# Patient Record
Sex: Male | Born: 1935 | Race: White | Hispanic: No | Marital: Married | Smoking: Former smoker
Health system: Southern US, Community
[De-identification: ages and names within clinical notes are randomized; demographics above are authoritative.]

## PROBLEM LIST (undated history)

## (undated) DIAGNOSIS — J40 Bronchitis, not specified as acute or chronic: Secondary | ICD-10-CM

## (undated) DIAGNOSIS — R404 Transient alteration of awareness: Secondary | ICD-10-CM

## (undated) DIAGNOSIS — Z9981 Dependence on supplemental oxygen: Secondary | ICD-10-CM

## (undated) DIAGNOSIS — I629 Nontraumatic intracranial hemorrhage, unspecified: Secondary | ICD-10-CM

## (undated) DIAGNOSIS — F329 Major depressive disorder, single episode, unspecified: Secondary | ICD-10-CM

## (undated) DIAGNOSIS — M810 Age-related osteoporosis without current pathological fracture: Secondary | ICD-10-CM

## (undated) DIAGNOSIS — Z973 Presence of spectacles and contact lenses: Secondary | ICD-10-CM

## (undated) DIAGNOSIS — D179 Benign lipomatous neoplasm, unspecified: Secondary | ICD-10-CM

## (undated) DIAGNOSIS — I4891 Unspecified atrial fibrillation: Secondary | ICD-10-CM

## (undated) DIAGNOSIS — M4850XA Collapsed vertebra, not elsewhere classified, site unspecified, initial encounter for fracture: Secondary | ICD-10-CM

## (undated) DIAGNOSIS — F32A Depression, unspecified: Secondary | ICD-10-CM

## (undated) DIAGNOSIS — M1990 Unspecified osteoarthritis, unspecified site: Secondary | ICD-10-CM

## (undated) DIAGNOSIS — K219 Gastro-esophageal reflux disease without esophagitis: Secondary | ICD-10-CM

## (undated) DIAGNOSIS — G609 Hereditary and idiopathic neuropathy, unspecified: Secondary | ICD-10-CM

## (undated) DIAGNOSIS — G629 Polyneuropathy, unspecified: Secondary | ICD-10-CM

## (undated) DIAGNOSIS — R413 Other amnesia: Secondary | ICD-10-CM

## (undated) DIAGNOSIS — G4733 Obstructive sleep apnea (adult) (pediatric): Secondary | ICD-10-CM

## (undated) DIAGNOSIS — J449 Chronic obstructive pulmonary disease, unspecified: Secondary | ICD-10-CM

## (undated) DIAGNOSIS — M797 Fibromyalgia: Secondary | ICD-10-CM

## (undated) DIAGNOSIS — I482 Chronic atrial fibrillation, unspecified: Secondary | ICD-10-CM

## (undated) DIAGNOSIS — E119 Type 2 diabetes mellitus without complications: Secondary | ICD-10-CM

## (undated) DIAGNOSIS — G4730 Sleep apnea, unspecified: Secondary | ICD-10-CM

## (undated) DIAGNOSIS — H269 Unspecified cataract: Secondary | ICD-10-CM

## (undated) DIAGNOSIS — E039 Hypothyroidism, unspecified: Secondary | ICD-10-CM

## (undated) DIAGNOSIS — I4819 Other persistent atrial fibrillation: Secondary | ICD-10-CM

## (undated) DIAGNOSIS — G4700 Insomnia, unspecified: Secondary | ICD-10-CM

## (undated) DIAGNOSIS — R0902 Hypoxemia: Secondary | ICD-10-CM

## (undated) DIAGNOSIS — I10 Essential (primary) hypertension: Secondary | ICD-10-CM

## (undated) DIAGNOSIS — M75102 Unspecified rotator cuff tear or rupture of left shoulder, not specified as traumatic: Secondary | ICD-10-CM

## (undated) DIAGNOSIS — N400 Enlarged prostate without lower urinary tract symptoms: Secondary | ICD-10-CM

## (undated) HISTORY — PX: VASECTOMY: SHX75

## (undated) HISTORY — DX: Age-related osteoporosis without current pathological fracture: M81.0

## (undated) HISTORY — DX: Other persistent atrial fibrillation: I48.19

## (undated) HISTORY — PX: CATARACT EXTRACTION: SUR2

## (undated) HISTORY — DX: Major depressive disorder, single episode, unspecified: F32.9

## (undated) HISTORY — DX: Transient alteration of awareness: R40.4

## (undated) HISTORY — DX: Obstructive sleep apnea (adult) (pediatric): G47.33

## (undated) HISTORY — PX: OTHER SURGICAL HISTORY: SHX169

## (undated) HISTORY — PX: CARPAL TUNNEL RELEASE: SHX101

## (undated) HISTORY — DX: Other amnesia: R41.3

## (undated) HISTORY — PX: INGUINAL HERNIA REPAIR: SUR1180

## (undated) HISTORY — PX: CHOLECYSTECTOMY: SHX55

## (undated) HISTORY — PX: SHOULDER ARTHROSCOPY W/ ROTATOR CUFF REPAIR: SHX2400

## (undated) HISTORY — PX: APPENDECTOMY: SHX54

## (undated) HISTORY — DX: Chronic atrial fibrillation, unspecified: I48.20

## (undated) HISTORY — PX: PROSTATE SURGERY: SHX751

## (undated) HISTORY — DX: Unspecified atrial fibrillation: I48.91

## (undated) HISTORY — DX: Hereditary and idiopathic neuropathy, unspecified: G60.9

## (undated) HISTORY — DX: Depression, unspecified: F32.A

## (undated) HISTORY — PX: NASAL CONCHA BULLOSA RESECTION: SHX2062

## (undated) HISTORY — DX: Unspecified cataract: H26.9

## (undated) HISTORY — DX: Nontraumatic intracranial hemorrhage, unspecified: I62.9

## (undated) HISTORY — PX: EYE SURGERY: SHX253

---

## 2011-05-05 LAB — PULMONARY FUNCTION TEST

## 2011-06-18 ENCOUNTER — Ambulatory Visit (INDEPENDENT_AMBULATORY_CARE_PROVIDER_SITE_OTHER): Payer: Medicare Other

## 2011-06-18 DIAGNOSIS — J189 Pneumonia, unspecified organism: Secondary | ICD-10-CM | POA: Diagnosis not present

## 2011-06-18 DIAGNOSIS — J449 Chronic obstructive pulmonary disease, unspecified: Secondary | ICD-10-CM | POA: Diagnosis not present

## 2011-06-18 DIAGNOSIS — M25519 Pain in unspecified shoulder: Secondary | ICD-10-CM | POA: Diagnosis not present

## 2011-06-18 DIAGNOSIS — J159 Unspecified bacterial pneumonia: Secondary | ICD-10-CM | POA: Diagnosis not present

## 2011-06-20 ENCOUNTER — Ambulatory Visit (INDEPENDENT_AMBULATORY_CARE_PROVIDER_SITE_OTHER): Payer: Medicare Other

## 2011-06-20 DIAGNOSIS — J449 Chronic obstructive pulmonary disease, unspecified: Secondary | ICD-10-CM | POA: Diagnosis not present

## 2011-06-20 DIAGNOSIS — J159 Unspecified bacterial pneumonia: Secondary | ICD-10-CM | POA: Diagnosis not present

## 2011-06-20 DIAGNOSIS — M25519 Pain in unspecified shoulder: Secondary | ICD-10-CM | POA: Diagnosis not present

## 2011-06-20 DIAGNOSIS — J189 Pneumonia, unspecified organism: Secondary | ICD-10-CM | POA: Diagnosis not present

## 2011-06-23 ENCOUNTER — Ambulatory Visit (INDEPENDENT_AMBULATORY_CARE_PROVIDER_SITE_OTHER): Payer: Medicare Other

## 2011-06-23 DIAGNOSIS — G479 Sleep disorder, unspecified: Secondary | ICD-10-CM

## 2011-06-23 DIAGNOSIS — J449 Chronic obstructive pulmonary disease, unspecified: Secondary | ICD-10-CM

## 2011-06-23 DIAGNOSIS — J189 Pneumonia, unspecified organism: Secondary | ICD-10-CM | POA: Diagnosis not present

## 2011-06-26 DIAGNOSIS — M25519 Pain in unspecified shoulder: Secondary | ICD-10-CM | POA: Diagnosis not present

## 2011-07-01 ENCOUNTER — Ambulatory Visit (INDEPENDENT_AMBULATORY_CARE_PROVIDER_SITE_OTHER): Payer: Medicare Other

## 2011-07-01 DIAGNOSIS — J209 Acute bronchitis, unspecified: Secondary | ICD-10-CM

## 2011-07-01 DIAGNOSIS — R059 Cough, unspecified: Secondary | ICD-10-CM | POA: Diagnosis not present

## 2011-07-01 DIAGNOSIS — R05 Cough: Secondary | ICD-10-CM | POA: Diagnosis not present

## 2011-07-03 DIAGNOSIS — M19019 Primary osteoarthritis, unspecified shoulder: Secondary | ICD-10-CM | POA: Diagnosis not present

## 2011-07-07 ENCOUNTER — Ambulatory Visit (INDEPENDENT_AMBULATORY_CARE_PROVIDER_SITE_OTHER): Payer: Medicare Other

## 2011-07-07 DIAGNOSIS — R071 Chest pain on breathing: Secondary | ICD-10-CM | POA: Diagnosis not present

## 2011-07-07 DIAGNOSIS — M8448XA Pathological fracture, other site, initial encounter for fracture: Secondary | ICD-10-CM

## 2011-07-07 DIAGNOSIS — J189 Pneumonia, unspecified organism: Secondary | ICD-10-CM | POA: Diagnosis not present

## 2011-07-13 DIAGNOSIS — S43429A Sprain of unspecified rotator cuff capsule, initial encounter: Secondary | ICD-10-CM | POA: Diagnosis not present

## 2011-07-16 DIAGNOSIS — M75120 Complete rotator cuff tear or rupture of unspecified shoulder, not specified as traumatic: Secondary | ICD-10-CM | POA: Diagnosis not present

## 2011-07-16 DIAGNOSIS — M25519 Pain in unspecified shoulder: Secondary | ICD-10-CM | POA: Diagnosis not present

## 2011-07-18 ENCOUNTER — Ambulatory Visit (INDEPENDENT_AMBULATORY_CARE_PROVIDER_SITE_OTHER): Payer: Medicare Other | Admitting: Family Medicine

## 2011-07-18 VITALS — BP 132/70 | HR 64 | Temp 98.0°F | Resp 16 | Ht 66.5 in | Wt 198.4 lb

## 2011-07-18 DIAGNOSIS — R062 Wheezing: Secondary | ICD-10-CM

## 2011-07-18 DIAGNOSIS — J449 Chronic obstructive pulmonary disease, unspecified: Secondary | ICD-10-CM | POA: Diagnosis not present

## 2011-07-18 DIAGNOSIS — J40 Bronchitis, not specified as acute or chronic: Secondary | ICD-10-CM

## 2011-07-18 DIAGNOSIS — R059 Cough, unspecified: Secondary | ICD-10-CM

## 2011-07-18 DIAGNOSIS — R05 Cough: Secondary | ICD-10-CM

## 2011-07-18 DIAGNOSIS — J4489 Other specified chronic obstructive pulmonary disease: Secondary | ICD-10-CM

## 2011-07-18 MED ORDER — HYDROCODONE-HOMATROPINE 5-1.5 MG/5ML PO SYRP: 5.0000 mL | ORAL_SOLUTION | Freq: Three times a day (TID) | ORAL | Status: AC | PRN

## 2011-07-18 MED ORDER — METHYLPREDNISOLONE SODIUM SUCC 125 MG IJ SOLR
125.0000 mg | Freq: Once | INTRAMUSCULAR | Status: AC
  Administered 2011-07-18: 125 mg via INTRAMUSCULAR

## 2011-07-18 MED ORDER — IPRATROPIUM BROMIDE 0.02 % IN SOLN
0.5000 mg | Freq: Once | RESPIRATORY_TRACT | Status: AC
  Administered 2011-07-18: 0.5 mg via RESPIRATORY_TRACT

## 2011-07-18 MED ORDER — IPRATROPIUM-ALBUTEROL 0.5-2.5 (3) MG/3ML IN SOLN: 3.0000 mL | RESPIRATORY_TRACT | Status: DC | PRN

## 2011-07-18 MED ORDER — ALBUTEROL SULFATE (2.5 MG/3ML) 0.083% IN NEBU
2.5000 mg | INHALATION_SOLUTION | Freq: Once | RESPIRATORY_TRACT | Status: AC
  Administered 2011-07-18: 2.5 mg via RESPIRATORY_TRACT

## 2011-07-18 MED ORDER — PREDNISONE 20 MG PO TABS: ORAL_TABLET | ORAL | Status: DC

## 2011-07-18 NOTE — Patient Instructions (Signed)
Patient was instructed verbally with regard to he has use of the nebulizer machine. He is to use it in place of the Combivent and he can use one or the other not both he is to return in one week

## 2011-07-18 NOTE — Progress Notes (Signed)
  Subjective:    Patient ID: Jonathan [name], male    DOB: Jul 21, 1932, 78 y.o.   MRN: [id number]  HPI has been having this cough for a month now. He has gone through 2 courses of antibiotics and been seen on multiple occasions. He did have a pneumonia, but the last x-ray looked much better on that. He is constantly coughing. He is scheduled to go back to [place] in about 10 days. He is scheduled to go to the beach tomorrow he is not running a fever. He has some productivity of the cough. He just finished his antibiotics one to 2 days ago. He coughs so much that he gets short of breath, but he has a pulse oximeter at home. The home pulse ox runs about 95. Denies any chest pains except if he coughs too hard.    Review of Systems   he has no headache. He has mild congestion of his nose. He has chronic postnasal drainage which she's had for years, but this is no worse than usual. No new ankle edema Objective:   Physical Exam  constant cough. He goes into spasm since it is hard to breathe at times. Normal throat was clear neck supple without nodes or thyromegaly chest has a soft end expiratory wheeze very poor air movement no rales or rhonchi. No edema.       Assessment & Plan:  COPD with exacerbation. Chronic cough with asthmatic bronchitis  . Patient had only a 200 peak flow here in the office. 3 weeks ago it was 270. He was given a hand-held nebulizer of albuterol and Atrovent. He improved at least to 40, but he kept coughing too much to adequately do the test. He was given an injection of 125 mg Solu-Medrol. He will be given a nebulizer machine at home.

## 2011-07-20 ENCOUNTER — Inpatient Hospital Stay (HOSPITAL_COMMUNITY)
Admission: EM | Admit: 2011-07-20 | Discharge: 2011-07-21 | DRG: 308 | Disposition: A | Discharge status: Home / Self Care | Payer: Medicare Other | Attending: Internal Medicine | Admitting: Internal Medicine

## 2011-07-20 ENCOUNTER — Encounter (HOSPITAL_COMMUNITY): Payer: Self-pay | Admitting: Emergency Medicine

## 2011-07-20 ENCOUNTER — Other Ambulatory Visit: Payer: Self-pay

## 2011-07-20 ENCOUNTER — Emergency Department (HOSPITAL_COMMUNITY): Payer: Medicare Other

## 2011-07-20 DIAGNOSIS — Z9981 Dependence on supplemental oxygen: Secondary | ICD-10-CM | POA: Diagnosis not present

## 2011-07-20 DIAGNOSIS — I129 Hypertensive chronic kidney disease with stage 1 through stage 4 chronic kidney disease, or unspecified chronic kidney disease: Secondary | ICD-10-CM | POA: Diagnosis present

## 2011-07-20 DIAGNOSIS — R079 Chest pain, unspecified: Secondary | ICD-10-CM | POA: Diagnosis present

## 2011-07-20 DIAGNOSIS — I4891 Unspecified atrial fibrillation: Secondary | ICD-10-CM | POA: Diagnosis not present

## 2011-07-20 DIAGNOSIS — R05 Cough: Secondary | ICD-10-CM

## 2011-07-20 DIAGNOSIS — E039 Hypothyroidism, unspecified: Secondary | ICD-10-CM | POA: Diagnosis present

## 2011-07-20 DIAGNOSIS — J449 Chronic obstructive pulmonary disease, unspecified: Secondary | ICD-10-CM | POA: Diagnosis not present

## 2011-07-20 DIAGNOSIS — N189 Chronic kidney disease, unspecified: Secondary | ICD-10-CM | POA: Diagnosis present

## 2011-07-20 DIAGNOSIS — R0602 Shortness of breath: Secondary | ICD-10-CM | POA: Diagnosis not present

## 2011-07-20 DIAGNOSIS — R059 Cough, unspecified: Secondary | ICD-10-CM

## 2011-07-20 DIAGNOSIS — D696 Thrombocytopenia, unspecified: Secondary | ICD-10-CM | POA: Diagnosis not present

## 2011-07-20 DIAGNOSIS — R918 Other nonspecific abnormal finding of lung field: Secondary | ICD-10-CM | POA: Diagnosis not present

## 2011-07-20 DIAGNOSIS — J4489 Other specified chronic obstructive pulmonary disease: Secondary | ICD-10-CM

## 2011-07-20 DIAGNOSIS — R7989 Other specified abnormal findings of blood chemistry: Secondary | ICD-10-CM | POA: Diagnosis not present

## 2011-07-20 DIAGNOSIS — J40 Bronchitis, not specified as acute or chronic: Secondary | ICD-10-CM

## 2011-07-20 DIAGNOSIS — J189 Pneumonia, unspecified organism: Secondary | ICD-10-CM | POA: Diagnosis not present

## 2011-07-20 DIAGNOSIS — I480 Paroxysmal atrial fibrillation: Secondary | ICD-10-CM

## 2011-07-20 HISTORY — DX: Essential (primary) hypertension: I10

## 2011-07-20 HISTORY — DX: Chronic obstructive pulmonary disease, unspecified: J44.9

## 2011-07-20 HISTORY — DX: Unspecified rotator cuff tear or rupture of left shoulder, not specified as traumatic: M75.102

## 2011-07-20 HISTORY — DX: Collapsed vertebra, not elsewhere classified, site unspecified, initial encounter for fracture: M48.50XA

## 2011-07-20 HISTORY — DX: Polyneuropathy, unspecified: G62.9

## 2011-07-20 HISTORY — DX: Insomnia, unspecified: G47.00

## 2011-07-20 HISTORY — DX: Hypothyroidism, unspecified: E03.9

## 2011-07-20 HISTORY — DX: Hypoxemia: R09.02

## 2011-07-20 HISTORY — DX: Benign lipomatous neoplasm, unspecified: D17.9

## 2011-07-20 HISTORY — DX: Benign prostatic hyperplasia without lower urinary tract symptoms: N40.0

## 2011-07-20 HISTORY — DX: Unspecified osteoarthritis, unspecified site: M19.90

## 2011-07-20 HISTORY — DX: Bronchitis, not specified as acute or chronic: J40

## 2011-07-20 HISTORY — DX: Fibromyalgia: M79.7

## 2011-07-20 HISTORY — DX: Gastro-esophageal reflux disease without esophagitis: K21.9

## 2011-07-20 LAB — POCT I-STAT TROPONIN I: Troponin i, poc: 0.01 ng/mL (ref 0.00–0.08)

## 2011-07-20 LAB — CBC
HCT: 36.4 % — ABNORMAL LOW (ref 39.0–52.0)
Hemoglobin: 12.1 g/dL — ABNORMAL LOW (ref 13.0–17.0)
MCH: 31.3 pg (ref 26.0–34.0)
MCHC: 33.2 g/dL (ref 30.0–36.0)
MCV: 94.3 fL (ref 78.0–100.0)
Platelets: 149 10*3/uL — ABNORMAL LOW (ref 150–400)
RBC: 3.86 MIL/uL — ABNORMAL LOW (ref 4.22–5.81)
RDW: 14.5 % (ref 11.5–15.5)
WBC: 13.3 10*3/uL — ABNORMAL HIGH (ref 4.0–10.5)

## 2011-07-20 LAB — GLUCOSE, CAPILLARY: Glucose-Capillary: 113 mg/dL — ABNORMAL HIGH (ref 70–99)

## 2011-07-20 LAB — POCT I-STAT, CHEM 8
BUN: 55 mg/dL — ABNORMAL HIGH (ref 6–23)
Calcium, Ion: 1.22 mmol/L (ref 1.12–1.32)
Chloride: 110 mEq/L (ref 96–112)
Creatinine, Ser: 1.6 mg/dL — ABNORMAL HIGH (ref 0.50–1.35)
Glucose, Bld: 227 mg/dL — ABNORMAL HIGH (ref 70–99)
HCT: 38 % — ABNORMAL LOW (ref 39.0–52.0)
Hemoglobin: 12.9 g/dL — ABNORMAL LOW (ref 13.0–17.0)
Potassium: 4 mEq/L (ref 3.5–5.1)
Sodium: 140 mEq/L (ref 135–145)
TCO2: 20 mmol/L (ref 0–100)

## 2011-07-20 LAB — COMPREHENSIVE METABOLIC PANEL
ALT: 21 U/L (ref 0–53)
AST: 24 U/L (ref 0–37)
Albumin: 3.9 g/dL (ref 3.5–5.2)
Alkaline Phosphatase: 75 U/L (ref 39–117)
BUN: 55 mg/dL — ABNORMAL HIGH (ref 6–23)
CO2: 21 mEq/L (ref 19–32)
Calcium: 9.2 mg/dL (ref 8.4–10.5)
Chloride: 102 mEq/L (ref 96–112)
Creatinine, Ser: 1.66 mg/dL — ABNORMAL HIGH (ref 0.50–1.35)
GFR calc Af Amer: 45 mL/min — ABNORMAL LOW (ref >90)
GFR calc non Af Amer: 38 mL/min — ABNORMAL LOW (ref >90)
Glucose, Bld: 228 mg/dL — ABNORMAL HIGH (ref 70–99)
Potassium: 4 mEq/L (ref 3.5–5.1)
Sodium: 138 mEq/L (ref 135–145)
Total Bilirubin: 0.2 mg/dL — ABNORMAL LOW (ref 0.3–1.2)
Total Protein: 6.6 g/dL (ref 6.0–8.3)

## 2011-07-20 LAB — PROTIME-INR
INR: 1 (ref 0.00–1.49)
Prothrombin Time: 13.4 seconds (ref 11.6–15.2)

## 2011-07-20 LAB — CARDIAC PANEL(CRET KIN+CKTOT+MB+TROPI)
CK, MB: 8.7 ng/mL (ref 0.3–4.0)
Relative Index: 2.6 — ABNORMAL HIGH (ref 0.0–2.5)
Total CK: 333 U/L — ABNORMAL HIGH (ref 7–232)
Troponin I: <0.3 ng/mL (ref <0.30)

## 2011-07-20 LAB — CREATININE, SERUM
Creatinine, Ser: 1.56 mg/dL — ABNORMAL HIGH (ref 0.50–1.35)
GFR calc Af Amer: 48 mL/min — ABNORMAL LOW (ref >90)
GFR calc non Af Amer: 41 mL/min — ABNORMAL LOW (ref >90)

## 2011-07-20 LAB — APTT: aPTT: 25 seconds (ref 24–37)

## 2011-07-20 LAB — D-DIMER, QUANTITATIVE (NOT AT ARMC): D-Dimer, Quant: 0.32 ug/mL-FEU (ref 0.00–0.48)

## 2011-07-20 MED ORDER — PREGABALIN 50 MG PO CAPS
75.0000 mg | ORAL_CAPSULE | ORAL | Status: DC
  Administered 2011-07-21: 75 mg via ORAL
  Filled 2011-07-20: qty 3

## 2011-07-20 MED ORDER — INSULIN ASPART 100 UNIT/ML ~~LOC~~ SOLN
0.0000 [IU] | Freq: Three times a day (TID) | SUBCUTANEOUS | Status: DC
  Filled 2011-07-20: qty 3

## 2011-07-20 MED ORDER — PANTOPRAZOLE SODIUM 40 MG PO TBEC
40.0000 mg | DELAYED_RELEASE_TABLET | Freq: Every day | ORAL | Status: DC
  Administered 2011-07-21: 40 mg via ORAL
  Filled 2011-07-20: qty 1

## 2011-07-20 MED ORDER — IBUPROFEN 400 MG PO TABS
400.0000 mg | ORAL_TABLET | Freq: Two times a day (BID) | ORAL | Status: DC | PRN
  Administered 2011-07-21: 400 mg via ORAL
  Filled 2011-07-20 (×2): qty 1

## 2011-07-20 MED ORDER — SODIUM CHLORIDE 0.9 % IV SOLN
INTRAVENOUS | Status: DC
  Administered 2011-07-20 – 2011-07-21 (×2): via INTRAVENOUS

## 2011-07-20 MED ORDER — DIPHENHYDRAMINE HCL (SLEEP) 25 MG PO TABS: 50.0000 mg | ORAL_TABLET | Freq: Every evening | ORAL | Status: DC | PRN

## 2011-07-20 MED ORDER — SODIUM CHLORIDE 0.9 % IV SOLN: 20.0000 mL | INTRAVENOUS | Status: DC

## 2011-07-20 MED ORDER — TEMAZEPAM 15 MG PO CAPS
15.0000 mg | ORAL_CAPSULE | Freq: Every evening | ORAL | Status: DC | PRN
  Administered 2011-07-20: 15 mg via ORAL
  Filled 2011-07-20: qty 1

## 2011-07-20 MED ORDER — FLUTICASONE PROPIONATE 50 MCG/ACT NA SUSP
2.0000 | Freq: Every day | NASAL | Status: DC
  Administered 2011-07-21: 2 via NASAL
  Filled 2011-07-20: qty 16

## 2011-07-20 MED ORDER — DEXTROSE 5 % IV SOLN
1.0000 g | INTRAVENOUS | Status: DC
  Administered 2011-07-20: 1 g via INTRAVENOUS
  Filled 2011-07-20: qty 10

## 2011-07-20 MED ORDER — OXYCODONE-ACETAMINOPHEN 7.5-325 MG PO TABS: 1.0000 | ORAL_TABLET | ORAL | Status: DC | PRN

## 2011-07-20 MED ORDER — ASPIRIN 81 MG PO CHEW
324.0000 mg | CHEWABLE_TABLET | Freq: Once | ORAL | Status: DC
  Filled 2011-07-20: qty 4

## 2011-07-20 MED ORDER — IPRATROPIUM BROMIDE 0.02 % IN SOLN
0.5000 mg | Freq: Four times a day (QID) | RESPIRATORY_TRACT | Status: DC
  Administered 2011-07-20 – 2011-07-21 (×2): 0.5 mg via RESPIRATORY_TRACT
  Filled 2011-07-20 (×4): qty 2.5

## 2011-07-20 MED ORDER — ONDANSETRON HCL 4 MG/2ML IJ SOLN: 4.0000 mg | Freq: Four times a day (QID) | INTRAMUSCULAR | Status: DC | PRN

## 2011-07-20 MED ORDER — LEVALBUTEROL HCL 0.63 MG/3ML IN NEBU: 0.6300 mg | INHALATION_SOLUTION | Freq: Four times a day (QID) | RESPIRATORY_TRACT | Status: DC | PRN

## 2011-07-20 MED ORDER — ONDANSETRON HCL 4 MG PO TABS: 4.0000 mg | ORAL_TABLET | Freq: Four times a day (QID) | ORAL | Status: DC | PRN

## 2011-07-20 MED ORDER — MOXIFLOXACIN HCL IN NACL 400 MG/250ML IV SOLN
400.0000 mg | INTRAVENOUS | Status: DC
  Filled 2011-07-20: qty 250

## 2011-07-20 MED ORDER — PREGABALIN 50 MG PO CAPS
150.0000 mg | ORAL_CAPSULE | Freq: Every evening | ORAL | Status: DC
  Administered 2011-07-20: 150 mg via ORAL
  Filled 2011-07-20: qty 3

## 2011-07-20 MED ORDER — ALLOPURINOL 100 MG PO TABS
100.0000 mg | ORAL_TABLET | Freq: Two times a day (BID) | ORAL | Status: DC
  Administered 2011-07-20 – 2011-07-21 (×2): 100 mg via ORAL
  Filled 2011-07-20 (×3): qty 1

## 2011-07-20 MED ORDER — ACETAMINOPHEN 325 MG PO TABS: 650.0000 mg | ORAL_TABLET | Freq: Four times a day (QID) | ORAL | Status: DC | PRN

## 2011-07-20 MED ORDER — SODIUM CHLORIDE 0.9 % IV SOLN: INTRAVENOUS | Status: DC

## 2011-07-20 MED ORDER — ACETAMINOPHEN 650 MG RE SUPP: 650.0000 mg | Freq: Four times a day (QID) | RECTAL | Status: DC | PRN

## 2011-07-20 MED ORDER — PREGABALIN 50 MG PO CAPS: 75.0000 mg | ORAL_CAPSULE | Freq: Two times a day (BID) | ORAL | Status: DC

## 2011-07-20 MED ORDER — CALCIUM CARBONATE-VITAMIN D 500-200 MG-UNIT PO TABS
1.0000 | ORAL_TABLET | Freq: Every day | ORAL | Status: DC
  Administered 2011-07-20 – 2011-07-21 (×2): 1 via ORAL
  Filled 2011-07-20 (×2): qty 1

## 2011-07-20 MED ORDER — LISINOPRIL 20 MG PO TABS
20.0000 mg | ORAL_TABLET | Freq: Every day | ORAL | Status: DC
  Administered 2011-07-21: 20 mg via ORAL
  Filled 2011-07-20 (×2): qty 1

## 2011-07-20 MED ORDER — OXYCODONE-ACETAMINOPHEN 5-325 MG PO TABS: 1.5000 | ORAL_TABLET | ORAL | Status: DC | PRN

## 2011-07-20 MED ORDER — MOXIFLOXACIN HCL IN NACL 400 MG/250ML IV SOLN
400.0000 mg | Freq: Once | INTRAVENOUS | Status: AC
  Administered 2011-07-20: 400 mg via INTRAVENOUS
  Filled 2011-07-20: qty 250

## 2011-07-20 MED ORDER — SERTRALINE HCL 100 MG PO TABS
100.0000 mg | ORAL_TABLET | Freq: Every day | ORAL | Status: DC
  Filled 2011-07-20: qty 1

## 2011-07-20 MED ORDER — DOXAZOSIN MESYLATE 8 MG PO TABS
8.0000 mg | ORAL_TABLET | Freq: Every day | ORAL | Status: DC
  Administered 2011-07-20: 8 mg via ORAL
  Filled 2011-07-20 (×2): qty 1

## 2011-07-20 MED ORDER — LEVOTHYROXINE SODIUM 75 MCG PO TABS
75.0000 ug | ORAL_TABLET | Freq: Every day | ORAL | Status: DC
  Administered 2011-07-21: 75 ug via ORAL
  Filled 2011-07-20 (×2): qty 1

## 2011-07-20 MED ORDER — CYCLOBENZAPRINE HCL 10 MG PO TABS
10.0000 mg | ORAL_TABLET | Freq: Three times a day (TID) | ORAL | Status: DC | PRN
  Filled 2011-07-20: qty 1

## 2011-07-20 MED ORDER — VANCOMYCIN HCL 1000 MG IV SOLR
750.0000 mg | Freq: Two times a day (BID) | INTRAVENOUS | Status: DC
  Administered 2011-07-20 – 2011-07-21 (×2): 750 mg via INTRAVENOUS
  Filled 2011-07-20 (×3): qty 750

## 2011-07-20 MED ORDER — DIPHENHYDRAMINE HCL 25 MG PO CAPS: 50.0000 mg | ORAL_CAPSULE | Freq: Every evening | ORAL | Status: DC | PRN

## 2011-07-20 MED ORDER — ENOXAPARIN SODIUM 30 MG/0.3ML ~~LOC~~ SOLN
30.0000 mg | SUBCUTANEOUS | Status: DC
  Administered 2011-07-20: 30 mg via SUBCUTANEOUS
  Filled 2011-07-20 (×2): qty 0.3

## 2011-07-20 MED ORDER — BIOTENE DRY MOUTH MT LIQD
15.0000 mL | Freq: Two times a day (BID) | OROMUCOSAL | Status: DC
  Administered 2011-07-21: 15 mL via OROMUCOSAL

## 2011-07-20 MED ORDER — LATANOPROST 0.005 % OP SOLN
1.0000 [drp] | Freq: Every day | OPHTHALMIC | Status: DC
  Administered 2011-07-20: 1 [drp] via OPHTHALMIC
  Filled 2011-07-20: qty 2.5

## 2011-07-20 MED ORDER — SERTRALINE HCL 100 MG PO TABS
100.0000 mg | ORAL_TABLET | Freq: Every day | ORAL | Status: DC
  Administered 2011-07-20: 100 mg via ORAL
  Filled 2011-07-20: qty 1

## 2011-07-20 MED ORDER — LEVALBUTEROL HCL 0.63 MG/3ML IN NEBU
0.6300 mg | INHALATION_SOLUTION | Freq: Four times a day (QID) | RESPIRATORY_TRACT | Status: DC
  Administered 2011-07-21 (×2): 0.63 mg via RESPIRATORY_TRACT
  Filled 2011-07-20 (×7): qty 3

## 2011-07-20 MED ORDER — BUDESONIDE 0.5 MG/2ML IN SUSP
0.5000 mg | Freq: Two times a day (BID) | RESPIRATORY_TRACT | Status: DC
  Administered 2011-07-21: 0.5 mg via RESPIRATORY_TRACT
  Filled 2011-07-20 (×4): qty 2

## 2011-07-20 MED ORDER — ASPIRIN EC 325 MG PO TBEC
325.0000 mg | DELAYED_RELEASE_TABLET | Freq: Every day | ORAL | Status: DC
  Administered 2011-07-21: 325 mg via ORAL
  Filled 2011-07-20: qty 1

## 2011-07-20 NOTE — Progress Notes (Signed)
CRITICAL VALUE ALERT  Critical value received:  CK-MB 8.7   Date of notification:  07/20/11  Time of notification:  2300  Critical value read back:yes  Nurse who received alert:  Duwaine Maxin, RN  MD notified (1st page):  Maren Reamer, NP  Time of first page:  2300  MD notified (2nd page):  Time of second page:  Responding MD:  Maren Reamer, NP  Time MD responded:  [phone]

## 2011-07-20 NOTE — ED Notes (Signed)
Pt st's he has had Bronchitis since Jan 1.  Today started having chest pain across upper chest.    Pt also has productive cough.

## 2011-07-20 NOTE — ED Provider Notes (Signed)
History     CSN: [id number]  Arrival date & time 07/20/11  1721   First MD Initiated Contact with Patient 07/20/11 1746      Chief Complaint  Patient presents with  . Chest Pain    (Consider location/radiation/quality/duration/timing/severity/associated sxs/prior treatment) HPI Pt presents to the ED with complaints of palpitations.  He feels like it has been frequent since starting the nebulizer machine.  He has been on that for a few days.  He has been having pain in his chest with this.  Pt saw his primary doctor and was started on the nebulizer medication.  The pain in the chest was pressure like.  No radiation of the pain.  He did get short of breath with it when it occurs.  Pt denies history of CAD.   Past Medical History  Diagnosis Date  . Bronchitis   . COPD (chronic obstructive pulmonary disease)   . Hypertension     No past surgical history on file.  No family history on file.  History  Substance Use Topics  . Smoking status: Former Smoker    Quit date: 07/18/1975  . Smokeless tobacco: Not on file  . Alcohol Use: Not on file      Review of Systems  All other systems reviewed and are negative.    Allergies  Review of patient's allergies indicates no known allergies.  Home Medications   Current Outpatient Rx  Name Route Sig Dispense Refill  . IPRATROPIUM-ALBUTEROL 18-103 MCG/ACT IN AERO Inhalation Inhale 2 puffs into the lungs every 6 (six) hours as needed. For shortness of breath    . ALLOPURINOL 100 MG PO TABS Oral Take 100 mg by mouth 2 (two) times daily.    . ASPIRIN 81 MG PO TABS Oral Take 160 mg by mouth daily.    Marland Kitchen BENZONATATE 100 MG PO CAPS Oral Take 100 mg by mouth 3 (three) times daily as needed. 1 to 2 pills tid    . CALCIUM CARBONATE-VITAMIN D 500-200 MG-UNIT PO TABS Oral Take 1 tablet by mouth daily.    Marland Kitchen CEFDINIR 300 MG PO CAPS Oral Take 300 mg by mouth 2 (two) times daily.    . CYCLOBENZAPRINE HCL 10 MG PO TABS Oral Take 10 mg by mouth 3  (three) times daily as needed.    Marland Kitchen DIPHENHYDRAMINE HCL (SLEEP) 25 MG PO TABS Oral Take 50 mg by mouth at bedtime as needed.    Marland Kitchen DOXAZOSIN MESYLATE 8 MG PO TABS Oral Take 8 mg by mouth at bedtime.    Marland Kitchen GLUCOSAMINE-CHONDROITIN 500-400 MG PO TABS Oral Take 1 tablet by mouth 3 (three) times daily.    Marland Kitchen HYDROCODONE-HOMATROPINE 5-1.5 MG/5ML PO SYRP Oral Take 5 mLs by mouth every 8 (eight) hours as needed for cough. 240 mL 1  . IBUPROFEN 400 MG PO TABS Oral Take 400 mg by mouth every 6 (six) hours as needed.    . IPRATROPIUM-ALBUTEROL 0.5-2.5 (3) MG/3ML IN SOLN Nebulization Take 3 mLs by nebulization every 4 (four) hours as needed. 3 mL 3    Prescribe 2 boxes with 3 refills  . LANSOPRAZOLE 30 MG PO CPDR Oral Take 30 mg by mouth every evening.    Marland Kitchen LATANOPROST 0.005 % OP SOLN  1 drop at bedtime.    Marland Kitchen LEVOFLOXACIN 500 MG PO TABS Oral Take 500 mg by mouth daily.    Marland Kitchen LEVOTHYROXINE SODIUM 75 MCG PO TABS Oral Take 75 mcg by mouth daily.    Marland Kitchen LISINOPRIL-HYDROCHLOROTHIAZIDE  20-25 MG PO TABS Oral Take 1 tablet by mouth daily.    Marland Kitchen MAGNESIUM HYDROXIDE 400 MG/5ML PO SUSP Oral Take by mouth daily as needed.    . MOMETASONE FUROATE 50 MCG/ACT NA SUSP Nasal Place 2 sprays into the nose daily.    . OXYCODONE-ACETAMINOPHEN 7.5-325 MG PO TABS Oral Take 1 tablet by mouth every 4 (four) hours as needed.    Marland Kitchen PREDNISONE 20 MG PO TABS  3 daily for 2 days, 2 daily for 2 days, one daily for 2 days 12 tablet 0  . PREGABALIN 150 MG PO CAPS Oral Take 150 mg by mouth every evening.    Marland Kitchen PREGABALIN 75 MG PO CAPS Oral Take 75 mg by mouth 2 (two) times daily.    . SERTRALINE HCL 100 MG PO TABS Oral Take 100 mg by mouth daily.    Marland Kitchen TADALAFIL 20 MG PO TABS Oral Take 20 mg by mouth daily as needed.    Marland Kitchen TEMAZEPAM 15 MG PO CAPS Oral Take 15 mg by mouth at bedtime as needed.    Marland Kitchen ZALEPLON 10 MG PO CAPS Oral Take 10 mg by mouth at bedtime.    Marland Kitchen ZOLPIDEM TARTRATE 10 MG PO TABS Oral Take 10 mg by mouth at bedtime as needed.      BP  83/66  Pulse 170  Temp(Src) 98.3 F (36.8 C) (Oral)  Resp 20  SpO2 94%  Physical Exam  Nursing note and vitals reviewed. Constitutional: He appears well-developed and well-nourished. No distress.  HENT:  Head: Normocephalic and atraumatic.  Right Ear: External ear normal.  Left Ear: External ear normal.  Eyes: Conjunctivae are normal. Right eye exhibits no discharge. Left eye exhibits no discharge. No scleral icterus.  Neck: Neck supple. No tracheal deviation present.  Cardiovascular: Normal rate, regular rhythm and intact distal pulses.   Pulmonary/Chest: Effort normal and breath sounds normal. No stridor. No respiratory distress. He has no wheezes. He has no rales.  Abdominal: Soft. Bowel sounds are normal. He exhibits no distension. There is no tenderness. There is no rebound and no guarding.  Musculoskeletal: He exhibits no edema and no tenderness.  Neurological: He is alert. He has normal strength. No sensory deficit. Cranial nerve deficit:  no gross defecits noted. He exhibits normal muscle tone. He displays no seizure activity. Coordination normal.  Skin: Skin is warm and dry. No rash noted.  Psychiatric: He has a normal mood and affect.    ED Course  Procedures (including critical care time)  Date: 07/20/2011  Rate: 161  Rhythm: atrial fibrillation  QRS Axis: normal  Intervals: normal  ST/T Wave abnormalities: normal  Conduction Disutrbances:none  Narrative Interpretation:   Old EKG Reviewed: changes noted 2nd EKG with heart rate 106, nl rhythm  Labs Reviewed  CBC - Abnormal; Notable for the following:    WBC 13.3 (*)    RBC 3.86 (*)    Hemoglobin 12.1 (*)    HCT 36.4 (*)    Platelets 149 (*)    All other components within normal limits  COMPREHENSIVE METABOLIC PANEL - Abnormal; Notable for the following:    Glucose, Bld 228 (*)    BUN 55 (*)    Creatinine, Ser 1.66 (*)    Total Bilirubin 0.2 (*)    GFR calc non Af Amer 38 (*)    GFR calc Af Amer 45 (*)     All other components within normal limits  POCT I-STAT, CHEM 8 - Abnormal; Notable for the  following:    BUN 55 (*)    Creatinine, Ser 1.60 (*)    Glucose, Bld 227 (*)    Hemoglobin 12.9 (*)    HCT 38.0 (*)    All other components within normal limits  PROTIME-INR  APTT  POCT I-STAT TROPONIN I   Dg Chest Portable 1 View  07/20/2011  *RADIOLOGY REPORT*  Clinical Data: Chest pain.  Cough.  Congestion.  PORTABLE CHEST - 1 VIEW  Comparison: None.  Findings: Artifact overlies chest.  Heart size is normal. Mediastinal shadows are normal.  There is bibasilar atelectasis and/or infiltrate, left more than right.  No effusions.  No evidence of heart failure.  IMPRESSION: Likely bilateral lower lobe pneumonia left worse than right.  Original Report Authenticated By: Thomasenia Sales, M.D.     1. CAP (community acquired pneumonia)   2. Paroxysmal atrial fibrillation       MDM  Patient had an episode of paroxysmal atrial fibrillation but subsequently resolved.  Patient has been having trouble with cough and has been treated with antibiotics for a bronchitis. His chest x-ray today suggesting a bilateral pneumonia.  The patient has already been on outpatient antibiotics and has failed treatment. At this point I will get him to the hospital for further treatment and evaluation.        Celene Kras, MD 07/20/11 [phone]

## 2011-07-20 NOTE — H&P (Signed)
Jonathan [name] is an 78 y.o. male.  PCP - Dr.William Hopper in [place]. Patient also frequently travels to [place].  Chief Complaint: Chest pain and increased heart rate. HPI: 78 year-old male with known history of COPD on home oxygen usually uses 2 L oxygen, hypertension, gout presented to the ER because of chest pain and increased heart rate. Patient states around 6 PM this evening patient started having some chest pain left-sided it as pressure-like nonradiating. At the time he checked his pulse oximetry which showed a heart rate of 167. Patient came to the ER. Patient states his chest pain lasted for about 45 minutes and that got better when he came to the ER. Patient was in A. fib with RVR but he came to the ER but spontaneously reverted back to sinus rhythm. Patient chest x-ray showed bilateral pneumonia and presently is not in acute distress. Patient presently has no chest pain. Patient has been extremely cough with phlegm over the last one month and had multiple course of antibiotics and last was over just 3 days ago with a tapering dose of steroids just over today. Patient denies any nausea vomiting abdominal pain dysuria or discharge his diarrhea and dizziness loss of consciousness or any focal deficits.  Past Medical History  Diagnosis Date  . Bronchitis   . COPD (chronic obstructive pulmonary disease)   . Hypertension     Past Surgical History  Procedure Date  . Cholecystectomy   . Knee surgery     Family History  Problem Relation Age of Onset  . Coronary artery disease Brother    Social History:  reports that he quit smoking about 36 years ago. He does not have any smokeless tobacco history on file. He reports that he drinks alcohol. He reports that he does not use illicit drugs.  Allergies: No Known Allergies  Medications Prior to Admission  Medication Dose Route Frequency Provider Last Rate Last Dose  . 0.9 %  sodium chloride infusion   Intravenous Continuous Celene Kras, MD      . aspirin chewable tablet 324 mg  324 mg Oral Once Celene Kras, MD      . cefTRIAXone (ROCEPHIN) 1 g in dextrose 5 % 50 mL IVPB  1 g Intravenous Q24H Celene Kras, MD   1 g at 07/20/11 1940  . moxifloxacin (AVELOX) IVPB 400 mg  400 mg Intravenous Once Celene Kras, MD   400 mg at 07/20/11 2030  . DISCONTD: 0.9 %  sodium chloride infusion  20 mL Intravenous Continuous Celene Kras, MD       Medications Prior to Admission  Medication Sig Dispense Refill  . albuterol-ipratropium (COMBIVENT) 18-103 MCG/ACT inhaler Inhale 2 puffs into the lungs every 6 (six) hours as needed. For shortness of breath      . allopurinol (ZYLOPRIM) 100 MG tablet Take 100 mg by mouth 2 (two) times daily.      Marland Kitchen aspirin 81 MG tablet Take 81 mg by mouth every evening.       . benzonatate (TESSALON) 100 MG capsule Take 100 mg by mouth 3 (three) times daily as needed. For cough      . calcium-vitamin D (OSCAL WITH D) 500-200 MG-UNIT per tablet Take 1 tablet by mouth daily.      . cefdinir (OMNICEF) 300 MG capsule Take 300 mg by mouth 2 (two) times daily.      . cyclobenzaprine (FLEXERIL) 10 MG tablet Take 10 mg by mouth  3 (three) times daily as needed. For muscle spasms      . diphenhydrAMINE (SOMINEX) 25 MG tablet Take 50 mg by mouth at bedtime as needed. For sleep      . doxazosin (CARDURA) 8 MG tablet Take 8 mg by mouth at bedtime.      Marland Kitchen glucosamine-chondroitin 500-400 MG tablet Take 1 tablet by mouth 3 (three) times daily.      Marland Kitchen HYDROcodone-homatropine (HYCODAN) 5-1.5 MG/5ML syrup Take 5 mLs by mouth every 8 (eight) hours as needed for cough.  240 mL  1  . ibuprofen (ADVIL,MOTRIN) 400 MG tablet Take 400 mg by mouth every 6 (six) hours as needed. For pain      . ipratropium-albuterol (DUONEB) 0.5-2.5 (3) MG/3ML SOLN Take 3 mLs by nebulization every 4 (four) hours as needed. For shortness of breath      . lansoprazole (PREVACID) 30 MG capsule Take 30 mg by mouth every evening.      . latanoprost (XALATAN)  0.005 % ophthalmic solution Place 1 drop into both eyes at bedtime.       Marland Kitchen levofloxacin (LEVAQUIN) 500 MG tablet Take 500 mg by mouth daily.      Marland Kitchen levothyroxine (SYNTHROID, LEVOTHROID) 75 MCG tablet Take 75 mcg by mouth daily.      Marland Kitchen lisinopril-hydrochlorothiazide (PRINZIDE,ZESTORETIC) 20-25 MG per tablet Take 1 tablet by mouth daily.      . magnesium hydroxide (MILK OF MAGNESIA) 400 MG/5ML suspension Take 5 mLs by mouth daily as needed. For upset stomach      . mometasone (NASONEX) 50 MCG/ACT nasal spray Place 2 sprays into the nose daily.      Marland Kitchen oxyCODONE-acetaminophen (PERCOCET) 7.5-325 MG per tablet Take 1 tablet by mouth every 4 (four) hours as needed. For pain      . predniSONE (DELTASONE) 20 MG tablet Take 20-60 mg by mouth daily. Start date unknown; 3 daily for 2 days, 2 daily for 2 days, one daily for 2 days      . pregabalin (LYRICA) 150 MG capsule Take 150 mg by mouth every evening.      . pregabalin (LYRICA) 75 MG capsule Take 75 mg by mouth 2 (two) times daily.      . sertraline (ZOLOFT) 100 MG tablet Take 100 mg by mouth daily.      . tadalafil (CIALIS) 20 MG tablet Take 20 mg by mouth daily as needed. For erectile dysfunction      . temazepam (RESTORIL) 15 MG capsule Take 15 mg by mouth at bedtime as needed. For sleep      . zaleplon (SONATA) 10 MG capsule Take 10 mg by mouth at bedtime.      Marland Kitchen zolpidem (AMBIEN) 10 MG tablet Take 10 mg by mouth at bedtime as needed. For sleep        Results for orders placed during the hospital encounter of 07/20/11 (from the past 48 hour(s))  CBC     Status: Abnormal   Collection Time   07/20/11  6:08 PM      Component Value Range Comment   WBC 13.3 (*) 4.0 - 10.5 (K/uL)    RBC 3.86 (*) 4.22 - 5.81 (MIL/uL)    Hemoglobin 12.1 (*) 13.0 - 17.0 (g/dL)    HCT 16.1 (*) 09.6 - 52.0 (%)    MCV 94.3  78.0 - 100.0 (fL)    MCH 31.3  26.0 - 34.0 (pg)    MCHC 33.2  30.0 - 36.0 (g/dL)  RDW 14.5  11.5 - 15.5 (%)    Platelets 149 (*) 150 - 400 (K/uL)    COMPREHENSIVE METABOLIC PANEL     Status: Abnormal   Collection Time   07/20/11  6:08 PM      Component Value Range Comment   Sodium 138  135 - 145 (mEq/L)    Potassium 4.0  3.5 - 5.1 (mEq/L)    Chloride 102  96 - 112 (mEq/L)    CO2 21  19 - 32 (mEq/L)    Glucose, Bld 228 (*) 70 - 99 (mg/dL)    BUN 55 (*) 6 - 23 (mg/dL)    Creatinine, Ser 4.09 (*) 0.50 - 1.35 (mg/dL)    Calcium 9.2  8.4 - 10.5 (mg/dL)    Total Protein 6.6  6.0 - 8.3 (g/dL)    Albumin 3.9  3.5 - 5.2 (g/dL)    AST 24  0 - 37 (U/L)    ALT 21  0 - 53 (U/L)    Alkaline Phosphatase 75  39 - 117 (U/L)    Total Bilirubin 0.2 (*) 0.3 - 1.2 (mg/dL)    GFR calc non Af Amer 38 (*) >90 (mL/min)    GFR calc Af Amer 45 (*) >90 (mL/min)   PROTIME-INR     Status: Normal   Collection Time   07/20/11  6:08 PM      Component Value Range Comment   Prothrombin Time 13.4  11.6 - 15.2 (seconds)    INR 1.00  0.00 - 1.49    APTT     Status: Normal   Collection Time   07/20/11  6:08 PM      Component Value Range Comment   aPTT 25  24 - 37 (seconds)   POCT I-STAT, CHEM 8     Status: Abnormal   Collection Time   07/20/11  6:26 PM      Component Value Range Comment   Sodium 140  135 - 145 (mEq/L)    Potassium 4.0  3.5 - 5.1 (mEq/L)    Chloride 110  96 - 112 (mEq/L)    BUN 55 (*) 6 - 23 (mg/dL)    Creatinine, Ser 8.11 (*) 0.50 - 1.35 (mg/dL)    Glucose, Bld 914 (*) 70 - 99 (mg/dL)    Calcium, Ion 7.82  1.12 - 1.32 (mmol/L)    TCO2 20  0 - 100 (mmol/L)    Hemoglobin 12.9 (*) 13.0 - 17.0 (g/dL)    HCT 95.6 (*) 21.3 - 52.0 (%)   POCT I-STAT TROPONIN I     Status: Normal   Collection Time   07/20/11  6:35 PM      Component Value Range Comment   Troponin i, poc 0.01  0.00 - 0.08 (ng/mL)    Comment 3             Dg Chest Portable 1 View  07/20/2011  *RADIOLOGY REPORT*  Clinical Data: Chest pain.  Cough.  Congestion.  PORTABLE CHEST - 1 VIEW  Comparison: None.  Findings: Artifact overlies chest.  Heart size is normal. Mediastinal shadows are  normal.  There is bibasilar atelectasis and/or infiltrate, left more than right.  No effusions.  No evidence of heart failure.  IMPRESSION: Likely bilateral lower lobe pneumonia left worse than right.  Original Report Authenticated By: Thomasenia Sales, M.D.    Review of Systems  Constitutional: Negative.   HENT: Negative.   Eyes: Negative.   Respiratory: Positive for cough, sputum production,  shortness of breath and wheezing.   Cardiovascular: Positive for chest pain.  Gastrointestinal: Negative.   Genitourinary: Negative.   Musculoskeletal: Negative.   Skin: Negative.   Neurological: Negative.   Endo/Heme/Allergies: Negative.   Psychiatric/Behavioral: Negative.     Blood pressure 133/51, pulse 93, temperature 98.3 F (36.8 C), temperature source Oral, resp. rate 19, SpO2 97.00%. Physical Exam  Constitutional: He is oriented to person, place, and time. He appears well-developed and well-nourished. No distress.  HENT:  Head: Normocephalic and atraumatic.  Right Ear: External ear normal.  Left Ear: External ear normal.  Nose: Nose normal.  Mouth/Throat: Oropharynx is clear and moist. No oropharyngeal exudate.  Eyes: Conjunctivae are normal. Pupils are equal, round, and reactive to light. Right eye exhibits no discharge. Left eye exhibits no discharge. No scleral icterus.  Neck: Normal range of motion. Neck supple.  Cardiovascular:       Sinus tachycardia.  Respiratory: Effort normal. He has no wheezes. He has no rales.       Distant lung sounds.  GI: Soft. Bowel sounds are normal. He exhibits no distension. There is no tenderness. There is no rebound.  Musculoskeletal: Normal range of motion. He exhibits no edema and no tenderness.  Neurological: He is alert and oriented to person, place, and time.       Moves upper and lower limbs 5/5.  Skin: Skin is warm and dry. No rash noted. He is not diaphoretic. No erythema.  Psychiatric: His behavior is normal.     Assessment/Plan #1.  Atrial fibrillation with RVR presently in sinus rhythm - patient has a CHADS 2 score of at least 2. At this time I am placing patient on full dose aspirin. Patient eventually may need to be on Coumadin or the newer anticoagulants like Pradaxa or Xaralta.We will also check d-dimer and TSH levels. #2. Chest pain  - may have been precipitated by atrial fibrillation. Presently is chest pain-free. Cycle cardiac markers. As suggested earlier check d-dimer. Patient did have a cardiac stress test done in [place], which as per the patient was negative. Patient also had a 2-D echo done in November 2012 which are showing EF of 59% and normal LV, no valvular abnormalities or pericardial effusion,atient has a copy of this. #3. Pneumonia with COPD - as patient has been on multiple antibiotics I am placing patient on vancomycin and Avelox. We'll continue with nebulizer along with Pulmicort. We'll use Xopenex instead of albuterol due to the A. fib. #4. Hyperglycemia probably from recent use of steroids   - we'll place patient on sliding scale coverage and check hemoglobin A1c. #5. Probable chronic kidney disease  - patient's creatinine last year was 1.3. We will gently hydrate. Recheck metabolic panel in a.m. Check urinalysis. Hold HCTZ for now. May have to hold her lisinopril if creatinine worsens.  CODE STATUS  - full code.    07/20/2011, 8:57 PM

## 2011-07-20 NOTE — Progress Notes (Signed)
ANTIBIOTIC CONSULT NOTE - INITIAL  Pharmacy Consult for Vancomycin Indication: rule out pneumonia  No Known Allergies  Patient Measurements: Height: 5' 6.5" (168.9 cm) Weight: 195 lb (88.451 kg) IBW/kg (Calculated) : 64.95   Vital Signs: Temp: 97.7 F (36.5 C) (02/04 2149) Temp src: Oral (02/04 2149) BP: 135/75 mmHg (02/04 2149) Pulse Rate: 88  (02/04 2149) Intake/Output from previous day:   Intake/Output from this shift:    Labs:  Basename 07/20/11 1826 07/20/11 1808  WBC -- 13.3*  HGB 12.9* 12.1*  PLT -- 149*  LABCREA -- --  CREATININE 1.60* 1.66*   Estimated Creatinine Clearance: 41.3 ml/min (by C-G formula based on Cr of 1.6). No results found for this basename: VANCOTROUGH:2,VANCOPEAK:2,VANCORANDOM:2,GENTTROUGH:2,GENTPEAK:2,GENTRANDOM:2,TOBRATROUGH:2,TOBRAPEAK:2,TOBRARND:2,AMIKACINPEAK:2,AMIKACINTROU:2,AMIKACIN:2, in the last 72 hours   Microbiology: No results found for this or any previous visit (from the past 720 hour(s)).  Medical History: Past Medical History  Diagnosis Date  . Bronchitis   . COPD (chronic obstructive pulmonary disease)   . Hypertension     Medications:  Prescriptions prior to admission  Medication Sig Dispense Refill  . albuterol-ipratropium (COMBIVENT) 18-103 MCG/ACT inhaler Inhale 2 puffs into the lungs every 6 (six) hours as needed. For shortness of breath      . allopurinol (ZYLOPRIM) 100 MG tablet Take 100 mg by mouth 2 (two) times daily.      Marland Kitchen aspirin 81 MG tablet Take 81 mg by mouth every evening.       . benzonatate (TESSALON) 100 MG capsule Take 100 mg by mouth 3 (three) times daily as needed. For cough      . calcium-vitamin D (OSCAL WITH D) 500-200 MG-UNIT per tablet Take 1 tablet by mouth daily.      . cefdinir (OMNICEF) 300 MG capsule Take 300 mg by mouth 2 (two) times daily.      . cyclobenzaprine (FLEXERIL) 10 MG tablet Take 10 mg by mouth 3 (three) times daily as needed. For muscle spasms      . diphenhydrAMINE  (SOMINEX) 25 MG tablet Take 50 mg by mouth at bedtime as needed. For sleep      . doxazosin (CARDURA) 8 MG tablet Take 8 mg by mouth at bedtime.      Marland Kitchen glucosamine-chondroitin 500-400 MG tablet Take 1 tablet by mouth 3 (three) times daily.      Marland Kitchen HYDROcodone-homatropine (HYCODAN) 5-1.5 MG/5ML syrup Take 5 mLs by mouth every 8 (eight) hours as needed for cough.  240 mL  1  . ibuprofen (ADVIL,MOTRIN) 400 MG tablet Take 400 mg by mouth every 6 (six) hours as needed. For pain      . ipratropium-albuterol (DUONEB) 0.5-2.5 (3) MG/3ML SOLN Take 3 mLs by nebulization every 4 (four) hours as needed. For shortness of breath      . lansoprazole (PREVACID) 30 MG capsule Take 30 mg by mouth every evening.      . latanoprost (XALATAN) 0.005 % ophthalmic solution Place 1 drop into both eyes at bedtime.       Marland Kitchen levofloxacin (LEVAQUIN) 500 MG tablet Take 500 mg by mouth daily.      Marland Kitchen levothyroxine (SYNTHROID, LEVOTHROID) 75 MCG tablet Take 75 mcg by mouth daily.      Marland Kitchen lisinopril-hydrochlorothiazide (PRINZIDE,ZESTORETIC) 20-25 MG per tablet Take 1 tablet by mouth daily.      . magnesium hydroxide (MILK OF MAGNESIA) 400 MG/5ML suspension Take 5 mLs by mouth daily as needed. For upset stomach      . mometasone (NASONEX) 50 MCG/ACT nasal  spray Place 2 sprays into the nose daily.      Marland Kitchen oxyCODONE-acetaminophen (PERCOCET) 7.5-325 MG per tablet Take 1 tablet by mouth every 4 (four) hours as needed. For pain      . predniSONE (DELTASONE) 20 MG tablet Take 20-60 mg by mouth daily. Start date unknown; 3 daily for 2 days, 2 daily for 2 days, one daily for 2 days      . pregabalin (LYRICA) 150 MG capsule Take 150 mg by mouth every evening.      . pregabalin (LYRICA) 75 MG capsule Take 75 mg by mouth 2 (two) times daily.      . sertraline (ZOLOFT) 100 MG tablet Take 100 mg by mouth daily.      . tadalafil (CIALIS) 20 MG tablet Take 20 mg by mouth daily as needed. For erectile dysfunction      . temazepam (RESTORIL) 15 MG  capsule Take 15 mg by mouth at bedtime as needed. For sleep      . zaleplon (SONATA) 10 MG capsule Take 10 mg by mouth at bedtime.      Marland Kitchen zolpidem (AMBIEN) 10 MG tablet Take 10 mg by mouth at bedtime as needed. For sleep       Assessment: 78 yo M admitted with CP and tachycardia.  Pharmacy consulted to start vancomycin for empiric treatment of pneumonia in setting of COPD.  Goal of Therapy:  Vancomycin trough level 15-20 mcg/ml  Plan:  1. Vancomycin 750 mg IV q12h 2. Follow up SCr, UOP, cultures, clinical course and adjust as clinically indicated.   Natalie Leclaire Christine Virginia Crews 07/20/2011,9:57 PM

## 2011-07-21 ENCOUNTER — Other Ambulatory Visit: Payer: Self-pay

## 2011-07-21 ENCOUNTER — Inpatient Hospital Stay (HOSPITAL_COMMUNITY): Payer: Medicare Other

## 2011-07-21 DIAGNOSIS — I4891 Unspecified atrial fibrillation: Secondary | ICD-10-CM

## 2011-07-21 DIAGNOSIS — R079 Chest pain, unspecified: Secondary | ICD-10-CM

## 2011-07-21 DIAGNOSIS — J189 Pneumonia, unspecified organism: Secondary | ICD-10-CM | POA: Diagnosis not present

## 2011-07-21 DIAGNOSIS — R918 Other nonspecific abnormal finding of lung field: Secondary | ICD-10-CM

## 2011-07-21 DIAGNOSIS — D696 Thrombocytopenia, unspecified: Secondary | ICD-10-CM | POA: Diagnosis not present

## 2011-07-21 DIAGNOSIS — R7989 Other specified abnormal findings of blood chemistry: Secondary | ICD-10-CM | POA: Diagnosis not present

## 2011-07-21 DIAGNOSIS — R05 Cough: Secondary | ICD-10-CM

## 2011-07-21 DIAGNOSIS — R0602 Shortness of breath: Secondary | ICD-10-CM

## 2011-07-21 DIAGNOSIS — R059 Cough, unspecified: Secondary | ICD-10-CM

## 2011-07-21 DIAGNOSIS — J449 Chronic obstructive pulmonary disease, unspecified: Secondary | ICD-10-CM

## 2011-07-21 LAB — COMPREHENSIVE METABOLIC PANEL
ALT: 18 U/L (ref 0–53)
AST: 20 U/L (ref 0–37)
Albumin: 3.3 g/dL — ABNORMAL LOW (ref 3.5–5.2)
Alkaline Phosphatase: 67 U/L (ref 39–117)
BUN: 45 mg/dL — ABNORMAL HIGH (ref 6–23)
CO2: 26 mEq/L (ref 19–32)
Calcium: 9.1 mg/dL (ref 8.4–10.5)
Chloride: 109 mEq/L (ref 96–112)
Creatinine, Ser: 1.35 mg/dL (ref 0.50–1.35)
GFR calc Af Amer: 57 mL/min — ABNORMAL LOW (ref >90)
GFR calc non Af Amer: 49 mL/min — ABNORMAL LOW (ref >90)
Glucose, Bld: 99 mg/dL (ref 70–99)
Potassium: 3.9 mEq/L (ref 3.5–5.1)
Sodium: 142 mEq/L (ref 135–145)
Total Bilirubin: 0.2 mg/dL — ABNORMAL LOW (ref 0.3–1.2)
Total Protein: 5.7 g/dL — ABNORMAL LOW (ref 6.0–8.3)

## 2011-07-21 LAB — CBC
HCT: 34.5 % — ABNORMAL LOW (ref 39.0–52.0)
Hemoglobin: 11.4 g/dL — ABNORMAL LOW (ref 13.0–17.0)
MCH: 31.5 pg (ref 26.0–34.0)
MCHC: 33 g/dL (ref 30.0–36.0)
MCV: 95.3 fL (ref 78.0–100.0)
Platelets: 158 10*3/uL (ref 150–400)
RBC: 3.62 MIL/uL — ABNORMAL LOW (ref 4.22–5.81)
RDW: 14.7 % (ref 11.5–15.5)
WBC: 11.3 10*3/uL — ABNORMAL HIGH (ref 4.0–10.5)

## 2011-07-21 LAB — CARDIAC PANEL(CRET KIN+CKTOT+MB+TROPI)
CK, MB: 7 ng/mL (ref 0.3–4.0)
CK, MB: 7.2 ng/mL (ref 0.3–4.0)
Relative Index: 2.4 (ref 0.0–2.5)
Relative Index: 2.5 (ref 0.0–2.5)
Total CK: 296 U/L — ABNORMAL HIGH (ref 7–232)
Total CK: 284 U/L — ABNORMAL HIGH (ref 7–232)
Troponin I: <0.3 ng/mL (ref <0.30)
Troponin I: <0.3 ng/mL (ref <0.30)

## 2011-07-21 LAB — HEMOGLOBIN A1C
Hgb A1c MFr Bld: 6 % — ABNORMAL HIGH (ref <5.7)
Mean Plasma Glucose: 126 mg/dL — ABNORMAL HIGH (ref <117)

## 2011-07-21 LAB — GLUCOSE, CAPILLARY
Glucose-Capillary: 90 mg/dL (ref 70–99)
Glucose-Capillary: 88 mg/dL (ref 70–99)
Glucose-Capillary: 130 mg/dL — ABNORMAL HIGH (ref 70–99)

## 2011-07-21 LAB — TSH: TSH: 0.42 u[IU]/mL (ref 0.350–4.500)

## 2011-07-21 MED ORDER — OFF THE BEAT BOOK
Freq: Once | Status: AC
  Administered 2011-07-21
  Filled 2011-07-21 (×2): qty 1

## 2011-07-21 MED ORDER — XENON XE 133 GAS
10.0000 | GAS_FOR_INHALATION | Freq: Once | RESPIRATORY_TRACT | Status: AC | PRN
  Administered 2011-07-21: 10 via RESPIRATORY_TRACT

## 2011-07-21 MED ORDER — BUDESONIDE-FORMOTEROL FUMARATE 160-4.5 MCG/ACT IN AERO: 2.0000 | INHALATION_SPRAY | Freq: Two times a day (BID) | RESPIRATORY_TRACT | Status: DC

## 2011-07-21 MED ORDER — DILTIAZEM HCL ER COATED BEADS 180 MG PO CP24: 180.0000 mg | ORAL_CAPSULE | Freq: Every day | ORAL | Status: DC

## 2011-07-21 MED ORDER — HYDROCOD POLST-CHLORPHEN POLST 10-8 MG/5ML PO LQCR
5.0000 mL | Freq: Two times a day (BID) | ORAL | Status: DC | PRN
  Administered 2011-07-21: 5 mL via ORAL
  Filled 2011-07-21: qty 5

## 2011-07-21 MED ORDER — BUDESONIDE 0.5 MG/2ML IN SUSP: 0.5000 mg | Freq: Two times a day (BID) | RESPIRATORY_TRACT | Status: DC

## 2011-07-21 MED ORDER — LEVALBUTEROL HCL 0.63 MG/3ML IN NEBU
0.6300 mg | INHALATION_SOLUTION | RESPIRATORY_TRACT | Status: DC | PRN
  Filled 2011-07-21: qty 3

## 2011-07-21 MED ORDER — TECHNETIUM TO 99M ALBUMIN AGGREGATED
6.0000 | Freq: Once | INTRAVENOUS | Status: AC | PRN
  Administered 2011-07-21: 6 via INTRAVENOUS

## 2011-07-21 MED ORDER — RIVAROXABAN 15 MG PO TABS: 15.0000 mg | ORAL_TABLET | Freq: Every day | ORAL | Status: DC

## 2011-07-21 MED ORDER — ENOXAPARIN SODIUM 40 MG/0.4ML ~~LOC~~ SOLN
40.0000 mg | SUBCUTANEOUS | Status: DC
  Filled 2011-07-21: qty 0.4

## 2011-07-21 MED ORDER — RIVAROXABAN 15 MG PO TABS
15.0000 mg | ORAL_TABLET | Freq: Every day | ORAL | Status: DC
  Administered 2011-07-21: 15 mg via ORAL
  Filled 2011-07-21: qty 1

## 2011-07-21 NOTE — Progress Notes (Signed)
Jonathan [name]  VOH:[id number]  DOB: 1932/09/05  DOA: 07/20/2011  PCP: Janace Hoard, M.D.  Subjective: Chest pain resolved,  Objective: Weight change:   Intake/Output Summary (Last 24 hours) at 07/21/11 1442 Last data filed at 07/21/11 1418  Gross per 24 hour  Intake    600 ml  Output      3 ml  Net    597 ml   Blood pressure 135/76, pulse 82, temperature 97.6 F (36.4 C), temperature source Oral, resp. rate 20, height 5' 6.5" (1.689 m), weight 90.1 kg (198 lb 10.2 oz), SpO2 95.00%.  Physical Exam: General: Alert and awake, oriented x3, not in any acute distress. HEENT: anicteric sclera, pupils reactive to light and accommodation, EOMI CVS: S1-S2 clear, no murmur rubs or gallops Chest: clear to auscultation bilaterally, no wheezing, rales or rhonchi Abdomen: soft nontender, nondistended, normal bowel sounds, no organomegaly Extremities: no cyanosis, clubbing or edema noted bilaterally Neuro: Cranial nerves II-XII intact, no focal neurological deficits  Lab Results: Basic Metabolic Panel:  Lab 07/21/11 6948 07/20/11 2156 07/20/11 1826 07/20/11 1808  NA 142 -- 140 --  K 3.9 -- 4.0 --  CL 109 -- 110 --  CO2 26 -- -- 21  GLUCOSE 99 -- 227* --  BUN 45* -- 55* --  CREATININE 1.35 1.56* -- --  CALCIUM 9.1 -- -- 9.2  MG -- -- -- --  PHOS -- -- -- --   Liver Function Tests:  Lab 07/21/11 0643 07/20/11 1808  AST 20 24  ALT 18 21  ALKPHOS 67 75  BILITOT 0.2* 0.2*  PROT 5.7* 6.6  ALBUMIN 3.3* 3.9   CBC:  Lab 07/21/11 0643 07/20/11 1826 07/20/11 1808  WBC 11.3* -- 13.3*  NEUTROABS -- -- --  HGB 11.4* 12.9* --  HCT 34.5* 38.0* --  MCV 95.3 -- 94.3  PLT 158 -- 149*   Cardiac Enzymes:  Lab 07/21/11 1200 07/21/11 0643 07/20/11 2148  CKTOTAL 284* 296* 333*  CKMB 7.2* 7.0* 8.7*  CKMBINDEX -- -- --  TROPONINI <0.30 <0.30 <0.30   BNP: No components found with this basename: POCBNP:2 CBG:  Lab 07/21/11 1159 07/21/11 0741 07/20/11 2223  GLUCAP 88 90 113*      Micro Results: No results found for this or any previous visit (from the past 240 hour(s)).  Studies/Results: Dg Chest Portable 1 View  07/20/2011  *RADIOLOGY REPORT*  Clinical Data: Chest pain.  Cough.  Congestion.  PORTABLE CHEST - 1 VIEW  Comparison: None.  Findings: Artifact overlies chest.  Heart size is normal. Mediastinal shadows are normal.  There is bibasilar atelectasis and/or infiltrate, left more than right.  No effusions.  No evidence of heart failure.  IMPRESSION: Likely bilateral lower lobe pneumonia left worse than right.  Original Report Authenticated By: Thomasenia Sales, M.D.    Medications: Scheduled Meds:   . allopurinol  100 mg Oral BID  . antiseptic oral rinse  15 mL Mouth Rinse BID  . aspirin  324 mg Oral Once  . aspirin EC  325 mg Oral Daily  . budesonide  0.5 mg Nebulization BID  . calcium-vitamin D  1 tablet Oral Daily  . doxazosin  8 mg Oral QHS  . enoxaparin  40 mg Subcutaneous Q24H  . fluticasone  2 spray Each Nare Daily  . insulin aspart  0-9 Units Subcutaneous TID WC  . latanoprost  1 drop Both Eyes QHS  . levalbuterol  0.63 mg Nebulization Q6H  . levothyroxine  75 mcg Oral  QAC breakfast  . moxifloxacin  400 mg Intravenous Once  . off the beat book   Does not apply Once  . pantoprazole  40 mg Oral Q1200  . pregabalin  150 mg Oral QPM  . pregabalin  75 mg Oral Custom  . sertraline  100 mg Oral QHS  . DISCONTD: budesonide  0.5 mg Nebulization BID  . DISCONTD: cefTRIAXone (ROCEPHIN)  IV  1 g Intravenous Q24H  . DISCONTD: enoxaparin  30 mg Subcutaneous Q24H  . DISCONTD: ipratropium  0.5 mg Nebulization Q6H  . DISCONTD: lisinopril  20 mg Oral Daily  . DISCONTD: moxifloxacin  400 mg Intravenous Q24H  . DISCONTD: moxifloxacin  400 mg Intravenous Q24H  . DISCONTD: pregabalin  75 mg Oral BID  . DISCONTD: sertraline  100 mg Oral Daily  . DISCONTD: vancomycin  750 mg Intravenous Q12H   Continuous Infusions:   . sodium chloride 75 mL/hr at 07/20/11  2151  . DISCONTD: sodium chloride    . DISCONTD: sodium chloride       Assessment/Plan: Principal Problem:  *Atrial fibrillation with RVR: HR 170 in ED, but spontaneously converted. Currently in normal sinus rhythm, appears to be paroxysmal A. Fib, CHADs score 2. Cardiac enzymes x3 for acute ACS  - Continue full dose aspirin, d-dimer is slightly elevated, will obtain VQ scan to rule out any pulmonary embolism - Patient has chronic kidney disease, so avoiding CT angiogram - Cardiology consult called, question if he needs to be on Coumadin or Pradaxa. Stress test done in [place] per patient was normal.  Active Problems:  COPD (chronic obstructive pulmonary disease) with bilateral pneumonia - Appreciate pulmonology recommendations, if beta blocker recommended by cardiology, recommended to use cardioselective BB - Continue Pulmicort, Flonase, Xopenex nebs and on IV Avelox   CKD (chronic kidney disease): - Creatinine better 1.35 from 1.56 at admission  Hypothyroidism: Obtain TSH continue Synthroid  DVT Prophylaxis: Lovenox  Code Status: Full code  Disposition: Hopefully DC home in a.m., awake VQ scan and cardiology recommendations. Discussed in detail with the patient.   LOS: 1 day   RAI,RIPUDEEP M.D. [facility] 07/21/2011, 2:42 PM

## 2011-07-21 NOTE — Consult Note (Signed)
Name: Jonathan [name] MRN: [id number] DOB: [birth year]    LOS: 1 Requesting MD:  Toniann Fail  PCCM CONSULT NOTE  History of Present Illness: 78 yo former smoker who is followed by Pulmonary in [place] for nocturnal hypoxia and is on nocturnal O2. He has has had a cough with yellow sputum since New Year's day. Treated with multiple rounds of antibiotics but continues tom have productive cough and C xR consistent with pneumonia. He was admitted 2/4 with cough and atrial fibrillation and PCCM asked to address COPD, PNA and hypoxia.    Cultures: none  Antibiotics: 2/4 avelox>> 2/5 2/4 vanco>> 2/5  Tests / Events:   Subjective: NAD at rest  Past Medical History  Diagnosis Date  . Bronchitis   . Hypertension   . Gout   . BPH (benign prostatic hyperplasia)   . Spinal compression fracture seventh vertebre  . COPD (chronic obstructive pulmonary disease)     2 liters O2 HS  . Hypoxia   . Fibromyalgia   . Peripheral neuropathy   . Osteoarthritis   . GERD (gastroesophageal reflux disease)   . Hypothyroidism   . Insomnia   . Rotator cuff tear, left   . Glaucoma     bilateral eyes  . Fatty tumor     waste and back  . Macular pucker   . Leg cramps   . Nasal congestion    Past Surgical History  Procedure Date  . Knee surgery   . Tonsil   . Appendectomy age 35  . Nasal concha bullosa resection age 79  . Vasectomy age 54  . Inguinal hernia repair age 57    rt side  . Cholecystectomy age 12  . Cataract extraction     bilateral   Prior to Admission medications   Medication Sig Start Date End Date Taking? Authorizing Provider  albuterol-ipratropium (COMBIVENT) 18-103 MCG/ACT inhaler Inhale 2 puffs into the lungs every 6 (six) hours as needed. For shortness of breath   Yes Historical Provider, MD  allopurinol (ZYLOPRIM) 100 MG tablet Take 100 mg by mouth 2 (two) times daily.   Yes Historical Provider, MD  aspirin 81 MG tablet Take 81 mg by mouth every evening.    Yes  Historical Provider, MD  benzonatate (TESSALON) 100 MG capsule Take 100 mg by mouth 3 (three) times daily as needed. For cough   Yes Historical Provider, MD  calcium-vitamin D (OSCAL WITH D) 500-200 MG-UNIT per tablet Take 1 tablet by mouth daily.   Yes Historical Provider, MD  cefdinir (OMNICEF) 300 MG capsule Take 300 mg by mouth 2 (two) times daily.   Yes Historical Provider, MD  cyclobenzaprine (FLEXERIL) 10 MG tablet Take 10 mg by mouth 3 (three) times daily as needed. For muscle spasms   Yes Historical Provider, MD  diphenhydrAMINE (SOMINEX) 25 MG tablet Take 50 mg by mouth at bedtime as needed. For sleep   Yes Historical Provider, MD  doxazosin (CARDURA) 8 MG tablet Take 8 mg by mouth at bedtime.   Yes Historical Provider, MD  glucosamine-chondroitin 500-400 MG tablet Take 1 tablet by mouth 3 (three) times daily.   Yes Historical Provider, MD  HYDROcodone-homatropine (HYCODAN) 5-1.5 MG/5ML syrup Take 5 mLs by mouth every 8 (eight) hours as needed for cough. 07/18/11 07/28/11 Yes Janace Hoard, MD  ibuprofen (ADVIL,MOTRIN) 400 MG tablet Take 400 mg by mouth every 6 (six) hours as needed. For pain   Yes Historical Provider, MD  ipratropium-albuterol (DUONEB) 0.5-2.5 (3) MG/3ML SOLN Take  3 mLs by nebulization every 4 (four) hours as needed. For shortness of breath 07/18/11  Yes Janace Hoard, MD  lansoprazole (PREVACID) 30 MG capsule Take 30 mg by mouth every evening.   Yes Historical Provider, MD  latanoprost (XALATAN) 0.005 % ophthalmic solution Place 1 drop into both eyes at bedtime.    Yes Historical Provider, MD  levofloxacin (LEVAQUIN) 500 MG tablet Take 500 mg by mouth daily.   Yes Historical Provider, MD  levothyroxine (SYNTHROID, LEVOTHROID) 75 MCG tablet Take 75 mcg by mouth daily.   Yes Historical Provider, MD  lisinopril-hydrochlorothiazide (PRINZIDE,ZESTORETIC) 20-25 MG per tablet Take 1 tablet by mouth daily.   Yes Historical Provider, MD  magnesium hydroxide (MILK OF MAGNESIA) 400 MG/5ML  suspension Take 5 mLs by mouth daily as needed. For upset stomach   Yes Historical Provider, MD  mometasone (NASONEX) 50 MCG/ACT nasal spray Place 2 sprays into the nose daily.   Yes Historical Provider, MD  oxyCODONE-acetaminophen (PERCOCET) 7.5-325 MG per tablet Take 1 tablet by mouth every 4 (four) hours as needed. For pain   Yes Historical Provider, MD  predniSONE (DELTASONE) 20 MG tablet Take 20-60 mg by mouth daily. Start date unknown; 3 daily for 2 days, 2 daily for 2 days, one daily for 2 days 07/18/11  Yes Janace Hoard, MD  pregabalin (LYRICA) 150 MG capsule Take 150 mg by mouth every evening.   Yes Historical Provider, MD  pregabalin (LYRICA) 75 MG capsule Take 75 mg by mouth 2 (two) times daily.   Yes Historical Provider, MD  sertraline (ZOLOFT) 100 MG tablet Take 100 mg by mouth daily.   Yes Historical Provider, MD  tadalafil (CIALIS) 20 MG tablet Take 20 mg by mouth daily as needed. For erectile dysfunction   Yes Historical Provider, MD  temazepam (RESTORIL) 15 MG capsule Take 15 mg by mouth at bedtime as needed. For sleep   Yes Historical Provider, MD  zaleplon (SONATA) 10 MG capsule Take 10 mg by mouth at bedtime.   Yes Historical Provider, MD  zolpidem (AMBIEN) 10 MG tablet Take 10 mg by mouth at bedtime as needed. For sleep   Yes Historical Provider, MD   Allergies No Known Allergies  Family History Family History  Problem Relation Age of Onset  . Coronary artery disease Brother     Social History  reports that he quit smoking about 36 years ago. He does not have any smokeless tobacco history on file. He reports that he drinks alcohol. He reports that he does not use illicit drugs.  Review Of Systems  11 points review of systems is negative with an exception of listed in HPI.  Vital Signs: Temp:  [97.5 F (36.4 C)-98.3 F (36.8 C)] 97.5 F (36.4 C) (02/05 0500) Pulse Rate:  [67-170] 67  (02/05 0500) Resp:  [12-20] 18  (02/05 0500) BP: (83-135)/(22-75) 111/70 mmHg  (02/05 0500) SpO2:  [94 %-100 %] 98 % (02/05 0500) Weight:  [198 lb 10.2 oz (90.1 kg)] 198 lb 10.2 oz (90.1 kg) (02/05 0500)    Physical Examination: General:  WNWDWM Neuro:  intact   HEENT:  No jvd Cardiovascular:  hsr rrr Lungs:  No wheeze Abdomen:  Soft + bs Musculoskeletal:  intact Skin:  Warm, no edema  Ventilator settings:    Labs and Imaging:  Dg Chest Portable 1 View  07/20/2011  *RADIOLOGY REPORT*  Clinical Data: Chest pain.  Cough.  Congestion.  PORTABLE CHEST - 1 VIEW  Comparison: None.  Findings: Artifact overlies  chest.  Heart size is normal. Mediastinal shadows are normal.  There is bibasilar atelectasis and/or infiltrate, left more than right.  No effusions.  No evidence of heart failure.  IMPRESSION: Likely bilateral lower lobe pneumonia left worse than right.  Original Report Authenticated By: Thomasenia Sales, M.D.    Lab 07/20/11 2156 07/20/11 1826 07/20/11 1808  NA -- 140 138  K -- 4.0 4.0  CL -- 110 102  CO2 -- -- 21  BUN -- 55* 55*  CREATININE 1.56* 1.60* 1.66*  GLUCOSE -- 227* 228*    Lab 07/21/11 0643 07/20/11 1826 07/20/11 1808  HGB 11.4* 12.9* 12.1*  HCT 34.5* 38.0* 36.4*  WBC 11.3* -- 13.3*  PLT 158 -- 149*   ABG    Component Value Date/Time   TCO2 20 07/20/2011 1826    Assessment and Plan: COPD/PNA/Nocturnal hypoxia/chronic cough (on ACEI) He has been on abx for the majority of the month of January. Therefore, it seems doubtful that his persistent respiratory symptoms of cough, DOE and his generalized symptom of fatigue are on the basis of an infectious bacterial process. I suspect that he has been in and out of AF as he notes that his pulse oximeter often reads a very high HR (In fact, he was going to return it thinking that it was not functioning properly). I do not think his chest Xray findings are worrisome for PNA. The RLL opacity is very minor and probably represents plate-like atelectasis. The retrocardiac density is minimal as could be  chronic or residua from a recent PNA for which he has been treated as an outpatient. He brought in PFTs from [place]. It appears that he has mild obstructive lung disease @ baseline with some reversibilty. We can probably find a more convenient maintenance regimen for COPD that will be less likely to contribute to tachyarrhythmias.   -D/C abx -Change maintenance COPD regimen to nebulized budesonide and formoterol BID with PRN albuterol or levalbuterol (Xopenex) for breakthrough symtoms. -Would strongly consider D/C of ACEI as a likely contributor to his chronic cough. If it is felt that the RAA system needs to be blocked, suggest an ARB as alternative.  -If a Beta blocker is recommended by Cards, would encourage that a cardioselective one be used (metoprolol or atenolol are good choices) -From a pulmonary perspective, he does not require hospitalization. Of course, the issues surrounding mgmt of PAF need to be sorted out -He wishes to establish F/U with [facility] as outpatient. I have provided him with a number he can call when he returns to town (he plans to travel back [place] for a couple of weeks after discharge) -I discussed my thoughts with Mr Dunsworth and with his daughter who is an Best boy in [place]   Billy Fischer, MD;  PCCM service; Mobile [phone]   ADD: it appears that we do not have nebulized formoterol on formulary here. Will leave on scheduled Levalbuterol while hospitalized. After discharge, the above changes should be made. A reasonable alternative would be to use a Symbicort inhaler (which are the same two meds) but he seems to prefer the nebs)  Billy Fischer, MD;  PCCM service; Mobile [phone]

## 2011-07-21 NOTE — Progress Notes (Signed)
Notified by cards that pt requesting to be discharged today.  Per cards, ok for discharge from their standpoint.  Attending MD paged. Waiting for return call.

## 2011-07-21 NOTE — Discharge Summary (Signed)
Physician Discharge Summary  Patient ID: Jonathan [name] MRN: [id number] DOB/AGE: [birth year] 76 y.o.  Admit date: 07/20/2011 Discharge date: 07/21/2011  Primary Care Physician:  No primary provider on file.  Discharge Diagnoses:    .Atrial fibrillation with RVR- paroxysmal, spontaneously converted .Pneumonia with chronic bronchitis .Chest pain .COPD (chronic obstructive pulmonary disease) .CKD (chronic kidney disease)  Consults:  Pulmonology, Dr Sung Amabile                    Cardiology, Dr Jens Som    Discharge Medications: Medication List  As of 07/21/2011  9:46 PM   STOP taking these medications         aspirin 81 MG tablet      cefdinir 300 MG capsule      levofloxacin 500 MG tablet      lisinopril-hydrochlorothiazide 20-25 MG per tablet      magnesium hydroxide 400 MG/5ML suspension         TAKE these medications         albuterol-ipratropium 18-103 MCG/ACT inhaler   Commonly known as: COMBIVENT   Inhale 2 puffs into the lungs every 6 (six) hours as needed. For shortness of breath      ipratropium-albuterol 0.5-2.5 (3) MG/3ML Soln   Commonly known as: DUONEB   Take 3 mLs by nebulization every 4 (four) hours as needed. For shortness of breath      allopurinol 100 MG tablet   Commonly known as: ZYLOPRIM   Take 100 mg by mouth 2 (two) times daily.      benzonatate 100 MG capsule   Commonly known as: TESSALON   Take 100 mg by mouth 3 (three) times daily as needed. For cough      budesonide-formoterol 160-4.5 MCG/ACT inhaler   Commonly known as: SYMBICORT   Inhale 2 puffs into the lungs 2 (two) times daily.      calcium-vitamin D 500-200 MG-UNIT per tablet   Commonly known as: OSCAL WITH D   Take 1 tablet by mouth daily.      cyclobenzaprine 10 MG tablet   Commonly known as: FLEXERIL   Take 10 mg by mouth 3 (three) times daily as needed. For muscle spasms      diltiazem 180 MG 24 hr capsule   Commonly known as: CARDIZEM CD   Take 1 capsule (180 mg total) by  mouth daily.      diphenhydrAMINE 25 MG tablet   Commonly known as: SOMINEX   Take 50 mg by mouth at bedtime as needed. For sleep      doxazosin 8 MG tablet   Commonly known as: CARDURA   Take 8 mg by mouth at bedtime.      glucosamine-chondroitin 500-400 MG tablet   Take 1 tablet by mouth 3 (three) times daily.      HYDROcodone-homatropine 5-1.5 MG/5ML syrup   Commonly known as: HYCODAN   Take 5 mLs by mouth every 8 (eight) hours as needed for cough.      ibuprofen 400 MG tablet   Commonly known as: ADVIL,MOTRIN   Take 400 mg by mouth every 6 (six) hours as needed. For pain      lansoprazole 30 MG capsule   Commonly known as: PREVACID   Take 30 mg by mouth every evening.      latanoprost 0.005 % ophthalmic solution   Commonly known as: XALATAN   Place 1 drop into both eyes at bedtime.      levothyroxine 75 MCG tablet  Commonly known as: SYNTHROID, LEVOTHROID   Take 75 mcg by mouth daily.      mometasone 50 MCG/ACT nasal spray   Commonly known as: NASONEX   Place 2 sprays into the nose daily.      oxyCODONE-acetaminophen 7.5-325 MG per tablet   Commonly known as: PERCOCET   Take 1 tablet by mouth every 4 (four) hours as needed. For pain      predniSONE 20 MG tablet   Commonly known as: DELTASONE   Take 20-60 mg by mouth daily. Start date unknown; 3 daily for 2 days, 2 daily for 2 days, one daily for 2 days      pregabalin 75 MG capsule   Commonly known as: LYRICA   Take 75 mg by mouth 2 (two) times daily.      pregabalin 150 MG capsule   Commonly known as: LYRICA   Take 150 mg by mouth every evening.      Rivaroxaban 15 MG Tabs tablet   Commonly known as: XARELTO   Take 1 tablet (15 mg total) by mouth daily with supper.      sertraline 100 MG tablet   Commonly known as: ZOLOFT   Take 100 mg by mouth daily.      tadalafil 20 MG tablet   Commonly known as: CIALIS   Take 20 mg by mouth daily as needed. For erectile dysfunction      temazepam 15 MG  capsule   Commonly known as: RESTORIL   Take 15 mg by mouth at bedtime as needed. For sleep      zaleplon 10 MG capsule   Commonly known as: SONATA   Take 10 mg by mouth at bedtime.      zolpidem 10 MG tablet   Commonly known as: AMBIEN   Take 10 mg by mouth at bedtime as needed. For sleep             Brief H and P: For complete details please refer to admission H and P, but in brief 78 year old male with known history of COPD on home oxygen, HTN, gout presented to the ER because of chest pain and increased heart rate. At the time, he checked his pulse oxymetry, it showed HR of 167. In ED, patient was found to be in Afib with RVR but then spontaneously reverted back to NSR. Patient also had multiple courses of antibiotics over the last month.  Hospital Course:    *Atrial fibrillation with RVR: Patient was admitted to the telefloor. He was ruled out for acute ACS. Cardiology consultation was obtained.Patient had an ECHO in [place] recently in 04/2011 with normal LV function. VQ scan done for slightly elevated DDimer was also low probability for PE. CHADS score was 3, hence cardiology recommended starting xarelto and discontinuing ASA. He was started on cardizem for rate control and Lisinopril/HCTZ was discontinued. Patient will be scheduled for out-patient Lexi scan Myoview and follow up with Dr. Jens Som in 4 weeks.    COPD (chronic obstructive pulmonary disease)/ PNA/chronic cough Patient was seen by Pulmonology and felt it doubtful that his overall symptoms are related to bacterial process and discontinued the antibiotics. Per pulmonology recommendations, ACEI was discontinued. Patient was started on symbicort and also recommended to follow up with Pulmonology in 4 weeks.     CKD (chronic kidney disease): improved and stable   Day of Discharge BP 135/76  Pulse 82  Temp(Src) 97.6 F (36.4 C) (Oral)  Resp 20  Ht  5' 6.5" (1.689 m)  Wt 90.1 kg (198 lb 10.2 oz)  BMI 31.58  kg/m2  SpO2 95%  Physical Exam: General: Alert and awake oriented x3 not in any acute distress. HEENT: anicteric sclera, pupils reactive to light and accommodation CVS: S1-S2 clear no murmur rubs or gallops Chest: clear to auscultation bilaterally, no wheezing rales or rhonchi Abdomen: soft nontender, nondistended, normal bowel sounds, no organomegaly Extremities: no cyanosis, clubbing or edema noted bilaterally Neuro: Cranial nerves II-XII intact, no focal neurological deficits   The results of significant diagnostics from this hospitalization (including imaging, microbiology, ancillary and laboratory) are listed below for reference.    LAB RESULTS: Basic Metabolic Panel:  Lab 07/21/11 0865 07/20/11 2156 07/20/11 1826 07/20/11 1808  NA 142 -- 140 --  K 3.9 -- 4.0 --  CL 109 -- 110 --  CO2 26 -- -- 21  GLUCOSE 99 -- 227* --  BUN 45* -- 55* --  CREATININE 1.35 1.56* -- --  CALCIUM 9.1 -- -- 9.2  MG -- -- -- --  PHOS -- -- -- --   Liver Function Tests:  Lab 07/21/11 0643 07/20/11 1808  AST 20 24  ALT 18 21  ALKPHOS 67 75  BILITOT 0.2* 0.2*  PROT 5.7* 6.6  ALBUMIN 3.3* 3.9   CBC:  Lab 07/21/11 0643 07/20/11 1826 07/20/11 1808  WBC 11.3* -- 13.3*  NEUTROABS -- -- --  HGB 11.4* 12.9* --  HCT 34.5* 38.0* --  MCV 95.3 -- --  PLT 158 -- 149*   Cardiac Enzymes:  Lab 07/21/11 1200 07/21/11 0643  CKTOTAL 284* 296*  CKMB 7.2* 7.0*  CKMBINDEX -- --  TROPONINI <0.30 <0.30   CBG:  Lab 07/21/11 1620 07/21/11 1159  GLUCAP 130* 88    Significant Diagnostic Studies:  Nm Pulmonary Per & Vent  07/21/2011  *RADIOLOGY REPORT*  Clinical Data:  Chest pain and shortness of breath.  NUCLEAR MEDICINE VENTILATION - PERFUSION LUNG SCAN  Technique:  Wash-in, equilibrium, and wash-out phase ventilation images were obtained using Xe-133 gas.  Perfusion images were obtained in multiple projections after intravenous injection of Tc- 71m MAA.  Radiopharmaceuticals:  10.0 mCi Xe-133 gas  and 6.0 mCi Tc-74m MAA.  Comparison:  Chest x-ray 07/19/2010.  Findings: The ventilation scan demonstrates no obvious ventilation defects.  There is delayed washout suggesting air trapping.  The perfusion lung scan demonstrates no segmental or subsegmental perfusion defects to suggest pulmonary embolism.  IMPRESSION: Negative VQ scan for pulmonary embolism.  Original Report Authenticated By: P. Loralie Champagne, M.D.   Dg Chest Portable 1 View  07/20/2011  *RADIOLOGY REPORT*  Clinical Data: Chest pain.  Cough.  Congestion.  PORTABLE CHEST - 1 VIEW  Comparison: None.  Findings: Artifact overlies chest.  Heart size is normal. Mediastinal shadows are normal.  There is bibasilar atelectasis and/or infiltrate, left more than right.  No effusions.  No evidence of heart failure.  IMPRESSION: Likely bilateral lower lobe pneumonia left worse than right.  Original Report Authenticated By: Thomasenia Sales, M.D.     Disposition and Follow-up: Discharge Orders    Future Orders Please Complete By Expires   Diet - low sodium heart healthy      Increase activity slowly          DISPOSITION: HOME DIET: HEART HEALTHY ACTIVITY: AS TOLERATED   DISCHARGE FOLLOW-UP Follow-up Information    Follow up with [facility] on 07/23/2011. (Lexiscan myoview. Our office will call you with appointment information regarding your stress  test.)    Contact information:   [facility] [address] [place] [address] [phone]       Follow up with Olga Millers, MD. (Our office will call you with appointment information)    Contact information:   [facility] [address] [place] [address] [phone]      Follow up with Billy Fischer, MD. Schedule an appointment as soon as possible for a visit in 4 weeks.   Contact information:   [address] [facility] [address] [place] [address] [place]  [address] [phone]          Time spent on Discharge: 45 MINUTES  Signed:  Estefanny Moler M.D. [facility] 07/21/2011, 9:46 PM

## 2011-07-21 NOTE — Consult Note (Signed)
CARDIOLOGY CONSULT NOTE  Patient ID: Jonathan [name], MRN: [id number], DOB/AGE: 01-26-1933 78 y.o. Admit date: 07/20/2011 Date of Consult: 07/21/2011  Primary Physician: Dr.William Alwyn Ren in [place]. Patient also frequently travels to [place] Primary Cardiologist: [place], consulted by Dr. Jens Som  Chief Complaint: Palpitations and chest pain Reason for Consultation: Atrial fibrillation w/ RVR  HPI: 78 y.o. male w/ PMHx significant for hypothyroidism, HTN, and COPD and no known cardiac history who presented to [facility] on 07/20/2011 with complaints of palpitations and chest pain.  He lives in [place]. [place] and [place] and therefore receives medical care in both states. He reports having an exercise stress test done in [place] that he reports was negative.  He has a copy of an echo report done in Nov '12 revealing EF 59%, normal LV, no valvular abnormalities, or pericardial effusion.  He reports having a recent COPD exacerbation and pneumonia in January. He has completed 2 courses of antibiotics and received IM solu-medrol as well as nebulizers from his PCP over the last couple of weeks with some improvement in CXR but minimal improvement in his respiratory symptoms including cough and DOE. He denies history of atrial fibrillation in the past, but reports his HR readings on his pulse oximeter have been elevated at home. He reported having an episode of chest tightness while at home on the day of presentation that was "band like" and resolved after 30-45 minutes. He denies radiation or associated symptoms. His heart rate was 140-167 on his pulse oximeter and therefore presented to the ED.  In the ED his initial cardiac enzymes were negative and EKG revealed A. Fib w/ RVR at 161bpm, nonspecific ST/T wave changes. He converted spontaneously to NSR and was admitted to medicine for further evaluation and treatment. Cardiac enzymes have been cycled and remained negative. He has remained chest  pain free and in NSR without ST/T wave changes on repeat EKG. DDimer was normal and VQ scan was low probability for PE.   Past Medical History  Diagnosis Date  . Bronchitis   . Hypertension   . Gout   . BPH (benign prostatic hyperplasia)   . Spinal compression fracture seventh vertebre  . COPD (chronic obstructive pulmonary disease)     2 liters O2 HS  . Hypoxia   . Fibromyalgia   . Peripheral neuropathy   . Osteoarthritis   . GERD (gastroesophageal reflux disease)   . Hypothyroidism   . Insomnia   . Rotator cuff tear, left   . Glaucoma     bilateral eyes  . Fatty tumor     waste and back  . Macular pucker   . Leg cramps   . Nasal congestion       Surgical History:  Past Surgical History  Procedure Date  . Knee surgery   . Tonsil   . Appendectomy age 89  . Nasal concha bullosa resection age 74  . Vasectomy age 63  . Inguinal hernia repair age 78    rt side  . Cholecystectomy age 30  . Cataract extraction     bilateral     Home Meds: Medication Sig  albuterol-ipratropium (COMBIVENT) 18-103 MCG/ACT inhaler Inhale 2 puffs into the lungs every 6 (six) hours as needed. For shortness of breath  allopurinol (ZYLOPRIM) 100 MG tablet Take 100 mg by mouth 2 (two) times daily.  aspirin 81 MG tablet Take 81 mg by mouth every evening.   benzonatate (TESSALON) 100 MG capsule Take 100 mg by  mouth 3 (three) times daily as needed. For cough  calcium-vitamin D (OSCAL WITH D) 500-200 MG-UNIT per tablet Take 1 tablet by mouth daily.  cefdinir (OMNICEF) 300 MG capsule Take 300 mg by mouth 2 (two) times daily.  cyclobenzaprine (FLEXERIL) 10 MG tablet Take 10 mg by mouth 3 (three) times daily as needed. For muscle spasms  diphenhydrAMINE (SOMINEX) 25 MG tablet Take 50 mg by mouth at bedtime as needed. For sleep  doxazosin (CARDURA) 8 MG tablet Take 8 mg by mouth at bedtime.  glucosamine-chondroitin 500-400 MG tablet Take 1 tablet by mouth 3 (three) times daily.    HYDROcodone-homatropine (HYCODAN) 5-1.5 MG/5ML syrup Take 5 mLs by mouth every 8 (eight) hours as needed for cough.  ibuprofen (ADVIL,MOTRIN) 400 MG tablet Take 400 mg by mouth every 6 (six) hours as needed. For pain  ipratropium-albuterol (DUONEB) 0.5-2.5 (3) MG/3ML SOLN Take 3 mLs by nebulization every 4 (four) hours as needed. For shortness of breath  lansoprazole (PREVACID) 30 MG capsule Take 30 mg by mouth every evening.  latanoprost (XALATAN) 0.005 % ophthalmic solution Place 1 drop into both eyes at bedtime.   levofloxacin (LEVAQUIN) 500 MG tablet Take 500 mg by mouth daily.  levothyroxine (SYNTHROID, LEVOTHROID) 75 MCG tablet Take 75 mcg by mouth daily.  lisinopril-hydrochlorothiazide (PRINZIDE,ZESTORETIC) 20-25 MG per tablet Take 1 tablet by mouth daily.  magnesium hydroxide (MILK OF MAGNESIA) 400 MG/5ML suspension Take 5 mLs by mouth daily as needed. For upset stomach  mometasone (NASONEX) 50 MCG/ACT nasal spray Place 2 sprays into the nose daily.  oxyCODONE-acetaminophen (PERCOCET) 7.5-325 MG per tablet Take 1 tablet by mouth every 4 (four) hours as needed. For pain  predniSONE (DELTASONE) 20 MG tablet Take 20-60 mg by mouth daily. Start date unknown; 3 daily for 2 days, 2 daily for 2 days, one daily for 2 days  pregabalin (LYRICA) 150 MG capsule Take 150 mg by mouth every evening.  pregabalin (LYRICA) 75 MG capsule Take 75 mg by mouth 2 (two) times daily.  sertraline (ZOLOFT) 100 MG tablet Take 100 mg by mouth daily.  tadalafil (CIALIS) 20 MG tablet Take 20 mg by mouth daily as needed. For erectile dysfunction  temazepam (RESTORIL) 15 MG capsule Take 15 mg by mouth at bedtime as needed. For sleep  zaleplon (SONATA) 10 MG capsule Take 10 mg by mouth at bedtime.  zolpidem (AMBIEN) 10 MG tablet Take 10 mg by mouth at bedtime as needed. For sleep    Inpatient Medications:   . allopurinol  100 mg Oral BID  . antiseptic oral rinse  15 mL Mouth Rinse BID  . aspirin  324 mg Oral Once   . aspirin EC  325 mg Oral Daily  . budesonide  0.5 mg Nebulization BID  . calcium-vitamin D  1 tablet Oral Daily  . doxazosin  8 mg Oral QHS  . enoxaparin  40 mg Subcutaneous Q24H  . fluticasone  2 spray Each Nare Daily  . insulin aspart  0-9 Units Subcutaneous TID WC  . latanoprost  1 drop Both Eyes QHS  . levalbuterol  0.63 mg Nebulization Q6H  . levothyroxine  75 mcg Oral QAC breakfast  . moxifloxacin  400 mg Intravenous Once  . pantoprazole  40 mg Oral Q1200  . pregabalin  150 mg Oral QPM  . pregabalin  75 mg Oral Custom  . sertraline  100 mg Oral QHS  . DISCONTD: budesonide  0.5 mg Nebulization BID  . DISCONTD: cefTRIAXone (ROCEPHIN)  IV  1  g Intravenous Q24H  . DISCONTD: enoxaparin  30 mg Subcutaneous Q24H  . DISCONTD: ipratropium  0.5 mg Nebulization Q6H  . DISCONTD: lisinopril  20 mg Oral Daily  . DISCONTD: moxifloxacin  400 mg Intravenous Q24H  . DISCONTD: moxifloxacin  400 mg Intravenous Q24H  . DISCONTD: pregabalin  75 mg Oral BID  . DISCONTD: sertraline  100 mg Oral Daily  . DISCONTD: vancomycin  750 mg Intravenous Q12H    . sodium chloride 20 mL/hr at 07/21/11 1130    Allergies: No Known Allergies  History   Social History  . Marital Status: Single   Occupational History  . Retired alcohol and drug abuse counselor   Social History Main Topics  . Smoking status: Former Smoker    Quit date: 07/18/1975  . Alcohol Use: Yes     1-2 drinks/day  . Drug Use: No   Family History  Problem Relation Age of Onset  . Coronary artery disease Brother     Review of Systems: General: negative for chills, fever, night sweats or weight changes.  Cardiovascular: (+) chest pain, palpitations, dyspnea on exertion; negative for shortness of breath, edema, orthopnea, or paroxysmal nocturnal dyspnea Dermatological: negative for rash Respiratory: (+) productive cough & wheezing Urologic: negative for hematuria Abdominal: negative for nausea, vomiting, diarrhea, bright red  blood per rectum, melena, or hematemesis Neurologic: negative for visual changes, syncope, or dizziness All other systems reviewed and are otherwise negative except as noted above.  Labs:  [facility] 07/21/11 1200 07/21/11 0643 07/20/11 2148  CKTOTAL 284* 296* 333*  CKMB 7.2* 7.0* 8.7*  TROPONINI <0.30 <0.30 <0.30   Component Value Date   WBC 11.3* 07/21/2011   HGB 11.4* 07/21/2011   HCT 34.5* 07/21/2011   MCV 95.3 07/21/2011   PLT 158 07/21/2011    Lab 07/21/11 0643  NA 142  K 3.9  CL 109  CO2 26  BUN 45*  CREATININE 1.35  CALCIUM 9.1  PROT 5.7*  BILITOT 0.2*  ALKPHOS 67  ALT 18  AST 20  GLUCOSE 99   Component Value Date   DDIMER 0.32 07/20/2011     07/20/2011 21:56  TSH 0.420     07/20/2011 21:56  Hemoglobin A1C 6.0 (H)    Radiology/Studies: .  07/21/11 - VQ Scan Findings: The ventilation scan demonstrates no obvious ventilation defects. There is delayed washout suggesting air trapping.  The perfusion lung scan demonstrates no segmental or subsegmental perfusion defects to suggest pulmonary embolism. IMPRESSION: Negative VQ scan for pulmonary embolism.  Dg Chest Portable 1 View 07/20/2011  Findings: Artifact overlies chest.  Heart size is normal. Mediastinal shadows are normal.  There is bibasilar atelectasis and/or infiltrate, left more than right.  No effusions.  No evidence of heart failure.  IMPRESSION: Likely bilateral lower lobe pneumonia left worse than right.      EKG: 07/20/11 @ 1725 - Atrial fibrillation w/ RVR 161bpm, nonspecific ST/T wave changes  Physical Exam: Blood pressure 135/76, pulse 82, temperature 97.6 F (36.4 C), temperature source Oral, resp. rate 20, height 5' 6.5" (1.689 m), weight 198 lb 10.2 oz (90.1 kg), SpO2 95.00%. General: Overweight elderly white male in no acute distress. Head: Normocephalic, atraumatic, sclera non-icteric, no xanthomas, nares are without discharge.  Neck: Supple. Negative for carotid bruits. JVD not elevated. Lungs: Distant  breath sounds, coarse in bilat bases. Otherwise Clear without wheezes, rales, or rhonchi. Breathing is unlabored. Heart: RRR with S1 S2. No murmurs, rubs, or gallops appreciated. Abdomen: Soft, non-tender, non-distended with  normoactive bowel sounds. No hepatomegaly. No rebound/guarding. No obvious abdominal masses. Msk:  Strength and tone appear normal for age. Extremities: No clubbing or cyanosis. Trace to 1+ BLE edema to mid shins.  Distal pedal pulses are 2+ and equal bilaterally. Neuro: Alert and oriented X 3. Moves all extremities spontaneously. Psych:  Responds to questions appropriately with a normal affect.   Assessment and Plan:   78 y.o. male w/ PMHx significant for HTN and COPD and no known cardiac history who presented to [facility] on 07/20/2011 with complaints of palpitations and chest pain and was found to be in A.Fib w/ RVR.  See MD note below for full plan. In short, will initiate Xarelto 15mg  daily with close monitoring of renal function. Stop ASA. Stop Lisinopril/HCTZ and initiate Cardizem CD 180mg  daily for rate control should he have recurrent a.fib as well as for BP control. Schedule Lexiscan Myoview for this Thursday (07/23/11) as an outpt . If he is not dc'd will need this scheduled as an inpt. Follow up with Dr. Jens Som in 4-6wks. Prescriptions for Xarelto and Cardizem provided to pt.  Signed, HOPE, JESSICA PA-C 07/21/2011, 4:11 PM   As above, patient seen and examined. Briefly 78 year old male with past medical history of hypertension, COPD, hypothyroidism, benign prostatic hypertrophy who we are asked to evaluate for chest pain and atrial fibrillation. Patient had an echocardiogram in [place] in November of 2012 that showed normal LV function. He also states he had an exercise treadmill that was discontinued early because of hypoxia. Apparently no ST changes. He has been diagnosed with COPD and is now on nocturnal O2. Since New Year's the patient has had problems  with productive cough and has been treated for bronchitis/pneumonia. Patient was admitted yesterday after having an episode of chest pain at home. He felt chest tightness "like a band across my chest". No radiation. No associated symptoms. Pain nonpruritic or positional. Resolved after 30-45 minutes. He checked his heart rate and it was between 140-167. He therefore presented to the emergency room. Initial electrocardiogram showed atrial fibrillation with a rapid ventricular response at 161. Prior septal infarct could not be excluded. Nonspecific ST changes. Patient converted spontaneously to sinus rhythm. Cardiology is now asked to further evaluate. Note cardiac markers are negative. VQ scan is low probability. BUN and creatinine today are 45 and 1.35. TSH 0.420. Etiology of patient's chest pain unclear. He may have had some discomfort from atrial fibrillation with a rapid ventricular response. He has ruled out with serial troponins. Will arrange outpatient Lexiscan Myoview for risk stratification. Patient also with atrial fibrillation with a rapid ventricular response now in normal sinus rhythm. Previous echocardiogram showed normal LV function. TSH is normal. I would recommend discontinuing his lisinopril HCT particularly in light of his renal insufficiency. Instead I would treat his hypertension with Cardizem CD 180 mg daily. This should help with rate control if his atrial fibrillation recurs and his hypertension. Note he is asymptomatic with his atrial fibrillation and is certainly at risk for recurrences given his pulmonary disease. He has embolic risk factors of age greater than 82 and hypertension. His CHADS-vasc score is 3. I would therefore recommend discontinuing his aspirin and instead treat with xeralto 15 mg daily (GFR 41-49). We will need to follow his hemoglobin and renal function closely in the future. Patient can be discharged from a cardiac standpoint and followup with me in 4-6 weeks. Olga Millers

## 2011-07-23 ENCOUNTER — Other Ambulatory Visit (HOSPITAL_COMMUNITY): Payer: Self-pay | Admitting: Cardiology

## 2011-07-23 ENCOUNTER — Ambulatory Visit (HOSPITAL_COMMUNITY): Payer: Medicare Other | Attending: Cardiovascular Disease | Admitting: Radiology

## 2011-07-23 VITALS — BP 147/73 | Ht 67.0 in | Wt 202.0 lb

## 2011-07-23 DIAGNOSIS — R002 Palpitations: Secondary | ICD-10-CM | POA: Insufficient documentation

## 2011-07-23 DIAGNOSIS — R0989 Other specified symptoms and signs involving the circulatory and respiratory systems: Secondary | ICD-10-CM | POA: Insufficient documentation

## 2011-07-23 DIAGNOSIS — I4891 Unspecified atrial fibrillation: Secondary | ICD-10-CM | POA: Insufficient documentation

## 2011-07-23 DIAGNOSIS — I10 Essential (primary) hypertension: Secondary | ICD-10-CM | POA: Diagnosis not present

## 2011-07-23 DIAGNOSIS — J449 Chronic obstructive pulmonary disease, unspecified: Secondary | ICD-10-CM | POA: Insufficient documentation

## 2011-07-23 DIAGNOSIS — Z8249 Family history of ischemic heart disease and other diseases of the circulatory system: Secondary | ICD-10-CM | POA: Diagnosis not present

## 2011-07-23 DIAGNOSIS — R0789 Other chest pain: Secondary | ICD-10-CM | POA: Insufficient documentation

## 2011-07-23 DIAGNOSIS — R0609 Other forms of dyspnea: Secondary | ICD-10-CM | POA: Diagnosis not present

## 2011-07-23 DIAGNOSIS — R079 Chest pain, unspecified: Secondary | ICD-10-CM | POA: Diagnosis not present

## 2011-07-23 DIAGNOSIS — R0602 Shortness of breath: Secondary | ICD-10-CM | POA: Diagnosis not present

## 2011-07-23 DIAGNOSIS — J4489 Other specified chronic obstructive pulmonary disease: Secondary | ICD-10-CM | POA: Insufficient documentation

## 2011-07-23 MED ORDER — TECHNETIUM TC 99M TETROFOSMIN IV KIT
10.0000 | PACK | Freq: Once | INTRAVENOUS | Status: AC | PRN
  Administered 2011-07-23: 10 via INTRAVENOUS

## 2011-07-23 MED ORDER — TECHNETIUM TC 99M TETROFOSMIN IV KIT
30.0000 | PACK | Freq: Once | INTRAVENOUS | Status: AC | PRN
  Administered 2011-07-23: 30 via INTRAVENOUS

## 2011-07-23 MED ORDER — REGADENOSON 0.4 MG/5ML IV SOLN
0.4000 mg | Freq: Once | INTRAVENOUS | Status: AC
  Administered 2011-07-23: 0.4 mg via INTRAVENOUS

## 2011-07-23 NOTE — Progress Notes (Signed)
[facility] 3 NUCLEAR MED [address] [place] [address] [phone]  Cardiology Nuclear Med Study  Jonathan [name] is a 78 y.o. male [id number] 03/14/1933   Nuclear Med Background Indication for Stress Test:  Evaluation for Ischemia and Abnormal EKG,and Patient seen in hospital on CP, AFIB that converted spontaneously, Enzymes negative History:  COPD,11/12' Echo and ? GXT (in [place] NML per pt), AFIB Cardiac Risk Factors: Family History - CAD and Hypertension  Symptoms:  Chest Pain, Chest Tightness (last date of chest discomfort 07/20/11), DOE and Palpitations   Nuclear Pre-Procedure Caffeine/Decaff Intake:  None NPO After: 10:00pm   Lungs:  clear IV 0.9% NS with Angio Cath:  20g  IV Site: R Antecubital x 1, tolerated well IV Started by:  Irean Hong, RN  Chest Size (in):  46 Cup Size: n/a  Height: 5\' 7"  (1.702 m)  Weight:  202 lb (91.627 kg)  BMI:  Body mass index is 31.64 kg/(m^2). Tech Comments:  n/a    Nuclear Med Study 1 or 2 day study: 1 day  Stress Test Type:  Lexiscan  Reading MD: Jonathan Haws, MD  Order Authorizing Provider:  Olga Millers, MD  Resting Radionuclide: Technetium 75m Tetrofosmin  Resting Radionuclide Dose: 11.0 mCi   Stress Radionuclide:  Technetium 75m Tetrofosmin  Stress Radionuclide Dose: 33.0 mCi           Stress Protocol Rest HR: 57 Stress HR: 78  Rest BP: 141/73 Stress BP: 134/66  Exercise Time (min): n/a METS: n/a   Predicted Max HR: 144 bpm % Max HR: 54.17 bpm Rate Pressure Product: 46962   Dose of Adenosine (mg):  n/a Dose of Lexiscan: 0.4 mg  Dose of Atropine (mg): n/a Dose of Dobutamine: n/a mcg/kg/min (at max HR)  Stress Test Technologist: Frederick Peers, EMT-P  Nuclear Technologist:  Domenic Polite, CNMT     Rest Procedure:  Myocardial perfusion imaging was performed at rest 45 minutes following the intravenous administration of Technetium 53m Tetrofosmin. Rest ECG: SB with occ pVC  Stress  Procedure:  The patient received IV Lexiscan 0.4 mg over 15-seconds.  Technetium 24m Tetrofosmin injected at 30-seconds.  There were no significant changes with Lexiscan.  Quantitative spect images were obtained after a 45 minute delay. Stress ECG: No significant change from baseline ECG  QPS Raw Data Images:  Normal; no motion artifact; normal heart/lung ratio. Stress Images:  Normal homogeneous uptake in all areas of the myocardium. Rest Images:  Normal homogeneous uptake in all areas of the myocardium. Subtraction (SDS):  Normal Transient Ischemic Dilatation (Normal <1.22):  1.11 Lung/Heart Ratio (Normal <0.45):  0.31  Quantitative Gated Spect Images QGS EDV:  93 ml QGS ESV:  33 ml QGS cine images:  NL LV Function; NL Wall Motion QGS EF: 65%  Impression Exercise Capacity:  Lexiscan with no exercise. BP Response:  Normal blood pressure response. Clinical Symptoms:  There is dyspnea. ECG Impression:  No significant ST segment change suggestive of ischemia. Comparison with Prior Nuclear Study: No images to compare  Overall Impression:  Normal stress nuclear study.   Jonathan [name]

## 2011-08-14 ENCOUNTER — Encounter: Payer: Self-pay | Admitting: Family Medicine

## 2011-08-18 ENCOUNTER — Ambulatory Visit: Payer: Medicare Other | Admitting: Cardiology

## 2011-08-20 ENCOUNTER — Institutional Professional Consult (permissible substitution): Payer: Medicare Other | Admitting: Emergency Medicine

## 2011-08-21 ENCOUNTER — Telehealth: Payer: Self-pay | Admitting: *Deleted

## 2011-08-21 ENCOUNTER — Ambulatory Visit (INDEPENDENT_AMBULATORY_CARE_PROVIDER_SITE_OTHER): Payer: BC Managed Care – PPO | Admitting: Cardiology

## 2011-08-21 ENCOUNTER — Encounter: Payer: Self-pay | Admitting: Cardiology

## 2011-08-21 VITALS — BP 120/82 | HR 57 | Ht 66.0 in | Wt 202.0 lb

## 2011-08-21 DIAGNOSIS — I4891 Unspecified atrial fibrillation: Secondary | ICD-10-CM | POA: Diagnosis not present

## 2011-08-21 DIAGNOSIS — R079 Chest pain, unspecified: Secondary | ICD-10-CM | POA: Diagnosis not present

## 2011-08-21 DIAGNOSIS — N189 Chronic kidney disease, unspecified: Secondary | ICD-10-CM

## 2011-08-21 DIAGNOSIS — J449 Chronic obstructive pulmonary disease, unspecified: Secondary | ICD-10-CM

## 2011-08-21 LAB — CBC WITH DIFFERENTIAL/PLATELET
Basophils Absolute: 0 10*3/uL (ref 0.0–0.1)
Basophils Relative: 0.4 % (ref 0.0–3.0)
Eosinophils Absolute: 0.1 10*3/uL (ref 0.0–0.7)
Eosinophils Relative: 2.1 % (ref 0.0–5.0)
HCT: 41.3 % (ref 39.0–52.0)
Hemoglobin: 13.9 g/dL (ref 13.0–17.0)
Lymphocytes Relative: 22.8 % (ref 12.0–46.0)
Lymphs Abs: 1.3 10*3/uL (ref 0.7–4.0)
MCHC: 33.6 g/dL (ref 30.0–36.0)
MCV: 95 fl (ref 78.0–100.0)
Monocytes Absolute: 0.7 10*3/uL (ref 0.1–1.0)
Monocytes Relative: 12.2 % — ABNORMAL HIGH (ref 3.0–12.0)
Neutro Abs: 3.5 10*3/uL (ref 1.4–7.7)
Neutrophils Relative %: 62.5 % (ref 43.0–77.0)
Platelets: 171 10*3/uL (ref 150.0–400.0)
RBC: 4.35 Mil/uL (ref 4.22–5.81)
RDW: 14.5 % (ref 11.5–14.6)
WBC: 5.5 10*3/uL (ref 4.5–10.5)

## 2011-08-21 LAB — BASIC METABOLIC PANEL
BUN: 16 mg/dL (ref 6–23)
CO2: 26 mEq/L (ref 19–32)
Calcium: 8.9 mg/dL (ref 8.4–10.5)
Chloride: 109 mEq/L (ref 96–112)
Creatinine, Ser: 1 mg/dL (ref 0.4–1.5)
GFR: 73.76 mL/min (ref >60.00)
Glucose, Bld: 81 mg/dL (ref 70–99)
Potassium: 3.8 mEq/L (ref 3.5–5.1)
Sodium: 140 mEq/L (ref 135–145)

## 2011-08-21 MED ORDER — DILTIAZEM HCL ER COATED BEADS 180 MG PO CP24: 180.0000 mg | ORAL_CAPSULE | Freq: Every day | ORAL | Status: DC

## 2011-08-21 MED ORDER — RIVAROXABAN 20 MG PO TABS: 20.0000 mg | ORAL_TABLET | Freq: Every day | ORAL | Status: DC

## 2011-08-21 NOTE — Telephone Encounter (Signed)
Spoke with pt, aware of labs. New scripts sent in and samples of xarelto placed at the front desk for pick up

## 2011-08-21 NOTE — Assessment & Plan Note (Signed)
I have asked the patient to remain off of lisinopril HCT. Follow blood pressure and adjust regimen as indicated.

## 2011-08-21 NOTE — Assessment & Plan Note (Signed)
Enzymes negative. Myoview normal. No plans for further evaluation.

## 2011-08-21 NOTE — Assessment & Plan Note (Signed)
Management per pulmonary. 

## 2011-08-21 NOTE — Assessment & Plan Note (Signed)
Patient remains in sinus rhythm. Continue Cardizem. He has embolic risk factors of hypertension and age greater than 41. Plan continue renal dose xeralto. We will need to follow his renal function closely. Check potassium, renal function and hemoglobin today. Note LV function normal.

## 2011-08-21 NOTE — Progress Notes (Signed)
HPI: Pleasant gentleman recently admitted to [facility] with atrial fibrillation. He converted spontaneously to sinus rhythm. Note the patient had an echocardiogram in [place] in November of 2012 that showed an ejection fraction of 59% with no valvular abnormalities. Cardiac markers were negative. D-dimer and VQ scan negative. TSH normal. Patient noted to have renal insufficiency and therefore was started on xeralto 15 mg daily. Outpatient nuclear study performed in February of 2013 showed normal perfusion and an ejection fraction of 65%. Patient also with significant COPD and is now followed by pulmonary. Since he was discharged, he has some dyspnea on exertion which is unchanged and attributed to COPD. No orthopnea, PND, pedal edema, palpitations, syncope or chest pain.  Current Outpatient Prescriptions  Medication Sig Dispense Refill  . allopurinol (ZYLOPRIM) 100 MG tablet Take 100 mg by mouth 2 (two) times daily.      . budesonide-formoterol (SYMBICORT) 160-4.5 MCG/ACT inhaler Inhale 2 puffs into the lungs 2 (two) times daily.  1 Inhaler  12  . Calcium Lactate 750 MG TABS Take 1 tablet by mouth 2 (two) times daily.      . Cholecalciferol (VITAMIN D3) 2000 UNITS TABS Take 1 tablet by mouth daily.      . colchicine 0.6 MG tablet Take 0.6 mg by mouth as needed.      . cyclobenzaprine (FLEXERIL) 10 MG tablet Take 10 mg by mouth 3 (three) times daily as needed. For muscle spasms      . diltiazem (CARDIZEM CD) 180 MG 24 hr capsule Take 1 capsule (180 mg total) by mouth daily.  30 capsule  3  . doxazosin (CARDURA) 8 MG tablet Take 8 mg by mouth at bedtime.      Marland Kitchen glucosamine-chondroitin 500-400 MG tablet Take 1 tablet by mouth 3 (three) times daily.      Marland Kitchen ibuprofen (ADVIL,MOTRIN) 400 MG tablet Take 400 mg by mouth every 6 (six) hours as needed. For pain      . lansoprazole (PREVACID) 30 MG capsule Take 30 mg by mouth every evening.      . latanoprost (XALATAN) 0.005 % ophthalmic solution  Place 1 drop into both eyes at bedtime.       Marland Kitchen levothyroxine (SYNTHROID, LEVOTHROID) 75 MCG tablet Take 75 mcg by mouth daily.      Marland Kitchen lisinopril-hydrochlorothiazide (PRINZIDE,ZESTORETIC) 20-25 MG per tablet Take 1 tablet by mouth daily.      . Magnesium 400 MG CAPS Take 1 tablet by mouth daily.      . mometasone (NASONEX) 50 MCG/ACT nasal spray Place 2 sprays into the nose daily.      . Multiple Vitamin (MULTIVITAMIN) capsule Take 1 capsule by mouth daily.      Marland Kitchen oxyCODONE-acetaminophen (PERCOCET) 7.5-325 MG per tablet Take 1 tablet by mouth every 4 (four) hours as needed. For pain      . pregabalin (LYRICA) 150 MG capsule Take 150 mg by mouth every evening.      . pregabalin (LYRICA) 75 MG capsule Take 75 mg by mouth 2 (two) times daily.      . Rivaroxaban (XARELTO) 15 MG TABS tablet Take 1 tablet (15 mg total) by mouth daily with supper.  30 tablet  3  . sertraline (ZOLOFT) 100 MG tablet Take 100 mg by mouth daily.      . tadalafil (CIALIS) 20 MG tablet Take 20 mg by mouth daily as needed. For erectile dysfunction      . temazepam (RESTORIL) 15 MG capsule  Take 15 mg by mouth at bedtime as needed. For sleep      . zaleplon (SONATA) 10 MG capsule Take 10 mg by mouth at bedtime.      Marland Kitchen zolpidem (AMBIEN) 10 MG tablet Take 10 mg by mouth at bedtime as needed. For sleep         Past Medical History  Diagnosis Date  . Bronchitis   . Hypertension   . Gout   . BPH (benign prostatic hyperplasia)   . Spinal compression fracture seventh vertebre  . COPD (chronic obstructive pulmonary disease)     2 liters O2 HS  . Hypoxia   . Fibromyalgia   . Peripheral neuropathy   . Osteoarthritis   . GERD (gastroesophageal reflux disease)   . Hypothyroidism   . Insomnia   . Rotator cuff tear, left   . Glaucoma     bilateral eyes  . Fatty tumor     waste and back  . Atrial fibrillation     Past Surgical History  Procedure Date  . Knee surgery   . Tonsil   . Appendectomy age 82  . Nasal concha  bullosa resection age 3  . Vasectomy age 30  . Inguinal hernia repair age 20    rt side  . Cholecystectomy age 68  . Cataract extraction     bilateral    History   Social History  . Marital Status: Single    Spouse Name: N/A    Number of Children: N/A  . Years of Education: N/A   Occupational History  . Not on file.   Social History Main Topics  . Smoking status: Former Smoker    Quit date: 07/18/1975  . Smokeless tobacco: Not on file  . Alcohol Use: Yes     1-2 drinks/day  . Drug Use: No  . Sexually Active: Not on file   Other Topics Concern  . Not on file   Social History Narrative  . No narrative on file    ROS: no fevers or chills, productive cough, hemoptysis, dysphasia, odynophagia, melena, hematochezia, dysuria, hematuria, rash, seizure activity, orthopnea, PND, pedal edema, claudication. Remaining systems are negative.  Physical Exam: Well-developed well-nourished in no acute distress.  Skin is warm and dry.  HEENT is normal.  Neck is supple. No thyromegaly.  Chest is diminished breath sounds throughout Cardiovascular exam is regular rate and rhythm.  Abdominal exam nontender or distended. No masses palpated. Extremities show no edema. neuro grossly intact  ECG sinus bradycardia at a rate of 57. Cannot rule out prior septal infarct.

## 2011-08-21 NOTE — Patient Instructions (Signed)
Your physician recommends that you schedule a follow-up appointment in: 3 MONTHS Your physician recommends that you return for lab work in: TODAY   

## 2011-08-21 NOTE — Telephone Encounter (Signed)
Message copied by Freddi Starr on Fri Aug 21, 2011  2:43 PM ------      Message from: Lewayne Bunting      Created: Fri Aug 21, 2011  1:40 PM       Renal function normalized; change xeralto to 20 mg daily      Olga Millers

## 2011-08-24 ENCOUNTER — Encounter: Payer: Self-pay | Admitting: Emergency Medicine

## 2011-08-25 ENCOUNTER — Encounter: Payer: Self-pay | Admitting: Emergency Medicine

## 2011-08-25 ENCOUNTER — Ambulatory Visit (INDEPENDENT_AMBULATORY_CARE_PROVIDER_SITE_OTHER): Payer: Medicare Other | Admitting: Emergency Medicine

## 2011-08-25 DIAGNOSIS — G4733 Obstructive sleep apnea (adult) (pediatric): Secondary | ICD-10-CM | POA: Diagnosis not present

## 2011-08-25 DIAGNOSIS — J449 Chronic obstructive pulmonary disease, unspecified: Secondary | ICD-10-CM

## 2011-08-25 NOTE — Assessment & Plan Note (Signed)
No longer on CPAP, needs repeat study and CPAP setup

## 2011-08-25 NOTE — Patient Instructions (Signed)
Please continue your Symbicort 2 puffs twice a day Take xopenex 2 puffs up to every 4 hours if needed for shortness of breath.  We will perform walking oximetry today We will obtain your lung and heart testing from [place] We will order a repeat sleep study Follow with Dr Delton Coombes in 6 weeks or sooner if you have any problems.

## 2011-08-25 NOTE — Progress Notes (Signed)
Subjective:    Patient ID: Jonathan [name], male    DOB: May 23, 1933, 78 y.o.   MRN: [id number]  HPI 78 yo man, former smoker, hx of A Fib, COPD on nocturnal O2, GERD, also OSA not on CPAP for over 4 yrs. He has had PFT done in [place] Fall 2012 that supported COPD, walking oximetry that showed ? Hypoxemia, ONO with desats and started on . Began to have AE +/- PNA in 1/13 that never really got better. He was admitted to [facility] 2/4-07/21/11 for an AE-COPD associated with A fib + RVR.  Was rx steroids, abx, BD's. He is better but is limited to some degree, not back to prior baseline. He is low on energy. He is having trouble sleeping, difficulty falling asleep and staying asleep. He snores, no witnessed apnea. No real wheezing or coughing at this time.    Review of Systems  Constitutional: Negative.  Negative for fever and unexpected weight change.  HENT: Positive for congestion and dental problem. Negative for ear pain, nosebleeds, sore throat, rhinorrhea, sneezing, trouble swallowing, postnasal drip and sinus pressure.   Eyes: Negative.  Negative for redness and itching.  Respiratory: Positive for cough and shortness of breath. Negative for chest tightness and wheezing.   Cardiovascular: Positive for chest pain and palpitations. Negative for leg swelling.  Gastrointestinal: Negative.  Negative for nausea and vomiting.  Genitourinary: Negative.  Negative for dysuria.  Musculoskeletal: Positive for arthralgias. Negative for joint swelling.  Skin: Negative.  Negative for rash.  Neurological: Negative.  Negative for headaches.  Hematological: Negative.  Does not bruise/bleed easily.  Psychiatric/Behavioral: Negative.  Negative for dysphoric mood. The patient is not nervous/anxious.     Past Medical History  Diagnosis Date  . Bronchitis   . Hypertension   . Gout   . BPH (benign prostatic hyperplasia)   . Spinal compression fracture seventh vertebre  . COPD (chronic obstructive pulmonary  disease)     2 liters O2 HS  . Hypoxia   . Fibromyalgia   . Peripheral neuropathy   . Osteoarthritis   . GERD (gastroesophageal reflux disease)   . Hypothyroidism   . Insomnia   . Rotator cuff tear, left   . Glaucoma     bilateral eyes  . Fatty tumor     waste and back  . Atrial fibrillation      Family History  Problem Relation Age of Onset  . Coronary artery disease Brother   . Heart disease Father   . Arthritis Mother   . Lung cancer Mother   . Lung cancer Father   . Kidney cancer Father   . Prostate cancer Father      History   Social History  . Marital Status: Single    Spouse Name: N/A    Number of Children: N/A  . Years of Education: N/A   Occupational History  . RETIRED    Social History Main Topics  . Smoking status: Former Smoker -- 2.0 packs/day for 10 years    Types: Cigarettes    Quit date: 07/18/1975  . Smokeless tobacco: Not on file  . Alcohol Use: Yes     1-2 drinks/day  . Drug Use: No  . Sexually Active: Not on file   Other Topics Concern  . Not on file   Social History Narrative  . No narrative on file     No Known Allergies   Outpatient Prescriptions Prior to Visit  Medication Sig Dispense Refill  . allopurinol (  ZYLOPRIM) 100 MG tablet Take 100 mg by mouth 2 (two) times daily.      . B Complex-Folic Acid (SUPER B-50 COMPLEX PO) Take 1 tablet by mouth daily.      . budesonide-formoterol (SYMBICORT) 160-4.5 MCG/ACT inhaler Inhale 2 puffs into the lungs 2 (two) times daily.  1 Inhaler  12  . Calcium Lactate 750 MG TABS Take 1 tablet by mouth 2 (two) times daily.      . Cholecalciferol (VITAMIN D3) 2000 UNITS TABS Take 1 tablet by mouth daily.      . colchicine 0.6 MG tablet Take 0.6 mg by mouth as needed.      . cyclobenzaprine (FLEXERIL) 10 MG tablet Take 10 mg by mouth 3 (three) times daily as needed. For muscle spasms      . diltiazem (CARDIZEM CD) 180 MG 24 hr capsule Take 1 capsule (180 mg total) by mouth daily.  90 capsule  4    . doxazosin (CARDURA) 8 MG tablet Take 8 mg by mouth at bedtime.      Marland Kitchen glucosamine-chondroitin 500-400 MG tablet Take 1 tablet by mouth 3 (three) times daily.      Marland Kitchen ibuprofen (ADVIL,MOTRIN) 400 MG tablet Take 400 mg by mouth every 6 (six) hours as needed. For pain      . lansoprazole (PREVACID) 30 MG capsule Take 30 mg by mouth every evening.      . latanoprost (XALATAN) 0.005 % ophthalmic solution Place 1 drop into both eyes at bedtime.       Marland Kitchen levothyroxine (SYNTHROID, LEVOTHROID) 75 MCG tablet Take 75 mcg by mouth daily.      . Magnesium 400 MG CAPS Take 1 tablet by mouth daily.      . Melatonin 5 MG TABS Take 1 tablet by mouth daily.      . mometasone (NASONEX) 50 MCG/ACT nasal spray Place 2 sprays into the nose daily.      . Multiple Vitamin (MULTIVITAMIN) capsule Take 1 capsule by mouth daily.      Marland Kitchen oxyCODONE-acetaminophen (PERCOCET) 7.5-325 MG per tablet Take 1 tablet by mouth every 4 (four) hours as needed. For pain      . pregabalin (LYRICA) 150 MG capsule Take 150 mg by mouth every evening.      . pregabalin (LYRICA) 75 MG capsule Take 75 mg by mouth 2 (two) times daily.      . Rivaroxaban 20 MG TABS Take 20 mg by mouth daily with supper.  90 tablet  4  . sertraline (ZOLOFT) 100 MG tablet Take 100 mg by mouth daily.      . tadalafil (CIALIS) 20 MG tablet Take 20 mg by mouth daily as needed. For erectile dysfunction      . temazepam (RESTORIL) 15 MG capsule Take 15 mg by mouth at bedtime as needed. For sleep      . zaleplon (SONATA) 10 MG capsule Take 10 mg by mouth at bedtime.      Marland Kitchen zolpidem (AMBIEN) 10 MG tablet Take 10 mg by mouth at bedtime as needed. For sleep           Objective:   Physical Exam Filed Vitals:   08/25/11 1451  BP: 122/70  Pulse: 64  Temp: 97.5 F (36.4 C)    Gen: Pleasant, well-nourished, in no distress,  normal affect  ENT: No lesions,  mouth clear,  oropharynx clear, no postnasal drip  Neck: No JVD, no TMG, no carotid bruits  Lungs: No use  of accessory muscles, no dullness to percussion, clear without rales or rhonchi  Cardiovascular: RRR, heart sounds normal, no murmur or gallops, no peripheral edema  Musculoskeletal: No deformities, no cyanosis or clubbing  Neuro: alert, non focal  Skin: Warm, no lesions or rashes     Assessment & Plan:  COPD (chronic obstructive pulmonary disease) With recent AE. Severity unclear but he reports exertional desats on stress testing in [place].  - continue symbicort  - give him xopenex to use prn - walking oximetry today - rov 6 weeks  Sleep apnea, obstructive No longer on CPAP, needs repeat study and CPAP setup

## 2011-08-25 NOTE — Assessment & Plan Note (Signed)
With recent AE. Severity unclear but he reports exertional desats on stress testing in [place].  - continue symbicort  - give him xopenex to use prn - walking oximetry today - rov 6 weeks

## 2011-09-03 NOTE — Progress Notes (Signed)
Addended by: Judithe Modest D on: 09/03/2011 11:17 AM   Modules accepted: Orders

## 2011-09-08 ENCOUNTER — Telehealth: Payer: Self-pay | Admitting: Cardiology

## 2011-09-08 NOTE — Telephone Encounter (Signed)
Spoke with [facility], questions regarding xarelto answered.

## 2011-09-08 NOTE — Telephone Encounter (Signed)
New Msg: Pharmacy calling wanting to clarify directions of xatelto. Please return pharmacy call to clarify/discuss further.  Ref# [id number]

## 2011-09-16 ENCOUNTER — Telehealth: Payer: Self-pay | Admitting: Cardiology

## 2011-09-16 NOTE — Telephone Encounter (Signed)
Pt calling re medication [facility], won;t be shipped until 09-17-11, requesting samples of xarelto

## 2011-09-16 NOTE — Telephone Encounter (Signed)
Spoke with pt, samples placed at the front desk for pick up 

## 2011-09-17 ENCOUNTER — Ambulatory Visit (HOSPITAL_BASED_OUTPATIENT_CLINIC_OR_DEPARTMENT_OTHER): Payer: Medicare Other | Attending: Emergency Medicine | Admitting: Radiology

## 2011-09-17 VITALS — Ht 67.0 in | Wt 195.0 lb

## 2011-09-17 DIAGNOSIS — G4730 Sleep apnea, unspecified: Secondary | ICD-10-CM

## 2011-09-17 DIAGNOSIS — G4733 Obstructive sleep apnea (adult) (pediatric): Secondary | ICD-10-CM | POA: Diagnosis not present

## 2011-09-18 ENCOUNTER — Telehealth: Payer: Self-pay | Admitting: Emergency Medicine

## 2011-09-18 NOTE — Telephone Encounter (Signed)
Received copies from [facility] ,on 09/18/11. Forwarded 23  pages to Dr. Delton Coombes ,for review.

## 2011-09-28 ENCOUNTER — Telehealth: Payer: Self-pay | Admitting: Emergency Medicine

## 2011-09-28 NOTE — Telephone Encounter (Signed)
Received copies from [place] [facility] ,on 09/28/11 . Forwarded 16 pages to Dr.Byrum ,for review.

## 2011-09-30 DIAGNOSIS — M62838 Other muscle spasm: Secondary | ICD-10-CM | POA: Diagnosis not present

## 2011-09-30 DIAGNOSIS — I491 Atrial premature depolarization: Secondary | ICD-10-CM | POA: Diagnosis not present

## 2011-09-30 DIAGNOSIS — G4733 Obstructive sleep apnea (adult) (pediatric): Secondary | ICD-10-CM | POA: Diagnosis not present

## 2011-09-30 NOTE — Procedures (Signed)
Jonathan [name], Jonathan [name]              ACCOUNT NO.:  [id number]  MEDICAL RECORD NO.:  [id number]          PATIENT TYPE:  OUT  LOCATION:  SLEEP CENTER                 FACILITY:  [facility]  PHYSICIAN:  Barbaraann Share, MD,FCCPDATE OF BIRTH:  1933/04/22  DATE OF STUDY:  09/17/2011                           NOCTURNAL POLYSOMNOGRAM  REFERRING PHYSICIAN:  Leslye Peer, MD  INDICATION FOR STUDY:  Hypersomnia with sleep apnea.  EPWORTH SLEEPINESS SCORE:  Five.  MEDICATIONS:  SLEEP ARCHITECTURE:  The patient had a total sleep time of 213 minutes with no slow-wave sleep or REM noted.  Sleep onset latency was prolonged at 60 minutes, and the patient never achieved REM during the night. Sleep efficiency was poor at 58%.  RESPIRATORY DATA:  The patient was found to have 65 obstructive and central apneas as well as 29 obstructive hypopneas, giving him an apnea- hypopnea index of 27 events per hour.  The events occurred in all body positions, but were much more prominent during supine sleep.  There was moderate to very loud snoring noted throughout.  The patient did not meet split night protocol secondary to the majority of his events occurring well after 1 a.m.  OXYGEN DATA:  There was desaturation as low as 87% with the patient's obstructive events.  CARDIAC DATA:  PACs noted, but no clinically significant arrhythmias were seen.  MOVEMENT-PARASOMNIA:  The patient was found to have small numbers of leg jerks that did not appear to be clinically significant.  There were no abnormal behaviors noted.  IMPRESSIONS-RECOMMENDATIONS: 1. Moderate obstructive sleep apnea/hypopnea syndrome with an AHI of     27 events per hour and oxygen desaturation as low as 87%.     Treatment for this degree of sleep apnea can include a trial of     weight loss, upper airway surgery, dental appliance, and also CPAP.     Clinical correlation is suggested.  The patient did not meet split     night protocol  secondary to the majority of these events occurring     after 1 a.m., leaving very little time for     appropriate CPAP titration. 2. Premature atrial contractions noted, but no clinically significant     arrhythmias were seen.     Barbaraann Share, MD,FCCP Diplomate, American Board of Sleep Medicine    [facility]MED[facility]  D:  09/30/2011 08:13:28  T:  09/30/2011 08:50:57  Job:  [id number]

## 2011-10-05 ENCOUNTER — Telehealth: Payer: Self-pay | Admitting: Cardiology

## 2011-10-05 DIAGNOSIS — Z79899 Other long term (current) drug therapy: Secondary | ICD-10-CM

## 2011-10-05 DIAGNOSIS — R609 Edema, unspecified: Secondary | ICD-10-CM

## 2011-10-05 NOTE — Telephone Encounter (Signed)
New Problem:     Patient has been experiencing significant edema in his lower extremeties since he has stopped his lisinopril. Patient would like to know what the other medication Dr. Jens Som had in mind was.  Please call back, patient is aware that you are not here when this message was taken.

## 2011-10-06 MED ORDER — FUROSEMIDE 20 MG PO TABS: 20.0000 mg | ORAL_TABLET | Freq: Every day | ORAL | Status: DC | PRN

## 2011-10-06 NOTE — Telephone Encounter (Signed)
Spoke with pt, he has swelling up his leg and in his feet. He denies any increased SOB. Uses [facility] [address]. Will forward for dr Jens Som review

## 2011-10-06 NOTE — Telephone Encounter (Signed)
Spoke with pt, Aware of dr crenshaw's recommendations.  °

## 2011-10-06 NOTE — Telephone Encounter (Signed)
Lasix 20 mg dialy PRN BMET one week FUOV Olga Millers

## 2011-10-07 ENCOUNTER — Ambulatory Visit (INDEPENDENT_AMBULATORY_CARE_PROVIDER_SITE_OTHER): Payer: Medicare Other | Admitting: Emergency Medicine

## 2011-10-07 ENCOUNTER — Encounter: Payer: Self-pay | Admitting: Emergency Medicine

## 2011-10-07 VITALS — BP 142/74 | HR 66 | Temp 97.8°F | Ht 66.5 in | Wt 207.4 lb

## 2011-10-07 DIAGNOSIS — J449 Chronic obstructive pulmonary disease, unspecified: Secondary | ICD-10-CM

## 2011-10-07 DIAGNOSIS — G4733 Obstructive sleep apnea (adult) (pediatric): Secondary | ICD-10-CM | POA: Diagnosis not present

## 2011-10-07 NOTE — Progress Notes (Signed)
  Subjective:    Patient ID: Jonathan [name], male    DOB: [birth year], 78 y.o.   MRN: [id number] HPI 78 yo man, former smoker, hx of A Fib, COPD on nocturnal O2, GERD, also OSA not on CPAP for over 4 yrs. He has had PFT done in [place] Fall 2012 that supported COPD, walking oximetry that showed ? Hypoxemia, ONO with desats and started on . Began to have AE +/- PNA in 1/13 that never really got better. He was admitted to [facility] 2/4-07/21/11 for an AE-COPD associated with A fib + RVR.  Was rx steroids, abx, BD's. He is better but is limited to some degree, not back to prior baseline. He is low on energy. He is having trouble sleeping, difficulty falling asleep and staying asleep. He snores, no witnessed apnea. No real wheezing or coughing at this time.   ROV 10/07/11 -- 78yom hx of A Fib, COPD on nocturnal O2, GERD, also OSA not on CPAP for over 5yrs. Returns after PSG 09/17/11, shows AHI 27/hour. Also obtained his PFT from [place] from 11/12 - FEV1 71% predicted with positive BD response. Still having trouble w exertion, limited by arthritis more than breathing.Tolerating Symbicort, not really sure it is helping him.       Objective:   Physical Exam Filed Vitals:   10/07/11 0940  BP: 142/74  Pulse: 66  Temp: 97.8 F (36.6 C)    Gen: Pleasant, well-nourished, in no distress,  normal affect  ENT: No lesions,  mouth clear,  oropharynx clear, no postnasal drip  Neck: No JVD, no TMG, no carotid bruits  Lungs: No use of accessory muscles, no dullness to percussion, clear without rales or rhonchi  Cardiovascular: RRR, heart sounds normal, no murmur or gallops, no peripheral edema  Musculoskeletal: No deformities, no cyanosis or clubbing  Neuro: alert, non focal  Skin: Warm, no lesions or rashes     Assessment & Plan:  COPD (chronic obstructive pulmonary disease) Will try stopping symbicort to see if he misses it Restart if he misses SABA prn    Sleep apnea, obstructive Will arrange for CPAP  home titration - will be a bit complicated since he is going to [place] with the device. Will try to arrange for [place] to get results and adjust remotely via modem

## 2011-10-07 NOTE — Patient Instructions (Signed)
Try stopping Symbicort for 2 weeks to see if you miss it. It your breathing worsens or if you find you begin to need your xopenex inhaler then go back on Symbicort twice a day We will arrange for a home CPAP auto-titration Follow with Dr Delton Coombes in 4 months

## 2011-10-07 NOTE — Assessment & Plan Note (Signed)
Will arrange for CPAP home titration - will be a bit complicated since he is going to [place] with the device. Will try to arrange for [place] to get results and adjust remotely via modem

## 2011-10-07 NOTE — Assessment & Plan Note (Addendum)
Will try stopping symbicort to see if he misses it Restart if he misses SABA prn

## 2011-10-08 ENCOUNTER — Telehealth: Payer: Self-pay | Admitting: Emergency Medicine

## 2011-10-08 NOTE — Telephone Encounter (Signed)
I don't think he needs O2 bled in. We may check ONO later to see if we need to add the O2 back. Thanks

## 2011-10-08 NOTE — Telephone Encounter (Signed)
Called [facility], spoke with Gwynneth Munson.  Advised him of below statement per RB.  He verbalized understanding of this.  Nothing further needed at this time.

## 2011-10-08 NOTE — Telephone Encounter (Signed)
RB, do you want the pt on CPAP alone, or with o2 bled in? Please advise, thanks!

## 2011-10-09 ENCOUNTER — Telehealth: Payer: Self-pay | Admitting: Emergency Medicine

## 2011-10-09 NOTE — Telephone Encounter (Signed)
Spoke with pt. He states that he would like to have his o2 d/c'ed if this is okay with RB. He states that he was only using this at night, and will now be using CPAP. He is weary about this though, b/c is traveling soon to [place] and the air is thinner there and so he wants RB's recs first. Please advise, thanks!

## 2011-10-09 NOTE — Telephone Encounter (Signed)
I don't want to do this yet. We will perform an ONO after he is stable on CPAP to decide whether to d/c. He should  Not need to take it to [place] for now, I just don;t want to d/c it until I'm sure we're truly done with it.

## 2011-10-09 NOTE — Telephone Encounter (Signed)
LMTCB x 1 

## 2011-10-12 ENCOUNTER — Other Ambulatory Visit (INDEPENDENT_AMBULATORY_CARE_PROVIDER_SITE_OTHER): Payer: Medicare Other

## 2011-10-12 DIAGNOSIS — Z79899 Other long term (current) drug therapy: Secondary | ICD-10-CM | POA: Diagnosis not present

## 2011-10-12 DIAGNOSIS — R609 Edema, unspecified: Secondary | ICD-10-CM

## 2011-10-12 LAB — BASIC METABOLIC PANEL
BUN: 20 mg/dL (ref 6–23)
CO2: 28 mEq/L (ref 19–32)
Calcium: 8.8 mg/dL (ref 8.4–10.5)
Chloride: 103 mEq/L (ref 96–112)
Creatinine, Ser: 1.1 mg/dL (ref 0.4–1.5)
GFR: 72.92 mL/min (ref >60.00)
Glucose, Bld: 91 mg/dL (ref 70–99)
Potassium: 3.6 mEq/L (ref 3.5–5.1)
Sodium: 140 mEq/L (ref 135–145)

## 2011-10-12 NOTE — Telephone Encounter (Signed)
Pt returned call. Jonathan [name]  

## 2011-10-12 NOTE — Telephone Encounter (Signed)
Ok thank you 

## 2011-10-12 NOTE — Telephone Encounter (Signed)
lmomtcb  

## 2011-10-12 NOTE — Telephone Encounter (Signed)
Called, spoke with pt.  I informed him of below per Dr. Delton Coombes.  However, pt states he had [facility] come pick up the o2 yesterday as he will not be back here until June 20.  Pt states he has arrangements made to where he has o2 for [place] set up while he is there and will have o2 IF NEEDED when he comes back here on June 20.    Will forward msg to RB so he is aware pt has DME pick up o2.

## 2011-10-20 ENCOUNTER — Telehealth: Payer: Self-pay | Admitting: Cardiology

## 2011-10-20 NOTE — Telephone Encounter (Signed)
Spoke with pt, about one month ago we gave him lasix 20 mg for swelling he was having in his feet and ankles, he called to report no change in the edema. He denies any SOB and states his whole body is retaining fluid. He also wanted to know if any of his meds could cause problems with his memory. He reports over the last week he has noticed his memory is getting worse. Explained will research and let him know. Will discuss with dr Jens Som pt edema. Will forward for dr Jens Som review

## 2011-10-20 NOTE — Telephone Encounter (Signed)
Chang lasix to 40 mg daily and bmet one week [facility]

## 2011-10-20 NOTE — Telephone Encounter (Signed)
Please return call to patient concerning lasix Rx and complication from other medications.  Patient can be reached at [phone].

## 2011-10-21 NOTE — Telephone Encounter (Signed)
Spoke with pt, Aware of dr Ludwig Clarks recommendations. Script for labs and increased furosemide mailed to pt. [place].

## 2011-11-10 DIAGNOSIS — Z961 Presence of intraocular lens: Secondary | ICD-10-CM | POA: Diagnosis not present

## 2011-11-10 DIAGNOSIS — H43399 Other vitreous opacities, unspecified eye: Secondary | ICD-10-CM | POA: Diagnosis not present

## 2011-11-10 DIAGNOSIS — H40059 Ocular hypertension, unspecified eye: Secondary | ICD-10-CM | POA: Diagnosis not present

## 2011-11-25 DIAGNOSIS — H40059 Ocular hypertension, unspecified eye: Secondary | ICD-10-CM | POA: Diagnosis not present

## 2011-12-03 ENCOUNTER — Ambulatory Visit: Payer: Medicare Other | Admitting: Emergency Medicine

## 2011-12-04 ENCOUNTER — Encounter: Payer: Self-pay | Admitting: Emergency Medicine

## 2011-12-04 ENCOUNTER — Encounter: Payer: Self-pay | Admitting: Cardiology

## 2011-12-04 ENCOUNTER — Ambulatory Visit (INDEPENDENT_AMBULATORY_CARE_PROVIDER_SITE_OTHER): Payer: Medicare Other | Admitting: Emergency Medicine

## 2011-12-04 ENCOUNTER — Ambulatory Visit (INDEPENDENT_AMBULATORY_CARE_PROVIDER_SITE_OTHER): Payer: Medicare Other | Admitting: Cardiology

## 2011-12-04 VITALS — BP 132/64 | HR 68 | Temp 97.7°F | Ht 67.0 in | Wt 205.4 lb

## 2011-12-04 VITALS — BP 139/77 | HR 58 | Wt 203.0 lb

## 2011-12-04 DIAGNOSIS — J449 Chronic obstructive pulmonary disease, unspecified: Secondary | ICD-10-CM | POA: Diagnosis not present

## 2011-12-04 DIAGNOSIS — G4733 Obstructive sleep apnea (adult) (pediatric): Secondary | ICD-10-CM | POA: Diagnosis not present

## 2011-12-04 DIAGNOSIS — I4891 Unspecified atrial fibrillation: Secondary | ICD-10-CM

## 2011-12-04 DIAGNOSIS — J309 Allergic rhinitis, unspecified: Secondary | ICD-10-CM | POA: Diagnosis not present

## 2011-12-04 DIAGNOSIS — I10 Essential (primary) hypertension: Secondary | ICD-10-CM

## 2011-12-04 DIAGNOSIS — J302 Other seasonal allergic rhinitis: Secondary | ICD-10-CM

## 2011-12-04 HISTORY — DX: Essential (primary) hypertension: I10

## 2011-12-04 HISTORY — DX: Other seasonal allergic rhinitis: J30.2

## 2011-12-04 LAB — BASIC METABOLIC PANEL
BUN: 24 mg/dL — ABNORMAL HIGH (ref 6–23)
CO2: 27 mEq/L (ref 19–32)
Calcium: 9.1 mg/dL (ref 8.4–10.5)
Chloride: 106 mEq/L (ref 96–112)
Creatinine, Ser: 1.4 mg/dL (ref 0.4–1.5)
GFR: 51.45 mL/min — ABNORMAL LOW (ref >60.00)
Glucose, Bld: 101 mg/dL — ABNORMAL HIGH (ref 70–99)
Potassium: 3.8 mEq/L (ref 3.5–5.1)
Sodium: 141 mEq/L (ref 135–145)

## 2011-12-04 MED ORDER — FLUTICASONE PROPIONATE 50 MCG/ACT NA SUSP: 2.0000 | Freq: Two times a day (BID) | NASAL | Status: DC

## 2011-12-04 MED ORDER — LORATADINE 10 MG PO TABS: 10.0000 mg | ORAL_TABLET | Freq: Every day | ORAL | Status: DC

## 2011-12-04 NOTE — Assessment & Plan Note (Signed)
Blood pressure controlled. Continue present medications. 

## 2011-12-04 NOTE — Progress Notes (Signed)
  Subjective:    Patient ID: Jonathan [name], male    DOB: Oct 13, 1932, 78 y.o.   MRN: [id number] HPI 78 yo man, former smoker, hx of A Fib, COPD on nocturnal O2, GERD, also OSA not on CPAP for over 4 yrs. He has had PFT done in [place] Fall 2012 that supported COPD, walking oximetry that showed ? Hypoxemia, ONO with desats and started on . Began to have AE +/- PNA in 1/13 that never really got better. He was admitted to [facility] 2/4-07/21/11 for an AE-COPD associated with A fib + RVR.  Was rx steroids, abx, BD's. He is better but is limited to some degree, not back to prior baseline. He is low on energy. He is having trouble sleeping, difficulty falling asleep and staying asleep. He snores, no witnessed apnea. No real wheezing or coughing at this time.   ROV 10/07/11 -- 78yom hx of A Fib, COPD on nocturnal O2, GERD, also OSA not on CPAP for over 21yrs. Returns after PSG 09/17/11, shows AHI 27/hour. Also obtained his PFT from [place] from 11/12 - FEV1 71% predicted with positive BD response. Still having trouble w exertion, limited by arthritis more than breathing.Tolerating Symbicort, not really sure it is helping him.    ROV 12/04/11 -- 78yom hx of A Fib, COPD on nocturnal O2, GERD, OSA. We recently started back on CPAP, his download data 5/18-6/15 shows he wears it longer than 4 hours for 58% of the nights. He is working on increasing compliance. All barriers discussed, should be able to overcome. He has new mask that is better. He now has O2 bled in and has been wearing it all night.  Last time we stopped Symbicort - he hasn't missed it. Hasn't used the SABA.      Objective:   Physical Exam Filed Vitals:   12/04/11 1446  BP: 132/64  Pulse: 68  Temp: 97.7 F (36.5 C)    Gen: Pleasant, well-nourished, in no distress,  normal affect  ENT: No lesions,  mouth clear,  oropharynx clear, no postnasal drip  Neck: No JVD, no TMG, no carotid bruits  Lungs: No use of accessory muscles, no dullness to percussion,  clear without rales or rhonchi  Cardiovascular: RRR, heart sounds normal, no murmur or gallops, no peripheral edema  Musculoskeletal: No deformities, no cyanosis or clubbing  Neuro: alert, non focal  Skin: Warm, no lesions or rashes     Assessment & Plan:  Sleep apnea, obstructive Tolerating CPAP, especially now that he has a new mask. We spoke about compliance and he is working hard on this. I believe he will be able to use reliably > 70% of nights.   COPD (chronic obstructive pulmonary disease) Hasn't missed Symbicort - continue SABA prn.   Allergic rhinitis, seasonal Fluticasone and loratadiine

## 2011-12-04 NOTE — Assessment & Plan Note (Signed)
Management per primary care. 

## 2011-12-04 NOTE — Patient Instructions (Addendum)
Your physician wants you to follow-up in: 6 MONTHS WITH DR CRENSHAW You will receive a reminder letter in the mail two months in advance. If you don't receive a letter, please call our office to schedule the follow-up appointment.   Your physician recommends that you HAVE LAB WORK TODAY 

## 2011-12-04 NOTE — Assessment & Plan Note (Signed)
Tolerating CPAP, especially now that he has a new mask. We spoke about compliance and he is working hard on this. I believe he will be able to use reliably > 70% of nights.

## 2011-12-04 NOTE — Assessment & Plan Note (Signed)
Fluticasone and loratadiine

## 2011-12-04 NOTE — Progress Notes (Signed)
HPI: Pleasant gentleman previously admitted to [facility] with atrial fibrillation. He converted spontaneously to sinus rhythm. Note the patient had an echocardiogram in [place] in November of 2012 that showed an ejection fraction of 59% with no valvular abnormalities. Cardiac markers were negative. D-dimer and VQ scan negative. TSH normal. Patient noted to have renal insufficiency and therefore was started on xeralto 15 mg daily. Outpatient nuclear study performed in February of 2013 showed normal perfusion and an ejection fraction of 65%. Patient also with significant COPD and is now followed by pulmonary. Since I last saw him in March of 2013, he contacted the office with lower ext edema and we added lasix. He denies increased dyspnea, chest pain, palpitations, syncope or bleeding. His pedal edema is controlled with 20 mg of Lasix daily.   Current Outpatient Prescriptions  Medication Sig Dispense Refill  . allopurinol (ZYLOPRIM) 100 MG tablet Take 100 mg by mouth 2 (two) times daily.      . Calcium Lactate 750 MG TABS Take 1 tablet by mouth 2 (two) times daily.      . colchicine 0.6 MG tablet Take 0.6 mg by mouth as needed.      . cyclobenzaprine (FLEXERIL) 10 MG tablet Take 10 mg by mouth at bedtime. For muscle spasms      . diltiazem (CARDIZEM CD) 180 MG 24 hr capsule Take 1 capsule (180 mg total) by mouth daily.  90 capsule  4  . doxazosin (CARDURA) 8 MG tablet Take 8 mg by mouth at bedtime.      . furosemide (LASIX) 20 MG tablet Take 1 tablet (20 mg total) by mouth daily as needed.  30 tablet  11  . HYDROcodone-homatropine (HYCODAN) 5-1.5 MG/5ML syrup As directed      . ibuprofen (ADVIL,MOTRIN) 400 MG tablet Take 400 mg by mouth every 6 (six) hours as needed. For pain      . lansoprazole (PREVACID) 30 MG capsule Take 30 mg by mouth every evening.      . latanoprost (XALATAN) 0.005 % ophthalmic solution Place 1 drop into both eyes at bedtime.       . levalbuterol (XOPENEX HFA)  45 MCG/ACT inhaler Inhale 2 puffs into the lungs every 6 (six) hours as needed.      Marland Kitchen levothyroxine (SYNTHROID, LEVOTHROID) 75 MCG tablet Take 75 mcg by mouth daily.      . Melatonin 3 MG CAPS Take 2 capsules by mouth at bedtime.      Marland Kitchen oxycodone (OXY-IR) 5 MG capsule Take 5 mg by mouth every 6 (six) hours as needed.      . pregabalin (LYRICA) 75 MG capsule Take 75 mg by mouth 2 (two) times daily.      . Rivaroxaban 20 MG TABS Take 20 mg by mouth daily with supper.  90 tablet  4  . sertraline (ZOLOFT) 100 MG tablet Take 100 mg by mouth daily.      . tadalafil (CIALIS) 20 MG tablet Take 20 mg by mouth daily as needed. For erectile dysfunction      . temazepam (RESTORIL) 15 MG capsule Take 15 mg by mouth at bedtime as needed. For sleep      . zaleplon (SONATA) 10 MG capsule Take 10 mg by mouth at bedtime.      Marland Kitchen zolpidem (AMBIEN) 10 MG tablet Take 5 mg by mouth at bedtime as needed. For sleep      . DISCONTD: albuterol-ipratropium (COMBIVENT) 18-103 MCG/ACT inhaler Inhale 2  puffs into the lungs every 6 (six) hours as needed. For shortness of breath      . DISCONTD: calcium-vitamin D (OSCAL WITH D) 500-200 MG-UNIT per tablet Take 1 tablet by mouth daily.      Marland Kitchen DISCONTD: diphenhydrAMINE (SOMINEX) 25 MG tablet Take 50 mg by mouth at bedtime as needed. For sleep      . DISCONTD: mometasone (NASONEX) 50 MCG/ACT nasal spray Place 2 sprays into the nose daily.         Past Medical History  Diagnosis Date  . Bronchitis   . Hypertension   . Gout   . BPH (benign prostatic hyperplasia)   . Spinal compression fracture seventh vertebre  . COPD (chronic obstructive pulmonary disease)     2 liters O2 HS  . Hypoxia   . Fibromyalgia   . Peripheral neuropathy   . Osteoarthritis   . GERD (gastroesophageal reflux disease)   . Hypothyroidism   . Insomnia   . Rotator cuff tear, left   . Glaucoma     bilateral eyes  . Fatty tumor     waste and back  . Atrial fibrillation     Past Surgical History    Procedure Date  . Knee surgery   . Tonsil   . Appendectomy age 34  . Nasal concha bullosa resection age 63  . Vasectomy age 85  . Inguinal hernia repair age 45    rt side  . Cholecystectomy age 66  . Cataract extraction     bilateral    History   Social History  . Marital Status: Single    Spouse Name: N/A    Number of Children: N/A  . Years of Education: N/A   Occupational History  . RETIRED    Social History Main Topics  . Smoking status: Former Smoker -- 2.0 packs/day for 10 years    Types: Cigarettes    Quit date: 07/18/1975  . Smokeless tobacco: Not on file  . Alcohol Use: Yes     1-2 drinks/day  . Drug Use: No  . Sexually Active: Not on file   Other Topics Concern  . Not on file   Social History Narrative  . No narrative on file    ROS: no fevers or chills, productive cough, hemoptysis, dysphasia, odynophagia, melena, hematochezia, dysuria, hematuria, rash, seizure activity, orthopnea, PND, pedal edema, claudication. Remaining systems are negative.  Physical Exam: Well-developed well-nourished in no acute distress.  Skin is warm and dry.  HEENT is normal.  Neck is supple.  Chest is clear to auscultation with normal expansion.  Cardiovascular exam is regular rate and rhythm.  Abdominal exam nontender or distended. No masses palpated. Extremities show no edema. neuro grossly intact  ECG sinus bradycardia at a rate of 58. Axis normal. No ST changes. Cannot rule out prior septal infarct.

## 2011-12-04 NOTE — Patient Instructions (Addendum)
Continue your CPAP every night Use your albuterol 2 puffs if needed for shortness of breath Start loratadine 10mg  daily and fluticasone 2 sprays each nostril twice a day Follow with Dr Delton Coombes in 6 months

## 2011-12-04 NOTE — Assessment & Plan Note (Signed)
Hasn't missed Symbicort - continue SABA prn.

## 2011-12-04 NOTE — Assessment & Plan Note (Signed)
Patient remains in sinus rhythm. Continue Cardizem. Embolic risk factors of age greater than 50 and hypertension. Continue xeralto. Check renal function as he is on renal dose xeralto.

## 2011-12-22 ENCOUNTER — Encounter: Payer: Self-pay | Admitting: Emergency Medicine

## 2011-12-30 DIAGNOSIS — H04129 Dry eye syndrome of unspecified lacrimal gland: Secondary | ICD-10-CM | POA: Diagnosis not present

## 2011-12-30 DIAGNOSIS — H539 Unspecified visual disturbance: Secondary | ICD-10-CM | POA: Diagnosis not present

## 2012-01-08 ENCOUNTER — Telehealth: Payer: Self-pay | Admitting: Emergency Medicine

## 2012-01-08 NOTE — Telephone Encounter (Signed)
Dr. Delton [name] patient---I spoke with the pt and he states he is moving from [place] to [place] on Tuesday. He is wanting an order to d/c his concentrator that he uses with his CPAP so he can leave it at the company in [place]. The pt states he has it set up to have oxygen when he arrives here on Tuesday. I advised the pt to call the MD in [place] that originally prescribed the oxygen and he states he did but he has not seen them in almost a year and they would not do the order. I advised the pt that Dr. Delton [name] is not back in the office before Tuesday so I will ask another MD in the office. Pt states understanding. Please advise. Thanks. Carron Curie, CMA

## 2012-01-08 NOTE — Telephone Encounter (Signed)
Per CY, pt needs to have an ONO to determine if concentrator can be d/c'd. Per CY Dr. Delton Coombes needs to determine this. I will forward message to Dr. Delton Coombes to address on Wed. Please advise if ok to d/c concentrator.Carron Curie, CMA

## 2012-01-10 NOTE — Telephone Encounter (Signed)
Please give the order to the [place] DME that we are d/c'ing the O2 and will be arranging via a local company. He doesn't need an ONO to decide this. We will sort out the local orders when he gets back to [facility]

## 2012-01-11 NOTE — Telephone Encounter (Signed)
Spoke with pt and notified of recs per RB. Order to have the o2 d/c'ed was faxed to [facility] at [phone]. Pt states nothing further needed for now.

## 2012-01-15 ENCOUNTER — Other Ambulatory Visit: Payer: Self-pay | Admitting: Physician Assistant

## 2012-01-15 MED ORDER — ZOLPIDEM TARTRATE 10 MG PO TABS: 5.0000 mg | ORAL_TABLET | Freq: Every evening | ORAL | Status: DC | PRN

## 2012-01-28 ENCOUNTER — Other Ambulatory Visit: Payer: Self-pay | Admitting: Family Medicine

## 2012-01-28 NOTE — Telephone Encounter (Signed)
Patient's chart is at the nurses station in the pa pool pile.  [facility] [id number]

## 2012-01-28 NOTE — Telephone Encounter (Signed)
Please pull chart.

## 2012-02-03 ENCOUNTER — Telehealth: Payer: Self-pay | Admitting: Emergency Medicine

## 2012-02-03 DIAGNOSIS — G4733 Obstructive sleep apnea (adult) (pediatric): Secondary | ICD-10-CM

## 2012-02-03 DIAGNOSIS — J449 Chronic obstructive pulmonary disease, unspecified: Secondary | ICD-10-CM

## 2012-02-03 NOTE — Telephone Encounter (Signed)
Spoke with pt. He states that he is back in town from [place] and needs orders to start back on his o2 at hs. His sleep has not been well with just CPAP alone. He also wants to know if he can have order for portable o2 concentrator to take with him to [place] when he leaves in Oct. Wants orders sent to [facility]. He did use [facility] but they are no longer in his network so will need all new orders sent to [facility]. Please advise, thanks!

## 2012-02-08 NOTE — Telephone Encounter (Signed)
Order sent to [facility] to take care of his 02 and cpap Tobe Sos

## 2012-02-08 NOTE — Telephone Encounter (Signed)
Pt called [place] back wanting to know something.  Call him back @ [phone] Leanora Ivanoff

## 2012-02-08 NOTE — Telephone Encounter (Signed)
Please duplicate last O2 and CPAP orders through [facility] - make sure [facility] will be able to manage his needs both in [place] and in [place], as well as when he travels overseas. Thanks

## 2012-02-08 NOTE — Telephone Encounter (Signed)
I have sent order over to [facility]. Please advise thanks

## 2012-02-09 ENCOUNTER — Telehealth: Payer: Self-pay | Admitting: Emergency Medicine

## 2012-02-11 NOTE — Telephone Encounter (Signed)
I will sign off as an FYI and forward to [facility]. Carron Curie, CMA

## 2012-02-24 ENCOUNTER — Encounter: Payer: Medicare Other | Admitting: Physician Assistant

## 2012-02-25 ENCOUNTER — Ambulatory Visit (INDEPENDENT_AMBULATORY_CARE_PROVIDER_SITE_OTHER): Payer: Medicare Other | Admitting: Family Medicine

## 2012-02-25 VITALS — BP 114/62 | HR 83 | Temp 98.0°F | Resp 16 | Ht 66.5 in | Wt 204.0 lb

## 2012-02-25 DIAGNOSIS — G479 Sleep disorder, unspecified: Secondary | ICD-10-CM

## 2012-02-25 DIAGNOSIS — R109 Unspecified abdominal pain: Secondary | ICD-10-CM

## 2012-02-25 DIAGNOSIS — I4891 Unspecified atrial fibrillation: Secondary | ICD-10-CM

## 2012-02-25 DIAGNOSIS — M2550 Pain in unspecified joint: Secondary | ICD-10-CM

## 2012-02-25 DIAGNOSIS — M109 Gout, unspecified: Secondary | ICD-10-CM | POA: Diagnosis not present

## 2012-02-25 DIAGNOSIS — Z8719 Personal history of other diseases of the digestive system: Secondary | ICD-10-CM

## 2012-02-25 DIAGNOSIS — I480 Paroxysmal atrial fibrillation: Secondary | ICD-10-CM

## 2012-02-25 DIAGNOSIS — M25519 Pain in unspecified shoulder: Secondary | ICD-10-CM

## 2012-02-25 DIAGNOSIS — G4700 Insomnia, unspecified: Secondary | ICD-10-CM

## 2012-02-25 LAB — POCT URINALYSIS DIPSTICK
Bilirubin, UA: NEGATIVE
Blood, UA: NEGATIVE
Glucose, UA: NEGATIVE
Ketones, UA: NEGATIVE
Leukocytes, UA: NEGATIVE
Nitrite, UA: NEGATIVE
Protein, UA: NEGATIVE
Spec Grav, UA: 1.02
Urobilinogen, UA: 0.2
pH, UA: 5

## 2012-02-25 LAB — COMPREHENSIVE METABOLIC PANEL
ALT: 23 U/L (ref 0–53)
AST: 22 U/L (ref 0–37)
Albumin: 4.5 g/dL (ref 3.5–5.2)
Alkaline Phosphatase: 89 U/L (ref 39–117)
BUN: 21 mg/dL (ref 6–23)
CO2: 27 mEq/L (ref 19–32)
Calcium: 9.3 mg/dL (ref 8.4–10.5)
Chloride: 106 mEq/L (ref 96–112)
Creat: 1 mg/dL (ref 0.50–1.35)
Glucose, Bld: 97 mg/dL (ref 70–99)
Potassium: 3.9 mEq/L (ref 3.5–5.3)
Sodium: 143 mEq/L (ref 135–145)
Total Bilirubin: 0.5 mg/dL (ref 0.3–1.2)
Total Protein: 6.4 g/dL (ref 6.0–8.3)

## 2012-02-25 LAB — POCT UA - MICROSCOPIC ONLY
Casts, Ur, LPF, POC: NEGATIVE
Crystals, Ur, HPF, POC: NEGATIVE
Epithelial cells, urine per micros: NEGATIVE
Mucus, UA: NEGATIVE
RBC, urine, microscopic: NEGATIVE
Yeast, UA: NEGATIVE

## 2012-02-25 LAB — URIC ACID: Uric Acid, Serum: 5 mg/dL (ref 4.0–7.8)

## 2012-02-25 MED ORDER — TRAZODONE HCL 50 MG PO TABS: 25.0000 mg | ORAL_TABLET | Freq: Every evening | ORAL | Status: DC | PRN

## 2012-02-25 MED ORDER — TEMAZEPAM 15 MG PO CAPS: 15.0000 mg | ORAL_CAPSULE | Freq: Every evening | ORAL | Status: DC | PRN

## 2012-02-25 MED ORDER — LANSOPRAZOLE 30 MG PO CPDR: 30.0000 mg | DELAYED_RELEASE_CAPSULE | Freq: Every morning | ORAL | Status: DC

## 2012-02-25 MED ORDER — ZALEPLON 10 MG PO CAPS: 10.0000 mg | ORAL_CAPSULE | Freq: Every evening | ORAL | Status: DC | PRN

## 2012-02-25 MED ORDER — DILTIAZEM HCL ER COATED BEADS 180 MG PO CP24: 180.0000 mg | ORAL_CAPSULE | Freq: Every day | ORAL | Status: DC

## 2012-02-25 MED ORDER — ZOLPIDEM TARTRATE 10 MG PO TABS: 5.0000 mg | ORAL_TABLET | Freq: Every evening | ORAL | Status: DC | PRN

## 2012-02-25 MED ORDER — ALLOPURINOL 300 MG PO TABS: 300.0000 mg | ORAL_TABLET | Freq: Every day | ORAL | Status: DC

## 2012-02-25 NOTE — Patient Instructions (Addendum)
Send your records from Dr Cleophas Dunker to [facility] for review. Dr Rennis Chris or Dr Thomasena Edis are the shoulder doctors at [facility] ortho The phone # is [phone] and the fax [phone]  Try trazodone for sleep.  Ice toes today after injections 20 minutes at a time they will be sore for couple days then will improve Have fun on your upcoming trip!!! Increase Allopurinol to 300 mg dose, this will be one daily. Try to only take Colchicine once daily, if needed you can take two a day, but try to only take one daily. We will call you with the appt for Dr Zenovia Jordan We will call you with lab results Your medications were sent to pharmacy q-r (quick relief) for nasal bleeding. You can get this over the counter.

## 2012-02-25 NOTE — Progress Notes (Addendum)
Subjective:  A fib.  Resolved.  On bipap and oxygen at night.  On blood thinner.  Wonders why needs to be on the thenar. I told her to stick with what they put him on less cardiologist the does differently.  Shoulder pain. Was told that he had 3 destroyed ligaments could not be surgically repaired. He would like a second opinion. We will make a referral for him  Foot pain in large toes. he is not sure whether to see a podiatrist or a orthopedist. Would like a referral.   5th finger right hand. Has arthritis and wants to see a rheumatologist. I went ahead and made the referral for him. We did not refer him for his foot, so he can talk with the rheumatologist about that.  Back pain acutely past few days. He does have some tenderness in his back. Do not think this more just musculoskeletal. is kidneys,  Epistaxis. History of recurrences. I do not see a focus for the bleeding so advised him to use some OTC QR for nose bleeds   Going to [place] in a couple of weeks.   Refills needs his refills.   Sleep disorder getting worse.  Cannot sleep.  Flu shot   Objective: No acute distress.  Chest cta. Heart rrr. abd soft.  Left subscapular tenderness.  Tenderness of both first mtp joint.   Imp; COPD Sleep apnea Intermittent atrial fib Left shoulder pain/degenerated tendons Foot pain from gout HTN Epistaxis Sleep apnea Back pain Arthritis Insomnia   Plan He has been alternating sleeping pills for years before I ever cared for him .  Has now moved from [place].   Will try trazodone and see if he can sleep with it.   Try OTC QR for his nose bleeds. Return if worse.  Injected both large toes for his pain

## 2012-03-01 ENCOUNTER — Encounter: Payer: Self-pay | Admitting: Family Medicine

## 2012-03-14 DIAGNOSIS — S43429A Sprain of unspecified rotator cuff capsule, initial encounter: Secondary | ICD-10-CM | POA: Diagnosis not present

## 2012-03-14 DIAGNOSIS — M25519 Pain in unspecified shoulder: Secondary | ICD-10-CM | POA: Diagnosis not present

## 2012-03-14 DIAGNOSIS — M19079 Primary osteoarthritis, unspecified ankle and foot: Secondary | ICD-10-CM | POA: Diagnosis not present

## 2012-03-14 DIAGNOSIS — M25579 Pain in unspecified ankle and joints of unspecified foot: Secondary | ICD-10-CM | POA: Diagnosis not present

## 2012-04-09 ENCOUNTER — Telehealth: Payer: Self-pay

## 2012-04-09 NOTE — Telephone Encounter (Signed)
PATIENT STATES HE NEEDS A REFILL ON LYRICA 75MG  CAPSULE. PLEASE CALL THE PATIENT FIRST SO HE CAN EXPLAIN WHAT HAS HAPPENED. THERE HAS BEEN SOME CONFUSION.  BEST PHONE [phone] (CELL)   PHARMACY CHOICE IS [facility] [address].  MBC

## 2012-04-11 NOTE — Telephone Encounter (Signed)
States Dr Alwyn Ren gave him this, Lyrica 75 mg 1 in am, and 2 at night, he wants renewal to [facility] [address] Also states his eye drop Latanoprost eye drops need to be sent in for him, he has just moved back here from [place] and asks if we can renew until he can get in with Opthamologist.

## 2012-04-11 NOTE — Telephone Encounter (Signed)
Does not look like we have ever prescribed this.  Left message for him to call back.

## 2012-04-11 NOTE — Telephone Encounter (Signed)
Please pull paper chart for review.

## 2012-04-12 MED ORDER — PREGABALIN 75 MG PO CAPS: 150.0000 mg | ORAL_CAPSULE | Freq: Every evening | ORAL | Status: DC

## 2012-04-12 MED ORDER — LATANOPROST 0.005 % OP SOLN: 1.0000 [drp] | Freq: Every day | OPHTHALMIC | Status: DC

## 2012-04-12 NOTE — Telephone Encounter (Signed)
Pt's paper chart is at [employer] desk in phone message stack [phone]

## 2012-04-12 NOTE — Telephone Encounter (Signed)
I see that the patient has been on this before, but at his September appt with Dr. Alwyn Ren this was not discussed or renewed.  I have sent a 30 day supply of each of these medications, he needs an appointment for more to make sure that this therapy is still appropriate

## 2012-04-12 NOTE — Telephone Encounter (Signed)
Notified pt that we have sent 1 mos supply of both to pharmacy and then he needs OV for more. Pt agreed.

## 2012-04-13 NOTE — Progress Notes (Signed)
This encounter was created in error - please disregard.

## 2012-05-02 ENCOUNTER — Ambulatory Visit (INDEPENDENT_AMBULATORY_CARE_PROVIDER_SITE_OTHER): Payer: Medicare Other | Admitting: Family Medicine

## 2012-05-02 VITALS — BP 130/72 | HR 65 | Temp 97.8°F | Resp 16 | Ht 66.5 in | Wt 205.0 lb

## 2012-05-02 DIAGNOSIS — IMO0001 Reserved for inherently not codable concepts without codable children: Secondary | ICD-10-CM

## 2012-05-02 DIAGNOSIS — J069 Acute upper respiratory infection, unspecified: Secondary | ICD-10-CM

## 2012-05-02 DIAGNOSIS — L299 Pruritus, unspecified: Secondary | ICD-10-CM | POA: Diagnosis not present

## 2012-05-02 DIAGNOSIS — M549 Dorsalgia, unspecified: Secondary | ICD-10-CM

## 2012-05-02 DIAGNOSIS — H409 Unspecified glaucoma: Secondary | ICD-10-CM

## 2012-05-02 DIAGNOSIS — M797 Fibromyalgia: Secondary | ICD-10-CM

## 2012-05-02 MED ORDER — OXYCODONE-ACETAMINOPHEN 7.5-325 MG PO TABS: 1.0000 | ORAL_TABLET | Freq: Four times a day (QID) | ORAL | Status: DC | PRN

## 2012-05-02 MED ORDER — PREGABALIN 75 MG PO CAPS: 150.0000 mg | ORAL_CAPSULE | Freq: Every evening | ORAL | Status: DC

## 2012-05-02 MED ORDER — LATANOPROST 0.005 % OP SOLN: 1.0000 [drp] | Freq: Every day | OPHTHALMIC | Status: DC

## 2012-05-02 NOTE — Patient Instructions (Signed)
Over-the-counter hydrocortisone cream a tiny bit you're on an as-needed basis for itching. Consider trying different earplugs.  Take the pain medications only when needed for severe pain

## 2012-05-02 NOTE — Progress Notes (Signed)
Subjective: Patient is here for a couple of things. He needs a couple of his medicines rewritten usually given a temporary refill on. He also has been having problems with his ears itching in for some time now. He wears earplugs at night. He also has been having back pain.  Pain medicines for a long time.  Objective: Very limited range of motion of his spine. No CVA tenderness. His TMs are normal. Throat clear. Neck supple without nodes thyromegaly.  Assessment: Ears itching Back pain History of fibromyalgia History of glaucoma  Plan: Represcribed his medications. Percocet 7.5 every 6 when necessary severe pain #30  Try some OTC hydrocortisone cream in his ears MCL it does.

## 2012-05-23 DIAGNOSIS — M2550 Pain in unspecified joint: Secondary | ICD-10-CM | POA: Diagnosis not present

## 2012-05-23 DIAGNOSIS — M109 Gout, unspecified: Secondary | ICD-10-CM | POA: Diagnosis not present

## 2012-05-23 DIAGNOSIS — M545 Low back pain, unspecified: Secondary | ICD-10-CM | POA: Diagnosis not present

## 2012-05-23 DIAGNOSIS — IMO0001 Reserved for inherently not codable concepts without codable children: Secondary | ICD-10-CM | POA: Diagnosis not present

## 2012-05-23 DIAGNOSIS — M1990 Unspecified osteoarthritis, unspecified site: Secondary | ICD-10-CM | POA: Diagnosis not present

## 2012-06-04 ENCOUNTER — Ambulatory Visit (INDEPENDENT_AMBULATORY_CARE_PROVIDER_SITE_OTHER): Payer: Medicare Other | Admitting: Family Medicine

## 2012-06-04 VITALS — BP 156/76 | HR 88 | Temp 98.0°F | Resp 16 | Ht 67.7 in | Wt 202.2 lb

## 2012-06-04 DIAGNOSIS — I951 Orthostatic hypotension: Secondary | ICD-10-CM

## 2012-06-04 DIAGNOSIS — R42 Dizziness and giddiness: Secondary | ICD-10-CM

## 2012-06-04 DIAGNOSIS — S20219A Contusion of unspecified front wall of thorax, initial encounter: Secondary | ICD-10-CM | POA: Diagnosis not present

## 2012-06-04 LAB — POCT CBC
Granulocyte percent: 60.2 %G (ref 37–80)
HCT, POC: 47.9 % (ref 43.5–53.7)
Hemoglobin: 14.9 g/dL (ref 14.1–18.1)
Lymph, poc: 1.8 (ref 0.6–3.4)
MCH, POC: 30.7 pg (ref 27–31.2)
MCHC: 31.1 g/dL — AB (ref 31.8–35.4)
MCV: 98.6 fL — AB (ref 80–97)
MID (cbc): 0.6 (ref 0–0.9)
MPV: 10 fL (ref 0–99.8)
POC Granulocyte: 3.5 (ref 2–6.9)
POC LYMPH PERCENT: 30.3 %L (ref 10–50)
POC MID %: 9.5 %M (ref 0–12)
Platelet Count, POC: 226 10*3/uL (ref 142–424)
RBC: 4.86 M/uL (ref 4.69–6.13)
RDW, POC: 14.7 %
WBC: 5.8 10*3/uL (ref 4.6–10.2)

## 2012-06-04 LAB — GLUCOSE, POCT (MANUAL RESULT ENTRY): POC Glucose: 96 mg/dl (ref 70–99)

## 2012-06-04 NOTE — Progress Notes (Signed)
  Subjective:    Patient ID: Jonathan [name], male    DOB: [birth year], 78 y.o.   MRN: [id number]  HPI    Review of Systems     Objective:   Physical Exam        Assessment & Plan:

## 2012-06-04 NOTE — Progress Notes (Signed)
Results for orders placed in visit on 06/04/12  POCT CBC      Component Value Range   WBC 5.8  4.6 - 10.2 K/uL   Lymph, poc 1.8  0.6 - 3.4   POC LYMPH PERCENT 30.3  10 - 50 %L   MID (cbc) 0.6  0 - 0.9   POC MID % 9.5  0 - 12 %M   POC Granulocyte 3.5  2 - 6.9   Granulocyte percent 60.2  37 - 80 %G   RBC 4.86  4.69 - 6.13 M/uL   Hemoglobin 14.9  14.1 - 18.1 g/dL   HCT, POC 16.1  09.6 - 53.7 %   MCV 98.6 (*) 80 - 97 fL   MCH, POC 30.7  27 - 31.2 pg   MCHC 31.1 (*) 31.8 - 35.4 g/dL   RDW, POC 04.5     Platelet Count, POC 226  142 - 424 K/uL   MPV 10.0  0 - 99.8 fL  GLUCOSE, POCT (MANUAL RESULT ENTRY)      Component Value Range   POC Glucose 96  70 - 99 mg/dl

## 2012-06-04 NOTE — Patient Instructions (Signed)
Try decreasing the Lasix to one half tablet  Be careful when you stand up quickly to give yourself time for the blood pressure to equilibrate before you walk forward.  Drink more fluids, but did not have a lot of extra salt.  Try decreasing the Flexeril to one half tablet at bedtime.  Return sometime in mid January and bring all your medicines with you.  Avoid excessive use of the pain medications.

## 2012-06-05 LAB — COMPREHENSIVE METABOLIC PANEL
ALT: 23 U/L (ref 0–53)
AST: 22 U/L (ref 0–37)
Albumin: 4.7 g/dL (ref 3.5–5.2)
Alkaline Phosphatase: 86 U/L (ref 39–117)
BUN: 14 mg/dL (ref 6–23)
CO2: 29 mEq/L (ref 19–32)
Calcium: 9.6 mg/dL (ref 8.4–10.5)
Chloride: 102 mEq/L (ref 96–112)
Creat: 1.13 mg/dL (ref 0.50–1.35)
Glucose, Bld: 97 mg/dL (ref 70–99)
Potassium: 3.9 mEq/L (ref 3.5–5.3)
Sodium: 140 mEq/L (ref 135–145)
Total Bilirubin: 0.5 mg/dL (ref 0.3–1.2)
Total Protein: 6.4 g/dL (ref 6.0–8.3)

## 2012-06-06 ENCOUNTER — Encounter: Payer: Self-pay | Admitting: *Deleted

## 2012-06-20 ENCOUNTER — Encounter: Payer: Self-pay | Admitting: Emergency Medicine

## 2012-06-20 ENCOUNTER — Ambulatory Visit (INDEPENDENT_AMBULATORY_CARE_PROVIDER_SITE_OTHER): Payer: Medicare Other | Admitting: Emergency Medicine

## 2012-06-20 VITALS — BP 142/78 | HR 58 | Temp 98.3°F | Ht 66.0 in | Wt 202.6 lb

## 2012-06-20 DIAGNOSIS — G4733 Obstructive sleep apnea (adult) (pediatric): Secondary | ICD-10-CM | POA: Diagnosis not present

## 2012-06-20 DIAGNOSIS — J449 Chronic obstructive pulmonary disease, unspecified: Secondary | ICD-10-CM | POA: Diagnosis not present

## 2012-06-20 NOTE — Assessment & Plan Note (Signed)
Appears to be tolerating being off scheduled meds. Will continue albuterol prn.

## 2012-06-20 NOTE — Assessment & Plan Note (Signed)
Working to get another mask so he can restart nightly CPAP.

## 2012-06-20 NOTE — Patient Instructions (Addendum)
Please continue to work with your company to get your CPAP mask so you can restarted on nightly CPAP.  Continue to have albuterol available to use if needed for shortness of breath Follow with Dr Delton Coombes in 6 months or sooner if you have any problems

## 2012-06-20 NOTE — Progress Notes (Signed)
  Subjective:    Patient ID: Jonathan [name], male    DOB: 11/05/1932, 79 y.o.   MRN: [id number] HPI 79 yo man, former smoker, hx of A Fib, COPD on nocturnal O2, GERD, also OSA not on CPAP for over 4 yrs. He has had PFT done in [place] Fall 2012 that supported COPD, walking oximetry that showed ? Hypoxemia, ONO with desats and started on . Began to have AE +/- PNA in 1/13 that never really got better. He was admitted to [facility] 2/4-07/21/11 for an AE-COPD associated with A fib + RVR.  Was rx steroids, abx, BD's. He is better but is limited to some degree, not back to prior baseline. He is low on energy. He is having trouble sleeping, difficulty falling asleep and staying asleep. He snores, no witnessed apnea. No real wheezing or coughing at this time.   ROV 10/07/11 -- 78yom hx of A Fib, COPD on nocturnal O2, GERD, also OSA not on CPAP for over 51yrs. Returns after PSG 09/17/11, shows AHI 27/hour. Also obtained his PFT from [place] from 11/12 - FEV1 71% predicted with positive BD response. Still having trouble w exertion, limited by arthritis more than breathing.Tolerating Symbicort, not really sure it is helping him.    ROV 12/04/11 -- 78yom hx of A Fib, COPD on nocturnal O2, GERD, OSA. We recently started back on CPAP, his download data 5/18-6/15 shows he wears it longer than 4 hours for 58% of the nights. He is working on increasing compliance. All barriers discussed, should be able to overcome. He has new mask that is better. He now has O2 bled in and has been wearing it all night.  Last time we stopped Symbicort - he hasn't missed it. Hasn't used the SABA.   ROV 06/20/12 -- 79 yo man, hx COPD/+BD response, OSA. He is getting his CPAP thru [facility], his O2 thru [employer]. Uses albuterol very rarely. He still has exertional SOB. He is trying to get new CPAP mask, the company hasn't gotten it to him yet. He hasn't been using CPAP for about two weeks - he is planning to go back to it.      Objective:   Physical Exam Filed Vitals:   06/20/12 1404  BP: 142/78  Pulse: 58  Temp: 98.3 F (36.8 C)    Gen: Pleasant, well-nourished, in no distress,  normal affect  ENT: No lesions,  mouth clear,  oropharynx clear, no postnasal drip  Neck: No JVD, no TMG, no carotid bruits  Lungs: No use of accessory muscles, no dullness to percussion, clear without rales or rhonchi  Cardiovascular: RRR, heart sounds normal, no murmur or gallops, no peripheral edema  Musculoskeletal: No deformities, no cyanosis or clubbing  Neuro: alert, non focal  Skin: Warm, no lesions or rashes     Assessment & Plan:  Sleep apnea, obstructive Working to get another mask so he can restart nightly CPAP.   COPD (chronic obstructive pulmonary disease) Appears to be tolerating being off scheduled meds. Will continue albuterol prn.

## 2012-06-24 ENCOUNTER — Ambulatory Visit (INDEPENDENT_AMBULATORY_CARE_PROVIDER_SITE_OTHER): Payer: Medicare Other | Admitting: Cardiology

## 2012-06-24 ENCOUNTER — Encounter: Payer: Self-pay | Admitting: Cardiology

## 2012-06-24 VITALS — BP 142/68 | HR 58 | Ht 66.0 in | Wt 198.0 lb

## 2012-06-24 DIAGNOSIS — I4891 Unspecified atrial fibrillation: Secondary | ICD-10-CM

## 2012-06-24 DIAGNOSIS — I10 Essential (primary) hypertension: Secondary | ICD-10-CM | POA: Diagnosis not present

## 2012-06-24 NOTE — Assessment & Plan Note (Signed)
Blood pressure recently elevated. He is keeping records for Dr. Alwyn Ren. He is scheduled to see him in 2 weeks and further adjustments will be made at that time.

## 2012-06-24 NOTE — Patient Instructions (Addendum)
Your physician wants you to follow-up in: 6 MONTHS WITH DR CRENSHAW You will receive a reminder letter in the mail two months in advance. If you don't receive a letter, please call our office to schedule the follow-up appointment.  

## 2012-06-24 NOTE — Assessment & Plan Note (Signed)
Patient remains in sinus rhythm. Continue Cardizem and xeralto. Recent GFR greater than 50. Recent hemoglobin normal. He did have one hour of weakness yesterday and wonders whether he may have had atrial fibrillation. If recurrent episodes in the future we'll consider CardioNet. If his echo fibrillation becomes more frequent we will consider antiarrhythmic.

## 2012-06-24 NOTE — Progress Notes (Signed)
HPI: Pleasant gentleman previously admitted to [facility] with atrial fibrillation. He converted spontaneously to sinus rhythm. Note the patient had an echocardiogram in [place] in November of 2012 that showed an ejection fraction of 59% with no valvular abnormalities. Cardiac markers were negative. D-dimer and VQ scan negative. TSH normal. Patient noted to have renal insufficiency and therefore was started on xeralto 15 mg daily. Outpatient nuclear study performed in February of 2013 showed normal perfusion and an ejection fraction of 65%. Patient also with significant COPD and is now followed by pulmonary. Note BUN and creatinine on 06/04/2012 or 14 and 1.13 respectively. Hgb 14.9. Since I last saw him in June of 2013, there is no dyspnea on exertion, orthopnea, PND, pedal edema or syncope. He did fall approximately 2 weeks prior to Christmas and has had some residual pain in his chest. There was no syncope. He also states he had a period of weakness for approximately one hour yesterday and wonders whether he may have had atrial fibrillation.   Current Outpatient Prescriptions  Medication Sig Dispense Refill  . acetaminophen (TYLENOL) 500 MG tablet Take 1,000 mg by mouth 2 (two) times daily.      Marland Kitchen allopurinol (ZYLOPRIM) 300 MG tablet Take 1 tablet (300 mg total) by mouth daily.  90 tablet  3  . Calcium Lactate 750 MG TABS Take 1 tablet by mouth 2 (two) times daily.      . Cholecalciferol (VITAMIN D3 PO) Take 2,000 capsules by mouth every morning.      . colchicine 0.6 MG tablet Take 0.6 mg by mouth as needed.      . cyclobenzaprine (FLEXERIL) 10 MG tablet Take 10 mg by mouth at bedtime. 1/2 tablet weekly For muscle spasms      . diltiazem (CARDIZEM CD) 180 MG 24 hr capsule Take 1 capsule (180 mg total) by mouth daily.  90 capsule  3  . doxazosin (CARDURA) 8 MG tablet Take 8 mg by mouth at bedtime.      . fluticasone (FLONASE) 50 MCG/ACT nasal spray Place 2 sprays into the nose 2 (two)  times daily.      . furosemide (LASIX) 20 MG tablet Take 5 mg by mouth daily as needed.      . lansoprazole (PREVACID) 30 MG capsule Take 1 capsule (30 mg total) by mouth every morning.  90 capsule  3  . latanoprost (XALATAN) 0.005 % ophthalmic solution Place 1 drop into both eyes at bedtime.  7.5 mL  3  . levothyroxine (SYNTHROID, LEVOTHROID) 75 MCG tablet Take 75 mcg by mouth daily.      Marland Kitchen loratadine (CLARITIN) 10 MG tablet Take 1 tablet (10 mg total) by mouth daily.  30 tablet  11  . Melatonin 3 MG CAPS Take 2 capsules by mouth at bedtime.      . Misc Natural Products (GLUCOSAMINE CHOND COMPLEX/MSM) TABS Take 1,500 tablets by mouth 2 (two) times daily.      . Multiple Vitamin (MULTIVITAMIN) tablet Take 1 tablet by mouth daily.      . Omega-3 Fatty Acids (FISH OIL PO) Take 400 capsules by mouth every morning.      Marland Kitchen oxycodone (OXY-IR) 5 MG capsule Take 5 mg by mouth every 6 (six) hours as needed.      . pregabalin (LYRICA) 75 MG capsule Take 2 capsules (150 mg total) by mouth every evening. And take 1 capsule (75mg ) by mouth every morning.  270 capsule  3  .  Rivaroxaban 20 MG TABS Take 20 mg by mouth daily with supper.  90 tablet  4  . sertraline (ZOLOFT) 100 MG tablet Take 50-100 mg by mouth daily.       . tadalafil (CIALIS) 20 MG tablet Take 20 mg by mouth daily as needed. For erectile dysfunction      . temazepam (RESTORIL) 15 MG capsule Take 1 capsule (15 mg total) by mouth at bedtime as needed. For sleep  30 capsule  3  . traZODone (DESYREL) 50 MG tablet Take 25-50 mg by mouth at bedtime as needed.      . zaleplon (SONATA) 10 MG capsule Take 1 capsule (10 mg total) by mouth at bedtime as needed.  30 capsule  3  . zolpidem (AMBIEN) 10 MG tablet Take 0.5 tablets (5 mg total) by mouth at bedtime as needed. For sleep  30 tablet  3  . [DISCONTINUED] albuterol-ipratropium (COMBIVENT) 18-103 MCG/ACT inhaler Inhale 2 puffs into the lungs every 6 (six) hours as needed. For shortness of breath        . [DISCONTINUED] calcium-vitamin D (OSCAL WITH D) 500-200 MG-UNIT per tablet Take 1 tablet by mouth daily.      . [DISCONTINUED] diphenhydrAMINE (SOMINEX) 25 MG tablet Take 50 mg by mouth at bedtime as needed. For sleep      . [DISCONTINUED] mometasone (NASONEX) 50 MCG/ACT nasal spray Place 2 sprays into the nose daily.         Past Medical History  Diagnosis Date  . Bronchitis   . Hypertension   . Gout   . BPH (benign prostatic hyperplasia)   . Spinal compression fracture seventh vertebre  . COPD (chronic obstructive pulmonary disease)     2 liters O2 HS  . Hypoxia   . Fibromyalgia   . Peripheral neuropathy   . Osteoarthritis   . GERD (gastroesophageal reflux disease)   . Hypothyroidism   . Insomnia   . Rotator cuff tear, left   . Glaucoma(365)     bilateral eyes  . Fatty tumor     waste and back  . Atrial fibrillation   . Depression   . Cataract     Past Surgical History  Procedure Date  . Knee surgery   . Tonsil   . Appendectomy age 49  . Nasal concha bullosa resection age 33  . Vasectomy age 89  . Inguinal hernia repair age 69    rt side  . Cholecystectomy age 89  . Cataract extraction     bilateral  . Eye surgery   . Prostate surgery     History   Social History  . Marital Status: Single    Spouse Name: N/A    Number of Children: N/A  . Years of Education: N/A   Occupational History  . RETIRED    Social History Main Topics  . Smoking status: Former Smoker -- 2.0 packs/day for 10 years    Types: Cigarettes    Quit date: 07/18/1975  . Smokeless tobacco: Never Used  . Alcohol Use: Yes     Comment: 1-2 drinks/day  . Drug Use: No  . Sexually Active: Not on file   Other Topics Concern  . Not on file   Social History Narrative  . No narrative on file    ROS: no fevers or chills, productive cough, hemoptysis, dysphasia, odynophagia, melena, hematochezia, dysuria, hematuria, rash, seizure activity, orthopnea, PND, pedal edema, claudication.  Remaining systems are negative.  Physical Exam: Well-developed well-nourished in no  acute distress.  Skin is warm and dry.  HEENT is normal.  Neck is supple.  Chest is clear to auscultation with normal expansion.  Cardiovascular exam is regular rate and rhythm.  Abdominal exam nontender or distended. No masses palpated. Extremities show no edema. neuro grossly intact  ECG sinus bradycardia at a rate of 58. Prior septal infarct.

## 2012-07-04 ENCOUNTER — Ambulatory Visit (INDEPENDENT_AMBULATORY_CARE_PROVIDER_SITE_OTHER): Payer: Medicare Other | Admitting: Family Medicine

## 2012-07-04 VITALS — BP 133/75 | HR 65 | Temp 98.0°F | Resp 16 | Ht 66.0 in | Wt 199.6 lb

## 2012-07-04 DIAGNOSIS — G479 Sleep disorder, unspecified: Secondary | ICD-10-CM | POA: Diagnosis not present

## 2012-07-04 DIAGNOSIS — IMO0001 Reserved for inherently not codable concepts without codable children: Secondary | ICD-10-CM

## 2012-07-04 DIAGNOSIS — M797 Fibromyalgia: Secondary | ICD-10-CM

## 2012-07-04 DIAGNOSIS — G609 Hereditary and idiopathic neuropathy, unspecified: Secondary | ICD-10-CM

## 2012-07-04 DIAGNOSIS — G629 Polyneuropathy, unspecified: Secondary | ICD-10-CM

## 2012-07-04 MED ORDER — PREGABALIN 100 MG PO CAPS: ORAL_CAPSULE | ORAL | Status: DC

## 2012-07-04 NOTE — Progress Notes (Signed)
Subjective: 79 year old man who is here with more problems with leg pain. He has had the problem for a long time but they've been getting worse. Both legs hurt up to the thighs. He continues to have a lot of trouble with sleep disturbance and takes a large number of medications to help himself can't sleep. He continues to see the pulmonologist for his sleep apnea and a cardiologist for his heart. Is not getting any regular exercise. He's not had any other major new symptoms. He gets down, feeling like his age 9 years in the last year. No major chest pains or shortness of breath. Her blood pressure record with them. Usually run between 1:30 and 150/70-90  Objective: Somewhat overweight man in no major distress. His TMs normal. Throat clear. Neck supple without nodes thyromegaly. No carotid bruits. Chest clear. Heart regular without murmurs. Evidence of metastasis. Pedal pulses are good. No calf or thigh tenderness. His toes are good deal.  Assessment: Peripheral neuropathy Sleep disturbance Chronic depression Polypharmacy  Plan: He brought in all his medications with him. I again reviewed the box of medicines puncture-like coughs some but I'm not sure exactly how. Suggested he might try Pepcid and getting away from the Protonix. Use it just on a when necessary basis.  We'll change the Lyrica to 103 times a day, actually one in the morning and 2 in the evening works best for him.  Return in about 3 months, sooner if problems.

## 2012-07-04 NOTE — Patient Instructions (Addendum)
Increase Lyrica to 75, 75, 150 daily.  I will change you to the 100 mg pills one in AM and 2 at bedtime.    Return in 2-3 months  Increase exercise  Try removing the lansoprazole and just taking something like Pepcid on an as-needed basis for your stomach.

## 2012-07-12 DIAGNOSIS — M47817 Spondylosis without myelopathy or radiculopathy, lumbosacral region: Secondary | ICD-10-CM | POA: Diagnosis not present

## 2012-07-12 DIAGNOSIS — G894 Chronic pain syndrome: Secondary | ICD-10-CM | POA: Diagnosis not present

## 2012-07-12 DIAGNOSIS — M79609 Pain in unspecified limb: Secondary | ICD-10-CM | POA: Diagnosis not present

## 2012-07-12 DIAGNOSIS — Z79899 Other long term (current) drug therapy: Secondary | ICD-10-CM | POA: Diagnosis not present

## 2012-07-13 ENCOUNTER — Other Ambulatory Visit: Payer: Self-pay | Admitting: Pain Medicine

## 2012-07-13 DIAGNOSIS — M545 Low back pain, unspecified: Secondary | ICD-10-CM

## 2012-07-20 ENCOUNTER — Ambulatory Visit
Admission: RE | Admit: 2012-07-20 | Discharge: 2012-07-20 | Disposition: A | Discharge status: Home / Self Care | Payer: Medicare Other | Source: Ambulatory Visit | Attending: Pain Medicine | Admitting: Pain Medicine

## 2012-07-20 DIAGNOSIS — M545 Low back pain, unspecified: Secondary | ICD-10-CM

## 2012-07-20 DIAGNOSIS — M47817 Spondylosis without myelopathy or radiculopathy, lumbosacral region: Secondary | ICD-10-CM | POA: Diagnosis not present

## 2012-07-20 DIAGNOSIS — M48061 Spinal stenosis, lumbar region without neurogenic claudication: Secondary | ICD-10-CM | POA: Diagnosis not present

## 2012-07-26 ENCOUNTER — Ambulatory Visit (INDEPENDENT_AMBULATORY_CARE_PROVIDER_SITE_OTHER): Payer: Medicare Other | Admitting: Family Medicine

## 2012-07-26 ENCOUNTER — Ambulatory Visit: Payer: Medicare Other

## 2012-07-26 VITALS — BP 167/68 | HR 63 | Temp 98.4°F | Resp 17 | Ht 67.5 in | Wt 198.0 lb

## 2012-07-26 DIAGNOSIS — M545 Low back pain, unspecified: Secondary | ICD-10-CM

## 2012-07-26 DIAGNOSIS — K5900 Constipation, unspecified: Secondary | ICD-10-CM | POA: Diagnosis not present

## 2012-07-26 LAB — POCT URINALYSIS DIPSTICK
Bilirubin, UA: NEGATIVE
Blood, UA: NEGATIVE
Glucose, UA: NEGATIVE
Ketones, UA: NEGATIVE
Leukocytes, UA: NEGATIVE
Nitrite, UA: NEGATIVE
Spec Grav, UA: 1.02
Urobilinogen, UA: 0.2
pH, UA: 6.5

## 2012-07-26 LAB — POCT UA - MICROSCOPIC ONLY
Bacteria, U Microscopic: NEGATIVE
Casts, Ur, LPF, POC: NEGATIVE
Crystals, Ur, HPF, POC: NEGATIVE
Epithelial cells, urine per micros: NEGATIVE
Mucus, UA: NEGATIVE
RBC, urine, microscopic: NEGATIVE
WBC, Ur, HPF, POC: NEGATIVE
Yeast, UA: NEGATIVE

## 2012-07-26 NOTE — Patient Instructions (Signed)
Take Miralax daily (twice daily if needed).  When bowels get a little loose begin cutting back to half a dose or as needed.  Constipation, Adult Constipation is when a person has fewer than 3 bowel movements a week; has difficulty having a bowel movement; or has stools that are dry, hard, or larger than normal. As people grow older, constipation is more common. If you try to fix constipation with medicines that make you have a bowel movement (laxatives), the problem may get worse. Long-term laxative use may cause the muscles of the colon to become weak. A low-fiber diet, not taking in enough fluids, and taking certain medicines may make constipation worse. CAUSES   Certain medicines, such as antidepressants, pain medicine, iron supplements, antacids, and water pills.   Certain diseases, such as diabetes, irritable bowel syndrome (IBS), thyroid disease, or depression.   Not drinking enough water.   Not eating enough fiber-rich foods.   Stress or travel.  Lack of physical activity or exercise.  Not going to the restroom when there is the urge to have a bowel movement.  Ignoring the urge to have a bowel movement.  Using laxatives too much. SYMPTOMS   Having fewer than 3 bowel movements a week.   Straining to have a bowel movement.   Having hard, dry, or larger than normal stools.   Feeling full or bloated.   Pain in the lower abdomen.  Not feeling relief after having a bowel movement. DIAGNOSIS  Your caregiver will take a medical history and perform a physical exam. Further testing may be done for severe constipation. Some tests may include:   A barium enema X-ray to examine your rectum, colon, and sometimes, your small intestine.  A sigmoidoscopy to examine your lower colon.  A colonoscopy to examine your entire colon. TREATMENT  Treatment will depend on the severity of your constipation and what is causing it. Some dietary treatments include drinking more fluids  and eating more fiber-rich foods. Lifestyle treatments may include regular exercise. If these diet and lifestyle recommendations do not help, your caregiver may recommend taking over-the-counter laxative medicines to help you have bowel movements. Prescription medicines may be prescribed if over-the-counter medicines do not work.  HOME CARE INSTRUCTIONS   Increase dietary fiber in your diet, such as fruits, vegetables, whole grains, and beans. Limit high-fat and processed sugars in your diet, such as [place] fries, hamburgers, cookies, candies, and soda.   A fiber supplement may be added to your diet if you cannot get enough fiber from foods.   Drink enough fluids to keep your urine clear or pale yellow.   Exercise regularly or as directed by your caregiver.   Go to the restroom when you have the urge to go. Do not hold it.  Only take medicines as directed by your caregiver. Do not take other medicines for constipation without talking to your caregiver first. SEEK IMMEDIATE MEDICAL CARE IF:   You have bright red blood in your stool.   Your constipation lasts for more than 4 days or gets worse.   You have abdominal or rectal pain.   You have thin, pencil-like stools.  You have unexplained weight loss. MAKE SURE YOU:   Understand these instructions.  Will watch your condition.  Will get help right away if you are not doing well or get worse. Document Released: 02/28/2004 Document Revised: 08/24/2011 Document Reviewed: 05/05/2011 [facility] Patient Information 2013 [place], [place].

## 2012-07-26 NOTE — Progress Notes (Signed)
  Subjective: Patient has been having problems with pain in his right flank. He has a history of passing 2 kidney stones in the past. He has not seen any blood in his urine. This involving for several days. Knows of no injury.  Objective: Overweight male in no major distress. Abdomen soft nontender. No CVA tenderness. Results for orders placed in visit on 07/26/12  POCT URINALYSIS DIPSTICK      Result Value Range   Color, UA yellow     Clarity, UA clear     Glucose, UA neg     Bilirubin, UA neg     Ketones, UA neg     Spec Grav, UA 1.020     Blood, UA neg     pH, UA 6.5     Protein, UA trace     Urobilinogen, UA 0.2     Nitrite, UA neg     Leukocytes, UA Negative    POCT UA - MICROSCOPIC ONLY      Result Value Range   WBC, Ur, HPF, POC neg     RBC, urine, microscopic neg     Bacteria, U Microscopic neg     Mucus, UA neg     Epithelial cells, urine per micros neg     Crystals, Ur, HPF, POC neg     Casts, Ur, LPF, POC neg     Yeast, UA neg     [facility] reading (PRIMARY) by  Dr. Alwyn Ren  No evidence of kidney stones. Lots of stool, possible constipation  Assessment: Flank pain, possibly secondary to constipation Constipation, from his chronic pain medications  Plan: MiraLax. Return for

## 2012-08-03 DIAGNOSIS — M545 Low back pain, unspecified: Secondary | ICD-10-CM | POA: Diagnosis not present

## 2012-08-03 DIAGNOSIS — M2550 Pain in unspecified joint: Secondary | ICD-10-CM | POA: Diagnosis not present

## 2012-08-03 DIAGNOSIS — M1990 Unspecified osteoarthritis, unspecified site: Secondary | ICD-10-CM | POA: Diagnosis not present

## 2012-08-03 DIAGNOSIS — IMO0001 Reserved for inherently not codable concepts without codable children: Secondary | ICD-10-CM | POA: Diagnosis not present

## 2012-08-03 DIAGNOSIS — M109 Gout, unspecified: Secondary | ICD-10-CM | POA: Diagnosis not present

## 2012-08-04 DIAGNOSIS — IMO0002 Reserved for concepts with insufficient information to code with codable children: Secondary | ICD-10-CM | POA: Diagnosis not present

## 2012-08-04 DIAGNOSIS — R209 Unspecified disturbances of skin sensation: Secondary | ICD-10-CM | POA: Diagnosis not present

## 2012-08-09 DIAGNOSIS — M5380 Other specified dorsopathies, site unspecified: Secondary | ICD-10-CM | POA: Diagnosis not present

## 2012-08-09 DIAGNOSIS — G894 Chronic pain syndrome: Secondary | ICD-10-CM | POA: Diagnosis not present

## 2012-08-09 DIAGNOSIS — M47817 Spondylosis without myelopathy or radiculopathy, lumbosacral region: Secondary | ICD-10-CM | POA: Diagnosis not present

## 2012-08-11 DIAGNOSIS — M47817 Spondylosis without myelopathy or radiculopathy, lumbosacral region: Secondary | ICD-10-CM | POA: Diagnosis not present

## 2012-08-16 DIAGNOSIS — M47817 Spondylosis without myelopathy or radiculopathy, lumbosacral region: Secondary | ICD-10-CM | POA: Diagnosis not present

## 2012-08-17 DIAGNOSIS — M2020 Hallux rigidus, unspecified foot: Secondary | ICD-10-CM | POA: Diagnosis not present

## 2012-08-23 DIAGNOSIS — M47817 Spondylosis without myelopathy or radiculopathy, lumbosacral region: Secondary | ICD-10-CM | POA: Diagnosis not present

## 2012-09-06 DIAGNOSIS — M2020 Hallux rigidus, unspecified foot: Secondary | ICD-10-CM | POA: Diagnosis not present

## 2012-09-08 ENCOUNTER — Other Ambulatory Visit: Payer: Self-pay | Admitting: Family Medicine

## 2012-09-20 ENCOUNTER — Other Ambulatory Visit: Payer: Self-pay | Admitting: Pain Medicine

## 2012-09-20 DIAGNOSIS — M5380 Other specified dorsopathies, site unspecified: Secondary | ICD-10-CM | POA: Diagnosis not present

## 2012-09-20 DIAGNOSIS — M47817 Spondylosis without myelopathy or radiculopathy, lumbosacral region: Secondary | ICD-10-CM | POA: Diagnosis not present

## 2012-09-20 DIAGNOSIS — M546 Pain in thoracic spine: Secondary | ICD-10-CM

## 2012-09-20 DIAGNOSIS — G894 Chronic pain syndrome: Secondary | ICD-10-CM | POA: Diagnosis not present

## 2012-09-25 ENCOUNTER — Ambulatory Visit
Admission: RE | Admit: 2012-09-25 | Discharge: 2012-09-25 | Disposition: A | Discharge status: Home / Self Care | Payer: Medicare Other | Source: Ambulatory Visit | Attending: Pain Medicine | Admitting: Pain Medicine

## 2012-09-25 DIAGNOSIS — IMO0002 Reserved for concepts with insufficient information to code with codable children: Secondary | ICD-10-CM | POA: Diagnosis not present

## 2012-09-25 DIAGNOSIS — M47814 Spondylosis without myelopathy or radiculopathy, thoracic region: Secondary | ICD-10-CM | POA: Diagnosis not present

## 2012-09-25 DIAGNOSIS — M546 Pain in thoracic spine: Secondary | ICD-10-CM

## 2012-09-29 DIAGNOSIS — M47814 Spondylosis without myelopathy or radiculopathy, thoracic region: Secondary | ICD-10-CM | POA: Diagnosis not present

## 2012-09-29 DIAGNOSIS — M5124 Other intervertebral disc displacement, thoracic region: Secondary | ICD-10-CM | POA: Diagnosis not present

## 2012-09-29 DIAGNOSIS — G894 Chronic pain syndrome: Secondary | ICD-10-CM | POA: Diagnosis not present

## 2012-09-29 DIAGNOSIS — M546 Pain in thoracic spine: Secondary | ICD-10-CM | POA: Diagnosis not present

## 2012-10-04 DIAGNOSIS — IMO0002 Reserved for concepts with insufficient information to code with codable children: Secondary | ICD-10-CM | POA: Diagnosis not present

## 2012-10-28 ENCOUNTER — Telehealth: Payer: Self-pay

## 2012-10-28 NOTE — Telephone Encounter (Signed)
Patient needs an emergency refill for medication Xaralto. He needs it sent to [facility] located in [address] [place], [place] Phone: [phone]. Thanks  ° °

## 2012-10-28 NOTE — Telephone Encounter (Signed)
Patient needs an emergency refill for medication Xaralto. [facility] needs [facility] to [facility] located in [address] [place], [place] Phone: [phone]. Thanks

## 2012-10-30 NOTE — Telephone Encounter (Signed)
Pt will call cardiologist for refills. We never filled med here.

## 2012-11-10 DIAGNOSIS — M79609 Pain in unspecified limb: Secondary | ICD-10-CM | POA: Diagnosis not present

## 2012-11-10 DIAGNOSIS — M5137 Other intervertebral disc degeneration, lumbosacral region: Secondary | ICD-10-CM | POA: Diagnosis not present

## 2012-11-10 DIAGNOSIS — M47817 Spondylosis without myelopathy or radiculopathy, lumbosacral region: Secondary | ICD-10-CM | POA: Diagnosis not present

## 2012-11-10 DIAGNOSIS — M2550 Pain in unspecified joint: Secondary | ICD-10-CM | POA: Diagnosis not present

## 2012-11-10 DIAGNOSIS — M47814 Spondylosis without myelopathy or radiculopathy, thoracic region: Secondary | ICD-10-CM | POA: Diagnosis not present

## 2012-11-10 DIAGNOSIS — Z79899 Other long term (current) drug therapy: Secondary | ICD-10-CM | POA: Diagnosis not present

## 2012-11-10 DIAGNOSIS — G894 Chronic pain syndrome: Secondary | ICD-10-CM | POA: Diagnosis not present

## 2012-11-23 ENCOUNTER — Ambulatory Visit (INDEPENDENT_AMBULATORY_CARE_PROVIDER_SITE_OTHER): Payer: Medicare Other | Admitting: Family Medicine

## 2012-11-23 VITALS — BP 132/68 | HR 73 | Temp 97.8°F | Resp 16 | Ht 66.5 in | Wt 197.2 lb

## 2012-11-23 DIAGNOSIS — I10 Essential (primary) hypertension: Secondary | ICD-10-CM

## 2012-11-23 DIAGNOSIS — G894 Chronic pain syndrome: Secondary | ICD-10-CM

## 2012-11-23 DIAGNOSIS — M2550 Pain in unspecified joint: Secondary | ICD-10-CM

## 2012-11-23 DIAGNOSIS — E669 Obesity, unspecified: Secondary | ICD-10-CM | POA: Diagnosis not present

## 2012-11-23 DIAGNOSIS — F329 Major depressive disorder, single episode, unspecified: Secondary | ICD-10-CM

## 2012-11-23 DIAGNOSIS — E559 Vitamin D deficiency, unspecified: Secondary | ICD-10-CM

## 2012-11-23 DIAGNOSIS — F32A Depression, unspecified: Secondary | ICD-10-CM

## 2012-11-23 DIAGNOSIS — G4710 Hypersomnia, unspecified: Secondary | ICD-10-CM | POA: Diagnosis not present

## 2012-11-23 DIAGNOSIS — M109 Gout, unspecified: Secondary | ICD-10-CM

## 2012-11-23 DIAGNOSIS — R5381 Other malaise: Secondary | ICD-10-CM

## 2012-11-23 DIAGNOSIS — I4891 Unspecified atrial fibrillation: Secondary | ICD-10-CM | POA: Diagnosis not present

## 2012-11-23 DIAGNOSIS — E039 Hypothyroidism, unspecified: Secondary | ICD-10-CM

## 2012-11-23 DIAGNOSIS — R5383 Other fatigue: Secondary | ICD-10-CM

## 2012-11-23 DIAGNOSIS — G4700 Insomnia, unspecified: Secondary | ICD-10-CM

## 2012-11-23 DIAGNOSIS — G4730 Sleep apnea, unspecified: Secondary | ICD-10-CM

## 2012-11-23 DIAGNOSIS — M797 Fibromyalgia: Secondary | ICD-10-CM

## 2012-11-23 DIAGNOSIS — G479 Sleep disorder, unspecified: Secondary | ICD-10-CM

## 2012-11-23 DIAGNOSIS — G629 Polyneuropathy, unspecified: Secondary | ICD-10-CM

## 2012-11-23 LAB — LIPID PANEL
Cholesterol: 212 mg/dL — ABNORMAL HIGH (ref 0–200)
HDL: 50 mg/dL (ref >39)
LDL Cholesterol: 133 mg/dL — ABNORMAL HIGH (ref 0–99)
Total CHOL/HDL Ratio: 4.2 Ratio
Triglycerides: 146 mg/dL (ref <150)
VLDL: 29 mg/dL (ref 0–40)

## 2012-11-23 LAB — POCT UA - MICROSCOPIC ONLY
Casts, Ur, LPF, POC: NEGATIVE
Crystals, Ur, HPF, POC: NEGATIVE
RBC, urine, microscopic: NEGATIVE
Yeast, UA: NEGATIVE

## 2012-11-23 LAB — POCT CBC
Granulocyte percent: 48.1 %G (ref 37–80)
HCT, POC: 50.2 % (ref 43.5–53.7)
Hemoglobin: 15.6 g/dL (ref 14.1–18.1)
Lymph, poc: 2.1 (ref 0.6–3.4)
MCH, POC: 31.2 pg (ref 27–31.2)
MCHC: 31.1 g/dL — AB (ref 31.8–35.4)
MCV: 100.4 fL — AB (ref 80–97)
MID (cbc): 0.6 (ref 0–0.9)
MPV: 10.2 fL (ref 0–99.8)
POC Granulocyte: 2.5 (ref 2–6.9)
POC LYMPH PERCENT: 40.3 %L (ref 10–50)
POC MID %: 11.6 %M (ref 0–12)
Platelet Count, POC: 185 10*3/uL (ref 142–424)
RBC: 5 M/uL (ref 4.69–6.13)
RDW, POC: 14.9 %
WBC: 5.1 10*3/uL (ref 4.6–10.2)

## 2012-11-23 LAB — COMPREHENSIVE METABOLIC PANEL
ALT: 20 U/L (ref 0–53)
AST: 20 U/L (ref 0–37)
Albumin: 4.9 g/dL (ref 3.5–5.2)
Alkaline Phosphatase: 78 U/L (ref 39–117)
BUN: 19 mg/dL (ref 6–23)
CO2: 28 mEq/L (ref 19–32)
Calcium: 9.9 mg/dL (ref 8.4–10.5)
Chloride: 104 mEq/L (ref 96–112)
Creat: 1.26 mg/dL (ref 0.50–1.35)
Glucose, Bld: 102 mg/dL — ABNORMAL HIGH (ref 70–99)
Potassium: 4.1 mEq/L (ref 3.5–5.3)
Sodium: 142 mEq/L (ref 135–145)
Total Bilirubin: 0.4 mg/dL (ref 0.3–1.2)
Total Protein: 6.5 g/dL (ref 6.0–8.3)

## 2012-11-23 LAB — POCT URINALYSIS DIPSTICK
Bilirubin, UA: NEGATIVE
Blood, UA: NEGATIVE
Glucose, UA: NEGATIVE
Ketones, UA: NEGATIVE
Leukocytes, UA: NEGATIVE
Nitrite, UA: NEGATIVE
Protein, UA: NEGATIVE
Spec Grav, UA: 1.025
Urobilinogen, UA: 0.2
pH, UA: 5.5

## 2012-11-23 LAB — TSH: TSH: 4.415 u[IU]/mL (ref 0.350–4.500)

## 2012-11-23 LAB — URIC ACID: Uric Acid, Serum: 4.8 mg/dL (ref 4.0–7.8)

## 2012-11-23 MED ORDER — RIVAROXABAN 20 MG PO TABS: 20.0000 mg | ORAL_TABLET | Freq: Every day | ORAL | Status: DC

## 2012-11-23 MED ORDER — LEVOTHYROXINE SODIUM 75 MCG PO TABS: 75.0000 ug | ORAL_TABLET | Freq: Every day | ORAL | Status: DC

## 2012-11-23 MED ORDER — ACETAMINOPHEN 500 MG PO TABS: 1000.0000 mg | ORAL_TABLET | Freq: Two times a day (BID) | ORAL | Status: DC

## 2012-11-23 MED ORDER — SERTRALINE HCL 100 MG PO TABS: 50.0000 mg | ORAL_TABLET | Freq: Every day | ORAL | Status: DC

## 2012-11-23 MED ORDER — COLCHICINE 0.6 MG PO TABS: 0.6000 mg | ORAL_TABLET | ORAL | Status: DC | PRN

## 2012-11-23 MED ORDER — PREGABALIN 100 MG PO CAPS: ORAL_CAPSULE | ORAL | Status: DC

## 2012-11-23 MED ORDER — ALLOPURINOL 300 MG PO TABS: 300.0000 mg | ORAL_TABLET | Freq: Every day | ORAL | Status: DC

## 2012-11-23 MED ORDER — DOXAZOSIN MESYLATE 8 MG PO TABS: 8.0000 mg | ORAL_TABLET | Freq: Every day | ORAL | Status: DC

## 2012-11-23 MED ORDER — ZALEPLON 10 MG PO CAPS: 10.0000 mg | ORAL_CAPSULE | Freq: Every evening | ORAL | Status: DC | PRN

## 2012-11-23 NOTE — Patient Instructions (Addendum)
Add colace if needed for stool softener  Decrease flexeril to 1/2 daily   Change diltiazem to bedtime  Check to see if CPAP is properly regulated  Consider drug holiday from [place]  Exercise   I do not see anything immediately that would cause any difficulties 40 having surgery despite your fatigue.

## 2012-11-23 NOTE — Progress Notes (Signed)
Subjective: 79 year old man who is here for regular visit. He feels excessively fatigued. He goes back to bed to sleep every day. He may well or get up a couple of hours and then he goes back to bed. He has started doing more exercise. He is still a long list of medications. He is on oxycodone for his chronic pain. Ofloxacin nighttime. Medicine for atrial fibrillation. He is on gout medication. On blood pressure medication. He brought in his list of meds and reviewed them all looking for patterns for his fatigue. He is planning on having foot surgery later this summer diffuses large toes which hurts all time. He wanted medical clearance on that. He has some shooting headaches that last only a few seconds. He does have some rubs spot and it goes away. He has no chest pain or palpitations. No major shortness of breath. He's not sure that his CPAP is adjusted properly. He wonders whether poly-arthritic gout could be some of his aches and pains.  Patient has had cardiology evaluation earlier this year and he remained stable  We're supposed to have a form for note from the referring surgical physician, which cannot find.  Objective: Pleasant overweight gentleman in no major distress at this time. TMs normal. Throat clear. Neck supple without nodes thyromegaly. No carotid bruits. Chest clear to auscultation. Heart sounds regular right now but has a history of atrial fibrillation. And has normal bowel sounds, soft without mass or tenderness. Extremities without edema.  Assessment: Fatigue History of gout Polyarthralgias Hypertension Polypharmacy Hypersomnolence Depression  Plan: Check labs  Results for orders placed in visit on 11/23/12  POCT CBC      Result Value Range   WBC 5.1  4.6 - 10.2 K/uL   Lymph, poc 2.1  0.6 - 3.4   POC LYMPH PERCENT 40.3  10 - 50 %L   MID (cbc) 0.6  0 - 0.9   POC MID % 11.6  0 - 12 %M   POC Granulocyte 2.5  2 - 6.9   Granulocyte percent 48.1  37 - 80 %G   RBC 5.00   4.69 - 6.13 M/uL   Hemoglobin 15.6  14.1 - 18.1 g/dL   HCT, POC 16.1  09.6 - 53.7 %   MCV 100.4 (*) 80 - 97 fL   MCH, POC 31.2  27 - 31.2 pg   MCHC 31.1 (*) 31.8 - 35.4 g/dL   RDW, POC 04.5     Platelet Count, POC 185  142 - 424 K/uL   MPV 10.2  0 - 99.8 fL  POCT URINALYSIS DIPSTICK      Result Value Range   Color, UA yellow     Clarity, UA clear     Glucose, UA neg     Bilirubin, UA neg     Ketones, UA neg     Spec Grav, UA 1.025     Blood, UA neg     pH, UA 5.5     Protein, UA neg     Urobilinogen, UA 0.2     Nitrite, UA neg     Leukocytes, UA Negative    POCT UA - MICROSCOPIC ONLY      Result Value Range   WBC, Ur, HPF, POC 0-1     RBC, urine, microscopic neg     Bacteria, U Microscopic trace     Mucus, UA trace     Epithelial cells, urine per micros 1-2     Crystals, Ur, HPF, POC  neg     Casts, Ur, LPF, POC neg     Yeast, UA neg     I do not have the report form from the orthopedist for a preoperative medical clearance. However feel like he is fine to have his toe operated on. His cardio evaluation was good earlier this year. Labs are pending.  We spent a very long time reviewing his medications and trying to straighten things out again everything ordered. Approximately one hour or more were spent on his care.  Will dictate a letter of clearance.

## 2012-11-24 LAB — VITAMIN D 25 HYDROXY (VIT D DEFICIENCY, FRACTURES): Vit D, 25-Hydroxy: 41 ng/mL (ref 30–89)

## 2012-11-28 ENCOUNTER — Other Ambulatory Visit: Payer: Self-pay | Admitting: *Deleted

## 2012-11-28 MED ORDER — RIVAROXABAN 20 MG PO TABS: 20.0000 mg | ORAL_TABLET | Freq: Every day | ORAL | Status: DC

## 2012-12-25 ENCOUNTER — Other Ambulatory Visit: Payer: Self-pay | Admitting: Physician Assistant

## 2012-12-26 ENCOUNTER — Other Ambulatory Visit: Payer: Self-pay

## 2012-12-26 DIAGNOSIS — E039 Hypothyroidism, unspecified: Secondary | ICD-10-CM

## 2012-12-26 MED ORDER — LEVOTHYROXINE SODIUM 75 MCG PO TABS: 75.0000 ug | ORAL_TABLET | Freq: Every day | ORAL | Status: DC

## 2012-12-26 NOTE — Telephone Encounter (Signed)
Please advise on Thyroid labs. Patient taking meds. I spoke to him, he was not out of meds when labs were done. Please advise.  [phone]

## 2012-12-26 NOTE — Telephone Encounter (Signed)
Thyroid is okay but borderline.  Continue meds and repeat in 4-6 months

## 2013-01-26 ENCOUNTER — Encounter (HOSPITAL_BASED_OUTPATIENT_CLINIC_OR_DEPARTMENT_OTHER): Payer: Self-pay | Admitting: *Deleted

## 2013-01-26 NOTE — Progress Notes (Signed)
Reviewed case with dr Geanie Logan be best to stop xaralto-will ck with dr hewitt-pt to caal today-would need to stay if no block-pt has chronic pain and copd-states he is not using any inhaler daily

## 2013-01-26 NOTE — Progress Notes (Signed)
Pt sees dr Jens Som for hx af-htn-on xaralto-not told to stop it-to call dr hewitt to see if needs to stop-pt also uses cpap with oxygen at night-told to bring all meds and overnight bag and cpap- To come in for bmet

## 2013-01-27 ENCOUNTER — Telehealth: Payer: Self-pay | Admitting: Cardiology

## 2013-01-27 NOTE — Telephone Encounter (Signed)
Spoke with Jonathan [name], he is having fusion of the bones in his big toe Thursday next week by dr Victorino Dike and he wants to know how long to hold the xarelto. Will forward for dr Jens Som review

## 2013-01-27 NOTE — Telephone Encounter (Signed)
New problem   Pt having surgery and wants to know how soon he needs to stop xarelto

## 2013-01-27 NOTE — Telephone Encounter (Signed)
Do you know if this has been completed

## 2013-01-27 NOTE — Telephone Encounter (Signed)
Dc xeralto 2 days prior to procedure and resume day after Olga Millers

## 2013-01-30 NOTE — Telephone Encounter (Signed)
Left message for pt to call.

## 2013-01-31 ENCOUNTER — Encounter (HOSPITAL_BASED_OUTPATIENT_CLINIC_OR_DEPARTMENT_OTHER)
Admission: RE | Admit: 2013-01-31 | Discharge: 2013-01-31 | Disposition: A | Discharge status: Home / Self Care | Payer: Medicare Other | Source: Ambulatory Visit | Attending: Orthopedic Surgery | Admitting: Orthopedic Surgery

## 2013-01-31 DIAGNOSIS — E669 Obesity, unspecified: Secondary | ICD-10-CM | POA: Diagnosis not present

## 2013-01-31 DIAGNOSIS — G609 Hereditary and idiopathic neuropathy, unspecified: Secondary | ICD-10-CM | POA: Diagnosis not present

## 2013-01-31 DIAGNOSIS — Z8701 Personal history of pneumonia (recurrent): Secondary | ICD-10-CM | POA: Diagnosis not present

## 2013-01-31 DIAGNOSIS — K219 Gastro-esophageal reflux disease without esophagitis: Secondary | ICD-10-CM | POA: Diagnosis not present

## 2013-01-31 DIAGNOSIS — F329 Major depressive disorder, single episode, unspecified: Secondary | ICD-10-CM | POA: Diagnosis not present

## 2013-01-31 DIAGNOSIS — M1990 Unspecified osteoarthritis, unspecified site: Secondary | ICD-10-CM | POA: Diagnosis not present

## 2013-01-31 DIAGNOSIS — G4730 Sleep apnea, unspecified: Secondary | ICD-10-CM | POA: Diagnosis not present

## 2013-01-31 DIAGNOSIS — I10 Essential (primary) hypertension: Secondary | ICD-10-CM | POA: Diagnosis not present

## 2013-01-31 DIAGNOSIS — G4700 Insomnia, unspecified: Secondary | ICD-10-CM | POA: Diagnosis not present

## 2013-01-31 DIAGNOSIS — G709 Myoneural disorder, unspecified: Secondary | ICD-10-CM | POA: Diagnosis not present

## 2013-01-31 DIAGNOSIS — E039 Hypothyroidism, unspecified: Secondary | ICD-10-CM | POA: Diagnosis not present

## 2013-01-31 DIAGNOSIS — F3289 Other specified depressive episodes: Secondary | ICD-10-CM | POA: Diagnosis not present

## 2013-01-31 DIAGNOSIS — Z79899 Other long term (current) drug therapy: Secondary | ICD-10-CM | POA: Diagnosis not present

## 2013-01-31 DIAGNOSIS — N400 Enlarged prostate without lower urinary tract symptoms: Secondary | ICD-10-CM | POA: Diagnosis not present

## 2013-01-31 DIAGNOSIS — Z87891 Personal history of nicotine dependence: Secondary | ICD-10-CM | POA: Diagnosis not present

## 2013-01-31 DIAGNOSIS — M109 Gout, unspecified: Secondary | ICD-10-CM | POA: Diagnosis not present

## 2013-01-31 DIAGNOSIS — J449 Chronic obstructive pulmonary disease, unspecified: Secondary | ICD-10-CM | POA: Diagnosis not present

## 2013-01-31 DIAGNOSIS — I4891 Unspecified atrial fibrillation: Secondary | ICD-10-CM | POA: Diagnosis not present

## 2013-01-31 DIAGNOSIS — IMO0001 Reserved for inherently not codable concepts without codable children: Secondary | ICD-10-CM | POA: Diagnosis not present

## 2013-01-31 DIAGNOSIS — Z9981 Dependence on supplemental oxygen: Secondary | ICD-10-CM | POA: Diagnosis not present

## 2013-01-31 DIAGNOSIS — M2020 Hallux rigidus, unspecified foot: Secondary | ICD-10-CM | POA: Diagnosis not present

## 2013-01-31 LAB — BASIC METABOLIC PANEL
BUN: 17 mg/dL (ref 6–23)
CO2: 27 mEq/L (ref 19–32)
Calcium: 9.6 mg/dL (ref 8.4–10.5)
Chloride: 104 mEq/L (ref 96–112)
Creatinine, Ser: 1.05 mg/dL (ref 0.50–1.35)
GFR calc Af Amer: 77 mL/min — ABNORMAL LOW (ref >90)
GFR calc non Af Amer: 66 mL/min — ABNORMAL LOW (ref >90)
Glucose, Bld: 87 mg/dL (ref 70–99)
Potassium: 4.2 mEq/L (ref 3.5–5.1)
Sodium: 140 mEq/L (ref 135–145)

## 2013-01-31 NOTE — Telephone Encounter (Signed)
Spoke with pt, Aware of dr crenshaw's recommendations.  °

## 2013-02-01 ENCOUNTER — Encounter (HOSPITAL_BASED_OUTPATIENT_CLINIC_OR_DEPARTMENT_OTHER): Payer: Self-pay | Admitting: *Deleted

## 2013-02-01 ENCOUNTER — Other Ambulatory Visit: Payer: Self-pay | Admitting: Orthopedic Surgery

## 2013-02-02 ENCOUNTER — Encounter (HOSPITAL_BASED_OUTPATIENT_CLINIC_OR_DEPARTMENT_OTHER): Payer: Self-pay | Admitting: Certified Registered Nurse Anesthetist

## 2013-02-02 ENCOUNTER — Ambulatory Visit (HOSPITAL_BASED_OUTPATIENT_CLINIC_OR_DEPARTMENT_OTHER)
Admission: RE | Admit: 2013-02-02 | Discharge: 2013-02-02 | Disposition: A | Discharge status: Home / Self Care | Payer: Medicare Other | Source: Ambulatory Visit | Attending: Orthopedic Surgery | Admitting: Orthopedic Surgery

## 2013-02-02 ENCOUNTER — Encounter (HOSPITAL_BASED_OUTPATIENT_CLINIC_OR_DEPARTMENT_OTHER)
Admission: RE | Disposition: A | Discharge status: Home / Self Care | Payer: Self-pay | Source: Ambulatory Visit | Attending: Orthopedic Surgery

## 2013-02-02 ENCOUNTER — Ambulatory Visit (HOSPITAL_BASED_OUTPATIENT_CLINIC_OR_DEPARTMENT_OTHER): Payer: Medicare Other | Admitting: Certified Registered Nurse Anesthetist

## 2013-02-02 DIAGNOSIS — IMO0001 Reserved for inherently not codable concepts without codable children: Secondary | ICD-10-CM | POA: Insufficient documentation

## 2013-02-02 DIAGNOSIS — I4891 Unspecified atrial fibrillation: Secondary | ICD-10-CM | POA: Insufficient documentation

## 2013-02-02 DIAGNOSIS — G8918 Other acute postprocedural pain: Secondary | ICD-10-CM | POA: Diagnosis not present

## 2013-02-02 DIAGNOSIS — J449 Chronic obstructive pulmonary disease, unspecified: Secondary | ICD-10-CM | POA: Insufficient documentation

## 2013-02-02 DIAGNOSIS — F3289 Other specified depressive episodes: Secondary | ICD-10-CM | POA: Insufficient documentation

## 2013-02-02 DIAGNOSIS — F329 Major depressive disorder, single episode, unspecified: Secondary | ICD-10-CM | POA: Insufficient documentation

## 2013-02-02 DIAGNOSIS — Z79899 Other long term (current) drug therapy: Secondary | ICD-10-CM | POA: Insufficient documentation

## 2013-02-02 DIAGNOSIS — Z87891 Personal history of nicotine dependence: Secondary | ICD-10-CM | POA: Insufficient documentation

## 2013-02-02 DIAGNOSIS — G4700 Insomnia, unspecified: Secondary | ICD-10-CM | POA: Insufficient documentation

## 2013-02-02 DIAGNOSIS — E669 Obesity, unspecified: Secondary | ICD-10-CM | POA: Insufficient documentation

## 2013-02-02 DIAGNOSIS — M109 Gout, unspecified: Secondary | ICD-10-CM | POA: Diagnosis not present

## 2013-02-02 DIAGNOSIS — M79609 Pain in unspecified limb: Secondary | ICD-10-CM | POA: Diagnosis not present

## 2013-02-02 DIAGNOSIS — M2021 Hallux rigidus, right foot: Secondary | ICD-10-CM

## 2013-02-02 DIAGNOSIS — G709 Myoneural disorder, unspecified: Secondary | ICD-10-CM | POA: Insufficient documentation

## 2013-02-02 DIAGNOSIS — M2020 Hallux rigidus, unspecified foot: Secondary | ICD-10-CM | POA: Insufficient documentation

## 2013-02-02 DIAGNOSIS — I10 Essential (primary) hypertension: Secondary | ICD-10-CM | POA: Insufficient documentation

## 2013-02-02 DIAGNOSIS — G4730 Sleep apnea, unspecified: Secondary | ICD-10-CM | POA: Insufficient documentation

## 2013-02-02 DIAGNOSIS — K219 Gastro-esophageal reflux disease without esophagitis: Secondary | ICD-10-CM | POA: Insufficient documentation

## 2013-02-02 DIAGNOSIS — M1990 Unspecified osteoarthritis, unspecified site: Secondary | ICD-10-CM | POA: Insufficient documentation

## 2013-02-02 DIAGNOSIS — J4489 Other specified chronic obstructive pulmonary disease: Secondary | ICD-10-CM | POA: Insufficient documentation

## 2013-02-02 DIAGNOSIS — Z9981 Dependence on supplemental oxygen: Secondary | ICD-10-CM | POA: Insufficient documentation

## 2013-02-02 DIAGNOSIS — G609 Hereditary and idiopathic neuropathy, unspecified: Secondary | ICD-10-CM | POA: Insufficient documentation

## 2013-02-02 DIAGNOSIS — E039 Hypothyroidism, unspecified: Secondary | ICD-10-CM | POA: Insufficient documentation

## 2013-02-02 DIAGNOSIS — N400 Enlarged prostate without lower urinary tract symptoms: Secondary | ICD-10-CM | POA: Insufficient documentation

## 2013-02-02 DIAGNOSIS — Z8701 Personal history of pneumonia (recurrent): Secondary | ICD-10-CM | POA: Insufficient documentation

## 2013-02-02 DIAGNOSIS — M2010 Hallux valgus (acquired), unspecified foot: Secondary | ICD-10-CM | POA: Diagnosis not present

## 2013-02-02 HISTORY — PX: FOOT ARTHRODESIS: SHX1655

## 2013-02-02 HISTORY — DX: Presence of spectacles and contact lenses: Z97.3

## 2013-02-02 HISTORY — DX: Sleep apnea, unspecified: G47.30

## 2013-02-02 HISTORY — DX: Dependence on supplemental oxygen: Z99.81

## 2013-02-02 LAB — POCT HEMOGLOBIN-HEMACUE: Hemoglobin: 14.6 g/dL (ref 13.0–17.0)

## 2013-02-02 SURGERY — FUSION, JOINT, FOOT
Anesthesia: General | Site: Foot | Laterality: Right | Wound class: Clean

## 2013-02-02 MED ORDER — EPHEDRINE SULFATE 50 MG/ML IJ SOLN
INTRAMUSCULAR | Status: DC | PRN
  Administered 2013-02-02 (×2): 10 mg via INTRAVENOUS

## 2013-02-02 MED ORDER — BUPIVACAINE-EPINEPHRINE PF 0.5-1:200000 % IJ SOLN
INTRAMUSCULAR | Status: DC | PRN
  Administered 2013-02-02: 40 mL

## 2013-02-02 MED ORDER — DEXAMETHASONE SODIUM PHOSPHATE 4 MG/ML IJ SOLN
INTRAMUSCULAR | Status: DC | PRN
  Administered 2013-02-02: 4 mg

## 2013-02-02 MED ORDER — BACITRACIN ZINC 500 UNIT/GM EX OINT
TOPICAL_OINTMENT | CUTANEOUS | Status: DC | PRN
  Administered 2013-02-02: 1 via TOPICAL

## 2013-02-02 MED ORDER — DEXAMETHASONE SODIUM PHOSPHATE 10 MG/ML IJ SOLN
INTRAMUSCULAR | Status: DC | PRN
  Administered 2013-02-02: 8 mg via INTRAVENOUS

## 2013-02-02 MED ORDER — HYDROMORPHONE HCL PF 1 MG/ML IJ SOLN
0.2500 mg | INTRAMUSCULAR | Status: DC | PRN
Start: 2013-02-02 — End: 2013-02-02

## 2013-02-02 MED ORDER — FENTANYL CITRATE 0.05 MG/ML IJ SOLN
50.0000 ug | INTRAMUSCULAR | Status: DC | PRN
  Administered 2013-02-02: 100 ug via INTRAVENOUS

## 2013-02-02 MED ORDER — OXYCODONE HCL 5 MG PO TABS: 5.0000 mg | ORAL_TABLET | ORAL | Status: DC | PRN

## 2013-02-02 MED ORDER — PROMETHAZINE HCL 25 MG/ML IJ SOLN: 6.2500 mg | INTRAMUSCULAR | Status: DC | PRN

## 2013-02-02 MED ORDER — SODIUM CHLORIDE 0.9 % IV SOLN: INTRAVENOUS | Status: DC

## 2013-02-02 MED ORDER — PROPOFOL 10 MG/ML IV BOLUS
INTRAVENOUS | Status: DC | PRN
  Administered 2013-02-02: 150 mg via INTRAVENOUS

## 2013-02-02 MED ORDER — LACTATED RINGERS IV SOLN
INTRAVENOUS | Status: DC
Start: 2013-02-02 — End: 2013-02-02
  Administered 2013-02-02 (×2): via INTRAVENOUS

## 2013-02-02 MED ORDER — ONDANSETRON HCL 4 MG/2ML IJ SOLN
INTRAMUSCULAR | Status: DC | PRN
  Administered 2013-02-02: 4 mg via INTRAVENOUS

## 2013-02-02 MED ORDER — FENTANYL CITRATE 0.05 MG/ML IJ SOLN: 50.0000 ug | Freq: Once | INTRAMUSCULAR | Status: DC

## 2013-02-02 MED ORDER — CEFAZOLIN SODIUM-DEXTROSE 2-3 GM-% IV SOLR
2.0000 g | INTRAVENOUS | Status: AC
  Administered 2013-02-02: 2 g via INTRAVENOUS

## 2013-02-02 MED ORDER — CHLORHEXIDINE GLUCONATE 4 % EX LIQD: 60.0000 mL | Freq: Once | CUTANEOUS | Status: DC

## 2013-02-02 MED ORDER — OXYCODONE HCL 5 MG/5ML PO SOLN: 5.0000 mg | Freq: Once | ORAL | Status: DC | PRN

## 2013-02-02 MED ORDER — OXYCODONE HCL 5 MG PO TABS: 5.0000 mg | ORAL_TABLET | Freq: Once | ORAL | Status: DC | PRN

## 2013-02-02 MED ORDER — MIDAZOLAM HCL 2 MG/2ML IJ SOLN: 1.0000 mg | INTRAMUSCULAR | Status: DC | PRN

## 2013-02-02 MED ORDER — MIDAZOLAM HCL 2 MG/2ML IJ SOLN
1.0000 mg | INTRAMUSCULAR | Status: DC | PRN
  Administered 2013-02-02: 2 mg via INTRAVENOUS

## 2013-02-02 MED ORDER — LIDOCAINE HCL (CARDIAC) 20 MG/ML IV SOLN
INTRAVENOUS | Status: DC | PRN
  Administered 2013-02-02: 60 mg via INTRAVENOUS

## 2013-02-02 SURGICAL SUPPLY — 79 items
BAG DECANTER FOR FLEXI CONT (MISCELLANEOUS) IMPLANT
BANDAGE ESMARK 6X9 LF (GAUZE/BANDAGES/DRESSINGS) ×1 IMPLANT
BIT DRILL 2.0 (BIT) ×1
BIT DRILL 2XNS DISP SS SM FRAG (BIT) ×1 IMPLANT
BIT DRL 2XNS DISP SS SM FRAG (BIT) ×1
BLADE AVERAGE 25X9 (BLADE) IMPLANT
BLADE MICRO SAGITTAL (BLADE) IMPLANT
BLADE OSC/SAG .038X5.5 CUT EDG (BLADE) IMPLANT
BLADE SURG 15 STRL LF DISP TIS (BLADE) ×2 IMPLANT
BLADE SURG 15 STRL SS (BLADE) ×2
BNDG COHESIVE 4X5 TAN STRL (GAUZE/BANDAGES/DRESSINGS) ×2 IMPLANT
BNDG COHESIVE 6X5 TAN STRL LF (GAUZE/BANDAGES/DRESSINGS) ×2 IMPLANT
BNDG ESMARK 4X9 LF (GAUZE/BANDAGES/DRESSINGS) IMPLANT
BNDG ESMARK 6X9 LF (GAUZE/BANDAGES/DRESSINGS) ×2
CHLORAPREP W/TINT 26ML (MISCELLANEOUS) ×2 IMPLANT
CLOTH BEACON ORANGE TIMEOUT ST (SAFETY) ×2 IMPLANT
COVER TABLE BACK 60X90 (DRAPES) ×2 IMPLANT
CUFF TOURNIQUET SINGLE 34IN LL (TOURNIQUET CUFF) ×2 IMPLANT
DECANTER SPIKE VIAL GLASS SM (MISCELLANEOUS) IMPLANT
DRAPE C-ARM 42X72 X-RAY (DRAPES) IMPLANT
DRAPE C-ARMOR (DRAPES) IMPLANT
DRAPE EXTREMITY T 121X128X90 (DRAPE) ×2 IMPLANT
DRAPE OEC MINIVIEW 54X84 (DRAPES) ×2 IMPLANT
DRAPE U-SHAPE 47X51 STRL (DRAPES) ×2 IMPLANT
DRSG EMULSION OIL 3X3 NADH (GAUZE/BANDAGES/DRESSINGS) ×2 IMPLANT
DRSG PAD ABDOMINAL 8X10 ST (GAUZE/BANDAGES/DRESSINGS) ×4 IMPLANT
ELECT REM PT RETURN 9FT ADLT (ELECTROSURGICAL) ×2
ELECTRODE REM PT RTRN 9FT ADLT (ELECTROSURGICAL) ×1 IMPLANT
GLOVE BIO SURGEON STRL SZ8 (GLOVE) ×2 IMPLANT
GLOVE BIOGEL PI IND STRL 7.0 (GLOVE) ×1 IMPLANT
GLOVE BIOGEL PI IND STRL 8 (GLOVE) ×1 IMPLANT
GLOVE BIOGEL PI INDICATOR 7.0 (GLOVE) ×1
GLOVE BIOGEL PI INDICATOR 8 (GLOVE) ×1
GLOVE ECLIPSE 6.5 STRL STRAW (GLOVE) ×2 IMPLANT
GLOVE EXAM NITRILE MD LF STRL (GLOVE) IMPLANT
GOWN PREVENTION PLUS XLARGE (GOWN DISPOSABLE) ×2 IMPLANT
GOWN PREVENTION PLUS XXLARGE (GOWN DISPOSABLE) ×2 IMPLANT
K-WIRE ACE 1.6X6 (WIRE) ×4
KWIRE ACE 1.6X6 (WIRE) ×2 IMPLANT
NDL SAFETY ECLIPSE 18X1.5 (NEEDLE) IMPLANT
NEEDLE HYPO 18GX1.5 SHARP (NEEDLE)
NEEDLE HYPO 22GX1.5 SAFETY (NEEDLE) IMPLANT
PACK BASIN DAY SURGERY FS (CUSTOM PROCEDURE TRAY) ×2 IMPLANT
PAD CAST 4YDX4 CTTN HI CHSV (CAST SUPPLIES) ×2 IMPLANT
PADDING CAST ABS 4INX4YD NS (CAST SUPPLIES)
PADDING CAST ABS COTTON 4X4 ST (CAST SUPPLIES) IMPLANT
PADDING CAST COTTON 4X4 STRL (CAST SUPPLIES) ×2
PADDING CAST COTTON 6X4 STRL (CAST SUPPLIES) ×2 IMPLANT
PENCIL BUTTON HOLSTER BLD 10FT (ELECTRODE) ×2 IMPLANT
PLATE SM 1/4 TUBULAR 4H (Plate) ×2 IMPLANT
SANITIZER HAND PURELL 535ML FO (MISCELLANEOUS) ×2 IMPLANT
SCREW CORTICAL 2.7 MM 22MM (Screw) ×4 IMPLANT
SCREW CORTICAL 2.7MM  18MM (Screw) ×2 IMPLANT
SCREW CORTICAL 2.7MM 18MM (Screw) ×2 IMPLANT
SCREW LAG  RD HEAD 4.0 34 LTH (Screw) ×1 IMPLANT
SCREW LAG RD HEAD 4.0 34 LTH (Screw) ×1 IMPLANT
SHEET MEDIUM DRAPE 40X70 STRL (DRAPES) ×2 IMPLANT
SLEEVE SCD COMPRESS KNEE MED (MISCELLANEOUS) ×2 IMPLANT
SPLINT FAST PLASTER 5X30 (CAST SUPPLIES) ×20
SPLINT PLASTER CAST FAST 5X30 (CAST SUPPLIES) ×20 IMPLANT
SPONGE GAUZE 4X4 12PLY (GAUZE/BANDAGES/DRESSINGS) ×2 IMPLANT
SPONGE LAP 18X18 X RAY DECT (DISPOSABLE) ×2 IMPLANT
STAPLER VISISTAT 35W (STAPLE) IMPLANT
STOCKINETTE 6  STRL (DRAPES) ×1
STOCKINETTE 6 STRL (DRAPES) ×1 IMPLANT
STRIP CLOSURE SKIN 1/2X4 (GAUZE/BANDAGES/DRESSINGS) IMPLANT
SUCTION FRAZIER TIP 10 FR DISP (SUCTIONS) ×2 IMPLANT
SUT ETHILON 3 0 PS 1 (SUTURE) ×2 IMPLANT
SUT MNCRL AB 3-0 PS2 18 (SUTURE) ×2 IMPLANT
SUT VIC AB 0 SH 27 (SUTURE) ×2 IMPLANT
SUT VIC AB 2-0 SH 18 (SUTURE) ×2 IMPLANT
SUT VIC AB 2-0 SH 27 (SUTURE)
SUT VIC AB 2-0 SH 27XBRD (SUTURE) IMPLANT
SUT VICRYL 4-0 PS2 18IN ABS (SUTURE) IMPLANT
SYR BULB 3OZ (MISCELLANEOUS) ×2 IMPLANT
TOWEL OR 17X24 6PK STRL BLUE (TOWEL DISPOSABLE) ×2 IMPLANT
TOWEL OR NON WOVEN STRL DISP B (DISPOSABLE) ×2 IMPLANT
TUBE CONNECTING 20X1/4 (TUBING) ×2 IMPLANT
UNDERPAD 30X30 INCONTINENT (UNDERPADS AND DIAPERS) ×2 IMPLANT

## 2013-02-02 NOTE — Anesthesia Preprocedure Evaluation (Addendum)
Anesthesia Evaluation  Patient identified by MRN, date of birth, ID band Patient awake    Reviewed: Allergy & Precautions, H&P , NPO status , Patient's Chart, lab work & pertinent test results  Airway Mallampati: II TM Distance: <3 FB Neck ROM: Full    Dental   Pulmonary sleep apnea , pneumonia -, COPD oxygen dependent, former smoker,  + rhonchi         Cardiovascular hypertension, Pt. on medications + dysrhythmias Rhythm:Irregular Rate:Normal     Neuro/Psych PSYCHIATRIC DISORDERS Depression  Neuromuscular disease    GI/Hepatic GERD-  Medicated,  Endo/Other  Hypothyroidism   Renal/GU      Musculoskeletal  (+) Fibromyalgia -  Abdominal (+) + obese,   Peds  Hematology   Anesthesia Other Findings   Reproductive/Obstetrics                          Anesthesia Physical Anesthesia Plan  ASA: III  Anesthesia Plan: General   Post-op Pain Management:    Induction: Intravenous  Airway Management Planned: LMA  Additional Equipment:   Intra-op Plan:   Post-operative Plan: Extubation in OR  Informed Consent: I have reviewed the patients History and Physical, chart, labs and discussed the procedure including the risks, benefits and alternatives for the proposed anesthesia with the patient or authorized representative who has indicated his/her understanding and acceptance.     Plan Discussed with: CRNA and Surgeon  Anesthesia Plan Comments:         Anesthesia Quick Evaluation

## 2013-02-02 NOTE — Anesthesia Postprocedure Evaluation (Signed)
  Anesthesia Post-op Note  Patient: Jonathan [name]  Procedure(s) Performed: Procedure(s): RIGHT HALLUX METATARSAL PHALANGEAL JOINT ARTHRODESIS  (Right)  Patient Location: PACU  Anesthesia Type:GA combined with regional for post-op pain  Level of Consciousness: awake and alert   Airway and Oxygen Therapy: Patient Spontanous Breathing  Post-op Pain: none  Post-op Assessment: Post-op Vital signs reviewed, Patient's Cardiovascular Status Stable, Respiratory Function Stable, Patent Airway, No signs of Nausea or vomiting, Adequate PO intake and Pain level controlled  Post-op Vital Signs: stable  Complications: No apparent anesthesia complications

## 2013-02-02 NOTE — Brief Op Note (Signed)
02/02/2013  12:11 PM  PATIENT:  Jonathan [name]  79 y.o. male  PRE-OPERATIVE DIAGNOSIS:  RIGHT HALLUX rigidus  POST-OPERATIVE DIAGNOSIS:  same   Procedure(s): 1.  RIGHT HALLUX METATARSAL PHALANGEAL JOINT ARTHRODESIS  2.  AP and lateral radiographs of the right foot  SURGEON:  Toni Arthurs, MD  ASSISTANT: n/a  ANESTHESIA:   General, regional  EBL:  minimal   TOURNIQUET:  50 min at 220 mm Hg  COMPLICATIONS:  None apparent  DISPOSITION:  Extubated, awake and stable to recovery.  DICTATION ID:  [id number]

## 2013-02-02 NOTE — Anesthesia Procedure Notes (Addendum)
Anesthesia Regional Block:  Popliteal block  Pre-Anesthetic Checklist: ,, timeout performed, Correct Patient, Correct Site, Correct Laterality, Correct Procedure, Correct Position, site marked, Risks and benefits discussed,  Surgical consent,  Pre-op evaluation,  At surgeon's request and post-op pain management  Laterality: Right  Prep: chloraprep       Needles:  Injection technique: Single-shot  Needle Type: Echogenic Stimulator Needle      Needle Gauge: 22 and 22 G    Additional Needles:  Procedures: nerve stimulator Popliteal block  Nerve Stimulator or Paresthesia:  Response: 0.48 mA,   Additional Responses:   Narrative:  Start time: 02/02/2013 10:30 AM End time: 02/02/2013 10:42 AM Injection made incrementally with aspirations every 5 mL. Anesthesiologist: Dr Gypsy Balsam  Additional Notes: [id number] R Popliteal Nerve Block POP CHG prep, sterile tech #22 stim/echo needle with stim down to .48ma Multiple neg asp Marc .55 w/epi 1:200000 40 cc+decadron 4mg  infiltrated No compl Dr Gypsy Balsam   Procedure Name: LMA Insertion Date/Time: 02/02/2013 10:56 AM Performed by: Anielle Headrick D Pre-anesthesia Checklist: Patient identified, Emergency Drugs available, Suction available and Patient being monitored Patient Re-evaluated:Patient Re-evaluated prior to inductionOxygen Delivery Method: Circle System Utilized Preoxygenation: Pre-oxygenation with 100% oxygen Intubation Type: IV induction Ventilation: Mask ventilation without difficulty LMA: LMA inserted LMA Size: 4.0 Number of attempts: 1 Airway Equipment and Method: bite block Placement Confirmation: positive ETCO2 Tube secured with: Tape Dental Injury: Teeth and Oropharynx as per pre-operative assessment

## 2013-02-02 NOTE — Transfer of Care (Signed)
Immediate Anesthesia Transfer of Care Note  Patient: Jonathan [name]  Procedure(s) Performed: Procedure(s): RIGHT HALLUX METATARSAL PHALANGEAL JOINT ARTHRODESIS  (Right)  Patient Location: PACU  Anesthesia Type:GA combined with regional for post-op pain  Level of Consciousness: awake and patient cooperative  Airway & Oxygen Therapy: Patient Spontanous Breathing and Patient connected to face mask oxygen  Post-op Assessment: Report given to PACU RN and Post -op Vital signs reviewed and stable  Post vital signs: Reviewed and stable  Complications: No apparent anesthesia complications

## 2013-02-02 NOTE — H&P (Signed)
Jonathan [name] is an 79 y.o. male.   Chief Complaint: right forefoot pain HPI: 79 y/o male with right forefoot pain from hallux rigidus.  Pt presents now for right hallux MP joint arthrodesis.  Past Medical History  Diagnosis Date  . Bronchitis   . Hypertension   . Gout   . BPH (benign prostatic hyperplasia)   . Spinal compression fracture seventh vertebre  . COPD (chronic obstructive pulmonary disease)     2 liters O2 HS  . Hypoxia   . Fibromyalgia   . Peripheral neuropathy   . Osteoarthritis   . GERD (gastroesophageal reflux disease)   . Hypothyroidism   . Insomnia   . Rotator cuff tear, left   . Glaucoma     bilateral eyes  . Fatty tumor     waste and back  . Atrial fibrillation   . Depression   . Cataract   . Wears glasses   . On home oxygen therapy     at night with cpap  . Sleep apnea     uses cpap-add oxygen at night    Past Surgical History  Procedure Laterality Date  . Tonsil    . Appendectomy  age 10  . Nasal concha bullosa resection  age 37  . Vasectomy  age 10  . Inguinal hernia repair  age 59    rt side  . Cholecystectomy  age 64  . Cataract extraction      bilateral  . Eye surgery    . Prostate surgery    . Shoulder arthroscopy w/ rotator cuff repair      right  . Carpal tunnel release      right    Family History  Problem Relation Age of Onset  . Coronary artery disease Brother   . Heart disease Father   . Lung cancer Father   . Kidney cancer Father   . Prostate cancer Father   . Arthritis Mother   . Lung cancer Mother    Social History:  reports that he quit smoking about 37 years ago. His smoking use included Cigarettes. He has a 20 pack-year smoking history. He has never used smokeless tobacco. He reports that  drinks alcohol. He reports that he does not use illicit drugs.  Allergies: No Known Allergies  No prescriptions prior to admission    Results for orders placed during the hospital encounter of 02/02/13 (from the past 48  hour(s))  BASIC METABOLIC PANEL     Status: Abnormal   Collection Time    01/31/13  2:30 PM      Result Value Range   Sodium 140  135 - 145 mEq/L   Potassium 4.2  3.5 - 5.1 mEq/L   Chloride 104  96 - 112 mEq/L   CO2 27  19 - 32 mEq/L   Glucose, Bld 87  70 - 99 mg/dL   BUN 17  6 - 23 mg/dL   Creatinine, Ser 1.19  0.50 - 1.35 mg/dL   Calcium 9.6  8.4 - 14.7 mg/dL   GFR calc non Af Amer 66 (*) >90 mL/min   GFR calc Af Amer 77 (*) >90 mL/min   Comment: (NOTE)     The eGFR has been calculated using the CKD EPI equation.     This calculation has not been validated in all clinical situations.     eGFR's persistently <90 mL/min signify possible Chronic Kidney     Disease.   No results found.  ROS  No recent f/c/nv//wt loss  Height 5' 6.5" (1.689 m), weight 89.359 kg (197 lb). Physical Exam  wn wd male in nad.  A and O.  Mood and affect normal. EOMI.  resp unlabored.  R hallux with decreased ROM at MPJ.  Pain with grind test.  Sens to LT intact at the foot and hallux.  5/5 strength in PF and DF of the ankle and toes.  No lymphadenopathy.  Assessment/Plan Right hallux rigidus - to OR for right hallux MPH arthrodesis.  The risks and benefits of the alternative treatment options have been discussed in detail.  The patient wishes to proceed with surgery and specifically understands risks of bleeding, infection, nerve damage, blood clots, need for additional surgery, amputation and death.   Toni Arthurs Feb 23, 2013, 7:19 AM

## 2013-02-02 NOTE — Progress Notes (Signed)
Assisted Dr. Gypsy Balsam with right, popliteal block. Side rails up, monitors on throughout procedure. See vital signs in flow sheet. Tolerated Procedure well.

## 2013-02-03 ENCOUNTER — Encounter (HOSPITAL_BASED_OUTPATIENT_CLINIC_OR_DEPARTMENT_OTHER): Payer: Self-pay | Admitting: Orthopedic Surgery

## 2013-02-03 NOTE — Op Note (Signed)
NAMEMAHLIK, LENN NO.:  [id number]  MEDICAL RECORD NO.:  [id number]  LOCATION:                                 FACILITY:  PHYSICIAN:  Toni Arthurs, MD             DATE OF BIRTH:  DATE OF PROCEDURE:  02/02/2013 DATE OF DISCHARGE:                              OPERATIVE REPORT   PREOPERATIVE DIAGNOSIS:  Right hallux rigidus.  POSTOPERATIVE DIAGNOSIS:  Right hallux rigidus.  PROCEDURES: 1. Right hallux metatarsophalangeal joint arthrodesis. 2. AP and lateral radiographs of the right foot.  SURGEON:  Toni Arthurs, MD.  ANESTHESIA:  General, regional.  ESTIMATED BLOOD LOSS:  Minimal.  TOURNIQUET TIME:  50 minutes at 220 mmHg.  COMPLICATIONS:  None apparent.  DISPOSITION:  Extubated, awake, and stable to recovery.  INDICATION FOR PROCEDURE:  The patient is a 79 year old male with painful right forefoot.  He has hallux rigidus.  Signs and symptoms and has failed nonoperative treatment to date including activity modification, oral anti-inflammatories, and shoe wear modification.  He presents now for operative treatment of this condition.  He understands the risks, benefits, alternative treatment options and elects surgical treatment.  He specifically understands risks of bleeding, infection, nerve damage, blood clots, need for additional surgery, amputation, and death.  PROCEDURE IN DETAIL:  After preoperative consent was obtained, and the correct operative site was identified.  The patient was brought to the operating room and placed supine on the operating table.  General anesthesia was induced.  Preoperative antibiotics were administered. Surgical time-out was taken.  The right lower extremity was prepped and draped in standard sterile fashion with tourniquet around the thigh. The extremity was exsanguinated and tourniquet was inflated to 220 mmHg. A longitudinal incision was made over the hallux MP joint.  Sharp dissection was carried down through  the skin and subcutaneous tissue.  A capsulotomy was made medial to the extensor hallucis longus tendon.  The tendon was protected throughout the case.  The extensor hallucis brevis tendon was mobilized and retracted laterally.  The metatarsal head was exposed and cleared of all peripheral osteophytes.  A K-wire was inserted into the central portion of the head.  A 20-mm convex reamer was then placed over the guide pin and used to ream away the subchondral bone and remaining articular cartilage.  The K-wire was removed and placed into the central portion of the base of the proximal phalanx.  A 20-mm convex reamer was then used to remove all the remaining articular cartilage and subchondral bone.  The wound was irrigated copiously removing all debris.  A 2-mm drill bit was then used to perforate the metatarsal head and base of the proximal phalanx and over the joint surfaces.  The resultant bone graft was left in place.  The joint was reduced.  It was held in a reduced position and a K-wire was placed across the joint provisionally holding it.  AP and lateral fluoroscopic images confirmed appropriate position of the hallux MP joint.  A simulated weightbearing exam was performed with a footplate.  The toe was noted to be appropriately positioned.  A second K-wire was then placed percutaneously obliquely across  the joint from proximal medial to distal lateral.  AP and lateral fluoroscopic images confirmed appropriate position of the screw.  A stab incision was made.  A 4-mm partially threaded cannulated screw was inserted over the guide pin.  It was noted to have excellent purchase.  The guide pins were removed.  The 4 hole 1/4 tubular plate was selected from the [employer] mini frag set.  It was contoured to fit the dorsal portion of the joint.  It was fixed proximally with a unicortical screw in a bicortical screw, and distally with a unicortical screw and a bicortical screw.  The wound  was irrigated copiously.  Final AP and lateral fluoroscopic images confirmed appropriate position and length of all hardware and appropriate reduction of the joint surfaces.  The dorsal joint capsule was closed over the plate with simple sutures of 2-0 Vicryl.  The subcutaneous tissue was approximated with inverted simple sutures of 3-0 Monocryl. The skin incisions were closed with a running 3-0 nylon.  Sterile dressings were applied followed by a well-padded short-leg splint. Tourniquet was released at 50 minutes.  The patient was awakened by anesthesia and transported to recovery room in stable condition.  FOLLOWUP PLAN:  The patient will be nonweightbearing on the right lower extremity in a splint.  He will follow up with me in 2 weeks for suture removal and conversion to a walking boot.  RADIOGRAPHS:  AP and lateral, simulated weightbearing radiographs of the right foot were obtained intraoperatively today.  These show interval arthrodesis of the hallux MP joint, with appropriately positioned hardware and an appropriate reduced joint.  The fracture dislocation was noted.  Alignment otherwise normal.     Toni Arthurs, MD     JH/MEDQ  D:  02/02/2013  T:  02/02/2013  Job:  [id number]

## 2013-02-06 NOTE — Telephone Encounter (Signed)
Yes, med was sent.

## 2013-02-07 ENCOUNTER — Encounter (HOSPITAL_BASED_OUTPATIENT_CLINIC_OR_DEPARTMENT_OTHER): Payer: Self-pay | Admitting: Orthopedic Surgery

## 2013-02-08 ENCOUNTER — Encounter (HOSPITAL_BASED_OUTPATIENT_CLINIC_OR_DEPARTMENT_OTHER): Payer: Self-pay | Admitting: Orthopedic Surgery

## 2013-02-22 ENCOUNTER — Other Ambulatory Visit: Payer: Self-pay | Admitting: Cardiology

## 2013-03-15 DIAGNOSIS — Z4789 Encounter for other orthopedic aftercare: Secondary | ICD-10-CM | POA: Diagnosis not present

## 2013-03-21 ENCOUNTER — Other Ambulatory Visit: Payer: Self-pay | Admitting: Family Medicine

## 2013-03-22 ENCOUNTER — Encounter: Payer: Self-pay | Admitting: Family Medicine

## 2013-03-31 DIAGNOSIS — Z4789 Encounter for other orthopedic aftercare: Secondary | ICD-10-CM | POA: Diagnosis not present

## 2013-04-12 DIAGNOSIS — Z4789 Encounter for other orthopedic aftercare: Secondary | ICD-10-CM | POA: Diagnosis not present

## 2013-04-12 DIAGNOSIS — M2040 Other hammer toe(s) (acquired), unspecified foot: Secondary | ICD-10-CM | POA: Diagnosis not present

## 2013-04-30 ENCOUNTER — Other Ambulatory Visit: Payer: Self-pay | Admitting: Family Medicine

## 2013-05-17 DIAGNOSIS — E039 Hypothyroidism, unspecified: Secondary | ICD-10-CM | POA: Diagnosis not present

## 2013-05-17 DIAGNOSIS — M109 Gout, unspecified: Secondary | ICD-10-CM | POA: Diagnosis not present

## 2013-05-17 DIAGNOSIS — K219 Gastro-esophageal reflux disease without esophagitis: Secondary | ICD-10-CM | POA: Diagnosis not present

## 2013-05-17 DIAGNOSIS — I4891 Unspecified atrial fibrillation: Secondary | ICD-10-CM | POA: Diagnosis not present

## 2013-05-25 DIAGNOSIS — R269 Unspecified abnormalities of gait and mobility: Secondary | ICD-10-CM | POA: Diagnosis not present

## 2013-06-30 DIAGNOSIS — E039 Hypothyroidism, unspecified: Secondary | ICD-10-CM | POA: Diagnosis not present

## 2013-06-30 DIAGNOSIS — M109 Gout, unspecified: Secondary | ICD-10-CM | POA: Diagnosis not present

## 2013-06-30 DIAGNOSIS — I4891 Unspecified atrial fibrillation: Secondary | ICD-10-CM | POA: Diagnosis not present

## 2013-06-30 DIAGNOSIS — Z0000 Encounter for general adult medical examination without abnormal findings: Secondary | ICD-10-CM | POA: Diagnosis not present

## 2013-06-30 DIAGNOSIS — E669 Obesity, unspecified: Secondary | ICD-10-CM | POA: Diagnosis not present

## 2013-06-30 DIAGNOSIS — M810 Age-related osteoporosis without current pathological fracture: Secondary | ICD-10-CM | POA: Diagnosis not present

## 2013-07-03 DIAGNOSIS — IMO0001 Reserved for inherently not codable concepts without codable children: Secondary | ICD-10-CM | POA: Diagnosis not present

## 2013-07-03 DIAGNOSIS — M5380 Other specified dorsopathies, site unspecified: Secondary | ICD-10-CM | POA: Diagnosis not present

## 2013-07-03 DIAGNOSIS — M5137 Other intervertebral disc degeneration, lumbosacral region: Secondary | ICD-10-CM | POA: Diagnosis not present

## 2013-07-03 DIAGNOSIS — G894 Chronic pain syndrome: Secondary | ICD-10-CM | POA: Diagnosis not present

## 2013-07-06 ENCOUNTER — Ambulatory Visit (INDEPENDENT_AMBULATORY_CARE_PROVIDER_SITE_OTHER): Payer: Medicare Other | Admitting: Emergency Medicine

## 2013-07-06 ENCOUNTER — Encounter: Payer: Self-pay | Admitting: Emergency Medicine

## 2013-07-06 VITALS — BP 130/82 | HR 63 | Ht 66.5 in | Wt 214.8 lb

## 2013-07-06 DIAGNOSIS — G4733 Obstructive sleep apnea (adult) (pediatric): Secondary | ICD-10-CM

## 2013-07-06 DIAGNOSIS — J449 Chronic obstructive pulmonary disease, unspecified: Secondary | ICD-10-CM | POA: Diagnosis not present

## 2013-07-06 MED ORDER — MOMETASONE FURO-FORMOTEROL FUM 200-5 MCG/ACT IN AERO: 2.0000 | INHALATION_SPRAY | Freq: Two times a day (BID) | RESPIRATORY_TRACT | Status: DC

## 2013-07-06 MED ORDER — LEVALBUTEROL TARTRATE 45 MCG/ACT IN AERO: 1.0000 | INHALATION_SPRAY | RESPIRATORY_TRACT | Status: DC | PRN

## 2013-07-06 NOTE — Progress Notes (Signed)
Subjective:    Patient ID: Jonathan [name], male    DOB: 10/05/1932, 80 y.o.   MRN: [id number] HPI 80 yo man, former smoker, hx of A Fib, COPD on nocturnal O2, GERD, also OSA not on CPAP for over 4 yrs. He has had PFT done in [place] Fall 2012 that supported COPD, walking oximetry that showed ? Hypoxemia, ONO with desats and started on . Began to have AE +/- PNA in 1/13 that never really got better. He was admitted to [facility] 2/4-07/21/11 for an AE-COPD associated with A fib + RVR.  Was rx steroids, abx, BD's. He is better but is limited to some degree, not back to prior baseline. He is low on energy. He is having trouble sleeping, difficulty falling asleep and staying asleep. He snores, no witnessed apnea. No real wheezing or coughing at this time.   ROV 10/07/11 -- 78yom hx of A Fib, COPD on nocturnal O2, GERD, also OSA not on CPAP for over 50yrs. Returns after PSG 09/17/11, shows AHI 27/hour. Also obtained his PFT from [facility] from 11/12 - FEV1 71% predicted with positive BD response. Still having trouble w exertion, limited by arthritis more than breathing.Tolerating Symbicort, not really sure it is helping him.    ROV 12/04/11 -- 78yom hx of A Fib, COPD on nocturnal O2, GERD, OSA. We recently started back on CPAP, his download data 5/18-6/15 shows he wears it longer than 4 hours for 58% of the nights. He is working on increasing compliance. All barriers discussed, should be able to overcome. He has new mask that is better. He now has O2 bled in and has been wearing it all night.  Last time we stopped Symbicort - he hasn't missed it. Hasn't used the SABA.   ROV 06/20/12 -- 80 yo man, hx COPD/+BD response, OSA. He is getting his CPAP thru [place], his O2 thru [place] [facility]. Uses albuterol very rarely. He still has exertional SOB. He is trying to get new CPAP mask, the company hasn't gotten it to him yet. He hasn't been using CPAP for about two weeks - he is planning to go back to it.   ROV 07/06/13 --  follows up for COPD/+BD response, OSA.  He has done fairly well over the last year although he has noticed an increased WOB and wheeze since he started moving his house furniture to new home.  He has combivent and albuterol (old) available and has tried them w some relief. No longer on scheduled meds. No flares. He is also concerned that his CPAP pressures may not be adequate. Wants new company for CPAP > [facility]. Changing MD's to Dr Armstead Peaks.      Objective:   Physical Exam Filed Vitals:   07/06/13 0913  BP: 130/82  Pulse: 63  Height: 5' 6.5" (1.689 m)  Weight: 214 lb 12.8 oz (97.433 kg)  SpO2: 95%    Gen: Pleasant, well-nourished, in no distress,  normal affect  ENT: No lesions,  mouth clear,  oropharynx clear, no postnasal drip  Neck: No JVD, no TMG, no carotid bruits  Lungs: No use of accessory muscles, no dullness to percussion, clear without rales or rhonchi  Cardiovascular: RRR, heart sounds normal, no murmur or gallops, no peripheral edema  Musculoskeletal: No deformities, no cyanosis or clubbing  Neuro: alert, non focal  Skin: Warm, no lesions or rashes     Assessment & Plan:  COPD (chronic obstructive pulmonary disease) Frequent sx with and without exertion. Believe  we need to retry scheduled meds.  - spirometry today - start Dulera x 1 month and assess response - xopenex prn > new script - rov 6 weeks  Sleep apnea, obstructive - change to [facility] for CPAP supplies - needs retitration home study

## 2013-07-06 NOTE — Patient Instructions (Signed)
We will try Dulera 2 puffs twice a day until next visit.  Use Xopenex 2 puffs as needed for shortness of breath Stop the Combivent We will change your CPAP company to [facility] and ask them to perform a home CPAP titration study.  We will repeat your breathing tests today Follow with Dr Lamonte Sakai in 6 - 8 weeks

## 2013-07-06 NOTE — Assessment & Plan Note (Signed)
Frequent sx with and without exertion. Believe we need to retry scheduled meds.  - spirometry today - start Dulera x 1 month and assess response - xopenex prn > new script - rov 6 weeks

## 2013-07-06 NOTE — Assessment & Plan Note (Signed)
-   change to [facility] for CPAP supplies - needs retitration home study

## 2013-07-10 DIAGNOSIS — N390 Urinary tract infection, site not specified: Secondary | ICD-10-CM | POA: Diagnosis not present

## 2013-07-10 DIAGNOSIS — I4891 Unspecified atrial fibrillation: Secondary | ICD-10-CM | POA: Diagnosis not present

## 2013-07-10 DIAGNOSIS — K219 Gastro-esophageal reflux disease without esophagitis: Secondary | ICD-10-CM | POA: Diagnosis not present

## 2013-07-10 DIAGNOSIS — E039 Hypothyroidism, unspecified: Secondary | ICD-10-CM | POA: Diagnosis not present

## 2013-07-10 DIAGNOSIS — J449 Chronic obstructive pulmonary disease, unspecified: Secondary | ICD-10-CM | POA: Diagnosis not present

## 2013-07-10 DIAGNOSIS — M810 Age-related osteoporosis without current pathological fracture: Secondary | ICD-10-CM | POA: Diagnosis not present

## 2013-07-11 ENCOUNTER — Telehealth: Payer: Self-pay | Admitting: Emergency Medicine

## 2013-07-11 DIAGNOSIS — G4733 Obstructive sleep apnea (adult) (pediatric): Secondary | ICD-10-CM

## 2013-07-11 NOTE — Telephone Encounter (Signed)
I spoke with [facility] and was advised that they need to know what setting the pt is on and how long you want the auto titration for? I called the pt old company and they state that the pt is currently on an auto setting. Please advise. [place] Bing, CMA

## 2013-07-11 NOTE — Telephone Encounter (Signed)
That sounds good - 2 weeks

## 2013-07-11 NOTE — Telephone Encounter (Signed)
I didn't realize that he was already on an auto-device. If we are going to get a new machine then he needs auto-titration with a range of 5-20 cm H2O

## 2013-07-11 NOTE — Telephone Encounter (Signed)
Spoke with Rodena Piety with [employer]. Advised her of the setting RB would like the pt to set at. Order will be placed.

## 2013-07-11 NOTE — Telephone Encounter (Signed)
How long will the pt need to be on this setting? 2 weeks? Then send a download? Thanks.

## 2013-07-15 ENCOUNTER — Other Ambulatory Visit: Payer: Self-pay | Admitting: Physician Assistant

## 2013-07-20 DIAGNOSIS — H4011X0 Primary open-angle glaucoma, stage unspecified: Secondary | ICD-10-CM | POA: Diagnosis not present

## 2013-07-20 DIAGNOSIS — H409 Unspecified glaucoma: Secondary | ICD-10-CM | POA: Diagnosis not present

## 2013-07-28 DIAGNOSIS — R404 Transient alteration of awareness: Secondary | ICD-10-CM | POA: Diagnosis not present

## 2013-08-15 ENCOUNTER — Encounter: Payer: Self-pay | Admitting: Cardiology

## 2013-08-15 ENCOUNTER — Ambulatory Visit (INDEPENDENT_AMBULATORY_CARE_PROVIDER_SITE_OTHER): Payer: Medicare Other | Admitting: Cardiology

## 2013-08-15 VITALS — BP 147/79 | HR 98 | Ht 66.5 in | Wt 196.6 lb

## 2013-08-15 DIAGNOSIS — R0989 Other specified symptoms and signs involving the circulatory and respiratory systems: Secondary | ICD-10-CM | POA: Diagnosis not present

## 2013-08-15 DIAGNOSIS — I4891 Unspecified atrial fibrillation: Secondary | ICD-10-CM

## 2013-08-15 MED ORDER — ASPIRIN EC 325 MG PO TBEC: 325.0000 mg | DELAYED_RELEASE_TABLET | Freq: Every day | ORAL | Status: DC

## 2013-08-15 NOTE — Assessment & Plan Note (Signed)
Schedule ultrasound to exclude aneurysm. 

## 2013-08-15 NOTE — Assessment & Plan Note (Signed)
Patient remains in sinus rhythm. Continue Cardizem. He is falling frequently. I therefore feel the risk of anti-coagulation outweighs the benefit. Discontinue xeralto and begin aspirin 325 mg daily. He understands the higher risk of stroke with aspirin compared to full anticoagulation.

## 2013-08-15 NOTE — Patient Instructions (Signed)
Your physician wants you to follow-up in: [place] will receive a reminder letter in the mail two months in advance. If you don't receive a letter, please call our office to schedule the follow-up appointment.   STOP XARELTO  START ASPIRIN Jonathan [name]  Your physician has requested that you have an abdominal aorta duplex. During this test, an ultrasound is used to evaluate the aorta. Allow 30 minutes for this exam. Do not eat after midnight the day before and avoid carbonated beverages

## 2013-08-15 NOTE — Assessment & Plan Note (Signed)
Blood pressure is mildly elevated today. However he follows this at home and it is typically controlled. Continue present medications and follow.

## 2013-08-15 NOTE — Progress Notes (Signed)
HPI: FU atrial fibrillation. Previously with a fib but converted spontaneously to sinus rhythm. Note the patient had an echocardiogram in [place] in November of 2012 that showed an ejection fraction of 59% with no valvular abnormalities. Cardiac markers were negative. D-dimer and VQ scan negative. TSH normal. Patient noted to have renal insufficiency and therefore was started on xeralto 15 mg daily. Outpatient nuclear study performed in February of 2013 showed normal perfusion and an ejection fraction of 65%. Patient also with significant COPD and is now followed by pulmonary. Since I last saw him in Jan 2014, the patient has dyspnea with more extreme activities but not with routine activities. It is relieved with rest. It is not associated with chest pain. There is no orthopnea, PND or pedal edema. There is no syncope or palpitations. There is no exertional chest pain. It should be noted that he falls frequently. He states he occasionally catches himself but has hit the ground 6-8 times in the past 6 months.    Current Outpatient Prescriptions  Medication Sig Dispense Refill  . acetaminophen (TYLENOL) 500 MG tablet Take 2 tablets (1,000 mg total) by mouth 2 (two) times daily.  360 tablet  1  . allopurinol (ZYLOPRIM) 300 MG tablet Take 1 tablet (300 mg total) by mouth daily.  90 tablet  3  . Calcium Lactate 750 MG TABS Take 1 tablet by mouth 2 (two) times daily.      . colchicine 0.6 MG tablet Take 1 tablet (0.6 mg total) by mouth as needed.  90 tablet  3  . cyclobenzaprine (FLEXERIL) 10 MG tablet Take 10 mg by mouth at bedtime. 1/2 tablet weekly For muscle spasms      . diltiazem (CARDIZEM CD) 180 MG 24 hr capsule Take 1 capsule (180 mg total) by mouth daily.  90 capsule  3  . doxazosin (CARDURA) 8 MG tablet Take 1 tablet (8 mg total) by mouth daily. PATIENT NEEDS OFFICE VISIT FOR ADDITIONAL REFILLS  30 tablet  0  . fluticasone (FLONASE) 50 MCG/ACT nasal spray Place 2 sprays into the nose  2 (two) times daily.      Marland Kitchen latanoprost (XALATAN) 0.005 % ophthalmic solution Place 1 drop into both eyes at bedtime.  7.5 mL  3  . levalbuterol (XOPENEX HFA) 45 MCG/ACT inhaler Inhale 1-2 puffs into the lungs every 4 (four) hours as needed for wheezing.  1 Inhaler  12  . levothyroxine (SYNTHROID, LEVOTHROID) 75 MCG tablet Take 1 tablet (75 mcg total) by mouth daily before breakfast.  90 tablet  3  . mometasone-formoterol (DULERA) 200-5 MCG/ACT AERO Inhale 2 puffs into the lungs 2 (two) times daily.  1 Inhaler  11  . Multiple Vitamin (MULTIVITAMIN) tablet Take 1 tablet by mouth daily.      Marland Kitchen oxyCODONE (ROXICODONE) 5 MG immediate release tablet Take 1-2 tablets (5-10 mg total) by mouth every 4 (four) hours as needed for pain.  50 tablet  0  . OXYCODONE HCL PO Take 10 mg by mouth as needed.      . pregabalin (LYRICA) 100 MG capsule 100 mg. Take one every morning and 2 every evening for peripheral neuropathy      . sertraline (ZOLOFT) 100 MG tablet Take 100 mg by mouth daily.      . tadalafil (CIALIS) 20 MG tablet Take 20 mg by mouth daily as needed. For erectile dysfunction      . temazepam (RESTORIL) 15 MG capsule Take 1 capsule (  15 mg total) by mouth at bedtime as needed. For sleep  30 capsule  3  . XARELTO 20 MG TABS tablet TAKE 1 TABLET (20 MG TOTAL) BY MOUTH DAILY WITH SUPPER.  90 tablet  0  . zaleplon (SONATA) 10 MG capsule Take 1 capsule (10 mg total) by mouth at bedtime as needed.  30 capsule  3  . zolpidem (AMBIEN) 10 MG tablet Take 0.5 tablets (5 mg total) by mouth at bedtime as needed. For sleep  30 tablet  3  . [DISCONTINUED] calcium-vitamin D (OSCAL WITH D) 500-200 MG-UNIT per tablet Take 1 tablet by mouth daily.      . [DISCONTINUED] diphenhydrAMINE (SOMINEX) 25 MG tablet Take 50 mg by mouth at bedtime as needed. For sleep      . [DISCONTINUED] mometasone (NASONEX) 50 MCG/ACT nasal spray Place 2 sprays into the nose daily.       No current facility-administered medications for this  visit.     Past Medical History  Diagnosis Date  . Bronchitis   . Hypertension   . Gout   . BPH (benign prostatic hyperplasia)   . Spinal compression fracture seventh vertebre  . COPD (chronic obstructive pulmonary disease)     2 liters O2 HS  . Hypoxia   . Fibromyalgia   . Peripheral neuropathy   . Osteoarthritis   . GERD (gastroesophageal reflux disease)   . Hypothyroidism   . Insomnia   . Rotator cuff tear, left   . Glaucoma     bilateral eyes  . Fatty tumor     waste and back  . Atrial fibrillation   . Depression   . Cataract   . Wears glasses   . On home oxygen therapy     at night with cpap  . Sleep apnea     uses cpap-add oxygen at night    Past Surgical History  Procedure Laterality Date  . Tonsil    . Appendectomy  age 33  . Nasal concha bullosa resection  age 34  . Vasectomy  age 80  . Inguinal hernia repair  age 73    rt side  . Cholecystectomy  age 56  . Cataract extraction      bilateral  . Eye surgery    . Prostate surgery    . Shoulder arthroscopy w/ rotator cuff repair Right     early 2000s  . Carpal tunnel release Right     early 2000s  . Foot arthrodesis Right 02/02/2013    Procedure: RIGHT HALLUX METATARSAL PHALANGEAL JOINT ARTHRODESIS ;  Surgeon: Wylene Simmer, MD;  Location: [facility];  Service: Orthopedics;  Laterality: Right;    History   Social History  . Marital Status: Single    Spouse Name: N/A    Number of Children: N/A  . Years of Education: N/A   Occupational History  . RETIRED    Social History Main Topics  . Smoking status: Former Smoker -- 2.00 packs/day for 10 years    Types: Cigarettes    Quit date: 07/18/1975  . Smokeless tobacco: Never Used  . Alcohol Use: Yes     Comment: 1-2 drinks/day  . Drug Use: No  . Sexual Activity: Not on file   Other Topics Concern  . Not on file   Social History Narrative  . No narrative on file    ROS: no fevers or chills, productive cough, hemoptysis,  dysphasia, odynophagia, melena, hematochezia, dysuria, hematuria, rash, seizure activity, orthopnea, PND,  pedal edema, claudication. Remaining systems are negative.  Physical Exam: Well-developed well-nourished in no acute distress.  Skin is warm and dry.  HEENT is normal.  Neck is supple.  Chest mildly diminished breath sounds throughout Cardiovascular exam is regular rate and rhythm.  Abdominal exam nontender or distended. No masses palpated. Extremities show no edema. neuro grossly intact  ECG sinus bradycardia with no ST changes. Cannot rule out prior septal infarct.

## 2013-08-22 ENCOUNTER — Encounter (HOSPITAL_COMMUNITY): Payer: Medicare Other

## 2013-08-29 ENCOUNTER — Ambulatory Visit (INDEPENDENT_AMBULATORY_CARE_PROVIDER_SITE_OTHER): Payer: Medicare Other | Admitting: Emergency Medicine

## 2013-08-29 ENCOUNTER — Encounter: Payer: Self-pay | Admitting: Emergency Medicine

## 2013-08-29 VITALS — BP 130/82 | HR 57 | Ht 66.6 in | Wt 199.0 lb

## 2013-08-29 DIAGNOSIS — J449 Chronic obstructive pulmonary disease, unspecified: Secondary | ICD-10-CM

## 2013-08-29 DIAGNOSIS — G4733 Obstructive sleep apnea (adult) (pediatric): Secondary | ICD-10-CM

## 2013-08-29 DIAGNOSIS — J4489 Other specified chronic obstructive pulmonary disease: Secondary | ICD-10-CM

## 2013-08-29 NOTE — Patient Instructions (Signed)
Please continue your CPAP every night. We will change the startup pressure to 10cmH2O. If this feels too high then please let our office know Continue Dulera and xopenex as you are taking them Follow with Dr Lamonte Sakai in 6 months or sooner if you have any problems

## 2013-08-29 NOTE — Assessment & Plan Note (Signed)
-   will raise the starting pressure to 10, see how he tolerates. If too strong then will lower it.

## 2013-08-29 NOTE — Progress Notes (Signed)
Subjective:    Patient ID: Jonathan [name], male    DOB: 18-Apr-1933, 80 y.o.   MRN: [id number] HPI 80 yo man, former smoker, hx of A Fib, COPD on nocturnal O2, GERD, also OSA not on CPAP for over 4 yrs. He has had PFT done in [place] Fall 2012 that supported COPD, walking oximetry that showed ? Hypoxemia, ONO with desats and started on . Began to have AE +/- PNA in 1/13 that never really got better. He was admitted to [facility] 2/4-07/21/11 for an AE-COPD associated with A fib + RVR.  Was rx steroids, abx, BD's. He is better but is limited to some degree, not back to prior baseline. He is low on energy. He is having trouble sleeping, difficulty falling asleep and staying asleep. He snores, no witnessed apnea. No real wheezing or coughing at this time.   ROV 10/07/11 -- 78yom hx of A Fib, COPD on nocturnal O2, GERD, also OSA not on CPAP for over 2yrs. Returns after PSG 09/17/11, shows AHI 27/hour. Also obtained his PFT from [place] [facility] from 11/12 - FEV1 71% predicted with positive BD response. Still having trouble w exertion, limited by arthritis more than breathing.Tolerating Symbicort, not really sure it is helping him.    ROV 12/04/11 -- 78yom hx of A Fib, COPD on nocturnal O2, GERD, OSA. We recently started back on CPAP, his download data 5/18-6/15 shows he wears it longer than 4 hours for 58% of the nights. He is working on increasing compliance. All barriers discussed, should be able to overcome. He has new mask that is better. He now has O2 bled in and has been wearing it all night.  Last time we stopped Symbicort - he hasn't missed it. Hasn't used the SABA.   ROV 06/20/12 -- 80 yo man, hx COPD/+BD response, OSA. He is getting his CPAP thru [place], his O2 thru [place]. Uses albuterol very rarely. He still has exertional SOB. He is trying to get new CPAP mask, the company hasn't gotten it to him yet. He hasn't been using CPAP for about two weeks - he is planning to go back to it.   ROV 07/06/13 --  follows up for COPD/+BD response, OSA.  He has done fairly well over the last year although he has noticed an increased WOB and wheeze since he started moving his house furniture to new home.  He has combivent and albuterol (old) available and has tried them w some relief. No longer on scheduled meds. No flares. He is also concerned that his CPAP pressures may not be adequate. Wants new company for CPAP > [employer]. Changing MD's to Dr Armstead Peaks.   ROV 08/29/13 -- Hx of COPD/+BD response, OSA. We changed his DME company, he is wearing CPAP reliably. We added dulera and stopped combivent. He still occassionally naps. Does not fall asleep unintentionally but he is tired when he wakes up in the am. He has an auto-set device, feels that he needs a higher ramp start pressure. He believes that the dulera was a good move.      Objective:   Physical Exam Filed Vitals:   08/29/13 0904  BP: 130/82  Pulse: 57  Height: 5' 6.6" (1.692 m)  Weight: 199 lb (90.266 kg)  SpO2: 96%    Gen: Pleasant, well-nourished, in no distress,  normal affect  ENT: No lesions,  mouth clear,  oropharynx clear, no postnasal drip  Neck: No JVD, no TMG, no carotid bruits  Lungs:  No use of accessory muscles, no dullness to percussion, clear without rales or rhonchi  Cardiovascular: RRR, heart sounds normal, no murmur or gallops, no peripheral edema  Musculoskeletal: No deformities, no cyanosis or clubbing  Neuro: alert, non focal  Skin: Warm, no lesions or rashes     Assessment & Plan:  Sleep apnea, obstructive - will raise the starting pressure to 10, see how he tolerates. If too strong then will lower it.   COPD (chronic obstructive pulmonary disease) - continue dulera - xopenex prn.

## 2013-08-29 NOTE — Assessment & Plan Note (Signed)
-   continue dulera - xopenex prn.

## 2013-08-30 ENCOUNTER — Encounter (HOSPITAL_COMMUNITY): Payer: Medicare Other

## 2013-08-31 ENCOUNTER — Encounter (HOSPITAL_COMMUNITY): Payer: Self-pay | Admitting: Cardiology

## 2013-09-08 ENCOUNTER — Ambulatory Visit: Payer: Medicare Other | Admitting: Cardiology

## 2013-09-08 ENCOUNTER — Ambulatory Visit (HOSPITAL_COMMUNITY): Payer: Medicare Other | Attending: Cardiology | Admitting: Cardiology

## 2013-09-08 DIAGNOSIS — R0989 Other specified symptoms and signs involving the circulatory and respiratory systems: Secondary | ICD-10-CM | POA: Diagnosis not present

## 2013-09-08 NOTE — Progress Notes (Signed)
Aortic duplex completed 

## 2013-09-14 DIAGNOSIS — G894 Chronic pain syndrome: Secondary | ICD-10-CM | POA: Diagnosis not present

## 2013-09-14 DIAGNOSIS — Z79899 Other long term (current) drug therapy: Secondary | ICD-10-CM | POA: Diagnosis not present

## 2013-09-14 DIAGNOSIS — M545 Low back pain, unspecified: Secondary | ICD-10-CM | POA: Diagnosis not present

## 2013-09-14 DIAGNOSIS — M47817 Spondylosis without myelopathy or radiculopathy, lumbosacral region: Secondary | ICD-10-CM | POA: Diagnosis not present

## 2013-10-09 DIAGNOSIS — M25569 Pain in unspecified knee: Secondary | ICD-10-CM | POA: Diagnosis not present

## 2013-10-09 DIAGNOSIS — M1710 Unilateral primary osteoarthritis, unspecified knee: Secondary | ICD-10-CM | POA: Diagnosis not present

## 2013-10-10 DIAGNOSIS — IMO0002 Reserved for concepts with insufficient information to code with codable children: Secondary | ICD-10-CM | POA: Diagnosis not present

## 2013-10-11 ENCOUNTER — Ambulatory Visit: Payer: Medicare Other | Admitting: Cardiology

## 2013-10-17 DIAGNOSIS — M25569 Pain in unspecified knee: Secondary | ICD-10-CM | POA: Diagnosis not present

## 2013-11-21 DIAGNOSIS — IMO0002 Reserved for concepts with insufficient information to code with codable children: Secondary | ICD-10-CM | POA: Diagnosis not present

## 2013-12-06 DIAGNOSIS — M25569 Pain in unspecified knee: Secondary | ICD-10-CM | POA: Diagnosis not present

## 2013-12-20 DIAGNOSIS — G4730 Sleep apnea, unspecified: Secondary | ICD-10-CM | POA: Diagnosis not present

## 2013-12-20 DIAGNOSIS — K921 Melena: Secondary | ICD-10-CM | POA: Diagnosis not present

## 2013-12-20 DIAGNOSIS — K648 Other hemorrhoids: Secondary | ICD-10-CM | POA: Diagnosis not present

## 2013-12-20 DIAGNOSIS — J449 Chronic obstructive pulmonary disease, unspecified: Secondary | ICD-10-CM | POA: Diagnosis not present

## 2013-12-20 DIAGNOSIS — I4891 Unspecified atrial fibrillation: Secondary | ICD-10-CM | POA: Diagnosis not present

## 2013-12-20 DIAGNOSIS — R197 Diarrhea, unspecified: Secondary | ICD-10-CM | POA: Diagnosis not present

## 2014-01-02 DIAGNOSIS — K648 Other hemorrhoids: Secondary | ICD-10-CM | POA: Diagnosis not present

## 2014-01-02 DIAGNOSIS — R197 Diarrhea, unspecified: Secondary | ICD-10-CM | POA: Diagnosis not present

## 2014-01-02 DIAGNOSIS — K921 Melena: Secondary | ICD-10-CM | POA: Diagnosis not present

## 2014-01-02 DIAGNOSIS — D126 Benign neoplasm of colon, unspecified: Secondary | ICD-10-CM | POA: Diagnosis not present

## 2014-01-02 DIAGNOSIS — K5730 Diverticulosis of large intestine without perforation or abscess without bleeding: Secondary | ICD-10-CM | POA: Diagnosis not present

## 2014-01-08 DIAGNOSIS — K219 Gastro-esophageal reflux disease without esophagitis: Secondary | ICD-10-CM | POA: Diagnosis not present

## 2014-01-08 DIAGNOSIS — I4891 Unspecified atrial fibrillation: Secondary | ICD-10-CM | POA: Diagnosis not present

## 2014-01-08 DIAGNOSIS — M109 Gout, unspecified: Secondary | ICD-10-CM | POA: Diagnosis not present

## 2014-01-08 DIAGNOSIS — E785 Hyperlipidemia, unspecified: Secondary | ICD-10-CM | POA: Diagnosis not present

## 2014-01-08 DIAGNOSIS — E039 Hypothyroidism, unspecified: Secondary | ICD-10-CM | POA: Diagnosis not present

## 2014-01-08 DIAGNOSIS — J449 Chronic obstructive pulmonary disease, unspecified: Secondary | ICD-10-CM | POA: Diagnosis not present

## 2014-01-08 DIAGNOSIS — R7301 Impaired fasting glucose: Secondary | ICD-10-CM | POA: Diagnosis not present

## 2014-01-08 DIAGNOSIS — M899 Disorder of bone, unspecified: Secondary | ICD-10-CM | POA: Diagnosis not present

## 2014-01-18 DIAGNOSIS — M47817 Spondylosis without myelopathy or radiculopathy, lumbosacral region: Secondary | ICD-10-CM | POA: Diagnosis not present

## 2014-01-22 DIAGNOSIS — H4011X0 Primary open-angle glaucoma, stage unspecified: Secondary | ICD-10-CM | POA: Diagnosis not present

## 2014-01-22 DIAGNOSIS — H35379 Puckering of macula, unspecified eye: Secondary | ICD-10-CM | POA: Diagnosis not present

## 2014-01-22 DIAGNOSIS — H409 Unspecified glaucoma: Secondary | ICD-10-CM | POA: Diagnosis not present

## 2014-01-23 DIAGNOSIS — G894 Chronic pain syndrome: Secondary | ICD-10-CM | POA: Diagnosis not present

## 2014-01-23 DIAGNOSIS — M545 Low back pain, unspecified: Secondary | ICD-10-CM | POA: Diagnosis not present

## 2014-01-23 DIAGNOSIS — M5380 Other specified dorsopathies, site unspecified: Secondary | ICD-10-CM | POA: Diagnosis not present

## 2014-01-23 DIAGNOSIS — M47817 Spondylosis without myelopathy or radiculopathy, lumbosacral region: Secondary | ICD-10-CM | POA: Diagnosis not present

## 2014-02-12 DIAGNOSIS — K921 Melena: Secondary | ICD-10-CM | POA: Diagnosis not present

## 2014-02-12 DIAGNOSIS — R143 Flatulence: Secondary | ICD-10-CM | POA: Diagnosis not present

## 2014-02-12 DIAGNOSIS — R141 Gas pain: Secondary | ICD-10-CM | POA: Diagnosis not present

## 2014-02-12 DIAGNOSIS — R197 Diarrhea, unspecified: Secondary | ICD-10-CM | POA: Diagnosis not present

## 2014-02-23 DIAGNOSIS — G894 Chronic pain syndrome: Secondary | ICD-10-CM | POA: Diagnosis not present

## 2014-02-23 DIAGNOSIS — M5137 Other intervertebral disc degeneration, lumbosacral region: Secondary | ICD-10-CM | POA: Diagnosis not present

## 2014-02-23 DIAGNOSIS — M4120 Other idiopathic scoliosis, site unspecified: Secondary | ICD-10-CM | POA: Diagnosis not present

## 2014-02-23 DIAGNOSIS — Z79899 Other long term (current) drug therapy: Secondary | ICD-10-CM | POA: Diagnosis not present

## 2014-03-05 DIAGNOSIS — G609 Hereditary and idiopathic neuropathy, unspecified: Secondary | ICD-10-CM | POA: Diagnosis not present

## 2014-03-13 DIAGNOSIS — H52229 Regular astigmatism, unspecified eye: Secondary | ICD-10-CM | POA: Diagnosis not present

## 2014-03-13 DIAGNOSIS — H4011X0 Primary open-angle glaucoma, stage unspecified: Secondary | ICD-10-CM | POA: Diagnosis not present

## 2014-03-13 DIAGNOSIS — H409 Unspecified glaucoma: Secondary | ICD-10-CM | POA: Diagnosis not present

## 2014-03-16 ENCOUNTER — Ambulatory Visit: Payer: Medicare Other | Admitting: Emergency Medicine

## 2014-03-16 ENCOUNTER — Ambulatory Visit (INDEPENDENT_AMBULATORY_CARE_PROVIDER_SITE_OTHER): Payer: Medicare Other | Admitting: Emergency Medicine

## 2014-03-16 ENCOUNTER — Encounter: Payer: Self-pay | Admitting: Emergency Medicine

## 2014-03-16 VITALS — BP 110/70 | HR 56 | Temp 97.1°F | Ht 67.0 in | Wt 202.6 lb

## 2014-03-16 DIAGNOSIS — J449 Chronic obstructive pulmonary disease, unspecified: Secondary | ICD-10-CM | POA: Diagnosis not present

## 2014-03-16 MED ORDER — LEVOFLOXACIN 500 MG PO TABS: ORAL_TABLET | ORAL | Status: DC

## 2014-03-16 NOTE — Assessment & Plan Note (Addendum)
Not clear to me that he has a bronchitis, could be allergic rhinitis. I would like for him to have abx available to take it it progresses. He is willing to trial stopping dulera to see if he misses. He will wait to get the high concentration Flu shot.   Please stop Dulera to see if your breathing changes. After a month, if you miss the medication then you can restart it Use your nasal saline and Nasonex as you have been doing Keep albuterol available to use 2 puffs up to every 4 hours if needed for shortness of breath Get the flu shot this Fall Fill your prescription for Levaquin if you develop worsening cough, a change in your mucous  amount or color , fevers, wheezing. Please call our office to let us know if you have to fill this medication.  Follow with Dr Lamonte Sakai in 3 months or sooner if you have any problems.

## 2014-03-16 NOTE — Patient Instructions (Signed)
Please stop Dulera to see if your breathing changes. After a month, if you miss the medication then you can restart it Use your nasal saline and Nasonex as you have been doing Keep albuterol available to use 2 puffs up to every 4 hours if needed for shortness of breath Get the flu shot this Fall Fill your prescription for Levaquin if you develop worsening cough, a change in your mucous  amount or color , fevers, wheezing. Please call our office to let us know if you have to fill this medication.  Follow with Dr Lamonte Sakai in 3 months or sooner if you have any problems.

## 2014-03-16 NOTE — Progress Notes (Signed)
Subjective:    Patient ID: Loc Feinstein, male    DOB: 1932/07/28, 80 y.o.   MRN: [id number] HPI 80 yo man, former smoker, hx of A Fib, COPD on nocturnal O2, GERD, also OSA not on CPAP for over 4 yrs. He has had PFT done in [place] Fall 2012 that supported COPD, walking oximetry that showed ? Hypoxemia, ONO with desats and started on . Began to have AE +/- PNA in 1/13 that never really got better. He was admitted to [facility] 2/4-07/21/11 for an AE-COPD associated with A fib + RVR.  Was rx steroids, abx, BD's. He is better but is limited to some degree, not back to prior baseline. He is low on energy. He is having trouble sleeping, difficulty falling asleep and staying asleep. He snores, no witnessed apnea. No real wheezing or coughing at this time.   ROV 10/07/11 -- 78yom hx of A Fib, COPD on nocturnal O2, GERD, also OSA not on CPAP for over 55yrs. Returns after PSG 09/17/11, shows AHI 27/hour. Also obtained his PFT from [place] from 11/12 - FEV1 71% predicted with positive BD response. Still having trouble w exertion, limited by arthritis more than breathing.Tolerating Symbicort, not really sure it is helping him.    ROV 12/04/11 -- 78yom hx of A Fib, COPD on nocturnal O2, GERD, OSA. We recently started back on CPAP, his download data 5/18-6/15 shows he wears it longer than 4 hours for 58% of the nights. He is working on increasing compliance. All barriers discussed, should be able to overcome. He has new mask that is better. He now has O2 bled in and has been wearing it all night.  Last time we stopped Symbicort - he hasn't missed it. Hasn't used the SABA.   ROV 06/20/12 -- 80 yo man, hx COPD/+BD response, OSA. He is getting his CPAP thru [place], his O2 thru [place]. Uses albuterol very rarely. He still has exertional SOB. He is trying to get new CPAP mask, the company hasn't gotten it to him yet. He hasn't been using CPAP for about two weeks - he is planning to go back to it.   ROV 07/06/13 --  follows up for COPD/+BD response, OSA.  He has done fairly well over the last year although he has noticed an increased WOB and wheeze since he started moving his house furniture to new home.  He has combivent and albuterol (old) available and has tried them w some relief. No longer on scheduled meds. No flares. He is also concerned that his CPAP pressures may not be adequate. Wants new company for CPAP > [employer]. Changing MD's to Dr Armstead Peaks.   ROV 08/29/13 -- Hx of COPD/+BD response, OSA. We changed his DME company, he is wearing CPAP reliably. We added dulera and stopped combivent. He still occassionally naps. Does not fall asleep unintentionally but he is tired when he wakes up in the am. He has an auto-set device, feels that he needs a higher ramp start pressure. He believes that the dulera was a good move.   ROV 03/16/14 -- Hx of COPD/+BD response, OSA. He has been doing fairly well. He is having some nasal congestion and productive cough for about a week. The mucous is thin yellow from his nose and from his chest. He is using nasonex.      Objective:   Physical Exam Filed Vitals:   03/16/14 1216  BP: 110/70  Pulse: 56  Temp: 97.1 F (36.2 C)  TempSrc: Oral  Height: 5\' 7"  (1.702 m)  Weight: 202 lb 9.6 oz (91.899 kg)  SpO2: 98%    Gen: Pleasant, well-nourished, in no distress,  normal affect  ENT: No lesions,  mouth clear,  oropharynx clear, no postnasal drip  Neck: No JVD, no TMG, no carotid bruits  Lungs: No use of accessory muscles, clear without rales or rhonchi  Cardiovascular: RRR, heart sounds normal, no murmur or gallops, no peripheral edema  Musculoskeletal: No deformities, no cyanosis or clubbing  Neuro: alert, non focal  Skin: Warm, no lesions or rashes     Assessment & Plan:  COPD (chronic obstructive pulmonary disease) Not clear to me that he has a bronchitis, could be allergic rhinitis. I would like for him to have abx available to take it it  progresses. He is willing to trial stopping dulera to see if he misses. He will wait to get the high concentration Flu shot.   Please stop Dulera to see if your breathing changes. After a month, if you miss the medication then you can restart it Use your nasal saline and Nasonex as you have been doing Keep albuterol available to use 2 puffs up to every 4 hours if needed for shortness of breath Get the flu shot this Fall Fill your prescription for Levaquin if you develop worsening cough, a change in your mucous  amount or color , fevers, wheezing. Please call our office to let us know if you have to fill this medication.  Follow with Dr Lamonte Sakai in 3 months or sooner if you have any problems.

## 2014-03-19 ENCOUNTER — Telehealth: Payer: Self-pay | Admitting: Emergency Medicine

## 2014-03-19 NOTE — Telephone Encounter (Signed)
No other recs except that he may benefit from some symptomatic relief - either tylenol cold&flu or another OTC formulation.

## 2014-03-19 NOTE — Telephone Encounter (Signed)
Called and spoke with Jonathan [name] and he stated that he filled the rx for the abx.  He stated that his sputum has changed in color as well.  He stated yesterday he was disoriented and unable to walk across the room, fell out of bed yesterday.  ?hallucinations last night as well.  Filled the rx yesterday afternoon and did start to feel better after taking the abx.   No problems with memory today.  Sputum is now brownish/orange in color.  Jonathan [name] wanted to call and make RB aware of situation yesterday and that today he is much better, any further recs?  Please advise. Thanks  No Known Allergies  Current Outpatient Prescriptions on File Prior to Visit  Medication Sig Dispense Refill  . acetaminophen (TYLENOL) 500 MG tablet Take 2 tablets (1,000 mg total) by mouth 2 (two) times daily.  360 tablet  1  . Calcium Lactate 750 MG TABS Take 1 tablet by mouth 2 (two) times daily.      . cyclobenzaprine (FLEXERIL) 10 MG tablet Take 10 mg by mouth daily as needed. 1/2 tablet weekly For muscle spasms      . diltiazem (CARDIZEM CD) 180 MG 24 hr capsule Take 1 capsule (180 mg total) by mouth daily.  90 capsule  3  . doxazosin (CARDURA) 8 MG tablet Take 1 tablet (8 mg total) by mouth daily. PATIENT NEEDS OFFICE VISIT FOR ADDITIONAL REFILLS  30 tablet  0  . DULoxetine (CYMBALTA) 30 MG capsule Take 30 mg by mouth daily.      . fluticasone (FLONASE) 50 MCG/ACT nasal spray Place 2 sprays into the nose 2 (two) times daily.      Marland Kitchen latanoprost (XALATAN) 0.005 % ophthalmic solution Place 1 drop into both eyes at bedtime.  7.5 mL  3  . levalbuterol (XOPENEX HFA) 45 MCG/ACT inhaler Inhale 1-2 puffs into the lungs every 4 (four) hours as needed for wheezing.  1 Inhaler  12  . levofloxacin (LEVAQUIN) 500 MG tablet One tablet, once daily for 7 days.  7 tablet  0  . levothyroxine (SYNTHROID, LEVOTHROID) 75 MCG tablet Take 1 tablet (75 mcg total) by mouth daily before breakfast.  90 tablet  3  . mometasone-formoterol (DULERA) 200-5 MCG/ACT AERO  Inhale 2 puffs into the lungs 2 (two) times daily.  1 Inhaler  11  . Multiple Vitamin (MULTIVITAMIN) tablet Take 1 tablet by mouth daily.      Marland Kitchen oxyCODONE (ROXICODONE) 5 MG immediate release tablet Take 1-2 tablets (5-10 mg total) by mouth every 4 (four) hours as needed for pain.  50 tablet  0  . OXYCODONE HCL PO Take 10 mg by mouth as needed.      . pregabalin (LYRICA) 100 MG capsule 100 mg. Take one every morning and 2 every evening for peripheral neuropathy      . sertraline (ZOLOFT) 100 MG tablet Take 100 mg by mouth daily.      . tadalafil (CIALIS) 20 MG tablet Take 20 mg by mouth daily as needed. For erectile dysfunction      . zaleplon (SONATA) 10 MG capsule Take 1 capsule (10 mg total) by mouth at bedtime as needed.  30 capsule  3  . zolpidem (AMBIEN) 10 MG tablet Take 0.5 tablets (5 mg total) by mouth at bedtime as needed. For sleep  30 tablet  3  . [DISCONTINUED] calcium-vitamin D (OSCAL WITH D) 500-200 MG-UNIT per tablet Take 1 tablet by mouth daily.      . [  DISCONTINUED] diphenhydrAMINE (SOMINEX) 25 MG tablet Take 50 mg by mouth at bedtime as needed. For sleep      . [DISCONTINUED] mometasone (NASONEX) 50 MCG/ACT nasal spray Place 2 sprays into the nose daily.       No current facility-administered medications on file prior to visit.

## 2014-03-19 NOTE — Telephone Encounter (Signed)
Called pt. Made aware of recs. Nothing further needed

## 2014-06-11 DIAGNOSIS — G894 Chronic pain syndrome: Secondary | ICD-10-CM | POA: Diagnosis not present

## 2014-06-11 DIAGNOSIS — M47814 Spondylosis without myelopathy or radiculopathy, thoracic region: Secondary | ICD-10-CM | POA: Diagnosis not present

## 2014-06-11 DIAGNOSIS — M47817 Spondylosis without myelopathy or radiculopathy, lumbosacral region: Secondary | ICD-10-CM | POA: Diagnosis not present

## 2014-06-11 DIAGNOSIS — M5134 Other intervertebral disc degeneration, thoracic region: Secondary | ICD-10-CM | POA: Diagnosis not present

## 2014-06-11 DIAGNOSIS — M5137 Other intervertebral disc degeneration, lumbosacral region: Secondary | ICD-10-CM | POA: Diagnosis not present

## 2014-06-11 DIAGNOSIS — M546 Pain in thoracic spine: Secondary | ICD-10-CM | POA: Diagnosis not present

## 2014-06-11 DIAGNOSIS — Z79899 Other long term (current) drug therapy: Secondary | ICD-10-CM | POA: Diagnosis not present

## 2014-06-11 DIAGNOSIS — M1990 Unspecified osteoarthritis, unspecified site: Secondary | ICD-10-CM | POA: Diagnosis not present

## 2014-06-12 ENCOUNTER — Other Ambulatory Visit: Payer: Self-pay | Admitting: Physician Assistant

## 2014-06-12 ENCOUNTER — Ambulatory Visit
Admission: RE | Admit: 2014-06-12 | Discharge: 2014-06-12 | Disposition: A | Discharge status: Home / Self Care | Payer: Medicare Other | Source: Ambulatory Visit | Attending: Physician Assistant | Admitting: Physician Assistant

## 2014-06-12 DIAGNOSIS — M5136 Other intervertebral disc degeneration, lumbar region: Secondary | ICD-10-CM | POA: Diagnosis not present

## 2014-06-12 DIAGNOSIS — M546 Pain in thoracic spine: Secondary | ICD-10-CM

## 2014-06-12 DIAGNOSIS — M545 Low back pain: Secondary | ICD-10-CM

## 2014-06-12 DIAGNOSIS — S32040A Wedge compression fracture of fourth lumbar vertebra, initial encounter for closed fracture: Secondary | ICD-10-CM | POA: Diagnosis not present

## 2014-06-12 DIAGNOSIS — S22060A Wedge compression fracture of T7-T8 vertebra, initial encounter for closed fracture: Secondary | ICD-10-CM | POA: Diagnosis not present

## 2014-06-14 DIAGNOSIS — B349 Viral infection, unspecified: Secondary | ICD-10-CM | POA: Diagnosis not present

## 2014-06-19 DIAGNOSIS — M25562 Pain in left knee: Secondary | ICD-10-CM | POA: Diagnosis not present

## 2014-06-19 DIAGNOSIS — M1712 Unilateral primary osteoarthritis, left knee: Secondary | ICD-10-CM | POA: Diagnosis not present

## 2014-07-09 DIAGNOSIS — G894 Chronic pain syndrome: Secondary | ICD-10-CM | POA: Diagnosis not present

## 2014-07-09 DIAGNOSIS — M546 Pain in thoracic spine: Secondary | ICD-10-CM | POA: Diagnosis not present

## 2014-07-09 DIAGNOSIS — Z79899 Other long term (current) drug therapy: Secondary | ICD-10-CM | POA: Diagnosis not present

## 2014-07-09 DIAGNOSIS — M4697 Unspecified inflammatory spondylopathy, lumbosacral region: Secondary | ICD-10-CM | POA: Diagnosis not present

## 2014-07-12 ENCOUNTER — Telehealth: Payer: Self-pay | Admitting: Cardiology

## 2014-07-12 NOTE — Telephone Encounter (Signed)
Mr.Cozine is calling because on last night his heart was fluctuating between 50-150 . Please call  Thanks

## 2014-07-12 NOTE — Telephone Encounter (Signed)
Continue present meds Jonathan [name]

## 2014-07-12 NOTE — Telephone Encounter (Signed)
Spoke with pt, he had an episode of what he feels was atrial fib last night. He did not sleep well but otherwise feels fine today and feels he is back in regular rhythm with pulse 60. He cont on the asa and diltiazem. Explained to patient not really anything to do at this point but will make dr Stanford Breed aware. Pt agreed with this plan.

## 2014-07-12 NOTE — Telephone Encounter (Signed)
Spoke with pt, Aware of dr crenshaw's recommendations.  °

## 2014-07-13 DIAGNOSIS — H4011X1 Primary open-angle glaucoma, mild stage: Secondary | ICD-10-CM | POA: Diagnosis not present

## 2014-07-13 DIAGNOSIS — H401230 Low-tension glaucoma, bilateral, stage unspecified: Secondary | ICD-10-CM | POA: Diagnosis not present

## 2014-07-17 DIAGNOSIS — H4011X1 Primary open-angle glaucoma, mild stage: Secondary | ICD-10-CM | POA: Diagnosis not present

## 2014-07-17 DIAGNOSIS — H04123 Dry eye syndrome of bilateral lacrimal glands: Secondary | ICD-10-CM | POA: Diagnosis not present

## 2014-07-26 DIAGNOSIS — D1801 Hemangioma of skin and subcutaneous tissue: Secondary | ICD-10-CM | POA: Diagnosis not present

## 2014-07-26 DIAGNOSIS — M1000 Idiopathic gout, unspecified site: Secondary | ICD-10-CM | POA: Diagnosis not present

## 2014-07-26 DIAGNOSIS — I480 Paroxysmal atrial fibrillation: Secondary | ICD-10-CM | POA: Diagnosis not present

## 2014-07-26 DIAGNOSIS — D692 Other nonthrombocytopenic purpura: Secondary | ICD-10-CM | POA: Diagnosis not present

## 2014-07-26 DIAGNOSIS — L814 Other melanin hyperpigmentation: Secondary | ICD-10-CM | POA: Diagnosis not present

## 2014-07-26 DIAGNOSIS — L570 Actinic keratosis: Secondary | ICD-10-CM | POA: Diagnosis not present

## 2014-07-26 DIAGNOSIS — L821 Other seborrheic keratosis: Secondary | ICD-10-CM | POA: Diagnosis not present

## 2014-07-26 DIAGNOSIS — Z23 Encounter for immunization: Secondary | ICD-10-CM | POA: Diagnosis not present

## 2014-07-26 DIAGNOSIS — M810 Age-related osteoporosis without current pathological fracture: Secondary | ICD-10-CM | POA: Diagnosis not present

## 2014-07-26 DIAGNOSIS — E785 Hyperlipidemia, unspecified: Secondary | ICD-10-CM | POA: Diagnosis not present

## 2014-07-26 DIAGNOSIS — Z1389 Encounter for screening for other disorder: Secondary | ICD-10-CM | POA: Diagnosis not present

## 2014-07-26 DIAGNOSIS — Z125 Encounter for screening for malignant neoplasm of prostate: Secondary | ICD-10-CM | POA: Diagnosis not present

## 2014-07-26 DIAGNOSIS — Z0000 Encounter for general adult medical examination without abnormal findings: Secondary | ICD-10-CM | POA: Diagnosis not present

## 2014-07-26 DIAGNOSIS — R739 Hyperglycemia, unspecified: Secondary | ICD-10-CM | POA: Diagnosis not present

## 2014-07-31 DIAGNOSIS — H4011X1 Primary open-angle glaucoma, mild stage: Secondary | ICD-10-CM | POA: Diagnosis not present

## 2014-07-31 DIAGNOSIS — H04123 Dry eye syndrome of bilateral lacrimal glands: Secondary | ICD-10-CM | POA: Diagnosis not present

## 2014-07-31 DIAGNOSIS — H524 Presbyopia: Secondary | ICD-10-CM | POA: Diagnosis not present

## 2014-08-02 DIAGNOSIS — E785 Hyperlipidemia, unspecified: Secondary | ICD-10-CM | POA: Diagnosis not present

## 2014-08-02 DIAGNOSIS — I480 Paroxysmal atrial fibrillation: Secondary | ICD-10-CM | POA: Diagnosis not present

## 2014-08-02 DIAGNOSIS — R739 Hyperglycemia, unspecified: Secondary | ICD-10-CM | POA: Diagnosis not present

## 2014-08-02 DIAGNOSIS — M1000 Idiopathic gout, unspecified site: Secondary | ICD-10-CM | POA: Diagnosis not present

## 2014-08-17 ENCOUNTER — Ambulatory Visit (INDEPENDENT_AMBULATORY_CARE_PROVIDER_SITE_OTHER): Payer: Medicare Other | Admitting: Emergency Medicine

## 2014-08-17 ENCOUNTER — Encounter: Payer: Self-pay | Admitting: Emergency Medicine

## 2014-08-17 VITALS — BP 134/70 | HR 60 | Ht 67.0 in | Wt 199.0 lb

## 2014-08-17 DIAGNOSIS — J449 Chronic obstructive pulmonary disease, unspecified: Secondary | ICD-10-CM

## 2014-08-17 DIAGNOSIS — G4733 Obstructive sleep apnea (adult) (pediatric): Secondary | ICD-10-CM | POA: Diagnosis not present

## 2014-08-17 NOTE — Addendum Note (Signed)
Addended by: Desmond Dike C on: 08/17/2014 04:20 PM   Modules accepted: Orders

## 2014-08-17 NOTE — Progress Notes (Signed)
Subjective:    Patient ID: Jonathan [name], male    DOB: [birth year], 81 y.o.   MRN: [id number] HPI 81 yo man, former smoker, hx of A Fib, COPD on nocturnal O2, GERD, also OSA not on CPAP for over 4 yrs. He has had PFT done in [place] Fall 2012 that supported COPD, walking oximetry that showed ? Hypoxemia, ONO with desats and started on . Began to have AE +/- PNA in 1/13 that never really got better. He was admitted to [facility] 2/4-07/21/11 for an AE-COPD associated with A fib + RVR.  Was rx steroids, abx, BD's. He is better but is limited to some degree, not back to prior baseline. He is low on energy. He is having trouble sleeping, difficulty falling asleep and staying asleep. He snores, no witnessed apnea. No real wheezing or coughing at this time.   ROV 10/07/11 -- 78yom hx of A Fib, COPD on nocturnal O2, GERD, also OSA not on CPAP for over 23yrs. Returns after PSG 09/17/11, shows AHI 27/hour. Also obtained his PFT from [facility] from 11/12 - FEV1 71% predicted with positive BD response. Still having trouble w exertion, limited by arthritis more than breathing.Tolerating Symbicort, not really sure it is helping him.    ROV 12/04/11 -- 78yom hx of A Fib, COPD on nocturnal O2, GERD, OSA. We recently started back on CPAP, his download data 5/18-6/15 shows he wears it longer than 4 hours for 58% of the nights. He is working on increasing compliance. All barriers discussed, should be able to overcome. He has new mask that is better. He now has O2 bled in and has been wearing it all night.  Last time we stopped Symbicort - he hasn't missed it. Hasn't used the SABA.   ROV 06/20/12 -- 81 yo man, hx COPD/+BD response, OSA. He is getting his CPAP thru [facility], his O2 thru [facility]. Uses albuterol very rarely. He still has exertional SOB. He is trying to get new CPAP mask, the company hasn't gotten it to him yet. He hasn't been using CPAP for about two weeks - he is planning to go back to it.   ROV 07/06/13 --  follows up for COPD/+BD response, OSA.  He has done fairly well over the last year although he has noticed an increased WOB and wheeze since he started moving his house furniture to new home.  He has combivent and albuterol (old) available and has tried them w some relief. No longer on scheduled meds. No flares. He is also concerned that his CPAP pressures may not be adequate. Wants new company for CPAP > [employer]. Changing MD's to Dr Armstead Peaks.   ROV 08/29/13 -- Hx of COPD/+BD response, OSA. We changed his DME company, he is wearing CPAP reliably. We added dulera and stopped combivent. He still occassionally naps. Does not fall asleep unintentionally but he is tired when he wakes up in the am. He has an auto-set device, feels that he needs a higher ramp start pressure. He believes that the dulera was a good move.   ROV 03/16/14 -- Hx of COPD/+BD response, OSA. He has been doing fairly well. He is having some nasal congestion and productive cough for about a week. The mucous is thin yellow from his nose and from his chest. He is using nasonex.       ROV 08/17/14 -- follow up visit for COPD, OSA, rhinitis. He reports that he has been needing a nap only a few  hours after he gets up in the am, also wants to sleep in the afternoon. He had [employer] check the device and it is working fine. He wears the CPAP reliably. His wt is down about 15 lbs. He is on dulera, feels that his breathing is not limiting him. He tried stopping it - wasn't sure whether he lost ground but his wife thought he did so he restarted. He notes that he can't exercise but this is due to pain.      Objective:   Physical Exam Filed Vitals:   08/17/14 1544 08/17/14 1545  BP:  134/70  Pulse:  60  Height: 5\' 7"  (1.702 m)   Weight: 199 lb (90.266 kg)   SpO2:  95%    Gen: Pleasant, well-nourished, in no distress,  normal affect  ENT: No lesions,  mouth clear,  oropharynx clear, no postnasal drip  Neck: No JVD, no TMG, no carotid  bruits  Lungs: No use of accessory muscles, clear without rales or rhonchi  Cardiovascular: RRR, heart sounds normal, no murmur or gallops, no peripheral edema  Musculoskeletal: No deformities, no cyanosis or clubbing  Neuro: alert, non focal  Skin: Warm, no lesions or rashes     Assessment & Plan:  COPD (chronic obstructive pulmonary disease) Continue his current bronchodilators for now. He is interested in possibly stopping Dulera at some point in the future. We can revisit   Sleep apnea, obstructive He needs a home CPAP titration study and then adjustment of his maintenance pressures versus starting an auto titration device

## 2014-08-17 NOTE — Assessment & Plan Note (Signed)
He needs a home CPAP titration study and then adjustment of his maintenance pressures versus starting an auto titration device

## 2014-08-17 NOTE — Patient Instructions (Signed)
We will perform a pressure titration study through [facility] Based on the study results we will decide whether your pressures need to be changed or whether to start an auto titration device Please continue your Dulera twice a day for now. We can revisit stopping this medication at some point in the future Follow with Dr Lamonte Sakai in 3 months or sooner if you have any problems.

## 2014-08-17 NOTE — Assessment & Plan Note (Signed)
Continue his current bronchodilators for now. He is interested in possibly stopping Dulera at some point in the future. We can revisit

## 2014-08-21 DIAGNOSIS — H43811 Vitreous degeneration, right eye: Secondary | ICD-10-CM | POA: Diagnosis not present

## 2014-08-21 DIAGNOSIS — H35373 Puckering of macula, bilateral: Secondary | ICD-10-CM | POA: Diagnosis not present

## 2014-08-21 DIAGNOSIS — H4011X1 Primary open-angle glaucoma, mild stage: Secondary | ICD-10-CM | POA: Diagnosis not present

## 2014-08-22 ENCOUNTER — Other Ambulatory Visit: Payer: Self-pay | Admitting: Emergency Medicine

## 2014-10-10 DIAGNOSIS — M25562 Pain in left knee: Secondary | ICD-10-CM | POA: Diagnosis not present

## 2014-10-10 DIAGNOSIS — M1712 Unilateral primary osteoarthritis, left knee: Secondary | ICD-10-CM | POA: Diagnosis not present

## 2014-10-24 DIAGNOSIS — I480 Paroxysmal atrial fibrillation: Secondary | ICD-10-CM | POA: Diagnosis not present

## 2014-10-24 DIAGNOSIS — M810 Age-related osteoporosis without current pathological fracture: Secondary | ICD-10-CM | POA: Diagnosis not present

## 2014-10-24 DIAGNOSIS — M1000 Idiopathic gout, unspecified site: Secondary | ICD-10-CM | POA: Diagnosis not present

## 2014-10-24 DIAGNOSIS — E785 Hyperlipidemia, unspecified: Secondary | ICD-10-CM | POA: Diagnosis not present

## 2014-10-24 DIAGNOSIS — R739 Hyperglycemia, unspecified: Secondary | ICD-10-CM | POA: Diagnosis not present

## 2014-10-31 DIAGNOSIS — R739 Hyperglycemia, unspecified: Secondary | ICD-10-CM | POA: Diagnosis not present

## 2014-10-31 DIAGNOSIS — R079 Chest pain, unspecified: Secondary | ICD-10-CM | POA: Diagnosis not present

## 2014-10-31 DIAGNOSIS — I480 Paroxysmal atrial fibrillation: Secondary | ICD-10-CM | POA: Diagnosis not present

## 2014-10-31 DIAGNOSIS — M810 Age-related osteoporosis without current pathological fracture: Secondary | ICD-10-CM | POA: Diagnosis not present

## 2014-10-31 DIAGNOSIS — E785 Hyperlipidemia, unspecified: Secondary | ICD-10-CM | POA: Diagnosis not present

## 2014-11-01 ENCOUNTER — Telehealth: Payer: Self-pay | Admitting: Cardiology

## 2014-11-01 NOTE — Telephone Encounter (Signed)
Received records from [facility] for appointment on 12/13/14 with Kerin Ransom, [place].  Records given to [employer] (medical records) for Luke's schedule on 12/13/14.  lp

## 2014-11-02 DIAGNOSIS — M545 Low back pain: Secondary | ICD-10-CM | POA: Diagnosis not present

## 2014-11-20 DIAGNOSIS — M545 Low back pain: Secondary | ICD-10-CM | POA: Diagnosis not present

## 2014-11-20 DIAGNOSIS — G894 Chronic pain syndrome: Secondary | ICD-10-CM | POA: Diagnosis not present

## 2014-12-13 ENCOUNTER — Encounter: Payer: Self-pay | Admitting: Physician Assistant

## 2014-12-13 ENCOUNTER — Ambulatory Visit (INDEPENDENT_AMBULATORY_CARE_PROVIDER_SITE_OTHER): Payer: Medicare Other | Admitting: Physician Assistant

## 2014-12-13 VITALS — BP 138/78 | HR 56 | Ht 67.0 in | Wt 206.5 lb

## 2014-12-13 DIAGNOSIS — I480 Paroxysmal atrial fibrillation: Secondary | ICD-10-CM

## 2014-12-13 DIAGNOSIS — R079 Chest pain, unspecified: Secondary | ICD-10-CM

## 2014-12-13 MED ORDER — NITROGLYCERIN 0.4 MG SL SUBL
0.4000 mg | SUBLINGUAL_TABLET | SUBLINGUAL | Status: DC | PRN
Start: 2014-12-13 — End: 2021-07-31

## 2014-12-13 NOTE — Patient Instructions (Signed)
Your physician has requested that you have en exercise stress myoview. For further information please visit [email or web address]. Please follow instruction sheet, as given.  Your physician recommends that you schedule a follow-up appointment in: Dr. Stanford Breed for test results.

## 2014-12-13 NOTE — Progress Notes (Signed)
Cardiology Office Note   Date:  12/13/2014   ID:  Jonathan [name], DOB 1932/07/15, MRN [id number]  PCP:  Thressa Sheller, MD  Cardiologist:   Dr. Alcide Evener, PA-C   Chief Complaint  Patient presents with  . Follow-up    c/o pain in chest area, swelling in legs ankles and feet , a little SOB no dizziness    History of Present Illness: Jonathan [name] is a 81 y.o. male with a history of  OSA on CPAP, COPD, hypertension, PAF, hypothyroidism.  Because of falls, he was taken off Xarelto and was put on aspirin 325 mg daily in March 2015. He is here today for follow-up.  Jonathan [name] presents for follow-up.  He is doing better about falling. He has not had any palpitations. He does not get chest pain. He feels his shortness of breath is at baseline and he follows with Dr. Lamonte Sakai for this. He was having problems with bruising on his arms and decreased the aspirin from 325 mg daily down to 81 mg 2 daily.   He has had a couple of episodes of chest pain that were not exertional. They will pre- and not associated with shortness of breath, nausea, vomiting or diaphoresis. He does not recall any specific event associated with them. The chest pain resolved without any intervention or specific medications. It was not positional and he does not believe it was pleuritic. It was a 5/10 or less.   The other issue he mentions today he is feeling a time somnolence. He is compliant with his C Pap and uses oxygen with it. It was titrated 3 months ago after he last saw Dr. Lamonte Sakai. He does not feel rested when he wakes and he has to stop what he is doing during the day at times and take naps. He has not  Fallen asleep while driving and he does not feel the somnolence is causing any falls.   His shortness of breath limits his activity , and he feels he may be a bit more limited than usual, but does not recall any sudden change. He gets daytime edema but does not wake with it.   Past Medical  History  Diagnosis Date  . Bronchitis   . Hypertension   . Gout   . BPH (benign prostatic hyperplasia)   . Spinal compression fracture seventh vertebre  . COPD (chronic obstructive pulmonary disease)     2 liters O2 HS  . Hypoxia   . Fibromyalgia   . Peripheral neuropathy   . Osteoarthritis   . GERD (gastroesophageal reflux disease)   . Hypothyroidism   . Insomnia   . Rotator cuff tear, left   . Glaucoma     bilateral eyes  . Fatty tumor     waste and back  . Atrial fibrillation   . Depression   . Cataract   . Wears glasses   . On home oxygen therapy     at night with cpap  . Sleep apnea     uses cpap-add oxygen at night    Past Surgical History  Procedure Laterality Date  . Tonsil    . Appendectomy  age 29  . Nasal concha bullosa resection  age 28  . Vasectomy  age 54  . Inguinal hernia repair  age 88    rt side  . Cholecystectomy  age 40  . Cataract extraction      bilateral  . Eye surgery    . Prostate  surgery    . Shoulder arthroscopy w/ rotator cuff repair Right     early 2000s  . Carpal tunnel release Right     early 2000s  . Foot arthrodesis Right 02/02/2013    Procedure: RIGHT HALLUX METATARSAL PHALANGEAL JOINT ARTHRODESIS ;  Surgeon: Wylene Simmer, MD;  Location: [facility];  Service: Orthopedics;  Laterality: Right;    Current Outpatient Prescriptions  Medication Sig Dispense Refill  . acetaminophen (TYLENOL) 500 MG tablet Take 2 tablets (1,000 mg total) by mouth 2 (two) times daily. 360 tablet 1  . allopurinol (ZYLOPRIM) 300 MG tablet Take 1 tablet by mouth daily.    Marland Kitchen aspirin 81 MG tablet Take 81 mg by mouth daily.    . brimonidine (ALPHAGAN) 0.15 % ophthalmic solution Place 1 drop into the left eye 2 (two) times daily.    . Calcium Lactate 750 MG TABS Take 1 tablet by mouth 2 (two) times daily.    Marland Kitchen diltiazem (CARDIZEM CD) 180 MG 24 hr capsule Take 1 capsule (180 mg total) by mouth daily. 90 capsule 3  . doxazosin (CARDURA) 8 MG  tablet Take 1 tablet (8 mg total) by mouth daily. PATIENT NEEDS OFFICE VISIT FOR ADDITIONAL REFILLS 30 tablet 0  . DULERA 200-5 MCG/ACT AERO INHALE 2 PUFFS INTO THE LUNGS 2 (TWO) TIMES DAILY. 13 g 5  . DULoxetine (CYMBALTA) 30 MG capsule Take 60 mg by mouth daily.     . fluticasone (FLONASE) 50 MCG/ACT nasal spray Place 2 sprays into the nose 2 (two) times daily as needed.     . latanoprost (XALATAN) 0.005 % ophthalmic solution Place 1 drop into both eyes at bedtime. 7.5 mL 3  . levalbuterol (XOPENEX HFA) 45 MCG/ACT inhaler Inhale 1-2 puffs into the lungs every 4 (four) hours as needed for wheezing. 1 Inhaler 12  . levothyroxine (SYNTHROID, LEVOTHROID) 75 MCG tablet Take 1 tablet (75 mcg total) by mouth daily before breakfast. 90 tablet 3  . mometasone (NASONEX) 50 MCG/ACT nasal spray Place 2 sprays into the nose daily.    . Multiple Vitamin (MULTIVITAMIN) tablet Take 1 tablet by mouth daily.    . OXYCODONE HCL PO Take 10 mg by mouth as needed.    . pregabalin (LYRICA) 100 MG capsule 100 mg. Take  2 every evening for peripheral neuropathy    . tadalafil (CIALIS) 20 MG tablet Take 20 mg by mouth daily as needed. For erectile dysfunction    . zaleplon (SONATA) 10 MG capsule Take 10 mg by mouth at bedtime as needed for sleep.    . nitroGLYCERIN (NITROSTAT) 0.4 MG SL tablet Place 1 tablet (0.4 mg total) under the tongue every 5 (five) minutes as needed for chest pain. 90 tablet 3  . [DISCONTINUED] calcium-vitamin D (OSCAL WITH D) 500-200 MG-UNIT per tablet Take 1 tablet by mouth daily.    . [DISCONTINUED] diphenhydrAMINE (SOMINEX) 25 MG tablet Take 50 mg by mouth at bedtime as needed. For sleep     No current facility-administered medications for this visit.    Allergies:   Review of patient's allergies indicates no known allergies.    Social History:  The patient  reports that he quit smoking about 39 years ago. His smoking use included Cigarettes. He has a 20 pack-year smoking history. He has  never used smokeless tobacco. He reports that he drinks alcohol. He reports that he does not use illicit drugs.   Family History:  The patient's family history includes Arthritis in his  mother; Coronary artery disease in his brother; Heart disease in his father; Kidney cancer in his father; Lung cancer in his father and mother; Prostate cancer in his father.    ROS:  Please see the history of present illness. All other systems are reviewed and negative.    PHYSICAL EXAM: VS:  BP 138/78 mmHg  Pulse 56  Ht 5\' 7"  (1.702 m)  Wt 206 lb 8 oz (93.668 kg)  BMI 32.33 kg/m2 , BMI Body mass index is 32.33 kg/(m^2). GEN: Well nourished, well developed, in no acute distress HEENT: normal Neck: no JVD, carotid bruits, or masses Cardiac: RRR; no murmurs, rubs, or gallops,no edema. Distal pulses 2+ in all 4 extremities Respiratory:   A few basilar rales bilaterally, normal work of breathing , good air exchange GI: soft, nontender, nondistended, + BS MS: no deformity or atrophy Skin: warm and dry, no rash Neuro:  Strength and sensation are intact Psych: euthymic mood, full affect   EKG:  EKG is ordered today. The ekg ordered today demonstrates  Sinus rhythm with Q waves in leads V1-V3. He has late transition which is slightly different from his previous ECG dated March 2015   Recent Labs: No results found for requested labs within last 365 days.    Lipid Panel    Component Value Date/Time   CHOL 212* 11/23/2012 0933   TRIG 146 11/23/2012 0933   HDL 50 11/23/2012 0933   CHOLHDL 4.2 11/23/2012 0933   VLDL 29 11/23/2012 0933   LDLCALC 133* 11/23/2012 0933     Wt Readings from Last 3 Encounters:  12/13/14 206 lb 8 oz (93.668 kg)  08/17/14 199 lb (90.266 kg)  03/16/14 202 lb 9.6 oz (91.899 kg)     Other studies Reviewed: Additional studies/ records that were reviewed today include:  Previous ECGs and office notes.  ASSESSMENT AND PLAN:  1.   Chest pain, moderate CAD risk : he get a  Lexi scan Myoview.  He declined a prescription for sublingual nitroglycerin.  Continue aspirin at his current dosing.   2. PAF : he has not had any palpitations and does not believe he has had any recent episodes of atrial fibrillation. Continue current medications.   Current medicines are reviewed at length with the patient today.  The patient does not have concerns regarding medicines.  The following changes have been made:  no change  Labs/ tests ordered today include:  Orders Placed This Encounter  Procedures  . Myocardial Perfusion Imaging  . EKG 12-Lead     Disposition:   FU with  Dr. Stanford Breed after stress test.  Lipid profile and LFTs plus adding a statin if his stress test is positive.   Augusto Garbe  12/13/2014 5:40 PM    [facility] [address], [place], [place]  [address] Phone: [phone]; Fax: [phone]

## 2014-12-25 ENCOUNTER — Telehealth (HOSPITAL_COMMUNITY): Payer: Self-pay

## 2014-12-25 NOTE — Telephone Encounter (Signed)
Encounter complete. 

## 2014-12-26 NOTE — Telephone Encounter (Signed)
Encounter complete. 

## 2014-12-27 ENCOUNTER — Ambulatory Visit (HOSPITAL_COMMUNITY)
Admission: RE | Admit: 2014-12-27 | Discharge: 2014-12-27 | Disposition: A | Discharge status: Home / Self Care | Payer: Medicare Other | Source: Ambulatory Visit | Attending: Cardiology | Admitting: Cardiology

## 2014-12-27 DIAGNOSIS — R079 Chest pain, unspecified: Secondary | ICD-10-CM | POA: Diagnosis not present

## 2014-12-27 MED ORDER — TECHNETIUM TC 99M SESTAMIBI GENERIC - CARDIOLITE
10.8000 | Freq: Once | INTRAVENOUS | Status: AC | PRN
  Administered 2014-12-27: 11 via INTRAVENOUS

## 2014-12-27 MED ORDER — TECHNETIUM TC 99M SESTAMIBI GENERIC - CARDIOLITE
30.9000 | Freq: Once | INTRAVENOUS | Status: AC | PRN
  Administered 2014-12-27: 30.9 via INTRAVENOUS

## 2014-12-27 MED ORDER — REGADENOSON 0.4 MG/5ML IV SOLN
0.4000 mg | Freq: Once | INTRAVENOUS | Status: AC
  Administered 2014-12-27: 0.4 mg via INTRAVENOUS

## 2014-12-28 LAB — MYOCARDIAL PERFUSION IMAGING
LV dias vol: 99 mL
LV sys vol: 43 mL
Peak HR: 76 {beats}/min
Rest HR: 60 {beats}/min
SDS: 2
SRS: 0
SSS: 2
TID: 1.25

## 2015-01-04 DIAGNOSIS — H4011X1 Primary open-angle glaucoma, mild stage: Secondary | ICD-10-CM | POA: Diagnosis not present

## 2015-01-04 DIAGNOSIS — H43813 Vitreous degeneration, bilateral: Secondary | ICD-10-CM | POA: Diagnosis not present

## 2015-01-04 DIAGNOSIS — H35373 Puckering of macula, bilateral: Secondary | ICD-10-CM | POA: Diagnosis not present

## 2015-01-07 NOTE — Progress Notes (Signed)
HPI: FU atrial fibrillation. Anticoagulation discontinued previously because of frequent falls. Previously with a fib but converted spontaneously to sinus rhythm. Note the patient had an echocardiogram in [place] in November of 2012 that showed an ejection fraction of 59% with no valvular abnormalities. Patient also with significant COPD. Abdominal ultrasound March 2015 showed no aneurysm. Nuclear study July 2016 showed ejection fraction 56% and normal perfusion. Since I last saw him he denies dyspnea, recurrent chest pain or syncope.  Current Outpatient Prescriptions  Medication Sig Dispense Refill  . acetaminophen (TYLENOL) 500 MG tablet Take 2 tablets (1,000 mg total) by mouth 2 (two) times daily. 360 tablet 1  . allopurinol (ZYLOPRIM) 300 MG tablet Take 1 tablet by mouth daily.    Marland Kitchen aspirin 81 MG tablet Take 81 mg by mouth daily.    . brimonidine (ALPHAGAN) 0.15 % ophthalmic solution Place 1 drop into the left eye 2 (two) times daily.    . Calcium Lactate 750 MG TABS Take 1 tablet by mouth 2 (two) times daily.    Marland Kitchen diltiazem (CARDIZEM CD) 180 MG 24 hr capsule Take 1 capsule (180 mg total) by mouth daily. 90 capsule 3  . doxazosin (CARDURA) 8 MG tablet Take 1 tablet (8 mg total) by mouth daily. PATIENT NEEDS OFFICE VISIT FOR ADDITIONAL REFILLS 30 tablet 0  . DULERA 200-5 MCG/ACT AERO INHALE 2 PUFFS INTO THE LUNGS 2 (TWO) TIMES DAILY. 13 g 5  . DULoxetine (CYMBALTA) 30 MG capsule Take 60 mg by mouth daily.     . fluticasone (FLONASE) 50 MCG/ACT nasal spray Place 2 sprays into the nose 2 (two) times daily as needed.     . latanoprost (XALATAN) 0.005 % ophthalmic solution Place 1 drop into both eyes at bedtime. 7.5 mL 3  . levalbuterol (XOPENEX HFA) 45 MCG/ACT inhaler Inhale 1-2 puffs into the lungs every 4 (four) hours as needed for wheezing. 1 Inhaler 12  . levothyroxine (SYNTHROID, LEVOTHROID) 75 MCG tablet Take 1 tablet (75 mcg total) by mouth daily before breakfast. 90 tablet 3  .  mometasone (NASONEX) 50 MCG/ACT nasal spray Place 2 sprays into the nose daily.    . Multiple Vitamin (MULTIVITAMIN) tablet Take 1 tablet by mouth daily.    . nitroGLYCERIN (NITROSTAT) 0.4 MG SL tablet Place 1 tablet (0.4 mg total) under the tongue every 5 (five) minutes as needed for chest pain. 90 tablet 3  . OXYCODONE HCL PO Take 10 mg by mouth as needed.    . pregabalin (LYRICA) 100 MG capsule 100 mg. Take  2 every evening for peripheral neuropathy    . tadalafil (CIALIS) 20 MG tablet Take 20 mg by mouth daily as needed. For erectile dysfunction    . zaleplon (SONATA) 10 MG capsule Take 10 mg by mouth at bedtime as needed for sleep.    . [DISCONTINUED] calcium-vitamin D (OSCAL WITH D) 500-200 MG-UNIT per tablet Take 1 tablet by mouth daily.    . [DISCONTINUED] diphenhydrAMINE (SOMINEX) 25 MG tablet Take 50 mg by mouth at bedtime as needed. For sleep     No current facility-administered medications for this visit.     Past Medical History  Diagnosis Date  . Bronchitis   . Hypertension   . Gout   . BPH (benign prostatic hyperplasia)   . Spinal compression fracture seventh vertebre  . COPD (chronic obstructive pulmonary disease)     2 liters O2 HS  . Hypoxia   . Fibromyalgia   .  Peripheral neuropathy   . Osteoarthritis   . GERD (gastroesophageal reflux disease)   . Hypothyroidism   . Insomnia   . Rotator cuff tear, left   . Glaucoma     bilateral eyes  . Fatty tumor     waste and back  . Atrial fibrillation   . Depression   . Cataract   . Wears glasses   . On home oxygen therapy     at night with cpap  . Sleep apnea     uses cpap-add oxygen at night    Past Surgical History  Procedure Laterality Date  . Tonsil    . Appendectomy  age 89  . Nasal concha bullosa resection  age 19  . Vasectomy  age 49  . Inguinal hernia repair  age 69    rt side  . Cholecystectomy  age 45  . Cataract extraction      bilateral  . Eye surgery    . Prostate surgery    . Shoulder  arthroscopy w/ rotator cuff repair Right     early 2000s  . Carpal tunnel release Right     early 2000s  . Foot arthrodesis Right 02/02/2013    Procedure: RIGHT HALLUX METATARSAL PHALANGEAL JOINT ARTHRODESIS ;  Surgeon: Wylene Simmer, MD;  Location: [facility];  Service: Orthopedics;  Laterality: Right;    History   Social History  . Marital Status: Single    Spouse Name: N/A  . Number of Children: N/A  . Years of Education: N/A   Occupational History  . RETIRED    Social History Main Topics  . Smoking status: Former Smoker -- 2.00 packs/day for 10 years    Types: Cigarettes    Quit date: 07/18/1975  . Smokeless tobacco: Never Used  . Alcohol Use: Yes     Comment: 1-2 drinks/day  . Drug Use: No  . Sexual Activity: Not on file   Other Topics Concern  . Not on file   Social History Narrative    ROS: significant fatigue but no fevers or chills, productive cough, hemoptysis, dysphasia, odynophagia, melena, hematochezia, dysuria, hematuria, rash, seizure activity, orthopnea, PND, pedal edema, claudication. Remaining systems are negative.  Physical Exam: Well-developed well-nourished in no acute distress.  Skin is warm and dry.  HEENT is normal.  Neck is supple.  Chest is clear to auscultation with normal expansion.  Cardiovascular exam is regular rate and rhythm.  Abdominal exam nontender or distended. No masses palpated. Extremities show no edema. neuro grossly intact

## 2015-01-08 ENCOUNTER — Ambulatory Visit (INDEPENDENT_AMBULATORY_CARE_PROVIDER_SITE_OTHER): Payer: Medicare Other | Admitting: Cardiology

## 2015-01-08 ENCOUNTER — Encounter: Payer: Self-pay | Admitting: Cardiology

## 2015-01-08 VITALS — BP 138/64 | HR 70 | Ht 67.0 in | Wt 206.0 lb

## 2015-01-08 DIAGNOSIS — I4891 Unspecified atrial fibrillation: Secondary | ICD-10-CM | POA: Diagnosis not present

## 2015-01-08 DIAGNOSIS — R5383 Other fatigue: Secondary | ICD-10-CM

## 2015-01-08 LAB — BASIC METABOLIC PANEL WITH GFR
BUN: 24 mg/dL (ref 7–25)
CO2: 28 mEq/L (ref 20–31)
Calcium: 9.6 mg/dL (ref 8.6–10.3)
Chloride: 102 mEq/L (ref 98–110)
Creat: 1.06 mg/dL (ref 0.70–1.18)
GFR, Est African American: 77 mL/min (ref >=60)
GFR, Est Non African American: 66 mL/min (ref >=60)
Glucose, Bld: 98 mg/dL (ref 65–99)
Potassium: 4.8 mEq/L (ref 3.5–5.3)
Sodium: 141 mEq/L (ref 135–146)

## 2015-01-08 LAB — CBC
HCT: 41.4 % (ref 39.0–52.0)
Hemoglobin: 14 g/dL (ref 13.0–17.0)
MCH: 31 pg (ref 26.0–34.0)
MCHC: 33.8 g/dL (ref 30.0–36.0)
MCV: 91.8 fL (ref 78.0–100.0)
MPV: 11.1 fL (ref 8.6–12.4)
Platelets: 174 10*3/uL (ref 150–400)
RBC: 4.51 MIL/uL (ref 4.22–5.81)
RDW: 14.2 % (ref 11.5–15.5)
WBC: 5 10*3/uL (ref 4.0–10.5)

## 2015-01-08 MED ORDER — APIXABAN 5 MG PO TABS: 5.0000 mg | ORAL_TABLET | Freq: Two times a day (BID) | ORAL | Status: DC

## 2015-01-08 NOTE — Assessment & Plan Note (Signed)
Patient is on sinus rhythm on examination. Anticoagulation was discontinued previously because of falls but this is much improved. I will discontinue aspirin. Begin apixaban 5 mg BID; check hemoglobin and renal function today and again in 4 weeks. Continue Cardizem.

## 2015-01-08 NOTE — Assessment & Plan Note (Signed)
Check TSH and hemoglobin.

## 2015-01-08 NOTE — Patient Instructions (Signed)
Your physician wants you to follow-up in: [place] will receive a reminder letter in the mail two months in advance. If you don't receive a letter, please call our office to schedule the follow-up appointment.   STOP ASPIRIN  START ELIQUIS 5 MG ONE TABLET TWICE DAILY  Your physician recommends that you HAVE LAB WORK TODAY  Your physician recommends that you return for lab work in: [place]

## 2015-01-08 NOTE — Assessment & Plan Note (Signed)
Blood pressure controlled. Continue present medications. 

## 2015-01-09 DIAGNOSIS — M1712 Unilateral primary osteoarthritis, left knee: Secondary | ICD-10-CM | POA: Diagnosis not present

## 2015-01-15 DIAGNOSIS — M47817 Spondylosis without myelopathy or radiculopathy, lumbosacral region: Secondary | ICD-10-CM | POA: Diagnosis not present

## 2015-01-15 DIAGNOSIS — M545 Low back pain: Secondary | ICD-10-CM | POA: Diagnosis not present

## 2015-01-15 DIAGNOSIS — M4697 Unspecified inflammatory spondylopathy, lumbosacral region: Secondary | ICD-10-CM | POA: Diagnosis not present

## 2015-01-29 DIAGNOSIS — M47817 Spondylosis without myelopathy or radiculopathy, lumbosacral region: Secondary | ICD-10-CM | POA: Diagnosis not present

## 2015-01-31 ENCOUNTER — Ambulatory Visit: Payer: Medicare Other | Admitting: Cardiology

## 2015-01-31 DIAGNOSIS — R739 Hyperglycemia, unspecified: Secondary | ICD-10-CM | POA: Diagnosis not present

## 2015-01-31 DIAGNOSIS — M810 Age-related osteoporosis without current pathological fracture: Secondary | ICD-10-CM | POA: Diagnosis not present

## 2015-01-31 DIAGNOSIS — E785 Hyperlipidemia, unspecified: Secondary | ICD-10-CM | POA: Diagnosis not present

## 2015-01-31 DIAGNOSIS — E559 Vitamin D deficiency, unspecified: Secondary | ICD-10-CM | POA: Diagnosis not present

## 2015-01-31 DIAGNOSIS — M1000 Idiopathic gout, unspecified site: Secondary | ICD-10-CM | POA: Diagnosis not present

## 2015-02-12 DIAGNOSIS — M47817 Spondylosis without myelopathy or radiculopathy, lumbosacral region: Secondary | ICD-10-CM | POA: Diagnosis not present

## 2015-02-15 DIAGNOSIS — H35371 Puckering of macula, right eye: Secondary | ICD-10-CM | POA: Diagnosis not present

## 2015-02-15 DIAGNOSIS — H43813 Vitreous degeneration, bilateral: Secondary | ICD-10-CM | POA: Diagnosis not present

## 2015-03-06 DIAGNOSIS — M545 Low back pain: Secondary | ICD-10-CM | POA: Diagnosis not present

## 2015-03-06 DIAGNOSIS — M47817 Spondylosis without myelopathy or radiculopathy, lumbosacral region: Secondary | ICD-10-CM | POA: Diagnosis not present

## 2015-03-06 DIAGNOSIS — M4697 Unspecified inflammatory spondylopathy, lumbosacral region: Secondary | ICD-10-CM | POA: Diagnosis not present

## 2015-03-06 DIAGNOSIS — M546 Pain in thoracic spine: Secondary | ICD-10-CM | POA: Diagnosis not present

## 2015-04-02 ENCOUNTER — Other Ambulatory Visit (INDEPENDENT_AMBULATORY_CARE_PROVIDER_SITE_OTHER): Payer: Medicare Other

## 2015-04-02 ENCOUNTER — Encounter: Payer: Self-pay | Admitting: Emergency Medicine

## 2015-04-02 ENCOUNTER — Ambulatory Visit (INDEPENDENT_AMBULATORY_CARE_PROVIDER_SITE_OTHER): Payer: Medicare Other | Admitting: Emergency Medicine

## 2015-04-02 VITALS — BP 122/72 | HR 73 | Ht 67.0 in | Wt 211.0 lb

## 2015-04-02 DIAGNOSIS — R5383 Other fatigue: Secondary | ICD-10-CM

## 2015-04-02 DIAGNOSIS — G4733 Obstructive sleep apnea (adult) (pediatric): Secondary | ICD-10-CM

## 2015-04-02 DIAGNOSIS — J449 Chronic obstructive pulmonary disease, unspecified: Secondary | ICD-10-CM | POA: Diagnosis not present

## 2015-04-02 LAB — TSH: TSH: 2.05 u[IU]/mL (ref 0.35–4.50)

## 2015-04-02 NOTE — Assessment & Plan Note (Signed)
Severe fatigue that appears to be predictable in that it happens in the afternoon and then again right after he gets up in the morning from sleep. He underwent a CPAP titration but I don't have that available to me at this time. If his CPAP needs to be increased and this could explain his hypersomnolence. I'm also concerned that this could be related to medications. He is on several that could make him fatigued and we may need to adjust or stop these depending on our progress. It looks like he was supposed to have a TSH drawn in July but I don't see that this was done. We will recheck

## 2015-04-02 NOTE — Assessment & Plan Note (Signed)
Continue dulera twice a day Continue albuterol when necessary

## 2015-04-02 NOTE — Patient Instructions (Addendum)
We will review your CPAP report from [facility] and send orders to change the ramp-up and your pressures if indicated Need to consider the possibility that some of your fatigue may be related to medications. We will consider changing the timing or doses of these in the future if the fatigue persists.  We will recheck you TSH today if this has not already been done.  Continue Dulera as you have been taking it Follow with Dr Lamonte Sakai in 3 months or sooner if you have any problems.

## 2015-04-02 NOTE — Assessment & Plan Note (Signed)
We will adjust his CPAP based on his [facility] CPAP titration study. Also we will get rid of his ramp because he does not tolerate it well

## 2015-04-02 NOTE — Progress Notes (Signed)
Subjective:    Patient ID: Jonathan [name], male    DOB: [birth year], 81 y.o.   MRN: [id number] HPI 81 yo man, former smoker, hx of A Fib, COPD on nocturnal O2, GERD, also OSA not on CPAP for over 4 yrs. He has had PFT done in [place] Fall 2012 that supported COPD, walking oximetry that showed ? Hypoxemia, ONO with desats and started on . Began to have AE +/- PNA in 1/13 that never really got better. He was admitted to [facility] 2/4-07/21/11 for an AE-COPD associated with A fib + RVR.  Was rx steroids, abx, BD's. He is better but is limited to some degree, not back to prior baseline. He is low on energy. He is having trouble sleeping, difficulty falling asleep and staying asleep. He snores, no witnessed apnea. No real wheezing or coughing at this time.   ROV 10/07/11 -- 78yom hx of A Fib, COPD on nocturnal O2, GERD, also OSA not on CPAP for over 23yrs. Returns after PSG 09/17/11, shows AHI 27/hour. Also obtained his PFT from [place] from 11/12 - FEV1 71% predicted with positive BD response. Still having trouble w exertion, limited by arthritis more than breathing.Tolerating Symbicort, not really sure it is helping him.    ROV 12/04/11 -- 78yom hx of A Fib, COPD on nocturnal O2, GERD, OSA. We recently started back on CPAP, his download data 5/18-6/15 shows he wears it longer than 4 hours for 58% of the nights. He is working on increasing compliance. All barriers discussed, should be able to overcome. He has new mask that is better. He now has O2 bled in and has been wearing it all night.  Last time we stopped Symbicort - he hasn't missed it. Hasn't used the SABA.   ROV 06/20/12 -- 81 yo man, hx COPD/+BD response, OSA. He is getting his CPAP thru [place], his O2 thru [place]. Uses albuterol very rarely. He still has exertional SOB. He is trying to get new CPAP mask, the company hasn't gotten it to him yet. He hasn't been using CPAP for about two weeks - he is planning to go back to it.   ROV 07/06/13 --  follows up for COPD/+BD response, OSA.  He has done fairly well over the last year although he has noticed an increased WOB and wheeze since he started moving his house furniture to new home.  He has combivent and albuterol (old) available and has tried them w some relief. No longer on scheduled meds. No flares. He is also concerned that his CPAP pressures may not be adequate. Wants new company for CPAP > [employer]. Changing MD's to Dr Armstead Peaks.   ROV 08/29/13 -- Hx of COPD/+BD response, OSA. We changed his DME company, he is wearing CPAP reliably. We added dulera and stopped combivent. He still occassionally naps. Does not fall asleep unintentionally but he is tired when he wakes up in the am. He has an auto-set device, feels that he needs a higher ramp start pressure. He believes that the dulera was a good move.   ROV 03/16/14 -- Hx of COPD/+BD response, OSA. He has been doing fairly well. He is having some nasal congestion and productive cough for about a week. The mucous is thin yellow from his nose and from his chest. He is using nasonex.       ROV 08/17/14 -- follow up visit for COPD, OSA, rhinitis. He reports that he has been needing a nap only a few  hours after he gets up in the am, also wants to sleep in the afternoon. He had [employer] check the device and it is working fine. He wears the CPAP reliably. His wt is down about 15 lbs. He is on dulera, feels that his breathing is not limiting him. He tried stopping it - wasn't sure whether he lost ground but his wife thought he did so he restarted. He notes that he can't exercise but this is due to pain.   ROV 04/02/15 -- Follow-up visit for COPD and obstructive sleep apnea, rhinitis.  He underwent CPAP re-titration after our last visit but I cannot find the results this pm. He has stopped the [facility] a couple times to see if he would miss it > he restarted it. He is having am sleepiness, does not feel restored even though good CPAP compliance.  No  flares of his breathing. Has only used SABA x 1 ever. He is able to exert, hike, climb stairs. ? Whether he is having effects of medications, diltiazem / lyrica / sonata.      Objective:   Physical Exam Filed Vitals:   04/02/15 1441  BP: 122/72  Pulse: 73  Height: 5\' 7"  (1.702 m)  Weight: 211 lb (95.709 kg)  SpO2: 93%    Gen: Pleasant, well-nourished, in no distress,  normal affect  ENT: No lesions,  mouth clear,  oropharynx clear, no postnasal drip  Neck: No JVD, no TMG, no carotid bruits  Lungs: No use of accessory muscles, clear without rales or rhonchi  Cardiovascular: RRR, heart sounds normal, no murmur or gallops, no peripheral edema  Musculoskeletal: No deformities, no cyanosis or clubbing  Neuro: alert, non focal  Skin: Warm, no lesions or rashes     Assessment & Plan:  Fatigue Severe fatigue that appears to be predictable in that it happens in the afternoon and then again right after he gets up in the morning from sleep. He underwent a CPAP titration but I don't have that available to me at this time. If his CPAP needs to be increased and this could explain his hypersomnolence. I'm also concerned that this could be related to medications. He is on several that could make him fatigued and we may need to adjust or stop these depending on our progress. It looks like he was supposed to have a TSH drawn in July but I don't see that this was done. We will recheck  COPD (chronic obstructive pulmonary disease) Continue dulera twice a day Continue albuterol when necessary  Sleep apnea, obstructive We will adjust his CPAP based on his [employer] CPAP titration study. Also we will get rid of his ramp because he does not tolerate it well

## 2015-04-02 NOTE — Addendum Note (Signed)
Addended by: Desmond Dike C on: 04/02/2015 03:12 PM   Modules accepted: Orders

## 2015-04-19 DIAGNOSIS — K219 Gastro-esophageal reflux disease without esophagitis: Secondary | ICD-10-CM | POA: Diagnosis not present

## 2015-04-19 DIAGNOSIS — J449 Chronic obstructive pulmonary disease, unspecified: Secondary | ICD-10-CM | POA: Diagnosis not present

## 2015-04-19 DIAGNOSIS — R5383 Other fatigue: Secondary | ICD-10-CM | POA: Diagnosis not present

## 2015-04-19 DIAGNOSIS — R7989 Other specified abnormal findings of blood chemistry: Secondary | ICD-10-CM | POA: Diagnosis not present

## 2015-04-29 DIAGNOSIS — I4891 Unspecified atrial fibrillation: Secondary | ICD-10-CM | POA: Diagnosis not present

## 2015-04-30 LAB — CBC
HCT: 40.4 % (ref 39.0–52.0)
Hemoglobin: 13.7 g/dL (ref 13.0–17.0)
MCH: 31 pg (ref 26.0–34.0)
MCHC: 33.9 g/dL (ref 30.0–36.0)
MCV: 91.4 fL (ref 78.0–100.0)
MPV: 10.9 fL (ref 8.6–12.4)
Platelets: 172 10*3/uL (ref 150–400)
RBC: 4.42 MIL/uL (ref 4.22–5.81)
RDW: 14.4 % (ref 11.5–15.5)
WBC: 5.8 10*3/uL (ref 4.0–10.5)

## 2015-04-30 LAB — BASIC METABOLIC PANEL WITH GFR
BUN: 16 mg/dL (ref 7–25)
CO2: 28 mmol/L (ref 20–31)
Calcium: 8.7 mg/dL (ref 8.6–10.3)
Chloride: 107 mmol/L (ref 98–110)
Creat: 0.88 mg/dL (ref 0.70–1.18)
GFR, Est African American: >89 mL/min (ref >=60)
GFR, Est Non African American: 82 mL/min (ref >=60)
Glucose, Bld: 112 mg/dL — ABNORMAL HIGH (ref 65–99)
Potassium: 3.7 mmol/L (ref 3.5–5.3)
Sodium: 143 mmol/L (ref 135–146)

## 2015-04-30 LAB — TSH: TSH: 2.327 u[IU]/mL (ref 0.350–4.500)

## 2015-05-21 DIAGNOSIS — J449 Chronic obstructive pulmonary disease, unspecified: Secondary | ICD-10-CM | POA: Diagnosis not present

## 2015-05-21 DIAGNOSIS — G4733 Obstructive sleep apnea (adult) (pediatric): Secondary | ICD-10-CM | POA: Diagnosis not present

## 2015-05-21 DIAGNOSIS — R5383 Other fatigue: Secondary | ICD-10-CM | POA: Diagnosis not present

## 2015-05-21 DIAGNOSIS — K219 Gastro-esophageal reflux disease without esophagitis: Secondary | ICD-10-CM | POA: Diagnosis not present

## 2015-06-25 DIAGNOSIS — R05 Cough: Secondary | ICD-10-CM | POA: Diagnosis not present

## 2015-07-04 DIAGNOSIS — M2022 Hallux rigidus, left foot: Secondary | ICD-10-CM | POA: Diagnosis not present

## 2015-07-11 ENCOUNTER — Ambulatory Visit (INDEPENDENT_AMBULATORY_CARE_PROVIDER_SITE_OTHER): Payer: Medicare Other | Admitting: Emergency Medicine

## 2015-07-11 ENCOUNTER — Encounter: Payer: Self-pay | Admitting: Emergency Medicine

## 2015-07-11 VITALS — BP 124/82 | HR 60 | Ht 67.0 in | Wt 205.0 lb

## 2015-07-11 DIAGNOSIS — G4733 Obstructive sleep apnea (adult) (pediatric): Secondary | ICD-10-CM | POA: Diagnosis not present

## 2015-07-11 DIAGNOSIS — J449 Chronic obstructive pulmonary disease, unspecified: Secondary | ICD-10-CM | POA: Diagnosis not present

## 2015-07-11 NOTE — Patient Instructions (Addendum)
Please continue your CPAP at [place] + oxygen at 2L/min.  Please continue your inhaled medications as you have been using them  Continue your nasal spray and your loratadine as you have been taking them .  Follow with Dr Lamonte Sakai in 4 months or sooner if you have any problems.

## 2015-07-11 NOTE — Assessment & Plan Note (Signed)
Recent flare, treated w abx and pred. appearas to be back to baseline. Will continue dulera, SABA, allergy regimen

## 2015-07-11 NOTE — Assessment & Plan Note (Signed)
Continue CPAP 8 cm water plus oxygen. Based on his recent repeat sleep study I do not believe his significant fatigue is due to his sleep apnea. Suspect  that is instead related to his medications

## 2015-07-11 NOTE — Progress Notes (Signed)
Subjective:    Patient ID: Jonathan [name], male    DOB: 10-Mar-1933, 82 y.o.   MRN: [id number] HPI 82 yo man, former smoker, hx of A Fib, COPD on nocturnal O2, GERD, also OSA not on CPAP for over 4 yrs. He has had PFT done in [place] Fall 2012 that supported COPD, walking oximetry that showed ? Hypoxemia, ONO with desats and started on . Began to have AE +/- PNA in 1/13 that never really got better. He was admitted to [facility] 2/4-07/21/11 for an AE-COPD associated with A fib + RVR.  Was rx steroids, abx, BD's. He is better but is limited to some degree, not back to prior baseline. He is low on energy. He is having trouble sleeping, difficulty falling asleep and staying asleep. He snores, no witnessed apnea. No real wheezing or coughing at this time.   ROV 10/07/11 -- 78yom hx of A Fib, COPD on nocturnal O2, GERD, also OSA not on CPAP for over 47yrs. Returns after PSG 09/17/11, shows AHI 27/hour. Also obtained his PFT from [facility] from 11/12 - FEV1 71% predicted with positive BD response. Still having trouble w exertion, limited by arthritis more than breathing.Tolerating Symbicort, not really sure it is helping him.    ROV 12/04/11 -- 78yom hx of A Fib, COPD on nocturnal O2, GERD, OSA. We recently started back on CPAP, his download data 5/18-6/15 shows he wears it longer than 4 hours for 58% of the nights. He is working on increasing compliance. All barriers discussed, should be able to overcome. He has new mask that is better. He now has O2 bled in and has been wearing it all night.  Last time we stopped Symbicort - he hasn't missed it. Hasn't used the SABA.   ROV 06/20/12 -- 82 yo man, hx COPD/+BD response, OSA. He is getting his CPAP thru [place], his O2 thru [place]. Uses albuterol very rarely. He still has exertional SOB. He is trying to get new CPAP mask, the company hasn't gotten it to him yet. He hasn't been using CPAP for about two weeks - he is planning to go back to it.   ROV 07/06/13 --  follows up for COPD/+BD response, OSA.  He has done fairly well over the last year although he has noticed an increased WOB and wheeze since he started moving his house furniture to new home.  He has combivent and albuterol (old) available and has tried them w some relief. No longer on scheduled meds. No flares. He is also concerned that his CPAP pressures may not be adequate. Wants new company for CPAP > [employer]. Changing MD's to Dr Armstead Peaks.   ROV 08/29/13 -- Hx of COPD/+BD response, OSA. We changed his DME company, he is wearing CPAP reliably. We added dulera and stopped combivent. He still occassionally naps. Does not fall asleep unintentionally but he is tired when he wakes up in the am. He has an auto-set device, feels that he needs a higher ramp start pressure. He believes that the dulera was a good move.   ROV 03/16/14 -- Hx of COPD/+BD response, OSA. He has been doing fairly well. He is having some nasal congestion and productive cough for about a week. The mucous is thin yellow from his nose and from his chest. He is using nasonex.       ROV 08/17/14 -- follow up visit for COPD, OSA, rhinitis. He reports that he has been needing a nap only a few  hours after he gets up in the am, also wants to sleep in the afternoon. He had [employer] check the device and it is working fine. He wears the CPAP reliably. His wt is down about 15 lbs. He is on dulera, feels that his breathing is not limiting him. He tried stopping it - wasn't sure whether he lost ground but his wife thought he did so he restarted. He notes that he can't exercise but this is due to pain.   ROV 04/02/15 -- Follow-up visit for COPD and obstructive sleep apnea, rhinitis.  He underwent CPAP re-titration after our last visit but I cannot find the results this pm. He has stopped the [facility] a couple times to see if he would miss it > he restarted it. He is having am sleepiness, does not feel restored even though good CPAP compliance.  No  flares of his breathing. Has only used SABA x 1 ever. He is able to exert, hike, climb stairs. ? Whether he is having effects of medications, diltiazem / lyrica / sonata.   ROV 07/11/15 - history of COPD, rhinitis, obstructive sleep apnea on CPAP.   He has been compliant with is CPAP, had a repeat PSG in 12/16 that showed that he needed 8cmH20 + o2.  He continues to be very fatigued. Takes sonata rarely. Is on cymbalta, lyrica. His COPD > was just treated for a flare 3 weeks ago.       Objective:   Physical Exam Filed Vitals:   07/11/15 1445 07/11/15 1448  BP:  124/82  Pulse:  60  Height: 5\' 7"  (1.702 m)   Weight: 205 lb (92.987 kg)   SpO2:  95%    Gen: Pleasant, well-nourished, in no distress,  normal affect  ENT: No lesions,  mouth clear,  oropharynx clear, no postnasal drip  Neck: No JVD, no TMG, no carotid bruits  Lungs: No use of accessory muscles, clear without rales or rhonchi  Cardiovascular: RRR, heart sounds normal, no murmur or gallops, no peripheral edema  Musculoskeletal: No deformities, no cyanosis or clubbing  Neuro: alert, non focal  Skin: Warm, no lesions or rashes     Assessment & Plan:  COPD (chronic obstructive pulmonary disease) Recent flare, treated w abx and pred. appearas to be back to baseline. Will continue dulera, SABA, allergy regimen  Sleep apnea, obstructive Continue CPAP 8 cm water plus oxygen. Based on his recent repeat sleep study I do not believe his significant fatigue is due to his sleep apnea. Suspect  that is instead related to his medications

## 2015-07-16 DIAGNOSIS — J449 Chronic obstructive pulmonary disease, unspecified: Secondary | ICD-10-CM | POA: Diagnosis not present

## 2015-07-16 DIAGNOSIS — I4891 Unspecified atrial fibrillation: Secondary | ICD-10-CM | POA: Diagnosis not present

## 2015-07-16 DIAGNOSIS — F339 Major depressive disorder, recurrent, unspecified: Secondary | ICD-10-CM | POA: Diagnosis not present

## 2015-07-16 DIAGNOSIS — F17211 Nicotine dependence, cigarettes, in remission: Secondary | ICD-10-CM | POA: Diagnosis not present

## 2015-07-26 DIAGNOSIS — H04123 Dry eye syndrome of bilateral lacrimal glands: Secondary | ICD-10-CM | POA: Diagnosis not present

## 2015-07-26 DIAGNOSIS — H401131 Primary open-angle glaucoma, bilateral, mild stage: Secondary | ICD-10-CM | POA: Diagnosis not present

## 2015-08-17 ENCOUNTER — Ambulatory Visit (INDEPENDENT_AMBULATORY_CARE_PROVIDER_SITE_OTHER): Payer: Medicare Other | Admitting: Family Medicine

## 2015-08-17 ENCOUNTER — Ambulatory Visit (HOSPITAL_BASED_OUTPATIENT_CLINIC_OR_DEPARTMENT_OTHER)
Admission: RE | Admit: 2015-08-17 | Discharge: 2015-08-17 | Disposition: A | Discharge status: Home / Self Care | Payer: Medicare Other | Source: Ambulatory Visit | Attending: Family Medicine | Admitting: Family Medicine

## 2015-08-17 ENCOUNTER — Telehealth: Payer: Self-pay | Admitting: Radiology

## 2015-08-17 VITALS — BP 128/80 | HR 85 | Temp 97.6°F | Resp 18 | Wt 220.6 lb

## 2015-08-17 DIAGNOSIS — M79601 Pain in right arm: Secondary | ICD-10-CM | POA: Diagnosis not present

## 2015-08-17 DIAGNOSIS — R609 Edema, unspecified: Secondary | ICD-10-CM

## 2015-08-17 DIAGNOSIS — L03115 Cellulitis of right lower limb: Secondary | ICD-10-CM

## 2015-08-17 DIAGNOSIS — M7989 Other specified soft tissue disorders: Secondary | ICD-10-CM | POA: Diagnosis not present

## 2015-08-17 DIAGNOSIS — M79604 Pain in right leg: Secondary | ICD-10-CM

## 2015-08-17 DIAGNOSIS — M79661 Pain in right lower leg: Secondary | ICD-10-CM | POA: Diagnosis present

## 2015-08-17 LAB — POCT CBC
Granulocyte percent: 54.1 %G (ref 37–80)
HCT, POC: 38.6 % — AB (ref 43.5–53.7)
Hemoglobin: 13.5 g/dL — AB (ref 14.1–18.1)
Lymph, poc: 1.8 (ref 0.6–3.4)
MCH, POC: 31.3 pg — AB (ref 27–31.2)
MCHC: 35 g/dL (ref 31.8–35.4)
MCV: 89.4 fL (ref 80–97)
MID (cbc): 1.1 — AB (ref 0–0.9)
MPV: 7.8 fL (ref 0–99.8)
POC Granulocyte: 3.4 (ref 2–6.9)
POC LYMPH PERCENT: 28.6 %L (ref 10–50)
POC MID %: 17.3 %M — AB (ref 0–12)
Platelet Count, POC: 104 10*3/uL — AB (ref 142–424)
RBC: 4.32 M/uL — AB (ref 4.69–6.13)
RDW, POC: 16.1 %
WBC: 6.3 10*3/uL (ref 4.6–10.2)

## 2015-08-17 LAB — BASIC METABOLIC PANEL
BUN: 19 mg/dL (ref 7–25)
CO2: 28 mmol/L (ref 20–31)
Calcium: 9 mg/dL (ref 8.6–10.3)
Chloride: 103 mmol/L (ref 98–110)
Creat: 0.99 mg/dL (ref 0.70–1.11)
Glucose, Bld: 108 mg/dL — ABNORMAL HIGH (ref 65–99)
Potassium: 4.5 mmol/L (ref 3.5–5.3)
Sodium: 140 mmol/L (ref 135–146)

## 2015-08-17 LAB — POCT SEDIMENTATION RATE: POCT SED RATE: 15 mm/hr (ref 0–22)

## 2015-08-17 MED ORDER — CEPHALEXIN 500 MG PO CAPS: 500.0000 mg | ORAL_CAPSULE | Freq: Three times a day (TID) | ORAL | Status: DC

## 2015-08-17 NOTE — Telephone Encounter (Signed)
Called patient to advise doppler study did not show any blood clots. I have advised his wife, he is not home from the doppler study yet

## 2015-08-17 NOTE — Patient Instructions (Addendum)
Because you received labwork today, you will receive an invoice from [employer]. Please contact [facility] at [phone] with questions or concerns regarding your invoice. Our billing staff will not be able to assist you with those questions.  You will be contacted with the lab results as soon as they are available. The fastest way to get your results is to activate your My Chart account. Instructions are located on the last page of this paperwork. If you have not heard from [place] regarding the results in 2 weeks, please contact this office.  [place] today to rule out blood clot  /go now  [facility] / go to the emergency room, tell them you are there to register, but not to be seen in ER  Driving directions to [facility] [id number]  - more info    [address]  [place], [place] [address]     1. Head south on [address] toward [address]      0.7 mi    2. Turn left [address]      0.4 mi    3. Take the 3rd right onto [address]      1.1 mi    4. [address] to [facility]      0.2 mi    5. Merge onto [address]      3.7 mi    6. Take [address] toward [place]      0.3 mi    7. Keep left at the fork, follow signs for [address]      381 ft    8. Keep left at the fork, follow signs for [facility] [address]/[place]      302 ft    9. Turn left onto [address]      2.6 mi    10. Turn right onto [address] will be on the left     0.2 mi     [employer]

## 2015-08-17 NOTE — Progress Notes (Signed)
Patient ID: Jonathan [name], male    DOB: [birth year]  Age: 82 y.o. MRN: [id number]  Chief Complaint  Patient presents with  . Cellulitis    lower right leg, noticed on Thursday    Subjective:   Patient came in from the [place] last night. He started getting more edema the last several days of both legs, mostly on the right. He developed redness and pain of the right shin 2 days ago. It's very tender. He did not get any cuts. Not walking barefooted. He is scheduled to fly [place] on Monday. He said he ate too much and drinking much this week on their vacation. He now has a primary care doctor, Dr. Noah Delaine. He also sees a cardiologist and pulmonologist. I have not seen him for a long time.  Current allergies, medications, problem list, past/family and social histories reviewed.  Objective:  BP 128/80 mmHg  Pulse 85  Temp(Src) 97.6 F (36.4 C) (Oral)  Resp 18  Wt 220 lb 9.6 oz (100.064 kg)  SpO2 94%  3+ edema right lower extremity, 2+ left. Tender to palpation, but especially the right in the area of erythema. He has a area of cellulitis and erythema about 4 inches in diameter on the right ankle . Negative Homans. Calf is swollen but nontender.  Assessment & Plan:   Assessment: 1. Cellulitis of right ankle   2. Edema, unspecified type   3. Leg pain, inferior, right       Plan: CBC and proceed from there.  Orders Placed This Encounter  Procedures  . US Venous Img Lower Unilateral Right    Standing Status: Future     Number of Occurrences: 1     Standing Expiration Date: 10/16/2016    Order Specific Question:  Reason for Exam (SYMPTOM  OR DIAGNOSIS REQUIRED)    Answer:  right lower leg pain swelling    Order Specific Question:  Preferred imaging location?    Answer:  [facility]  . Basic metabolic panel  . POCT CBC  . POCT SEDIMENTATION RATE   Results for orders placed or performed in visit on 08/17/15  POCT CBC  Result Value Ref Range   WBC  6.3 4.6 - 10.2 K/uL   Lymph, poc 1.8 0.6 - 3.4   POC LYMPH PERCENT 28.6 10 - 50 %L   MID (cbc) 1.1 (A) 0 - 0.9   POC MID % 17.3 (A) 0 - 12 %M   POC Granulocyte 3.4 2 - 6.9   Granulocyte percent 54.1 37 - 80 %G   RBC 4.32 (A) 4.69 - 6.13 M/uL   Hemoglobin 13.5 (A) 14.1 - 18.1 g/dL   HCT, POC 38.6 (A) 43.5 - 53.7 %   MCV 89.4 80 - 97 fL   MCH, POC 31.3 (A) 27 - 31.2 pg   MCHC 35.0 31.8 - 35.4 g/dL   RDW, POC 16.1 %   Platelet Count, POC 104 (A) 142 - 424 K/uL   MPV 7.8 0 - 99.8 fL  POCT SEDIMENTATION RATE  Result Value Ref Range   POCT SED RATE 15 0 - 22 mm/hr    He is on anticoagulants already. However I will get a ultrasound to rule out DVT. I think this is just the edema from being dependent and high salt diet, and cellulitis of the ankle. We'll place on antibiotics. Advise against flanked [place]. Meds ordered this encounter  Medications  . cephALEXin (KEFLEX) 500 MG capsule    Sig: Take  1 capsule (500 mg total) by mouth 3 (three) times daily.    Dispense:  21 capsule    Refill:  0         Patient Instructions   Because you received labwork today, you will receive an invoice from [employer]. Please contact [facility] at [phone] with questions or concerns regarding your invoice. Our billing staff will not be able to assist you with those questions.  You will be contacted with the lab results as soon as they are available. The fastest way to get your results is to activate your My Chart account. Instructions are located on the last page of this paperwork. If you have not heard from [place] regarding the results in 2 weeks, please contact this office.  [place] today to rule out blood clot  /go now  [facility] / go to the emergency room, tell them you are there to register, but not to be seen in ER  Driving directions to [facility] [id number]  - more info    [address]  [place], [place] [address]     1. Head south on [address]  toward [address]      0.7 mi    2. Turn left onto [address]      0.4 mi    3. Take the 3rd right onto [facility] [address]      1.1 mi    4. Take the [address] ramp to [facility]      0.2 mi    5. Merge onto [address]      3.7 mi    6. Take exit [address] for [address] toward [place]      0.3 mi    7. Keep left at the fork, follow signs for [place]      381 ft    8. Keep left at the fork, follow signs for [facility] S/[place]      302 ft    9. Turn left onto [address]      2.6 mi    10. Turn right onto [address] will be on the left     0.2 mi     [place]      Scan was negative for DVT. We called his house and left the message with his wife.  Return if symptoms worsen or fail to improve.   Aubre Quincy, MD 08/17/2015

## 2015-08-21 DIAGNOSIS — L03115 Cellulitis of right lower limb: Secondary | ICD-10-CM | POA: Diagnosis not present

## 2015-08-28 ENCOUNTER — Encounter: Payer: Self-pay | Admitting: Neurology

## 2015-08-28 ENCOUNTER — Ambulatory Visit (INDEPENDENT_AMBULATORY_CARE_PROVIDER_SITE_OTHER): Payer: Medicare Other | Admitting: Neurology

## 2015-08-28 VITALS — BP 147/70 | HR 93 | Ht 67.0 in | Wt 214.0 lb

## 2015-08-28 DIAGNOSIS — G609 Hereditary and idiopathic neuropathy, unspecified: Secondary | ICD-10-CM

## 2015-08-28 DIAGNOSIS — R404 Transient alteration of awareness: Secondary | ICD-10-CM | POA: Diagnosis not present

## 2015-08-28 DIAGNOSIS — R413 Other amnesia: Secondary | ICD-10-CM | POA: Diagnosis not present

## 2015-08-28 DIAGNOSIS — G459 Transient cerebral ischemic attack, unspecified: Secondary | ICD-10-CM

## 2015-08-28 DIAGNOSIS — E538 Deficiency of other specified B group vitamins: Secondary | ICD-10-CM

## 2015-08-28 HISTORY — DX: Transient cerebral ischemic attack, unspecified: G45.9

## 2015-08-28 HISTORY — DX: Hereditary and idiopathic neuropathy, unspecified: G60.9

## 2015-08-28 HISTORY — DX: Transient alteration of awareness: R40.4

## 2015-08-28 NOTE — Progress Notes (Signed)
Reason for visit: Memory disturbance  Referring physician: Dr. Meda Klinefelter is a 82 y.o. male  History of present illness:  Jonathan [name] is an 82 year old right-handed white male with a history of COPD, and a peripheral neuropathy, these issues have been present for 20 years or more. The patient has burning in the feet which may occasionally keep him awake at night. He does have a mild baseline gait disturbance, he denies any recent falls. He takes Lyrica and Cymbalta for his peripheral neuropathy discomfort. He reports that he has had intermittent episodes of feeling cognitively cloudy that may last up to 2 weeks at a time, and may occur on average twice a year. The patient has had these episodes for about 20 years. The patient has atrial fibrillation, on anticoagulation. He denies any numbness or weakness of the face or arms, he denies any problems controlling the bowels, he does have some occasional urinary incontinence. The had a fall associated with head trauma in 2002. He will misplace things about the house at times. He has difficulty performing complex calculations, but in general he can function relatively well. In between the episodes of mild confusion he still feels that his memory is not completely normal. He is sent to this office for an evaluation. He has excessive daytime drowsiness, he indicates that his CPAP has been reevaluated recently, but he oftentimes has to take naps during the day.  Past Medical History  Diagnosis Date  . Bronchitis   . Hypertension   . Gout   . BPH (benign prostatic hyperplasia)   . Spinal compression fracture (HCC) seventh vertebre  . COPD (chronic obstructive pulmonary disease) (HCC)     2 liters O2 HS  . Hypoxia   . Fibromyalgia   . Peripheral neuropathy ([place])   . Osteoarthritis   . GERD (gastroesophageal reflux disease)   . Hypothyroidism   . Insomnia   . Rotator cuff tear, left   . Glaucoma     bilateral eyes  . Fatty tumor      waste and back  . Atrial fibrillation ([place])   . Depression   . Cataract   . Wears glasses   . On home oxygen therapy     at night with cpap  . Sleep apnea     uses cpap-add oxygen at night  . Transient alteration of awareness 08/28/2015  . Hereditary and idiopathic peripheral neuropathy 08/28/2015  . Memory disorder 08/28/2015    Past Surgical History  Procedure Laterality Date  . Tonsil    . Appendectomy  age 4  . Nasal concha bullosa resection  age 58  . Vasectomy  age 47  . Inguinal hernia repair  age 71    rt side  . Cholecystectomy  age 66  . Cataract extraction      bilateral  . Eye surgery    . Prostate surgery    . Shoulder arthroscopy w/ rotator cuff repair Right     early 2000s  . Carpal tunnel release Right     early 2000s  . Foot arthrodesis Right 02/02/2013    Procedure: RIGHT HALLUX METATARSAL PHALANGEAL JOINT ARTHRODESIS ;  Surgeon: Wylene Simmer, MD;  Location: [facility];  Service: Orthopedics;  Laterality: Right;    Family History  Problem Relation Age of Onset  . Coronary artery disease Brother   . Heart disease Father   . Lung cancer Father   . Kidney cancer Father   .  Prostate cancer Father   . Arthritis Mother   . Lung cancer Mother     Social history:  reports that he quit smoking about 40 years ago. His smoking use included Cigarettes. He has a 20 pack-year smoking history. He has never used smokeless tobacco. He reports that he drinks alcohol. He reports that he does not use illicit drugs.  Medications:  Prior to Admission medications   Medication Sig Start Date End Date Taking? Authorizing Provider  acetaminophen (TYLENOL) 500 MG tablet Take 2 tablets (1,000 mg total) by mouth 2 (two) times daily. 11/23/12  Yes Posey Boyer, MD  allopurinol (ZYLOPRIM) 300 MG tablet Take 1 tablet by mouth daily. 10/31/14  Yes Historical Provider, MD  apixaban (ELIQUIS) 5 MG TABS tablet Take 1 tablet (5 mg total) by mouth 2 (two) times daily.  01/08/15  Yes Lelon Perla, MD  brimonidine (ALPHAGAN) 0.15 % ophthalmic solution Place 1 drop into the left eye 2 (two) times daily. 12/06/14  Yes Historical Provider, MD  Calcium Lactate 750 MG TABS Take 1 tablet by mouth 2 (two) times daily.   Yes Historical Provider, MD  colchicine (COLCRYS) 0.6 MG tablet Take 0.6 mg by mouth daily as needed.   Yes Historical Provider, MD  diltiazem (CARDIZEM CD) 180 MG 24 hr capsule Take 1 capsule (180 mg total) by mouth daily. 02/25/12  Yes Posey Boyer, MD  doxazosin (CARDURA) 8 MG tablet Take 1 tablet (8 mg total) by mouth daily. PATIENT NEEDS OFFICE VISIT FOR ADDITIONAL REFILLS 07/15/13  Yes Posey Boyer, MD  DULERA 200-5 MCG/ACT AERO INHALE 2 PUFFS INTO THE LUNGS 2 (TWO) TIMES DAILY. 08/22/14  Yes Collene Gobble, MD  DULoxetine (CYMBALTA) 30 MG capsule Take 60 mg by mouth daily.    Yes Historical Provider, MD  fluticasone (FLONASE) 50 MCG/ACT nasal spray Place 2 sprays into the nose 2 (two) times daily as needed.    Yes Historical Provider, MD  latanoprost (XALATAN) 0.005 % ophthalmic solution Place 1 drop into both eyes at bedtime. 05/02/12  Yes Posey Boyer, MD  levalbuterol [facility] HFA) 45 MCG/ACT inhaler Inhale 1-2 puffs into the lungs every 4 (four) hours as needed for wheezing. 07/06/13  Yes Collene Gobble, MD  levothyroxine (SYNTHROID, LEVOTHROID) 75 MCG tablet Take 1 tablet (75 mcg total) by mouth daily before breakfast. 12/26/12  Yes Posey Boyer, MD  loratadine (CLARITIN) 10 MG tablet Take 10 mg by mouth daily.   Yes Historical Provider, MD  mometasone (NASONEX) 50 MCG/ACT nasal spray Place 2 sprays into the nose daily. 12/06/14  Yes Historical Provider, MD  Multiple Vitamin (MULTIVITAMIN) tablet Take 1 tablet by mouth daily.   Yes Historical Provider, MD  nitroGLYCERIN (NITROSTAT) 0.4 MG SL tablet Place 1 tablet (0.4 mg total) under the tongue every 5 (five) minutes as needed for chest pain. 12/13/14  Yes Rhonda G Barrett, PA-C  omeprazole  (PRILOSEC) 20 MG capsule Take 20 mg by mouth daily.   Yes Historical Provider, MD  OXYCODONE HCL PO Take 10 mg by mouth as needed.   Yes Historical Provider, MD  pregabalin (LYRICA) 100 MG capsule 100 mg. Take  2 every evening for peripheral neuropathy 11/23/12  Yes Posey Boyer, MD  tadalafil (CIALIS) 20 MG tablet Take 20 mg by mouth daily as needed. For erectile dysfunction   Yes Historical Provider, MD  zaleplon (SONATA) 10 MG capsule Take 10 mg by mouth at bedtime as needed for sleep.  Yes Historical Provider, MD     No Known Allergies  ROS:  Out of a complete 14 system review of symptoms, the patient complains only of the following symptoms, and all other reviewed systems are negative.  Fatigue Swelling in the legs Hearing loss, ringing in the ears Skin rash, itching Blurred vision, double vision Shortness of breath, wheezing, snoring Incontinence of bladder Easy bruising, easy bleeding Joint pain, joint swelling, muscle cramps, aching muscles Runny nose Memory loss, confusion, weakness Depression, decreased energy Sleepiness, snoring, restless legs  Blood pressure 147/70, pulse 93, height 5\' 7"  (1.702 m), weight 214 lb (97.07 kg).  Physical Exam  General: The patient is alert and cooperative at the time of the examination. The patient is markedly obese.  Eyes: Pupils are equal, round, and reactive to light. Discs are flat bilaterally.  Neck: The neck is supple, no carotid bruits are noted.  Respiratory: The respiratory examination is clear.  Cardiovascular: The cardiovascular examination reveals a regular rate and rhythm, no obvious murmurs or rubs are noted.  Skin: Extremities are with 2+ edema below the knees bilaterally.  Neurologic Exam  Mental status: The patient is alert and oriented x 3 at the time of the examination. The patient has apparent normal recent and remote memory, with an apparently normal attention span and concentration ability.The Mini-Mental  Status Examination done today shows a total score of 29/30. The patient is able to name 15 animals in 30 seconds.  Cranial nerves: Facial symmetry is present. There is good sensation of the face to pinprick and soft touch bilaterally. The strength of the facial muscles and the muscles to head turning and shoulder shrug are normal bilaterally. Speech is well enunciated, no aphasia or dysarthria is noted. Extraocular movements are full. Visual fields are full. The tongue is midline, and the patient has symmetric elevation of the soft palate. No obvious hearing deficits are noted.  Motor: The motor testing reveals 5 over 5 strength of all 4 extremities. Good symmetric motor tone is noted throughout.  Sensory: Sensory testing is intact to pinprick, soft touch, vibration sensation, and position sense on the upper extremities. With the lower extremities, there is a stocking pattern pinprick sensory deficit up to the knees bilaterally. The patient has minimal impairment of vibration and position sense in both feet. No evidence of extinction is noted.  Coordination: Cerebellar testing reveals good finger-nose-finger and heel-to-shin bilaterally.  Gait and station: Gait is normal. Tandem gait is slightly unsteady. Romberg is negative. No drift is seen.  Reflexes: Deep tendon reflexes are symmetric, but are depressed bilaterally. Toes are downgoing bilaterally.   Assessment/Plan:  1. Peripheral neuropathy  2. Minimum cognitive impairment  The patient reports some generalized fatigue, excessive daytime drowsiness associated with this obstructive sleep apnea. The patient has some mild cognitive issues at all times, and he has had intermittent episodes of cognitive clouding that have occurred over 20 years. The patient will be sent for blood work today, he will have MRI evaluation of the brain done. He will follow-up in 6 months, we will follow the cognitive issues over time.  Jill Alexanders MD 08/28/2015  7:06 PM  [facility] [address] [place], [place] [address]  Phone [phone] Fax [phone]

## 2015-08-29 LAB — VITAMIN B12: Vitamin B-12: 615 pg/mL (ref 211–946)

## 2015-08-29 LAB — RPR: RPR Ser Ql: NONREACTIVE

## 2015-08-29 LAB — SEDIMENTATION RATE: Sed Rate: 2 mm/hr (ref 0–30)

## 2015-09-05 ENCOUNTER — Other Ambulatory Visit: Payer: Self-pay | Admitting: Neurology

## 2015-09-07 ENCOUNTER — Inpatient Hospital Stay: Admission: RE | Admit: 2015-09-07 | Payer: Medicare Other | Source: Ambulatory Visit

## 2015-10-11 DIAGNOSIS — G4733 Obstructive sleep apnea (adult) (pediatric): Secondary | ICD-10-CM | POA: Diagnosis not present

## 2015-10-11 DIAGNOSIS — H409 Unspecified glaucoma: Secondary | ICD-10-CM | POA: Diagnosis not present

## 2015-10-11 DIAGNOSIS — J189 Pneumonia, unspecified organism: Secondary | ICD-10-CM | POA: Diagnosis not present

## 2015-10-11 DIAGNOSIS — Z7901 Long term (current) use of anticoagulants: Secondary | ICD-10-CM | POA: Diagnosis not present

## 2015-10-11 DIAGNOSIS — R109 Unspecified abdominal pain: Secondary | ICD-10-CM | POA: Diagnosis not present

## 2015-10-11 DIAGNOSIS — Z6832 Body mass index (BMI) 32.0-32.9, adult: Secondary | ICD-10-CM | POA: Diagnosis not present

## 2015-10-11 DIAGNOSIS — J449 Chronic obstructive pulmonary disease, unspecified: Secondary | ICD-10-CM | POA: Diagnosis not present

## 2015-10-11 DIAGNOSIS — J984 Other disorders of lung: Secondary | ICD-10-CM | POA: Diagnosis not present

## 2015-10-11 DIAGNOSIS — N400 Enlarged prostate without lower urinary tract symptoms: Secondary | ICD-10-CM | POA: Diagnosis not present

## 2015-10-11 DIAGNOSIS — N200 Calculus of kidney: Secondary | ICD-10-CM | POA: Diagnosis not present

## 2015-10-11 DIAGNOSIS — E039 Hypothyroidism, unspecified: Secondary | ICD-10-CM | POA: Diagnosis not present

## 2015-10-11 DIAGNOSIS — E669 Obesity, unspecified: Secondary | ICD-10-CM | POA: Diagnosis not present

## 2015-10-11 DIAGNOSIS — M797 Fibromyalgia: Secondary | ICD-10-CM | POA: Diagnosis not present

## 2015-10-11 DIAGNOSIS — M1990 Unspecified osteoarthritis, unspecified site: Secondary | ICD-10-CM | POA: Diagnosis not present

## 2015-10-11 DIAGNOSIS — Z87891 Personal history of nicotine dependence: Secondary | ICD-10-CM | POA: Diagnosis not present

## 2015-10-11 DIAGNOSIS — I4891 Unspecified atrial fibrillation: Secondary | ICD-10-CM | POA: Diagnosis not present

## 2015-10-17 ENCOUNTER — Other Ambulatory Visit: Payer: Self-pay | Admitting: Cardiology

## 2015-10-18 DIAGNOSIS — J189 Pneumonia, unspecified organism: Secondary | ICD-10-CM | POA: Diagnosis not present

## 2015-10-24 ENCOUNTER — Other Ambulatory Visit: Payer: Self-pay | Admitting: Emergency Medicine

## 2015-10-29 ENCOUNTER — Ambulatory Visit: Payer: Medicare Other | Admitting: Emergency Medicine

## 2015-11-18 DIAGNOSIS — H04123 Dry eye syndrome of bilateral lacrimal glands: Secondary | ICD-10-CM | POA: Diagnosis not present

## 2015-11-18 DIAGNOSIS — H43813 Vitreous degeneration, bilateral: Secondary | ICD-10-CM | POA: Diagnosis not present

## 2015-11-18 DIAGNOSIS — H401131 Primary open-angle glaucoma, bilateral, mild stage: Secondary | ICD-10-CM | POA: Diagnosis not present

## 2016-01-02 DIAGNOSIS — E785 Hyperlipidemia, unspecified: Secondary | ICD-10-CM | POA: Diagnosis not present

## 2016-01-02 DIAGNOSIS — M810 Age-related osteoporosis without current pathological fracture: Secondary | ICD-10-CM | POA: Diagnosis not present

## 2016-01-02 DIAGNOSIS — Z1389 Encounter for screening for other disorder: Secondary | ICD-10-CM | POA: Diagnosis not present

## 2016-01-02 DIAGNOSIS — Z0000 Encounter for general adult medical examination without abnormal findings: Secondary | ICD-10-CM | POA: Diagnosis not present

## 2016-01-03 DIAGNOSIS — R7309 Other abnormal glucose: Secondary | ICD-10-CM | POA: Diagnosis not present

## 2016-01-03 DIAGNOSIS — E785 Hyperlipidemia, unspecified: Secondary | ICD-10-CM | POA: Diagnosis not present

## 2016-01-03 DIAGNOSIS — Z125 Encounter for screening for malignant neoplasm of prostate: Secondary | ICD-10-CM | POA: Diagnosis not present

## 2016-01-03 DIAGNOSIS — M109 Gout, unspecified: Secondary | ICD-10-CM | POA: Diagnosis not present

## 2016-01-03 DIAGNOSIS — I4891 Unspecified atrial fibrillation: Secondary | ICD-10-CM | POA: Diagnosis not present

## 2016-01-03 DIAGNOSIS — E039 Hypothyroidism, unspecified: Secondary | ICD-10-CM | POA: Diagnosis not present

## 2016-01-03 DIAGNOSIS — M810 Age-related osteoporosis without current pathological fracture: Secondary | ICD-10-CM | POA: Diagnosis not present

## 2016-01-09 DIAGNOSIS — J449 Chronic obstructive pulmonary disease, unspecified: Secondary | ICD-10-CM | POA: Diagnosis not present

## 2016-01-09 DIAGNOSIS — F17211 Nicotine dependence, cigarettes, in remission: Secondary | ICD-10-CM | POA: Diagnosis not present

## 2016-01-09 DIAGNOSIS — I4891 Unspecified atrial fibrillation: Secondary | ICD-10-CM | POA: Diagnosis not present

## 2016-01-09 DIAGNOSIS — D696 Thrombocytopenia, unspecified: Secondary | ICD-10-CM | POA: Diagnosis not present

## 2016-01-09 DIAGNOSIS — F339 Major depressive disorder, recurrent, unspecified: Secondary | ICD-10-CM | POA: Diagnosis not present

## 2016-02-28 DIAGNOSIS — H401131 Primary open-angle glaucoma, bilateral, mild stage: Secondary | ICD-10-CM | POA: Diagnosis not present

## 2016-02-28 DIAGNOSIS — H04123 Dry eye syndrome of bilateral lacrimal glands: Secondary | ICD-10-CM | POA: Diagnosis not present

## 2016-03-05 ENCOUNTER — Ambulatory Visit: Payer: Medicare Other | Admitting: Neurology

## 2016-03-05 ENCOUNTER — Ambulatory Visit
Admission: RE | Admit: 2016-03-05 | Discharge: 2016-03-05 | Disposition: A | Discharge status: Home / Self Care | Payer: Medicare Other | Source: Ambulatory Visit | Attending: Neurology | Admitting: Neurology

## 2016-03-05 DIAGNOSIS — G609 Hereditary and idiopathic neuropathy, unspecified: Secondary | ICD-10-CM | POA: Diagnosis not present

## 2016-03-05 DIAGNOSIS — R404 Transient alteration of awareness: Secondary | ICD-10-CM

## 2016-03-05 DIAGNOSIS — E538 Deficiency of other specified B group vitamins: Secondary | ICD-10-CM

## 2016-03-05 DIAGNOSIS — R413 Other amnesia: Secondary | ICD-10-CM

## 2016-03-06 ENCOUNTER — Telehealth: Payer: Self-pay | Admitting: Neurology

## 2016-03-06 NOTE — Telephone Encounter (Signed)
  I called patient. MRI the brain was relatively unremarkable, no significant small vessel ischemic changes, minimal atrophy. We will follow the memory issues over time.  MRI brain 03/05/16:  IMPRESSION:  Mildly abnormal MRI brain (without) demonstrating: 1. Mild generalized atrophy. 2. Mild periventricular and subcortical small vessel ischemic disease. 3. No acute findings.

## 2016-03-07 ENCOUNTER — Emergency Department (HOSPITAL_COMMUNITY): Payer: Medicare Other

## 2016-03-07 ENCOUNTER — Inpatient Hospital Stay (HOSPITAL_COMMUNITY)
Admission: EM | Admit: 2016-03-07 | Discharge: 2016-03-13 | DRG: 871 | Disposition: A | Discharge status: Home / Self Care | Payer: Medicare Other | Attending: Internal Medicine | Admitting: Internal Medicine

## 2016-03-07 ENCOUNTER — Encounter (HOSPITAL_COMMUNITY): Payer: Self-pay

## 2016-03-07 DIAGNOSIS — Z9981 Dependence on supplemental oxygen: Secondary | ICD-10-CM | POA: Diagnosis not present

## 2016-03-07 DIAGNOSIS — J449 Chronic obstructive pulmonary disease, unspecified: Secondary | ICD-10-CM | POA: Diagnosis present

## 2016-03-07 DIAGNOSIS — I10 Essential (primary) hypertension: Secondary | ICD-10-CM | POA: Diagnosis not present

## 2016-03-07 DIAGNOSIS — N4889 Other specified disorders of penis: Secondary | ICD-10-CM | POA: Diagnosis not present

## 2016-03-07 DIAGNOSIS — I480 Paroxysmal atrial fibrillation: Secondary | ICD-10-CM | POA: Diagnosis present

## 2016-03-07 DIAGNOSIS — B356 Tinea cruris: Secondary | ICD-10-CM | POA: Diagnosis present

## 2016-03-07 DIAGNOSIS — Z8701 Personal history of pneumonia (recurrent): Secondary | ICD-10-CM | POA: Diagnosis not present

## 2016-03-07 DIAGNOSIS — A4102 Sepsis due to Methicillin resistant Staphylococcus aureus: Secondary | ICD-10-CM | POA: Diagnosis not present

## 2016-03-07 DIAGNOSIS — L03315 Cellulitis of perineum: Secondary | ICD-10-CM

## 2016-03-07 DIAGNOSIS — Z8261 Family history of arthritis: Secondary | ICD-10-CM

## 2016-03-07 DIAGNOSIS — M797 Fibromyalgia: Secondary | ICD-10-CM | POA: Diagnosis present

## 2016-03-07 DIAGNOSIS — N401 Enlarged prostate with lower urinary tract symptoms: Secondary | ICD-10-CM | POA: Diagnosis present

## 2016-03-07 DIAGNOSIS — Z79899 Other long term (current) drug therapy: Secondary | ICD-10-CM | POA: Diagnosis not present

## 2016-03-07 DIAGNOSIS — E039 Hypothyroidism, unspecified: Secondary | ICD-10-CM | POA: Diagnosis present

## 2016-03-07 DIAGNOSIS — G4733 Obstructive sleep apnea (adult) (pediatric): Secondary | ICD-10-CM | POA: Diagnosis present

## 2016-03-07 DIAGNOSIS — Z7901 Long term (current) use of anticoagulants: Secondary | ICD-10-CM | POA: Diagnosis not present

## 2016-03-07 DIAGNOSIS — R911 Solitary pulmonary nodule: Secondary | ICD-10-CM | POA: Diagnosis present

## 2016-03-07 DIAGNOSIS — J189 Pneumonia, unspecified organism: Secondary | ICD-10-CM | POA: Diagnosis present

## 2016-03-07 DIAGNOSIS — D696 Thrombocytopenia, unspecified: Secondary | ICD-10-CM | POA: Diagnosis present

## 2016-03-07 DIAGNOSIS — Z87891 Personal history of nicotine dependence: Secondary | ICD-10-CM

## 2016-03-07 DIAGNOSIS — Z8042 Family history of malignant neoplasm of prostate: Secondary | ICD-10-CM

## 2016-03-07 DIAGNOSIS — Z8051 Family history of malignant neoplasm of kidney: Secondary | ICD-10-CM

## 2016-03-07 DIAGNOSIS — K219 Gastro-esophageal reflux disease without esophagitis: Secondary | ICD-10-CM | POA: Diagnosis present

## 2016-03-07 DIAGNOSIS — I4891 Unspecified atrial fibrillation: Secondary | ICD-10-CM | POA: Diagnosis not present

## 2016-03-07 DIAGNOSIS — A419 Sepsis, unspecified organism: Principal | ICD-10-CM | POA: Diagnosis present

## 2016-03-07 DIAGNOSIS — I4819 Other persistent atrial fibrillation: Secondary | ICD-10-CM | POA: Diagnosis present

## 2016-03-07 DIAGNOSIS — Z8249 Family history of ischemic heart disease and other diseases of the circulatory system: Secondary | ICD-10-CM

## 2016-03-07 DIAGNOSIS — H409 Unspecified glaucoma: Secondary | ICD-10-CM | POA: Diagnosis present

## 2016-03-07 DIAGNOSIS — M109 Gout, unspecified: Secondary | ICD-10-CM | POA: Diagnosis present

## 2016-03-07 DIAGNOSIS — Z7983 Long term (current) use of bisphosphonates: Secondary | ICD-10-CM | POA: Diagnosis not present

## 2016-03-07 DIAGNOSIS — N481 Balanitis: Secondary | ICD-10-CM | POA: Diagnosis not present

## 2016-03-07 DIAGNOSIS — R404 Transient alteration of awareness: Secondary | ICD-10-CM | POA: Diagnosis not present

## 2016-03-07 DIAGNOSIS — J9809 Other diseases of bronchus, not elsewhere classified: Secondary | ICD-10-CM | POA: Diagnosis not present

## 2016-03-07 DIAGNOSIS — E119 Type 2 diabetes mellitus without complications: Secondary | ICD-10-CM | POA: Diagnosis present

## 2016-03-07 DIAGNOSIS — J440 Chronic obstructive pulmonary disease with acute lower respiratory infection: Secondary | ICD-10-CM | POA: Diagnosis present

## 2016-03-07 DIAGNOSIS — R309 Painful micturition, unspecified: Secondary | ICD-10-CM | POA: Diagnosis not present

## 2016-03-07 DIAGNOSIS — R531 Weakness: Secondary | ICD-10-CM | POA: Diagnosis not present

## 2016-03-07 DIAGNOSIS — N4822 Cellulitis of corpus cavernosum and penis: Secondary | ICD-10-CM | POA: Diagnosis not present

## 2016-03-07 DIAGNOSIS — Z801 Family history of malignant neoplasm of trachea, bronchus and lung: Secondary | ICD-10-CM

## 2016-03-07 HISTORY — DX: Type 2 diabetes mellitus without complications: E11.9

## 2016-03-07 LAB — CBC WITH DIFFERENTIAL/PLATELET
Basophils Absolute: 0 10*3/uL (ref 0.0–0.1)
Basophils Relative: 0 %
Eosinophils Absolute: 0 10*3/uL (ref 0.0–0.7)
Eosinophils Relative: 0 %
HCT: 45.3 % (ref 39.0–52.0)
Hemoglobin: 14.5 g/dL (ref 13.0–17.0)
Lymphocytes Relative: 5 %
Lymphs Abs: 0.8 10*3/uL (ref 0.7–4.0)
MCH: 30 pg (ref 26.0–34.0)
MCHC: 32 g/dL (ref 30.0–36.0)
MCV: 93.6 fL (ref 78.0–100.0)
Monocytes Absolute: 2.5 10*3/uL — ABNORMAL HIGH (ref 0.1–1.0)
Monocytes Relative: 14 %
Neutro Abs: 14.1 10*3/uL — ABNORMAL HIGH (ref 1.7–7.7)
Neutrophils Relative %: 81 %
Platelets: 144 10*3/uL — ABNORMAL LOW (ref 150–400)
RBC: 4.84 MIL/uL (ref 4.22–5.81)
RDW: 14 % (ref 11.5–15.5)
WBC: 17.4 10*3/uL — ABNORMAL HIGH (ref 4.0–10.5)

## 2016-03-07 LAB — URINALYSIS, ROUTINE W REFLEX MICROSCOPIC
Bilirubin Urine: NEGATIVE
Glucose, UA: NEGATIVE mg/dL
Hgb urine dipstick: NEGATIVE
Ketones, ur: 15 mg/dL — AB
Leukocytes, UA: NEGATIVE
Nitrite: NEGATIVE
Protein, ur: 100 mg/dL — AB
Specific Gravity, Urine: 1.025 (ref 1.005–1.030)
pH: 6 (ref 5.0–8.0)

## 2016-03-07 LAB — COMPREHENSIVE METABOLIC PANEL
ALT: 20 U/L (ref 17–63)
AST: 22 U/L (ref 15–41)
Albumin: 4 g/dL (ref 3.5–5.0)
Alkaline Phosphatase: 93 U/L (ref 38–126)
Anion gap: 9 (ref 5–15)
BUN: 13 mg/dL (ref 6–20)
CO2: 24 mmol/L (ref 22–32)
Calcium: 9 mg/dL (ref 8.9–10.3)
Chloride: 104 mmol/L (ref 101–111)
Creatinine, Ser: 1.1 mg/dL (ref 0.61–1.24)
GFR calc Af Amer: >60 mL/min (ref >60)
GFR calc non Af Amer: >60 mL/min (ref >60)
Glucose, Bld: 186 mg/dL — ABNORMAL HIGH (ref 65–99)
Potassium: 3.7 mmol/L (ref 3.5–5.1)
Sodium: 137 mmol/L (ref 135–145)
Total Bilirubin: 0.7 mg/dL (ref 0.3–1.2)
Total Protein: 6.7 g/dL (ref 6.5–8.1)

## 2016-03-07 LAB — I-STAT CG4 LACTIC ACID, ED
Lactic Acid, Venous: 2.4 mmol/L (ref 0.5–1.9)
Lactic Acid, Venous: 2.5 mmol/L (ref 0.5–1.9)

## 2016-03-07 LAB — URINE MICROSCOPIC-ADD ON
Bacteria, UA: NONE SEEN
RBC / HPF: NONE SEEN RBC/hpf (ref 0–5)
WBC, UA: NONE SEEN WBC/hpf (ref 0–5)

## 2016-03-07 MED ORDER — SODIUM CHLORIDE 0.9 % IV BOLUS (SEPSIS)
1000.0000 mL | Freq: Once | INTRAVENOUS | Status: AC
  Administered 2016-03-07: 1000 mL via INTRAVENOUS

## 2016-03-07 MED ORDER — IOPAMIDOL (ISOVUE-300) INJECTION 61%
INTRAVENOUS | Status: AC
  Administered 2016-03-08: 75 mL
  Filled 2016-03-07: qty 75

## 2016-03-07 MED ORDER — PIPERACILLIN-TAZOBACTAM 3.375 G IVPB 30 MIN
3.3750 g | Freq: Once | INTRAVENOUS | Status: AC
  Administered 2016-03-07: 3.375 g via INTRAVENOUS
  Filled 2016-03-07: qty 50

## 2016-03-07 MED ORDER — PIPERACILLIN-TAZOBACTAM 4.5 G IVPB
4.5000 g | Freq: Once | INTRAVENOUS | Status: DC
  Filled 2016-03-07: qty 100

## 2016-03-07 MED ORDER — ACETAMINOPHEN 500 MG PO TABS
1000.0000 mg | ORAL_TABLET | Freq: Once | ORAL | Status: AC
  Administered 2016-03-07: 1000 mg via ORAL
  Filled 2016-03-07: qty 2

## 2016-03-07 MED ORDER — PIPERACILLIN-TAZOBACTAM 4.5 G IVPB: 4.5000 g | Freq: Once | INTRAVENOUS | Status: DC

## 2016-03-07 MED ORDER — VANCOMYCIN HCL IN DEXTROSE 1-5 GM/200ML-% IV SOLN
1000.0000 mg | Freq: Once | INTRAVENOUS | Status: AC
  Administered 2016-03-07: 1000 mg via INTRAVENOUS
  Filled 2016-03-07: qty 200

## 2016-03-07 NOTE — ED Triage Notes (Signed)
EMS was called to the patients job due to complaints of generalized weakness.  Per EMS patient was lethargic but A/OX4.  [place] screen was negative. EMS reports ongoing nausea but no vomiting. Patient was given 4mg  zofran in route.  VS 154/74, 100 HR, 95% on 3L.  EMS reports initial O2 sat was 90% on room air.

## 2016-03-07 NOTE — ED Provider Notes (Signed)
[facility] DEPT Provider Note   CSN: [id number] Arrival date & time: 03/07/16  1956     History   Chief Complaint Chief Complaint  Patient presents with  . Code Sepsis    HPI Jonathan [name] is a 82 y.o. male.  HPI 82 year old male who presents with generalized weakness. He has a history of atrial fibrillation on eliquis, COPD on 2 L home oxygen, hypertension, CK D. States feeling unwell over the course of the past month, and has had outpatient workup with neurologist for generalized weakness. States that over the past week he has had worsening weakness, fatigue and lethargy. Also having some difficulty urinating and dysuria. No fevers or chills, vomiting, diarrhea, abdominal pain, cough, difficulty breathing, chest pain, congestion, sore throat or runny nose. No known sick contacts. EMS was called to patient's employment today given his generalized weakness and lethargy. On arrival, patient is febrile and tachycardic. Past Medical History:  Diagnosis Date  . Atrial fibrillation ([place])   . BPH (benign prostatic hyperplasia)   . Bronchitis   . Cataract   . COPD (chronic obstructive pulmonary disease) (HCC)    2 liters O2 HS  . Depression   . Fatty tumor    waste and back  . Fibromyalgia   . GERD (gastroesophageal reflux disease)   . Glaucoma    bilateral eyes  . Gout   . Hereditary and idiopathic peripheral neuropathy 08/28/2015  . Hypertension   . Hypothyroidism   . Hypoxia   . Insomnia   . Memory disorder 08/28/2015  . On home oxygen therapy    at night with cpap  . Osteoarthritis   . Peripheral neuropathy ([place])   . Rotator cuff tear, left   . Sleep apnea    uses cpap-add oxygen at night  . Spinal compression fracture (HCC) seventh vertebre  . Transient alteration of awareness 08/28/2015  . Wears glasses     Patient Active Problem List   Diagnosis Date Noted  . Sepsis ([place]) 03/07/2016  . Transient alteration of awareness 08/28/2015  . Hereditary and  idiopathic peripheral neuropathy 08/28/2015  . Memory disorder 08/28/2015  . Fatigue 01/08/2015  . Bruit 08/15/2013  . Hypertension 12/04/2011  . Allergic rhinitis, seasonal 12/04/2011  . Sleep apnea, obstructive 08/25/2011  . Atrial fibrillation with RVR ([place]) 07/20/2011  . Pneumonia 07/20/2011  . Chest pain 07/20/2011  . CKD (chronic kidney disease) 07/20/2011  . COPD (chronic obstructive pulmonary disease) ([place]) 07/18/2011  . Bronchitis 07/18/2011    Past Surgical History:  Procedure Laterality Date  . APPENDECTOMY  age 54  . CARPAL TUNNEL RELEASE Right    early 2000s  . CATARACT EXTRACTION     bilateral  . CHOLECYSTECTOMY  age 41  . EYE SURGERY    . FOOT ARTHRODESIS Right 02/02/2013   Procedure: RIGHT HALLUX METATARSAL PHALANGEAL JOINT ARTHRODESIS ;  Surgeon: Wylene Simmer, MD;  Location: [facility];  Service: Orthopedics;  Laterality: Right;  . INGUINAL HERNIA REPAIR  age 23   rt side  . NASAL CONCHA BULLOSA RESECTION  age 57  . PROSTATE SURGERY    . SHOULDER ARTHROSCOPY W/ ROTATOR CUFF REPAIR Right    early 2000s  . tonsil    . VASECTOMY  age 59       Home Medications    Prior to Admission medications   Medication Sig Start Date End Date Taking? Authorizing Provider  acetaminophen (TYLENOL) 500 MG tablet Take 2 tablets (1,000 mg total) by mouth 2 (two)  times daily. 11/23/12  Yes Posey Boyer, MD  alendronate (FOSAMAX) 70 MG tablet Take 70 mg by mouth every Monday. 01/09/16  Yes Historical Provider, MD  allopurinol (ZYLOPRIM) 300 MG tablet Take 300 mg by mouth every evening.  10/31/14  Yes Historical Provider, MD  brimonidine (ALPHAGAN) 0.15 % ophthalmic solution Place 1 drop into both eyes 2 (two) times daily.  12/06/14  Yes Historical Provider, MD  Calcium Lactate 750 MG TABS Take 1 tablet by mouth daily.    Yes Historical Provider, MD  colchicine (COLCRYS) 0.6 MG tablet Take 0.6 mg by mouth daily as needed (flareups).    Yes Historical Provider, MD    diltiazem (CARDIZEM CD) 180 MG 24 hr capsule Take 1 capsule (180 mg total) by mouth daily. 02/25/12  Yes Posey Boyer, MD  doxazosin (CARDURA) 8 MG tablet Take 1 tablet (8 mg total) by mouth daily. PATIENT NEEDS OFFICE VISIT FOR ADDITIONAL REFILLS 07/15/13  Yes Posey Boyer, MD  DULERA 200-5 MCG/ACT AERO INHALE 2 PUFFS INTO THE LUNGS 2 (TWO) TIMES DAILY. 10/25/15  Yes Collene Gobble, MD  DULoxetine (CYMBALTA) 30 MG capsule Take 30 mg by mouth daily.    Yes Historical Provider, MD  ELIQUIS 5 MG TABS tablet TAKE 1 TABLET (5 MG TOTAL) BY MOUTH 2 (TWO) TIMES DAILY. 10/17/15  Yes Lelon Perla, MD  fluticasone (FLONASE) 50 MCG/ACT nasal spray Place 2 sprays into the nose 2 (two) times daily as needed for allergies.    Yes Historical Provider, MD  latanoprost (XALATAN) 0.005 % ophthalmic solution Place 1 drop into both eyes at bedtime. 05/02/12  Yes Posey Boyer, MD  levalbuterol [facility] HFA) 45 MCG/ACT inhaler Inhale 1-2 puffs into the lungs every 4 (four) hours as needed for wheezing. 07/06/13  Yes Collene Gobble, MD  levothyroxine (SYNTHROID, LEVOTHROID) 75 MCG tablet Take 1 tablet (75 mcg total) by mouth daily before breakfast. 12/26/12  Yes Posey Boyer, MD  loratadine (CLARITIN) 10 MG tablet Take 10 mg by mouth daily.   Yes Historical Provider, MD  magnesium oxide (MAG-OX) 400 MG tablet Take 400 mg by mouth daily.   Yes Historical Provider, MD  Melatonin 5 MG TABS Take 5 mg by mouth at bedtime.   Yes Historical Provider, MD  mometasone (NASONEX) 50 MCG/ACT nasal spray Place 2 sprays into the nose daily. 12/06/14  Yes Historical Provider, MD  Multiple Vitamin (MULTIVITAMIN) tablet Take 1 tablet by mouth daily.   Yes Historical Provider, MD  nitroGLYCERIN (NITROSTAT) 0.4 MG SL tablet Place 1 tablet (0.4 mg total) under the tongue every 5 (five) minutes as needed for chest pain. 12/13/14  Yes Rhonda G Barrett, PA-C  NON FORMULARY Inhale 2 L into the lungs at bedtime. CPAP with O2 2L   Yes Historical  Provider, MD  omeprazole (PRILOSEC) 20 MG capsule Take 20 mg by mouth daily.   Yes Historical Provider, MD  potassium gluconate (EQL POTASSIUM GLUCONATE) 595 (99 K) MG TABS tablet Take 595 mg by mouth daily.   Yes Historical Provider, MD  pregabalin (LYRICA) 100 MG capsule Take 100 mg by mouth at bedtime.  11/23/12  Yes Posey Boyer, MD  tadalafil (CIALIS) 20 MG tablet Take 20 mg by mouth daily as needed. For erectile dysfunction   Yes Historical Provider, MD  zaleplon (SONATA) 10 MG capsule Take 10 mg by mouth at bedtime.    Yes Historical Provider, MD    Family History Family History  Problem Relation  Age of Onset  . Coronary artery disease Brother   . Heart disease Father   . Lung cancer Father   . Kidney cancer Father   . Prostate cancer Father   . Arthritis Mother   . Lung cancer Mother     Social History Social History  Substance Use Topics  . Smoking status: Former Smoker    Packs/day: 2.00    Years: 10.00    Types: Cigarettes    Quit date: 07/18/1975  . Smokeless tobacco: Never Used  . Alcohol use Yes     Comment: 1-2 drinks/day     Allergies   Review of patient's allergies indicates no known allergies.   Review of Systems Review of Systems 10/14 systems reviewed and are negative other than those stated in the HPI   Physical Exam Updated Vital Signs BP 134/64   Pulse 87   Temp 102.1 F (38.9 C) (Oral)   Resp 21   Ht 5\' 7"  (1.702 m)   Wt 210 lb (95.3 kg)   SpO2 97%   BMI 32.89 kg/m   Physical Exam Physical Exam  Nursing note and vitals reviewed. Constitutional: Listless, non-toxic but appears ill Head: Normocephalic and atraumatic.  Mouth/Throat: Oropharynx is clear and dry.  Neck: Normal range of motion. Neck supple.  Cardiovascular: Tachycardic rate and regular rhythm.   Pulmonary/Chest: Effort normal and breath sounds normal.  Abdominal: Soft. Moderate distension, There is no tenderness. There is no rebound and no guarding.  Musculoskeletal:  Normal range of motion.  Neurological: Alert, no facial droop, fluent speech, moves all extremities symmetrically Skin: Skin is warm and dry.  Psychiatric: Cooperative   ED Treatments / Results  Labs (all labs ordered are listed, but only abnormal results are displayed) Labs Reviewed  COMPREHENSIVE METABOLIC PANEL - Abnormal; Notable for the following:       Result Value   Glucose, Bld 186 (*)    All other components within normal limits  CBC WITH DIFFERENTIAL/PLATELET - Abnormal; Notable for the following:    WBC 17.4 (*)    Platelets 144 (*)    Neutro Abs 14.1 (*)    Monocytes Absolute 2.5 (*)    All other components within normal limits  URINALYSIS, ROUTINE W REFLEX MICROSCOPIC (NOT AT [facility]) - Abnormal; Notable for the following:    Ketones, ur 15 (*)    Protein, ur 100 (*)    All other components within normal limits  URINE MICROSCOPIC-ADD ON - Abnormal; Notable for the following:    Squamous Epithelial / LPF 0-5 (*)    Casts GRANULAR CAST (*)    All other components within normal limits  I-STAT CG4 LACTIC ACID, ED - Abnormal; Notable for the following:    Lactic Acid, Venous 2.40 (*)    All other components within normal limits  CULTURE, BLOOD (ROUTINE X 2)  CULTURE, BLOOD (ROUTINE X 2)  URINE CULTURE  I-STAT CG4 LACTIC ACID, ED    EKG  EKG Interpretation None       Radiology Dg Chest 2 View  Result Date: 03/07/2016 CLINICAL DATA:  Generalized weakness. History of atrial fibrillation. EXAM: CHEST  2 VIEW COMPARISON:  June 25, 2015 FINDINGS: No pneumothorax. The lateral view is markedly limited due to positioning. Possible opacity in the left lung base. The heart size is normal. The mediastinum is prominent compared to the previous study. Whether this is a true finding or due to positioning is unclear. If there is concern, a CT scan for better  position chest x-ray could better evaluate. The right lung is clear. Mild atelectasis in the right base. No other acute  abnormalities. IMPRESSION: The study is limited due to positioning, particularly the lateral view. Opacity is not excluded in the left base. The mediastinum is more prominent in the interval. Whether this is a true finding or due to positioning is unclear. Recommend either a better position chest x-ray when the patient is able or a CT scan for better evaluation. Electronically Signed   By: Dorise Bullion III M.D   On: 03/07/2016 20:55    Procedures Procedures (including critical care time) CRITICAL CARE Performed by: Forde Dandy   Total critical care time: 35 minutes  Critical care time was exclusive of separately billable procedures and treating other patients.  Critical care was necessary to treat or prevent imminent or life-threatening deterioration.  Critical care was time spent personally by me on the following activities: development of treatment plan with patient and/or surrogate as well as nursing, discussions with consultants, evaluation of patient's response to treatment, examination of patient, obtaining history from patient or surrogate, ordering and performing treatments and interventions, ordering and review of laboratory studies, ordering and review of radiographic studies, pulse oximetry and re-evaluation of patient's condition.  Medications Ordered in ED Medications  sodium chloride 0.9 % bolus 1,000 mL (0 mLs Intravenous Stopped 03/07/16 2116)    And  sodium chloride 0.9 % bolus 1,000 mL (0 mLs Intravenous Stopped 03/07/16 2116)    And  sodium chloride 0.9 % bolus 1,000 mL (0 mLs Intravenous Stopped 03/07/16 2216)  vancomycin (VANCOCIN) IVPB 1000 mg/200 mL premix (0 mg Intravenous Stopped 03/07/16 2248)  acetaminophen (TYLENOL) tablet 1,000 mg (1,000 mg Oral Given 03/07/16 2152)  piperacillin-tazobactam (ZOSYN) IVPB 3.375 g (0 g Intravenous Stopped 03/07/16 2218)     Initial Impression / Assessment and Plan / ED Course  I have reviewed the triage vital signs and the  nursing notes.  Pertinent labs & imaging results that were available during my care of the patient were reviewed by me and considered in my medical decision making (see chart for details).  Clinical Course    Patient presenting as code sepsis. He is febrile, tachycardic, but normotensive and in no respiratory distress. Appears listless, but answering questions appropriately and grossly neurologically intact. With unremarkable cardiopulmonary exam, benign abdomen, no major rashes or evidence of cellulitis. UA without infection, and chest x-ray, poor quality and unable to truly evaluate for pneumonia. He does have leukocytosis of 17 with elevated lactate above 2. No major endorgan damage. Empirically started on 3 L of IV fluids per protocol, and I given vancomycin and Zosyn with blood cultures and urine cultures pending. Plan for CT of the chest to evaluate for potential pneumonia. Discussed with Dr. Olevia Bowens from hospitalist service will admit for ongoing management to stepdown.  Final Clinical Impressions(s) / ED Diagnoses   Final diagnoses:  Sepsis, due to unspecified organism [facility])    New Prescriptions New Prescriptions   No medications on file     Forde Dandy, MD 03/07/16 2307

## 2016-03-07 NOTE — ED Notes (Signed)
Patient in Xray

## 2016-03-08 ENCOUNTER — Inpatient Hospital Stay (HOSPITAL_COMMUNITY): Payer: Medicare Other

## 2016-03-08 ENCOUNTER — Encounter (HOSPITAL_COMMUNITY): Payer: Self-pay | Admitting: Radiology

## 2016-03-08 DIAGNOSIS — M109 Gout, unspecified: Secondary | ICD-10-CM | POA: Diagnosis present

## 2016-03-08 DIAGNOSIS — E119 Type 2 diabetes mellitus without complications: Secondary | ICD-10-CM

## 2016-03-08 DIAGNOSIS — E039 Hypothyroidism, unspecified: Secondary | ICD-10-CM | POA: Diagnosis present

## 2016-03-08 DIAGNOSIS — K219 Gastro-esophageal reflux disease without esophagitis: Secondary | ICD-10-CM | POA: Diagnosis present

## 2016-03-08 DIAGNOSIS — B356 Tinea cruris: Secondary | ICD-10-CM | POA: Diagnosis present

## 2016-03-08 DIAGNOSIS — H409 Unspecified glaucoma: Secondary | ICD-10-CM

## 2016-03-08 DIAGNOSIS — N4889 Other specified disorders of penis: Secondary | ICD-10-CM | POA: Diagnosis present

## 2016-03-08 DIAGNOSIS — I4819 Other persistent atrial fibrillation: Secondary | ICD-10-CM | POA: Diagnosis present

## 2016-03-08 DIAGNOSIS — I4891 Unspecified atrial fibrillation: Secondary | ICD-10-CM | POA: Diagnosis present

## 2016-03-08 HISTORY — DX: Gout, unspecified: M10.9

## 2016-03-08 HISTORY — DX: Unspecified glaucoma: H40.9

## 2016-03-08 HISTORY — DX: Type 2 diabetes mellitus without complications: E11.9

## 2016-03-08 LAB — CBC WITH DIFFERENTIAL/PLATELET
Basophils Absolute: 0 10*3/uL (ref 0.0–0.1)
Basophils Relative: 0 %
Eosinophils Absolute: 0 10*3/uL (ref 0.0–0.7)
Eosinophils Relative: 0 %
HCT: 41 % (ref 39.0–52.0)
Hemoglobin: 13.1 g/dL (ref 13.0–17.0)
Lymphocytes Relative: 5 %
Lymphs Abs: 1.4 10*3/uL (ref 0.7–4.0)
MCH: 29.8 pg (ref 26.0–34.0)
MCHC: 32 g/dL (ref 30.0–36.0)
MCV: 93.2 fL (ref 78.0–100.0)
Monocytes Absolute: 4.4 10*3/uL — ABNORMAL HIGH (ref 0.1–1.0)
Monocytes Relative: 16 %
Neutro Abs: 21.9 10*3/uL — ABNORMAL HIGH (ref 1.7–7.7)
Neutrophils Relative %: 79 %
Platelets: 129 10*3/uL — ABNORMAL LOW (ref 150–400)
RBC: 4.4 MIL/uL (ref 4.22–5.81)
RDW: 14.1 % (ref 11.5–15.5)
WBC Morphology: INCREASED
WBC: 27.7 10*3/uL — ABNORMAL HIGH (ref 4.0–10.5)

## 2016-03-08 LAB — COMPREHENSIVE METABOLIC PANEL
ALT: 19 U/L (ref 17–63)
AST: 21 U/L (ref 15–41)
Albumin: 3.4 g/dL — ABNORMAL LOW (ref 3.5–5.0)
Alkaline Phosphatase: 71 U/L (ref 38–126)
Anion gap: 7 (ref 5–15)
BUN: 11 mg/dL (ref 6–20)
CO2: 24 mmol/L (ref 22–32)
Calcium: 7.9 mg/dL — ABNORMAL LOW (ref 8.9–10.3)
Chloride: 109 mmol/L (ref 101–111)
Creatinine, Ser: 1.02 mg/dL (ref 0.61–1.24)
GFR calc Af Amer: >60 mL/min (ref >60)
GFR calc non Af Amer: >60 mL/min (ref >60)
Glucose, Bld: 146 mg/dL — ABNORMAL HIGH (ref 65–99)
Potassium: 3.9 mmol/L (ref 3.5–5.1)
Sodium: 140 mmol/L (ref 135–145)
Total Bilirubin: 1 mg/dL (ref 0.3–1.2)
Total Protein: 5.7 g/dL — ABNORMAL LOW (ref 6.5–8.1)

## 2016-03-08 LAB — LACTIC ACID, PLASMA
Lactic Acid, Venous: 1.9 mmol/L (ref 0.5–1.9)
Lactic Acid, Venous: 1.4 mmol/L (ref 0.5–1.9)

## 2016-03-08 LAB — MAGNESIUM: Magnesium: 1.9 mg/dL (ref 1.7–2.4)

## 2016-03-08 LAB — GLUCOSE, CAPILLARY
Glucose-Capillary: 133 mg/dL — ABNORMAL HIGH (ref 65–99)
Glucose-Capillary: 143 mg/dL — ABNORMAL HIGH (ref 65–99)
Glucose-Capillary: 126 mg/dL — ABNORMAL HIGH (ref 65–99)
Glucose-Capillary: 117 mg/dL — ABNORMAL HIGH (ref 65–99)

## 2016-03-08 LAB — PHOSPHORUS: Phosphorus: 3 mg/dL (ref 2.5–4.6)

## 2016-03-08 MED ORDER — ONDANSETRON HCL 4 MG PO TABS: 4.0000 mg | ORAL_TABLET | Freq: Four times a day (QID) | ORAL | Status: DC | PRN

## 2016-03-08 MED ORDER — DULOXETINE HCL 30 MG PO CPEP
30.0000 mg | ORAL_CAPSULE | Freq: Every day | ORAL | Status: DC
  Administered 2016-03-08 – 2016-03-13 (×6): 30 mg via ORAL
  Filled 2016-03-08 (×7): qty 1

## 2016-03-08 MED ORDER — ACETAMINOPHEN 500 MG PO TABS
1000.0000 mg | ORAL_TABLET | Freq: Two times a day (BID) | ORAL | Status: DC
  Administered 2016-03-08 – 2016-03-13 (×12): 1000 mg via ORAL
  Filled 2016-03-08 (×12): qty 2

## 2016-03-08 MED ORDER — COLCHICINE 0.6 MG PO TABS: 0.6000 mg | ORAL_TABLET | Freq: Every day | ORAL | Status: DC | PRN

## 2016-03-08 MED ORDER — CALCIUM CARBONATE 1250 (500 CA) MG PO TABS
0.5000 | ORAL_TABLET | Freq: Every day | ORAL | Status: DC
  Administered 2016-03-08 – 2016-03-13 (×6): 250 mg via ORAL
  Filled 2016-03-08 (×7): qty 1

## 2016-03-08 MED ORDER — PIPERACILLIN-TAZOBACTAM 3.375 G IVPB
3.3750 g | Freq: Three times a day (TID) | INTRAVENOUS | Status: DC
  Administered 2016-03-08 – 2016-03-10 (×8): 3.375 g via INTRAVENOUS
  Filled 2016-03-08 (×9): qty 50

## 2016-03-08 MED ORDER — APIXABAN 5 MG PO TABS
5.0000 mg | ORAL_TABLET | Freq: Two times a day (BID) | ORAL | Status: DC
  Administered 2016-03-08 – 2016-03-09 (×5): 5 mg via ORAL
  Filled 2016-03-08 (×6): qty 1

## 2016-03-08 MED ORDER — SODIUM CHLORIDE 0.9 % IV SOLN: INTRAVENOUS | Status: DC

## 2016-03-08 MED ORDER — VANCOMYCIN HCL IN DEXTROSE 750-5 MG/150ML-% IV SOLN
750.0000 mg | Freq: Two times a day (BID) | INTRAVENOUS | Status: DC
  Administered 2016-03-08 – 2016-03-11 (×8): 750 mg via INTRAVENOUS
  Filled 2016-03-08 (×12): qty 150

## 2016-03-08 MED ORDER — IPRATROPIUM-ALBUTEROL 0.5-2.5 (3) MG/3ML IN SOLN: 3.0000 mL | RESPIRATORY_TRACT | Status: DC | PRN

## 2016-03-08 MED ORDER — INSULIN ASPART 100 UNIT/ML ~~LOC~~ SOLN
0.0000 [IU] | Freq: Three times a day (TID) | SUBCUTANEOUS | Status: DC
  Administered 2016-03-08 (×3): 1 [IU] via SUBCUTANEOUS
  Administered 2016-03-09: 2 [IU] via SUBCUTANEOUS
  Administered 2016-03-09: 1 [IU] via SUBCUTANEOUS
  Administered 2016-03-10 (×2): 2 [IU] via SUBCUTANEOUS
  Administered 2016-03-11: 1 [IU] via SUBCUTANEOUS
  Administered 2016-03-11: 2 [IU] via SUBCUTANEOUS

## 2016-03-08 MED ORDER — DILTIAZEM HCL ER COATED BEADS 180 MG PO CP24
180.0000 mg | ORAL_CAPSULE | Freq: Every day | ORAL | Status: DC
  Administered 2016-03-08 – 2016-03-13 (×6): 180 mg via ORAL
  Filled 2016-03-08 (×6): qty 1

## 2016-03-08 MED ORDER — LORATADINE 10 MG PO TABS
10.0000 mg | ORAL_TABLET | Freq: Every day | ORAL | Status: DC
  Administered 2016-03-08 – 2016-03-13 (×6): 10 mg via ORAL
  Filled 2016-03-08 (×7): qty 1

## 2016-03-08 MED ORDER — KETOROLAC TROMETHAMINE 15 MG/ML IJ SOLN
15.0000 mg | Freq: Once | INTRAMUSCULAR | Status: AC
  Administered 2016-03-08: 15 mg via INTRAVENOUS
  Filled 2016-03-08: qty 1

## 2016-03-08 MED ORDER — NITROGLYCERIN 0.4 MG SL SUBL: 0.4000 mg | SUBLINGUAL_TABLET | SUBLINGUAL | Status: DC | PRN

## 2016-03-08 MED ORDER — ONDANSETRON HCL 4 MG/2ML IJ SOLN
4.0000 mg | Freq: Four times a day (QID) | INTRAMUSCULAR | Status: DC | PRN
  Administered 2016-03-08 – 2016-03-09 (×2): 4 mg via INTRAVENOUS
  Filled 2016-03-08 (×2): qty 2

## 2016-03-08 MED ORDER — LATANOPROST 0.005 % OP SOLN
1.0000 [drp] | Freq: Every day | OPHTHALMIC | Status: DC
  Administered 2016-03-08 – 2016-03-12 (×5): 1 [drp] via OPHTHALMIC
  Filled 2016-03-08: qty 2.5

## 2016-03-08 MED ORDER — ZOLPIDEM TARTRATE 5 MG PO TABS: 5.0000 mg | ORAL_TABLET | Freq: Every evening | ORAL | Status: DC | PRN

## 2016-03-08 MED ORDER — SODIUM CHLORIDE 0.9 % IV SOLN
INTRAVENOUS | Status: DC
  Administered 2016-03-08: 04:00:00 via INTRAVENOUS

## 2016-03-08 MED ORDER — MELATONIN 3 MG PO TABS
4.5000 mg | ORAL_TABLET | Freq: Every day | ORAL | Status: DC
  Administered 2016-03-08 – 2016-03-12 (×5): 4.5 mg via ORAL
  Filled 2016-03-08 (×5): qty 1.5

## 2016-03-08 MED ORDER — CLOTRIMAZOLE 1 % EX CREA
TOPICAL_CREAM | Freq: Two times a day (BID) | CUTANEOUS | Status: DC
  Administered 2016-03-08 – 2016-03-09 (×4): via TOPICAL
  Filled 2016-03-08: qty 15

## 2016-03-08 MED ORDER — BRIMONIDINE TARTRATE 0.15 % OP SOLN
1.0000 [drp] | Freq: Two times a day (BID) | OPHTHALMIC | Status: DC
  Administered 2016-03-08 – 2016-03-12 (×10): 1 [drp] via OPHTHALMIC
  Filled 2016-03-08: qty 5

## 2016-03-08 MED ORDER — ALBUTEROL SULFATE (2.5 MG/3ML) 0.083% IN NEBU: 2.5000 mg | INHALATION_SOLUTION | RESPIRATORY_TRACT | Status: DC | PRN

## 2016-03-08 MED ORDER — PREGABALIN 100 MG PO CAPS
100.0000 mg | ORAL_CAPSULE | Freq: Every day | ORAL | Status: DC
  Administered 2016-03-08 – 2016-03-12 (×5): 100 mg via ORAL
  Filled 2016-03-08 (×5): qty 1

## 2016-03-08 MED ORDER — ADULT MULTIVITAMIN W/MINERALS CH
1.0000 | ORAL_TABLET | Freq: Every day | ORAL | Status: DC
  Administered 2016-03-08 – 2016-03-13 (×6): 1 via ORAL
  Filled 2016-03-08 (×7): qty 1

## 2016-03-08 MED ORDER — MAGNESIUM OXIDE 400 (241.3 MG) MG PO TABS
400.0000 mg | ORAL_TABLET | Freq: Every day | ORAL | Status: DC
  Administered 2016-03-08 – 2016-03-13 (×6): 400 mg via ORAL
  Filled 2016-03-08 (×7): qty 1

## 2016-03-08 MED ORDER — FLUTICASONE PROPIONATE 50 MCG/ACT NA SUSP
2.0000 | Freq: Every day | NASAL | Status: DC
  Administered 2016-03-08 – 2016-03-13 (×6): 2 via NASAL
  Filled 2016-03-08: qty 16

## 2016-03-08 MED ORDER — LEVOTHYROXINE SODIUM 75 MCG PO TABS
75.0000 ug | ORAL_TABLET | Freq: Every day | ORAL | Status: DC
  Administered 2016-03-08 – 2016-03-13 (×6): 75 ug via ORAL
  Filled 2016-03-08 (×6): qty 1

## 2016-03-08 MED ORDER — PANTOPRAZOLE SODIUM 40 MG PO TBEC
40.0000 mg | DELAYED_RELEASE_TABLET | Freq: Every day | ORAL | Status: DC
  Administered 2016-03-09 – 2016-03-13 (×5): 40 mg via ORAL
  Filled 2016-03-08 (×5): qty 1

## 2016-03-08 MED ORDER — ALLOPURINOL 300 MG PO TABS
300.0000 mg | ORAL_TABLET | Freq: Every evening | ORAL | Status: DC
  Administered 2016-03-08 – 2016-03-11 (×4): 300 mg via ORAL
  Filled 2016-03-08 (×4): qty 1

## 2016-03-08 MED ORDER — IPRATROPIUM BROMIDE 0.02 % IN SOLN
0.5000 mg | Freq: Four times a day (QID) | RESPIRATORY_TRACT | Status: DC
  Filled 2016-03-08: qty 2.5

## 2016-03-08 MED ORDER — SODIUM CHLORIDE 0.9% FLUSH
3.0000 mL | Freq: Two times a day (BID) | INTRAVENOUS | Status: DC
  Administered 2016-03-08 – 2016-03-12 (×8): 3 mL via INTRAVENOUS

## 2016-03-08 MED ORDER — MOMETASONE FURO-FORMOTEROL FUM 200-5 MCG/ACT IN AERO
2.0000 | INHALATION_SPRAY | Freq: Two times a day (BID) | RESPIRATORY_TRACT | Status: DC
  Administered 2016-03-08 – 2016-03-13 (×10): 2 via RESPIRATORY_TRACT
  Filled 2016-03-08: qty 8.8

## 2016-03-08 MED ORDER — DOXAZOSIN MESYLATE 8 MG PO TABS
8.0000 mg | ORAL_TABLET | Freq: Every day | ORAL | Status: DC
  Administered 2016-03-08 – 2016-03-13 (×6): 8 mg via ORAL
  Filled 2016-03-08 (×6): qty 1

## 2016-03-08 NOTE — ED Notes (Signed)
Pt transported to CT ?

## 2016-03-08 NOTE — Progress Notes (Signed)
Pharmacy Antibiotic Note  Ameya Search is a 82 y.o. male admitted on 03/07/2016 with sepsis.  Pharmacy has been consulted for Vancocin and Zosyn dosing.  Plan: Vancomycin 1000mg  x1 then 750mg  IV every 12 hours.  Goal trough 15-20 mcg/mL. Zosyn 3.375g IV q8h (4 hour infusion).  Height: 5\' 7"  (170.2 cm) Weight: 210 lb (95.3 kg) IBW/kg (Calculated) : 66.1  Temp (24hrs), Avg:102.1 F (38.9 C), Min:102.1 F (38.9 C), Max:102.1 F (38.9 C)   Recent Labs Lab 03/07/16 2007 03/07/16 2042 03/07/16 2324  WBC 17.4*  --   --   CREATININE 1.10  --   --   LATICACIDVEN  --  2.40* 2.50*    Estimated Creatinine Clearance: 58.9 mL/min (by C-G formula based on SCr of 1.1 mg/dL).    No Known Allergies   Thank you for allowing pharmacy to be a part of this patient's care.  Wynona Neat, PharmD, BCPS  03/08/2016 3:43 AM

## 2016-03-08 NOTE — H&P (Signed)
History and Physical    Jonathan [name] [id number] DOB: [birth year] DOA: 03/07/2016  PCP: Thressa Sheller, MD   Patient coming from: Volunteer work site.  Chief Complaint: Generalized weakness.  HPI: Jonathan [name] is a 82 y.o. male with medical history significant of COPD on home oxygen, pneumonia 3, paroxysmal atrial fibrillation on Eliquis, BPH, cataract, glaucoma, depression, fibromyalgia, GERD, gout, hypertension, hypothyroidism, sleep apnea on CPAP, type 2 diabetes on diet who comes to the emergency department via EMS with complaints of generalized weakness.  Per patient, he has been feeling fatigued over the past month. He says that over the past week he has been having worsening weakness, fatigue and body aches. He mentions that he was doing some volunteer work earlier today at around [employer] when he all of a sudden developed a "back coughing spell", chills, nausea, worsening fatigue and weakness. EMS was subsequently called and he was brought to the hospital.   He also states that over the past 2-3 days he has had some burning when he urinates. He also complains of left ear earache. He denies chest pain, dizziness, palpitations, PND, orthopnea or recent pitting edema lower extremities. He denies abdominal pain, but felt nauseous without vomiting. He denies constipation, but states that his stools have been softer than usual for the past few days.  ED Course: The patient received Zosyn, vancomycin, Zofran, supplemental oxygen and 3 L of tenderness IV bolus. He states that he is feeling a lot better now. Lactic acid was 2.4 mmol/liter, WBC 17.4, glucose 186 mg/dL. The urine showed mild ketonuria, glucosuria and granular casts, but no pyuria or bacteriuria.   Imaging showed atelectasis, less likely pneumonia, incidental LUL pulmonary nodule. There is also old T9 compression fracture and old rib fractures among other findings.  Review of Systems: As per HPI otherwise 10 point review of  systems negative.    Past Medical History:  Diagnosis Date  . Atrial fibrillation ([place])   . BPH (benign prostatic hyperplasia)   . Bronchitis   . Cataract   . COPD (chronic obstructive pulmonary disease) (HCC)    2 liters O2 HS  . Depression   . Fatty tumor    waste and back  . Fibromyalgia   . GERD (gastroesophageal reflux disease)   . Glaucoma    bilateral eyes  . Gout   . Hereditary and idiopathic peripheral neuropathy 08/28/2015  . Hypertension   . Hypothyroidism   . Hypoxia   . Insomnia   . Memory disorder 08/28/2015  . On home oxygen therapy    at night with cpap  . Osteoarthritis   . Peripheral neuropathy ([place])   . Rotator cuff tear, left   . Sleep apnea    uses cpap-add oxygen at night  . Spinal compression fracture (HCC) seventh vertebre  . Transient alteration of awareness 08/28/2015  . Wears glasses     Past Surgical History:  Procedure Laterality Date  . APPENDECTOMY  age 65  . CARPAL TUNNEL RELEASE Right    early 2000s  . CATARACT EXTRACTION     bilateral  . CHOLECYSTECTOMY  age 79  . EYE SURGERY    . FOOT ARTHRODESIS Right 02/02/2013   Procedure: RIGHT HALLUX METATARSAL PHALANGEAL JOINT ARTHRODESIS ;  Surgeon: Wylene Simmer, MD;  Location: [facility];  Service: Orthopedics;  Laterality: Right;  . INGUINAL HERNIA REPAIR  age 16   rt side  . NASAL CONCHA BULLOSA RESECTION  age 42  . PROSTATE SURGERY    .  SHOULDER ARTHROSCOPY W/ ROTATOR CUFF REPAIR Right    early 2000s  . tonsil    . VASECTOMY  age 30     reports that he quit smoking about 40 years ago. His smoking use included Cigarettes. He has a 20.00 pack-year smoking history. He has never used smokeless tobacco. He reports that he drinks alcohol. He reports that he does not use drugs.  No Known Allergies  Family History  Problem Relation Age of Onset  . Coronary artery disease Brother   . Heart disease Father   . Lung cancer Father   . Kidney cancer Father   . Prostate  cancer Father   . Arthritis Mother   . Lung cancer Mother     Prior to Admission medications   Medication Sig Start Date End Date Taking? Authorizing Provider  acetaminophen (TYLENOL) 500 MG tablet Take 2 tablets (1,000 mg total) by mouth 2 (two) times daily. 11/23/12  Yes Posey Boyer, MD  alendronate (FOSAMAX) 70 MG tablet Take 70 mg by mouth every Monday. 01/09/16  Yes Historical Provider, MD  allopurinol (ZYLOPRIM) 300 MG tablet Take 300 mg by mouth every evening.  10/31/14  Yes Historical Provider, MD  brimonidine (ALPHAGAN) 0.15 % ophthalmic solution Place 1 drop into both eyes 2 (two) times daily.  12/06/14  Yes Historical Provider, MD  Calcium Lactate 750 MG TABS Take 1 tablet by mouth daily.    Yes Historical Provider, MD  colchicine (COLCRYS) 0.6 MG tablet Take 0.6 mg by mouth daily as needed (flareups).    Yes Historical Provider, MD  diltiazem (CARDIZEM CD) 180 MG 24 hr capsule Take 1 capsule (180 mg total) by mouth daily. 02/25/12  Yes Posey Boyer, MD  doxazosin (CARDURA) 8 MG tablet Take 1 tablet (8 mg total) by mouth daily. PATIENT NEEDS OFFICE VISIT FOR ADDITIONAL REFILLS 07/15/13  Yes Posey Boyer, MD  DULERA 200-5 MCG/ACT AERO INHALE 2 PUFFS INTO THE LUNGS 2 (TWO) TIMES DAILY. 10/25/15  Yes Collene Gobble, MD  DULoxetine (CYMBALTA) 30 MG capsule Take 30 mg by mouth daily.    Yes Historical Provider, MD  ELIQUIS 5 MG TABS tablet TAKE 1 TABLET (5 MG TOTAL) BY MOUTH 2 (TWO) TIMES DAILY. 10/17/15  Yes Lelon Perla, MD  fluticasone (FLONASE) 50 MCG/ACT nasal spray Place 2 sprays into the nose 2 (two) times daily as needed for allergies.    Yes Historical Provider, MD  latanoprost (XALATAN) 0.005 % ophthalmic solution Place 1 drop into both eyes at bedtime. 05/02/12  Yes Posey Boyer, MD  levalbuterol [facility] HFA) 45 MCG/ACT inhaler Inhale 1-2 puffs into the lungs every 4 (four) hours as needed for wheezing. 07/06/13  Yes Collene Gobble, MD  levothyroxine (SYNTHROID, LEVOTHROID)  75 MCG tablet Take 1 tablet (75 mcg total) by mouth daily before breakfast. 12/26/12  Yes Posey Boyer, MD  loratadine (CLARITIN) 10 MG tablet Take 10 mg by mouth daily.   Yes Historical Provider, MD  magnesium oxide (MAG-OX) 400 MG tablet Take 400 mg by mouth daily.   Yes Historical Provider, MD  Melatonin 5 MG TABS Take 5 mg by mouth at bedtime.   Yes Historical Provider, MD  mometasone (NASONEX) 50 MCG/ACT nasal spray Place 2 sprays into the nose daily. 12/06/14  Yes Historical Provider, MD  Multiple Vitamin (MULTIVITAMIN) tablet Take 1 tablet by mouth daily.   Yes Historical Provider, MD  nitroGLYCERIN (NITROSTAT) 0.4 MG SL tablet Place 1 tablet (0.4 mg total)  under the tongue every 5 (five) minutes as needed for chest pain. 12/13/14  Yes Rhonda G Barrett, PA-C  NON FORMULARY Inhale 2 L into the lungs at bedtime. CPAP with O2 2L   Yes Historical Provider, MD  omeprazole (PRILOSEC) 20 MG capsule Take 20 mg by mouth daily.   Yes Historical Provider, MD  potassium gluconate (EQL POTASSIUM GLUCONATE) 595 (99 K) MG TABS tablet Take 595 mg by mouth daily.   Yes Historical Provider, MD  pregabalin (LYRICA) 100 MG capsule Take 100 mg by mouth at bedtime.  11/23/12  Yes Posey Boyer, MD  tadalafil (CIALIS) 20 MG tablet Take 20 mg by mouth daily as needed. For erectile dysfunction   Yes Historical Provider, MD  zaleplon (SONATA) 10 MG capsule Take 10 mg by mouth at bedtime.    Yes Historical Provider, MD    Physical Exam: Vitals:   03/08/16 0130 03/08/16 0145 03/08/16 0200 03/08/16 0215  BP: 128/66  122/64 127/62  Pulse: 83 83 77 77  Resp: 25 23 16 12   Temp:      TempSrc:      SpO2: 92% 97% 99% 98%  Weight:      Height:          Constitutional: NAD, calm, comfortable Vitals:   03/08/16 0130 03/08/16 0145 03/08/16 0200 03/08/16 0215  BP: 128/66  122/64 127/62  Pulse: 83 83 77 77  Resp: 25 23 16 12   Temp:      TempSrc:      SpO2: 92% 97% 99% 98%  Weight:      Height:       Eyes:  PERRL, lids and conjunctivae normal ENMT: Mucous membranes are moist. Posterior pharynx clear of any exudate or lesions.Dentition shows extensive previous repair.  Neck: normal, supple, no masses, no thyromegaly Respiratory: CTA, no wheezing, no rhonchi, no crackles. Normal respiratory effort. No accessory muscle use.  Cardiovascular: Regular rate and rhythm, no murmurs / rubs / gallops. No extremity edema. 2+ pedal pulses. No carotid bruits.  Abdomen: Obese, positive RLQ scar, bowel sounds positive. Soft, no tenderness, no masses palpated. No hepatosplenomegaly.  Genitalia: Mild swelling and erythema of the preputial skin. Positive erythema in the renal area particularly on the right.  Musculoskeletal: no clubbing / cyanosis. No joint deformity upper and lower extremities. Good ROM, no contractures. Normal muscle tone.  Skin: Erythema right inguinal-scrotal area. Neurologic: CN 2-12 grossly intact. Sensation intact, DTR normal. Strength 5/5 in all 4.  Psychiatric: Normal judgment and insight. Alert and oriented x 4. Normal mood.    Labs on Admission: I have personally reviewed following labs and imaging studies  CBC:  Recent Labs Lab 03/07/16 2007  WBC 17.4*  NEUTROABS 14.1*  HGB 14.5  HCT 45.3  MCV 93.6  PLT 123456*   Basic Metabolic Panel:  Recent Labs Lab 03/07/16 2007  NA 137  K 3.7  CL 104  CO2 24  GLUCOSE 186*  BUN 13  CREATININE 1.10  CALCIUM 9.0   GFR: Estimated Creatinine Clearance: 58.9 mL/min (by C-G formula based on SCr of 1.1 mg/dL). Liver Function Tests:  Recent Labs Lab 03/07/16 2007  AST 22  ALT 20  ALKPHOS 93  BILITOT 0.7  PROT 6.7  ALBUMIN 4.0   No results for input(s): LIPASE, AMYLASE in the last 168 hours. No results for input(s): AMMONIA in the last 168 hours. Coagulation Profile: No results for input(s): INR, PROTIME in the last 168 hours. Cardiac Enzymes: No results for input(s):  CKTOTAL, CKMB, CKMBINDEX, TROPONINI in the last 168  hours. BNP (last 3 results) No results for input(s): PROBNP in the last 8760 hours. HbA1C: No results for input(s): HGBA1C in the last 72 hours. CBG: No results for input(s): GLUCAP in the last 168 hours. Lipid Profile: No results for input(s): CHOL, HDL, LDLCALC, TRIG, CHOLHDL, LDLDIRECT in the last 72 hours. Thyroid Function Tests: No results for input(s): TSH, T4TOTAL, FREET4, T3FREE, THYROIDAB in the last 72 hours. Anemia Panel: No results for input(s): VITAMINB12, FOLATE, FERRITIN, TIBC, IRON, RETICCTPCT in the last 72 hours. Urine analysis:    Component Value Date/Time   COLORURINE YELLOW 03/07/2016 2015   APPEARANCEUR CLEAR 03/07/2016 2015   LABSPEC 1.025 03/07/2016 2015   PHURINE 6.0 03/07/2016 2015   GLUCOSEU NEGATIVE 03/07/2016 2015   HGBUR NEGATIVE 03/07/2016 2015   BILIRUBINUR NEGATIVE 03/07/2016 2015   BILIRUBINUR neg 11/23/2012 0942   KETONESUR 15 (A) 03/07/2016 2015   PROTEINUR 100 (A) 03/07/2016 2015   UROBILINOGEN 0.2 11/23/2012 0942   NITRITE NEGATIVE 03/07/2016 2015   LEUKOCYTESUR NEGATIVE 03/07/2016 2015     Radiological Exams on Admission: Dg Chest 2 View  Result Date: 03/07/2016 CLINICAL DATA:  Generalized weakness. History of atrial fibrillation. EXAM: CHEST  2 VIEW COMPARISON:  June 25, 2015 FINDINGS: No pneumothorax. The lateral view is markedly limited due to positioning. Possible opacity in the left lung base. The heart size is normal. The mediastinum is prominent compared to the previous study. Whether this is a true finding or due to positioning is unclear. If there is concern, a CT scan for better position chest x-ray could better evaluate. The right lung is clear. Mild atelectasis in the right base. No other acute abnormalities. IMPRESSION: The study is limited due to positioning, particularly the lateral view. Opacity is not excluded in the left base. The mediastinum is more prominent in the interval. Whether this is a true finding or due to  positioning is unclear. Recommend either a better position chest x-ray when the patient is able or a CT scan for better evaluation. Electronically Signed   By: Dorise Bullion III M.D   On: 03/07/2016 20:55   Ct Chest W Contrast  Result Date: 03/08/2016 CLINICAL DATA:  Fatigue for month, hemoptysis. Assess for pneumonia. History atrial fibrillation, bronchitis, hypertension. EXAM: CT CHEST WITH CONTRAST TECHNIQUE: Multidetector CT imaging of the chest was performed during intravenous contrast administration. CONTRAST:  18mL ISOVUE-300 IOPAMIDOL (ISOVUE-300) INJECTION 61% COMPARISON:  Chest radiograph March 07, 2016 at 2034 hours FINDINGS: CARDIOVASCULAR: Heart size is mildly enlarged. Mild coronary artery calcifications. Small amount of low-density free fluid in the central sulcus. No pericardial effusions. Thoracic aorta is normal in course and caliber with moderate calcific atherosclerosis in intimal thickening. MEDIASTINUM/NODES: No mediastinal mass. Prominent mediastinal fat. No lymphadenopathy by CT size criteria. Normal appearance of thoracic esophagus though not tailored for evaluation. LUNGS/PLEURA: Tracheobronchial tree is patent, no pneumothorax. Mild bronchial wall thickening. 2 mm calcified granuloma RIGHT middle lobe. Bibasilar dependent atelectasis and trace pleural effusions. Patchy enhancing consolidation LEFT lung base with air bronchograms. Irregular 10 x 7 mm LEFT upper lobe, apical posterior segment, axial 32/145. Minimal bibasilar calcified pleural plaques. UPPER ABDOMEN: Status post cholecystectomy. Diffusely hypodense liver compatible with steatosis. Small hiatal hernia. MUSCULOSKELETAL: Included view of the neck demonstrates medially deviated RIGHT crack or arachnoid cartilage and cord, query vocal cord paralysis. Suture anchors RIGHT humeral head. Sub cm sclerotic focus LEFT posterior eighth rib, likely bone island. Old RIGHT anterior rib fractures.  No acute osseous process . Old  moderate T9 compression fracture. Severe degenerative change of the included cervical spine. IMPRESSION: Bronchial wall thickening can be seen with bronchitis or reactive airway disease. Bibasilar atelectasis, less likely pneumonia. Minimal bibasilar calcified pleural plaques and trace pleural effusions. **An incidental finding of potential clinical significance has been found. 10 mm LEFT upper lobe pulmonary nodule ; recommend follow-up CT or PET- CT at 3 months.** Electronically Signed   By: Elon Alas M.D.   On: 03/08/2016 01:25    EKG: Independently reviewed. Sinus rhythm Low voltage, precordial leads Probable anteroseptal infarct, old Vent. rate 95 BPM PR interval * ms QRS duration 94 ms QT/QTc 346/435 ms P-R-T axes 38 51 40  Assessment/Plan Principal Problem:   Sepsis ([place]) Suspect pneumonia given her presentation, atelectasis, history of pneumonia 3. Urinalysis was negative. Admit to telemetry/inpatient. Continue supplemental oxygen. Continue IV fluids. Continue broad-spectrum IV antibiotics. Follow-up blood cultures and sensitivity. Follow-up WBC, renal function and electrolytes.  Active Problems:   COPD (chronic obstructive pulmonary disease) (HCC) Continue supplemental oxygen during. Continue Dulera. Albuterol nebulizer treatments as needed.    Sleep apnea, obstructive Continue CPAP at bedtime.     Pulmonary nodule Advised the patient to have a follow-up recommended in managing study. He will follow-up with his primary care provider.    Hypertension Cardizem CD 180 mg by mouth daily. Continue Cardura 8 mg by mouth daily    Hypothyroidism Continue levothyroxine 75 g by mouth daily. Monitor TSH periodically.    Type 2 diabetes mellitus (HCC) Continue carbohydrate modified diet. Check hemoglobin A1c in the morning. CBG monitoring with regular insulin sliding scale in the hospital.    GERD (gastroesophageal reflux disease) Pantoprazole 40 mg by mouth  daily.    Gout Continue allopurinol 300 mg by mouth daily. Colchicine as needed.    Atrial fibrillation ([place]) Rate is currently controlled. Cardiac monitoring. Continue Cardizem CD 180 mg every 24 hours. Continue apixaban 5 mg by mouth twice a day. Keep electrolytes optimized.    Glaucoma Continue Xalatan and Alphagan drops.    Penile swelling of prepucial skin The patient feels dysuria, but UA is negative. Likely from irritation and swelling of preputial skin. Continue antibiotics and topical treatment. Monitor closely.    Tinea cruris Clotrimazole 1% to genital inguinal area twice a day..     DVT prophylaxis: On Eliquis. Code Status: Full code. Family Communication:  Disposition Plan: Admit for IV antibiotics for 2-3 days. Consults called:  Admission status: Inpatient/telemetry.   Reubin Milan MD [facility] Pager [phone].  If 7PM-7AM, please contact night-coverage [email or web address] Password [facility]  03/08/2016, 2:34 AM

## 2016-03-08 NOTE — Progress Notes (Signed)
PROGRESS NOTE  Jonathan [name]  [id number] DOB: [birth year]  DOA: 03/07/2016 PCP: Thressa Sheller, MD   Brief Narrative:  82 y.o. male with medical history significant of COPD on home oxygen, pneumonia 3, paroxysmal atrial fibrillation on Eliquis, BPH, cataract, glaucoma, depression, fibromyalgia, GERD, gout, hypertension, hypothyroidism, sleep apnea on CPAP, type 2 diabetes on diet who comes to the emergency department via EMS with complaints of generalized weakness, fatigue, body aches for a week prior to admission and then on the day of admission he developed a bout of coughing spells, chills and fever. Reported some dysuria and left earache. Admitted for presumed sepsis and possible pneumonia.   Assessment & Plan:   Principal Problem:   Sepsis ([place]) Active Problems:   COPD (chronic obstructive pulmonary disease) (HCC)   Sleep apnea, obstructive   Hypertension   Hypothyroidism   GERD (gastroesophageal reflux disease)   Gout   Atrial fibrillation (HCC)   Glaucoma   Penile swelling   Tinea cruris   Type 2 diabetes mellitus (HCC)   Sepsis, possibly from community-acquired pneumonia versus acute bronchitis - Initially felt that his presentation was consistent with pneumonia. CT chest showed bronchitis, bibasal atelectasis-less likely pneumonia. Marked leukocytosis: 27K/neutrophilia with target cells and bandemia. - Repeat chest x-ray 9/24: Shallow inspiration with atelectasis in the left lung base. Chronic bronchitic changes. No evidence of active pulmonary disease. - Previous history of pneumonia 3. Urine microscopy not suggestive of UTI. - Treated with IV fluids, IV vancomycin and Zosyn  - Clinically improved. Sepsis physiology resolved. Blood cultures 2: Negative to date. Lactate has normalized. - Monitor closely.   Oxygen dependent COPD - Stable without clinical bronchospasm.   OSA on nightly CPAP  Pulmonary Nodule (10 mm left upper lobe) - recommended OP follow up  with CT or PET CT in 3 months and patient verbalized understanding.  Essential HTN - Mildly uncontrolled. Continue Cardizem CD and Cardura.  Hypothyroid - Continue Synthroid.  Type II DM - SSI.  GERD - PPI.  Paroxysmal atrial fibrillation - Currently in sinus rhythm. Continue Cardizem and Apixaban  Thrombocytopenia Unclear etiology.? Secondary to acute illness/sepsis picture. Follow CBC in a.m.     DVT prophylaxis: on Eliquis AC Code Status: Full Family Communication: None at bedside Disposition Plan: DC home when medically improved, possibly in 1-2 days   Consultants:   None  Procedures:   None  Antimicrobials:   IV Vancomycin  IV Zosyn    Subjective: "I feel [phone] times better". Fever and dyspnea have resolved. Minimal intermittent dry cough. Denies any other complaints.   Objective:  Vitals:   03/08/16 0345 03/08/16 0417 03/08/16 0503 03/08/16 1338  BP: (!) 122/106  130/64 (!) 109/53  Pulse: 77  69 68  Resp: 18  20 17   Temp: 99 F (37.2 C)   99.4 F (37.4 C)  TempSrc: Oral   Oral  SpO2: 99%  97% 96%  Weight:  98 kg (216 lb)    Height:  5\' 7"  (1.702 m)      Intake/Output Summary (Last 24 hours) at 03/08/16 1514 Last data filed at 03/08/16 0619  Gross per 24 hour  Intake          3556.67 ml  Output              900 ml  Net          2656.67 ml   Filed Weights   03/07/16 2005 03/08/16 0417  Weight: 95.3 kg (210 lb) 98 kg (216 lb)  Examination:  General exam: Pleasant elderly male lying comfortably propped up in bed. Oral mucosa, borderline hydration.  Respiratory system: slightly diminished breath sounds in the bases but otherwise clear to auscultation. Respiratory effort normal. Cardiovascular system: S1 & S2 heard, RRR. No JVD, murmurs, rubs, gallops or clicks. No pedal edema. telemetry: Sinus rhythm.  Gastrointestinal system: Abdomen is nondistended, soft and nontender. No organomegaly or masses felt. Normal bowel sounds  heard. Central nervous system: Alert and oriented. No focal neurological deficits. Extremities: Symmetric 5 x 5 power. Skin: No rashes, lesions or ulcers Psychiatry: Judgement and insight appear normal. Mood & affect appropriate.     Data Reviewed: I have personally reviewed following labs and imaging studies  CBC:  Recent Labs Lab 03/07/16 2007 03/08/16 0350  WBC 17.4* 27.7*  NEUTROABS 14.1* 21.9*  HGB 14.5 13.1  HCT 45.3 41.0  MCV 93.6 93.2  PLT 144* [id number]*   Basic Metabolic Panel:  Recent Labs Lab 03/07/16 2007 03/08/16 0350  NA 137 140  K 3.7 3.9  CL 104 109  CO2 24 24  GLUCOSE 186* 146*  BUN 13 11  CREATININE 1.10 1.02  CALCIUM 9.0 7.9*  MG  --  1.9  PHOS  --  3.0   GFR: Estimated Creatinine Clearance: 64.5 mL/min (by C-G formula based on SCr of 1.02 mg/dL). Liver Function Tests:  Recent Labs Lab 03/07/16 2007 03/08/16 0350  AST 22 21  ALT 20 19  ALKPHOS 93 71  BILITOT 0.7 1.0  PROT 6.7 5.7*  ALBUMIN 4.0 3.4*   No results for input(s): LIPASE, AMYLASE in the last 168 hours. No results for input(s): AMMONIA in the last 168 hours. Coagulation Profile: No results for input(s): INR, PROTIME in the last 168 hours. Cardiac Enzymes: No results for input(s): CKTOTAL, CKMB, CKMBINDEX, TROPONINI in the last 168 hours. BNP (last 3 results) No results for input(s): PROBNP in the last 8760 hours. HbA1C: No results for input(s): HGBA1C in the last 72 hours. CBG:  Recent Labs Lab 03/08/16 0804 03/08/16 1214  GLUCAP 133* 143*   Lipid Profile: No results for input(s): CHOL, HDL, LDLCALC, TRIG, CHOLHDL, LDLDIRECT in the last 72 hours. Thyroid Function Tests: No results for input(s): TSH, T4TOTAL, FREET4, T3FREE, THYROIDAB in the last 72 hours. Anemia Panel: No results for input(s): VITAMINB12, FOLATE, FERRITIN, TIBC, IRON, RETICCTPCT in the last 72 hours.  Sepsis Labs:  Recent Labs Lab 03/07/16 2042 03/07/16 2324 03/08/16 0350 03/08/16 0615   LATICACIDVEN 2.40* 2.50* 1.9 1.4    Recent Results (from the past 240 hour(s))  Blood Culture (routine x 2)     Status: None (Preliminary result)   Collection Time: 03/07/16  8:07 PM  Result Value Ref Range Status   Specimen Description BLOOD RIGHT ANTECUBITAL  Final   Special Requests BOTTLES DRAWN AEROBIC AND ANAEROBIC 5CC EA  Final   Culture NO GROWTH < 24 HOURS  Final   Report Status PENDING  Incomplete  Blood Culture (routine x 2)     Status: None (Preliminary result)   Collection Time: 03/07/16  8:14 PM  Result Value Ref Range Status   Specimen Description BLOOD LEFT HAND  Final   Special Requests BOTTLES DRAWN AEROBIC AND ANAEROBIC 5CC EA  Final   Culture NO GROWTH < 24 HOURS  Final   Report Status PENDING  Incomplete         Radiology Studies: Dg Chest 2 View  Result Date: 03/07/2016 CLINICAL DATA:  Generalized weakness. History of atrial fibrillation. EXAM:  CHEST  2 VIEW COMPARISON:  June 25, 2015 FINDINGS: No pneumothorax. The lateral view is markedly limited due to positioning. Possible opacity in the left lung base. The heart size is normal. The mediastinum is prominent compared to the previous study. Whether this is a true finding or due to positioning is unclear. If there is concern, a CT scan for better position chest x-ray could better evaluate. The right lung is clear. Mild atelectasis in the right base. No other acute abnormalities. IMPRESSION: The study is limited due to positioning, particularly the lateral view. Opacity is not excluded in the left base. The mediastinum is more prominent in the interval. Whether this is a true finding or due to positioning is unclear. Recommend either a better position chest x-ray when the patient is able or a CT scan for better evaluation. Electronically Signed   By: Dorise Bullion III M.D   On: 03/07/2016 20:55   Ct Chest W Contrast  Result Date: 03/08/2016 CLINICAL DATA:  Fatigue for month, hemoptysis. Assess for pneumonia.  History atrial fibrillation, bronchitis, hypertension. EXAM: CT CHEST WITH CONTRAST TECHNIQUE: Multidetector CT imaging of the chest was performed during intravenous contrast administration. CONTRAST:  79mL ISOVUE-300 IOPAMIDOL (ISOVUE-300) INJECTION 61% COMPARISON:  Chest radiograph March 07, 2016 at 2034 hours FINDINGS: CARDIOVASCULAR: Heart size is mildly enlarged. Mild coronary artery calcifications. Small amount of low-density free fluid in the central sulcus. No pericardial effusions. Thoracic aorta is normal in course and caliber with moderate calcific atherosclerosis in intimal thickening. MEDIASTINUM/NODES: No mediastinal mass. Prominent mediastinal fat. No lymphadenopathy by CT size criteria. Normal appearance of thoracic esophagus though not tailored for evaluation. LUNGS/PLEURA: Tracheobronchial tree is patent, no pneumothorax. Mild bronchial wall thickening. 2 mm calcified granuloma RIGHT middle lobe. Bibasilar dependent atelectasis and trace pleural effusions. Patchy enhancing consolidation LEFT lung base with air bronchograms. Irregular 10 x 7 mm LEFT upper lobe, apical posterior segment, axial 32/145. Minimal bibasilar calcified pleural plaques. UPPER ABDOMEN: Status post cholecystectomy. Diffusely hypodense liver compatible with steatosis. Small hiatal hernia. MUSCULOSKELETAL: Included view of the neck demonstrates medially deviated RIGHT crack or arachnoid cartilage and cord, query vocal cord paralysis. Suture anchors RIGHT humeral head. Sub cm sclerotic focus LEFT posterior eighth rib, likely bone island. Old RIGHT anterior rib fractures. No acute osseous process . Old moderate T9 compression fracture. Severe degenerative change of the included cervical spine. IMPRESSION: Bronchial wall thickening can be seen with bronchitis or reactive airway disease. Bibasilar atelectasis, less likely pneumonia. Minimal bibasilar calcified pleural plaques and trace pleural effusions. **An incidental finding  of potential clinical significance has been found. 10 mm LEFT upper lobe pulmonary nodule ; recommend follow-up CT or PET- CT at 3 months.** Electronically Signed   By: Elon Alas M.D.   On: 03/08/2016 01:25   Dg Chest Port 1 View  Result Date: 03/08/2016 CLINICAL DATA:  Sepsis. EXAM: PORTABLE CHEST 1 VIEW COMPARISON:  03/07/2016 FINDINGS: Shallow inspiration. Mild cardiac enlargement. Peribronchial thickening with central interstitial changes consistent with chronic bronchitis. Probable atelectasis in the left lung base. No focal airspace disease or consolidation in the lungs. No blunting of costophrenic angles. No pneumothorax. Calcified and tortuous aorta. IMPRESSION: Shallow inspiration with atelectasis in the left lung base. Chronic bronchitic changes. No evidence of active pulmonary disease. Electronically Signed   By: Lucienne Capers M.D.   On: 03/08/2016 04:08        Scheduled Meds: . acetaminophen  1,000 mg Oral BID  . allopurinol  300 mg Oral QPM  .  apixaban  5 mg Oral BID  . brimonidine  1 drop Both Eyes BID  . calcium carbonate  0.5 tablet Oral Daily  . clotrimazole   Topical BID  . diltiazem  180 mg Oral Daily  . doxazosin  8 mg Oral Daily  . DULoxetine  30 mg Oral Daily  . fluticasone  2 spray Each Nare Daily  . insulin aspart  0-9 Units Subcutaneous TID WC  . latanoprost  1 drop Both Eyes QHS  . levothyroxine  75 mcg Oral QAC breakfast  . loratadine  10 mg Oral Daily  . magnesium oxide  400 mg Oral Daily  . Melatonin  4.5 mg Oral QHS  . mometasone-formoterol  2 puff Inhalation BID  . multivitamin with minerals  1 tablet Oral Daily  . pantoprazole  40 mg Oral Daily  . piperacillin-tazobactam (ZOSYN)  IV  3.375 g Intravenous Q8H  . pregabalin  100 mg Oral QHS  . sodium chloride flush  3 mL Intravenous Q12H  . vancomycin  750 mg Intravenous Q12H   Continuous Infusions: . sodium chloride 100 mL/hr at 03/08/16 0345     LOS: 1 day      [facility],  MD [facility] Pager [phone]  If 7PM-7AM, please contact night-coverage [email or web address] Password [email or web address] 03/08/2016, 3:14 PM

## 2016-03-09 ENCOUNTER — Encounter (HOSPITAL_COMMUNITY): Payer: Self-pay

## 2016-03-09 ENCOUNTER — Inpatient Hospital Stay (HOSPITAL_COMMUNITY): Payer: Medicare Other

## 2016-03-09 DIAGNOSIS — J189 Pneumonia, unspecified organism: Secondary | ICD-10-CM

## 2016-03-09 LAB — CBC WITH DIFFERENTIAL/PLATELET
Basophils Absolute: 0 10*3/uL (ref 0.0–0.1)
Basophils Relative: 0 %
Eosinophils Absolute: 0 10*3/uL (ref 0.0–0.7)
Eosinophils Relative: 0 %
HCT: 40.6 % (ref 39.0–52.0)
Hemoglobin: 12.9 g/dL — ABNORMAL LOW (ref 13.0–17.0)
Lymphocytes Relative: 9 %
Lymphs Abs: 1.6 10*3/uL (ref 0.7–4.0)
MCH: 29.7 pg (ref 26.0–34.0)
MCHC: 31.8 g/dL (ref 30.0–36.0)
MCV: 93.5 fL (ref 78.0–100.0)
Monocytes Absolute: 3.2 10*3/uL — ABNORMAL HIGH (ref 0.1–1.0)
Monocytes Relative: 18 %
Neutro Abs: 12.8 10*3/uL — ABNORMAL HIGH (ref 1.7–7.7)
Neutrophils Relative %: 73 %
Platelets: 112 10*3/uL — ABNORMAL LOW (ref 150–400)
RBC: 4.34 MIL/uL (ref 4.22–5.81)
RDW: 14.5 % (ref 11.5–15.5)
WBC Morphology: INCREASED
WBC: 17.6 10*3/uL — ABNORMAL HIGH (ref 4.0–10.5)

## 2016-03-09 LAB — PATHOLOGIST SMEAR REVIEW

## 2016-03-09 LAB — URINE CULTURE: Culture: NO GROWTH

## 2016-03-09 LAB — GLUCOSE, CAPILLARY
Glucose-Capillary: 98 mg/dL (ref 65–99)
Glucose-Capillary: 185 mg/dL — ABNORMAL HIGH (ref 65–99)
Glucose-Capillary: 140 mg/dL — ABNORMAL HIGH (ref 65–99)
Glucose-Capillary: 176 mg/dL — ABNORMAL HIGH (ref 65–99)

## 2016-03-09 LAB — HEMOGLOBIN A1C
Hgb A1c MFr Bld: 6.3 % — ABNORMAL HIGH (ref 4.8–5.6)
Mean Plasma Glucose: 134 mg/dL

## 2016-03-09 MED ORDER — CLINDAMYCIN PHOSPHATE 900 MG/50ML IV SOLN
900.0000 mg | Freq: Three times a day (TID) | INTRAVENOUS | Status: DC
  Administered 2016-03-10 (×3): 900 mg via INTRAVENOUS
  Filled 2016-03-09 (×5): qty 50

## 2016-03-09 MED ORDER — GUAIFENESIN-DM 100-10 MG/5ML PO SYRP
5.0000 mL | ORAL_SOLUTION | ORAL | Status: DC | PRN
  Administered 2016-03-09 – 2016-03-11 (×3): 5 mL via ORAL
  Filled 2016-03-09 (×3): qty 5

## 2016-03-09 MED ORDER — POLYVINYL ALCOHOL 1.4 % OP SOLN
2.0000 [drp] | OPHTHALMIC | Status: DC | PRN
  Administered 2016-03-09 – 2016-03-11 (×4): 2 [drp] via OPHTHALMIC
  Filled 2016-03-09: qty 15

## 2016-03-09 MED ORDER — TRAZODONE HCL 50 MG PO TABS
25.0000 mg | ORAL_TABLET | Freq: Once | ORAL | Status: DC | PRN
  Filled 2016-03-09 (×2): qty 1

## 2016-03-09 MED ORDER — IOPAMIDOL (ISOVUE-300) INJECTION 61%
100.0000 mL | Freq: Once | INTRAVENOUS | Status: AC | PRN
  Administered 2016-03-09: 100 mL via INTRAVENOUS

## 2016-03-09 MED ORDER — FLUCONAZOLE 100 MG PO TABS
200.0000 mg | ORAL_TABLET | Freq: Every day | ORAL | Status: DC
  Administered 2016-03-09: 200 mg via ORAL
  Filled 2016-03-09 (×2): qty 2

## 2016-03-09 MED ORDER — IOPAMIDOL (ISOVUE-300) INJECTION 61%
INTRAVENOUS | Status: AC
  Filled 2016-03-09: qty 100

## 2016-03-09 NOTE — Consult Note (Signed)
New Urology Consult Note   Requesting Attending Physician:  [place] Jansky, MD Service Providing Consult: Urology Consulting Attending: Risa Grill, MD  Assessment:  Patient is a 82 y.o. male with extensive medical history, significant for BPH on cardura, A fib on Eliquis, COPD with chronic bronchitis & PNA, HTN, OSA, type II DM (HbA1c 6.5) admitted for presumed sepsis 2/2 PNA. Urology consulted for erythematous perineal process and penile lesion with penile edema. Currently on Vanc/Zosyn with WBC 17 --> 27 -- > 17. Urine & blood cultures NGTD. Cr stable. Physical exam appears consistent with scrotal, penile, perineal cellulitis with punctate draining lesion at the 3 oclock position of the prepucial collar of the uncircumcised penis. Patient is spontaneously voiding. There are no areas of fluctuance or crepitus suggestive of abscess or Fournier's gangrene.   Recommendations: 1. Add IV clindamycin to antibiotic regimen. Continue IV zosyn & vancomycin.  2. CT pelvis with contrast to further assess infectious process of perineum 3. Continue PO diflucan for now 4. Continue to trend WBC, we will follow to serially examine perineum 5. Perineum erythema marked out at 9pm on 03/09/16 to assess for spread 6. No current requirement for surgical or procedural intervention (eg I&D). Consider discontinuation per medical primary team of eliquis if possible in case procedure may become necessary in near future  Thank you for this consult. Please contact the urology consult pager with any further questions/concerns. [name], MD Urology Surgical Resident  I performed a history and physical examination of the patient and discussed his management with the resident.  I reviewed the resident's note and agree with the documented findings and plan of care.  There is significant penile and scrotal edema with erythema but no evidence of fluctuance or crepitance or obvious necrosis. Currently no evidence of  Fournier's gangrene. There is a very small 3-4 mm area of purulent drainage right at the tip of the foreskin. That has spontaneously drained and there does not appear to be any residual fluctuance. It appears that he probably had a small penile abscess along with some cellulitis. We will plan on pelvic CT to rule out any deeper seeded abscess and also to make sure there is no radiographic evidence of air which would suggest a necrotic fasciitis. We'll continue to monitor closely.    HPI -   Reason for Consult:  Balanitis  Jonathan [name] is seen in consultation for reasons noted above at the request of [place] Jansky, MD.  This is a 82 yo patient with a history of a fib on Eliquis, bronchitis, COPD on home O2, depression, fibromyalgia, GERD, gout, HTN, hypothyroidism, Oa, OSA, type II DM currently admitted to Dr. Janelle Floor service for concern of PNA. He also carries a history of BPH on cardura. He presented to the ER on the evening of 9/23 with generalized weakness, fatigue, lethargy. He was also having some difficulty urinating, dysuria. No fever, emesis, diarrhea, abdominal pain. He was febrile and tachycardic in the ER and admitted for presumed sepsis. He also described cough spell with chills, nausea.  He tells me that he first started noticing inability to retract foreskin, dysuria, swelling of the penis about 3 days ago. Prior to that he was voiding at baseline. He is uncircumcised. No recent perineal trauma, bug bite, etc that he is aware of. Had an episode similar to this decades ago where he manually stuck a syringe into penile or perineal swelling and had a lot of drainage, but he can't recall much more than that.  No gross hematuria. Baseline BPH with some voiding dysfunction despite cardura (last seen by urologist in [place] years ago), including 6 month history of urinary incontinence with leakage of urine with urgency and inability to get to restroom. Some loose stools recently. No  significant pain in the scrotum or perineum. Voids in front of me into urinal on my visit with production of clear yellow urine. He does not appear toxic.  Urinalysis in the ER is not consistent with infectious etiology. WBC 17.4 on admission with lactate >2, up to 27 on 9/24, and now 9/25. He has received Zosyn, vancomycin for antibiotics in the ER and has continued on this regimen in the hospital. Urine culture negative. Blood cultures x2 to date.   Urology consulted for assistance with foreskin lesion - described as swelling & redness of the penoscrotal that is NTTP and nonfluctuant, with edema of penile shaft, no warmth, crepitus. He was started on topical fluconazole & then transitioned to PO for this. AFVSS over last 24 hours. Voiding spontaneously. Cr 1.02. HbA1c 6.3.    Past Medical History: Past Medical History:  Diagnosis Date  . Atrial fibrillation ([place])   . BPH (benign prostatic hyperplasia)   . Bronchitis   . Cataract   . COPD (chronic obstructive pulmonary disease) (HCC)    2 liters O2 HS  . Depression   . Fatty tumor    waste and back  . Fibromyalgia   . GERD (gastroesophageal reflux disease)   . Glaucoma    bilateral eyes  . Gout   . Hereditary and idiopathic peripheral neuropathy 08/28/2015  . Hypertension   . Hypothyroidism   . Hypoxia   . Insomnia   . Memory disorder 08/28/2015  . On home oxygen therapy    at night with cpap  . Osteoarthritis   . Peripheral neuropathy ([place])   . Rotator cuff tear, left   . Sleep apnea    uses cpap-add oxygen at night  . Spinal compression fracture (H[facility]) seventh vertebre  . Transient alteration of awareness 08/28/2015  . Type 2 diabetes mellitus ([place]) 03/08/2016  . Wears glasses     Past Surgical History:  Past Surgical History:  Procedure Laterality Date  . APPENDECTOMY  age 35  . CARPAL TUNNEL RELEASE Right    early 2000s  . CATARACT EXTRACTION     bilateral  . CHOLECYSTECTOMY  age 23  . EYE SURGERY    . FOOT  ARTHRODESIS Right 02/02/2013   Procedure: RIGHT HALLUX METATARSAL PHALANGEAL JOINT ARTHRODESIS ;  Surgeon: Wylene Simmer, MD;  Location: [facility];  Service: Orthopedics;  Laterality: Right;  . INGUINAL HERNIA REPAIR  age 30   rt side  . NASAL CONCHA BULLOSA RESECTION  age 71  . PROSTATE SURGERY    . SHOULDER ARTHROSCOPY W/ ROTATOR CUFF REPAIR Right    early 2000s  . tonsil    . VASECTOMY  age 72    Medication: Current Facility-Administered Medications  Medication Dose Route Frequency Provider Last Rate Last Dose  . acetaminophen (TYLENOL) tablet 1,000 mg  1,000 mg Oral BID Reubin Milan, MD   1,000 mg at 03/09/16 1038  . allopurinol (ZYLOPRIM) tablet 300 mg  300 mg Oral QPM Reubin Milan, MD   300 mg at 03/09/16 1715  . apixaban (ELIQUIS) tablet 5 mg  5 mg Oral BID Reubin Milan, MD   5 mg at 03/09/16 1039  . brimonidine (ALPHAGAN) 0.15 % ophthalmic solution 1 drop  1  drop Both Eyes BID Reubin Milan, MD   1 drop at 03/09/16 1049  . calcium carbonate (OS-CAL - dosed in mg of elemental calcium) tablet 250 mg of elemental calcium  0.5 tablet Oral Daily Reubin Milan, MD   250 mg of elemental calcium at 03/09/16 1038  . colchicine tablet 0.6 mg  0.6 mg Oral Daily PRN Reubin Milan, MD      . diltiazem [facility] CD) 24 hr capsule 180 mg  180 mg Oral Daily Reubin Milan, MD   180 mg at 03/09/16 1038  . doxazosin (CARDURA) tablet 8 mg  8 mg Oral Daily Reubin Milan, MD   8 mg at 03/09/16 1039  . DULoxetine (CYMBALTA) DR capsule 30 mg  30 mg Oral Daily Reubin Milan, MD   30 mg at 03/09/16 1038  . fluconazole (DIFLUCAN) tablet 200 mg  200 mg Oral Daily [place] Jansky, MD      . fluticasone [facility]) 50 MCG/ACT nasal spray 2 spray  2 spray Each Nare Daily Reubin Milan, MD   2 spray at 03/09/16 1050  . guaiFENesin-dextromethorphan (ROBITUSSIN DM) 100-10 MG/5ML syrup 5 mL  5 mL Oral Q4H PRN [place] Jansky, MD   5 mL at 03/09/16 1319   . insulin aspart (novoLOG) injection 0-9 Units  0-9 Units Subcutaneous TID WC Reubin Milan, MD   1 Units at 03/09/16 1715  . ipratropium-albuterol (DUONEB) 0.5-2.5 (3) MG/3ML nebulizer solution 3 mL  3 mL Nebulization Q4H PRN [place] Jansky, MD      . latanoprost (XALATAN) 0.005 % ophthalmic solution 1 drop  1 drop Both Eyes QHS Reubin Milan, MD   1 drop at 03/08/16 2301  . levothyroxine (SYNTHROID, LEVOTHROID) tablet 75 mcg  75 mcg Oral QAC breakfast Reubin Milan, MD   75 mcg at 03/09/16 [phone]  . loratadine (CLARITIN) tablet 10 mg  10 mg Oral Daily Reubin Milan, MD   10 mg at 03/09/16 1038  . magnesium oxide (MAG-OX) tablet 400 mg  400 mg Oral Daily Reubin Milan, MD   400 mg at 03/09/16 1039  . Melatonin TABS 4.5 mg  4.5 mg Oral QHS Reubin Milan, MD   4.5 mg at 03/08/16 2300  . mometasone-formoterol (DULERA) 200-5 MCG/ACT inhaler 2 puff  2 puff Inhalation BID Reubin Milan, MD   2 puff at 03/09/16 0944  . multivitamin with minerals tablet 1 tablet  1 tablet Oral Daily Reubin Milan, MD   1 tablet at 03/09/16 1039  . nitroGLYCERIN (NITROSTAT) SL tablet 0.4 mg  0.4 mg Sublingual Q5 min PRN Reubin Milan, MD      . pantoprazole (PROTONIX) EC tablet 40 mg  40 mg Oral Daily Reubin Milan, MD   40 mg at 03/09/16 1039  . piperacillin-tazobactam (ZOSYN) IVPB 3.375 g  3.375 g Intravenous Q8H Veronda P Bryk, RPH   3.375 g at 03/09/16 1230  . polyvinyl alcohol (LIQUIFILM TEARS) 1.4 % ophthalmic solution 2 drop  2 drop Both Eyes PRN [place] Jansky, MD   2 drop at 03/09/16 1319  . pregabalin (LYRICA) capsule 100 mg  100 mg Oral QHS Reubin Milan, MD   100 mg at 03/08/16 2300  . sodium chloride flush (NS) 0.9 % injection 3 mL  3 mL Intravenous Q12H Reubin Milan, MD   3 mL at 03/09/16 1000  . vancomycin (VANCOCIN) IVPB 750 mg/150 ml premix  750  mg Intravenous Q12H Laren Everts, RPH   750 mg at 03/09/16 1050  . zolpidem (AMBIEN) tablet 5 mg   5 mg Oral QHS PRN Reubin Milan, MD        Allergies: No Known Allergies  Social History: Social History  Substance Use Topics  . Smoking status: Former Smoker    Packs/day: 2.00    Years: 10.00    Types: Cigarettes    Quit date: 07/18/1975  . Smokeless tobacco: Never Used  . Alcohol use Yes     Comment: 1-2 drinks/day    Family History Family History  Problem Relation Age of Onset  . Coronary artery disease Brother   . Heart disease Father   . Lung cancer Father   . Kidney cancer Father   . Prostate cancer Father   . Arthritis Mother   . Lung cancer Mother     Review of Systems 10 systems were reviewed and are negative except as noted specifically in the HPI.  Objective   Vital signs in last 24 hours: BP (!) 131/57 (BP Location: Left Arm)   Pulse 76   Temp 99.2 F (37.3 C) (Oral)   Resp 19   Ht 5\' 7"  (1.702 m)   Wt 216 lb (98 kg)   SpO2 93%   BMI 33.83 kg/m   Intake/Output last 3 shifts: I/O last 3 completed shifts: In: 1323.8 [I.V.:873.8; IV Piggyback:450] Out: 750 [Urine:750]  Physical Exam General: NAD, A&O, resting, appropriate, appears younger than stated age 41: [place]/AT, EOMI, MMM Pulmonary: Normal work of breathing on RA Cardiovascular: Regular rate & rhythm, HDS, adequate peripheral perfusion Abdomen: soft, NTTP, nondistended, no suprapubic fullness or tenderness GU: voiding spontaneously, erythema and cellulitis changes to the entire scrotum, penis and suprapubic fat pad with minor involvement tracing the bilateral spermatic cords, moderate to significant penile edema with inability to retract foreskin or visualize glans/urethral meatus, no areas of fluctuance noted, no crepitus noted, mildly TTP and less so than would be expected based on erythema, small area of draining purulent fluid at 3 o'clock of the prepuce which is moderately TTP and is surrounded by more beefy red tissue but again no fluctuance or areas of necrosis Extremities: warm  and well perfused, no edema Neuro: Appropriate, no focal neurological deficits  Most Recent Labs: Lab Results  Component Value Date   WBC 17.6 (H) 03/09/2016   HGB 12.9 (L) 03/09/2016   HCT 40.6 03/09/2016   PLT 112 (L) 03/09/2016    Lab Results  Component Value Date   NA 140 03/08/2016   K 3.9 03/08/2016   CL 109 03/08/2016   CO2 24 03/08/2016   BUN 11 03/08/2016   CREATININE 1.02 03/08/2016   CALCIUM 7.9 (L) 03/08/2016   MG 1.9 03/08/2016   PHOS 3.0 03/08/2016    Lab Results  Component Value Date   ALKPHOS 71 03/08/2016   BILITOT 1.0 03/08/2016   PROT 5.7 (L) 03/08/2016   ALBUMIN 3.4 (L) 03/08/2016   ALT 19 03/08/2016   AST 21 03/08/2016    Lab Results  Component Value Date   INR 1.00 07/20/2011   APTT 25 07/20/2011     Urine Culture: Negative   IMAGING: Dg Chest 2 View  Result Date: 03/07/2016 CLINICAL DATA:  Generalized weakness. History of atrial fibrillation. EXAM: CHEST  2 VIEW COMPARISON:  June 25, 2015 FINDINGS: No pneumothorax. The lateral view is markedly limited due to positioning. Possible opacity in the left lung base. The heart size  is normal. The mediastinum is prominent compared to the previous study. Whether this is a true finding or due to positioning is unclear. If there is concern, a CT scan for better position chest x-ray could better evaluate. The right lung is clear. Mild atelectasis in the right base. No other acute abnormalities. IMPRESSION: The study is limited due to positioning, particularly the lateral view. Opacity is not excluded in the left base. The mediastinum is more prominent in the interval. Whether this is a true finding or due to positioning is unclear. Recommend either a better position chest x-ray when the patient is able or a CT scan for better evaluation. Electronically Signed   By: Dorise Bullion III M.D   On: 03/07/2016 20:55   Ct Chest W Contrast  Result Date: 03/08/2016 CLINICAL DATA:  Fatigue for month,  hemoptysis. Assess for pneumonia. History atrial fibrillation, bronchitis, hypertension. EXAM: CT CHEST WITH CONTRAST TECHNIQUE: Multidetector CT imaging of the chest was performed during intravenous contrast administration. CONTRAST:  78mL ISOVUE-300 IOPAMIDOL (ISOVUE-300) INJECTION 61% COMPARISON:  Chest radiograph March 07, 2016 at 2034 hours FINDINGS: CARDIOVASCULAR: Heart size is mildly enlarged. Mild coronary artery calcifications. Small amount of low-density free fluid in the central sulcus. No pericardial effusions. Thoracic aorta is normal in course and caliber with moderate calcific atherosclerosis in intimal thickening. MEDIASTINUM/NODES: No mediastinal mass. Prominent mediastinal fat. No lymphadenopathy by CT size criteria. Normal appearance of thoracic esophagus though not tailored for evaluation. LUNGS/PLEURA: Tracheobronchial tree is patent, no pneumothorax. Mild bronchial wall thickening. 2 mm calcified granuloma RIGHT middle lobe. Bibasilar dependent atelectasis and trace pleural effusions. Patchy enhancing consolidation LEFT lung base with air bronchograms. Irregular 10 x 7 mm LEFT upper lobe, apical posterior segment, axial 32/145. Minimal bibasilar calcified pleural plaques. UPPER ABDOMEN: Status post cholecystectomy. Diffusely hypodense liver compatible with steatosis. Small hiatal hernia. MUSCULOSKELETAL: Included view of the neck demonstrates medially deviated RIGHT crack or arachnoid cartilage and cord, query vocal cord paralysis. Suture anchors RIGHT humeral head. Sub cm sclerotic focus LEFT posterior eighth rib, likely bone island. Old RIGHT anterior rib fractures. No acute osseous process . Old moderate T9 compression fracture. Severe degenerative change of the included cervical spine. IMPRESSION: Bronchial wall thickening can be seen with bronchitis or reactive airway disease. Bibasilar atelectasis, less likely pneumonia. Minimal bibasilar calcified pleural plaques and trace pleural  effusions. **An incidental finding of potential clinical significance has been found. 10 mm LEFT upper lobe pulmonary nodule ; recommend follow-up CT or PET- CT at 3 months.** Electronically Signed   By: Elon Alas M.D.   On: 03/08/2016 01:25   Dg Chest Port 1 View  Result Date: 03/08/2016 CLINICAL DATA:  Sepsis. EXAM: PORTABLE CHEST 1 VIEW COMPARISON:  03/07/2016 FINDINGS: Shallow inspiration. Mild cardiac enlargement. Peribronchial thickening with central interstitial changes consistent with chronic bronchitis. Probable atelectasis in the left lung base. No focal airspace disease or consolidation in the lungs. No blunting of costophrenic angles. No pneumothorax. Calcified and tortuous aorta. IMPRESSION: Shallow inspiration with atelectasis in the left lung base. Chronic bronchitic changes. No evidence of active pulmonary disease. Electronically Signed   By: Lucienne Capers M.D.   On: 03/08/2016 04:08

## 2016-03-09 NOTE — Progress Notes (Signed)
PROGRESS NOTE  Jonathan [name]  [id number] DOB: 09/19/1932  DOA: 03/07/2016 PCP: Thressa Sheller, MD   Brief Narrative:  82 y.o. male with medical history significant of COPD on home oxygen (2 L /min only at bedtime), pneumonia 3, paroxysmal atrial fibrillation on Eliquis, BPH, cataract, glaucoma, depression, fibromyalgia, GERD, gout, hypertension, hypothyroidism, sleep apnea on CPAP, type 2 diabetes on diet who comes to the emergency department via EMS with complaints of generalized weakness, fatigue, body aches for a week prior to admission and then on the day of admission he developed a bout of coughing spells, chills and fever. Reported some dysuria and left earache. Admitted for presumed sepsis and possible pneumonia.   Assessment & Plan:   Principal Problem:   Sepsis ([place]) Active Problems:   COPD (chronic obstructive pulmonary disease) (HCC)   Sleep apnea, obstructive   Hypertension   Hypothyroidism   GERD (gastroesophageal reflux disease)   Gout   Atrial fibrillation (HCC)   Glaucoma   Penile swelling   Tinea cruris   Type 2 diabetes mellitus (HCC)   Sepsis, possibly from community-acquired pneumonia versus acute bronchitis - Initially felt that his presentation was consistent with pneumonia. CT chest showed bronchitis, bibasal atelectasis-less likely pneumonia. Marked leukocytosis: 27K/neutrophilia with target cells and bandemia. - Repeat chest x-ray 9/24: Shallow inspiration with atelectasis in the left lung base. Chronic bronchitic changes. No evidence of active pulmonary disease. - Previous history of pneumonia 3. Urine microscopy not suggestive of UTI. - Treated with IV fluids, IV vancomycin and Zosyn  - Clinically improved. Sepsis physiology resolved. Blood cultures 2: Negative to date. Lactate has normalized. - Monitor closely. Continue additional 24 hours of IV antibiotics and then transitioned to oral antibiotics at discharge. Outpatient follow-up with primary  pulmonologist to evaluate for etiology of recurrent pneumonias. Will get swallow evaluation to rule out dysphagia and silent aspiration. Patient has defervesced without fevers since admission. Leukocytosis is better (27.7 > 17.6) but still elevated. Follow CBC in a.m. - Blood cultures 2: Negative to date. Urine culture negative.  Oxygen dependent COPD - Stable without clinical bronchospasm. Uses oxygen 2 L/m only at bedtime along with CPAP.  OSA on nightly CPAP  Pulmonary Nodule (10 mm left upper lobe) - recommended OP follow up with CT or PET CT in 3 months and patient verbalized understanding. - Discussed with patient's primary pulmonologist Dr. Lamonte Sakai and this will be addressed during outpatient follow-up.  Essential HTN - controlled. Continue Cardizem CD and Cardura.  Hypothyroid - Continue Synthroid.  Type II DM - SSI. Reasonable inpatient control.  GERD - PPI.  Paroxysmal atrial fibrillation - Currently in sinus rhythm-SB in 50's. Continue Cardizem and Apixaban  Thrombocytopenia Unclear etiology.? Secondary to acute illness/sepsis picture. Platelet counts 144 > 129 > 112. Follow CBC in a.m.    DVT prophylaxis: on Eliquis AC Code Status: Full Family Communication: Discussed in detail with patient's spouse at bedside.  Disposition Plan: DC home when medically improved, possibly in 1-2 days   Consultants:   None  Procedures:   None  Antimicrobials:   IV Vancomycin  IV Zosyn    Subjective: Chronic generalized aches and pains-unchanged from baseline. Denies dyspnea, chest pain or fevers.  Objective:  Vitals:   03/08/16 2131 03/09/16 0452 03/09/16 0945 03/09/16 1038  BP: (!) 124/59 (!) 127/58  116/71  Pulse: 72 68    Resp: 18 18    Temp: 98.9 F (37.2 C) 99.2 F (37.3 C)    TempSrc: Oral Oral  SpO2: 93% 95% 92%   Weight:      Height:        Intake/Output Summary (Last 24 hours) at 03/09/16 1300 Last data filed at 03/09/16 0835  Gross per 24  hour  Intake          1323.75 ml  Output              550 ml  Net           773.75 ml   Filed Weights   03/07/16 2005 03/08/16 0417  Weight: 95.3 kg (210 lb) 98 kg (216 lb)    Examination:  General exam: Pleasant elderly male sitting up comfortably in chair. Oral mucosa moist Respiratory system: slightly diminished breath sounds in the bases but otherwise clear to auscultation. Respiratory effort normal. Cardiovascular system: S1 & S2 heard, RRR. No JVD, murmurs, rubs, gallops or clicks. No pedal edema. telemetry: Sinus rhythm - SB in 50's.  Gastrointestinal system: Abdomen is nondistended, soft and nontender. No organomegaly or masses felt. Normal bowel sounds heard. Central nervous system: Alert and oriented. No focal neurological deficits. Extremities: Symmetric 5 x 5 power. Skin: No rashes, lesions or ulcers Psychiatry: Judgement and insight appear normal. Mood & affect appropriate.     Data Reviewed: I have personally reviewed following labs and imaging studies  CBC:  Recent Labs Lab 03/07/16 2007 03/08/16 0350 03/09/16 0535  WBC 17.4* 27.7* 17.6*  NEUTROABS 14.1* 21.9* 12.8*  HGB 14.5 13.1 12.9*  HCT 45.3 41.0 40.6  MCV 93.6 93.2 93.5  PLT 144* 129* [id number]*   Basic Metabolic Panel:  Recent Labs Lab 03/07/16 2007 03/08/16 0350  NA 137 140  K 3.7 3.9  CL 104 109  CO2 24 24  GLUCOSE 186* 146*  BUN 13 11  CREATININE 1.10 1.02  CALCIUM 9.0 7.9*  MG  --  1.9  PHOS  --  3.0   GFR: Estimated Creatinine Clearance: 64.5 mL/min (by C-G formula based on SCr of 1.02 mg/dL). Liver Function Tests:  Recent Labs Lab 03/07/16 2007 03/08/16 0350  AST 22 21  ALT 20 19  ALKPHOS 93 71  BILITOT 0.7 1.0  PROT 6.7 5.7*  ALBUMIN 4.0 3.4*   No results for input(s): LIPASE, AMYLASE in the last 168 hours. No results for input(s): AMMONIA in the last 168 hours. Coagulation Profile: No results for input(s): INR, PROTIME in the last 168 hours. Cardiac Enzymes: No  results for input(s): CKTOTAL, CKMB, CKMBINDEX, TROPONINI in the last 168 hours. BNP (last 3 results) No results for input(s): PROBNP in the last 8760 hours. HbA1C:  Recent Labs  03/08/16 0350  HGBA1C 6.3*   CBG:  Recent Labs Lab 03/08/16 1214 03/08/16 1649 03/08/16 2127 03/09/16 0804 03/09/16 1230  GLUCAP 143* 126* 117* 98 185*   Lipid Profile: No results for input(s): CHOL, HDL, LDLCALC, TRIG, CHOLHDL, LDLDIRECT in the last 72 hours. Thyroid Function Tests: No results for input(s): TSH, T4TOTAL, FREET4, T3FREE, THYROIDAB in the last 72 hours. Anemia Panel: No results for input(s): VITAMINB12, FOLATE, FERRITIN, TIBC, IRON, RETICCTPCT in the last 72 hours.  Sepsis Labs:  Recent Labs Lab 03/07/16 2042 03/07/16 2324 03/08/16 0350 03/08/16 0615  LATICACIDVEN 2.40* 2.50* 1.9 1.4    Recent Results (from the past 240 hour(s))  Blood Culture (routine x 2)     Status: None (Preliminary result)   Collection Time: 03/07/16  8:07 PM  Result Value Ref Range Status   Specimen Description BLOOD RIGHT ANTECUBITAL  Final  Special Requests BOTTLES DRAWN AEROBIC AND ANAEROBIC 5CC EA  Final   Culture NO GROWTH < 24 HOURS  Final   Report Status PENDING  Incomplete  Blood Culture (routine x 2)     Status: None (Preliminary result)   Collection Time: 03/07/16  8:14 PM  Result Value Ref Range Status   Specimen Description BLOOD LEFT HAND  Final   Special Requests BOTTLES DRAWN AEROBIC AND ANAEROBIC 5CC EA  Final   Culture NO GROWTH < 24 HOURS  Final   Report Status PENDING  Incomplete  Urine culture     Status: None   Collection Time: 03/07/16  8:15 PM  Result Value Ref Range Status   Specimen Description URINE, RANDOM  Final   Special Requests NONE  Final   Culture NO GROWTH  Final   Report Status 03/09/2016 FINAL  Final         Radiology Studies: Dg Chest 2 View  Result Date: 03/07/2016 CLINICAL DATA:  Generalized weakness. History of atrial fibrillation. EXAM: CHEST   2 VIEW COMPARISON:  June 25, 2015 FINDINGS: No pneumothorax. The lateral view is markedly limited due to positioning. Possible opacity in the left lung base. The heart size is normal. The mediastinum is prominent compared to the previous study. Whether this is a true finding or due to positioning is unclear. If there is concern, a CT scan for better position chest x-ray could better evaluate. The right lung is clear. Mild atelectasis in the right base. No other acute abnormalities. IMPRESSION: The study is limited due to positioning, particularly the lateral view. Opacity is not excluded in the left base. The mediastinum is more prominent in the interval. Whether this is a true finding or due to positioning is unclear. Recommend either a better position chest x-ray when the patient is able or a CT scan for better evaluation. Electronically Signed   By: Dorise Bullion III M.D   On: 03/07/2016 20:55   Ct Chest W Contrast  Result Date: 03/08/2016 CLINICAL DATA:  Fatigue for month, hemoptysis. Assess for pneumonia. History atrial fibrillation, bronchitis, hypertension. EXAM: CT CHEST WITH CONTRAST TECHNIQUE: Multidetector CT imaging of the chest was performed during intravenous contrast administration. CONTRAST:  74mL ISOVUE-300 IOPAMIDOL (ISOVUE-300) INJECTION 61% COMPARISON:  Chest radiograph March 07, 2016 at 2034 hours FINDINGS: CARDIOVASCULAR: Heart size is mildly enlarged. Mild coronary artery calcifications. Small amount of low-density free fluid in the central sulcus. No pericardial effusions. Thoracic aorta is normal in course and caliber with moderate calcific atherosclerosis in intimal thickening. MEDIASTINUM/NODES: No mediastinal mass. Prominent mediastinal fat. No lymphadenopathy by CT size criteria. Normal appearance of thoracic esophagus though not tailored for evaluation. LUNGS/PLEURA: Tracheobronchial tree is patent, no pneumothorax. Mild bronchial wall thickening. 2 mm calcified granuloma  RIGHT middle lobe. Bibasilar dependent atelectasis and trace pleural effusions. Patchy enhancing consolidation LEFT lung base with air bronchograms. Irregular 10 x 7 mm LEFT upper lobe, apical posterior segment, axial 32/145. Minimal bibasilar calcified pleural plaques. UPPER ABDOMEN: Status post cholecystectomy. Diffusely hypodense liver compatible with steatosis. Small hiatal hernia. MUSCULOSKELETAL: Included view of the neck demonstrates medially deviated RIGHT crack or arachnoid cartilage and cord, query vocal cord paralysis. Suture anchors RIGHT humeral head. Sub cm sclerotic focus LEFT posterior eighth rib, likely bone island. Old RIGHT anterior rib fractures. No acute osseous process . Old moderate T9 compression fracture. Severe degenerative change of the included cervical spine. IMPRESSION: Bronchial wall thickening can be seen with bronchitis or reactive airway disease. Bibasilar atelectasis, less  likely pneumonia. Minimal bibasilar calcified pleural plaques and trace pleural effusions. **An incidental finding of potential clinical significance has been found. 10 mm LEFT upper lobe pulmonary nodule ; recommend follow-up CT or PET- CT at 3 months.** Electronically Signed   By: Elon Alas M.D.   On: 03/08/2016 01:25   Dg Chest Port 1 View  Result Date: 03/08/2016 CLINICAL DATA:  Sepsis. EXAM: PORTABLE CHEST 1 VIEW COMPARISON:  03/07/2016 FINDINGS: Shallow inspiration. Mild cardiac enlargement. Peribronchial thickening with central interstitial changes consistent with chronic bronchitis. Probable atelectasis in the left lung base. No focal airspace disease or consolidation in the lungs. No blunting of costophrenic angles. No pneumothorax. Calcified and tortuous aorta. IMPRESSION: Shallow inspiration with atelectasis in the left lung base. Chronic bronchitic changes. No evidence of active pulmonary disease. Electronically Signed   By: Lucienne Capers M.D.   On: 03/08/2016 04:08         Scheduled Meds: . acetaminophen  1,000 mg Oral BID  . allopurinol  300 mg Oral QPM  . apixaban  5 mg Oral BID  . brimonidine  1 drop Both Eyes BID  . calcium carbonate  0.5 tablet Oral Daily  . clotrimazole   Topical BID  . diltiazem  180 mg Oral Daily  . doxazosin  8 mg Oral Daily  . DULoxetine  30 mg Oral Daily  . fluticasone  2 spray Each Nare Daily  . insulin aspart  0-9 Units Subcutaneous TID WC  . latanoprost  1 drop Both Eyes QHS  . levothyroxine  75 mcg Oral QAC breakfast  . loratadine  10 mg Oral Daily  . magnesium oxide  400 mg Oral Daily  . Melatonin  4.5 mg Oral QHS  . mometasone-formoterol  2 puff Inhalation BID  . multivitamin with minerals  1 tablet Oral Daily  . pantoprazole  40 mg Oral Daily  . piperacillin-tazobactam (ZOSYN)  IV  3.375 g Intravenous Q8H  . pregabalin  100 mg Oral QHS  . sodium chloride flush  3 mL Intravenous Q12H  . vancomycin  750 mg Intravenous Q12H   Continuous Infusions:     LOS: 2 days      [facility], MD [facility] Pager [phone]  If 7PM-7AM, please contact night-coverage [email or web address] Password [email or web address] 03/09/2016, 1:00 PM

## 2016-03-09 NOTE — Progress Notes (Signed)
Patient refuses CPAP at this time.  

## 2016-03-09 NOTE — Progress Notes (Signed)
Addendum  Patient c/o swelling and redness of penoscrotal region without itching or pain. Intermittent difficulty urinating and stream due to penile swelling. Occ dysuria. Not much improvement with topical treatment started on admission.  Examined at bedside Redness of penoscrotal area, moderate edema of penis and unable to easily retract. ~ 0.5 cm dark area on prepuce at 3 O'clock position with acute features. No localized tenderness, warmth, crepitus or fluctuation. Mild rash R groin.  A/P ? Fungal infection: Trial of PO Fluconazole. Urology consult. Discussed with RN.  Vernell Leep, MD, FACP, FHM. [facility] Pager [phone]  If 7PM-7AM, please contact night-coverage [email or web address] Password [facility] 03/09/2016, 6:37 PM

## 2016-03-09 NOTE — Consult Note (Signed)
[facility] CM Primary Care Navigator  03/09/2016  Jonathan [name] 03/19/1933 [id number]  Met with patient at the bedside to identify possible discharge needs.  Patient states fever, chills and increased coughing had led to this admission. Patient endorses Dr. Cyndi Lennert as the primary care provider.    Patient states he used [facility] to obtain medications and has recently switched to [employer] to save for medication expenses as stated. He verbalized managing his own medicines at home.  He drives self to doctors' appointments prior to admission. Patient lives with spouse but he is independent with self care as stated.  Patient voiced understanding to call primary care provider's office for a post discharge follow-up appointment within a week or sooner if needed. Patient letter provided for a reminder.        For additional questions please contact:  Edwena Felty A. Donni Oglesby, BSN, RN-BC [facility] PRIMARY CARE Navigator Cell: [phone]

## 2016-03-10 DIAGNOSIS — N4822 Cellulitis of corpus cavernosum and penis: Secondary | ICD-10-CM

## 2016-03-10 LAB — CBC WITH DIFFERENTIAL/PLATELET
Basophils Absolute: 0 10*3/uL (ref 0.0–0.1)
Basophils Relative: 0 %
Eosinophils Absolute: 0 10*3/uL (ref 0.0–0.7)
Eosinophils Relative: 0 %
HCT: 37.9 % — ABNORMAL LOW (ref 39.0–52.0)
Hemoglobin: 12.4 g/dL — ABNORMAL LOW (ref 13.0–17.0)
Lymphocytes Relative: 14 %
Lymphs Abs: 1.6 10*3/uL (ref 0.7–4.0)
MCH: 30 pg (ref 26.0–34.0)
MCHC: 32.7 g/dL (ref 30.0–36.0)
MCV: 91.8 fL (ref 78.0–100.0)
Monocytes Absolute: 2.1 10*3/uL — ABNORMAL HIGH (ref 0.1–1.0)
Monocytes Relative: 18 %
Neutro Abs: 8 10*3/uL — ABNORMAL HIGH (ref 1.7–7.7)
Neutrophils Relative %: 68 %
Platelets: 95 10*3/uL — ABNORMAL LOW (ref 150–400)
RBC: 4.13 MIL/uL — ABNORMAL LOW (ref 4.22–5.81)
RDW: 14.4 % (ref 11.5–15.5)
WBC: 11.7 10*3/uL — ABNORMAL HIGH (ref 4.0–10.5)

## 2016-03-10 LAB — GLUCOSE, CAPILLARY
Glucose-Capillary: 115 mg/dL — ABNORMAL HIGH (ref 65–99)
Glucose-Capillary: 174 mg/dL — ABNORMAL HIGH (ref 65–99)
Glucose-Capillary: 172 mg/dL — ABNORMAL HIGH (ref 65–99)
Glucose-Capillary: 103 mg/dL — ABNORMAL HIGH (ref 65–99)

## 2016-03-10 MED ORDER — DEXTROSE 5 % IV SOLN
2.0000 g | INTRAVENOUS | Status: DC
  Administered 2016-03-10 – 2016-03-11 (×2): 2 g via INTRAVENOUS
  Filled 2016-03-10 (×3): qty 2

## 2016-03-10 NOTE — Consult Note (Signed)
Urology Consult Note   Requesting Attending Physician:  Modena Jansky, MD Service Providing Consult: Urology Consulting Attending: Risa Grill, MD  Assessment:  Patient is a 82 y.o. male with extensive medical history, significant for BPH on cardura, A fib on Eliquis, COPD with chronic bronchitis & PNA, HTN, OSA, type II DM (HbA1c 6.5) admitted for presumed sepsis 2/2 PNA. Urology consulted for erythematous perineal process and penile lesion with penile edema. Currently on Vanc/Zosyn with WBC 17 --> 27 -- > 17. Urine & blood cultures NGTD. Cr stable. Physical exam appears consistent with scrotal, penile, perineal cellulitis with draining lesion at the 3 oclock position of the prepucial collar of the uncircumcised penis. Patient is spontaneously voiding. There are no areas of fluctuance or crepitus suggestive of abscess or Fournier's gangrene. May have element of bilateral epididymoorchitis as well.   Interval: AFVSS. Spontaneously voiding. WBC 11 today. CT pelvis w contrast performed last evening showing enlarged prostate, diffuse scrotal edema, ?epididymitis and orchitis, no soft tissue air or abscess. Perineal/GU physical exam shows no extension of erythema, somewhat more purulent drainage of 3oclock prepucial lesion with increasing surrounding erythema. Culture swab sent today of draining fluid.  Recommendations: 1. Ultimately defer antibiotic regimen to primary team but would recommend MRSA and anaerobic coverage in final choice 2. OK to discontinue PO diflucan - difficult to assess the nature of his perineal cellulitic in terms of bacterial vs fungal 3. Swab sent today from penile draining lesion for aerobic/anaerobic examination. If drainage continues tomorrow and/or surrounding erythema worsens or fluctuance develops we will consider bedside I&D. Follow up with hopes of tailoring antibiotic coverage 4. Continue to trend WBC, we will follow to serially examine perineum 5. Recommend inpatient  admission at least through tomorrow for another serial exam 6. Make NPO at midnight in small chance procedure or OR required 9/27. Please also hold eliquis. 7. Encourage warm compresses/towels, shower to allow spontaneous drainage of penile lesion  Thank you for this consult. Please contact the urology consult pager with any further questions/concerns. [name], MD Urology Surgical Resident  I performed a history and physical examination of the patient and discussed his management with the resident.  I reviewed the resident's note and agree with the documented findings and plan of care. 7 PM recheck: Continued edema and erythema. No progression from yesterday. No obvious significant undrained. Once. No evidence of crepitance or necrosis. No real improvement but no worsening over the last 24 hours. Possible surgical exploration tomorrow if clinical situation worsens.  Subjective: Doing well. Eating drinking [place]. No n/v, fever/pain.   Objective   Vital signs in last 24 hours: BP (!) 144/66 (BP Location: Left Arm)   Pulse 72   Temp 98.4 F (36.9 C) (Oral)   Resp 18   Ht 5\' 7"  (1.702 m)   Wt 216 lb (98 kg)   SpO2 94%   BMI 33.83 kg/m   Intake/Output last 3 shifts: I/O last 3 completed shifts: In: 1323.8 [I.V.:873.8; IV Piggyback:450] Out: 750 [Urine:750]  Physical Exam General: NAD, A&O, resting, appropriate, appears younger than stated age 66: [place]/AT, EOMI, MMM Pulmonary: Normal work of breathing on RA Cardiovascular: Regular rate & rhythm, HDS, adequate peripheral perfusion Abdomen: soft, NTTP, nondistended, no suprapubic fullness or tenderness, protuberant GU: voiding spontaneously, erythema and cellulitis changes to entire scrotum, penis and suprapubic fat pad with minor involvement tracing the bilateral spermatic cords, moderate to significant penile edema with inability to retract foreskin or visualize glans/urethral meatus, no areas of fluctuance noted,  no crepitus  noted, small area of draining purulent fluid at 3 o'clock of the prepuce which is moderately TTP and is surrounded by more beefy red tissue but again no fluctuance or areas of necrosis, surrounding erythema seems increased today with moderate purulent drainage manually palpated by urology at bedside, R > L TTP on testicular examination with induration R > L of spermatic cords Extremities: warm and well perfused, no edema Neuro: Appropriate, no focal neurological deficits  Most Recent Labs: Lab Results  Component Value Date   WBC 11.7 (H) 03/10/2016   HGB 12.4 (L) 03/10/2016   HCT 37.9 (L) 03/10/2016   PLT 95 (L) 03/10/2016    Lab Results  Component Value Date   NA 140 03/08/2016   K 3.9 03/08/2016   CL 109 03/08/2016   CO2 24 03/08/2016   BUN 11 03/08/2016   CREATININE 1.02 03/08/2016   CALCIUM 7.9 (L) 03/08/2016   MG 1.9 03/08/2016   PHOS 3.0 03/08/2016    Lab Results  Component Value Date   ALKPHOS 71 03/08/2016   BILITOT 1.0 03/08/2016   PROT 5.7 (L) 03/08/2016   ALBUMIN 3.4 (L) 03/08/2016   ALT 19 03/08/2016   AST 21 03/08/2016    Lab Results  Component Value Date   INR 1.00 07/20/2011   APTT 25 07/20/2011     Urine Culture: Negative   IMAGING: Ct Pelvis W Contrast  Result Date: 03/10/2016 CLINICAL DATA:  Acute onset of groin swelling. Assess for cellulitis or Fournier's gangrene. Initial encounter. EXAM: CT PELVIS WITH CONTRAST TECHNIQUE: Multidetector CT imaging of the pelvis was performed using the standard protocol following the bolus administration of intravenous contrast. CONTRAST:  180mL ISOVUE-300 IOPAMIDOL (ISOVUE-300) INJECTION 61% COMPARISON:  None. FINDINGS: Urinary Tract: The bladder is mildly distended and grossly unremarkable in appearance. Bowel: Scattered diverticulosis is noted along the distal descending and proximal sigmoid colon, without evidence of diverticulitis. The patient is status post appendectomy. Vascular/Lymphatic: Scattered  calcification is noted at the distal abdominal aorta and its branches. No pelvic sidewall lymphadenopathy is seen. No inguinal lymphadenopathy is identified. Reproductive: The prostate is enlarged, measuring 5.3 cm in transverse dimension, with mild heterogeneity. Other: Diffuse scrotal edema is noted. There appears to be edema about the right epididymis, with question of asymmetric prominence of the right testis, which may reflect right-sided epididymitis. Would correlate for any clinical symptoms to suggest orchitis. Soft tissue edema tracks along the penile shaft and also superiorly along the anterior lower pelvic wall. No soft tissue air is seen to suggest Fournier's gangrene. There is no evidence of abscess. Musculoskeletal: No acute osseous abnormalities are identified. Facet disease is noted at the lower lumbar spine, with vacuum phenomenon at L4-L5. The visualized musculature is grossly unremarkable in appearance. IMPRESSION: 1. Diffuse scrotal edema noted. Soft tissue edema tracks along the penile shaft and superiorly along the anterior lower pelvic wall. No soft tissue air seen. No evidence of abscess. 2. Edema noted about the right epididymis, with question of asymmetric prominence of the right testis. This may reflect right-sided epididymitis. Would correlate for any clinical symptoms to suggest orchitis. 3. Scattered diverticulosis along the distal descending and proximal sigmoid colon, without evidence of diverticulitis. 4. Scattered aortic atherosclerosis. 5. Enlarged prostate noted, with mild heterogeneity. Would correlate with PSA. Electronically Signed   By: Garald Balding M.D.   On: 03/10/2016 04:08

## 2016-03-10 NOTE — Evaluation (Signed)
Clinical/Bedside Swallow Evaluation Patient Details  Name: Jonathan [name] MRN: [id number] Date of Birth: [birth year]  Today's Date: 03/10/2016 Time: SLP Start Time (ACUTE ONLY): [id number] SLP Stop Time (ACUTE ONLY): 0840 SLP Time Calculation (min) (ACUTE ONLY): 17 min  Past Medical History:  Past Medical History:  Diagnosis Date  . Atrial fibrillation ([place])   . BPH (benign prostatic hyperplasia)   . Bronchitis   . Cataract   . COPD (chronic obstructive pulmonary disease) (HCC)    2 liters O2 HS  . Depression   . Fatty tumor    waste and back  . Fibromyalgia   . GERD (gastroesophageal reflux disease)   . Glaucoma    bilateral eyes  . Gout   . Hereditary and idiopathic peripheral neuropathy 08/28/2015  . Hypertension   . Hypothyroidism   . Hypoxia   . Insomnia   . Memory disorder 08/28/2015  . On home oxygen therapy    at night with cpap  . Osteoarthritis   . Peripheral neuropathy ([place])   . Rotator cuff tear, left   . Sleep apnea    uses cpap-add oxygen at night  . Spinal compression fracture (HCC) seventh vertebre  . Transient alteration of awareness 08/28/2015  . Type 2 diabetes mellitus ([place]) 03/08/2016  . Wears glasses    Past Surgical History:  Past Surgical History:  Procedure Laterality Date  . APPENDECTOMY  age 80  . CARPAL TUNNEL RELEASE Right    early 2000s  . CATARACT EXTRACTION     bilateral  . CHOLECYSTECTOMY  age 63  . EYE SURGERY    . FOOT ARTHRODESIS Right 02/02/2013   Procedure: RIGHT HALLUX METATARSAL PHALANGEAL JOINT ARTHRODESIS ;  Surgeon: Wylene Simmer, MD;  Location: [facility];  Service: Orthopedics;  Laterality: Right;  . INGUINAL HERNIA REPAIR  age 77   rt side  . NASAL CONCHA BULLOSA RESECTION  age 67  . PROSTATE SURGERY    . SHOULDER ARTHROSCOPY W/ ROTATOR CUFF REPAIR Right    early 2000s  . tonsil    . VASECTOMY  age 78   HPI:  82 y.o.malewith medical history significant of COPD on home oxygen, pneumonia 3, paroxysmal  atrial fibrillation on Eliquis, BPH, cataract, glaucoma, depression, fibromyalgia, GERD, gout, hypertension, hypothyroidism, sleep apnea on CPAP, type 2 diabetes on diet who comes to the emergency department via EMS with complaints of generalized weakness, fatigue, body aches for a week prior to admission and then on the day of admission he developed a bout of coughing spells, chills and fever. Reported some dysuria and left earache. Admitted for presumed sepsis and possible pneumonia. CT chest showed bronchitis, bibasal atelectasis-less likely pneumonia. Marked leukocytosis: 27K/neutrophilia with target cells and bandemia. Repeat chest x-ray 9/24: Shallow inspiration with atelectasis in the left lung base. Chronic bronchitic changes. No evidence of active pulmonary disease.  MD wants to refer to outpatient pumonology to determine source of recurrent pna. Wants to rule out silent aspiration.    Assessment / Plan / Recommendation Clinical Impression  Pt demosntrates no clinical findings suggestive of dysphagia. He denies any frequent coughing during or following meals, just one coughing episode that brought him to the ED, not associated with PO intake. Three oz water test conducted x2 with no negative result; swallow is timely and strong. Pt does report a history of GERD but denies recent symptoms and does not take any medication at this time. He does lay flat to sleep and has significant abdominal weight. Discussed basic  night time esophageal precautions. Would consider risk of silent aspiration of PO intake low based on presentation and would not recommend any further swallow testing except for maybe an esophagram which could both identify either reflux or silent aspiration of thin liquids. Will sign off at this time.     Aspiration Risk  Mild aspiration risk    Diet Recommendation Regular;Thin liquid   Liquid Administration via: Cup;Straw Medication Administration: Whole meds with liquid Supervision:  Patient able to self feed Postural Changes: Seated upright at 90 degrees;Remain upright for at least 30 minutes after po intake    Other  Recommendations Recommended Consults: Consider esophageal assessment Oral Care Recommendations: Patient independent with oral care;Oral care BID   Follow up Recommendations None      Frequency and Duration            Prognosis        Swallow Study   General HPI: 82 y.o.malewith medical history significant of COPD on home oxygen, pneumonia 3, paroxysmal atrial fibrillation on Eliquis, BPH, cataract, glaucoma, depression, fibromyalgia, GERD, gout, hypertension, hypothyroidism, sleep apnea on CPAP, type 2 diabetes on diet who comes to the emergency department via EMS with complaints of generalized weakness, fatigue, body aches for a week prior to admission and then on the day of admission he developed a bout of coughing spells, chills and fever. Reported some dysuria and left earache. Admitted for presumed sepsis and possible pneumonia. CT chest showed bronchitis, bibasal atelectasis-less likely pneumonia. Marked leukocytosis: 27K/neutrophilia with target cells and bandemia. Repeat chest x-ray 9/24: Shallow inspiration with atelectasis in the left lung base. Chronic bronchitic changes. No evidence of active pulmonary disease.  MD wants to refer to outpatient pumonology to determine source of recurrent pna. Wants to rule out silent aspiration.  Type of Study: Bedside Swallow Evaluation Previous Swallow Assessment: none Diet Prior to this Study: Regular;Thin liquids Temperature Spikes Noted: No Respiratory Status: Room air History of Recent Intubation: No Behavior/Cognition: Alert;Cooperative;Pleasant mood Oral Cavity Assessment: Within Functional Limits Oral Care Completed by SLP: No Oral Cavity - Dentition: Adequate natural dentition Vision: Functional for self-feeding Self-Feeding Abilities: Able to feed self Patient Positioning: Upright in  chair Baseline Vocal Quality: Normal Volitional Cough: Strong Volitional Swallow: Able to elicit    Oral/Motor/Sensory Function Overall Oral Motor/Sensory Function: Within functional limits   Ice Chips     Thin Liquid Thin Liquid: Within functional limits Presentation: Cup;Straw    Nectar Thick Nectar Thick Liquid: Not tested   Honey Thick Honey Thick Liquid: Not tested   Puree Puree: Within functional limits   Solid   GO   Solid: Within functional limits       [facility] [place], [place] CCC-SLP [id number]  Ziyanna Tolin, Katherene Ponto 03/10/2016,8:55 AM

## 2016-03-10 NOTE — Progress Notes (Addendum)
PROGRESS NOTE  Jonathan [name]  [id number] DOB: May 26, 1933  DOA: 03/07/2016 PCP: Thressa Sheller, MD   Brief Narrative:  82 y.o. male with medical history significant of COPD on home oxygen (2 L /min only at bedtime), pneumonia 3, paroxysmal atrial fibrillation on Eliquis, BPH, cataract, glaucoma, depression, fibromyalgia, GERD, gout, hypertension, hypothyroidism, sleep apnea on CPAP, type 2 diabetes on diet who comes to the emergency department via EMS with complaints of generalized weakness, fatigue, body aches for a week prior to admission and then on the day of admission he developed a bout of coughing spells, chills and fever. Reported some dysuria and left earache. Admitted for presumed sepsis and possible pneumonia > improving. Penocrotal region cellulitis-urology consulting.   Assessment & Plan:   Principal Problem:   Sepsis ([place]) Active Problems:   COPD (chronic obstructive pulmonary disease) (HCC)   Sleep apnea, obstructive   Hypertension   Hypothyroidism   GERD (gastroesophageal reflux disease)   Gout   Atrial fibrillation (HCC)   Glaucoma   Penile swelling   Tinea cruris   Type 2 diabetes mellitus (HCC)   Sepsis, possibly from community-acquired pneumonia versus acute bronchitis - Initially felt that his presentation was consistent with pneumonia. CT chest showed bronchitis, bibasal atelectasis-less likely pneumonia. Marked leukocytosis: 27K/neutrophilia with target cells and bandemia. - Repeat chest x-ray 9/24: Shallow inspiration with atelectasis in the left lung base. Chronic bronchitic changes. No evidence of active pulmonary disease. - Previous history of pneumonia 3. Urine microscopy not suggestive of UTI. - Treated initially with IV fluids, IV vancomycin and Zosyn  - Clinically improved. Sepsis physiology resolved. Blood cultures 2: Negative to date. Lactate has normalized. - Speech therapy evaluated and recommended a regular consistency diet and thin  liquids. - Outpatient follow-up with primary pulmonologist to evaluate for etiology of recurrent pneumonias.  - Patient has defervesced without fevers since admission. Leukocytosis is better (27.7 > 17.6 >11).  - Blood cultures 2: Negative to date. Urine culture negative.  Penoscrotal, perineal cellulitis with draining ulcer on the prepuce - Urology consultation appreciated. Discussed care with Dr. Risa Grill this am> no imminent indication for surgery and hence may continue Eliquis, not suggestive of fungal infection and hence recommended discontinuing fluconazole and stated that urology will follow. - CT pelvis results appreciated. Diffuse scrotal edema. No soft tissue air or abscess. - After discussing with urology this morning and with pharmacy, changed antibiotics to IV ceftriaxone and continued IV vancomycin. Clindamycin, fluconazole and Lotrimin cream were discontinued. - Discussed with Urologist on call Dr. Louis Meckel: continue current Abx as above, hold AC in case intervention needed in am and they will see patient early AM.  Oxygen dependent COPD - Stable without clinical bronchospasm. Uses oxygen 2 L/m only at bedtime along with CPAP.  OSA on nightly CPAP  Pulmonary Nodule (10 mm left upper lobe) - recommended OP follow up with CT or PET CT in 3 months and patient verbalized understanding. - Discussed with patient's primary pulmonologist Dr. Lamonte Sakai and this will be addressed during outpatient follow-up.  Essential HTN - controlled. Continue Cardizem CD and Cardura.  Hypothyroid - Continue Synthroid.  Type II DM - SSI. Reasonable inpatient control. A1c 6.3 suggesting good outpatient control.  GERD - PPI.  Paroxysmal atrial fibrillation - Currently in sinus rhythm-SB in 50's. Continue Cardizem. Apixaban held from 9/26 PM in case surgical intervention is needed in a.m. May consider IV heparin infusion in a.m. pending resumption of Apixaban.  Thrombocytopenia Unclear etiology.?  Secondary to acute illness/sepsis picture and  antibiotics/Zosyn. Platelet counts 144 > 129 > 112 >95. Has not received heparin products this admission. Zosyn discontinued. Follow CBC in a.m.    DVT prophylaxis: on Eliquis AC Code Status: Full Family Communication: None at bedside. Discussed with patient's daughter Dr. Thornton Park, updated care and answered questions..  Disposition Plan: DC home when medically improved.   Consultants:   Urology  Procedures:   None  Antimicrobials:   IV Vancomycin  IV Zosyn-Dc'ed  PO Fluconazole one dose 9/25> DC'ed  IV Ceftriaxone   Subjective: Denies dyspnea, chest pain or fevers. Mild pain in gentle area only on touching. No dysuria.   Objective:  Vitals:   03/09/16 2052 03/10/16 0516 03/10/16 0939 03/10/16 1341  BP: (!) 157/69 (!) 144/66  (!) 151/69  Pulse: 76 72  74  Resp: 18 18  15   Temp: 98.6 F (37 C) 98.4 F (36.9 C)  97.9 F (36.6 C)  TempSrc: Oral Oral  Oral  SpO2: 93% 98% 94% 99%  Weight:      Height:        Intake/Output Summary (Last 24 hours) at 03/10/16 1737 Last data filed at 03/10/16 0900  Gross per 24 hour  Intake              220 ml  Output              575 ml  Net             -355 ml   Filed Weights   03/07/16 2005 03/08/16 0417  Weight: 95.3 kg (210 lb) 98 kg (216 lb)    Examination:  General exam: Pleasant elderly male sitting up comfortably in chair. Oral mucosa moist Respiratory system: slightly diminished breath sounds in the bases but otherwise clear to auscultation. Respiratory effort normal. Cardiovascular system: S1 & S2 heard, RRR. No JVD, murmurs, rubs, gallops or clicks. No pedal edema. telemetry: Sinus rhythm - SB in 50's.  Gastrointestinal system: Abdomen is nondistended, soft and nontender. No organomegaly or masses felt. Normal bowel sounds heard. External genitalia: Erythema of penis, scrotum and pubic region appears less compared to last night. Scrotal edema either is  unchanged or slightly worse. No tenderness, fluctuation or crepitus. Ulcer at 3:00 position of prepuce draining.  Central nervous system: Alert and oriented. No focal neurological deficits. Extremities: Symmetric 5 x 5 power. Skin: No rashes, lesions or ulcers Psychiatry: Judgement and insight appear normal. Mood & affect appropriate.     Data Reviewed: I have personally reviewed following labs and imaging studies  CBC:  Recent Labs Lab 03/07/16 2007 03/08/16 0350 03/09/16 0535 03/10/16 0530  WBC 17.4* 27.7* 17.6* 11.7*  NEUTROABS 14.1* 21.9* 12.8* 8.0*  HGB 14.5 13.1 12.9* 12.4*  HCT 45.3 41.0 40.6 37.9*  MCV 93.6 93.2 93.5 91.8  PLT 144* 129* 112* 95*   Basic Metabolic Panel:  Recent Labs Lab 03/07/16 2007 03/08/16 0350  NA 137 140  K 3.7 3.9  CL 104 109  CO2 24 24  GLUCOSE 186* 146*  BUN 13 11  CREATININE 1.10 1.02  CALCIUM 9.0 7.9*  MG  --  1.9  PHOS  --  3.0   GFR: Estimated Creatinine Clearance: 64.5 mL/min (by C-G formula based on SCr of 1.02 mg/dL). Liver Function Tests:  Recent Labs Lab 03/07/16 2007 03/08/16 0350  AST 22 21  ALT 20 19  ALKPHOS 93 71  BILITOT 0.7 1.0  PROT 6.7 5.7*  ALBUMIN 4.0 3.4*   No results for input(s):  LIPASE, AMYLASE in the last 168 hours. No results for input(s): AMMONIA in the last 168 hours. Coagulation Profile: No results for input(s): INR, PROTIME in the last 168 hours. Cardiac Enzymes: No results for input(s): CKTOTAL, CKMB, CKMBINDEX, TROPONINI in the last 168 hours. BNP (last 3 results) No results for input(s): PROBNP in the last 8760 hours. HbA1C:  Recent Labs  03/08/16 0350  HGBA1C 6.3*   CBG:  Recent Labs Lab 03/09/16 1659 03/09/16 2049 03/10/16 0746 03/10/16 1203 03/10/16 1652  GLUCAP 140* 176* 115* 174* 172*   Lipid Profile: No results for input(s): CHOL, HDL, LDLCALC, TRIG, CHOLHDL, LDLDIRECT in the last 72 hours. Thyroid Function Tests: No results for input(s): TSH, T4TOTAL, FREET4,  T3FREE, THYROIDAB in the last 72 hours. Anemia Panel: No results for input(s): VITAMINB12, FOLATE, FERRITIN, TIBC, IRON, RETICCTPCT in the last 72 hours.  Sepsis Labs:  Recent Labs Lab 03/07/16 2042 03/07/16 2324 03/08/16 0350 03/08/16 0615  LATICACIDVEN 2.40* 2.50* 1.9 1.4    Recent Results (from the past 240 hour(s))  Blood Culture (routine x 2)     Status: None (Preliminary result)   Collection Time: 03/07/16  8:07 PM  Result Value Ref Range Status   Specimen Description BLOOD RIGHT ANTECUBITAL  Final   Special Requests BOTTLES DRAWN AEROBIC AND ANAEROBIC 5CC EA  Final   Culture NO GROWTH 3 DAYS  Final   Report Status PENDING  Incomplete  Blood Culture (routine x 2)     Status: None (Preliminary result)   Collection Time: 03/07/16  8:14 PM  Result Value Ref Range Status   Specimen Description BLOOD LEFT HAND  Final   Special Requests BOTTLES DRAWN AEROBIC AND ANAEROBIC 5CC EA  Final   Culture NO GROWTH 3 DAYS  Final   Report Status PENDING  Incomplete  Urine culture     Status: None   Collection Time: 03/07/16  8:15 PM  Result Value Ref Range Status   Specimen Description URINE, RANDOM  Final   Special Requests NONE  Final   Culture NO GROWTH  Final   Report Status 03/09/2016 FINAL  Final  Aerobic Culture (superficial specimen)     Status: None (Preliminary result)   Collection Time: 03/10/16  2:33 PM  Result Value Ref Range Status   Specimen Description PENIS  Final   Special Requests Normal  Final   Gram Stain   Final    MANY WBC PRESENT,BOTH PMN AND MONONUCLEAR MODERATE GRAM POSITIVE COCCI IN CLUSTERS IN PAIRS    Culture PENDING  Incomplete   Report Status PENDING  Incomplete         Radiology Studies: Ct Pelvis W Contrast  Result Date: 03/10/2016 CLINICAL DATA:  Acute onset of groin swelling. Assess for cellulitis or Fournier's gangrene. Initial encounter. EXAM: CT PELVIS WITH CONTRAST TECHNIQUE: Multidetector CT imaging of the pelvis was performed  using the standard protocol following the bolus administration of intravenous contrast. CONTRAST:  174mL ISOVUE-300 IOPAMIDOL (ISOVUE-300) INJECTION 61% COMPARISON:  None. FINDINGS: Urinary Tract: The bladder is mildly distended and grossly unremarkable in appearance. Bowel: Scattered diverticulosis is noted along the distal descending and proximal sigmoid colon, without evidence of diverticulitis. The patient is status post appendectomy. Vascular/Lymphatic: Scattered calcification is noted at the distal abdominal aorta and its branches. No pelvic sidewall lymphadenopathy is seen. No inguinal lymphadenopathy is identified. Reproductive: The prostate is enlarged, measuring 5.3 cm in transverse dimension, with mild heterogeneity. Other: Diffuse scrotal edema is noted. There appears to be edema about the  right epididymis, with question of asymmetric prominence of the right testis, which may reflect right-sided epididymitis. Would correlate for any clinical symptoms to suggest orchitis. Soft tissue edema tracks along the penile shaft and also superiorly along the anterior lower pelvic wall. No soft tissue air is seen to suggest Fournier's gangrene. There is no evidence of abscess. Musculoskeletal: No acute osseous abnormalities are identified. Facet disease is noted at the lower lumbar spine, with vacuum phenomenon at L4-L5. The visualized musculature is grossly unremarkable in appearance. IMPRESSION: 1. Diffuse scrotal edema noted. Soft tissue edema tracks along the penile shaft and superiorly along the anterior lower pelvic wall. No soft tissue air seen. No evidence of abscess. 2. Edema noted about the right epididymis, with question of asymmetric prominence of the right testis. This may reflect right-sided epididymitis. Would correlate for any clinical symptoms to suggest orchitis. 3. Scattered diverticulosis along the distal descending and proximal sigmoid colon, without evidence of diverticulitis. 4. Scattered  aortic atherosclerosis. 5. Enlarged prostate noted, with mild heterogeneity. Would correlate with PSA. Electronically Signed   By: Garald Balding M.D.   On: 03/10/2016 04:08        Scheduled Meds: . acetaminophen  1,000 mg Oral BID  . allopurinol  300 mg Oral QPM  . apixaban  5 mg Oral BID  . brimonidine  1 drop Both Eyes BID  . calcium carbonate  0.5 tablet Oral Daily  . cefTRIAXone (ROCEPHIN)  IV  2 g Intravenous Q24H  . diltiazem  180 mg Oral Daily  . doxazosin  8 mg Oral Daily  . DULoxetine  30 mg Oral Daily  . fluticasone  2 spray Each Nare Daily  . insulin aspart  0-9 Units Subcutaneous TID WC  . latanoprost  1 drop Both Eyes QHS  . levothyroxine  75 mcg Oral QAC breakfast  . loratadine  10 mg Oral Daily  . magnesium oxide  400 mg Oral Daily  . Melatonin  4.5 mg Oral QHS  . mometasone-formoterol  2 puff Inhalation BID  . multivitamin with minerals  1 tablet Oral Daily  . pantoprazole  40 mg Oral Daily  . pregabalin  100 mg Oral QHS  . sodium chloride flush  3 mL Intravenous Q12H  . vancomycin  750 mg Intravenous Q12H   Continuous Infusions:     LOS: 3 days      [facility], MD [facility] Pager [phone]  If 7PM-7AM, please contact night-coverage [email or web address] Password [facility] 03/10/2016, 5:37 PM

## 2016-03-10 NOTE — Progress Notes (Signed)
Pharmacy Antibiotic Note  Jonathan [name] is a 82 y.o. male admitted on 03/07/2016 with generalized weakness.  Pt currently on IV antibiotics for a resolving PVA vs bronchitis and new penile cellulitis.  Pharmacy has been consulted for Vancomycin/Zosyn/Clindamycin dosing.  Today on rounds it was discussed with Dr. Algis Liming the idea to narrow antibiotic therapy with regards to PNAcoverage and continue coverage of cellulitis.  This was also communicated with Dr. Louis Meckel with urology.  The decision was made to narrow abx coverage to Vancomycin and Rocephin which will provide adequate coverage for cellulitis including MRSA.   Plan: Continue Vancomycin 750 IV every 12 hours.  Goal trough 10-15 mcg/mL.  Will order BMET with AM labs to assess renal function. Rocephin 2gm IV q24h. Discontinue Clindamycin and Zosyn.   Height: 5\' 7"  (170.2 cm) Weight: 216 lb (98 kg) IBW/kg (Calculated) : 66.1  Temp (24hrs), Avg:98.5 F (36.9 C), Min:98.4 F (36.9 C), Max:98.6 F (37 C)   Recent Labs Lab 03/07/16 2007 03/07/16 2042 03/07/16 2324 03/08/16 0350 03/08/16 0615 03/09/16 0535 03/10/16 0530  WBC 17.4*  --   --  27.7*  --  17.6* 11.7*  CREATININE 1.10  --   --  1.02  --   --   --   LATICACIDVEN  --  2.40* 2.50* 1.9 1.4  --   --     Estimated Creatinine Clearance: 64.5 mL/min (by C-G formula based on SCr of 1.02 mg/dL).    No Known Allergies  Antimicrobials this admission: Vanc 9/23 >>  Zosyn 9/23 >> 9/26 Clinda 9/25 >> 9/26 Rocephin 9/26 >>  Dose adjustments this admission: none  Microbiology results: 9/23 BCx: ngtd 9/23 UCx: neg  Thank you for allowing pharmacy to be a part of this patient's care.  [employer], Pharm.D., BCPS Clinical Pharmacist Pager [phone] 03/10/2016 1:34 PM

## 2016-03-10 NOTE — Care Management Important Message (Signed)
Important Message  Patient Details  Name: Jonathan [name] MRN: [id number] Date of Birth: Dec 04, 1932   Medicare Important Message Given:  Yes    Zaylah Blecha Abena 03/10/2016, 11:17 AM

## 2016-03-11 ENCOUNTER — Telehealth: Payer: Self-pay | Admitting: Emergency Medicine

## 2016-03-11 DIAGNOSIS — I480 Paroxysmal atrial fibrillation: Secondary | ICD-10-CM

## 2016-03-11 DIAGNOSIS — A419 Sepsis, unspecified organism: Principal | ICD-10-CM

## 2016-03-11 DIAGNOSIS — L03315 Cellulitis of perineum: Secondary | ICD-10-CM

## 2016-03-11 DIAGNOSIS — I10 Essential (primary) hypertension: Secondary | ICD-10-CM

## 2016-03-11 LAB — BASIC METABOLIC PANEL WITH GFR
Anion gap: 11 (ref 5–15)
BUN: 9 mg/dL (ref 6–20)
CO2: 26 mmol/L (ref 22–32)
Calcium: 8.5 mg/dL — ABNORMAL LOW (ref 8.9–10.3)
Chloride: 106 mmol/L (ref 101–111)
Creatinine, Ser: 1.03 mg/dL (ref 0.61–1.24)
GFR calc Af Amer: >60 mL/min
GFR calc non Af Amer: >60 mL/min
Glucose, Bld: 110 mg/dL — ABNORMAL HIGH (ref 65–99)
Potassium: 3.2 mmol/L — ABNORMAL LOW (ref 3.5–5.1)
Sodium: 143 mmol/L (ref 135–145)

## 2016-03-11 LAB — CBC
HCT: 36 % — ABNORMAL LOW (ref 39.0–52.0)
Hemoglobin: 11.4 g/dL — ABNORMAL LOW (ref 13.0–17.0)
MCH: 29.2 pg (ref 26.0–34.0)
MCHC: 31.7 g/dL (ref 30.0–36.0)
MCV: 92.1 fL (ref 78.0–100.0)
Platelets: 132 10*3/uL — ABNORMAL LOW (ref 150–400)
RBC: 3.91 MIL/uL — ABNORMAL LOW (ref 4.22–5.81)
RDW: 14.3 % (ref 11.5–15.5)
WBC: 8 10*3/uL (ref 4.0–10.5)

## 2016-03-11 LAB — HEPARIN LEVEL (UNFRACTIONATED): Heparin Unfractionated: 0.69 IU/mL (ref 0.30–0.70)

## 2016-03-11 LAB — APTT: aPTT: 35 seconds (ref 24–36)

## 2016-03-11 LAB — GLUCOSE, CAPILLARY
Glucose-Capillary: 103 mg/dL — ABNORMAL HIGH (ref 65–99)
Glucose-Capillary: 173 mg/dL — ABNORMAL HIGH (ref 65–99)
Glucose-Capillary: 123 mg/dL — ABNORMAL HIGH (ref 65–99)
Glucose-Capillary: 130 mg/dL — ABNORMAL HIGH (ref 65–99)

## 2016-03-11 MED ORDER — POTASSIUM CHLORIDE CRYS ER 20 MEQ PO TBCR
40.0000 meq | EXTENDED_RELEASE_TABLET | Freq: Once | ORAL | Status: AC
  Administered 2016-03-11: 40 meq via ORAL
  Filled 2016-03-11: qty 2

## 2016-03-11 MED ORDER — HEPARIN BOLUS VIA INFUSION
4000.0000 [IU] | Freq: Once | INTRAVENOUS | Status: AC
  Administered 2016-03-11: 4000 [IU] via INTRAVENOUS
  Filled 2016-03-11: qty 4000

## 2016-03-11 MED ORDER — HEPARIN (PORCINE) IN NACL 100-0.45 UNIT/ML-% IJ SOLN
1250.0000 [IU]/h | INTRAMUSCULAR | Status: DC
  Administered 2016-03-11 – 2016-03-13 (×3): 1250 [IU]/h via INTRAVENOUS
  Filled 2016-03-11 (×3): qty 250

## 2016-03-11 NOTE — Progress Notes (Signed)
PROGRESS NOTE  Jonathan [name] [id number] DOB: [birth year] DOA: 03/07/2016 PCP: Thressa Sheller, MD  Brief History:  82 y.o.malewith medical history significant of COPD on home oxygen (2 L /min only at bedtime), pneumonia 3, paroxysmal atrial fibrillation on Eliquis, BPH, cataract, glaucoma, depression, fibromyalgia, GERD, gout, hypertension, hypothyroidism, sleep apnea on CPAP, type 2 diabetes on diet who comes to the emergency department via EMS with complaints of generalized weakness, fatigue, body aches for a week prior to admission and then on the day of admission he developed a bout of coughing spells, chills and fever. Reported some dysuria and left earache. Admitted for presumed sepsis and possible pneumonia > improving. Penocrotal region cellulitis-urology consulting.  Assessment/Plan: Sepsis,  from community-acquired pneumonia  And perineal cellulitis - I Marked leukocytosis: 27K/neutrophilia with target cells and bandemia. -03/08/2016 CT chest mild bronchial wall thickening with patchy enhancing consolidation in the left base with air bronchograms - Previous history of pneumonia 3. Urine microscopy not suggestive of UTI. - Treated initially with IV fluids, IV vancomycin and Zosyn  - Clinically improved. Sepsis physiology resolved. Blood cultures 2: Negative to date. Lactate has normalized. - Speech therapy evaluated and recommended a regular consistency diet and thin liquids. - Outpatient follow-up with primary pulmonologist to evaluate for etiology of recurrent pneumonias.  - Patient has defervesced without fevers since admission. Leukocytosis is better (27.7 > 17.6 >11).  - Blood cultures 2: Negative to date. Urine culture negative.  scrotal, perineal cellulitis with draining ulcer on the prepuce - Urology consultation appreciated. Discussed care with Dr. Risa Grill this am> no imminent indication for surgery and hence may continue Eliquis, not suggestive of fungal  infection and hence recommended discontinuing fluconazole and stated that urology will follow. - CT pelvis results appreciated. Diffuse scrotal edema. No soft tissue air or abscess. - After discussing with urology this morning and with pharmacy, changed antibiotics to IV ceftriaxone and continued IV vancomycin. Clindamycin, fluconazole and Lotrimin cream were discontinued. - 9/27--Discussed with Dr Delphina Cahill to start po abx on 9/28  Oxygen dependent COPD - Stable without clinical bronchospasm. Uses oxygen 2 L/m only at bedtime along with CPAP.  OSA on nightly CPAP  Pulmonary Nodule (10 mm left upper lobe) - recommended OP follow up with CT or PET CT in 3 months and patient verbalized understanding. - Discussed with patient's primary pulmonologist Dr. Lamonte Sakai and this will be addressed during outpatient follow-up.  Essential HTN - controlled. Continue Cardizem CD and Cardura.  Hypothyroid - Continue Synthroid.  Type II DM - SSI. Reasonable inpatient control. A1c 6.3 suggesting good outpatient control.  GERD - PPI.  Paroxysmal atrial fibrillation - Currently in sinus rhythm-SB in 50's. Continue Cardizem. Apixaban held from 9/26 PM in case surgical intervention is needed in a.m. -start IV heparin for now in case pt needs surgery -CHADSVASc =4  Thrombocytopenia - Secondary to acute illness/sepsis picture and antibiotics/Zosyn. Platelet counts 144 > 129 > 112 >95. Has not received heparin products this admission. Zosyn discontinued. Follow CBC in a.m.    DVT prophylaxis: IV heparin Code Status: Full Family Communication:updated wife at bedside 03/11/16--Total time spent 35 minutes.  Greater than 50% spent face to face counseling and coordinating care.  Disposition Plan: plan home on 9/28 if stable   Consultants:   Urology  Procedures:   None  Antimicrobials:   IV Vancomycin  IV Zosyn-Dc'ed  PO Fluconazole one dose 9/25> DC'ed  IV  Ceftriaxone  Subjective: Overall he is feeling better but continues to have pain in his perineum. His able to urinate without difficulty. Denies fevers, chills, chest pain, shortness breath, nausea, vomiting, diarrhea. No dysuria or hematuria. No abdominal pain.  Objective: Vitals:   03/11/16 0542 03/11/16 0737 03/11/16 1012 03/11/16 1154  BP: (!) 167/70 (!) 162/80 (!) 147/72   Pulse: 68 75 79   Resp: 18 18 18    Temp: 98.3 F (36.8 C) 98.9 F (37.2 C) 99.3 F (37.4 C)   TempSrc: Oral Oral Oral   SpO2: 91% 91% 92% 95%  Weight:      Height:        Intake/Output Summary (Last 24 hours) at 03/11/16 1834 Last data filed at 03/11/16 1012  Gross per 24 hour  Intake              590 ml  Output             1425 ml  Net             -835 ml   Weight change:  Exam:   General:  Pt is alert, follows commands appropriately, not in acute distress  HEENT: No icterus, No thrush, No neck mass, [place]/AT  Cardiovascular: RRR, S1/S2, no rubs, no gallops  Respiratory: CTA bilaterally, no wheezing, no crackles, no rhonchi  Abdomen: Soft/+BS, non tender, non distended, no guarding  Extremities: No edema, No lymphangitis, No petechiae, No rashes, no synovitis  Perineum--suprapubic erythema with edema of the penis. No crepitance. No necrosis.   Data Reviewed: I have personally reviewed following labs and imaging studies Basic Metabolic Panel:  Recent Labs Lab 03/07/16 2007 03/08/16 0350 03/11/16 0504  NA 137 140 143  K 3.7 3.9 3.2*  CL 104 109 106  CO2 24 24 26   GLUCOSE 186* 146* 110*  BUN 13 11 9   CREATININE 1.10 1.02 1.03  CALCIUM 9.0 7.9* 8.5*  MG  --  1.9  --   PHOS  --  3.0  --    Liver Function Tests:  Recent Labs Lab 03/07/16 2007 03/08/16 0350  AST 22 21  ALT 20 19  ALKPHOS 93 71  BILITOT 0.7 1.0  PROT 6.7 5.7*  ALBUMIN 4.0 3.4*   No results for input(s): LIPASE, AMYLASE in the last 168 hours. No results for input(s): AMMONIA in the last  168 hours. Coagulation Profile: No results for input(s): INR, PROTIME in the last 168 hours. CBC:  Recent Labs Lab 03/07/16 2007 03/08/16 0350 03/09/16 0535 03/10/16 0530 03/11/16 0504  WBC 17.4* 27.7* 17.6* 11.7* 8.0  NEUTROABS 14.1* 21.9* 12.8* 8.0*  --   HGB 14.5 13.1 12.9* 12.4* 11.4*  HCT 45.3 41.0 40.6 37.9* 36.0*  MCV 93.6 93.2 93.5 91.8 92.1  PLT 144* 129* 112* 95* 132*   Cardiac Enzymes: No results for input(s): CKTOTAL, CKMB, CKMBINDEX, TROPONINI in the last 168 hours. BNP: Invalid input(s): POCBNP CBG:  Recent Labs Lab 03/10/16 1652 03/10/16 2127 03/11/16 0758 03/11/16 1213 03/11/16 1704  GLUCAP 172* 103* 103* 173* 123*   HbA1C: No results for input(s): HGBA1C in the last 72 hours. Urine analysis:    Component Value Date/Time   COLORURINE YELLOW 03/07/2016 2015   APPEARANCEUR CLEAR 03/07/2016 2015   LABSPEC 1.025 03/07/2016 2015   PHURINE 6.0 03/07/2016 2015   GLUCOSEU NEGATIVE 03/07/2016 2015   HGBUR NEGATIVE 03/07/2016 2015   BILIRUBINUR NEGATIVE 03/07/2016 2015   BILIRUBINUR neg 11/23/2012 0942   KETONESUR 15 (A) 03/07/2016 2015  PROTEINUR 100 (A) 03/07/2016 2015   UROBILINOGEN 0.2 11/23/2012 0942   NITRITE NEGATIVE 03/07/2016 2015   LEUKOCYTESUR NEGATIVE 03/07/2016 2015   Sepsis Labs: @LABRCNTIP (procalcitonin:4,lacticidven:4) ) Recent Results (from the past 240 hour(s))  Blood Culture (routine x 2)     Status: None (Preliminary result)   Collection Time: 03/07/16  8:07 PM  Result Value Ref Range Status   Specimen Description BLOOD RIGHT ANTECUBITAL  Final   Special Requests BOTTLES DRAWN AEROBIC AND ANAEROBIC 5CC EA  Final   Culture NO GROWTH 4 DAYS  Final   Report Status PENDING  Incomplete  Blood Culture (routine x 2)     Status: None (Preliminary result)   Collection Time: 03/07/16  8:14 PM  Result Value Ref Range Status   Specimen Description BLOOD LEFT HAND  Final   Special Requests BOTTLES DRAWN AEROBIC AND ANAEROBIC 5CC EA   Final   Culture NO GROWTH 4 DAYS  Final   Report Status PENDING  Incomplete  Urine culture     Status: None   Collection Time: 03/07/16  8:15 PM  Result Value Ref Range Status   Specimen Description URINE, RANDOM  Final   Special Requests NONE  Final   Culture NO GROWTH  Final   Report Status 03/09/2016 FINAL  Final  Aerobic Culture (superficial specimen)     Status: None (Preliminary result)   Collection Time: 03/10/16  2:33 PM  Result Value Ref Range Status   Specimen Description PENIS  Final   Special Requests Normal  Final   Gram Stain   Final    MANY WBC PRESENT,BOTH PMN AND MONONUCLEAR MODERATE GRAM POSITIVE COCCI IN CLUSTERS IN PAIRS    Culture   Final    MODERATE STAPHYLOCOCCUS AUREUS SUSCEPTIBILITIES TO FOLLOW    Report Status PENDING  Incomplete     Scheduled Meds: . acetaminophen  1,000 mg Oral BID  . allopurinol  300 mg Oral QPM  . brimonidine  1 drop Both Eyes BID  . calcium carbonate  0.5 tablet Oral Daily  . cefTRIAXone (ROCEPHIN)  IV  2 g Intravenous Q24H  . diltiazem  180 mg Oral Daily  . doxazosin  8 mg Oral Daily  . DULoxetine  30 mg Oral Daily  . fluticasone  2 spray Each Nare Daily  . insulin aspart  0-9 Units Subcutaneous TID WC  . latanoprost  1 drop Both Eyes QHS  . levothyroxine  75 mcg Oral QAC breakfast  . loratadine  10 mg Oral Daily  . magnesium oxide  400 mg Oral Daily  . Melatonin  4.5 mg Oral QHS  . mometasone-formoterol  2 puff Inhalation BID  . multivitamin with minerals  1 tablet Oral Daily  . pantoprazole  40 mg Oral Daily  . pregabalin  100 mg Oral QHS  . sodium chloride flush  3 mL Intravenous Q12H  . vancomycin  750 mg Intravenous Q12H   Continuous Infusions:   Procedures/Studies: Dg Chest 2 View  Result Date: 03/07/2016 CLINICAL DATA:  Generalized weakness. History of atrial fibrillation. EXAM: CHEST  2 VIEW COMPARISON:  June 25, 2015 FINDINGS: No pneumothorax. The lateral view is markedly limited due to positioning.  Possible opacity in the left lung base. The heart size is normal. The mediastinum is prominent compared to the previous study. Whether this is a true finding or due to positioning is unclear. If there is concern, a CT scan for better position chest x-ray could better evaluate. The right lung is clear. Mild  atelectasis in the right base. No other acute abnormalities. IMPRESSION: The study is limited due to positioning, particularly the lateral view. Opacity is not excluded in the left base. The mediastinum is more prominent in the interval. Whether this is a true finding or due to positioning is unclear. Recommend either a better position chest x-ray when the patient is able or a CT scan for better evaluation. Electronically Signed   By: Dorise Bullion III M.D   On: 03/07/2016 20:55   Ct Chest W Contrast  Result Date: 03/08/2016 CLINICAL DATA:  Fatigue for month, hemoptysis. Assess for pneumonia. History atrial fibrillation, bronchitis, hypertension. EXAM: CT CHEST WITH CONTRAST TECHNIQUE: Multidetector CT imaging of the chest was performed during intravenous contrast administration. CONTRAST:  73mL ISOVUE-300 IOPAMIDOL (ISOVUE-300) INJECTION 61% COMPARISON:  Chest radiograph March 07, 2016 at 2034 hours FINDINGS: CARDIOVASCULAR: Heart size is mildly enlarged. Mild coronary artery calcifications. Small amount of low-density free fluid in the central sulcus. No pericardial effusions. Thoracic aorta is normal in course and caliber with moderate calcific atherosclerosis in intimal thickening. MEDIASTINUM/NODES: No mediastinal mass. Prominent mediastinal fat. No lymphadenopathy by CT size criteria. Normal appearance of thoracic esophagus though not tailored for evaluation. LUNGS/PLEURA: Tracheobronchial tree is patent, no pneumothorax. Mild bronchial wall thickening. 2 mm calcified granuloma RIGHT middle lobe. Bibasilar dependent atelectasis and trace pleural effusions. Patchy enhancing consolidation LEFT lung  base with air bronchograms. Irregular 10 x 7 mm LEFT upper lobe, apical posterior segment, axial 32/145. Minimal bibasilar calcified pleural plaques. UPPER ABDOMEN: Status post cholecystectomy. Diffusely hypodense liver compatible with steatosis. Small hiatal hernia. MUSCULOSKELETAL: Included view of the neck demonstrates medially deviated RIGHT crack or arachnoid cartilage and cord, query vocal cord paralysis. Suture anchors RIGHT humeral head. Sub cm sclerotic focus LEFT posterior eighth rib, likely bone island. Old RIGHT anterior rib fractures. No acute osseous process . Old moderate T9 compression fracture. Severe degenerative change of the included cervical spine. IMPRESSION: Bronchial wall thickening can be seen with bronchitis or reactive airway disease. Bibasilar atelectasis, less likely pneumonia. Minimal bibasilar calcified pleural plaques and trace pleural effusions. **An incidental finding of potential clinical significance has been found. 10 mm LEFT upper lobe pulmonary nodule ; recommend follow-up CT or PET- CT at 3 months.** Electronically Signed   By: Elon Alas M.D.   On: 03/08/2016 01:25   Ct Pelvis W Contrast  Result Date: 03/10/2016 CLINICAL DATA:  Acute onset of groin swelling. Assess for cellulitis or Fournier's gangrene. Initial encounter. EXAM: CT PELVIS WITH CONTRAST TECHNIQUE: Multidetector CT imaging of the pelvis was performed using the standard protocol following the bolus administration of intravenous contrast. CONTRAST:  133mL ISOVUE-300 IOPAMIDOL (ISOVUE-300) INJECTION 61% COMPARISON:  None. FINDINGS: Urinary Tract: The bladder is mildly distended and grossly unremarkable in appearance. Bowel: Scattered diverticulosis is noted along the distal descending and proximal sigmoid colon, without evidence of diverticulitis. The patient is status post appendectomy. Vascular/Lymphatic: Scattered calcification is noted at the distal abdominal aorta and its branches. No pelvic  sidewall lymphadenopathy is seen. No inguinal lymphadenopathy is identified. Reproductive: The prostate is enlarged, measuring 5.3 cm in transverse dimension, with mild heterogeneity. Other: Diffuse scrotal edema is noted. There appears to be edema about the right epididymis, with question of asymmetric prominence of the right testis, which may reflect right-sided epididymitis. Would correlate for any clinical symptoms to suggest orchitis. Soft tissue edema tracks along the penile shaft and also superiorly along the anterior lower pelvic wall. No soft tissue air is seen to suggest  Fournier's gangrene. There is no evidence of abscess. Musculoskeletal: No acute osseous abnormalities are identified. Facet disease is noted at the lower lumbar spine, with vacuum phenomenon at L4-L5. The visualized musculature is grossly unremarkable in appearance. IMPRESSION: 1. Diffuse scrotal edema noted. Soft tissue edema tracks along the penile shaft and superiorly along the anterior lower pelvic wall. No soft tissue air seen. No evidence of abscess. 2. Edema noted about the right epididymis, with question of asymmetric prominence of the right testis. This may reflect right-sided epididymitis. Would correlate for any clinical symptoms to suggest orchitis. 3. Scattered diverticulosis along the distal descending and proximal sigmoid colon, without evidence of diverticulitis. 4. Scattered aortic atherosclerosis. 5. Enlarged prostate noted, with mild heterogeneity. Would correlate with PSA. Electronically Signed   By: Garald Balding M.D.   On: 03/10/2016 04:08   Mr Brain Wo Contrast  Result Date: 03/06/2016 [facility] NEUROIMAGING REPORT STUDY DATE: 03/05/16 PATIENT NAME: Marieo Fipps DOB: 1932/09/11 MRN: [id number] ORDERING CLINICIAN: Kathrynn Ducking, MD CLINICAL HISTORY: 82 year old male with transient memory loss. EXAM: MRI brain (without) TECHNIQUE: MRI of the brain without contrast was obtained utilizing 5  mm axial slices with T1, T2, T2 flair, SWI and diffusion weighted views.  T1 sagittal and T2 coronal views were obtained. CONTRAST: no IMAGING SITE: [facility] [address] (3 Tesla MRI)  FINDINGS: No abnormal lesions are seen on diffusion-weighted views to suggest acute ischemia. The cortical sulci, fissures and cisterns are notable for mild generalized atrophy. Lateral, third and fourth ventricle are normal in size and appearance. No extra-axial fluid collections are seen. No evidence of mass effect or midline shift.  Minimal hazy periventricular and subcortical T2 hyperintensities. On sagittal views the posterior fossa, pituitary gland and corpus callosum are unremarkable. No evidence of intracranial hemorrhage on SWI views. The orbits and their contents, paranasal sinuses and calvarium are notable for bilateral lens extractions.  Intracranial flow voids are present.   Mildly abnormal MRI brain (without) demonstrating: 1. Mild generalized atrophy. 2. Mild periventricular and subcortical small vessel ischemic disease. 3. No acute findings. INTERPRETING PHYSICIAN: Penni Bombard, MD Certified in Neurology, Neurophysiology and Neuroimaging [facility] [address] [employer] [place], [place] [address] [phone]   Dg Chest Port 1 View  Result Date: 03/08/2016 CLINICAL DATA:  Sepsis. EXAM: PORTABLE CHEST 1 VIEW COMPARISON:  03/07/2016 FINDINGS: Shallow inspiration. Mild cardiac enlargement. Peribronchial thickening with central interstitial changes consistent with chronic bronchitis. Probable atelectasis in the left lung base. No focal airspace disease or consolidation in the lungs. No blunting of costophrenic angles. No pneumothorax. Calcified and tortuous aorta. IMPRESSION: Shallow inspiration with atelectasis in the left lung base. Chronic bronchitic changes. No evidence of active pulmonary disease. Electronically Signed   By: Lucienne Capers M.D.   On: 03/08/2016  04:08    Zetta Stoneman, DO  [facility] Pager [phone]  If 7PM-7AM, please contact night-coverage [email or web address] Password [facility] 03/11/2016, 6:34 PM   LOS: 4 days

## 2016-03-11 NOTE — Consult Note (Signed)
Urology Consult Note   Requesting Attending Physician:  Orson Eva, MD Service Providing Consult: Urology Consulting Attending: Risa Grill, MD  Assessment:   82 y.o. male with extensive medical history, significant for BPH on cardura, A fib on Eliquis, COPD with chronic bronchitis & PNA, HTN, OSA, type II DM (HbA1c 6.5) admitted for presumed sepsis 2/2 PNA. Urology consulted for erythematous perineal process and penile lesion with penile edema. Urine & blood cultures NGTD. Cr stable. Physical exam appears consistent with scrotal, penile, perineal cellulitis with draining lesion at the 3 oclock position of the prepucial collar of the uncircumcised penis. Patient is spontaneously voiding. There are no areas of fluctuance or crepitus suggestive of abscess or Fournier's gangrene. May have element of bilateral epididymoorchitis as well.   Interval: AFVSS. Voiding 1.8L spontaneously. WBC down to 8. Changed to IV vanc & CTX yesterday. Lesion continues to drain but decreasing amounts, is draining well, scrotal and suprapubic erythema improving. Preliminary results of culture swab showing Staph aureus as well as Gram + in chains/clusters.  Recommendations: 1. Defer antibiotic regimen to primary team but would favor change to PO antibiotic today with Staph coverage including MRSA 2. OK to discontinue diflucan 3. Follow up culture swab final results and sensitivities from 9/26 4. Continue to trend WBC, we will follow to serially examine perineum 5. No OR or intervention required today (including bedside abscess). Please continue holding eliquis if medically appropriate until discharge just in case penile lesion worsens prior to d/c. Patient given regular diet today 6. Encourage warm compresses/towels, warm shower to allow spontaneous drainage of penile lesion  Thank you for this consult. Please contact the urology consult pager with any further questions/concerns. [name], MD Urology Surgical  Resident  I performed a history and physical examination of the patient and discussed his management with the resident.  I reviewed the resident's note and agree with the documented findings and plan of care.  Patient was reassessed at 7 PM.  Case was also discussed with the hospitalist service.  No new complaints or concerns.  The erythema and edema are significantly improved.  Final culture results will be reviewed.  Conversion to oral antibiotics with hopefully discharge in the near future.  Subjective: Had trouble sleeping last night. No n/v. No pain currently. Continues to have some SOB. Some left shoulder pain earlier today. Thinks his GU swelling has decreased.  Objective   Vital signs in last 24 hours: BP (!) 162/80   Pulse 75   Temp 98.9 F (37.2 C) (Oral)   Resp 18   Ht 5\' 7"  (1.702 m)   Wt 216 lb (98 kg)   SpO2 91%   BMI 33.83 kg/m   Intake/Output last 3 shifts: I/O last 3 completed shifts: In: 41 [P.O.:220; IV Piggyback:350] Out: 1800 [Urine:1800]  Physical Exam General: NAD, A&O, resting, appropriate, appears younger than stated age 65: [place]/AT, EOMI, MMM Pulmonary: Normal work of breathing on RA Cardiovascular: Regular rate & rhythm, HDS, adequate peripheral perfusion Abdomen: soft, NTTP, nondistended, no suprapubic fullness or tenderness, protuberant GU: voiding spontaneously, erythema and cellulitis changes to entire scrotum, penis and suprapubic fat pad improving, moderate to significant penile edema with inability to retract foreskin or visualize glans/urethral meatus, no crepitus noted, small area of draining purulent fluid at 3 o'clock of the prepuce which is mildly TTP and is draining less than yesterday with surrounding erythema improving - some laxity of the underlying skin at this site likely secondary to fluid drained, no significant TTP on  testicular exam with some minor induration of the right spermatic cord  Extremities: warm and well perfused, no  edema Neuro: Appropriate, no focal neurological deficits  Most Recent Labs: Lab Results  Component Value Date   WBC 8.0 03/11/2016   HGB 11.4 (L) 03/11/2016   HCT 36.0 (L) 03/11/2016   PLT 132 (L) 03/11/2016    Lab Results  Component Value Date   NA 143 03/11/2016   K 3.2 (L) 03/11/2016   CL 106 03/11/2016   CO2 26 03/11/2016   BUN 9 03/11/2016   CREATININE 1.03 03/11/2016   CALCIUM 8.5 (L) 03/11/2016   MG 1.9 03/08/2016   PHOS 3.0 03/08/2016    Lab Results  Component Value Date   ALKPHOS 71 03/08/2016   BILITOT 1.0 03/08/2016   PROT 5.7 (L) 03/08/2016   ALBUMIN 3.4 (L) 03/08/2016   ALT 19 03/08/2016   AST 21 03/08/2016    Lab Results  Component Value Date   INR 1.00 07/20/2011   APTT 25 07/20/2011     Urine Culture: Negative   IMAGING: Ct Pelvis W Contrast  Result Date: 03/10/2016 CLINICAL DATA:  Acute onset of groin swelling. Assess for cellulitis or Fournier's gangrene. Initial encounter. EXAM: CT PELVIS WITH CONTRAST TECHNIQUE: Multidetector CT imaging of the pelvis was performed using the standard protocol following the bolus administration of intravenous contrast. CONTRAST:  124mL ISOVUE-300 IOPAMIDOL (ISOVUE-300) INJECTION 61% COMPARISON:  None. FINDINGS: Urinary Tract: The bladder is mildly distended and grossly unremarkable in appearance. Bowel: Scattered diverticulosis is noted along the distal descending and proximal sigmoid colon, without evidence of diverticulitis. The patient is status post appendectomy. Vascular/Lymphatic: Scattered calcification is noted at the distal abdominal aorta and its branches. No pelvic sidewall lymphadenopathy is seen. No inguinal lymphadenopathy is identified. Reproductive: The prostate is enlarged, measuring 5.3 cm in transverse dimension, with mild heterogeneity. Other: Diffuse scrotal edema is noted. There appears to be edema about the right epididymis, with question of asymmetric prominence of the right testis, which may  reflect right-sided epididymitis. Would correlate for any clinical symptoms to suggest orchitis. Soft tissue edema tracks along the penile shaft and also superiorly along the anterior lower pelvic wall. No soft tissue air is seen to suggest Fournier's gangrene. There is no evidence of abscess. Musculoskeletal: No acute osseous abnormalities are identified. Facet disease is noted at the lower lumbar spine, with vacuum phenomenon at L4-L5. The visualized musculature is grossly unremarkable in appearance. IMPRESSION: 1. Diffuse scrotal edema noted. Soft tissue edema tracks along the penile shaft and superiorly along the anterior lower pelvic wall. No soft tissue air seen. No evidence of abscess. 2. Edema noted about the right epididymis, with question of asymmetric prominence of the right testis. This may reflect right-sided epididymitis. Would correlate for any clinical symptoms to suggest orchitis. 3. Scattered diverticulosis along the distal descending and proximal sigmoid colon, without evidence of diverticulitis. 4. Scattered aortic atherosclerosis. 5. Enlarged prostate noted, with mild heterogeneity. Would correlate with PSA. Electronically Signed   By: Garald Balding M.D.   On: 03/10/2016 04:08

## 2016-03-11 NOTE — Telephone Encounter (Signed)
Spoke with Dr Tarri Glenn, learned that Jonathan [name] is admitted to [facility]. It sounds like he has had several exacerbations +/- PNA's since I last saw him. Also, note that his CT chest from the hospital has a pulmonary nodule that we will need to address. Please set him up for an OV soon. Thanks.

## 2016-03-11 NOTE — Telephone Encounter (Signed)
Pt is still currently admitted. Will wait until pt is discharged before calling to make appointment.

## 2016-03-11 NOTE — Progress Notes (Signed)
ANTICOAGULATION CONSULT NOTE - Initial Consult  Pharmacy Consult:  Heparin Indication: atrial fibrillation  No Known Allergies  Patient Measurements: Height: 5\' 7"  (170.2 cm) Weight: 216 lb (98 kg) IBW/kg (Calculated) : 66.1 Heparin Dosing Weight: 87 kg  Vital Signs: Temp: 99.3 F (37.4 C) (09/27 1012) Temp Source: Oral (09/27 1012) BP: 147/72 (09/27 1012) Pulse Rate: 79 (09/27 1012)  Labs:  Recent Labs  03/09/16 0535 03/10/16 0530 03/11/16 0504  HGB 12.9* 12.4* 11.4*  HCT 40.6 37.9* 36.0*  PLT 112* 95* 132*  CREATININE  --   --  1.03    Estimated Creatinine Clearance: 63.8 mL/min (by C-G formula based on SCr of 1.03 mg/dL).   Medical History: Past Medical History:  Diagnosis Date  . Atrial fibrillation ([place])   . BPH (benign prostatic hyperplasia)   . Bronchitis   . Cataract   . COPD (chronic obstructive pulmonary disease) ([facility])    2 liters O2 HS  . Depression   . Fatty tumor    waste and back  . Fibromyalgia   . GERD (gastroesophageal reflux disease)   . Glaucoma    bilateral eyes  . Gout   . Hereditary and idiopathic peripheral neuropathy 08/28/2015  . Hypertension   . Hypothyroidism   . Hypoxia   . Insomnia   . Memory disorder 08/28/2015  . On home oxygen therapy    at night with cpap  . Osteoarthritis   . Peripheral neuropathy ([place])   . Rotator cuff tear, left   . Sleep apnea    uses cpap-add oxygen at night  . Spinal compression fracture ([facility]) seventh vertebre  . Transient alteration of awareness 08/28/2015  . Type 2 diabetes mellitus ([place]) 03/08/2016  . Wears glasses      Assessment: 52 YOM admitted on 03/07/16 with sepsis/cellulitis.  Patient was on Eliquis from PTA for history of Afib and it was continued upon admission.  Eliquis was then placed on hold on 03/10/16 (last dose 03/09/16) for possible surgical intervention.  Pharmacy consulted to initiate IV heparin bridge.  Baseline aPTT WNL and heparin level is therapeutic.   Eliquis falsely  elevates heparin levels; therefore, will use aPTT to guide heparin dosing initially.  Platelet count has improved to 132.  No bleeding reported.   Goal of Therapy:  aPTT 66 - 102 seconds  HL 0.3 - 0.7 units/mL Monitor platelets by anticoagulation protocol: Yes    Plan:  - Heparin 4000 units IV bolus x 1, then - Heparin gtt at 1250 units/hr - Check 8 hr heparin level and aPTT - Daily heparin level, aPTT and CBC   Shaina Gullatt D. Mina Marble, PharmD, BCPS Pager:  [phone] 03/11/2016, 9:32 PM

## 2016-03-12 DIAGNOSIS — A4102 Sepsis due to Methicillin resistant Staphylococcus aureus: Secondary | ICD-10-CM

## 2016-03-12 LAB — CULTURE, BLOOD (ROUTINE X 2)
Culture: NO GROWTH
Culture: NO GROWTH

## 2016-03-12 LAB — BASIC METABOLIC PANEL
Anion gap: 9 (ref 5–15)
BUN: 11 mg/dL (ref 6–20)
CO2: 28 mmol/L (ref 22–32)
Calcium: 8.9 mg/dL (ref 8.9–10.3)
Chloride: 105 mmol/L (ref 101–111)
Creatinine, Ser: 1.01 mg/dL (ref 0.61–1.24)
GFR calc Af Amer: >60 mL/min (ref >60)
GFR calc non Af Amer: >60 mL/min (ref >60)
Glucose, Bld: 117 mg/dL — ABNORMAL HIGH (ref 65–99)
Potassium: 3.7 mmol/L (ref 3.5–5.1)
Sodium: 142 mmol/L (ref 135–145)

## 2016-03-12 LAB — CBC
HCT: 38.7 % — ABNORMAL LOW (ref 39.0–52.0)
Hemoglobin: 12.5 g/dL — ABNORMAL LOW (ref 13.0–17.0)
MCH: 29.8 pg (ref 26.0–34.0)
MCHC: 32.3 g/dL (ref 30.0–36.0)
MCV: 92.1 fL (ref 78.0–100.0)
Platelets: 152 10*3/uL (ref 150–400)
RBC: 4.2 MIL/uL — ABNORMAL LOW (ref 4.22–5.81)
RDW: 14.1 % (ref 11.5–15.5)
WBC: 6.8 10*3/uL (ref 4.0–10.5)

## 2016-03-12 LAB — APTT
aPTT: 93 seconds — ABNORMAL HIGH (ref 24–36)
aPTT: 69 seconds — ABNORMAL HIGH (ref 24–36)

## 2016-03-12 LAB — GLUCOSE, CAPILLARY
Glucose-Capillary: 103 mg/dL — ABNORMAL HIGH (ref 65–99)
Glucose-Capillary: 138 mg/dL — ABNORMAL HIGH (ref 65–99)
Glucose-Capillary: 159 mg/dL — ABNORMAL HIGH (ref 65–99)
Glucose-Capillary: 136 mg/dL — ABNORMAL HIGH (ref 65–99)

## 2016-03-12 LAB — MAGNESIUM: Magnesium: 2.2 mg/dL (ref 1.7–2.4)

## 2016-03-12 LAB — HEPARIN LEVEL (UNFRACTIONATED): Heparin Unfractionated: 0.91 IU/mL — ABNORMAL HIGH (ref 0.30–0.70)

## 2016-03-12 MED ORDER — DOXYCYCLINE HYCLATE 100 MG PO TABS
100.0000 mg | ORAL_TABLET | Freq: Two times a day (BID) | ORAL | Status: DC
  Administered 2016-03-12 – 2016-03-13 (×3): 100 mg via ORAL
  Filled 2016-03-12 (×3): qty 1

## 2016-03-12 NOTE — Consult Note (Signed)
Urology Consult Note   Requesting Attending Physician:  Orson Eva, MD Service Providing Consult: Urology Consulting Attending: Risa Grill, MD  Assessment:   82 y.o. male with extensive medical history, significant for BPH on cardura, A fib on Eliquis, COPD with chronic bronchitis & PNA, HTN, OSA, type II DM (HbA1c 6.5) admitted for presumed sepsis 2/2 PNA. Urology consulted for erythematous perineal process and penile lesion with penile edema. Urine & blood cultures NGTD. Cr stable. Physical exam appears consistent with scrotal, penile, perineal cellulitis with draining lesion at the 3 oclock position of the prepucial collar of the uncircumcised penis. Patient is spontaneously voiding. There are no areas of fluctuance or crepitus suggestive of abscess or Fournier's gangrene. May have element of bilateral epididymoorchitis as well. Culture swab of penile lesion suggests small penile MRSA abscess with surrounding cellulitis.  Interval: AFVSS, spontaneous voiding. WBC to 6. Still on IV vanc/CTX. Preliminary culture swab showing MRSA with sensitivities. GU physical exam continues to improve.  Recommendations: 1. Defer antibiotic regimen to primary team but would favor change to PO antibiotic (e.g. bactrim, clinda, doxy, etc) 2. Follow up culture swab final results and sensitivities from 9/26 3. We will follow to serially examine perineum while inpatient but feel comfortable that he does not require I&D or Or, and could be discharged when felt appropriate by medical team 4. No urology procedural intervention required. OK to resume eliquis. 5. Encourage warm compresses/towels, warm shower to allow spontaneous drainage of penile lesion  Thank you for this consult. Please contact the urology consult pager with any further questions/concerns. [name], MD Urology Surgical Resident    Subjective: Much better night. No n/v. Eating/drinking well. OOB. Some stabbing pain at site of penile draining  lesion currently.   Objective   Vital signs in last 24 hours: BP (!) 159/69   Pulse 76   Temp 98.3 F (36.8 C) (Oral)   Resp 18   Ht 5\' 7"  (1.702 m)   Wt 216 lb (98 kg)   SpO2 95%   BMI 33.83 kg/m   Intake/Output last 3 shifts: I/O last 3 completed shifts: In: 51 [P.O.:240; IV Piggyback:350] Out: 1425 [Urine:1425]  Physical Exam General: NAD, A&O, resting, appropriate, appears younger than stated age 58: [place]/AT, EOMI, MMM Pulmonary: Normal work of breathing on RA Cardiovascular: Regular rate & rhythm, HDS, adequate peripheral perfusion Abdomen: soft, NTTP, nondistended, no suprapubic fullness or tenderness, protuberant GU: voiding spontaneously, erythema and cellulitis changes to entire scrotum improving, penis and suprapubic fat pad erythema significantly improved, moderate to significant penile edema with inability to retract foreskin or visualize glans/urethral meatus, no crepitus noted, small area of draining purulent fluid at 3 o'clock of the prepuce which is mildly TTP and is draining less than yesterday and now more thin & serous - some laxity of the underlying skin at this site likely secondary to fluid drained, no significant TTP on testicular exam with some minor induration of the right spermatic cord  Extremities: warm and well perfused, no edema Neuro: Appropriate, no focal neurological deficits  Most Recent Labs: Lab Results  Component Value Date   WBC 6.8 03/12/2016   HGB 12.5 (L) 03/12/2016   HCT 38.7 (L) 03/12/2016   PLT 152 03/12/2016    Lab Results  Component Value Date   NA 142 03/12/2016   K 3.7 03/12/2016   CL 105 03/12/2016   CO2 28 03/12/2016   BUN 11 03/12/2016   CREATININE 1.01 03/12/2016   CALCIUM 8.9 03/12/2016   MG  2.2 03/12/2016   PHOS 3.0 03/08/2016    Lab Results  Component Value Date   ALKPHOS 71 03/08/2016   BILITOT 1.0 03/08/2016   PROT 5.7 (L) 03/08/2016   ALBUMIN 3.4 (L) 03/08/2016   ALT 19 03/08/2016   AST 21  03/08/2016    Lab Results  Component Value Date   INR 1.00 07/20/2011   APTT 93 (H) 03/12/2016     Urine Culture: Negative   IMAGING: CT pelvis personally reviewed.

## 2016-03-12 NOTE — Progress Notes (Signed)
ANTICOAGULATION CONSULT NOTE - Follow Up Consult  Pharmacy Consult for heparin Indication: atrial fibrillation  Labs:  Recent Labs  03/10/16 0530 03/11/16 0504 03/11/16 2004 03/12/16 0552  HGB 12.4* 11.4*  --  12.5*  HCT 37.9* 36.0*  --  38.7*  PLT 95* 132*  --  152  APTT  --   --  35 93*  HEPARINUNFRC  --   --  0.69 0.91*  CREATININE  --  1.03  --  1.01    Assessment/Plan:  82yo male therapeutic on heparin with initial dosing while Eliquis on hold. Will continue gtt at current rate and confirm stable with additional PTT.   Wynona Neat, PharmD, BCPS  03/12/2016,6:51 AM

## 2016-03-12 NOTE — Care Management Important Message (Signed)
Important Message  Patient Details  Name: Jonathan [name] MRN: [id number] Date of Birth: [birth year]   Medicare Important Message Given:  Yes    Avice [name] 03/12/2016, 3:29 PM

## 2016-03-12 NOTE — Progress Notes (Signed)
PROGRESS NOTE  Jonathan [name] [id number] DOB: 1933/06/06 DOA: 03/07/2016 PCP: Thressa Sheller, MD  Brief History:  82 y.o.malewith medical history significant of COPD on home oxygen (2 L /min only at bedtime), pneumonia 3, paroxysmal atrial fibrillation on Eliquis, BPH, cataract, glaucoma, depression, fibromyalgia, GERD, gout, hypertension, hypothyroidism, sleep apnea on CPAP, type 2 diabetes on diet who comes to the emergency department via EMS with complaints of generalized weakness, fatigue, body aches for a week prior to admission and then on the day of admission he developed a bout of coughing spells, chills and fever. Reported some dysuria and left earache. Admitted for presumed sepsis and possible pneumonia > improving.Periscrotal region cellulitis-urology consulting.  Assessment/Plan: Sepsis,  from community-acquired pneumonia  And perineal cellulitis - Initial Marked leukocytosis: 27K/neutrophilia with target cells and bandemia. -03/08/2016 CT chest mild bronchial wall thickening with patchy enhancing consolidation in the left base with air bronchograms - Previous history of pneumonia 3. Urine microscopy not suggestive of UTI. - Treated initially with IV fluids, IV vancomycin and Zosyn  - Clinically improved. Sepsis physiology resolved. Blood cultures 2: Negative to date. Lactate has normalized. - Speech therapy evaluated and recommended a regular consistency diet and thin liquids. - Outpatient follow-up with primary pulmonologist to evaluate for etiology of recurrent pneumonias.  - Patient has defervesced without fevers since admission. Leukocytosis is better (27.7 > 17.6>11>>6.8).  - Blood cultures 2: Negative to date. Urine culture negative.  scrotal, perinealcellulitiswith draining ulcer on the prepuce - Urology consultation appreciated. Discussed care with Dr. Risa Grill this am>no imminent indication for surgery and hence may continue Eliquis, not  suggestive of fungal infection and hence recommended discontinuing fluconazole and stated that urology will follow. - CT pelvis results appreciated. Diffuse scrotal edema. No soft tissue air or abscess. - After discussing with urology this morning and with pharmacy, changed antibiotics to IV ceftriaxone and continued IV vancomycin. Clindamycin, fluconazole and Lotrimin cream were discontinued. - 9/27--Discussed with Dr Risa Grill -03/12/16-->d/c vanco and ceftriaxone, start doxycycline  Oxygen dependent COPD - Stable without clinical bronchospasm. Uses oxygen 2 L/m only at bedtime along with CPAP.  OSA on nightly CPAP  Pulmonary Nodule(10 mm left upper lobe) - recommended OP follow up with CT or PET CT in 3 months and patient verbalized understanding. - Discussed with patient's primary pulmonologist Dr. Lamonte Sakai and this will be addressed during outpatient follow-up.  Essential HTN - controlled. Continue Cardizem CD and Cardura.  Hypothyroid - Continue Synthroid.  Type II DM - SSI. Reasonable inpatient control.A1c 6.3 suggesting good outpatient control.  GERD - PPI.  Paroxysmal atrial fibrillation - Currently in sinus rhythm-SB in 50's. Continue Cardizem.Apixabanheld from 9/26 PM in case surgical intervention is needed in a.m. -continue IV heparin for now in case pt needs surgery -CHADSVASc =4  Thrombocytopenia - Secondary to acute illness/sepsis pictureand antibiotics/Zosyn. Platelet counts 144 > 129 > 112>95. -improving after nadir of 95-->likely due to infection   DVT prophylaxis:IV heparin Code Status:Full Family Communication:updated wife at bedside 03/11/16--Total time spent 35 minutes.  Greater than 50% spent face to face counseling and coordinating care.  Disposition Plan:plan home on 9/29 if stable   Consultants:  Urology  Procedures:  None  Antimicrobials:  IV Vancomycin  IV Zosyn-Dc'ed  PO Fluconazole one dose 9/25> DC'ed  IV  Ceftriaxone    Subjective: Patient denies fevers, chills, headache, chest pain, dyspnea, nausea, vomiting, diarrhea, abdominal pain, dysuria, hematuria, hematochezia, and melena.    Objective: Vitals:  03/11/16 2121 03/12/16 0538 03/12/16 0900 03/12/16 1421  BP: (!) 145/65 (!) 159/69  (!) 148/65  Pulse: 64 73 76 67  Resp: 19  18 16   Temp: 97.3 F (36.3 C) 98.3 F (36.8 C)  98.6 F (37 C)  TempSrc:  Oral  Oral  SpO2: 91% 94% 95% 90%  Weight:      Height:        Intake/Output Summary (Last 24 hours) at 03/12/16 1457 Last data filed at 03/12/16 0622  Gross per 24 hour  Intake              570 ml  Output              125 ml  Net              445 ml   Weight change:  Exam:   General:  Pt is alert, follows commands appropriately, not in acute distress  HEENT: No icterus, No thrush, No neck mass, [place]/AT  Cardiovascular: RRR, S1/S2, no rubs, no gallops  Respiratory: CTA bilaterally, no wheezing, no crackles, no rhonchi  Abdomen: Soft/+BS, non tender, non distended, no guarding  Extremities: No edema, No lymphangitis, No petechiae, No rashes, no synovitis  -genital--Edema from suprapubic area to the penis. There is no purulent drainage. No necrosis. No crepitance. Minimal erythema of the scrotum.   Data Reviewed: I have personally reviewed following labs and imaging studies Basic Metabolic Panel:  Recent Labs Lab 03/07/16 2007 03/08/16 0350 03/11/16 0504 03/12/16 0552  NA 137 140 143 142  K 3.7 3.9 3.2* 3.7  CL 104 109 106 105  CO2 24 24 26 28   GLUCOSE 186* 146* 110* 117*  BUN 13 11 9 11   CREATININE 1.10 1.02 1.03 1.01  CALCIUM 9.0 7.9* 8.5* 8.9  MG  --  1.9  --  2.2  PHOS  --  3.0  --   --    Liver Function Tests:  Recent Labs Lab 03/07/16 2007 03/08/16 0350  AST 22 21  ALT 20 19  ALKPHOS 93 71  BILITOT 0.7 1.0  PROT 6.7 5.7*  ALBUMIN 4.0 3.4*   No results for input(s): LIPASE, AMYLASE in the last 168 hours. No results for input(s):  AMMONIA in the last 168 hours. Coagulation Profile: No results for input(s): INR, PROTIME in the last 168 hours. CBC:  Recent Labs Lab 03/07/16 2007 03/08/16 0350 03/09/16 0535 03/10/16 0530 03/11/16 0504 03/12/16 0552  WBC 17.4* 27.7* 17.6* 11.7* 8.0 6.8  NEUTROABS 14.1* 21.9* 12.8* 8.0*  --   --   HGB 14.5 13.1 12.9* 12.4* 11.4* 12.5*  HCT 45.3 41.0 40.6 37.9* 36.0* 38.7*  MCV 93.6 93.2 93.5 91.8 92.1 92.1  PLT 144* 129* 112* 95* 132* 152   Cardiac Enzymes: No results for input(s): CKTOTAL, CKMB, CKMBINDEX, TROPONINI in the last 168 hours. BNP: Invalid input(s): POCBNP CBG:  Recent Labs Lab 03/11/16 1213 03/11/16 1704 03/11/16 2121 03/12/16 0741 03/12/16 1204  GLUCAP 173* 123* 130* 103* 138*   HbA1C: No results for input(s): HGBA1C in the last 72 hours. Urine analysis:    Component Value Date/Time   COLORURINE YELLOW 03/07/2016 2015   APPEARANCEUR CLEAR 03/07/2016 2015   LABSPEC 1.025 03/07/2016 2015   PHURINE 6.0 03/07/2016 2015   GLUCOSEU NEGATIVE 03/07/2016 2015   HGBUR NEGATIVE 03/07/2016 2015   BILIRUBINUR NEGATIVE 03/07/2016 2015   BILIRUBINUR neg 11/23/2012 0942   KETONESUR 15 (A) 03/07/2016 2015   PROTEINUR 100 (A) 03/07/2016 2015  UROBILINOGEN 0.2 11/23/2012 0942   NITRITE NEGATIVE 03/07/2016 2015   LEUKOCYTESUR NEGATIVE 03/07/2016 2015   Sepsis Labs: @LABRCNTIP (procalcitonin:4,lacticidven:4) ) Recent Results (from the past 240 hour(s))  Blood Culture (routine x 2)     Status: None (Preliminary result)   Collection Time: 03/07/16  8:07 PM  Result Value Ref Range Status   Specimen Description BLOOD RIGHT ANTECUBITAL  Final   Special Requests BOTTLES DRAWN AEROBIC AND ANAEROBIC 5CC EA  Final   Culture NO GROWTH 4 DAYS  Final   Report Status PENDING  Incomplete  Blood Culture (routine x 2)     Status: None (Preliminary result)   Collection Time: 03/07/16  8:14 PM  Result Value Ref Range Status   Specimen Description BLOOD LEFT HAND  Final     Special Requests BOTTLES DRAWN AEROBIC AND ANAEROBIC 5CC EA  Final   Culture NO GROWTH 4 DAYS  Final   Report Status PENDING  Incomplete  Urine culture     Status: None   Collection Time: 03/07/16  8:15 PM  Result Value Ref Range Status   Specimen Description URINE, RANDOM  Final   Special Requests NONE  Final   Culture NO GROWTH  Final   Report Status 03/09/2016 FINAL  Final  Aerobic Culture (superficial specimen)     Status: None (Preliminary result)   Collection Time: 03/10/16  2:33 PM  Result Value Ref Range Status   Specimen Description PENIS  Final   Special Requests Normal  Final   Gram Stain   Final    MANY WBC PRESENT,BOTH PMN AND MONONUCLEAR MODERATE GRAM POSITIVE COCCI IN CLUSTERS IN PAIRS    Culture   Final    MODERATE METHICILLIN RESISTANT STAPHYLOCOCCUS AUREUS   Report Status PENDING  Incomplete   Organism ID, Bacteria METHICILLIN RESISTANT STAPHYLOCOCCUS AUREUS  Final      Susceptibility   Methicillin resistant staphylococcus aureus - MIC*    CIPROFLOXACIN <=0.5 SENSITIVE Sensitive     ERYTHROMYCIN >=8 RESISTANT Resistant     GENTAMICIN <=0.5 SENSITIVE Sensitive     OXACILLIN >=4 RESISTANT Resistant     TETRACYCLINE <=1 SENSITIVE Sensitive     VANCOMYCIN 1 SENSITIVE Sensitive     TRIMETH/SULFA <=10 SENSITIVE Sensitive     CLINDAMYCIN <=0.25 SENSITIVE Sensitive     RIFAMPIN <=0.5 SENSITIVE Sensitive     Inducible Clindamycin NEGATIVE Sensitive     * MODERATE METHICILLIN RESISTANT STAPHYLOCOCCUS AUREUS     Scheduled Meds: . acetaminophen  1,000 mg Oral BID  . allopurinol  300 mg Oral QPM  . brimonidine  1 drop Both Eyes BID  . calcium carbonate  0.5 tablet Oral Daily  . diltiazem  180 mg Oral Daily  . doxazosin  8 mg Oral Daily  . doxycycline  100 mg Oral Q12H  . DULoxetine  30 mg Oral Daily  . fluticasone  2 spray Each Nare Daily  . insulin aspart  0-9 Units Subcutaneous TID WC  . latanoprost  1 drop Both Eyes QHS  . levothyroxine  75 mcg Oral QAC  breakfast  . loratadine  10 mg Oral Daily  . magnesium oxide  400 mg Oral Daily  . Melatonin  4.5 mg Oral QHS  . mometasone-formoterol  2 puff Inhalation BID  . multivitamin with minerals  1 tablet Oral Daily  . pantoprazole  40 mg Oral Daily  . pregabalin  100 mg Oral QHS  . sodium chloride flush  3 mL Intravenous Q12H   Continuous Infusions: .  heparin 1,250 Units/hr (03/12/16 1058)    Procedures/Studies: Dg Chest 2 View  Result Date: 03/07/2016 CLINICAL DATA:  Generalized weakness. History of atrial fibrillation. EXAM: CHEST  2 VIEW COMPARISON:  June 25, 2015 FINDINGS: No pneumothorax. The lateral view is markedly limited due to positioning. Possible opacity in the left lung base. The heart size is normal. The mediastinum is prominent compared to the previous study. Whether this is a true finding or due to positioning is unclear. If there is concern, a CT scan for better position chest x-ray could better evaluate. The right lung is clear. Mild atelectasis in the right base. No other acute abnormalities. IMPRESSION: The study is limited due to positioning, particularly the lateral view. Opacity is not excluded in the left base. The mediastinum is more prominent in the interval. Whether this is a true finding or due to positioning is unclear. Recommend either a better position chest x-ray when the patient is able or a CT scan for better evaluation. Electronically Signed   By: Dorise Bullion III M.D   On: 03/07/2016 20:55   Ct Chest W Contrast  Result Date: 03/08/2016 CLINICAL DATA:  Fatigue for month, hemoptysis. Assess for pneumonia. History atrial fibrillation, bronchitis, hypertension. EXAM: CT CHEST WITH CONTRAST TECHNIQUE: Multidetector CT imaging of the chest was performed during intravenous contrast administration. CONTRAST:  17mL ISOVUE-300 IOPAMIDOL (ISOVUE-300) INJECTION 61% COMPARISON:  Chest radiograph March 07, 2016 at 2034 hours FINDINGS: CARDIOVASCULAR: Heart size is  mildly enlarged. Mild coronary artery calcifications. Small amount of low-density free fluid in the central sulcus. No pericardial effusions. Thoracic aorta is normal in course and caliber with moderate calcific atherosclerosis in intimal thickening. MEDIASTINUM/NODES: No mediastinal mass. Prominent mediastinal fat. No lymphadenopathy by CT size criteria. Normal appearance of thoracic esophagus though not tailored for evaluation. LUNGS/PLEURA: Tracheobronchial tree is patent, no pneumothorax. Mild bronchial wall thickening. 2 mm calcified granuloma RIGHT middle lobe. Bibasilar dependent atelectasis and trace pleural effusions. Patchy enhancing consolidation LEFT lung base with air bronchograms. Irregular 10 x 7 mm LEFT upper lobe, apical posterior segment, axial 32/145. Minimal bibasilar calcified pleural plaques. UPPER ABDOMEN: Status post cholecystectomy. Diffusely hypodense liver compatible with steatosis. Small hiatal hernia. MUSCULOSKELETAL: Included view of the neck demonstrates medially deviated RIGHT crack or arachnoid cartilage and cord, query vocal cord paralysis. Suture anchors RIGHT humeral head. Sub cm sclerotic focus LEFT posterior eighth rib, likely bone island. Old RIGHT anterior rib fractures. No acute osseous process . Old moderate T9 compression fracture. Severe degenerative change of the included cervical spine. IMPRESSION: Bronchial wall thickening can be seen with bronchitis or reactive airway disease. Bibasilar atelectasis, less likely pneumonia. Minimal bibasilar calcified pleural plaques and trace pleural effusions. **An incidental finding of potential clinical significance has been found. 10 mm LEFT upper lobe pulmonary nodule ; recommend follow-up CT or PET- CT at 3 months.** Electronically Signed   By: Elon Alas M.D.   On: 03/08/2016 01:25   Ct Pelvis W Contrast  Result Date: 03/10/2016 CLINICAL DATA:  Acute onset of groin swelling. Assess for cellulitis or Fournier's  gangrene. Initial encounter. EXAM: CT PELVIS WITH CONTRAST TECHNIQUE: Multidetector CT imaging of the pelvis was performed using the standard protocol following the bolus administration of intravenous contrast. CONTRAST:  147mL ISOVUE-300 IOPAMIDOL (ISOVUE-300) INJECTION 61% COMPARISON:  None. FINDINGS: Urinary Tract: The bladder is mildly distended and grossly unremarkable in appearance. Bowel: Scattered diverticulosis is noted along the distal descending and proximal sigmoid colon, without evidence of diverticulitis. The patient is status post  appendectomy. Vascular/Lymphatic: Scattered calcification is noted at the distal abdominal aorta and its branches. No pelvic sidewall lymphadenopathy is seen. No inguinal lymphadenopathy is identified. Reproductive: The prostate is enlarged, measuring 5.3 cm in transverse dimension, with mild heterogeneity. Other: Diffuse scrotal edema is noted. There appears to be edema about the right epididymis, with question of asymmetric prominence of the right testis, which may reflect right-sided epididymitis. Would correlate for any clinical symptoms to suggest orchitis. Soft tissue edema tracks along the penile shaft and also superiorly along the anterior lower pelvic wall. No soft tissue air is seen to suggest Fournier's gangrene. There is no evidence of abscess. Musculoskeletal: No acute osseous abnormalities are identified. Facet disease is noted at the lower lumbar spine, with vacuum phenomenon at L4-L5. The visualized musculature is grossly unremarkable in appearance. IMPRESSION: 1. Diffuse scrotal edema noted. Soft tissue edema tracks along the penile shaft and superiorly along the anterior lower pelvic wall. No soft tissue air seen. No evidence of abscess. 2. Edema noted about the right epididymis, with question of asymmetric prominence of the right testis. This may reflect right-sided epididymitis. Would correlate for any clinical symptoms to suggest orchitis. 3. Scattered  diverticulosis along the distal descending and proximal sigmoid colon, without evidence of diverticulitis. 4. Scattered aortic atherosclerosis. 5. Enlarged prostate noted, with mild heterogeneity. Would correlate with PSA. Electronically Signed   By: Garald Balding M.D.   On: 03/10/2016 04:08   Mr Brain Wo Contrast  Result Date: 03/06/2016 [facility] NEUROIMAG[facility] REPORT STUDY DATE: 03/05/16 PATIENT NAME: Barnell Mane DOB: 06-27-1932 MRN: [id number] ORDERING CLINICIAN: Kathrynn Ducking, MD CLINICAL HISTORY: 82 year old male with transient memory loss. EXAM: MRI brain (without) TECHNIQUE: MRI of the brain without contrast was obtained utilizing 5 mm axial slices with T1, T2, T2 flair, SWI and diffusion weighted views.  T1 sagittal and T2 coronal views were obtained. CONTRAST: no IMAGING SITE: [facility] [address] (3 Tesla MRI)  FINDINGS: No abnormal lesions are seen on diffusion-weighted views to suggest acute ischemia. The cortical sulci, fissures and cisterns are notable for mild generalized atrophy. Lateral, third and fourth ventricle are normal in size and appearance. No extra-axial fluid collections are seen. No evidence of mass effect or midline shift.  Minimal hazy periventricular and subcortical T2 hyperintensities. On sagittal views the posterior fossa, pituitary gland and corpus callosum are unremarkable. No evidence of intracranial hemorrhage on SWI views. The orbits and their contents, paranasal sinuses and calvarium are notable for bilateral lens extractions.  Intracranial flow voids are present.   Mildly abnormal MRI brain (without) demonstrating: 1. Mild generalized atrophy. 2. Mild periventricular and subcortical small vessel ischemic disease. 3. No acute findings. INTERPRETING PHYSICIAN: Penni Bombard, MD Certified in Neurology, Neurophysiology and Neuroimaging [facility] [address], [place], [place] [address] [phone]   Dg Chest Port 1 View  Result Date: 03/08/2016 CLINICAL DATA:  Sepsis. EXAM: PORTABLE CHEST 1 VIEW COMPARISON:  03/07/2016 FINDINGS: Shallow inspiration. Mild cardiac enlargement. Peribronchial thickening with central interstitial changes consistent with chronic bronchitis. Probable atelectasis in the left lung base. No focal airspace disease or consolidation in the lungs. No blunting of costophrenic angles. No pneumothorax. Calcified and tortuous aorta. IMPRESSION: Shallow inspiration with atelectasis in the left lung base. Chronic bronchitic changes. No evidence of active pulmonary disease. Electronically Signed   By: Lucienne Capers M.D.   On: 03/08/2016 04:08    Raeshawn Vo, DO  [facility] Pager [phone]  If 7PM-7AM, please contact night-coverage [email or web address] Password [email or web address] 03/12/2016, 2:57 PM   LOS: 5 days

## 2016-03-12 NOTE — Progress Notes (Signed)
ANTICOAGULATION CONSULT NOTE - Follow Up Consult  Pharmacy Consult for heparin Indication: atrial fibrillation (while Apixaban on hold)  Labs:  Recent Labs  03/10/16 0530 03/11/16 0504 03/11/16 2004 03/12/16 0552 03/12/16 1251  HGB 12.4* 11.4*  --  12.5*  --   HCT 37.9* 36.0*  --  38.7*  --   PLT 95* 132*  --  152  --   APTT  --   --  35 93* 69*  HEPARINUNFRC  --   --  0.69 0.91*  --   CREATININE  --  1.03  --  1.01  --     Assessment/Plan:  82yo male therapeutic on heparin with initial dosing while Apixaban on hold. Will continue gtt at current rate.  Will follow-up daily aPTT, heparin level, and CBC.  Follow-up for Apixaban restart as well.   [employer], Pharm.D., BCPS Clinical Pharmacist Pager [phone] 03/12/2016 2:16 PM

## 2016-03-13 DIAGNOSIS — I4891 Unspecified atrial fibrillation: Secondary | ICD-10-CM

## 2016-03-13 DIAGNOSIS — E038 Other specified hypothyroidism: Secondary | ICD-10-CM

## 2016-03-13 LAB — CBC
HCT: 35.7 % — ABNORMAL LOW (ref 39.0–52.0)
Hemoglobin: 11.2 g/dL — ABNORMAL LOW (ref 13.0–17.0)
MCH: 29.2 pg (ref 26.0–34.0)
MCHC: 31.4 g/dL (ref 30.0–36.0)
MCV: 93 fL (ref 78.0–100.0)
Platelets: 158 10*3/uL (ref 150–400)
RBC: 3.84 MIL/uL — ABNORMAL LOW (ref 4.22–5.81)
RDW: 14 % (ref 11.5–15.5)
WBC: 5.9 10*3/uL (ref 4.0–10.5)

## 2016-03-13 LAB — AEROBIC CULTURE  (SUPERFICIAL SPECIMEN): Special Requests: NORMAL

## 2016-03-13 LAB — AEROBIC CULTURE W GRAM STAIN (SUPERFICIAL SPECIMEN)

## 2016-03-13 LAB — HEPARIN LEVEL (UNFRACTIONATED): Heparin Unfractionated: 0.56 IU/mL (ref 0.30–0.70)

## 2016-03-13 LAB — GLUCOSE, CAPILLARY: Glucose-Capillary: 115 mg/dL — ABNORMAL HIGH (ref 65–99)

## 2016-03-13 MED ORDER — DOXYCYCLINE HYCLATE 100 MG PO TABS: 100.0000 mg | ORAL_TABLET | Freq: Two times a day (BID) | ORAL | 0 refills | Status: DC

## 2016-03-13 MED ORDER — APIXABAN 5 MG PO TABS
5.0000 mg | ORAL_TABLET | Freq: Two times a day (BID) | ORAL | Status: DC
  Administered 2016-03-13: 5 mg via ORAL
  Filled 2016-03-13: qty 1

## 2016-03-13 NOTE — Progress Notes (Signed)
Pt given discharge instructions, prescriptions, and care notes. Pt verbalized understanding AEB no further questions or concerns at this time. IV was discontinued, no redness, pain, or swelling noted at this time. Pt left the floor via wheelchair with staff in stable condition. 

## 2016-03-13 NOTE — Consult Note (Signed)
Final Urology Consult Note   Requesting Attending Physician:  Orson Eva, MD Service Providing Consult: Urology Consulting Attending: Risa Grill, MD  Assessment:   82 y.o. male with extensive medical history, significant for BPH on cardura, A fib on Eliquis, COPD with chronic bronchitis & PNA, HTN, OSA, type II DM (HbA1c 6.5) admitted for presumed sepsis 2/2 PNA. Urology consulted for erythematous perineal process and penile lesion with penile edema. Urine & blood cultures NGTD. Cr stable. Physical exam appears consistent with scrotal, penile, perineal cellulitis with draining lesion at the 3 oclock position of the prepucial collar of the uncircumcised penis. Patient is spontaneously voiding. There are no areas of fluctuance or crepitus suggestive of abscess or Fournier's gangrene. May have element of bilateral epididymoorchitis as well. Culture swab of penile lesion suggests small penile MRSA abscess with surrounding cellulitis.  Interval: AFVSS, spontaneous voiding. WBC to 5.9. Transitioned to PO doxy. Preliminary culture swab showing MRSA with sensitivities.   Recommendations: 1. Agree with doxy PO course, follow up culture swab final results and sensitivities from 9/26 2. No urology procedural intervention required. OK to resume eliquis and discharge 3. Encourage warm compresses/towels, warm shower to allow spontaneous drainage of penile lesion 4. We will facilitate close follow up with urology for repeat examination and review BPH meds/symptoms  Thank you for this consult. Please contact the urology consult pager with any further questions/concerns. We will sign off.  [name], MD Urology Surgical Resident    Subjective: Doing well. Ready to go home. No nausea, pain. Discharging today per primary team.   Objective   Vital signs in last 24 hours: BP (!) 152/83 (BP Location: Left Arm)   Pulse 72   Temp 97.8 F (36.6 C) (Oral)   Resp 18   Ht 5\' 7"  (1.702 m)   Wt 216 lb (98 kg)    SpO2 97%   BMI 33.83 kg/m   Intake/Output last 3 shifts: I/O last 3 completed shifts: In: 1216.6 [P.O.:680; I.V.:384.6; Other:2; IV Piggyback:150] Out: 325 [Urine:325]  Physical Exam General: NAD, A&O, resting, appropriate, appears younger than stated age 89: [place]/AT, EOMI, MMM Pulmonary: Normal work of breathing on RA Cardiovascular: Regular rate & rhythm, HDS, adequate peripheral perfusion Abdomen: soft, NTTP, nondistended, no suprapubic fullness or tenderness, protuberant GU: voiding spontaneously, erythema and cellulitis changes to scrotum improving, penis and suprapubic fat pad erythema significantly improved, moderate to significant penile edema with inability to retract foreskin or visualize glans/urethral meatus, no crepitus noted, small area of draining purulent fluid at 3 o'clock of the prepuce which is mildly TTP and is draining less than yesterday and now more thin & serous - some laxity of the underlying skin at this site likely secondary to fluid drained, no significant TTP on testicular exam with some minor induration of the right spermatic cord  Extremities: warm and well perfused, no edema Neuro: Appropriate, no focal neurological deficits  Most Recent Labs: Lab Results  Component Value Date   WBC 5.9 03/13/2016   HGB 11.2 (L) 03/13/2016   HCT 35.7 (L) 03/13/2016   PLT 158 03/13/2016    Lab Results  Component Value Date   NA 142 03/12/2016   K 3.7 03/12/2016   CL 105 03/12/2016   CO2 28 03/12/2016   BUN 11 03/12/2016   CREATININE 1.01 03/12/2016   CALCIUM 8.9 03/12/2016   MG 2.2 03/12/2016   PHOS 3.0 03/08/2016    Lab Results  Component Value Date   ALKPHOS 71 03/08/2016   BILITOT 1.0  03/08/2016   PROT 5.7 (L) 03/08/2016   ALBUMIN 3.4 (L) 03/08/2016   ALT 19 03/08/2016   AST 21 03/08/2016    Lab Results  Component Value Date   INR 1.00 07/20/2011   APTT 69 (H) 03/12/2016     Urine Culture: Negative   IMAGING: CT pelvis personally  reviewed.

## 2016-03-13 NOTE — Discharge Summary (Addendum)
Physician Discharge Summary  Jonathan [name] [id number] DOB: 1932-10-10 DOA: 03/07/2016  PCP: Thressa Sheller, MD  Admit date: 03/07/2016 Discharge date: 03/13/2016  Admitted From: home Disposition:  home  Recommendations for Outpatient Follow-up:  1. Follow up with PCP in 1-2 weeks 2. Please obtain BMP/CBC in one week  Home Health: no Equipment/Devices: No  Discharge Condition:Stable CODE STATUS: FULL Diet recommendation: Heart Healthy  Brief/Interim Summary: 82 y.o.malewith medical history significant of COPD on home oxygen (2 L /min only at bedtime), pneumonia 3, paroxysmal atrial fibrillation on Eliquis, BPH, cataract, glaucoma, depression, fibromyalgia, GERD, gout, hypertension, hypothyroidism, sleep apnea on CPAP, type 2 diabetes on diet who comes to the emergency department via EMS with complaints of generalized weakness, fatigue, body aches for a week prior to admission and then on the day of admission he developed a bout of coughing spells, chills and fever. Reported some dysuria and left earache. Admitted for presumed sepsis and possible pneumonia > improving.Periscrotal region cellulitis-urology consulting.  Discharge Diagnoses:  Sepsis, from community-acquired pneumonia and perineal cellulitis - Initial Marked leukocytosis: 27K/neutrophilia with target cells and bandemia. -03/08/2016 CT chest mild bronchial wall thickening with patchy enhancing consolidation in the left base with air bronchograms - Previous history of pneumonia 3. Urine microscopy not suggestive of UTI. - Treated initially with IV fluids, IV vancomycin and Zosyn  - Clinically improved. Sepsis physiology resolved. Blood cultures 2: Negative to date. Lactate has normalized. - Speech therapy evaluated and recommended a regular consistency diet and thin liquids. - Outpatient follow-up with primary pulmonologist to evaluate for etiology of recurrent pneumonias.  - Patient has defervesced without  fevers since admission. Leukocytosis is better (27.7 > 17.6>11>>6.8).  - Blood cultures 2: Negative to date. Urine culture negative.  scrotal, perinealcellulitiswith draining ulcer on the prepuce - Urology consultation appreciated. Discussed care with Dr. Risa Grill this am>no imminent indication for surgery and hence may continue Eliquis, not suggestive of fungal infection and hence recommended discontinuing fluconazole and stated that urology will follow. - CT pelvis results appreciated. Diffuse scrotal edema. No soft tissue air or abscess. - After discussing with urology 9/26 and with pharmacy, changed antibiotics to IV ceftriaxone and continued IV vancomycin. Clindamycin, fluconazole and Lotrimin cream were discontinued. -abscess culture = MRSA - 9/27--Discussed with Dr Risa Grill -03/12/16-->d/c vanco and ceftriaxone, start doxycycline -stable on doxycycline, home with 8 more days of abx to complete 2 week of therapy  Oxygen dependent COPD - Stable without clinical bronchospasm. Uses oxygen 2 L/m only at bedtime along with CPAP.  OSA on nightly CPAP  Pulmonary Nodule(10 mm left upper lobe) - recommended OP follow up with CT or PET CT in 3 months and patient verbalized understanding. - Discussed with patient's primary pulmonologist Dr. Lamonte Sakai and this will be addressed during outpatient follow-up.  Essential HTN - Continue Cardizem CD and Cardura. -BP acceptable  Hypothyroid - Continue Synthroid.  Type II DM - SSI. Reasonable inpatient control.A1c 6.3 suggesting good outpatient control.  GERD - PPI.   Paroxysmal atrial fibrillation - Currently in sinus rhythm-SB in 50's. Continue Cardizem. -apixaban initially held due to possible need for surgery and pt placed on IV heparin -home with apixaban -rate controlled -CHADSVASc =4  Thrombocytopenia -Secondary to acute illness/sepsis pictureand antibiotics/Zosyn. Platelet counts 144 >129 >112>95. -improving after  nadir of 95-->likely due to infection -platelets 158 on day of d/c   Discharge Instructions  Discharge Instructions    Diet - low sodium heart healthy    Complete by:  As directed  Increase activity slowly    Complete by:  As directed        Medication List    TAKE these medications   acetaminophen 500 MG tablet Commonly known as:  TYLENOL Take 2 tablets (1,000 mg total) by mouth 2 (two) times daily.   alendronate 70 MG tablet Commonly known as:  FOSAMAX Take 70 mg by mouth every Monday.   allopurinol 300 MG tablet Commonly known as:  ZYLOPRIM Take 300 mg by mouth every evening.   brimonidine 0.15 % ophthalmic solution Commonly known as:  ALPHAGAN Place 1 drop into both eyes 2 (two) times daily.   Calcium Lactate 750 MG Tabs Take 1 tablet by mouth daily.   COLCRYS 0.6 MG tablet Generic drug:  colchicine Take 0.6 mg by mouth daily as needed (flareups).   diltiazem 180 MG 24 hr capsule Commonly known as:  CARDIZEM CD Take 1 capsule (180 mg total) by mouth daily.   doxazosin 8 MG tablet Commonly known as:  CARDURA Take 1 tablet (8 mg total) by mouth daily. PATIENT NEEDS OFFICE VISIT FOR ADDITIONAL REFILLS   doxycycline 100 MG tablet Commonly known as:  VIBRA-TABS Take 1 tablet (100 mg total) by mouth every 12 (twelve) hours.   DULERA 200-5 MCG/ACT Aero Generic drug:  mometasone-formoterol INHALE 2 PUFFS INTO THE LUNGS 2 (TWO) TIMES DAILY.   DULoxetine 30 MG capsule Commonly known as:  CYMBALTA Take 30 mg by mouth daily.   ELIQUIS 5 MG Tabs tablet Generic drug:  apixaban TAKE 1 TABLET (5 MG TOTAL) BY MOUTH 2 (TWO) TIMES DAILY.   EQL POTASSIUM GLUCONATE 595 (99 K) MG Tabs tablet Generic drug:  potassium gluconate Take 595 mg by mouth daily.   fluticasone 50 MCG/ACT nasal spray Commonly known as:  FLONASE Place 2 sprays into the nose 2 (two) times daily as needed for allergies.   latanoprost 0.005 % ophthalmic solution Commonly known as:   XALATAN Place 1 drop into both eyes at bedtime.   levalbuterol 45 MCG/ACT inhaler Commonly known as:  XOPENEX HFA Inhale 1-2 puffs into the lungs every 4 (four) hours as needed for wheezing.   levothyroxine 75 MCG tablet Commonly known as:  SYNTHROID, LEVOTHROID Take 1 tablet (75 mcg total) by mouth daily before breakfast.   loratadine 10 MG tablet Commonly known as:  CLARITIN Take 10 mg by mouth daily.   magnesium oxide 400 MG tablet Commonly known as:  MAG-OX Take 400 mg by mouth daily.   Melatonin 5 MG Tabs Take 5 mg by mouth at bedtime.   mometasone 50 MCG/ACT nasal spray Commonly known as:  NASONEX Place 2 sprays into the nose daily.   multivitamin tablet Take 1 tablet by mouth daily.   nitroGLYCERIN 0.4 MG SL tablet Commonly known as:  NITROSTAT Place 1 tablet (0.4 mg total) under the tongue every 5 (five) minutes as needed for chest pain.   NON FORMULARY Inhale 2 L into the lungs at bedtime. CPAP with O2 2L   omeprazole 20 MG capsule Commonly known as:  PRILOSEC Take 20 mg by mouth daily.   pregabalin 100 MG capsule Commonly known as:  LYRICA Take 100 mg by mouth at bedtime.   tadalafil 20 MG tablet Commonly known as:  CIALIS Take 20 mg by mouth daily as needed. For erectile dysfunction   zaleplon 10 MG capsule Commonly known as:  SONATA Take 10 mg by mouth at bedtime.       No Known Allergies  Consultations:  Urology   Procedures/Studies: Dg Chest 2 View  Result Date: 03/07/2016 CLINICAL DATA:  Generalized weakness. History of atrial fibrillation. EXAM: CHEST  2 VIEW COMPARISON:  June 25, 2015 FINDINGS: No pneumothorax. The lateral view is markedly limited due to positioning. Possible opacity in the left lung base. The heart size is normal. The mediastinum is prominent compared to the previous study. Whether this is a true finding or due to positioning is unclear. If there is concern, a CT scan for better position chest x-ray could better  evaluate. The right lung is clear. Mild atelectasis in the right base. No other acute abnormalities. IMPRESSION: The study is limited due to positioning, particularly the lateral view. Opacity is not excluded in the left base. The mediastinum is more prominent in the interval. Whether this is a true finding or due to positioning is unclear. Recommend either a better position chest x-ray when the patient is able or a CT scan for better evaluation. Electronically Signed   By: Dorise Bullion III M.D   On: 03/07/2016 20:55   Ct Chest W Contrast  Result Date: 03/08/2016 CLINICAL DATA:  Fatigue for month, hemoptysis. Assess for pneumonia. History atrial fibrillation, bronchitis, hypertension. EXAM: CT CHEST WITH CONTRAST TECHNIQUE: Multidetector CT imaging of the chest was performed during intravenous contrast administration. CONTRAST:  53mL ISOVUE-300 IOPAMIDOL (ISOVUE-300) INJECTION 61% COMPARISON:  Chest radiograph March 07, 2016 at 2034 hours FINDINGS: CARDIOVASCULAR: Heart size is mildly enlarged. Mild coronary artery calcifications. Small amount of low-density free fluid in the central sulcus. No pericardial effusions. Thoracic aorta is normal in course and caliber with moderate calcific atherosclerosis in intimal thickening. MEDIASTINUM/NODES: No mediastinal mass. Prominent mediastinal fat. No lymphadenopathy by CT size criteria. Normal appearance of thoracic esophagus though not tailored for evaluation. LUNGS/PLEURA: Tracheobronchial tree is patent, no pneumothorax. Mild bronchial wall thickening. 2 mm calcified granuloma RIGHT middle lobe. Bibasilar dependent atelectasis and trace pleural effusions. Patchy enhancing consolidation LEFT lung base with air bronchograms. Irregular 10 x 7 mm LEFT upper lobe, apical posterior segment, axial 32/145. Minimal bibasilar calcified pleural plaques. UPPER ABDOMEN: Status post cholecystectomy. Diffusely hypodense liver compatible with steatosis. Small hiatal hernia.  MUSCULOSKELETAL: Included view of the neck demonstrates medially deviated RIGHT crack or arachnoid cartilage and cord, query vocal cord paralysis. Suture anchors RIGHT humeral head. Sub cm sclerotic focus LEFT posterior eighth rib, likely bone island. Old RIGHT anterior rib fractures. No acute osseous process . Old moderate T9 compression fracture. Severe degenerative change of the included cervical spine. IMPRESSION: Bronchial wall thickening can be seen with bronchitis or reactive airway disease. Bibasilar atelectasis, less likely pneumonia. Minimal bibasilar calcified pleural plaques and trace pleural effusions. **An incidental finding of potential clinical significance has been found. 10 mm LEFT upper lobe pulmonary nodule ; recommend follow-up CT or PET- CT at 3 months.** Electronically Signed   By: Elon Alas M.D.   On: 03/08/2016 01:25   Ct Pelvis W Contrast  Result Date: 03/10/2016 CLINICAL DATA:  Acute onset of groin swelling. Assess for cellulitis or Fournier's gangrene. Initial encounter. EXAM: CT PELVIS WITH CONTRAST TECHNIQUE: Multidetector CT imaging of the pelvis was performed using the standard protocol following the bolus administration of intravenous contrast. CONTRAST:  14mL ISOVUE-300 IOPAMIDOL (ISOVUE-300) INJECTION 61% COMPARISON:  None. FINDINGS: Urinary Tract: The bladder is mildly distended and grossly unremarkable in appearance. Bowel: Scattered diverticulosis is noted along the distal descending and proximal sigmoid colon, without evidence of diverticulitis. The patient is status post appendectomy. Vascular/Lymphatic: Scattered calcification is  noted at the distal abdominal aorta and its branches. No pelvic sidewall lymphadenopathy is seen. No inguinal lymphadenopathy is identified. Reproductive: The prostate is enlarged, measuring 5.3 cm in transverse dimension, with mild heterogeneity. Other: Diffuse scrotal edema is noted. There appears to be edema about the right  epididymis, with question of asymmetric prominence of the right testis, which may reflect right-sided epididymitis. Would correlate for any clinical symptoms to suggest orchitis. Soft tissue edema tracks along the penile shaft and also superiorly along the anterior lower pelvic wall. No soft tissue air is seen to suggest Fournier's gangrene. There is no evidence of abscess. Musculoskeletal: No acute osseous abnormalities are identified. Facet disease is noted at the lower lumbar spine, with vacuum phenomenon at L4-L5. The visualized musculature is grossly unremarkable in appearance. IMPRESSION: 1. Diffuse scrotal edema noted. Soft tissue edema tracks along the penile shaft and superiorly along the anterior lower pelvic wall. No soft tissue air seen. No evidence of abscess. 2. Edema noted about the right epididymis, with question of asymmetric prominence of the right testis. This may reflect right-sided epididymitis. Would correlate for any clinical symptoms to suggest orchitis. 3. Scattered diverticulosis along the distal descending and proximal sigmoid colon, without evidence of diverticulitis. 4. Scattered aortic atherosclerosis. 5. Enlarged prostate noted, with mild heterogeneity. Would correlate with PSA. Electronically Signed   By: Garald Balding M.D.   On: 03/10/2016 04:08   Mr Brain Wo Contrast  Result Date: 03/06/2016 [facility] NEURO[facility] REPORT STUDY DATE: 03/05/16 PATIENT NAME: Kimoni Chaban DOB: Nov 08, 1932 MRN: [id number] ORDERING CLINICIAN: Kathrynn Ducking, MD CLINICAL HISTORY: 82 year old male with transient memory loss. EXAM: MRI brain (without) TECHNIQUE: MRI of the brain without contrast was obtained utilizing 5 mm axial slices with T1, T2, T2 flair, SWI and diffusion weighted views.  T1 sagittal and T2 coronal views were obtained. CONTRAST: no IMAGING SITE: [facility] [address] (3 Tesla MRI)  FINDINGS: No abnormal lesions are seen on diffusion-weighted  views to suggest acute ischemia. The cortical sulci, fissures and cisterns are notable for mild generalized atrophy. Lateral, third and fourth ventricle are normal in size and appearance. No extra-axial fluid collections are seen. No evidence of mass effect or midline shift.  Minimal hazy periventricular and subcortical T2 hyperintensities. On sagittal views the posterior fossa, pituitary gland and corpus callosum are unremarkable. No evidence of intracranial hemorrhage on SWI views. The orbits and their contents, paranasal sinuses and calvarium are notable for bilateral lens extractions.  Intracranial flow voids are present.   Mildly abnormal MRI brain (without) demonstrating: 1. Mild generalized atrophy. 2. Mild periventricular and subcortical small vessel ischemic disease. 3. No acute findings. INTERPRETING PHYSICIAN: Penni Bombard, MD Certified in Neurology, Neurophysiology and Neuroimaging [facility] [address] [place], [place] [address] [phone]   Dg Chest Port 1 View  Result Date: 03/08/2016 CLINICAL DATA:  Sepsis. EXAM: PORTABLE CHEST 1 VIEW COMPARISON:  03/07/2016 FINDINGS: Shallow inspiration. Mild cardiac enlargement. Peribronchial thickening with central interstitial changes consistent with chronic bronchitis. Probable atelectasis in the left lung base. No focal airspace disease or consolidation in the lungs. No blunting of costophrenic angles. No pneumothorax. Calcified and tortuous aorta. IMPRESSION: Shallow inspiration with atelectasis in the left lung base. Chronic bronchitic changes. No evidence of active pulmonary disease. Electronically Signed   By: Lucienne Capers M.D.   On: 03/08/2016 04:08         Discharge Exam: Vitals:   03/13/16 [id number] 03/13/16 [id number]  BP: (!) 152/83   Pulse: 67 72  Resp: 18 18  Temp: 97.8 F (36.6 C)    Vitals:   03/12/16 2124 03/12/16 2124 03/13/16 0455 03/13/16 0851  BP:  (!) 158/95 (!) 152/83   Pulse:  75 67  72  Resp:  18 18 18   Temp:  98.5 F (36.9 C) 97.8 F (36.6 C)   TempSrc:  Oral Oral   SpO2: 95% 97%    Weight:      Height:        General: Pt is alert, awake, not in acute distress Cardiovascular: RRR, S1/S2 +, no rubs, no gallops Respiratory: CTA bilaterally, no wheezing, no rhonchi Abdominal: Soft, NT, ND, bowel sounds + Extremities: no edema, no cyanosis   The results of significant diagnostics from this hospitalization (including imaging, microbiology, ancillary and laboratory) are listed below for reference.    Significant Diagnostic Studies: Dg Chest 2 View  Result Date: 03/07/2016 CLINICAL DATA:  Generalized weakness. History of atrial fibrillation. EXAM: CHEST  2 VIEW COMPARISON:  June 25, 2015 FINDINGS: No pneumothorax. The lateral view is markedly limited due to positioning. Possible opacity in the left lung base. The heart size is normal. The mediastinum is prominent compared to the previous study. Whether this is a true finding or due to positioning is unclear. If there is concern, a CT scan for better position chest x-ray could better evaluate. The right lung is clear. Mild atelectasis in the right base. No other acute abnormalities. IMPRESSION: The study is limited due to positioning, particularly the lateral view. Opacity is not excluded in the left base. The mediastinum is more prominent in the interval. Whether this is a true finding or due to positioning is unclear. Recommend either a better position chest x-ray when the patient is able or a CT scan for better evaluation. Electronically Signed   By: Dorise Bullion III M.D   On: 03/07/2016 20:55   Ct Chest W Contrast  Result Date: 03/08/2016 CLINICAL DATA:  Fatigue for month, hemoptysis. Assess for pneumonia. History atrial fibrillation, bronchitis, hypertension. EXAM: CT CHEST WITH CONTRAST TECHNIQUE: Multidetector CT imaging of the chest was performed during intravenous contrast administration. CONTRAST:  32mL  ISOVUE-300 IOPAMIDOL (ISOVUE-300) INJECTION 61% COMPARISON:  Chest radiograph March 07, 2016 at 2034 hours FINDINGS: CARDIOVASCULAR: Heart size is mildly enlarged. Mild coronary artery calcifications. Small amount of low-density free fluid in the central sulcus. No pericardial effusions. Thoracic aorta is normal in course and caliber with moderate calcific atherosclerosis in intimal thickening. MEDIASTINUM/NODES: No mediastinal mass. Prominent mediastinal fat. No lymphadenopathy by CT size criteria. Normal appearance of thoracic esophagus though not tailored for evaluation. LUNGS/PLEURA: Tracheobronchial tree is patent, no pneumothorax. Mild bronchial wall thickening. 2 mm calcified granuloma RIGHT middle lobe. Bibasilar dependent atelectasis and trace pleural effusions. Patchy enhancing consolidation LEFT lung base with air bronchograms. Irregular 10 x 7 mm LEFT upper lobe, apical posterior segment, axial 32/145. Minimal bibasilar calcified pleural plaques. UPPER ABDOMEN: Status post cholecystectomy. Diffusely hypodense liver compatible with steatosis. Small hiatal hernia. MUSCULOSKELETAL: Included view of the neck demonstrates medially deviated RIGHT crack or arachnoid cartilage and cord, query vocal cord paralysis. Suture anchors RIGHT humeral head. Sub cm sclerotic focus LEFT posterior eighth rib, likely bone island. Old RIGHT anterior rib fractures. No acute osseous process . Old moderate T9 compression fracture. Severe degenerative change of the included cervical spine. IMPRESSION: Bronchial wall thickening can be seen with bronchitis or reactive airway disease. Bibasilar atelectasis, less likely pneumonia. Minimal bibasilar  calcified pleural plaques and trace pleural effusions. **An incidental finding of potential clinical significance has been found. 10 mm LEFT upper lobe pulmonary nodule ; recommend follow-up CT or PET- CT at 3 months.** Electronically Signed   By: Elon Alas M.D.   On:  03/08/2016 01:25   Ct Pelvis W Contrast  Result Date: 03/10/2016 CLINICAL DATA:  Acute onset of groin swelling. Assess for cellulitis or Fournier's gangrene. Initial encounter. EXAM: CT PELVIS WITH CONTRAST TECHNIQUE: Multidetector CT imaging of the pelvis was performed using the standard protocol following the bolus administration of intravenous contrast. CONTRAST:  174mL ISOVUE-300 IOPAMIDOL (ISOVUE-300) INJECTION 61% COMPARISON:  None. FINDINGS: Urinary Tract: The bladder is mildly distended and grossly unremarkable in appearance. Bowel: Scattered diverticulosis is noted along the distal descending and proximal sigmoid colon, without evidence of diverticulitis. The patient is status post appendectomy. Vascular/Lymphatic: Scattered calcification is noted at the distal abdominal aorta and its branches. No pelvic sidewall lymphadenopathy is seen. No inguinal lymphadenopathy is identified. Reproductive: The prostate is enlarged, measuring 5.3 cm in transverse dimension, with mild heterogeneity. Other: Diffuse scrotal edema is noted. There appears to be edema about the right epididymis, with question of asymmetric prominence of the right testis, which may reflect right-sided epididymitis. Would correlate for any clinical symptoms to suggest orchitis. Soft tissue edema tracks along the penile shaft and also superiorly along the anterior lower pelvic wall. No soft tissue air is seen to suggest Fournier's gangrene. There is no evidence of abscess. Musculoskeletal: No acute osseous abnormalities are identified. Facet disease is noted at the lower lumbar spine, with vacuum phenomenon at L4-L5. The visualized musculature is grossly unremarkable in appearance. IMPRESSION: 1. Diffuse scrotal edema noted. Soft tissue edema tracks along the penile shaft and superiorly along the anterior lower pelvic wall. No soft tissue air seen. No evidence of abscess. 2. Edema noted about the right epididymis, with question of asymmetric  prominence of the right testis. This may reflect right-sided epididymitis. Would correlate for any clinical symptoms to suggest orchitis. 3. Scattered diverticulosis along the distal descending and proximal sigmoid colon, without evidence of diverticulitis. 4. Scattered aortic atherosclerosis. 5. Enlarged prostate noted, with mild heterogeneity. Would correlate with PSA. Electronically Signed   By: Garald Balding M.D.   On: 03/10/2016 04:08   Mr Brain Wo Contrast  Result Date: 03/06/2016 [facility] NEURO[facility]AG[facility] REPORT STUDY DATE: 03/05/16 PATIENT NAME: Jonathan [name] DOB: February 05, 1933 MRN: [id number] ORDERING CLINICIAN: Kathrynn Ducking, MD CLINICAL HISTORY: 82 year old male with transient memory loss. EXAM: MRI brain (without) TECHNIQUE: MRI of the brain without contrast was obtained utilizing 5 mm axial slices with T1, T2, T2 flair, SWI and diffusion weighted views.  T1 sagittal and T2 coronal views were obtained. CONTRAST: no IMAGING SITE: [facility] [address] (3 Tesla MRI)  FINDINGS: No abnormal lesions are seen on diffusion-weighted views to suggest acute ischemia. The cortical sulci, fissures and cisterns are notable for mild generalized atrophy. Lateral, third and fourth ventricle are normal in size and appearance. No extra-axial fluid collections are seen. No evidence of mass effect or midline shift.  Minimal hazy periventricular and subcortical T2 hyperintensities. On sagittal views the posterior fossa, pituitary gland and corpus callosum are unremarkable. No evidence of intracranial hemorrhage on SWI views. The orbits and their contents, paranasal sinuses and calvarium are notable for bilateral lens extractions.  Intracranial flow voids are present.   Mildly abnormal MRI brain (without) demonstrating: 1. Mild generalized atrophy. 2. Mild periventricular and  subcortical small vessel ischemic disease. 3. No acute findings. INTERPRETING PHYSICIAN: Penni Bombard, MD Certified in Neurology, Neurophysiology and Neuroimaging [facility] [address], [place], [place] [address] [phone]   Dg Chest Port 1 View  Result Date: 03/08/2016 CLINICAL DATA:  Sepsis. EXAM: PORTABLE CHEST 1 VIEW COMPARISON:  03/07/2016 FINDINGS: Shallow inspiration. Mild cardiac enlargement. Peribronchial thickening with central interstitial changes consistent with chronic bronchitis. Probable atelectasis in the left lung base. No focal airspace disease or consolidation in the lungs. No blunting of costophrenic angles. No pneumothorax. Calcified and tortuous aorta. IMPRESSION: Shallow inspiration with atelectasis in the left lung base. Chronic bronchitic changes. No evidence of active pulmonary disease. Electronically Signed   By: Lucienne Capers M.D.   On: 03/08/2016 04:08     Microbiology: Recent Results (from the past 240 hour(s))  Blood Culture (routine x 2)     Status: None   Collection Time: 03/07/16  8:07 PM  Result Value Ref Range Status   Specimen Description BLOOD RIGHT ANTECUBITAL  Final   Special Requests BOTTLES DRAWN AEROBIC AND ANAEROBIC 5CC EA  Final   Culture NO GROWTH 5 DAYS  Final   Report Status 03/12/2016 FINAL  Final  Blood Culture (routine x 2)     Status: None   Collection Time: 03/07/16  8:14 PM  Result Value Ref Range Status   Specimen Description BLOOD LEFT HAND  Final   Special Requests BOTTLES DRAWN AEROBIC AND ANAEROBIC 5CC EA  Final   Culture NO GROWTH 5 DAYS  Final   Report Status 03/12/2016 FINAL  Final  Urine culture     Status: None   Collection Time: 03/07/16  8:15 PM  Result Value Ref Range Status   Specimen Description URINE, RANDOM  Final   Special Requests NONE  Final   Culture NO GROWTH  Final   Report Status 03/09/2016 FINAL  Final  Aerobic Culture (superficial specimen)     Status: None (Preliminary result)   Collection Time: 03/10/16  2:33 PM  Result Value Ref Range Status   Specimen  Description PENIS  Final   Special Requests Normal  Final   Gram Stain   Final    MANY WBC PRESENT,BOTH PMN AND MONONUCLEAR MODERATE GRAM POSITIVE COCCI IN CLUSTERS IN PAIRS    Culture   Final    MODERATE METHICILLIN RESISTANT STAPHYLOCOCCUS AUREUS   Report Status PENDING  Incomplete   Organism ID, Bacteria METHICILLIN RESISTANT STAPHYLOCOCCUS AUREUS  Final      Susceptibility   Methicillin resistant staphylococcus aureus - MIC*    CIPROFLOXACIN <=0.5 SENSITIVE Sensitive     ERYTHROMYCIN >=8 RESISTANT Resistant     GENTAMICIN <=0.5 SENSITIVE Sensitive     OXACILLIN >=4 RESISTANT Resistant     TETRACYCLINE <=1 SENSITIVE Sensitive     VANCOMYCIN 1 SENSITIVE Sensitive     TRIMETH/SULFA <=10 SENSITIVE Sensitive     CLINDAMYCIN <=0.25 SENSITIVE Sensitive     RIFAMPIN <=0.5 SENSITIVE Sensitive     Inducible Clindamycin NEGATIVE Sensitive     * MODERATE METHICILLIN RESISTANT STAPHYLOCOCCUS AUREUS     Labs: Basic Metabolic Panel:  Recent Labs Lab 03/07/16 2007 03/08/16 0350 03/11/16 0504 03/12/16 0552  NA 137 140 143 142  K 3.7 3.9 3.2* 3.7  CL 104 109 106 105  CO2 24 24 26 28   GLUCOSE 186* 146* 110* 117*  BUN 13 11 9 11   CREATININE 1.10 1.02 1.03 1.01  CALCIUM 9.0 7.9* 8.5* 8.9  MG  --  1.9  --  2.2  PHOS  --  3.0  --   --    Liver Function Tests:  Recent Labs Lab 03/07/16 2007 03/08/16 0350  AST 22 21  ALT 20 19  ALKPHOS 93 71  BILITOT 0.7 1.0  PROT 6.7 5.7*  ALBUMIN 4.0 3.4*   No results for input(s): LIPASE, AMYLASE in the last 168 hours. No results for input(s): AMMONIA in the last 168 hours. CBC:  Recent Labs Lab 03/07/16 2007 03/08/16 0350 03/09/16 0535 03/10/16 0530 03/11/16 0504 03/12/16 0552 03/13/16 0359  WBC 17.4* 27.7* 17.6* 11.7* 8.0 6.8 5.9  NEUTROABS 14.1* 21.9* 12.8* 8.0*  --   --   --   HGB 14.5 13.1 12.9* 12.4* 11.4* 12.5* 11.2*  HCT 45.3 41.0 40.6 37.9* 36.0* 38.7* 35.7*  MCV 93.6 93.2 93.5 91.8 92.1 92.1 93.0  PLT 144* 129* 112*  95* 132* 152 158   Cardiac Enzymes: No results for input(s): CKTOTAL, CKMB, CKMBINDEX, TROPONINI in the last 168 hours. BNP: Invalid input(s): POCBNP CBG:  Recent Labs Lab 03/12/16 0741 03/12/16 1204 03/12/16 1735 03/12/16 2121 03/13/16 0813  GLUCAP 103* 138* 159* 136* 115*    Time coordinating discharge:  Greater than 30 minutes  Signed:  Montreal Steidle, DO [facility] Pager: [phone] 03/13/2016, 10:57 AM

## 2016-03-13 NOTE — Progress Notes (Addendum)
ANTICOAGULATION CONSULT NOTE - Follow Up Consult  Pharmacy Consult for heparin Indication: atrial fibrillation (while Apixaban on hold)  Labs:  Recent Labs  03/11/16 0504 03/11/16 2004 03/12/16 0552 03/12/16 1251 03/13/16 0359  HGB 11.4*  --  12.5*  --  11.2*  HCT 36.0*  --  38.7*  --  35.7*  PLT 132*  --  152  --  158  APTT  --  35 93* 69*  --   HEPARINUNFRC  --  0.69 0.91*  --  0.56  CREATININE 1.03  --  1.01  --   --     Assessment/Plan:  82yo male therapeutic on heparin for Afib while Apixaban on hold. No bleeding noted, CBC stable.   Will continue gtt at current rate of 1250 units/hr.  Will follow-up daily heparin level, and CBC.  Follow-up for Apixaban restart as well.   Renold Genta, PharmD, BCPS Clinical Pharmacist Phone for today - [place] - [phone] 03/13/2016 8:50 AM

## 2016-03-17 NOTE — Telephone Encounter (Signed)
RB does not have any open appts until 04/20/16. Spoke with pt and he states that he is available from 10/10/-11/9.   RB - did you want to see this pt soon or is it ok for 04/20/16? Thanks!

## 2016-03-17 NOTE — Telephone Encounter (Signed)
Pt aware of appt.

## 2016-03-17 NOTE — Telephone Encounter (Signed)
Attempted to contact pt. Line went dead after several rings. Will try back.

## 2016-03-17 NOTE — Telephone Encounter (Signed)
lmtcb  x1 for pt. He can see RB on 03/26/16 at 10am.

## 2016-03-17 NOTE — Telephone Encounter (Signed)
I'd like to work him in sooner, thanks

## 2016-03-25 DIAGNOSIS — N476 Balanoposthitis: Secondary | ICD-10-CM | POA: Diagnosis not present

## 2016-03-25 DIAGNOSIS — R911 Solitary pulmonary nodule: Secondary | ICD-10-CM | POA: Diagnosis not present

## 2016-03-25 DIAGNOSIS — L03315 Cellulitis of perineum: Secondary | ICD-10-CM | POA: Diagnosis not present

## 2016-03-25 DIAGNOSIS — J189 Pneumonia, unspecified organism: Secondary | ICD-10-CM | POA: Diagnosis not present

## 2016-03-26 ENCOUNTER — Encounter: Payer: Self-pay | Admitting: Emergency Medicine

## 2016-03-26 ENCOUNTER — Other Ambulatory Visit (INDEPENDENT_AMBULATORY_CARE_PROVIDER_SITE_OTHER): Payer: Medicare Other

## 2016-03-26 ENCOUNTER — Ambulatory Visit (INDEPENDENT_AMBULATORY_CARE_PROVIDER_SITE_OTHER): Payer: Medicare Other | Admitting: Emergency Medicine

## 2016-03-26 VITALS — BP 124/66 | HR 62 | Ht 67.0 in | Wt 212.0 lb

## 2016-03-26 DIAGNOSIS — R911 Solitary pulmonary nodule: Secondary | ICD-10-CM | POA: Diagnosis not present

## 2016-03-26 DIAGNOSIS — G4733 Obstructive sleep apnea (adult) (pediatric): Secondary | ICD-10-CM

## 2016-03-26 DIAGNOSIS — J449 Chronic obstructive pulmonary disease, unspecified: Secondary | ICD-10-CM

## 2016-03-26 DIAGNOSIS — K921 Melena: Secondary | ICD-10-CM | POA: Diagnosis not present

## 2016-03-26 LAB — CBC WITH DIFFERENTIAL/PLATELET
Basophils Absolute: 0 10*3/uL (ref 0.0–0.1)
Basophils Relative: 1.1 % (ref 0.0–3.0)
Eosinophils Absolute: 0 10*3/uL (ref 0.0–0.7)
Eosinophils Relative: 0.5 % (ref 0.0–5.0)
HCT: 41.3 % (ref 39.0–52.0)
Hemoglobin: 13.8 g/dL (ref 13.0–17.0)
Lymphocytes Relative: 38.7 % (ref 12.0–46.0)
Lymphs Abs: 1.8 10*3/uL (ref 0.7–4.0)
MCHC: 33.4 g/dL (ref 30.0–36.0)
MCV: 89 fl (ref 78.0–100.0)
Monocytes Absolute: 1.1 10*3/uL — ABNORMAL HIGH (ref 0.1–1.0)
Monocytes Relative: 22.5 % — ABNORMAL HIGH (ref 3.0–12.0)
Neutro Abs: 1.8 10*3/uL (ref 1.4–7.7)
Neutrophils Relative %: 37.2 % — ABNORMAL LOW (ref 43.0–77.0)
Platelets: 218 10*3/uL (ref 150.0–400.0)
RBC: 4.64 Mil/uL (ref 4.22–5.81)
RDW: 14.2 % (ref 11.5–15.5)
WBC: 4.7 10*3/uL (ref 4.0–10.5)

## 2016-03-26 MED ORDER — TIOTROPIUM BROMIDE-OLODATEROL 2.5-2.5 MCG/ACT IN AERS: 2.0000 | INHALATION_SPRAY | Freq: Every day | RESPIRATORY_TRACT | 3 refills | Status: DC

## 2016-03-26 MED ORDER — TIOTROPIUM BROMIDE-OLODATEROL 2.5-2.5 MCG/ACT IN AERS: 2.0000 | INHALATION_SPRAY | Freq: Every day | RESPIRATORY_TRACT | 0 refills | Status: AC

## 2016-03-26 NOTE — Assessment & Plan Note (Signed)
Recent exacerbation and probable community-acquired pneumonia. He is clinically improved. I would like to do a trial changing him from [facility] to [place], see him back in one month to assess his progress. Her all as needed. Walking oximetry today to ensure that he does not desaturate.

## 2016-03-26 NOTE — Progress Notes (Signed)
Subjective:    Patient ID: Jonathan [name], male    DOB: 10-Mar-1933, 82 y.o.   MRN: [id number] HPI 82 yo man, former smoker, hx of A Fib, COPD on nocturnal O2, GERD, also OSA not on CPAP for over 4 yrs. He has had PFT done in [place] Fall 2012 that supported COPD, walking oximetry that showed ? Hypoxemia, ONO with desats and started on . Began to have AE +/- PNA in 1/13 that never really got better. He was admitted to [facility] 2/4-07/21/11 for an AE-COPD associated with A fib + RVR.  Was rx steroids, abx, BD's. He is better but is limited to some degree, not back to prior baseline. He is low on energy. He is having trouble sleeping, difficulty falling asleep and staying asleep. He snores, no witnessed apnea. No real wheezing or coughing at this time.   ROV 10/07/11 -- 78yom hx of A Fib, COPD on nocturnal O2, GERD, also OSA not on CPAP for over 47yrs. Returns after PSG 09/17/11, shows AHI 27/hour. Also obtained his PFT from [facility] from 11/12 - FEV1 71% predicted with positive BD response. Still having trouble w exertion, limited by arthritis more than breathing.Tolerating Symbicort, not really sure it is helping him.    ROV 12/04/11 -- 78yom hx of A Fib, COPD on nocturnal O2, GERD, OSA. We recently started back on CPAP, his download data 5/18-6/15 shows he wears it longer than 4 hours for 58% of the nights. He is working on increasing compliance. All barriers discussed, should be able to overcome. He has new mask that is better. He now has O2 bled in and has been wearing it all night.  Last time we stopped Symbicort - he hasn't missed it. Hasn't used the SABA.   ROV 06/20/12 -- 82 yo man, hx COPD/+BD response, OSA. He is getting his CPAP thru [place], his O2 thru [place]. Uses albuterol very rarely. He still has exertional SOB. He is trying to get new CPAP mask, the company hasn't gotten it to him yet. He hasn't been using CPAP for about two weeks - he is planning to go back to it.   ROV 07/06/13 --  follows up for COPD/+BD response, OSA.  He has done fairly well over the last year although he has noticed an increased WOB and wheeze since he started moving his house furniture to new home.  He has combivent and albuterol (old) available and has tried them w some relief. No longer on scheduled meds. No flares. He is also concerned that his CPAP pressures may not be adequate. Wants new company for CPAP > [employer]. Changing MD's to Dr Armstead Peaks.   ROV 08/29/13 -- Hx of COPD/+BD response, OSA. We changed his DME company, he is wearing CPAP reliably. We added dulera and stopped combivent. He still occassionally naps. Does not fall asleep unintentionally but he is tired when he wakes up in the am. He has an auto-set device, feels that he needs a higher ramp start pressure. He believes that the dulera was a good move.   ROV 03/16/14 -- Hx of COPD/+BD response, OSA. He has been doing fairly well. He is having some nasal congestion and productive cough for about a week. The mucous is thin yellow from his nose and from his chest. He is using nasonex.       ROV 08/17/14 -- follow up visit for COPD, OSA, rhinitis. He reports that he has been needing a nap only a few  hours after he gets up in the am, also wants to sleep in the afternoon. He had [employer] check the device and it is working fine. He wears the CPAP reliably. His wt is down about 15 lbs. He is on dulera, feels that his breathing is not limiting him. He tried stopping it - wasn't sure whether he lost ground but his wife thought he did so he restarted. He notes that he can't exercise but this is due to pain.   ROV 04/02/15 -- Follow-up visit for COPD and obstructive sleep apnea, rhinitis.  He underwent CPAP re-titration after our last visit but I cannot find the results this pm. He has stopped the [facility] a couple times to see if he would miss it > he restarted it. He is having am sleepiness, does not feel restored even though good CPAP compliance.  No  flares of his breathing. Has only used SABA x 1 ever. He is able to exert, hike, climb stairs. ? Whether he is having effects of medications, diltiazem / lyrica / sonata.   ROV 07/11/15 - history of COPD, rhinitis, obstructive sleep apnea on CPAP.   He has been compliant with is CPAP, had a repeat PSG in 12/16 that showed that he needed 8cmH20 + o2.  He continues to be very fatigued. Takes sonata rarely. Is on cymbalta, lyrica. His COPD > was just treated for a flare 3 weeks ago.    ROV 03/26/16 -- patient has a hx COPD, OSA. His symptoms have been less well controlled since our last visit. He has had several flares including treatment for pneumonia and a recent hospitalization for the same - characterized by chills, cough. He was treated by Dr Roseanne Reno, was also treated at [facility] ED as well in April.  A CT scan of his chest was done on that hospitalization that showed some left apical inflammatory change with a nodular component, concerning for a primary nodule that will need to be followed. He is on Dulera bid, nasal steroid. Has albuterol but never uses / needs it. He has a daily cough. Very reliable w his CPAP. He tells me that he saw some melena after he was d/c from the hospital back on his eliquis.it has since resolved. Low energy.       Objective:   Physical Exam Vitals:   03/26/16 1031  BP: 124/66  BP Location: Left Arm  Cuff Size: Normal  Pulse: 62  SpO2: 98%  Weight: 212 lb (96.2 kg)  Height: 5\' 7"  (1.702 m)    Gen: Pleasant, well-nourished, in no distress,  normal affect  ENT: No lesions,  mouth clear,  oropharynx clear, no postnasal drip  Neck: No JVD, no TMG, no carotid bruits  Lungs: No use of accessory muscles, clear without rales or rhonchi  Cardiovascular: RRR, heart sounds normal, no murmur or gallops, no peripheral edema  Musculoskeletal: No deformities, no cyanosis or clubbing  Neuro: alert, non focal  Skin: Warm, no lesions or rashes     Assessment & Plan:   Melena Patient mentions one episode of melena that occurred after he was discharged from the hospital recently back on his Eliquis. Also some fatigue (which could be explained by the PNA). Will check a CBC nbow to compare w the hospital.   COPD (chronic obstructive pulmonary disease) ([place]) Recent exacerbation and probable community-acquired pneumonia. He is clinically improved. I would like to do a trial changing him from [facility] to [place], see him back in one month to  assess his progress. Her all as needed. Walking oximetry today to ensure that he does not desaturate.  Sleep apnea, obstructive We will try changing his CPAP pressure to auto-set from 8-20, continue same supplemental O2 bled in  [name], MD, PhD 03/26/2016, 11:18 AM [facility] [phone] or if no answer [phone]

## 2016-03-26 NOTE — Addendum Note (Signed)
Addended by: Parke Poisson E on: 03/26/2016 04:36 PM   Modules accepted: Orders

## 2016-03-26 NOTE — Assessment & Plan Note (Signed)
Patient mentions one episode of melena that occurred after he was discharged from the hospital recently back on his Eliquis. Also some fatigue (which could be explained by the PNA). Will check a CBC nbow to compare w the hospital.

## 2016-03-26 NOTE — Progress Notes (Signed)
Patient seen in the office today and instructed on use of Stiolto.  Patient expressed understanding and demonstrated technique.Parke Poisson, CMA 03/26/16

## 2016-03-26 NOTE — Assessment & Plan Note (Signed)
We will try changing his CPAP pressure to auto-set from 8-20, continue same supplemental O2 bled in

## 2016-03-26 NOTE — Patient Instructions (Addendum)
We will stop Dulera and do a trial on Stiolto 2 sprays daily for the next month. Take albuterol 2 puffs up to every 4 hours if needed for shortness of breath.  Flu shot up to date Continue your CPAP + oxygen. We will try changing your CPAP to auto-set mode to see if this gives you better pressures.  We will check CBC today.  We will repeat a CT scan of the chest without contrast in mid December 2017 to assess pulmonary nodule.  Follow with Dr Lamonte Sakai or APP here in 1 month to assess your status on the new medication.

## 2016-04-06 ENCOUNTER — Telehealth: Payer: Self-pay | Admitting: Emergency Medicine

## 2016-04-06 MED ORDER — TIOTROPIUM BROMIDE-OLODATEROL 2.5-2.5 MCG/ACT IN AERS: 2.0000 | INHALATION_SPRAY | Freq: Every day | RESPIRATORY_TRACT | 0 refills | Status: DC

## 2016-04-06 NOTE — Telephone Encounter (Signed)
Spoke with pt and advised that samples of Stiolto left at front desk for pick up.

## 2016-04-22 ENCOUNTER — Ambulatory Visit: Payer: Medicare Other | Admitting: Emergency Medicine

## 2016-05-11 ENCOUNTER — Telehealth: Payer: Self-pay | Admitting: Emergency Medicine

## 2016-05-11 ENCOUNTER — Telehealth: Payer: Self-pay

## 2016-05-11 ENCOUNTER — Other Ambulatory Visit: Payer: Self-pay

## 2016-05-11 MED ORDER — TIOTROPIUM BROMIDE-OLODATEROL 2.5-2.5 MCG/ACT IN AERS: 2.0000 | INHALATION_SPRAY | Freq: Every day | RESPIRATORY_TRACT | 1 refills | Status: DC

## 2016-05-11 NOTE — Telephone Encounter (Signed)
Pt. Called in for refills and also stated that he missed his appointment he had at the beginning of this month and he stated he wanted to know if Dr. Lamonte Sakai did not mind squeezing him in some where in his schedule since I saw that he was pretty booked out.

## 2016-05-11 NOTE — Telephone Encounter (Signed)
Spoke with pt. And his rx was sent in. Nothing further needed at this time.

## 2016-05-16 DIAGNOSIS — R609 Edema, unspecified: Secondary | ICD-10-CM | POA: Diagnosis not present

## 2016-05-16 DIAGNOSIS — L0390 Cellulitis, unspecified: Secondary | ICD-10-CM | POA: Diagnosis not present

## 2016-05-19 DIAGNOSIS — M79605 Pain in left leg: Secondary | ICD-10-CM | POA: Diagnosis not present

## 2016-05-19 DIAGNOSIS — L03116 Cellulitis of left lower limb: Secondary | ICD-10-CM | POA: Diagnosis not present

## 2016-05-19 DIAGNOSIS — M79669 Pain in unspecified lower leg: Secondary | ICD-10-CM | POA: Diagnosis not present

## 2016-05-19 DIAGNOSIS — R600 Localized edema: Secondary | ICD-10-CM | POA: Diagnosis not present

## 2016-05-28 DIAGNOSIS — M531 Cervicobrachial syndrome: Secondary | ICD-10-CM | POA: Diagnosis not present

## 2016-05-28 DIAGNOSIS — M9901 Segmental and somatic dysfunction of cervical region: Secondary | ICD-10-CM | POA: Diagnosis not present

## 2016-05-28 DIAGNOSIS — M5134 Other intervertebral disc degeneration, thoracic region: Secondary | ICD-10-CM | POA: Diagnosis not present

## 2016-05-28 DIAGNOSIS — M9902 Segmental and somatic dysfunction of thoracic region: Secondary | ICD-10-CM | POA: Diagnosis not present

## 2016-05-29 DIAGNOSIS — M9901 Segmental and somatic dysfunction of cervical region: Secondary | ICD-10-CM | POA: Diagnosis not present

## 2016-05-29 DIAGNOSIS — M531 Cervicobrachial syndrome: Secondary | ICD-10-CM | POA: Diagnosis not present

## 2016-05-29 DIAGNOSIS — M5134 Other intervertebral disc degeneration, thoracic region: Secondary | ICD-10-CM | POA: Diagnosis not present

## 2016-05-29 DIAGNOSIS — M9902 Segmental and somatic dysfunction of thoracic region: Secondary | ICD-10-CM | POA: Diagnosis not present

## 2016-06-01 ENCOUNTER — Ambulatory Visit (INDEPENDENT_AMBULATORY_CARE_PROVIDER_SITE_OTHER)
Admission: RE | Admit: 2016-06-01 | Discharge: 2016-06-01 | Disposition: A | Discharge status: Home / Self Care | Payer: Medicare Other | Source: Ambulatory Visit | Attending: Emergency Medicine | Admitting: Emergency Medicine

## 2016-06-01 DIAGNOSIS — R911 Solitary pulmonary nodule: Secondary | ICD-10-CM | POA: Diagnosis not present

## 2016-06-03 DIAGNOSIS — G8929 Other chronic pain: Secondary | ICD-10-CM | POA: Diagnosis not present

## 2016-06-03 DIAGNOSIS — R600 Localized edema: Secondary | ICD-10-CM | POA: Diagnosis not present

## 2016-06-03 DIAGNOSIS — L03116 Cellulitis of left lower limb: Secondary | ICD-10-CM | POA: Diagnosis not present

## 2016-06-04 ENCOUNTER — Telehealth: Payer: Self-pay | Admitting: Emergency Medicine

## 2016-06-04 NOTE — Telephone Encounter (Signed)
CT chest- the left lung nodule has resolved, suggesting it was from inflammation. Incidentally noted atherosclerosis and kidney stones.

## 2016-06-04 NOTE — Telephone Encounter (Signed)
Pt requesting CT results from 06-01-16.  CY please advise in RB absence. Thanks.

## 2016-06-04 NOTE — Telephone Encounter (Signed)
Spoke with pt. He is aware of results. Nothing further was needed. 

## 2016-06-05 ENCOUNTER — Other Ambulatory Visit: Payer: Self-pay | Admitting: Cardiology

## 2016-06-19 DIAGNOSIS — H43813 Vitreous degeneration, bilateral: Secondary | ICD-10-CM | POA: Diagnosis not present

## 2016-06-19 DIAGNOSIS — H524 Presbyopia: Secondary | ICD-10-CM | POA: Diagnosis not present

## 2016-06-19 DIAGNOSIS — H04123 Dry eye syndrome of bilateral lacrimal glands: Secondary | ICD-10-CM | POA: Diagnosis not present

## 2016-06-19 DIAGNOSIS — H401131 Primary open-angle glaucoma, bilateral, mild stage: Secondary | ICD-10-CM | POA: Diagnosis not present

## 2016-06-19 DIAGNOSIS — H16223 Keratoconjunctivitis sicca, not specified as Sjogren's, bilateral: Secondary | ICD-10-CM | POA: Diagnosis not present

## 2016-06-19 DIAGNOSIS — H52223 Regular astigmatism, bilateral: Secondary | ICD-10-CM | POA: Diagnosis not present

## 2016-06-19 DIAGNOSIS — H35371 Puckering of macula, right eye: Secondary | ICD-10-CM | POA: Diagnosis not present

## 2016-07-13 ENCOUNTER — Encounter: Payer: Self-pay | Admitting: Emergency Medicine

## 2016-07-13 ENCOUNTER — Ambulatory Visit (INDEPENDENT_AMBULATORY_CARE_PROVIDER_SITE_OTHER): Payer: Medicare Other | Admitting: Emergency Medicine

## 2016-07-13 DIAGNOSIS — J301 Allergic rhinitis due to pollen: Secondary | ICD-10-CM | POA: Diagnosis not present

## 2016-07-13 DIAGNOSIS — J449 Chronic obstructive pulmonary disease, unspecified: Secondary | ICD-10-CM

## 2016-07-13 DIAGNOSIS — G4733 Obstructive sleep apnea (adult) (pediatric): Secondary | ICD-10-CM | POA: Diagnosis not present

## 2016-07-13 NOTE — Progress Notes (Signed)
Subjective:    Patient ID: Jonathan [name], male    DOB: [birth year], 83 y.o.   MRN: [id number] HPI 83 yo man, former smoker, hx of A Fib, COPD on nocturnal O2, GERD, also OSA not on CPAP for over 4 yrs. He has had PFT done in [place] Fall 2012 that supported COPD, walking oximetry that showed ? Hypoxemia, ONO with desats and started on . Began to have AE +/- PNA in 1/13 that never really got better. He was admitted to [facility] 2/4-07/21/11 for an AE-COPD associated with A fib + RVR.  Was rx steroids, abx, BD's. He is better but is limited to some degree, not back to prior baseline. He is low on energy. He is having trouble sleeping, difficulty falling asleep and staying asleep. He snores, no witnessed apnea. No real wheezing or coughing at this time.    ROV 03/26/16 -- patient has a hx COPD, OSA. His symptoms have been less well controlled since our last visit. He has had several flares including treatment for pneumonia and a recent hospitalization for the same - characterized by chills, cough. He was treated by Dr Roseanne Reno, was also treated at [facility] ED as well in April.  A CT scan of his chest was done on that hospitalization that showed some left apical inflammatory change with a nodular component, concerning for a primary nodule that will need to be followed. He is on Dulera bid, nasal steroid. Has albuterol but never uses / needs it. He has a daily cough. Very reliable w his CPAP. He tells me that he saw some melena after he was d/c from the hospital back on his eliquis.it has since resolved. Low energy.   ROV 07/13/16 -- this follow-up visit for COPD, obstructive sleep apnea with good compliance on CPAP. Wears reliably but needs his machine serviced or replaced. He also had a left apical inflammatory nodular change noted on a prior CT scan of the chest. This prompted a repeat CT scan that was done on 06/01/16 that I have reviewed. This showed that his left upper lobe nodular infiltrate had fully resolved.  He was hospitalized in September with CAP, staph cellulitis, sepsis. He feels that he is doing well now - back to baseline. He is able to be active, although he does have some days when he has some memory problems. No significant cough. He does occasionally hear some wheeze. He is able to climb stairs.   He is currently on Stiolto. Notes that he has some elevated occular pressures that are being followed. Also some urinary retention - doesn't happen every time, but sometimes unable to empty completely. He has xopenex for prn, uses very rarely. He does have chronic rhinitis, some hoarse voice. He uses nasonex, loratadine.      Objective:   Physical Exam Vitals:   07/13/16 1210  BP: (!) 150/80  BP Location: Right Arm  Patient Position: Sitting  Cuff Size: Normal  Pulse: (!) 58  SpO2: 95%  Weight: 221 lb 12.8 oz (100.6 kg)  Height: 5\' 7"  (1.702 m)    Gen: Pleasant, well-nourished, in no distress,  normal affect  ENT: No lesions,  mouth clear,  oropharynx clear, no postnasal drip  Neck: No JVD, no TMG, no carotid bruits  Lungs: No use of accessory muscles, clear without rales or rhonchi  Cardiovascular: RRR, heart sounds normal, no murmur or gallops, no peripheral edema  Musculoskeletal: No deformities, no cyanosis or clubbing  Neuro: alert, non focal  Skin: Warm,  no lesions or rashes     Assessment & Plan:  Sleep apnea, obstructive  Continue CPAP every night with current settings. We will contact [employer] to order a new device since your machine needs to be replaced.  COPD (chronic obstructive pulmonary disease) ([place]) Please continue Stiolto 2 puffs daily for now. Call our office if you have any further difficulty with urinary retention or increased ocular pressures at your exam. Keep Xopenex available to use 2 puffs if needed for shortness of breath.  Allergic rhinitis, seasonal Continue Nasonex, continue loratadine.   [name], MD, PhD 07/13/2016, 12:35 PM [place] [phone] or if no answer [phone]

## 2016-07-13 NOTE — Assessment & Plan Note (Signed)
  Continue CPAP every night with current settings. We will contact [employer] to order a new device since your machine needs to be replaced.

## 2016-07-13 NOTE — Assessment & Plan Note (Signed)
Continue Nasonex, continue loratadine.

## 2016-07-13 NOTE — Patient Instructions (Signed)
Please continue Stiolto 2 puffs daily for now. Call our office if you have any further difficulty with urinary retention or increased ocular pressures at your exam. Keep Xopenex available to use 2 puffs if needed for shortness of breath. Continue Nasonex, continue loratadine. Continue CPAP every night with current settings. We will contact [employer] to order a new device since your machine needs to be replaced. Follow with Dr Lamonte Sakai in 6 months or sooner if you have any problems

## 2016-07-13 NOTE — Assessment & Plan Note (Signed)
Please continue Stiolto 2 puffs daily for now. Call our office if you have any further difficulty with urinary retention or increased ocular pressures at your exam. Keep Xopenex available to use 2 puffs if needed for shortness of breath.

## 2016-07-15 NOTE — Progress Notes (Signed)
HPI: FU atrial fibrillation. Previously with a fib but converted spontaneously to sinus rhythm. Note the patient had an echocardiogram in [place] in November of 2012 that showed an ejection fraction of 59% with no valvular abnormalities. Patient also with significant COPD. Abdominal ultrasound March 2015 showed no aneurysm. Nuclear study July 2016 showed ejection fraction 56% and normal perfusion. Since I last saw him the patient has dyspnea with more extreme activities but not with routine activities. It is relieved with rest. It is not associated with chest pain. There is no orthopnea, PND. There is no syncope or palpitations. There is no exertional chest pain. Mild pedal edema. He has had 3 separate bouts of cellulitis.   Current Outpatient Prescriptions  Medication Sig Dispense Refill  . acetaminophen (TYLENOL) 500 MG tablet Take 2 tablets (1,000 mg total) by mouth 2 (two) times daily. 360 tablet 1  . alendronate (FOSAMAX) 70 MG tablet Take 70 mg by mouth every Monday.  4  . allopurinol (ZYLOPRIM) 300 MG tablet Take 300 mg by mouth every evening.     . brimonidine (ALPHAGAN) 0.15 % ophthalmic solution Place 1 drop into the left eye 2 (two) times daily.     . Calcium Lactate 750 MG TABS Take 1 tablet by mouth daily.     . colchicine (COLCRYS) 0.6 MG tablet Take 0.6 mg by mouth daily as needed (flareups).     . diltiazem (CARDIZEM CD) 180 MG 24 hr capsule Take 1 capsule (180 mg total) by mouth daily. 90 capsule 3  . doxazosin (CARDURA) 8 MG tablet Take 1 tablet (8 mg total) by mouth daily. PATIENT NEEDS OFFICE VISIT FOR ADDITIONAL REFILLS 30 tablet 0  . DULoxetine (CYMBALTA) 30 MG capsule Take 30 mg by mouth daily.     Marland Kitchen ELIQUIS 5 MG TABS tablet TAKE 1 TABLET (5 MG TOTAL) BY MOUTH 2 (TWO) TIMES DAILY. 60 tablet 4  . fluticasone (FLONASE) 50 MCG/ACT nasal spray Place 2 sprays into the nose 2 (two) times daily as needed for allergies.     . furosemide (LASIX) 40 MG tablet Take 40 mg by  mouth as needed.    . latanoprost (XALATAN) 0.005 % ophthalmic solution Place 1 drop into both eyes at bedtime. 7.5 mL 3  . levalbuterol (XOPENEX HFA) 45 MCG/ACT inhaler Inhale 1-2 puffs into the lungs every 4 (four) hours as needed for wheezing. 1 Inhaler 12  . levothyroxine (SYNTHROID, LEVOTHROID) 75 MCG tablet Take 1 tablet (75 mcg total) by mouth daily before breakfast. 90 tablet 3  . loratadine (CLARITIN) 10 MG tablet Take 10 mg by mouth daily.    . magnesium oxide (MAG-OX) 400 MG tablet Take 400 mg by mouth daily.    . Melatonin 5 MG TABS Take 5 mg by mouth at bedtime.    . mometasone (NASONEX) 50 MCG/ACT nasal spray Place 2 sprays into the nose daily.    . Multiple Vitamin (MULTIVITAMIN) tablet Take 1 tablet by mouth daily.    . nitroGLYCERIN (NITROSTAT) 0.4 MG SL tablet Place 1 tablet (0.4 mg total) under the tongue every 5 (five) minutes as needed for chest pain. 90 tablet 3  . NON FORMULARY Inhale 2 L into the lungs at bedtime. CPAP with O2 2L    . omeprazole (PRILOSEC) 20 MG capsule Take 20 mg by mouth daily.    . potassium gluconate (EQL POTASSIUM GLUCONATE) 595 (99 K) MG TABS tablet Take 595 mg by mouth daily.    Marland Kitchen  pregabalin (LYRICA) 100 MG capsule Take 100 mg by mouth at bedtime.     . tadalafil (CIALIS) 20 MG tablet Take 20 mg by mouth daily as needed. For erectile dysfunction    . TAMIFLU 75 MG capsule Take 1 tablet by mouth daily.    . Tiotropium Bromide-Olodaterol (STIOLTO RESPIMAT) 2.5-2.5 MCG/ACT AERS Inhale 2 puffs into the lungs daily. 3 Inhaler 1  . zaleplon (SONATA) 10 MG capsule Take 10 mg by mouth at bedtime.      No current facility-administered medications for this visit.      Past Medical History:  Diagnosis Date  . Atrial fibrillation ([place])   . BPH (benign prostatic hyperplasia)   . Bronchitis   . Cataract   . COPD (chronic obstructive pulmonary disease) (HCC)    2 liters O2 HS  . Depression   . Fatty tumor    waste and back  . Fibromyalgia   . GERD  (gastroesophageal reflux disease)   . Glaucoma    bilateral eyes  . Gout   . Hereditary and idiopathic peripheral neuropathy 08/28/2015  . Hypertension   . Hypothyroidism   . Hypoxia   . Insomnia   . Memory disorder 08/28/2015  . On home oxygen therapy    at night with cpap  . Osteoarthritis   . Peripheral neuropathy ([place])   . Rotator cuff tear, left   . Sleep apnea    uses cpap-add oxygen at night  . Spinal compression fracture (HCC) seventh vertebre  . Transient alteration of awareness 08/28/2015  . Type 2 diabetes mellitus ([place]) 03/08/2016  . Wears glasses     Past Surgical History:  Procedure Laterality Date  . APPENDECTOMY  age 89  . CARPAL TUNNEL RELEASE Right    early 2000s  . CATARACT EXTRACTION     bilateral  . CHOLECYSTECTOMY  age 61  . EYE SURGERY    . FOOT ARTHRODESIS Right 02/02/2013   Procedure: RIGHT HALLUX METATARSAL PHALANGEAL JOINT ARTHRODESIS ;  Surgeon: Wylene Simmer, MD;  Location: [facility];  Service: Orthopedics;  Laterality: Right;  . INGUINAL HERNIA REPAIR  age 53   rt side  . NASAL CONCHA BULLOSA RESECTION  age 82  . PROSTATE SURGERY    . SHOULDER ARTHROSCOPY W/ ROTATOR CUFF REPAIR Right    early 2000s  . tonsil    . VASECTOMY  age 54    Social History   Social History  . Marital status: Single    Spouse name: N/A  . Number of children: N/A  . Years of education: N/A   Occupational History  . RETIRED Retired   Social History Main Topics  . Smoking status: Former Smoker    Packs/day: 2.00    Years: 10.00    Types: Cigarettes    Quit date: 07/18/1975  . Smokeless tobacco: Never Used  . Alcohol use Yes     Comment: 1-2 drinks/day  . Drug use: No  . Sexual activity: Not on file   Other Topics Concern  . Not on file   Social History Narrative  . No narrative on file    Family History  Problem Relation Age of Onset  . Coronary artery disease Brother   . Heart disease Father   . Lung cancer Father   . Kidney  cancer Father   . Prostate cancer Father   . Arthritis Mother   . Lung cancer Mother     ROS: no fevers or chills, productive cough, hemoptysis,  dysphasia, odynophagia, melena, hematochezia, dysuria, hematuria, rash, seizure activity, orthopnea, PND, claudication. Remaining systems are negative.  Physical Exam: Well-developed obese in no acute distress.  Skin is warm and dry.  HEENT is normal.  Neck is supple.  Chest is clear to auscultation with normal expansion.  Cardiovascular exam is regular rate and rhythm.  Abdominal exam nontender or distended. No masses palpated. Extremities show trace edema. neuro grossly intact  ECG-sinus rhythm at a rate of 58. Cannot rule out prior septal infarct.  A/P  1 paroxysmal atrial fibrillation-patient remains in sinus rhythm. Continue Cardizem for rate control if atrial fibrillation recurs. Continue apixaban. Check Hgb and renal function.  2 Hypertension-blood pressure elevated. He also has mild pedal edema. Add hydrochlorothiazide 25 mg daily. Check potassium and renal function in 1 week. Advance regimen as needed.  3 COPD-followed by pulmonary.   Kirk Ruths, MD

## 2016-07-20 DIAGNOSIS — G8929 Other chronic pain: Secondary | ICD-10-CM | POA: Diagnosis not present

## 2016-07-20 DIAGNOSIS — M1711 Unilateral primary osteoarthritis, right knee: Secondary | ICD-10-CM | POA: Diagnosis not present

## 2016-07-20 DIAGNOSIS — M25561 Pain in right knee: Secondary | ICD-10-CM | POA: Diagnosis not present

## 2016-07-20 DIAGNOSIS — M1712 Unilateral primary osteoarthritis, left knee: Secondary | ICD-10-CM | POA: Diagnosis not present

## 2016-07-20 DIAGNOSIS — M25562 Pain in left knee: Secondary | ICD-10-CM | POA: Diagnosis not present

## 2016-07-22 ENCOUNTER — Ambulatory Visit (INDEPENDENT_AMBULATORY_CARE_PROVIDER_SITE_OTHER): Payer: Medicare Other | Admitting: Cardiology

## 2016-07-22 ENCOUNTER — Encounter: Payer: Self-pay | Admitting: Cardiology

## 2016-07-22 VITALS — BP 152/90 | HR 58 | Ht 67.0 in | Wt 219.4 lb

## 2016-07-22 DIAGNOSIS — I480 Paroxysmal atrial fibrillation: Secondary | ICD-10-CM | POA: Diagnosis not present

## 2016-07-22 DIAGNOSIS — I4891 Unspecified atrial fibrillation: Secondary | ICD-10-CM

## 2016-07-22 DIAGNOSIS — I10 Essential (primary) hypertension: Secondary | ICD-10-CM | POA: Diagnosis not present

## 2016-07-22 MED ORDER — HYDROCHLOROTHIAZIDE 25 MG PO TABS: 25.0000 mg | ORAL_TABLET | Freq: Every day | ORAL | 3 refills | Status: DC

## 2016-07-22 NOTE — Patient Instructions (Signed)
Medication Instructions:   START HCTZ 25 MG ONCE DAILY  Labwork:  Your physician recommends that you return for lab work in: [place]:  Your physician wants you to follow-up in: [place] will receive a reminder letter in the mail two months in advance. If you don't receive a letter, please call our office to schedule the follow-up appointment.   If you need a refill on your cardiac medications before your next appointment, please call your pharmacy.

## 2016-08-11 ENCOUNTER — Other Ambulatory Visit: Payer: Self-pay | Admitting: *Deleted

## 2016-08-11 MED ORDER — TIOTROPIUM BROMIDE-OLODATEROL 2.5-2.5 MCG/ACT IN AERS: 2.0000 | INHALATION_SPRAY | Freq: Every day | RESPIRATORY_TRACT | 1 refills | Status: DC

## 2016-08-12 DIAGNOSIS — H532 Diplopia: Secondary | ICD-10-CM | POA: Diagnosis not present

## 2016-08-12 DIAGNOSIS — H5034 Intermittent alternating exotropia: Secondary | ICD-10-CM | POA: Diagnosis not present

## 2016-08-12 DIAGNOSIS — H52223 Regular astigmatism, bilateral: Secondary | ICD-10-CM | POA: Diagnosis not present

## 2016-08-25 ENCOUNTER — Encounter: Payer: Self-pay | Admitting: *Deleted

## 2016-09-01 DIAGNOSIS — M25561 Pain in right knee: Secondary | ICD-10-CM | POA: Diagnosis not present

## 2016-09-01 DIAGNOSIS — G8929 Other chronic pain: Secondary | ICD-10-CM | POA: Diagnosis not present

## 2016-09-01 DIAGNOSIS — M25562 Pain in left knee: Secondary | ICD-10-CM | POA: Diagnosis not present

## 2016-09-01 DIAGNOSIS — M170 Bilateral primary osteoarthritis of knee: Secondary | ICD-10-CM | POA: Diagnosis not present

## 2016-09-02 DIAGNOSIS — N471 Phimosis: Secondary | ICD-10-CM | POA: Diagnosis not present

## 2016-09-02 DIAGNOSIS — N472 Paraphimosis: Secondary | ICD-10-CM | POA: Diagnosis not present

## 2016-09-02 DIAGNOSIS — N476 Balanoposthitis: Secondary | ICD-10-CM | POA: Diagnosis not present

## 2016-09-02 DIAGNOSIS — I4891 Unspecified atrial fibrillation: Secondary | ICD-10-CM | POA: Diagnosis not present

## 2016-09-02 LAB — CBC
HCT: 41.9 % (ref 38.5–50.0)
Hemoglobin: 13.8 g/dL (ref 13.2–17.1)
MCH: 29.3 pg (ref 27.0–33.0)
MCHC: 32.9 g/dL (ref 32.0–36.0)
MCV: 89 fL (ref 80.0–100.0)
MPV: 12.1 fL (ref 7.5–12.5)
Platelets: 128 10*3/uL — ABNORMAL LOW (ref 140–400)
RBC: 4.71 MIL/uL (ref 4.20–5.80)
RDW: 14.5 % (ref 11.0–15.0)
WBC: 4.8 10*3/uL (ref 3.8–10.8)

## 2016-09-03 ENCOUNTER — Encounter: Payer: Self-pay | Admitting: Cardiology

## 2016-09-03 LAB — BASIC METABOLIC PANEL
BUN: 16 mg/dL (ref 7–25)
CO2: 28 mmol/L (ref 20–31)
Calcium: 8.9 mg/dL (ref 8.6–10.3)
Chloride: 104 mmol/L (ref 98–110)
Creat: 1.07 mg/dL (ref 0.70–1.11)
Glucose, Bld: 121 mg/dL — ABNORMAL HIGH (ref 65–99)
Potassium: 4.1 mmol/L (ref 3.5–5.3)
Sodium: 141 mmol/L (ref 135–146)

## 2016-09-06 NOTE — Patient Instructions (Addendum)
  Test(s) ordered today. Your results will be released to [place] (or called to you) after review, usually within 72hours after test completion. If any changes need to be made, you will be notified at that same time.   Medications reviewed and updated.  Changes include starting doxycycline for your cellulitis.   Your prescription(s) have been submitted to your pharmacy. Please take as directed and contact our office if you believe you are having problem(s) with the medication(s).  A referral was ordered for neurology for a memory evaluation  Please followup in 3 months

## 2016-09-06 NOTE — Progress Notes (Signed)
Subjective:    Patient ID: Jonathan [name], male    DOB: [birth year], 83 y.o.   MRN: [id number]  HPI He is here to establish with a new pcp.     Infection in right thigh:  He started having symptoms 3-4 days ago.  It became red, pain, there was pus and started applying an antibiotic cream, which has not helped.  He denies fever.    Cellulitis:  He has had three episodes - he has had it on both lower legs and perineum.  He has scar tissue in the penile area and may need a circumcision.  He is seeing urology.  Afib, Hypertension: He is taking his medication daily. He is compliant with a low sodium diet.  He denies chest pain, palpitations, shortness of breath and regular headaches. He is not exercising regularly.  He does monitor his blood pressure at home - 128-130/70's.    COPD, OSA:  He follows with pulmonary.  He uses cpap nightly.  He denies sob, cough and wheeze on a daily basis.    GERD:  He is taking his medication daily as prescribed.  He denies any GERD symptoms and feels his GERD is well controlled.    Hypothyroidism:  He does not recall when his thyroid was last checked.  He is taking his medication daily.  He feels tired shortly after eating breakfast.  He naps daily.  This has been going of for a while.   Difficulty sleeping:  He takes melatonin at night.  He takes sonata occasionally - he has not taken it in about one month.   Memory concerns:  He has not had it formally evaluated.  His mother had severe dementia - it was not alzheimer's.   He would like this evaluated further.   Peripheral neuropathy: the cause is unknown.   GERD:  He is taking his medication daily as prescribed.  He denies any GERD symptoms and feels his GERD is well controlled.    Knee osteoarthritis:  He is following with orthopedics and getting injections.    Chronic back pain, leg/feet pain:  He follows with preferred pain management.  He has had several injections and has had radiofrequency  ablation.  He is taking tylenol 1000 mg twice daily, he takes the oxycodone/oxycontin not daily- he takes it sporadically.  He takes if for severe pain only.  He takes lyrica nightly, sometimes in the morning.  He takes cymbalta for pain and depression.   Depression: He is taking his medication daily as prescribed. He denies any side effects from the medication. He feels his depression is well controlled and he is happy with his current dose of medication.    He leaves for [place] next month.   Medications and allergies reviewed with patient and updated if appropriate.  Patient Active Problem List   Diagnosis Date Noted  . Depression 09/07/2016  . Osteoporosis 09/07/2016  . Melena 03/26/2016  . Cellulitis, perineum   . Hypothyroidism 03/08/2016  . GERD (gastroesophageal reflux disease) 03/08/2016  . Gout 03/08/2016  . Atrial fibrillation ([place]) 03/08/2016  . Glaucoma 03/08/2016  . Penile swelling 03/08/2016  . Type 2 diabetes mellitus ([place]) 03/08/2016  . Sepsis ([place]) 03/07/2016  . Transient alteration of awareness 08/28/2015  . Hereditary and idiopathic peripheral neuropathy 08/28/2015  . Memory disorder 08/28/2015  . Fatigue 01/08/2015  . Bruit 08/15/2013  . Hypertension 12/04/2011  . Allergic rhinitis, seasonal 12/04/2011  . Sleep apnea, obstructive 08/25/2011  . Atrial fibrillation  with RVR ([place]) 07/20/2011  . Chest pain 07/20/2011  . CKD (chronic kidney disease) 07/20/2011  . COPD (chronic obstructive pulmonary disease) ([place]) 07/18/2011    Current Outpatient Prescriptions on File Prior to Visit  Medication Sig Dispense Refill  . acetaminophen (TYLENOL) 500 MG tablet Take 2 tablets (1,000 mg total) by mouth 2 (two) times daily. 360 tablet 1  . alendronate (FOSAMAX) 70 MG tablet Take 70 mg by mouth every Monday.  4  . allopurinol (ZYLOPRIM) 300 MG tablet Take 300 mg by mouth every evening.     . brimonidine (ALPHAGAN) 0.15 % ophthalmic solution Place 1 drop into the left eye  2 (two) times daily.     . Calcium Lactate 750 MG TABS Take 1 tablet by mouth daily.     . colchicine (COLCRYS) 0.6 MG tablet Take 0.6 mg by mouth daily as needed (flareups).     . diltiazem (CARDIZEM CD) 180 MG 24 hr capsule Take 1 capsule (180 mg total) by mouth daily. 90 capsule 3  . doxazosin (CARDURA) 8 MG tablet Take 1 tablet (8 mg total) by mouth daily. PATIENT NEEDS OFFICE VISIT FOR ADDITIONAL REFILLS 30 tablet 0  . DULoxetine (CYMBALTA) 30 MG capsule Take 30 mg by mouth daily.     Marland Kitchen ELIQUIS 5 MG TABS tablet TAKE 1 TABLET (5 MG TOTAL) BY MOUTH 2 (TWO) TIMES DAILY. 60 tablet 4  . hydrochlorothiazide (HYDRODIURIL) 25 MG tablet Take 1 tablet (25 mg total) by mouth daily. 90 tablet 3  . latanoprost (XALATAN) 0.005 % ophthalmic solution Place 1 drop into both eyes at bedtime. 7.5 mL 3  . levalbuterol (XOPENEX HFA) 45 MCG/ACT inhaler Inhale 1-2 puffs into the lungs every 4 (four) hours as needed for wheezing. 1 Inhaler 12  . levothyroxine (SYNTHROID, LEVOTHROID) 75 MCG tablet Take 1 tablet (75 mcg total) by mouth daily before breakfast. 90 tablet 3  . loratadine (CLARITIN) 10 MG tablet Take 10 mg by mouth daily.    . magnesium oxide (MAG-OX) 400 MG tablet Take 400 mg by mouth daily.    . Melatonin 5 MG TABS Take 5 mg by mouth at bedtime.    . mometasone (NASONEX) 50 MCG/ACT nasal spray Place 2 sprays into the nose daily.    . Multiple Vitamin (MULTIVITAMIN) tablet Take 1 tablet by mouth daily.    . nitroGLYCERIN (NITROSTAT) 0.4 MG SL tablet Place 1 tablet (0.4 mg total) under the tongue every 5 (five) minutes as needed for chest pain. 90 tablet 3  . NON FORMULARY Inhale 2 L into the lungs at bedtime. CPAP with O2 2L    . omeprazole (PRILOSEC) 20 MG capsule Take 20 mg by mouth daily.    . potassium gluconate (EQL POTASSIUM GLUCONATE) 595 (99 K) MG TABS tablet Take 595 mg by mouth daily.    . pregabalin (LYRICA) 100 MG capsule Take 100 mg by mouth at bedtime. Take 1 tablet in AM, Take 2 tablet in  PM    . tadalafil (CIALIS) 20 MG tablet Take 20 mg by mouth daily as needed. For erectile dysfunction    . Tiotropium Bromide-Olodaterol (STIOLTO RESPIMAT) 2.5-2.5 MCG/ACT AERS Inhale 2 puffs into the lungs daily. 3 Inhaler 1  . zaleplon (SONATA) 10 MG capsule Take 10 mg by mouth at bedtime.     . [DISCONTINUED] calcium-vitamin D (OSCAL WITH D) 500-200 MG-UNIT per tablet Take 1 tablet by mouth daily.    . [DISCONTINUED] diphenhydrAMINE (SOMINEX) 25 MG tablet Take 50 mg by  mouth at bedtime as needed. For sleep     No current facility-administered medications on file prior to visit.     Past Medical History:  Diagnosis Date  . Atrial fibrillation ([place])   . BPH (benign prostatic hyperplasia)   . Bronchitis   . Cataract   . COPD (chronic obstructive pulmonary disease) (HCC)    2 liters O2 HS  . Depression   . Fatty tumor    waste and back  . Fibromyalgia   . GERD (gastroesophageal reflux disease)   . Glaucoma    bilateral eyes  . Gout   . Hereditary and idiopathic peripheral neuropathy 08/28/2015  . Hypertension   . Hypothyroidism   . Hypoxia   . Insomnia   . Memory disorder 08/28/2015  . On home oxygen therapy    at night with cpap  . Osteoarthritis   . Peripheral neuropathy ([place])   . Rotator cuff tear, left   . Sleep apnea    uses cpap-add oxygen at night  . Spinal compression fracture (HCC) seventh vertebre  . Transient alteration of awareness 08/28/2015  . Type 2 diabetes mellitus ([place]) 03/08/2016  . Wears glasses     Past Surgical History:  Procedure Laterality Date  . APPENDECTOMY  age 77  . CARPAL TUNNEL RELEASE Right    early 2000s  . CATARACT EXTRACTION     bilateral  . CHOLECYSTECTOMY  age 50  . EYE SURGERY    . FOOT ARTHRODESIS Right 02/02/2013   Procedure: RIGHT HALLUX METATARSAL PHALANGEAL JOINT ARTHRODESIS ;  Surgeon: Wylene Simmer, MD;  Location: [facility];  Service: Orthopedics;  Laterality: Right;  . INGUINAL HERNIA REPAIR  age 36   rt  side  . NASAL CONCHA BULLOSA RESECTION  age 67  . PROSTATE SURGERY    . SHOULDER ARTHROSCOPY W/ ROTATOR CUFF REPAIR Right    early 2000s  . tonsil    . VASECTOMY  age 43    Social History   Social History  . Marital status: Married    Spouse name: N/A  . Number of children: N/A  . Years of education: N/A   Occupational History  . RETIRED Retired   Social History Main Topics  . Smoking status: Former Smoker    Packs/day: 2.00    Years: 10.00    Types: Cigarettes    Quit date: 07/18/1975  . Smokeless tobacco: Never Used  . Alcohol use Yes     Comment: 1-2 drinks/day  . Drug use: No  . Sexual activity: Not Asked   Other Topics Concern  . None   Social History Narrative  . None    Family History  Problem Relation Age of Onset  . Coronary artery disease Brother   . Heart disease Father   . Lung cancer Father   . Kidney cancer Father   . Prostate cancer Father   . Arthritis Mother   . Lung cancer Mother     Review of Systems  Constitutional: Positive for fatigue. Negative for fever.  Eyes: Negative for visual disturbance.  Respiratory: Negative for cough, shortness of breath and wheezing.   Cardiovascular: Positive for leg swelling (wears support hose). Negative for chest pain and palpitations.  Gastrointestinal: Positive for diarrhea (occasional). Negative for abdominal pain, blood in stool, constipation and nausea.       No gerd - controlled; flatulence  Musculoskeletal: Positive for gait problem (feels clumsy at times - feet get tangled, no falls).  Neurological: Negative for  dizziness, light-headedness and headaches.       Objective:   Vitals:   09/07/16 0910  BP: (!) 162/70  Pulse: (!) 58  Resp: 16  Temp: 97.9 F (36.6 C)   Filed Weights   09/07/16 0910  Weight: 215 lb (97.5 kg)   Body mass index is 33.67 kg/m.  Wt Readings from Last 3 Encounters:  09/07/16 215 lb (97.5 kg)  07/22/16 219 lb 6.4 oz (99.5 kg)  07/13/16 221 lb 12.8 oz (100.6  kg)     Physical Exam  Constitutional: He appears well-developed and well-nourished. No distress.  HENT:  Head: Normocephalic and atraumatic.  Eyes: Conjunctivae are normal.  Neck: Neck supple. No tracheal deviation present. No thyromegaly present.  Cardiovascular: Normal rate, regular rhythm and normal heart sounds.   No murmur heard. Pulmonary/Chest: Effort normal and breath sounds normal. No respiratory distress. He has no wheezes. He has no rales.  Abdominal: Soft. He exhibits no distension. There is no tenderness. There is no rebound and no guarding.  Musculoskeletal: He exhibits edema (trace b/l LE).  Lymphadenopathy:    He has no cervical adenopathy.  Neurological: He is alert.  Skin: He is not diaphoretic.  erythema right distal upper leg with area of induration - no active discharge, tender, warm  Psychiatric: He has a normal mood and affect. His behavior is normal. Thought content normal.          Assessment & Plan:   See Problem List for Assessment and Plan of chronic medical problems.

## 2016-09-07 ENCOUNTER — Other Ambulatory Visit (INDEPENDENT_AMBULATORY_CARE_PROVIDER_SITE_OTHER): Payer: Medicare Other

## 2016-09-07 ENCOUNTER — Encounter: Payer: Self-pay | Admitting: Internal Medicine

## 2016-09-07 ENCOUNTER — Ambulatory Visit (INDEPENDENT_AMBULATORY_CARE_PROVIDER_SITE_OTHER): Payer: Medicare Other | Admitting: Internal Medicine

## 2016-09-07 VITALS — BP 162/70 | HR 58 | Temp 97.9°F | Resp 16 | Ht 67.0 in | Wt 215.0 lb

## 2016-09-07 DIAGNOSIS — N189 Chronic kidney disease, unspecified: Secondary | ICD-10-CM | POA: Diagnosis not present

## 2016-09-07 DIAGNOSIS — M4310 Spondylolisthesis, site unspecified: Secondary | ICD-10-CM | POA: Insufficient documentation

## 2016-09-07 DIAGNOSIS — I10 Essential (primary) hypertension: Secondary | ICD-10-CM | POA: Diagnosis not present

## 2016-09-07 DIAGNOSIS — F329 Major depressive disorder, single episode, unspecified: Secondary | ICD-10-CM | POA: Insufficient documentation

## 2016-09-07 DIAGNOSIS — L03115 Cellulitis of right lower limb: Secondary | ICD-10-CM

## 2016-09-07 DIAGNOSIS — G609 Hereditary and idiopathic neuropathy, unspecified: Secondary | ICD-10-CM | POA: Diagnosis not present

## 2016-09-07 DIAGNOSIS — M25561 Pain in right knee: Secondary | ICD-10-CM | POA: Diagnosis not present

## 2016-09-07 DIAGNOSIS — E038 Other specified hypothyroidism: Secondary | ICD-10-CM

## 2016-09-07 DIAGNOSIS — E119 Type 2 diabetes mellitus without complications: Secondary | ICD-10-CM | POA: Diagnosis not present

## 2016-09-07 DIAGNOSIS — L02415 Cutaneous abscess of right lower limb: Secondary | ICD-10-CM

## 2016-09-07 DIAGNOSIS — R413 Other amnesia: Secondary | ICD-10-CM | POA: Diagnosis not present

## 2016-09-07 DIAGNOSIS — M170 Bilateral primary osteoarthritis of knee: Secondary | ICD-10-CM | POA: Diagnosis not present

## 2016-09-07 DIAGNOSIS — M810 Age-related osteoporosis without current pathological fracture: Secondary | ICD-10-CM

## 2016-09-07 DIAGNOSIS — F3289 Other specified depressive episodes: Secondary | ICD-10-CM

## 2016-09-07 DIAGNOSIS — K219 Gastro-esophageal reflux disease without esophagitis: Secondary | ICD-10-CM | POA: Diagnosis not present

## 2016-09-07 DIAGNOSIS — G8929 Other chronic pain: Secondary | ICD-10-CM | POA: Diagnosis not present

## 2016-09-07 DIAGNOSIS — F32A Depression, unspecified: Secondary | ICD-10-CM | POA: Insufficient documentation

## 2016-09-07 DIAGNOSIS — M25562 Pain in left knee: Secondary | ICD-10-CM | POA: Diagnosis not present

## 2016-09-07 DIAGNOSIS — M109 Gout, unspecified: Secondary | ICD-10-CM | POA: Diagnosis not present

## 2016-09-07 HISTORY — DX: Spondylolisthesis, site unspecified: M43.10

## 2016-09-07 HISTORY — DX: Major depressive disorder, single episode, unspecified: F32.9

## 2016-09-07 LAB — COMPREHENSIVE METABOLIC PANEL
ALT: 22 U/L (ref 0–53)
AST: 17 U/L (ref 0–37)
Albumin: 4.7 g/dL (ref 3.5–5.2)
Alkaline Phosphatase: 81 U/L (ref 39–117)
BUN: 20 mg/dL (ref 6–23)
CO2: 29 mEq/L (ref 19–32)
Calcium: 10.1 mg/dL (ref 8.4–10.5)
Chloride: 100 mEq/L (ref 96–112)
Creatinine, Ser: 1.06 mg/dL (ref 0.40–1.50)
GFR: 71.22 mL/min (ref >60.00)
Glucose, Bld: 127 mg/dL — ABNORMAL HIGH (ref 70–99)
Potassium: 3.9 mEq/L (ref 3.5–5.1)
Sodium: 139 mEq/L (ref 135–145)
Total Bilirubin: 0.5 mg/dL (ref 0.2–1.2)
Total Protein: 7.1 g/dL (ref 6.0–8.3)

## 2016-09-07 LAB — TSH: TSH: 4.8 u[IU]/mL — ABNORMAL HIGH (ref 0.35–4.50)

## 2016-09-07 LAB — HEMOGLOBIN A1C: Hgb A1c MFr Bld: 6.8 % — ABNORMAL HIGH (ref 4.6–6.5)

## 2016-09-07 MED ORDER — DOXYCYCLINE HYCLATE 100 MG PO TABS: 100.0000 mg | ORAL_TABLET | Freq: Two times a day (BID) | ORAL | 0 refills | Status: DC

## 2016-09-07 NOTE — Assessment & Plan Note (Signed)
Controlled, stable Continue current dose of medication  

## 2016-09-07 NOTE — Assessment & Plan Note (Signed)
GERD controlled Continue daily medication  

## 2016-09-07 NOTE — Assessment & Plan Note (Signed)
cmp today 

## 2016-09-07 NOTE — Assessment & Plan Note (Signed)
Concerns regarding his memory Will refer to Jonathan [name] for further eval

## 2016-09-07 NOTE — Assessment & Plan Note (Addendum)
BP well controlled at home - advised him to bring his cuff in next visit Working on weight loss Check cmp Continue current medications at current doses

## 2016-09-07 NOTE — Assessment & Plan Note (Signed)
Check tsh  Titrate med dose if needed  

## 2016-09-07 NOTE — Progress Notes (Signed)
Pre visit review using our clinic review tool, if applicable. No additional management support is needed unless otherwise documented below in the visit note. 

## 2016-09-07 NOTE — Assessment & Plan Note (Signed)
Controlled with allopurinol - continue

## 2016-09-07 NOTE — Assessment & Plan Note (Signed)
Not on medication Controlled with diet Work on weight loss Stressed regular exercise Check a1c today

## 2016-09-07 NOTE — Assessment & Plan Note (Signed)
Continue fosamax Encouraged regular exercise

## 2016-09-07 NOTE — Assessment & Plan Note (Signed)
Taking lyrica, cymbalta Continue above

## 2016-09-07 NOTE — Assessment & Plan Note (Signed)
Present for 3-4 days - has drained but no active drainage Has had sepsis in past from cellulitis Start doxycycline x 10 days Warm compresses Monitor closely - Call if no improvement

## 2016-09-09 ENCOUNTER — Encounter: Payer: Self-pay | Admitting: Psychology

## 2016-09-09 ENCOUNTER — Encounter: Payer: Self-pay | Admitting: Neurology

## 2016-09-10 DIAGNOSIS — R3915 Urgency of urination: Secondary | ICD-10-CM | POA: Diagnosis not present

## 2016-09-10 DIAGNOSIS — N471 Phimosis: Secondary | ICD-10-CM | POA: Diagnosis not present

## 2016-09-14 ENCOUNTER — Telehealth: Payer: Self-pay | Admitting: Internal Medicine

## 2016-09-14 ENCOUNTER — Other Ambulatory Visit: Payer: Self-pay | Admitting: Emergency Medicine

## 2016-09-14 ENCOUNTER — Other Ambulatory Visit: Payer: Self-pay | Admitting: *Deleted

## 2016-09-14 DIAGNOSIS — I480 Paroxysmal atrial fibrillation: Secondary | ICD-10-CM

## 2016-09-14 DIAGNOSIS — E039 Hypothyroidism, unspecified: Secondary | ICD-10-CM

## 2016-09-14 MED ORDER — LEVOTHYROXINE SODIUM 88 MCG PO TABS: 88.0000 ug | ORAL_TABLET | Freq: Every day | ORAL | 1 refills | Status: DC

## 2016-09-14 MED ORDER — DILTIAZEM HCL ER COATED BEADS 180 MG PO CP24: 180.0000 mg | ORAL_CAPSULE | Freq: Every day | ORAL | 3 refills | Status: DC

## 2016-09-14 NOTE — Telephone Encounter (Signed)
Patient has called back.  Gave MD response on labs.

## 2016-09-14 NOTE — Telephone Encounter (Signed)
Noted  

## 2016-09-15 DIAGNOSIS — M170 Bilateral primary osteoarthritis of knee: Secondary | ICD-10-CM | POA: Diagnosis not present

## 2016-09-15 DIAGNOSIS — M25562 Pain in left knee: Secondary | ICD-10-CM | POA: Diagnosis not present

## 2016-09-15 DIAGNOSIS — M25561 Pain in right knee: Secondary | ICD-10-CM | POA: Diagnosis not present

## 2016-09-15 DIAGNOSIS — G8929 Other chronic pain: Secondary | ICD-10-CM | POA: Diagnosis not present

## 2016-09-16 ENCOUNTER — Other Ambulatory Visit: Payer: Self-pay

## 2016-09-16 MED ORDER — HYDROCHLOROTHIAZIDE 25 MG PO TABS: 25.0000 mg | ORAL_TABLET | Freq: Every day | ORAL | 3 refills | Status: DC

## 2016-09-18 ENCOUNTER — Other Ambulatory Visit: Payer: Self-pay | Admitting: Cardiology

## 2016-10-19 DIAGNOSIS — M150 Primary generalized (osteo)arthritis: Secondary | ICD-10-CM | POA: Diagnosis not present

## 2016-10-19 DIAGNOSIS — M797 Fibromyalgia: Secondary | ICD-10-CM | POA: Diagnosis not present

## 2016-10-19 DIAGNOSIS — M1009 Idiopathic gout, multiple sites: Secondary | ICD-10-CM | POA: Diagnosis not present

## 2016-10-19 DIAGNOSIS — Z6833 Body mass index (BMI) 33.0-33.9, adult: Secondary | ICD-10-CM | POA: Diagnosis not present

## 2016-10-19 DIAGNOSIS — R5382 Chronic fatigue, unspecified: Secondary | ICD-10-CM | POA: Diagnosis not present

## 2016-10-19 DIAGNOSIS — G894 Chronic pain syndrome: Secondary | ICD-10-CM | POA: Diagnosis not present

## 2016-10-19 DIAGNOSIS — M79641 Pain in right hand: Secondary | ICD-10-CM | POA: Diagnosis not present

## 2016-10-19 DIAGNOSIS — E669 Obesity, unspecified: Secondary | ICD-10-CM | POA: Diagnosis not present

## 2016-10-19 DIAGNOSIS — M79642 Pain in left hand: Secondary | ICD-10-CM | POA: Diagnosis not present

## 2016-10-22 ENCOUNTER — Ambulatory Visit (INDEPENDENT_AMBULATORY_CARE_PROVIDER_SITE_OTHER): Payer: Medicare Other | Admitting: Psychology

## 2016-10-22 DIAGNOSIS — R413 Other amnesia: Secondary | ICD-10-CM

## 2016-10-22 DIAGNOSIS — F32A Depression, unspecified: Secondary | ICD-10-CM

## 2016-10-22 DIAGNOSIS — F329 Major depressive disorder, single episode, unspecified: Secondary | ICD-10-CM

## 2016-10-22 NOTE — Progress Notes (Signed)
NEUROPSYCHOLOGICAL INTERVIEW (CPT: [id number])  Name: Jonathan [name] Date of Birth: [birth year] Date of Interview: 10/22/2016  Reason for Referral:  Challen Spainhour is a 83 y.o. male who is referred for neuropsychological evaluation by Dr. Billey Gosling of [facility] due to concerns about memory decline. This patient is accompanied in the office by his wife, Pamala Hurry, who supplements the history.  History of Presenting Problem:  Mr. Mwangi reported a history of memory decline with superimposed discreet episodes of intermittent cognitive clouding. He does have a history of head injury secondary to a fall from about 8 feet up; this occurred in 2002. He did not have LOC but was dazed afterwards. He thinks it has been since that fall that he has episodes of "thick fog and can't work through it", but other times he can feel fairly or completely "normal". He reported frequent difficulty with short term memory. His wife will ask him to do something and he won't remember it at all or until much later. He forgets other information she tells him. Also, they were recently on a trip, and even on the sixth day of being in the same hotel he could not recall the name of the hotel. The patient also reported some long term memory loss as well, including inability to recall some events as much as 20 years ago that his wife recalls very well, such as trips they took. Upon direct questioning, his wife states that he does repeat questions but not all the time. He misplaces items somewhat more frequently. He endorses difficulty concentrating. He has always been one to start multiple tasks before finishing each one; his wife says he has always been "sort of ADD". He also endorses slowed processing speed and word finding difficulty. His wife has noted that he has more difficulty reading aloud. He has some comprehension difficulty in conversation but this could be because of hearing loss. He does have hearing aids but he  doesn't feel they help. His wife notes that he sometimes seems to have a hard time understanding the crux of something - his mind will go to a detail and focus on that instead of the bigger picture. He has always had this issue but she thinks it has gotten more frequent. The patient states that his sense of direction is still good, but his wife reports that he is more confused about where he is, especially if in a newer place. He can't seem to get [facility] [place], straight in his mind although they visit their daughter there regularly. His wife has noticed he has more uncertainty about directions when driving outside of their area, and this is a big change related to how he used to be.  MRI of the brain on 03/05/2016 reportedly revealed the following:  1. Mild generalized atrophy. 2. Mild periventricular and subcortical small vessel ischemic disease. 3. No acute findings.  Family history is reportedly significant for severe dementia in his late mother. The patient and his wife worry that he may have dementia as well.  Current Functioning: The patient lives with his wife in their own home. He drives and has not had any MVAs that were his fault. He manages his medications independently. He has forgotten to take them a little bit more frequently but it is not a regular occurrence. He manages some of the finances. Paperwork has always been a challenge for him; he procrastinates but does not miss bill payments. He has trouble tracking and remembering his appointments. He uses  the calendar function on his phone but he forgets to check it. He has missed quite a few appointments. He is able to cook if he needs to.   Physically, the patient reported that he is in an "enormous" amount of pain which he has had for years, secondary to fibromyalgia, arthritis, and peripheral neuropathy. He takes opioids rarely. His wife notes that how he feels fluctuates a lot. He can go through a phase of feeling really bad for a  while but then be better for a while. How he feels is not constant or predictable. He does complain of being tired a lot. He wonders if this is secondary to his medications. He wears a CPAP with supplemental nocturnal oxygen. He does not always wear his CPAP for naps.  He has some difficulty with balance. He has not had any falls recently. He noted that he will hit his left shoulder on a wall or door frame when walking. This has happened for years but has never caused any serious problem.   He sleeps well, 8-10 hours a night. He has a history of insomnia but hasn't had much trouble falling asleep lately. He takes 0-2 naps per day.   He almost never has an appetite. His mind will get foggy so he knows his blood sugar must be low and will eat something. He does not check his blood sugars at home. He has no history of hypo or hyperglycemic episode requiring medical attention.  He denied history of hallucinations or psychosis.   Psychiatric History: He describes his mood as pretty good. He does have a long history of depression and has been very close to suicide in the past. He denied any recent suicidal ideation/intention. He reported reduced motivation but not depressed mood. His wife states he can get angry fairly easily and "doesn't always handle it so well". He does not get physically aggressive though. He endorses a history of anxiety with a tendency to catastrophize. He reported that he has had "tons" of counseling, "most of it extremely ineffective". He has never been hospitalized psychiatrically. He denied history of substance abuse or dependence.    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 2 children and 2 steps. 6 grands together. Alcohol/Tobacco/Substances: Less than daily alcohol - but drink when go out for a  meal and before going to bed and want to sleep. Typically double [employer]. Quit smoking 1977. No SA.   Medical History: Past Medical History:  Diagnosis Date  . Atrial fibrillation ([place])   . BPH (benign prostatic hyperplasia)   . Bronchitis   . Cataract   . COPD (chronic obstructive pulmonary disease) ([facility])    2 liters O2 HS  . Depression   . Fatty tumor    waste and back  . Fibromyalgia   . GERD (gastroesophageal reflux disease)   . Glaucoma    bilateral eyes  . Gout   . Hereditary and idiopathic peripheral neuropathy 08/28/2015  . Hypertension   . Hypothyroidism   . Hypoxia   . Insomnia   . Memory disorder 08/28/2015  . On home oxygen therapy    at night with cpap  . Osteoarthritis   . Peripheral neuropathy ([place])   . Rotator cuff tear, left   . Sleep apnea    uses cpap-add oxygen at night  . Spinal compression fracture ([place]) seventh  vertebre  . Transient alteration of awareness 08/28/2015  . Type 2 diabetes mellitus ([place]) 03/08/2016  . Wears glasses       Current Medications:  Outpatient Encounter Prescriptions as of 10/22/2016  Medication Sig  . acetaminophen (TYLENOL) 500 MG tablet Take 2 tablets (1,000 mg total) by mouth 2 (two) times daily.  Marland Kitchen alendronate (FOSAMAX) 70 MG tablet Take 70 mg by mouth every Monday.  Marland Kitchen allopurinol (ZYLOPRIM) 300 MG tablet Take 300 mg by mouth every evening.   . Biotin 5000 MCG TABS Take 5,000 mcg by mouth daily.  . brimonidine (ALPHAGAN) 0.15 % ophthalmic solution Place 1 drop into the left eye 2 (two) times daily.   . Calcium Lactate 750 MG TABS Take 1 tablet by mouth daily.   . Cholecalciferol (VITAMIN D3 PO) Take 1,000 mg by mouth daily.  . colchicine (COLCRYS) 0.6 MG tablet Take 0.6 mg by mouth daily as needed (flareups).   . cyclobenzaprine (FLEXERIL) 10 MG tablet Take 10 mg by mouth as needed for muscle spasms.  Marland Kitchen diltiazem (CARDIZEM CD) 180 MG 24 hr capsule Take 1 capsule (180 mg total) by mouth daily.  Marland Kitchen doxazosin  (CARDURA) 8 MG tablet Take 1 tablet (8 mg total) by mouth daily. PATIENT NEEDS OFFICE VISIT FOR ADDITIONAL REFILLS  . doxycycline (VIBRA-TABS) 100 MG tablet Take 1 tablet (100 mg total) by mouth 2 (two) times daily.  . DULoxetine (CYMBALTA) 30 MG capsule Take 30 mg by mouth daily.   Marland Kitchen ELIQUIS 5 MG TABS tablet TAKE 1 TABLET (5 MG TOTAL) BY MOUTH 2 (TWO) TIMES DAILY.  . hydrochlorothiazide (HYDRODIURIL) 25 MG tablet Take 1 tablet (25 mg total) by mouth daily.  Marland Kitchen latanoprost (XALATAN) 0.005 % ophthalmic solution Place 1 drop into both eyes at bedtime.  . levalbuterol (XOPENEX HFA) 45 MCG/ACT inhaler Inhale 1-2 puffs into the lungs every 4 (four) hours as needed for wheezing.  Marland Kitchen levothyroxine (SYNTHROID, LEVOTHROID) 88 MCG tablet Take 1 tablet (88 mcg total) by mouth daily.  Marland Kitchen loratadine (CLARITIN) 10 MG tablet Take 10 mg by mouth daily.  . magnesium oxide (MAG-OX) 400 MG tablet Take 400 mg by mouth daily.  . Melatonin 5 MG TABS Take 5 mg by mouth at bedtime.  . mometasone (NASONEX) 50 MCG/ACT nasal spray Place 2 sprays into the nose daily.  . Multiple Vitamin (MULTIVITAMIN) tablet Take 1 tablet by mouth daily.  . nitroGLYCERIN (NITROSTAT) 0.4 MG SL tablet Place 1 tablet (0.4 mg total) under the tongue every 5 (five) minutes as needed for chest pain.  . NON FORMULARY Inhale 2 L into the lungs at bedtime. CPAP with O2 2L  . omeprazole (PRILOSEC) 20 MG capsule Take 20 mg by mouth daily.  Marland Kitchen oxyCODONE (OXY IR/ROXICODONE) 5 MG immediate release tablet Take 5 mg by mouth every 4 (four) hours as needed for severe pain.  Marland Kitchen oxyCODONE (OXYCONTIN) 10 mg 12 hr tablet Take 10 mg by mouth every 12 (twelve) hours as needed.  Marland Kitchen oxyCODONE (ROXICODONE) 15 MG immediate release tablet Take 15 mg by mouth every 4 (four) hours as needed for pain.  . potassium gluconate (EQL POTASSIUM GLUCONATE) 595 (99 K) MG TABS tablet Take 595 mg by mouth daily.  . pregabalin (LYRICA) 100 MG capsule Take 100 mg by mouth at bedtime. Take  1 tablet in AM, Take 2 tablet in PM  . tadalafil (CIALIS) 20 MG tablet Take 20 mg by mouth daily as needed. For erectile dysfunction  . Tiotropium Bromide-Olodaterol (STIOLTO RESPIMAT)  2.5-2.5 MCG/ACT AERS Inhale 2 puffs into the lungs daily.  . vitamin C (ASCORBIC ACID) 500 MG tablet Take 500 mg by mouth daily.  . zaleplon (SONATA) 10 MG capsule Take 10 mg by mouth at bedtime.    No facility-administered encounter medications on file as of 10/22/2016.      Behavioral Observations:   Appearance: Neatly and appropriately dressed and groomed Gait: Ambulated independently, no gross abnormalities observed Speech: Fluent; normal rate, rhythm and volume Thought process: Linear, goal directed Affect: Full, euthymic Interpersonal: Pleasant, appropriate   TESTING: There is medical necessity to proceed with neuropsychological assessment as the results will be used to aid in differential diagnosis and clinical decision-making and to inform specific treatment recommendations. Per the patient, his wife and medical records reviewed, there has been a change in cognitive functioning and a reasonable suspicion of dementia. Following the clinical interview, the patient completed a full battery of neuropsychological testing with my psychometrician under my supervision.   PLAN: Mr. Murata and his wife will see me for a follow-up session at which time his test performances and my impressions and treatment recommendations will be reviewed in detail.   Full neuropsychological evaluation report to follow.

## 2016-10-22 NOTE — Progress Notes (Signed)
   Neuropsychology Note  Jonathan [name] came in today for 1 hour of neuropsychological testing with technician, Milana Kidney, BS, under the supervision of Dr. Macarthur Critchley. The patient did not appear overtly distressed by the testing session, per behavioral observation or via self-report to the technician. Rest breaks were offered. Jonathan [name] will return within 2 weeks for a feedback session with Dr. Si Raider at which time his test performances, clinical impressions and treatment recommendations will be reviewed in detail. The patient understands he can contact our office should he require our assistance before this time.  Full report to follow.

## 2016-10-23 ENCOUNTER — Other Ambulatory Visit: Payer: Self-pay | Admitting: Cardiology

## 2016-10-25 ENCOUNTER — Encounter: Payer: Self-pay | Admitting: Psychology

## 2016-10-27 ENCOUNTER — Telehealth: Payer: Self-pay | Admitting: Internal Medicine

## 2016-10-27 ENCOUNTER — Telehealth: Payer: Self-pay | Admitting: Cardiology

## 2016-10-27 NOTE — Telephone Encounter (Signed)
New message     Pt spoke with insurance company about cost of Eliquis , they said for you to start the process of getting a tier reduction , please call and let him know if he can do it

## 2016-10-27 NOTE — Telephone Encounter (Signed)
Pt called and said the the prescription of Lyrica is too expensive. He spoke with his insurance company and they told him that Dr Quay Burow can do a tier reduction so that hopefully it would be cheaper for him. Please advise.

## 2016-10-28 NOTE — Telephone Encounter (Signed)
Left message for pt to call with number I need to call for tier exception.

## 2016-10-29 NOTE — Telephone Encounter (Signed)
Patient calling..number for tier exception is [phone]. Patient also left ID # [id number]. Thanks.

## 2016-10-30 ENCOUNTER — Telehealth: Payer: Self-pay | Admitting: Cardiology

## 2016-10-30 NOTE — Telephone Encounter (Signed)
Called and started tier exception for eliquis, case # [id number].

## 2016-11-02 ENCOUNTER — Encounter: Payer: Self-pay | Admitting: *Deleted

## 2016-11-02 NOTE — Telephone Encounter (Signed)
Appeals letter for eliquis mailed to [facility], [place], [place] [id number].

## 2016-11-03 NOTE — Telephone Encounter (Signed)
Spoke with [employer] 3 cap / day $290, 1 cap / day $177. Tier reduction has been started. Ref no. [id number]. Should receive a response in 24-72 hours.

## 2016-11-11 NOTE — Telephone Encounter (Signed)
LVM informing pt that Lyrica was approved. Med was approved as a tier 1 copay through 06/14/17.

## 2016-11-12 NOTE — Telephone Encounter (Signed)
eliquis approved through 06/14/17.

## 2016-11-12 NOTE — Telephone Encounter (Signed)
Called pt to verify he received VM and had to LVM informing of tier change approval.

## 2016-11-16 ENCOUNTER — Other Ambulatory Visit: Payer: Self-pay | Admitting: Pharmacist Clinician (PhC)/ Clinical Pharmacy Specialist

## 2016-11-16 MED ORDER — APIXABAN 5 MG PO TABS: 5.0000 mg | ORAL_TABLET | Freq: Two times a day (BID) | ORAL | 1 refills | Status: DC

## 2016-11-24 DIAGNOSIS — H401131 Primary open-angle glaucoma, bilateral, mild stage: Secondary | ICD-10-CM | POA: Diagnosis not present

## 2016-11-24 DIAGNOSIS — H16223 Keratoconjunctivitis sicca, not specified as Sjogren's, bilateral: Secondary | ICD-10-CM | POA: Diagnosis not present

## 2016-11-24 DIAGNOSIS — H04123 Dry eye syndrome of bilateral lacrimal glands: Secondary | ICD-10-CM | POA: Diagnosis not present

## 2016-11-25 ENCOUNTER — Telehealth: Payer: Self-pay | Admitting: Emergency Medicine

## 2016-11-25 DIAGNOSIS — M25561 Pain in right knee: Secondary | ICD-10-CM | POA: Diagnosis not present

## 2016-11-25 DIAGNOSIS — G8929 Other chronic pain: Secondary | ICD-10-CM | POA: Diagnosis not present

## 2016-11-25 DIAGNOSIS — M25562 Pain in left knee: Secondary | ICD-10-CM | POA: Diagnosis not present

## 2016-11-25 DIAGNOSIS — M1712 Unilateral primary osteoarthritis, left knee: Secondary | ICD-10-CM | POA: Diagnosis not present

## 2016-11-25 DIAGNOSIS — M545 Low back pain: Secondary | ICD-10-CM | POA: Diagnosis not present

## 2016-11-25 NOTE — Telephone Encounter (Signed)
Patient requesting a tier exception for his Stiolto. It is currently a tier 2 and costs $250 for a 90 day supply. Tier 1 costs $25 for a 90 day supply.   Called [facility] at [phone]. Member's ID is [id number].   [facility] will fax over the determination within the next 2 days.

## 2016-11-26 NOTE — Telephone Encounter (Signed)
Called [facility] and spoke with Charlene. Tier exception is still pending. Jonathan [name] states that we should receive a determination by the end of today.

## 2016-11-27 MED ORDER — TIOTROPIUM BROMIDE-OLODATEROL 2.5-2.5 MCG/ACT IN AERS: 2.0000 | INHALATION_SPRAY | Freq: Every day | RESPIRATORY_TRACT | 3 refills | Status: DC

## 2016-11-27 NOTE — Telephone Encounter (Signed)
Called [employer] at [phone] to check status of PA.  Member's ID is [id number].  Spoke with Cherie Ouch, medication approved, dropped to Tier 1 co-pay, approved through 06/14/2017  Pt aware and this has been sent to [facility] per patient request. Nothing further needed.

## 2016-11-30 DIAGNOSIS — M797 Fibromyalgia: Secondary | ICD-10-CM | POA: Diagnosis not present

## 2016-11-30 DIAGNOSIS — Z6833 Body mass index (BMI) 33.0-33.9, adult: Secondary | ICD-10-CM | POA: Diagnosis not present

## 2016-11-30 DIAGNOSIS — R5382 Chronic fatigue, unspecified: Secondary | ICD-10-CM | POA: Diagnosis not present

## 2016-11-30 DIAGNOSIS — G894 Chronic pain syndrome: Secondary | ICD-10-CM | POA: Diagnosis not present

## 2016-11-30 DIAGNOSIS — M150 Primary generalized (osteo)arthritis: Secondary | ICD-10-CM | POA: Diagnosis not present

## 2016-11-30 DIAGNOSIS — E669 Obesity, unspecified: Secondary | ICD-10-CM | POA: Diagnosis not present

## 2016-11-30 DIAGNOSIS — M1009 Idiopathic gout, multiple sites: Secondary | ICD-10-CM | POA: Diagnosis not present

## 2016-12-08 ENCOUNTER — Encounter: Payer: Self-pay | Admitting: Internal Medicine

## 2016-12-08 ENCOUNTER — Ambulatory Visit (INDEPENDENT_AMBULATORY_CARE_PROVIDER_SITE_OTHER): Payer: Medicare Other | Admitting: Internal Medicine

## 2016-12-08 ENCOUNTER — Other Ambulatory Visit (INDEPENDENT_AMBULATORY_CARE_PROVIDER_SITE_OTHER): Payer: Medicare Other

## 2016-12-08 VITALS — BP 154/76 | HR 64 | Temp 98.0°F | Resp 16 | Wt 212.0 lb

## 2016-12-08 DIAGNOSIS — Z23 Encounter for immunization: Secondary | ICD-10-CM

## 2016-12-08 DIAGNOSIS — I10 Essential (primary) hypertension: Secondary | ICD-10-CM | POA: Diagnosis not present

## 2016-12-08 DIAGNOSIS — E038 Other specified hypothyroidism: Secondary | ICD-10-CM | POA: Diagnosis not present

## 2016-12-08 DIAGNOSIS — E119 Type 2 diabetes mellitus without complications: Secondary | ICD-10-CM | POA: Diagnosis not present

## 2016-12-08 DIAGNOSIS — E039 Hypothyroidism, unspecified: Secondary | ICD-10-CM | POA: Diagnosis not present

## 2016-12-08 DIAGNOSIS — E66811 Obesity, class 1: Secondary | ICD-10-CM

## 2016-12-08 DIAGNOSIS — G609 Hereditary and idiopathic neuropathy, unspecified: Secondary | ICD-10-CM | POA: Diagnosis not present

## 2016-12-08 DIAGNOSIS — E669 Obesity, unspecified: Secondary | ICD-10-CM

## 2016-12-08 DIAGNOSIS — K219 Gastro-esophageal reflux disease without esophagitis: Secondary | ICD-10-CM | POA: Diagnosis not present

## 2016-12-08 DIAGNOSIS — R079 Chest pain, unspecified: Secondary | ICD-10-CM

## 2016-12-08 HISTORY — DX: Obesity, class 1: E66.811

## 2016-12-08 HISTORY — DX: Obesity, unspecified: E66.9

## 2016-12-08 LAB — COMPREHENSIVE METABOLIC PANEL
ALT: 22 U/L (ref 0–53)
AST: 17 U/L (ref 0–37)
Albumin: 4.7 g/dL (ref 3.5–5.2)
Alkaline Phosphatase: 76 U/L (ref 39–117)
BUN: 27 mg/dL — ABNORMAL HIGH (ref 6–23)
CO2: 31 mEq/L (ref 19–32)
Calcium: 10.8 mg/dL — ABNORMAL HIGH (ref 8.4–10.5)
Chloride: 102 mEq/L (ref 96–112)
Creatinine, Ser: 1.23 mg/dL (ref 0.40–1.50)
GFR: 59.95 mL/min — ABNORMAL LOW (ref >60.00)
Glucose, Bld: 131 mg/dL — ABNORMAL HIGH (ref 70–99)
Potassium: 4.4 mEq/L (ref 3.5–5.1)
Sodium: 141 mEq/L (ref 135–145)
Total Bilirubin: 0.6 mg/dL (ref 0.2–1.2)
Total Protein: 6.9 g/dL (ref 6.0–8.3)

## 2016-12-08 LAB — LIPID PANEL
Cholesterol: 195 mg/dL (ref 0–200)
HDL: 54.3 mg/dL (ref >39.00)
LDL Cholesterol: 120 mg/dL — ABNORMAL HIGH (ref 0–99)
NonHDL: 140.92
Total CHOL/HDL Ratio: 4
Triglycerides: 103 mg/dL (ref 0.0–149.0)
VLDL: 20.6 mg/dL (ref 0.0–40.0)

## 2016-12-08 LAB — MICROALBUMIN / CREATININE URINE RATIO
Creatinine,U: 137.2 mg/dL
Microalb Creat Ratio: 15.5 mg/g (ref 0.0–30.0)
Microalb, Ur: 21.2 mg/dL — ABNORMAL HIGH (ref 0.0–1.9)

## 2016-12-08 LAB — TSH: TSH: 2.7 u[IU]/mL (ref 0.35–4.50)

## 2016-12-08 LAB — HEMOGLOBIN A1C: Hgb A1c MFr Bld: 6.8 % — ABNORMAL HIGH (ref 4.6–6.5)

## 2016-12-08 MED ORDER — ZOSTER VAC RECOMB ADJUVANTED 50 MCG/0.5ML IM SUSR: 0.5000 mL | Freq: Once | INTRAMUSCULAR | 1 refills | Status: AC

## 2016-12-08 MED ORDER — PREGABALIN 100 MG PO CAPS: ORAL_CAPSULE | ORAL | 0 refills | Status: DC

## 2016-12-08 MED ORDER — TIZANIDINE HCL 4 MG PO CAPS: 4.0000 mg | ORAL_CAPSULE | Freq: Every day | ORAL | 5 refills | Status: DC

## 2016-12-08 MED ORDER — TADALAFIL 20 MG PO TABS: 20.0000 mg | ORAL_TABLET | Freq: Every day | ORAL | 1 refills | Status: DC | PRN

## 2016-12-08 NOTE — Patient Instructions (Addendum)
  Test(s) ordered today. Your results will be released to [facility] (or called to you) after review, usually within 72hours after test completion. If any changes need to be made, you will be notified at that same time.  All other Health Maintenance issues reviewed.   All recommended immunizations and age-appropriate screenings are up-to-date or discussed.  prevnar pneumonia immunization administered today.   Medications reviewed and updated.   No changes recommended at this time.  Your prescription(s) have been submitted to your pharmacy. Please take as directed and contact our office if you believe you are having problem(s) with the medication(s).   Please followup in 3 months

## 2016-12-08 NOTE — Assessment & Plan Note (Addendum)
BP variable at home Stressed regular exercise, weight loss  He wants to avoid medication changes and will try to make more of an effort to lose weight and exercise Discussed BP goal and consequences of uncontrolled BP Monitor at home Follow up in 3 months, sooner if elevated Labs today

## 2016-12-08 NOTE — Assessment & Plan Note (Signed)
Taking lyrica and cymbalta Will continue

## 2016-12-08 NOTE — Assessment & Plan Note (Signed)
Check a1c Low sugar / carb diet Stressed regular exercise, weight loss May need to start medication

## 2016-12-08 NOTE — Assessment & Plan Note (Signed)
With complications of diabetes, htn, hyperlipidemia, gout Not exercising, not always eating well Stressed weight loss  - to avoid more medications and worsening of his chronic medial problems Stressed regular exercise - advised 30 minutes 5 days  - consider water exercises given knee arthritis Increase veges, fruits, lean protein, dec carbs, decrease portions  Goal  Is to lose 12 lbs in the next 3 months F/u in 3 months

## 2016-12-08 NOTE — Assessment & Plan Note (Signed)
Check tsh  Titrate med dose if needed  

## 2016-12-08 NOTE — Progress Notes (Signed)
Subjective:    Patient ID: Jonathan [name], male    DOB: [birth year], 83 y.o.   MRN: [id number]  HPI The patient is here for follow up.  Hypertension: He is taking his medication daily. He is compliant with a low sodium diet.  He has leg swelling and wears compression socks daily.  He denies chest pain, palpitations, shortness of breath and regular headaches. He is not exercising regularly.  He does not monitor his blood pressure at home.    Diabetes: He is taking his medication daily as prescribed. He is compliant with a diabetic diet. He is not exercising regularly. He checks his feet daily and denies foot lesions. He is up-to-date with an ophthalmology examination.   Hypothyroidism:  He is taking his medication daily.  His energy level imporved after increasing his dose after his last visit.  He denies any recent changes in energy or weight that are unexplained.   Leg cramps at night:  He has been having a lot of cramping in his legs.  He has them in the upper and lower legs.  He is not exercising.  He is not sure if he drink enough fluids.    Medications and allergies reviewed with patient and updated if appropriate.  Patient Active Problem List   Diagnosis Date Noted  . Depression 09/07/2016  . Melena 03/26/2016  . Hypothyroidism 03/08/2016  . GERD (gastroesophageal reflux disease) 03/08/2016  . Gout 03/08/2016  . Glaucoma 03/08/2016  . Type 2 diabetes mellitus ([place]) 03/08/2016  . Transient alteration of awareness 08/28/2015  . Hereditary and idiopathic peripheral neuropathy 08/28/2015  . Memory disorder 08/28/2015  . Fatigue 01/08/2015  . Bruit 08/15/2013  . Hypertension 12/04/2011  . Allergic rhinitis, seasonal 12/04/2011  . Sleep apnea, obstructive 08/25/2011  . Atrial fibrillation with RVR ([place]) 07/20/2011  . CKD (chronic kidney disease) 07/20/2011  . COPD (chronic obstructive pulmonary disease) ([place]) 07/18/2011    Current Outpatient Prescriptions on File Prior to  Visit  Medication Sig Dispense Refill  . acetaminophen (TYLENOL) 500 MG tablet Take 2 tablets (1,000 mg total) by mouth 2 (two) times daily. 360 tablet 1  . alendronate (FOSAMAX) 70 MG tablet Take 70 mg by mouth every Monday.  4  . allopurinol (ZYLOPRIM) 300 MG tablet Take 300 mg by mouth every evening.     Marland Kitchen apixaban (ELIQUIS) 5 MG TABS tablet Take 1 tablet (5 mg total) by mouth 2 (two) times daily. 180 tablet 1  . Biotin 5000 MCG TABS Take 5,000 mcg by mouth daily.    . brimonidine (ALPHAGAN) 0.15 % ophthalmic solution Place 1 drop into the left eye 2 (two) times daily.     . Calcium Lactate 750 MG TABS Take 1 tablet by mouth daily.     . Cholecalciferol (VITAMIN D3 PO) Take 1,000 mg by mouth daily.    . colchicine (COLCRYS) 0.6 MG tablet Take 0.6 mg by mouth daily as needed (flareups).     . diltiazem (CARDIZEM CD) 180 MG 24 hr capsule Take 1 capsule (180 mg total) by mouth daily. 90 capsule 3  . doxazosin (CARDURA) 8 MG tablet Take 1 tablet (8 mg total) by mouth daily. PATIENT NEEDS OFFICE VISIT FOR ADDITIONAL REFILLS 30 tablet 0  . DULoxetine (CYMBALTA) 30 MG capsule Take 60 mg by mouth daily.     . hydrochlorothiazide (HYDRODIURIL) 25 MG tablet Take 1 tablet (25 mg total) by mouth daily. 90 tablet 3  . latanoprost (XALATAN) 0.005 % ophthalmic solution  Place 1 drop into both eyes at bedtime. 7.5 mL 3  . levalbuterol (XOPENEX HFA) 45 MCG/ACT inhaler Inhale 1-2 puffs into the lungs every 4 (four) hours as needed for wheezing. 1 Inhaler 12  . levothyroxine (SYNTHROID, LEVOTHROID) 88 MCG tablet Take 1 tablet (88 mcg total) by mouth daily. 90 tablet 1  . loratadine (CLARITIN) 10 MG tablet Take 10 mg by mouth daily.    . magnesium oxide (MAG-OX) 400 MG tablet Take 400 mg by mouth daily.    . Melatonin 5 MG TABS Take 5 mg by mouth at bedtime.    . mometasone (NASONEX) 50 MCG/ACT nasal spray Place 2 sprays into the nose daily.    . Multiple Vitamin (MULTIVITAMIN) tablet Take 1 tablet by mouth  daily.    . nitroGLYCERIN (NITROSTAT) 0.4 MG SL tablet Place 1 tablet (0.4 mg total) under the tongue every 5 (five) minutes as needed for chest pain. 90 tablet 3  . NON FORMULARY Inhale 2 L into the lungs at bedtime. CPAP with O2 2L    . omeprazole (PRILOSEC) 20 MG capsule Take 20 mg by mouth daily.    Marland Kitchen oxyCODONE (OXY IR/ROXICODONE) 5 MG immediate release tablet Take 5 mg by mouth every 4 (four) hours as needed for severe pain.    Marland Kitchen oxyCODONE (OXYCONTIN) 10 mg 12 hr tablet Take 10 mg by mouth every 12 (twelve) hours as needed.    Marland Kitchen oxyCODONE (ROXICODONE) 15 MG immediate release tablet Take 15 mg by mouth every 4 (four) hours as needed for pain.    . potassium gluconate (EQL POTASSIUM GLUCONATE) 595 (99 K) MG TABS tablet Take 595 mg by mouth daily.    . Tiotropium Bromide-Olodaterol (STIOLTO RESPIMAT) 2.5-2.5 MCG/ACT AERS Inhale 2 puffs into the lungs daily. 3 Inhaler 3  . vitamin C (ASCORBIC ACID) 500 MG tablet Take 1,000 mg by mouth daily.     . zaleplon (SONATA) 10 MG capsule Take 10 mg by mouth at bedtime.     . [DISCONTINUED] calcium-vitamin D (OSCAL WITH D) 500-200 MG-UNIT per tablet Take 1 tablet by mouth daily.    . [DISCONTINUED] diphenhydrAMINE (SOMINEX) 25 MG tablet Take 50 mg by mouth at bedtime as needed. For sleep     No current facility-administered medications on file prior to visit.     Past Medical History:  Diagnosis Date  . Atrial fibrillation ([place])   . BPH (benign prostatic hyperplasia)   . Bronchitis   . Cataract   . COPD (chronic obstructive pulmonary disease) ([facility])    2 liters O2 HS  . Depression   . Fatty tumor    waste and back  . Fibromyalgia   . GERD (gastroesophageal reflux disease)   . Glaucoma    bilateral eyes  . Gout   . Hereditary and idiopathic peripheral neuropathy 08/28/2015  . Hypertension   . Hypothyroidism   . Hypoxia   . Insomnia   . Memory disorder 08/28/2015  . On home oxygen therapy    at night with cpap  . Osteoarthritis   .  Peripheral neuropathy   . Rotator cuff tear, left   . Sleep apnea    uses cpap-add oxygen at night  . Spinal compression fracture ([facility]) seventh vertebre  . Transient alteration of awareness 08/28/2015  . Type 2 diabetes mellitus ([place]) 03/08/2016  . Wears glasses     Past Surgical History:  Procedure Laterality Date  . APPENDECTOMY  age 82  . CARPAL TUNNEL RELEASE Right  early 2000s  . CATARACT EXTRACTION     bilateral  . CHOLECYSTECTOMY  age 62  . EYE SURGERY    . FOOT ARTHRODESIS Right 02/02/2013   Procedure: RIGHT HALLUX METATARSAL PHALANGEAL JOINT ARTHRODESIS ;  Surgeon: Wylene Simmer, MD;  Location: [facility];  Service: Orthopedics;  Laterality: Right;  . INGUINAL HERNIA REPAIR  age 20   rt side  . NASAL CONCHA BULLOSA RESECTION  age 42  . PROSTATE SURGERY    . SHOULDER ARTHROSCOPY W/ ROTATOR CUFF REPAIR Right    early 2000s  . tonsil    . VASECTOMY  age 54    Social History   Social History  . Marital status: Married    Spouse name: N/A  . Number of children: N/A  . Years of education: N/A   Occupational History  . RETIRED Retired   Social History Main Topics  . Smoking status: Former Smoker    Packs/day: 2.00    Years: 10.00    Types: Cigarettes    Quit date: 07/18/1975  . Smokeless tobacco: Never Used  . Alcohol use Yes     Comment: 1-2 drinks/day  . Drug use: No  . Sexual activity: Not Asked   Other Topics Concern  . None   Social History Narrative  . None    Family History  Problem Relation Age of Onset  . Coronary artery disease Brother   . Heart disease Father   . Lung cancer Father   . Kidney cancer Father   . Prostate cancer Father   . Arthritis Mother   . Lung cancer Mother     Review of Systems  Constitutional: Negative for chills and fever.  Respiratory: Positive for cough (occ, allergy ). Negative for shortness of breath and wheezing.   Cardiovascular: Positive for leg swelling (compression socks daily). Negative  for chest pain and palpitations.  Neurological: Positive for headaches (occ). Negative for light-headedness.       Objective:   Vitals:   12/08/16 1102  BP: (!) 154/76  Pulse: 64  Resp: 16  Temp: 98 F (36.7 C)   Wt Readings from Last 3 Encounters:  12/08/16 212 lb (96.2 kg)  09/07/16 215 lb (97.5 kg)  07/22/16 219 lb 6.4 oz (99.5 kg)   Body mass index is 33.2 kg/m.   Physical Exam    Constitutional: Appears well-developed and well-nourished. No distress.  HENT:  Head: Normocephalic and atraumatic.  Neck: Neck supple. No tracheal deviation present. No thyromegaly present.  No cervical lymphadenopathy Cardiovascular: Normal rate, regular rhythm and normal heart sounds.   No murmur heard. No carotid bruit .  No edema - wearing compression socks Pulmonary/Chest: Effort normal and breath sounds normal. No respiratory distress. No has no wheezes. No rales.  Skin: Skin is warm and dry. Not diaphoretic.  Psychiatric: Normal mood and affect. Behavior is normal.      Assessment & Plan:    See Problem List for Assessment and Plan of chronic medical problems.

## 2016-12-08 NOTE — Assessment & Plan Note (Signed)
GERD controlled Continue daily medication  

## 2016-12-10 ENCOUNTER — Encounter: Payer: Self-pay | Admitting: Internal Medicine

## 2016-12-11 NOTE — Progress Notes (Signed)
Order(s) created erroneously. Erroneous order ID: [id number]  Order moved by: Genia Harold D  Order move date/time: 12/11/2016 10:57 AM  Source Patient: [id number]  Source Contact: 12/08/2016  Destination Patient: [id number]  Destination Contact: 04/15/2014

## 2016-12-15 MED ORDER — ATORVASTATIN CALCIUM 20 MG PO TABS
20.0000 mg | ORAL_TABLET | Freq: Every day | ORAL | 3 refills | Status: DC
Start: 2016-12-15 — End: 2017-10-20

## 2016-12-22 ENCOUNTER — Ambulatory Visit (INDEPENDENT_AMBULATORY_CARE_PROVIDER_SITE_OTHER): Payer: Medicare Other | Admitting: Internal Medicine

## 2016-12-22 ENCOUNTER — Encounter: Payer: Self-pay | Admitting: Internal Medicine

## 2016-12-22 VITALS — BP 132/86 | HR 68 | Ht 67.0 in | Wt 209.0 lb

## 2016-12-22 DIAGNOSIS — R519 Headache, unspecified: Secondary | ICD-10-CM

## 2016-12-22 DIAGNOSIS — I10 Essential (primary) hypertension: Secondary | ICD-10-CM | POA: Diagnosis not present

## 2016-12-22 DIAGNOSIS — R51 Headache: Secondary | ICD-10-CM | POA: Diagnosis not present

## 2016-12-22 DIAGNOSIS — E119 Type 2 diabetes mellitus without complications: Secondary | ICD-10-CM

## 2016-12-22 NOTE — Progress Notes (Signed)
Subjective:    Patient ID: Jonathan [name], male    DOB: 1932-12-09, 83 y.o.   MRN: [id number]  HPI  Here with 2 wks onset left occipital headache, constant moderate heavy pressure like, just not getting gbetter, but without scalp change, swelling, trauma, rash or worsening neck pain.  Points to relatively large area nearly the entire left occiput extending to the crown.  Not clearly neuritic. Pt denies new neurological symptoms such as new headache, or facial or extremity weakness or numbness but has ongoing vision blurriness ? Worse since onset, but no overt scotoma or visual field loss. Pt denies chest pain, increased sob or doe, wheezing, orthopnea, PND, increased LE swelling, palpitations, dizziness or syncope.  Pt has multiple risk factors for vascular disease, and has hx of glaucoma both eyes.  No worsening eye pain or swelling.  No hx of TIA or CVA, or malignancy but does have hx of afib on eliquis Past Medical History:  Diagnosis Date  . Atrial fibrillation ([place])   . BPH (benign prostatic hyperplasia)   . Bronchitis   . Cataract   . COPD (chronic obstructive pulmonary disease) ([facility])    2 liters O2 HS  . Depression   . Fatty tumor    waste and back  . Fibromyalgia   . GERD (gastroesophageal reflux disease)   . Glaucoma    bilateral eyes  . Gout   . Hereditary and idiopathic peripheral neuropathy 08/28/2015  . Hypertension   . Hypothyroidism   . Hypoxia   . Insomnia   . Memory disorder 08/28/2015  . On home oxygen therapy    at night with cpap  . Osteoarthritis   . Peripheral neuropathy   . Rotator cuff tear, left   . Sleep apnea    uses cpap-add oxygen at night  . Spinal compression fracture ([facility]) seventh vertebre  . Transient alteration of awareness 08/28/2015  . Type 2 diabetes mellitus ([place]) 03/08/2016  . Wears glasses    Past Surgical History:  Procedure Laterality Date  . APPENDECTOMY  age 47  . CARPAL TUNNEL RELEASE Right    early 2000s  . CATARACT EXTRACTION      bilateral  . CHOLECYSTECTOMY  age 73  . EYE SURGERY    . FOOT ARTHRODESIS Right 02/02/2013   Procedure: RIGHT HALLUX METATARSAL PHALANGEAL JOINT ARTHRODESIS ;  Surgeon: Wylene Simmer, MD;  Location: [facility];  Service: Orthopedics;  Laterality: Right;  . INGUINAL HERNIA REPAIR  age 72   rt side  . NASAL CONCHA BULLOSA RESECTION  age 48  . PROSTATE SURGERY    . SHOULDER ARTHROSCOPY W/ ROTATOR CUFF REPAIR Right    early 2000s  . tonsil    . VASECTOMY  age 23    reports that he quit smoking about 41 years ago. His smoking use included Cigarettes. He has a 20.00 pack-year smoking history. He has never used smokeless tobacco. He reports that he drinks alcohol. He reports that he does not use drugs. family history includes Arthritis in his mother; Coronary artery disease in his brother; Heart disease in his father; Kidney cancer in his father; Lung cancer in his father and mother; Prostate cancer in his father. No Known Allergies Current Outpatient Prescriptions on File Prior to Visit  Medication Sig Dispense Refill  . acetaminophen (TYLENOL) 500 MG tablet Take 2 tablets (1,000 mg total) by mouth 2 (two) times daily. 360 tablet 1  . alendronate (FOSAMAX) 70 MG tablet Take 70 mg by  mouth every Monday.  4  . allopurinol (ZYLOPRIM) 300 MG tablet Take 300 mg by mouth every evening.     Marland Kitchen apixaban (ELIQUIS) 5 MG TABS tablet Take 1 tablet (5 mg total) by mouth 2 (two) times daily. 180 tablet 1  . atorvastatin (LIPITOR) 20 MG tablet Take 1 tablet (20 mg total) by mouth daily. 90 tablet 3  . Biotin 5000 MCG TABS Take 5,000 mcg by mouth daily.    . brimonidine (ALPHAGAN) 0.15 % ophthalmic solution Place 1 drop into the left eye 2 (two) times daily.     . Calcium Lactate 750 MG TABS Take 1 tablet by mouth daily.     . Cholecalciferol (VITAMIN D3 PO) Take 1,000 mg by mouth daily.    . colchicine (COLCRYS) 0.6 MG tablet Take 0.6 mg by mouth daily as needed (flareups).     . diltiazem  (CARDIZEM CD) 180 MG 24 hr capsule Take 1 capsule (180 mg total) by mouth daily. 90 capsule 3  . doxazosin (CARDURA) 8 MG tablet Take 1 tablet (8 mg total) by mouth daily. PATIENT NEEDS OFFICE VISIT FOR ADDITIONAL REFILLS 30 tablet 0  . DULoxetine (CYMBALTA) 30 MG capsule Take 60 mg by mouth daily.     Marland Kitchen latanoprost (XALATAN) 0.005 % ophthalmic solution Place 1 drop into both eyes at bedtime. 7.5 mL 3  . levalbuterol (XOPENEX HFA) 45 MCG/ACT inhaler Inhale 1-2 puffs into the lungs every 4 (four) hours as needed for wheezing. 1 Inhaler 12  . levothyroxine (SYNTHROID, LEVOTHROID) 88 MCG tablet Take 1 tablet (88 mcg total) by mouth daily. 90 tablet 1  . Lifitegrast (XIIDRA) 5 % SOLN Apply to eye.    . loratadine (CLARITIN) 10 MG tablet Take 10 mg by mouth daily.    . magnesium oxide (MAG-OX) 400 MG tablet Take 400 mg by mouth daily.    . Melatonin 5 MG TABS Take 5 mg by mouth at bedtime.    . mirabegron ER (MYRBETRIQ) 25 MG TB24 tablet Take 25 mg by mouth daily.    . mometasone (NASONEX) 50 MCG/ACT nasal spray Place 2 sprays into the nose daily.    . Multiple Vitamin (MULTIVITAMIN) tablet Take 1 tablet by mouth daily.    . nitroGLYCERIN (NITROSTAT) 0.4 MG SL tablet Place 1 tablet (0.4 mg total) under the tongue every 5 (five) minutes as needed for chest pain. 90 tablet 3  . NON FORMULARY Inhale 2 L into the lungs at bedtime. CPAP with O2 2L    . omeprazole (PRILOSEC) 20 MG capsule Take 20 mg by mouth daily.    Marland Kitchen oxyCODONE (OXY IR/ROXICODONE) 5 MG immediate release tablet Take 5 mg by mouth every 4 (four) hours as needed for severe pain.    Marland Kitchen oxyCODONE (OXYCONTIN) 10 mg 12 hr tablet Take 10 mg by mouth every 12 (twelve) hours as needed.    Marland Kitchen oxyCODONE (ROXICODONE) 15 MG immediate release tablet Take 15 mg by mouth every 4 (four) hours as needed for pain.    . potassium gluconate (EQL POTASSIUM GLUCONATE) 595 (99 K) MG TABS tablet Take 595 mg by mouth daily.    . pregabalin (LYRICA) 100 MG capsule  Take 1 tablet in AM, Take 2 tablet in PM 270 capsule 0  . tadalafil (CIALIS) 20 MG tablet Take 1 tablet (20 mg total) by mouth daily as needed. For erectile dysfunction 10 tablet 1  . Tiotropium Bromide-Olodaterol (STIOLTO RESPIMAT) 2.5-2.5 MCG/ACT AERS Inhale 2 puffs into the lungs daily.  3 Inhaler 3  . tiZANidine (ZANAFLEX) 4 MG capsule Take 1 capsule (4 mg total) by mouth at bedtime. 30 capsule 5  . vitamin C (ASCORBIC ACID) 500 MG tablet Take 1,000 mg by mouth daily.     . zaleplon (SONATA) 10 MG capsule Take 10 mg by mouth at bedtime.     . hydrochlorothiazide (HYDRODIURIL) 25 MG tablet Take 1 tablet (25 mg total) by mouth daily. 90 tablet 3  . [DISCONTINUED] calcium-vitamin D (OSCAL WITH D) 500-200 MG-UNIT per tablet Take 1 tablet by mouth daily.    . [DISCONTINUED] diphenhydrAMINE (SOMINEX) 25 MG tablet Take 50 mg by mouth at bedtime as needed. For sleep     No current facility-administered medications on file prior to visit.    Review of Systems  Constitutional: Negative for other unusual diaphoresis or sweats HENT: Negative for ear discharge or swelling Eyes: Negative for other worsening visual disturbances Respiratory: Negative for stridor or other swelling  Gastrointestinal: Negative for worsening distension or other blood Genitourinary: Negative for retention or other urinary change Musculoskeletal: Negative for other MSK pain or swelling Skin: Negative for color change or other new lesions Neurological: Negative for worsening tremors and other numbness  Psychiatric/Behavioral: Negative for worsening agitation or other fatigue All other system neg per pt    Objective:   Physical Exam BP 132/86   Pulse 68   Ht 5\' 7"  (1.702 m)   Wt 209 lb (94.8 kg)   SpO2 98%   BMI 32.73 kg/m  VS noted, not ill appearing Constitutional: Pt appears in NAD HENT: Head: NCAT.  Right Ear: External ear normal.  Left Ear: External ear normal.  Eyes: . Pupils are equal, round, and reactive to  light. Conjunctivae and EOM are normal Nose: without d/c or deformity Neck: Neck supple. Gross normal ROM Cardiovascular: Normal rate and regular rhythm.   Pulmonary/Chest: Effort normal and breath sounds without rales or wheezing.  Neurological: Pt is alert. At baseline orientation, motor 5/5 intact, cn 2-12 intact including visual fields, wears corrective lenses, sens intact to head and UE's Skin: Skin is warm. No rashes, other new lesions, no LE edema Psychiatric: Pt behavior is normal without agitation  No other exam findings  Lab Results  Component Value Date   WBC 4.8 09/02/2016   HGB 13.8 09/02/2016   HCT 41.9 09/02/2016   PLT 128 (L) 09/02/2016   GLUCOSE 131 (H) 12/08/2016   CHOL 195 12/08/2016   TRIG 103.0 12/08/2016   HDL 54.30 12/08/2016   LDLCALC 120 (H) 12/08/2016   ALT 22 12/08/2016   AST 17 12/08/2016   NA 141 12/08/2016   K 4.4 12/08/2016   CL 102 12/08/2016   CREATININE 1.23 12/08/2016   BUN 27 (H) 12/08/2016   CO2 31 12/08/2016   TSH 2.70 12/08/2016   INR 1.00 07/20/2011   HGBA1C 6.8 (H) 12/08/2016   MICROALBUR 21.2 (H) 12/08/2016       Assessment & Plan:

## 2016-12-22 NOTE — Assessment & Plan Note (Signed)
Atypical HA, etiology unclear, exam benign but hx is worrisome for possible stroke and has multiple risk factors; I note he has had good compliance with eliquis and other meds.  Will plan for asap Head MRI, with further recommendations pending results.  To f/u with PCP .

## 2016-12-22 NOTE — Assessment & Plan Note (Signed)
stable overall by history and exam, recent data reviewed with pt, and pt to continue medical treatment as before,  to f/u any worsening symptoms or concerns BP Readings from Last 3 Encounters:  12/22/16 132/86  12/08/16 (!) 154/76  09/07/16 (!) 162/70

## 2016-12-22 NOTE — Assessment & Plan Note (Signed)
stable overall by history and exam, recent data reviewed with pt, and pt to continue medical treatment as before,  to f/u any worsening symptoms or concerns  

## 2016-12-22 NOTE — Patient Instructions (Signed)
Please continue all other medications as before, and refills have been done if requested.  Please have the pharmacy call with any other refills you may need.  Please keep your appointments with your specialists as you may have planned  You will be contacted regarding the referral for: MRI head

## 2016-12-23 ENCOUNTER — Ambulatory Visit (HOSPITAL_COMMUNITY)
Admission: RE | Admit: 2016-12-23 | Discharge: 2016-12-23 | Disposition: A | Discharge status: Home / Self Care | Payer: Medicare Other | Source: Ambulatory Visit | Attending: Internal Medicine | Admitting: Internal Medicine

## 2016-12-23 DIAGNOSIS — R51 Headache: Secondary | ICD-10-CM | POA: Diagnosis not present

## 2016-12-23 DIAGNOSIS — R519 Headache, unspecified: Secondary | ICD-10-CM

## 2017-01-10 NOTE — Progress Notes (Signed)
NEUROPSYCHOLOGICAL EVALUATION   Name:    Jonathan [name]  Date of Birth:   Apr 23, 1933 Date of Interview:  10/22/2016 Date of Testing:  10/22/2016   Date of Feedback:  01/11/2017       Background Information:  Reason for Referral:  Jonathan [name] is a 83 y.o. male referred by Dr. Billey Gosling of [facility] to assess his current level of cognitive functioning and assist in differential diagnosis. The current evaluation consisted of a review of available medical records, an interview with the patient and his wife, and the completion of a neuropsychological testing battery. Informed consent was obtained.  History of Presenting Problem:  Jonathan [name] reported a history of memory decline with superimposed discreet episodes of intermittent cognitive clouding. He does have a history of head injury secondary to a fall from about 8 feet up; this occurred in 2002. He did not have LOC but was dazed afterwards. He thinks it has been since that fall that he has episodes of "thick fog and can't work through it", but other times he can feel fairly or completely "normal". He reported frequent difficulty with short term memory. His wife will ask him to do something and he won't remember it at all or until much later. He forgets other information she tells him. Also, they were recently on a trip, and even on the sixth day of being in the same hotel he could not recall the name of the hotel. The patient also reported some long term memory loss as well, including inability to recall some events as much as 20 years ago that his wife recalls very well, such as trips they took. Upon direct questioning, his wife states that he does repeat questions but not all the time. He misplaces items somewhat more frequently. He endorses difficulty concentrating. He has always been one to start multiple tasks before finishing each one; his wife says he has always been "sort of ADD". He also endorses slowed processing speed and  word finding difficulty. His wife has noted that he has more difficulty reading aloud. He has some comprehension difficulty in conversation but this could be because of hearing loss. He does have hearing aids but he doesn't feel they help. His wife notes that he sometimes seems to have a hard time understanding "the crux" of something - his mind will go to a detail and focus on that instead of the bigger picture. He has always had this issue but she thinks it has gotten more frequent. The patient states that his sense of direction is still good, but his wife reports that he is more confused about where he is, especially if in a newer place. He can't seem to get [name], [place], straight in his mind although they visit their daughter there regularly. His wife has noticed he has more uncertainty about directions when driving outside of their area, and this is a big change related to how he used to be.  MRI of the brain on 03/05/2016 reportedly revealed the following:  1. Mild generalized atrophy. 2. Mild periventricular and subcortical small vessel ischemic disease. 3. No acute findings.  Family history is reportedly significant for severe dementia in his late mother. The patient and his wife worry that he may have dementia as well.  Current Functioning: The patient lives with his wife in their own home. He drives and has not had any MVAs that were his fault. He manages his medications independently. He has forgotten to take  them a little bit more frequently but it is not a regular occurrence. He manages some of the finances. Paperwork has always been a challenge for him; he procrastinates but does not miss bill payments. He has trouble tracking and remembering his appointments. He uses the calendar function on his phone but he forgets to check it. He has missed quite a few appointments. He is able to cook if he needs to.   Physically, the patient reported that he is in an enormous amount of pain  which he has had for years, secondary to fibromyalgia, arthritis, and peripheral neuropathy. He takes opioids rarely. His wife notes that how he feels fluctuates a lot. He can go through a phase of feeling really bad for a while but then be better for a while. How he feels is not constant or predictable. He does complain of being tired a lot. He wonders if this is secondary to his medications. He wears a CPAP with supplemental nocturnal oxygen. He does not always wear his CPAP for naps.  He has some difficulty with balance. He has not had any falls recently. He noted that he will hit his left shoulder on a wall or door frame when walking. This has happened for years but has never caused any serious problem.   He sleeps well, 8-10 hours a night. He has a history of insomnia but hasn't had much trouble falling asleep lately. He takes 0-2 naps per day.   He almost never has an appetite. His mind will get foggy so he knows his blood sugar must be low and will eat something. He does not check his blood sugars at home. He has no history of hypo or hyperglycemic episode requiring medical attention.  He denied history of hallucinations or psychosis.   Psychiatric History: He describes his mood as pretty good. He does have a long history of depression and has been very close to suicide in the past. He denied any recent suicidal ideation/intention. He reported reduced motivation but not depressed mood. His wife states he can get angry fairly easily and "doesn't always handle it so well". He does not get physically aggressive though. He endorses a history of anxiety with a tendency to catastrophize. He reported that he has had "tons" of counseling, "most of it extremely ineffective". He has never been hospitalized psychiatrically. He denied history of substance abuse or dependence.    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 2 children and 2 stepchildren. 6 grands together. Alcohol/Tobacco/Substances: Less than daily alcohol - but drink when go out for a meal and before going to bed and want to sleep. Typically double [employer]. Quit smoking 1977. No SA.    Medical History:  Past Medical History:  Diagnosis Date  . Atrial fibrillation ([place])   . BPH (benign prostatic hyperplasia)   . Bronchitis   . Cataract   . COPD (chronic obstructive pulmonary disease) (HCC)    2 liters O2 HS  . Depression   . Fatty tumor    waste and back  . Fibromyalgia   . GERD (gastroesophageal reflux disease)   . Glaucoma    bilateral eyes  . Gout   . Hereditary and idiopathic peripheral neuropathy 08/28/2015  . Hypertension   . Hypothyroidism   . Hypoxia   . Insomnia   . Memory disorder 08/28/2015  .  On home oxygen therapy    at night with cpap  . Osteoarthritis   . Peripheral neuropathy   . Rotator cuff tear, left   . Sleep apnea    uses cpap-add oxygen at night  . Spinal compression fracture (HCC) seventh vertebre  . Transient alteration of awareness 08/28/2015  . Type 2 diabetes mellitus ([place]) 03/08/2016  . Wears glasses     Current medications:  Outpatient Encounter Prescriptions as of 01/11/2017  Medication Sig  . acetaminophen (TYLENOL) 500 MG tablet Take 2 tablets (1,000 mg total) by mouth 2 (two) times daily.  Marland Kitchen alendronate (FOSAMAX) 70 MG tablet Take 70 mg by mouth every Monday.  Marland Kitchen allopurinol (ZYLOPRIM) 300 MG tablet Take 300 mg by mouth every evening.   Marland Kitchen apixaban (ELIQUIS) 5 MG TABS tablet Take 1 tablet (5 mg total) by mouth 2 (two) times daily.  Marland Kitchen atorvastatin (LIPITOR) 20 MG tablet Take 1 tablet (20 mg total) by mouth daily.  . Biotin 5000 MCG TABS Take 5,000 mcg by mouth daily.  . brimonidine (ALPHAGAN) 0.15 % ophthalmic solution Place 1 drop into the left eye 2 (two) times daily.   . Calcium  Lactate 750 MG TABS Take 1 tablet by mouth daily.   . Cholecalciferol (VITAMIN D3 PO) Take 1,000 mg by mouth daily.  . colchicine (COLCRYS) 0.6 MG tablet Take 0.6 mg by mouth daily as needed (flareups).   . diltiazem (CARDIZEM CD) 180 MG 24 hr capsule Take 1 capsule (180 mg total) by mouth daily.  Marland Kitchen doxazosin (CARDURA) 8 MG tablet Take 1 tablet (8 mg total) by mouth daily. PATIENT NEEDS OFFICE VISIT FOR ADDITIONAL REFILLS  . DULoxetine (CYMBALTA) 30 MG capsule Take 60 mg by mouth daily.   . hydrochlorothiazide (HYDRODIURIL) 25 MG tablet Take 1 tablet (25 mg total) by mouth daily.  Marland Kitchen latanoprost (XALATAN) 0.005 % ophthalmic solution Place 1 drop into both eyes at bedtime.  . levalbuterol (XOPENEX HFA) 45 MCG/ACT inhaler Inhale 1-2 puffs into the lungs every 4 (four) hours as needed for wheezing.  Marland Kitchen levothyroxine (SYNTHROID, LEVOTHROID) 88 MCG tablet Take 1 tablet (88 mcg total) by mouth daily.  Marland Kitchen Lifitegrast (XIIDRA) 5 % SOLN Apply to eye.  . loratadine (CLARITIN) 10 MG tablet Take 10 mg by mouth daily.  . magnesium oxide (MAG-OX) 400 MG tablet Take 400 mg by mouth daily.  . Melatonin 5 MG TABS Take 5 mg by mouth at bedtime.  . mirabegron ER (MYRBETRIQ) 25 MG TB24 tablet Take 25 mg by mouth daily.  . mometasone (NASONEX) 50 MCG/ACT nasal spray Place 2 sprays into the nose daily.  . Multiple Vitamin (MULTIVITAMIN) tablet Take 1 tablet by mouth daily.  . nitroGLYCERIN (NITROSTAT) 0.4 MG SL tablet Place 1 tablet (0.4 mg total) under the tongue every 5 (five) minutes as needed for chest pain.  . NON FORMULARY Inhale 2 L into the lungs at bedtime. CPAP with O2 2L  . omeprazole (PRILOSEC) 20 MG capsule Take 20 mg by mouth daily.  Marland Kitchen oxyCODONE (OXY IR/ROXICODONE) 5 MG immediate release tablet Take 5 mg by mouth every 4 (four) hours as needed for severe pain.  Marland Kitchen oxyCODONE (OXYCONTIN) 10 mg 12 hr tablet Take 10 mg by mouth every 12 (twelve) hours as needed.  Marland Kitchen oxyCODONE (ROXICODONE) 15 MG immediate  release tablet Take 15 mg by mouth every 4 (four) hours as needed for pain.  . potassium gluconate (EQL POTASSIUM GLUCONATE) 595 (99 K) MG TABS tablet Take 595 mg by  mouth daily.  . pregabalin (LYRICA) 100 MG capsule Take 1 tablet in AM, Take 2 tablet in PM  . tadalafil (CIALIS) 20 MG tablet Take 1 tablet (20 mg total) by mouth daily as needed. For erectile dysfunction  . Tiotropium Bromide-Olodaterol (STIOLTO RESPIMAT) 2.5-2.5 MCG/ACT AERS Inhale 2 puffs into the lungs daily.  Marland Kitchen tiZANidine (ZANAFLEX) 4 MG capsule Take 1 capsule (4 mg total) by mouth at bedtime.  . vitamin C (ASCORBIC ACID) 500 MG tablet Take 1,000 mg by mouth daily.   . zaleplon (SONATA) 10 MG capsule Take 10 mg by mouth at bedtime.    No facility-administered encounter medications on file as of 01/11/2017.      Current Examination:  Behavioral Observations:   Appearance: Neatly and appropriately dressed and groomed, appearing younger than his chronological age Gait: Ambulated independently, no gross abnormalities observed Speech: Fluent; normal rate, rhythm and volume Thought process: Linear, goal directed Affect: Full, euthymic Interpersonal: Pleasant, appropriate Orientation: Oriented to person, place and most aspects of time (one day off on the date). Accurately named the current President but could not recall his predecessor.  Tests Administered: . Test of Premorbid Functioning (TOPF) . Wechsler Adult Intelligence Scale-Fourth Edition (WAIS-IV): Similarities, Music therapist, Coding and Digit Span subtests . Wechsler Memory Scale-Fourth Edition (WMS-IV) Older Adult Version (ages 14-90): Logical Memory I, II and Recognition subtests  . [employer] Verbal Learning Test - 2nd Edition (CVLT-2) Short Form . Repeatable Battery for the Assessment of Neuropsychological Status (RBANS) Form A:  Figure Copy and Recall subtests and Semantic Fluency subtest . Neuropsychological Assessment Battery (NAB) Language Module, Form 1:  Naming subtest . [place] Diagnostic Aphasia Examination: Complex Ideational Material subtest . Controlled Oral Word Association Test (COWAT) . Trail Making Test A and B . Clock drawing test . Geriatric Depression Scale (GDS) 15 Item . Generalized Anxiety Disorder - 7 item screener (GAD-7)  Test Results: Note: Standardized scores are presented only for use by appropriately trained professionals and to allow for any future test-retest comparison. These scores should not be interpreted without consideration of all the information that is contained in the rest of the report. The most recent standardization samples from the test publisher or other sources were used whenever possible to derive standard scores; scores were corrected for age, gender, ethnicity and education when available.   Test Scores:  Test Name Raw Score Standardized Score Descriptor  TOPF 48/70 SS= 106 Average  WAIS-IV Subtests     Similarities 29/36 ss= 14 Superior  Block Design 52/66 ss= 17 Very superior  Coding 32/135 ss= 8 Average  Digit Span Forward 10/16 ss= 11 Average  Digit Span Backward 8/16 ss= 11 Average  WMS-IV Subtests     LM I 36/53 ss= 13 High average  LM II 16/39 ss= 11 Average  LM II Recognition 18/23 Cum %: 51-75 WNL  RBANS Subtests     Figure Copy 20/20 Z= 1.4 Superior  Figure Recall 18/20 Z= 1.6 Superior  Semantic Fluency 15/40 Z= -0.7 Average  CVLT-II Scores     Trial 1 5/9 Z= 0.5 Average  Trial 4 7/9 Z= 0.5 Average  Trials 1-4 total 25/36 T= 61 High average  SD Free Recall 7/9 Z= 1.5 Superior  LD Free Recall 6/9 Z= 1 High average  LD Cued Recall 7/9 Z= 1.5 Superior  Recognition Discriminability 8/9 hits, 0 false positives Z= 1 High average  Forced Choice Recognition 9/9  WNL  NAB Language subtest     Naming 31/31 T= 64  Superior  BDAE Subtest     Complex Ideational Material 12/12  WNL  COWAT-FAS 23 T= 38 Low average  COWAT-Animals 13 T= 42 Low average  Trail Making Test A  27" 0 errors  T= 63 High average  Trail Making Test B  92" 0 errors T= 57 High average  Clock Drawing   WNL  GDS-15 4/15  WNL  GAD-7 1/21  WNL      Description of Test Results:  Premorbid verbal intellectual abilities were estimated to have been within at least the average range based on a test of word reading. Psychomotor processing speed was average to high average. Auditory attention and working memory were average. Visual-spatial construction was very superior. Language abilities were intact. Specifically, confrontation naming was superior, and semantic verbal fluency was average. Auditory comprehension of complex ideational material was intact. With regard to verbal memory, encoding and acquisition of non-contextual information (i.e., word list) was high average. After a brief distracter task, free recall was superior (7/9 items recalled). After a delay, free recall was high average (6/9 items recalled). Cued recall was superior (7/9 items recalled). Performance on a yes/no recognition task was high average. On another verbal memory test, encoding and acquisition of contextual auditory information (i.e., short stories) was high average. After a delay, free recall was average. Performance on a yes/no recognition task was intact. With regard to non-verbal memory, delayed free recall of visual information was superior. Executive functioning was intact overall. Mental flexibility and set-shifting were high average on Trails B. Verbal fluency with phonemic search restrictions was low average. Verbal abstract reasoning was superior. Performance on a clock drawing task was intact. On self-report measures of mood, the patient's responses were not indicative of clinically significant depression or anxiety at the present time.    Clinical Impressions: Diagnosis deferred. No evidence of dementia or other neurocognitive disorder. Results of cognitive testing were entirely within normal limits and commensurate with  estimated premorbid intellectual abilities. Specifically, processing speed, auditory attention, language, visual spatial skills, learning and memory, and executive functioning were all normal, with most scores falling in the high average to superior range. There were no impaired performances on testing. There is no evidence to suggest the presence of a neurocognitive disorder, including MCI or dementia, at this time. There is also no evidence of a primary psychiatric disorder. It is most likely that the patient's subjective cognitive complaints are secondary to normal aging, variable blood sugars (e.g., decreased cognitive function in hypoglycemic episodes), and/or episodes of increased pain.   Recommendations/Plan: Based on the findings of the present evaluation, the following recommendations are offered:  --The patient was reassured that his testing results are entirely within normal limits (with most areas well above average for his age) and not indicative of any underlying cognitive disorder or dementia. I reviewed strategies to maintain brain health, including cardiovascular exercise, mental stimulation and social engagement.  He was also encouraged to regularly check his blood sugars and work to keep optimal control of them. Additionally he was encouraged to hydrate more. --The impact of secondary factors such as hypoglycemia, mood and chronic pain on cognitive functioning were reviewed.  --These results will serve as a nice baseline for future comparison if ever needed.  --He will follow up with Dr. Quay Burow regarding possible causes of his significant fatigue. --He will also follow up with Dr. Quay Burow about a possible pharmacology consult to review his extensive medication list and determine if any changes are indicated.   Feedback to  Patient: Dakotah Orrego and his wife returned for a feedback appointment on 01/11/2017 to review the results of his neuropsychological evaluation with this provider.  30 minutes face-to-face time was spent reviewing his test results, my impressions and my recommendations as detailed above.   A copy of this report was mailed to the patient per his request.  Total time spent on this patient's case: [id number] unit for interview with psychologist; [phone] units of testing by psychometrician under psychologist's supervision; [phone] units for medical record review, scoring of neuropsychological tests, interpretation of test results, preparation of this report, and review of results to the patient by psychologist.      Thank you for your referral of Efe Fazzino. Please feel free to contact me if you have any questions or concerns regarding this report.

## 2017-01-11 ENCOUNTER — Encounter: Payer: Self-pay | Admitting: Psychology

## 2017-01-11 ENCOUNTER — Ambulatory Visit (INDEPENDENT_AMBULATORY_CARE_PROVIDER_SITE_OTHER): Payer: Medicare Other | Admitting: Psychology

## 2017-01-11 DIAGNOSIS — R4189 Other symptoms and signs involving cognitive functions and awareness: Secondary | ICD-10-CM

## 2017-01-11 NOTE — Patient Instructions (Addendum)
Neurocognitive testing results were entirely within normal limits (with most areas well above average for your age) and not indicative of any underlying cognitive disorder or dementia.  Hypoglycemia (episodes of low blood sugar), low mood and chronic pain can affect cognitive functioning in daily life and may be what is causing some of the intermittent cognitive clouding. I don't think your history of concussion in 2002 is causing any cognitive impairment.   Strategies to maintain brain health include cardiovascular exercise, mental stimulation and social engagement.    The results from this evaluation will serve as a nice baseline for future comparison if ever needed.

## 2017-01-12 ENCOUNTER — Ambulatory Visit (INDEPENDENT_AMBULATORY_CARE_PROVIDER_SITE_OTHER): Payer: Medicare Other | Admitting: Family Medicine

## 2017-01-12 ENCOUNTER — Encounter: Payer: Self-pay | Admitting: Family Medicine

## 2017-01-12 VITALS — BP 132/74 | HR 70 | Temp 98.1°F | Resp 17 | Ht 67.5 in | Wt 214.0 lb

## 2017-01-12 DIAGNOSIS — Z8614 Personal history of Methicillin resistant Staphylococcus aureus infection: Secondary | ICD-10-CM

## 2017-01-12 DIAGNOSIS — R040 Epistaxis: Secondary | ICD-10-CM | POA: Diagnosis not present

## 2017-01-12 DIAGNOSIS — L03311 Cellulitis of abdominal wall: Secondary | ICD-10-CM | POA: Diagnosis not present

## 2017-01-12 MED ORDER — MUPIROCIN CALCIUM 2 % EX CREA: 1.0000 "application " | TOPICAL_CREAM | Freq: Two times a day (BID) | CUTANEOUS | 0 refills | Status: DC

## 2017-01-12 MED ORDER — DOXYCYCLINE HYCLATE 100 MG PO TABS: 100.0000 mg | ORAL_TABLET | Freq: Two times a day (BID) | ORAL | 0 refills | Status: DC

## 2017-01-12 MED ORDER — CEPHALEXIN 500 MG PO CAPS: 500.0000 mg | ORAL_CAPSULE | Freq: Two times a day (BID) | ORAL | 0 refills | Status: DC

## 2017-01-12 NOTE — Progress Notes (Signed)
Subjective:  This chart was scribed for Wendie Agreste, MD by Tamsen Roers, at [place] at [facility].  This patient was seen in room 12 and the patient's care was started at 12:15 PM.   Chief Complaint  Patient presents with  . infected wound on stomach     Patient ID: Jonathan [name], male    DOB: 1933/01/14, 83 y.o.   MRN: [id number]  HPI HPI Comments: Jonathan [name] is a 84 y.o. male who presents to Primary Care at [facility] complaining of an infected wound on his stomach which he noticed about 3 days ago.  He is a new patient to me. Patient had a blood blister (caused by working on his car) which he popped about 5 days ago which led to an infection. He has been using an antibiotic ointment (Bactroban- couple of times per day) and taking doxycycline- twice per day, 100 mg started two days ago (an old prescription from about 3 years ago).  Patient denies any fevers/chills. Patient has had MRSA and cellulitis in the past which led him to be hospitalized for 7 days. Per records, he had a MRSA culture from a penile wound that was sensitive to both doxy and septra.   He was given doxycycline at that time as well.  Patient is currently on blood thinners (which he has been using for a "long time").  Patient is also complaining of "bowing out blood" from his nose.  He blows his nose "quite frequently" and has chronic sinus issues.  He thinks it may be caused by the nasal spray (Nasonex- two sprays each side) that he uses.  Has had nosebleeds in the past, and is on chronic anticoagulation. Not currently bleeding. Has not been using saline nasal spray frequently recently.   Binnie Rail, MD- PCP   Patient Active Problem List   Diagnosis Date Noted  . Unilateral occipital headache 12/22/2016  . Obesity (BMI 30.0-34.9) 12/08/2016  . Depression 09/07/2016  . Melena 03/26/2016  . Hypothyroidism 03/08/2016  . GERD (gastroesophageal reflux disease) 03/08/2016  . Gout 03/08/2016  .  Glaucoma 03/08/2016  . Type 2 diabetes mellitus ([place]) 03/08/2016  . Hereditary and idiopathic peripheral neuropathy 08/28/2015  . Memory disorder 08/28/2015  . Fatigue 01/08/2015  . Bruit 08/15/2013  . Hypertension 12/04/2011  . Allergic rhinitis, seasonal 12/04/2011  . Sleep apnea, obstructive 08/25/2011  . Atrial fibrillation with RVR ([place]) 07/20/2011  . CKD (chronic kidney disease) 07/20/2011  . COPD (chronic obstructive pulmonary disease) ([place]) 07/18/2011   Past Medical History:  Diagnosis Date  . Atrial fibrillation ([place])   . BPH (benign prostatic hyperplasia)   . Bronchitis   . Cataract   . COPD (chronic obstructive pulmonary disease) (HCC)    2 liters O2 HS  . Depression   . Fatty tumor    waste and back  . Fibromyalgia   . GERD (gastroesophageal reflux disease)   . Glaucoma    bilateral eyes  . Gout   . Hereditary and idiopathic peripheral neuropathy 08/28/2015  . Hypertension   . Hypothyroidism   . Hypoxia   . Insomnia   . Memory disorder 08/28/2015  . On home oxygen therapy    at night with cpap  . Osteoarthritis   . Peripheral neuropathy   . Rotator cuff tear, left   . Sleep apnea    uses cpap-add oxygen at night  . Spinal compression fracture (HCC) seventh vertebre  . Transient alteration of awareness 08/28/2015  . Type  2 diabetes mellitus ([place]) 03/08/2016  . Wears glasses    Past Surgical History:  Procedure Laterality Date  . APPENDECTOMY  age 81  . CARPAL TUNNEL RELEASE Right    early 2000s  . CATARACT EXTRACTION     bilateral  . CHOLECYSTECTOMY  age 27  . EYE SURGERY    . FOOT ARTHRODESIS Right 02/02/2013   Procedure: RIGHT HALLUX METATARSAL PHALANGEAL JOINT ARTHRODESIS ;  Surgeon: Wylene Simmer, MD;  Location: [facility];  Service: Orthopedics;  Laterality: Right;  . INGUINAL HERNIA REPAIR  age 17   rt side  . NASAL CONCHA BULLOSA RESECTION  age 50  . PROSTATE SURGERY    . SHOULDER ARTHROSCOPY W/ ROTATOR CUFF REPAIR Right     early 2000s  . tonsil    . VASECTOMY  age 66   No Known Allergies Prior to Admission medications   Medication Sig Start Date End Date Taking? Authorizing Provider  acetaminophen (TYLENOL) 500 MG tablet Take 2 tablets (1,000 mg total) by mouth 2 (two) times daily. 11/23/12  Yes Posey Boyer, MD  alendronate (FOSAMAX) 70 MG tablet Take 70 mg by mouth every Monday. 01/09/16  Yes [provider]  allopurinol (ZYLOPRIM) 300 MG tablet Take 300 mg by mouth every evening.  10/31/14  Yes [provider]  apixaban (ELIQUIS) 5 MG TABS tablet Take 1 tablet (5 mg total) by mouth 2 (two) times daily. 11/16/16  Yes Lelon Perla, MD  atorvastatin (LIPITOR) 20 MG tablet Take 1 tablet (20 mg total) by mouth daily. 12/15/16  Yes Burns, Claudina Lick, MD  Biotin 5000 MCG TABS Take 5,000 mcg by mouth daily.   Yes [provider]  brimonidine (ALPHAGAN) 0.15 % ophthalmic solution Place 1 drop into the left eye 2 (two) times daily.  12/06/14  Yes [provider]  Calcium Lactate 750 MG TABS Take 1 tablet by mouth daily.    Yes [provider]  Cholecalciferol (VITAMIN D3 PO) Take 1,000 mg by mouth daily.   Yes [provider]  colchicine (COLCRYS) 0.6 MG tablet Take 0.6 mg by mouth daily as needed (flareups).    Yes [provider]  diltiazem (CARDIZEM CD) 180 MG 24 hr capsule Take 1 capsule (180 mg total) by mouth daily. 09/14/16  Yes Burns, Claudina Lick, MD  doxazosin (CARDURA) 8 MG tablet Take 1 tablet (8 mg total) by mouth daily. PATIENT NEEDS OFFICE VISIT FOR ADDITIONAL REFILLS 07/15/13  Yes Posey Boyer, MD  DULoxetine (CYMBALTA) 30 MG capsule Take 60 mg by mouth daily.    Yes [provider]  latanoprost (XALATAN) 0.005 % ophthalmic solution Place 1 drop into both eyes at bedtime. 05/02/12  Yes Posey Boyer, MD  levalbuterol [facility] HFA) 45 MCG/ACT inhaler Inhale 1-2 puffs into the lungs every 4 (four) hours as needed for wheezing. 07/06/13  Yes  Byrum, Rose Fillers, MD  levothyroxine (SYNTHROID, LEVOTHROID) 88 MCG tablet Take 1 tablet (88 mcg total) by mouth daily. 09/14/16  Yes Burns, Claudina Lick, MD  Lifitegrast [facility]) 5 % SOLN Apply to eye.   Yes [provider]  loratadine (CLARITIN) 10 MG tablet Take 10 mg by mouth daily.   Yes [provider]  magnesium oxide (MAG-OX) 400 MG tablet Take 400 mg by mouth daily.   Yes [provider]  mirabegron ER (MYRBETRIQ) 25 MG TB24 tablet Take 25 mg by mouth daily.   Yes [provider]  mometasone (NASONEX) 50 MCG/ACT nasal  spray Place 2 sprays into the nose daily. 12/06/14  Yes [provider]  Multiple Vitamin (MULTIVITAMIN) tablet Take 1 tablet by mouth daily.   Yes [provider]  nitroGLYCERIN (NITROSTAT) 0.4 MG SL tablet Place 1 tablet (0.4 mg total) under the tongue every 5 (five) minutes as needed for chest pain. 12/13/14  Yes Barrett, Evelene Croon, PA-C  NON FORMULARY Inhale 2 L into the lungs at bedtime. CPAP with O2 2L   Yes [provider]  omeprazole (PRILOSEC) 20 MG capsule Take 20 mg by mouth daily.   Yes [provider]  oxyCODONE (OXY IR/ROXICODONE) 5 MG immediate release tablet Take 5 mg by mouth every 4 (four) hours as needed for severe pain.   Yes [provider]  oxyCODONE (OXYCONTIN) 10 mg 12 hr tablet Take 10 mg by mouth every 12 (twelve) hours as needed.   Yes [provider]  oxyCODONE (ROXICODONE) 15 MG immediate release tablet Take 15 mg by mouth every 4 (four) hours as needed for pain.   Yes [provider]  potassium gluconate (EQL POTASSIUM GLUCONATE) 595 (99 K) MG TABS tablet Take 595 mg by mouth daily.   Yes [provider]  pregabalin (LYRICA) 100 MG capsule Take 1 tablet in AM, Take 2 tablet in PM 12/08/16  Yes Burns, Claudina Lick, MD  tadalafil (CIALIS) 20 MG tablet Take 1 tablet (20 mg total) by mouth daily as needed. For erectile dysfunction 12/08/16  Yes Burns, Claudina Lick, MD    Tiotropium Bromide-Olodaterol (STIOLTO RESPIMAT) 2.5-2.5 MCG/ACT AERS Inhale 2 puffs into the lungs daily. 11/27/16  Yes Collene Gobble, MD  tiZANidine (ZANAFLEX) 4 MG capsule Take 1 capsule (4 mg total) by mouth at bedtime. 12/08/16  Yes Burns, Claudina Lick, MD  vitamin C (ASCORBIC ACID) 500 MG tablet Take 1,000 mg by mouth daily.    Yes [provider]  zaleplon (SONATA) 10 MG capsule Take 10 mg by mouth at bedtime.    Yes [provider]  hydrochlorothiazide (HYDRODIURIL) 25 MG tablet Take 1 tablet (25 mg total) by mouth daily. 09/16/16 12/15/16  Lelon Perla, MD  Melatonin 5 MG TABS Take 5 mg by mouth at bedtime.    [provider]   Social History   Social History  . Marital status: Married    Spouse name: N/A  . Number of children: N/A  . Years of education: N/A   Occupational History  . RETIRED Retired   Social History Main Topics  . Smoking status: Former Smoker    Packs/day: 2.00    Years: 10.00    Types: Cigarettes    Quit date: 07/18/1975  . Smokeless tobacco: Never Used  . Alcohol use Yes     Comment: 1-2 drinks/day  . Drug use: No  . Sexual activity: Not on file   Other Topics Concern  . Not on file   Social History Narrative  . No narrative on file      Review of Systems  Constitutional: Negative for chills and fever.  HENT: Positive for nosebleeds.   Eyes: Negative for pain and redness.  Respiratory: Negative for cough and choking.   Gastrointestinal: Negative for nausea and vomiting.  Musculoskeletal: Negative for neck pain and neck stiffness.  Skin: Positive for wound.  Neurological: Negative for syncope and speech difficulty.       Objective:   Physical Exam  Constitutional: He appears well-developed and well-nourished. No distress.  HENT:  Head: Normocephalic and atraumatic.  Mouth/Throat: Oropharynx is clear and moist.   Small amount of erythema on the nasal septum on the right and left, no bleeding. No discharge in  the posterior oropharynx.   Pulmonary/Chest: Effort normal and breath sounds normal. No respiratory distress.  Abdominal: Soft. There is no tenderness.  Neurological: He is alert.  Skin: Skin is warm and dry.  Left lower abdominal wall, he has a wound with central exudate that measures approximately 1.5 cm across.  Central exudate is yellow in appearance, minimal induration, surrounding erythema that measures 4 cm vertically and 8 cm horizontally. Wound appears to have a very shallow ulcer to the base , but cant visualize base    Vitals:   01/12/17 1128  BP: 132/74  Pulse: 70  Resp: 17  Temp: 98.1 F (36.7 C)  TempSrc: Oral  SpO2: 98%  Weight: 214 lb (97.1 kg)  Height: 5' 7.5" (1.715 m)         Assessment & Plan:    Jonathan [name] is a 83 y.o. male Cellulitis, abdominal wall - Plan: doxycycline (VIBRA-TABS) 100 MG tablet, mupirocin cream (BACTROBAN) 2 %, WOUND CULTURE History of MRSA infection - Plan: doxycycline (VIBRA-TABS) 100 MG tablet, cephALEXin (KEFLEX) 500 MG capsule, mupirocin cream (BACTROBAN) 2 %  - Possible infected wound from previous blood blister. Now appears to have either a small abscess or ulcer is superficial with surrounding erythema consistent with cellulitis. History of MRSA  -Continue Bactroban, new prescription of doxycycline given as previous prescription was expired. Add Keflex 500 mg twice a day, until culture returns to determine narrowing of antibiotics.  -Warm compresses, cleansing with soap and water twice a day to 3 times a day. Recheck in 48 hours. RTC precautions if worse.  Nosebleed  -Chronic anticoagulation, chronic sinus congestion and use of topical nasal steroid are likely contributory.  -Discussed technique of aiming away from nasal septum when using steroid nasal spray, start saline nasal spray, and if symptoms persist, return to discuss with primary care provider or may need ENT eval.  Meds ordered this encounter  Medications  .  doxycycline (VIBRA-TABS) 100 MG tablet    Sig: Take 1 tablet (100 mg total) by mouth 2 (two) times daily.    Dispense:  20 tablet    Refill:  0  . cephALEXin (KEFLEX) 500 MG capsule    Sig: Take 1 capsule (500 mg total) by mouth 2 (two) times daily.    Dispense:  20 capsule    Refill:  0  . mupirocin cream (BACTROBAN) 2 %    Sig: Apply 1 application topically 2 (two) times daily.    Dispense:  15 g    Refill:  0   Patient Instructions      Prescription of doxycycline given, also start Keflex twice per day until we have the results of the wound culture. Clean area with soap and water 2-3 times per day, then apply Bactroban ointment. Warm compresses 2-3 times a day at the minimum. Recheck in 2 days, sooner if any worsening.  For nosebleeds, try salt water nasal spray few times per day, use the different technique for using the nasal spray, then if the symptoms persist, would recommend evaluation with ear nose and throat or discussion with your primary care provider.   Nosebleed, Adult A nosebleed is when blood comes out of the nose. Nosebleeds are common. Usually, they are not a sign of a serious condition. Nosebleeds can happen if a small blood vessel in your nose starts to  bleed or if the lining of your nose (mucous membrane) cracks. They are commonly caused by:  Allergies.  Colds.  Picking your nose.  Blowing your nose too hard.  An injury from sticking an object into your nose or getting hit in the nose.  Dry or cold air.  Less common causes of nosebleeds include:  Toxic fumes.  Something abnormal in the nose or in the air-filled spaces in the bones of the face (sinuses).  Growths in the nose, such as polyps.  Medicines or conditions that cause blood to clot slowly.  Certain illnesses or procedures that irritate or dry out the nasal passages.  Follow these instructions at home: When you have a nosebleed:  Sit down and tilt your head slightly forward.  Use a  clean towel or tissue to pinch your nostrils under the bony part of your nose. After 10 minutes, let go of your nose and see if bleeding starts again. Do not release pressure before that time. If there is still bleeding, repeat the pinching and holding for 10 minutes until the bleeding stops.  Do not place tissues or gauze in the nose to stop bleeding.  Avoid lying down and avoid tilting your head backward. That may make blood collect in the throat and cause gagging or coughing.  Use a nasal spray decongestant to help with a nosebleed as told by your health care provider.  Do not use petroleum jelly or mineral oil in your nose. It can drip into your lungs. After a nosebleed:  Avoid blowing your nose or sniffing for a number of hours.  Avoid straining, lifting, or bending at the waist for several days. You may resume other normal activities as you are able.  Use saline spray or a humidifier as told by your health care provider.  Aspirinand blood thinners make bleeding more likely. If you are prescribed these medicines and you suffer from nosebleeds: ? Ask your health care provider if you should stop taking the medicines or if you should adjust the dose. ? Do not stop taking medicines that your health care provider has recommended unless told by your health care provider.  If your nosebleed was caused by dry mucous membranes, use over-the-counter saline nasal spray or gel. This will keep the mucous membranes moist and allow them to heal. If you must use a lubricant: ? Choose one that is water-soluble. ? Use only as much as you need and use it only as often as needed. ? Do not lie down until several hours after you use it. Contact a health care provider if:  You have a fever.  You get nosebleeds often or more often than usual.  You bruise very easily.  You have a nosebleed from having something stuck in your nose.  You have bleeding in your mouth.  You vomit or cough up brown  material.  You have a nosebleed after you start a new medicine. Get help right away if:  You have a nosebleed after a fall or a head injury.  Your nosebleed does not go away after 20 minutes.  You feel dizzy or weak.  You have unusual bleeding from other parts of your body.  You have unusual bruising on other parts of your body.  You become sweaty.  You vomit blood. This information is not intended to replace advice given to you by your health care provider. Make sure you discuss any questions you have with your health care provider. Document Released: 03/11/2005 Document Revised:  01/30/2016 Document Reviewed: 12/17/2015 Elsevier Interactive Patient Education  2018 [employer].  Cellulitis, Adult Cellulitis is a skin infection. The infected area is usually red and tender. This condition occurs most often in the arms and lower legs. The infection can travel to the muscles, blood, and underlying tissue and become serious. It is very important to get treated for this condition. What are the causes? Cellulitis is caused by bacteria. The bacteria enter through a break in the skin, such as a cut, burn, insect bite, open sore, or crack. What increases the risk? This condition is more likely to occur in people who:  Have a weak defense system (immune system).  Have open wounds on the skin such as cuts, burns, bites, and scrapes. Bacteria can enter the body through these open wounds.  Are older.  Have diabetes.  Have a type of long-lasting (chronic) liver disease (cirrhosis) or kidney disease.  Use IV drugs.  What are the signs or symptoms? Symptoms of this condition include:  Redness, streaking, or spotting on the skin.  Swollen area of the skin.  Tenderness or pain when an area of the skin is touched.  Warm skin.  Fever.  Chills.  Blisters.  How is this diagnosed? This condition is diagnosed based on a medical history and physical exam. You may also have tests,  including:  Blood tests.  Lab tests.  Imaging tests.  How is this treated? Treatment for this condition may include:  Medicines, such as antibiotic medicines or antihistamines.  Supportive care, such as rest and application of cold or warm cloths (cold or warm compresses) to the skin.  Hospital care, if the condition is severe.  The infection usually gets better within 1-2 days of treatment. Follow these instructions at home:  Take over-the-counter and prescription medicines only as told by your health care provider.  If you were prescribed an antibiotic medicine, take it as told by your health care provider. Do not stop taking the antibiotic even if you start to feel better.  Drink enough fluid to keep your urine clear or pale yellow.  Do not touch or rub the infected area.  Raise (elevate) the infected area above the level of your heart while you are sitting or lying down.  Apply warm or cold compresses to the affected area as told by your health care provider.  Keep all follow-up visits as told by your health care provider. This is important. These visits let your health care provider make sure a more serious infection is not developing. Contact a health care provider if:  You have a fever.  Your symptoms do not improve within 1-2 days of starting treatment.  Your bone or joint underneath the infected area becomes painful after the skin has healed.  Your infection returns in the same area or another area.  You notice a swollen bump in the infected area.  You develop new symptoms.  You have a general ill feeling (malaise) with muscle aches and pains. Get help right away if:  Your symptoms get worse.  You feel very sleepy.  You develop vomiting or diarrhea that persists.  You notice red streaks coming from the infected area.  Your red area gets larger or turns dark in color. This information is not intended to replace advice given to you by your health care  provider. Make sure you discuss any questions you have with your health care provider. Document Released: 03/11/2005 Document Revised: 10/10/2015 Document Reviewed: 04/10/2015 Elsevier Interactive Patient Education  2017 [place].   IF you received an x-ray today, you will receive an invoice from [facility]. Please contact [facility] at [phone] with questions or concerns regarding your invoice.   IF you received labwork today, you will receive an invoice from [facility]. Please contact [facility] at [phone] with questions or concerns regarding your invoice.   Our billing staff will not be able to assist you with questions regarding bills from these companies.  You will be contacted with the lab results as soon as they are available. The fastest way to get your results is to activate your My Chart account. Instructions are located on the last page of this paperwork. If you have not heard from [place] regarding the results in 2 weeks, please contact this office.       I personally performed the services described in this documentation, which was scribed in my presence. The recorded information has been reviewed and considered for accuracy and completeness, addended by me as needed, and agree with information above.  Signed,   Merri Ray, MD [facility].  01/12/17 1:52 PM

## 2017-01-12 NOTE — Patient Instructions (Addendum)
Prescription of doxycycline given, also start Keflex twice per day until we have the results of the wound culture. Clean area with soap and water 2-3 times per day, then apply Bactroban ointment. Warm compresses 2-3 times a day at the minimum. Recheck in 2 days, sooner if any worsening.  For nosebleeds, try salt water nasal spray few times per day, use the different technique for using the nasal spray, then if the symptoms persist, would recommend evaluation with ear nose and throat or discussion with your primary care provider.   Nosebleed, Adult A nosebleed is when blood comes out of the nose. Nosebleeds are common. Usually, they are not a sign of a serious condition. Nosebleeds can happen if a small blood vessel in your nose starts to bleed or if the lining of your nose (mucous membrane) cracks. They are commonly caused by:  Allergies.  Colds.  Picking your nose.  Blowing your nose too hard.  An injury from sticking an object into your nose or getting hit in the nose.  Dry or cold air.  Less common causes of nosebleeds include:  Toxic fumes.  Something abnormal in the nose or in the air-filled spaces in the bones of the face (sinuses).  Growths in the nose, such as polyps.  Medicines or conditions that cause blood to clot slowly.  Certain illnesses or procedures that irritate or dry out the nasal passages.  Follow these instructions at home: When you have a nosebleed:  Sit down and tilt your head slightly forward.  Use a clean towel or tissue to pinch your nostrils under the bony part of your nose. After 10 minutes, let go of your nose and see if bleeding starts again. Do not release pressure before that time. If there is still bleeding, repeat the pinching and holding for 10 minutes until the bleeding stops.  Do not place tissues or gauze in the nose to stop bleeding.  Avoid lying down and avoid tilting your head backward. That may make blood collect in the throat  and cause gagging or coughing.  Use a nasal spray decongestant to help with a nosebleed as told by your health care provider.  Do not use petroleum jelly or mineral oil in your nose. It can drip into your lungs. After a nosebleed:  Avoid blowing your nose or sniffing for a number of hours.  Avoid straining, lifting, or bending at the waist for several days. You may resume other normal activities as you are able.  Use saline spray or a humidifier as told by your health care provider.  Aspirinand blood thinners make bleeding more likely. If you are prescribed these medicines and you suffer from nosebleeds: ? Ask your health care provider if you should stop taking the medicines or if you should adjust the dose. ? Do not stop taking medicines that your health care provider has recommended unless told by your health care provider.  If your nosebleed was caused by dry mucous membranes, use over-the-counter saline nasal spray or gel. This will keep the mucous membranes moist and allow them to heal. If you must use a lubricant: ? Choose one that is water-soluble. ? Use only as much as you need and use it only as often as needed. ? Do not lie down until several hours after you use it. Contact a health care provider if:  You have a fever.  You get nosebleeds often or more often than usual.  You bruise very easily.  You have  a nosebleed from having something stuck in your nose.  You have bleeding in your mouth.  You vomit or cough up brown material.  You have a nosebleed after you start a new medicine. Get help right away if:  You have a nosebleed after a fall or a head injury.  Your nosebleed does not go away after 20 minutes.  You feel dizzy or weak.  You have unusual bleeding from other parts of your body.  You have unusual bruising on other parts of your body.  You become sweaty.  You vomit blood. This information is not intended to replace advice given to you by your  health care provider. Make sure you discuss any questions you have with your health care provider. Document Released: 03/11/2005 Document Revised: 01/30/2016 Document Reviewed: 12/17/2015 Elsevier Interactive Patient Education  2018 [employer].  Cellulitis, Adult Cellulitis is a skin infection. The infected area is usually red and tender. This condition occurs most often in the arms and lower legs. The infection can travel to the muscles, blood, and underlying tissue and become serious. It is very important to get treated for this condition. What are the causes? Cellulitis is caused by bacteria. The bacteria enter through a break in the skin, such as a cut, burn, insect bite, open sore, or crack. What increases the risk? This condition is more likely to occur in people who:  Have a weak defense system (immune system).  Have open wounds on the skin such as cuts, burns, bites, and scrapes. Bacteria can enter the body through these open wounds.  Are older.  Have diabetes.  Have a type of long-lasting (chronic) liver disease (cirrhosis) or kidney disease.  Use IV drugs.  What are the signs or symptoms? Symptoms of this condition include:  Redness, streaking, or spotting on the skin.  Swollen area of the skin.  Tenderness or pain when an area of the skin is touched.  Warm skin.  Fever.  Chills.  Blisters.  How is this diagnosed? This condition is diagnosed based on a medical history and physical exam. You may also have tests, including:  Blood tests.  Lab tests.  Imaging tests.  How is this treated? Treatment for this condition may include:  Medicines, such as antibiotic medicines or antihistamines.  Supportive care, such as rest and application of cold or warm cloths (cold or warm compresses) to the skin.  Hospital care, if the condition is severe.  The infection usually gets better within 1-2 days of treatment. Follow these instructions at home:  Take  over-the-counter and prescription medicines only as told by your health care provider.  If you were prescribed an antibiotic medicine, take it as told by your health care provider. Do not stop taking the antibiotic even if you start to feel better.  Drink enough fluid to keep your urine clear or pale yellow.  Do not touch or rub the infected area.  Raise (elevate) the infected area above the level of your heart while you are sitting or lying down.  Apply warm or cold compresses to the affected area as told by your health care provider.  Keep all follow-up visits as told by your health care provider. This is important. These visits let your health care provider make sure a more serious infection is not developing. Contact a health care provider if:  You have a fever.  Your symptoms do not improve within 1-2 days of starting treatment.  Your bone or joint underneath the infected area  becomes painful after the skin has healed.  Your infection returns in the same area or another area.  You notice a swollen bump in the infected area.  You develop new symptoms.  You have a general ill feeling (malaise) with muscle aches and pains. Get help right away if:  Your symptoms get worse.  You feel very sleepy.  You develop vomiting or diarrhea that persists.  You notice red streaks coming from the infected area.  Your red area gets larger or turns dark in color. This information is not intended to replace advice given to you by your health care provider. Make sure you discuss any questions you have with your health care provider. Document Released: 03/11/2005 Document Revised: 10/10/2015 Document Reviewed: 04/10/2015 Elsevier Interactive Patient Education  2017 [employer].   IF you received an x-ray today, you will receive an invoice from [facility]. Please contact [facility] at [phone] with questions or concerns regarding your invoice.   IF you received  labwork today, you will receive an invoice from [place]. Please contact [facility] at [phone] with questions or concerns regarding your invoice.   Our billing staff will not be able to assist you with questions regarding bills from these companies.  You will be contacted with the lab results as soon as they are available. The fastest way to get your results is to activate your My Chart account. Instructions are located on the last page of this paperwork. If you have not heard from [place] regarding the results in 2 weeks, please contact this office.

## 2017-01-14 ENCOUNTER — Encounter: Payer: Self-pay | Admitting: Family Medicine

## 2017-01-14 ENCOUNTER — Ambulatory Visit (INDEPENDENT_AMBULATORY_CARE_PROVIDER_SITE_OTHER): Payer: Medicare Other | Admitting: Family Medicine

## 2017-01-14 VITALS — BP 135/70 | HR 65 | Temp 97.6°F | Resp 18 | Ht 67.5 in | Wt 212.8 lb

## 2017-01-14 DIAGNOSIS — L03311 Cellulitis of abdominal wall: Secondary | ICD-10-CM

## 2017-01-14 DIAGNOSIS — R040 Epistaxis: Secondary | ICD-10-CM | POA: Diagnosis not present

## 2017-01-14 NOTE — Progress Notes (Signed)
By signing my name below, I, Mesha Guinyard, attest that this documentation has been prepared under the direction and in the presence of Merri Ray, MD.  Electronically Signed: Verlee Monte, Medical Scribe. 01/14/17. 2:37 PM.  Subjective:    Patient ID: Jonathan [name], male    DOB: 1932/08/09, 83 y.o.   MRN: [id number]  HPI Chief Complaint  Patient presents with  . Follow-up    stomach wound    HPI Comments: Jonathan [name] is a 83 y.o. male who presents to Primary Care at [facility] for cellulitis/infected wound of abdominal wall follow-up. Initial evaluation 2 days ago. Erythema 4x8 cm around a central 1.5 cm wound. He was started on keflex and doxycycline as he had been taking an expired doxy a few days prior; continued bactroban. Wound culture still in process. Currently few gram positive cocci.  Pt is compliant with bactroban, doxycycline, and keflex. Pt stopped using adhesive bandages on concerned area since the adhesive was irritating his skin. He's not sure if it's improving since he can't see the area but denies worsening pain. Notes getting cellulitis 3x last year. Denies fever, chills, experiencing negative side effects from doxycycline.  Has not had nosebleeds since last visit. Using saline nasal spray, has stopped the steroid nasal spray.  Patient Active Problem List   Diagnosis Date Noted  . Unilateral occipital headache 12/22/2016  . Obesity (BMI 30.0-34.9) 12/08/2016  . Depression 09/07/2016  . Melena 03/26/2016  . Hypothyroidism 03/08/2016  . GERD (gastroesophageal reflux disease) 03/08/2016  . Gout 03/08/2016  . Glaucoma 03/08/2016  . Type 2 diabetes mellitus ([place]) 03/08/2016  . Hereditary and idiopathic peripheral neuropathy 08/28/2015  . Memory disorder 08/28/2015  . Fatigue 01/08/2015  . Bruit 08/15/2013  . Hypertension 12/04/2011  . Allergic rhinitis, seasonal 12/04/2011  . Sleep apnea, obstructive 08/25/2011  . Atrial fibrillation with RVR ([place])  07/20/2011  . CKD (chronic kidney disease) 07/20/2011  . COPD (chronic obstructive pulmonary disease) ([place]) 07/18/2011   Past Medical History:  Diagnosis Date  . Atrial fibrillation ([place])   . BPH (benign prostatic hyperplasia)   . Bronchitis   . Cataract   . COPD (chronic obstructive pulmonary disease) (HCC)    2 liters O2 HS  . Depression   . Fatty tumor    waste and back  . Fibromyalgia   . GERD (gastroesophageal reflux disease)   . Glaucoma    bilateral eyes  . Gout   . Hereditary and idiopathic peripheral neuropathy 08/28/2015  . Hypertension   . Hypothyroidism   . Hypoxia   . Insomnia   . Memory disorder 08/28/2015  . On home oxygen therapy    at night with cpap  . Osteoarthritis   . Peripheral neuropathy   . Rotator cuff tear, left   . Sleep apnea    uses cpap-add oxygen at night  . Spinal compression fracture (HCC) seventh vertebre  . Transient alteration of awareness 08/28/2015  . Type 2 diabetes mellitus ([place]) 03/08/2016  . Wears glasses    Past Surgical History:  Procedure Laterality Date  . APPENDECTOMY  age 21  . CARPAL TUNNEL RELEASE Right    early 2000s  . CATARACT EXTRACTION     bilateral  . CHOLECYSTECTOMY  age 33  . EYE SURGERY    . FOOT ARTHRODESIS Right 02/02/2013   Procedure: RIGHT HALLUX METATARSAL PHALANGEAL JOINT ARTHRODESIS ;  Surgeon: Wylene Simmer, MD;  Location: [facility];  Service: Orthopedics;  Laterality: Right;  . INGUINAL HERNIA  REPAIR  age 88   rt side  . NASAL CONCHA BULLOSA RESECTION  age 70  . PROSTATE SURGERY    . SHOULDER ARTHROSCOPY W/ ROTATOR CUFF REPAIR Right    early 2000s  . tonsil    . VASECTOMY  age 55   No Known Allergies Prior to Admission medications   Medication Sig Start Date End Date Taking? Authorizing Provider  acetaminophen (TYLENOL) 500 MG tablet Take 2 tablets (1,000 mg total) by mouth 2 (two) times daily. 11/23/12  Yes Posey Boyer, MD  alendronate (FOSAMAX) 70 MG tablet Take 70 mg by  mouth every Monday. 01/09/16  Yes [provider]  allopurinol (ZYLOPRIM) 300 MG tablet Take 300 mg by mouth every evening.  10/31/14  Yes [provider]  apixaban (ELIQUIS) 5 MG TABS tablet Take 1 tablet (5 mg total) by mouth 2 (two) times daily. 11/16/16  Yes Lelon Perla, MD  atorvastatin (LIPITOR) 20 MG tablet Take 1 tablet (20 mg total) by mouth daily. 12/15/16  Yes Burns, Claudina Lick, MD  Biotin 5000 MCG TABS Take 5,000 mcg by mouth daily.   Yes [provider]  brimonidine (ALPHAGAN) 0.15 % ophthalmic solution Place 1 drop into the left eye 2 (two) times daily.  12/06/14  Yes [provider]  Calcium Lactate 750 MG TABS Take 1 tablet by mouth daily.    Yes [provider]  cephALEXin (KEFLEX) 500 MG capsule Take 1 capsule (500 mg total) by mouth 2 (two) times daily. 01/12/17  Yes Wendie Agreste, MD  Cholecalciferol (VITAMIN D3 PO) Take 1,000 mg by mouth daily.   Yes [provider]  colchicine (COLCRYS) 0.6 MG tablet Take 0.6 mg by mouth daily as needed (flareups).    Yes [provider]  diltiazem (CARDIZEM CD) 180 MG 24 hr capsule Take 1 capsule (180 mg total) by mouth daily. 09/14/16  Yes Burns, Claudina Lick, MD  doxazosin (CARDURA) 8 MG tablet Take 1 tablet (8 mg total) by mouth daily. PATIENT NEEDS OFFICE VISIT FOR ADDITIONAL REFILLS 07/15/13  Yes Posey Boyer, MD  doxycycline (VIBRA-TABS) 100 MG tablet Take 1 tablet (100 mg total) by mouth 2 (two) times daily. 01/12/17  Yes Wendie Agreste, MD  DULoxetine (CYMBALTA) 30 MG capsule Take 60 mg by mouth daily.    Yes [provider]  latanoprost (XALATAN) 0.005 % ophthalmic solution Place 1 drop into both eyes at bedtime. 05/02/12  Yes Posey Boyer, MD  levalbuterol [facility] HFA) 45 MCG/ACT inhaler Inhale 1-2 puffs into the lungs every 4 (four) hours as needed for wheezing. 07/06/13  Yes Byrum, Rose Fillers, MD  levothyroxine (SYNTHROID, LEVOTHROID) 88 MCG tablet Take 1 tablet  (88 mcg total) by mouth daily. 09/14/16  Yes Burns, Claudina Lick, MD  Lifitegrast Shirley Friar) 5 % SOLN Apply to eye.   Yes [provider]  loratadine (CLARITIN) 10 MG tablet Take 10 mg by mouth daily.   Yes [provider]  magnesium oxide (MAG-OX) 400 MG tablet Take 400 mg by mouth daily.   Yes [provider]  mirabegron ER (MYRBETRIQ) 25 MG TB24 tablet Take 25 mg by mouth daily.   Yes [provider]  mometasone (NASONEX) 50 MCG/ACT nasal spray Place 2 sprays into the nose daily. 12/06/14  Yes [provider]  Multiple Vitamin (MULTIVITAMIN) tablet Take 1 tablet by mouth daily.   Yes [provider]  mupirocin cream (BACTROBAN) 2 % Apply 1 application topically 2 (two) times  daily. 01/12/17  Yes Wendie Agreste, MD  nitroGLYCERIN (NITROSTAT) 0.4 MG SL tablet Place 1 tablet (0.4 mg total) under the tongue every 5 (five) minutes as needed for chest pain. 12/13/14  Yes Barrett, Evelene Croon, PA-C  NON FORMULARY Inhale 2 L into the lungs at bedtime. CPAP with O2 2L   Yes [provider]  omeprazole (PRILOSEC) 20 MG capsule Take 20 mg by mouth daily.   Yes [provider]  oxyCODONE (OXY IR/ROXICODONE) 5 MG immediate release tablet Take 5 mg by mouth every 4 (four) hours as needed for severe pain.   Yes [provider]  oxyCODONE (OXYCONTIN) 10 mg 12 hr tablet Take 10 mg by mouth every 12 (twelve) hours as needed.   Yes [provider]  oxyCODONE (ROXICODONE) 15 MG immediate release tablet Take 15 mg by mouth every 4 (four) hours as needed for pain.   Yes [provider]  potassium gluconate (EQL POTASSIUM GLUCONATE) 595 (99 K) MG TABS tablet Take 595 mg by mouth daily.   Yes [provider]  pregabalin (LYRICA) 100 MG capsule Take 1 tablet in AM, Take 2 tablet in PM 12/08/16  Yes Burns, Claudina Lick, MD  tadalafil (CIALIS) 20 MG tablet Take 1 tablet (20 mg total) by mouth daily as needed. For erectile dysfunction  12/08/16  Yes Burns, Claudina Lick, MD  Tiotropium Bromide-Olodaterol (STIOLTO RESPIMAT) 2.5-2.5 MCG/ACT AERS Inhale 2 puffs into the lungs daily. 11/27/16  Yes Collene Gobble, MD  tiZANidine (ZANAFLEX) 4 MG capsule Take 1 capsule (4 mg total) by mouth at bedtime. 12/08/16  Yes Burns, Claudina Lick, MD  vitamin C (ASCORBIC ACID) 500 MG tablet Take 1,000 mg by mouth daily.    Yes [provider]  zaleplon (SONATA) 10 MG capsule Take 10 mg by mouth at bedtime.    Yes [provider]  hydrochlorothiazide (HYDRODIURIL) 25 MG tablet Take 1 tablet (25 mg total) by mouth daily. 09/16/16 12/15/16  Lelon Perla, MD  Melatonin 5 MG TABS Take 5 mg by mouth at bedtime.    [provider]   Social History   Social History  . Marital status: Married    Spouse name: N/A  . Number of children: N/A  . Years of education: N/A   Occupational History  . RETIRED Retired   Social History Main Topics  . Smoking status: Former Smoker    Packs/day: 2.00    Years: 10.00    Types: Cigarettes    Quit date: 07/18/1975  . Smokeless tobacco: Never Used  . Alcohol use Yes     Comment: 1-2 drinks/day  . Drug use: No  . Sexual activity: Not on file   Other Topics Concern  . Not on file   Social History Narrative  . No narrative on file   Review of Systems  Constitutional: Negative for chills and fever.  Gastrointestinal: Negative for abdominal pain and diarrhea.  Skin: Positive for wound.   Objective:  Physical Exam  Constitutional: He appears well-developed and well-nourished. No distress.  HENT:  Head: Normocephalic and atraumatic.  Eyes: Conjunctivae are normal.  Neck: Neck supple.  Cardiovascular: Normal rate.   Pulmonary/Chest: Effort normal.  Neurological: He is alert.  Skin: Skin is warm and dry.  Faint erythema surrounding central wound Erythema approx 5cm x 2cm Central wound appears to be contracting A very small amount of white yellow exudate centrally without significant  induration measuring 1 cm across  Psychiatric: He has a  normal mood and affect. His behavior is normal.  Nursing note and vitals reviewed.   Vitals:   01/14/17 1427  BP: 135/70  Pulse: 65  Resp: 18  Temp: 97.6 F (36.4 C)  TempSrc: Oral  SpO2: 96%  Weight: 212 lb 12.8 oz (96.5 kg)  Height: 5' 7.5" (1.715 m)   Body mass index is 32.84 kg/m. Assessment & Plan:   Jonathan [name] is a 83 y.o. male Cellulitis, abdominal wall  - Improving. Preliminary culture results reviewed with gram-positive cocci, but no identification or sensitivities. Continue dual coverage with Keflex and doxycycline for now, along with Bactroban topical as it is improving. Likely will decrease one oral medication once culture results are obtained.  -Continue symptomatic care, RTC precautions, recheck in 5 days, sooner if not continuing to improve.  Nosebleed  -No recurrence since last visit. Suspect irritation from steroid nasal spray. He has abstained from steroid nasal spray for now, just using saline nasal spray.  -Slowly return to steroid nasal spray, then follow-up with primary care provider versus ENT if persistent nosebleeds.  No orders of the defined types were placed in this encounter.  Patient Instructions    Follow up early next week if possible for recheck of wound - sooner if worse. Continue same antibiotics for now until wound culture returns. Return to the clinic or go to the nearest emergency room if any of your symptoms worsen or new symptoms occur.   Cellulitis, Adult Cellulitis is a skin infection. The infected area is usually red and tender. This condition occurs most often in the arms and lower legs. The infection can travel to the muscles, blood, and underlying tissue and become serious. It is very important to get treated for this condition. What are the causes? Cellulitis is caused by bacteria. The bacteria enter through a break in the skin, such as a cut, burn, insect bite, open sore,  or crack. What increases the risk? This condition is more likely to occur in people who:  Have a weak defense system (immune system).  Have open wounds on the skin such as cuts, burns, bites, and scrapes. Bacteria can enter the body through these open wounds.  Are older.  Have diabetes.  Have a type of long-lasting (chronic) liver disease (cirrhosis) or kidney disease.  Use IV drugs.  What are the signs or symptoms? Symptoms of this condition include:  Redness, streaking, or spotting on the skin.  Swollen area of the skin.  Tenderness or pain when an area of the skin is touched.  Warm skin.  Fever.  Chills.  Blisters.  How is this diagnosed? This condition is diagnosed based on a medical history and physical exam. You may also have tests, including:  Blood tests.  Lab tests.  Imaging tests.  How is this treated? Treatment for this condition may include:  Medicines, such as antibiotic medicines or antihistamines.  Supportive care, such as rest and application of cold or warm cloths (cold or warm compresses) to the skin.  Hospital care, if the condition is severe.  The infection usually gets better within 1-2 days of treatment. Follow these instructions at home:  Take over-the-counter and prescription medicines only as told by your health care provider.  If you were prescribed an antibiotic medicine, take it as told by your health care provider. Do not stop taking the antibiotic even if you start to feel better.  Drink enough fluid to keep your urine clear or pale yellow.  Do not touch or  rub the infected area.  Raise (elevate) the infected area above the level of your heart while you are sitting or lying down.  Apply warm or cold compresses to the affected area as told by your health care provider.  Keep all follow-up visits as told by your health care provider. This is important. These visits let your health care provider make sure a more serious  infection is not developing. Contact a health care provider if:  You have a fever.  Your symptoms do not improve within 1-2 days of starting treatment.  Your bone or joint underneath the infected area becomes painful after the skin has healed.  Your infection returns in the same area or another area.  You notice a swollen bump in the infected area.  You develop new symptoms.  You have a general ill feeling (malaise) with muscle aches and pains. Get help right away if:  Your symptoms get worse.  You feel very sleepy.  You develop vomiting or diarrhea that persists.  You notice red streaks coming from the infected area.  Your red area gets larger or turns dark in color. This information is not intended to replace advice given to you by your health care provider. Make sure you discuss any questions you have with your health care provider. Document Released: 03/11/2005 Document Revised: 10/10/2015 Document Reviewed: 04/10/2015 Elsevier Interactive Patient Education  2017 [employer].    IF you received an x-ray today, you will receive an invoice from [facility]. Please contact [facility] at [phone] with questions or concerns regarding your invoice.   IF you received labwork today, you will receive an invoice from [place]. Please contact [facility] at [phone] with questions or concerns regarding your invoice.   Our billing staff will not be able to assist you with questions regarding bills from these companies.  You will be contacted with the lab results as soon as they are available. The fastest way to get your results is to activate your My Chart account. Instructions are located on the last page of this paperwork. If you have not heard from [place] regarding the results in 2 weeks, please contact this office.      I personally performed the services described in this documentation, which was scribed in my presence. The recorded information has been  reviewed and considered for accuracy and completeness, addended by me as needed, and agree with information above.  Signed,   Merri Ray, MD Primary [facility] at [facility].  01/14/17 2:49 PM

## 2017-01-14 NOTE — Patient Instructions (Addendum)
Follow up early next week if possible for recheck of wound - sooner if worse. Continue same antibiotics for now until wound culture returns. Return to the clinic or go to the nearest emergency room if any of your symptoms worsen or new symptoms occur.   Cellulitis, Adult Cellulitis is a skin infection. The infected area is usually red and tender. This condition occurs most often in the arms and lower legs. The infection can travel to the muscles, blood, and underlying tissue and become serious. It is very important to get treated for this condition. What are the causes? Cellulitis is caused by bacteria. The bacteria enter through a break in the skin, such as a cut, burn, insect bite, open sore, or crack. What increases the risk? This condition is more likely to occur in people who:  Have a weak defense system (immune system).  Have open wounds on the skin such as cuts, burns, bites, and scrapes. Bacteria can enter the body through these open wounds.  Are older.  Have diabetes.  Have a type of long-lasting (chronic) liver disease (cirrhosis) or kidney disease.  Use IV drugs.  What are the signs or symptoms? Symptoms of this condition include:  Redness, streaking, or spotting on the skin.  Swollen area of the skin.  Tenderness or pain when an area of the skin is touched.  Warm skin.  Fever.  Chills.  Blisters.  How is this diagnosed? This condition is diagnosed based on a medical history and physical exam. You may also have tests, including:  Blood tests.  Lab tests.  Imaging tests.  How is this treated? Treatment for this condition may include:  Medicines, such as antibiotic medicines or antihistamines.  Supportive care, such as rest and application of cold or warm cloths (cold or warm compresses) to the skin.  Hospital care, if the condition is severe.  The infection usually gets better within 1-2 days of treatment. Follow these instructions at  home:  Take over-the-counter and prescription medicines only as told by your health care provider.  If you were prescribed an antibiotic medicine, take it as told by your health care provider. Do not stop taking the antibiotic even if you start to feel better.  Drink enough fluid to keep your urine clear or pale yellow.  Do not touch or rub the infected area.  Raise (elevate) the infected area above the level of your heart while you are sitting or lying down.  Apply warm or cold compresses to the affected area as told by your health care provider.  Keep all follow-up visits as told by your health care provider. This is important. These visits let your health care provider make sure a more serious infection is not developing. Contact a health care provider if:  You have a fever.  Your symptoms do not improve within 1-2 days of starting treatment.  Your bone or joint underneath the infected area becomes painful after the skin has healed.  Your infection returns in the same area or another area.  You notice a swollen bump in the infected area.  You develop new symptoms.  You have a general ill feeling (malaise) with muscle aches and pains. Get help right away if:  Your symptoms get worse.  You feel very sleepy.  You develop vomiting or diarrhea that persists.  You notice red streaks coming from the infected area.  Your red area gets larger or turns dark in color. This information is not intended to replace advice given  to you by your health care provider. Make sure you discuss any questions you have with your health care provider. Document Released: 03/11/2005 Document Revised: 10/10/2015 Document Reviewed: 04/10/2015 Elsevier Interactive Patient Education  2017 [employer].    IF you received an x-ray today, you will receive an invoice from [facility]. Please contact [facility] at [phone] with questions or concerns regarding your invoice.   IF  you received labwork today, you will receive an invoice from [place]. Please contact [facility] at [phone] with questions or concerns regarding your invoice.   Our billing staff will not be able to assist you with questions regarding bills from these companies.  You will be contacted with the lab results as soon as they are available. The fastest way to get your results is to activate your My Chart account. Instructions are located on the last page of this paperwork. If you have not heard from [place] regarding the results in 2 weeks, please contact this office.

## 2017-01-15 LAB — WOUND CULTURE

## 2017-01-19 ENCOUNTER — Telehealth: Payer: Self-pay | Admitting: Cardiology

## 2017-01-19 NOTE — Telephone Encounter (Signed)
New message   Pt states that his a fib was out of control yesterday. He states this was not palps. States it happened for 10 hours but not he feels better. Requests a call back

## 2017-01-19 NOTE — Telephone Encounter (Signed)
Agree Jonathan [name]  

## 2017-01-19 NOTE — Telephone Encounter (Signed)
Returned the call to the patient. He stated that he had an afib episode yesterday for roughly 10 hours. He was symptomatic with mental fog and lack of energy. His heart rate was from the 40's-150's. He was encouraged by his daughter to go to the ED but he refused. He took an extra 180 mg Cardizem and went back into sinus later that night. He stated that he feels better today and feels like he is back in sinus rhythm. An appointment has been made with a PA for tomorrow 8/8. Will route to the primary physician for further recommendation.

## 2017-01-19 NOTE — Telephone Encounter (Signed)
Spoke with the patient to inform him that Dr. Stanford Breed did agree with is appointment tomorrow. He has been advised that he should go to the emergency room if he becomes symptomatic again for further assessment. He verbalized his understanding.

## 2017-01-19 NOTE — Progress Notes (Signed)
Cardiology Office Note    Date:  01/20/2017   ID:  Jonathan [name], DOB [birth year], MRN [id number]  PCP:  Binnie Rail, MD  Cardiologist: Dr. Stanford Breed   Chief Complaint  Patient presents with  . Follow-up    Elevated HR    History of Present Illness:    Jonathan [name] is a 83 y.o. male withy past medical history of PAF (on Eliquis), HTN, Hypothyroidism, Type 2 DM and COPD who presents to the office today for evaluation of an elevated heart rate.   He was last examined by Dr. Stanford Breed in 07/2016 and reported having dyspnea on exertion at baseline but denied any recent chest pain or palpitations. He was continued on Cardizem CD 180mg  daily for rate-control and Eliquis 5mg  BID for anticoagulation.  He called the office on 01/19/2017 and reported being in atrial fibrillation for 10+ hours the day prior with HR variable in the 40's - 150's. He took an additional PO Cardizem and went back into NSR.   In talking with the patient today, he reports checking his heart rate on his iWatch. On 8/6, his heart rate was reading as variable in the 40's to 150's but his stepdaughter checked his pulse and it was > 100. He reports feeling a "vibration" in his chest at that time but breathing was normal. No recent orthopnea, PND, or lower extremity edema. Did note a "mental fog" which has occurred multiple times over the past several months. Per his report, he has been undergoing workup by Neurology for this with an MRI being negative.    He reports episodes of his elevated heart rate occurring 2-3 times per month. He says these usually only last for a few hours before spontaneously resolving. He is unaware of these unless he checks his HR on his phone. He reports good compliance with Eliquis and denies missing any recent doses. No evidence of active bleeding.    Past Medical History:  Diagnosis Date  . Atrial fibrillation (HCC)    a. paroxysmal, on Eliquis for anticoagulation  . BPH (benign prostatic  hyperplasia)   . Bronchitis   . Cataract   . COPD (chronic obstructive pulmonary disease) (HCC)    2 liters O2 HS  . Depression   . Fatty tumor    waste and back  . Fibromyalgia   . GERD (gastroesophageal reflux disease)   . Glaucoma    bilateral eyes  . Gout   . Hereditary and idiopathic peripheral neuropathy 08/28/2015  . Hypertension   . Hypothyroidism   . Hypoxia   . Insomnia   . Memory disorder 08/28/2015  . On home oxygen therapy    at night with cpap  . Osteoarthritis   . Peripheral neuropathy   . Rotator cuff tear, left   . Sleep apnea    uses cpap-add oxygen at night  . Spinal compression fracture (HCC) seventh vertebre  . Transient alteration of awareness 08/28/2015  . Type 2 diabetes mellitus ([place]) 03/08/2016  . Wears glasses     Past Surgical History:  Procedure Laterality Date  . APPENDECTOMY  age 68  . CARPAL TUNNEL RELEASE Right    early 2000s  . CATARACT EXTRACTION     bilateral  . CHOLECYSTECTOMY  age 83  . EYE SURGERY    . FOOT ARTHRODESIS Right 02/02/2013   Procedure: RIGHT HALLUX METATARSAL PHALANGEAL JOINT ARTHRODESIS ;  Surgeon: Wylene Simmer, MD;  Location: [place];  Service: Orthopedics;  Laterality: Right;  .  INGUINAL HERNIA REPAIR  age 72   rt side  . NASAL CONCHA BULLOSA RESECTION  age 80  . PROSTATE SURGERY    . SHOULDER ARTHROSCOPY W/ ROTATOR CUFF REPAIR Right    early 2000s  . tonsil    . VASECTOMY  age 75    Current Medications: Outpatient Medications Prior to Visit  Medication Sig Dispense Refill  . acetaminophen (TYLENOL) 500 MG tablet Take 2 tablets (1,000 mg total) by mouth 2 (two) times daily. 360 tablet 1  . alendronate (FOSAMAX) 70 MG tablet Take 70 mg by mouth every Monday.  4  . allopurinol (ZYLOPRIM) 300 MG tablet Take 300 mg by mouth every evening.     Marland Kitchen apixaban (ELIQUIS) 5 MG TABS tablet Take 1 tablet (5 mg total) by mouth 2 (two) times daily. 180 tablet 1  . atorvastatin (LIPITOR) 20 MG tablet Take 1  tablet (20 mg total) by mouth daily. 90 tablet 3  . Biotin 5000 MCG TABS Take 5,000 mcg by mouth daily.    . brimonidine (ALPHAGAN) 0.15 % ophthalmic solution Place 1 drop into the left eye 2 (two) times daily.     . Calcium Lactate 750 MG TABS Take 1 tablet by mouth daily.     . cephALEXin (KEFLEX) 500 MG capsule Take 1 capsule (500 mg total) by mouth 2 (two) times daily. 20 capsule 0  . Cholecalciferol (VITAMIN D3 PO) Take 1,000 mg by mouth daily.    . colchicine (COLCRYS) 0.6 MG tablet Take 0.6 mg by mouth daily as needed (flareups).     . doxazosin (CARDURA) 8 MG tablet Take 1 tablet (8 mg total) by mouth daily. PATIENT NEEDS OFFICE VISIT FOR ADDITIONAL REFILLS 30 tablet 0  . doxycycline (VIBRA-TABS) 100 MG tablet Take 1 tablet (100 mg total) by mouth 2 (two) times daily. 20 tablet 0  . DULoxetine (CYMBALTA) 30 MG capsule Take 60 mg by mouth daily.     . hydrochlorothiazide (HYDRODIURIL) 25 MG tablet Take 1 tablet (25 mg total) by mouth daily. 90 tablet 3  . latanoprost (XALATAN) 0.005 % ophthalmic solution Place 1 drop into both eyes at bedtime. 7.5 mL 3  . levalbuterol (XOPENEX HFA) 45 MCG/ACT inhaler Inhale 1-2 puffs into the lungs every 4 (four) hours as needed for wheezing. 1 Inhaler 12  . levothyroxine (SYNTHROID, LEVOTHROID) 88 MCG tablet Take 1 tablet (88 mcg total) by mouth daily. 90 tablet 1  . Lifitegrast (XIIDRA) 5 % SOLN Apply to eye.    . loratadine (CLARITIN) 10 MG tablet Take 10 mg by mouth daily.    . magnesium oxide (MAG-OX) 400 MG tablet Take 400 mg by mouth daily.    . mirabegron ER (MYRBETRIQ) 25 MG TB24 tablet Take 25 mg by mouth daily.    . mometasone (NASONEX) 50 MCG/ACT nasal spray Place 2 sprays into the nose daily.    . Multiple Vitamin (MULTIVITAMIN) tablet Take 1 tablet by mouth daily.    . mupirocin cream (BACTROBAN) 2 % Apply 1 application topically 2 (two) times daily. 15 g 0  . nitroGLYCERIN (NITROSTAT) 0.4 MG SL tablet Place 1 tablet (0.4 mg total) under the  tongue every 5 (five) minutes as needed for chest pain. 90 tablet 3  . NON FORMULARY Inhale 2 L into the lungs at bedtime. CPAP with O2 2L    . omeprazole (PRILOSEC) 20 MG capsule Take 20 mg by mouth daily.    Marland Kitchen oxyCODONE (OXY IR/ROXICODONE) 5 MG immediate release tablet Take  5 mg by mouth every 4 (four) hours as needed for severe pain.    Marland Kitchen oxyCODONE (OXYCONTIN) 10 mg 12 hr tablet Take 10 mg by mouth every 12 (twelve) hours as needed.    Marland Kitchen oxyCODONE (ROXICODONE) 15 MG immediate release tablet Take 15 mg by mouth every 4 (four) hours as needed for pain.    . potassium gluconate (EQL POTASSIUM GLUCONATE) 595 (99 K) MG TABS tablet Take 595 mg by mouth daily.    . pregabalin (LYRICA) 100 MG capsule Take 1 tablet in AM, Take 2 tablet in PM 270 capsule 0  . tadalafil (CIALIS) 20 MG tablet Take 1 tablet (20 mg total) by mouth daily as needed. For erectile dysfunction 10 tablet 1  . Tiotropium Bromide-Olodaterol (STIOLTO RESPIMAT) 2.5-2.5 MCG/ACT AERS Inhale 2 puffs into the lungs daily. 3 Inhaler 3  . tiZANidine (ZANAFLEX) 4 MG capsule Take 1 capsule (4 mg total) by mouth at bedtime. 30 capsule 5  . vitamin C (ASCORBIC ACID) 500 MG tablet Take 1,000 mg by mouth daily.     . zaleplon (SONATA) 10 MG capsule Take 10 mg by mouth at bedtime.     Marland Kitchen diltiazem (CARDIZEM CD) 180 MG 24 hr capsule Take 1 capsule (180 mg total) by mouth daily. 90 capsule 3  . Melatonin 5 MG TABS Take 5 mg by mouth at bedtime.     No facility-administered medications prior to visit.      Allergies:   Patient has no known allergies.   Social History   Social History  . Marital status: Married    Spouse name: N/A  . Number of children: N/A  . Years of education: N/A   Occupational History  . RETIRED Retired   Social History Main Topics  . Smoking status: Former Smoker    Packs/day: 2.00    Years: 10.00    Types: Cigarettes    Quit date: 07/18/1975  . Smokeless tobacco: Never Used  . Alcohol use Yes     Comment: 1-2  drinks/day  . Drug use: No  . Sexual activity: Not Asked   Other Topics Concern  . None   Social History Narrative  . None     Family History:  The patient's family history includes Arthritis in his mother; Coronary artery disease in his brother; Heart disease in his father; Kidney cancer in his father; Lung cancer in his father and mother; Prostate cancer in his father.   Review of Systems:   Please see the history of present illness.     General:  No chills, fever, night sweats or weight changes.  Cardiovascular:  No chest pain, dyspnea on exertion, edema, orthopnea, paroxysmal nocturnal dyspnea. Positive for palpitations.  Dermatological: No rash, lesions/masses Respiratory: No cough, dyspnea Urologic: No hematuria, dysuria Abdominal:   No nausea, vomiting, diarrhea, bright red blood per rectum, melena, or hematemesis Neurologic:  No visual changes or wkns. Positive for changes in mental status.   All other systems reviewed and are otherwise negative except as noted above.   Physical Exam:    VS:  BP 112/64   Pulse 76   Ht 5\' 6"  (1.676 m)   Wt 212 lb (96.2 kg)   BMI 34.22 kg/m    General: Well developed, well nourished Caucasian male appearing in no acute distress. Head: Normocephalic, atraumatic, sclera non-icteric, no xanthomas, nares are without discharge.  Neck: No carotid bruits. JVD not elevated.  Lungs: Respirations regular and unlabored, without wheezes or rales.  Heart:  Regular rate and rhythm. No S3 or S4.  No murmur, no rubs, or gallops appreciated. Abdomen: Soft, non-tender, non-distended with normoactive bowel sounds. No hepatomegaly. No rebound/guarding. No obvious abdominal masses. Msk:  Strength and tone appear normal for age. No joint deformities or effusions. Extremities: No clubbing or cyanosis. No lower extremity edema.  Distal pedal pulses are 2+ bilaterally. Neuro: Alert and oriented X 3. Moves all extremities spontaneously. No focal deficits  noted. Psych:  Responds to questions appropriately with a normal affect. Skin: No rashes or lesions noted  Wt Readings from Last 3 Encounters:  01/20/17 212 lb (96.2 kg)  01/14/17 212 lb 12.8 oz (96.5 kg)  01/12/17 214 lb (97.1 kg)    Studies/Labs Reviewed:   EKG:  EKG is ordered today.  The ekg ordered today demonstrates NSR, HR 76, with no acute ST or T-wave changes.   Recent Labs: 03/12/2016: Magnesium 2.2 09/02/2016: Hemoglobin 13.8; Platelets 128 12/08/2016: ALT 22; BUN 27; Creatinine, Ser 1.23; Potassium 4.4; Sodium 141; TSH 2.70   Lipid Panel    Component Value Date/Time   CHOL 195 12/08/2016 1143   TRIG 103.0 12/08/2016 1143   HDL 54.30 12/08/2016 1143   CHOLHDL 4 12/08/2016 1143   VLDL 20.6 12/08/2016 1143   LDLCALC 120 (H) 12/08/2016 1143    Additional studies/ records that were reviewed today include:   NST: 12/2014  Nuclear stress EF: 56%.  The left ventricular ejection fraction is normal (55-65%).  The study is normal.  This is a low risk study.   Nl Lexiscan myoview  Assessment:    1. Paroxysmal atrial fibrillation (HCC)   2. Long term current use of anticoagulant   3. Essential hypertension   4. Other specified hypothyroidism   5. Chronic obstructive pulmonary disease, unspecified COPD type ([place])      Plan:   In order of problems listed above:  1. Paroxysmal Atrial Fibrillation/ Use of Long-term anticoagulation - the patient has a known history of paroxysmal atrial fibrillation and believes he was in atrial fibrillation just two days prior to today's visit as HR was variable on his iWatch and was peaking into the 150's while at rest. He denies any associated dyspnea or palpitations. Did feel a "vibration" in his chest along with a "mental fog" (being followed by Neurology).  - EKG today shows that he is in NSR. He is very Therapist, nutritional and I reviewed with him that the AliveCor device would be helpful in monitoring his recurrent episodes.    - will increase Cardizem CD from 180mg  daily to 240mg  daily to assist with breakthrough episodes. We briefly discussed antiarrhythmic medications but he would prefer titration of his current medications which certainly seems reasonable as he is not overly symptomatic when in AF.  - This patients CHA2DS2-VASc Score and unadjusted Ischemic Stroke Rate (% per year) is equal to 4.8 % stroke rate/year from a score of 4 (HTN, DM, Age (2)). He denies any evidence of active bleeding. Continue Eliquis 5mg  BID for anticoagulation.   2. HTN - BP is well-controlled at 112/64. - continue current medications with dose adjustment of Cardizem as above. I encouraged him to monitor his BP at home and if he experiences episodes of hypotension, we can reduce his HCTZ dosing from 25mg  daily to 12.5mg  daily.   3. Hypothyroidism - reports Synthroid dosing was recently adjusted by his PCP. TSH was stable at 2.70 when last checked in 11/2016.  4. COPD - using Xopenex as needed. Agree with  this in comparison to Albuterol in an effort to avoid rebound tachycardia.    Medication Adjustments/Labs and Tests Ordered: Current medicines are reviewed at length with the patient today.  Concerns regarding medicines are outlined above.  Medication changes, Labs and Tests ordered today are listed in the Patient Instructions below. Patient Instructions  Medication Instructions: INCREASE your Diltiazem to 240mg  tablet daily.  If you need a refill on your cardiac medications before your next appointment, please call your pharmacy.    Follow-Up: Your physician wants you to follow-up in: 6 months with Dr. Stanford Breed. You will receive a reminder letter in the mail two months in advance. If you don't receive a letter, please call our office to schedule this follow-up appointment.  Special Instructions:  Please do some research on the AliveCor to see if this is something that may interest you in monitoring your atrial  fibrillation.  Thank you for choosing Heartcare at [facility]!!    Signed, Erma Heritage, PA-C  01/20/2017 1:10 PM    [place] [address], [place], [place]  [address] Phone: [phone]; Fax: [phone]  [address], [place], [place] [address] Phone: [phone]

## 2017-01-20 ENCOUNTER — Ambulatory Visit (INDEPENDENT_AMBULATORY_CARE_PROVIDER_SITE_OTHER): Payer: Medicare Other | Admitting: Student

## 2017-01-20 ENCOUNTER — Encounter: Payer: Self-pay | Admitting: Student

## 2017-01-20 VITALS — BP 112/64 | HR 76 | Ht 66.0 in | Wt 212.0 lb

## 2017-01-20 DIAGNOSIS — E038 Other specified hypothyroidism: Secondary | ICD-10-CM

## 2017-01-20 DIAGNOSIS — I10 Essential (primary) hypertension: Secondary | ICD-10-CM

## 2017-01-20 DIAGNOSIS — Z7901 Long term (current) use of anticoagulants: Secondary | ICD-10-CM

## 2017-01-20 DIAGNOSIS — I480 Paroxysmal atrial fibrillation: Secondary | ICD-10-CM

## 2017-01-20 DIAGNOSIS — J449 Chronic obstructive pulmonary disease, unspecified: Secondary | ICD-10-CM | POA: Diagnosis not present

## 2017-01-20 MED ORDER — DILTIAZEM HCL ER COATED BEADS 240 MG PO CP24: 240.0000 mg | ORAL_CAPSULE | Freq: Every day | ORAL | 0 refills | Status: DC

## 2017-01-20 MED ORDER — DILTIAZEM HCL ER COATED BEADS 240 MG PO CP24: 240.0000 mg | ORAL_CAPSULE | Freq: Every day | ORAL | 3 refills | Status: DC

## 2017-01-20 NOTE — Patient Instructions (Signed)
Medication Instructions: INCREASE your Diltiazem to 240mg  tablet daily.  If you need a refill on your cardiac medications before your next appointment, please call your pharmacy.    Follow-Up: Your physician wants you to follow-up in: 6 months with Dr. Stanford Breed. You will receive a reminder letter in the mail two months in advance. If you don't receive a letter, please call our office to schedule this follow-up appointment.   Special Instructions:  Please do some research on the AliveCor to see if this is something that may interest you in monitoring your atrial fibrillation.    Thank you for choosing Heartcare at [facility]!!

## 2017-01-21 DIAGNOSIS — M25531 Pain in right wrist: Secondary | ICD-10-CM | POA: Diagnosis not present

## 2017-01-25 ENCOUNTER — Telehealth: Payer: Self-pay | Admitting: Cardiology

## 2017-01-25 NOTE — Telephone Encounter (Signed)
Spoke with pt, he reports extreme fatigue. He will sleep 8-9 hours, be up and active and then crashes and will have to take a 2-3 hour nap. His pulse is uneven by his watch, 40-50 to 100-140, average is 90 bpm. He can not tell if he is in atrial fib or not. His bp is ranging 101/86, 162/90, and 144/83. 02 sats have been 95-96% by his report. He is complaining of occ slight chest pain.Follow up scheduled with dr Stanford Breed

## 2017-01-25 NOTE — Telephone Encounter (Signed)
Jonathan [name] is calling because he continues to feel not well and his pulse is not steady . Having to take naps more during the day.

## 2017-01-26 ENCOUNTER — Ambulatory Visit (INDEPENDENT_AMBULATORY_CARE_PROVIDER_SITE_OTHER): Payer: Medicare Other | Admitting: Cardiology

## 2017-01-26 ENCOUNTER — Encounter: Payer: Self-pay | Admitting: Cardiology

## 2017-01-26 VITALS — BP 136/68 | HR 60 | Ht 66.0 in | Wt 215.0 lb

## 2017-01-26 DIAGNOSIS — I10 Essential (primary) hypertension: Secondary | ICD-10-CM

## 2017-01-26 DIAGNOSIS — R0602 Shortness of breath: Secondary | ICD-10-CM | POA: Diagnosis not present

## 2017-01-26 DIAGNOSIS — I480 Paroxysmal atrial fibrillation: Secondary | ICD-10-CM

## 2017-01-26 DIAGNOSIS — R002 Palpitations: Secondary | ICD-10-CM | POA: Diagnosis not present

## 2017-01-26 NOTE — Patient Instructions (Signed)
Medication Instructions:   NO CHANGE  Labwork:  Your physician recommends that you HAVE LAB WORK TODAY  Testing/Procedures:  Your physician has requested that you have an echocardiogram. Echocardiography is a painless test that uses sound waves to create images of your heart. It provides your doctor with information about the size and shape of your heart and how well your heart's chambers and valves are working. This procedure takes approximately one hour. There are no restrictions for this procedure.   Your physician has recommended that you wear an event monitor. Event monitors are medical devices that record the heart's electrical activity. Doctors most often [place] these monitors to diagnose arrhythmias. Arrhythmias are problems with the speed or rhythm of the heartbeat. The monitor is a small, portable device. You can wear one while you do your normal daily activities. This is usually used to diagnose what is causing palpitations/syncope (passing out).    Follow-Up:  Your physician recommends that you schedule a follow-up appointment in: 8-12 [place]   If you need a refill on your cardiac medications before your next appointment, please call your pharmacy.

## 2017-01-26 NOTE — Progress Notes (Signed)
HPI: FU atrial fibrillation. Previously with a fib but converted spontaneously to sinus rhythm. Note the patient had an echocardiogram in [place] in November of 2012 that showed an ejection fraction of 59% with no valvular abnormalities. Patient also with significant COPD. Abdominal ultrasound March 2015 showed no aneurysm. Nuclear study July 2016 showed ejection fraction 56% and normal perfusion. Patient was seen recently in the office for what he thought was paroxysmal atrial fibrillation. He was in sinus rhythm at that time. His Cardizem was increased. Since I last saw him patient complains of fatigue. He has dyspnea on exertion but no orthopnea, PND or pedal edema. No exertional chest pain. He has an occasional feeling of "buzzing" in his chest like a cell phone going off. His cell phone appears to show his heart rate in the 30s at times and 150 at other times by his report.  Current Outpatient Prescriptions  Medication Sig Dispense Refill  . acetaminophen (TYLENOL) 500 MG tablet Take 2 tablets (1,000 mg total) by mouth 2 (two) times daily. 360 tablet 1  . alendronate (FOSAMAX) 70 MG tablet Take 70 mg by mouth every Monday.  4  . allopurinol (ZYLOPRIM) 300 MG tablet Take 300 mg by mouth every evening.     Marland Kitchen apixaban (ELIQUIS) 5 MG TABS tablet Take 1 tablet (5 mg total) by mouth 2 (two) times daily. 180 tablet 1  . atorvastatin (LIPITOR) 20 MG tablet Take 1 tablet (20 mg total) by mouth daily. 90 tablet 3  . Biotin 5000 MCG TABS Take 5,000 mcg by mouth daily.    . brimonidine (ALPHAGAN) 0.15 % ophthalmic solution Place 1 drop into the left eye 2 (two) times daily.     . Calcium Lactate 750 MG TABS Take 1 tablet by mouth daily.     . Cholecalciferol (VITAMIN D3 PO) Take 1,000 mg by mouth daily.    . colchicine (COLCRYS) 0.6 MG tablet Take 0.6 mg by mouth daily as needed (flareups).     . diltiazem (CARDIZEM CD) 240 MG 24 hr capsule Take 1 capsule (240 mg total) by mouth daily. 90 capsule  3  . doxazosin (CARDURA) 8 MG tablet Take 8 mg by mouth daily.     . DULoxetine (CYMBALTA) 30 MG capsule Take 60 mg by mouth daily.     Marland Kitchen latanoprost (XALATAN) 0.005 % ophthalmic solution Place 1 drop into both eyes at bedtime. 7.5 mL 3  . levalbuterol (XOPENEX HFA) 45 MCG/ACT inhaler Inhale 1-2 puffs into the lungs every 4 (four) hours as needed for wheezing. 1 Inhaler 12  . levothyroxine (SYNTHROID, LEVOTHROID) 88 MCG tablet Take 1 tablet (88 mcg total) by mouth daily. 90 tablet 1  . Lifitegrast (XIIDRA) 5 % SOLN Apply to eye.    . loratadine (CLARITIN) 10 MG tablet Take 10 mg by mouth daily.    . magnesium oxide (MAG-OX) 400 MG tablet Take 400 mg by mouth daily.    . mirabegron ER (MYRBETRIQ) 25 MG TB24 tablet Take 25 mg by mouth daily.    . mometasone (NASONEX) 50 MCG/ACT nasal spray Place 2 sprays into the nose daily.    . Multiple Vitamin (MULTIVITAMIN) tablet Take 1 tablet by mouth daily.    . mupirocin cream (BACTROBAN) 2 % Apply 1 application topically 2 (two) times daily. 15 g 0  . nitroGLYCERIN (NITROSTAT) 0.4 MG SL tablet Place 1 tablet (0.4 mg total) under the tongue every 5 (five) minutes as needed for chest pain.  90 tablet 3  . NON FORMULARY Inhale 2 L into the lungs at bedtime. CPAP with O2 2L    . omeprazole (PRILOSEC) 20 MG capsule Take 20 mg by mouth daily.    Marland Kitchen oxyCODONE (OXY IR/ROXICODONE) 5 MG immediate release tablet Take 5 mg by mouth every 4 (four) hours as needed for severe pain.    Marland Kitchen oxyCODONE (OXYCONTIN) 10 mg 12 hr tablet Take 10 mg by mouth every 12 (twelve) hours as needed.    Marland Kitchen oxyCODONE (ROXICODONE) 15 MG immediate release tablet Take 15 mg by mouth every 4 (four) hours as needed for pain.    . potassium gluconate (EQL POTASSIUM GLUCONATE) 595 (99 K) MG TABS tablet Take 595 mg by mouth daily.    . pregabalin (LYRICA) 100 MG capsule Take 1 tablet in AM, Take 2 tablet in PM 270 capsule 0  . tadalafil (CIALIS) 20 MG tablet Take 1 tablet (20 mg total) by mouth daily  as needed. For erectile dysfunction 10 tablet 1  . Tiotropium Bromide-Olodaterol (STIOLTO RESPIMAT) 2.5-2.5 MCG/ACT AERS Inhale 2 puffs into the lungs daily. 3 Inhaler 3  . tiZANidine (ZANAFLEX) 4 MG capsule Take 1 capsule (4 mg total) by mouth at bedtime. 30 capsule 5  . vitamin C (ASCORBIC ACID) 500 MG tablet Take 1,000 mg by mouth daily.     . zaleplon (SONATA) 10 MG capsule Take 10 mg by mouth at bedtime.     . hydrochlorothiazide (HYDRODIURIL) 25 MG tablet Take 1 tablet (25 mg total) by mouth daily. 90 tablet 3   No current facility-administered medications for this visit.      Past Medical History:  Diagnosis Date  . Atrial fibrillation (HCC)    a. paroxysmal, on Eliquis for anticoagulation  . BPH (benign prostatic hyperplasia)   . Bronchitis   . Cataract   . COPD (chronic obstructive pulmonary disease) (HCC)    2 liters O2 HS  . Depression   . Fatty tumor    waste and back  . Fibromyalgia   . GERD (gastroesophageal reflux disease)   . Glaucoma    bilateral eyes  . Gout   . Hereditary and idiopathic peripheral neuropathy 08/28/2015  . Hypertension   . Hypothyroidism   . Hypoxia   . Insomnia   . Memory disorder 08/28/2015  . On home oxygen therapy    at night with cpap  . Osteoarthritis   . Peripheral neuropathy   . Rotator cuff tear, left   . Sleep apnea    uses cpap-add oxygen at night  . Spinal compression fracture (HCC) seventh vertebre  . Transient alteration of awareness 08/28/2015  . Type 2 diabetes mellitus ([place]) 03/08/2016  . Wears glasses     Past Surgical History:  Procedure Laterality Date  . APPENDECTOMY  age 69  . CARPAL TUNNEL RELEASE Right    early 2000s  . CATARACT EXTRACTION     bilateral  . CHOLECYSTECTOMY  age 46  . EYE SURGERY    . FOOT ARTHRODESIS Right 02/02/2013   Procedure: RIGHT HALLUX METATARSAL PHALANGEAL JOINT ARTHRODESIS ;  Surgeon: Wylene Simmer, MD;  Location: [facility];  Service: Orthopedics;  Laterality: Right;    . INGUINAL HERNIA REPAIR  age 70   rt side  . NASAL CONCHA BULLOSA RESECTION  age 79  . PROSTATE SURGERY    . SHOULDER ARTHROSCOPY W/ ROTATOR CUFF REPAIR Right    early 2000s  . tonsil    . VASECTOMY  age  36    Social History   Social History  . Marital status: Married    Spouse name: N/A  . Number of children: N/A  . Years of education: N/A   Occupational History  . RETIRED Retired   Social History Main Topics  . Smoking status: Former Smoker    Packs/day: 2.00    Years: 10.00    Types: Cigarettes    Quit date: 07/18/1975  . Smokeless tobacco: Never Used  . Alcohol use Yes     Comment: 1-2 drinks/day  . Drug use: No  . Sexual activity: Not on file   Other Topics Concern  . Not on file   Social History Narrative  . No narrative on file    Family History  Problem Relation Age of Onset  . Coronary artery disease Brother   . Heart disease Father   . Lung cancer Father   . Kidney cancer Father   . Prostate cancer Father   . Arthritis Mother   . Lung cancer Mother     ROS: no fevers or chills, productive cough, hemoptysis, dysphasia, odynophagia, melena, hematochezia, dysuria, hematuria, rash, seizure activity, orthopnea, PND, pedal edema, claudication. Remaining systems are negative.  Physical Exam: Well-developed well-nourished in no acute distress.  Skin is warm and dry.  HEENT is normal.  Neck is supple.  Chest is clear to auscultation with normal expansion.  Cardiovascular exam is regular rate and rhythm.  Abdominal exam nontender or distended. No masses palpated. Obese Extremities show no edema. neuro grossly intact  ECG-Normal sinus rhythm at a rate of 60. Cannot rule out prior septal infarct. personally reviewed  A/P  1 Paroxysmal atrial fibrillation-patient remains in sinus rhythm on examination. Continue Cardizem for rate control if atrial fibrillation recurs. He is describing heart rates in the 30s and 150s at times but this is based on his  cell phone. He also occasionally has what sounds to be palpitations. Not clear that he is having recurrent atrial fibrillation but if so we could consider an antiarrhythmic. I would like a more definitive evaluation. We will arrange an event monitor to further assess. Continue apixaban. Check hemoglobin and renal function. Plan repeat echocardiogram.  2 hypertension-blood pressure is controlled. Continue present medications.  3 COPD-management per pulmonary.  4 fatigue-etiology unclear. Check hemoglobin. Recent TSH normal. I am not convinced atrial fibrillation is the cause of this but we are checking a monitor as well. Note he does have sleep apnea but is compliant with his CPAP.  Kirk Ruths, MD

## 2017-01-27 LAB — BASIC METABOLIC PANEL
BUN/Creatinine Ratio: 17 (ref 10–24)
BUN: 18 mg/dL (ref 8–27)
CO2: 26 mmol/L (ref 20–29)
Calcium: 9.4 mg/dL (ref 8.6–10.2)
Chloride: 102 mmol/L (ref 96–106)
Creatinine, Ser: 1.03 mg/dL (ref 0.76–1.27)
GFR calc Af Amer: 78 mL/min/{1.73_m2} (ref >59)
GFR calc non Af Amer: 68 mL/min/{1.73_m2} (ref >59)
Glucose: 128 mg/dL — ABNORMAL HIGH (ref 65–99)
Potassium: 3.8 mmol/L (ref 3.5–5.2)
Sodium: 144 mmol/L (ref 134–144)

## 2017-01-27 LAB — CBC
Hematocrit: 38.8 % (ref 37.5–51.0)
Hemoglobin: 13.3 g/dL (ref 13.0–17.7)
MCH: 30.4 pg (ref 26.6–33.0)
MCHC: 34.3 g/dL (ref 31.5–35.7)
MCV: 89 fL (ref 79–97)
Platelets: 143 10*3/uL — ABNORMAL LOW (ref 150–379)
RBC: 4.38 x10E6/uL (ref 4.14–5.80)
RDW: 13.9 % (ref 12.3–15.4)
WBC: 5 10*3/uL (ref 3.4–10.8)

## 2017-02-08 ENCOUNTER — Ambulatory Visit (HOSPITAL_COMMUNITY): Payer: Medicare Other | Attending: Cardiology

## 2017-02-08 ENCOUNTER — Ambulatory Visit (INDEPENDENT_AMBULATORY_CARE_PROVIDER_SITE_OTHER): Payer: Medicare Other

## 2017-02-08 ENCOUNTER — Other Ambulatory Visit: Payer: Self-pay

## 2017-02-08 DIAGNOSIS — E669 Obesity, unspecified: Secondary | ICD-10-CM | POA: Diagnosis not present

## 2017-02-08 DIAGNOSIS — R0602 Shortness of breath: Secondary | ICD-10-CM | POA: Diagnosis not present

## 2017-02-08 DIAGNOSIS — Z87891 Personal history of nicotine dependence: Secondary | ICD-10-CM | POA: Insufficient documentation

## 2017-02-08 DIAGNOSIS — E119 Type 2 diabetes mellitus without complications: Secondary | ICD-10-CM | POA: Diagnosis not present

## 2017-02-08 DIAGNOSIS — R002 Palpitations: Secondary | ICD-10-CM | POA: Diagnosis not present

## 2017-02-08 DIAGNOSIS — J449 Chronic obstructive pulmonary disease, unspecified: Secondary | ICD-10-CM | POA: Insufficient documentation

## 2017-02-08 DIAGNOSIS — I10 Essential (primary) hypertension: Secondary | ICD-10-CM | POA: Diagnosis not present

## 2017-02-08 DIAGNOSIS — I4891 Unspecified atrial fibrillation: Secondary | ICD-10-CM | POA: Diagnosis not present

## 2017-02-11 ENCOUNTER — Telehealth: Payer: Self-pay | Admitting: Cardiology

## 2017-02-11 DIAGNOSIS — I480 Paroxysmal atrial fibrillation: Secondary | ICD-10-CM

## 2017-02-11 NOTE — Telephone Encounter (Signed)
Continue monitor and schedule fu paov Jonathan [name]

## 2017-02-11 NOTE — Telephone Encounter (Signed)
Ncolette from [employer] called and stated patient had a critical EKG. She stated patient went into AFib with a heart rate of 140-150. Wen to sinus for 20 seconds then went back in AFib.

## 2017-02-11 NOTE — Telephone Encounter (Signed)
Retrieved critical strip from [employer]-patient had episode of Afib RVR rate 150 at 10:10 AM.   Called and spoke to patient,  Patient was aware of episode.  Reports he was lying down and started having palpitations, therefore triggered his monitor.   Reports asymptomatic otherwise.  Reports at current HR 90, he is about to take a nap, denies symptoms at current.   Reports his HR has been "all over the place", that it will increase for approximately 5 mins then decrease back down.    Currently on Eliquis and cardizem CD 240.  Reports compliance.    Per chart review:  1 Paroxysmal atrial fibrillation-patient remains in sinus rhythm on examination. Continue Cardizem for rate control if atrial fibrillation recurs. He is describing heart rates in the 30s and 150s at times but this is based on his cell phone. He also occasionally has what sounds to be palpitations. Not clear that he is having recurrent atrial fibrillation but if so we could consider an antiarrhythmic. I would like a more definitive evaluation. We will arrange an event monitor to further assess. Continue apixaban. Check hemoglobin and renal function. Plan repeat echocardiogram.    Routed to Dr. Stanford Breed for review.  Patient aware and verbalized understanding.

## 2017-02-15 ENCOUNTER — Other Ambulatory Visit: Payer: Self-pay | Admitting: Student

## 2017-02-15 DIAGNOSIS — I480 Paroxysmal atrial fibrillation: Secondary | ICD-10-CM

## 2017-02-16 MED ORDER — DILTIAZEM HCL ER COATED BEADS 240 MG PO CP24: 240.0000 mg | ORAL_CAPSULE | Freq: Every day | ORAL | 3 refills | Status: DC

## 2017-02-16 NOTE — Telephone Encounter (Signed)
Spoke with pt, he is traveling out of town and is not able to make any sooner appts with app or [facility]. We will keep his current appt in November and change it if needed. Pt agreed with this plan.

## 2017-02-16 NOTE — Telephone Encounter (Signed)
REFILL 

## 2017-03-04 ENCOUNTER — Other Ambulatory Visit: Payer: Self-pay

## 2017-03-05 ENCOUNTER — Encounter: Payer: Self-pay | Admitting: Internal Medicine

## 2017-03-05 ENCOUNTER — Other Ambulatory Visit: Payer: Self-pay

## 2017-03-05 MED ORDER — LORATADINE 10 MG PO TABS: 10.0000 mg | ORAL_TABLET | Freq: Every day | ORAL | 1 refills | Status: DC

## 2017-03-08 ENCOUNTER — Other Ambulatory Visit: Payer: Self-pay | Admitting: *Deleted

## 2017-03-08 DIAGNOSIS — I480 Paroxysmal atrial fibrillation: Secondary | ICD-10-CM

## 2017-03-08 MED ORDER — DILTIAZEM HCL ER COATED BEADS 240 MG PO CP24: 240.0000 mg | ORAL_CAPSULE | Freq: Every day | ORAL | 3 refills | Status: DC

## 2017-03-09 NOTE — Progress Notes (Signed)
Subjective:    Patient ID: Jonathan [name], male    DOB: 1933/01/21, 83 y.o.   MRN: [id number]  HPI The patient is here for follow up.   Blister on left side;  The other day he noticed a blister on his left side.   He denies trauma or rubbing in that area from any clothing.  He denies pain.  There is no discharge. He denies fever.  There is no surrounding redness.    Diabetes: He is controlling his sugars with diet. He is compliant with a diabetic diet. He is exercising regularly - walking every other day - 2 miles. He hopes to get up to 5 miles.  He checks his feet daily and denies foot lesions. He is up-to-date with an ophthalmology examination.   Hypertension: He is taking his medication daily. He is compliant with a low sodium diet.  He denies chest pain, shortness of breath and regular headaches. He is exercising regularly.      Hypothyroidism:  He is taking his medication daily.  He denies any recent changes in energy or weight that are unexplained.   Hyperlipidemia: He is taking his medication daily. He is compliant with a low fat/cholesterol diet. He is exercising regularly. He denies myalgias.   Twice this week he was working on his computer and did not remember how to do something he has done a 1000 times.  He slept and felt better.  He has had a few instances like that and is concerned. Resting/sleeping seems to help.  The past two weeks he has had difficulty falling asleep.  Insomnia has been a chronic problem for him, but he was not having issues up until recently - he was not even taking any medication.  A couple of weeks ago he had to start the sonata again and it is not working.  He is using his cpap religiously.    Medications and allergies reviewed with patient and updated if appropriate.  Patient Active Problem List   Diagnosis Date Noted  . Unilateral occipital headache 12/22/2016  . Obesity (BMI 30.0-34.9) 12/08/2016  . Depression 09/07/2016  . Melena 03/26/2016    . Hypothyroidism 03/08/2016  . GERD (gastroesophageal reflux disease) 03/08/2016  . Gout 03/08/2016  . Glaucoma 03/08/2016  . Type 2 diabetes mellitus ([place]) 03/08/2016  . Hereditary and idiopathic peripheral neuropathy 08/28/2015  . Memory disorder 08/28/2015  . Fatigue 01/08/2015  . Bruit 08/15/2013  . Hypertension 12/04/2011  . Allergic rhinitis, seasonal 12/04/2011  . Sleep apnea, obstructive 08/25/2011  . Atrial fibrillation with RVR ([place]) 07/20/2011  . CKD (chronic kidney disease) 07/20/2011  . COPD (chronic obstructive pulmonary disease) ([place]) 07/18/2011    Current Outpatient Prescriptions on File Prior to Visit  Medication Sig Dispense Refill  . acetaminophen (TYLENOL) 500 MG tablet Take 2 tablets (1,000 mg total) by mouth 2 (two) times daily. 360 tablet 1  . alendronate (FOSAMAX) 70 MG tablet Take 70 mg by mouth every Monday.  4  . allopurinol (ZYLOPRIM) 300 MG tablet Take 300 mg by mouth every evening.     Marland Kitchen apixaban (ELIQUIS) 5 MG TABS tablet Take 1 tablet (5 mg total) by mouth 2 (two) times daily. 180 tablet 1  . atorvastatin (LIPITOR) 20 MG tablet Take 1 tablet (20 mg total) by mouth daily. 90 tablet 3  . Biotin 5000 MCG TABS Take 5,000 mcg by mouth daily.    . brimonidine (ALPHAGAN) 0.15 % ophthalmic solution Place 1 drop into the left  eye 2 (two) times daily.     . Calcium Lactate 750 MG TABS Take 1 tablet by mouth daily.     . Cholecalciferol (VITAMIN D3 PO) Take 1,000 mg by mouth daily.    . colchicine (COLCRYS) 0.6 MG tablet Take 0.6 mg by mouth daily as needed (flareups).     . diltiazem (CARDIZEM CD) 240 MG 24 hr capsule Take 1 capsule (240 mg total) by mouth daily. 90 capsule 3  . doxazosin (CARDURA) 8 MG tablet Take 8 mg by mouth daily.     . DULoxetine (CYMBALTA) 30 MG capsule Take 60 mg by mouth daily.     Marland Kitchen latanoprost (XALATAN) 0.005 % ophthalmic solution Place 1 drop into both eyes at bedtime. 7.5 mL 3  . levalbuterol (XOPENEX HFA) 45 MCG/ACT inhaler  Inhale 1-2 puffs into the lungs every 4 (four) hours as needed for wheezing. 1 Inhaler 12  . levothyroxine (SYNTHROID, LEVOTHROID) 88 MCG tablet Take 1 tablet (88 mcg total) by mouth daily. 90 tablet 1  . Lifitegrast (XIIDRA) 5 % SOLN Apply to eye.    . loratadine (CLARITIN) 10 MG tablet Take 1 tablet (10 mg total) by mouth daily. 90 tablet 1  . magnesium oxide (MAG-OX) 400 MG tablet Take 400 mg by mouth daily.    . mirabegron ER (MYRBETRIQ) 25 MG TB24 tablet Take 25 mg by mouth daily.    . mometasone (NASONEX) 50 MCG/ACT nasal spray Place 2 sprays into the nose daily.    . Multiple Vitamin (MULTIVITAMIN) tablet Take 1 tablet by mouth daily.    . mupirocin cream (BACTROBAN) 2 % Apply 1 application topically 2 (two) times daily. 15 g 0  . nitroGLYCERIN (NITROSTAT) 0.4 MG SL tablet Place 1 tablet (0.4 mg total) under the tongue every 5 (five) minutes as needed for chest pain. 90 tablet 3  . NON FORMULARY Inhale 2 L into the lungs at bedtime. CPAP with O2 2L    . omeprazole (PRILOSEC) 20 MG capsule Take 20 mg by mouth daily.    Marland Kitchen oxyCODONE (OXY IR/ROXICODONE) 5 MG immediate release tablet Take 5 mg by mouth every 4 (four) hours as needed for severe pain.    Marland Kitchen oxyCODONE (OXYCONTIN) 10 mg 12 hr tablet Take 10 mg by mouth every 12 (twelve) hours as needed.    Marland Kitchen oxyCODONE (ROXICODONE) 15 MG immediate release tablet Take 15 mg by mouth every 4 (four) hours as needed for pain.    . potassium gluconate (EQL POTASSIUM GLUCONATE) 595 (99 K) MG TABS tablet Take 595 mg by mouth daily.    . pregabalin (LYRICA) 100 MG capsule Take 1 tablet in AM, Take 2 tablet in PM 270 capsule 0  . tadalafil (CIALIS) 20 MG tablet Take 1 tablet (20 mg total) by mouth daily as needed. For erectile dysfunction 10 tablet 1  . Tiotropium Bromide-Olodaterol (STIOLTO RESPIMAT) 2.5-2.5 MCG/ACT AERS Inhale 2 puffs into the lungs daily. 3 Inhaler 3  . tiZANidine (ZANAFLEX) 4 MG capsule Take 1 capsule (4 mg total) by mouth at bedtime. 30  capsule 5  . vitamin C (ASCORBIC ACID) 500 MG tablet Take 1,000 mg by mouth daily.     . zaleplon (SONATA) 10 MG capsule Take 10 mg by mouth at bedtime.     . hydrochlorothiazide (HYDRODIURIL) 25 MG tablet Take 1 tablet (25 mg total) by mouth daily. 90 tablet 3  . [DISCONTINUED] calcium-vitamin D (OSCAL WITH D) 500-200 MG-UNIT per tablet Take 1 tablet by mouth daily.    . [  DISCONTINUED] diphenhydrAMINE (SOMINEX) 25 MG tablet Take 50 mg by mouth at bedtime as needed. For sleep     No current facility-administered medications on file prior to visit.     Past Medical History:  Diagnosis Date  . Atrial fibrillation (HCC)    a. paroxysmal, on Eliquis for anticoagulation  . BPH (benign prostatic hyperplasia)   . Bronchitis   . Cataract   . COPD (chronic obstructive pulmonary disease) (HCC)    2 liters O2 HS  . Depression   . Fatty tumor    waste and back  . Fibromyalgia   . GERD (gastroesophageal reflux disease)   . Glaucoma    bilateral eyes  . Gout   . Hereditary and idiopathic peripheral neuropathy 08/28/2015  . Hypertension   . Hypothyroidism   . Hypoxia   . Insomnia   . Memory disorder 08/28/2015  . On home oxygen therapy    at night with cpap  . Osteoarthritis   . Peripheral neuropathy   . Rotator cuff tear, left   . Sleep apnea    uses cpap-add oxygen at night  . Spinal compression fracture (HCC) seventh vertebre  . Transient alteration of awareness 08/28/2015  . Type 2 diabetes mellitus ([place]) 03/08/2016  . Wears glasses     Past Surgical History:  Procedure Laterality Date  . APPENDECTOMY  age 43  . CARPAL TUNNEL RELEASE Right    early 2000s  . CATARACT EXTRACTION     bilateral  . CHOLECYSTECTOMY  age 41  . EYE SURGERY    . FOOT ARTHRODESIS Right 02/02/2013   Procedure: RIGHT HALLUX METATARSAL PHALANGEAL JOINT ARTHRODESIS ;  Surgeon: Wylene Simmer, MD;  Location: [facility];  Service: Orthopedics;  Laterality: Right;  . INGUINAL HERNIA REPAIR  age 36    rt side  . NASAL CONCHA BULLOSA RESECTION  age 62  . PROSTATE SURGERY    . SHOULDER ARTHROSCOPY W/ ROTATOR CUFF REPAIR Right    early 2000s  . tonsil    . VASECTOMY  age 5    Social History   Social History  . Marital status: Married    Spouse name: N/A  . Number of children: N/A  . Years of education: N/A   Occupational History  . RETIRED Retired   Social History Main Topics  . Smoking status: Former Smoker    Packs/day: 2.00    Years: 10.00    Types: Cigarettes    Quit date: 07/18/1975  . Smokeless tobacco: Never Used  . Alcohol use Yes     Comment: 1-2 drinks/day  . Drug use: No  . Sexual activity: Not Asked   Other Topics Concern  . None   Social History Narrative  . None    Family History  Problem Relation Age of Onset  . Coronary artery disease Brother   . Heart disease Father   . Lung cancer Father   . Kidney cancer Father   . Prostate cancer Father   . Arthritis Mother   . Lung cancer Mother     Review of Systems  Constitutional: Positive for fatigue. Negative for chills and fever.  Respiratory: Positive for cough (occasional, dry). Negative for shortness of breath and wheezing.   Cardiovascular: Positive for palpitations (rare) and leg swelling (wears compression  socks). Negative for chest pain.  Skin: Positive for wound (left side).  Neurological: Negative for light-headedness and headaches.  Psychiatric/Behavioral: Positive for dysphoric mood (denies feeling sad, ? depression - as cause  or effect). Negative for sleep disturbance.       Objective:   Vitals:   03/10/17 0906  BP: (!) 150/68  Pulse: 67  Resp: 16  Temp: 98 F (36.7 C)  SpO2: 95%   Wt Readings from Last 3 Encounters:  03/10/17 218 lb (98.9 kg)  01/26/17 215 lb (97.5 kg)  01/20/17 212 lb (96.2 kg)   Body mass index is 35.19 kg/m.   Physical Exam    Constitutional: Appears well-developed and well-nourished. No distress.  HENT:  Head: Normocephalic and  atraumatic.  Neck: Neck supple. No tracheal deviation present. No thyromegaly present.  No cervical lymphadenopathy Cardiovascular: Normal rate, regular rhythm and normal heart sounds.   No murmur heard. No carotid bruit .  No edema Pulmonary/Chest: Effort normal and breath sounds normal. No respiratory distress. No has no wheezes. No rales.  Skin: Skin is warm and dry. Not diaphoretic. oblong black olive sized blister with clear fluid and mild erythema at base only, no surround erythema or swelling. No open wound or discharge. Psychiatric: Normal mood and affect. Behavior is normal.      Assessment & Plan:    See Problem List for Assessment and Plan of chronic medical problems.

## 2017-03-10 ENCOUNTER — Ambulatory Visit (INDEPENDENT_AMBULATORY_CARE_PROVIDER_SITE_OTHER): Payer: Medicare Other | Admitting: Internal Medicine

## 2017-03-10 ENCOUNTER — Other Ambulatory Visit (INDEPENDENT_AMBULATORY_CARE_PROVIDER_SITE_OTHER): Payer: Medicare Other

## 2017-03-10 ENCOUNTER — Encounter: Payer: Self-pay | Admitting: Internal Medicine

## 2017-03-10 VITALS — BP 150/68 | HR 67 | Temp 98.0°F | Resp 16 | Ht 66.0 in | Wt 218.0 lb

## 2017-03-10 DIAGNOSIS — I10 Essential (primary) hypertension: Secondary | ICD-10-CM

## 2017-03-10 DIAGNOSIS — G8929 Other chronic pain: Secondary | ICD-10-CM

## 2017-03-10 DIAGNOSIS — E038 Other specified hypothyroidism: Secondary | ICD-10-CM

## 2017-03-10 DIAGNOSIS — E119 Type 2 diabetes mellitus without complications: Secondary | ICD-10-CM

## 2017-03-10 DIAGNOSIS — T148XXA Other injury of unspecified body region, initial encounter: Secondary | ICD-10-CM

## 2017-03-10 DIAGNOSIS — G4700 Insomnia, unspecified: Secondary | ICD-10-CM | POA: Insufficient documentation

## 2017-03-10 DIAGNOSIS — E785 Hyperlipidemia, unspecified: Secondary | ICD-10-CM

## 2017-03-10 DIAGNOSIS — Z23 Encounter for immunization: Secondary | ICD-10-CM | POA: Diagnosis not present

## 2017-03-10 HISTORY — DX: Other chronic pain: G89.29

## 2017-03-10 HISTORY — DX: Hyperlipidemia, unspecified: E78.5

## 2017-03-10 LAB — COMPREHENSIVE METABOLIC PANEL
ALT: 17 U/L (ref 0–53)
AST: 20 U/L (ref 0–37)
Albumin: 4.4 g/dL (ref 3.5–5.2)
Alkaline Phosphatase: 71 U/L (ref 39–117)
BUN: 26 mg/dL — ABNORMAL HIGH (ref 6–23)
CO2: 29 mEq/L (ref 19–32)
Calcium: 9.9 mg/dL (ref 8.4–10.5)
Chloride: 101 mEq/L (ref 96–112)
Creatinine, Ser: 1.03 mg/dL (ref 0.40–1.50)
GFR: 73.53 mL/min (ref >60.00)
Glucose, Bld: 141 mg/dL — ABNORMAL HIGH (ref 70–99)
Potassium: 3.7 mEq/L (ref 3.5–5.1)
Sodium: 141 mEq/L (ref 135–145)
Total Bilirubin: 0.5 mg/dL (ref 0.2–1.2)
Total Protein: 6.7 g/dL (ref 6.0–8.3)

## 2017-03-10 LAB — HEMOGLOBIN A1C: Hgb A1c MFr Bld: 7 % — ABNORMAL HIGH (ref 4.6–6.5)

## 2017-03-10 LAB — TSH: TSH: 4.68 u[IU]/mL — ABNORMAL HIGH (ref 0.35–4.50)

## 2017-03-10 MED ORDER — SUVOREXANT 10 MG PO TABS: 10.0000 mg | ORAL_TABLET | Freq: Every evening | ORAL | 3 refills | Status: DC | PRN

## 2017-03-10 MED ORDER — DOXAZOSIN MESYLATE 8 MG PO TABS: 8.0000 mg | ORAL_TABLET | Freq: Every day | ORAL | 1 refills | Status: DC

## 2017-03-10 NOTE — Assessment & Plan Note (Signed)
On left side No infection, but will need to monitor closely for infection - discussed signs to monitor for and he will call if he sees any

## 2017-03-10 NOTE — Assessment & Plan Note (Signed)
Management of pain by preferred pain management

## 2017-03-10 NOTE — Assessment & Plan Note (Signed)
Read Drivers has not been effective Using cpap Will try belsomra if covered - if not may need to try [place]

## 2017-03-10 NOTE — Assessment & Plan Note (Signed)
Continue statin. 

## 2017-03-10 NOTE — Assessment & Plan Note (Addendum)
Check a1c Continue diabetic diet and regular exercise Work on weight loss

## 2017-03-10 NOTE — Assessment & Plan Note (Signed)
BP Readings from Last 3 Encounters:  03/10/17 (!) 150/68  01/26/17 136/68  01/20/17 112/64   BP well controlled Current regimen effective and well tolerated Continue current medications at current doses cmp

## 2017-03-10 NOTE — Patient Instructions (Addendum)
  Test(s) ordered today. Your results will be released to [place] (or called to you) after review, usually within 72hours after test completion. If any changes need to be made, you will be notified at that same time.  All other Health Maintenance issues reviewed.   All recommended immunizations and age-appropriate screenings are up-to-date or discussed.  Flu immunization administered today.    Medications reviewed and updated.  Changes include trying a different sleep medication  Your prescription(s) have been submitted to your pharmacy. Please take as directed and contact our office if you believe you are having problem(s) with the medication(s).   Please followup in 1 month

## 2017-03-10 NOTE — Assessment & Plan Note (Signed)
Check tsh  Titrate med dose if needed  

## 2017-03-12 ENCOUNTER — Encounter: Payer: Self-pay | Admitting: Internal Medicine

## 2017-03-15 ENCOUNTER — Other Ambulatory Visit: Payer: Self-pay | Admitting: Emergency Medicine

## 2017-03-15 MED ORDER — LEVOTHYROXINE SODIUM 100 MCG PO TABS: 100.0000 ug | ORAL_TABLET | Freq: Every day | ORAL | 1 refills | Status: DC

## 2017-03-23 DIAGNOSIS — M47817 Spondylosis without myelopathy or radiculopathy, lumbosacral region: Secondary | ICD-10-CM | POA: Diagnosis not present

## 2017-03-26 ENCOUNTER — Other Ambulatory Visit: Payer: Self-pay | Admitting: Internal Medicine

## 2017-04-01 DIAGNOSIS — M47814 Spondylosis without myelopathy or radiculopathy, thoracic region: Secondary | ICD-10-CM | POA: Diagnosis not present

## 2017-04-01 DIAGNOSIS — M5134 Other intervertebral disc degeneration, thoracic region: Secondary | ICD-10-CM | POA: Diagnosis not present

## 2017-04-01 DIAGNOSIS — M5489 Other dorsalgia: Secondary | ICD-10-CM | POA: Diagnosis not present

## 2017-04-12 DIAGNOSIS — Z9981 Dependence on supplemental oxygen: Secondary | ICD-10-CM

## 2017-04-12 DIAGNOSIS — G629 Polyneuropathy, unspecified: Secondary | ICD-10-CM | POA: Insufficient documentation

## 2017-04-12 DIAGNOSIS — G4730 Sleep apnea, unspecified: Secondary | ICD-10-CM

## 2017-04-12 DIAGNOSIS — D179 Benign lipomatous neoplasm, unspecified: Secondary | ICD-10-CM | POA: Insufficient documentation

## 2017-04-12 DIAGNOSIS — J40 Bronchitis, not specified as acute or chronic: Secondary | ICD-10-CM | POA: Insufficient documentation

## 2017-04-12 DIAGNOSIS — H269 Unspecified cataract: Secondary | ICD-10-CM | POA: Insufficient documentation

## 2017-04-12 DIAGNOSIS — N400 Enlarged prostate without lower urinary tract symptoms: Secondary | ICD-10-CM | POA: Insufficient documentation

## 2017-04-12 DIAGNOSIS — M797 Fibromyalgia: Secondary | ICD-10-CM | POA: Insufficient documentation

## 2017-04-12 DIAGNOSIS — M4850XA Collapsed vertebra, not elsewhere classified, site unspecified, initial encounter for fracture: Secondary | ICD-10-CM | POA: Insufficient documentation

## 2017-04-12 DIAGNOSIS — M1990 Unspecified osteoarthritis, unspecified site: Secondary | ICD-10-CM | POA: Insufficient documentation

## 2017-04-12 DIAGNOSIS — I482 Chronic atrial fibrillation, unspecified: Secondary | ICD-10-CM | POA: Insufficient documentation

## 2017-04-12 DIAGNOSIS — R0902 Hypoxemia: Secondary | ICD-10-CM | POA: Insufficient documentation

## 2017-04-12 DIAGNOSIS — Z973 Presence of spectacles and contact lenses: Secondary | ICD-10-CM | POA: Insufficient documentation

## 2017-04-12 DIAGNOSIS — I480 Paroxysmal atrial fibrillation: Secondary | ICD-10-CM | POA: Insufficient documentation

## 2017-04-12 DIAGNOSIS — M75102 Unspecified rotator cuff tear or rupture of left shoulder, not specified as traumatic: Secondary | ICD-10-CM

## 2017-04-13 ENCOUNTER — Ambulatory Visit: Payer: Medicare Other

## 2017-04-13 ENCOUNTER — Other Ambulatory Visit: Payer: Self-pay | Admitting: Cardiology

## 2017-04-15 ENCOUNTER — Ambulatory Visit (INDEPENDENT_AMBULATORY_CARE_PROVIDER_SITE_OTHER): Payer: Medicare Other | Admitting: Internal Medicine

## 2017-04-15 ENCOUNTER — Encounter: Payer: Self-pay | Admitting: Internal Medicine

## 2017-04-15 VITALS — BP 144/88 | HR 70 | Temp 98.9°F | Resp 16 | Wt 217.0 lb

## 2017-04-15 DIAGNOSIS — G4700 Insomnia, unspecified: Secondary | ICD-10-CM

## 2017-04-15 MED ORDER — ZALEPLON 10 MG PO CAPS: 10.0000 mg | ORAL_CAPSULE | Freq: Every evening | ORAL | Status: DC | PRN

## 2017-04-15 MED ORDER — ACETAMINOPHEN 500 MG PO TABS: 1000.0000 mg | ORAL_TABLET | Freq: Three times a day (TID) | ORAL | 1 refills | Status: DC | PRN

## 2017-04-15 NOTE — Assessment & Plan Note (Signed)
belsomra helped a little, but he still woke up every 2 hrs Restart sonata and changed cymbalta and inhaler to nighttime  - now sleeping w/o difficulty Continue above

## 2017-04-15 NOTE — Patient Instructions (Addendum)
  Medications reviewed and updated.  No changes recommended at this time.     Please followup in 6 months   

## 2017-04-15 NOTE — Progress Notes (Signed)
HPI: FU atrial fibrillation. Previously with a fib but converted spontaneously to sinus rhythm. Note the patient had an echocardiogram in [place] in November of 2012 that showed an ejection fraction of 59% with no valvular abnormalities. Patient also with significant COPD. Abdominal ultrasound March 2015 showed no aneurysm. Nuclear study July 2016 showed ejection fraction 56% and normal perfusion. Patient was seen recently in the office for what he thought was paroxysmal atrial fibrillation. He was in sinus rhythm at that time. His Cardizem was increased. Echocardiogram repeated August 2018 and showed normal LV function, grade 2 diastolic dysfunction and mild left atrial enlargement. Monitor August 2018 showed sinus rhythm with PACs, PVCs and PAF. Since I last saw him patient denies dyspnea, chest pain, palpitations, syncope or bleeding.  Current Outpatient Medications  Medication Sig Dispense Refill  . acetaminophen (TYLENOL) 500 MG tablet Take 2 tablets (1,000 mg total) by mouth every 8 (eight) hours as needed. 360 tablet 1  . alendronate (FOSAMAX) 70 MG tablet Take 70 mg by mouth every Monday.  4  . allopurinol (ZYLOPRIM) 300 MG tablet Take 300 mg by mouth every evening.     Marland Kitchen atorvastatin (LIPITOR) 20 MG tablet Take 1 tablet (20 mg total) by mouth daily. 90 tablet 3  . Biotin 5000 MCG TABS Take 5,000 mcg by mouth daily.    . brimonidine (ALPHAGAN) 0.15 % ophthalmic solution Place 1 drop into the left eye 2 (two) times daily.     . Calcium Lactate 750 MG TABS Take 1 tablet by mouth daily.     . Cholecalciferol (VITAMIN D3 PO) Take 1,000 mg by mouth daily.    . colchicine (COLCRYS) 0.6 MG tablet Take 0.6 mg by mouth daily as needed (flareups).     . diltiazem (CARDIZEM CD) 240 MG 24 hr capsule Take 1 capsule (240 mg total) by mouth daily. 90 capsule 3  . doxazosin (CARDURA) 8 MG tablet Take 1 tablet (8 mg total) by mouth daily. 90 tablet 1  . DULoxetine HCl (CYMBALTA PO) Take 90 mg  daily by mouth.    Arne Cleveland 5 MG TABS tablet TAKE 1 TABLET BY MOUTH TWO  TIMES DAILY 180 tablet 1  . latanoprost (XALATAN) 0.005 % ophthalmic solution Place 1 drop into both eyes at bedtime. 7.5 mL 3  . levalbuterol (XOPENEX HFA) 45 MCG/ACT inhaler Inhale 1-2 puffs into the lungs every 4 (four) hours as needed for wheezing. 1 Inhaler 12  . levothyroxine (SYNTHROID, LEVOTHROID) 100 MCG tablet Take 1 tablet (100 mcg total) by mouth daily. 90 tablet 1  . magnesium oxide (MAG-OX) 400 MG tablet Take 400 mg by mouth daily.    . mirabegron ER (MYRBETRIQ) 25 MG TB24 tablet Take 1 tablet (25 mg total) daily by mouth. 90 tablet 1  . Multiple Vitamin (MULTIVITAMIN) tablet Take 1 tablet by mouth daily.    . mupirocin cream (BACTROBAN) 2 % Apply 1 application topically 2 (two) times daily. 15 g 0  . nitroGLYCERIN (NITROSTAT) 0.4 MG SL tablet Place 1 tablet (0.4 mg total) under the tongue every 5 (five) minutes as needed for chest pain. 90 tablet 3  . NON FORMULARY Inhale 2 L into the lungs at bedtime. CPAP with O2 2L    . omeprazole (PRILOSEC) 20 MG capsule Take 20 mg by mouth daily.    Marland Kitchen oxyCODONE (OXY IR/ROXICODONE) 5 MG immediate release tablet Take 5 mg by mouth every 4 (four) hours as needed for severe pain.    Marland Kitchen  oxyCODONE (OXYCONTIN) 10 mg 12 hr tablet Take 10 mg by mouth every 12 (twelve) hours as needed.    Marland Kitchen oxyCODONE (ROXICODONE) 15 MG immediate release tablet Take 15 mg by mouth every 4 (four) hours as needed for pain.    . potassium gluconate (EQL POTASSIUM GLUCONATE) 595 (99 K) MG TABS tablet Take 595 mg by mouth daily.    . pregabalin (LYRICA) 100 MG capsule Take 1 tablet in AM, Take 2 tablet in PM 270 capsule 0  . tadalafil (CIALIS) 20 MG tablet Take 1 tablet (20 mg total) by mouth daily as needed. For erectile dysfunction 10 tablet 1  . Tiotropium Bromide-Olodaterol (STIOLTO RESPIMAT) 2.5-2.5 MCG/ACT AERS Inhale 2 puffs into the lungs daily. 3 Inhaler 3  . tiZANidine (ZANAFLEX) 4 MG capsule  Take 1 capsule (4 mg total) by mouth at bedtime. 30 capsule 5  . vitamin C (ASCORBIC ACID) 500 MG tablet Take 1,000 mg by mouth daily.     . zaleplon (SONATA) 10 MG capsule Take 1 capsule (10 mg total) at bedtime as needed by mouth for sleep. 90 capsule 0  . Zoster Vaccine Adjuvanted [facility]) injection     . hydrochlorothiazide (HYDRODIURIL) 25 MG tablet Take 1 tablet (25 mg total) by mouth daily. 90 tablet 3   No current facility-administered medications for this visit.      Past Medical History:  Diagnosis Date  . Atrial fibrillation (HCC)    a. paroxysmal, on Eliquis for anticoagulation  . BPH (benign prostatic hyperplasia)   . Bronchitis   . Cataract   . COPD (chronic obstructive pulmonary disease) (HCC)    2 liters O2 HS  . Depression   . Fatty tumor    waste and back  . Fibromyalgia   . GERD (gastroesophageal reflux disease)   . Glaucoma    bilateral eyes  . Gout   . Hereditary and idiopathic peripheral neuropathy 08/28/2015  . Hypertension   . Hypothyroidism   . Hypoxia   . Insomnia   . Memory disorder 08/28/2015  . On home oxygen therapy    at night with cpap  . Osteoarthritis   . Osteoporosis   . Peripheral neuropathy   . Rotator cuff tear, left   . Sleep apnea    uses cpap-add oxygen at night  . Spinal compression fracture (HCC) seventh vertebre  . Transient alteration of awareness 08/28/2015  . Type 2 diabetes mellitus ([place]) 03/08/2016  . Wears glasses     Past Surgical History:  Procedure Laterality Date  . APPENDECTOMY  age 52  . CARPAL TUNNEL RELEASE Right    early 2000s  . CATARACT EXTRACTION     bilateral  . CHOLECYSTECTOMY  age 54  . EYE SURGERY    . INGUINAL HERNIA REPAIR  age 71   rt side  . NASAL CONCHA BULLOSA RESECTION  age 53  . PROSTATE SURGERY    . SHOULDER ARTHROSCOPY W/ ROTATOR CUFF REPAIR Right    early 2000s  . tonsil    . VASECTOMY  age 18    Social History   Socioeconomic History  . Marital status: Married    Spouse  name: Not on file  . Number of children: Not on file  . Years of education: Not on file  . Highest education level: Not on file  Social Needs  . Financial resource strain: Not on file  . Food insecurity - worry: Not on file  . Food insecurity - inability: Not on file  .  Transportation needs - medical: Not on file  . Transportation needs - non-medical: Not on file  Occupational History  . Occupation: RETIRED    Employer: RETIRED  Tobacco Use  . Smoking status: Former Smoker    Packs/day: 2.00    Years: 10.00    Pack years: 20.00    Types: Cigarettes    Last attempt to quit: 07/18/1975    Years since quitting: 41.8  . Smokeless tobacco: Never Used  Substance and Sexual Activity  . Alcohol use: Yes    Comment: 1-2 drinks/day  . Drug use: No  . Sexual activity: Not on file  Other Topics Concern  . Not on file  Social History Narrative  . Not on file    Family History  Problem Relation Age of Onset  . Coronary artery disease Brother   . Heart disease Father   . Lung cancer Father   . Kidney cancer Father   . Prostate cancer Father   . Arthritis Mother   . Lung cancer Mother     ROS: no fevers or chills, productive cough, hemoptysis, dysphasia, odynophagia, melena, hematochezia, dysuria, hematuria, rash, seizure activity, orthopnea, PND, pedal edema, claudication. Remaining systems are negative.  Physical Exam: Well-developed obese in no acute distress.  Skin is warm and dry.  HEENT is normal.  Neck is supple.  Chest is clear to auscultation with normal expansion.  Cardiovascular exam is regular rate and rhythm.  Abdominal exam nontender or distended. No masses palpated. Extremities show no edema. neuro grossly intact  ECG- sinus rhythm at a rate of 98. PVCs. Cannot rule out septal infarct.personally reviewed  A/P  1 paroxysmal atrial fibrillation-patient in sinus rhythm today. His Holter monitor showed recurrent atrial fibrillation. However his symptoms have  improved. Long discussion today concerning options of treatment. We reviewed rate control versus rhythm control. If we can control his rate and he does not have symptoms I think that would be best option. We will increase Cardizem to 300 milligrams daily. Continue apixaban. If he develops symptomatic atrial fibrillation then we would need to consider rhythm control and an antiarrhythmic.  2 hypertension-blood pressure controlled. Continue present medications.  3 obstructive sleep apnea-continue CPAP.  4 COPD-management per primary care.  5 mildly elevated TSH (last 4.68)-management per primary care.  Kirk Ruths, MD

## 2017-04-15 NOTE — Progress Notes (Signed)
Subjective:    Patient ID: Jonathan [name], male    DOB: August 12, 1932, 83 y.o.   MRN: [id number]  HPI The patient is here for follow up.  Insomnia:   He is not taking the belsomra.  He took it for a little while and he would wake up every two hours.   He restarted the sonata.again.  He moved the Cymbalta and Stiolto to the evening and he is sleeping without difficulty.     He had an epidural in the mid-lower back and it was not effective.   He has cervical facet radiofrequency ablation scheduled.  He takes his pain medication but not frequently.     Medications and allergies reviewed with patient and updated if appropriate.  Patient Active Problem List   Diagnosis Date Noted  . Wears glasses   . Spinal compression fracture ([place])   . Sleep apnea   . Rotator cuff tear, left   . Peripheral neuropathy   . Osteoarthritis   . On home oxygen therapy   . Hypoxia   . Fibromyalgia   . Fatty tumor   . Cataract   . Bronchitis   . BPH (benign prostatic hyperplasia)   . Atrial fibrillation ([place])   . Insomnia 03/10/2017  . Chronic pain, legs and back 03/10/2017  . Blister 03/10/2017  . Hyperlipidemia 03/10/2017  . Unilateral occipital headache 12/22/2016  . Obesity (BMI 30.0-34.9) 12/08/2016  . Depression 09/07/2016  . Melena 03/26/2016  . Hypothyroidism 03/08/2016  . GERD (gastroesophageal reflux disease) 03/08/2016  . Gout 03/08/2016  . Glaucoma 03/08/2016  . Type 2 diabetes mellitus ([place]) 03/08/2016  . Hereditary and idiopathic peripheral neuropathy 08/28/2015  . Memory disorder 08/28/2015  . Transient alteration of awareness 08/28/2015  . Fatigue 01/08/2015  . Bruit 08/15/2013  . Hypertension 12/04/2011  . Allergic rhinitis, seasonal 12/04/2011  . Sleep apnea, obstructive 08/25/2011  . Atrial fibrillation with RVR ([place]) 07/20/2011  . CKD (chronic kidney disease) 07/20/2011  . COPD (chronic obstructive pulmonary disease) ([place]) 07/18/2011    Current Outpatient  Prescriptions on File Prior to Visit  Medication Sig Dispense Refill  . alendronate (FOSAMAX) 70 MG tablet Take 70 mg by mouth every Monday.  4  . allopurinol (ZYLOPRIM) 300 MG tablet Take 300 mg by mouth every evening.     Marland Kitchen atorvastatin (LIPITOR) 20 MG tablet Take 1 tablet (20 mg total) by mouth daily. 90 tablet 3  . Biotin 5000 MCG TABS Take 5,000 mcg by mouth daily.    . brimonidine (ALPHAGAN) 0.15 % ophthalmic solution Place 1 drop into the left eye 2 (two) times daily.     . Calcium Lactate 750 MG TABS Take 1 tablet by mouth daily.     . Cholecalciferol (VITAMIN D3 PO) Take 1,000 mg by mouth daily.    . colchicine (COLCRYS) 0.6 MG tablet Take 0.6 mg by mouth daily as needed (flareups).     . diltiazem (CARDIZEM CD) 240 MG 24 hr capsule Take 1 capsule (240 mg total) by mouth daily. 90 capsule 3  . doxazosin (CARDURA) 8 MG tablet Take 1 tablet (8 mg total) by mouth daily. 90 tablet 1  . DULoxetine (CYMBALTA) 30 MG capsule Take 60 mg by mouth daily.     Marland Kitchen ELIQUIS 5 MG TABS tablet TAKE 1 TABLET BY MOUTH TWO  TIMES DAILY 180 tablet 1  . latanoprost (XALATAN) 0.005 % ophthalmic solution Place 1 drop into both eyes at bedtime. 7.5 mL 3  . levalbuterol (XOPENEX  HFA) 45 MCG/ACT inhaler Inhale 1-2 puffs into the lungs every 4 (four) hours as needed for wheezing. 1 Inhaler 12  . levothyroxine (SYNTHROID, LEVOTHROID) 100 MCG tablet Take 1 tablet (100 mcg total) by mouth daily. 90 tablet 1  . magnesium oxide (MAG-OX) 400 MG tablet Take 400 mg by mouth daily.    . mirabegron ER (MYRBETRIQ) 25 MG TB24 tablet Take 25 mg by mouth daily.    . Multiple Vitamin (MULTIVITAMIN) tablet Take 1 tablet by mouth daily.    . mupirocin cream (BACTROBAN) 2 % Apply 1 application topically 2 (two) times daily. 15 g 0  . nitroGLYCERIN (NITROSTAT) 0.4 MG SL tablet Place 1 tablet (0.4 mg total) under the tongue every 5 (five) minutes as needed for chest pain. 90 tablet 3  . NON FORMULARY Inhale 2 L into the lungs at  bedtime. CPAP with O2 2L    . omeprazole (PRILOSEC) 20 MG capsule Take 20 mg by mouth daily.    Marland Kitchen oxyCODONE (OXY IR/ROXICODONE) 5 MG immediate release tablet Take 5 mg by mouth every 4 (four) hours as needed for severe pain.    Marland Kitchen oxyCODONE (OXYCONTIN) 10 mg 12 hr tablet Take 10 mg by mouth every 12 (twelve) hours as needed.    Marland Kitchen oxyCODONE (ROXICODONE) 15 MG immediate release tablet Take 15 mg by mouth every 4 (four) hours as needed for pain.    . potassium gluconate (EQL POTASSIUM GLUCONATE) 595 (99 K) MG TABS tablet Take 595 mg by mouth daily.    . pregabalin (LYRICA) 100 MG capsule Take 1 tablet in AM, Take 2 tablet in PM 270 capsule 0  . tadalafil (CIALIS) 20 MG tablet Take 1 tablet (20 mg total) by mouth daily as needed. For erectile dysfunction 10 tablet 1  . Tiotropium Bromide-Olodaterol (STIOLTO RESPIMAT) 2.5-2.5 MCG/ACT AERS Inhale 2 puffs into the lungs daily. 3 Inhaler 3  . tiZANidine (ZANAFLEX) 4 MG capsule Take 1 capsule (4 mg total) by mouth at bedtime. 30 capsule 5  . vitamin C (ASCORBIC ACID) 500 MG tablet Take 1,000 mg by mouth daily.     . hydrochlorothiazide (HYDRODIURIL) 25 MG tablet Take 1 tablet (25 mg total) by mouth daily. 90 tablet 3  . [DISCONTINUED] calcium-vitamin D (OSCAL WITH D) 500-200 MG-UNIT per tablet Take 1 tablet by mouth daily.    . [DISCONTINUED] diphenhydrAMINE (SOMINEX) 25 MG tablet Take 50 mg by mouth at bedtime as needed. For sleep     No current facility-administered medications on file prior to visit.     Past Medical History:  Diagnosis Date  . Atrial fibrillation (HCC)    a. paroxysmal, on Eliquis for anticoagulation  . BPH (benign prostatic hyperplasia)   . Bronchitis   . Cataract   . COPD (chronic obstructive pulmonary disease) (HCC)    2 liters O2 HS  . Depression   . Fatty tumor    waste and back  . Fibromyalgia   . GERD (gastroesophageal reflux disease)   . Glaucoma    bilateral eyes  . Gout   . Hereditary and idiopathic peripheral  neuropathy 08/28/2015  . Hypertension   . Hypothyroidism   . Hypoxia   . Insomnia   . Memory disorder 08/28/2015  . On home oxygen therapy    at night with cpap  . Osteoarthritis   . Peripheral neuropathy   . Rotator cuff tear, left   . Sleep apnea    uses cpap-add oxygen at night  . Spinal compression  fracture ([place]) seventh vertebre  . Transient alteration of awareness 08/28/2015  . Type 2 diabetes mellitus ([place]) 03/08/2016  . Wears glasses     Past Surgical History:  Procedure Laterality Date  . APPENDECTOMY  age 30  . CARPAL TUNNEL RELEASE Right    early 2000s  . CATARACT EXTRACTION     bilateral  . CHOLECYSTECTOMY  age 38  . EYE SURGERY    . FOOT ARTHRODESIS Right 02/02/2013   Procedure: RIGHT HALLUX METATARSAL PHALANGEAL JOINT ARTHRODESIS ;  Surgeon: Wylene Simmer, MD;  Location: [facility];  Service: Orthopedics;  Laterality: Right;  . INGUINAL HERNIA REPAIR  age 59   rt side  . NASAL CONCHA BULLOSA RESECTION  age 76  . PROSTATE SURGERY    . SHOULDER ARTHROSCOPY W/ ROTATOR CUFF REPAIR Right    early 2000s  . tonsil    . VASECTOMY  age 82    Social History   Social History  . Marital status: Married    Spouse name: N/A  . Number of children: N/A  . Years of education: N/A   Occupational History  . RETIRED Retired   Social History Main Topics  . Smoking status: Former Smoker    Packs/day: 2.00    Years: 10.00    Types: Cigarettes    Quit date: 07/18/1975  . Smokeless tobacco: Never Used  . Alcohol use Yes     Comment: 1-2 drinks/day  . Drug use: No  . Sexual activity: Not Asked   Other Topics Concern  . None   Social History Narrative  . None    Family History  Problem Relation Age of Onset  . Coronary artery disease Brother   . Heart disease Father   . Lung cancer Father   . Kidney cancer Father   . Prostate cancer Father   . Arthritis Mother   . Lung cancer Mother     Review of Systems  Constitutional: Negative for appetite  change, chills and fever.  Musculoskeletal: Positive for back pain and neck pain.  Psychiatric/Behavioral: Positive for sleep disturbance. Negative for dysphoric mood. The patient is not nervous/anxious.        Objective:   Vitals:   04/15/17 1019  BP: (!) 144/88  Pulse: 70  Resp: 16  Temp: 98.9 F (37.2 C)  SpO2: 96%   Wt Readings from Last 3 Encounters:  04/15/17 217 lb (98.4 kg)  03/10/17 218 lb (98.9 kg)  01/26/17 215 lb (97.5 kg)   Body mass index is 35.02 kg/m.   Physical Exam  Constitutional: He appears well-developed and well-nourished. No distress.  Skin: Skin is warm and dry. He is not diaphoretic.  Psychiatric: He has a normal mood and affect. His behavior is normal. Judgment and thought content normal.           Assessment & Plan:    See Problem List for Assessment and Plan of chronic medical problems.

## 2017-04-16 NOTE — Progress Notes (Signed)
Pre visit review using our clinic review tool, if applicable. No additional management support is needed unless otherwise documented below in the visit note. 

## 2017-04-16 NOTE — Progress Notes (Addendum)
Subjective:   Jonathan [name] is a 83 y.o. male who presents for an Initial Medicare Annual Wellness Visit.  Review of Systems  No ROS.  Medicare Wellness Visit. Additional risk factors are reflected in the social history.  Cardiac Risk Factors include: advanced age (>84men, >1 women);diabetes mellitus;dyslipidemia;hypertension;male gender;obesity (BMI >30kg/m2) Sleep patterns: gets up 1-2 times nightly to void and sleeps 6-7 hours nightly. Patient reports insomnia issues, discussed recommended sleep tips and stress reduction tips.  Home Safety/Smoke Alarms: Feels safe in home. Smoke alarms in place.  Living environment; residence and Firearm Safety: 2-story house, no firearms. Lives with wife no needs for DME, good support system Seat Belt Safety/Bike Helmet: Wears seat belt.   Objective:    Today's Vitals   04/19/17 1015  BP: (!) 144/88  Pulse: 63  Resp: 20  SpO2: 98%  Weight: 217 lb (98.4 kg)  Height: 5\' 6"  (1.676 m)  PainSc: 8    Body mass index is 35.02 kg/m.  Current Medications (verified) Outpatient Encounter Medications as of 04/19/2017  Medication Sig  . acetaminophen (TYLENOL) 500 MG tablet Take 2 tablets (1,000 mg total) by mouth every 8 (eight) hours as needed.  Marland Kitchen alendronate (FOSAMAX) 70 MG tablet Take 70 mg by mouth every Monday.  Marland Kitchen allopurinol (ZYLOPRIM) 300 MG tablet Take 300 mg by mouth every evening.   Marland Kitchen atorvastatin (LIPITOR) 20 MG tablet Take 1 tablet (20 mg total) by mouth daily.  . Biotin 5000 MCG TABS Take 5,000 mcg by mouth daily.  . brimonidine (ALPHAGAN) 0.15 % ophthalmic solution Place 1 drop into the left eye 2 (two) times daily.   . Calcium Lactate 750 MG TABS Take 1 tablet by mouth daily.   . Cholecalciferol (VITAMIN D3 PO) Take 1,000 mg by mouth daily.  . colchicine (COLCRYS) 0.6 MG tablet Take 0.6 mg by mouth daily as needed (flareups).   . diltiazem (CARDIZEM CD) 240 MG 24 hr capsule Take 1 capsule (240 mg total) by mouth daily.  Marland Kitchen  doxazosin (CARDURA) 8 MG tablet Take 1 tablet (8 mg total) by mouth daily.  . DULoxetine (CYMBALTA) 30 MG capsule Take 60 mg by mouth daily.   Marland Kitchen ELIQUIS 5 MG TABS tablet TAKE 1 TABLET BY MOUTH TWO  TIMES DAILY  . latanoprost (XALATAN) 0.005 % ophthalmic solution Place 1 drop into both eyes at bedtime.  . levalbuterol (XOPENEX HFA) 45 MCG/ACT inhaler Inhale 1-2 puffs into the lungs every 4 (four) hours as needed for wheezing.  Marland Kitchen levothyroxine (SYNTHROID, LEVOTHROID) 100 MCG tablet Take 1 tablet (100 mcg total) by mouth daily.  . magnesium oxide (MAG-OX) 400 MG tablet Take 400 mg by mouth daily.  . mirabegron ER (MYRBETRIQ) 25 MG TB24 tablet Take 25 mg by mouth daily.  . Multiple Vitamin (MULTIVITAMIN) tablet Take 1 tablet by mouth daily.  . mupirocin cream (BACTROBAN) 2 % Apply 1 application topically 2 (two) times daily.  . nitroGLYCERIN (NITROSTAT) 0.4 MG SL tablet Place 1 tablet (0.4 mg total) under the tongue every 5 (five) minutes as needed for chest pain.  . NON FORMULARY Inhale 2 L into the lungs at bedtime. CPAP with O2 2L  . omeprazole (PRILOSEC) 20 MG capsule Take 20 mg by mouth daily.  Marland Kitchen oxyCODONE (OXY IR/ROXICODONE) 5 MG immediate release tablet Take 5 mg by mouth every 4 (four) hours as needed for severe pain.  Marland Kitchen oxyCODONE (OXYCONTIN) 10 mg 12 hr tablet Take 10 mg by mouth every 12 (twelve) hours as needed.  Marland Kitchen  oxyCODONE (ROXICODONE) 15 MG immediate release tablet Take 15 mg by mouth every 4 (four) hours as needed for pain.  . potassium gluconate (EQL POTASSIUM GLUCONATE) 595 (99 K) MG TABS tablet Take 595 mg by mouth daily.  . pregabalin (LYRICA) 100 MG capsule Take 1 tablet in AM, Take 2 tablet in PM  . tadalafil (CIALIS) 20 MG tablet Take 1 tablet (20 mg total) by mouth daily as needed. For erectile dysfunction  . Tiotropium Bromide-Olodaterol (STIOLTO RESPIMAT) 2.5-2.5 MCG/ACT AERS Inhale 2 puffs into the lungs daily.  Marland Kitchen tiZANidine (ZANAFLEX) 4 MG capsule Take 1 capsule (4 mg  total) by mouth at bedtime.  . vitamin C (ASCORBIC ACID) 500 MG tablet Take 1,000 mg by mouth daily.   . zaleplon (SONATA) 10 MG capsule Take 1 capsule (10 mg total) by mouth at bedtime as needed for sleep.  Marland Kitchen Zoster Vaccine Adjuvanted [facility]) injection   . hydrochlorothiazide (HYDRODIURIL) 25 MG tablet Take 1 tablet (25 mg total) by mouth daily.   No facility-administered encounter medications on file as of 04/19/2017.     Allergies (verified) Patient has no known allergies.   History: Past Medical History:  Diagnosis Date  . Atrial fibrillation (HCC)    a. paroxysmal, on Eliquis for anticoagulation  . BPH (benign prostatic hyperplasia)   . Bronchitis   . Cataract   . COPD (chronic obstructive pulmonary disease) (HCC)    2 liters O2 HS  . Depression   . Fatty tumor    waste and back  . Fibromyalgia   . GERD (gastroesophageal reflux disease)   . Glaucoma    bilateral eyes  . Gout   . Hereditary and idiopathic peripheral neuropathy 08/28/2015  . Hypertension   . Hypothyroidism   . Hypoxia   . Insomnia   . Memory disorder 08/28/2015  . On home oxygen therapy    at night with cpap  . Osteoarthritis   . Osteoporosis   . Peripheral neuropathy   . Rotator cuff tear, left   . Sleep apnea    uses cpap-add oxygen at night  . Spinal compression fracture (HCC) seventh vertebre  . Transient alteration of awareness 08/28/2015  . Type 2 diabetes mellitus ([place]) 03/08/2016  . Wears glasses    Past Surgical History:  Procedure Laterality Date  . APPENDECTOMY  age 6  . CARPAL TUNNEL RELEASE Right    early 2000s  . CATARACT EXTRACTION     bilateral  . CHOLECYSTECTOMY  age 24  . EYE SURGERY    . INGUINAL HERNIA REPAIR  age 44   rt side  . NASAL CONCHA BULLOSA RESECTION  age 60  . PROSTATE SURGERY    . SHOULDER ARTHROSCOPY W/ ROTATOR CUFF REPAIR Right    early 2000s  . tonsil    . VASECTOMY  age 6   Family History  Problem Relation Age of Onset  . Coronary artery  disease Brother   . Heart disease Father   . Lung cancer Father   . Kidney cancer Father   . Prostate cancer Father   . Arthritis Mother   . Lung cancer Mother    Social History   Occupational History  . Occupation: RETIRED    Employer: RETIRED  Tobacco Use  . Smoking status: Former Smoker    Packs/day: 2.00    Years: 10.00    Pack years: 20.00    Types: Cigarettes    Last attempt to quit: 07/18/1975    Years since quitting: 41.7  .  Smokeless tobacco: Never Used  Substance and Sexual Activity  . Alcohol use: Yes    Comment: 1-2 drinks/day  . Drug use: No  . Sexual activity: Not on file   Tobacco Counseling Counseling given: Not Answered   Activities of Daily Living In your present state of health, do you have any difficulty performing the following activities: 04/19/2017 01/12/2017  Hearing? Y N  Vision? N N  Difficulty concentrating or making decisions? N N  Walking or climbing stairs? N N  Dressing or bathing? N N  Doing errands, shopping? N N  Preparing Food and eating ? N -  Using the Toilet? N -  In the past six months, have you accidently leaked urine? N -  Do you have problems with loss of bowel control? N -  Managing your Medications? N -  Managing your Finances? N -  Housekeeping or managing your Housekeeping? N -  Some recent data might be hidden    Immunizations and Health Maintenance Immunization History  Administered Date(s) Administered  . Hepatitis A 07/25/2007, 01/22/2008  . Hepatitis B 04/30/1988, 06/02/1988, 10/28/1988  . IPV 05/15/1996  . Influenza Split 02/19/2012, 03/13/2016  . Influenza Whole 02/17/2011, 03/13/2013  . Influenza, High Dose Seasonal PF 03/10/2017  . Influenza,inj,Quad PF,6+ Mos 03/15/2015  . Meningococcal Polysaccharide 05/15/1996  . Pneumococcal Conjugate-13 12/08/2016  . Pneumococcal-Unspecified 02/01/2006  . Td 01/14/1995  . Zoster 02/01/2006  . Zoster Recombinat (Shingrix) 01/15/2017   Health Maintenance Due    Topic Date Due  . FOOT EXAM  06/07/1943  . OPHTHALMOLOGY EXAM  06/07/1945  . TETANUS/TDAP  01/13/2005    Patient Care Team: Binnie Rail, MD as PCP - General (Internal Medicine) Council Mechanic, MD as Referring Physician (Family Medicine)  Indicate any recent Medical Services you may have received from other than [facility] providers in the past year (date may be approximate).    Assessment:   This is a routine wellness examination for [place]. Physical assessment deferred to PCP.   Hearing/Vision screen Hearing Screening Comments: HOH, has hearing aids Vision Screening Comments: appointment every 6 months Dr. Posey Pronto  Dietary issues and exercise activities discussed: Current Exercise Habits: Home exercise routine, Exercise limited by: orthopedic condition(s) Diet (meal preparation, eat out, water intake, caffeinated beverages, dairy products, fruits and vegetables): in general, a "healthy" diet  , well balanced  Reviewed heart healthy and diabetic diet, encouraged patient to increase daily water intake. Discussed weight loss tips.  Diet education was provided via handout.  Goals    None     Depression Screen PHQ 2/9 Scores 04/19/2017 01/14/2017 01/12/2017 09/07/2016  PHQ - 2 Score 2 0 0 0  PHQ- 9 Score 8 - - -    Fall Risk Fall Risk  04/19/2017 01/14/2017 01/12/2017 09/07/2016  Falls in the past year? No No No No    Cognitive Function: MMSE - Mini Mental State Exam 04/19/2017 08/28/2015  Not completed: Refused -  Orientation to time - 5  Orientation to Place - 5  Registration - 3  Attention/ Calculation - 5  Recall - 2  Language- name 2 objects - 2  Language- repeat - 1  Language- follow 3 step command - 3  Language- read & follow direction - 1  Write a sentence - 1  Copy design - 1  Total score - 29       Ad8 score reviewed for issues:  Issues making decisions: no  Less interest in hobbies / activities: no  Repeats  questions, stories (family complaining):  no  Trouble using ordinary gadgets (microwave, computer, phone):no  Forgets the month or year: no  Mismanaging finances: no  Remembering appts: no  Daily problems with thinking and/or memory: no Ad8 score is= 0  Screening Tests Health Maintenance  Topic Date Due  . FOOT EXAM  06[birth year]07/1943  . OPHTHALMOLOGY EXAM  06/07/1945  . TETANUS/TDAP  01/13/2005  . URINE MICROALBUMIN  12/08/2017  . INFLUENZA VACCINE  Completed  . PNA vac Low Risk Adult  Completed        Plan:    Continue doing brain stimulating activities (puzzles, reading, adult coloring books, staying active) to keep memory sharp.   Continue to eat heart healthy diet (full of fruits, vegetables, whole grains, lean protein, water--limit salt, fat, and sugar intake) and increase physical activity as tolerated.  I have personally reviewed and noted the following in the patient's chart:   . Medical and social history . Use of alcohol, tobacco or illicit drugs  . Current medications and supplements . Functional ability and status . Nutritional status . Physical activity . Advanced directives . List of other physicians . Vitals . Screenings to include cognitive, depression, and falls . Referrals and appointments  In addition, I have reviewed and discussed with patient certain preventive protocols, quality metrics, and best practice recommendations. A written personalized care plan for preventive services as well as general preventive health recommendations were provided to patient.     Michiel Cowboy, RN   04/19/2017      Medical screening examination/treatment/procedure(s) were performed by non-physician practitioner and as supervising physician I was immediately available for consultation/collaboration. I agree with above. Binnie Rail, MD

## 2017-04-19 ENCOUNTER — Ambulatory Visit (INDEPENDENT_AMBULATORY_CARE_PROVIDER_SITE_OTHER): Payer: Medicare Other | Admitting: *Deleted

## 2017-04-19 ENCOUNTER — Telehealth: Payer: Self-pay | Admitting: *Deleted

## 2017-04-19 VITALS — BP 144/88 | HR 63 | Resp 20 | Ht 66.0 in | Wt 217.0 lb

## 2017-04-19 DIAGNOSIS — Z0000 Encounter for general adult medical examination without abnormal findings: Secondary | ICD-10-CM | POA: Diagnosis not present

## 2017-04-19 DIAGNOSIS — E119 Type 2 diabetes mellitus without complications: Secondary | ICD-10-CM

## 2017-04-19 NOTE — Telephone Encounter (Signed)
We can try increasing cymbalta to 90 mg daily or we can switch to effexor -- both help with pain in addition to helping with depression

## 2017-04-19 NOTE — Patient Instructions (Addendum)
Jonathan Goody MD [facility] [place], [place] Address: [address], [place], [place] [address] Hours:  Open ? Closes 5PM Phone: [phone]  Continue doing brain stimulating activities (puzzles, reading, adult coloring books, staying active) to keep memory sharp.   Continue to eat heart healthy diet (full of fruits, vegetables, whole grains, lean protein, water--limit salt, fat, and sugar intake) and increase physical activity as tolerated.   Jonathan [name] , Thank you for taking time to come for your Medicare Wellness Visit. I appreciate your ongoing commitment to your health goals. Please review the following plan we discussed and let me know if I can assist you in the future.   These are the goals we discussed:   This is a list of the screening recommended for you and due dates:  Health Maintenance  Topic Date Due  . Complete foot exam   06[birth year]07/1943  . Eye exam for diabetics  06/07/1945  . Tetanus Vaccine  01/13/2005  . Urine Protein Check  12/08/2017  . Flu Shot  Completed  . Pneumonia vaccines  Completed

## 2017-04-19 NOTE — Addendum Note (Signed)
Addended by: Emelia Loron A on: 04/19/2017 05:20 PM   Modules accepted: Orders

## 2017-04-19 NOTE — Telephone Encounter (Addendum)
During AWV, patient stated he needed refills for zaleplon, diltiazem, and duloxetine. Patient shared that he feels  the duloxetine is not as effective as it should be and asked if there is another antidepressant that may work better but is one that is covered by Medicare as he cannot afford one that is too expensive. He would like for the prescriptions to be sent to [facility] mail service.

## 2017-04-20 NOTE — Telephone Encounter (Signed)
LVM for pt to call back and discuss.  

## 2017-04-21 ENCOUNTER — Encounter: Payer: Self-pay | Admitting: Internal Medicine

## 2017-04-21 MED ORDER — DULOXETINE HCL 30 MG PO CPEP: ORAL_CAPSULE | ORAL | 5 refills | Status: DC

## 2017-04-21 NOTE — Telephone Encounter (Signed)
Spoke with pt, he would like to try increasing the Cymbalta. Please send RX to [facility] [address]

## 2017-04-22 MED ORDER — DULOXETINE HCL 30 MG PO CPEP: ORAL_CAPSULE | ORAL | 1 refills | Status: DC

## 2017-04-22 MED ORDER — MIRABEGRON ER 25 MG PO TB24: 25.0000 mg | ORAL_TABLET | Freq: Every day | ORAL | 1 refills | Status: DC

## 2017-04-22 MED ORDER — ZALEPLON 10 MG PO CAPS: 10.0000 mg | ORAL_CAPSULE | Freq: Every evening | ORAL | 0 refills | Status: DC | PRN

## 2017-04-23 NOTE — Telephone Encounter (Signed)
[place] faxed to [facility]

## 2017-04-26 ENCOUNTER — Encounter: Payer: Self-pay | Admitting: Cardiology

## 2017-04-26 ENCOUNTER — Ambulatory Visit (INDEPENDENT_AMBULATORY_CARE_PROVIDER_SITE_OTHER): Payer: Medicare Other | Admitting: Cardiology

## 2017-04-26 VITALS — BP 128/60 | HR 98 | Ht 66.5 in | Wt 216.0 lb

## 2017-04-26 DIAGNOSIS — I480 Paroxysmal atrial fibrillation: Secondary | ICD-10-CM | POA: Diagnosis not present

## 2017-04-26 DIAGNOSIS — J449 Chronic obstructive pulmonary disease, unspecified: Secondary | ICD-10-CM

## 2017-04-26 MED ORDER — DILTIAZEM HCL ER COATED BEADS 300 MG PO CP24: 300.0000 mg | ORAL_CAPSULE | Freq: Every day | ORAL | 3 refills | Status: DC

## 2017-04-26 NOTE — Patient Instructions (Signed)
Medication Instructions:   INCREASE DILTIAZEM TO 300 MG ONCE DAILY  Follow-Up:  Your physician recommends that you schedule a follow-up appointment in: [place]   If you need a refill on your cardiac medications before your next appointment, please call your pharmacy.

## 2017-04-28 ENCOUNTER — Telehealth: Payer: Self-pay | Admitting: Emergency Medicine

## 2017-04-28 NOTE — Telephone Encounter (Signed)
PA completed for zaleplon. Key [id number]    Awaiting response.

## 2017-04-30 ENCOUNTER — Ambulatory Visit: Payer: Medicare Other | Admitting: Cardiology

## 2017-05-03 NOTE — Telephone Encounter (Signed)
PA approved through 04/28/2018, Faxed approval to pharmacy.

## 2017-05-13 DIAGNOSIS — M47817 Spondylosis without myelopathy or radiculopathy, lumbosacral region: Secondary | ICD-10-CM | POA: Diagnosis not present

## 2017-05-22 ENCOUNTER — Encounter: Payer: Self-pay | Admitting: Internal Medicine

## 2017-05-22 ENCOUNTER — Other Ambulatory Visit: Payer: Self-pay | Admitting: Internal Medicine

## 2017-05-23 ENCOUNTER — Encounter: Payer: Self-pay | Admitting: Internal Medicine

## 2017-05-23 MED ORDER — PREGABALIN 100 MG PO CAPS: ORAL_CAPSULE | ORAL | 0 refills | Status: DC

## 2017-05-23 MED ORDER — PREGABALIN 100 MG PO CAPS: ORAL_CAPSULE | ORAL | 1 refills | Status: DC

## 2017-05-23 NOTE — Telephone Encounter (Signed)
Sent both

## 2017-05-27 DIAGNOSIS — M47817 Spondylosis without myelopathy or radiculopathy, lumbosacral region: Secondary | ICD-10-CM | POA: Diagnosis not present

## 2017-06-03 ENCOUNTER — Other Ambulatory Visit: Payer: Self-pay | Admitting: Emergency Medicine

## 2017-06-03 ENCOUNTER — Other Ambulatory Visit: Payer: Self-pay | Admitting: Internal Medicine

## 2017-06-03 MED ORDER — TIZANIDINE HCL 4 MG PO CAPS: 4.0000 mg | ORAL_CAPSULE | Freq: Every day | ORAL | 1 refills | Status: DC

## 2017-06-10 ENCOUNTER — Other Ambulatory Visit: Payer: Self-pay | Admitting: Emergency Medicine

## 2017-06-10 DIAGNOSIS — M810 Age-related osteoporosis without current pathological fracture: Secondary | ICD-10-CM

## 2017-06-10 MED ORDER — LEVOTHYROXINE SODIUM 100 MCG PO TABS: 100.0000 ug | ORAL_TABLET | Freq: Every day | ORAL | 1 refills | Status: DC

## 2017-06-10 MED ORDER — ALLOPURINOL 300 MG PO TABS: 300.0000 mg | ORAL_TABLET | Freq: Every evening | ORAL | 1 refills | Status: DC

## 2017-06-10 NOTE — Telephone Encounter (Signed)
Please advise on refill.

## 2017-06-10 NOTE — Telephone Encounter (Signed)
Please call him - I have not prescribed this in the past for him  How long has he been on it? When was his last dexa?

## 2017-06-11 MED ORDER — ALENDRONATE SODIUM 70 MG PO TABS: 70.0000 mg | ORAL_TABLET | ORAL | 1 refills | Status: DC

## 2017-06-11 NOTE — Telephone Encounter (Signed)
Pt states he has been taking medication since he was seeing [facility]. He has a Bone Density around 2 years ago, I do not see the report in his chart. He is currently taking the medication once weekly.

## 2017-06-11 NOTE — Telephone Encounter (Signed)
He should not be on the medication for more than 7 years.  He is due for a dexa scan.  I will order one.  If he has been on the medication for less than 7 years ok to continue for now, but depending on bone density results we may want to stop it.

## 2017-06-11 NOTE — Telephone Encounter (Signed)
Notified pt via My Chart.

## 2017-06-18 DIAGNOSIS — H16103 Unspecified superficial keratitis, bilateral: Secondary | ICD-10-CM | POA: Diagnosis not present

## 2017-06-18 DIAGNOSIS — H35373 Puckering of macula, bilateral: Secondary | ICD-10-CM | POA: Diagnosis not present

## 2017-06-18 DIAGNOSIS — H52203 Unspecified astigmatism, bilateral: Secondary | ICD-10-CM | POA: Diagnosis not present

## 2017-06-18 DIAGNOSIS — H401131 Primary open-angle glaucoma, bilateral, mild stage: Secondary | ICD-10-CM | POA: Diagnosis not present

## 2017-06-22 ENCOUNTER — Telehealth: Payer: Self-pay | Admitting: Emergency Medicine

## 2017-06-22 NOTE — Telephone Encounter (Signed)
It is in Dr. Agustina Caroli CMN folder and I will give it to him when I get to the [facility] office

## 2017-06-22 NOTE — Telephone Encounter (Signed)
Called [facility] to speak with [name], She is asking did we receive a detailed written order for pt. Jonathan Piety do you have this?

## 2017-06-25 NOTE — Telephone Encounter (Signed)
This CMN was signed by Dr. Lamonte Sakai on 06/24/2017 and I faxed it to [facility] 06/25/2017 and rec'd confirmation that it went through

## 2017-07-01 DIAGNOSIS — M47814 Spondylosis without myelopathy or radiculopathy, thoracic region: Secondary | ICD-10-CM | POA: Diagnosis not present

## 2017-07-03 ENCOUNTER — Encounter: Payer: Self-pay | Admitting: Cardiology

## 2017-07-03 ENCOUNTER — Encounter: Payer: Self-pay | Admitting: Internal Medicine

## 2017-07-13 DIAGNOSIS — N401 Enlarged prostate with lower urinary tract symptoms: Secondary | ICD-10-CM | POA: Diagnosis not present

## 2017-07-13 DIAGNOSIS — R3915 Urgency of urination: Secondary | ICD-10-CM | POA: Diagnosis not present

## 2017-07-13 DIAGNOSIS — N5201 Erectile dysfunction due to arterial insufficiency: Secondary | ICD-10-CM | POA: Diagnosis not present

## 2017-07-14 DIAGNOSIS — M19049 Primary osteoarthritis, unspecified hand: Secondary | ICD-10-CM | POA: Insufficient documentation

## 2017-07-14 DIAGNOSIS — M19041 Primary osteoarthritis, right hand: Secondary | ICD-10-CM | POA: Diagnosis not present

## 2017-07-14 HISTORY — DX: Primary osteoarthritis, unspecified hand: M19.049

## 2017-07-23 ENCOUNTER — Other Ambulatory Visit: Payer: Medicare Other

## 2017-07-23 ENCOUNTER — Ambulatory Visit (INDEPENDENT_AMBULATORY_CARE_PROVIDER_SITE_OTHER)
Admission: RE | Admit: 2017-07-23 | Discharge: 2017-07-23 | Disposition: A | Discharge status: Home / Self Care | Payer: Medicare Other | Source: Ambulatory Visit | Attending: Internal Medicine | Admitting: Internal Medicine

## 2017-07-23 ENCOUNTER — Ambulatory Visit (INDEPENDENT_AMBULATORY_CARE_PROVIDER_SITE_OTHER): Payer: Medicare Other | Admitting: Internal Medicine

## 2017-07-23 ENCOUNTER — Encounter: Payer: Self-pay | Admitting: Internal Medicine

## 2017-07-23 ENCOUNTER — Telehealth: Payer: Self-pay | Admitting: Emergency Medicine

## 2017-07-23 VITALS — BP 122/66 | HR 90 | Temp 97.9°F | Ht 66.0 in | Wt 208.8 lb

## 2017-07-23 DIAGNOSIS — J441 Chronic obstructive pulmonary disease with (acute) exacerbation: Secondary | ICD-10-CM

## 2017-07-23 DIAGNOSIS — R05 Cough: Secondary | ICD-10-CM | POA: Diagnosis not present

## 2017-07-23 MED ORDER — DOXYCYCLINE HYCLATE 100 MG PO TABS: 100.0000 mg | ORAL_TABLET | Freq: Two times a day (BID) | ORAL | 0 refills | Status: DC

## 2017-07-23 NOTE — Telephone Encounter (Signed)
[facility] has an opening at 3:45 this afternoon.. Called pt to schedule at this visit.  Nothing further needed.

## 2017-07-23 NOTE — Patient Instructions (Addendum)
Doxy 100 mg twice daily x 10 days  For cough > either mucinex or mucinex  Up to 1200 mg every 12 hours  As needed    Please remember to go to the  x-ray department downstairs in the basement  for your tests - we will call you with the results when they are available.      Call if not improved first of the week

## 2017-07-23 NOTE — Progress Notes (Signed)
Subjective:     Patient ID: Jonathan [name], male   DOB: 06/11/1933,   MRN: [id number]  HPI  34 yowm  Quit smoking 1977 with documented GOLD II copd  and pattern of recurrent pna x 2005    07/23/2017 acute extended ov/Suede [place] re: ? Uri vs pna Chief Complaint  Patient presents with  . Acute Visit    coughing up green mucus, slight fever (99.3)  at home  maint on stiolto 2 puffs each pm  Best days = MMRC2 = can't walk a nl pace on a flat grade s sob but does fine slow    Acutely ill 07/22/17 with cough green s antecendent uri/ no cp / no rigors  No change sob from baseline nor need for saba since onset of cough    also hoarse / mild sore throat    No obvious day to day or daytime variability or assoc mucus plugs or hemoptysis or cp or chest tightness, subjective wheeze or overt sinus or hb symptoms. No unusual exposure hx or h/o childhood pna/ asthma or knowledge of premature birth.  Sleeping ok flat without nocturnal  or early am exacerbation  of respiratory  c/o's or need for noct saba. Also denies any obvious fluctuation of symptoms with weather or environmental changes or other aggravating or alleviating factors except as outlined above   Current Allergies, Complete Past Medical History, Past Surgical History, Family History, and Social History were reviewed in [employer] record.  ROS  The following are not active complaints unless bolded Hoarseness, sore throat, dysphagia, dental problems, itching, sneezing,  nasal congestion or discharge of excess mucus or purulent secretions, ear ache,   fever, chills, sweats, unintended wt loss or wt gain, classically pleuritic or exertional cp,  orthopnea pnd or leg swelling, presyncope, palpitations, abdominal pain, anorexia, nausea, vomiting, diarrhea  or change in bowel habits or change in bladder habits, change in stools or change in urine, dysuria, hematuria,  rash, arthralgias, visual complaints, headache, numbness, weakness  or ataxia or problems with walking or coordination,  change in mood/affect or memory.        Current Meds  Medication Sig  . acetaminophen (TYLENOL) 500 MG tablet Take 2 tablets (1,000 mg total) by mouth every 8 (eight) hours as needed.  Marland Kitchen alendronate (FOSAMAX) 70 MG tablet Take 1 tablet (70 mg total) by mouth every Monday.  Marland Kitchen allopurinol (ZYLOPRIM) 300 MG tablet Take 1 tablet (300 mg total) by mouth every evening.  Marland Kitchen atorvastatin (LIPITOR) 20 MG tablet Take 1 tablet (20 mg total) by mouth daily.  . Biotin 5000 MCG TABS Take 5,000 mcg by mouth daily.  . brimonidine (ALPHAGAN) 0.15 % ophthalmic solution Place 1 drop into the left eye 2 (two) times daily.   . Calcium Lactate 750 MG TABS Take 1 tablet by mouth daily.   . Cholecalciferol (VITAMIN D3 PO) Take 1,000 mg by mouth daily.  . colchicine (COLCRYS) 0.6 MG tablet Take 0.6 mg by mouth daily as needed (flareups).   . diltiazem (CARDIZEM CD) 300 MG 24 hr capsule Take 1 capsule (300 mg total) daily by mouth.  . doxazosin (CARDURA) 8 MG tablet TAKE 1 TABLET BY MOUTH  DAILY  . DULoxetine HCl (CYMBALTA PO) Take 90 mg daily by mouth.  Arne Cleveland 5 MG TABS tablet TAKE 1 TABLET BY MOUTH TWO  TIMES DAILY  . latanoprost (XALATAN) 0.005 % ophthalmic solution Place 1 drop into both eyes at bedtime.  . levalbuterol [facility] HFA)  45 MCG/ACT inhaler Inhale 1-2 puffs into the lungs every 4 (four) hours as needed for wheezing.  Marland Kitchen levothyroxine (SYNTHROID, LEVOTHROID) 100 MCG tablet Take 1 tablet (100 mcg total) by mouth daily.  . magnesium oxide (MAG-OX) 400 MG tablet Take 400 mg by mouth daily.  . mirabegron ER (MYRBETRIQ) 25 MG TB24 tablet Take 1 tablet (25 mg total) daily by mouth. (Patient taking differently: Take 50 mg by mouth daily. )  . Multiple Vitamin (MULTIVITAMIN) tablet Take 1 tablet by mouth daily.  . mupirocin cream (BACTROBAN) 2 % Apply 1 application topically 2 (two) times daily.  . nitroGLYCERIN (NITROSTAT) 0.4 MG SL tablet Place 1 tablet  (0.4 mg total) under the tongue every 5 (five) minutes as needed for chest pain.  . NON FORMULARY Inhale 2 L into the lungs at bedtime. CPAP with O2 2L  . omeprazole (PRILOSEC) 20 MG capsule Take 20 mg by mouth daily.  Marland Kitchen oxyCODONE (OXY IR/ROXICODONE) 5 MG immediate release tablet Take 5 mg by mouth every 4 (four) hours as needed for severe pain.  Marland Kitchen oxyCODONE (OXYCONTIN) 10 mg 12 hr tablet Take 10 mg by mouth every 12 (twelve) hours as needed.  Marland Kitchen oxyCODONE (ROXICODONE) 15 MG immediate release tablet Take 15 mg by mouth every 4 (four) hours as needed for pain.  . potassium gluconate (EQL POTASSIUM GLUCONATE) 595 (99 K) MG TABS tablet Take 595 mg by mouth daily.  . pregabalin (LYRICA) 100 MG capsule Take 1 tablet in AM, Take 2 tablet in PM  . tadalafil (CIALIS) 20 MG tablet Take 1 tablet (20 mg total) by mouth daily as needed. For erectile dysfunction  . Tiotropium Bromide-Olodaterol (STIOLTO RESPIMAT) 2.5-2.5 MCG/ACT AERS Inhale 2 puffs into the lungs daily.  Marland Kitchen tiZANidine (ZANAFLEX) 4 MG capsule Take 1 capsule (4 mg total) by mouth at bedtime.  . vitamin C (ASCORBIC ACID) 500 MG tablet Take 1,000 mg by mouth daily.   . zaleplon (SONATA) 10 MG capsule Take 1 capsule (10 mg total) at bedtime as needed by mouth for sleep.  Marland Kitchen Zoster Vaccine Adjuvanted [facility]) injection      .        Review of Systems     Objective:   Physical Exam   amb mod obese wm gruff voice    Wt Readings from Last 3 Encounters:  07/23/17 208 lb 12.8 oz (94.7 kg)  04/26/17 216 lb (98 kg)  04/19/17 217 lb (98.4 kg)     Vital signs reviewed - Note on arrival 02 sats  94% on RA  HEENT: nl dentition,  and oropharynx. Nl external ear canals without cough reflex - moderate bilateral non-specific turbinate edema  mp secretions R > L   NECK :  without JVD/Nodes/TM/ nl carotid upstrokes bilaterally   LUNGS: no acc muscle use,  Nl contour chest with minimal insp/exp rhonchi bilaterally no bronchial changes/ mild  cough with exp maneuvers    CV:  RRR  no s3 or murmur or increase in P2, and no edema   ABD:  soft and nontender with nl inspiratory excursion in the supine position. No bruits or organomegaly appreciated, bowel sounds nl  MS:  Nl gait/ ext warm without deformities, calf tenderness, cyanosis or clubbing No obvious joint restrictions   SKIN: warm and dry without lesions    NEURO:  alert, approp, nl sensorium with  no motor or cerebellar deficits apparent.     A few insp rhonchi both bases      CXR  PA and Lateral:   07/23/2017 :    I personally reviewed images and agree with radiology impression as follows:   Mild bibasilar atelectasis. No edema or consolidation. Heart size normal. There is aortic atherosclerosis.       Assessment:

## 2017-07-25 ENCOUNTER — Encounter: Payer: Self-pay | Admitting: Internal Medicine

## 2017-07-25 NOTE — Assessment & Plan Note (Addendum)
Spirometry  05/05/11   FEV1 2.27 (75%)  Ratio 63 p ? Prior to study with 6% resp to saba    Mild flare AB in setting of GOLD II copd s evidence of pna  rec Doxy x 10 days mucinex dm Prn xopenex No change maint rx but advised best to use stiolto fist thing in am instead of each pm    I had an extended discussion with the patient reviewing all relevant studies completed to date and  lasting 15 to 20 minutes of a 25 minute acute visit  With pt new to me   Each maintenance medication was reviewed in detail including most importantly the difference between maintenance and prns and under what circumstances the prns are to be triggered using an action plan format that is not reflected in the computer generated alphabetically organized AVS.    Please see AVS for specific instructions unique to this visit that I personally wrote and verbalized to the the pt in detail and then reviewed with pt  by my nurse highlighting any  changes in therapy recommended at today's visit to their plan of care.

## 2017-07-26 NOTE — Progress Notes (Signed)
Spoke with pt and notified of results per Dr. Wert. Pt verbalized understanding and denied any questions. 

## 2017-07-28 NOTE — Progress Notes (Signed)
HPI: FU atrial fibrillation. Previously with a fib but converted spontaneously to sinus rhythm. Note the patient had an echocardiogram in [place] in November of 2012 that showed an ejection fraction of 59% with no valvular abnormalities. Patient also with significant COPD. Abdominal ultrasound March 2015 showed no aneurysm. Nuclear study July 2016 showed ejection fraction 56% and normal perfusion. Patient was seen recently in the office for what he thought was paroxysmal atrial fibrillation. He was in sinus rhythm at that time. His Cardizem was increased. Echocardiogram repeated August 2018 and showed normal LV function, grade 2 diastolic dysfunction and mild left atrial enlargement. Monitor August 2018 showed sinus rhythm with PACs, PVCs and PAF. Since I last saw him, the patient has dyspnea with more extreme activities but not with routine activities. It is relieved with rest. It is not associated with chest pain. There is no orthopnea, PND or pedal edema. There is no syncope or palpitations. There is no exertional chest pain.   Current Outpatient Medications  Medication Sig Dispense Refill  . acetaminophen (TYLENOL) 500 MG tablet Take 2 tablets (1,000 mg total) by mouth every 8 (eight) hours as needed. 360 tablet 1  . alendronate (FOSAMAX) 70 MG tablet Take 1 tablet (70 mg total) by mouth every Monday. 12 tablet 1  . allopurinol (ZYLOPRIM) 300 MG tablet Take 1 tablet (300 mg total) by mouth every evening. 90 tablet 1  . atorvastatin (LIPITOR) 20 MG tablet Take 1 tablet (20 mg total) by mouth daily. 90 tablet 3  . Biotin 5000 MCG TABS Take 5,000 mcg by mouth daily.    . brimonidine (ALPHAGAN) 0.15 % ophthalmic solution Place 1 drop into the left eye 2 (two) times daily.     . Calcium Lactate 750 MG TABS Take 1 tablet by mouth daily.     . colchicine (COLCRYS) 0.6 MG tablet Take 0.6 mg by mouth daily as needed (flareups).     . diltiazem (CARDIZEM CD) 300 MG 24 hr capsule Take 1 capsule  (300 mg total) daily by mouth. 90 capsule 3  . doxazosin (CARDURA) 8 MG tablet TAKE 1 TABLET BY MOUTH  DAILY 90 tablet 1  . DULoxetine HCl (CYMBALTA PO) Take 90 mg daily by mouth.    Arne Cleveland 5 MG TABS tablet TAKE 1 TABLET BY MOUTH TWO  TIMES DAILY 180 tablet 1  . latanoprost (XALATAN) 0.005 % ophthalmic solution Place 1 drop into both eyes at bedtime. 7.5 mL 3  . levalbuterol (XOPENEX HFA) 45 MCG/ACT inhaler Inhale 1-2 puffs into the lungs every 4 (four) hours as needed for wheezing. 1 Inhaler 12  . levothyroxine (SYNTHROID, LEVOTHROID) 100 MCG tablet Take 1 tablet (100 mcg total) by mouth daily. 90 tablet 1  . magnesium oxide (MAG-OX) 400 MG tablet Take 400 mg by mouth daily.    . mirabegron ER (MYRBETRIQ) 25 MG TB24 tablet Take 1 tablet (25 mg total) daily by mouth. (Patient taking differently: Take 50 mg by mouth daily. ) 90 tablet 1  . Multiple Vitamin (MULTIVITAMIN) tablet Take 1 tablet by mouth daily.    . mupirocin cream (BACTROBAN) 2 % Apply 1 application topically 2 (two) times daily. 15 g 0  . nitroGLYCERIN (NITROSTAT) 0.4 MG SL tablet Place 1 tablet (0.4 mg total) under the tongue every 5 (five) minutes as needed for chest pain. 90 tablet 3  . omeprazole (PRILOSEC) 20 MG capsule Take 20 mg by mouth daily.    Marland Kitchen oxyCODONE (OXY IR/ROXICODONE)  5 MG immediate release tablet Take 5 mg by mouth every 4 (four) hours as needed for severe pain.    Marland Kitchen oxyCODONE (OXYCONTIN) 10 mg 12 hr tablet Take 10 mg by mouth every 12 (twelve) hours as needed.    Marland Kitchen oxyCODONE (ROXICODONE) 15 MG immediate release tablet Take 15 mg by mouth every 4 (four) hours as needed for pain.    . potassium gluconate (EQL POTASSIUM GLUCONATE) 595 (99 K) MG TABS tablet Take 595 mg by mouth daily.    . tadalafil (CIALIS) 20 MG tablet Take 1 tablet (20 mg total) by mouth daily as needed. For erectile dysfunction 10 tablet 1  . Tiotropium Bromide-Olodaterol (STIOLTO RESPIMAT) 2.5-2.5 MCG/ACT AERS Inhale 2 puffs into the lungs  daily. 3 Inhaler 3  . tiZANidine (ZANAFLEX) 4 MG capsule Take 1 capsule (4 mg total) by mouth at bedtime. 90 capsule 1  . vitamin C (ASCORBIC ACID) 500 MG tablet Take 1,000 mg by mouth daily.     Marland Kitchen VITAMIN D, CHOLECALCIFEROL, PO Take 50 mcg by mouth daily.    . zaleplon (SONATA) 10 MG capsule Take 1 capsule (10 mg total) at bedtime as needed by mouth for sleep. 90 capsule 0  . Zoster Vaccine Adjuvanted [facility]) injection     . hydrochlorothiazide (HYDRODIURIL) 25 MG tablet Take 1 tablet (25 mg total) by mouth daily. 90 tablet 3   No current facility-administered medications for this visit.      Past Medical History:  Diagnosis Date  . Atrial fibrillation (HCC)    a. paroxysmal, on Eliquis for anticoagulation  . BPH (benign prostatic hyperplasia)   . Bronchitis   . Cataract   . COPD (chronic obstructive pulmonary disease) (HCC)    2 liters O2 HS  . Depression   . Fatty tumor    waste and back  . Fibromyalgia   . GERD (gastroesophageal reflux disease)   . Glaucoma    bilateral eyes  . Gout   . Hereditary and idiopathic peripheral neuropathy 08/28/2015  . Hypertension   . Hypothyroidism   . Hypoxia   . Insomnia   . Memory disorder 08/28/2015  . On home oxygen therapy    at night with cpap  . Osteoarthritis   . Osteoporosis   . Peripheral neuropathy   . Rotator cuff tear, left   . Sleep apnea    uses cpap-add oxygen at night  . Spinal compression fracture (HCC) seventh vertebre  . Transient alteration of awareness 08/28/2015  . Type 2 diabetes mellitus ([place]) 03/08/2016  . Wears glasses     Past Surgical History:  Procedure Laterality Date  . APPENDECTOMY  age 28  . CARPAL TUNNEL RELEASE Right    early 2000s  . CATARACT EXTRACTION     bilateral  . CHOLECYSTECTOMY  age 57  . EYE SURGERY    . FOOT ARTHRODESIS Right 02/02/2013   Procedure: RIGHT HALLUX METATARSAL PHALANGEAL JOINT ARTHRODESIS ;  Surgeon: Wylene Simmer, MD;  Location: [facility];  Service:  Orthopedics;  Laterality: Right;  . INGUINAL HERNIA REPAIR  age 42   rt side  . NASAL CONCHA BULLOSA RESECTION  age 38  . PROSTATE SURGERY    . SHOULDER ARTHROSCOPY W/ ROTATOR CUFF REPAIR Right    early 2000s  . tonsil    . VASECTOMY  age 60    Social History   Socioeconomic History  . Marital status: Married    Spouse name: Not on file  . Number of  children: Not on file  . Years of education: Not on file  . Highest education level: Not on file  Social Needs  . Financial resource strain: Not on file  . Food insecurity - worry: Not on file  . Food insecurity - inability: Not on file  . Transportation needs - medical: Not on file  . Transportation needs - non-medical: Not on file  Occupational History  . Occupation: RETIRED    Employer: RETIRED  Tobacco Use  . Smoking status: Former Smoker    Packs/day: 2.00    Years: 10.00    Pack years: 20.00    Types: Cigarettes    Last attempt to quit: 07/18/1975    Years since quitting: 42.0  . Smokeless tobacco: Never Used  Substance and Sexual Activity  . Alcohol use: Yes    Comment: 1-2 drinks/day  . Drug use: No  . Sexual activity: Not on file  Other Topics Concern  . Not on file  Social History Narrative  . Not on file    Family History  Problem Relation Age of Onset  . Coronary artery disease Brother   . Heart disease Father   . Lung cancer Father   . Kidney cancer Father   . Prostate cancer Father   . Arthritis Mother   . Lung cancer Mother     ROS: no fevers or chills, productive cough, hemoptysis, dysphasia, odynophagia, melena, hematochezia, dysuria, hematuria, rash, seizure activity, orthopnea, PND, pedal edema, claudication. Remaining systems are negative.  Physical Exam: Well-developed well-nourished in no acute distress.  Skin is warm and dry.  HEENT is normal.  Neck is supple.  Chest is clear to auscultation with normal expansion.  Cardiovascular exam is regular rate and rhythm.  Abdominal exam  nontender or distended. No masses palpated. Extremities show no edema. neuro grossly intact   A/P  1 paroxysmal atrial fibrillation-patient remains in sinus rhythm today.  We will continue with Cardizem at present dose for rate control if atrial fibrillation recurs.  Continue apixaban.  We will consider an antiarrhythmic in the future if he has more frequent bouts of atrial fibrillation.  2 hypertension-Blood pressure is controlled. Continue present medications.  3 obstructive sleep apnea-continue CPAP.  4 COPD-per primary care.   Kirk Ruths, MD

## 2017-07-29 ENCOUNTER — Ambulatory Visit (INDEPENDENT_AMBULATORY_CARE_PROVIDER_SITE_OTHER): Payer: Medicare Other | Admitting: Family Medicine

## 2017-07-29 ENCOUNTER — Encounter: Payer: Self-pay | Admitting: Family Medicine

## 2017-07-29 VITALS — BP 138/76 | HR 76 | Temp 98.3°F | Ht 66.0 in | Wt 208.0 lb

## 2017-07-29 DIAGNOSIS — H1031 Unspecified acute conjunctivitis, right eye: Secondary | ICD-10-CM | POA: Insufficient documentation

## 2017-07-29 MED ORDER — ERYTHROMYCIN 5 MG/GM OP OINT: 1.0000 "application " | TOPICAL_OINTMENT | Freq: Three times a day (TID) | OPHTHALMIC | 0 refills | Status: AC

## 2017-07-29 NOTE — Progress Notes (Signed)
Jonathan [name] - 85 y.o. male MRN [id number]  Date of birth: February 16, 1933  SUBJECTIVE:  Including CC & ROS.  Chief Complaint  Patient presents with  . Conjunctivitis    Jonathan [name] is a 84 y.o. male that is presenting with right eye conjunctivitis. Symptoms started last night. He woke up with redness and swelling. He has been using Bacitracin eye drops with no improvement. Denies vision issues.    Review of Systems  Constitutional: Negative for fever.  HENT: Negative for sinus pain.   Eyes: Positive for redness. Negative for photophobia and visual disturbance.  Respiratory: Negative for cough.   Cardiovascular: Negative for chest pain.     HISTORY: Past Medical, Surgical, Social, and Family History Reviewed & Updated per EMR.   Pertinent Historical Findings include:  Past Medical History:  Diagnosis Date  . Atrial fibrillation (HCC)    a. paroxysmal, on Eliquis for anticoagulation  . BPH (benign prostatic hyperplasia)   . Bronchitis   . Cataract   . COPD (chronic obstructive pulmonary disease) (HCC)    2 liters O2 HS  . Depression   . Fatty tumor    waste and back  . Fibromyalgia   . GERD (gastroesophageal reflux disease)   . Glaucoma    bilateral eyes  . Gout   . Hereditary and idiopathic peripheral neuropathy 08/28/2015  . Hypertension   . Hypothyroidism   . Hypoxia   . Insomnia   . Memory disorder 08/28/2015  . On home oxygen therapy    at night with cpap  . Osteoarthritis   . Osteoporosis   . Peripheral neuropathy   . Rotator cuff tear, left   . Sleep apnea    uses cpap-add oxygen at night  . Spinal compression fracture (HCC) seventh vertebre  . Transient alteration of awareness 08/28/2015  . Type 2 diabetes mellitus ([place]) 03/08/2016  . Wears glasses     Past Surgical History:  Procedure Laterality Date  . APPENDECTOMY  age 41  . CARPAL TUNNEL RELEASE Right    early 2000s  . CATARACT EXTRACTION     bilateral  . CHOLECYSTECTOMY  age 40  . EYE SURGERY     . FOOT ARTHRODESIS Right 02/02/2013   Procedure: RIGHT HALLUX METATARSAL PHALANGEAL JOINT ARTHRODESIS ;  Surgeon: Wylene Simmer, MD;  Location: [facility];  Service: Orthopedics;  Laterality: Right;  . INGUINAL HERNIA REPAIR  age 64   rt side  . NASAL CONCHA BULLOSA RESECTION  age 40  . PROSTATE SURGERY    . SHOULDER ARTHROSCOPY W/ ROTATOR CUFF REPAIR Right    early 2000s  . tonsil    . VASECTOMY  age 68    No Known Allergies  Family History  Problem Relation Age of Onset  . Coronary artery disease Brother   . Heart disease Father   . Lung cancer Father   . Kidney cancer Father   . Prostate cancer Father   . Arthritis Mother   . Lung cancer Mother      Social History   Socioeconomic History  . Marital status: Married    Spouse name: Not on file  . Number of children: Not on file  . Years of education: Not on file  . Highest education level: Not on file  Social Needs  . Financial resource strain: Not on file  . Food insecurity - worry: Not on file  . Food insecurity - inability: Not on file  . Transportation needs - medical: Not on  file  . Transportation needs - non-medical: Not on file  Occupational History  . Occupation: RETIRED    Employer: RETIRED  Tobacco Use  . Smoking status: Former Smoker    Packs/day: 2.00    Years: 10.00    Pack years: 20.00    Types: Cigarettes    Last attempt to quit: 07/18/1975    Years since quitting: 42.0  . Smokeless tobacco: Never Used  Substance and Sexual Activity  . Alcohol use: Yes    Comment: 1-2 drinks/day  . Drug use: No  . Sexual activity: Not on file  Other Topics Concern  . Not on file  Social History Narrative  . Not on file     PHYSICAL EXAM:  VS: BP 138/76 (BP Location: Left Arm, Patient Position: Sitting, Cuff Size: Normal)   Pulse 76   Temp 98.3 F (36.8 C) (Oral)   Ht 5\' 6"  (1.676 m)   Wt 208 lb (94.3 kg)   SpO2 97%   BMI 33.57 kg/m  Physical Exam Gen: NAD, alert, cooperative with  exam, ENT: normal lips, normal nasal mucosa,  Eye: normal EOM, normal conjunctiva on left, injected conjunctiva on the right redness on the inferior lid and discharge. No debris noted recesses of the right eye. CV:  no edema, +2 pedal pulses   Resp: no accessory muscle use, non-labored,  Skin: no rashes, no areas of induration  Neuro: normal tone, normal sensation to touch Psych:  normal insight, alert and oriented MSK: normal gait, normal strength       ASSESSMENT & PLAN:   Acute bacterial conjunctivitis of right eye Changes are concerning for bacterial in nature. Has not had any exposure to anyone with similar symptoms. Having significant redness and pus discharge. - Erythromycin - Given indications to seek immediate care follow-up.

## 2017-07-29 NOTE — Patient Instructions (Signed)
I would apply ice to her finger. If this does not improve after 2-3 weeks and please follow-up with me. Please practicing good hand hygiene with the red eye. Please seek immediate care if you have changes in her vision or have a loss of vision.

## 2017-07-29 NOTE — Assessment & Plan Note (Signed)
Changes are concerning for bacterial in nature. Has not had any exposure to anyone with similar symptoms. Having significant redness and pus discharge. - Erythromycin - Given indications to seek immediate care follow-up.

## 2017-08-02 DIAGNOSIS — H52203 Unspecified astigmatism, bilateral: Secondary | ICD-10-CM | POA: Diagnosis not present

## 2017-08-02 DIAGNOSIS — H401131 Primary open-angle glaucoma, bilateral, mild stage: Secondary | ICD-10-CM | POA: Diagnosis not present

## 2017-08-02 DIAGNOSIS — H18603 Keratoconus, unspecified, bilateral: Secondary | ICD-10-CM | POA: Diagnosis not present

## 2017-08-02 DIAGNOSIS — H0012 Chalazion right lower eyelid: Secondary | ICD-10-CM | POA: Diagnosis not present

## 2017-08-04 DIAGNOSIS — H0015 Chalazion left lower eyelid: Secondary | ICD-10-CM | POA: Diagnosis not present

## 2017-08-04 DIAGNOSIS — H04123 Dry eye syndrome of bilateral lacrimal glands: Secondary | ICD-10-CM | POA: Diagnosis not present

## 2017-08-04 DIAGNOSIS — H0012 Chalazion right lower eyelid: Secondary | ICD-10-CM | POA: Diagnosis not present

## 2017-08-09 ENCOUNTER — Other Ambulatory Visit (INDEPENDENT_AMBULATORY_CARE_PROVIDER_SITE_OTHER): Payer: Medicare Other

## 2017-08-09 ENCOUNTER — Encounter: Payer: Self-pay | Admitting: Internal Medicine

## 2017-08-09 ENCOUNTER — Ambulatory Visit (INDEPENDENT_AMBULATORY_CARE_PROVIDER_SITE_OTHER): Payer: Medicare Other | Admitting: Internal Medicine

## 2017-08-09 VITALS — BP 134/70 | HR 77 | Temp 98.4°F | Resp 16 | Wt 207.0 lb

## 2017-08-09 DIAGNOSIS — R110 Nausea: Secondary | ICD-10-CM | POA: Diagnosis not present

## 2017-08-09 DIAGNOSIS — M549 Dorsalgia, unspecified: Secondary | ICD-10-CM | POA: Diagnosis not present

## 2017-08-09 DIAGNOSIS — F410 Panic disorder [episodic paroxysmal anxiety] without agoraphobia: Secondary | ICD-10-CM

## 2017-08-09 DIAGNOSIS — E119 Type 2 diabetes mellitus without complications: Secondary | ICD-10-CM

## 2017-08-09 DIAGNOSIS — E038 Other specified hypothyroidism: Secondary | ICD-10-CM

## 2017-08-09 DIAGNOSIS — G4700 Insomnia, unspecified: Secondary | ICD-10-CM

## 2017-08-09 LAB — COMPREHENSIVE METABOLIC PANEL
ALT: 23 U/L (ref 0–53)
AST: 21 U/L (ref 0–37)
Albumin: 4.3 g/dL (ref 3.5–5.2)
Alkaline Phosphatase: 84 U/L (ref 39–117)
BUN: 18 mg/dL (ref 6–23)
CO2: 31 mEq/L (ref 19–32)
Calcium: 10 mg/dL (ref 8.4–10.5)
Chloride: 98 mEq/L (ref 96–112)
Creatinine, Ser: 1.03 mg/dL (ref 0.40–1.50)
GFR: 73.45 mL/min (ref >60.00)
Glucose, Bld: 129 mg/dL — ABNORMAL HIGH (ref 70–99)
Potassium: 3.5 mEq/L (ref 3.5–5.1)
Sodium: 140 mEq/L (ref 135–145)
Total Bilirubin: 0.5 mg/dL (ref 0.2–1.2)
Total Protein: 6.8 g/dL (ref 6.0–8.3)

## 2017-08-09 LAB — TSH: TSH: 2.25 u[IU]/mL (ref 0.35–4.50)

## 2017-08-09 LAB — HEMOGLOBIN A1C: Hgb A1c MFr Bld: 7.9 % — ABNORMAL HIGH (ref 4.6–6.5)

## 2017-08-09 NOTE — Patient Instructions (Addendum)
  Test(s) ordered today. Your results will be released to [place] (or called to you) after review, usually within 72hours after test completion. If any changes need to be made, you will be notified at that same time.   Medications reviewed and updated.  No changes recommended at this time.  Call if no improvement

## 2017-08-09 NOTE — Progress Notes (Signed)
Subjective:    Patient ID: Jonathan [name], male    DOB: June 20, 1932, 84 y.o.   MRN: [id number]  HPI The patient is here for an acute visit.  Anxiety:  He had a significant panic attack yesterday - BP 158/97, he was pacing and could not sit down.  He tried walking, meditation, deep breathing.  He has not had a panic attack in years - he only had one prior panic attack.  He is unsure what the trigger was.   He is taking Cymbalta on a daily basis.  Overall he feels his depression is fairly controlled.  He does have some anxiety regarding some things in his life, but denies any generalized anxiety.  The night before he had severe pain in his feet.  He took 7.5 mg oxycodone (cut the 15 mg in half).  The next day his wife had old valium and he took 2.5 mg - he since found out it is good to take with oxycodone.  He was not sure what to do for the panic attack that is why he took the Valium.  He is also experiencing other symptoms and is unsure if they are related to anything or nothing.  He has had low-grade nausea for the past 2 weeks, mid back pain in the left, his hair has been thinning for the past month.  He has had 2 styes in both eyes in the past 2 weeks knees still putting in drops.  He is itchy skin all over for the past couple of months.  He has stopped taking Lyrica due to the cost.  He is unsure if this has affected his overall pain level.  He does not take oxycodone/oxycontin daily, typically takes 1-2 times a month only.  He has different doses and only takes as needed.    Difficulty sleeping - has been taking 2-3 shots of buorbon.    Medications and allergies reviewed with patient and updated if appropriate.  Patient Active Problem List   Diagnosis Date Noted  . Panic attack 08/09/2017  . COPD with acute exacerbation ([place]) 07/23/2017  . Wears glasses   . Spinal compression fracture ([place])   . Sleep apnea   . Rotator cuff tear, left   . Peripheral neuropathy   . Osteoarthritis     . On home oxygen therapy   . Hypoxia   . Fibromyalgia   . Fatty tumor   . Cataract   . BPH (benign prostatic hyperplasia)   . Atrial fibrillation ([place])   . Insomnia 03/10/2017  . Chronic pain, legs and back 03/10/2017  . Hyperlipidemia 03/10/2017  . Unilateral occipital headache 12/22/2016  . Obesity (BMI 30.0-34.9) 12/08/2016  . Depression 09/07/2016  . Melena 03/26/2016  . Hypothyroidism 03/08/2016  . GERD (gastroesophageal reflux disease) 03/08/2016  . Gout 03/08/2016  . Glaucoma 03/08/2016  . Type 2 diabetes mellitus ([place]) 03/08/2016  . Hereditary and idiopathic peripheral neuropathy 08/28/2015  . Memory disorder 08/28/2015  . Transient alteration of awareness 08/28/2015  . Fatigue 01/08/2015  . Bruit 08/15/2013  . Hypertension 12/04/2011  . Allergic rhinitis, seasonal 12/04/2011  . Sleep apnea, obstructive 08/25/2011  . Atrial fibrillation with RVR ([place]) 07/20/2011  . CKD (chronic kidney disease) 07/20/2011  . COPD (chronic obstructive pulmonary disease) ([place]) 07/18/2011    Current Outpatient Medications on File Prior to Visit  Medication Sig Dispense Refill  . acetaminophen (TYLENOL) 500 MG tablet Take 2 tablets (1,000 mg total) by mouth every 8 (eight) hours as  needed. 360 tablet 1  . alendronate (FOSAMAX) 70 MG tablet Take 1 tablet (70 mg total) by mouth every Monday. 12 tablet 1  . allopurinol (ZYLOPRIM) 300 MG tablet Take 1 tablet (300 mg total) by mouth every evening. 90 tablet 1  . atorvastatin (LIPITOR) 20 MG tablet Take 1 tablet (20 mg total) by mouth daily. 90 tablet 3  . Biotin 5000 MCG TABS Take 5,000 mcg by mouth daily.    . brimonidine (ALPHAGAN) 0.15 % ophthalmic solution Place 1 drop into the left eye 2 (two) times daily.     . Calcium Lactate 750 MG TABS Take 1 tablet by mouth daily.     . Cholecalciferol (VITAMIN D3 PO) Take 1,000 mg by mouth daily.    . colchicine (COLCRYS) 0.6 MG tablet Take 0.6 mg by mouth daily as needed (flareups).     .  diltiazem (CARDIZEM CD) 300 MG 24 hr capsule Take 1 capsule (300 mg total) daily by mouth. 90 capsule 3  . doxazosin (CARDURA) 8 MG tablet TAKE 1 TABLET BY MOUTH  DAILY 90 tablet 1  . DULoxetine HCl (CYMBALTA PO) Take 90 mg daily by mouth.    Arne Cleveland 5 MG TABS tablet TAKE 1 TABLET BY MOUTH TWO  TIMES DAILY 180 tablet 1  . latanoprost (XALATAN) 0.005 % ophthalmic solution Place 1 drop into both eyes at bedtime. 7.5 mL 3  . levalbuterol (XOPENEX HFA) 45 MCG/ACT inhaler Inhale 1-2 puffs into the lungs every 4 (four) hours as needed for wheezing. 1 Inhaler 12  . levothyroxine (SYNTHROID, LEVOTHROID) 100 MCG tablet Take 1 tablet (100 mcg total) by mouth daily. 90 tablet 1  . magnesium oxide (MAG-OX) 400 MG tablet Take 400 mg by mouth daily.    . mirabegron ER (MYRBETRIQ) 25 MG TB24 tablet Take 1 tablet (25 mg total) daily by mouth. (Patient taking differently: Take 50 mg by mouth daily. ) 90 tablet 1  . Multiple Vitamin (MULTIVITAMIN) tablet Take 1 tablet by mouth daily.    . mupirocin cream (BACTROBAN) 2 % Apply 1 application topically 2 (two) times daily. 15 g 0  . nitroGLYCERIN (NITROSTAT) 0.4 MG SL tablet Place 1 tablet (0.4 mg total) under the tongue every 5 (five) minutes as needed for chest pain. 90 tablet 3  . omeprazole (PRILOSEC) 20 MG capsule Take 20 mg by mouth daily.    Marland Kitchen oxyCODONE (OXY IR/ROXICODONE) 5 MG immediate release tablet Take 5 mg by mouth every 4 (four) hours as needed for severe pain.    Marland Kitchen oxyCODONE (OXYCONTIN) 10 mg 12 hr tablet Take 10 mg by mouth every 12 (twelve) hours as needed.    Marland Kitchen oxyCODONE (ROXICODONE) 15 MG immediate release tablet Take 15 mg by mouth every 4 (four) hours as needed for pain.    . potassium gluconate (EQL POTASSIUM GLUCONATE) 595 (99 K) MG TABS tablet Take 595 mg by mouth daily.    . tadalafil (CIALIS) 20 MG tablet Take 1 tablet (20 mg total) by mouth daily as needed. For erectile dysfunction 10 tablet 1  . Tiotropium Bromide-Olodaterol (STIOLTO  RESPIMAT) 2.5-2.5 MCG/ACT AERS Inhale 2 puffs into the lungs daily. 3 Inhaler 3  . tiZANidine (ZANAFLEX) 4 MG capsule Take 1 capsule (4 mg total) by mouth at bedtime. 90 capsule 1  . vitamin C (ASCORBIC ACID) 500 MG tablet Take 1,000 mg by mouth daily.     . zaleplon (SONATA) 10 MG capsule Take 1 capsule (10 mg total) at bedtime as needed  by mouth for sleep. 90 capsule 0  . Zoster Vaccine Adjuvanted [facility]) injection     . hydrochlorothiazide (HYDRODIURIL) 25 MG tablet Take 1 tablet (25 mg total) by mouth daily. 90 tablet 3  . [DISCONTINUED] calcium-vitamin D (OSCAL WITH D) 500-200 MG-UNIT per tablet Take 1 tablet by mouth daily.    . [DISCONTINUED] diphenhydrAMINE (SOMINEX) 25 MG tablet Take 50 mg by mouth at bedtime as needed. For sleep     No current facility-administered medications on file prior to visit.     Past Medical History:  Diagnosis Date  . Atrial fibrillation ([facility])    a. paroxysmal, on Eliquis for anticoagulation  . BPH (benign prostatic hyperplasia)   . Bronchitis   . Cataract   . COPD (chronic obstructive pulmonary disease) ([facility])    2 liters O2 HS  . Depression   . Fatty tumor    waste and back  . Fibromyalgia   . GERD (gastroesophageal reflux disease)   . Glaucoma    bilateral eyes  . Gout   . Hereditary and idiopathic peripheral neuropathy 08/28/2015  . Hypertension   . Hypothyroidism   . Hypoxia   . Insomnia   . Memory disorder 08/28/2015  . On home oxygen therapy    at night with cpap  . Osteoarthritis   . Osteoporosis   . Peripheral neuropathy   . Rotator cuff tear, left   . Sleep apnea    uses cpap-add oxygen at night  . Spinal compression fracture ([facility]) seventh vertebre  . Transient alteration of awareness 08/28/2015  . Type 2 diabetes mellitus ([place]) 03/08/2016  . Wears glasses     Past Surgical History:  Procedure Laterality Date  . APPENDECTOMY  age 49  . CARPAL TUNNEL RELEASE Right    early 2000s  . CATARACT EXTRACTION     bilateral  .  CHOLECYSTECTOMY  age 21  . EYE SURGERY    . FOOT ARTHRODESIS Right 02/02/2013   Procedure: RIGHT HALLUX METATARSAL PHALANGEAL JOINT ARTHRODESIS ;  Surgeon: Wylene Simmer, MD;  Location: [facility];  Service: Orthopedics;  Laterality: Right;  . INGUINAL HERNIA REPAIR  age 54   rt side  . NASAL CONCHA BULLOSA RESECTION  age 59  . PROSTATE SURGERY    . SHOULDER ARTHROSCOPY W/ ROTATOR CUFF REPAIR Right    early 2000s  . tonsil    . VASECTOMY  age 30    Social History   Socioeconomic History  . Marital status: Married    Spouse name: None  . Number of children: None  . Years of education: None  . Highest education level: None  Social Needs  . Financial resource strain: None  . Food insecurity - worry: None  . Food insecurity - inability: None  . Transportation needs - medical: None  . Transportation needs - non-medical: None  Occupational History  . Occupation: RETIRED    Employer: RETIRED  Tobacco Use  . Smoking status: Former Smoker    Packs/day: 2.00    Years: 10.00    Pack years: 20.00    Types: Cigarettes    Last attempt to quit: 07/18/1975    Years since quitting: 42.0  . Smokeless tobacco: Never Used  Substance and Sexual Activity  . Alcohol use: Yes    Comment: 1-2 drinks/day  . Drug use: No  . Sexual activity: None  Other Topics Concern  . None  Social History Narrative  . None    Family History  Problem  Relation Age of Onset  . Coronary artery disease Brother   . Heart disease Father   . Lung cancer Father   . Kidney cancer Father   . Prostate cancer Father   . Arthritis Mother   . Lung cancer Mother     Review of Systems  Gastrointestinal: Positive for nausea (mild x 2-3 weeks).       No gerd  Genitourinary: Negative for dysuria, frequency and hematuria.  Musculoskeletal: Positive for back pain.  Skin:       Thinning of hair, itchy skin  Psychiatric/Behavioral: Positive for dysphoric mood (overall controlled). The patient is  nervous/anxious (some anxiety).        Objective:   Vitals:   08/09/17 1440  BP: 134/70  Pulse: 77  Resp: 16  Temp: 98.4 F (36.9 C)  SpO2: 96%   Wt Readings from Last 3 Encounters:  08/09/17 207 lb (93.9 kg)  07/29/17 208 lb (94.3 kg)  07/23/17 208 lb 12.8 oz (94.7 kg)   Body mass index is 33.41 kg/m.   Physical Exam  Constitutional: He appears well-developed and well-nourished. No distress.  HENT:  Head: Normocephalic and atraumatic.  Eyes: Conjunctivae are normal.  Neck: Neck supple. No tracheal deviation present. No thyromegaly present.  Cardiovascular: Normal rate, regular rhythm and normal heart sounds.  Pulmonary/Chest: Effort normal and breath sounds normal. No respiratory distress. He has no wheezes. He has no rales.  Musculoskeletal: He exhibits edema (Mild bilateral lower extremity edema).  Lymphadenopathy:    He has no cervical adenopathy.  Skin: Skin is warm and dry. He is not diaphoretic.  Psychiatric: He has a normal mood and affect. His behavior is normal. Judgment and thought content normal.           Assessment & Plan:    See Problem List for Assessment and Plan of chronic medical problems.

## 2017-08-10 ENCOUNTER — Encounter: Payer: Self-pay | Admitting: Internal Medicine

## 2017-08-10 ENCOUNTER — Ambulatory Visit (INDEPENDENT_AMBULATORY_CARE_PROVIDER_SITE_OTHER): Payer: Medicare Other | Admitting: Cardiology

## 2017-08-10 ENCOUNTER — Encounter: Payer: Self-pay | Admitting: Cardiology

## 2017-08-10 VITALS — BP 130/72 | HR 89 | Ht 66.5 in | Wt 207.0 lb

## 2017-08-10 DIAGNOSIS — M546 Pain in thoracic spine: Secondary | ICD-10-CM

## 2017-08-10 DIAGNOSIS — R110 Nausea: Secondary | ICD-10-CM | POA: Insufficient documentation

## 2017-08-10 DIAGNOSIS — I480 Paroxysmal atrial fibrillation: Secondary | ICD-10-CM

## 2017-08-10 DIAGNOSIS — M549 Dorsalgia, unspecified: Secondary | ICD-10-CM | POA: Insufficient documentation

## 2017-08-10 DIAGNOSIS — J449 Chronic obstructive pulmonary disease, unspecified: Secondary | ICD-10-CM | POA: Diagnosis not present

## 2017-08-10 DIAGNOSIS — I10 Essential (primary) hypertension: Secondary | ICD-10-CM

## 2017-08-10 HISTORY — DX: Pain in thoracic spine: M54.6

## 2017-08-10 LAB — CBC WITH DIFFERENTIAL/PLATELET
Basophils Absolute: 0 10*3/uL (ref 0.0–0.1)
Basophils Relative: 0.2 % (ref 0.0–3.0)
Eosinophils Absolute: 0 10*3/uL (ref 0.0–0.7)
Eosinophils Relative: 0.3 % (ref 0.0–5.0)
HCT: 41.9 % (ref 39.0–52.0)
Hemoglobin: 14.2 g/dL (ref 13.0–17.0)
Lymphocytes Relative: 32 % (ref 12.0–46.0)
Lymphs Abs: 1.8 10*3/uL (ref 0.7–4.0)
MCHC: 33.9 g/dL (ref 30.0–36.0)
MCV: 90.3 fl (ref 78.0–100.0)
Monocytes Absolute: 1.1 10*3/uL — ABNORMAL HIGH (ref 0.1–1.0)
Monocytes Relative: 20.9 % — ABNORMAL HIGH (ref 3.0–12.0)
Neutro Abs: 2.5 10*3/uL (ref 1.4–7.7)
Neutrophils Relative %: 46.6 % (ref 43.0–77.0)
Platelets: 139 10*3/uL — ABNORMAL LOW (ref 150.0–400.0)
RBC: 4.63 Mil/uL (ref 4.22–5.81)
RDW: 14.8 % (ref 11.5–15.5)
WBC: 5.5 10*3/uL (ref 4.0–10.5)

## 2017-08-10 NOTE — Assessment & Plan Note (Signed)
He has a long history of difficulty sleeping Recently has started using alcohol to help him sleep, which I strongly advised against Encouraged him to try other methods to help sleep

## 2017-08-10 NOTE — Assessment & Plan Note (Signed)
Diet controlled Will check A1c since he is getting blood work today I doubt his sugars are because his current symptoms, but need to evaluate and sugar control

## 2017-08-10 NOTE — Assessment & Plan Note (Signed)
?    Musculoskeletal in nature or kidney related We will check urinalysis, urine culture, CMP If above negative and pain persists may need imaging

## 2017-08-10 NOTE — Assessment & Plan Note (Signed)
He had a panic attack yesterday and has only had one other panic attack Cause unknown He did end up taking one of his wife's old Valium-took a small dose.  Ideally I would like to avoid benzodiazepines given he is on pain medication, but he does not take the pain medication on a frequent basis-may take only 1-2 times a month, so we can consider benzodiazepine if needed Advised that he could try lavender supplementation He would like to hold off on additional medication, but if he does have an another panic attack we will consider medication

## 2017-08-10 NOTE — Assessment & Plan Note (Signed)
Mild nausea for at least 2 weeks, unknown cause ?  Related to mid back pain He denies GERD and changes in medication We will check basic blood work and rule out a UTI

## 2017-08-10 NOTE — Assessment & Plan Note (Signed)
Will check TSH This could be causing some anxiety We will adjust medication dose as needed

## 2017-08-10 NOTE — Patient Instructions (Signed)
Your physician wants you to follow-up in: 6 MONTHS WITH DR CRENSHAW You will receive a reminder letter in the mail two months in advance. If you don't receive a letter, please call our office to schedule the follow-up appointment.   If you need a refill on your cardiac medications before your next appointment, please call your pharmacy.  

## 2017-08-11 ENCOUNTER — Encounter: Payer: Self-pay | Admitting: Internal Medicine

## 2017-08-11 ENCOUNTER — Other Ambulatory Visit (INDEPENDENT_AMBULATORY_CARE_PROVIDER_SITE_OTHER): Payer: Medicare Other

## 2017-08-11 DIAGNOSIS — E119 Type 2 diabetes mellitus without complications: Secondary | ICD-10-CM

## 2017-08-11 DIAGNOSIS — M549 Dorsalgia, unspecified: Secondary | ICD-10-CM | POA: Diagnosis not present

## 2017-08-11 DIAGNOSIS — R110 Nausea: Secondary | ICD-10-CM

## 2017-08-11 LAB — URINALYSIS, ROUTINE W REFLEX MICROSCOPIC
Bilirubin Urine: NEGATIVE
Hgb urine dipstick: NEGATIVE
Ketones, ur: NEGATIVE
Leukocytes, UA: NEGATIVE
Nitrite: NEGATIVE
RBC / HPF: NONE SEEN (ref 0–?)
Specific Gravity, Urine: 1.015 (ref 1.000–1.030)
Total Protein, Urine: 30 — AB
Urine Glucose: NEGATIVE
Urobilinogen, UA: 0.2 (ref 0.0–1.0)
pH: 7.5 (ref 5.0–8.0)

## 2017-08-11 LAB — MICROALBUMIN / CREATININE URINE RATIO
Creatinine,U: 174.6 mg/dL
Microalb Creat Ratio: 16.1 mg/g (ref 0.0–30.0)
Microalb, Ur: 28.1 mg/dL — ABNORMAL HIGH (ref 0.0–1.9)

## 2017-08-12 LAB — URINE CULTURE
MICRO NUMBER:: 90256344
Result:: NO GROWTH
SPECIMEN QUALITY:: ADEQUATE

## 2017-08-13 ENCOUNTER — Encounter: Payer: Self-pay | Admitting: Internal Medicine

## 2017-08-13 MED ORDER — METFORMIN HCL 500 MG PO TABS: 500.0000 mg | ORAL_TABLET | Freq: Two times a day (BID) | ORAL | 3 refills | Status: DC

## 2017-08-17 ENCOUNTER — Encounter: Payer: Self-pay | Admitting: Internal Medicine

## 2017-09-09 ENCOUNTER — Telehealth: Payer: Self-pay | Admitting: *Deleted

## 2017-09-09 NOTE — Telephone Encounter (Signed)
PA for Stiolto Respimat started.  [place], [phone], spoke with [facility], she stated that form faxed, needed to be filled out and faxed back to Stephen at [phone], fax confirmation received.   Will route to [place] to follow up on

## 2017-09-10 ENCOUNTER — Telehealth: Payer: Self-pay | Admitting: *Deleted

## 2017-09-10 NOTE — Telephone Encounter (Signed)
tier exception paperwork for xarelto faxed to [employer] @ [phone].

## 2017-09-13 ENCOUNTER — Other Ambulatory Visit: Payer: Self-pay | Admitting: Emergency Medicine

## 2017-09-13 MED ORDER — OMEPRAZOLE 20 MG PO CPDR: 20.0000 mg | DELAYED_RELEASE_CAPSULE | Freq: Every day | ORAL | 1 refills | Status: DC

## 2017-09-13 NOTE — Telephone Encounter (Signed)
Insurance sent a denial letter for [employer] 2.5. It was denied for a tiering exception not applicable for this patient under this plan. They stated we would have to prescibe a lower tier medication first before coverage for Stiolto can be given.   PA reference [id number] Member number [id number]  I called pt to get a formulary or have him request one because I am not sure which inhalers are covered. I called [employer] but there was a busy signal. Unable to leave message.

## 2017-09-20 ENCOUNTER — Telehealth: Payer: Self-pay | Admitting: Cardiology

## 2017-09-20 NOTE — Telephone Encounter (Signed)
Medication samples have been provided to the patient.  Drug name: eliquis 5mg   Qty: 2 boxes  LOT: [id number]  Exp.Date: 11/2019  Samples left at front desk for patient pick-up. Patient notified.  Sheral Apley M 4:23 PM 09/20/2017

## 2017-09-20 NOTE — Telephone Encounter (Signed)
New message     Patient needs to you contact his insurance company about tier reduction .   Please call Medicare to speak to them about tier reduction   Patient calling the office for samples of medication:   1.  What medication and dosage are you requesting samples for?  Eliquis 5 mg  (uses [facility] for prescriptions)  2.  Are you currently out of this medication? Yes

## 2017-09-20 NOTE — Telephone Encounter (Signed)
Patient aware will route to [facility] for tier exception. He states she was able to get this done for his last year.

## 2017-09-21 ENCOUNTER — Encounter: Payer: Self-pay | Admitting: *Deleted

## 2017-09-21 DIAGNOSIS — M5134 Other intervertebral disc degeneration, thoracic region: Secondary | ICD-10-CM | POA: Insufficient documentation

## 2017-09-21 DIAGNOSIS — S22000A Wedge compression fracture of unspecified thoracic vertebra, initial encounter for closed fracture: Secondary | ICD-10-CM | POA: Insufficient documentation

## 2017-09-21 DIAGNOSIS — M546 Pain in thoracic spine: Secondary | ICD-10-CM | POA: Diagnosis not present

## 2017-09-21 DIAGNOSIS — M4854XS Collapsed vertebra, not elsewhere classified, thoracic region, sequela of fracture: Secondary | ICD-10-CM | POA: Diagnosis not present

## 2017-09-21 DIAGNOSIS — M545 Low back pain: Secondary | ICD-10-CM | POA: Diagnosis not present

## 2017-09-21 DIAGNOSIS — M810 Age-related osteoporosis without current pathological fracture: Secondary | ICD-10-CM | POA: Diagnosis not present

## 2017-09-21 DIAGNOSIS — M5136 Other intervertebral disc degeneration, lumbar region: Secondary | ICD-10-CM | POA: Diagnosis not present

## 2017-09-21 DIAGNOSIS — M4310 Spondylolisthesis, site unspecified: Secondary | ICD-10-CM | POA: Diagnosis not present

## 2017-09-21 HISTORY — DX: Other intervertebral disc degeneration, thoracic region: M51.34

## 2017-09-21 HISTORY — DX: Wedge compression fracture of unspecified thoracic vertebra, initial encounter for closed fracture: S22.000A

## 2017-09-21 NOTE — Telephone Encounter (Signed)
Spoke with pt, appeal letter mailed to [employer].

## 2017-09-27 ENCOUNTER — Other Ambulatory Visit: Payer: Self-pay | Admitting: Internal Medicine

## 2017-10-10 NOTE — Progress Notes (Signed)
Subjective:    Patient ID: Jonathan [name], male    DOB: 03-07-1933, 84 y.o.   MRN: [id number]  HPI The patient is here for follow up.  Diabetes: He is taking his medication daily, but often forgets his second dose. He is compliant with a diabetic diet. He is not exercising regularly.  He wonders if he should be checking his sugars on a regular basis.  Afib, Hypertension: He is taking his medication daily. He is compliant with a low sodium diet.  He did have an episode of Afib yesterday - had fatigue.  He typically does not get palpitations.  It lasted 1-2 hours.  BP dropped during the event.  Resolved spontaneously-he was advised he can take an extra dose of his medication, but did not feel he needed to do that.  He denies chest pain, shortness of breath and regular headaches. He is not exercising regularly.  He does monitor his blood pressure at home.    Hypothyroidism:  He is taking his medication daily.  He does have some chronic fatigue.  He denies any recent changes in energy or weight that are unexplained.   Hyperlipidemia: He is taking his medication daily. He is compliant with a low fat/cholesterol diet. He is not exercising regularly. He denies myalgias.   OP:  Been on fosamax no more than 2 years.  Not taking it consistently.  Skips one week a month.  He has not had a recent bone density-this was ordered at his last visit, but not scheduled.  Medications and allergies reviewed with patient and updated if appropriate.  Patient Active Problem List   Diagnosis Date Noted  . Nausea 08/10/2017  . Mid back pain on left side 08/10/2017  . Panic attack 08/09/2017  . COPD with acute exacerbation ([place]) 07/23/2017  . Wears glasses   . Spinal compression fracture ([place])   . Sleep apnea   . Rotator cuff tear, left   . Peripheral neuropathy   . Osteoarthritis   . On home oxygen therapy   . Hypoxia   . Fibromyalgia   . Fatty tumor   . Cataract   . BPH (benign prostatic hyperplasia)    . Atrial fibrillation ([place])   . Insomnia 03/10/2017  . Chronic pain, legs and back 03/10/2017  . Hyperlipidemia 03/10/2017  . Unilateral occipital headache 12/22/2016  . Obesity (BMI 30.0-34.9) 12/08/2016  . Depression 09/07/2016  . Melena 03/26/2016  . Hypothyroidism 03/08/2016  . GERD (gastroesophageal reflux disease) 03/08/2016  . Gout 03/08/2016  . Glaucoma 03/08/2016  . Type 2 diabetes mellitus ([place]) 03/08/2016  . Hereditary and idiopathic peripheral neuropathy 08/28/2015  . Memory disorder 08/28/2015  . Transient alteration of awareness 08/28/2015  . Fatigue 01/08/2015  . Bruit 08/15/2013  . Hypertension 12/04/2011  . Allergic rhinitis, seasonal 12/04/2011  . Sleep apnea, obstructive 08/25/2011  . Atrial fibrillation with RVR ([place]) 07/20/2011  . CKD (chronic kidney disease) 07/20/2011  . COPD (chronic obstructive pulmonary disease) ([place]) 07/18/2011    Current Outpatient Medications on File Prior to Visit  Medication Sig Dispense Refill  . acetaminophen (TYLENOL) 500 MG tablet Take 2 tablets (1,000 mg total) by mouth every 8 (eight) hours as needed. 360 tablet 1  . alendronate (FOSAMAX) 70 MG tablet Take 1 tablet (70 mg total) by mouth every Monday. 12 tablet 1  . allopurinol (ZYLOPRIM) 300 MG tablet Take 1 tablet (300 mg total) by mouth every evening. 90 tablet 1  . atorvastatin (LIPITOR) 20 MG tablet Take  1 tablet (20 mg total) by mouth daily. 90 tablet 3  . Biotin 5000 MCG TABS Take 5,000 mcg by mouth daily.    . brimonidine (ALPHAGAN) 0.15 % ophthalmic solution Place 1 drop into the left eye 2 (two) times daily.     . Calcium Lactate 750 MG TABS Take 1 tablet by mouth daily.     . colchicine (COLCRYS) 0.6 MG tablet Take 0.6 mg by mouth daily as needed (flareups).     . diltiazem (CARDIZEM CD) 300 MG 24 hr capsule Take 1 capsule (300 mg total) daily by mouth. 90 capsule 3  . doxazosin (CARDURA) 8 MG tablet TAKE 1 TABLET BY MOUTH  DAILY 90 tablet 1  . DULoxetine  (CYMBALTA) 30 MG capsule TAKE 2 CAPSULES BY MOUTH IN THE MORNING AND 1 CAPSULE  BY MOUTH IN THE EVENING 270 capsule 1  . DULoxetine HCl (CYMBALTA PO) Take 90 mg daily by mouth.    Arne Cleveland 5 MG TABS tablet TAKE 1 TABLET BY MOUTH TWO  TIMES DAILY 180 tablet 1  . hydrochlorothiazide (HYDRODIURIL) 25 MG tablet TAKE 1 TABLET BY MOUTH  DAILY 90 tablet 3  . latanoprost (XALATAN) 0.005 % ophthalmic solution Place 1 drop into both eyes at bedtime. 7.5 mL 3  . levalbuterol (XOPENEX HFA) 45 MCG/ACT inhaler Inhale 1-2 puffs into the lungs every 4 (four) hours as needed for wheezing. 1 Inhaler 12  . levothyroxine (SYNTHROID, LEVOTHROID) 100 MCG tablet Take 1 tablet (100 mcg total) by mouth daily. 90 tablet 1  . magnesium oxide (MAG-OX) 400 MG tablet Take 400 mg by mouth daily.    . metFORMIN (GLUCOPHAGE) 500 MG tablet Take 1 tablet (500 mg total) by mouth 2 (two) times daily with a meal. 180 tablet 3  . mirabegron ER (MYRBETRIQ) 25 MG TB24 tablet Take 1 tablet (25 mg total) daily by mouth. (Patient taking differently: Take 50 mg by mouth daily. ) 90 tablet 1  . Multiple Vitamin (MULTIVITAMIN) tablet Take 1 tablet by mouth daily.    . mupirocin cream (BACTROBAN) 2 % Apply 1 application topically 2 (two) times daily. 15 g 0  . nitroGLYCERIN (NITROSTAT) 0.4 MG SL tablet Place 1 tablet (0.4 mg total) under the tongue every 5 (five) minutes as needed for chest pain. 90 tablet 3  . omeprazole (PRILOSEC) 20 MG capsule Take 1 capsule (20 mg total) by mouth daily. 90 capsule 1  . oxyCODONE (OXY IR/ROXICODONE) 5 MG immediate release tablet Take 5 mg by mouth every 4 (four) hours as needed for severe pain.    Marland Kitchen oxyCODONE (OXYCONTIN) 10 mg 12 hr tablet Take 10 mg by mouth every 12 (twelve) hours as needed.    Marland Kitchen oxyCODONE (ROXICODONE) 15 MG immediate release tablet Take 15 mg by mouth every 4 (four) hours as needed for pain.    . potassium gluconate (EQL POTASSIUM GLUCONATE) 595 (99 K) MG TABS tablet Take 595 mg by mouth  daily.    . tadalafil (CIALIS) 20 MG tablet Take 1 tablet (20 mg total) by mouth daily as needed. For erectile dysfunction 10 tablet 1  . Tiotropium Bromide-Olodaterol (STIOLTO RESPIMAT) 2.5-2.5 MCG/ACT AERS Inhale 2 puffs into the lungs daily. 3 Inhaler 3  . tiZANidine (ZANAFLEX) 4 MG capsule Take 1 capsule (4 mg total) by mouth at bedtime. 90 capsule 1  . vitamin C (ASCORBIC ACID) 500 MG tablet Take 1,000 mg by mouth daily.     Marland Kitchen VITAMIN D, CHOLECALCIFEROL, PO Take 50 mcg by  mouth daily.    . zaleplon (SONATA) 10 MG capsule Take 1 capsule (10 mg total) at bedtime as needed by mouth for sleep. 90 capsule 0  . Zoster Vaccine Adjuvanted [facility]) injection     . [DISCONTINUED] calcium-vitamin D (OSCAL WITH D) 500-200 MG-UNIT per tablet Take 1 tablet by mouth daily.    . [DISCONTINUED] diphenhydrAMINE (SOMINEX) 25 MG tablet Take 50 mg by mouth at bedtime as needed. For sleep     No current facility-administered medications on file prior to visit.     Past Medical History:  Diagnosis Date  . Atrial fibrillation (HCC)    a. paroxysmal, on Eliquis for anticoagulation  . BPH (benign prostatic hyperplasia)   . Bronchitis   . Cataract   . COPD (chronic obstructive pulmonary disease) (HCC)    2 liters O2 HS  . Depression   . Fatty tumor    waste and back  . Fibromyalgia   . GERD (gastroesophageal reflux disease)   . Glaucoma    bilateral eyes  . Gout   . Hereditary and idiopathic peripheral neuropathy 08/28/2015  . Hypertension   . Hypothyroidism   . Hypoxia   . Insomnia   . Memory disorder 08/28/2015  . On home oxygen therapy    at night with cpap  . Osteoarthritis   . Osteoporosis   . Peripheral neuropathy   . Rotator cuff tear, left   . Sleep apnea    uses cpap-add oxygen at night  . Spinal compression fracture (HCC) seventh vertebre  . Transient alteration of awareness 08/28/2015  . Type 2 diabetes mellitus ([place]) 03/08/2016  . Wears glasses     Past Surgical History:    Procedure Laterality Date  . APPENDECTOMY  age 63  . CARPAL TUNNEL RELEASE Right    early 2000s  . CATARACT EXTRACTION     bilateral  . CHOLECYSTECTOMY  age 35  . EYE SURGERY    . FOOT ARTHRODESIS Right 02/02/2013   Procedure: RIGHT HALLUX METATARSAL PHALANGEAL JOINT ARTHRODESIS ;  Surgeon: Wylene Simmer, MD;  Location: [facility];  Service: Orthopedics;  Laterality: Right;  . INGUINAL HERNIA REPAIR  age 64   rt side  . NASAL CONCHA BULLOSA RESECTION  age 89  . PROSTATE SURGERY    . SHOULDER ARTHROSCOPY W/ ROTATOR CUFF REPAIR Right    early 2000s  . tonsil    . VASECTOMY  age 89    Social History   Socioeconomic History  . Marital status: Married    Spouse name: Not on file  . Number of children: Not on file  . Years of education: Not on file  . Highest education level: Not on file  Occupational History  . Occupation: RETIRED    Employer: RETIRED  Social Needs  . Financial resource strain: Not on file  . Food insecurity:    Worry: Not on file    Inability: Not on file  . Transportation needs:    Medical: Not on file    Non-medical: Not on file  Tobacco Use  . Smoking status: Former Smoker    Packs/day: 2.00    Years: 10.00    Pack years: 20.00    Types: Cigarettes    Last attempt to quit: 07/18/1975    Years since quitting: 42.2  . Smokeless tobacco: Never Used  Substance and Sexual Activity  . Alcohol use: Yes    Comment: 1-2 drinks/day  . Drug use: No  . Sexual activity: Not  on file  Lifestyle  . Physical activity:    Days per week: Not on file    Minutes per session: Not on file  . Stress: Not on file  Relationships  . Social connections:    Talks on phone: Not on file    Gets together: Not on file    Attends religious service: Not on file    Active member of club or organization: Not on file    Attends meetings of clubs or organizations: Not on file    Relationship status: Not on file  Other Topics Concern  . Not on file  Social  History Narrative  . Not on file    Family History  Problem Relation Age of Onset  . Coronary artery disease Brother   . Heart disease Father   . Lung cancer Father   . Kidney cancer Father   . Prostate cancer Father   . Arthritis Mother   . Lung cancer Mother     Review of Systems  Constitutional: Positive for fatigue (yesterday with Afib). Negative for chills and fever.  Respiratory: Negative for cough, shortness of breath and wheezing.   Cardiovascular: Positive for palpitations (rarely) and leg swelling (chronic, unchanged). Negative for chest pain.  Musculoskeletal: Positive for back pain.  Neurological: Positive for headaches (occ). Negative for dizziness and light-headedness.       Objective:   Vitals:   10/14/17 0955  BP: 134/62  Pulse: 70  Resp: 16  Temp: 98.2 F (36.8 C)  SpO2: 96%   BP Readings from Last 3 Encounters:  10/14/17 134/62  08/10/17 130/72  08/09/17 134/70   Wt Readings from Last 3 Encounters:  10/14/17 210 lb (95.3 kg)  08/10/17 207 lb (93.9 kg)  08/09/17 207 lb (93.9 kg)   Body mass index is 33.39 kg/m.   Physical Exam    Constitutional: Appears well-developed and well-nourished. No distress.  HENT:  Head: Normocephalic and atraumatic.  Neck: Neck supple. No tracheal deviation present. No thyromegaly present.  No cervical lymphadenopathy Cardiovascular: Normal rate, regular rhythm and normal heart sounds.   No murmur heard. No carotid bruit .  No edema Pulmonary/Chest: Effort normal and breath sounds normal. No respiratory distress. No has no wheezes. No rales.  Skin: Skin is warm and dry. Not diaphoretic.  Psychiatric: Normal mood and affect. Behavior is normal.      Assessment & Plan:    See Problem List for Assessment and Plan of chronic medical problems.

## 2017-10-11 ENCOUNTER — Other Ambulatory Visit: Payer: Self-pay | Admitting: Cardiology

## 2017-10-12 NOTE — Telephone Encounter (Signed)
Rx(s) sent to pharmacy electronically.  

## 2017-10-14 ENCOUNTER — Ambulatory Visit (INDEPENDENT_AMBULATORY_CARE_PROVIDER_SITE_OTHER): Payer: Medicare Other | Admitting: Internal Medicine

## 2017-10-14 ENCOUNTER — Encounter: Payer: Self-pay | Admitting: Internal Medicine

## 2017-10-14 ENCOUNTER — Other Ambulatory Visit (INDEPENDENT_AMBULATORY_CARE_PROVIDER_SITE_OTHER): Payer: Medicare Other

## 2017-10-14 ENCOUNTER — Ambulatory Visit (INDEPENDENT_AMBULATORY_CARE_PROVIDER_SITE_OTHER)
Admission: RE | Admit: 2017-10-14 | Discharge: 2017-10-14 | Disposition: A | Discharge status: Home / Self Care | Payer: Medicare Other | Source: Ambulatory Visit | Attending: Internal Medicine | Admitting: Internal Medicine

## 2017-10-14 VITALS — BP 134/62 | HR 70 | Temp 98.2°F | Resp 16 | Wt 210.0 lb

## 2017-10-14 DIAGNOSIS — E038 Other specified hypothyroidism: Secondary | ICD-10-CM

## 2017-10-14 DIAGNOSIS — E785 Hyperlipidemia, unspecified: Secondary | ICD-10-CM

## 2017-10-14 DIAGNOSIS — G609 Hereditary and idiopathic neuropathy, unspecified: Secondary | ICD-10-CM | POA: Diagnosis not present

## 2017-10-14 DIAGNOSIS — E119 Type 2 diabetes mellitus without complications: Secondary | ICD-10-CM | POA: Diagnosis not present

## 2017-10-14 DIAGNOSIS — I480 Paroxysmal atrial fibrillation: Secondary | ICD-10-CM

## 2017-10-14 DIAGNOSIS — I10 Essential (primary) hypertension: Secondary | ICD-10-CM | POA: Diagnosis not present

## 2017-10-14 DIAGNOSIS — M810 Age-related osteoporosis without current pathological fracture: Secondary | ICD-10-CM | POA: Diagnosis not present

## 2017-10-14 DIAGNOSIS — K219 Gastro-esophageal reflux disease without esophagitis: Secondary | ICD-10-CM

## 2017-10-14 LAB — CBC WITH DIFFERENTIAL/PLATELET
Basophils Absolute: 0 10*3/uL (ref 0.0–0.1)
Basophils Relative: 0.3 % (ref 0.0–3.0)
Eosinophils Absolute: 0 10*3/uL (ref 0.0–0.7)
Eosinophils Relative: 0.3 % (ref 0.0–5.0)
HCT: 41.7 % (ref 39.0–52.0)
Hemoglobin: 13.9 g/dL (ref 13.0–17.0)
Lymphocytes Relative: 35.7 % (ref 12.0–46.0)
Lymphs Abs: 1.9 10*3/uL (ref 0.7–4.0)
MCHC: 33.4 g/dL (ref 30.0–36.0)
MCV: 91.7 fl (ref 78.0–100.0)
Monocytes Absolute: 1 10*3/uL (ref 0.1–1.0)
Monocytes Relative: 18.9 % — ABNORMAL HIGH (ref 3.0–12.0)
Neutro Abs: 2.4 10*3/uL (ref 1.4–7.7)
Neutrophils Relative %: 44.8 % (ref 43.0–77.0)
Platelets: 137 10*3/uL — ABNORMAL LOW (ref 150.0–400.0)
RBC: 4.55 Mil/uL (ref 4.22–5.81)
RDW: 13.4 % (ref 11.5–15.5)
WBC: 5.3 10*3/uL (ref 4.0–10.5)

## 2017-10-14 LAB — COMPREHENSIVE METABOLIC PANEL
ALT: 19 U/L (ref 0–53)
AST: 16 U/L (ref 0–37)
Albumin: 4.3 g/dL (ref 3.5–5.2)
Alkaline Phosphatase: 72 U/L (ref 39–117)
BUN: 20 mg/dL (ref 6–23)
CO2: 30 mEq/L (ref 19–32)
Calcium: 9.7 mg/dL (ref 8.4–10.5)
Chloride: 101 mEq/L (ref 96–112)
Creatinine, Ser: 0.92 mg/dL (ref 0.40–1.50)
GFR: 83.64 mL/min (ref >60.00)
Glucose, Bld: 191 mg/dL — ABNORMAL HIGH (ref 70–99)
Potassium: 3.7 mEq/L (ref 3.5–5.1)
Sodium: 141 mEq/L (ref 135–145)
Total Bilirubin: 0.5 mg/dL (ref 0.2–1.2)
Total Protein: 6.4 g/dL (ref 6.0–8.3)

## 2017-10-14 LAB — LIPID PANEL
Cholesterol: 117 mg/dL (ref 0–200)
HDL: 39.6 mg/dL (ref >39.00)
LDL Cholesterol: 38 mg/dL (ref 0–99)
NonHDL: 77.44
Total CHOL/HDL Ratio: 3
Triglycerides: 195 mg/dL — ABNORMAL HIGH (ref 0.0–149.0)
VLDL: 39 mg/dL (ref 0.0–40.0)

## 2017-10-14 LAB — HEMOGLOBIN A1C: Hgb A1c MFr Bld: 7.2 % — ABNORMAL HIGH (ref 4.6–6.5)

## 2017-10-14 LAB — TSH: TSH: 1.83 u[IU]/mL (ref 0.35–4.50)

## 2017-10-14 MED ORDER — ZALEPLON 10 MG PO CAPS: 10.0000 mg | ORAL_CAPSULE | Freq: Every evening | ORAL | 0 refills | Status: DC | PRN

## 2017-10-14 MED ORDER — TIZANIDINE HCL 4 MG PO CAPS: 4.0000 mg | ORAL_CAPSULE | Freq: Every day | ORAL | 1 refills | Status: DC

## 2017-10-14 MED ORDER — BLOOD GLUCOSE MONITOR KIT: PACK | 0 refills | Status: DC

## 2017-10-14 NOTE — Assessment & Plan Note (Addendum)
Taking metformin - takes it at least once a day sometimes forgets to take it twice a day Consider ER metformin Check A1c today

## 2017-10-14 NOTE — Assessment & Plan Note (Signed)
Will check DEXA-to be scheduled today Continue Fosamax-May need to consider Prolia given noncompliance

## 2017-10-14 NOTE — Patient Instructions (Addendum)
  Test(s) ordered today. Your results will be released to MyChart (or called to you) after review, usually within 72hours after test completion. If any changes need to be made, you will be notified at that same time.   Medications reviewed and updated.  No changes recommended at this time.    Please followup in 4 months   

## 2017-10-14 NOTE — Assessment & Plan Note (Signed)
Check lipid panel  Continue daily statin  

## 2017-10-14 NOTE — Assessment & Plan Note (Signed)
BP well controlled Current regimen effective and well tolerated Continue current medications at current doses cmp  

## 2017-10-14 NOTE — Assessment & Plan Note (Addendum)
Lyrica not covered by insurance currently Not well controlled w/o lyrica - difficulty falling asleep due to pain and staying asleep Taking cymbalta Was on gabapentin in past - not as effective a lyrica

## 2017-10-14 NOTE — Assessment & Plan Note (Signed)
Paroxysmal - last episode yesterday Rate controlled Will discuss anti-coagulation with Dr Stanford Breed - not currently taking Eliquis due to cost

## 2017-10-14 NOTE — Assessment & Plan Note (Signed)
GERD controlled Continue daily medication  

## 2017-10-14 NOTE — Assessment & Plan Note (Signed)
Check tsh  Titrate med dose if needed  

## 2017-10-15 MED ORDER — ZALEPLON 10 MG PO CAPS: 10.0000 mg | ORAL_CAPSULE | Freq: Every evening | ORAL | 0 refills | Status: DC | PRN

## 2017-10-15 NOTE — Telephone Encounter (Signed)
Please send to [facility]. Requesting Flexeril too.

## 2017-10-17 ENCOUNTER — Other Ambulatory Visit: Payer: Self-pay | Admitting: Internal Medicine

## 2017-10-17 ENCOUNTER — Encounter: Payer: Self-pay | Admitting: Internal Medicine

## 2017-10-18 ENCOUNTER — Other Ambulatory Visit: Payer: Self-pay | Admitting: Emergency Medicine

## 2017-10-20 ENCOUNTER — Other Ambulatory Visit: Payer: Self-pay | Admitting: Internal Medicine

## 2017-10-27 ENCOUNTER — Other Ambulatory Visit: Payer: Self-pay | Admitting: Internal Medicine

## 2017-11-03 ENCOUNTER — Other Ambulatory Visit: Payer: Self-pay | Admitting: Internal Medicine

## 2017-11-10 ENCOUNTER — Encounter: Payer: Self-pay | Admitting: Pulmonary Disease

## 2017-11-10 ENCOUNTER — Ambulatory Visit (INDEPENDENT_AMBULATORY_CARE_PROVIDER_SITE_OTHER): Payer: Medicare Other | Admitting: Pulmonary Disease

## 2017-11-10 VITALS — BP 124/72 | HR 74 | Ht 66.0 in | Wt 208.0 lb

## 2017-11-10 DIAGNOSIS — J441 Chronic obstructive pulmonary disease with (acute) exacerbation: Secondary | ICD-10-CM

## 2017-11-10 MED ORDER — AZITHROMYCIN 250 MG PO TABS: ORAL_TABLET | ORAL | 0 refills | Status: DC

## 2017-11-10 NOTE — Progress Notes (Signed)
Chief Complaint  Patient presents with  . Acute Visit    Pt has increase of productive cough-yellow, SOB with exertion, fatigue and not sleeping at all due to congestion.    HPI: 84 yo male former smoker COPD, OSA.  He has more cough with yellow sputum.  He is feeling more short of breath and fatigued.  He has felt warm.  Denies sinus congestion, ear pain, sore throat, sweats, nausea, diarrhea, skin rash, or swelling.  Uses stiolto and mucinex.  Hasn't used xopenex.  He  has a past medical history of Atrial fibrillation ([place]), BPH (benign prostatic hyperplasia), Bronchitis, Cataract, COPD (chronic obstructive pulmonary disease) ([place]), Depression, Fatty tumor, Fibromyalgia, GERD (gastroesophageal reflux disease), Glaucoma, Gout, Hereditary and idiopathic peripheral neuropathy (08/28/2015), Hypertension, Hypothyroidism, Hypoxia, Insomnia, Memory disorder (08/28/2015), On home oxygen therapy, Osteoarthritis, Osteoporosis, Peripheral neuropathy, Rotator cuff tear, left, Sleep apnea, Spinal compression fracture ([place]) (seventh vertebre), Transient alteration of awareness (08/28/2015), Type 2 diabetes mellitus ([place]) (03/08/2016), and Wears glasses.  He  has a past surgical history that includes tonsil; Appendectomy (age 67); Nasal concha bullosa resection (age 43); Vasectomy (age 42); Inguinal hernia repair (age 10); Cholecystectomy (age 81); Cataract extraction; Eye surgery; Prostate surgery; Shoulder arthroscopy w/ rotator cuff repair (Right); Carpal tunnel release (Right); and Foot arthrodesis (Right, 02/02/2013).  His family history includes Arthritis in his mother; Coronary artery disease in his brother; Heart disease in his father; Kidney cancer in his father; Lung cancer in his father and mother; Prostate cancer in his father.  He  reports that he quit smoking about 42 years ago. His smoking use included cigarettes. He has a 20.00 pack-year smoking history. He has never used smokeless tobacco. He reports  that he drinks alcohol. He reports that he does not use drugs.   Allergies as of 11/10/2017   No Known Allergies     Medication List        Accurate as of 11/10/17  2:47 PM. Always use your most recent med list.          acetaminophen 500 MG tablet Commonly known as:  TYLENOL Take 2 tablets (1,000 mg total) by mouth every 8 (eight) hours as needed.   allopurinol 300 MG tablet Commonly known as:  ZYLOPRIM TAKE 1 TABLET BY MOUTH  EVERY EVENING   atorvastatin 20 MG tablet Commonly known as:  LIPITOR TAKE 1 TABLET BY MOUTH  DAILY   azithromycin 250 MG tablet Commonly known as:  ZITHROMAX 2 pills on day 1, then 1 pill daily for next 4 days   Biotin 5000 MCG Tabs Take 5,000 mcg by mouth daily.   blood glucose meter kit and supplies Kit Dispense based on patient and insurance preference. Use up to four times daily as directed. (FOR E11.9).   brimonidine 0.15 % ophthalmic solution Commonly known as:  ALPHAGAN Place 1 drop into the left eye 2 (two) times daily.   Calcium Lactate 750 MG Tabs Take 1 tablet by mouth daily.   COLCRYS 0.6 MG tablet Generic drug:  colchicine Take 0.6 mg by mouth daily as needed (flareups).   CYMBALTA PO Take 90 mg daily by mouth.   DULoxetine 30 MG capsule Commonly known as:  CYMBALTA TAKE 2 CAPSULES BY MOUTH IN THE MORNING AND 1 CAPSULE  BY MOUTH IN THE EVENING   diltiazem 300 MG 24 hr capsule Commonly known as:  CARDIZEM CD Take 1 capsule (300 mg total) daily by mouth.   doxazosin 8 MG tablet Commonly known as:  CARDURA TAKE 1 TABLET BY MOUTH  DAILY   ELIQUIS 5 MG Tabs tablet Generic drug:  apixaban TAKE 1 TABLET BY MOUTH TWO  TIMES DAILY   EQL POTASSIUM GLUCONATE 595 (99 K) MG Tabs tablet Generic drug:  potassium gluconate Take 595 mg by mouth daily.   hydrochlorothiazide 25 MG tablet Commonly known as:  HYDRODIURIL TAKE 1 TABLET BY MOUTH  DAILY   latanoprost 0.005 % ophthalmic solution Commonly known as:  XALATAN Place  1 drop into both eyes at bedtime.   levalbuterol 45 MCG/ACT inhaler Commonly known as:  XOPENEX HFA Inhale 1-2 puffs into the lungs every 4 (four) hours as needed for wheezing.   levothyroxine 100 MCG tablet Commonly known as:  SYNTHROID, LEVOTHROID TAKE 1 TABLET BY MOUTH  DAILY   magnesium oxide 400 MG tablet Commonly known as:  MAG-OX Take 400 mg by mouth daily.   metFORMIN 500 MG tablet Commonly known as:  GLUCOPHAGE Take 1 tablet (500 mg total) by mouth 2 (two) times daily with a meal.   mirabegron ER 25 MG Tb24 tablet Commonly known as:  MYRBETRIQ Take 1 tablet (25 mg total) daily by mouth.   multivitamin tablet Take 1 tablet by mouth daily.   mupirocin cream 2 % Commonly known as:  BACTROBAN Apply 1 application topically 2 (two) times daily.   nitroGLYCERIN 0.4 MG SL tablet Commonly known as:  NITROSTAT Place 1 tablet (0.4 mg total) under the tongue every 5 (five) minutes as needed for chest pain.   omeprazole 20 MG capsule Commonly known as:  PRILOSEC Take 1 capsule (20 mg total) by mouth daily.   oxyCODONE 10 mg 12 hr tablet Commonly known as:  OXYCONTIN Take 10 mg by mouth every 12 (twelve) hours as needed.   oxyCODONE 15 MG immediate release tablet Commonly known as:  ROXICODONE Take 15 mg by mouth every 4 (four) hours as needed for pain.   tadalafil 20 MG tablet Commonly known as:  ADCIRCA/CIALIS Take 1 tablet (20 mg total) by mouth daily as needed. For erectile dysfunction   Tiotropium Bromide-Olodaterol 2.5-2.5 MCG/ACT Aers Commonly known as:  STIOLTO RESPIMAT Inhale 2 puffs into the lungs daily.   tiZANidine 4 MG capsule Commonly known as:  ZANAFLEX Take 1 capsule (4 mg total) by mouth at bedtime.   vitamin C 500 MG tablet Commonly known as:  ASCORBIC ACID Take 1,000 mg by mouth daily.   VITAMIN D (CHOLECALCIFEROL) PO Take 50 mcg by mouth daily.   zaleplon 10 MG capsule Commonly known as:  SONATA Take 1 capsule (10 mg total) by mouth at  bedtime as needed for sleep.       Vital signs: BP 124/72 (BP Location: Left Arm, Cuff Size: Normal)   Pulse 74   Ht 5' 6"  (1.676 m)   Wt 208 lb (94.3 kg)   SpO2 96%   BMI 33.57 kg/m   Physical exam:  General - pleasant Eyes - pupils reactive ENT - no sinus tenderness, no oral exudate, no LAN Cardiac - decreased BS, faint rhonchi b/l that clear with cough, no wheeze Chest - no wheeze, rales Abd - soft, non tender Ext - no edema Skin - no rashes Neuro - normal strength Psych - normal mood   Echo 02/08/17 >> EF 60 to 65%, grade 2 DD CXR 07/23/17 >> basilar ATX   CMP Latest Ref Rng & Units 10/14/2017 08/09/2017 03/10/2017  Glucose 70 - 99 mg/dL 191(H) 129(H) 141(H)  BUN 6 - 23 mg/dL  20 18 26(H)  Creatinine 0.40 - 1.50 mg/dL 0.92 1.03 1.03  Sodium 135 - 145 mEq/L 141 140 141  Potassium 3.5 - 5.1 mEq/L 3.7 3.5 3.7  Chloride 96 - 112 mEq/L 101 98 101  CO2 19 - 32 mEq/L 30 31 29   Calcium 8.4 - 10.5 mg/dL 9.7 10.0 9.9  Total Protein 6.0 - 8.3 g/dL 6.4 6.8 6.7  Total Bilirubin 0.2 - 1.2 mg/dL 0.5 0.5 0.5  Alkaline Phos 39 - 117 U/L 72 84 71  AST 0 - 37 U/L 16 21 20   ALT 0 - 53 U/L 19 23 17     CBC Latest Ref Rng & Units 10/14/2017 08/09/2017 01/26/2017  WBC 4.0 - 10.5 K/uL 5.3 5.5 5.0  Hemoglobin 13.0 - 17.0 g/dL 13.9 14.2 13.3  Hematocrit 39.0 - 52.0 % 41.7 41.9 38.8  Platelets 150.0 - 400.0 K/uL 137.0(L) 139.0(L) 143(L)      Assessment/plan:  COPD exacerbation. - will give course of zithromax - don't think he needs CXR or prednisone at this time - continue stiolto - continue mucinex prn - advised he could try using xopenex more when he has flares like this   Patient Instructions  Zithromax 250 mg pill >> 2 pills on day 1, then 1 pill daily on next 4 days  Call if not feeling better  Follow up with Dr. Lamonte Sakai in 6 months

## 2017-11-10 NOTE — Patient Instructions (Signed)
Zithromax 250 mg pill >> 2 pills on day 1, then 1 pill daily on next 4 days  Call if not feeling better  Follow up with Dr. Lamonte Sakai in 6 months

## 2017-11-30 DIAGNOSIS — M545 Low back pain: Secondary | ICD-10-CM | POA: Diagnosis not present

## 2017-11-30 DIAGNOSIS — M47896 Other spondylosis, lumbar region: Secondary | ICD-10-CM | POA: Diagnosis not present

## 2017-12-01 DIAGNOSIS — H0012 Chalazion right lower eyelid: Secondary | ICD-10-CM | POA: Diagnosis not present

## 2017-12-01 DIAGNOSIS — H401131 Primary open-angle glaucoma, bilateral, mild stage: Secondary | ICD-10-CM | POA: Diagnosis not present

## 2017-12-01 DIAGNOSIS — H524 Presbyopia: Secondary | ICD-10-CM | POA: Diagnosis not present

## 2017-12-01 DIAGNOSIS — H0100B Unspecified blepharitis left eye, upper and lower eyelids: Secondary | ICD-10-CM | POA: Diagnosis not present

## 2017-12-01 LAB — HM DIABETES EYE EXAM

## 2017-12-02 DIAGNOSIS — M545 Low back pain: Secondary | ICD-10-CM | POA: Diagnosis not present

## 2017-12-02 DIAGNOSIS — M47896 Other spondylosis, lumbar region: Secondary | ICD-10-CM | POA: Diagnosis not present

## 2017-12-21 ENCOUNTER — Ambulatory Visit (INDEPENDENT_AMBULATORY_CARE_PROVIDER_SITE_OTHER): Payer: Medicare Other | Admitting: Cardiology

## 2017-12-21 ENCOUNTER — Encounter: Payer: Self-pay | Admitting: Cardiology

## 2017-12-21 DIAGNOSIS — E119 Type 2 diabetes mellitus without complications: Secondary | ICD-10-CM

## 2017-12-21 DIAGNOSIS — J441 Chronic obstructive pulmonary disease with (acute) exacerbation: Secondary | ICD-10-CM

## 2017-12-21 DIAGNOSIS — Z7901 Long term (current) use of anticoagulants: Secondary | ICD-10-CM | POA: Insufficient documentation

## 2017-12-21 DIAGNOSIS — I10 Essential (primary) hypertension: Secondary | ICD-10-CM | POA: Diagnosis not present

## 2017-12-21 DIAGNOSIS — Z79899 Other long term (current) drug therapy: Secondary | ICD-10-CM | POA: Diagnosis not present

## 2017-12-21 DIAGNOSIS — G4730 Sleep apnea, unspecified: Secondary | ICD-10-CM | POA: Diagnosis not present

## 2017-12-21 DIAGNOSIS — I480 Paroxysmal atrial fibrillation: Secondary | ICD-10-CM | POA: Diagnosis not present

## 2017-12-21 HISTORY — DX: Long term (current) use of anticoagulants: Z79.01

## 2017-12-21 NOTE — Assessment & Plan Note (Signed)
Followed by Dr Sood 

## 2017-12-21 NOTE — Assessment & Plan Note (Signed)
Controlled.  

## 2017-12-21 NOTE — Assessment & Plan Note (Signed)
Followed by PCP

## 2017-12-21 NOTE — Progress Notes (Signed)
12/21/2017 [name]   July 05, 1932  [id number]  Primary Physician Binnie Rail, MD Primary Cardiologist: Dr Stanford Breed  HPI:  84 y/o overweight caucasian male followed by Dr Stanford Breed with a history of PAF. He has been holding NSR as far as we know going all the way back to 2013. He has stopped Eliquis- none in the last month- he says he can't afford it (he is on 30 medications).  He recently joined the [employer]. Yesterday he was doing water walker in the pool. After about 20 minutes he says he became dizzy and his short term memory was affected for 24 hours. He has a history of some memory problems and also a history of "transient alteration of awareness". He was concerned he might have had a stroke. He denies any trouble with his speech or any arm, leg weakness. His symptoms are better this am. He does not know if he was in AF (he hasn't been able to tell in the past) but he denied any palpitations or tachycardia. His EKG today shows NSR-60 with a PVC.    Current Outpatient Medications  Medication Sig Dispense Refill  . acetaminophen (TYLENOL) 500 MG tablet Take 2 tablets (1,000 mg total) by mouth every 8 (eight) hours as needed. 360 tablet 1  . allopurinol (ZYLOPRIM) 300 MG tablet TAKE 1 TABLET BY MOUTH  EVERY EVENING 90 tablet 1  . atorvastatin (LIPITOR) 20 MG tablet TAKE 1 TABLET BY MOUTH  DAILY 90 tablet 3  . Biotin 5000 MCG TABS Take 5,000 mcg by mouth daily.    . blood glucose meter kit and supplies KIT Dispense based on patient and insurance preference. Use up to four times daily as directed. (FOR E11.9). 1 each 0  . brimonidine (ALPHAGAN) 0.15 % ophthalmic solution Place 1 drop into the left eye 2 (two) times daily.     . Calcium Lactate 750 MG TABS Take 1 tablet by mouth daily.     . colchicine (COLCRYS) 0.6 MG tablet Take 0.6 mg by mouth daily as needed (flareups).     . diltiazem (CARDIZEM CD) 300 MG 24 hr capsule Take 1 capsule (300 mg total) daily by mouth. 90 capsule 3  .  doxazosin (CARDURA) 8 MG tablet TAKE 1 TABLET BY MOUTH  DAILY 90 tablet 1  . DULoxetine (CYMBALTA) 30 MG capsule TAKE 2 CAPSULES BY MOUTH IN THE MORNING AND 1 CAPSULE  BY MOUTH IN THE EVENING 270 capsule 1  . hydrochlorothiazide (HYDRODIURIL) 25 MG tablet TAKE 1 TABLET BY MOUTH  DAILY 90 tablet 3  . latanoprost (XALATAN) 0.005 % ophthalmic solution Place 1 drop into both eyes at bedtime. 7.5 mL 3  . levalbuterol (XOPENEX HFA) 45 MCG/ACT inhaler Inhale 1-2 puffs into the lungs every 4 (four) hours as needed for wheezing. 1 Inhaler 12  . levothyroxine (SYNTHROID, LEVOTHROID) 100 MCG tablet TAKE 1 TABLET BY MOUTH  DAILY 90 tablet 1  . magnesium oxide (MAG-OX) 400 MG tablet Take 400 mg by mouth daily.    . metFORMIN (GLUCOPHAGE) 500 MG tablet Take 1 tablet (500 mg total) by mouth 2 (two) times daily with a meal. 180 tablet 3  . Multiple Vitamin (MULTIVITAMIN) tablet Take 1 tablet by mouth daily.    . mupirocin cream (BACTROBAN) 2 % Apply 1 application topically 2 (two) times daily. 15 g 0  . nitroGLYCERIN (NITROSTAT) 0.4 MG SL tablet Place 1 tablet (0.4 mg total) under the tongue every 5 (five) minutes as needed for chest  pain. 90 tablet 3  . omeprazole (PRILOSEC) 20 MG capsule Take 1 capsule (20 mg total) by mouth daily. 90 capsule 1  . oxyCODONE (OXYCONTIN) 10 mg 12 hr tablet Take 10 mg by mouth every 12 (twelve) hours as needed.    Marland Kitchen oxyCODONE (ROXICODONE) 15 MG immediate release tablet Take 15 mg by mouth every 4 (four) hours as needed for pain.    . potassium gluconate (EQL POTASSIUM GLUCONATE) 595 (99 K) MG TABS tablet Take 595 mg by mouth daily.    . tadalafil (CIALIS) 20 MG tablet Take 1 tablet (20 mg total) by mouth daily as needed. For erectile dysfunction 10 tablet 1  . Tiotropium Bromide-Olodaterol (STIOLTO RESPIMAT) 2.5-2.5 MCG/ACT AERS Inhale 2 puffs into the lungs daily. 3 Inhaler 3  . tiZANidine (ZANAFLEX) 4 MG capsule Take 1 capsule (4 mg total) by mouth at bedtime. 90 capsule 1  .  vitamin C (ASCORBIC ACID) 500 MG tablet Take 1,000 mg by mouth daily.     Marland Kitchen VITAMIN D, CHOLECALCIFEROL, PO Take 50 mcg by mouth daily.    . zaleplon (SONATA) 10 MG capsule Take 1 capsule (10 mg total) by mouth at bedtime as needed for sleep. 90 capsule 0  . azithromycin (ZITHROMAX) 250 MG tablet 2 pills on day 1, then 1 pill daily for next 4 days 6 tablet 0  . DULoxetine HCl (CYMBALTA PO) Take 90 mg daily by mouth.    Arne Cleveland 5 MG TABS tablet TAKE 1 TABLET BY MOUTH TWO  TIMES DAILY (Patient not taking: Reported on 12/21/2017) 180 tablet 1  . mirabegron ER (MYRBETRIQ) 25 MG TB24 tablet Take 1 tablet (25 mg total) daily by mouth. (Patient not taking: Reported on 12/21/2017) 90 tablet 1   No current facility-administered medications for this visit.     No Known Allergies  Past Medical History:  Diagnosis Date  . Atrial fibrillation (HCC)    a. paroxysmal, on Eliquis for anticoagulation  . BPH (benign prostatic hyperplasia)   . Bronchitis   . Cataract   . COPD (chronic obstructive pulmonary disease) (HCC)    2 liters O2 HS  . Depression   . Fatty tumor    waste and back  . Fibromyalgia   . GERD (gastroesophageal reflux disease)   . Glaucoma    bilateral eyes  . Gout   . Hereditary and idiopathic peripheral neuropathy 08/28/2015  . Hypertension   . Hypothyroidism   . Hypoxia   . Insomnia   . Memory disorder 08/28/2015  . On home oxygen therapy    at night with cpap  . Osteoarthritis   . Osteoporosis   . Peripheral neuropathy   . Rotator cuff tear, left   . Sleep apnea    uses cpap-add oxygen at night  . Spinal compression fracture (HCC) seventh vertebre  . Transient alteration of awareness 08/28/2015  . Type 2 diabetes mellitus ([place]) 03/08/2016  . Wears glasses     Social History   Socioeconomic History  . Marital status: Married    Spouse name: Not on file  . Number of children: Not on file  . Years of education: Not on file  . Highest education level: Not on file    Occupational History  . Occupation: RETIRED    Employer: RETIRED  Social Needs  . Financial resource strain: Not on file  . Food insecurity:    Worry: Not on file    Inability: Not on file  . Transportation needs:  Medical: Not on file    Non-medical: Not on file  Tobacco Use  . Smoking status: Former Smoker    Packs/day: 2.00    Years: 10.00    Pack years: 20.00    Types: Cigarettes    Last attempt to quit: 07/18/1975    Years since quitting: 42.4  . Smokeless tobacco: Never Used  Substance and Sexual Activity  . Alcohol use: Yes    Comment: 1-2 drinks/day  . Drug use: No  . Sexual activity: Not on file  Lifestyle  . Physical activity:    Days per week: Not on file    Minutes per session: Not on file  . Stress: Not on file  Relationships  . Social connections:    Talks on phone: Not on file    Gets together: Not on file    Attends religious service: Not on file    Active member of club or organization: Not on file    Attends meetings of clubs or organizations: Not on file    Relationship status: Not on file  . Intimate partner violence:    Fear of current or ex partner: Not on file    Emotionally abused: Not on file    Physically abused: Not on file    Forced sexual activity: Not on file  Other Topics Concern  . Not on file  Social History Narrative  . Not on file     Family History  Problem Relation Age of Onset  . Coronary artery disease Brother   . Heart disease Father   . Lung cancer Father   . Kidney cancer Father   . Prostate cancer Father   . Arthritis Mother   . Lung cancer Mother      Review of Systems: General: negative for chills, fever, night sweats or weight changes.  Cardiovascular: negative for chest pain, dyspnea on exertion, edema, orthopnea, palpitations, paroxysmal nocturnal dyspnea or shortness of breath Dermatological: negative for rash Respiratory: negative for cough or wheezing Urologic: negative for hematuria Abdominal:  negative for nausea, vomiting, diarrhea, bright red blood per rectum, melena, or hematemesis Neurologic: negative for visual changes, syncope, or dizziness All other systems reviewed and are otherwise negative except as noted above.    Blood pressure (!) 146/73, pulse 60, height 5' 7"  (1.702 m), weight 203 lb 9.6 oz (92.4 kg).  General appearance: alert, cooperative, no distress and moderately obese Neck: no carotid bruit and no JVD Lungs: clear to auscultation bilaterally Heart: regular rate and rhythm Extremities: extremities normal, atraumatic, no cyanosis or edema Skin: Skin color, texture, turgor normal. No rashes or lesions Neurologic: Grossly normal  EKG NSR, septal Qs-old  ASSESSMENT AND PLAN:   PAF (paroxysmal atrial fibrillation) (HCC) a. paroxysmal, on Eliquis for anticoagulation  Chronic anticoagulation CHADS VASC=4, on Eliquis  Essential hypertension Controlled  Sleep apnea uses cpap-add oxygen at night  Type 2 diabetes mellitus ([place]) Followed by PCP  COPD with acute exacerbation ([place]) Followed by Dr Halford Chessman  Polypharmacy Pt is on 30 medications   PLAN  I suggested we obtain an event monitor. I wonder if he didn't have transient bradycardia. I doubt he had a stroke based on his exam today. He did have an MRI in July 2018 that did not show any evidence of acute or chronic stroke. I did provide him with samples of Eliquis and encouraged him to finish filling out his medication assistance form- started some time ago. F/U after his Event monitor. He know s to  go to the ED if he has any localized weakness.  Kerin Ransom PA-C 12/21/2017 2:15 PM

## 2017-12-21 NOTE — Assessment & Plan Note (Signed)
CHADS VASC=4, on Eliquis 

## 2017-12-21 NOTE — Assessment & Plan Note (Signed)
a. paroxysmal, on Eliquis for anticoagulation

## 2017-12-21 NOTE — Patient Instructions (Signed)
Medication Instructions: Your physician recommends that you continue on your current medications as directed. Please refer to the Current Medication list given to you today.  Samples of Eliquis 5 mg given today.   Testing/Procedures: Your physician has recommended that you wear a 14 day event monitor. Event monitors are medical devices that record the heart's electrical activity. Doctors most often [place] these monitors to diagnose arrhythmias. Arrhythmias are problems with the speed or rhythm of the heartbeat. The monitor is a small, portable device. You can wear one while you do your normal daily activities. This is usually used to diagnose what is causing palpitations/syncope (passing out).  Follow-Up: Your physician recommends that you schedule a follow-up appointment in: 1 month with Kerin Ransom, PA (or sooner depending on results).  If you need a refill on your cardiac medications before your next appointment, please call your pharmacy.

## 2017-12-21 NOTE — Assessment & Plan Note (Signed)
uses cpap-add oxygen at night

## 2017-12-21 NOTE — Assessment & Plan Note (Signed)
Pt is on 30 medications

## 2017-12-27 ENCOUNTER — Other Ambulatory Visit: Payer: Self-pay | Admitting: Internal Medicine

## 2017-12-27 DIAGNOSIS — M545 Low back pain: Secondary | ICD-10-CM | POA: Diagnosis not present

## 2017-12-27 DIAGNOSIS — M47896 Other spondylosis, lumbar region: Secondary | ICD-10-CM | POA: Diagnosis not present

## 2017-12-28 ENCOUNTER — Other Ambulatory Visit: Payer: Self-pay | Admitting: Internal Medicine

## 2017-12-28 ENCOUNTER — Encounter: Payer: Self-pay | Admitting: Cardiology

## 2017-12-28 NOTE — Telephone Encounter (Signed)
Sonata refill Last OV:08/09/17 Last refill:10/15/17 90 cap/0 refill EJY:LTEIH Pharmacy: [id number] [address], [place] (Phone) [phone] (Fax)

## 2017-12-28 NOTE — Telephone Encounter (Signed)
Copied from [place] [id number]. Topic: Quick Communication - See Telephone Encounter >> Dec 28, 2017 11:35 AM Mylinda Latina, NT wrote: CRM for notification. See Telephone encounter for: 12/28/17. Patient called and states he needs a refill of his zaleplon (SONATA) 10 MG capsule.  [id number] [address], [place] (Phone) [phone] (Fax)

## 2017-12-29 ENCOUNTER — Ambulatory Visit (INDEPENDENT_AMBULATORY_CARE_PROVIDER_SITE_OTHER): Payer: Medicare Other

## 2017-12-29 DIAGNOSIS — I480 Paroxysmal atrial fibrillation: Secondary | ICD-10-CM | POA: Diagnosis not present

## 2017-12-29 MED ORDER — ZALEPLON 10 MG PO CAPS: 10.0000 mg | ORAL_CAPSULE | Freq: Every evening | ORAL | 0 refills | Status: DC | PRN

## 2017-12-30 DIAGNOSIS — M545 Low back pain: Secondary | ICD-10-CM | POA: Diagnosis not present

## 2017-12-30 DIAGNOSIS — M47896 Other spondylosis, lumbar region: Secondary | ICD-10-CM | POA: Diagnosis not present

## 2018-01-04 ENCOUNTER — Ambulatory Visit (INDEPENDENT_AMBULATORY_CARE_PROVIDER_SITE_OTHER): Payer: Medicare Other | Admitting: Emergency Medicine

## 2018-01-04 ENCOUNTER — Encounter: Payer: Self-pay | Admitting: Emergency Medicine

## 2018-01-04 DIAGNOSIS — J449 Chronic obstructive pulmonary disease, unspecified: Secondary | ICD-10-CM | POA: Diagnosis not present

## 2018-01-04 DIAGNOSIS — M545 Low back pain: Secondary | ICD-10-CM | POA: Diagnosis not present

## 2018-01-04 DIAGNOSIS — M47896 Other spondylosis, lumbar region: Secondary | ICD-10-CM | POA: Diagnosis not present

## 2018-01-04 MED ORDER — TIOTROPIUM BROMIDE-OLODATEROL 2.5-2.5 MCG/ACT IN AERS: 2.0000 | INHALATION_SPRAY | Freq: Every day | RESPIRATORY_TRACT | 0 refills | Status: DC

## 2018-01-04 MED ORDER — LEVALBUTEROL HCL 0.63 MG/3ML IN NEBU: 0.6300 mg | INHALATION_SOLUTION | RESPIRATORY_TRACT | 6 refills | Status: DC | PRN

## 2018-01-04 MED ORDER — ZALEPLON 10 MG PO CAPS: 10.0000 mg | ORAL_CAPSULE | Freq: Every evening | ORAL | 0 refills | Status: DC | PRN

## 2018-01-04 NOTE — Assessment & Plan Note (Signed)
Episode of bronchitis in May, improved and at baseline. Good functional capacity and minimal daily sx. Plan continue same regimen. Supplement Stiolto w samples if possible. Reorder xopenex.

## 2018-01-04 NOTE — Progress Notes (Signed)
Subjective:    Patient ID: Jonathan [name], male    DOB: May 03, 1933, 84 y.o.   MRN: [id number] HPI 84 yo man, former smoker, hx of A Fib, COPD on nocturnal O2, GERD, also OSA not on CPAP for over 4 yrs. He has had PFT done in [place] Fall 2012 that supported COPD, walking oximetry that showed ? Hypoxemia, ONO with desats and started on . Began to have AE +/- PNA in 1/13 that never really got better. He was admitted to [facility] 2/4-07/21/11 for an AE-COPD associated with A fib + RVR.  Was rx steroids, abx, BD's. He is better but is limited to some degree, not back to prior baseline. He is low on energy. He is having trouble sleeping, difficulty falling asleep and staying asleep. He snores, no witnessed apnea. No real wheezing or coughing at this time.    ROV 03/26/16 -- patient has a hx COPD, OSA. His symptoms have been less well controlled since our last visit. He has had several flares including treatment for pneumonia and a recent hospitalization for the same - characterized by chills, cough. He was treated by Dr Roseanne Reno, was also treated at [facility] ED as well in April.  A CT scan of his chest was done on that hospitalization that showed some left apical inflammatory change with a nodular component, concerning for a primary nodule that will need to be followed. He is on Dulera bid, nasal steroid. Has albuterol but never uses / needs it. He has a daily cough. Very reliable w his CPAP. He tells me that he saw some melena after he was d/c from the hospital back on his eliquis.it has since resolved. Low energy.   ROV 07/13/16 -- this follow-up visit for COPD, obstructive sleep apnea with good compliance on CPAP. Wears reliably but needs his machine serviced or replaced. He also had a left apical inflammatory nodular change noted on a prior CT scan of the chest. This prompted a repeat CT scan that was done on 06/01/16 that I have reviewed. This showed that his left upper lobe nodular infiltrate had fully resolved.  He was hospitalized in September with CAP, staph cellulitis, sepsis. He feels that he is doing well now - back to baseline. He is able to be active, although he does have some days when he has some memory problems. No significant cough. He does occasionally hear some wheeze. He is able to climb stairs.   He is currently on Stiolto. Notes that he has some elevated occular pressures that are being followed. Also some urinary retention - doesn't happen every time, but sometimes unable to empty completely. He has xopenex for prn, uses very rarely. He does have chronic rhinitis, some hoarse voice. He uses nasonex, loratadine.  ROV 01/04/18 --84 year old man followed for COPD and obstructive sleep apnea on CPAP.  He has been seen twice in our office for exacerbations, bronchitis since I saw him last in January 2018.  Most recently he was treated in late May 2019 with azithromycin for possible bronchitis - he doesn't really remember what was going on with his sx.  He returns today reporting. Maintained on stiolto 2 puffs qd. He says that insurance coverage is worse this year. He doesn't know where his xopenex is. Minimal cough / wheeze. He is compliant with CPAP, 100% for > 4 hours, confirmed on his download info today. Recently had his O2 concentrator replaced, bleeds in 2L/min. Good clinical response to the CPAP - better sleep quality. He  does note some insomnia and difficulty initiating sleep. He uses sonata for sleep.      Objective:   Physical Exam Vitals:   01/04/18 0905  BP: 114/70  Pulse: (!) 59  SpO2: 98%  Weight: 203 lb 9.6 oz (92.4 kg)  Height: 5\' 6"  (1.676 m)    Gen: Pleasant, well-nourished, in no distress,  normal affect  ENT: No lesions,  mouth clear,  oropharynx clear, no postnasal drip  Neck: No JVD, no stridor  Lungs: No use of accessory muscles, clear without rales or rhonchi  Cardiovascular: RRR, heart sounds normal, no murmur or gallops, no peripheral edema  Musculoskeletal:  No deformities, no cyanosis or clubbing  Neuro: alert, non focal  Skin: Warm, no lesions or rashes     Assessment & Plan:  COPD (chronic obstructive pulmonary disease) (HCC) Episode of bronchitis in May, improved and at baseline. Good functional capacity and minimal daily sx. Plan continue same regimen. Supplement Stiolto w samples if possible. Reorder xopenex.   Sleep apnea, obstructive Good compliance and clinical benefit with his CPAP.  He is having difficulty with sleep initiation.  He uses Sonata each evening but this is not been refilled yet by his PCP.  He is working on this now.  I can give him a 37-month supply until he gets that sorted out with his primary doctor.  continue CPAP + 2L/min   [name], MD, PhD 01/04/2018, 9:21 AM [facility] [phone] or if no answer [phone]

## 2018-01-04 NOTE — Patient Instructions (Signed)
Please continue your CPAP every night + 2L/min oxygen as you have been using We will refill your Sonata for 3 months until you can clarify your prescription with your PCP.  Continue your Stiolto as you are taking it.  We will prescribe xopenex. Use 2 puffs up to every 4 hours if needed for shortness of breath.  Follow with Dr Lamonte Sakai in 12 months or sooner if you have any problems

## 2018-01-04 NOTE — Assessment & Plan Note (Signed)
Good compliance and clinical benefit with his CPAP.  He is having difficulty with sleep initiation.  He uses Sonata each evening but this is not been refilled yet by his PCP.  He is working on this now.  I can give him a 6-month supply until he gets that sorted out with his primary doctor.  continue CPAP + 2L/min

## 2018-01-06 DIAGNOSIS — M545 Low back pain: Secondary | ICD-10-CM | POA: Diagnosis not present

## 2018-01-06 DIAGNOSIS — M47896 Other spondylosis, lumbar region: Secondary | ICD-10-CM | POA: Diagnosis not present

## 2018-01-11 DIAGNOSIS — M545 Low back pain: Secondary | ICD-10-CM | POA: Diagnosis not present

## 2018-01-11 DIAGNOSIS — M47896 Other spondylosis, lumbar region: Secondary | ICD-10-CM | POA: Diagnosis not present

## 2018-01-13 ENCOUNTER — Encounter: Payer: Self-pay | Admitting: Emergency Medicine

## 2018-01-13 DIAGNOSIS — M47896 Other spondylosis, lumbar region: Secondary | ICD-10-CM | POA: Diagnosis not present

## 2018-01-13 DIAGNOSIS — M545 Low back pain: Secondary | ICD-10-CM | POA: Diagnosis not present

## 2018-01-14 NOTE — Telephone Encounter (Signed)
Please advise on pt email thanks  ----- Message from [place], Generic sent at 01/13/2018 11:20 PM EDT -----    Thank you for the [facility] script. However it seems to have lost its effectiveness as it has sometimes in the past. Previously when this happened I would change to Ambien for a while and then change back to [place]. I would greatly appreciate if you would write me a script for Ambien, since I no longer have any. I could come by your office tomorrow morning to pick it up. Insomnia has been a problem since my teenage years

## 2018-01-18 DIAGNOSIS — M47896 Other spondylosis, lumbar region: Secondary | ICD-10-CM | POA: Diagnosis not present

## 2018-01-18 DIAGNOSIS — M545 Low back pain: Secondary | ICD-10-CM | POA: Diagnosis not present

## 2018-01-24 ENCOUNTER — Other Ambulatory Visit: Payer: Self-pay | Admitting: Internal Medicine

## 2018-01-25 ENCOUNTER — Ambulatory Visit (INDEPENDENT_AMBULATORY_CARE_PROVIDER_SITE_OTHER): Payer: Medicare Other | Admitting: Cardiology

## 2018-01-25 ENCOUNTER — Encounter: Payer: Self-pay | Admitting: Cardiology

## 2018-01-25 VITALS — BP 138/72 | HR 57 | Ht 66.0 in | Wt 202.0 lb

## 2018-01-25 DIAGNOSIS — G894 Chronic pain syndrome: Secondary | ICD-10-CM | POA: Diagnosis not present

## 2018-01-25 DIAGNOSIS — J438 Other emphysema: Secondary | ICD-10-CM

## 2018-01-25 DIAGNOSIS — M545 Low back pain: Secondary | ICD-10-CM | POA: Diagnosis not present

## 2018-01-25 DIAGNOSIS — Z7901 Long term (current) use of anticoagulants: Secondary | ICD-10-CM

## 2018-01-25 DIAGNOSIS — I480 Paroxysmal atrial fibrillation: Secondary | ICD-10-CM

## 2018-01-25 DIAGNOSIS — M47896 Other spondylosis, lumbar region: Secondary | ICD-10-CM | POA: Diagnosis not present

## 2018-01-25 MED ORDER — ASPIRIN EC 81 MG PO TBEC: 81.0000 mg | DELAYED_RELEASE_TABLET | Freq: Every day | ORAL | 3 refills | Status: DC

## 2018-01-25 MED ORDER — APIXABAN 5 MG PO TABS: 5.0000 mg | ORAL_TABLET | Freq: Two times a day (BID) | ORAL | 1 refills | Status: DC

## 2018-01-25 NOTE — Progress Notes (Signed)
Subjective:    Patient ID: Jonathan [name], male    DOB: 1933/05/16, 84 y.o.   MRN: [id number]  HPI The patient is here for an acute visit.  Skin concerns.  He has a couple of spots on his skin that he noticed a year or two ago.  He itches them at times.  He does not think they have changed.  They are raised.  He tried to schedule with a dermatologist, but he can not see them until January.  He has two spots on his left arm and one on his left posterior left leg.    One month ago he was walking against the water.  He felt cognitively funnny.  He thought he was dehydrated.  He went home and drank a lot - no change.  He called cardio and he was put on a monitor for two weeks.  There were no abnormalities. He was not taking the eliquis for a while due to cost, but is back on it.  There was some concern whether he may have had a TIA.  He feels unplugged at times.  His sleep pattern makes a difference in this.  His brain feels exhausted at times.  He does feel depressed, but denies suicidal thoughts.  He is taking his Cymbalta daily as prescribed.  Insomnia: He takes Sonata at night, but this does not always work.  He was wondering if he could go back to Ambien, which she has been on the past and it did not work, but after a while stop working.  He would ideally like to switch between Ambien and Sonata as needed so that at least 1 of them would be effective.  Medications and allergies reviewed with patient and updated if appropriate.  Patient Active Problem List   Diagnosis Date Noted  . Chronic anticoagulation 12/21/2017  . Polypharmacy 12/21/2017  . Nausea 08/10/2017  . Mid back pain on left side 08/10/2017  . Panic attack 08/09/2017  . COPD with acute exacerbation ([place]) 07/23/2017  . Wears glasses   . Spinal compression fracture ([place])   . Sleep apnea   . Rotator cuff tear, left   . Osteoarthritis   . On home oxygen therapy   . Hypoxia   . Fibromyalgia   . Fatty tumor   . Cataract     . BPH (benign prostatic hyperplasia)   . PAF (paroxysmal atrial fibrillation) ([place])   . Insomnia 03/10/2017  . Chronic pain, legs and back 03/10/2017  . Hyperlipidemia 03/10/2017  . Unilateral occipital headache 12/22/2016  . Obesity (BMI 30.0-34.9) 12/08/2016  . Depression 09/07/2016  . Osteoporosis 09/07/2016  . Hypothyroidism 03/08/2016  . GERD (gastroesophageal reflux disease) 03/08/2016  . Gout 03/08/2016  . Glaucoma 03/08/2016  . Type 2 diabetes mellitus ([place]) 03/08/2016  . Hereditary and idiopathic peripheral neuropathy 08/28/2015  . Memory disorder 08/28/2015  . Transient alteration of awareness 08/28/2015  . Fatigue 01/08/2015  . Bruit 08/15/2013  . Essential hypertension 12/04/2011  . Allergic rhinitis, seasonal 12/04/2011  . Sleep apnea, obstructive 08/25/2011  . CKD (chronic kidney disease) 07/20/2011  . COPD (chronic obstructive pulmonary disease) ([place]) 07/18/2011    Current Outpatient Medications on File Prior to Visit  Medication Sig Dispense Refill  . acetaminophen (TYLENOL) 500 MG tablet Take 2 tablets (1,000 mg total) by mouth every 8 (eight) hours as needed. 360 tablet 1  . allopurinol (ZYLOPRIM) 300 MG tablet TAKE 1 TABLET BY MOUTH  EVERY EVENING 90 tablet 1  .  apixaban (ELIQUIS) 5 MG TABS tablet Take 1 tablet (5 mg total) by mouth 2 (two) times daily. 180 tablet 1  . atorvastatin (LIPITOR) 20 MG tablet TAKE 1 TABLET BY MOUTH  DAILY 90 tablet 3  . Biotin 5000 MCG TABS Take 5,000 mcg by mouth daily.    . blood glucose meter kit and supplies KIT Dispense based on patient and insurance preference. Use up to four times daily as directed. (FOR E11.9). 1 each 0  . brimonidine (ALPHAGAN) 0.15 % ophthalmic solution Place 1 drop into the left eye 2 (two) times daily.     . Calcium Lactate 750 MG TABS Take 1 tablet by mouth daily.     . calcium-vitamin D (OSCAL WITH D) 500-200 MG-UNIT tablet Take 1 tablet by mouth daily with breakfast.    . colchicine (COLCRYS) 0.6 MG  tablet Take 0.6 mg by mouth daily as needed (flareups).     . diltiazem (CARDIZEM CD) 300 MG 24 hr capsule Take 1 capsule (300 mg total) daily by mouth. 90 capsule 3  . doxazosin (CARDURA) 8 MG tablet TAKE 1 TABLET BY MOUTH  DAILY 90 tablet 1  . DULoxetine (CYMBALTA) 30 MG capsule TAKE 2 CAPSULES BY MOUTH IN THE MORNING AND 1 CAPSULE  BY MOUTH IN THE EVENING 270 capsule 1  . hydrochlorothiazide (HYDRODIURIL) 25 MG tablet TAKE 1 TABLET BY MOUTH  DAILY 90 tablet 3  . latanoprost (XALATAN) 0.005 % ophthalmic solution Place 1 drop into both eyes at bedtime. 7.5 mL 3  . levalbuterol (XOPENEX HFA) 45 MCG/ACT inhaler Inhale 1-2 puffs into the lungs every 4 (four) hours as needed for wheezing. 1 Inhaler 12  . levalbuterol (XOPENEX) 0.63 MG/3ML nebulizer solution Take 3 mLs (0.63 mg total) by nebulization every 4 (four) hours as needed for wheezing or shortness of breath. 3 mL 6  . levothyroxine (SYNTHROID, LEVOTHROID) 100 MCG tablet TAKE 1 TABLET BY MOUTH  DAILY 90 tablet 1  . magnesium oxide (MAG-OX) 400 MG tablet Take 400 mg by mouth daily.    . metFORMIN (GLUCOPHAGE) 500 MG tablet Take 1 tablet (500 mg total) by mouth 2 (two) times daily with a meal. 180 tablet 3  . Multiple Vitamin (MULTIVITAMIN) tablet Take 1 tablet by mouth daily.    . mupirocin cream (BACTROBAN) 2 % Apply 1 application topically 2 (two) times daily. 15 g 0  . nitroGLYCERIN (NITROSTAT) 0.4 MG SL tablet Place 1 tablet (0.4 mg total) under the tongue every 5 (five) minutes as needed for chest pain. 90 tablet 3  . omeprazole (PRILOSEC) 20 MG capsule Take 1 capsule (20 mg total) by mouth daily. 90 capsule 1  . oxyCODONE (ROXICODONE) 15 MG immediate release tablet Take 15 mg by mouth every 4 (four) hours as needed for pain.    . potassium gluconate (EQL POTASSIUM GLUCONATE) 595 (99 K) MG TABS tablet Take 595 mg by mouth daily.    . tadalafil (CIALIS) 20 MG tablet Take 1 tablet (20 mg total) by mouth daily as needed. For erectile  dysfunction 10 tablet 1  . Tiotropium Bromide-Olodaterol (STIOLTO RESPIMAT) 2.5-2.5 MCG/ACT AERS Inhale 2 puffs into the lungs daily. 2 Inhaler 0  . tiZANidine (ZANAFLEX) 4 MG capsule Take 1 capsule (4 mg total) by mouth at bedtime. 90 capsule 1  . vitamin C (ASCORBIC ACID) 500 MG tablet Take 1,000 mg by mouth daily.     Marland Kitchen VITAMIN D, CHOLECALCIFEROL, PO Take 50 mcg by mouth daily.    . zaleplon (  SONATA) 10 MG capsule Take 1 capsule (10 mg total) by mouth at bedtime as needed for sleep. 90 capsule 0   No current facility-administered medications on file prior to visit.     Past Medical History:  Diagnosis Date  . Atrial fibrillation (HCC)    a. paroxysmal, on Eliquis for anticoagulation  . BPH (benign prostatic hyperplasia)   . Bronchitis   . Cataract   . COPD (chronic obstructive pulmonary disease) (HCC)    2 liters O2 HS  . Depression   . Fatty tumor    waste and back  . Fibromyalgia   . GERD (gastroesophageal reflux disease)   . Glaucoma    bilateral eyes  . Gout   . Hereditary and idiopathic peripheral neuropathy 08/28/2015  . Hypertension   . Hypothyroidism   . Hypoxia   . Insomnia   . Memory disorder 08/28/2015  . On home oxygen therapy    at night with cpap  . Osteoarthritis   . Osteoporosis   . Peripheral neuropathy   . Rotator cuff tear, left   . Sleep apnea    uses cpap-add oxygen at night  . Spinal compression fracture (HCC) seventh vertebre  . Transient alteration of awareness 08/28/2015  . Type 2 diabetes mellitus ([place]) 03/08/2016  . Wears glasses     Past Surgical History:  Procedure Laterality Date  . APPENDECTOMY  age 25  . CARPAL TUNNEL RELEASE Right    early 2000s  . CATARACT EXTRACTION     bilateral  . CHOLECYSTECTOMY  age 71  . EYE SURGERY    . FOOT ARTHRODESIS Right 02/02/2013   Procedure: RIGHT HALLUX METATARSAL PHALANGEAL JOINT ARTHRODESIS ;  Surgeon: Wylene Simmer, MD;  Location: [facility];  Service: Orthopedics;  Laterality:  Right;  . INGUINAL HERNIA REPAIR  age 4   rt side  . NASAL CONCHA BULLOSA RESECTION  age 4  . PROSTATE SURGERY    . SHOULDER ARTHROSCOPY W/ ROTATOR CUFF REPAIR Right    early 2000s  . tonsil    . VASECTOMY  age 76    Social History   Socioeconomic History  . Marital status: Married    Spouse name: Not on file  . Number of children: Not on file  . Years of education: Not on file  . Highest education level: Not on file  Occupational History  . Occupation: RETIRED    Employer: RETIRED  Social Needs  . Financial resource strain: Not on file  . Food insecurity:    Worry: Not on file    Inability: Not on file  . Transportation needs:    Medical: Not on file    Non-medical: Not on file  Tobacco Use  . Smoking status: Former Smoker    Packs/day: 2.00    Years: 10.00    Pack years: 20.00    Types: Cigarettes    Last attempt to quit: 07/18/1975    Years since quitting: 42.5  . Smokeless tobacco: Never Used  Substance and Sexual Activity  . Alcohol use: Yes    Comment: 1-2 drinks/day  . Drug use: No  . Sexual activity: Not on file  Lifestyle  . Physical activity:    Days per week: Not on file    Minutes per session: Not on file  . Stress: Not on file  Relationships  . Social connections:    Talks on phone: Not on file    Gets together: Not on file    Attends  religious service: Not on file    Active member of club or organization: Not on file    Attends meetings of clubs or organizations: Not on file    Relationship status: Not on file  Other Topics Concern  . Not on file  Social History Narrative  . Not on file    Family History  Problem Relation Age of Onset  . Coronary artery disease Brother   . Heart disease Father   . Lung cancer Father   . Kidney cancer Father   . Prostate cancer Father   . Arthritis Mother   . Lung cancer Mother     Review of Systems  Constitutional: Positive for fatigue. Negative for fever.  Skin: Positive for color change.  Negative for wound.  Neurological: Negative for light-headedness and headaches.  Hematological: Bruises/bleeds easily.  Psychiatric/Behavioral: Positive for dysphoric mood and sleep disturbance.       Objective:   Vitals:   01/26/18 1445  BP: 110/64  Pulse: 67  Resp: 16  Temp: 98.5 F (36.9 C)  SpO2: 95%   BP Readings from Last 3 Encounters:  01/26/18 110/64  01/25/18 138/72  01/04/18 114/70   Wt Readings from Last 3 Encounters:  01/26/18 202 lb (91.6 kg)  01/25/18 202 lb (91.6 kg)  01/04/18 203 lb 9.6 oz (92.4 kg)   Body mass index is 32.6 kg/m.   Physical Exam  Constitutional: He appears well-developed and well-nourished. No distress.  HENT:  Head: Normocephalic and atraumatic.  Skin: Skin is warm and dry. He is not diaphoretic.  Two lesions on left forearm that are raised, dry and one lesion on left posterior with circular lesion - skin colored           Assessment & Plan:    See Problem List for Assessment and Plan of chronic medical problems.

## 2018-01-25 NOTE — Progress Notes (Signed)
01/25/2018 Jonathan [name]   07-25-1932  [id number]  Primary Physician Quay Burow, Claudina Lick, MD Primary Cardiologist: Dr Stanford Breed  HPI:  84 y/o male followed by Dr Stanford Breed with a history of PAF, seen today in f/u after an event monitor was placed. The pt has PAF but we believe he has been holding NSR going back to 2013. He had recently stopped Eliquis because he couldn't afford it (he is on 30 medications).  He had an episode at the [facility] he became dizzy and his short term memory was affected doing water aerobics. His symptoms lasted for 24 hours. He has a history of some memory problems and also a history of "transient alteration of awareness". He was seen in the office 12/21/17. He was in NSR. An event monitor was arranged. This showed NSR with PACs, no arrhythmia. He denies any further symptoms. He has resumed Eliquis as I suggested.    Current Outpatient Medications  Medication Sig Dispense Refill  . acetaminophen (TYLENOL) 500 MG tablet Take 2 tablets (1,000 mg total) by mouth every 8 (eight) hours as needed. 360 tablet 1  . allopurinol (ZYLOPRIM) 300 MG tablet TAKE 1 TABLET BY MOUTH  EVERY EVENING 90 tablet 1  . atorvastatin (LIPITOR) 20 MG tablet TAKE 1 TABLET BY MOUTH  DAILY 90 tablet 3  . Biotin 5000 MCG TABS Take 5,000 mcg by mouth daily.    . blood glucose meter kit and supplies KIT Dispense based on patient and insurance preference. Use up to four times daily as directed. (FOR E11.9). 1 each 0  . brimonidine (ALPHAGAN) 0.15 % ophthalmic solution Place 1 drop into the left eye 2 (two) times daily.     . Calcium Lactate 750 MG TABS Take 1 tablet by mouth daily.     . calcium-vitamin D (OSCAL WITH D) 500-200 MG-UNIT tablet Take 1 tablet by mouth daily with breakfast.    . colchicine (COLCRYS) 0.6 MG tablet Take 0.6 mg by mouth daily as needed (flareups).     . diltiazem (CARDIZEM CD) 300 MG 24 hr capsule Take 1 capsule (300 mg total) daily by mouth. 90 capsule 3  . doxazosin (CARDURA) 8 MG  tablet TAKE 1 TABLET BY MOUTH  DAILY 90 tablet 1  . DULoxetine (CYMBALTA) 30 MG capsule TAKE 2 CAPSULES BY MOUTH IN THE MORNING AND 1 CAPSULE  BY MOUTH IN THE EVENING 270 capsule 1  . hydrochlorothiazide (HYDRODIURIL) 25 MG tablet TAKE 1 TABLET BY MOUTH  DAILY 90 tablet 3  . latanoprost (XALATAN) 0.005 % ophthalmic solution Place 1 drop into both eyes at bedtime. 7.5 mL 3  . levalbuterol (XOPENEX HFA) 45 MCG/ACT inhaler Inhale 1-2 puffs into the lungs every 4 (four) hours as needed for wheezing. 1 Inhaler 12  . levalbuterol (XOPENEX) 0.63 MG/3ML nebulizer solution Take 3 mLs (0.63 mg total) by nebulization every 4 (four) hours as needed for wheezing or shortness of breath. 3 mL 6  . levothyroxine (SYNTHROID, LEVOTHROID) 100 MCG tablet TAKE 1 TABLET BY MOUTH  DAILY 90 tablet 1  . magnesium oxide (MAG-OX) 400 MG tablet Take 400 mg by mouth daily.    . metFORMIN (GLUCOPHAGE) 500 MG tablet Take 1 tablet (500 mg total) by mouth 2 (two) times daily with a meal. 180 tablet 3  . Multiple Vitamin (MULTIVITAMIN) tablet Take 1 tablet by mouth daily.    . mupirocin cream (BACTROBAN) 2 % Apply 1 application topically 2 (two) times daily. 15 g 0  . nitroGLYCERIN (NITROSTAT)  0.4 MG SL tablet Place 1 tablet (0.4 mg total) under the tongue every 5 (five) minutes as needed for chest pain. 90 tablet 3  . omeprazole (PRILOSEC) 20 MG capsule Take 1 capsule (20 mg total) by mouth daily. 90 capsule 1  . oxyCODONE (ROXICODONE) 15 MG immediate release tablet Take 15 mg by mouth every 4 (four) hours as needed for pain.    . potassium gluconate (EQL POTASSIUM GLUCONATE) 595 (99 K) MG TABS tablet Take 595 mg by mouth daily.    . tadalafil (CIALIS) 20 MG tablet Take 1 tablet (20 mg total) by mouth daily as needed. For erectile dysfunction 10 tablet 1  . Tiotropium Bromide-Olodaterol (STIOLTO RESPIMAT) 2.5-2.5 MCG/ACT AERS Inhale 2 puffs into the lungs daily. 2 Inhaler 0  . tiZANidine (ZANAFLEX) 4 MG capsule Take 1 capsule (4 mg  total) by mouth at bedtime. 90 capsule 1  . vitamin C (ASCORBIC ACID) 500 MG tablet Take 1,000 mg by mouth daily.     Marland Kitchen VITAMIN D, CHOLECALCIFEROL, PO Take 50 mcg by mouth daily.    . zaleplon (SONATA) 10 MG capsule Take 1 capsule (10 mg total) by mouth at bedtime as needed for sleep. 90 capsule 0   No current facility-administered medications for this visit.     Allergies  Allergen Reactions  . Other     Other reaction(s): Respiratory distress    Past Medical History:  Diagnosis Date  . Atrial fibrillation (HCC)    a. paroxysmal, on Eliquis for anticoagulation  . BPH (benign prostatic hyperplasia)   . Bronchitis   . Cataract   . COPD (chronic obstructive pulmonary disease) (HCC)    2 liters O2 HS  . Depression   . Fatty tumor    waste and back  . Fibromyalgia   . GERD (gastroesophageal reflux disease)   . Glaucoma    bilateral eyes  . Gout   . Hereditary and idiopathic peripheral neuropathy 08/28/2015  . Hypertension   . Hypothyroidism   . Hypoxia   . Insomnia   . Memory disorder 08/28/2015  . On home oxygen therapy    at night with cpap  . Osteoarthritis   . Osteoporosis   . Peripheral neuropathy   . Rotator cuff tear, left   . Sleep apnea    uses cpap-add oxygen at night  . Spinal compression fracture (HCC) seventh vertebre  . Transient alteration of awareness 08/28/2015  . Type 2 diabetes mellitus ([place]) 03/08/2016  . Wears glasses     Social History   Socioeconomic History  . Marital status: Married    Spouse name: Not on file  . Number of children: Not on file  . Years of education: Not on file  . Highest education level: Not on file  Occupational History  . Occupation: RETIRED    Employer: RETIRED  Social Needs  . Financial resource strain: Not on file  . Food insecurity:    Worry: Not on file    Inability: Not on file  . Transportation needs:    Medical: Not on file    Non-medical: Not on file  Tobacco Use  . Smoking status: Former Smoker     Packs/day: 2.00    Years: 10.00    Pack years: 20.00    Types: Cigarettes    Last attempt to quit: 07/18/1975    Years since quitting: 42.5  . Smokeless tobacco: Never Used  Substance and Sexual Activity  . Alcohol use: Yes    Comment:  1-2 drinks/day  . Drug use: No  . Sexual activity: Not on file  Lifestyle  . Physical activity:    Days per week: Not on file    Minutes per session: Not on file  . Stress: Not on file  Relationships  . Social connections:    Talks on phone: Not on file    Gets together: Not on file    Attends religious service: Not on file    Active member of club or organization: Not on file    Attends meetings of clubs or organizations: Not on file    Relationship status: Not on file  . Intimate partner violence:    Fear of current or ex partner: Not on file    Emotionally abused: Not on file    Physically abused: Not on file    Forced sexual activity: Not on file  Other Topics Concern  . Not on file  Social History Narrative  . Not on file     Family History  Problem Relation Age of Onset  . Coronary artery disease Brother   . Heart disease Father   . Lung cancer Father   . Kidney cancer Father   . Prostate cancer Father   . Arthritis Mother   . Lung cancer Mother      Review of Systems: General: negative for chills, fever, night sweats or weight changes.  Cardiovascular: negative for chest pain, dyspnea on exertion, edema, orthopnea, palpitations, paroxysmal nocturnal dyspnea or shortness of breath Dermatological: negative for rash Respiratory: negative for cough or wheezing Urologic: negative for hematuria Abdominal: negative for nausea, vomiting, diarrhea, bright red blood per rectum, melena, or hematemesis Neurologic: negative for visual changes, syncope, or dizziness Chronic back pain- goes to pain clinic Chronic knee pain from DJD All other systems reviewed and are otherwise negative except as noted above.    Blood pressure 138/72,  pulse (!) 57, height _0  (1.676 m), weight 202 lb (91.6 kg).  General appearance: alert, cooperative, no distress and moderately obese Neck: no carotid bruit and no JVD Lungs: clear to auscultation bilaterally Heart: regular rate and rhythm Extremities: no edema Skin: Skin color, texture, turgor normal. No rashes or lesions Neurologic: Grossly normal   ASSESSMENT AND PLAN:   PAF (paroxysmal atrial fibrillation) (HCC) PAF (holding NSR)    Chronic anticoagulation CHADS VASC=4, on Eliquis  Essential hypertension Controlled  Sleep apnea uses cpap-add oxygen at night  Type 2 diabetes mellitus (HCC) Followed by PCP  COPD  Followed by Dr Halford Chessman  Chronic back pain Spinal stenosis-followed by Dr K[facility]  Polypharmacy Pt is on 30 medications  PLAN  He is back on Eliquis. No obvious reason for the episode he had based on his monitor. If he has another episode I would consider an MRI- neurology evaluation. F/U Dr Stanford Breed in 6 months.   Kerin Ransom PA-C 01/25/2018 10:52 AM

## 2018-01-25 NOTE — Patient Instructions (Addendum)
Medication Instructions:  Your physician recommends that you continue on your current medications as directed. Please refer to the Current Medication list given to you today.  Labwork: NONE   Testing/Procedures: NONE   Follow-Up: Your physician wants you to follow-up in: [place]. You will receive a reminder letter in the mail two months in advance. If you don't receive a letter, please call our office to schedule the follow-up appointment.  Any Other Special Instructions Will Be Listed Below (If Applicable). If you need a refill on your cardiac medications before your next appointment, please call your pharmacy.

## 2018-01-26 ENCOUNTER — Encounter: Payer: Self-pay | Admitting: Internal Medicine

## 2018-01-26 ENCOUNTER — Ambulatory Visit (INDEPENDENT_AMBULATORY_CARE_PROVIDER_SITE_OTHER): Payer: Medicare Other | Admitting: Internal Medicine

## 2018-01-26 ENCOUNTER — Telehealth: Payer: Self-pay | Admitting: Internal Medicine

## 2018-01-26 VITALS — BP 110/64 | HR 67 | Temp 98.5°F | Resp 16 | Wt 202.0 lb

## 2018-01-26 DIAGNOSIS — L989 Disorder of the skin and subcutaneous tissue, unspecified: Secondary | ICD-10-CM

## 2018-01-26 DIAGNOSIS — F32A Depression, unspecified: Secondary | ICD-10-CM

## 2018-01-26 DIAGNOSIS — M47896 Other spondylosis, lumbar region: Secondary | ICD-10-CM | POA: Diagnosis not present

## 2018-01-26 DIAGNOSIS — G4700 Insomnia, unspecified: Secondary | ICD-10-CM | POA: Diagnosis not present

## 2018-01-26 DIAGNOSIS — M545 Low back pain: Secondary | ICD-10-CM | POA: Diagnosis not present

## 2018-01-26 DIAGNOSIS — F329 Major depressive disorder, single episode, unspecified: Secondary | ICD-10-CM | POA: Diagnosis not present

## 2018-01-26 MED ORDER — ZOLPIDEM TARTRATE 10 MG PO TABS: 10.0000 mg | ORAL_TABLET | Freq: Every evening | ORAL | 0 refills | Status: DC | PRN

## 2018-01-26 NOTE — Telephone Encounter (Signed)
Okay to have Ambien filled.  He will not take both the Ambien and Sonata at the same time.  He may switch back and forth between the 2 because either one is only effective for so long.

## 2018-01-26 NOTE — Telephone Encounter (Signed)
Pt was given RX by Dr Lamonte Sakai on 01/04/18 for Sonata #90 that was filled on 01/13/18. Please advise if you are okay with pt having Ambien RX filled as well.

## 2018-01-26 NOTE — Assessment & Plan Note (Signed)
Not ideally controlled At this point he is not interested in making any changes in his medication Continue current dose of Cymbalta

## 2018-01-26 NOTE — Assessment & Plan Note (Signed)
Read Drivers is not always effective Will alternate sonata and ambien  Will refill Lorrin Mais - has taken it in the past Stressed he can not take both - it has to be one or the other

## 2018-01-26 NOTE — Telephone Encounter (Signed)
Copied from [place] [id number]. Topic: Quick Communication - See Telephone Encounter >> Jan 26, 2018  3:27 PM Conception Chancy, NT wrote: CRM for notification. See Telephone encounter for: 01/26/18.  [facility] is calling and is needing to speak with Dr. Quay Burow nurse about prescription zolpidem (AMBIEN) 10 MG tablet that was sent over today.   CB#  [phone]

## 2018-01-26 NOTE — Assessment & Plan Note (Signed)
Possible precancerous lesions on left forearm Benign-appearing lesion on left posterior calf Will refer to dermatology-advised he does need to have this further evaluated, but it is not emergency ~takes a few months to get and that is okay

## 2018-01-26 NOTE — Patient Instructions (Addendum)
  Medications reviewed and updated.  Ambien was sent to your pharmacy.  Take this or the sonata not both.     A referral was ordered for dermatology

## 2018-01-27 ENCOUNTER — Encounter: Payer: Self-pay | Admitting: Internal Medicine

## 2018-01-27 NOTE — Telephone Encounter (Signed)
PA completed. Key [id number]. Awaiting response.

## 2018-01-27 NOTE — Telephone Encounter (Signed)
Spoke with [facility] to inform. States Ambien is requiring PA. Will fax over PA form.

## 2018-01-28 MED ORDER — ZOLPIDEM TARTRATE 10 MG PO TABS: 10.0000 mg | ORAL_TABLET | Freq: Every evening | ORAL | 0 refills | Status: DC | PRN

## 2018-01-28 NOTE — Telephone Encounter (Signed)
OK with me to change to Ambien for 3 months, then try change back to the sonata

## 2018-01-28 NOTE — Telephone Encounter (Signed)
PA has been approved. RX sent to [facility] at Pts request.

## 2018-02-01 DIAGNOSIS — M545 Low back pain: Secondary | ICD-10-CM | POA: Diagnosis not present

## 2018-02-01 DIAGNOSIS — M47896 Other spondylosis, lumbar region: Secondary | ICD-10-CM | POA: Diagnosis not present

## 2018-02-03 DIAGNOSIS — M545 Low back pain: Secondary | ICD-10-CM | POA: Diagnosis not present

## 2018-02-03 DIAGNOSIS — M47896 Other spondylosis, lumbar region: Secondary | ICD-10-CM | POA: Diagnosis not present

## 2018-02-07 DIAGNOSIS — M47817 Spondylosis without myelopathy or radiculopathy, lumbosacral region: Secondary | ICD-10-CM | POA: Diagnosis not present

## 2018-02-09 ENCOUNTER — Encounter: Payer: Self-pay | Admitting: Internal Medicine

## 2018-02-10 DIAGNOSIS — M1712 Unilateral primary osteoarthritis, left knee: Secondary | ICD-10-CM | POA: Diagnosis not present

## 2018-02-13 NOTE — Progress Notes (Signed)
Subjective:    Patient ID: Jonathan [name], male    DOB: Aug 17, 1932, 84 y.o.   MRN: [id number]  HPI The patient is here for follow up.  Diabetes: He is taking his medication daily as prescribed. He is compliant with a diabetic diet. He is exercising  -  [facility]  He also does some exercises at home.  He checks his feet daily and denies foot lesions. He is up-to-date with an ophthalmology examination.   Insomnia:  He is now taking [place].  He has stopped taking the Son[place].  He denies side effects from the Ambien.  It is working now, but eventually he will probably stop working and then he would like to switch back to [employer].     Hypothyroidism:  He is taking his medication daily.  He denies any recent changes in energy or weight that are unexplained.   Hyperlipidemia: He is taking his medication daily. He is compliant with a low fat/cholesterol diet. He is exercising some.    OP:  His bone density in May was normal.  He was on fosamax for about one year, which was prescribed by his previous PCP.Marland Kitchen   Gout:  He takes allopurinol daily.  He denies gout symptoms since he was here last.   Memory concerns:  He feels his memory has gotten worse in the past two months.  He states he has been taking the pain medication more regularly at night because he is not able to sleep due to chronic arthritis pain.  He denies any other obvious changes in medication or lifestyle that may have affected his memory.  He would like to be retested.  Arthritis in right hand:  He has pain in his right thumb and index finger and also on the left side, but it is not as bad.  He has been diagnosed with arthritis in the hands.  He has seen orthopedics and they did discuss possible injections with him, but they told him it would be very painful.  He thinks he is at the point that he may need to consider the injections.  Part of the reason he takes the oxycodone is for the arthritis pain in his  hands.  Chronic back pain:  He is getting radiofrequency ablation.   Knee arthritis:  He is getting injections.    Hair loss, head itching:  He only has hair loss on his head.  He denies localized hair loss - it is diffuse.  The scalp itches all the time. He has an appointment with derm in about one week.  He denies any flaky or dry skin on the scalp and denies known redness.  He has frequent BM's about 4-5 times a day.  He typically has a lot of gas and has had a lot of gas this whole life.   He has semi-formed stool most of the time.  He does take probiotics daily.  Medications and allergies reviewed with patient and updated if appropriate.  Patient Active Problem List   Diagnosis Date Noted  . Skin abnormalities 01/26/2018  . Chronic anticoagulation 12/21/2017  . Polypharmacy 12/21/2017  . Nausea 08/10/2017  . Mid back pain on left side 08/10/2017  . Panic attack 08/09/2017  . COPD with acute exacerbation ([place]) 07/23/2017  . Spinal compression fracture ([place])   . Sleep apnea   . Rotator cuff tear, left   . Osteoarthritis   . On home oxygen therapy   .  Hypoxia   . Fibromyalgia   . Fatty tumor   . Cataract   . BPH (benign prostatic hyperplasia)   . PAF (paroxysmal atrial fibrillation) ([place])   . Insomnia 03/10/2017  . Chronic pain, legs and back 03/10/2017  . Hyperlipidemia 03/10/2017  . Obesity (BMI 30.0-34.9) 12/08/2016  . Depression 09/07/2016  . Osteoporosis 09/07/2016  . Hypothyroidism 03/08/2016  . GERD (gastroesophageal reflux disease) 03/08/2016  . Gout 03/08/2016  . Glaucoma 03/08/2016  . Type 2 diabetes mellitus ([place]) 03/08/2016  . Hereditary and idiopathic peripheral neuropathy 08/28/2015  . Memory disorder 08/28/2015  . Fatigue 01/08/2015  . Bruit 08/15/2013  . Essential hypertension 12/04/2011  . Allergic rhinitis, seasonal 12/04/2011  . Sleep apnea, obstructive 08/25/2011  . COPD (chronic obstructive pulmonary disease) ([place]) 07/18/2011    Current  Outpatient Medications on File Prior to Visit  Medication Sig Dispense Refill  . acetaminophen (TYLENOL) 500 MG tablet Take 2 tablets (1,000 mg total) by mouth every 8 (eight) hours as needed. 360 tablet 1  . allopurinol (ZYLOPRIM) 300 MG tablet TAKE 1 TABLET BY MOUTH  EVERY EVENING 90 tablet 1  . apixaban (ELIQUIS) 5 MG TABS tablet Take 1 tablet (5 mg total) by mouth 2 (two) times daily. 180 tablet 1  . atorvastatin (LIPITOR) 20 MG tablet TAKE 1 TABLET BY MOUTH  DAILY 90 tablet 3  . Biotin 5000 MCG TABS Take 5,000 mcg by mouth daily.    . blood glucose meter kit and supplies KIT Dispense based on patient and insurance preference. Use up to four times daily as directed. (FOR E11.9). 1 each 0  . brimonidine (ALPHAGAN) 0.15 % ophthalmic solution Place 1 drop into the left eye 2 (two) times daily.     . calcium-vitamin D (OSCAL WITH D) 500-200 MG-UNIT tablet Take 1 tablet by mouth daily with breakfast.    . colchicine (COLCRYS) 0.6 MG tablet Take 0.6 mg by mouth daily as needed (flareups).     . diltiazem (CARDIZEM CD) 300 MG 24 hr capsule Take 1 capsule (300 mg total) daily by mouth. 90 capsule 3  . doxazosin (CARDURA) 8 MG tablet TAKE 1 TABLET BY MOUTH  DAILY 90 tablet 1  . DULoxetine (CYMBALTA) 30 MG capsule TAKE 2 CAPSULES BY MOUTH IN THE MORNING AND 1 CAPSULE  BY MOUTH IN THE EVENING 270 capsule 1  . hydrochlorothiazide (HYDRODIURIL) 25 MG tablet TAKE 1 TABLET BY MOUTH  DAILY 90 tablet 3  . latanoprost (XALATAN) 0.005 % ophthalmic solution Place 1 drop into both eyes at bedtime. 7.5 mL 3  . levalbuterol (XOPENEX HFA) 45 MCG/ACT inhaler Inhale 1-2 puffs into the lungs every 4 (four) hours as needed for wheezing. 1 Inhaler 12  . levothyroxine (SYNTHROID, LEVOTHROID) 100 MCG tablet TAKE 1 TABLET BY MOUTH  DAILY 90 tablet 1  . magnesium oxide (MAG-OX) 400 MG tablet Take 400 mg by mouth daily.    . metFORMIN (GLUCOPHAGE) 500 MG tablet Take 1 tablet (500 mg total) by mouth 2 (two) times daily with a  meal. 180 tablet 3  . Multiple Vitamin (MULTIVITAMIN) tablet Take 1 tablet by mouth daily.    . mupirocin cream (BACTROBAN) 2 % Apply 1 application topically 2 (two) times daily. 15 g 0  . nitroGLYCERIN (NITROSTAT) 0.4 MG SL tablet Place 1 tablet (0.4 mg total) under the tongue every 5 (five) minutes as needed for chest pain. 90 tablet 3  . omeprazole (PRILOSEC) 20 MG capsule Take 1 capsule (20 mg total) by mouth  daily. 90 capsule 1  . oxyCODONE-acetaminophen (PERCOCET) 10-325 MG tablet Take 1 tablet by mouth every 8 (eight) hours as needed for pain.    . potassium gluconate (EQL POTASSIUM GLUCONATE) 595 (99 K) MG TABS tablet Take 595 mg by mouth daily.    . tadalafil (CIALIS) 20 MG tablet Take 1 tablet (20 mg total) by mouth daily as needed. For erectile dysfunction 10 tablet 1  . Tiotropium Bromide-Olodaterol (STIOLTO RESPIMAT) 2.5-2.5 MCG/ACT AERS Inhale 2 puffs into the lungs daily. 2 Inhaler 0  . tiZANidine (ZANAFLEX) 4 MG capsule Take 1 capsule (4 mg total) by mouth at bedtime. 90 capsule 1  . vitamin C (ASCORBIC ACID) 500 MG tablet Take 1,000 mg by mouth daily.     Marland Kitchen VITAMIN D, CHOLECALCIFEROL, PO Take 50 mcg by mouth daily.    . zaleplon (SONATA) 10 MG capsule Take 1 capsule (10 mg total) by mouth at bedtime as needed for sleep. 90 capsule 0  . zolpidem (AMBIEN) 10 MG tablet Take 1 tablet (10 mg total) by mouth at bedtime as needed for sleep. 30 tablet 0   No current facility-administered medications on file prior to visit.     Past Medical History:  Diagnosis Date  . Atrial fibrillation (HCC)    a. paroxysmal, on Eliquis for anticoagulation  . BPH (benign prostatic hyperplasia)   . Bronchitis   . Cataract   . COPD (chronic obstructive pulmonary disease) (HCC)    2 liters O2 HS  . Depression   . Fatty tumor    waste and back  . Fibromyalgia   . GERD (gastroesophageal reflux disease)   . Glaucoma    bilateral eyes  . Gout   . Hereditary and idiopathic peripheral neuropathy  08/28/2015  . Hypertension   . Hypothyroidism   . Hypoxia   . Insomnia   . Memory disorder 08/28/2015  . On home oxygen therapy    at night with cpap  . Osteoarthritis   . Osteoporosis   . Peripheral neuropathy   . Rotator cuff tear, left   . Sleep apnea    uses cpap-add oxygen at night  . Spinal compression fracture (HCC) seventh vertebre  . Transient alteration of awareness 08/28/2015  . Type 2 diabetes mellitus ([place]) 03/08/2016  . Wears glasses     Past Surgical History:  Procedure Laterality Date  . APPENDECTOMY  age 51  . CARPAL TUNNEL RELEASE Right    early 2000s  . CATARACT EXTRACTION     bilateral  . CHOLECYSTECTOMY  age 75  . EYE SURGERY    . FOOT ARTHRODESIS Right 02/02/2013   Procedure: RIGHT HALLUX METATARSAL PHALANGEAL JOINT ARTHRODESIS ;  Surgeon: Wylene Simmer, MD;  Location: [facility];  Service: Orthopedics;  Laterality: Right;  . INGUINAL HERNIA REPAIR  age 72   rt side  . NASAL CONCHA BULLOSA RESECTION  age 89  . PROSTATE SURGERY    . SHOULDER ARTHROSCOPY W/ ROTATOR CUFF REPAIR Right    early 2000s  . tonsil    . VASECTOMY  age 53    Social History   Socioeconomic History  . Marital status: Married    Spouse name: Not on file  . Number of children: Not on file  . Years of education: Not on file  . Highest education level: Not on file  Occupational History  . Occupation: RETIRED    Employer: RETIRED  Social Needs  . Financial resource strain: Not on file  . Food  insecurity:    Worry: Not on file    Inability: Not on file  . Transportation needs:    Medical: Not on file    Non-medical: Not on file  Tobacco Use  . Smoking status: Former Smoker    Packs/day: 2.00    Years: 10.00    Pack years: 20.00    Types: Cigarettes    Last attempt to quit: 07/18/1975    Years since quitting: 42.6  . Smokeless tobacco: Never Used  Substance and Sexual Activity  . Alcohol use: Yes    Comment: 1-2 drinks/day  . Drug use: No  . Sexual  activity: Not on file  Lifestyle  . Physical activity:    Days per week: Not on file    Minutes per session: Not on file  . Stress: Not on file  Relationships  . Social connections:    Talks on phone: Not on file    Gets together: Not on file    Attends religious service: Not on file    Active member of club or organization: Not on file    Attends meetings of clubs or organizations: Not on file    Relationship status: Not on file  Other Topics Concern  . Not on file  Social History Narrative  . Not on file    Family History  Problem Relation Age of Onset  . Coronary artery disease Brother   . Heart disease Father   . Lung cancer Father   . Kidney cancer Father   . Prostate cancer Father   . Arthritis Mother   . Lung cancer Mother     Review of Systems  Constitutional: Negative for chills and fever.  Respiratory: Negative for cough, shortness of breath and wheezing.   Cardiovascular: Negative for chest pain, palpitations and leg swelling.  Gastrointestinal: Negative for abdominal pain, blood in stool, constipation, diarrhea and nausea.  Musculoskeletal: Positive for arthralgias.  Neurological: Positive for headaches (occasional). Negative for light-headedness.       Objective:   Vitals:   02/15/18 0832  BP: 140/68  Pulse: (!) 57  Resp: 16  Temp: 97.8 F (36.6 C)  SpO2: 95%   BP Readings from Last 3 Encounters:  02/15/18 140/68  01/26/18 110/64  01/25/18 138/72   Wt Readings from Last 3 Encounters:  02/15/18 204 lb (92.5 kg)  01/26/18 202 lb (91.6 kg)  01/25/18 202 lb (91.6 kg)   Body mass index is 32.93 kg/m.   Physical Exam    Constitutional: Appears well-developed and well-nourished. No distress.  HENT:  Head: Normocephalic and atraumatic.  Neck: Neck supple. No tracheal deviation present. No thyromegaly present.  No cervical lymphadenopathy Cardiovascular: Normal rate, regular rhythm and normal heart sounds.   No murmur heard. No carotid bruit  .  No edema Pulmonary/Chest: Effort normal and breath sounds normal. No respiratory distress. No has no wheezes. No rales.  Skin: Skin is warm and dry. Not diaphoretic.  Psychiatric: Normal mood and affect. Behavior is normal.      Assessment & Plan:    See Problem List for Assessment and Plan of chronic medical problems.

## 2018-02-13 NOTE — Patient Instructions (Addendum)
  Test(s) ordered today. Your results will be released to [facility] (or called to you) after review, usually within 72hours after test completion. If any changes need to be made, you will be notified at that same time.   Flu immunization administered today.    Medications reviewed and updated.  Changes include stopping the fosamax (alendronate) since your bone density in May was normal.  Try taking fiber daily.     Your prescription(s) have been submitted to your pharmacy. Please take as directed and contact our office if you believe you are having problem(s) with the medication(s).  A referral was ordered for Neurology for memory evaluation again and orthopedics for your hand pain.   Please followup in 6 months

## 2018-02-15 ENCOUNTER — Other Ambulatory Visit: Payer: Self-pay | Admitting: Internal Medicine

## 2018-02-15 ENCOUNTER — Encounter: Payer: Self-pay | Admitting: Internal Medicine

## 2018-02-15 ENCOUNTER — Encounter: Payer: Self-pay | Admitting: Psychology

## 2018-02-15 ENCOUNTER — Ambulatory Visit (INDEPENDENT_AMBULATORY_CARE_PROVIDER_SITE_OTHER): Payer: Medicare Other | Admitting: Internal Medicine

## 2018-02-15 ENCOUNTER — Other Ambulatory Visit (INDEPENDENT_AMBULATORY_CARE_PROVIDER_SITE_OTHER): Payer: Medicare Other

## 2018-02-15 VITALS — BP 140/68 | HR 57 | Temp 97.8°F | Resp 16 | Ht 66.0 in | Wt 204.0 lb

## 2018-02-15 DIAGNOSIS — E038 Other specified hypothyroidism: Secondary | ICD-10-CM

## 2018-02-15 DIAGNOSIS — E785 Hyperlipidemia, unspecified: Secondary | ICD-10-CM | POA: Diagnosis not present

## 2018-02-15 DIAGNOSIS — E119 Type 2 diabetes mellitus without complications: Secondary | ICD-10-CM | POA: Diagnosis not present

## 2018-02-15 DIAGNOSIS — I10 Essential (primary) hypertension: Secondary | ICD-10-CM | POA: Diagnosis not present

## 2018-02-15 DIAGNOSIS — M109 Gout, unspecified: Secondary | ICD-10-CM | POA: Diagnosis not present

## 2018-02-15 DIAGNOSIS — R194 Change in bowel habit: Secondary | ICD-10-CM | POA: Diagnosis not present

## 2018-02-15 DIAGNOSIS — M19041 Primary osteoarthritis, right hand: Secondary | ICD-10-CM

## 2018-02-15 DIAGNOSIS — F329 Major depressive disorder, single episode, unspecified: Secondary | ICD-10-CM | POA: Diagnosis not present

## 2018-02-15 DIAGNOSIS — R413 Other amnesia: Secondary | ICD-10-CM

## 2018-02-15 DIAGNOSIS — Z23 Encounter for immunization: Secondary | ICD-10-CM | POA: Diagnosis not present

## 2018-02-15 DIAGNOSIS — G4700 Insomnia, unspecified: Secondary | ICD-10-CM | POA: Diagnosis not present

## 2018-02-15 DIAGNOSIS — L659 Nonscarring hair loss, unspecified: Secondary | ICD-10-CM | POA: Diagnosis not present

## 2018-02-15 DIAGNOSIS — M19042 Primary osteoarthritis, left hand: Secondary | ICD-10-CM

## 2018-02-15 DIAGNOSIS — F32A Depression, unspecified: Secondary | ICD-10-CM

## 2018-02-15 LAB — COMPREHENSIVE METABOLIC PANEL
ALT: 14 U/L (ref 0–53)
AST: 15 U/L (ref 0–37)
Albumin: 4.5 g/dL (ref 3.5–5.2)
Alkaline Phosphatase: 87 U/L (ref 39–117)
BUN: 20 mg/dL (ref 6–23)
CO2: 27 mEq/L (ref 19–32)
Calcium: 9.9 mg/dL (ref 8.4–10.5)
Chloride: 98 mEq/L (ref 96–112)
Creatinine, Ser: 1.09 mg/dL (ref 0.40–1.50)
GFR: 68.72 mL/min (ref >60.00)
Glucose, Bld: 131 mg/dL — ABNORMAL HIGH (ref 70–99)
Potassium: 3.6 mEq/L (ref 3.5–5.1)
Sodium: 138 mEq/L (ref 135–145)
Total Bilirubin: 0.5 mg/dL (ref 0.2–1.2)
Total Protein: 6.8 g/dL (ref 6.0–8.3)

## 2018-02-15 LAB — CBC WITH DIFFERENTIAL/PLATELET
Basophils Absolute: 0 10*3/uL (ref 0.0–0.1)
Basophils Relative: 0.2 % (ref 0.0–3.0)
Eosinophils Absolute: 0 10*3/uL (ref 0.0–0.7)
Eosinophils Relative: 0.4 % (ref 0.0–5.0)
HCT: 42.2 % (ref 39.0–52.0)
Hemoglobin: 14.2 g/dL (ref 13.0–17.0)
Lymphocytes Relative: 38.6 % (ref 12.0–46.0)
Lymphs Abs: 2.1 10*3/uL (ref 0.7–4.0)
MCHC: 33.7 g/dL (ref 30.0–36.0)
MCV: 89.5 fl (ref 78.0–100.0)
Monocytes Absolute: 1.3 10*3/uL — ABNORMAL HIGH (ref 0.1–1.0)
Monocytes Relative: 23.6 % — ABNORMAL HIGH (ref 3.0–12.0)
Neutro Abs: 2.1 10*3/uL (ref 1.4–7.7)
Neutrophils Relative %: 37.2 % — ABNORMAL LOW (ref 43.0–77.0)
Platelets: 121 10*3/uL — ABNORMAL LOW (ref 150.0–400.0)
RBC: 4.72 Mil/uL (ref 4.22–5.81)
RDW: 13.5 % (ref 11.5–15.5)
WBC: 5.5 10*3/uL (ref 4.0–10.5)

## 2018-02-15 LAB — HEMOGLOBIN A1C: Hgb A1c MFr Bld: 7.1 % — ABNORMAL HIGH (ref 4.6–6.5)

## 2018-02-15 LAB — TSH: TSH: 6.87 u[IU]/mL — ABNORMAL HIGH (ref 0.35–4.50)

## 2018-02-15 MED ORDER — MOMETASONE FUROATE 50 MCG/ACT NA SUSP: 2.0000 | Freq: Every day | NASAL | 3 refills | Status: DC

## 2018-02-15 NOTE — Assessment & Plan Note (Signed)
Has bilateral hand osteoarthritis Has seen orthopedics in the past and at that time they decided on conservative treatment They did discuss injections, which he thinks he needs to consider at this time Will refer back to orthopedics for further evaluation and treatment

## 2018-02-15 NOTE — Assessment & Plan Note (Signed)
Has seen Dr. Si Raider at [facility] for evaluation He feels over the past couple of months his memory has worsened and he would like to be retested. When significant change that has occurred during that time was that he has been taking the oxycodone consistently-he takes it every night Discussed that this may be causing more memory difficulties, which he understands He would still like to be retested Will refer

## 2018-02-15 NOTE — Assessment & Plan Note (Signed)
Taking allopurinol No gout symptoms Continue current dose of allopurinol

## 2018-02-15 NOTE — Assessment & Plan Note (Signed)
Check lipid panel  Continue daily statin Regular exercise and healthy diet encouraged  

## 2018-02-15 NOTE — Assessment & Plan Note (Signed)
Having frequent bowel movements that are semisolid and do not seem to be complete Also experiencing excessive gas, which is chronic Taking probiotics Advised that he could try holding probiotics to see if there is any change Also advised trying daily fiber such as Metamucil to see if that helps

## 2018-02-15 NOTE — Assessment & Plan Note (Signed)
Her loss is diffuse on head, but nowhere else No scalp redness or dryness He has an appointment with dermatology next week and will discuss with them

## 2018-02-15 NOTE — Assessment & Plan Note (Signed)
Blood pressure reasonably controlled  Current regimen effective and well tolerated Continue current medications at current doses CMP

## 2018-02-15 NOTE — Assessment & Plan Note (Signed)
Controlled, stable Continue current dose of medication-Cymbalta 90 mg daily

## 2018-02-15 NOTE — Assessment & Plan Note (Signed)
Check A1c Encourage continuing regular exercise and increasing the possible Ideally encouraged weight loss Continue current medications-we will adjust if needed

## 2018-02-15 NOTE — Assessment & Plan Note (Signed)
Check tsh  Titrate med dose if needed  

## 2018-02-15 NOTE — Assessment & Plan Note (Signed)
Currently taking Ambien nightly, which is effective and he is not have any side effects We will continue In the past after being on any medication for certain amount of time it we will stop working and when that occurs we we can change him back to [employer]

## 2018-02-16 ENCOUNTER — Other Ambulatory Visit: Payer: Self-pay | Admitting: Internal Medicine

## 2018-02-16 DIAGNOSIS — M1712 Unilateral primary osteoarthritis, left knee: Secondary | ICD-10-CM | POA: Diagnosis not present

## 2018-02-16 MED ORDER — LEVOTHYROXINE SODIUM 112 MCG PO TABS: 112.0000 ug | ORAL_TABLET | Freq: Every day | ORAL | 0 refills | Status: DC

## 2018-02-23 DIAGNOSIS — M47817 Spondylosis without myelopathy or radiculopathy, lumbosacral region: Secondary | ICD-10-CM | POA: Diagnosis not present

## 2018-02-24 DIAGNOSIS — M1711 Unilateral primary osteoarthritis, right knee: Secondary | ICD-10-CM | POA: Diagnosis not present

## 2018-02-25 ENCOUNTER — Encounter: Payer: Self-pay | Admitting: Internal Medicine

## 2018-02-25 DIAGNOSIS — D171 Benign lipomatous neoplasm of skin and subcutaneous tissue of trunk: Secondary | ICD-10-CM

## 2018-03-01 DIAGNOSIS — M545 Low back pain: Secondary | ICD-10-CM | POA: Diagnosis not present

## 2018-03-01 DIAGNOSIS — M47896 Other spondylosis, lumbar region: Secondary | ICD-10-CM | POA: Diagnosis not present

## 2018-03-02 ENCOUNTER — Ambulatory Visit: Payer: Self-pay | Admitting: Internal Medicine

## 2018-03-02 ENCOUNTER — Encounter: Payer: Self-pay | Admitting: Family

## 2018-03-02 ENCOUNTER — Other Ambulatory Visit (INDEPENDENT_AMBULATORY_CARE_PROVIDER_SITE_OTHER): Payer: Medicare Other

## 2018-03-02 ENCOUNTER — Ambulatory Visit (INDEPENDENT_AMBULATORY_CARE_PROVIDER_SITE_OTHER): Payer: Medicare Other | Admitting: Family

## 2018-03-02 ENCOUNTER — Other Ambulatory Visit: Payer: Self-pay | Admitting: Cardiology

## 2018-03-02 VITALS — BP 144/80 | HR 83 | Temp 98.3°F | Ht 66.0 in | Wt 204.0 lb

## 2018-03-02 DIAGNOSIS — D2261 Melanocytic nevi of right upper limb, including shoulder: Secondary | ICD-10-CM | POA: Diagnosis not present

## 2018-03-02 DIAGNOSIS — D225 Melanocytic nevi of trunk: Secondary | ICD-10-CM | POA: Diagnosis not present

## 2018-03-02 DIAGNOSIS — I480 Paroxysmal atrial fibrillation: Secondary | ICD-10-CM

## 2018-03-02 DIAGNOSIS — L218 Other seborrheic dermatitis: Secondary | ICD-10-CM | POA: Diagnosis not present

## 2018-03-02 DIAGNOSIS — R319 Hematuria, unspecified: Secondary | ICD-10-CM

## 2018-03-02 DIAGNOSIS — L281 Prurigo nodularis: Secondary | ICD-10-CM | POA: Diagnosis not present

## 2018-03-02 DIAGNOSIS — D485 Neoplasm of uncertain behavior of skin: Secondary | ICD-10-CM | POA: Diagnosis not present

## 2018-03-02 DIAGNOSIS — L821 Other seborrheic keratosis: Secondary | ICD-10-CM | POA: Diagnosis not present

## 2018-03-02 DIAGNOSIS — L820 Inflamed seborrheic keratosis: Secondary | ICD-10-CM | POA: Diagnosis not present

## 2018-03-02 LAB — URINALYSIS, ROUTINE W REFLEX MICROSCOPIC
Bilirubin Urine: NEGATIVE
Hgb urine dipstick: NEGATIVE
Ketones, ur: NEGATIVE
Leukocytes, UA: NEGATIVE
Nitrite: NEGATIVE
RBC / HPF: NONE SEEN (ref 0–?)
Specific Gravity, Urine: 1.02 (ref 1.000–1.030)
Total Protein, Urine: 30 — AB
Urine Glucose: NEGATIVE
Urobilinogen, UA: 0.2 (ref 0.0–1.0)
WBC, UA: NONE SEEN (ref 0–?)
pH: 6 (ref 5.0–8.0)

## 2018-03-02 LAB — CBC WITH DIFFERENTIAL/PLATELET
Basophils Absolute: 0 10*3/uL (ref 0.0–0.1)
Basophils Relative: 0.4 % (ref 0.0–3.0)
Eosinophils Absolute: 0 10*3/uL (ref 0.0–0.7)
Eosinophils Relative: 0.2 % (ref 0.0–5.0)
HCT: 41.6 % (ref 39.0–52.0)
Hemoglobin: 13.9 g/dL (ref 13.0–17.0)
Lymphocytes Relative: 32.5 % (ref 12.0–46.0)
Lymphs Abs: 2.1 10*3/uL (ref 0.7–4.0)
MCHC: 33.5 g/dL (ref 30.0–36.0)
MCV: 89.8 fl (ref 78.0–100.0)
Monocytes Absolute: 1.3 10*3/uL — ABNORMAL HIGH (ref 0.1–1.0)
Monocytes Relative: 19.8 % — ABNORMAL HIGH (ref 3.0–12.0)
Neutro Abs: 3 10*3/uL (ref 1.4–7.7)
Neutrophils Relative %: 46.7 % (ref 43.0–77.0)
Platelets: 118 10*3/uL — ABNORMAL LOW (ref 150.0–400.0)
RBC: 4.63 Mil/uL (ref 4.22–5.81)
RDW: 13.7 % (ref 11.5–15.5)
WBC: 6.5 10*3/uL (ref 4.0–10.5)

## 2018-03-02 NOTE — Telephone Encounter (Signed)
Pt stated he was taking a shower and noted blood coming from his scrotum. Pt estimated the amounts of blood to be 1/8 of a cup of blood. Pt denies any trauma or any issues with his scrotum. Pt stated he urinated and noted his urine to be clear and yellow, no blood noted.  Pt denies pain. Pt stated he placed some napkins under his scrotum and has not noticed any further bleeding. Pt denies fever, abdominal pain.  Care advice given and pt stated understanding. No availability with PCP today. Appointment made with Jodi Mourning FNP at 2:00 pm.  Reason for Disposition . All other penis - scrotum symptoms  (Exception: painless rash < 24 hours duration)    Questionable scrotal bleeding.  Answer Assessment - Initial Assessment Questions 1. SYMPTOM: "What's the main symptom you're concerned about?" (e.g., discharge from penis, rash, pain, itching, swelling)     Bleeding 1/8 th of a cup of blood noted from scrotum  2. LOCATION: "Where is the blood located?"     scrotum 3. ONSET: "When did bleeding  start?"     0800 4. PAIN: "Is there any pain?" If so, ask: "How bad is it?"  (Scale 1-10; or mild, moderate, severe)     no 5. URINE: "Any difficulty passing urine?" If so, ask: "When was the last time?"     no 6. CAUSE: "What do you think is causing the symptoms?"     Pt does not know 7. OTHER SYMPTOMS: "Do you have any other symptoms?" (e.g., fever, abdominal pain, blood in urine)     no  Protocols used: PENIS AND SCROTUM [facility]

## 2018-03-02 NOTE — Progress Notes (Signed)
Jonathan [name] is a 84 y.o. male with the following history as recorded in [employer]:  Patient Active Problem List   Diagnosis Date Noted  . Frequent bowel movements 02/15/2018  . Hair loss 02/15/2018  . Skin abnormalities 01/26/2018  . Chronic anticoagulation 12/21/2017  . Polypharmacy 12/21/2017  . Mid back pain on left side 08/10/2017  . Panic attack 08/09/2017  . COPD with acute exacerbation ([place]) 07/23/2017  . Spinal compression fracture ([place])   . Sleep apnea   . Rotator cuff tear, left   . Osteoarthritis   . On home oxygen therapy   . Hypoxia   . Fibromyalgia   . Fatty tumor   . Cataract   . BPH (benign prostatic hyperplasia)   . PAF (paroxysmal atrial fibrillation) ([place])   . Insomnia 03/10/2017  . Chronic pain, legs and back 03/10/2017  . Hyperlipidemia 03/10/2017  . Obesity (BMI 30.0-34.9) 12/08/2016  . Depression 09/07/2016  . Osteoporosis 09/07/2016  . Hypothyroidism 03/08/2016  . GERD (gastroesophageal reflux disease) 03/08/2016  . Gout 03/08/2016  . Glaucoma 03/08/2016  . Type 2 diabetes mellitus ([place]) 03/08/2016  . Hereditary and idiopathic peripheral neuropathy 08/28/2015  . Memory disorder 08/28/2015  . Fatigue 01/08/2015  . Bruit 08/15/2013  . Essential hypertension 12/04/2011  . Allergic rhinitis, seasonal 12/04/2011  . Sleep apnea, obstructive 08/25/2011  . COPD (chronic obstructive pulmonary disease) ([place]) 07/18/2011    Current Outpatient Medications  Medication Sig Dispense Refill  . acetaminophen (TYLENOL) 500 MG tablet Take 2 tablets (1,000 mg total) by mouth every 8 (eight) hours as needed. 360 tablet 1  . allopurinol (ZYLOPRIM) 300 MG tablet TAKE 1 TABLET BY MOUTH  EVERY EVENING 90 tablet 1  . apixaban (ELIQUIS) 5 MG TABS tablet Take 1 tablet (5 mg total) by mouth 2 (two) times daily. 180 tablet 1  . atorvastatin (LIPITOR) 20 MG tablet TAKE 1 TABLET BY MOUTH  DAILY 90 tablet 3  . Biotin 5000 MCG TABS Take 5,000 mcg by mouth daily.    . blood  glucose meter kit and supplies KIT Dispense based on patient and insurance preference. Use up to four times daily as directed. (FOR E11.9). 1 each 0  . brimonidine (ALPHAGAN) 0.15 % ophthalmic solution Place 1 drop into the left eye 2 (two) times daily.     . calcium-vitamin D (OSCAL WITH D) 500-200 MG-UNIT tablet Take 1 tablet by mouth daily with breakfast.    . clobetasol (TEMOVATE) 0.05 % external solution Apply 1 application topically 2 (two) times daily.    . colchicine (COLCRYS) 0.6 MG tablet Take 0.6 mg by mouth daily as needed (flareups).     . diltiazem (CARDIZEM CD) 300 MG 24 hr capsule Take 1 capsule (300 mg total) daily by mouth. 90 capsule 3  . doxazosin (CARDURA) 8 MG tablet TAKE 1 TABLET BY MOUTH  DAILY 90 tablet 1  . DULoxetine (CYMBALTA) 30 MG capsule TAKE 2 CAPSULES BY MOUTH IN THE MORNING AND 1 CAPSULE  BY MOUTH IN THE EVENING 270 capsule 1  . hydrochlorothiazide (HYDRODIURIL) 25 MG tablet TAKE 1 TABLET BY MOUTH  DAILY 90 tablet 3  . latanoprost (XALATAN) 0.005 % ophthalmic solution Place 1 drop into both eyes at bedtime. 7.5 mL 3  . levalbuterol (XOPENEX HFA) 45 MCG/ACT inhaler Inhale 1-2 puffs into the lungs every 4 (four) hours as needed for wheezing. 1 Inhaler 12  . levothyroxine (SYNTHROID, LEVOTHROID) 112 MCG tablet Take 1 tablet (112 mcg total) by mouth daily. 90 tablet  0  . magnesium oxide (MAG-OX) 400 MG tablet Take 400 mg by mouth daily.    . metFORMIN (GLUCOPHAGE) 500 MG tablet Take 1 tablet (500 mg total) by mouth 2 (two) times daily with a meal. 180 tablet 3  . mometasone (NASONEX) 50 MCG/ACT nasal spray Place 2 sprays into the nose daily. 17 g 3  . Multiple Vitamin (MULTIVITAMIN) tablet Take 1 tablet by mouth daily.    . mupirocin cream (BACTROBAN) 2 % Apply 1 application topically 2 (two) times daily. 15 g 0  . nitroGLYCERIN (NITROSTAT) 0.4 MG SL tablet Place 1 tablet (0.4 mg total) under the tongue every 5 (five) minutes as needed for chest pain. 90 tablet 3  .  omeprazole (PRILOSEC) 20 MG capsule TAKE 1 CAPSULE BY MOUTH  DAILY 90 capsule 1  . oxyCODONE-acetaminophen (PERCOCET) 10-325 MG tablet Take 1 tablet by mouth every 8 (eight) hours as needed for pain.    . potassium gluconate (EQL POTASSIUM GLUCONATE) 595 (99 K) MG TABS tablet Take 595 mg by mouth daily.    . tadalafil (CIALIS) 20 MG tablet Take 1 tablet (20 mg total) by mouth daily as needed. For erectile dysfunction 10 tablet 1  . Tiotropium Bromide-Olodaterol (STIOLTO RESPIMAT) 2.5-2.5 MCG/ACT AERS Inhale 2 puffs into the lungs daily. 2 Inhaler 0  . tiZANidine (ZANAFLEX) 4 MG capsule Take 1 capsule (4 mg total) by mouth at bedtime. 90 capsule 1  . vitamin C (ASCORBIC ACID) 500 MG tablet Take 1,000 mg by mouth daily.     Marland Kitchen VITAMIN D, CHOLECALCIFEROL, PO Take 50 mcg by mouth daily.    Marland Kitchen zolpidem (AMBIEN) 10 MG tablet Take 1 tablet (10 mg total) by mouth at bedtime as needed for sleep. 30 tablet 0   No current facility-administered medications for this visit.     Allergies: Other  Past Medical History:  Diagnosis Date  . Atrial fibrillation (HCC)    a. paroxysmal, on Eliquis for anticoagulation  . BPH (benign prostatic hyperplasia)   . Bronchitis   . Cataract   . COPD (chronic obstructive pulmonary disease) (HCC)    2 liters O2 HS  . Depression   . Fatty tumor    waste and back  . Fibromyalgia   . GERD (gastroesophageal reflux disease)   . Glaucoma    bilateral eyes  . Gout   . Hereditary and idiopathic peripheral neuropathy 08/28/2015  . Hypertension   . Hypothyroidism   . Hypoxia   . Insomnia   . Memory disorder 08/28/2015  . On home oxygen therapy    at night with cpap  . Osteoarthritis   . Osteoporosis   . Peripheral neuropathy   . Rotator cuff tear, left   . Sleep apnea    uses cpap-add oxygen at night  . Spinal compression fracture (HCC) seventh vertebre  . Transient alteration of awareness 08/28/2015  . Type 2 diabetes mellitus ([place]) 03/08/2016  . Wears glasses      Past Surgical History:  Procedure Laterality Date  . APPENDECTOMY  age 40  . CARPAL TUNNEL RELEASE Right    early 2000s  . CATARACT EXTRACTION     bilateral  . CHOLECYSTECTOMY  age 51  . EYE SURGERY    . FOOT ARTHRODESIS Right 02/02/2013   Procedure: RIGHT HALLUX METATARSAL PHALANGEAL JOINT ARTHRODESIS ;  Surgeon: Wylene Simmer, MD;  Location: [facility];  Service: Orthopedics;  Laterality: Right;  . INGUINAL HERNIA REPAIR  age 62   rt side  .  NASAL CONCHA BULLOSA RESECTION  age 31  . PROSTATE SURGERY    . SHOULDER ARTHROSCOPY W/ ROTATOR CUFF REPAIR Right    early 2000s  . tonsil    . VASECTOMY  age 43    Family History  Problem Relation Age of Onset  . Coronary artery disease Brother   . Heart disease Father   . Lung cancer Father   . Kidney cancer Father   . Prostate cancer Father   . Arthritis Mother   . Lung cancer Mother     Social History   Tobacco Use  . Smoking status: Former Smoker    Packs/day: 2.00    Years: 10.00    Pack years: 20.00    Types: Cigarettes    Last attempt to quit: 07/18/1975    Years since quitting: 42.6  . Smokeless tobacco: Never Used  Substance Use Topics  . Alcohol use: Yes    Comment: 1-2 drinks/day    Subjective:  Patient had an episode of "scrotal bleeding" while he was in the shower this morning. Actually is unsure of where the blood was coming from; has urinated twice since episode and seen no blood in the urine; no problems with hemorrhoids; no pain with situation;    Objective:  Vitals:   03/02/18 1410  BP: (!) 144/80  Pulse: 83  Temp: 98.3 F (36.8 C)  TempSrc: Oral  SpO2: 95%  Weight: 204 lb 0.6 oz (92.6 kg)  Height: 5' 6"  (1.676 m)    General: Well developed, well nourished, in no acute distress  Skin : Warm and dry.  Head: Normocephalic and atraumatic  Lungs: Respirations unlabored;  Neurologic: Alert and oriented; speech intact; face symmetrical; moves all extremities well; CNII-XII intact without  focal deficit  Pelvic exam- no abnormality noted in scrotum, penis;   Assessment:  1. Hematuria, unspecified type     Plan:  Physical exam is reassuring; no source of bleeding noted; will update CBC, urine today; follow-up to be determined.   No follow-ups on file.  Orders Placed This Encounter  Procedures  . Urinalysis, Routine w reflex microscopic    Standing Status:   Future    Standing Expiration Date:   03/02/2019  . CBC w/Diff    Standing Status:   Future    Standing Expiration Date:   03/02/2019    Requested Prescriptions    No prescriptions requested or ordered in this encounter

## 2018-03-03 DIAGNOSIS — M47896 Other spondylosis, lumbar region: Secondary | ICD-10-CM | POA: Diagnosis not present

## 2018-03-03 DIAGNOSIS — M545 Low back pain: Secondary | ICD-10-CM | POA: Diagnosis not present

## 2018-03-04 ENCOUNTER — Other Ambulatory Visit: Payer: Self-pay | Admitting: Cardiology

## 2018-03-05 ENCOUNTER — Other Ambulatory Visit: Payer: Self-pay | Admitting: Emergency Medicine

## 2018-03-06 ENCOUNTER — Emergency Department (HOSPITAL_COMMUNITY): Payer: Medicare Other

## 2018-03-06 ENCOUNTER — Encounter (HOSPITAL_COMMUNITY): Payer: Self-pay

## 2018-03-06 ENCOUNTER — Observation Stay (HOSPITAL_COMMUNITY)
Admission: EM | Admit: 2018-03-06 | Discharge: 2018-03-07 | Disposition: A | Discharge status: Home / Self Care | Payer: Medicare Other | Attending: Internal Medicine | Admitting: Internal Medicine

## 2018-03-06 ENCOUNTER — Other Ambulatory Visit: Payer: Self-pay

## 2018-03-06 DIAGNOSIS — M542 Cervicalgia: Secondary | ICD-10-CM | POA: Diagnosis not present

## 2018-03-06 DIAGNOSIS — I10 Essential (primary) hypertension: Secondary | ICD-10-CM

## 2018-03-06 DIAGNOSIS — Z23 Encounter for immunization: Secondary | ICD-10-CM | POA: Insufficient documentation

## 2018-03-06 DIAGNOSIS — S0003XA Contusion of scalp, initial encounter: Principal | ICD-10-CM | POA: Insufficient documentation

## 2018-03-06 DIAGNOSIS — W0110XA Fall on same level from slipping, tripping and stumbling with subsequent striking against unspecified object, initial encounter: Secondary | ICD-10-CM | POA: Diagnosis not present

## 2018-03-06 DIAGNOSIS — E039 Hypothyroidism, unspecified: Secondary | ICD-10-CM | POA: Diagnosis not present

## 2018-03-06 DIAGNOSIS — Y92009 Unspecified place in unspecified non-institutional (private) residence as the place of occurrence of the external cause: Secondary | ICD-10-CM | POA: Diagnosis not present

## 2018-03-06 DIAGNOSIS — S4992XA Unspecified injury of left shoulder and upper arm, initial encounter: Secondary | ICD-10-CM | POA: Diagnosis not present

## 2018-03-06 DIAGNOSIS — Z7901 Long term (current) use of anticoagulants: Secondary | ICD-10-CM

## 2018-03-06 DIAGNOSIS — Z7984 Long term (current) use of oral hypoglycemic drugs: Secondary | ICD-10-CM | POA: Insufficient documentation

## 2018-03-06 DIAGNOSIS — M109 Gout, unspecified: Secondary | ICD-10-CM | POA: Diagnosis present

## 2018-03-06 DIAGNOSIS — I629 Nontraumatic intracranial hemorrhage, unspecified: Secondary | ICD-10-CM | POA: Diagnosis not present

## 2018-03-06 DIAGNOSIS — I482 Chronic atrial fibrillation, unspecified: Secondary | ICD-10-CM | POA: Diagnosis present

## 2018-03-06 DIAGNOSIS — M79641 Pain in right hand: Secondary | ICD-10-CM | POA: Diagnosis not present

## 2018-03-06 DIAGNOSIS — S199XXA Unspecified injury of neck, initial encounter: Secondary | ICD-10-CM | POA: Diagnosis not present

## 2018-03-06 DIAGNOSIS — Y999 Unspecified external cause status: Secondary | ICD-10-CM | POA: Insufficient documentation

## 2018-03-06 DIAGNOSIS — W19XXXA Unspecified fall, initial encounter: Secondary | ICD-10-CM | POA: Diagnosis not present

## 2018-03-06 DIAGNOSIS — T07XXXA Unspecified multiple injuries, initial encounter: Secondary | ICD-10-CM

## 2018-03-06 DIAGNOSIS — I480 Paroxysmal atrial fibrillation: Secondary | ICD-10-CM | POA: Diagnosis not present

## 2018-03-06 DIAGNOSIS — J449 Chronic obstructive pulmonary disease, unspecified: Secondary | ICD-10-CM | POA: Diagnosis present

## 2018-03-06 DIAGNOSIS — S0990XA Unspecified injury of head, initial encounter: Secondary | ICD-10-CM | POA: Diagnosis not present

## 2018-03-06 DIAGNOSIS — E119 Type 2 diabetes mellitus without complications: Secondary | ICD-10-CM | POA: Diagnosis not present

## 2018-03-06 DIAGNOSIS — Z87891 Personal history of nicotine dependence: Secondary | ICD-10-CM | POA: Insufficient documentation

## 2018-03-06 DIAGNOSIS — S1093XA Contusion of unspecified part of neck, initial encounter: Secondary | ICD-10-CM | POA: Diagnosis not present

## 2018-03-06 DIAGNOSIS — Z79899 Other long term (current) drug therapy: Secondary | ICD-10-CM | POA: Insufficient documentation

## 2018-03-06 DIAGNOSIS — S6992XA Unspecified injury of left wrist, hand and finger(s), initial encounter: Secondary | ICD-10-CM | POA: Diagnosis not present

## 2018-03-06 DIAGNOSIS — S0093XA Contusion of unspecified part of head, initial encounter: Secondary | ICD-10-CM | POA: Diagnosis not present

## 2018-03-06 DIAGNOSIS — G4733 Obstructive sleep apnea (adult) (pediatric): Secondary | ICD-10-CM

## 2018-03-06 DIAGNOSIS — I619 Nontraumatic intracerebral hemorrhage, unspecified: Secondary | ICD-10-CM | POA: Diagnosis not present

## 2018-03-06 DIAGNOSIS — S065X0A Traumatic subdural hemorrhage without loss of consciousness, initial encounter: Secondary | ICD-10-CM | POA: Diagnosis not present

## 2018-03-06 DIAGNOSIS — Y9301 Activity, walking, marching and hiking: Secondary | ICD-10-CM | POA: Diagnosis not present

## 2018-03-06 DIAGNOSIS — G609 Hereditary and idiopathic neuropathy, unspecified: Secondary | ICD-10-CM | POA: Diagnosis present

## 2018-03-06 DIAGNOSIS — M25532 Pain in left wrist: Secondary | ICD-10-CM | POA: Diagnosis not present

## 2018-03-06 DIAGNOSIS — S300XXA Contusion of lower back and pelvis, initial encounter: Secondary | ICD-10-CM | POA: Diagnosis not present

## 2018-03-06 DIAGNOSIS — S065X9A Traumatic subdural hemorrhage with loss of consciousness of unspecified duration, initial encounter: Secondary | ICD-10-CM

## 2018-03-06 DIAGNOSIS — S065XAA Traumatic subdural hemorrhage with loss of consciousness status unknown, initial encounter: Secondary | ICD-10-CM

## 2018-03-06 DIAGNOSIS — S299XXA Unspecified injury of thorax, initial encounter: Secondary | ICD-10-CM | POA: Diagnosis not present

## 2018-03-06 DIAGNOSIS — S6991XA Unspecified injury of right wrist, hand and finger(s), initial encounter: Secondary | ICD-10-CM | POA: Diagnosis not present

## 2018-03-06 DIAGNOSIS — S4991XA Unspecified injury of right shoulder and upper arm, initial encounter: Secondary | ICD-10-CM | POA: Diagnosis not present

## 2018-03-06 DIAGNOSIS — M25512 Pain in left shoulder: Secondary | ICD-10-CM | POA: Diagnosis not present

## 2018-03-06 DIAGNOSIS — R413 Other amnesia: Secondary | ICD-10-CM

## 2018-03-06 LAB — CBC WITH DIFFERENTIAL/PLATELET
Abs Immature Granulocytes: 0.1 10*3/uL (ref 0.0–0.1)
Basophils Absolute: 0 10*3/uL (ref 0.0–0.1)
Basophils Relative: 0 %
Eosinophils Absolute: 0 10*3/uL (ref 0.0–0.7)
Eosinophils Relative: 0 %
HCT: 44.4 % (ref 39.0–52.0)
Hemoglobin: 14.3 g/dL (ref 13.0–17.0)
Immature Granulocytes: 1 %
Lymphocytes Relative: 20 %
Lymphs Abs: 1.5 10*3/uL (ref 0.7–4.0)
MCH: 29.9 pg (ref 26.0–34.0)
MCHC: 32.2 g/dL (ref 30.0–36.0)
MCV: 92.9 fL (ref 78.0–100.0)
Monocytes Absolute: 1.3 10*3/uL — ABNORMAL HIGH (ref 0.1–1.0)
Monocytes Relative: 18 %
Neutro Abs: 4.3 10*3/uL (ref 1.7–7.7)
Neutrophils Relative %: 61 %
Platelets: 127 10*3/uL — ABNORMAL LOW (ref 150–400)
RBC: 4.78 MIL/uL (ref 4.22–5.81)
RDW: 13.1 % (ref 11.5–15.5)
WBC: 7.2 10*3/uL (ref 4.0–10.5)

## 2018-03-06 LAB — I-STAT CHEM 8, ED
BUN: 25 mg/dL — ABNORMAL HIGH (ref 8–23)
Calcium, Ion: 1.07 mmol/L — ABNORMAL LOW (ref 1.15–1.40)
Chloride: 103 mmol/L (ref 98–111)
Creatinine, Ser: 1 mg/dL (ref 0.61–1.24)
Glucose, Bld: 176 mg/dL — ABNORMAL HIGH (ref 70–99)
HCT: 43 % (ref 39.0–52.0)
Hemoglobin: 14.6 g/dL (ref 13.0–17.0)
Potassium: 4.7 mmol/L (ref 3.5–5.1)
Sodium: 139 mmol/L (ref 135–145)
TCO2: 30 mmol/L (ref 22–32)

## 2018-03-06 LAB — PROTIME-INR
INR: 1.12
Prothrombin Time: 14.3 seconds (ref 11.4–15.2)

## 2018-03-06 LAB — APTT: aPTT: 32 seconds (ref 24–36)

## 2018-03-06 LAB — BASIC METABOLIC PANEL
Anion gap: 8 (ref 5–15)
BUN: 17 mg/dL (ref 8–23)
CO2: 27 mmol/L (ref 22–32)
Calcium: 9.1 mg/dL (ref 8.9–10.3)
Chloride: 102 mmol/L (ref 98–111)
Creatinine, Ser: 1.03 mg/dL (ref 0.61–1.24)
GFR calc Af Amer: >60 mL/min (ref >60)
GFR calc non Af Amer: >60 mL/min (ref >60)
Glucose, Bld: 180 mg/dL — ABNORMAL HIGH (ref 70–99)
Potassium: 3.4 mmol/L — ABNORMAL LOW (ref 3.5–5.1)
Sodium: 137 mmol/L (ref 135–145)

## 2018-03-06 MED ORDER — VITAMIN C 500 MG PO TABS
1000.0000 mg | ORAL_TABLET | Freq: Every day | ORAL | Status: DC
  Administered 2018-03-07: 1000 mg via ORAL
  Filled 2018-03-06: qty 2

## 2018-03-06 MED ORDER — LEVOTHYROXINE SODIUM 112 MCG PO TABS
112.0000 ug | ORAL_TABLET | Freq: Every day | ORAL | Status: DC
  Administered 2018-03-07: 112 ug via ORAL
  Filled 2018-03-06: qty 1

## 2018-03-06 MED ORDER — DILTIAZEM HCL ER COATED BEADS 300 MG PO CP24
300.0000 mg | ORAL_CAPSULE | Freq: Every day | ORAL | Status: DC
  Administered 2018-03-07: 300 mg via ORAL
  Filled 2018-03-06: qty 1

## 2018-03-06 MED ORDER — ZOLPIDEM TARTRATE 5 MG PO TABS
5.0000 mg | ORAL_TABLET | Freq: Every evening | ORAL | Status: DC | PRN
  Administered 2018-03-07: 5 mg via ORAL
  Filled 2018-03-06: qty 1

## 2018-03-06 MED ORDER — CALCIUM CARBONATE-VITAMIN D 500-200 MG-UNIT PO TABS
1.0000 | ORAL_TABLET | Freq: Every day | ORAL | Status: DC
  Administered 2018-03-07: 1 via ORAL
  Filled 2018-03-06: qty 1

## 2018-03-06 MED ORDER — NITROGLYCERIN 0.4 MG SL SUBL: 0.4000 mg | SUBLINGUAL_TABLET | SUBLINGUAL | Status: DC | PRN

## 2018-03-06 MED ORDER — OXYCODONE-ACETAMINOPHEN 10-325 MG PO TABS: 1.0000 | ORAL_TABLET | Freq: Three times a day (TID) | ORAL | Status: DC | PRN

## 2018-03-06 MED ORDER — DOXAZOSIN MESYLATE 8 MG PO TABS
8.0000 mg | ORAL_TABLET | Freq: Every day | ORAL | Status: DC
  Administered 2018-03-07: 8 mg via ORAL
  Filled 2018-03-06: qty 1

## 2018-03-06 MED ORDER — TIZANIDINE HCL 4 MG PO TABS
4.0000 mg | ORAL_TABLET | Freq: Every day | ORAL | Status: DC
  Administered 2018-03-07: 4 mg via ORAL
  Filled 2018-03-06: qty 1

## 2018-03-06 MED ORDER — OXYCODONE-ACETAMINOPHEN 5-325 MG PO TABS: 1.0000 | ORAL_TABLET | Freq: Three times a day (TID) | ORAL | Status: DC | PRN

## 2018-03-06 MED ORDER — FENTANYL CITRATE (PF) 100 MCG/2ML IJ SOLN
50.0000 ug | Freq: Once | INTRAMUSCULAR | Status: AC
  Administered 2018-03-06: 50 ug via INTRAVENOUS
  Filled 2018-03-06: qty 2

## 2018-03-06 MED ORDER — MAGNESIUM OXIDE 400 (241.3 MG) MG PO TABS
400.0000 mg | ORAL_TABLET | Freq: Every day | ORAL | Status: DC
  Administered 2018-03-07: 400 mg via ORAL
  Filled 2018-03-06: qty 1

## 2018-03-06 MED ORDER — OXYCODONE-ACETAMINOPHEN 5-325 MG PO TABS: 2.0000 | ORAL_TABLET | Freq: Once | ORAL | Status: DC

## 2018-03-06 MED ORDER — ACETAMINOPHEN 325 MG PO TABS
650.0000 mg | ORAL_TABLET | Freq: Four times a day (QID) | ORAL | Status: DC | PRN
  Administered 2018-03-07: 650 mg via ORAL
  Filled 2018-03-06: qty 2

## 2018-03-06 MED ORDER — LATANOPROST 0.005 % OP SOLN
1.0000 [drp] | Freq: Every day | OPHTHALMIC | Status: DC
  Administered 2018-03-07: 1 [drp] via OPHTHALMIC
  Filled 2018-03-06: qty 2.5

## 2018-03-06 MED ORDER — PANTOPRAZOLE SODIUM 40 MG PO TBEC
40.0000 mg | DELAYED_RELEASE_TABLET | Freq: Every day | ORAL | Status: DC
  Administered 2018-03-07: 40 mg via ORAL
  Filled 2018-03-06: qty 1

## 2018-03-06 MED ORDER — OXYCODONE HCL 5 MG PO TABS
5.0000 mg | ORAL_TABLET | Freq: Three times a day (TID) | ORAL | Status: DC | PRN
  Administered 2018-03-07: 5 mg via ORAL
  Filled 2018-03-06: qty 1

## 2018-03-06 MED ORDER — ALLOPURINOL 300 MG PO TABS
300.0000 mg | ORAL_TABLET | Freq: Every day | ORAL | Status: DC
  Administered 2018-03-07: 300 mg via ORAL
  Filled 2018-03-06: qty 3
  Filled 2018-03-06: qty 1

## 2018-03-06 MED ORDER — ONDANSETRON HCL 4 MG PO TABS: 4.0000 mg | ORAL_TABLET | Freq: Four times a day (QID) | ORAL | Status: DC | PRN

## 2018-03-06 MED ORDER — ALBUTEROL SULFATE (2.5 MG/3ML) 0.083% IN NEBU: 3.0000 mL | INHALATION_SOLUTION | RESPIRATORY_TRACT | Status: DC | PRN

## 2018-03-06 MED ORDER — PROTHROMBIN COMPLEX CONC HUMAN 500 UNITS IV KIT
4918.0000 [IU] | PACK | Status: AC
  Administered 2018-03-06: 4918 [IU] via INTRAVENOUS
  Filled 2018-03-06: qty 918

## 2018-03-06 MED ORDER — HYDROCHLOROTHIAZIDE 25 MG PO TABS
25.0000 mg | ORAL_TABLET | Freq: Every day | ORAL | Status: DC
  Administered 2018-03-07: 25 mg via ORAL
  Filled 2018-03-06: qty 1

## 2018-03-06 MED ORDER — TETANUS-DIPHTH-ACELL PERTUSSIS 5-2.5-18.5 LF-MCG/0.5 IM SUSP
0.5000 mL | Freq: Once | INTRAMUSCULAR | Status: AC
  Administered 2018-03-06: 0.5 mL via INTRAMUSCULAR
  Filled 2018-03-06: qty 0.5

## 2018-03-06 MED ORDER — ACETAMINOPHEN 650 MG RE SUPP: 650.0000 mg | Freq: Four times a day (QID) | RECTAL | Status: DC | PRN

## 2018-03-06 MED ORDER — ONDANSETRON HCL 4 MG/2ML IJ SOLN: 4.0000 mg | Freq: Four times a day (QID) | INTRAMUSCULAR | Status: DC | PRN

## 2018-03-06 MED ORDER — INSULIN ASPART 100 UNIT/ML ~~LOC~~ SOLN
0.0000 [IU] | Freq: Three times a day (TID) | SUBCUTANEOUS | Status: DC
  Administered 2018-03-07: 1 [IU] via SUBCUTANEOUS

## 2018-03-06 MED ORDER — BRIMONIDINE TARTRATE 0.15 % OP SOLN
1.0000 [drp] | Freq: Two times a day (BID) | OPHTHALMIC | Status: DC
  Administered 2018-03-07 (×2): 1 [drp] via OPHTHALMIC
  Filled 2018-03-06: qty 5

## 2018-03-06 MED ORDER — ATORVASTATIN CALCIUM 10 MG PO TABS
20.0000 mg | ORAL_TABLET | Freq: Every day | ORAL | Status: DC
  Administered 2018-03-07: 20 mg via ORAL
  Filled 2018-03-06: qty 2

## 2018-03-06 MED ORDER — ADULT MULTIVITAMIN W/MINERALS CH
1.0000 | ORAL_TABLET | Freq: Every day | ORAL | Status: DC
  Administered 2018-03-07: 1 via ORAL
  Filled 2018-03-06: qty 1

## 2018-03-06 MED ORDER — DULOXETINE HCL 30 MG PO CPEP: 30.0000 mg | ORAL_CAPSULE | ORAL | Status: DC

## 2018-03-06 MED ORDER — FLUTICASONE PROPIONATE 50 MCG/ACT NA SUSP
2.0000 | Freq: Every day | NASAL | Status: DC
  Administered 2018-03-07: 2 via NASAL
  Filled 2018-03-06: qty 16

## 2018-03-06 MED ORDER — POTASSIUM GLUCONATE 595 (99 K) MG PO TABS: 595.0000 mg | ORAL_TABLET | Freq: Every day | ORAL | Status: DC

## 2018-03-06 NOTE — H&P (Signed)
History and Physical    Tabari Volkert HBZ:[id number] DOB: 06/25/1932 DOA: 03/06/2018  PCP: Binnie Rail, MD  Patient coming from: Home.  Chief Complaint: Fall.  HPI: Jonathan [name] is a 84 y.o. male with history of paroxysmal atrial fibrillation on Eliquis, hypertension, diabetes mellitus type 2, COPD, sleep apnea, chronic back pain, hypothyroidism, depression was brought to the ER after patient had a fall at home.  Patient states he was moving the his wife's wheelchair when he tripped and fell backwards and hit his head.  Denies losing consciousness.  Has some headache injured area.  ED Course: In the ER CT head shows anterior right parafalcine subdural hematoma enlargement scalp hematoma.  On-call neurosurgeon Dr. Trenton Gammon was consulted with this time advised observation since patient is on Eliquis.  Patient also was given Kcentra for potential reversal of Eliquis.  Patient on exam is nonfocal.  Admitted for further observation will need CT head in the morning.  Review of Systems: As per HPI, rest all negative.   Past Medical History:  Diagnosis Date  . Atrial fibrillation (HCC)    a. paroxysmal, on Eliquis for anticoagulation  . BPH (benign prostatic hyperplasia)   . Bronchitis   . Cataract   . COPD (chronic obstructive pulmonary disease) (HCC)    2 liters O2 HS  . Depression   . Fatty tumor    waste and back  . Fibromyalgia   . GERD (gastroesophageal reflux disease)   . Glaucoma    bilateral eyes  . Gout   . Hereditary and idiopathic peripheral neuropathy 08/28/2015  . Hypertension   . Hypothyroidism   . Hypoxia   . Insomnia   . Memory disorder 08/28/2015  . On home oxygen therapy    at night with cpap  . Osteoarthritis   . Osteoporosis   . Peripheral neuropathy   . Rotator cuff tear, left   . Sleep apnea    uses cpap-add oxygen at night  . Spinal compression fracture (HCC) seventh vertebre  . Transient alteration of awareness 08/28/2015  . Type 2 diabetes  mellitus ([place]) 03/08/2016  . Wears glasses     Past Surgical History:  Procedure Laterality Date  . APPENDECTOMY  age 28  . CARPAL TUNNEL RELEASE Right    early 2000s  . CATARACT EXTRACTION     bilateral  . CHOLECYSTECTOMY  age 45  . EYE SURGERY    . FOOT ARTHRODESIS Right 02/02/2013   Procedure: RIGHT HALLUX METATARSAL PHALANGEAL JOINT ARTHRODESIS ;  Surgeon: Wylene Simmer, MD;  Location: [facility];  Service: Orthopedics;  Laterality: Right;  . INGUINAL HERNIA REPAIR  age 23   rt side  . NASAL CONCHA BULLOSA RESECTION  age 20  . PROSTATE SURGERY    . SHOULDER ARTHROSCOPY W/ ROTATOR CUFF REPAIR Right    early 2000s  . tonsil    . VASECTOMY  age 79     reports that he quit smoking about 42 years ago. His smoking use included cigarettes. He has a 20.00 pack-year smoking history. He has never used smokeless tobacco. He reports that he drinks alcohol. He reports that he does not use drugs.  Allergies  Allergen Reactions  . Other     Other reaction(s): Respiratory distress    Family History  Problem Relation Age of Onset  . Coronary artery disease Brother   . Heart disease Father   . Lung cancer Father   . Kidney cancer Father   . Prostate cancer  Father   . Arthritis Mother   . Lung cancer Mother     Prior to Admission medications   Medication Sig Start Date End Date Taking? Authorizing Provider  acetaminophen (TYLENOL) 500 MG tablet Take 2 tablets (1,000 mg total) by mouth every 8 (eight) hours as needed. Patient taking differently: Take 1,000 mg by mouth 2 (two) times daily.  04/15/17  Yes Burns, Claudina Lick, MD  allopurinol (ZYLOPRIM) 300 MG tablet TAKE 1 TABLET BY MOUTH  EVERY EVENING Patient taking differently: Take 300 mg by mouth daily.  10/28/17  Yes Burns, Claudina Lick, MD  atorvastatin (LIPITOR) 20 MG tablet TAKE 1 TABLET BY MOUTH  DAILY Patient taking differently: Take 20 mg by mouth daily at 6 PM.  10/21/17  Yes Burns, Claudina Lick, MD  Biotin 5000 MCG TABS Take  5,000 mcg by mouth daily.   Yes [provider]  brimonidine (ALPHAGAN) 0.15 % ophthalmic solution Place 1 drop into the left eye 2 (two) times daily.  12/06/14  Yes [provider]  calcium-vitamin D (OSCAL WITH D) 500-200 MG-UNIT tablet Take 1 tablet by mouth daily with breakfast.   Yes [provider]  colchicine (COLCRYS) 0.6 MG tablet Take 0.6 mg by mouth daily as needed (flareups).    Yes [provider]  diltiazem (CARDIZEM CD) 300 MG 24 hr capsule TAKE 1 CAPSULE DAILY BY  MOUTH. Patient taking differently: Take 300 mg by mouth daily.  03/03/18  Yes Lelon Perla, MD  doxazosin (CARDURA) 8 MG tablet TAKE 1 TABLET BY MOUTH  DAILY Patient taking differently: Take 8 mg by mouth daily.  01/25/18  Yes Burns, Claudina Lick, MD  DULoxetine (CYMBALTA) 30 MG capsule TAKE 2 CAPSULES BY MOUTH IN THE MORNING AND 1 CAPSULE  BY MOUTH IN THE EVENING Patient taking differently: Take 30-60 mg by mouth See admin instructions. TAKE 2 CAPSULES BY MOUTH IN THE MORNING AND 1 CAPSULE  BY MOUTH IN THE EVENING 02/16/18  Yes Burns, Claudina Lick, MD  ELIQUIS 5 MG TABS tablet TAKE 1 TABLET BY MOUTH TWO  TIMES DAILY 03/04/18  Yes Lelon Perla, MD  hydrochlorothiazide (HYDRODIURIL) 25 MG tablet TAKE 1 TABLET BY MOUTH  DAILY Patient taking differently: Take 25 mg by mouth daily.  10/12/17  Yes Lelon Perla, MD  latanoprost (XALATAN) 0.005 % ophthalmic solution Place 1 drop into both eyes at bedtime. 05/02/12  Yes Posey Boyer, MD  levalbuterol [facility] HFA) 45 MCG/ACT inhaler Inhale 1-2 puffs into the lungs every 4 (four) hours as needed for wheezing. 07/06/13  Yes Byrum, Rose Fillers, MD  levothyroxine (SYNTHROID, LEVOTHROID) 112 MCG tablet Take 1 tablet (112 mcg total) by mouth daily. 02/16/18  Yes Burns, Claudina Lick, MD  magnesium oxide (MAG-OX) 400 MG tablet Take 400 mg by mouth daily.   Yes [provider]  metFORMIN (GLUCOPHAGE) 500 MG tablet Take 1 tablet (500 mg total) by mouth 2  (two) times daily with a meal. 08/13/17  Yes Burns, Claudina Lick, MD  mometasone (NASONEX) 50 MCG/ACT nasal spray Place 2 sprays into the nose daily. 02/15/18  Yes Burns, Claudina Lick, MD  Multiple Vitamin (MULTIVITAMIN) tablet Take 1 tablet by mouth daily.   Yes [provider]  nitroGLYCERIN (NITROSTAT) 0.4 MG SL tablet Place 1 tablet (0.4 mg total) under the tongue every 5 (five) minutes as needed for chest pain. 12/13/14  Yes Barrett, Evelene Croon, PA-C  omeprazole (PRILOSEC) 20 MG capsule TAKE 1 CAPSULE BY MOUTH  DAILY  Patient taking differently: Take 20 mg by mouth daily.  02/17/18  Yes Burns, Claudina Lick, MD  oxyCODONE-acetaminophen (PERCOCET) 10-325 MG tablet Take 1 tablet by mouth every 8 (eight) hours as needed for pain.   Yes [provider]  potassium gluconate (EQL POTASSIUM GLUCONATE) 595 (99 K) MG TABS tablet Take 595 mg by mouth daily.   Yes [provider]  tadalafil (CIALIS) 20 MG tablet Take 1 tablet (20 mg total) by mouth daily as needed. For erectile dysfunction 12/08/16  Yes Burns, Claudina Lick, MD  Tiotropium Bromide-Olodaterol (STIOLTO RESPIMAT) 2.5-2.5 MCG/ACT AERS Inhale 2 puffs into the lungs daily. 01/04/18  Yes Collene Gobble, MD  tiZANidine (ZANAFLEX) 4 MG capsule Take 1 capsule (4 mg total) by mouth at bedtime. 10/14/17  Yes Burns, Claudina Lick, MD  vitamin C (ASCORBIC ACID) 500 MG tablet Take 1,000 mg by mouth daily.    Yes [provider]  VITAMIN D, CHOLECALCIFEROL, PO Take 50 mcg by mouth daily.   Yes [provider]  zolpidem (AMBIEN) 10 MG tablet Take 1 tablet (10 mg total) by mouth at bedtime as needed for sleep. 01/28/18 03/07/27 Yes Burns, Claudina Lick, MD  blood glucose meter kit and supplies KIT Dispense based on patient and insurance preference. Use up to four times daily as directed. (FOR E11.9). 10/14/17   Binnie Rail, MD  mupirocin cream (BACTROBAN) 2 % Apply 1 application topically 2 (two) times daily. Patient not taking: Reported on 03/06/2018 01/12/17    Wendie Agreste, MD    Physical Exam: Vitals:   03/06/18 2215 03/06/18 2230 03/06/18 2245 03/06/18 2300  BP: (!) 145/53 118/80 140/61 134/69  Pulse: 79 85 74 67  Resp: 15 (!) _0 Temp:      TempSrc:      SpO2: 96% 96% 97% 96%      Constitutional: Moderately built and nourished. Vitals:   03/06/18 2215 03/06/18 2230 03/06/18 2245 03/06/18 2300  BP: (!) 145/53 118/80 140/61 134/69  Pulse: 79 85 74 67  Resp: 15 (!) _1 Temp:      TempSrc:      SpO2: 96% 96% 97% 96%   Eyes: Anicteric no pallor. ENMT: No discharge from the ears eyes nose or mouth.  Scalp hematoma. Neck: No mass felt.  No neck rigidity. Respiratory: No rhonchi or crepitation. Cardiovascular: S1-S2 heard. Abdomen: Soft nontender bowel sounds present. Musculoskeletal: No edema.  Has pain in the both hands from arthritis. Skin: Scalp hematoma. Neurologic: Alert awake oriented to time place and person.  Moves all extremities. Psychiatric: Appears normal per normal affect.   Labs on Admission: I have personally reviewed following labs and imaging studies  CBC: Recent Labs  Lab 03/02/18 1440 03/06/18 2220  WBC 6.5 7.2  NEUTROABS 3.0 4.3  HGB 13.9 14.3  14.6  HCT 41.6 44.4  43.0  MCV 89.8 92.9  PLT 118.0* 505*   Basic Metabolic Panel: Recent Labs  Lab 03/06/18 2220  NA 137  139  K 3.4*  4.7  CL 102  103  CO2 27  GLUCOSE 180*  176*  BUN 17  25*  CREATININE 1.03  1.00  CALCIUM 9.1   GFR: Estimated Creatinine Clearance: 58.9 mL/min (by C-G formula based on SCr of 1.03 mg/dL). Liver Function Tests: No results for input(s): AST, ALT, ALKPHOS, BILITOT, PROT, ALBUMIN in the last 168 hours. No results for input(s): LIPASE, AMYLASE in the last 168 hours. No results for input(s):  AMMONIA in the last 168 hours. Coagulation Profile: Recent Labs  Lab 03/06/18 2220  INR 1.12   Cardiac Enzymes: No results for input(s): CKTOTAL, CKMB, CKMBINDEX, TROPONINI in the last 168  hours. BNP (last 3 results) No results for input(s): PROBNP in the last 8760 hours. HbA1C: No results for input(s): HGBA1C in the last 72 hours. CBG: No results for input(s): GLUCAP in the last 168 hours. Lipid Profile: No results for input(s): CHOL, HDL, LDLCALC, TRIG, CHOLHDL, LDLDIRECT in the last 72 hours. Thyroid Function Tests: No results for input(s): TSH, T4TOTAL, FREET4, T3FREE, THYROIDAB in the last 72 hours. Anemia Panel: No results for input(s): VITAMINB12, FOLATE, FERRITIN, TIBC, IRON, RETICCTPCT in the last 72 hours. Urine analysis:    Component Value Date/Time   COLORURINE YELLOW 03/02/2018 1440   APPEARANCEUR CLEAR 03/02/2018 1440   LABSPEC 1.020 03/02/2018 1440   PHURINE 6.0 03/02/2018 1440   GLUCOSEU NEGATIVE 03/02/2018 1440   HGBUR NEGATIVE 03/02/2018 1440   BILIRUBINUR NEGATIVE 03/02/2018 1440   BILIRUBINUR neg 11/23/2012 0942   KETONESUR NEGATIVE 03/02/2018 1440   PROTEINUR 100 (A) 03/07/2016 2015   UROBILINOGEN 0.2 03/02/2018 1440   NITRITE NEGATIVE 03/02/2018 1440   LEUKOCYTESUR NEGATIVE 03/02/2018 1440   Sepsis Labs: _0 (procalcitonin:4,lacticidven:4) )No results found for this or any previous visit (from the past 240 hour(s)).   Radiological Exams on Admission: Dg Shoulder Right  Result Date: 03/06/2018 CLINICAL DATA:  Fall EXAM: RIGHT SHOULDER - 2+ VIEW COMPARISON:  None. FINDINGS: Postoperative changes in the region of the rotator cuff insertion with anchor noted in the humeral head. Degenerative changes most pronounced in the [facility] joint. No acute bony abnormality. Specifically, no fracture, subluxation, or dislocation. IMPRESSION: No acute bony abnormality. Electronically Signed   By: Rolm Baptise M.D.   On: 03/06/2018 21:22   Ct Head Wo Contrast  Result Date: 03/06/2018 CLINICAL DATA:  Fall with head injury. Neck pain. Anticoagulated on Eliquis. EXAM: CT HEAD WITHOUT CONTRAST CT CERVICAL SPINE WITHOUT CONTRAST TECHNIQUE: Multidetector CT  imaging of the head and cervical spine was performed following the standard protocol without intravenous contrast. Multiplanar CT image reconstructions of the cervical spine were also generated. COMPARISON:  12/23/2016 brain MRI. FINDINGS: CT HEAD FINDINGS Brain: Anterior right parafalcine subdural hematoma measuring 6 mm thickness. No intra-axial hemorrhage. No midline shift. No significant mass-effect. No CT evidence of acute infarction. Generalized cerebral volume loss. Nonspecific mild subcortical and periventricular white matter hypodensity, most in keeping with chronic small vessel ischemic change. No ventriculomegaly. Vascular: No acute abnormality. Skull: No evidence of calvarial fracture. Sinuses/Orbits: The visualized paranasal sinuses are essentially clear. Other: Large midline posterior scalp hematoma. The mastoid air cells are unopacified. CT CERVICAL SPINE FINDINGS Alignment: Straightening of the cervical spine. No facet subluxation. Dens is well positioned between the lateral masses of C1. Minimal 2 mm anterolisthesis at C2-3 and C3-4. Skull base and vertebrae: No acute fracture. No primary bone lesion or focal pathologic process. Soft tissues and spinal canal: No prevertebral edema. No visible canal hematoma. Disc levels: Marked multilevel degenerative disc disease in the mid to lower cervical spine. Advanced bilateral facet arthropathy. Moderate degenerative foraminal stenosis on the right at C3-4. Upper chest: No acute abnormality. Other: Visualized mastoid air cells appear clear. No discrete thyroid nodules. No pathologically enlarged cervical nodes. IMPRESSION: CT HEAD: 1. Anterior right parafalcine subdural hematoma. No significant mass effect. No midline shift. 2. Large midline posterior scalp hematoma. 3. Generalized cerebral volume loss with mild chronic small vessel ischemic changes in  the cerebral white matter. CT CERVICAL SPINE: 1. No cervical spine fracture or facet subluxation. 2.  Moderate to severe multilevel degenerative changes in the cervical spine as detailed. Critical Value/emergent results were called by telephone at the time of interpretation on 03/06/2018 at 9:48 pm to Dr. Threasa Beards BELFI , who verbally acknowledged these results. Electronically Signed   By: Ilona Sorrel M.D.   On: 03/06/2018 21:50   Ct Cervical Spine Wo Contrast  Result Date: 03/06/2018 CLINICAL DATA:  Fall with head injury. Neck pain. Anticoagulated on Eliquis. EXAM: CT HEAD WITHOUT CONTRAST CT CERVICAL SPINE WITHOUT CONTRAST TECHNIQUE: Multidetector CT imaging of the head and cervical spine was performed following the standard protocol without intravenous contrast. Multiplanar CT image reconstructions of the cervical spine were also generated. COMPARISON:  12/23/2016 brain MRI. FINDINGS: CT HEAD FINDINGS Brain: Anterior right parafalcine subdural hematoma measuring 6 mm thickness. No intra-axial hemorrhage. No midline shift. No significant mass-effect. No CT evidence of acute infarction. Generalized cerebral volume loss. Nonspecific mild subcortical and periventricular white matter hypodensity, most in keeping with chronic small vessel ischemic change. No ventriculomegaly. Vascular: No acute abnormality. Skull: No evidence of calvarial fracture. Sinuses/Orbits: The visualized paranasal sinuses are essentially clear. Other: Large midline posterior scalp hematoma. The mastoid air cells are unopacified. CT CERVICAL SPINE FINDINGS Alignment: Straightening of the cervical spine. No facet subluxation. Dens is well positioned between the lateral masses of C1. Minimal 2 mm anterolisthesis at C2-3 and C3-4. Skull base and vertebrae: No acute fracture. No primary bone lesion or focal pathologic process. Soft tissues and spinal canal: No prevertebral edema. No visible canal hematoma. Disc levels: Marked multilevel degenerative disc disease in the mid to lower cervical spine. Advanced bilateral facet arthropathy. Moderate  degenerative foraminal stenosis on the right at C3-4. Upper chest: No acute abnormality. Other: Visualized mastoid air cells appear clear. No discrete thyroid nodules. No pathologically enlarged cervical nodes. IMPRESSION: CT HEAD: 1. Anterior right parafalcine subdural hematoma. No significant mass effect. No midline shift. 2. Large midline posterior scalp hematoma. 3. Generalized cerebral volume loss with mild chronic small vessel ischemic changes in the cerebral white matter. CT CERVICAL SPINE: 1. No cervical spine fracture or facet subluxation. 2. Moderate to severe multilevel degenerative changes in the cervical spine as detailed. Critical Value/emergent results were called by telephone at the time of interpretation on 03/06/2018 at 9:48 pm to Dr. Threasa Beards BELFI , who verbally acknowledged these results. Electronically Signed   By: Ilona Sorrel M.D.   On: 03/06/2018 21:50   Ct Thoracic Spine Wo Contrast  Result Date: 03/06/2018 CLINICAL DATA:  [place] [address] backwards pushing wheelchair on ramp. History of thoracic spine fracture. EXAM: CT THORACIC SPINE WITHOUT CONTRAST TECHNIQUE: Multidetector CT images of the thoracic were obtained using the standard protocol without intravenous contrast. COMPARISON:  CT chest May 22, 2016 FINDINGS: ALIGNMENT: Maintained thoracic kyphosis. No malalignment. VERTEBRAE: Old moderate T8 compression fracture. Old mild T5 superior endplate compression fracture. Remaining thoracic vertebral bodies intact. Scattered old Schmorl's nodes. Osteopenia without destructive bony lesions. Intervertebral disc heights preserved with mild ventral endplate mid to lower thoracic spine. Stable bilateral 8th rib bone islands. PARASPINAL AND OTHER SOFT TISSUES: Nonacute. Moderate calcific atherosclerosis descending aorta DISC LEVELS: No disc bulge or canal stenosis. Mild T8-9 through T9-10 neural foraminal narrowing. IMPRESSION: 1. No acute fracture or malalignment. 2. Old moderate T8 and mild T5  compression fractures. Electronically Signed   By: Elon Alas M.D.   On: 03/06/2018 21:59   Dg Shoulder Left  Result Date: 03/06/2018 CLINICAL DATA:  Fall, left shoulder pain EXAM: LEFT SHOULDER - 2+ VIEW COMPARISON:  None. FINDINGS: Degenerative changes with joint space loss and spurring in the [facility] and glenohumeral joints. Subacromial joint space loss, likely related to chronic rotator cuff disease. No acute bony abnormality. Specifically, no fracture, subluxation, or dislocation. IMPRESSION: Degenerative changes.  No acute bony abnormality. Electronically Signed   By: Rolm Baptise M.D.   On: 03/06/2018 21:24   Dg Hand Complete Left  Result Date: 03/06/2018 CLINICAL DATA:  Fall.  Bilateral wrist pain. EXAM: LEFT HAND - COMPLETE 3+ VIEW COMPARISON:  None. FINDINGS: Degenerative changes at the 1st carpometacarpal joint and diffusely throughout the IP joints. No acute bony abnormality. Specifically, no fracture, subluxation, or dislocation. IMPRESSION: No acute bony abnormality. Electronically Signed   By: Rolm Baptise M.D.   On: 03/06/2018 21:23   Dg Hand Complete Right  Result Date: 03/06/2018 CLINICAL DATA:  84 year old male with fall and right hand pain. EXAM: RIGHT HAND - COMPLETE 3+ VIEW COMPARISON:  Bilateral and radiograph dated 05/23/2012 FINDINGS: There is no acute fracture or dislocation. The bones are osteopenic. There is severe arthritic changes of the base of the thumb similar to prior radiograph. The soft tissues appear unremarkable. IMPRESSION: 1. No acute fracture or dislocation. 2. 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 Electronically Signed   By: Anner Crete M.D.   On: 03/06/2018 21:25     Assessment/Plan Principal Problem:   Intracranial hemorrhage (HCC) Active Problems:   COPD (chronic obstructive pulmonary disease) (HCC)   Sleep apnea, obstructive   Essential hypertension   Hereditary and idiopathic peripheral neuropathy   Memory disorder    Hypothyroidism   Gout   Type 2 diabetes mellitus (HCC)   PAF (paroxysmal atrial fibrillation) ([place])    1. Intracranial bleed and scalp hematoma status post mechanical fall on Eliquis -patient's Eliquis was on hold and was given Kcentra.  Appreciate neurosurgery consult recommendation.  Repeat CTA in the morning.  Closely observe. 2. Paroxysmal atrial fibrillation -holding Eliquis due to intracranial bleed.  Restart when okay with neurosurgery.  On Cardizem. 3. Hypertension on Cardizem hydrochlorothiazide and Cardura. 4. Diabetes mellitus type 2 we will keep patient on sliding scale coverage and hold metformin for now. 5. Sleep apnea on CPAP. 6. Hypothyroidism on Synthroid. 7. Chronic pain on pain relief medications which will be continued. 8. COPD not actively wheezing.   DVT prophylaxis: SCDs. Code Status: Full code. Family Communication: Discussed with patient. Disposition Plan: Home. Consults called: Neurosurgery. Admission status: Observation.   Rise Patience MD [facility] Pager [phone].  If 7PM-7AM, please contact night-coverage [email or web address] Password [facility]  03/06/2018, 11:33 PM

## 2018-03-06 NOTE — ED Notes (Signed)
Admitting Md at bedside

## 2018-03-06 NOTE — ED Notes (Signed)
Neurosurgeon at [facility]

## 2018-03-06 NOTE — ED Notes (Signed)
Neuro surgery at bedside.

## 2018-03-06 NOTE — ED Triage Notes (Signed)
Pt brought in by [facility] from home for a fall- pt was trying to bring his wife in to the ED for an infection in her foot when he was wheeling her backwards down a ramp pt states "the wheelchair got the best of me" and he fell backwards hitting his head. Pt is on eliquis, has hx of afib. Pt states he caught himself with his wrists- c/o bilateral wrist pain. Pt also c/o neck and shoulder pain, states he cannot tell if it is his chronic pain or new pain from the fall. Pt denies LOC, is A+Ox4 and in NAD.

## 2018-03-06 NOTE — ED Notes (Signed)
Patient transported to XR. 

## 2018-03-06 NOTE — Consult Note (Signed)
Jonathan [name] is an 84 y.o. male.   Chief Complaint: Traumatic brain injury HPI: 84 year old male status post mechanical fall.  No loss of consciousness.  Patient complains of mild headache.  No other neurologic findings.  Question complicated by anticoagulation on Eliquis.  Reversal agent given.  Past Medical History:  Diagnosis Date  . Atrial fibrillation (HCC)    a. paroxysmal, on Eliquis for anticoagulation  . BPH (benign prostatic hyperplasia)   . Bronchitis   . Cataract   . COPD (chronic obstructive pulmonary disease) (HCC)    2 liters O2 HS  . Depression   . Fatty tumor    waste and back  . Fibromyalgia   . GERD (gastroesophageal reflux disease)   . Glaucoma    bilateral eyes  . Gout   . Hereditary and idiopathic peripheral neuropathy 08/28/2015  . Hypertension   . Hypothyroidism   . Hypoxia   . Insomnia   . Memory disorder 08/28/2015  . On home oxygen therapy    at night with cpap  . Osteoarthritis   . Osteoporosis   . Peripheral neuropathy   . Rotator cuff tear, left   . Sleep apnea    uses cpap-add oxygen at night  . Spinal compression fracture (HCC) seventh vertebre  . Transient alteration of awareness 08/28/2015  . Type 2 diabetes mellitus ([place]) 03/08/2016  . Wears glasses     Past Surgical History:  Procedure Laterality Date  . APPENDECTOMY  age 74  . CARPAL TUNNEL RELEASE Right    early 2000s  . CATARACT EXTRACTION     bilateral  . CHOLECYSTECTOMY  age 47  . EYE SURGERY    . FOOT ARTHRODESIS Right 02/02/2013   Procedure: RIGHT HALLUX METATARSAL PHALANGEAL JOINT ARTHRODESIS ;  Surgeon: Wylene Simmer, MD;  Location: [place];  Service: Orthopedics;  Laterality: Right;  . INGUINAL HERNIA REPAIR  age 35   rt side  . NASAL CONCHA BULLOSA RESECTION  age 12  . PROSTATE SURGERY    . SHOULDER ARTHROSCOPY W/ ROTATOR CUFF REPAIR Right    early 2000s  . tonsil    . VASECTOMY  age 36    Family History  Problem Relation Age of Onset  .  Coronary artery disease Brother   . Heart disease Father   . Lung cancer Father   . Kidney cancer Father   . Prostate cancer Father   . Arthritis Mother   . Lung cancer Mother    Social History:  reports that he quit smoking about 42 years ago. His smoking use included cigarettes. He has a 20.00 pack-year smoking history. He has never used smokeless tobacco. He reports that he drinks alcohol. He reports that he does not use drugs.  Allergies:  Allergies  Allergen Reactions  . Other     Other reaction(s): Respiratory distress     (Not in a hospital admission)  No results found for this or any previous visit (from the past 48 hour(s)). Dg Shoulder Right  Result Date: 03/06/2018 CLINICAL DATA:  Fall EXAM: RIGHT SHOULDER - 2+ VIEW COMPARISON:  None. FINDINGS: Postoperative changes in the region of the rotator cuff insertion with anchor noted in the humeral head. Degenerative changes most pronounced in the [facility] joint. No acute bony abnormality. Specifically, no fracture, subluxation, or dislocation. IMPRESSION: No acute bony abnormality. Electronically Signed   By: Rolm Baptise M.D.   On: 03/06/2018 21:22   Ct Head Wo Contrast  Result Date: 03/06/2018 CLINICAL DATA:  Fall with head injury. Neck pain. Anticoagulated on Eliquis. EXAM: CT HEAD WITHOUT CONTRAST CT CERVICAL SPINE WITHOUT CONTRAST TECHNIQUE: Multidetector CT imaging of the head and cervical spine was performed following the standard protocol without intravenous contrast. Multiplanar CT image reconstructions of the cervical spine were also generated. COMPARISON:  12/23/2016 brain MRI. FINDINGS: CT HEAD FINDINGS Brain: Anterior right parafalcine subdural hematoma measuring 6 mm thickness. No intra-axial hemorrhage. No midline shift. No significant mass-effect. No CT evidence of acute infarction. Generalized cerebral volume loss. Nonspecific mild subcortical and periventricular white matter hypodensity, most in keeping with chronic small  vessel ischemic change. No ventriculomegaly. Vascular: No acute abnormality. Skull: No evidence of calvarial fracture. Sinuses/Orbits: The visualized paranasal sinuses are essentially clear. Other: Large midline posterior scalp hematoma. The mastoid air cells are unopacified. CT CERVICAL SPINE FINDINGS Alignment: Straightening of the cervical spine. No facet subluxation. Dens is well positioned between the lateral masses of C1. Minimal 2 mm anterolisthesis at C2-3 and C3-4. Skull base and vertebrae: No acute fracture. No primary bone lesion or focal pathologic process. Soft tissues and spinal canal: No prevertebral edema. No visible canal hematoma. Disc levels: Marked multilevel degenerative disc disease in the mid to lower cervical spine. Advanced bilateral facet arthropathy. Moderate degenerative foraminal stenosis on the right at C3-4. Upper chest: No acute abnormality. Other: Visualized mastoid air cells appear clear. No discrete thyroid nodules. No pathologically enlarged cervical nodes. IMPRESSION: CT HEAD: 1. Anterior right parafalcine subdural hematoma. No significant mass effect. No midline shift. 2. Large midline posterior scalp hematoma. 3. Generalized cerebral volume loss with mild chronic small vessel ischemic changes in the cerebral white matter. CT CERVICAL SPINE: 1. No cervical spine fracture or facet subluxation. 2. Moderate to severe multilevel degenerative changes in the cervical spine as detailed. Critical Value/emergent results were called by telephone at the time of interpretation on 03/06/2018 at 9:48 pm to Dr. Threasa Beards BELFI , who verbally acknowledged these results. Electronically Signed   By: Ilona Sorrel M.D.   On: 03/06/2018 21:50   Ct Cervical Spine Wo Contrast  Result Date: 03/06/2018 CLINICAL DATA:  Fall with head injury. Neck pain. Anticoagulated on Eliquis. EXAM: CT HEAD WITHOUT CONTRAST CT CERVICAL SPINE WITHOUT CONTRAST TECHNIQUE: Multidetector CT imaging of the head and cervical  spine was performed following the standard protocol without intravenous contrast. Multiplanar CT image reconstructions of the cervical spine were also generated. COMPARISON:  12/23/2016 brain MRI. FINDINGS: CT HEAD FINDINGS Brain: Anterior right parafalcine subdural hematoma measuring 6 mm thickness. No intra-axial hemorrhage. No midline shift. No significant mass-effect. No CT evidence of acute infarction. Generalized cerebral volume loss. Nonspecific mild subcortical and periventricular white matter hypodensity, most in keeping with chronic small vessel ischemic change. No ventriculomegaly. Vascular: No acute abnormality. Skull: No evidence of calvarial fracture. Sinuses/Orbits: The visualized paranasal sinuses are essentially clear. Other: Large midline posterior scalp hematoma. The mastoid air cells are unopacified. CT CERVICAL SPINE FINDINGS Alignment: Straightening of the cervical spine. No facet subluxation. Dens is well positioned between the lateral masses of C1. Minimal 2 mm anterolisthesis at C2-3 and C3-4. Skull base and vertebrae: No acute fracture. No primary bone lesion or focal pathologic process. Soft tissues and spinal canal: No prevertebral edema. No visible canal hematoma. Disc levels: Marked multilevel degenerative disc disease in the mid to lower cervical spine. Advanced bilateral facet arthropathy. Moderate degenerative foraminal stenosis on the right at C3-4. Upper chest: No acute abnormality. Other: Visualized mastoid air cells appear clear. No discrete thyroid nodules. No  pathologically enlarged cervical nodes. IMPRESSION: CT HEAD: 1. Anterior right parafalcine subdural hematoma. No significant mass effect. No midline shift. 2. Large midline posterior scalp hematoma. 3. Generalized cerebral volume loss with mild chronic small vessel ischemic changes in the cerebral white matter. CT CERVICAL SPINE: 1. No cervical spine fracture or facet subluxation. 2. Moderate to severe multilevel  degenerative changes in the cervical spine as detailed. Critical Value/emergent results were called by telephone at the time of interpretation on 03/06/2018 at 9:48 pm to Dr. Threasa Beards BELFI , who verbally acknowledged these results. Electronically Signed   By: Ilona Sorrel M.D.   On: 03/06/2018 21:50   Ct Thoracic Spine Wo Contrast  Result Date: 03/06/2018 CLINICAL DATA:  [address] backwards pushing wheelchair on ramp. History of thoracic spine fracture. EXAM: CT THORACIC SPINE WITHOUT CONTRAST TECHNIQUE: Multidetector CT images of the thoracic were obtained using the standard protocol without intravenous contrast. COMPARISON:  CT chest May 22, 2016 FINDINGS: ALIGNMENT: Maintained thoracic kyphosis. No malalignment. VERTEBRAE: Old moderate T8 compression fracture. Old mild T5 superior endplate compression fracture. Remaining thoracic vertebral bodies intact. Scattered old Schmorl's nodes. Osteopenia without destructive bony lesions. Intervertebral disc heights preserved with mild ventral endplate mid to lower thoracic spine. Stable bilateral 8th rib bone islands. PARASPINAL AND OTHER SOFT TISSUES: Nonacute. Moderate calcific atherosclerosis descending aorta DISC LEVELS: No disc bulge or canal stenosis. Mild T8-9 through T9-10 neural foraminal narrowing. IMPRESSION: 1. No acute fracture or malalignment. 2. Old moderate T8 and mild T5 compression fractures. Electronically Signed   By: Elon Alas M.D.   On: 03/06/2018 21:59   Dg Shoulder Left  Result Date: 03/06/2018 CLINICAL DATA:  Fall, left shoulder pain EXAM: LEFT SHOULDER - 2+ VIEW COMPARISON:  None. FINDINGS: Degenerative changes with joint space loss and spurring in the [facility] and glenohumeral joints. Subacromial joint space loss, likely related to chronic rotator cuff disease. No acute bony abnormality. Specifically, no fracture, subluxation, or dislocation. IMPRESSION: Degenerative changes.  No acute bony abnormality. Electronically Signed   By: Rolm Baptise M.D.   On: 03/06/2018 21:24   Dg Hand Complete Left  Result Date: 03/06/2018 CLINICAL DATA:  Fall.  Bilateral wrist pain. EXAM: LEFT HAND - COMPLETE 3+ VIEW COMPARISON:  None. FINDINGS: Degenerative changes at the 1st carpometacarpal joint and diffusely throughout the IP joints. No acute bony abnormality. Specifically, no fracture, subluxation, or dislocation. IMPRESSION: No acute bony abnormality. Electronically Signed   By: Rolm Baptise M.D.   On: 03/06/2018 21:23   Dg Hand Complete Right  Result Date: 03/06/2018 CLINICAL DATA:  84 year old male with fall and right hand pain. EXAM: RIGHT HAND - COMPLETE 3+ VIEW COMPARISON:  Bilateral and radiograph dated 05/23/2012 FINDINGS: There is no acute fracture or dislocation. The bones are osteopenic. There is severe arthritic changes of the base of the thumb similar to prior radiograph. The soft tissues appear unremarkable. IMPRESSION: 1. No acute fracture or dislocation. 2. 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 Electronically Signed   By: Anner Crete M.D.   On: 03/06/2018 21:25    Pertinent items noted in HPI and remainder of comprehensive ROS otherwise negative.  Blood pressure (!) 141/59, pulse 77, temperature 98.3 F (36.8 C), temperature source Oral, resp. rate 14, SpO2 98 %.  Awake and alert.  Oriented and reasonably appropriate.  Speech fluent.  Judgment insight intact.  Motor 5/5 bilaterally.  No pronator drift.  Sensory exam nonfocal.  Examination head ears eyes and throat demonstrates evidence of a  significant occipital scalp contusion with subgaleal hematoma.  No evidence of laceration.  No evidence of bony abnormality.  Oropharynx nasopharynx and external auditory canals clear.  Chest and abdomen benign. Assessment/Plan Status post fall with small parafalcine subdural hematoma.  Situation complicated by Eliquis.  Continue reversal agent.  Follow-up head CT scan in morning.  Patient may be mobilized ad lib. very low  likelihood of progression of hemorrhage.  Jonathan [name] 03/06/2018, 10:11 PM

## 2018-03-06 NOTE — ED Notes (Addendum)
Dr.Beaver with [facility] is at bedside who is patient's daughter; Patient has given verbal permission to reveal any medical information to Dr.Beaver; Dr. Modena Nunnery took a look at CT scan results while in room with patient being aware-Monique,RN

## 2018-03-06 NOTE — ED Provider Notes (Signed)
[facility] EMERGENCY DEPARTMENT Provider Note   CSN: [id number] Arrival date & time: 03/06/18  1952     History   Chief Complaint Chief Complaint  Patient presents with  . Fall    HPI Jonathan [name] is a 84 y.o. male.  Patient is a 84 year old male who presents after a fall.  He has a history of atrial fibrillation on Eliquis, COPD, GERD, hypertension, diabetes.  He was wheeling his wife down a ramp in a wheelchair and he states that he lost control and fell over backwards.  He hit his head on the concrete.  He has an abrasion and hematoma in the posterior head.  He complains of a headache.  He denies any nausea or vomiting.  No loss of consciousness.  He has some pain in his neck and upper back.  He also is sore in both of his hands where he caught himself in both of his shoulders.  He denies any rib pain.  No shortness of breath.  No abdominal pain.  No injuries to his lower extremities.  He was able to ambulate after the incident.  He is unsure when his last tetanus shot was.     Past Medical History:  Diagnosis Date  . Atrial fibrillation (HCC)    a. paroxysmal, on Eliquis for anticoagulation  . BPH (benign prostatic hyperplasia)   . Bronchitis   . Cataract   . COPD (chronic obstructive pulmonary disease) (HCC)    2 liters O2 HS  . Depression   . Fatty tumor    waste and back  . Fibromyalgia   . GERD (gastroesophageal reflux disease)   . Glaucoma    bilateral eyes  . Gout   . Hereditary and idiopathic peripheral neuropathy 08/28/2015  . Hypertension   . Hypothyroidism   . Hypoxia   . Insomnia   . Memory disorder 08/28/2015  . On home oxygen therapy    at night with cpap  . Osteoarthritis   . Osteoporosis   . Peripheral neuropathy   . Rotator cuff tear, left   . Sleep apnea    uses cpap-add oxygen at night  . Spinal compression fracture (HCC) seventh vertebre  . Transient alteration of awareness 08/28/2015  . Type 2 diabetes mellitus ([place])  03/08/2016  . Wears glasses     Patient Active Problem List   Diagnosis Date Noted  . Frequent bowel movements 02/15/2018  . Hair loss 02/15/2018  . Skin abnormalities 01/26/2018  . Chronic anticoagulation 12/21/2017  . Polypharmacy 12/21/2017  . Mid back pain on left side 08/10/2017  . Panic attack 08/09/2017  . COPD with acute exacerbation ([place]) 07/23/2017  . Spinal compression fracture ([place])   . Sleep apnea   . Rotator cuff tear, left   . Osteoarthritis   . On home oxygen therapy   . Hypoxia   . Fibromyalgia   . Fatty tumor   . Cataract   . BPH (benign prostatic hyperplasia)   . PAF (paroxysmal atrial fibrillation) ([place])   . Insomnia 03/10/2017  . Chronic pain, legs and back 03/10/2017  . Hyperlipidemia 03/10/2017  . Obesity (BMI 30.0-34.9) 12/08/2016  . Depression 09/07/2016  . Osteoporosis 09/07/2016  . Hypothyroidism 03/08/2016  . GERD (gastroesophageal reflux disease) 03/08/2016  . Gout 03/08/2016  . Glaucoma 03/08/2016  . Type 2 diabetes mellitus ([place]) 03/08/2016  . Hereditary and idiopathic peripheral neuropathy 08/28/2015  . Memory disorder 08/28/2015  . Fatigue 01/08/2015  . Bruit 08/15/2013  . Essential  hypertension 12/04/2011  . Allergic rhinitis, seasonal 12/04/2011  . Sleep apnea, obstructive 08/25/2011  . COPD (chronic obstructive pulmonary disease) ([place]) 07/18/2011    Past Surgical History:  Procedure Laterality Date  . APPENDECTOMY  age 33  . CARPAL TUNNEL RELEASE Right    early 2000s  . CATARACT EXTRACTION     bilateral  . CHOLECYSTECTOMY  age 68  . EYE SURGERY    . FOOT ARTHRODESIS Right 02/02/2013   Procedure: RIGHT HALLUX METATARSAL PHALANGEAL JOINT ARTHRODESIS ;  Surgeon: Wylene Simmer, MD;  Location: [facility];  Service: Orthopedics;  Laterality: Right;  . INGUINAL HERNIA REPAIR  age 18   rt side  . NASAL CONCHA BULLOSA RESECTION  age 35  . PROSTATE SURGERY    . SHOULDER ARTHROSCOPY W/ ROTATOR CUFF REPAIR Right    early  2000s  . tonsil    . VASECTOMY  age 47        Home Medications    Prior to Admission medications   Medication Sig Start Date End Date Taking? Authorizing Provider  acetaminophen (TYLENOL) 500 MG tablet Take 2 tablets (1,000 mg total) by mouth every 8 (eight) hours as needed. 04/15/17   Binnie Rail, MD  allopurinol (ZYLOPRIM) 300 MG tablet TAKE 1 TABLET BY MOUTH  EVERY EVENING 10/28/17   Burns, Claudina Lick, MD  atorvastatin (LIPITOR) 20 MG tablet TAKE 1 TABLET BY MOUTH  DAILY 10/21/17   Binnie Rail, MD  Biotin 5000 MCG TABS Take 5,000 mcg by mouth daily.    [provider]  blood glucose meter kit and supplies KIT Dispense based on patient and insurance preference. Use up to four times daily as directed. (FOR E11.9). 10/14/17   Binnie Rail, MD  brimonidine (ALPHAGAN) 0.15 % ophthalmic solution Place 1 drop into the left eye 2 (two) times daily.  12/06/14   [provider]  calcium-vitamin D (OSCAL WITH D) 500-200 MG-UNIT tablet Take 1 tablet by mouth daily with breakfast.    [provider]  clobetasol (TEMOVATE) 0.05 % external solution Apply 1 application topically 2 (two) times daily.    [provider]  colchicine (COLCRYS) 0.6 MG tablet Take 0.6 mg by mouth daily as needed (flareups).     [provider]  diltiazem (CARDIZEM CD) 300 MG 24 hr capsule TAKE 1 CAPSULE DAILY BY  MOUTH. 03/03/18   Lelon Perla, MD  doxazosin (CARDURA) 8 MG tablet TAKE 1 TABLET BY MOUTH  DAILY 01/25/18   Burns, Claudina Lick, MD  DULoxetine (CYMBALTA) 30 MG capsule TAKE 2 CAPSULES BY MOUTH IN THE MORNING AND 1 CAPSULE  BY MOUTH IN THE EVENING 02/16/18   Burns, Claudina Lick, MD  ELIQUIS 5 MG TABS tablet TAKE 1 TABLET BY MOUTH TWO  TIMES DAILY 03/04/18   Lelon Perla, MD  hydrochlorothiazide (HYDRODIURIL) 25 MG tablet TAKE 1 TABLET BY MOUTH  DAILY 10/12/17   Lelon Perla, MD  latanoprost (XALATAN) 0.005 % ophthalmic solution Place 1 drop into both eyes at bedtime.  05/02/12   Posey Boyer, MD  levalbuterol [facility] HFA) 45 MCG/ACT inhaler Inhale 1-2 puffs into the lungs every 4 (four) hours as needed for wheezing. 07/06/13   Collene Gobble, MD  levothyroxine (SYNTHROID, LEVOTHROID) 112 MCG tablet Take 1 tablet (112 mcg total) by mouth daily. 02/16/18   Binnie Rail, MD  magnesium oxide (MAG-OX) 400 MG tablet Take 400 mg by mouth daily.    [provider]  metFORMIN (GLUCOPHAGE) 500 MG tablet Take 1 tablet (500 mg total) by mouth 2 (two) times daily with a meal. 08/13/17   Burns, Claudina Lick, MD  mometasone (NASONEX) 50 MCG/ACT nasal spray Place 2 sprays into the nose daily. 02/15/18   Binnie Rail, MD  Multiple Vitamin (MULTIVITAMIN) tablet Take 1 tablet by mouth daily.    [provider]  mupirocin cream (BACTROBAN) 2 % Apply 1 application topically 2 (two) times daily. 01/12/17   Wendie Agreste, MD  nitroGLYCERIN (NITROSTAT) 0.4 MG SL tablet Place 1 tablet (0.4 mg total) under the tongue every 5 (five) minutes as needed for chest pain. 12/13/14   Barrett, Evelene Croon, PA-C  omeprazole (PRILOSEC) 20 MG capsule TAKE 1 CAPSULE BY MOUTH  DAILY 02/17/18   Binnie Rail, MD  oxyCODONE-acetaminophen (PERCOCET) 10-325 MG tablet Take 1 tablet by mouth every 8 (eight) hours as needed for pain.    [provider]  potassium gluconate (EQL POTASSIUM GLUCONATE) 595 (99 K) MG TABS tablet Take 595 mg by mouth daily.    [provider]  tadalafil (CIALIS) 20 MG tablet Take 1 tablet (20 mg total) by mouth daily as needed. For erectile dysfunction 12/08/16   Binnie Rail, MD  Tiotropium Bromide-Olodaterol (STIOLTO RESPIMAT) 2.5-2.5 MCG/ACT AERS Inhale 2 puffs into the lungs daily. 01/04/18   Collene Gobble, MD  tiZANidine (ZANAFLEX) 4 MG capsule Take 1 capsule (4 mg total) by mouth at bedtime. 10/14/17   Binnie Rail, MD  vitamin C (ASCORBIC ACID) 500 MG tablet Take 1,000 mg by mouth daily.     [provider]  VITAMIN D,  CHOLECALCIFEROL, PO Take 50 mcg by mouth daily.    [provider]  zolpidem (AMBIEN) 10 MG tablet Take 1 tablet (10 mg total) by mouth at bedtime as needed for sleep. 01/28/18 02/27/18  Binnie Rail, MD    Family History Family History  Problem Relation Age of Onset  . Coronary artery disease Brother   . Heart disease Father   . Lung cancer Father   . Kidney cancer Father   . Prostate cancer Father   . Arthritis Mother   . Lung cancer Mother     Social History Social History   Tobacco Use  . Smoking status: Former Smoker    Packs/day: 2.00    Years: 10.00    Pack years: 20.00    Types: Cigarettes    Last attempt to quit: 07/18/1975    Years since quitting: 42.6  . Smokeless tobacco: Never Used  Substance Use Topics  . Alcohol use: Yes    Comment: 1-2 drinks/day  . Drug use: No     Allergies   Other   Review of Systems Review of Systems  Constitutional: Negative for activity change, appetite change and fever.  HENT: Negative for dental problem, nosebleeds and trouble swallowing.   Eyes: Negative for pain and visual disturbance.  Respiratory: Negative for shortness of breath.   Cardiovascular: Negative for chest pain.  Gastrointestinal: Negative for abdominal pain, nausea and vomiting.  Genitourinary: Negative for dysuria and hematuria.  Musculoskeletal: Positive for arthralgias, back pain and neck pain. Negative for joint swelling.  Skin: Positive for wound.  Neurological: Positive for headaches. Negative for weakness and numbness.  Psychiatric/Behavioral: Negative for confusion.     Physical Exam Updated Vital Signs BP (!) 145/53   Pulse 79   Temp 98.3 F (36.8 C) (Oral)   Resp 15   SpO2 96%  Physical Exam  Constitutional: He is oriented to person, place, and time. He appears well-developed and well-nourished.  HENT:  Head: Normocephalic.  Patient has a large hematoma to the posterior scalp.  There is no overlying abrasion but no  lacerations.  Eyes: Pupils are equal, round, and reactive to light.  Neck: Normal range of motion. Neck supple.  Patient has tenderness to the mid and lower cervical spine.  There is also some tenderness to the upper thoracic spine.  No pain to the lumbosacral spine.  No step-offs or deformities.  Cardiovascular: Normal rate, regular rhythm and normal heart sounds.  Pulmonary/Chest: Effort normal and breath sounds normal. No respiratory distress. He has no wheezes. He has no rales. He exhibits no tenderness.  Abdominal: Soft. Bowel sounds are normal. There is no tenderness. There is no rebound and no guarding.  Musculoskeletal: He exhibits no edema.  Patient has tenderness to both hands mostly on the thumb and index finger.  There is no deformity or swelling.  No wounds.  There is some pain on range of motion of both shoulders but no swelling or deformity.  No pain to the elbows.  No pain on range of motion of the lower extremities.  He has normal sensation and motor function distally.  Pedal pulses are intact.  Lymphadenopathy:    He has no cervical adenopathy.  Neurological: He is alert and oriented to person, place, and time. He has normal strength. No sensory deficit.  Skin: Skin is warm and dry. No rash noted.  Psychiatric: He has a normal mood and affect.     ED Treatments / Results  Labs (all labs ordered are listed, but only abnormal results are displayed) Labs Reviewed  BASIC METABOLIC PANEL - Abnormal; Notable for the following components:      Result Value   Potassium 3.4 (*)    Glucose, Bld 180 (*)    All other components within normal limits  CBC WITH DIFFERENTIAL/PLATELET - Abnormal; Notable for the following components:   Platelets 127 (*)    Monocytes Absolute 1.3 (*)    All other components within normal limits  I-STAT CHEM 8, ED - Abnormal; Notable for the following components:   BUN 25 (*)    Glucose, Bld 176 (*)    Calcium, Ion 1.07 (*)    All other components  within normal limits  PROTIME-INR  APTT    EKG None  Radiology Dg Shoulder Right  Result Date: 03/06/2018 CLINICAL DATA:  Fall EXAM: RIGHT SHOULDER - 2+ VIEW COMPARISON:  None. FINDINGS: Postoperative changes in the region of the rotator cuff insertion with anchor noted in the humeral head. Degenerative changes most pronounced in the [facility] joint. No acute bony abnormality. Specifically, no fracture, subluxation, or dislocation. IMPRESSION: No acute bony abnormality. Electronically Signed   By: Rolm Baptise M.D.   On: 03/06/2018 21:22   Ct Head Wo Contrast  Result Date: 03/06/2018 CLINICAL DATA:  Fall with head injury. Neck pain. Anticoagulated on Eliquis. EXAM: CT HEAD WITHOUT CONTRAST CT CERVICAL SPINE WITHOUT CONTRAST TECHNIQUE: Multidetector CT imaging of the head and cervical spine was performed following the standard protocol without intravenous contrast. Multiplanar CT image reconstructions of the cervical spine were also generated. COMPARISON:  12/23/2016 brain MRI. FINDINGS: CT HEAD FINDINGS Brain: Anterior right parafalcine subdural hematoma measuring 6 mm thickness. No intra-axial hemorrhage. No midline shift. No significant mass-effect. No CT evidence of acute infarction. Generalized cerebral volume loss. Nonspecific mild subcortical and periventricular white matter hypodensity,  most in keeping with chronic small vessel ischemic change. No ventriculomegaly. Vascular: No acute abnormality. Skull: No evidence of calvarial fracture. Sinuses/Orbits: The visualized paranasal sinuses are essentially clear. Other: Large midline posterior scalp hematoma. The mastoid air cells are unopacified. CT CERVICAL SPINE FINDINGS Alignment: Straightening of the cervical spine. No facet subluxation. Dens is well positioned between the lateral masses of C1. Minimal 2 mm anterolisthesis at C2-3 and C3-4. Skull base and vertebrae: No acute fracture. No primary bone lesion or focal pathologic process. Soft tissues  and spinal canal: No prevertebral edema. No visible canal hematoma. Disc levels: Marked multilevel degenerative disc disease in the mid to lower cervical spine. Advanced bilateral facet arthropathy. Moderate degenerative foraminal stenosis on the right at C3-4. Upper chest: No acute abnormality. Other: Visualized mastoid air cells appear clear. No discrete thyroid nodules. No pathologically enlarged cervical nodes. IMPRESSION: CT HEAD: 1. Anterior right parafalcine subdural hematoma. No significant mass effect. No midline shift. 2. Large midline posterior scalp hematoma. 3. Generalized cerebral volume loss with mild chronic small vessel ischemic changes in the cerebral white matter. CT CERVICAL SPINE: 1. No cervical spine fracture or facet subluxation. 2. Moderate to severe multilevel degenerative changes in the cervical spine as detailed. Critical Value/emergent results were called by telephone at the time of interpretation on 03/06/2018 at 9:48 pm to Dr. Threasa Beards Kshawn Canal , who verbally acknowledged these results. Electronically Signed   By: Ilona Sorrel M.D.   On: 03/06/2018 21:50   Ct Cervical Spine Wo Contrast  Result Date: 03/06/2018 CLINICAL DATA:  Fall with head injury. Neck pain. Anticoagulated on Eliquis. EXAM: CT HEAD WITHOUT CONTRAST CT CERVICAL SPINE WITHOUT CONTRAST TECHNIQUE: Multidetector CT imaging of the head and cervical spine was performed following the standard protocol without intravenous contrast. Multiplanar CT image reconstructions of the cervical spine were also generated. COMPARISON:  12/23/2016 brain MRI. FINDINGS: CT HEAD FINDINGS Brain: Anterior right parafalcine subdural hematoma measuring 6 mm thickness. No intra-axial hemorrhage. No midline shift. No significant mass-effect. No CT evidence of acute infarction. Generalized cerebral volume loss. Nonspecific mild subcortical and periventricular white matter hypodensity, most in keeping with chronic small vessel ischemic change. No  ventriculomegaly. Vascular: No acute abnormality. Skull: No evidence of calvarial fracture. Sinuses/Orbits: The visualized paranasal sinuses are essentially clear. Other: Large midline posterior scalp hematoma. The mastoid air cells are unopacified. CT CERVICAL SPINE FINDINGS Alignment: Straightening of the cervical spine. No facet subluxation. Dens is well positioned between the lateral masses of C1. Minimal 2 mm anterolisthesis at C2-3 and C3-4. Skull base and vertebrae: No acute fracture. No primary bone lesion or focal pathologic process. Soft tissues and spinal canal: No prevertebral edema. No visible canal hematoma. Disc levels: Marked multilevel degenerative disc disease in the mid to lower cervical spine. Advanced bilateral facet arthropathy. Moderate degenerative foraminal stenosis on the right at C3-4. Upper chest: No acute abnormality. Other: Visualized mastoid air cells appear clear. No discrete thyroid nodules. No pathologically enlarged cervical nodes. IMPRESSION: CT HEAD: 1. Anterior right parafalcine subdural hematoma. No significant mass effect. No midline shift. 2. Large midline posterior scalp hematoma. 3. Generalized cerebral volume loss with mild chronic small vessel ischemic changes in the cerebral white matter. CT CERVICAL SPINE: 1. No cervical spine fracture or facet subluxation. 2. Moderate to severe multilevel degenerative changes in the cervical spine as detailed. Critical Value/emergent results were called by telephone at the time of interpretation on 03/06/2018 at 9:48 pm to Dr. Threasa Beards Stela Iwasaki , who verbally acknowledged these results. Electronically  Signed   By: Ilona Sorrel M.D.   On: 03/06/2018 21:50   Ct Thoracic Spine Wo Contrast  Result Date: 03/06/2018 CLINICAL DATA:  [place] [address] backwards pushing wheelchair on ramp. History of thoracic spine fracture. EXAM: CT THORACIC SPINE WITHOUT CONTRAST TECHNIQUE: Multidetector CT images of the thoracic were obtained using the standard protocol  without intravenous contrast. COMPARISON:  CT chest May 22, 2016 FINDINGS: ALIGNMENT: Maintained thoracic kyphosis. No malalignment. VERTEBRAE: Old moderate T8 compression fracture. Old mild T5 superior endplate compression fracture. Remaining thoracic vertebral bodies intact. Scattered old Schmorl's nodes. Osteopenia without destructive bony lesions. Intervertebral disc heights preserved with mild ventral endplate mid to lower thoracic spine. Stable bilateral 8th rib bone islands. PARASPINAL AND OTHER SOFT TISSUES: Nonacute. Moderate calcific atherosclerosis descending aorta DISC LEVELS: No disc bulge or canal stenosis. Mild T8-9 through T9-10 neural foraminal narrowing. IMPRESSION: 1. No acute fracture or malalignment. 2. Old moderate T8 and mild T5 compression fractures. Electronically Signed   By: Elon Alas M.D.   On: 03/06/2018 21:59   Dg Shoulder Left  Result Date: 03/06/2018 CLINICAL DATA:  Fall, left shoulder pain EXAM: LEFT SHOULDER - 2+ VIEW COMPARISON:  None. FINDINGS: Degenerative changes with joint space loss and spurring in the [facility] and glenohumeral joints. Subacromial joint space loss, likely related to chronic rotator cuff disease. No acute bony abnormality. Specifically, no fracture, subluxation, or dislocation. IMPRESSION: Degenerative changes.  No acute bony abnormality. Electronically Signed   By: Rolm Baptise M.D.   On: 03/06/2018 21:24   Dg Hand Complete Left  Result Date: 03/06/2018 CLINICAL DATA:  Fall.  Bilateral wrist pain. EXAM: LEFT HAND - COMPLETE 3+ VIEW COMPARISON:  None. FINDINGS: Degenerative changes at the 1st carpometacarpal joint and diffusely throughout the IP joints. No acute bony abnormality. Specifically, no fracture, subluxation, or dislocation. IMPRESSION: No acute bony abnormality. Electronically Signed   By: Rolm Baptise M.D.   On: 03/06/2018 21:23   Dg Hand Complete Right  Result Date: 03/06/2018 CLINICAL DATA:  84 year old male with fall and right  hand pain. EXAM: RIGHT HAND - COMPLETE 3+ VIEW COMPARISON:  Bilateral and radiograph dated 05/23/2012 FINDINGS: There is no acute fracture or dislocation. The bones are osteopenic. There is severe arthritic changes of the base of the thumb similar to prior radiograph. The soft tissues appear unremarkable. IMPRESSION: 1. No acute fracture or dislocation. 2. 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 Electronically Signed   By: Anner Crete M.D.   On: 03/06/2018 21:25    Procedures Procedures (including critical care time)  Medications Ordered in ED Medications  prothrombin complex conc human (KCENTRA) IVPB 4,918 Units (4,918 Units Intravenous New Bag/Given 03/06/18 2255)  Tdap (BOOSTRIX) injection 0.5 mL (0.5 mLs Intramuscular Given 03/06/18 2210)  fentaNYL (SUBLIMAZE) injection 50 mcg (50 mcg Intravenous Given 03/06/18 2213)     Initial Impression / Assessment and Plan / ED Course  I have reviewed the triage vital signs and the nursing notes.  Pertinent labs & imaging results that were available during my care of the patient were reviewed by me and considered in my medical decision making (see chart for details).     21:55 CT shows SDH, will start [place], paged neurosurgery  Dr. Annette Stable has seen the patient.  He feels it has a low likelihood of expansion.  He requests admission to the medicine service and repeat head CT in the morning.  I spoke with Dr. Hal Hope who will admit the patient.  His labs are  reviewed and are non-concerning.  His other imaging studies reveal no evidence of acute abnormalities.  CRITICAL CARE Performed by: Malvin Johns Total critical care time: 60 minutes Critical care time was exclusive of separately billable procedures and treating other patients. Critical care was necessary to treat or prevent imminent or life-threatening deterioration. Critical care was time spent personally by me on the following activities: development of treatment plan with  patient and/or surrogate as well as nursing, discussions with consultants, evaluation of patient's response to treatment, examination of patient, obtaining history from patient or surrogate, ordering and performing treatments and interventions, ordering and review of laboratory studies, ordering and review of radiographic studies, pulse oximetry and re-evaluation of patient's condition.     Final Clinical Impressions(s) / ED Diagnoses   Final diagnoses:  SDH (subdural hematoma) ([place])  Fall, initial encounter  Multiple contusions    ED Discharge Orders    None       Malvin Johns, MD 03/06/18 2302

## 2018-03-07 ENCOUNTER — Observation Stay (HOSPITAL_COMMUNITY): Payer: Medicare Other

## 2018-03-07 DIAGNOSIS — I629 Nontraumatic intracranial hemorrhage, unspecified: Secondary | ICD-10-CM

## 2018-03-07 DIAGNOSIS — S065X0A Traumatic subdural hemorrhage without loss of consciousness, initial encounter: Secondary | ICD-10-CM | POA: Diagnosis not present

## 2018-03-07 DIAGNOSIS — S0003XA Contusion of scalp, initial encounter: Secondary | ICD-10-CM | POA: Diagnosis not present

## 2018-03-07 LAB — BASIC METABOLIC PANEL
Anion gap: 9 (ref 5–15)
BUN: 16 mg/dL (ref 8–23)
CO2: 26 mmol/L (ref 22–32)
Calcium: 8.7 mg/dL — ABNORMAL LOW (ref 8.9–10.3)
Chloride: 106 mmol/L (ref 98–111)
Creatinine, Ser: 0.95 mg/dL (ref 0.61–1.24)
GFR calc Af Amer: >60 mL/min (ref >60)
GFR calc non Af Amer: >60 mL/min (ref >60)
Glucose, Bld: 160 mg/dL — ABNORMAL HIGH (ref 70–99)
Potassium: 3 mmol/L — ABNORMAL LOW (ref 3.5–5.1)
Sodium: 141 mmol/L (ref 135–145)

## 2018-03-07 LAB — CBC
HCT: 40.3 % (ref 39.0–52.0)
Hemoglobin: 13.1 g/dL (ref 13.0–17.0)
MCH: 29.8 pg (ref 26.0–34.0)
MCHC: 32.5 g/dL (ref 30.0–36.0)
MCV: 91.6 fL (ref 78.0–100.0)
Platelets: 133 10*3/uL — ABNORMAL LOW (ref 150–400)
RBC: 4.4 MIL/uL (ref 4.22–5.81)
RDW: 13 % (ref 11.5–15.5)
WBC: 8.4 10*3/uL (ref 4.0–10.5)

## 2018-03-07 LAB — MAGNESIUM: Magnesium: 2.2 mg/dL (ref 1.7–2.4)

## 2018-03-07 LAB — MRSA PCR SCREENING: MRSA by PCR: NEGATIVE

## 2018-03-07 LAB — GLUCOSE, CAPILLARY
Glucose-Capillary: 131 mg/dL — ABNORMAL HIGH (ref 70–99)
Glucose-Capillary: 143 mg/dL — ABNORMAL HIGH (ref 70–99)
Glucose-Capillary: 121 mg/dL — ABNORMAL HIGH (ref 70–99)

## 2018-03-07 MED ORDER — APIXABAN 5 MG PO TABS: 5.0000 mg | ORAL_TABLET | Freq: Two times a day (BID) | ORAL | 1 refills | Status: DC

## 2018-03-07 MED ORDER — DULOXETINE HCL 30 MG PO CPEP
30.0000 mg | ORAL_CAPSULE | Freq: Every day | ORAL | Status: DC
  Administered 2018-03-07: 30 mg via ORAL
  Filled 2018-03-07: qty 1

## 2018-03-07 MED ORDER — DULOXETINE HCL 60 MG PO CPEP
60.0000 mg | ORAL_CAPSULE | Freq: Every day | ORAL | Status: DC
  Administered 2018-03-07: 60 mg via ORAL
  Filled 2018-03-07: qty 1

## 2018-03-07 MED ORDER — POTASSIUM CHLORIDE CRYS ER 20 MEQ PO TBCR
40.0000 meq | EXTENDED_RELEASE_TABLET | Freq: Once | ORAL | Status: AC
  Administered 2018-03-07: 40 meq via ORAL
  Filled 2018-03-07: qty 2

## 2018-03-07 NOTE — Discharge Summary (Signed)
Physician Discharge Summary  Macintyre Alexa ZOX:[id number] DOB: Sep 04, 1932 DOA: 03/06/2018  PCP: Binnie Rail, MD  Admit date: 03/06/2018 Discharge date: 03/07/2018  Time spent: 65 minutes  Recommendations for Outpatient Follow-up:  1. Hold Eliquis through March 20 2018. Resume Eliquis March 21 2018. 2. Follow up with PCP for evaluation of head wound and gait stability   Discharge Diagnoses:  Principal Problem:   Intracranial hemorrhage ([place]) Active Problems:   Essential hypertension   PAF (paroxysmal atrial fibrillation) ([facility])   COPD (chronic obstructive pulmonary disease) ([facility])   Sleep apnea, obstructive   Hypothyroidism   Type 2 diabetes mellitus ([place])   Fall   Hereditary and idiopathic peripheral neuropathy   Memory disorder   Gout   Chronic anticoagulation   Discharge Condition: stable  Diet recommendation: carb modified and heart healhty  There were no vitals filed for this visit.  History of present illness:  Jahzeel Poythress is a 84 y.o. male with history of paroxysmal atrial fibrillation on Eliquis, hypertension, diabetes mellitus type 2, COPD, sleep apnea, chronic back pain, hypothyroidism, depression was brought to the ER on 03/06/18 after mechanical fall.  Patient stated he was moving  his wife's wheelchair when he tripped and fell backwards and hit his head.  Denied losing consciousness.  Had some headache injured area. Denies any recent illness, fever, nausea, vomiting diarrhea. Complained intermittent visual disturbance but stated this was no new. No chest pain/palpitation, no sob, cough. No dysuria hematuria or frequency or urgency  Hospital Course:  1. Intracranial bleed and scalp hematoma status post mechanical fall on Eliquis -patient's Eliquis was held and was given [place]. Evaluated by neurosurgery who opined very low likelihood of progression of hemorrhage and recommended obs admit with repeat CT in am. CT on day of discharge revealed stable subdural  along the falx on there right anteriorly with persistent posterior scalp hematoma.  At discharge patient with minimal headache and no vertigo. Ambulated halls without problem. Alert and oriented.   Appreciate neurosurgery consult recommendation.   2. Paroxysmal atrial fibrillation -holding Eliquis for 2 weeks per neurosurgery recommendation due to intracranial bleed.   On Cardizem. 3. Hypertension on Cardizem hydrochlorothiazide and Cardura. Fair control 4. Diabetes mellitus type 2. Fair control. Recent A1c 7.1. Resume home regimen at discharge 5. Sleep apnea on CPAP. 6. Hypothyroidism on Synthroid. 7. Chronic pain on pain relief medications which will be continued. 8. COPD not actively wheezing.  Procedures: Consultations: Dr. Trenton Gammon neurosurgery   Discharge Exam: Vitals:   03/07/18 0900 03/07/18 1228  BP: 135/63 137/66  Pulse: 69 61  Resp: 18 18  Temp: 98.6 F (37 C) 97.9 F (36.6 C)  SpO2: 100% 97%    General: awake alert oriented x3. No acute distress Cardiovascular: irregularly irregular no mgr no LE edema Respiratory: norma effort BS distant but clear no wheeze Neuro: speech clear, facial symmetry steady gait. Bilateral grip 5/5.   Discharge Instructions   Discharge Instructions    Call MD for:  persistant dizziness or light-headedness   Complete by:  As directed    Call MD for:  persistant nausea and vomiting   Complete by:  As directed    Call MD for:  temperature >100.4   Complete by:  As directed    Diet - low sodium heart healthy   Complete by:  As directed    Discharge instructions   Complete by:  As directed    Do not take eliquis until October 7.  Follow up with PCP  in 1-2 weeks for evaluation of head wound and gait stability   Increase activity slowly   Complete by:  As directed      Allergies as of 03/07/2018      Reactions   Other    Other reaction(s): Respiratory distress      Medication List    STOP taking these medications   mupirocin cream  2 % Commonly known as:  BACTROBAN     TAKE these medications   acetaminophen 500 MG tablet Commonly known as:  TYLENOL Take 2 tablets (1,000 mg total) by mouth every 8 (eight) hours as needed. What changed:  when to take this   allopurinol 300 MG tablet Commonly known as:  ZYLOPRIM TAKE 1 TABLET BY MOUTH  EVERY EVENING What changed:  when to take this   apixaban 5 MG Tabs tablet Commonly known as:  ELIQUIS Take 1 tablet (5 mg total) by mouth 2 (two) times daily. Hold through October 6. Resume October 7. What changed:    how much to take  additional instructions   atorvastatin 20 MG tablet Commonly known as:  LIPITOR TAKE 1 TABLET BY MOUTH  DAILY What changed:  when to take this   Biotin 5000 MCG Tabs Take 5,000 mcg by mouth daily.   blood glucose meter kit and supplies Kit Dispense based on patient and insurance preference. Use up to four times daily as directed. (FOR E11.9).   brimonidine 0.15 % ophthalmic solution Commonly known as:  ALPHAGAN Place 1 drop into the left eye 2 (two) times daily.   calcium-vitamin D 500-200 MG-UNIT tablet Commonly known as:  OSCAL WITH D Take 1 tablet by mouth daily with breakfast.   COLCRYS 0.6 MG tablet Generic drug:  colchicine Take 0.6 mg by mouth daily as needed (flareups).   diltiazem 300 MG 24 hr capsule Commonly known as:  CARDIZEM CD TAKE 1 CAPSULE DAILY BY  MOUTH. What changed:  See the new instructions.   doxazosin 8 MG tablet Commonly known as:  CARDURA TAKE 1 TABLET BY MOUTH  DAILY   DULoxetine 30 MG capsule Commonly known as:  CYMBALTA TAKE 2 CAPSULES BY MOUTH IN THE MORNING AND 1 CAPSULE  BY MOUTH IN THE EVENING What changed:  See the new instructions.   EQL POTASSIUM GLUCONATE 595 (99 K) MG Tabs tablet Generic drug:  potassium gluconate Take 595 mg by mouth daily.   hydrochlorothiazide 25 MG tablet Commonly known as:  HYDRODIURIL TAKE 1 TABLET BY MOUTH  DAILY   latanoprost 0.005 % ophthalmic  solution Commonly known as:  XALATAN Place 1 drop into both eyes at bedtime.   levalbuterol 45 MCG/ACT inhaler Commonly known as:  XOPENEX HFA Inhale 1-2 puffs into the lungs every 4 (four) hours as needed for wheezing.   levothyroxine 112 MCG tablet Commonly known as:  SYNTHROID, LEVOTHROID Take 1 tablet (112 mcg total) by mouth daily.   magnesium oxide 400 MG tablet Commonly known as:  MAG-OX Take 400 mg by mouth daily.   metFORMIN 500 MG tablet Commonly known as:  GLUCOPHAGE Take 1 tablet (500 mg total) by mouth 2 (two) times daily with a meal.   mometasone 50 MCG/ACT nasal spray Commonly known as:  NASONEX Place 2 sprays into the nose daily.   multivitamin tablet Take 1 tablet by mouth daily.   nitroGLYCERIN 0.4 MG SL tablet Commonly known as:  NITROSTAT Place 1 tablet (0.4 mg total) under the tongue every 5 (five) minutes as needed for chest  pain.   omeprazole 20 MG capsule Commonly known as:  PRILOSEC TAKE 1 CAPSULE BY MOUTH  DAILY   oxyCODONE-acetaminophen 10-325 MG tablet Commonly known as:  PERCOCET Take 1 tablet by mouth every 8 (eight) hours as needed for pain.   tadalafil 20 MG tablet Commonly known as:  ADCIRCA/CIALIS Take 1 tablet (20 mg total) by mouth daily as needed. For erectile dysfunction   Tiotropium Bromide-Olodaterol 2.5-2.5 MCG/ACT Aers Inhale 2 puffs into the lungs daily. What changed:  Another medication with the same name was added. Make sure you understand how and when to take each.   STIOLTO RESPIMAT 2.5-2.5 MCG/ACT Aers Generic drug:  Tiotropium Bromide-Olodaterol USE 2 PUFFS DAILY What changed:  You were already taking a medication with the same name, and this prescription was added. Make sure you understand how and when to take each.   tiZANidine 4 MG capsule Commonly known as:  ZANAFLEX Take 1 capsule (4 mg total) by mouth at bedtime.   vitamin C 500 MG tablet Commonly known as:  ASCORBIC ACID Take 1,000 mg by mouth daily.    VITAMIN D (CHOLECALCIFEROL) PO Take 50 mcg by mouth daily.   zolpidem 10 MG tablet Commonly known as:  AMBIEN Take 1 tablet (10 mg total) by mouth at bedtime as needed for sleep.      Allergies  Allergen Reactions  . Other     Other reaction(s): Respiratory distress      The results of significant diagnostics from this hospitalization (including imaging, microbiology, ancillary and laboratory) are listed below for reference.    Significant Diagnostic Studies: Dg Shoulder Right  Result Date: 03/06/2018 CLINICAL DATA:  Fall EXAM: RIGHT SHOULDER - 2+ VIEW COMPARISON:  None. FINDINGS: Postoperative changes in the region of the rotator cuff insertion with anchor noted in the humeral head. Degenerative changes most pronounced in the [facility] joint. No acute bony abnormality. Specifically, no fracture, subluxation, or dislocation. IMPRESSION: No acute bony abnormality. Electronically Signed   By: Rolm Baptise M.D.   On: 03/06/2018 21:22   Ct Head Wo Contrast  Result Date: 03/07/2018 CLINICAL DATA:  Fall 2 days ago with headaches, initial encounter EXAM: CT HEAD WITHOUT CONTRAST TECHNIQUE: Contiguous axial images were obtained from the base of the skull through the vertex without intravenous contrast. COMPARISON:  03/06/2018 FINDINGS: Brain: Mild atrophic changes are noted. The right anterior parafalcine subdural hematoma is again identified and stable measuring approximately 6 mm. No new subdural hematoma is seen. No subarachnoid hemorrhage is noted. No infarct is seen. Vascular: No hyperdense vessel or unexpected calcification. Skull: Skull is within normal limits. Sinuses/Orbits: No acute finding. Other: Posterior midline scalp hematoma is again identified and stable. IMPRESSION: Stable subdural along the falx on the right anteriorly Persistent posterior scalp hematoma No acute abnormality is noted. Electronically Signed   By: Inez Catalina M.D.   On: 03/07/2018 15:10   Ct Head Wo  Contrast  Result Date: 03/06/2018 CLINICAL DATA:  Fall with head injury. Neck pain. Anticoagulated on Eliquis. EXAM: CT HEAD WITHOUT CONTRAST CT CERVICAL SPINE WITHOUT CONTRAST TECHNIQUE: Multidetector CT imaging of the head and cervical spine was performed following the standard protocol without intravenous contrast. Multiplanar CT image reconstructions of the cervical spine were also generated. COMPARISON:  12/23/2016 brain MRI. FINDINGS: CT HEAD FINDINGS Brain: Anterior right parafalcine subdural hematoma measuring 6 mm thickness. No intra-axial hemorrhage. No midline shift. No significant mass-effect. No CT evidence of acute infarction. Generalized cerebral volume loss. Nonspecific mild subcortical and periventricular  white matter hypodensity, most in keeping with chronic small vessel ischemic change. No ventriculomegaly. Vascular: No acute abnormality. Skull: No evidence of calvarial fracture. Sinuses/Orbits: The visualized paranasal sinuses are essentially clear. Other: Large midline posterior scalp hematoma. The mastoid air cells are unopacified. CT CERVICAL SPINE FINDINGS Alignment: Straightening of the cervical spine. No facet subluxation. Dens is well positioned between the lateral masses of C1. Minimal 2 mm anterolisthesis at C2-3 and C3-4. Skull base and vertebrae: No acute fracture. No primary bone lesion or focal pathologic process. Soft tissues and spinal canal: No prevertebral edema. No visible canal hematoma. Disc levels: Marked multilevel degenerative disc disease in the mid to lower cervical spine. Advanced bilateral facet arthropathy. Moderate degenerative foraminal stenosis on the right at C3-4. Upper chest: No acute abnormality. Other: Visualized mastoid air cells appear clear. No discrete thyroid nodules. No pathologically enlarged cervical nodes. IMPRESSION: CT HEAD: 1. Anterior right parafalcine subdural hematoma. No significant mass effect. No midline shift. 2. Large midline posterior  scalp hematoma. 3. Generalized cerebral volume loss with mild chronic small vessel ischemic changes in the cerebral white matter. CT CERVICAL SPINE: 1. No cervical spine fracture or facet subluxation. 2. Moderate to severe multilevel degenerative changes in the cervical spine as detailed. Critical Value/emergent results were called by telephone at the time of interpretation on 03/06/2018 at 9:48 pm to Dr. Threasa Beards BELFI , who verbally acknowledged these results. Electronically Signed   By: Ilona Sorrel M.D.   On: 03/06/2018 21:50   Ct Cervical Spine Wo Contrast  Result Date: 03/06/2018 CLINICAL DATA:  Fall with head injury. Neck pain. Anticoagulated on Eliquis. EXAM: CT HEAD WITHOUT CONTRAST CT CERVICAL SPINE WITHOUT CONTRAST TECHNIQUE: Multidetector CT imaging of the head and cervical spine was performed following the standard protocol without intravenous contrast. Multiplanar CT image reconstructions of the cervical spine were also generated. COMPARISON:  12/23/2016 brain MRI. FINDINGS: CT HEAD FINDINGS Brain: Anterior right parafalcine subdural hematoma measuring 6 mm thickness. No intra-axial hemorrhage. No midline shift. No significant mass-effect. No CT evidence of acute infarction. Generalized cerebral volume loss. Nonspecific mild subcortical and periventricular white matter hypodensity, most in keeping with chronic small vessel ischemic change. No ventriculomegaly. Vascular: No acute abnormality. Skull: No evidence of calvarial fracture. Sinuses/Orbits: The visualized paranasal sinuses are essentially clear. Other: Large midline posterior scalp hematoma. The mastoid air cells are unopacified. CT CERVICAL SPINE FINDINGS Alignment: Straightening of the cervical spine. No facet subluxation. Dens is well positioned between the lateral masses of C1. Minimal 2 mm anterolisthesis at C2-3 and C3-4. Skull base and vertebrae: No acute fracture. No primary bone lesion or focal pathologic process. Soft tissues and  spinal canal: No prevertebral edema. No visible canal hematoma. Disc levels: Marked multilevel degenerative disc disease in the mid to lower cervical spine. Advanced bilateral facet arthropathy. Moderate degenerative foraminal stenosis on the right at C3-4. Upper chest: No acute abnormality. Other: Visualized mastoid air cells appear clear. No discrete thyroid nodules. No pathologically enlarged cervical nodes. IMPRESSION: CT HEAD: 1. Anterior right parafalcine subdural hematoma. No significant mass effect. No midline shift. 2. Large midline posterior scalp hematoma. 3. Generalized cerebral volume loss with mild chronic small vessel ischemic changes in the cerebral white matter. CT CERVICAL SPINE: 1. No cervical spine fracture or facet subluxation. 2. Moderate to severe multilevel degenerative changes in the cervical spine as detailed. Critical Value/emergent results were called by telephone at the time of interpretation on 03/06/2018 at 9:48 pm to Dr. Threasa Beards BELFI , who verbally acknowledged  these results. Electronically Signed   By: Ilona Sorrel M.D.   On: 03/06/2018 21:50   Ct Thoracic Spine Wo Contrast  Result Date: 03/06/2018 CLINICAL DATA:  [place] [address] backwards pushing wheelchair on ramp. History of thoracic spine fracture. EXAM: CT THORACIC SPINE WITHOUT CONTRAST TECHNIQUE: Multidetector CT images of the thoracic were obtained using the standard protocol without intravenous contrast. COMPARISON:  CT chest May 22, 2016 FINDINGS: ALIGNMENT: Maintained thoracic kyphosis. No malalignment. VERTEBRAE: Old moderate T8 compression fracture. Old mild T5 superior endplate compression fracture. Remaining thoracic vertebral bodies intact. Scattered old Schmorl's nodes. Osteopenia without destructive bony lesions. Intervertebral disc heights preserved with mild ventral endplate mid to lower thoracic spine. Stable bilateral 8th rib bone islands. PARASPINAL AND OTHER SOFT TISSUES: Nonacute. Moderate calcific  atherosclerosis descending aorta DISC LEVELS: No disc bulge or canal stenosis. Mild T8-9 through T9-10 neural foraminal narrowing. IMPRESSION: 1. No acute fracture or malalignment. 2. Old moderate T8 and mild T5 compression fractures. Electronically Signed   By: Elon Alas M.D.   On: 03/06/2018 21:59   Dg Shoulder Left  Result Date: 03/06/2018 CLINICAL DATA:  Fall, left shoulder pain EXAM: LEFT SHOULDER - 2+ VIEW COMPARISON:  None. FINDINGS: Degenerative changes with joint space loss and spurring in the [facility] and glenohumeral joints. Subacromial joint space loss, likely related to chronic rotator cuff disease. No acute bony abnormality. Specifically, no fracture, subluxation, or dislocation. IMPRESSION: Degenerative changes.  No acute bony abnormality. Electronically Signed   By: Rolm Baptise M.D.   On: 03/06/2018 21:24   Dg Hand Complete Left  Result Date: 03/06/2018 CLINICAL DATA:  Fall.  Bilateral wrist pain. EXAM: LEFT HAND - COMPLETE 3+ VIEW COMPARISON:  None. FINDINGS: Degenerative changes at the 1st carpometacarpal joint and diffusely throughout the IP joints. No acute bony abnormality. Specifically, no fracture, subluxation, or dislocation. IMPRESSION: No acute bony abnormality. Electronically Signed   By: Rolm Baptise M.D.   On: 03/06/2018 21:23   Dg Hand Complete Right  Result Date: 03/06/2018 CLINICAL DATA:  84 year old male with fall and right hand pain. EXAM: RIGHT HAND - COMPLETE 3+ VIEW COMPARISON:  Bilateral and radiograph dated 05/23/2012 FINDINGS: There is no acute fracture or dislocation. The bones are osteopenic. There is severe arthritic changes of the base of the thumb similar to prior radiograph. The soft tissues appear unremarkable. IMPRESSION: 1. No acute fracture or dislocation. 2. 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 Electronically Signed   By: Anner Crete M.D.   On: 03/06/2018 21:25    Microbiology: Recent Results (from the past 240 hour(s))  MRSA  PCR Screening     Status: None   Collection Time: 03/07/18  1:33 AM  Result Value Ref Range Status   MRSA by PCR NEGATIVE NEGATIVE Final    Comment:        The GeneXpert MRSA Assay (FDA approved for NASAL specimens only), is one component of a comprehensive MRSA colonization surveillance program. It is not intended to diagnose MRSA infection nor to guide or monitor treatment for MRSA infections. Performed at [facility], [place] [address] [place], [place] [address]      Labs: Basic Metabolic Panel: Recent Labs  Lab 03/06/18 2220 03/07/18 0553  NA 137  139 141  K 3.4*  4.7 3.0*  CL 102  103 106  CO2 27 26  GLUCOSE 180*  176* 160*  BUN 17  25* 16  CREATININE 1.03  1.00 0.95  CALCIUM 9.1 8.7*  MG  --  2.2   Liver Function Tests: No results for input(s): AST, ALT, ALKPHOS, BILITOT, PROT, ALBUMIN in the last 168 hours. No results for input(s): LIPASE, AMYLASE in the last 168 hours. No results for input(s): AMMONIA in the last 168 hours. CBC: Recent Labs  Lab 03/02/18 1440 03/06/18 2220 03/07/18 0553  WBC 6.5 7.2 8.4  NEUTROABS 3.0 4.3  --   HGB 13.9 14.3  14.6 13.1  HCT 41.6 44.4  43.0 40.3  MCV 89.8 92.9 91.6  PLT 118.0* 127* 133*   Cardiac Enzymes: No results for input(s): CKTOTAL, CKMB, CKMBINDEX, TROPONINI in the last 168 hours. BNP: BNP (last 3 results) No results for input(s): BNP in the last 8760 hours.  ProBNP (last 3 results) No results for input(s): PROBNP in the last 8760 hours.  CBG: Recent Labs  Lab 03/07/18 0829 03/07/18 1236  GLUCAP 131* 143*       Signed:  Radene Gunning MD.  [facility] 03/07/2018, 3:41 PM

## 2018-03-07 NOTE — Evaluation (Signed)
Physical Therapy Evaluation Patient Details Name: Jonathan [name] MRN: [id number] DOB: [birth year] Today's Date: 03/07/2018   History of Present Illness  Patient is a 84 y/o male who presents s/p fall backwards hitting his head. No LOC. Head CT- Anterior right parafalcine subdural hematoma. PMH includes DM, memory disorder, HTN, gout, glaucoma, depression.  Clinical Impression  Patient presents with impaired balance, dizziness, hx of visual deficits (last few weeks), decreased safety awareness/awareness of deficits, impaired memory and impaired mobility s/p above. Pt tolerated gait training with Min guard-Min A for balance/safety and Mod A for stair training due to complete LOB posteriorly. Pt reports independence for ADLs and drives PTA and lives with wife who is currently in the hospital for broken foot/cellulitis. Education on concussive symptoms. Pt reports recent hx of diplopia and blurry vision as well as running into things on left side with lots of near falls at home. Question subacute CVA vs plaque or occlusion? Recommend MRI/CTA as pt may have had prior CVA causing these issues PTA. Would benefit from OPPT to maximize independence, balance, mobility and decrease falls at home. Recommend SLP consult for cognitive assessment and OT consult as well. Will follow acutely.    Follow Up Recommendations Outpatient PT;Supervision for mobility/OOB    Equipment Recommendations  None recommended by PT    Recommendations for Other Services Speech consult;OT consult(cognitive assessment)     Precautions / Restrictions Precautions Precautions: Fall Restrictions Weight Bearing Restrictions: No      Mobility  Bed Mobility Overal bed mobility: Needs Assistance Bed Mobility: Rolling;Sidelying to Sit Rolling: Supervision Sidelying to sit: Supervision;HOB elevated       General bed mobility comments: Use of rail to get to EOB, increased time to sit up. Mild dizziness which resolved.    Transfers Overall transfer level: Needs assistance Equipment used: None Transfers: Sit to/from Stand Sit to Stand: Min guard         General transfer comment: Min guard for safety. Stood from [employer], transferred to chair post ambulation.  Ambulation/Gait Ambulation/Gait assistance: Min assist Gait Distance (Feet): 300 Feet Assistive device: None Gait Pattern/deviations: Step-through pattern;Staggering left;Leaning posteriorly;Wide base of support Gait velocity: decreased   General Gait Details: Slow, mildly unsteady gait with pt veering towards left (running into door), tolerated higher level balance with deviations in gait but no dizziness. See balance section for details.  Stairs Stairs: Yes Stairs assistance: Mod assist Stair Management: No rails;Two rails Number of Stairs: 3(+ 2 steps x2 bouts) General stair comments: Pt trying to negotiate steps without rail and has complete LOB requiring Mod A to prevent fall. Also with LOB posteriorly when descending.   Wheelchair Mobility    Modified Rankin (Stroke Patients Only) Modified Rankin (Stroke Patients Only) Pre-Morbid Rankin Score: Moderate disability Modified Rankin: Moderately severe disability     Balance Overall balance assessment: Needs assistance Sitting-balance support: Feet supported;No upper extremity supported Sitting balance-Leahy Scale: Good Sitting balance - Comments: Able to reach outside BoS and don socks without LOB or difficulty.    Standing balance support: During functional activity Standing balance-Leahy Scale: Fair Standing balance comment: Able to stand statically without UE support but requires UE support for dynamic tasks.             High level balance activites: Head turns High Level Balance Comments: Tolerated head turns with mild deviations in gait but no overt LOB.              Pertinent Vitals/Pain Pain Assessment: 0-10 Pain Score: (.5)  Pain Location: headache Pain  Descriptors / Indicators: Headache Pain Intervention(s): Monitored during session    Home Living Family/patient expects to be discharged to:: Private residence Living Arrangements: Spouse/significant other Available Help at Discharge: Family;Available PRN/intermittently(wife is at [facility] with broken foot on one side and cellulitis on the other) Type of Home: House Home Access: Stairs to enter;Ramped entrance Entrance Stairs-Rails: Right Entrance Stairs-Number of Steps: 3- using ramp right now  Home Layout: Two level Home Equipment: Numa - 2 wheels;Wheelchair - Regulatory affairs officer - single point      Prior Function Level of Independence: Independent         Comments: Drives. Retired Environmental education officer. Wife recently broke foot and hired someone to come in twice/week to help with cleaning/cooking. [place] from [place]- reports diplopia left eye and blurry vision right eye that is transient (not present today).     Hand Dominance   Dominant Hand: Right    Extremity/Trunk Assessment   Upper Extremity Assessment Upper Extremity Assessment: Defer to OT evaluation    Lower Extremity Assessment Lower Extremity Assessment: Overall WFL for tasks assessed;RLE deficits/detail;LLE deficits/detail RLE Sensation: history of peripheral neuropathy LLE Sensation: history of peripheral neuropathy    Cervical / Trunk Assessment Cervical / Trunk Assessment: Kyphotic  Communication   Communication: No difficulties  Cognition Arousal/Alertness: Awake/alert Behavior During Therapy: WFL for tasks assessed/performed Overall Cognitive Status: Impaired/Different from baseline Area of Impairment: Memory;Safety/judgement                     Memory: Decreased short-term memory   Safety/Judgement: Decreased awareness of safety     General Comments: A&Ox4; reports memory issues at baseline. Even though pt reports deficits, does not have awareness of safety, "i dont hold onto the  rail when climbing steps because I want to work on my balance." Drove home from [place] with diplopia.       General Comments General comments (skin integrity, edema, etc.): Sitting BP 146/57, Standing BP 131/67, Sitting post ambulation 148/80. Pt with bleeding on back of head where he fell.    Exercises     Assessment/Plan    PT Assessment Patient needs continued PT services  PT Problem List Decreased mobility;Decreased safety awareness;Impaired sensation;Decreased balance;Decreased activity tolerance;Decreased cognition;Decreased skin integrity;Decreased knowledge of precautions       PT Treatment Interventions DME instruction;Functional mobility training;Balance training;Patient/family education;Therapeutic activities;Stair training;Gait training;Therapeutic exercise;Cognitive remediation;Neuromuscular re-education    PT Goals (Current goals can be found in the Care Plan section)  Acute Rehab PT Goals Patient Stated Goal: to get home and get better- improve memory PT Goal Formulation: With patient Time For Goal Achievement: 03/21/18 Potential to Achieve Goals: Good    Frequency Min 4X/week   Barriers to discharge Decreased caregiver support;Inaccessible home environment wife is currently in [place] [facility]    [place] [facility] "6 Clicks" Daily Activity  Outcome Measure Difficulty turning over in bed (including adjusting bedclothes, sheets and blankets)?: None Difficulty moving from lying on back to sitting on the side of the bed? : A Little Difficulty sitting down on and standing up from a chair with arms (e.g., wheelchair, bedside commode, etc,.)?: A Little Help needed moving to and from a bed to chair (including a wheelchair)?: A Little Help needed walking in hospital room?: A Little Help needed climbing 3-5 steps with a railing? : A Lot 6 Click Score: 18    End  of Session Equipment Utilized During Treatment: Gait belt Activity Tolerance: Patient  tolerated treatment well Patient left: in chair;with call bell/phone within reach;with chair alarm set Nurse Communication: Mobility status;Other (comment)(need for MRI/CTA- vision changes) PT Visit Diagnosis: Unsteadiness on feet (R26.81);Difficulty in walking, not elsewhere classified (R26.2)    Time: 1594-7076 PT Time Calculation (min) (ACUTE ONLY): 33 min   Charges:   PT Evaluation $PT Eval Moderate Complexity: 1 Mod PT Treatments $Gait Training: 8-22 mins        Wray Kearns, PT, DPT Acute Rehabilitation Services Pager [phone] Office [place] 03/07/2018, 10:30 AM

## 2018-03-07 NOTE — Progress Notes (Signed)
Pt discharge education and instructions completed with pt. Pt discharge home with friend to transport him home. Pt IV and telemetry removed; posterior head hematoma cleansed and dressed. Pt transported off unit via wheelchair with belongings to the side. Delia Heady RN

## 2018-03-07 NOTE — Care Management Note (Signed)
Case Management Note  Patient Details  Name: Jonathan [name] MRN: [id number] Date of Birth: [birth year]  Subjective/Objective:     Pt in with intracranial hemorrhage. He is from home with his spouse who currently is admitted to [facility] hospital.               Action/Plan: Recommendations are for Outpatient therapy. CM met with the patient and he attends the [facility] and states he has a PT working with him there. He refuses any other forms of outpatient therapy.  Pt stats he has a friend that is going to provide transportation home.   Expected Discharge Date:  03/07/18               Expected Discharge Plan:  OP Rehab  In-House Referral:     Discharge planning Services  CM Consult  Post Acute Care Choice:    Choice offered to:     DME Arranged:    DME Agency:     HH Arranged:    HH Agency:     Status of Service:  Completed, signed off  If discussed at [employer] of Stay Meetings, dates discussed:    Additional Comments:  Pollie Friar, RN 03/07/2018, 4:31 PM

## 2018-03-07 NOTE — Progress Notes (Signed)
Looks better this morning.  Minimal headache.  No further vertigo.  Ambulated with therapy without problems.  Awake and alert.  Oriented and appropriate.  Speech fluent.  Judgment and insight appear intact.  Cranial nerve function normal bilateral.  Motor 5/5 bilaterally.  No pronator drift.  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  Continue mobilization.  Should have a follow-up head CT to rule out further bleeding.  If head CT scan stable then may be discharged home either today or tomorrow.  Patient needs to remain off Eliquis for at least 2 weeks.

## 2018-03-08 ENCOUNTER — Telehealth: Payer: Self-pay | Admitting: *Deleted

## 2018-03-08 DIAGNOSIS — R222 Localized swelling, mass and lump, trunk: Secondary | ICD-10-CM | POA: Diagnosis not present

## 2018-03-08 NOTE — Telephone Encounter (Signed)
Transition Care Management Follow-up Telephone Call   Date discharged? 03/07/18   How have you been since you were released from the hospital? Pt states he is doing alright   Do you understand why you were in the hospital? YES   Do you understand the discharge instructions? YES   Where were you discharged to? Home   Items Reviewed:  Medications reviewed: YES  Allergies reviewed: YES  Dietary changes reviewed: YES, carb modified and heart healthy  Referrals reviewed: No referral needed   Functional Questionnaire:   Activities of Daily Living (ADLs):   He states he are independent in the following: ambulation, bathing and hygiene, feeding, continence, grooming, toileting and dressing States he doesn't require assistance    Any transportation issues/concerns?: NO   Any patient concerns? NO   Confirmed importance and date/time of follow-up visits scheduled YES, appt 03/16/18  Provider Appointment booked with Dr. Quay Burow   Confirmed with patient if condition begins to worsen call PCP or go to the ER.  Patient was given the office number and encouraged to call back with question or concerns.  : YES

## 2018-03-11 ENCOUNTER — Telehealth: Payer: Self-pay | Admitting: *Deleted

## 2018-03-11 NOTE — Telephone Encounter (Signed)
Left a detailed message for pt that he will need an appt before we can grant surgical clearance for his upcoming procedure. Advised to call and make an appt.

## 2018-03-11 NOTE — Telephone Encounter (Signed)
Pt d/c from hospital 03/07/18 for fall and subdural hematoma.  Currently off Eliquis.      Primary Cardiologist:Brian Stanford Breed, MD  Chart reviewed as part of pre-operative protocol coverage. Because of Shayaan Turski's past medical history and time since last visit, he/she will require a follow-up visit in order to better assess preoperative cardiovascular risk.  Due to recent injury  Pre-op covering staff: - Please schedule appointment and call patient to inform them. - Please contact requesting surgeon's office via preferred method (i.e, phone, fax) to inform them of need for appointment prior to surgery.  Cecilie Kicks, NP  03/11/2018, 2:52 PM

## 2018-03-11 NOTE — Telephone Encounter (Signed)
   [facility] Pre-operative Risk Assessment    Request for surgical clearance:  1. What type of surgery is being performed? EXCISION OF LIPOMA LOWER LEFT BACK    2. When is this surgery scheduled? TBD   3. What type of clearance is required (medical clearance vs. Pharmacy clearance to hold med vs. Both)? BOTH  4. Are there any medications that need to be held prior to surgery and how long?ELIQUIS    5. Practice name and name of physician performing surgery? [facility] DR [facility] GROSS    6. What is your office phone [phone]    7.   What is your office fax [phone] [place]   8.   Anesthesia type (None, local, MAC, general) ?

## 2018-03-14 NOTE — Telephone Encounter (Signed)
Spoke with pt to schedule appt. Pt appt is  On October 10th at 8:30 am with Almyra Deforest. Pt verbalized understanding. Will fax notes to requesting surgeon's office.

## 2018-03-15 NOTE — Progress Notes (Signed)
Subjective:    Patient ID: Jonathan [name], male    DOB: 1933/01/14, 84 y.o.   MRN: [id number]  HPI The patient is here for follow up from the hospital.  He went to the ED on 9/22 after a mechanical fall.  He tripped and fell while moving his wife's wheelchair.  He hit his posterior head.  There was no LOC.  He had a headache at the area he injured his head.  A Ct scan in the ED showed an anterior right parafalcine subdural hematoma enlargemnt scalp hematoma.  Dr Trenton Gammon was consulted and advised observation since on Eliquis.  He was given Kcentra of reversal of Eliquis.  Neuro exam was nonfocal.  Repeat Ct scan the next morning showed stable bleed.  Eliquis to be on hold for two weeks - restart on October 7.  He was discharged home.   He was asked to see Dr Trenton Gammon, but when he called his office he was not able to see him until Oct 22 so he did not make an appointment.  Since being discharged from the hospital he has had intermittent head pain.  At times it is sharp, but the sharp pain on the last 3-4 seconds.  Tylenol helps the other headaches.  He does take Percocet at night, which is allows him to sleep.  He was not sleeping well until the past 2 nights he was able to sleep well.  He did not have any staples or sutures in the head because it was just an abrasion.  He did not have it wrapped until the last night it started to bleed.  He went back to scratch or feel the back of his head and he felt blood.  He is unsure why it started bleeding.  His wife did have to wrap the back of his head.  He denies any bleeding since then.  He has had a visual disturbance, but that technically started before the head injury.  He was seeing double and did see his eye doctor.  Since hitting his head he now seems to be seeing for of things at the time.  He states he can follow-up with his eye doctor.  His memory feels slightly worse since the fall, but that was a complaint at his last visit as well.  He has been more  fatigued, but does have chronic fatigue.  His wife is also been undergoing medical procedures so he has been doing more around the house in for her.  He has felt that his balance is slightly worse.  He is also had some slight lightheadedness.  He denies any fevers, chills, dizziness, chest pain, palpitations or nausea.   Medications and allergies reviewed with patient and updated if appropriate.  Patient Active Problem List   Diagnosis Date Noted  . Intracranial hemorrhage ([place]) 03/06/2018  . Fall   . Frequent bowel movements 02/15/2018  . Hair loss 02/15/2018  . Skin abnormalities 01/26/2018  . Chronic anticoagulation 12/21/2017  . Polypharmacy 12/21/2017  . Mid back pain on left side 08/10/2017  . Panic attack 08/09/2017  . COPD with acute exacerbation ([place]) 07/23/2017  . Spinal compression fracture ([place])   . Sleep apnea   . Rotator cuff tear, left   . Osteoarthritis   . On home oxygen therapy   . Hypoxia   . Fibromyalgia   . Fatty tumor   . Cataract   . BPH (benign prostatic hyperplasia)   . PAF (paroxysmal atrial fibrillation) ([place])   .  Insomnia 03/10/2017  . Chronic pain, legs and back 03/10/2017  . Hyperlipidemia 03/10/2017  . Obesity (BMI 30.0-34.9) 12/08/2016  . Depression 09/07/2016  . Osteoporosis 09/07/2016  . Hypothyroidism 03/08/2016  . GERD (gastroesophageal reflux disease) 03/08/2016  . Gout 03/08/2016  . Glaucoma 03/08/2016  . Type 2 diabetes mellitus ([place]) 03/08/2016  . Hereditary and idiopathic peripheral neuropathy 08/28/2015  . Memory disorder 08/28/2015  . Fatigue 01/08/2015  . Bruit 08/15/2013  . Essential hypertension 12/04/2011  . Allergic rhinitis, seasonal 12/04/2011  . Sleep apnea, obstructive 08/25/2011  . COPD (chronic obstructive pulmonary disease) ([place]) 07/18/2011    Current Outpatient Medications on File Prior to Visit  Medication Sig Dispense Refill  . acetaminophen (TYLENOL) 500 MG tablet Take 2 tablets (1,000 mg total) by mouth  every 8 (eight) hours as needed. (Patient taking differently: Take 1,000 mg by mouth 2 (two) times daily. ) 360 tablet 1  . allopurinol (ZYLOPRIM) 300 MG tablet TAKE 1 TABLET BY MOUTH  EVERY EVENING (Patient taking differently: Take 300 mg by mouth daily. ) 90 tablet 1  . apixaban (ELIQUIS) 5 MG TABS tablet Take 1 tablet (5 mg total) by mouth 2 (two) times daily. Hold through October 6. Resume October 7. 180 tablet 1  . atorvastatin (LIPITOR) 20 MG tablet TAKE 1 TABLET BY MOUTH  DAILY (Patient taking differently: Take 20 mg by mouth daily at 6 PM. ) 90 tablet 3  . Biotin 5000 MCG TABS Take 5,000 mcg by mouth daily.    . blood glucose meter kit and supplies KIT Dispense based on patient and insurance preference. Use up to four times daily as directed. (FOR E11.9). 1 each 0  . brimonidine (ALPHAGAN) 0.15 % ophthalmic solution Place 1 drop into the left eye 2 (two) times daily.     . calcium-vitamin D (OSCAL WITH D) 500-200 MG-UNIT tablet Take 1 tablet by mouth daily with breakfast.    . colchicine (COLCRYS) 0.6 MG tablet Take 0.6 mg by mouth daily as needed (flareups).     . diltiazem (CARDIZEM CD) 300 MG 24 hr capsule TAKE 1 CAPSULE DAILY BY  MOUTH. (Patient taking differently: Take 300 mg by mouth daily. ) 90 capsule 1  . doxazosin (CARDURA) 8 MG tablet TAKE 1 TABLET BY MOUTH  DAILY (Patient taking differently: Take 8 mg by mouth daily. ) 90 tablet 1  . DULoxetine (CYMBALTA) 30 MG capsule TAKE 2 CAPSULES BY MOUTH IN THE MORNING AND 1 CAPSULE  BY MOUTH IN THE EVENING (Patient taking differently: Take 30-60 mg by mouth See admin instructions. TAKE 2 CAPSULES BY MOUTH IN THE MORNING AND 1 CAPSULE  BY MOUTH IN THE EVENING) 270 capsule 1  . hydrochlorothiazide (HYDRODIURIL) 25 MG tablet TAKE 1 TABLET BY MOUTH  DAILY (Patient taking differently: Take 25 mg by mouth daily. ) 90 tablet 3  . latanoprost (XALATAN) 0.005 % ophthalmic solution Place 1 drop into both eyes at bedtime. 7.5 mL 3  . levalbuterol  (XOPENEX HFA) 45 MCG/ACT inhaler Inhale 1-2 puffs into the lungs every 4 (four) hours as needed for wheezing. 1 Inhaler 12  . levothyroxine (SYNTHROID, LEVOTHROID) 112 MCG tablet Take 1 tablet (112 mcg total) by mouth daily. 90 tablet 0  . magnesium oxide (MAG-OX) 400 MG tablet Take 400 mg by mouth daily.    . metFORMIN (GLUCOPHAGE) 500 MG tablet Take 1 tablet (500 mg total) by mouth 2 (two) times daily with a meal. 180 tablet 3  . mometasone (NASONEX) 50 MCG/ACT nasal  spray Place 2 sprays into the nose daily. 17 g 3  . Multiple Vitamin (MULTIVITAMIN) tablet Take 1 tablet by mouth daily.    . nitroGLYCERIN (NITROSTAT) 0.4 MG SL tablet Place 1 tablet (0.4 mg total) under the tongue every 5 (five) minutes as needed for chest pain. 90 tablet 3  . omeprazole (PRILOSEC) 20 MG capsule TAKE 1 CAPSULE BY MOUTH  DAILY (Patient taking differently: Take 20 mg by mouth daily. ) 90 capsule 1  . oxyCODONE-acetaminophen (PERCOCET) 10-325 MG tablet Take 1 tablet by mouth every 8 (eight) hours as needed for pain.    . potassium gluconate (EQL POTASSIUM GLUCONATE) 595 (99 K) MG TABS tablet Take 595 mg by mouth daily.    . tadalafil (CIALIS) 20 MG tablet Take 1 tablet (20 mg total) by mouth daily as needed. For erectile dysfunction 10 tablet 1  . Tiotropium Bromide-Olodaterol (STIOLTO RESPIMAT) 2.5-2.5 MCG/ACT AERS Inhale 2 puffs into the lungs daily. 2 Inhaler 0  . tiZANidine (ZANAFLEX) 4 MG capsule Take 1 capsule (4 mg total) by mouth at bedtime. 90 capsule 1  . vitamin C (ASCORBIC ACID) 500 MG tablet Take 1,000 mg by mouth daily.     Marland Kitchen VITAMIN D, CHOLECALCIFEROL, PO Take 50 mcg by mouth daily.    Marland Kitchen zolpidem (AMBIEN) 10 MG tablet Take 1 tablet (10 mg total) by mouth at bedtime as needed for sleep. 30 tablet 0   No current facility-administered medications on file prior to visit.     Past Medical History:  Diagnosis Date  . Atrial fibrillation ([facility])    a. paroxysmal, on Eliquis for anticoagulation  . BPH  (benign prostatic hyperplasia)   . Bronchitis   . Cataract   . COPD (chronic obstructive pulmonary disease) ([facility])    2 liters O2 HS  . Depression   . Fatty tumor    waste and back  . Fibromyalgia   . GERD (gastroesophageal reflux disease)   . Glaucoma    bilateral eyes  . Gout   . Hereditary and idiopathic peripheral neuropathy 08/28/2015  . Hypertension   . Hypothyroidism   . Hypoxia   . Insomnia   . Memory disorder 08/28/2015  . On home oxygen therapy    at night with cpap  . Osteoarthritis   . Osteoporosis   . Peripheral neuropathy   . Rotator cuff tear, left   . Sleep apnea    uses cpap-add oxygen at night  . Spinal compression fracture ([facility]) seventh vertebre  . Transient alteration of awareness 08/28/2015  . Type 2 diabetes mellitus ([place]) 03/08/2016  . Wears glasses     Past Surgical History:  Procedure Laterality Date  . APPENDECTOMY  age 88  . CARPAL TUNNEL RELEASE Right    early 2000s  . CATARACT EXTRACTION     bilateral  . CHOLECYSTECTOMY  age 16  . EYE SURGERY    . FOOT ARTHRODESIS Right 02/02/2013   Procedure: RIGHT HALLUX METATARSAL PHALANGEAL JOINT ARTHRODESIS ;  Surgeon: Wylene Simmer, MD;  Location: [facility];  Service: Orthopedics;  Laterality: Right;  . INGUINAL HERNIA REPAIR  age 52   rt side  . NASAL CONCHA BULLOSA RESECTION  age 63  . PROSTATE SURGERY    . SHOULDER ARTHROSCOPY W/ ROTATOR CUFF REPAIR Right    early 2000s  . tonsil    . VASECTOMY  age 65    Social History   Socioeconomic History  . Marital status: Married    Spouse name:  Not on file  . Number of children: Not on file  . Years of education: Not on file  . Highest education level: Not on file  Occupational History  . Occupation: RETIRED    Employer: RETIRED  Social Needs  . Financial resource strain: Not on file  . Food insecurity:    Worry: Not on file    Inability: Not on file  . Transportation needs:    Medical: Not on file    Non-medical: Not on file   Tobacco Use  . Smoking status: Former Smoker    Packs/day: 2.00    Years: 10.00    Pack years: 20.00    Types: Cigarettes    Last attempt to quit: 07/18/1975    Years since quitting: 42.6  . Smokeless tobacco: Never Used  Substance and Sexual Activity  . Alcohol use: Yes    Comment: 1-2 drinks/day  . Drug use: No  . Sexual activity: Not on file  Lifestyle  . Physical activity:    Days per week: Not on file    Minutes per session: Not on file  . Stress: Not on file  Relationships  . Social connections:    Talks on phone: Not on file    Gets together: Not on file    Attends religious service: Not on file    Active member of club or organization: Not on file    Attends meetings of clubs or organizations: Not on file    Relationship status: Not on file  Other Topics Concern  . Not on file  Social History Narrative  . Not on file    Family History  Problem Relation Age of Onset  . Coronary artery disease Brother   . Heart disease Father   . Lung cancer Father   . Kidney cancer Father   . Prostate cancer Father   . Arthritis Mother   . Lung cancer Mother     Review of Systems  Constitutional: Positive for fatigue. Negative for chills and fever.       Body temp flucuations  Eyes: Positive for visual disturbance (started before fall).  Cardiovascular: Negative for chest pain and palpitations.  Gastrointestinal: Negative for nausea.  Musculoskeletal: Positive for gait problem (slight loss of balance).  Neurological: Positive for light-headedness (slight) and headaches. Negative for dizziness.  Psychiatric/Behavioral:       More pronounced difficulty recalling words       Objective:   Vitals:   03/16/18 1031  BP: 130/64  Pulse: (!) 57  Resp: 18  Temp: 98.5 F (36.9 C)  SpO2: 96%   BP Readings from Last 3 Encounters:  03/16/18 130/64  03/07/18 140/71  03/02/18 (!) 144/80   Wt Readings from Last 3 Encounters:  03/16/18 199 lb (90.3 kg)  03/02/18 204 lb  0.6 oz (92.6 kg)  02/15/18 204 lb (92.5 kg)   Body mass index is 32.12 kg/m.   Physical Exam    Constitutional: Appears well-developed and well-nourished. No distress.  HENT:  Head: Posterior head with scalp swelling, superior abrasion has scabbed over and is minimally tender-no open wound, smaller abrasion below that is slightly open and there is surrounded moist blood and dried blood.  No active bleeding.  Slightly tender Neck: Neck supple. No tracheal deviation present. No thyromegaly present.  No cervical lymphadenopathy Cardiovascular: Normal rate, regular rhythm and normal heart sounds.   2/6 systolic murmur heard.  No edema Pulmonary/Chest: Effort normal and breath sounds normal. No respiratory distress. No  has no wheezes. No rales. Neurological: Nonfocal Skin: Skin is warm and dry. Not diaphoretic.  Psychiatric: Normal mood and affect. Behavior is normal.    CT HEAD WO CONTRAST CLINICAL DATA:  Fall 2 days ago with headaches, initial encounter  EXAM: CT HEAD WITHOUT CONTRAST  TECHNIQUE: Contiguous axial images were obtained from the base of the skull through the vertex without intravenous contrast.  COMPARISON:  03/06/2018  FINDINGS: Brain: Mild atrophic changes are noted. The right anterior parafalcine subdural hematoma is again identified and stable measuring approximately 6 mm. No new subdural hematoma is seen. No subarachnoid hemorrhage is noted. No infarct is seen.  Vascular: No hyperdense vessel or unexpected calcification.  Skull: Skull is within normal limits.  Sinuses/Orbits: No acute finding.  Other: Posterior midline scalp hematoma is again identified and stable.  IMPRESSION: Stable subdural along the falx on the right anteriorly  Persistent posterior scalp hematoma  No acute abnormality is noted.  Electronically Signed   By: Inez Catalina M.D.   On: 03/07/2018 15:10   Lab Results  Component Value Date   WBC 8.4 03/07/2018   HGB 13.1  03/07/2018   HCT 40.3 03/07/2018   PLT 133 (L) 03/07/2018   GLUCOSE 160 (H) 03/07/2018   CHOL 117 10/14/2017   TRIG 195.0 (H) 10/14/2017   HDL 39.60 10/14/2017   LDLCALC 38 10/14/2017   ALT 14 02/15/2018   AST 15 02/15/2018   NA 141 03/07/2018   K 3.0 (L) 03/07/2018   CL 106 03/07/2018   CREATININE 0.95 03/07/2018   BUN 16 03/07/2018   CO2 26 03/07/2018   TSH 6.87 (H) 02/15/2018   INR 1.12 03/06/2018   HGBA1C 7.1 (H) 02/15/2018   MICROALBUR 28.1 (H) 08/11/2017    Assessment & Plan:    See Problem List for Assessment and Plan of chronic medical problems.

## 2018-03-16 ENCOUNTER — Ambulatory Visit (INDEPENDENT_AMBULATORY_CARE_PROVIDER_SITE_OTHER): Payer: Medicare Other | Admitting: Internal Medicine

## 2018-03-16 ENCOUNTER — Encounter: Payer: Self-pay | Admitting: Internal Medicine

## 2018-03-16 VITALS — BP 130/64 | HR 57 | Temp 98.5°F | Resp 18 | Ht 66.0 in | Wt 199.0 lb

## 2018-03-16 DIAGNOSIS — I629 Nontraumatic intracranial hemorrhage, unspecified: Secondary | ICD-10-CM

## 2018-03-16 DIAGNOSIS — S0001XA Abrasion of scalp, initial encounter: Secondary | ICD-10-CM | POA: Diagnosis not present

## 2018-03-16 DIAGNOSIS — I480 Paroxysmal atrial fibrillation: Secondary | ICD-10-CM | POA: Diagnosis not present

## 2018-03-16 NOTE — Assessment & Plan Note (Signed)
Scalp abrasion posterior head from fall 9/22 resulting in subdural hematoma Did well and until last night he did have some bleeding from 1 of the abrasions-no current bleeding on exam, but there is some moist blood Head rewrapped Advised him to apply pressure if he does have any additional bleeding and to call if bleeding is recurrent Eliquis currently on hold for subdural hematoma

## 2018-03-16 NOTE — Assessment & Plan Note (Addendum)
Mechanical fall at home 9/22, hit his posterior head and head and anterior right parafalcine subdural hematoma.  Stable overnight Eliquis reversed and discontinued Will remain off Eliquis for 2 weeks-likely restart October 7-he will also discuss with cardiology Having headaches that Tylenol is treating, occasional sharp pain but only lasts a few seconds Mild bleeding last night from his abrasions on the back of his head, which stopped with pressure Discussed with him that he should call immediately if he has any increasing headaches or any other concerning symptoms

## 2018-03-16 NOTE — Patient Instructions (Addendum)
Continue local wound care as needed.  If you start bleeding again apply pressure and re-wrap the head.  Call if your bleeding does not stop.      Restart Eliquis on October 7 th.

## 2018-03-16 NOTE — Assessment & Plan Note (Signed)
Currently off Eliquis-discontinued 9/22 after mechanical fall and subdural hematoma Current plan is to restart after 2 weeks on October 7 Will discuss with cardiology

## 2018-03-17 ENCOUNTER — Other Ambulatory Visit: Payer: Self-pay | Admitting: Internal Medicine

## 2018-03-21 ENCOUNTER — Encounter: Payer: Self-pay | Admitting: Internal Medicine

## 2018-03-24 ENCOUNTER — Ambulatory Visit (INDEPENDENT_AMBULATORY_CARE_PROVIDER_SITE_OTHER): Payer: Medicare Other | Admitting: Physician Assistant

## 2018-03-24 ENCOUNTER — Encounter: Payer: Self-pay | Admitting: Physician Assistant

## 2018-03-24 VITALS — BP 128/72 | HR 58 | Ht 67.0 in | Wt 197.0 lb

## 2018-03-24 DIAGNOSIS — I10 Essential (primary) hypertension: Secondary | ICD-10-CM

## 2018-03-24 DIAGNOSIS — G4733 Obstructive sleep apnea (adult) (pediatric): Secondary | ICD-10-CM | POA: Diagnosis not present

## 2018-03-24 DIAGNOSIS — Z01810 Encounter for preprocedural cardiovascular examination: Secondary | ICD-10-CM | POA: Diagnosis not present

## 2018-03-24 DIAGNOSIS — E039 Hypothyroidism, unspecified: Secondary | ICD-10-CM | POA: Diagnosis not present

## 2018-03-24 DIAGNOSIS — E119 Type 2 diabetes mellitus without complications: Secondary | ICD-10-CM | POA: Diagnosis not present

## 2018-03-24 DIAGNOSIS — I480 Paroxysmal atrial fibrillation: Secondary | ICD-10-CM | POA: Diagnosis not present

## 2018-03-24 DIAGNOSIS — Z9989 Dependence on other enabling machines and devices: Secondary | ICD-10-CM | POA: Diagnosis not present

## 2018-03-24 NOTE — Progress Notes (Addendum)
Cardiology Office Note    Date:  03/24/2018   ID:  Jonathan [name], DOB 10-30-1932, MRN [id number]  PCP:  Binnie Rail, MD  Cardiologist:  Dr. Stanford Breed   Chief Complaint  Patient presents with  . Follow-up    seen for Dr. Stanford Breed.   . Pre-op Exam    Left lower back lipoma surgery by Dr. Johney Maine    History of Present Illness:  Jonathan [name] is a 84 y.o. male with PMH of PAF, HTN, hypothyroidism, COPD, BPH, DM II and OSA on CPAP.  Abdominal ultrasound in March 2015 showed no aneurysm.  Patient had a Myoview on 12/27/2014 that showed EF 56%, overall low risk study, normal perfusion. Last echocardiogram obtained 03/11/2017 showed EF 60 to 65%, grade 2 DD.  More recently, patient had a episode of dizziness, heart monitor was placed in July 2019 which did not show any significant pauses.  He was continue on the current medication.  More recently, patient was admitted to the hospital on 03/07/2018 after a mechanical fall after he lost control on the step while rolling his wife down a ramp.  He was found to have intracranial bleed and was monitored overnight.  He was seen by neurosurgery, Dr. Deri Fuelling recommended holding Eliquis for at least 2 weeks.  He may restart Eliquis starting on October 7.  Repeat head CT did not show any progression of intracranial bleed.  Patient presents today for cardiology office visit.  He has not restarted on the Eliquis at this point.  He does not have appointment with Dr. Irven [place] office.  I discussed the case with Dr. Stanford Breed, we recommend hold off on restarting the Eliquis until he can be seen by neurosurgery.  He does have upcoming lipoma surgery of the back.  Apparently he has a lipoma on his back that could potentially be compressing on the nerve which contributing to his back pain.  Overall, he does not have any prior history of CAD.  He is able to complete at least 4 METS of activity without any issue.  He is cleared to proceed with surgery from cardiology  perspective.  No further work-up is needed prior to the surgery.  Otherwise he has no lower extremity edema, orthopnea or PND.  He has no recent chest discomfort.  He can follow-up with Dr. Stanford Breed in 64-month  He says his primary care doctor is aware of the low potassium recently, he stopped his potassium supplement for a while after running out of medication and was recently restarted on it 2 weeks ago, I will defer follow-up on the potassium level to Dr. BQuay Burow   Past Medical History:  Diagnosis Date  . Atrial fibrillation (HCC)    a. paroxysmal, on Eliquis for anticoagulation  . BPH (benign prostatic hyperplasia)   . Bronchitis   . Cataract   . COPD (chronic obstructive pulmonary disease) (HCC)    2 liters O2 HS  . Depression   . Fatty tumor    waste and back  . Fibromyalgia   . GERD (gastroesophageal reflux disease)   . Glaucoma    bilateral eyes  . Gout   . Hereditary and idiopathic peripheral neuropathy 08/28/2015  . Hypertension   . Hypothyroidism   . Hypoxia   . Insomnia   . Memory disorder 08/28/2015  . On home oxygen therapy    at night with cpap  . Osteoarthritis   . Osteoporosis   . Peripheral neuropathy   . Rotator cuff tear, left   .  Sleep apnea    uses cpap-add oxygen at night  . Spinal compression fracture (HCC) seventh vertebre  . Transient alteration of awareness 08/28/2015  . Type 2 diabetes mellitus ([place]) 03/08/2016  . Wears glasses     Past Surgical History:  Procedure Laterality Date  . APPENDECTOMY  age 5  . CARPAL TUNNEL RELEASE Right    early 2000s  . CATARACT EXTRACTION     bilateral  . CHOLECYSTECTOMY  age 60  . EYE SURGERY    . FOOT ARTHRODESIS Right 02/02/2013   Procedure: RIGHT HALLUX METATARSAL PHALANGEAL JOINT ARTHRODESIS ;  Surgeon: Wylene Simmer, MD;  Location: [facility];  Service: Orthopedics;  Laterality: Right;  . INGUINAL HERNIA REPAIR  age 74   rt side  . NASAL CONCHA BULLOSA RESECTION  age 36  . PROSTATE SURGERY     . SHOULDER ARTHROSCOPY W/ ROTATOR CUFF REPAIR Right    early 2000s  . tonsil    . VASECTOMY  age 24    Current Medications: Outpatient Medications Prior to Visit  Medication Sig Dispense Refill  . acetaminophen (TYLENOL) 500 MG tablet Take 2 tablets (1,000 mg total) by mouth every 8 (eight) hours as needed. (Patient taking differently: Take 1,000 mg by mouth 2 (two) times daily. ) 360 tablet 1  . allopurinol (ZYLOPRIM) 300 MG tablet TAKE 1 TABLET BY MOUTH  EVERY EVENING (Patient taking differently: Take 300 mg by mouth daily. ) 90 tablet 1  . atorvastatin (LIPITOR) 20 MG tablet TAKE 1 TABLET BY MOUTH  DAILY (Patient taking differently: Take 20 mg by mouth daily at 6 PM. ) 90 tablet 3  . Biotin 5000 MCG TABS Take 5,000 mcg by mouth daily.    . blood glucose meter kit and supplies KIT Dispense based on patient and insurance preference. Use up to four times daily as directed. (FOR E11.9). 1 each 0  . brimonidine (ALPHAGAN) 0.15 % ophthalmic solution Place 1 drop into the left eye 2 (two) times daily.     . calcium-vitamin D (OSCAL WITH D) 500-200 MG-UNIT tablet Take 1 tablet by mouth daily with breakfast.    . colchicine (COLCRYS) 0.6 MG tablet Take 0.6 mg by mouth daily as needed (flareups).     . diltiazem (CARDIZEM CD) 300 MG 24 hr capsule TAKE 1 CAPSULE DAILY BY  MOUTH. (Patient taking differently: Take 300 mg by mouth daily. ) 90 capsule 1  . doxazosin (CARDURA) 8 MG tablet TAKE 1 TABLET BY MOUTH  DAILY (Patient taking differently: Take 8 mg by mouth daily. ) 90 tablet 1  . DULoxetine (CYMBALTA) 30 MG capsule TAKE 2 CAPSULES BY MOUTH IN THE MORNING AND 1 CAPSULE  BY MOUTH IN THE EVENING (Patient taking differently: Take 30-60 mg by mouth See admin instructions. TAKE 2 CAPSULES BY MOUTH IN THE MORNING AND 1 CAPSULE  BY MOUTH IN THE EVENING) 270 capsule 1  . hydrochlorothiazide (HYDRODIURIL) 25 MG tablet TAKE 1 TABLET BY MOUTH  DAILY (Patient taking differently: Take 25 mg by mouth daily. )  90 tablet 3  . latanoprost (XALATAN) 0.005 % ophthalmic solution Place 1 drop into both eyes at bedtime. 7.5 mL 3  . levalbuterol (XOPENEX HFA) 45 MCG/ACT inhaler Inhale 1-2 puffs into the lungs every 4 (four) hours as needed for wheezing. 1 Inhaler 12  . levothyroxine (SYNTHROID, LEVOTHROID) 112 MCG tablet Take 1 tablet (112 mcg total) by mouth daily. 90 tablet 0  . magnesium oxide (MAG-OX) 400 MG tablet Take 400  mg by mouth daily.    . metFORMIN (GLUCOPHAGE) 500 MG tablet Take 1 tablet (500 mg total) by mouth 2 (two) times daily with a meal. 180 tablet 3  . mometasone (NASONEX) 50 MCG/ACT nasal spray Place 2 sprays into the nose daily. 17 g 3  . Multiple Vitamin (MULTIVITAMIN) tablet Take 1 tablet by mouth daily.    . nitroGLYCERIN (NITROSTAT) 0.4 MG SL tablet Place 1 tablet (0.4 mg total) under the tongue every 5 (five) minutes as needed for chest pain. 90 tablet 3  . omeprazole (PRILOSEC) 20 MG capsule TAKE 1 CAPSULE BY MOUTH  DAILY (Patient taking differently: Take 20 mg by mouth daily. ) 90 capsule 1  . oxyCODONE-acetaminophen (PERCOCET) 10-325 MG tablet Take 1 tablet by mouth every 8 (eight) hours as needed for pain.    . potassium gluconate (EQL POTASSIUM GLUCONATE) 595 (99 K) MG TABS tablet Take 595 mg by mouth daily.    . tadalafil (CIALIS) 20 MG tablet Take 1 tablet (20 mg total) by mouth daily as needed. For erectile dysfunction 10 tablet 1  . Tiotropium Bromide-Olodaterol (STIOLTO RESPIMAT) 2.5-2.5 MCG/ACT AERS Inhale 2 puffs into the lungs daily. 2 Inhaler 0  . tiZANidine (ZANAFLEX) 4 MG capsule Take 1 capsule (4 mg total) by mouth at bedtime. 90 capsule 1  . vitamin C (ASCORBIC ACID) 500 MG tablet Take 1,000 mg by mouth daily.     Marland Kitchen VITAMIN D, CHOLECALCIFEROL, PO Take 50 mcg by mouth daily.    Marland Kitchen zolpidem (AMBIEN) 10 MG tablet Take 1 tablet (10 mg total) by mouth at bedtime as needed for sleep. 30 tablet 0  . apixaban (ELIQUIS) 5 MG TABS tablet Take 1 tablet (5 mg total) by mouth 2  (two) times daily. Hold through October 6. Resume October 7. (Patient not taking: Reported on 03/24/2018) 180 tablet 1   No facility-administered medications prior to visit.      Allergies:   Other   Social History   Socioeconomic History  . Marital status: Married    Spouse name: Not on file  . Number of children: Not on file  . Years of education: Not on file  . Highest education level: Not on file  Occupational History  . Occupation: RETIRED    Employer: RETIRED  Social Needs  . Financial resource strain: Not on file  . Food insecurity:    Worry: Not on file    Inability: Not on file  . Transportation needs:    Medical: Not on file    Non-medical: Not on file  Tobacco Use  . Smoking status: Former Smoker    Packs/day: 2.00    Years: 10.00    Pack years: 20.00    Types: Cigarettes    Last attempt to quit: 07/18/1975    Years since quitting: 42.7  . Smokeless tobacco: Never Used  Substance and Sexual Activity  . Alcohol use: Yes    Comment: 1-2 drinks/day  . Drug use: No  . Sexual activity: Not on file  Lifestyle  . Physical activity:    Days per week: Not on file    Minutes per session: Not on file  . Stress: Not on file  Relationships  . Social connections:    Talks on phone: Not on file    Gets together: Not on file    Attends religious service: Not on file    Active member of club or organization: Not on file    Attends meetings of clubs or  organizations: Not on file    Relationship status: Not on file  Other Topics Concern  . Not on file  Social History Narrative  . Not on file     Family History:  The patient's \ family history includes Arthritis in his mother; Coronary artery disease in his brother; Heart disease in his father; Kidney cancer in his father; Lung cancer in his father and mother; Prostate cancer in his father.   ROS:   Please see the history of present illness.    ROS All other systems reviewed and are negative.   PHYSICAL EXAM:     VS:  BP 128/72 (BP Location: Right Arm, Patient Position: Sitting, Cuff Size: Normal)   Pulse (!) 58   Ht _0  (1.702 m)   Wt 197 lb (89.4 kg)   SpO2 98%   BMI 30.85 kg/m    GEN: Well nourished, well developed, in no acute distress  HEENT: normal  Neck: no JVD, carotid bruits, or masses Cardiac: RRR; no murmurs, rubs, or gallops,no edema  Respiratory:  clear to auscultation bilaterally, normal work of breathing GI: soft, nontender, nondistended, + BS MS: no deformity or atrophy  Skin: warm and dry, no rash Neuro:  Alert and Oriented x 3, Strength and sensation are intact Psych: euthymic mood, full affect  Wt Readings from Last 3 Encounters:  03/24/18 197 lb (89.4 kg)  03/16/18 199 lb (90.3 kg)  03/02/18 204 lb 0.6 oz (92.6 kg)      Studies/Labs Reviewed:   EKG:  EKG is ordered today.  The ekg ordered today demonstrates normal sinus rhythm without significant ST-T wave changes.  Poor R wave progression in anterior leads which has been unchanged since at least 2016  Recent Labs: 02/15/2018: ALT 14; TSH 6.87 03/07/2018: BUN 16; Creatinine, Ser 0.95; Hemoglobin 13.1; Magnesium 2.2; Platelets 133; Potassium 3.0; Sodium 141   Lipid Panel    Component Value Date/Time   CHOL 117 10/14/2017 1037   TRIG 195.0 (H) 10/14/2017 1037   HDL 39.60 10/14/2017 1037   CHOLHDL 3 10/14/2017 1037   VLDL 39.0 10/14/2017 1037   LDLCALC 38 10/14/2017 1037    Additional studies/ records that were reviewed today include:   Echo 02/08/2017 - Left ventricle: The cavity size was normal. Systolic function was   normal. The estimated ejection fraction was in the range of 60%   to 65%. Wall motion was normal; there were no regional wall   motion abnormalities. Features are consistent with a pseudonormal   left ventricular filling pattern, with concomitant abnormal   relaxation and increased filling pressure (grade 2 diastolic   dysfunction). - Left atrium: The atrium was mildly dilated. - Atrial  septum: There was increased thickness of the septum,   consistent with lipomatous hypertrophy. - Tricuspid valve: There was trivial regurgitation. - Pulmonary arteries: Systolic pressure could not be accurately   estimated.    ASSESSMENT:    1. Preop cardiovascular exam   2. PAF (paroxysmal atrial fibrillation) ([place])   3. Essential hypertension   4. Hypothyroidism, unspecified type   5. Controlled type 2 diabetes mellitus without complication, without long-term current use of insulin ([place])   6. OSA on CPAP      PLAN:  In order of problems listed above:  1. Preoperative clearance: Patient is able to complete at least 4 METS of activity without any issue.  His perioperative RCRI risk is class II, 0.9% risk of major cardiac event.  He is cleared to proceed  with the surgery without any further work-up.  I have informed Dr. Stanford Breed regarding this clearance.  Last Myoview was in 2016 which was normal.  Patient does not have any prior history of CAD.  There has been no recent chest pain or exertional dyspnea either.  2. PAF: Currently off Eliquis, he was supposed to restart Eliquis on 03/21/2018, he has not done so.  Given the recent subdural hematoma, I instructed the patient to continue to hold Eliquis until cleared by neurosurgery on follow-up.  The case has been discussed with Dr. Stanford Breed today.  3. Subdural hematoma: Occurred after a mechanical fall while missing a step while pushing on his wife's wheelchair.  He fell backward onto his head.  He does have some dried scab on the occipital side of the head.  Recent CT showed a 6 mm subdural hematoma.  This was stable on repeat image.  He will need to follow-up with neurosurgery as outpatient before restarted on Eliquis.  He has been having some mild headache, however no ipsilateral weakness on physical exam.  Muscle strength equal bilaterally.  4. Hypertension: Blood pressure stable  5. Hypothyroidism: On Synthroid, managed by primary care  provider  6. DM2: On metformin, managed by primary care provider.    Medication Adjustments/Labs and Tests Ordered: Current medicines are reviewed at length with the patient today.  Concerns regarding medicines are outlined above.  Medication changes, Labs and Tests ordered today are listed in the Patient Instructions below. Patient Instructions  Medication Instructions:  Your physician recommends that you continue on your current medications as directed. Please refer to the Current Medication list given to you today.  If you need a refill on your cardiac medications before your next appointment, please call your pharmacy.   Lab work: None  If you have labs (blood work) drawn today and your tests are completely normal, you will receive your results only by: Marland Kitchen MyChart Message (if you have MyChart) OR . A paper copy in the mail If you have any lab test that is abnormal or we need to change your treatment, we will call you to review the results.  Testing/Procedures: None   Follow-Up: At [facility], you and your health needs are our priority.  As part of our continuing mission to provide you with exceptional heart care, we have created designated Provider Care Teams.  These Care Teams include your primary Cardiologist (physician) and Advanced Practice Providers (APPs -  Physician Assistants and Nurse Practitioners) who all work together to provide you with the care you need, when you need it. Follow up in 3 months with Dr Stanford Breed.  Any Other Special Instructions Will Be Listed Below (If Applicable). You are cleared for surgery. Follow up with neurosurgery Dr Deri Fuelling    Signed, Almyra Deforest, [place]  03/24/2018 10:14 AM    [address], [place], [place]  [address] Phone: [phone]; Fax: [phone]

## 2018-03-24 NOTE — Patient Instructions (Addendum)
Medication Instructions:  Your physician recommends that you continue on your current medications as directed. Please refer to the Current Medication list given to you today.  If you need a refill on your cardiac medications before your next appointment, please call your pharmacy.   Lab work: None  If you have labs (blood work) drawn today and your tests are completely normal, you will receive your results only by: Marland Kitchen MyChart Message (if you have MyChart) OR . A paper copy in the mail If you have any lab test that is abnormal or we need to change your treatment, we will call you to review the results.  Testing/Procedures: None   Follow-Up: At [facility], you and your health needs are our priority.  As part of our continuing mission to provide you with exceptional heart care, we have created designated Provider Care Teams.  These Care Teams include your primary Cardiologist (physician) and Advanced Practice Providers (APPs -  Physician Assistants and Nurse Practitioners) who all work together to provide you with the care you need, when you need it. Follow up in 3 months with Dr Stanford Breed.  Any Other Special Instructions Will Be Listed Below (If Applicable). You are cleared for surgery. Follow up with neurosurgery Dr Deri Fuelling

## 2018-04-02 ENCOUNTER — Other Ambulatory Visit: Payer: Self-pay | Admitting: Internal Medicine

## 2018-04-05 DIAGNOSIS — M545 Low back pain: Secondary | ICD-10-CM | POA: Diagnosis not present

## 2018-04-05 DIAGNOSIS — M47896 Other spondylosis, lumbar region: Secondary | ICD-10-CM | POA: Diagnosis not present

## 2018-04-06 DIAGNOSIS — M19041 Primary osteoarthritis, right hand: Secondary | ICD-10-CM | POA: Diagnosis not present

## 2018-04-07 DIAGNOSIS — S065X9A Traumatic subdural hemorrhage with loss of consciousness of unspecified duration, initial encounter: Secondary | ICD-10-CM | POA: Diagnosis not present

## 2018-04-07 DIAGNOSIS — S065XAA Traumatic subdural hemorrhage with loss of consciousness status unknown, initial encounter: Secondary | ICD-10-CM | POA: Insufficient documentation

## 2018-04-11 DIAGNOSIS — M545 Low back pain: Secondary | ICD-10-CM | POA: Diagnosis not present

## 2018-04-11 DIAGNOSIS — M47896 Other spondylosis, lumbar region: Secondary | ICD-10-CM | POA: Diagnosis not present

## 2018-04-12 ENCOUNTER — Other Ambulatory Visit: Payer: Self-pay | Admitting: Neurosurgery

## 2018-04-12 DIAGNOSIS — S065X9A Traumatic subdural hemorrhage with loss of consciousness of unspecified duration, initial encounter: Secondary | ICD-10-CM

## 2018-04-12 DIAGNOSIS — S065XAA Traumatic subdural hemorrhage with loss of consciousness status unknown, initial encounter: Secondary | ICD-10-CM

## 2018-04-15 ENCOUNTER — Encounter: Payer: Self-pay | Admitting: Internal Medicine

## 2018-04-18 ENCOUNTER — Ambulatory Visit (INDEPENDENT_AMBULATORY_CARE_PROVIDER_SITE_OTHER): Payer: Medicare Other | Admitting: Internal Medicine

## 2018-04-18 ENCOUNTER — Encounter: Payer: Self-pay | Admitting: Internal Medicine

## 2018-04-18 ENCOUNTER — Ambulatory Visit: Payer: Medicare Other | Admitting: Neurology

## 2018-04-18 VITALS — BP 130/62 | HR 65 | Temp 98.2°F | Resp 16 | Ht 67.0 in | Wt 200.1 lb

## 2018-04-18 DIAGNOSIS — S0100XD Unspecified open wound of scalp, subsequent encounter: Secondary | ICD-10-CM | POA: Diagnosis not present

## 2018-04-18 DIAGNOSIS — S0100XA Unspecified open wound of scalp, initial encounter: Secondary | ICD-10-CM | POA: Insufficient documentation

## 2018-04-18 DIAGNOSIS — M47896 Other spondylosis, lumbar region: Secondary | ICD-10-CM | POA: Diagnosis not present

## 2018-04-18 DIAGNOSIS — M545 Low back pain: Secondary | ICD-10-CM | POA: Diagnosis not present

## 2018-04-18 MED ORDER — ZOLPIDEM TARTRATE 10 MG PO TABS: 10.0000 mg | ORAL_TABLET | Freq: Every evening | ORAL | 0 refills | Status: DC | PRN

## 2018-04-18 MED ORDER — CEPHALEXIN 500 MG PO CAPS: 500.0000 mg | ORAL_CAPSULE | Freq: Three times a day (TID) | ORAL | 0 refills | Status: DC

## 2018-04-18 NOTE — Progress Notes (Signed)
Subjective:    Patient ID: Jonathan [name], male    DOB: 29-Nov-1932, 84 y.o.   MRN: [id number]  HPI The patient is here for an acute visit.   Head wound:  His wound is scabbed over, but still oozing blood tinged clear fluid.  The area is tender when he lays down and puts pressure on it.  He has occasional shooting pain throughout his head.  His wife has been watching the wound and denies any significant change.  He tries to avoid putting pressure on the area, but it is impossible.  He has not had any fevers or chills.  He was concerned that this wound has not been healing.  He has not noticed any pus discharge.  For a while he was putting antibacterial ointment on the area, but has not done that recently since it did not seem to be helping.  Medications and allergies reviewed with patient and updated if appropriate.  Patient Active Problem List   Diagnosis Date Noted  . Scalp abrasion, non-infected 03/16/2018  . Intracranial hemorrhage ([place]) 03/06/2018  . Fall   . Frequent bowel movements 02/15/2018  . Hair loss 02/15/2018  . Skin abnormalities 01/26/2018  . Chronic anticoagulation 12/21/2017  . Polypharmacy 12/21/2017  . Mid back pain on left side 08/10/2017  . Panic attack 08/09/2017  . COPD with acute exacerbation ([place]) 07/23/2017  . Spinal compression fracture ([place])   . Sleep apnea   . Rotator cuff tear, left   . Osteoarthritis   . On home oxygen therapy   . Hypoxia   . Fibromyalgia   . Fatty tumor   . Cataract   . BPH (benign prostatic hyperplasia)   . PAF (paroxysmal atrial fibrillation) ([place])   . Insomnia 03/10/2017  . Chronic pain, legs and back 03/10/2017  . Hyperlipidemia 03/10/2017  . Obesity (BMI 30.0-34.9) 12/08/2016  . Depression 09/07/2016  . Osteoporosis 09/07/2016  . Hypothyroidism 03/08/2016  . GERD (gastroesophageal reflux disease) 03/08/2016  . Gout 03/08/2016  . Glaucoma 03/08/2016  . Type 2 diabetes mellitus ([place]) 03/08/2016  . Hereditary and  idiopathic peripheral neuropathy 08/28/2015  . Memory disorder 08/28/2015  . Fatigue 01/08/2015  . Bruit 08/15/2013  . Essential hypertension 12/04/2011  . Allergic rhinitis, seasonal 12/04/2011  . Sleep apnea, obstructive 08/25/2011  . COPD (chronic obstructive pulmonary disease) ([place]) 07/18/2011    Current Outpatient Medications on File Prior to Visit  Medication Sig Dispense Refill  . acetaminophen (TYLENOL) 500 MG tablet Take 2 tablets (1,000 mg total) by mouth every 8 (eight) hours as needed. (Patient taking differently: Take 1,000 mg by mouth 2 (two) times daily. ) 360 tablet 1  . allopurinol (ZYLOPRIM) 300 MG tablet TAKE 1 TABLET BY MOUTH  EVERY EVENING 90 tablet 0  . apixaban (ELIQUIS) 5 MG TABS tablet Take 1 tablet (5 mg total) by mouth 2 (two) times daily. Hold through October 6. Resume October 7. 180 tablet 1  . atorvastatin (LIPITOR) 20 MG tablet TAKE 1 TABLET BY MOUTH  DAILY (Patient taking differently: Take 20 mg by mouth daily at 6 PM. ) 90 tablet 3  . Biotin 5000 MCG TABS Take 5,000 mcg by mouth daily.    . blood glucose meter kit and supplies KIT Dispense based on patient and insurance preference. Use up to four times daily as directed. (FOR E11.9). 1 each 0  . brimonidine (ALPHAGAN) 0.15 % ophthalmic solution Place 1 drop into the left eye 2 (two) times daily.     Marland Kitchen  calcium-vitamin D (OSCAL WITH D) 500-200 MG-UNIT tablet Take 1 tablet by mouth daily with breakfast.    . colchicine (COLCRYS) 0.6 MG tablet Take 0.6 mg by mouth daily as needed (flareups).     . diltiazem (CARDIZEM CD) 300 MG 24 hr capsule TAKE 1 CAPSULE DAILY BY  MOUTH. (Patient taking differently: Take 300 mg by mouth daily. ) 90 capsule 1  . doxazosin (CARDURA) 8 MG tablet TAKE 1 TABLET BY MOUTH  DAILY (Patient taking differently: Take 8 mg by mouth daily. ) 90 tablet 1  . DULoxetine (CYMBALTA) 30 MG capsule TAKE 2 CAPSULES BY MOUTH IN THE MORNING AND 1 CAPSULE  BY MOUTH IN THE EVENING (Patient taking  differently: Take 30-60 mg by mouth See admin instructions. TAKE 2 CAPSULES BY MOUTH IN THE MORNING AND 1 CAPSULE  BY MOUTH IN THE EVENING) 270 capsule 1  . hydrochlorothiazide (HYDRODIURIL) 25 MG tablet TAKE 1 TABLET BY MOUTH  DAILY (Patient taking differently: Take 25 mg by mouth daily. ) 90 tablet 3  . latanoprost (XALATAN) 0.005 % ophthalmic solution Place 1 drop into both eyes at bedtime. 7.5 mL 3  . levalbuterol (XOPENEX HFA) 45 MCG/ACT inhaler Inhale 1-2 puffs into the lungs every 4 (four) hours as needed for wheezing. 1 Inhaler 12  . levothyroxine (SYNTHROID, LEVOTHROID) 100 MCG tablet TAKE 1 TABLET BY MOUTH  DAILY 90 tablet 0  . levothyroxine (SYNTHROID, LEVOTHROID) 112 MCG tablet Take 1 tablet (112 mcg total) by mouth daily. 90 tablet 0  . magnesium oxide (MAG-OX) 400 MG tablet Take 400 mg by mouth daily.    . metFORMIN (GLUCOPHAGE) 500 MG tablet Take 1 tablet (500 mg total) by mouth 2 (two) times daily with a meal. 180 tablet 3  . mometasone (NASONEX) 50 MCG/ACT nasal spray Place 2 sprays into the nose daily. 17 g 3  . Multiple Vitamin (MULTIVITAMIN) tablet Take 1 tablet by mouth daily.    . nitroGLYCERIN (NITROSTAT) 0.4 MG SL tablet Place 1 tablet (0.4 mg total) under the tongue every 5 (five) minutes as needed for chest pain. 90 tablet 3  . omeprazole (PRILOSEC) 20 MG capsule TAKE 1 CAPSULE BY MOUTH  DAILY (Patient taking differently: Take 20 mg by mouth daily. ) 90 capsule 1  . oxyCODONE-acetaminophen (PERCOCET) 10-325 MG tablet Take 1 tablet by mouth every 8 (eight) hours as needed for pain.    . potassium gluconate (EQL POTASSIUM GLUCONATE) 595 (99 K) MG TABS tablet Take 595 mg by mouth daily.    . tadalafil (CIALIS) 20 MG tablet Take 1 tablet (20 mg total) by mouth daily as needed. For erectile dysfunction 10 tablet 1  . Tiotropium Bromide-Olodaterol (STIOLTO RESPIMAT) 2.5-2.5 MCG/ACT AERS Inhale 2 puffs into the lungs daily. 2 Inhaler 0  . tiZANidine (ZANAFLEX) 4 MG capsule Take 1  capsule (4 mg total) by mouth at bedtime. 90 capsule 1  . vitamin C (ASCORBIC ACID) 500 MG tablet Take 1,000 mg by mouth daily.     Marland Kitchen VITAMIN D, CHOLECALCIFEROL, PO Take 50 mcg by mouth daily.    Marland Kitchen zolpidem (AMBIEN) 10 MG tablet Take 1 tablet (10 mg total) by mouth at bedtime as needed for sleep. 30 tablet 0   No current facility-administered medications on file prior to visit.     Past Medical History:  Diagnosis Date  . Atrial fibrillation ([facility])    a. paroxysmal, on Eliquis for anticoagulation  . BPH (benign prostatic hyperplasia)   . Bronchitis   . Cataract   .  COPD (chronic obstructive pulmonary disease) ([facility])    2 liters O2 HS  . Depression   . Fatty tumor    waste and back  . Fibromyalgia   . GERD (gastroesophageal reflux disease)   . Glaucoma    bilateral eyes  . Gout   . Hereditary and idiopathic peripheral neuropathy 08/28/2015  . Hypertension   . Hypothyroidism   . Hypoxia   . Insomnia   . Memory disorder 08/28/2015  . On home oxygen therapy    at night with cpap  . Osteoarthritis   . Osteoporosis   . Peripheral neuropathy   . Rotator cuff tear, left   . Sleep apnea    uses cpap-add oxygen at night  . Spinal compression fracture ([facility]) seventh vertebre  . Transient alteration of awareness 08/28/2015  . Type 2 diabetes mellitus ([place]) 03/08/2016  . Wears glasses     Past Surgical History:  Procedure Laterality Date  . APPENDECTOMY  age 39  . CARPAL TUNNEL RELEASE Right    early 2000s  . CATARACT EXTRACTION     bilateral  . CHOLECYSTECTOMY  age 43  . EYE SURGERY    . FOOT ARTHRODESIS Right 02/02/2013   Procedure: RIGHT HALLUX METATARSAL PHALANGEAL JOINT ARTHRODESIS ;  Surgeon: Wylene Simmer, MD;  Location: [facility];  Service: Orthopedics;  Laterality: Right;  . INGUINAL HERNIA REPAIR  age 68   rt side  . NASAL CONCHA BULLOSA RESECTION  age 43  . PROSTATE SURGERY    . SHOULDER ARTHROSCOPY W/ ROTATOR CUFF REPAIR Right    early 2000s  .  tonsil    . VASECTOMY  age 75    Social History   Socioeconomic History  . Marital status: Married    Spouse name: Not on file  . Number of children: Not on file  . Years of education: Not on file  . Highest education level: Not on file  Occupational History  . Occupation: RETIRED    Employer: RETIRED  Social Needs  . Financial resource strain: Not on file  . Food insecurity:    Worry: Not on file    Inability: Not on file  . Transportation needs:    Medical: Not on file    Non-medical: Not on file  Tobacco Use  . Smoking status: Former Smoker    Packs/day: 2.00    Years: 10.00    Pack years: 20.00    Types: Cigarettes    Last attempt to quit: 07/18/1975    Years since quitting: 42.7  . Smokeless tobacco: Never Used  Substance and Sexual Activity  . Alcohol use: Yes    Comment: 1-2 drinks/day  . Drug use: No  . Sexual activity: Not on file  Lifestyle  . Physical activity:    Days per week: Not on file    Minutes per session: Not on file  . Stress: Not on file  Relationships  . Social connections:    Talks on phone: Not on file    Gets together: Not on file    Attends religious service: Not on file    Active member of club or organization: Not on file    Attends meetings of clubs or organizations: Not on file    Relationship status: Not on file  Other Topics Concern  . Not on file  Social History Narrative  . Not on file    Family History  Problem Relation Age of Onset  . Coronary artery disease Brother   .  Heart disease Father   . Lung cancer Father   . Kidney cancer Father   . Prostate cancer Father   . Arthritis Mother   . Lung cancer Mother     Review of Systems  Constitutional: Negative for chills and fever.  Eyes: Negative for visual disturbance.  Gastrointestinal: Negative for nausea.  Neurological: Positive for headaches (occ shooting pain in head - 2-5 times a day, no change). Negative for dizziness and light-headedness.         Objective:   Vitals:   04/18/18 1404  BP: 130/62  Pulse: 65  Resp: 16  Temp: 98.2 F (36.8 C)  SpO2: 96%   BP Readings from Last 3 Encounters:  04/18/18 130/62  03/24/18 128/72  03/16/18 130/64   Wt Readings from Last 3 Encounters:  04/18/18 200 lb 1.9 oz (90.8 kg)  03/24/18 197 lb (89.4 kg)  03/16/18 199 lb (90.3 kg)   Body mass index is 31.34 kg/m.   Physical Exam  Constitutional: He is oriented to person, place, and time. He appears well-developed and well-nourished. No distress.  HENT:  Head: Normocephalic.  Posterior mid head with area of matted hair with blood, mild scabbing.  No active bleeding or discharge Surrounding this area there is mild swelling and erythema that is tender to palpation After area was cleaned there was a small wound/laceration that does not seem to have healed-?  Mild pus  Neurological: He is alert and oriented to person, place, and time. No cranial nerve deficit.  Skin: Skin is warm and dry.           Assessment & Plan:    See Problem List for Assessment and Plan of chronic medical problems.

## 2018-04-18 NOTE — Patient Instructions (Addendum)
Take the antibiotic as prescribed.  Use the Bactroban ointment twice daily.   After completing the above you can try Medi-honey if it is still not healed to help with healing.     Call or return if no improvement.

## 2018-04-18 NOTE — Assessment & Plan Note (Signed)
Mild posterior head wound from fall more than 1 month ago Slow healing Serosanguineous discharge,?  Small amount of pus and wound-no active discharge ?  Mild infection preventing healing Start Keflex 3 times daily x7 days Bactroban ointment twice daily Can try meta honey after completes above If wound does not heal may need to be referred to the wound center

## 2018-04-19 ENCOUNTER — Encounter: Payer: Self-pay | Admitting: Internal Medicine

## 2018-04-19 DIAGNOSIS — L03311 Cellulitis of abdominal wall: Secondary | ICD-10-CM

## 2018-04-19 DIAGNOSIS — Z8614 Personal history of Methicillin resistant Staphylococcus aureus infection: Secondary | ICD-10-CM

## 2018-04-19 MED ORDER — MUPIROCIN CALCIUM 2 % EX CREA: 1.0000 "application " | TOPICAL_CREAM | Freq: Two times a day (BID) | CUTANEOUS | 0 refills | Status: DC | PRN

## 2018-04-19 NOTE — Progress Notes (Addendum)
Subjective:   Jonathan [name] is a 84 y.o. male who presents for Medicare Annual/Subsequent preventive examination.  Review of Systems:  No ROS.  Medicare Wellness Visit. Additional risk factors are reflected in the social history.  Cardiac Risk Factors include: advanced age (>75mn, >>90women);diabetes mellitus;dyslipidemia;male gender;hypertension Sleep patterns: feels rested on waking, gets up 1-2 times nightly to void and sleeps 7-8 hours nightly. Takes sleep medication nightly.  Home Safety/Smoke Alarms: Feels safe in home. Smoke alarms in place.  Living environment; residence and Firearm Safety: 2-story house, no firearms. Lives with wife, no needs for DME, good support system Seat Belt Safety/Bike Helmet: Wears seat belt.     Objective:    Vitals: BP 138/62   Pulse 65   Resp 17   Ht _0  (1.702 m)   Wt 198 lb (89.8 kg)   SpO2 99%   BMI 31.01 kg/m   Body mass index is 31.01 kg/m.  Advanced Directives 04/20/2018 03/07/2018 04/19/2017 03/07/2016 08/28/2015 01/26/2013 07/20/2011  Does Patient Have a Medical Advance Directive? _1  Patient has advance directive, copy not in chart Patient does not have advance directive  Type of Advance Directive [place] will Living will [place] will - - [place] will -  Does patient want to make changes to medical advance directive? - No - Patient declined - - - - -  Copy of HH[place]ardin Chart? No - copy requested - No - copy requested No - copy requested No - copy requested - -  Would patient like information on creating a medical advance directive? - No - Patient declined - - - - -  Pre-existing out of facility DNR order (yellow form or pink MOST form) - - - - - - No    Tobacco Social History   Tobacco Use  Smoking Status Former Smoker  . Packs/day: 2.00  . Years: 10.00  . Pack years: 20.00  . Types: Cigarettes  . Last  attempt to quit: 07/18/1975  . Years since quitting: 42.7  Smokeless Tobacco Never Used     Counseling given: Not Answered  Past Medical History:  Diagnosis Date  . Atrial fibrillation (HCC)    a. paroxysmal, on Eliquis for anticoagulation  . BPH (benign prostatic hyperplasia)   . Bronchitis   . Cataract   . COPD (chronic obstructive pulmonary disease) (HCC)    2 liters O2 HS  . Depression   . Fatty tumor    waste and back  . Fibromyalgia   . GERD (gastroesophageal reflux disease)   . Glaucoma    bilateral eyes  . Gout   . Hereditary and idiopathic peripheral neuropathy 08/28/2015  . Hypertension   . Hypothyroidism   . Hypoxia   . Insomnia   . Memory disorder 08/28/2015  . On home oxygen therapy    at night with cpap  . Osteoarthritis   . Osteoporosis   . Peripheral neuropathy   . Rotator cuff tear, left   . Sleep apnea    uses cpap-add oxygen at night  . Spinal compression fracture (HCC) seventh vertebre  . Transient alteration of awareness 08/28/2015  . Type 2 diabetes mellitus ([place] 03/08/2016  . Wears glasses    Past Surgical History:  Procedure Laterality Date  . APPENDECTOMY  age 89 . CARPAL TUNNEL RELEASE Right    early 2000s  . CATARACT EXTRACTION     bilateral  .  CHOLECYSTECTOMY  age 83  . EYE SURGERY    . FOOT ARTHRODESIS Right 02/02/2013   Procedure: RIGHT HALLUX METATARSAL PHALANGEAL JOINT ARTHRODESIS ;  Surgeon: Wylene Simmer, MD;  Location: [facility];  Service: Orthopedics;  Laterality: Right;  . INGUINAL HERNIA REPAIR  age 25   rt side  . NASAL CONCHA BULLOSA RESECTION  age 73  . PROSTATE SURGERY    . SHOULDER ARTHROSCOPY W/ ROTATOR CUFF REPAIR Right    early 2000s  . tonsil    . VASECTOMY  age 83   Family History  Problem Relation Age of Onset  . Coronary artery disease Brother   . Heart disease Father   . Lung cancer Father   . Kidney cancer Father   . Prostate cancer Father   . Arthritis Mother   . Lung cancer Mother     Social History   Socioeconomic History  . Marital status: Married    Spouse name: Not on file  . Number of children: 2  . Years of education: Not on file  . Highest education level: Not on file  Occupational History  . Occupation: RETIRED    Employer: RETIRED  Social Needs  . Financial resource strain: Somewhat hard  . Food insecurity:    Worry: Never true    Inability: Never true  . Transportation needs:    Medical: No    Non-medical: No  Tobacco Use  . Smoking status: Former Smoker    Packs/day: 2.00    Years: 10.00    Pack years: 20.00    Types: Cigarettes    Last attempt to quit: 07/18/1975    Years since quitting: 42.7  . Smokeless tobacco: Never Used  Substance and Sexual Activity  . Alcohol use: Yes    Comment: 1-2 drinks/day  . Drug use: No  . Sexual activity: Not Currently  Lifestyle  . Physical activity:    Days per week: 0 days    Minutes per session: 0 min  . Stress: Not at all  Relationships  . Social connections:    Talks on phone: More than three times a week    Gets together: More than three times a week    Attends religious service: Never    Active member of club or organization: Yes    Attends meetings of clubs or organizations: More than 4 times per year    Relationship status: Married  Other Topics Concern  . Not on file  Social History Narrative  . Not on file    Outpatient Encounter Medications as of 04/20/2018  Medication Sig  . acetaminophen (TYLENOL) 500 MG tablet Take 2 tablets (1,000 mg total) by mouth every 8 (eight) hours as needed. (Patient taking differently: Take 1,000 mg by mouth 2 (two) times daily. )  . allopurinol (ZYLOPRIM) 300 MG tablet TAKE 1 TABLET BY MOUTH  EVERY EVENING  . apixaban (ELIQUIS) 5 MG TABS tablet Take 1 tablet (5 mg total) by mouth 2 (two) times daily. Hold through October 6. Resume October 7.  . atorvastatin (LIPITOR) 20 MG tablet TAKE 1 TABLET BY MOUTH  DAILY (Patient taking differently: Take 20 mg by  mouth daily at 6 PM. )  . Biotin 5000 MCG TABS Take 5,000 mcg by mouth daily.  . blood glucose meter kit and supplies KIT Dispense based on patient and insurance preference. Use up to four times daily as directed. (FOR E11.9).  . brimonidine (ALPHAGAN) 0.15 % ophthalmic solution Place 1 drop into  the left eye 2 (two) times daily.   . calcium-vitamin D (OSCAL WITH D) 500-200 MG-UNIT tablet Take 1 tablet by mouth daily with breakfast.  . cephALEXin (KEFLEX) 500 MG capsule Take 1 capsule (500 mg total) by mouth 3 (three) times daily.  . colchicine (COLCRYS) 0.6 MG tablet Take 0.6 mg by mouth daily as needed (flareups).   . diltiazem (CARDIZEM CD) 300 MG 24 hr capsule TAKE 1 CAPSULE DAILY BY  MOUTH. (Patient taking differently: Take 300 mg by mouth daily. )  . doxazosin (CARDURA) 8 MG tablet TAKE 1 TABLET BY MOUTH  DAILY (Patient taking differently: Take 8 mg by mouth daily. )  . DULoxetine (CYMBALTA) 30 MG capsule TAKE 2 CAPSULES BY MOUTH IN THE MORNING AND 1 CAPSULE  BY MOUTH IN THE EVENING (Patient taking differently: Take 30-60 mg by mouth See admin instructions. TAKE 2 CAPSULES BY MOUTH IN THE MORNING AND 1 CAPSULE  BY MOUTH IN THE EVENING)  . hydrochlorothiazide (HYDRODIURIL) 25 MG tablet TAKE 1 TABLET BY MOUTH  DAILY (Patient taking differently: Take 25 mg by mouth daily. )  . latanoprost (XALATAN) 0.005 % ophthalmic solution Place 1 drop into both eyes at bedtime.  . levalbuterol (XOPENEX HFA) 45 MCG/ACT inhaler Inhale 1-2 puffs into the lungs every 4 (four) hours as needed for wheezing.  Marland Kitchen levothyroxine (SYNTHROID, LEVOTHROID) 100 MCG tablet TAKE 1 TABLET BY MOUTH  DAILY  . levothyroxine (SYNTHROID, LEVOTHROID) 112 MCG tablet Take 1 tablet (112 mcg total) by mouth daily.  . magnesium oxide (MAG-OX) 400 MG tablet Take 400 mg by mouth daily.  . metFORMIN (GLUCOPHAGE) 500 MG tablet Take 1 tablet (500 mg total) by mouth 2 (two) times daily with a meal.  . mometasone (NASONEX) 50 MCG/ACT nasal spray  Place 2 sprays into the nose daily.  . Multiple Vitamin (MULTIVITAMIN) tablet Take 1 tablet by mouth daily.  . mupirocin cream (BACTROBAN) 2 % Apply 1 application topically 2 (two) times daily as needed.  . nitroGLYCERIN (NITROSTAT) 0.4 MG SL tablet Place 1 tablet (0.4 mg total) under the tongue every 5 (five) minutes as needed for chest pain.  Marland Kitchen omeprazole (PRILOSEC) 20 MG capsule TAKE 1 CAPSULE BY MOUTH  DAILY (Patient taking differently: Take 20 mg by mouth daily. )  . oxyCODONE-acetaminophen (PERCOCET) 10-325 MG tablet Take 1 tablet by mouth every 8 (eight) hours as needed for pain.  . potassium gluconate (EQL POTASSIUM GLUCONATE) 595 (99 K) MG TABS tablet Take 595 mg by mouth daily.  . tadalafil (CIALIS) 20 MG tablet Take 1 tablet (20 mg total) by mouth daily as needed. For erectile dysfunction  . Tiotropium Bromide-Olodaterol (STIOLTO RESPIMAT) 2.5-2.5 MCG/ACT AERS Inhale 2 puffs into the lungs daily.  Marland Kitchen tiZANidine (ZANAFLEX) 4 MG capsule Take 1 capsule (4 mg total) by mouth at bedtime.  . vitamin C (ASCORBIC ACID) 500 MG tablet Take 1,000 mg by mouth daily.   Marland Kitchen VITAMIN D, CHOLECALCIFEROL, PO Take 50 mcg by mouth daily.  Marland Kitchen zolpidem (AMBIEN) 10 MG tablet Take 1 tablet (10 mg total) by mouth at bedtime as needed for sleep.   No facility-administered encounter medications on file as of 04/20/2018.     Activities of Daily Living In your present state of health, do you have any difficulty performing the following activities: 04/20/2018 03/07/2018  Hearing? N N  Vision? N N  Difficulty concentrating or making decisions? Y N  Comment states he cannot recall what he reads and has poor STM -  Walking  or climbing stairs? N N  Dressing or bathing? N N  Doing errands, shopping? N N  Preparing Food and eating ? N -  Using the Toilet? N -  In the past six months, have you accidently leaked urine? N -  Do you have problems with loss of bowel control? N -  Managing your Medications? N -  Managing  your Finances? N -  Housekeeping or managing your Housekeeping? N -  Some recent data might be hidden    Patient Care Team: Binnie Rail, MD as PCP - General (Internal Medicine) Stanford Breed Denice Bors, MD as PCP - Cardiology (Cardiology) Council Mechanic, MD as Referring Physician (Family Medicine) Earnie Larsson, MD as Consulting Physician (Neurosurgery) Stanford Breed Denice Bors, MD as Consulting Physician (Cardiology) Michael Boston, MD as Consulting Physician (General Surgery)   Assessment:   This is a routine wellness examination for [place]. Physical assessment deferred to PCP.   Exercise Activities and Dietary recommendations Current Exercise Habits: The patient does not participate in regular exercise at present, Exercise limited by: orthopedic condition(s) Diet (meal preparation, eat out, water intake, caffeinated beverages, dairy products, fruits and vegetables): in general, a "healthy" diet     Reviewed heart healthy and diabetic diet. Encouraged patient to increase daily water and healthy fluid intake.  Goals    . Cut out extra servings     Do not keep chips and cookies in the house, start to walk again and perhaps hire a coach at the gym.    Marland Kitchen lose weight     Not having certain foods in the house, start to walk again, and I may work with a coach at [employer].    . Patient Stated     Increase my physical activity by walking more often.       Fall Risk Fall Risk  04/20/2018 04/19/2017 01/14/2017 01/12/2017 09/07/2016  Falls in the past year? 1 No No No No  Number falls in past yr: 0 - - - -  Injury with Fall? 1 - - - -  Risk for fall due to : Impaired balance/gait - - - -  Follow up Falls prevention discussed - - - -    Depression Screen PHQ 2/9 Scores 04/20/2018 04/19/2017 01/14/2017 01/12/2017  PHQ - 2 Score 4 2 0 0  PHQ- 9 Score 9 8 - -    Cognitive Function MMSE - Mini Mental State Exam 04/20/2018 04/19/2017 08/28/2015  Not completed: - Refused -  Orientation to time 5 - 5   Orientation to Place 5 - 5  Registration 3 - 3  Attention/ Calculation 5 - 5  Recall 2 - 2  Language- name 2 objects 2 - 2  Language- repeat 1 - 1  Language- follow 3 step command 3 - 3  Language- read & follow direction 1 - 1  Write a sentence 1 - 1  Copy design 1 - 1  Total score 29 - 29        Immunization History  Administered Date(s) Administered  . Hepatitis A 07/25/2007, 01/22/2008  . Hepatitis B 04/30/1988, 06/02/1988, 10/28/1988  . IPV 05/15/1996  . Influenza Split 02/19/2012, 03/13/2016  . Influenza Whole 02/17/2011, 03/13/2013  . Influenza, High Dose Seasonal PF 03/10/2017, 02/15/2018  . Influenza,inj,Quad PF,6+ Mos 03/15/2015  . Meningococcal Polysaccharide 05/15/1996  . Pneumococcal Conjugate-13 12/08/2016  . Pneumococcal-Unspecified 02/01/2006  . Td 01/14/1995  . Tdap 03/06/2018  . Zoster 02/01/2006  . Zoster Recombinat (Shingrix) 01/15/2017, 01/04/2018  Screening Tests Health Maintenance  Topic Date Due  . OPHTHALMOLOGY EXAM  06/07/1943  . FOOT EXAM  04/19/2018  . DEXA SCAN  10/15/2019  . TETANUS/TDAP  03/06/2028  . INFLUENZA VACCINE  Completed  . PNA vac Low Risk Adult  Completed       Plan:     Continue doing brain stimulating activities (puzzles, reading, adult coloring books, staying active) to keep memory sharp.   Continue to eat heart healthy diet (full of fruits, vegetables, whole grains, lean protein, water--limit salt, fat, and sugar intake) and increase physical activity as tolerated.  I have personally reviewed and noted the following in the patient's chart:   . Medical and social history . Use of alcohol, tobacco or illicit drugs  . Current medications and supplements . Functional ability and status . Nutritional status . Physical activity . Advanced directives . List of other physicians . Vitals . Screenings to include cognitive, depression, and falls . Referrals and appointments  In addition, I have reviewed and discussed  with patient certain preventive protocols, quality metrics, and best practice recommendations. A written personalized care plan for preventive services as well as general preventive health recommendations were provided to patient.     Michiel Cowboy, RN  04/20/2018    Medical screening examination/treatment/procedure(s) were performed by non-physician practitioner and as supervising physician I was immediately available for consultation/collaboration. I agree with above. Binnie Rail, MD

## 2018-04-19 NOTE — Telephone Encounter (Signed)
Ok to refill 

## 2018-04-19 NOTE — Telephone Encounter (Signed)
Pls advise if ok to refill../lmb 

## 2018-04-20 ENCOUNTER — Ambulatory Visit (INDEPENDENT_AMBULATORY_CARE_PROVIDER_SITE_OTHER): Payer: Medicare Other | Admitting: *Deleted

## 2018-04-20 VITALS — BP 138/62 | HR 65 | Resp 17 | Ht 67.0 in | Wt 198.0 lb

## 2018-04-20 DIAGNOSIS — Z0000 Encounter for general adult medical examination without abnormal findings: Secondary | ICD-10-CM

## 2018-04-20 DIAGNOSIS — E119 Type 2 diabetes mellitus without complications: Secondary | ICD-10-CM

## 2018-04-20 NOTE — Patient Instructions (Addendum)
Continue doing brain stimulating activities (puzzles, reading, adult coloring books, staying active) to keep memory sharp.   Continue to eat heart healthy diet (full of fruits, vegetables, whole grains, lean protein, water--limit salt, fat, and sugar intake) and increase physical activity as tolerated.   Jonathan [name] , Thank you for taking time to come for your Medicare Wellness Visit. I appreciate your ongoing commitment to your health goals. Please review the following plan we discussed and let me know if I can assist you in the future.   These are the goals we discussed: Goals    . Cut out extra servings     Do not keep chips and cookies in the house, start to walk again and perhaps hire a coach at the gym.    Marland Kitchen lose weight     Not having certain foods in the house, start to walk again, and I may work with a coach at [employer].    . Patient Stated     Increase my physical activity by walking more often.       This is a list of the screening recommended for you and due dates:  Health Maintenance  Topic Date Due  . Eye exam for diabetics  06/07/1943  . Complete foot exam   04/19/2018  . DEXA scan (bone density measurement)  10/15/2019  . Tetanus Vaccine  03/06/2028  . Flu Shot  Completed  . Pneumonia vaccines  Completed     Jonathan [name] , Thank you for taking time to come for your Medicare Wellness Visit. I appreciate your ongoing commitment to your health goals. Please review the following plan we discussed and let me know if I can assist you in the future.   These are the goals we discussed: Goals    . Cut out extra servings     Do not keep chips and cookies in the house, start to walk again and perhaps hire a coach at the gym.    Marland Kitchen lose weight     Not having certain foods in the house, start to walk again, and I may work with a coach at [employer].    . Patient Stated     Increase my physical activity by walking more often.       This is a list of the screening recommended  for you and due dates:  Health Maintenance  Topic Date Due  . Eye exam for diabetics  06/07/1943  . Complete foot exam   04/19/2018  . DEXA scan (bone density measurement)  10/15/2019  . Tetanus Vaccine  03/06/2028  . Flu Shot  Completed  . Pneumonia vaccines  Completed    Health Maintenance, Male A healthy lifestyle and preventive care is important for your health and wellness. Ask your health care provider about what schedule of regular examinations is right for you. What should I know about weight and diet? Eat a Healthy Diet  Eat plenty of vegetables, fruits, whole grains, low-fat dairy products, and lean protein.  Do not eat a lot of foods high in solid fats, added sugars, or salt.  Maintain a Healthy Weight Regular exercise can help you achieve or maintain a healthy weight. You should:  Do at least 150 minutes of exercise each week. The exercise should increase your heart rate and make you sweat (moderate-intensity exercise).  Do strength-training exercises at least twice a week.  Watch Your Levels of Cholesterol and Blood Lipids  Have your blood tested for lipids and  cholesterol every 5 years starting at 82 years of age. If you are at high risk for heart disease, you should start having your blood tested when you are 82 years old. You may need to have your cholesterol levels checked more often if: ? Your lipid or cholesterol levels are high. ? You are older than 82 years of age. ? You are at high risk for heart disease.  What should I know about cancer screening? Many types of cancers can be detected early and may often be prevented. Lung Cancer  You should be screened every year for lung cancer if: ? You are a current smoker who has smoked for at least 30 years. ? You are a former smoker who has quit within the past 15 years.  Talk to your health care provider about your screening options, when you should start screening, and how often you should be  screened.  Colorectal Cancer  Routine colorectal cancer screening usually begins at 82 years of age and should be repeated every 5-10 years until you are 81 years old. You may need to be screened more often if early forms of precancerous polyps or small growths are found. Your health care provider may recommend screening at an earlier age if you have risk factors for colon cancer.  Your health care provider may recommend using home test kits to check for hidden blood in the stool.  A small camera at the end of a tube can be used to examine your colon (sigmoidoscopy or colonoscopy). This checks for the earliest forms of colorectal cancer.  Prostate and Testicular Cancer  Depending on your age and overall health, your health care provider may do certain tests to screen for prostate and testicular cancer.  Talk to your health care provider about any symptoms or concerns you have about testicular or prostate cancer.  Skin Cancer  Check your skin from head to toe regularly.  Tell your health care provider about any new moles or changes in moles, especially if: ? There is a change in a mole's size, shape, or color. ? You have a mole that is larger than a pencil eraser.  Always use sunscreen. Apply sunscreen liberally and repeat throughout the day.  Protect yourself by wearing long sleeves, pants, a wide-brimmed hat, and sunglasses when outside.  What should I know about heart disease, diabetes, and high blood pressure?  If you are 30-50 years of age, have your blood pressure checked every 3-5 years. If you are 38 years of age or older, have your blood pressure checked every year. You should have your blood pressure measured twice-once when you are at a hospital or clinic, and once when you are not at a hospital or clinic. Record the average of the two measurements. To check your blood pressure when you are not at a hospital or clinic, you can use: ? An automated blood pressure machine at a  pharmacy. ? A home blood pressure monitor.  Talk to your health care provider about your target blood pressure.  If you are between 74-22 years old, ask your health care provider if you should take aspirin to prevent heart disease.  Have regular diabetes screenings by checking your fasting blood sugar level. ? If you are at a normal weight and have a low risk for diabetes, have this test once every three years after the age of 5. ? If you are overweight and have a high risk for diabetes, consider being tested at a younger age  or more often.  A one-time screening for abdominal aortic aneurysm (AAA) by ultrasound is recommended for men aged 31-75 years who are current or former smokers. What should I know about preventing infection? Hepatitis B If you have a higher risk for hepatitis B, you should be screened for this virus. Talk with your health care provider to find out if you are at risk for hepatitis B infection. Hepatitis C Blood testing is recommended for:  Everyone born from [birth year] through 1963.  Anyone with known risk factors for hepatitis C.  Sexually Transmitted Diseases (STDs)  You should be screened each year for STDs including gonorrhea and chlamydia if: ? You are sexually active and are younger than 82 years of age. ? You are older than 82 years of age and your health care provider tells you that you are at risk for this type of infection. ? Your sexual activity has changed since you were last screened and you are at an increased risk for chlamydia or gonorrhea. Ask your health care provider if you are at risk.  Talk with your health care provider about whether you are at high risk of being infected with HIV. Your health care provider may recommend a prescription medicine to help prevent HIV infection.  What else can I do?  Schedule regular health, dental, and eye exams.  Stay current with your vaccines (immunizations).  Do not use any tobacco products, such as  cigarettes, chewing tobacco, and e-cigarettes. If you need help quitting, ask your health care provider.  Limit alcohol intake to no more than 2 drinks per day. One drink equals 12 ounces of beer, 5 ounces of Melah Ebling, or 1 ounces of hard liquor.  Do not use street drugs.  Do not share needles.  Ask your health care provider for help if you need support or information about quitting drugs.  Tell your health care provider if you often feel depressed.  Tell your health care provider if you have ever been abused or do not feel safe at home. This information is not intended to replace advice given to you by your health care provider. Make sure you discuss any questions you have with your health care provider. Document Released: 11/28/2007 Document Revised: 01/29/2016 Document Reviewed: 03/05/2015 Elsevier Interactive Patient Education  [employer].

## 2018-04-21 ENCOUNTER — Ambulatory Visit
Admission: RE | Admit: 2018-04-21 | Discharge: 2018-04-21 | Disposition: A | Discharge status: Home / Self Care | Payer: Medicare Other | Source: Ambulatory Visit | Attending: Neurosurgery | Admitting: Neurosurgery

## 2018-04-21 DIAGNOSIS — S065X9A Traumatic subdural hemorrhage with loss of consciousness of unspecified duration, initial encounter: Secondary | ICD-10-CM

## 2018-04-21 DIAGNOSIS — R030 Elevated blood-pressure reading, without diagnosis of hypertension: Secondary | ICD-10-CM | POA: Diagnosis not present

## 2018-04-21 DIAGNOSIS — Z6832 Body mass index (BMI) 32.0-32.9, adult: Secondary | ICD-10-CM | POA: Diagnosis not present

## 2018-04-21 DIAGNOSIS — S065XAA Traumatic subdural hemorrhage with loss of consciousness status unknown, initial encounter: Secondary | ICD-10-CM

## 2018-04-21 DIAGNOSIS — S06360D Traumatic hemorrhage of cerebrum, unspecified, without loss of consciousness, subsequent encounter: Secondary | ICD-10-CM | POA: Diagnosis not present

## 2018-04-22 DIAGNOSIS — M47896 Other spondylosis, lumbar region: Secondary | ICD-10-CM | POA: Diagnosis not present

## 2018-04-22 DIAGNOSIS — M545 Low back pain: Secondary | ICD-10-CM | POA: Diagnosis not present

## 2018-05-10 DIAGNOSIS — Z9981 Dependence on supplemental oxygen: Secondary | ICD-10-CM | POA: Diagnosis not present

## 2018-05-10 DIAGNOSIS — R42 Dizziness and giddiness: Secondary | ICD-10-CM | POA: Diagnosis not present

## 2018-05-10 DIAGNOSIS — N200 Calculus of kidney: Secondary | ICD-10-CM | POA: Diagnosis not present

## 2018-05-10 DIAGNOSIS — E039 Hypothyroidism, unspecified: Secondary | ICD-10-CM | POA: Diagnosis not present

## 2018-05-10 DIAGNOSIS — I4891 Unspecified atrial fibrillation: Secondary | ICD-10-CM | POA: Diagnosis not present

## 2018-05-10 DIAGNOSIS — J449 Chronic obstructive pulmonary disease, unspecified: Secondary | ICD-10-CM | POA: Diagnosis not present

## 2018-05-10 DIAGNOSIS — E7800 Pure hypercholesterolemia, unspecified: Secondary | ICD-10-CM | POA: Diagnosis not present

## 2018-05-10 DIAGNOSIS — G629 Polyneuropathy, unspecified: Secondary | ICD-10-CM | POA: Diagnosis not present

## 2018-05-10 DIAGNOSIS — N400 Enlarged prostate without lower urinary tract symptoms: Secondary | ICD-10-CM | POA: Diagnosis not present

## 2018-05-10 DIAGNOSIS — M797 Fibromyalgia: Secondary | ICD-10-CM | POA: Diagnosis not present

## 2018-05-10 DIAGNOSIS — R410 Disorientation, unspecified: Secondary | ICD-10-CM | POA: Diagnosis not present

## 2018-05-10 DIAGNOSIS — F0781 Postconcussional syndrome: Secondary | ICD-10-CM | POA: Diagnosis not present

## 2018-05-10 DIAGNOSIS — I10 Essential (primary) hypertension: Secondary | ICD-10-CM | POA: Diagnosis not present

## 2018-05-10 DIAGNOSIS — Z9049 Acquired absence of other specified parts of digestive tract: Secondary | ICD-10-CM | POA: Diagnosis not present

## 2018-05-10 DIAGNOSIS — N390 Urinary tract infection, site not specified: Secondary | ICD-10-CM | POA: Diagnosis not present

## 2018-05-10 DIAGNOSIS — M109 Gout, unspecified: Secondary | ICD-10-CM | POA: Diagnosis not present

## 2018-05-10 DIAGNOSIS — F329 Major depressive disorder, single episode, unspecified: Secondary | ICD-10-CM | POA: Diagnosis not present

## 2018-05-10 DIAGNOSIS — Z6831 Body mass index (BMI) 31.0-31.9, adult: Secondary | ICD-10-CM | POA: Diagnosis not present

## 2018-05-17 NOTE — Progress Notes (Signed)
Subjective:    Patient ID: Jonathan [name], male    DOB: [birth year], 84 y.o.   MRN: [id number]  HPI The patient is here for follow up.  UTI:  He was in [place] over thanksgiving visiting his daughter.  He had a UTI and went to the ED.  He was confused and had significant fatigue. He slept for hours.  His family took him to the ED.  He had a bladder Cath and was given IVF.  He does not think he was given any antibiotics in the emergency room.  He started to feel better in the ED.  They discharged him home on Macrobid for 10 days - he has 2 more days left.   He feel 3000 times better.    He currently denies any fevers, chills, change in appetite, difficulty urinating, dysuria, hematuria, abdominal pain, nausea, new back pain compared to chronic back pain and confusion.  He does have some urinary frequency, but this is not completely new.  He does see a urologist, but it has been a while since he was there and probably needs to follow-up.  He does take Cardura daily.  He does sometimes have difficulty urinating, which she knows is related to his prostate.  Medications and allergies reviewed with patient and updated if appropriate.  Patient Active Problem List   Diagnosis Date Noted  . Open wnd of scalp 04/18/2018  . Scalp abrasion, non-infected 03/16/2018  . Intracranial hemorrhage ([facility]) 03/06/2018  . Fall   . Frequent bowel movements 02/15/2018  . Hair loss 02/15/2018  . Skin abnormalities 01/26/2018  . Chronic anticoagulation 12/21/2017  . Polypharmacy 12/21/2017  . Mid back pain on left side 08/10/2017  . Panic attack 08/09/2017  . COPD with acute exacerbation ([place]) 07/23/2017  . Spinal compression fracture ([place])   . Sleep apnea   . Rotator cuff tear, left   . Osteoarthritis   . On home oxygen therapy   . Hypoxia   . Fibromyalgia   . Fatty tumor   . Cataract   . BPH (benign prostatic hyperplasia)   . PAF (paroxysmal atrial fibrillation) ([place])   . Insomnia 03/10/2017  .  Chronic pain, legs and back 03/10/2017  . Hyperlipidemia 03/10/2017  . Obesity (BMI 30.0-34.9) 12/08/2016  . Depression 09/07/2016  . Osteoporosis 09/07/2016  . Hypothyroidism 03/08/2016  . GERD (gastroesophageal reflux disease) 03/08/2016  . Gout 03/08/2016  . Glaucoma 03/08/2016  . Type 2 diabetes mellitus ([place]) 03/08/2016  . Hereditary and idiopathic peripheral neuropathy 08/28/2015  . Memory disorder 08/28/2015  . Fatigue 01/08/2015  . Bruit 08/15/2013  . Essential hypertension 12/04/2011  . Allergic rhinitis, seasonal 12/04/2011  . Sleep apnea, obstructive 08/25/2011  . COPD (chronic obstructive pulmonary disease) ([place]) 07/18/2011    Current Outpatient Medications on File Prior to Visit  Medication Sig Dispense Refill  . acetaminophen (TYLENOL) 500 MG tablet Take 2 tablets (1,000 mg total) by mouth every 8 (eight) hours as needed. (Patient taking differently: Take 1,000 mg by mouth 2 (two) times daily. ) 360 tablet 1  . allopurinol (ZYLOPRIM) 300 MG tablet TAKE 1 TABLET BY MOUTH  EVERY EVENING 90 tablet 0  . apixaban (ELIQUIS) 5 MG TABS tablet Take 1 tablet (5 mg total) by mouth 2 (two) times daily. Hold through October 6. Resume October 7. 180 tablet 1  . atorvastatin (LIPITOR) 20 MG tablet TAKE 1 TABLET BY MOUTH  DAILY (Patient taking differently: Take 20 mg by mouth daily at 6 PM. )  90 tablet 3  . Biotin 5000 MCG TABS Take 5,000 mcg by mouth daily.    . blood glucose meter kit and supplies KIT Dispense based on patient and insurance preference. Use up to four times daily as directed. (FOR E11.9). 1 each 0  . brimonidine (ALPHAGAN) 0.15 % ophthalmic solution Place 1 drop into the left eye 2 (two) times daily.     . calcium-vitamin D (OSCAL WITH D) 500-200 MG-UNIT tablet Take 1 tablet by mouth daily with breakfast.    . cephALEXin (KEFLEX) 500 MG capsule Take 1 capsule (500 mg total) by mouth 3 (three) times daily. 21 capsule 0  . colchicine (COLCRYS) 0.6 MG tablet Take 0.6 mg by  mouth daily as needed (flareups).     . diltiazem (CARDIZEM CD) 300 MG 24 hr capsule TAKE 1 CAPSULE DAILY BY  MOUTH. (Patient taking differently: Take 300 mg by mouth daily. ) 90 capsule 1  . doxazosin (CARDURA) 8 MG tablet TAKE 1 TABLET BY MOUTH  DAILY (Patient taking differently: Take 8 mg by mouth daily. ) 90 tablet 1  . DULoxetine (CYMBALTA) 30 MG capsule TAKE 2 CAPSULES BY MOUTH IN THE MORNING AND 1 CAPSULE  BY MOUTH IN THE EVENING (Patient taking differently: Take 30-60 mg by mouth See admin instructions. TAKE 2 CAPSULES BY MOUTH IN THE MORNING AND 1 CAPSULE  BY MOUTH IN THE EVENING) 270 capsule 1  . hydrochlorothiazide (HYDRODIURIL) 25 MG tablet TAKE 1 TABLET BY MOUTH  DAILY (Patient taking differently: Take 25 mg by mouth daily. ) 90 tablet 3  . latanoprost (XALATAN) 0.005 % ophthalmic solution Place 1 drop into both eyes at bedtime. 7.5 mL 3  . levalbuterol (XOPENEX HFA) 45 MCG/ACT inhaler Inhale 1-2 puffs into the lungs every 4 (four) hours as needed for wheezing. 1 Inhaler 12  . levothyroxine (SYNTHROID, LEVOTHROID) 100 MCG tablet TAKE 1 TABLET BY MOUTH  DAILY 90 tablet 0  . levothyroxine (SYNTHROID, LEVOTHROID) 112 MCG tablet Take 1 tablet (112 mcg total) by mouth daily. 90 tablet 0  . magnesium oxide (MAG-OX) 400 MG tablet Take 400 mg by mouth daily.    . metFORMIN (GLUCOPHAGE) 500 MG tablet Take 1 tablet (500 mg total) by mouth 2 (two) times daily with a meal. 180 tablet 3  . mometasone (NASONEX) 50 MCG/ACT nasal spray Place 2 sprays into the nose daily. 17 g 3  . Multiple Vitamin (MULTIVITAMIN) tablet Take 1 tablet by mouth daily.    . mupirocin cream (BACTROBAN) 2 % Apply 1 application topically 2 (two) times daily as needed. 15 g 0  . nitrofurantoin, macrocrystal-monohydrate, (MACROBID) 100 MG capsule Take 100 mg by mouth 2 (two) times daily.    . nitroGLYCERIN (NITROSTAT) 0.4 MG SL tablet Place 1 tablet (0.4 mg total) under the tongue every 5 (five) minutes as needed for chest pain.  90 tablet 3  . omeprazole (PRILOSEC) 20 MG capsule TAKE 1 CAPSULE BY MOUTH  DAILY (Patient taking differently: Take 20 mg by mouth daily. ) 90 capsule 1  . oxyCODONE-acetaminophen (PERCOCET) 10-325 MG tablet Take 1 tablet by mouth every 8 (eight) hours as needed for pain.    . potassium gluconate (EQL POTASSIUM GLUCONATE) 595 (99 K) MG TABS tablet Take 595 mg by mouth daily.    . tadalafil (CIALIS) 20 MG tablet Take 1 tablet (20 mg total) by mouth daily as needed. For erectile dysfunction 10 tablet 1  . Tiotropium Bromide-Olodaterol (STIOLTO RESPIMAT) 2.5-2.5 MCG/ACT AERS Inhale 2 puffs into  the lungs daily. 2 Inhaler 0  . tiZANidine (ZANAFLEX) 4 MG capsule Take 1 capsule (4 mg total) by mouth at bedtime. 90 capsule 1  . vitamin C (ASCORBIC ACID) 500 MG tablet Take 1,000 mg by mouth daily.     Marland Kitchen VITAMIN D, CHOLECALCIFEROL, PO Take 50 mcg by mouth daily.    Marland Kitchen zolpidem (AMBIEN) 10 MG tablet Take 1 tablet (10 mg total) by mouth at bedtime as needed for sleep. 30 tablet 0   No current facility-administered medications on file prior to visit.     Past Medical History:  Diagnosis Date  . Atrial fibrillation ([facility])    a. paroxysmal, on Eliquis for anticoagulation  . BPH (benign prostatic hyperplasia)   . Bronchitis   . Cataract   . COPD (chronic obstructive pulmonary disease) ([facility])    2 liters O2 HS  . Depression   . Fatty tumor    waste and back  . Fibromyalgia   . GERD (gastroesophageal reflux disease)   . Glaucoma    bilateral eyes  . Gout   . Hereditary and idiopathic peripheral neuropathy 08/28/2015  . Hypertension   . Hypothyroidism   . Hypoxia   . Insomnia   . Memory disorder 08/28/2015  . On home oxygen therapy    at night with cpap  . Osteoarthritis   . Osteoporosis   . Peripheral neuropathy   . Rotator cuff tear, left   . Sleep apnea    uses cpap-add oxygen at night  . Spinal compression fracture ([facility]) seventh vertebre  . Transient alteration of awareness 08/28/2015  .  Type 2 diabetes mellitus ([place]) 03/08/2016  . Wears glasses     Past Surgical History:  Procedure Laterality Date  . APPENDECTOMY  age 52  . CARPAL TUNNEL RELEASE Right    early 2000s  . CATARACT EXTRACTION     bilateral  . CHOLECYSTECTOMY  age 80  . EYE SURGERY    . FOOT ARTHRODESIS Right 02/02/2013   Procedure: RIGHT HALLUX METATARSAL PHALANGEAL JOINT ARTHRODESIS ;  Surgeon: Wylene Simmer, MD;  Location: [facility];  Service: Orthopedics;  Laterality: Right;  . INGUINAL HERNIA REPAIR  age 44   rt side  . NASAL CONCHA BULLOSA RESECTION  age 51  . PROSTATE SURGERY    . SHOULDER ARTHROSCOPY W/ ROTATOR CUFF REPAIR Right    early 2000s  . tonsil    . VASECTOMY  age 39    Social History   Socioeconomic History  . Marital status: Married    Spouse name: Not on file  . Number of children: 2  . Years of education: Not on file  . Highest education level: Not on file  Occupational History  . Occupation: RETIRED    Employer: RETIRED  Social Needs  . Financial resource strain: Somewhat hard  . Food insecurity:    Worry: Never true    Inability: Never true  . Transportation needs:    Medical: No    Non-medical: No  Tobacco Use  . Smoking status: Former Smoker    Packs/day: 2.00    Years: 10.00    Pack years: 20.00    Types: Cigarettes    Last attempt to quit: 07/18/1975    Years since quitting: 42.8  . Smokeless tobacco: Never Used  Substance and Sexual Activity  . Alcohol use: Yes    Comment: 1-2 drinks/day  . Drug use: No  . Sexual activity: Not Currently  Lifestyle  . Physical activity:  Days per week: 0 days    Minutes per session: 0 min  . Stress: Not at all  Relationships  . Social connections:    Talks on phone: More than three times a week    Gets together: More than three times a week    Attends religious service: Never    Active member of club or organization: Yes    Attends meetings of clubs or organizations: More than 4 times per year     Relationship status: Married  Other Topics Concern  . Not on file  Social History Narrative  . Not on file    Family History  Problem Relation Age of Onset  . Coronary artery disease Brother   . Heart disease Father   . Lung cancer Father   . Kidney cancer Father   . Prostate cancer Father   . Arthritis Mother   . Lung cancer Mother     Review of Systems  Constitutional: Negative for appetite change, chills and fever.       Energy level back to baseline  Gastrointestinal: Negative for abdominal pain and nausea.  Genitourinary: Positive for frequency. Negative for difficulty urinating, dysuria and hematuria.       Urine normal color and odor  Psychiatric/Behavioral: Negative for confusion.       Objective:   Vitals:   05/18/18 1053  BP: (!) 148/82  Pulse: 71  Resp: 16  Temp: 98.1 F (36.7 C)  SpO2: 95%   BP Readings from Last 3 Encounters:  05/18/18 (!) 148/82  04/20/18 138/62  04/18/18 130/62   Wt Readings from Last 3 Encounters:  05/18/18 198 lb (89.8 kg)  04/20/18 198 lb (89.8 kg)  04/18/18 200 lb 1.9 oz (90.8 kg)   Body mass index is 31.01 kg/m.   Physical Exam    Constitutional: Appears well-developed and well-nourished. No distress.  Cardiovascular: Normal rate, regular rhythm and normal heart sounds.   No murmur heard. .  No edema Pulmonary/Chest: Effort normal and breath sounds normal. No respiratory distress. No has no wheezes. No rales.  Abdomen: Soft, nontender, nondistended Back:  No CVA tenderness b/l Skin: Skin is warm and dry. Not diaphoretic.  Psychiatric: Normal mood and affect. Behavior is normal.      Assessment & Plan:    See Problem List for Assessment and Plan of chronic medical problems.

## 2018-05-18 ENCOUNTER — Encounter: Payer: Self-pay | Admitting: Internal Medicine

## 2018-05-18 ENCOUNTER — Ambulatory Visit (INDEPENDENT_AMBULATORY_CARE_PROVIDER_SITE_OTHER): Payer: Medicare Other | Admitting: Internal Medicine

## 2018-05-18 VITALS — BP 148/82 | HR 71 | Temp 98.1°F | Resp 16 | Ht 67.0 in | Wt 198.0 lb

## 2018-05-18 DIAGNOSIS — N3000 Acute cystitis without hematuria: Secondary | ICD-10-CM | POA: Diagnosis not present

## 2018-05-18 NOTE — Patient Instructions (Addendum)
Complete the nitrofurantoin (antibiotic).  Continue increased fluids.     If you have any symptoms of a UTI please come in so we can check your urine.      Urinary Tract Infection, Adult A urinary tract infection (UTI) is an infection of any part of the urinary tract, which includes the kidneys, ureters, bladder, and urethra. These organs make, store, and get rid of urine in the body. UTI can be a bladder infection (cystitis) or kidney infection (pyelonephritis). What are the causes? This infection may be caused by fungi, viruses, or bacteria. Bacteria are the most common cause of UTIs. This condition can also be caused by repeated incomplete emptying of the bladder during urination. What increases the risk? This condition is more likely to develop if:  You ignore your need to urinate or hold urine for long periods of time.  You do not empty your bladder completely during urination.  You wipe back to front after urinating or having a bowel movement, if you are male.  You are uncircumcised, if you are male.  You are constipated.  You have a urinary catheter that stays in place (indwelling).  You have a weak defense (immune) system.  You have a medical condition that affects your bowels, kidneys, or bladder.  You have diabetes.  You take antibiotic medicines frequently or for long periods of time, and the antibiotics no longer work well against certain types of infections (antibiotic resistance).  You take medicines that irritate your urinary tract.  You are exposed to chemicals that irritate your urinary tract.  You are male.  What are the signs or symptoms? Symptoms of this condition include:  Fever.  Frequent urination or passing small amounts of urine frequently.  Needing to urinate urgently.  Pain or burning with urination.  Urine that smells bad or unusual.  Cloudy urine.  Pain in the lower abdomen or back.  Trouble urinating.  Blood in the  urine.  Vomiting or being less hungry than normal.  Diarrhea or abdominal pain.  Vaginal discharge, if you are male.  How is this diagnosed? This condition is diagnosed with a medical history and physical exam. You will also need to provide a urine sample to test your urine. Other tests may be done, including:  Blood tests.  Sexually transmitted disease (STD) testing.  If you have had more than one UTI, a cystoscopy or imaging studies may be done to determine the cause of the infections. How is this treated? Treatment for this condition often includes a combination of two or more of the following:  Antibiotic medicine.  Other medicines to treat less common causes of UTI.  Over-the-counter medicines to treat pain.  Drinking enough water to stay hydrated.  Follow these instructions at home:  Take over-the-counter and prescription medicines only as told by your health care provider.  If you were prescribed an antibiotic, take it as told by your health care provider. Do not stop taking the antibiotic even if you start to feel better.  Avoid alcohol, caffeine, tea, and carbonated beverages. They can irritate your bladder.  Drink enough fluid to keep your urine clear or pale yellow.  Keep all follow-up visits as told by your health care provider. This is important.  Make sure to: ? Empty your bladder often and completely. Do not hold urine for long periods of time. ? Empty your bladder before and after sex. ? Wipe from front to back after a bowel movement if you are male. Use  each tissue one time when you wipe. Contact a health care provider if:  You have back pain.  You have a fever.  You feel nauseous or vomit.  Your symptoms do not get better after 3 days.  Your symptoms go away and then return. Get help right away if:  You have severe back pain or lower abdominal pain.  You are vomiting and cannot keep down any medicines or water. This information is not  intended to replace advice given to you by your health care provider. Make sure you discuss any questions you have with your health care provider. Document Released: 03/11/2005 Document Revised: 11/13/2015 Document Reviewed: 04/22/2015 Elsevier Interactive Patient Education  [employer].

## 2018-05-18 NOTE — Assessment & Plan Note (Signed)
Was diagnosed while on vacation last week in [place] have to go to the emergency room Was started on Macrobid for 10 days he believes and has 2 days left, which she will complete Since he is feeling much better and having no symptoms we will not recheck his urine at this time Urinary tract infection may be related to BPH He is due for follow-up with urology and advised him to follow-up with them and discuss this recent urinary tract infection to see if he warrants any medication changes Advised him to continue increased fluids Advised him that if he has any subtle urinary symptoms he needs to have it evaluated to rule out a UTI

## 2018-05-26 ENCOUNTER — Encounter: Payer: Self-pay | Admitting: Internal Medicine

## 2018-05-28 MED ORDER — ZALEPLON 10 MG PO CAPS: 10.0000 mg | ORAL_CAPSULE | Freq: Every evening | ORAL | 0 refills | Status: DC | PRN

## 2018-05-29 ENCOUNTER — Other Ambulatory Visit: Payer: Self-pay | Admitting: Internal Medicine

## 2018-05-30 ENCOUNTER — Encounter: Payer: Self-pay | Admitting: Internal Medicine

## 2018-05-30 DIAGNOSIS — H401131 Primary open-angle glaucoma, bilateral, mild stage: Secondary | ICD-10-CM | POA: Diagnosis not present

## 2018-05-30 DIAGNOSIS — H04122 Dry eye syndrome of left lacrimal gland: Secondary | ICD-10-CM | POA: Diagnosis not present

## 2018-05-30 DIAGNOSIS — Z961 Presence of intraocular lens: Secondary | ICD-10-CM | POA: Diagnosis not present

## 2018-05-30 DIAGNOSIS — H532 Diplopia: Secondary | ICD-10-CM | POA: Diagnosis not present

## 2018-05-30 MED ORDER — ZALEPLON 10 MG PO CAPS: 10.0000 mg | ORAL_CAPSULE | Freq: Every evening | ORAL | 0 refills | Status: DC | PRN

## 2018-05-31 MED ORDER — ZALEPLON 10 MG PO CAPS: 10.0000 mg | ORAL_CAPSULE | Freq: Every evening | ORAL | 0 refills | Status: DC | PRN

## 2018-05-31 NOTE — Addendum Note (Signed)
Addended by: Binnie Rail on: 05/31/2018 12:03 PM   Modules accepted: Orders

## 2018-06-14 ENCOUNTER — Other Ambulatory Visit: Payer: Self-pay | Admitting: Internal Medicine

## 2018-06-16 NOTE — Progress Notes (Signed)
HPI: FU atrial fibrillation. Previously with a fib but converted spontaneously to sinus rhythm. Patient also with significant COPD. Abdominal ultrasound March 2015 showed no aneurysm. Nuclear study July 2016 showed ejection fraction 56% and normal perfusion. Echocardiogram repeated August 2018 and showed normal LV function, grade 2 diastolic dysfunction and mild left atrial enlargement. Monitor August 2018 showed sinus rhythm with PACs, PVCs and PAF. Monitor repeated July 2019.  Sinus rhythm with PACs, PVCs, rare couplet and 3 beats nonsustained ventricular tachycardia.  Had fall September 2019 resulting in subdural hematoma.  Patient was to follow-up with neurosurgery and then resume apixaban at their discretion if possible.  Since I last saw him,  he denies dyspnea, palpitations, syncope or pedal edema.  Occasional brief sharp pain in his chest for 1 to 2 seconds but no exertional chest pain.  He does state that he has had balance issues and comes close to falling.  Current Outpatient Medications  Medication Sig Dispense Refill  . acetaminophen (TYLENOL) 500 MG tablet Take 2 tablets (1,000 mg total) by mouth every 8 (eight) hours as needed. (Patient taking differently: Take 1,000 mg by mouth 2 (two) times daily. ) 360 tablet 1  . allopurinol (ZYLOPRIM) 300 MG tablet TAKE 1 TABLET BY MOUTH  EVERY EVENING 90 tablet 0  . atorvastatin (LIPITOR) 20 MG tablet TAKE 1 TABLET BY MOUTH  DAILY (Patient taking differently: Take 20 mg by mouth daily at 6 PM. ) 90 tablet 3  . Biotin 5000 MCG TABS Take 5,000 mcg by mouth daily.    . blood glucose meter kit and supplies KIT Dispense based on patient and insurance preference. Use up to four times daily as directed. (FOR E11.9). 1 each 0  . brimonidine (ALPHAGAN) 0.15 % ophthalmic solution Place 1 drop into the left eye 2 (two) times daily.     . calcium-vitamin D (OSCAL WITH D) 500-200 MG-UNIT tablet Take 1 tablet by mouth daily with breakfast.    .  colchicine (COLCRYS) 0.6 MG tablet Take 0.6 mg by mouth daily as needed (flareups).     . diltiazem (CARDIZEM CD) 300 MG 24 hr capsule TAKE 1 CAPSULE DAILY BY  MOUTH. (Patient taking differently: Take 300 mg by mouth daily. ) 90 capsule 1  . doxazosin (CARDURA) 8 MG tablet Take 1 tablet (8 mg total) by mouth daily. -- Office visit needed for further refills 90 tablet 0  . DULoxetine (CYMBALTA) 30 MG capsule TAKE 2 CAPSULES BY MOUTH IN THE MORNING AND 1 CAPSULE  BY MOUTH IN THE EVENING (Patient taking differently: Take 30-60 mg by mouth See admin instructions. TAKE 2 CAPSULES BY MOUTH IN THE MORNING AND 1 CAPSULE  BY MOUTH IN THE EVENING) 270 capsule 1  . hydrochlorothiazide (HYDRODIURIL) 25 MG tablet TAKE 1 TABLET BY MOUTH  DAILY (Patient taking differently: Take 25 mg by mouth daily. ) 90 tablet 3  . latanoprost (XALATAN) 0.005 % ophthalmic solution Place 1 drop into both eyes at bedtime. 7.5 mL 3  . levalbuterol (XOPENEX HFA) 45 MCG/ACT inhaler Inhale 1-2 puffs into the lungs every 4 (four) hours as needed for wheezing. 1 Inhaler 12  . levothyroxine (SYNTHROID, LEVOTHROID) 112 MCG tablet Take 1 tablet (112 mcg total) by mouth daily. 90 tablet 0  . magnesium oxide (MAG-OX) 400 MG tablet Take 400 mg by mouth daily.    . metFORMIN (GLUCOPHAGE) 500 MG tablet Take 1 tablet (500 mg total) by mouth 2 (two) times daily with a meal.  180 tablet 3  . mometasone (NASONEX) 50 MCG/ACT nasal spray Place 2 sprays into the nose daily. 17 g 3  . Multiple Vitamin (MULTIVITAMIN) tablet Take 1 tablet by mouth daily.    . nitroGLYCERIN (NITROSTAT) 0.4 MG SL tablet Place 1 tablet (0.4 mg total) under the tongue every 5 (five) minutes as needed for chest pain. 90 tablet 3  . omeprazole (PRILOSEC) 20 MG capsule TAKE 1 CAPSULE BY MOUTH  DAILY (Patient taking differently: Take 20 mg by mouth daily. ) 90 capsule 1  . oxyCODONE-acetaminophen (PERCOCET) 10-325 MG tablet Take 1 tablet by mouth every 8 (eight) hours as needed for  pain.    . potassium gluconate (EQL POTASSIUM GLUCONATE) 595 (99 K) MG TABS tablet Take 595 mg by mouth daily.    . tadalafil (CIALIS) 20 MG tablet Take 1 tablet (20 mg total) by mouth daily as needed. For erectile dysfunction 10 tablet 1  . Tiotropium Bromide-Olodaterol (STIOLTO RESPIMAT) 2.5-2.5 MCG/ACT AERS Inhale 2 puffs into the lungs daily. 2 Inhaler 0  . tiZANidine (ZANAFLEX) 4 MG capsule Take 1 capsule (4 mg total) by mouth at bedtime. 90 capsule 1  . vitamin C (ASCORBIC ACID) 500 MG tablet Take 1,000 mg by mouth daily.     Marland Kitchen VITAMIN D, CHOLECALCIFEROL, PO Take 50 mcg by mouth daily.    . zaleplon (SONATA) 10 MG capsule Take 1 capsule (10 mg total) by mouth at bedtime as needed for sleep. 90 capsule 0  . zolpidem (AMBIEN) 10 MG tablet Take 10 mg by mouth as needed for sleep.     No current facility-administered medications for this visit.      Past Medical History:  Diagnosis Date  . Atrial fibrillation (HCC)    a. paroxysmal, on Eliquis for anticoagulation  . BPH (benign prostatic hyperplasia)   . Bronchitis   . Cataract   . COPD (chronic obstructive pulmonary disease) (HCC)    2 liters O2 HS  . Depression   . Fatty tumor    waste and back  . Fibromyalgia   . GERD (gastroesophageal reflux disease)   . Glaucoma    bilateral eyes  . Gout   . Hereditary and idiopathic peripheral neuropathy 08/28/2015  . Hypertension   . Hypothyroidism   . Hypoxia   . Insomnia   . Memory disorder 08/28/2015  . On home oxygen therapy    at night with cpap  . Osteoarthritis   . Osteoporosis   . Peripheral neuropathy   . Rotator cuff tear, left   . Sleep apnea    uses cpap-add oxygen at night  . Spinal compression fracture (HCC) seventh vertebre  . Transient alteration of awareness 08/28/2015  . Type 2 diabetes mellitus ([place]) 03/08/2016  . Wears glasses     Past Surgical History:  Procedure Laterality Date  . APPENDECTOMY  age 46  . CARPAL TUNNEL RELEASE Right    early 2000s  .  CATARACT EXTRACTION     bilateral  . CHOLECYSTECTOMY  age 89  . EYE SURGERY    . FOOT ARTHRODESIS Right 02/02/2013   Procedure: RIGHT HALLUX METATARSAL PHALANGEAL JOINT ARTHRODESIS ;  Surgeon: Wylene Simmer, MD;  Location: [facility];  Service: Orthopedics;  Laterality: Right;  . INGUINAL HERNIA REPAIR  age 89   rt side  . NASAL CONCHA BULLOSA RESECTION  age 35  . PROSTATE SURGERY    . SHOULDER ARTHROSCOPY W/ ROTATOR CUFF REPAIR Right    early 2000s  .  tonsil    . VASECTOMY  age 63    Social History   Socioeconomic History  . Marital status: Married    Spouse name: Not on file  . Number of children: 2  . Years of education: Not on file  . Highest education level: Not on file  Occupational History  . Occupation: RETIRED    Employer: RETIRED  Social Needs  . Financial resource strain: Somewhat hard  . Food insecurity:    Worry: Never true    Inability: Never true  . Transportation needs:    Medical: No    Non-medical: No  Tobacco Use  . Smoking status: Former Smoker    Packs/day: 2.00    Years: 10.00    Pack years: 20.00    Types: Cigarettes    Last attempt to quit: 07/18/1975    Years since quitting: 42.9  . Smokeless tobacco: Never Used  Substance and Sexual Activity  . Alcohol use: Yes    Comment: 1-2 drinks/day  . Drug use: No  . Sexual activity: Not Currently  Lifestyle  . Physical activity:    Days per week: 0 days    Minutes per session: 0 min  . Stress: Not at all  Relationships  . Social connections:    Talks on phone: More than three times a week    Gets together: More than three times a week    Attends religious service: Never    Active member of club or organization: Yes    Attends meetings of clubs or organizations: More than 4 times per year    Relationship status: Married  . Intimate partner violence:    Fear of current or ex partner: No    Emotionally abused: No    Physically abused: No    Forced sexual activity: No  Other  Topics Concern  . Not on file  Social History Narrative  . Not on file    Family History  Problem Relation Age of Onset  . Coronary artery disease Brother   . Heart disease Father   . Lung cancer Father   . Kidney cancer Father   . Prostate cancer Father   . Arthritis Mother   . Lung cancer Mother     ROS: no fevers or chills, productive cough, hemoptysis, dysphasia, odynophagia, melena, hematochezia, dysuria, hematuria, rash, seizure activity, orthopnea, PND, pedal edema, claudication. Remaining systems are negative.  Physical Exam: Well-developed well-nourished in no acute distress.  Skin is warm and dry.  HEENT is normal.  Neck is supple.  Chest is clear to auscultation with normal expansion.  Cardiovascular exam is regular rate and rhythm.  Abdominal exam nontender or distended. No masses palpated. Extremities show no edema. neuro grossly intact  A/P  1 paroxysmal atrial fibrillation-patient is in sinus rhythm on examination today.  Continue Cardizem at present dose for rate control if atrial fibrillation recurs.  Long discussion today concerning anticoagulation.  He did fall previously and suffered a subdural hematoma.  He also has some balance issues.  He is concerned about recurrent bleeding which is understandable.  I also discussed the risk of not being on anticoagulation including CVA.  For now he would prefer to avoid anticoagulation.  Note follow-up head CT November 2019 showed resolution of previous subdural hematoma.  2 hypertension-blood pressure is controlled.  Continue present medications and follow.  3 obstructive sleep apnea-continue CPAP.  Kirk Ruths, MD

## 2018-06-27 ENCOUNTER — Encounter: Payer: Self-pay | Admitting: Cardiology

## 2018-06-27 ENCOUNTER — Ambulatory Visit (INDEPENDENT_AMBULATORY_CARE_PROVIDER_SITE_OTHER): Payer: Medicare Other | Admitting: Cardiology

## 2018-06-27 VITALS — BP 128/52 | HR 66 | Ht 66.0 in | Wt 203.0 lb

## 2018-06-27 DIAGNOSIS — I10 Essential (primary) hypertension: Secondary | ICD-10-CM

## 2018-06-27 DIAGNOSIS — I480 Paroxysmal atrial fibrillation: Secondary | ICD-10-CM

## 2018-06-27 NOTE — Patient Instructions (Signed)

## 2018-06-28 ENCOUNTER — Telehealth: Payer: Self-pay

## 2018-06-28 DIAGNOSIS — H9190 Unspecified hearing loss, unspecified ear: Secondary | ICD-10-CM

## 2018-06-28 NOTE — Telephone Encounter (Signed)
Referral faxed to [facility]

## 2018-06-28 NOTE — Telephone Encounter (Signed)
Copied from [facility] [id number]. Topic: General - Inquiry >> Jun 28, 2018  9:37 AM Virl Axe D wrote: Reason for CRM: Pt has a hearing evaluation at [facility] on tomorrow 06/29/18 at 1pm. It was just scheduled. They need a referral from Dr. Quay Burow. Fax# [phone]. Please contact pt if this cannot be sent before 1pm so that he can reschedule.

## 2018-06-28 NOTE — Telephone Encounter (Signed)
ordered

## 2018-06-29 DIAGNOSIS — H903 Sensorineural hearing loss, bilateral: Secondary | ICD-10-CM | POA: Diagnosis not present

## 2018-07-04 ENCOUNTER — Other Ambulatory Visit: Payer: Self-pay | Admitting: Internal Medicine

## 2018-07-12 DIAGNOSIS — H903 Sensorineural hearing loss, bilateral: Secondary | ICD-10-CM | POA: Insufficient documentation

## 2018-07-12 DIAGNOSIS — IMO0001 Reserved for inherently not codable concepts without codable children: Secondary | ICD-10-CM | POA: Insufficient documentation

## 2018-07-12 DIAGNOSIS — H905 Unspecified sensorineural hearing loss: Secondary | ICD-10-CM | POA: Insufficient documentation

## 2018-07-12 DIAGNOSIS — H9042 Sensorineural hearing loss, unilateral, left ear, with unrestricted hearing on the contralateral side: Secondary | ICD-10-CM | POA: Diagnosis not present

## 2018-07-12 HISTORY — DX: Unspecified sensorineural hearing loss: H90.5

## 2018-07-26 ENCOUNTER — Encounter: Payer: Self-pay | Admitting: Internal Medicine

## 2018-07-27 ENCOUNTER — Other Ambulatory Visit: Payer: Self-pay

## 2018-07-27 MED ORDER — TIZANIDINE HCL 4 MG PO CAPS: 4.0000 mg | ORAL_CAPSULE | Freq: Every day | ORAL | 1 refills | Status: DC

## 2018-07-27 NOTE — Telephone Encounter (Signed)
Ok to refill tizanidine, but received a 90 day supply of sonata one month ago so needs to take that for the next two months - he can only have and take one of these medications at a time.

## 2018-07-27 NOTE — Telephone Encounter (Signed)
Received a [employer] requesting a refill on ambien. It looks like he has been taking sonata too.   Last refill of ambien was 04/18/2018 Last refill of sonata was 06/27/18   Last OV was 05/18/18 Next OV is not listed   Please advise.

## 2018-08-04 ENCOUNTER — Other Ambulatory Visit: Payer: Self-pay | Admitting: Internal Medicine

## 2018-08-10 ENCOUNTER — Other Ambulatory Visit: Payer: Self-pay | Admitting: Cardiology

## 2018-08-10 ENCOUNTER — Other Ambulatory Visit: Payer: Self-pay | Admitting: Internal Medicine

## 2018-08-10 ENCOUNTER — Other Ambulatory Visit: Payer: Self-pay | Admitting: Emergency Medicine

## 2018-08-10 DIAGNOSIS — I480 Paroxysmal atrial fibrillation: Secondary | ICD-10-CM

## 2018-08-11 ENCOUNTER — Other Ambulatory Visit: Payer: Self-pay | Admitting: Internal Medicine

## 2018-08-11 ENCOUNTER — Encounter: Payer: Medicare Other | Admitting: Psychology

## 2018-08-11 ENCOUNTER — Other Ambulatory Visit: Payer: Self-pay

## 2018-08-11 MED ORDER — METFORMIN HCL 500 MG PO TABS: 500.0000 mg | ORAL_TABLET | Freq: Two times a day (BID) | ORAL | 1 refills | Status: DC

## 2018-08-19 ENCOUNTER — Ambulatory Visit (INDEPENDENT_AMBULATORY_CARE_PROVIDER_SITE_OTHER): Payer: Medicare Other | Admitting: Emergency Medicine

## 2018-08-19 ENCOUNTER — Encounter: Payer: Self-pay | Admitting: Emergency Medicine

## 2018-08-19 DIAGNOSIS — J438 Other emphysema: Secondary | ICD-10-CM | POA: Diagnosis not present

## 2018-08-19 DIAGNOSIS — G4733 Obstructive sleep apnea (adult) (pediatric): Secondary | ICD-10-CM | POA: Diagnosis not present

## 2018-08-19 NOTE — Assessment & Plan Note (Signed)
Currently off of his Stiolto because he did not have it while traveling.  He is not sure that he misses it and he has been dealing with dry mouth.  Certainly this could be a contributor to dry mouth although he has other medicines that could be contributing also.  I think is reasonable to do a trial off of the Stiolto to see if his breathing changes.  He will keep Xopenex available to use if needed.  If he does have dyspnea then we will either restart the Stiolto or find an alternative.

## 2018-08-19 NOTE — Assessment & Plan Note (Signed)
We will continue his CPAP as ordered.  He has good compliance.  He has difficulty initiating sleep and therefore uses a sleep aid, also drinks bourbon before he goes to bed.  It may be that both of these are contributing to his sleepiness in the morning after he gets up and has his breakfast.  He reliably takes a nap at that time.  May have to modify that pattern although he has had difficulty initiating sleep ever since I have known him.

## 2018-08-19 NOTE — Progress Notes (Signed)
Subjective:    Patient ID: Jonathan [name], male    DOB: August 03, 1932, 85 y.o.   MRN: [id number] HPI 85 yo man, former smoker, hx of A Fib, COPD on nocturnal O2, GERD, also OSA not on CPAP for over 4 yrs. Jonathan [name] has had PFT done in [place] Fall 2012 that supported COPD, walking oximetry that showed ? Hypoxemia, ONO with desats and started on . Began to have AE +/- PNA in 1/13 that never really got better. Jonathan [name] was admitted to [facility] 2/4-07/21/11 for an AE-COPD associated with A fib + RVR.  Was rx steroids, abx, BD's. Jonathan [name] is better but is limited to some degree, not back to prior baseline. Jonathan [name] is low on energy. Jonathan [name] is having trouble sleeping, difficulty falling asleep and staying asleep. Jonathan [name] snores, no witnessed apnea. No real wheezing or coughing at this time.    ROV 01/04/18 --85 year old man followed for COPD and obstructive sleep apnea on CPAP.  Jonathan [name] has been seen twice in our office for exacerbations, bronchitis since I saw him last in January 2018.  Most recently Jonathan [name] was treated in late May 2019 with azithromycin for possible bronchitis - Jonathan [name] doesn't really remember what was going on with his sx.  Jonathan [name] returns today reporting. Maintained on stiolto 2 puffs qd. Jonathan [name] says that insurance coverage is worse this year. Jonathan [name] doesn't know where his xopenex is. Minimal cough / wheeze. Jonathan [name] is compliant with CPAP, 100% for > 4 hours, confirmed on his download info today. Recently had his O2 concentrator replaced, bleeds in 2L/min. Good clinical response to the CPAP - better sleep quality. Jonathan [name] does note some insomnia and difficulty initiating sleep. Jonathan [name] uses sonata for sleep.   ROV 07/21/2018 --[name] is 89, follows up today for his COPD and obstructive sleep apnea for which Jonathan [name] uses CPAP.  Also with a history of atrial fibrillation on anticoagulation.  We have been maintaining him on Stiolto. Jonathan [name] reports that Jonathan [name] had to stop Stiolto for 2 weeks recently while travelling - isn't sure that Jonathan [name] missed it.  Jonathan [name] uses Xopenex approximately.  Jonathan [name] is  on an AutoSet CPAP 5-20 cm water, compliance data today shows that Jonathan [name] uses it reliably, 90% of the time greater than 4 hours per night.  Average pressure appears to be 12 with minimal leak and good control of his obstructive events.  Jonathan [name] has had difficulty initiating sleep, insomnia in the past and is used Quarry manager for this, as well as a Bourbon before bedtime. Jonathan [name] is taking a nap every day after breakfast, even after sleeping 8 hours. Jonathan [name] is having a lot of dry mouth.  Since last time it appears that Jonathan [name] was admitted for traumatic SDH following a fall. Jonathan [name] decided to stay off anticoagulation.      Objective:   Physical Exam Vitals:   08/19/18 1109  BP: 124/78  Pulse: (!) 56  SpO2: 96%  Weight: 212 lb (96.2 kg)  Height: 5\' 7"  (1.702 m)    Gen: Pleasant, well-nourished, in no distress,  normal affect  ENT: No lesions,  mouth clear,  oropharynx clear, no postnasal drip  Neck: No JVD, no stridor  Lungs: No use of accessory muscles, clear without rales or rhonchi  Cardiovascular: RRR, heart sounds normal, no murmur or gallops, no peripheral edema  Musculoskeletal: No deformities, no cyanosis or clubbing  Neuro: alert, non focal  Skin: Warm, no lesions or rashes     Assessment & Plan:  COPD (chronic obstructive pulmonary disease) (HCC) Currently  off of his Stiolto because Jonathan [name] did not have it while traveling.  Jonathan [name] is not sure that Jonathan [name] misses it and Jonathan [name] has been dealing with dry mouth.  Certainly this could be a contributor to dry mouth although Jonathan [name] has other medicines that could be contributing also.  I think is reasonable to do a trial off of the Stiolto to see if his breathing changes.  Jonathan [name] will keep Xopenex available to use if needed.  If Jonathan [name] does have dyspnea then we will either restart the Stiolto or find an alternative.  Sleep apnea, obstructive We will continue his CPAP as ordered.  Jonathan [name] has good compliance.  Jonathan [name] has difficulty initiating sleep and therefore uses a sleep aid, also drinks bourbon  before Jonathan [name] goes to bed.  It may be that both of these are contributing to his sleepiness in the morning after Jonathan [name] gets up and has his breakfast.  Jonathan [name] reliably takes a nap at that time.  May have to modify that pattern although Jonathan [name] has had difficulty initiating sleep ever since I have known him.  [name], MD, PhD 08/19/2018, 11:30 AM [facility] [phone] or if no answer [phone]

## 2018-08-19 NOTE — Patient Instructions (Signed)
We will not restart Stiolto for now. Keep your albuterol available to use 2 puffs if needed for shortness of breath, chest tightness, wheezing. Keep track of how your breathing does off the scheduled inhaler medication.  If you miss it then we will either restart the Stiolto or look for an alternative that would be less likely to contribute to your dry mouth. Continue CPAP every night as you have been using it. Some of your medications may be contributing to daytime sleepiness. Follow with Jonathan [name] in 3 months or sooner if you have any problems.

## 2018-08-22 ENCOUNTER — Other Ambulatory Visit: Payer: Self-pay | Admitting: Internal Medicine

## 2018-08-23 DIAGNOSIS — R3915 Urgency of urination: Secondary | ICD-10-CM | POA: Diagnosis not present

## 2018-08-23 DIAGNOSIS — N5201 Erectile dysfunction due to arterial insufficiency: Secondary | ICD-10-CM | POA: Diagnosis not present

## 2018-08-23 DIAGNOSIS — N401 Enlarged prostate with lower urinary tract symptoms: Secondary | ICD-10-CM | POA: Diagnosis not present

## 2018-08-29 DIAGNOSIS — M25562 Pain in left knee: Secondary | ICD-10-CM | POA: Diagnosis not present

## 2018-08-29 DIAGNOSIS — M1712 Unilateral primary osteoarthritis, left knee: Secondary | ICD-10-CM | POA: Diagnosis not present

## 2018-09-05 DIAGNOSIS — M1712 Unilateral primary osteoarthritis, left knee: Secondary | ICD-10-CM | POA: Diagnosis not present

## 2018-09-07 ENCOUNTER — Other Ambulatory Visit: Payer: Self-pay | Admitting: Internal Medicine

## 2018-09-12 DIAGNOSIS — M1712 Unilateral primary osteoarthritis, left knee: Secondary | ICD-10-CM | POA: Diagnosis not present

## 2018-09-12 DIAGNOSIS — M25562 Pain in left knee: Secondary | ICD-10-CM | POA: Diagnosis not present

## 2018-09-13 ENCOUNTER — Other Ambulatory Visit: Payer: Self-pay | Admitting: Internal Medicine

## 2018-09-14 NOTE — Telephone Encounter (Signed)
Last refill was 06/27/18 for 90 day supply Last OV 05/19/19 Next OV 04/26/19

## 2018-09-15 ENCOUNTER — Encounter: Payer: Self-pay | Admitting: Internal Medicine

## 2018-09-15 NOTE — Progress Notes (Deleted)
Virtual Visit via Video Note  I connected with Jonathan [name] on 09/15/18 at  8:45 AM EDT by a video enabled telemedicine application and verified that I am speaking with the correct person using two identifiers.   I discussed the limitations of evaluation and management by telemedicine and the availability of in person appointments. The patient expressed understanding and agreed to proceed.  The patient is currently at home and I am in the office.    No referring provider.    History of Present Illness: Jonathan [name] is here for follow up of his chronic medical conditions.    Insomnia: Jonathan [name] is a long history of insomnia.  Jonathan [name] uses Ambien or Sonata at night to help him sleep.  After using 1 for certain time.  It becomes ineffective and Jonathan [name] will use the other.  Recently Jonathan [name] has been taking Sonata at night and it has become ineffective.  Jonathan [name] wakes up during the night several times and eventually will have to get up and read.  This can occur 3-4 times during the night.  Even though Jonathan [name] has not been sleeping and is exhausted his sleep has not improved.  Jonathan [name] would ideally like to get a prescription for Ambien.  It does look like a prescription for Ambien was sent 09/13/2018 490-day supply.   Observations/Objective:   Assessment and Plan:  See Problem List for Assessment and Plan of chronic medical problems.   Follow Up Instructions:    I discussed the assessment and treatment plan with the patient. The patient was provided an opportunity to ask questions and all were answered. The patient agreed with the plan and demonstrated an understanding of the instructions.   The patient was advised to call back or seek an in-person evaluation if the symptoms worsen or if the condition fails to improve as anticipated.    Binnie Rail, MD

## 2018-09-15 NOTE — Telephone Encounter (Signed)
Virtual Visit has been made.

## 2018-09-16 ENCOUNTER — Other Ambulatory Visit: Payer: Self-pay

## 2018-09-16 ENCOUNTER — Ambulatory Visit (INDEPENDENT_AMBULATORY_CARE_PROVIDER_SITE_OTHER): Payer: Medicare Other | Admitting: Internal Medicine

## 2018-09-16 ENCOUNTER — Encounter: Payer: Self-pay | Admitting: Internal Medicine

## 2018-09-16 VITALS — BP 146/78 | HR 88 | Temp 98.3°F | Resp 16 | Ht 67.0 in | Wt 205.0 lb

## 2018-09-16 DIAGNOSIS — G4700 Insomnia, unspecified: Secondary | ICD-10-CM | POA: Diagnosis not present

## 2018-09-16 MED ORDER — ZOLPIDEM TARTRATE 10 MG PO TABS: ORAL_TABLET | ORAL | 0 refills | Status: DC

## 2018-09-16 NOTE — Assessment & Plan Note (Signed)
He has longstanding insomnia-more than 20 years For a while he has been alternating Sonata and Ambien because after using one it will become ineffective He is more concerned about taking Ambien on a regular basis because of possible side effects-the 2 times in the past he had side effects he took the medication too early before going to bed.  Stressed that he needs to take this right before going to bed only He is aware that he cannot take both medications at the same time He would like to alternate them to some degree, but I would like to only prescribe one at a time for safety reasons I will prescribe Ambien today, which is slightly early.  He understands he can take this on a nightly basis as long as he takes it correctly and this may work long-term.  If it does not we can alternate between Sonata and Ambien  He will call me if the sleep does not improve with switching back to the Ambien

## 2018-09-16 NOTE — Progress Notes (Signed)
Subjective:    Patient ID: Jonathan [name], male    DOB: 1932-11-26, 85 y.o.   MRN: [id number]  HPI The patient is here for an acute visit.   He is here for follow up of his chronic medical conditions.    Insomnia: He is a long history of insomnia.  He uses Ambien or Sonata at night to help him sleep.  After using 1 for certain time it becomes ineffective and he will use the other.  Recently he has been taking Sonata at night and it has become ineffective.  He wakes up during the night several times and eventually will have to get up and read.  This can occur 3-4 times during the night.  Even though he has not been sleeping and is exhausted his sleep has not improved.  He would ideally like to get a prescription for Ambien.  It does look like a prescription for Ambien was sent to his pharmacy.  He was not aware of this.  His Sonata was last filled 06/27/2018, so he is almost due for a prescription.  He does practice good sleep hygiene.  He uses a CPAP nightly.  He overall feels well besides not being able to sleep.  He has no other concerns.  He has been staying at home as much as possible, except for the necessary errands.  He denies any depression or anxiety and feels he is dealing with the change in lifestyle okay.  He has not had any fevers, chills, cough, wheeze or shortness of breath.  Medications and allergies reviewed with patient and updated if appropriate.  Patient Active Problem List   Diagnosis Date Noted  . Acute cystitis without hematuria 05/18/2018  . Open wnd of scalp 04/18/2018  . Scalp abrasion, non-infected 03/16/2018  . Intracranial hemorrhage ([place]) 03/06/2018  . Fall   . Frequent bowel movements 02/15/2018  . Hair loss 02/15/2018  . Skin abnormalities 01/26/2018  . Chronic anticoagulation 12/21/2017  . Polypharmacy 12/21/2017  . Mid back pain on left side 08/10/2017  . Panic attack 08/09/2017  . COPD with acute exacerbation ([place]) 07/23/2017  . Spinal  compression fracture ([place])   . Sleep apnea   . Rotator cuff tear, left   . Osteoarthritis   . On home oxygen therapy   . Hypoxia   . Fibromyalgia   . Fatty tumor   . Cataract   . BPH (benign prostatic hyperplasia)   . PAF (paroxysmal atrial fibrillation) ([place])   . Insomnia 03/10/2017  . Chronic pain, legs and back 03/10/2017  . Hyperlipidemia 03/10/2017  . Obesity (BMI 30.0-34.9) 12/08/2016  . Depression 09/07/2016  . Osteoporosis 09/07/2016  . Hypothyroidism 03/08/2016  . GERD (gastroesophageal reflux disease) 03/08/2016  . Gout 03/08/2016  . Glaucoma 03/08/2016  . Type 2 diabetes mellitus ([place]) 03/08/2016  . Hereditary and idiopathic peripheral neuropathy 08/28/2015  . Memory disorder 08/28/2015  . Fatigue 01/08/2015  . Bruit 08/15/2013  . Essential hypertension 12/04/2011  . Allergic rhinitis, seasonal 12/04/2011  . Sleep apnea, obstructive 08/25/2011  . COPD (chronic obstructive pulmonary disease) ([place]) 07/18/2011    Current Outpatient Medications on File Prior to Visit  Medication Sig Dispense Refill  . acetaminophen (TYLENOL) 500 MG tablet Take 2 tablets (1,000 mg total) by mouth every 8 (eight) hours as needed. (Patient taking differently: Take 1,000 mg by mouth 2 (two) times daily. ) 360 tablet 1  . allopurinol (ZYLOPRIM) 300 MG tablet TAKE 1 TABLET BY MOUTH  EVERY EVENING 90  tablet 0  . atorvastatin (LIPITOR) 20 MG tablet Take 1 tablet (20 mg total) by mouth daily at 6 PM. 90 tablet 1  . Biotin 5000 MCG TABS Take 5,000 mcg by mouth daily.    . blood glucose meter kit and supplies KIT Dispense based on patient and insurance preference. Use up to four times daily as directed. (FOR E11.9). 1 each 0  . brimonidine (ALPHAGAN) 0.15 % ophthalmic solution Place 1 drop into the left eye 2 (two) times daily.     . calcium-vitamin D (OSCAL WITH D) 500-200 MG-UNIT tablet Take 1 tablet by mouth daily with breakfast.    . colchicine (COLCRYS) 0.6 MG tablet Take 0.6 mg by mouth  daily as needed (flareups).     . diltiazem (CARDIZEM CD) 300 MG 24 hr capsule TAKE 1 CAPSULE BY MOUTH  DAILY 90 capsule 1  . doxazosin (CARDURA) 8 MG tablet TAKE 1 TABLET BY MOUTH  DAILY. -- OFFICE VISIT  NEEDED FOR FURTHER REFILLS 90 tablet 0  . DULoxetine (CYMBALTA) 30 MG capsule TAKE 2 CAPSULES BY MOUTH IN THE MORNING AND 1 CAPSULE  BY MOUTH IN THE EVENING 270 capsule 1  . hydrochlorothiazide (HYDRODIURIL) 25 MG tablet TAKE 1 TABLET BY MOUTH  DAILY 90 tablet 1  . latanoprost (XALATAN) 0.005 % ophthalmic solution Place 1 drop into both eyes at bedtime. 7.5 mL 3  . levalbuterol (XOPENEX HFA) 45 MCG/ACT inhaler Inhale 1-2 puffs into the lungs every 4 (four) hours as needed for wheezing. 1 Inhaler 12  . levothyroxine (SYNTHROID, LEVOTHROID) 112 MCG tablet TAKE 1 TABLET BY MOUTH EVERY DAY 90 tablet 1  . magnesium oxide (MAG-OX) 400 MG tablet Take 400 mg by mouth daily.    . metFORMIN (GLUCOPHAGE) 500 MG tablet Take 1 tablet (500 mg total) by mouth 2 (two) times daily with a meal. 180 tablet 1  . mometasone (NASONEX) 50 MCG/ACT nasal spray Place 2 sprays into the nose daily. 17 g 3  . Multiple Vitamin (MULTIVITAMIN) tablet Take 1 tablet by mouth daily.    . nitroGLYCERIN (NITROSTAT) 0.4 MG SL tablet Place 1 tablet (0.4 mg total) under the tongue every 5 (five) minutes as needed for chest pain. 90 tablet 3  . omeprazole (PRILOSEC) 20 MG capsule TAKE 1 CAPSULE BY MOUTH  DAILY 90 capsule 1  . oxyCODONE-acetaminophen (PERCOCET) 10-325 MG tablet Take 1 tablet by mouth every 8 (eight) hours as needed for pain.    . potassium gluconate (EQL POTASSIUM GLUCONATE) 595 (99 K) MG TABS tablet Take 595 mg by mouth daily.    . tadalafil (CIALIS) 20 MG tablet Take 1 tablet (20 mg total) by mouth daily as needed. For erectile dysfunction 10 tablet 1  . tiZANidine (ZANAFLEX) 4 MG capsule TAKE 1 CAPSULE BY MOUTH AT  BEDTIME 90 capsule 1  . tolterodine (DETROL LA) 4 MG 24 hr capsule Take 4 mg by mouth daily.    .  vitamin C (ASCORBIC ACID) 500 MG tablet Take 1,000 mg by mouth daily.     Marland Kitchen VITAMIN D, CHOLECALCIFEROL, PO Take 50 mcg by mouth daily.    . zaleplon (SONATA) 10 MG capsule Take 1 capsule (10 mg total) by mouth at bedtime as needed for sleep. 90 capsule 0  . zolpidem (AMBIEN) 10 MG tablet TAKE 1 TABLET (10 MG TOTAL) BY MOUTH AT BEDTIME AS NEEDED FOR SLEEP. 30 tablet 0   No current facility-administered medications on file prior to visit.     Past Medical  History:  Diagnosis Date  . Atrial fibrillation ([facility])    a. paroxysmal, on Eliquis for anticoagulation  . BPH (benign prostatic hyperplasia)   . Bronchitis   . Cataract   . COPD (chronic obstructive pulmonary disease) ([facility])    2 liters O2 HS  . Depression   . Fatty tumor    waste and back  . Fibromyalgia   . GERD (gastroesophageal reflux disease)   . Glaucoma    bilateral eyes  . Gout   . Hereditary and idiopathic peripheral neuropathy 08/28/2015  . Hypertension   . Hypothyroidism   . Hypoxia   . Insomnia   . Memory disorder 08/28/2015  . On home oxygen therapy    at night with cpap  . Osteoarthritis   . Osteoporosis   . Peripheral neuropathy   . Rotator cuff tear, left   . Sleep apnea    uses cpap-add oxygen at night  . Spinal compression fracture ([facility]) seventh vertebre  . Transient alteration of awareness 08/28/2015  . Type 2 diabetes mellitus ([place]) 03/08/2016  . Wears glasses     Past Surgical History:  Procedure Laterality Date  . APPENDECTOMY  age 26  . CARPAL TUNNEL RELEASE Right    early 2000s  . CATARACT EXTRACTION     bilateral  . CHOLECYSTECTOMY  age 59  . EYE SURGERY    . FOOT ARTHRODESIS Right 02/02/2013   Procedure: RIGHT HALLUX METATARSAL PHALANGEAL JOINT ARTHRODESIS ;  Surgeon: Wylene Simmer, MD;  Location: [facility];  Service: Orthopedics;  Laterality: Right;  . INGUINAL HERNIA REPAIR  age 68   rt side  . NASAL CONCHA BULLOSA RESECTION  age 74  . PROSTATE SURGERY    . SHOULDER  ARTHROSCOPY W/ ROTATOR CUFF REPAIR Right    early 2000s  . tonsil    . VASECTOMY  age 47    Social History   Socioeconomic History  . Marital status: Married    Spouse name: Not on file  . Number of children: 2  . Years of education: Not on file  . Highest education level: Not on file  Occupational History  . Occupation: RETIRED    Employer: RETIRED  Social Needs  . Financial resource strain: Somewhat hard  . Food insecurity:    Worry: Never true    Inability: Never true  . Transportation needs:    Medical: No    Non-medical: No  Tobacco Use  . Smoking status: Former Smoker    Packs/day: 2.00    Years: 10.00    Pack years: 20.00    Types: Cigarettes    Last attempt to quit: 07/18/1975    Years since quitting: 43.1  . Smokeless tobacco: Never Used  Substance and Sexual Activity  . Alcohol use: Yes    Comment: 1-2 drinks/day  . Drug use: No  . Sexual activity: Not Currently  Lifestyle  . Physical activity:    Days per week: 0 days    Minutes per session: 0 min  . Stress: Not at all  Relationships  . Social connections:    Talks on phone: More than three times a week    Gets together: More than three times a week    Attends religious service: Never    Active member of club or organization: Yes    Attends meetings of clubs or organizations: More than 4 times per year    Relationship status: Married  Other Topics Concern  . Not on file  Social History Narrative  . Not on file    Family History  Problem Relation Age of Onset  . Coronary artery disease Brother   . Heart disease Father   . Lung cancer Father   . Kidney cancer Father   . Prostate cancer Father   . Arthritis Mother   . Lung cancer Mother     Review of Systems     Objective:   Vitals:   09/16/18 0853  BP: (!) 146/78  Pulse: 88  Resp: 16  Temp: 98.3 F (36.8 C)  SpO2: 96%   BP Readings from Last 3 Encounters:  09/16/18 (!) 146/78  08/19/18 124/78  06/27/18 (!) 128/52   Wt  Readings from Last 3 Encounters:  09/16/18 205 lb (93 kg)  08/19/18 212 lb (96.2 kg)  06/27/18 203 lb (92.1 kg)   Body mass index is 32.11 kg/m.   Physical Exam Constitutional:      Appearance: Normal appearance. He is obese. He is not ill-appearing.  Skin:    General: Skin is warm and dry.  Neurological:     Mental Status: He is alert.  Psychiatric:        Mood and Affect: Mood normal.        Behavior: Behavior normal.        Thought Content: Thought content normal.        Judgment: Judgment normal.            Assessment & Plan:    See Problem List for Assessment and Plan of chronic medical problems.

## 2018-09-16 NOTE — Patient Instructions (Signed)
Jonathan [name] was sent to [facility].    Please call if there is no improvement in your symptoms.

## 2018-10-19 DIAGNOSIS — M79642 Pain in left hand: Secondary | ICD-10-CM | POA: Diagnosis not present

## 2018-10-19 DIAGNOSIS — M19041 Primary osteoarthritis, right hand: Secondary | ICD-10-CM | POA: Diagnosis not present

## 2018-10-19 DIAGNOSIS — M19042 Primary osteoarthritis, left hand: Secondary | ICD-10-CM | POA: Diagnosis not present

## 2018-10-19 DIAGNOSIS — M79641 Pain in right hand: Secondary | ICD-10-CM | POA: Diagnosis not present

## 2018-10-24 ENCOUNTER — Encounter: Payer: Self-pay | Admitting: Internal Medicine

## 2018-10-25 ENCOUNTER — Ambulatory Visit (INDEPENDENT_AMBULATORY_CARE_PROVIDER_SITE_OTHER): Payer: Medicare Other | Admitting: Internal Medicine

## 2018-10-25 ENCOUNTER — Encounter: Payer: Self-pay | Admitting: Internal Medicine

## 2018-10-25 DIAGNOSIS — J3489 Other specified disorders of nose and nasal sinuses: Secondary | ICD-10-CM

## 2018-10-25 NOTE — Assessment & Plan Note (Signed)
Soreness in left nostril associated with some mild bleeding No evidence of a sinus infection No need for oral antibiotics Likely local sore or dryness Continue local antibiotic ointment - use Q-tip  - gently apply Start saline nasal spray to keep nostril moist Call if symptoms do not improve or worsen or with any questions.

## 2018-10-25 NOTE — Progress Notes (Signed)
Virtual Visit via Video Note  I connected with Jonathan [name] on 10/25/18 at  9:00 AM EDT by a video enabled telemedicine application and verified that I am speaking with the correct person using two identifiers.   I discussed the limitations of evaluation and management by telemedicine and the availability of in person appointments. The patient expressed understanding and agreed to proceed.  The patient is currently at home and I am in the office.    No referring provider.    History of Present Illness: This is an acute visit for sinus issues.  His symptoms started yesterday.  He has a soreness in his left nostril.  It is not pain - it is more of a soreness or discomfort.  It feels different than the right side.  Yesterday, he put some antibiotic ointment in the nostril and he had bloody residue on the Q-tip.  He is not sure if he has an infection inside the nose or sinus issues.    He is taking his allergy medication, including using nasonex.    Review of Systems  Constitutional: Negative for chills and fever.  HENT: Positive for congestion. Negative for ear pain, sinus pain and sore throat.        No PND  Respiratory: Negative for cough, shortness of breath and wheezing.   Neurological: Negative for dizziness and headaches.      Social History   Socioeconomic History  . Marital status: Married    Spouse name: Not on file  . Number of children: 2  . Years of education: Not on file  . Highest education level: Not on file  Occupational History  . Occupation: RETIRED    Employer: RETIRED  Social Needs  . Financial resource strain: Somewhat hard  . Food insecurity:    Worry: Never true    Inability: Never true  . Transportation needs:    Medical: No    Non-medical: No  Tobacco Use  . Smoking status: Former Smoker    Packs/day: 2.00    Years: 10.00    Pack years: 20.00    Types: Cigarettes    Last attempt to quit: 07/18/1975    Years since quitting: 43.3  .  Smokeless tobacco: Never Used  Substance and Sexual Activity  . Alcohol use: Yes    Comment: 1-2 drinks/day  . Drug use: No  . Sexual activity: Not Currently  Lifestyle  . Physical activity:    Days per week: 0 days    Minutes per session: 0 min  . Stress: Not at all  Relationships  . Social connections:    Talks on phone: More than three times a week    Gets together: More than three times a week    Attends religious service: Never    Active member of club or organization: Yes    Attends meetings of clubs or organizations: More than 4 times per year    Relationship status: Married  Other Topics Concern  . Not on file  Social History Narrative  . Not on file     Observations/Objective: Appears well in NAD No obvious swelling or redness outside of nose  Temp 96.9  Assessment and Plan:  See Problem List for Assessment and Plan of chronic medical problems.   Follow Up Instructions:    I discussed the assessment and treatment plan with the patient. The patient was provided an opportunity to ask questions and all were answered. The patient agreed with the plan and demonstrated an  understanding of the instructions.   The patient was advised to call back or seek an in-person evaluation if the symptoms worsen or if the condition fails to improve as anticipated.    Binnie Rail, MD

## 2018-10-26 ENCOUNTER — Other Ambulatory Visit: Payer: Self-pay | Admitting: Internal Medicine

## 2018-10-26 NOTE — Progress Notes (Signed)
Virtual Visit via Video Note  I connected with Jonathan [name] on 10/27/18 at  9:00 AM EDT by a video enabled telemedicine application and verified that I am speaking with the correct person using two identifiers.   I discussed the limitations of evaluation and management by telemedicine and the availability of in person appointments. The patient expressed understanding and agreed to proceed.  The patient is currently at home and I am in the office.    No referring provider.    History of Present Illness: He is here for follow up of his chronic medical conditions.  He is not exercising regularly.    ? Recent TIA:  A couple of weeks ago he was sitting in his chair and got up and was staggering all over.  He was not able to process things as well.  It has gradually improved since then.  He feels 90-95% now.  He was not aware of any afib at that time, but may not necessarily know if he is in it.  He is not longer on eliquis due to a previous fall and a subdural hematoma.   Diabetes: He is taking his medication daily as prescribed. He is compliant with a diabetic diet.  He does not monitor his sugars. He checks his feet daily and denies foot lesions. He is up-to-date with an ophthalmology examination.   Afib, Hypertension: He is taking his medication daily. He is compliant with a low sodium diet.  He denies chest pain, palpitations, shortness of breath and regular headaches.  He does not monitor his blood pressure at home.    Insomnia: He takes either Ambien or Sonata.  Both of them work, but after taking either 1 for short period of time they will stop working.  He finds that switching between the 2 is what works the best.  Hypothyroidism:  He is taking his medication daily.  He denies any recent changes in energy or weight that are unexplained.   Hyperlipidemia: He is taking his medication daily. He is compliant with a low fat/cholesterol diet. He denies myalgias.   Gout: He takes  allopurinol daily.  He has not had any gout symptoms.  Chronic back pain: His back is about the same.    Knee arthritis: He is following with orthopedics and getting injections.  He will need a knee replacement eventually, probably both knees.      Review of Systems  Constitutional: Negative for chills and fever.  Respiratory: Negative for cough, shortness of breath and wheezing.   Cardiovascular: Positive for leg swelling (mild). Negative for chest pain and palpitations.  Musculoskeletal:       Slight balance problem.  No recent falls  Neurological: Negative for dizziness and headaches.     Social History   Socioeconomic History  . Marital status: Married    Spouse name: Not on file  . Number of children: 2  . Years of education: Not on file  . Highest education level: Not on file  Occupational History  . Occupation: RETIRED    Employer: RETIRED  Social Needs  . Financial resource strain: Somewhat hard  . Food insecurity:    Worry: Never true    Inability: Never true  . Transportation needs:    Medical: No    Non-medical: No  Tobacco Use  . Smoking status: Former Smoker    Packs/day: 2.00    Years: 10.00    Pack years: 20.00    Types: Cigarettes    Last attempt  to quit: 07/18/1975    Years since quitting: 43.3  . Smokeless tobacco: Never Used  Substance and Sexual Activity  . Alcohol use: Yes    Comment: 1-2 drinks/day  . Drug use: No  . Sexual activity: Not Currently  Lifestyle  . Physical activity:    Days per week: 0 days    Minutes per session: 0 min  . Stress: Not at all  Relationships  . Social connections:    Talks on phone: More than three times a week    Gets together: More than three times a week    Attends religious service: Never    Active member of club or organization: Yes    Attends meetings of clubs or organizations: More than 4 times per year    Relationship status: Married  Other Topics Concern  . Not on file  Social History Narrative   . Not on file     Observations/Objective: Appears well in NAD  Lab Results  Component Value Date   HGBA1C 7.1 (H) 02/15/2018     Assessment and Plan:  See Problem List for Assessment and Plan of chronic medical problems.   Follow Up Instructions:    I discussed the assessment and treatment plan with the patient. The patient was provided an opportunity to ask questions and all were answered. The patient agreed with the plan and demonstrated an understanding of the instructions.   The patient was advised to call back or seek an in-person evaluation if the symptoms worsen or if the condition fails to improve as anticipated.  FU in august with me in the office  Binnie Rail, MD

## 2018-10-27 ENCOUNTER — Encounter: Payer: Self-pay | Admitting: Internal Medicine

## 2018-10-27 ENCOUNTER — Ambulatory Visit (INDEPENDENT_AMBULATORY_CARE_PROVIDER_SITE_OTHER): Payer: Medicare Other | Admitting: Internal Medicine

## 2018-10-27 DIAGNOSIS — G4700 Insomnia, unspecified: Secondary | ICD-10-CM | POA: Diagnosis not present

## 2018-10-27 DIAGNOSIS — E039 Hypothyroidism, unspecified: Secondary | ICD-10-CM

## 2018-10-27 DIAGNOSIS — I480 Paroxysmal atrial fibrillation: Secondary | ICD-10-CM | POA: Diagnosis not present

## 2018-10-27 DIAGNOSIS — E785 Hyperlipidemia, unspecified: Secondary | ICD-10-CM

## 2018-10-27 DIAGNOSIS — I10 Essential (primary) hypertension: Secondary | ICD-10-CM | POA: Diagnosis not present

## 2018-10-27 DIAGNOSIS — M109 Gout, unspecified: Secondary | ICD-10-CM | POA: Diagnosis not present

## 2018-10-27 DIAGNOSIS — E119 Type 2 diabetes mellitus without complications: Secondary | ICD-10-CM

## 2018-10-27 MED ORDER — BLOOD GLUCOSE MONITOR KIT: PACK | 0 refills | Status: DC

## 2018-10-27 NOTE — Assessment & Plan Note (Signed)
Continue statin. 

## 2018-10-27 NOTE — Assessment & Plan Note (Signed)
No recent gout symptoms Continue daily allopurinol

## 2018-10-27 NOTE — Assessment & Plan Note (Signed)
Last A1c 7.1% He is interested in checking his sugars at home-we will resend glucometer prescription to pharmacy Continue current medication Follow-up in August and we will check A1c at that time

## 2018-10-27 NOTE — Assessment & Plan Note (Signed)
Following with cardiology Currently not on Eliquis secondary to fall and subdural hematoma 03/06/2018 Concern for possible recent TIA-he understands the risks and benefits of being on and off Eliquis and will continue to discuss with both neurology and cardiology, but at this time he thinks he wants to remain off of the medication A. fib asymptomatic-?  Intermittent or not Has upcoming cardiology follow-up

## 2018-10-27 NOTE — Assessment & Plan Note (Signed)
Taking either Ambien or Sonata-after taking 1 for certain amount of time it is ineffective and he will switch to the other one He understands he cannot take both at the same time and I try only to fill 1 at a time

## 2018-10-27 NOTE — Assessment & Plan Note (Signed)
BP Readings from Last 3 Encounters:  09/16/18 (!) 146/78  08/19/18 124/78  06/27/18 (!) 128/52   BP controlled Current regimen effective and well tolerated Continue current medications at current doses

## 2018-10-27 NOTE — Assessment & Plan Note (Signed)
Clinically euthyroid Follow-up in August and we will do blood work at that time

## 2018-11-11 ENCOUNTER — Encounter: Payer: Self-pay | Admitting: Internal Medicine

## 2018-11-11 ENCOUNTER — Other Ambulatory Visit (INDEPENDENT_AMBULATORY_CARE_PROVIDER_SITE_OTHER): Payer: Medicare Other

## 2018-11-11 ENCOUNTER — Ambulatory Visit (INDEPENDENT_AMBULATORY_CARE_PROVIDER_SITE_OTHER): Payer: Medicare Other | Admitting: Internal Medicine

## 2018-11-11 DIAGNOSIS — G4730 Sleep apnea, unspecified: Secondary | ICD-10-CM | POA: Diagnosis not present

## 2018-11-11 DIAGNOSIS — E039 Hypothyroidism, unspecified: Secondary | ICD-10-CM

## 2018-11-11 DIAGNOSIS — Z794 Long term (current) use of insulin: Secondary | ICD-10-CM

## 2018-11-11 DIAGNOSIS — R5383 Other fatigue: Secondary | ICD-10-CM

## 2018-11-11 DIAGNOSIS — I10 Essential (primary) hypertension: Secondary | ICD-10-CM | POA: Diagnosis not present

## 2018-11-11 DIAGNOSIS — I480 Paroxysmal atrial fibrillation: Secondary | ICD-10-CM

## 2018-11-11 DIAGNOSIS — R2681 Unsteadiness on feet: Secondary | ICD-10-CM

## 2018-11-11 DIAGNOSIS — R413 Other amnesia: Secondary | ICD-10-CM

## 2018-11-11 DIAGNOSIS — E119 Type 2 diabetes mellitus without complications: Secondary | ICD-10-CM

## 2018-11-11 LAB — LIPID PANEL
Cholesterol: 141 mg/dL (ref 0–200)
HDL: 49.2 mg/dL (ref >39.00)
LDL Cholesterol: 66 mg/dL (ref 0–99)
NonHDL: 91.77
Total CHOL/HDL Ratio: 3
Triglycerides: 130 mg/dL (ref 0.0–149.0)
VLDL: 26 mg/dL (ref 0.0–40.0)

## 2018-11-11 LAB — COMPREHENSIVE METABOLIC PANEL
ALT: 14 U/L (ref 0–53)
AST: 13 U/L (ref 0–37)
Albumin: 4.3 g/dL (ref 3.5–5.2)
Alkaline Phosphatase: 84 U/L (ref 39–117)
BUN: 20 mg/dL (ref 6–23)
CO2: 30 mEq/L (ref 19–32)
Calcium: 9.2 mg/dL (ref 8.4–10.5)
Chloride: 102 mEq/L (ref 96–112)
Creatinine, Ser: 1.03 mg/dL (ref 0.40–1.50)
GFR: 68.9 mL/min (ref >60.00)
Glucose, Bld: 119 mg/dL — ABNORMAL HIGH (ref 70–99)
Potassium: 3.7 mEq/L (ref 3.5–5.1)
Sodium: 141 mEq/L (ref 135–145)
Total Bilirubin: 0.5 mg/dL (ref 0.2–1.2)
Total Protein: 6.4 g/dL (ref 6.0–8.3)

## 2018-11-11 LAB — CBC WITH DIFFERENTIAL/PLATELET
Basophils Absolute: 0 10*3/uL (ref 0.0–0.1)
Basophils Relative: 0.2 % (ref 0.0–3.0)
Eosinophils Absolute: 0 10*3/uL (ref 0.0–0.7)
Eosinophils Relative: 0.2 % (ref 0.0–5.0)
HCT: 41.8 % (ref 39.0–52.0)
Hemoglobin: 14.1 g/dL (ref 13.0–17.0)
Lymphocytes Relative: 33.9 % (ref 12.0–46.0)
Lymphs Abs: 2.1 10*3/uL (ref 0.7–4.0)
MCHC: 33.7 g/dL (ref 30.0–36.0)
MCV: 91.4 fl (ref 78.0–100.0)
Monocytes Absolute: 1.5 10*3/uL — ABNORMAL HIGH (ref 0.1–1.0)
Monocytes Relative: 24.3 % — ABNORMAL HIGH (ref 3.0–12.0)
Neutro Abs: 2.6 10*3/uL (ref 1.4–7.7)
Neutrophils Relative %: 41.4 % — ABNORMAL LOW (ref 43.0–77.0)
Platelets: 104 10*3/uL — ABNORMAL LOW (ref 150.0–400.0)
RBC: 4.57 Mil/uL (ref 4.22–5.81)
RDW: 13.8 % (ref 11.5–15.5)
WBC: 6.3 10*3/uL (ref 4.0–10.5)

## 2018-11-11 LAB — TSH: TSH: 3.79 u[IU]/mL (ref 0.35–4.50)

## 2018-11-11 LAB — HEMOGLOBIN A1C: Hgb A1c MFr Bld: 7.2 % — ABNORMAL HIGH (ref 4.6–6.5)

## 2018-11-11 MED ORDER — APIXABAN 5 MG PO TABS: 5.0000 mg | ORAL_TABLET | Freq: Two times a day (BID) | ORAL | 1 refills | Status: DC

## 2018-11-11 NOTE — Assessment & Plan Note (Signed)
Blood pressure variable recently at home, but overall on average it is controlled At this point I will have him continue to monitor we will not make any adjustments to his medication We will have him follow-up with me in 1 month and we can discuss his numbers at that time to see if adjustment is needed

## 2018-11-11 NOTE — Assessment & Plan Note (Signed)
A1c

## 2018-11-11 NOTE — Assessment & Plan Note (Signed)
Has not discussed with cardiology restarting Eliquis-discussed that I think he needs to be on it because the possibility of TIAs or strokes is very concerning He agrees to restart it-prescription sent to pharmacy

## 2018-11-11 NOTE — Assessment & Plan Note (Signed)
He continues to have difficulty with his memory He did see Dr. Si Raider at [facility] neurolog[facility] for evaluation of his memory and last fall want to be retested, but she ended up leaving the practice and he was not retested He did start Lyrica back a couple of weeks ago and this may have made his memory worse Discussed that likely some of his medications are contributing and that ideally he should come off of some There is also concern that he is or has had strokes because he has not been on the Eliquis-we will restart the Eliquis Will refer to neurology for evaluation

## 2018-11-11 NOTE — Assessment & Plan Note (Addendum)
This is not a new complaint, but may have gotten worse in the past couple of weeks-he has had this complaint in the past He is using his CPAP, but has been taking a nap in the morning after getting a good night sleep His medications is likely contributing he is on multiple medications and several of them are sedating Discussed trying to decrease some of his medications-we will discontinue the tizanidine to see if that helps He will also hold the Lyrica to see if that helps Check labs including CBC, TSH, CMP Reevaluate in 1 month

## 2018-11-11 NOTE — Assessment & Plan Note (Signed)
Having increased fatigue We will check TSH

## 2018-11-11 NOTE — Assessment & Plan Note (Signed)
Poor balance at times and he is stumbling on occasion No recent falls This is not a new complaint, but appears to have gotten worse in the past couple of weeks Discussed that this could be multifactorial-his medications are likely contributing, inactivity, osteoarthritis We will discontinue tizanidine to see if that helps 2 weeks ago he started back on Lyrica and that may be contributing-he will discontinue that for now to see if this improves Will refer to neurology for further evaluation Discussed physical therapy, but at this time he would like to hold off because of the coronavirus situation We will reevaluate in a month

## 2018-11-11 NOTE — Assessment & Plan Note (Signed)
Following with pulmonary He is religious about using CPAP at night Taking a nap in the morning is not necessarily new-he has discussed this with pulmonary Advised that he should not be drinking any alcohol with his current medications We will try discontinuing the tizanidine-he is not sure if this works and this could be causing some of the instability or drowsiness

## 2018-11-11 NOTE — Progress Notes (Signed)
Virtual Visit via Video Note  I connected with [name] on 11/11/18 at 10:00 AM EDT by a video enabled telemedicine application and verified that I am speaking with the correct person using two identifiers.   I discussed the limitations of evaluation and management by telemedicine and the availability of in person appointments. The patient expressed understanding and agreed to proceed.  The patient is currently at home and I am in the office.  His wife is also with him.  She helps provide some history.  No referring provider.    History of Present Illness: This is an acute visit for possible recurrent TIAs, fluctuating BP, falling asleep frequently, walking instability, vision changes, short term memory problems, needs to go back on lyrica.  His wife states that she has seen a decline in him over the past 1-2 weeks.  She states some days he has no energy.  He has noticed that if he sits he will fall asleep and sleep for a couple of hours, which is unusual.  He is more unsteady and tends to stumble, but has not fallen.  She states his short-term memory is gone-he cannot remember anything over the past couple of days.  He agrees with all of this.  He states he is sleeping well and he is using his CPAP machine nightly.  He is unsure the last time it was interrogated.  Some mornings he has been getting up earlier than usual and after an hour of being up will feel tired and will take a nap then.  He often states lack of energy.  Some days it has been hard for him to walk up the stairs in his house, which is unusual.  He knows that his severe knee arthritis contributes to that.  He is unsteady at times.  We discussed at her last visit restarting Eliquis-this was discontinued last fall after he fell and had a subdural hematoma.  At our visit he wanted to discuss this with cardiology, but he has not yet had the opportunity to do that so he has not been taking it.  He is concerned about the  possibility of TIAs.  At night he alternates between taking Ambien or Sonata because after a while whenever he is taking does not work.  He also will at times take oxycodone and he takes the tizanidine nightly.  If he is unable to get to sleep he will get up and read a little bit and drinks 3 ounces of bourbon, which helps him get back to sleep.  He has fibromyalgia and used to be on Lyrica.  He wonders if he needs to restart this.  He did have some leftover and started taking it again about 2 weeks ago.  He is unsure if that correlates to some of his symptoms.  His blood pressure had been fluctuating.  He has had a recent blood pressure of 108/?  And today it was 160/86.  He thinks on average is 130-135/86.  He has not seen any other low blood pressures and it typically is not as high as 160.  He is unsure why it has been variable.   ROS As above  Social History   Socioeconomic History  . Marital status: Married    Spouse name: Not on file  . Number of children: 2  . Years of education: Not on file  . Highest education level: Not on file  Occupational History  . Occupation: RETIRED    Employer: RETIRED  Social  Needs  . Financial resource strain: Somewhat hard  . Food insecurity:    Worry: Never true    Inability: Never true  . Transportation needs:    Medical: No    Non-medical: No  Tobacco Use  . Smoking status: Former Smoker    Packs/day: 2.00    Years: 10.00    Pack years: 20.00    Types: Cigarettes    Last attempt to quit: 07/18/1975    Years since quitting: 43.3  . Smokeless tobacco: Never Used  Substance and Sexual Activity  . Alcohol use: Yes    Comment: 1-2 drinks/day  . Drug use: No  . Sexual activity: Not Currently  Lifestyle  . Physical activity:    Days per week: 0 days    Minutes per session: 0 min  . Stress: Not at all  Relationships  . Social connections:    Talks on phone: More than three times a week    Gets together: More than three times a week     Attends religious service: Never    Active member of club or organization: Yes    Attends meetings of clubs or organizations: More than 4 times per year    Relationship status: Married  Other Topics Concern  . Not on file  Social History Narrative  . Not on file     Observations/Objective: Appears well in NAD Normal mood and affect, thinking normal, but difficulty recalling certain things  Assessment and Plan:  See Problem List for Assessment and Plan of chronic medical problems.   Follow Up Instructions:    I discussed the assessment and treatment plan with the patient. The patient was provided an opportunity to ask questions and all were answered. The patient agreed with the plan and demonstrated an understanding of the instructions.   The patient was advised to call back or seek an in-person evaluation if the symptoms worsen or if the condition fails to improve as anticipated.  FU in one month  Binnie Rail, MD

## 2018-11-24 DIAGNOSIS — M47817 Spondylosis without myelopathy or radiculopathy, lumbosacral region: Secondary | ICD-10-CM | POA: Diagnosis not present

## 2018-11-27 ENCOUNTER — Other Ambulatory Visit: Payer: Self-pay | Admitting: Internal Medicine

## 2018-11-29 ENCOUNTER — Other Ambulatory Visit: Payer: Self-pay

## 2018-11-29 MED ORDER — LANCETS MISC: 3 refills | Status: DC

## 2018-11-29 MED ORDER — GLUCOSE BLOOD VI STRP: ORAL_STRIP | 3 refills | Status: DC

## 2018-11-30 ENCOUNTER — Encounter: Payer: Self-pay | Admitting: Internal Medicine

## 2018-12-01 ENCOUNTER — Encounter: Payer: Self-pay | Admitting: Internal Medicine

## 2018-12-05 ENCOUNTER — Other Ambulatory Visit: Payer: Self-pay

## 2018-12-08 ENCOUNTER — Other Ambulatory Visit: Payer: Self-pay

## 2018-12-08 DIAGNOSIS — M47817 Spondylosis without myelopathy or radiculopathy, lumbosacral region: Secondary | ICD-10-CM | POA: Diagnosis not present

## 2018-12-08 DIAGNOSIS — M47814 Spondylosis without myelopathy or radiculopathy, thoracic region: Secondary | ICD-10-CM | POA: Diagnosis not present

## 2018-12-08 MED ORDER — BLOOD GLUCOSE MONITOR KIT: PACK | 0 refills | Status: DC

## 2018-12-09 DIAGNOSIS — M25561 Pain in right knee: Secondary | ICD-10-CM | POA: Diagnosis not present

## 2018-12-09 DIAGNOSIS — M1712 Unilateral primary osteoarthritis, left knee: Secondary | ICD-10-CM | POA: Diagnosis not present

## 2018-12-14 ENCOUNTER — Other Ambulatory Visit: Payer: Self-pay | Admitting: Internal Medicine

## 2018-12-15 NOTE — Telephone Encounter (Signed)
Not sure if both of these should be filled.  Zopidem last refilled 09/14/18 Zaleplon last refilled 06/27/18  Last OV 10/27/18 Next OV 02/09/19

## 2018-12-16 NOTE — Telephone Encounter (Signed)
Can not fill both sleep medications - can only fill one - amiben sent to [facility]

## 2018-12-21 ENCOUNTER — Ambulatory Visit: Payer: Self-pay | Admitting: *Deleted

## 2018-12-21 NOTE — Telephone Encounter (Signed)
Relation to pt: self  Call back number: [phone]    Reason for call:  Patient experiencing 100.5 fever and chills, reached out to NT unsuccessful advised patient nurse will f/u with a call, please advise   Call to patient- patient has had fever for 24 hours- tylenol is not bringing it down. Patient is having chills. Patient does not report cough or respiratory symptoms. Patient is a high risk patient due to medical history- office phones busy- call sent high priority for scheduling.  Reason for Disposition . [1] Fever > 100.0 F (37.8 C) AND [2] diabetes mellitus or weak immune system (e.g., HIV positive, cancer chemo, splenectomy, organ transplant, chronic steroids)  Answer Assessment - Initial Assessment Questions 1. TEMPERATURE: "What is the most recent temperature?"  "How was it measured?"      100.4- 1 hour ago, oral digital 2. ONSET: "When did the fever start?"      Last night 3. SYMPTOMS: "Do you have any other symptoms besides the fever?"  (e.g., colds, headache, sore throat, earache, cough, rash, diarrhea, vomiting, abdominal pain)     Nausea, frequent urination, frequent BM- not diarrhea 4. CAUSE: If there are no symptoms, ask: "What do you think is causing the fever?"      unknown 5. CONTACTS: "Does anyone else in the family have an infection?"     no 6. TREATMENT: "What have you done so far to treat this fever?" (e.g., medications)     tylenonl 7. IMMUNOCOMPROMISE: "Do you have of the following: diabetes, HIV positive, splenectomy, cancer chemotherapy, chronic steroid treatment, transplant patient, etc."     Heart and lung disease, COPD 8. PREGNANCY: "Is there any chance you are pregnant?" "When was your last menstrual period?"     n/a 9. TRAVEL: "Have you traveled out of the country in the last month?" (e.g., travel history, exposures)     No travel  Protocols used: FEVER-A-AH

## 2018-12-21 NOTE — Progress Notes (Signed)
Virtual Visit via Video Note  I connected with Jonathan [name] on 12/22/18 at  9:30 AM EDT by a video enabled telemedicine application and verified that I am speaking with the correct person using two identifiers.   I discussed the limitations of evaluation and management by telemedicine and the availability of in person appointments. The patient expressed understanding and agreed to proceed.  The patient is currently at home and I am in the office.    No referring provider.    History of Present Illness: This is an acute visit for fever, chills.  Two nights ago he had tremors and his temp has been 100.4-100.7 since then.  He has been taking tylenol.  He has been experiencing diarrhea more than 5 times a day.  He states the diarrhea is not completely liquid, just soft stool.  He has had some nausea and vomiting.  He also states frequent urination, but denies any dysuria, hematuria or change in the urine color or smell.  He has had some lightheadedness and some confusion.  He thinks he has been careful regarding exposure to COVID-19, but wondered if he should be tested.  He has been drinking plenty of fluids, but has not been eating much.   Review of Systems  Constitutional: Positive for chills and fever.  HENT: Negative for congestion, ear pain, sinus pain and sore throat.   Respiratory: Negative for cough, shortness of breath and wheezing.   Gastrointestinal: Positive for diarrhea, nausea and vomiting. Negative for abdominal pain and blood in stool.  Genitourinary: Positive for frequency (10-15 times last night). Negative for dysuria.       Urinary incontinence, no change in color or smell  Musculoskeletal: Negative for myalgias.  Neurological: Negative for dizziness and headaches.       Some lightheadedness  Psychiatric/Behavioral:       Confusion      Social History   Socioeconomic History  . Marital status: Married    Spouse name: Not on file  . Number of children: 2  .  Years of education: Not on file  . Highest education level: Not on file  Occupational History  . Occupation: RETIRED    Employer: RETIRED  Social Needs  . Financial resource strain: Somewhat hard  . Food insecurity    Worry: Never true    Inability: Never true  . Transportation needs    Medical: No    Non-medical: No  Tobacco Use  . Smoking status: Former Smoker    Packs/day: 2.00    Years: 10.00    Pack years: 20.00    Types: Cigarettes    Quit date: 07/18/1975    Years since quitting: 43.4  . Smokeless tobacco: Never Used  Substance and Sexual Activity  . Alcohol use: Yes    Comment: 1-2 drinks/day  . Drug use: No  . Sexual activity: Not Currently  Lifestyle  . Physical activity    Days per week: 0 days    Minutes per session: 0 min  . Stress: Not at all  Relationships  . Social connections    Talks on phone: More than three times a week    Gets together: More than three times a week    Attends religious service: Never    Active member of club or organization: Yes    Attends meetings of clubs or organizations: More than 4 times per year    Relationship status: Married  Other Topics Concern  . Not on file  Social History  Narrative  . Not on file     Observations/Objective: Appears well in NAD Breathing normally  Assessment and Plan:  See Problem List for Assessment and Plan of chronic medical problems.   Follow Up Instructions:    I discussed the assessment and treatment plan with the patient. The patient was provided an opportunity to ask questions and all were answered. The patient agreed with the plan and demonstrated an understanding of the instructions.   The patient was advised to call back or seek an in-person evaluation if the symptoms worsen or if the condition fails to improve as anticipated.    Binnie Rail, MD

## 2018-12-21 NOTE — Telephone Encounter (Signed)
Virtual scheduled  °

## 2018-12-22 ENCOUNTER — Other Ambulatory Visit: Payer: Self-pay | Admitting: Internal Medicine

## 2018-12-22 ENCOUNTER — Other Ambulatory Visit (INDEPENDENT_AMBULATORY_CARE_PROVIDER_SITE_OTHER): Payer: Medicare Other

## 2018-12-22 ENCOUNTER — Telehealth: Payer: Self-pay | Admitting: *Deleted

## 2018-12-22 ENCOUNTER — Ambulatory Visit (INDEPENDENT_AMBULATORY_CARE_PROVIDER_SITE_OTHER): Payer: Medicare Other | Admitting: Internal Medicine

## 2018-12-22 ENCOUNTER — Other Ambulatory Visit: Payer: Self-pay

## 2018-12-22 ENCOUNTER — Encounter: Payer: Self-pay | Admitting: Internal Medicine

## 2018-12-22 ENCOUNTER — Telehealth: Payer: Self-pay

## 2018-12-22 DIAGNOSIS — R197 Diarrhea, unspecified: Secondary | ICD-10-CM | POA: Insufficient documentation

## 2018-12-22 DIAGNOSIS — R509 Fever, unspecified: Secondary | ICD-10-CM | POA: Diagnosis not present

## 2018-12-22 DIAGNOSIS — R350 Frequency of micturition: Secondary | ICD-10-CM | POA: Diagnosis not present

## 2018-12-22 DIAGNOSIS — Z20822 Contact with and (suspected) exposure to covid-19: Secondary | ICD-10-CM

## 2018-12-22 DIAGNOSIS — Z20828 Contact with and (suspected) exposure to other viral communicable diseases: Secondary | ICD-10-CM

## 2018-12-22 LAB — URINALYSIS, ROUTINE W REFLEX MICROSCOPIC
Bilirubin Urine: NEGATIVE
Hgb urine dipstick: NEGATIVE
Leukocytes,Ua: NEGATIVE
Nitrite: NEGATIVE
RBC / HPF: NONE SEEN (ref 0–?)
Specific Gravity, Urine: >=1.03 — AB (ref 1.000–1.030)
Total Protein, Urine: 100 — AB
Urine Glucose: NEGATIVE
Urobilinogen, UA: 0.2 (ref 0.0–1.0)
pH: 5 (ref 5.0–8.0)

## 2018-12-22 NOTE — Telephone Encounter (Signed)
Dr. Quay Burow would like pt to be tested for COVID due to having fever and chills. Call back number [phone].

## 2018-12-22 NOTE — Assessment & Plan Note (Signed)
Having diarrhea, nausea, vomiting and fever Will be tested for COVID-19 Will rule out UTI given urinary frequency Bland diet, continue increased fluids If diarrhea does not improve will need stool studies and further evaluation by GI

## 2018-12-22 NOTE — Assessment & Plan Note (Signed)
Having fever of 100.4-100.7 Also experiencing increased urination, diarrhea/nausea/vomiting Concerning for infection Will have him tested for COVID Will check urinalysis, urine culture to rule out a UTI May need to have stool studies depending on above

## 2018-12-22 NOTE — Telephone Encounter (Signed)
Pt scheduled for covid-19 testing at [facility] tomorrow at 12:45. Advised to wear a mask, stay in car with windows rolled up until ready for testing. This is a drive thru test site.  Pt referred by Dr Quay Burow.

## 2018-12-22 NOTE — Assessment & Plan Note (Signed)
Has had a significant increase in urination and there is a possibility of a UTI This could be causing some of his other symptoms Will check urinalysis, urine culture Should ideally have blood work and further evaluation, but until his COVID test is negative we do not want him coming into the office or the lab His wife will pick up the container for his urine test

## 2018-12-23 ENCOUNTER — Other Ambulatory Visit: Payer: Self-pay | Admitting: Internal Medicine

## 2018-12-23 ENCOUNTER — Other Ambulatory Visit: Payer: Medicare Other

## 2018-12-23 DIAGNOSIS — Z20822 Contact with and (suspected) exposure to covid-19: Secondary | ICD-10-CM

## 2018-12-23 DIAGNOSIS — R6889 Other general symptoms and signs: Secondary | ICD-10-CM | POA: Diagnosis not present

## 2018-12-23 MED ORDER — CIPROFLOXACIN HCL 500 MG PO TABS: 500.0000 mg | ORAL_TABLET | Freq: Two times a day (BID) | ORAL | 0 refills | Status: AC

## 2018-12-24 LAB — URINE CULTURE
MICRO NUMBER:: 651766
SPECIMEN QUALITY:: ADEQUATE

## 2018-12-25 ENCOUNTER — Encounter: Payer: Self-pay | Admitting: Internal Medicine

## 2018-12-29 LAB — NOVEL CORONAVIRUS, NAA: SARS-CoV-2, NAA: NOT DETECTED

## 2018-12-29 NOTE — Progress Notes (Signed)
Virtual Visit via Video Note changed to phone visit due to technical difficulties   This visit type was conducted due to national recommendations for restrictions regarding the COVID-19 Pandemic (e.g. social distancing) in an effort to limit this patient's exposure and mitigate transmission in our community.  Due to his co-morbid illnesses, this patient is at least at moderate risk for complications without adequate follow up.  This format is felt to be most appropriate for this patient at this time.  All issues noted in this document were discussed and addressed.  A limited physical exam was performed with this format.  Please refer to the patient's chart for his consent to telehealth for [facility].   Date:  01/10/2019   ID:  Jonathan [name], DOB 1932-09-06, MRN [id number]  Patient Location:Home Provider Location: Home  PCP:  Binnie Rail, MD  Cardiologist:  Dr Stanford Breed  Evaluation Performed:  Follow-Up Visit  Chief Complaint:  FU atrial fibrillation  History of Present Illness:    FU atrial fibrillation. Previously with a fib but converted spontaneously to sinus rhythm. Patient also with significant COPD. Abdominal ultrasound March 2015 showed no aneurysm. Nuclear study July 2016 showed ejection fraction 56% and normal perfusion. Echocardiogram repeated August 2018 and showed normal LV function, grade 2 diastolic dysfunction and mild left atrial enlargement. Monitor August 2018 showed sinus rhythm with PACs, PVCs and PAF. Monitor repeated July 2019.  Sinus rhythm with PACs, PVCs, rare couplet and 3 beats nonsustained ventricular tachycardia.  Had fall September 2019 resulting in subdural hematoma.  Patient was to follow-up with neurosurgery and then resume apixaban at their discretion if possible.  Since I last sawhim,the patient has dyspnea with more extreme activities but not with routine activities. It is relieved with rest. It is not associated with chest pain. There is no  orthopnea, PND or pedal edema. There is no syncope or palpitations. There is no exertional chest pain.   The patient does not have symptoms concerning for COVID-19 infection (fever, chills, cough, or new shortness of breath).    Past Medical History:  Diagnosis Date   Atrial fibrillation ([place])    a. paroxysmal, on Eliquis for anticoagulation   BPH (benign prostatic hyperplasia)    Bronchitis    Cataract    COPD (chronic obstructive pulmonary disease) (HCC)    2 liters O2 HS   Depression    Fatty tumor    waste and back   Fibromyalgia    GERD (gastroesophageal reflux disease)    Glaucoma    bilateral eyes   Gout    Hereditary and idiopathic peripheral neuropathy 08/28/2015   Hypertension    Hypothyroidism    Hypoxia    Insomnia    Memory disorder 08/28/2015   On home oxygen therapy    at night with cpap   Osteoarthritis    Osteoporosis    Peripheral neuropathy    Rotator cuff tear, left    Sleep apnea    uses cpap-add oxygen at night   Spinal compression fracture (HCC) seventh vertebre   Transient alteration of awareness 08/28/2015   Type 2 diabetes mellitus ([place]) 03/08/2016   Wears glasses    Past Surgical History:  Procedure Laterality Date   APPENDECTOMY  age 8   CARPAL TUNNEL RELEASE Right    early 2000s   CATARACT EXTRACTION     bilateral   CHOLECYSTECTOMY  age 3   EYE SURGERY     FOOT ARTHRODESIS Right 02/02/2013   Procedure: RIGHT  HALLUX METATARSAL PHALANGEAL JOINT ARTHRODESIS ;  Surgeon: Wylene Simmer, MD;  Location: [place];  Service: Orthopedics;  Laterality: Right;   INGUINAL HERNIA REPAIR  age 19   rt side   NASAL CONCHA BULLOSA RESECTION  age 82   PROSTATE SURGERY     SHOULDER ARTHROSCOPY W/ ROTATOR CUFF REPAIR Right    early 2000s   tonsil     VASECTOMY  age 71     Current Meds  Medication Sig   acetaminophen (TYLENOL) 500 MG tablet Take 2 tablets (1,000 mg total) by mouth every 8  (eight) hours as needed. (Patient taking differently: Take 1,000 mg by mouth 2 (two) times daily. )   allopurinol (ZYLOPRIM) 300 MG tablet TAKE 1 TABLET BY MOUTH  EVERY EVENING   apixaban (ELIQUIS) 5 MG TABS tablet Take 1 tablet (5 mg total) by mouth 2 (two) times daily.   atorvastatin (LIPITOR) 20 MG tablet Take 1 tablet (20 mg total) by mouth daily at 6 PM.   Biotin 5000 MCG TABS Take 5,000 mcg by mouth daily.   blood glucose meter kit and supplies KIT Dispense based on patient and insurance preference. Use up to four times daily as directed. (FOR E11.9).   brimonidine (ALPHAGAN) 0.15 % ophthalmic solution Place 1 drop into the left eye 2 (two) times daily.    calcium-vitamin D (OSCAL WITH D) 500-200 MG-UNIT tablet Take 1 tablet by mouth daily with breakfast.   colchicine (COLCRYS) 0.6 MG tablet Take 0.6 mg by mouth daily as needed (flareups).    diltiazem (CARDIZEM CD) 300 MG 24 hr capsule TAKE 1 CAPSULE BY MOUTH  DAILY   doxazosin (CARDURA) 8 MG tablet TAKE 1 TABLET BY MOUTH  DAILY. -- OFFICE VISIT  NEEDED FOR FURTHER REFILLS   DULoxetine (CYMBALTA) 30 MG capsule TAKE 2 CAPSULES BY MOUTH IN THE MORNING AND 1 CAPSULE  BY MOUTH IN THE EVENING   glucose blood test strip Use to check blood sugar daily. E11.9   hydrochlorothiazide (HYDRODIURIL) 25 MG tablet TAKE 1 TABLET BY MOUTH  DAILY   Lancets MISC Use to check blood sugars daily. E11.9   latanoprost (XALATAN) 0.005 % ophthalmic solution Place 1 drop into both eyes at bedtime.   levalbuterol (XOPENEX HFA) 45 MCG/ACT inhaler Inhale 1-2 puffs into the lungs every 4 (four) hours as needed for wheezing.   levothyroxine (SYNTHROID, LEVOTHROID) 112 MCG tablet TAKE 1 TABLET BY MOUTH EVERY DAY   magnesium oxide (MAG-OX) 400 MG tablet Take 400 mg by mouth daily.   metFORMIN (GLUCOPHAGE) 500 MG tablet Take 1 tablet by mouth 2 times daily with meal.   mometasone (NASONEX) 50 MCG/ACT nasal spray Place 2 sprays into the nose daily.    Multiple Vitamin (MULTIVITAMIN) tablet Take 1 tablet by mouth daily.   nitroGLYCERIN (NITROSTAT) 0.4 MG SL tablet Place 1 tablet (0.4 mg total) under the tongue every 5 (five) minutes as needed for chest pain.   omeprazole (PRILOSEC) 20 MG capsule TAKE 1 CAPSULE BY MOUTH  DAILY   oxyCODONE-acetaminophen (PERCOCET) 10-325 MG tablet Take 1 tablet by mouth every 8 (eight) hours as needed for pain.   tadalafil (CIALIS) 20 MG tablet Take 1 tablet (20 mg total) by mouth daily as needed. For erectile dysfunction   tolterodine (DETROL LA) 4 MG 24 hr capsule Take 4 mg by mouth daily.   vitamin C (ASCORBIC ACID) 500 MG tablet Take 1,000 mg by mouth daily.    VITAMIN D, CHOLECALCIFEROL, PO Take 50  mcg by mouth daily.   zaleplon (SONATA) 10 MG capsule TAKE 1 CAPSULE BY MOUTH AT BEDTIME AS NEEDED FOR SLEEP   zolpidem (AMBIEN) 10 MG tablet TAKE 1 TABLET (10 MG TOTAL) BY MOUTH AT BEDTIME AS NEEDED FOR SLEEP.     Allergies:   Other   Social History   Tobacco Use   Smoking status: Former Smoker    Packs/day: 2.00    Years: 10.00    Pack years: 20.00    Types: Cigarettes    Quit date: 07/18/1975    Years since quitting: 43.5   Smokeless tobacco: Never Used  Substance Use Topics   Alcohol use: Yes    Comment: 1-2 drinks/day   Drug use: No     Family Hx: The patient's family history includes Arthritis in his mother; Coronary artery disease in his brother; Heart disease in his father; Kidney cancer in his father; Lung cancer in his father and mother; Prostate cancer in his father.  ROS:   Please see the history of present illness.    Back pain but no Fever, chills  or productive cough All other systems reviewed and are negative.  Recent Labs: 03/07/2018: Magnesium 2.2 11/11/2018: ALT 14; BUN 20; Creatinine, Ser 1.03; Hemoglobin 14.1; Platelets 104.0; Potassium 3.7; Sodium 141; TSH 3.79   Recent Lipid Panel Lab Results  Component Value Date/Time   CHOL 141 11/11/2018 11:14 AM   TRIG  130.0 11/11/2018 11:14 AM   HDL 49.20 11/11/2018 11:14 AM   CHOLHDL 3 11/11/2018 11:14 AM   LDLCALC 66 11/11/2018 11:14 AM    Wt Readings from Last 3 Encounters:  01/10/19 205 lb (93 kg)  09/16/18 205 lb (93 kg)  08/19/18 212 lb (96.2 kg)     Objective:    Vital Signs:  BP 133/86    Pulse 79    Ht 5' 7"  (1.702 m)    Wt 205 lb (93 kg)    BMI 32.11 kg/m    VITAL SIGNS:  reviewed NAD Answers questions appropriately Normal affect Remainder of physical examination not performed (telehealth visit; coronavirus pandemic)  ASSESSMENT & PLAN:    1. Paroxysmal atrial fibrillation-based on history patient remains in sinus rhythm.  He has resumed apixaban and is not falling; started as he thought he was having TIA symptoms. 2. Hypertension-patient's blood pressure is controlled today.  Continue present medications and follow. 3. Obstructive sleep apnea-continue CPAP.  COVID-19 Education: The importance of social distancing was discussed today.  Time:   Today, I have spent 18 minutes with the patient with telehealth technology discussing the above problems.     Medication Adjustments/Labs and Tests Ordered: Current medicines are reviewed at length with the patient today.  Concerns regarding medicines are outlined above.   Tests Ordered: No orders of the defined types were placed in this encounter.   Medication Changes: No orders of the defined types were placed in this encounter.   Follow Up:  Virtual Visit or In Person in 6 month(s)  Signed, Kirk Ruths, MD  01/10/2019 9:51 AM    [place]

## 2018-12-30 DIAGNOSIS — N3000 Acute cystitis without hematuria: Secondary | ICD-10-CM | POA: Diagnosis not present

## 2018-12-30 DIAGNOSIS — R3915 Urgency of urination: Secondary | ICD-10-CM | POA: Diagnosis not present

## 2018-12-30 DIAGNOSIS — N401 Enlarged prostate with lower urinary tract symptoms: Secondary | ICD-10-CM | POA: Diagnosis not present

## 2019-01-09 ENCOUNTER — Telehealth: Payer: Self-pay | Admitting: Cardiology

## 2019-01-09 NOTE — Telephone Encounter (Signed)

## 2019-01-10 ENCOUNTER — Other Ambulatory Visit: Payer: Self-pay

## 2019-01-10 ENCOUNTER — Telehealth (INDEPENDENT_AMBULATORY_CARE_PROVIDER_SITE_OTHER): Payer: Medicare Other | Admitting: Cardiology

## 2019-01-10 VITALS — BP 133/86 | HR 79 | Ht 67.0 in | Wt 205.0 lb

## 2019-01-10 DIAGNOSIS — G4733 Obstructive sleep apnea (adult) (pediatric): Secondary | ICD-10-CM | POA: Diagnosis not present

## 2019-01-10 DIAGNOSIS — I10 Essential (primary) hypertension: Secondary | ICD-10-CM | POA: Diagnosis not present

## 2019-01-10 DIAGNOSIS — Z9989 Dependence on other enabling machines and devices: Secondary | ICD-10-CM

## 2019-01-10 DIAGNOSIS — I480 Paroxysmal atrial fibrillation: Secondary | ICD-10-CM | POA: Diagnosis not present

## 2019-01-10 NOTE — Patient Instructions (Signed)

## 2019-01-11 DIAGNOSIS — Z79891 Long term (current) use of opiate analgesic: Secondary | ICD-10-CM | POA: Diagnosis not present

## 2019-01-11 DIAGNOSIS — M546 Pain in thoracic spine: Secondary | ICD-10-CM | POA: Diagnosis not present

## 2019-01-11 DIAGNOSIS — M1000 Idiopathic gout, unspecified site: Secondary | ICD-10-CM | POA: Diagnosis not present

## 2019-01-11 DIAGNOSIS — M5136 Other intervertebral disc degeneration, lumbar region: Secondary | ICD-10-CM | POA: Diagnosis not present

## 2019-01-11 DIAGNOSIS — M4854XS Collapsed vertebra, not elsewhere classified, thoracic region, sequela of fracture: Secondary | ICD-10-CM | POA: Diagnosis not present

## 2019-01-12 ENCOUNTER — Other Ambulatory Visit: Payer: Self-pay | Admitting: Specialist

## 2019-01-12 DIAGNOSIS — M546 Pain in thoracic spine: Secondary | ICD-10-CM

## 2019-01-17 ENCOUNTER — Other Ambulatory Visit: Payer: Self-pay | Admitting: Internal Medicine

## 2019-01-18 NOTE — Telephone Encounter (Signed)
12/16/18 fill, no print  Last OV 11/11/18

## 2019-01-20 DIAGNOSIS — M4854XS Collapsed vertebra, not elsewhere classified, thoracic region, sequela of fracture: Secondary | ICD-10-CM | POA: Diagnosis not present

## 2019-01-20 DIAGNOSIS — M4854XD Collapsed vertebra, not elsewhere classified, thoracic region, subsequent encounter for fracture with routine healing: Secondary | ICD-10-CM | POA: Diagnosis not present

## 2019-01-20 DIAGNOSIS — M5134 Other intervertebral disc degeneration, thoracic region: Secondary | ICD-10-CM | POA: Diagnosis not present

## 2019-01-22 ENCOUNTER — Other Ambulatory Visit: Payer: Self-pay | Admitting: Internal Medicine

## 2019-01-30 ENCOUNTER — Other Ambulatory Visit: Payer: Self-pay | Admitting: Internal Medicine

## 2019-01-30 DIAGNOSIS — M5136 Other intervertebral disc degeneration, lumbar region: Secondary | ICD-10-CM | POA: Diagnosis not present

## 2019-01-30 DIAGNOSIS — M810 Age-related osteoporosis without current pathological fracture: Secondary | ICD-10-CM | POA: Diagnosis not present

## 2019-01-30 DIAGNOSIS — M5134 Other intervertebral disc degeneration, thoracic region: Secondary | ICD-10-CM | POA: Diagnosis not present

## 2019-01-30 DIAGNOSIS — M4854XS Collapsed vertebra, not elsewhere classified, thoracic region, sequela of fracture: Secondary | ICD-10-CM | POA: Diagnosis not present

## 2019-01-30 DIAGNOSIS — M4854XD Collapsed vertebra, not elsewhere classified, thoracic region, subsequent encounter for fracture with routine healing: Secondary | ICD-10-CM | POA: Diagnosis not present

## 2019-01-31 DIAGNOSIS — M5136 Other intervertebral disc degeneration, lumbar region: Secondary | ICD-10-CM | POA: Diagnosis not present

## 2019-01-31 DIAGNOSIS — M4854XD Collapsed vertebra, not elsewhere classified, thoracic region, subsequent encounter for fracture with routine healing: Secondary | ICD-10-CM | POA: Diagnosis not present

## 2019-02-01 DIAGNOSIS — H401131 Primary open-angle glaucoma, bilateral, mild stage: Secondary | ICD-10-CM | POA: Diagnosis not present

## 2019-02-01 DIAGNOSIS — H04123 Dry eye syndrome of bilateral lacrimal glands: Secondary | ICD-10-CM | POA: Diagnosis not present

## 2019-02-01 DIAGNOSIS — H52203 Unspecified astigmatism, bilateral: Secondary | ICD-10-CM | POA: Diagnosis not present

## 2019-02-01 DIAGNOSIS — E119 Type 2 diabetes mellitus without complications: Secondary | ICD-10-CM | POA: Diagnosis not present

## 2019-02-01 LAB — HM DIABETES EYE EXAM

## 2019-02-02 DIAGNOSIS — M546 Pain in thoracic spine: Secondary | ICD-10-CM | POA: Diagnosis not present

## 2019-02-07 ENCOUNTER — Other Ambulatory Visit: Payer: Medicare Other

## 2019-02-07 DIAGNOSIS — M546 Pain in thoracic spine: Secondary | ICD-10-CM | POA: Diagnosis not present

## 2019-02-08 NOTE — Progress Notes (Signed)
Subjective:    Patient ID: Jonathan [name], male    DOB: 11-20-1932, 85 y.o.   MRN: [id number]  HPI The patient is here for follow up.  He is not exercising regularly.     Diabetes: He is taking his metformin once daily, but often forgets the second dose.  He wondered about the extended release formula.  He is compliant with a diabetic diet.   He checks his feet daily and denies foot lesions. He is up-to-date with an ophthalmology examination.   Afib, Hypertension: He is taking his medication daily. He is compliant with a low sodium diet.  He denies chest pain, palpitations, shortness of breath and regular headaches.     Insomnia:  He takes either [place] or sonata - he has to change between the two because after he uses one for a little while it stops working.   Hypothyroidism:  He is taking his medication daily.  He denies any recent changes in energy or weight that are unexplained.   Hyperlipidemia: He is taking his medication daily. He is compliant with a low fat/cholesterol diet. He denies myalgias.   Gout:  He takes allopurinol daily.  He denies gout symptoms.   Chronic back pain:  He is following with orthopedics and is on pain medications.  Knee pain, arthritis:  He follows with orthopedics and get injections.  He had an injection in his right knee recently.    GERD:  He is taking his medication daily as prescribed.  He denies any GERD symptoms and feels his GERD is well controlled.   Itchy scalp:  He has had an itchy scalp for about one year.  He denies obvious skin flakes or dandruff.  He did receive something from her dermatologist to put on morning and night, but he has not used it.    Mole on left lower leg - smooth, not growing, has been there for 6 months - 1 year.   Memory difficulties:  His memory is getting worse.  He often forgets when he is going to do or going to get.   He was evaluated last year with a year before at neurology.    Medications and allergies  reviewed with patient and updated if appropriate.  Patient Active Problem List   Diagnosis Date Noted  . Urination frequency 12/22/2018  . Fever 12/22/2018  . Diarrhea 12/22/2018  . Low energy 11/11/2018  . Gait instability 11/11/2018  . Acute cystitis without hematuria 05/18/2018  . Scalp abrasion, non-infected 03/16/2018  . Intracranial hemorrhage ([place]) 03/06/2018  . Fall   . Frequent bowel movements 02/15/2018  . Hair loss 02/15/2018  . Skin abnormalities 01/26/2018  . Chronic anticoagulation 12/21/2017  . Polypharmacy 12/21/2017  . Mid back pain on left side 08/10/2017  . Panic attack 08/09/2017  . COPD with acute exacerbation ([place]) 07/23/2017  . Spinal compression fracture ([place])   . Sleep apnea   . Rotator cuff tear, left   . Osteoarthritis   . On home oxygen therapy   . Hypoxia   . Fibromyalgia   . Fatty tumor   . Cataract   . BPH (benign prostatic hyperplasia)   . PAF (paroxysmal atrial fibrillation) ([place])   . Insomnia 03/10/2017  . Chronic pain, legs and back 03/10/2017  . Hyperlipidemia 03/10/2017  . Obesity (BMI 30.0-34.9) 12/08/2016  . Depression 09/07/2016  . Hypothyroidism 03/08/2016  . GERD (gastroesophageal reflux disease) 03/08/2016  . Gout 03/08/2016  . Glaucoma 03/08/2016  . Type 2  diabetes mellitus ([place]) 03/08/2016  . Hereditary and idiopathic peripheral neuropathy 08/28/2015  . Memory difficulties 08/28/2015  . Fatigue 01/08/2015  . Bruit 08/15/2013  . Essential hypertension 12/04/2011  . Allergic rhinitis, seasonal 12/04/2011  . Sleep apnea, obstructive 08/25/2011  . COPD (chronic obstructive pulmonary disease) ([place]) 07/18/2011    Current Outpatient Medications on File Prior to Visit  Medication Sig Dispense Refill  . acetaminophen (TYLENOL) 500 MG tablet Take 2 tablets (1,000 mg total) by mouth every 8 (eight) hours as needed. (Patient taking differently: Take 1,000 mg by mouth 2 (two) times daily. ) 360 tablet 1  . allopurinol (ZYLOPRIM)  300 MG tablet TAKE 1 TABLET BY MOUTH  EVERY EVENING 90 tablet 1  . apixaban (ELIQUIS) 5 MG TABS tablet Take 1 tablet (5 mg total) by mouth 2 (two) times daily. 180 tablet 1  . atorvastatin (LIPITOR) 20 MG tablet Take 1 tablet (20 mg total) by mouth daily at 6 PM. 90 tablet 1  . Biotin 5000 MCG TABS Take 5,000 mcg by mouth daily.    . blood glucose meter kit and supplies KIT Dispense based on patient and insurance preference. Use up to four times daily as directed. (FOR E11.9). 1 each 0  . brimonidine (ALPHAGAN) 0.15 % ophthalmic solution Place 1 drop into the left eye 2 (two) times daily.     . calcium-vitamin D (OSCAL WITH D) 500-200 MG-UNIT tablet Take 1 tablet by mouth daily with breakfast.    . colchicine (COLCRYS) 0.6 MG tablet Take 0.6 mg by mouth daily as needed (flareups).     . diltiazem (CARDIZEM CD) 300 MG 24 hr capsule TAKE 1 CAPSULE BY MOUTH  DAILY 90 capsule 1  . doxazosin (CARDURA) 8 MG tablet TAKE 1 TABLET BY MOUTH  DAILY. -- OFFICE VISIT  NEEDED FOR FURTHER REFILLS 90 tablet 0  . DULoxetine (CYMBALTA) 30 MG capsule TAKE 2 CAPSULES BY MOUTH IN THE MORNING AND 1 CAPSULE  IN THE EVENING 270 capsule 0  . glucose blood test strip Use to check blood sugar daily. E11.9 100 each 3  . hydrochlorothiazide (HYDRODIURIL) 25 MG tablet TAKE 1 TABLET BY MOUTH  DAILY 90 tablet 1  . Lancets MISC Use to check blood sugars daily. E11.9 100 each 3  . latanoprost (XALATAN) 0.005 % ophthalmic solution Place 1 drop into both eyes at bedtime. 7.5 mL 3  . levalbuterol (XOPENEX HFA) 45 MCG/ACT inhaler Inhale 1-2 puffs into the lungs every 4 (four) hours as needed for wheezing. 1 Inhaler 12  . levothyroxine (SYNTHROID, LEVOTHROID) 112 MCG tablet TAKE 1 TABLET BY MOUTH EVERY DAY 90 tablet 1  . magnesium oxide (MAG-OX) 400 MG tablet Take 400 mg by mouth daily.    . mometasone (NASONEX) 50 MCG/ACT nasal spray Place 2 sprays into the nose daily. 17 g 3  . Multiple Vitamin (MULTIVITAMIN) tablet Take 1 tablet by  mouth daily.    . nitroGLYCERIN (NITROSTAT) 0.4 MG SL tablet Place 1 tablet (0.4 mg total) under the tongue every 5 (five) minutes as needed for chest pain. 90 tablet 3  . omeprazole (PRILOSEC) 20 MG capsule TAKE 1 CAPSULE BY MOUTH  DAILY 90 capsule 0  . oxyCODONE-acetaminophen (PERCOCET) 10-325 MG tablet Take 1 tablet by mouth every 8 (eight) hours as needed for pain.    . potassium gluconate (EQL POTASSIUM GLUCONATE) 595 (99 K) MG TABS tablet Take 595 mg by mouth daily.    . tadalafil (CIALIS) 20 MG tablet Take 1 tablet (20  mg total) by mouth daily as needed. For erectile dysfunction 10 tablet 1  . tolterodine (DETROL LA) 4 MG 24 hr capsule Take 4 mg by mouth daily.    . vitamin C (ASCORBIC ACID) 500 MG tablet Take 1,000 mg by mouth daily.     Marland Kitchen VITAMIN D, CHOLECALCIFEROL, PO Take 50 mcg by mouth daily.    . zaleplon (SONATA) 10 MG capsule TAKE 1 CAPSULE BY MOUTH AT BEDTIME AS NEEDED FOR SLEEP 90 capsule 0  . zolpidem (AMBIEN) 10 MG tablet TAKE 1 TABLET (10 MG TOTAL) BY MOUTH AT BEDTIME AS NEEDED FOR SLEEP. 30 tablet 0  . [DISCONTINUED] metFORMIN (GLUCOPHAGE) 500 MG tablet Take 1 tablet by mouth 2 times daily with meal. 180 tablet 1   No current facility-administered medications on file prior to visit.     Past Medical History:  Diagnosis Date  . Atrial fibrillation (HCC)    a. paroxysmal, on Eliquis for anticoagulation  . BPH (benign prostatic hyperplasia)   . Bronchitis   . Cataract   . COPD (chronic obstructive pulmonary disease) (HCC)    2 liters O2 HS  . Depression   . Fatty tumor    waste and back  . Fibromyalgia   . GERD (gastroesophageal reflux disease)   . Glaucoma    bilateral eyes  . Gout   . Hereditary and idiopathic peripheral neuropathy 08/28/2015  . Hypertension   . Hypothyroidism   . Hypoxia   . Insomnia   . Memory disorder 08/28/2015  . On home oxygen therapy    at night with cpap  . Osteoarthritis   . Osteoporosis   . Peripheral neuropathy   . Rotator  cuff tear, left   . Sleep apnea    uses cpap-add oxygen at night  . Spinal compression fracture (HCC) seventh vertebre  . Transient alteration of awareness 08/28/2015  . Type 2 diabetes mellitus ([place]) 03/08/2016  . Wears glasses     Past Surgical History:  Procedure Laterality Date  . APPENDECTOMY  age 77  . CARPAL TUNNEL RELEASE Right    early 2000s  . CATARACT EXTRACTION     bilateral  . CHOLECYSTECTOMY  age 43  . EYE SURGERY    . FOOT ARTHRODESIS Right 02/02/2013   Procedure: RIGHT HALLUX METATARSAL PHALANGEAL JOINT ARTHRODESIS ;  Surgeon: Wylene Simmer, MD;  Location: [facility];  Service: Orthopedics;  Laterality: Right;  . INGUINAL HERNIA REPAIR  age 87   rt side  . NASAL CONCHA BULLOSA RESECTION  age 60  . PROSTATE SURGERY    . SHOULDER ARTHROSCOPY W/ ROTATOR CUFF REPAIR Right    early 2000s  . tonsil    . VASECTOMY  age 33    Social History   Socioeconomic History  . Marital status: Married    Spouse name: Not on file  . Number of children: 2  . Years of education: Not on file  . Highest education level: Not on file  Occupational History  . Occupation: RETIRED    Employer: RETIRED  Social Needs  . Financial resource strain: Somewhat hard  . Food insecurity    Worry: Never true    Inability: Never true  . Transportation needs    Medical: No    Non-medical: No  Tobacco Use  . Smoking status: Former Smoker    Packs/day: 2.00    Years: 10.00    Pack years: 20.00    Types: Cigarettes    Quit date: 07/18/1975  Years since quitting: 43.5  . Smokeless tobacco: Never Used  Substance and Sexual Activity  . Alcohol use: Yes    Comment: 1-2 drinks/day  . Drug use: No  . Sexual activity: Not Currently  Lifestyle  . Physical activity    Days per week: 0 days    Minutes per session: 0 min  . Stress: Not at all  Relationships  . Social connections    Talks on phone: More than three times a week    Gets together: More than three times a week     Attends religious service: Never    Active member of club or organization: Yes    Attends meetings of clubs or organizations: More than 4 times per year    Relationship status: Married  Other Topics Concern  . Not on file  Social History Narrative  . Not on file    Family History  Problem Relation Age of Onset  . Coronary artery disease Brother   . Heart disease Father   . Lung cancer Father   . Kidney cancer Father   . Prostate cancer Father   . Arthritis Mother   . Lung cancer Mother     Review of Systems  Constitutional: Negative for chills and fever.  Respiratory: Negative for cough, shortness of breath and wheezing.   Cardiovascular: Positive for leg swelling (mild at night). Negative for chest pain and palpitations.  Gastrointestinal: Negative for abdominal pain.  Neurological: Negative for light-headedness and headaches.  Psychiatric/Behavioral: Positive for dysphoric mood and sleep disturbance. The patient is not nervous/anxious.        Objective:   Vitals:   02/09/19 0954  BP: 138/78  Pulse: (!) 54  Temp: 98.1 F (36.7 C)  SpO2: 97%   BP Readings from Last 3 Encounters:  02/09/19 138/78  01/10/19 133/86  09/16/18 (!) 146/78   Wt Readings from Last 3 Encounters:  02/09/19 207 lb 1.9 oz (93.9 kg)  01/10/19 205 lb (93 kg)  09/16/18 205 lb (93 kg)   Body mass index is 32.44 kg/m.   Physical Exam    Constitutional: Appears well-developed and well-nourished. No distress.  HENT:  Head: Normocephalic and atraumatic.  Neck: Neck supple. No tracheal deviation present. No thyromegaly present.  No cervical lymphadenopathy Cardiovascular: Normal rate, regular rhythm and normal heart sounds.   No murmur heard. No carotid bruit .  No edema Pulmonary/Chest: Effort normal and breath sounds normal. No respiratory distress. No has no wheezes. No rales.  Skin: Skin is warm and dry. Not diaphoretic.  Psychiatric: Normal mood and affect. Behavior is normal.       Assessment & Plan:    See Problem List for Assessment and Plan of chronic medical problems.

## 2019-02-08 NOTE — Patient Instructions (Addendum)
  Tests ordered today. Your results will be released to [facility] (or called to you) after review.  If any changes need to be made, you will be notified at that same time.  Flu immunization administered today.    Medications reviewed and updated.  Changes include :   Change metformin to the extended release at dinner.    Your prescription(s) have been submitted to your pharmacy. Please take as directed and contact our office if you believe you are having problem(s) with the medication(s).  A referral was ordered for [facility]  Please followup in 6 months

## 2019-02-09 ENCOUNTER — Encounter: Payer: Self-pay | Admitting: Internal Medicine

## 2019-02-09 ENCOUNTER — Ambulatory Visit (INDEPENDENT_AMBULATORY_CARE_PROVIDER_SITE_OTHER): Payer: Medicare Other | Admitting: Internal Medicine

## 2019-02-09 ENCOUNTER — Other Ambulatory Visit: Payer: Self-pay

## 2019-02-09 ENCOUNTER — Other Ambulatory Visit (INDEPENDENT_AMBULATORY_CARE_PROVIDER_SITE_OTHER): Payer: Medicare Other

## 2019-02-09 VITALS — BP 138/78 | HR 54 | Temp 98.1°F | Ht 67.0 in | Wt 207.1 lb

## 2019-02-09 DIAGNOSIS — I480 Paroxysmal atrial fibrillation: Secondary | ICD-10-CM | POA: Diagnosis not present

## 2019-02-09 DIAGNOSIS — R413 Other amnesia: Secondary | ICD-10-CM | POA: Diagnosis not present

## 2019-02-09 DIAGNOSIS — E039 Hypothyroidism, unspecified: Secondary | ICD-10-CM

## 2019-02-09 DIAGNOSIS — M109 Gout, unspecified: Secondary | ICD-10-CM

## 2019-02-09 DIAGNOSIS — E119 Type 2 diabetes mellitus without complications: Secondary | ICD-10-CM

## 2019-02-09 DIAGNOSIS — K219 Gastro-esophageal reflux disease without esophagitis: Secondary | ICD-10-CM | POA: Diagnosis not present

## 2019-02-09 DIAGNOSIS — I10 Essential (primary) hypertension: Secondary | ICD-10-CM

## 2019-02-09 DIAGNOSIS — G4700 Insomnia, unspecified: Secondary | ICD-10-CM

## 2019-02-09 DIAGNOSIS — F3289 Other specified depressive episodes: Secondary | ICD-10-CM

## 2019-02-09 DIAGNOSIS — E785 Hyperlipidemia, unspecified: Secondary | ICD-10-CM

## 2019-02-09 LAB — CBC WITH DIFFERENTIAL/PLATELET
Basophils Absolute: 0 10*3/uL (ref 0.0–0.1)
Basophils Relative: 0.7 % (ref 0.0–3.0)
Eosinophils Absolute: 0 10*3/uL (ref 0.0–0.7)
Eosinophils Relative: 0.4 % (ref 0.0–5.0)
HCT: 39.6 % (ref 39.0–52.0)
Hemoglobin: 13.2 g/dL (ref 13.0–17.0)
Lymphocytes Relative: 27.5 % (ref 12.0–46.0)
Lymphs Abs: 1.7 10*3/uL (ref 0.7–4.0)
MCHC: 33.4 g/dL (ref 30.0–36.0)
MCV: 91.4 fl (ref 78.0–100.0)
Monocytes Absolute: 1.3 10*3/uL — ABNORMAL HIGH (ref 0.1–1.0)
Monocytes Relative: 22.1 % — ABNORMAL HIGH (ref 3.0–12.0)
Neutro Abs: 3.1 10*3/uL (ref 1.4–7.7)
Neutrophils Relative %: 50.5 % (ref 43.0–77.0)
Platelets: 129 10*3/uL — ABNORMAL LOW (ref 150.0–400.0)
RBC: 4.34 Mil/uL (ref 4.22–5.81)
RDW: 14.1 % (ref 11.5–15.5)
WBC: 6.1 10*3/uL (ref 4.0–10.5)

## 2019-02-09 LAB — COMPREHENSIVE METABOLIC PANEL
ALT: 17 U/L (ref 0–53)
AST: 16 U/L (ref 0–37)
Albumin: 4.5 g/dL (ref 3.5–5.2)
Alkaline Phosphatase: 78 U/L (ref 39–117)
BUN: 24 mg/dL — ABNORMAL HIGH (ref 6–23)
CO2: 28 mEq/L (ref 19–32)
Calcium: 9.9 mg/dL (ref 8.4–10.5)
Chloride: 98 mEq/L (ref 96–112)
Creatinine, Ser: 1.08 mg/dL (ref 0.40–1.50)
GFR: 65.19 mL/min (ref >60.00)
Glucose, Bld: 253 mg/dL — ABNORMAL HIGH (ref 70–99)
Potassium: 3.4 mEq/L — ABNORMAL LOW (ref 3.5–5.1)
Sodium: 138 mEq/L (ref 135–145)
Total Bilirubin: 0.5 mg/dL (ref 0.2–1.2)
Total Protein: 6.7 g/dL (ref 6.0–8.3)

## 2019-02-09 LAB — LIPID PANEL
Cholesterol: 148 mg/dL (ref 0–200)
HDL: 42.6 mg/dL (ref >39.00)
NonHDL: 105.81
Total CHOL/HDL Ratio: 3
Triglycerides: 267 mg/dL — ABNORMAL HIGH (ref 0.0–149.0)
VLDL: 53.4 mg/dL — ABNORMAL HIGH (ref 0.0–40.0)

## 2019-02-09 LAB — TSH: TSH: 1.99 u[IU]/mL (ref 0.35–4.50)

## 2019-02-09 LAB — LDL CHOLESTEROL, DIRECT: Direct LDL: 81 mg/dL

## 2019-02-09 LAB — HEMOGLOBIN A1C: Hgb A1c MFr Bld: 7.4 % — ABNORMAL HIGH (ref 4.6–6.5)

## 2019-02-09 MED ORDER — ZALEPLON 10 MG PO CAPS: 10.0000 mg | ORAL_CAPSULE | Freq: Every day | ORAL | 0 refills | Status: DC

## 2019-02-09 MED ORDER — ACYCLOVIR 5 % EX OINT: 1.0000 "application " | TOPICAL_OINTMENT | CUTANEOUS | 5 refills | Status: DC

## 2019-02-09 MED ORDER — BLOOD GLUCOSE MONITOR KIT: PACK | 0 refills | Status: DC

## 2019-02-09 MED ORDER — METFORMIN HCL ER 750 MG PO TB24: 750.0000 mg | ORAL_TABLET | Freq: Every day | ORAL | 1 refills | Status: DC

## 2019-02-09 NOTE — Assessment & Plan Note (Addendum)
Has gotten worse Will refer back to [facility] neurology

## 2019-02-09 NOTE — Assessment & Plan Note (Signed)
Clinically euthyroid Check tsh  Titrate med dose if needed  

## 2019-02-09 NOTE — Assessment & Plan Note (Signed)
Has chronic insomnia since he was a teenager Currently taking Ambien alternating with Sonata-he does not take both at the same time 1 more work for very short period of time only We will continue alternating medications.  He would like to have both on hand to take them alternating as needed, but I am concerned about the possibility of an accidental overdose given his memory issues I will only prescribe one at a time-we will limit prescriptions to 30 pills at a time

## 2019-02-09 NOTE — Assessment & Plan Note (Signed)
Taking allopurinol Denies gout symptoms Continue

## 2019-02-09 NOTE — Assessment & Plan Note (Signed)
Blood pressure controlled Continue current medication at current doses CMP

## 2019-02-09 NOTE — Assessment & Plan Note (Signed)
Only taking metformin once a day, not twice a day-we will change to the extended release 750 mg daily with supper Increase activity possible Diabetic diet A1c

## 2019-02-09 NOTE — Assessment & Plan Note (Signed)
He states he does feel depressed-mostly regarding his pain Currently taking Cymbalta-we will continue

## 2019-02-09 NOTE — Assessment & Plan Note (Signed)
GERD controlled Continue daily medication  

## 2019-02-09 NOTE — Assessment & Plan Note (Signed)
Asymptomatic Taking Eliquis, diltiazem Following with cardiology CMP, CBC

## 2019-02-09 NOTE — Assessment & Plan Note (Signed)
Check lipid panel  Continue daily statin Regular exercise and healthy diet encouraged  

## 2019-02-13 DIAGNOSIS — M546 Pain in thoracic spine: Secondary | ICD-10-CM | POA: Diagnosis not present

## 2019-02-15 ENCOUNTER — Ambulatory Visit (INDEPENDENT_AMBULATORY_CARE_PROVIDER_SITE_OTHER): Payer: Medicare Other | Admitting: Psychology

## 2019-02-15 ENCOUNTER — Other Ambulatory Visit: Payer: Self-pay

## 2019-02-15 ENCOUNTER — Ambulatory Visit: Payer: Medicare Other | Admitting: Psychology

## 2019-02-15 ENCOUNTER — Encounter: Payer: Self-pay | Admitting: Psychology

## 2019-02-15 DIAGNOSIS — F411 Generalized anxiety disorder: Secondary | ICD-10-CM | POA: Diagnosis not present

## 2019-02-15 DIAGNOSIS — M797 Fibromyalgia: Secondary | ICD-10-CM

## 2019-02-15 DIAGNOSIS — F330 Major depressive disorder, recurrent, mild: Secondary | ICD-10-CM | POA: Diagnosis not present

## 2019-02-15 DIAGNOSIS — G4733 Obstructive sleep apnea (adult) (pediatric): Secondary | ICD-10-CM

## 2019-02-15 MED ORDER — POTASSIUM CHLORIDE CRYS ER 20 MEQ PO TBCR: 20.0000 meq | EXTENDED_RELEASE_TABLET | Freq: Every day | ORAL | 3 refills | Status: DC

## 2019-02-15 NOTE — Progress Notes (Signed)
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 Delayed Recall --- 69 97 Well Above Average  Spatial Domain --- 120 91 Well Above Average    Visual Discrimination --- 61 86 Above Average    Design Construction --- 58 79 Above Average  Executive Functions Domain --- 90 25 Average    Mazes --- 55 69 Average    Word Generation --- 34 5 Well Below Average        Estimated Intellectual Functioning:             Standard Score Standard Score Percentile   Test of Premorbid Functioning (TOPF): 106 109 73 Average        Memory:            D[employer] Learning Test (CVLT-III) Brief Form: Raw Score (Z-Score) Raw Score (Scaled/ Standard Score) Percentile     Total Trials 1-4 25/36 (61 T) 17/36 (77) 6 Well Below Average    Short-Delay Free Recall 7/9 (1.5) 5/9 (7) 7 Below Average    Long-Delay Free Recall 6/9 (1) 4/9 (7) 16 Below Average    Long-Delay Cued Recall 7/9 (1.5) 4/9 (4) 2 Well Below Average      Recognition Hits 8/9 (---) 9/9 (13) 84 Above Average      False Positive  Errors 0 (---) 3 (6) 9 Below Average        Attention/Executive Function:            Trail Making Test (TMT): Raw Score Raw Score (T Score) Percentile     Part A 27 secs.  0 errors 35 secs.,  0 errors (49) 46 Average    Part B 92 secs.  0 errors 116 secs.,  1 error (45) 31 Average         Scaled Score Scaled Score Percentile   WAIS-IV Similarities: 14 15 95 Well Above Average        D-KEFS Color-Word Interference Test: Scaled Score Raw Score (Scaled Score) Percentile     Color Naming --- 40 secs. (8) 25 Average    Word Reading --- 38 secs. (4)  2 Well Below Average    Inhibition --- 129 secs. (6) 9 Below Average      Total Errors --- 2 errors (11) 63 Average    Inhibition/Switching --- 170 secs. (2) <1 Exceptionally Low      Total Errors --- 8 errors (6) 9 Below Average        D-KEFS 20 Questions Test: Scaled Score Scaled Score Percentile     Total Weighted Achievement Score --- 14 91 Above Average    Initial Abstraction Score --- 12 75 Above Average        Language:            Verbal Fluency Test: Raw Score Raw Score (Z-Score) Percentile     Phonemic Fluency (FAS) 23 20 (-1.52) 7 Well Below Average    Animal Fluency 13 13 (-0.77) 23 Below Average  *Based on [employer] Older Normative Studies (MOANS)            NAB Language Module, Form 1: T Score T Score Percentile     Naming 64 63 91 Well Above Average        Visuospatial/Visuoconstruction:       Raw Score Raw Score Percentile   Clock Drawing: WNL 8/10 --- Within Normal Limits         Scaled Score Scaled Score Percentile   WAIS-IV Matrix Reasoning: --- 12 75 Above Average        Mood and Personality:       Raw Score Raw Score Percentile   Geriatric Depression Scale: --- 16 --- Mild  Geriatric Anxiety Scale: --- 24 --- Moderate    Somatic --- 10 --- Moderate    Cognitive --- 6 --- Mild    Affective --- 8 --- Moderate        Additional Questionnaires:       Raw Score Raw Score Percentile   PROMIS Sleep Disturbance  Questionnaire: --- 21 --- None to Slight   Informed Consent and Coding/Compliance:   Mr. Dykstra was provided with a verbal description of the nature and purpose of the present neuropsychological evaluation. Also reviewed were the foreseeable risks and/or discomforts and benefits of the procedure, limits of confidentiality, and mandatory reporting requirements of this provider. The patient was given the opportunity to ask questions and receive answers about the evaluation. Oral consent to participate was provided by the patient.   This evaluation was conducted by Christia Reading, Ph.D., licensed clinical neuropsychologist. Mr. Mccardle completed a 30-minute clinical interview, billed as one unit [phone], and 135 minutes of cognitive testing, billed as one unit [phone] and four additional units [phone]. Psychometrist Milana Kidney, B.S. assisted Dr. Melvyn Novas with test administration and scoring procedures. As a separate and discrete service, Dr. Melvyn Novas spent a total of 180 minutes in interpretation and report writing, billed as one unit 96132 and two units [id number].

## 2019-02-15 NOTE — Progress Notes (Signed)
   Neuropsychology Note   Jonathan [name] completed 120 minutes of neuropsychological testing with technician, Jonathan [name], B.S., under the supervision of Dr. Christia Reading, Ph.D., licensed neuropsychologist. The patient did not appear overtly distressed by the testing session, per behavioral observation or via self-report to the technician. Rest breaks were offered.    In considering the patient's current level of functioning, level of presumed impairment, nature of symptoms, emotional and behavioral responses during the interview, level of literacy, and observed level of motivation/effort, a battery of tests was selected and communicated to the psychometrician.   Communication between the psychologist and technician was ongoing throughout the testing session and changes were made as deemed necessary based on patient performance on testing, technician observations and additional pertinent factors such as those listed above.   Jonathan [name] will return within approximately two weeks for an interactive feedback session with Dr. Melvyn Novas at which time his test performances, clinical impressions, and treatment recommendations will be reviewed in detail. The patient understands he can contact our office should he require our assistance before this time.   Full report to follow.  120 minutes were spent face-to-face with patient administering standardized tests and 15 minutes were spent scoring (technician). [CPT [id number], [id number]

## 2019-02-16 ENCOUNTER — Encounter: Payer: Self-pay | Admitting: Psychology

## 2019-02-17 DIAGNOSIS — M546 Pain in thoracic spine: Secondary | ICD-10-CM | POA: Diagnosis not present

## 2019-02-21 DIAGNOSIS — M5136 Other intervertebral disc degeneration, lumbar region: Secondary | ICD-10-CM | POA: Diagnosis not present

## 2019-02-23 ENCOUNTER — Encounter: Payer: Self-pay | Admitting: Psychology

## 2019-02-23 ENCOUNTER — Encounter: Payer: Self-pay | Admitting: Internal Medicine

## 2019-02-23 ENCOUNTER — Other Ambulatory Visit: Payer: Self-pay

## 2019-02-23 ENCOUNTER — Ambulatory Visit (INDEPENDENT_AMBULATORY_CARE_PROVIDER_SITE_OTHER): Payer: Medicare Other | Admitting: Psychology

## 2019-02-23 DIAGNOSIS — F411 Generalized anxiety disorder: Secondary | ICD-10-CM

## 2019-02-23 DIAGNOSIS — M19042 Primary osteoarthritis, left hand: Secondary | ICD-10-CM | POA: Diagnosis not present

## 2019-02-23 DIAGNOSIS — F330 Major depressive disorder, recurrent, mild: Secondary | ICD-10-CM

## 2019-02-23 DIAGNOSIS — M19041 Primary osteoarthritis, right hand: Secondary | ICD-10-CM | POA: Diagnosis not present

## 2019-02-23 DIAGNOSIS — M720 Palmar fascial fibromatosis [Dupuytren]: Secondary | ICD-10-CM | POA: Diagnosis not present

## 2019-02-23 DIAGNOSIS — M79641 Pain in right hand: Secondary | ICD-10-CM | POA: Diagnosis not present

## 2019-02-23 DIAGNOSIS — M79642 Pain in left hand: Secondary | ICD-10-CM | POA: Diagnosis not present

## 2019-02-23 NOTE — Progress Notes (Signed)
   NEUROPSYCHOLOGICAL EVALUATION - [facility] [place] [place] Department of Neurology  Reason for Referral:   Jonathan [name] a 85 y.o. Caucasian male referred by Billey Gosling, M.D.,to characterize hiscurrent cognitive functioning and assist with diagnostic clarity and treatment planning in the context of subjective cognitive decline and several medical and psychiatric comorbidities.  Feedback:   [name] completed a comprehensive neuropsychological evaluation on 02/15/2019. Briefly, results suggested neuropsychological functioning largely within normal limits. However, notable performance variability was exhibited across domains of processing speed, attention/concentration, verbal fluency, and executive functioning. Relative to his previous neuropsychological evaluation in May 2018, performance decline was exhibited across domains of processing speed, complex attention, and executive functioning. Likewise, performance on a list learning verbal memory task also declined; however, other memory performances were stable and well within normal limits. Recommendations included a repeat neuropsychological evaluation in 12-18 months given some evidence of cognitive decline, as well as day-to-day strategies to improve attention/concentration.  [name] was accompanied by his wife. Content of the current session focused on possible reasons for cognitive decline across select cognitive domains, including his somewhat recent history of experiencing a subdural hematoma, as well as ongoing traits of ADHD and mood-related concerns. Mr. Raw and his wife were given the opportunity to ask questions and all their questions were answered. They were encouraged to reach out should additional questions arise. A copy of his report was provided at the conclusion of the visit.      A total of 25 minutes were spent with [name] during the current feedback session.

## 2019-02-24 DIAGNOSIS — M546 Pain in thoracic spine: Secondary | ICD-10-CM | POA: Diagnosis not present

## 2019-02-28 DIAGNOSIS — M546 Pain in thoracic spine: Secondary | ICD-10-CM | POA: Diagnosis not present

## 2019-03-03 DIAGNOSIS — M546 Pain in thoracic spine: Secondary | ICD-10-CM | POA: Diagnosis not present

## 2019-03-15 ENCOUNTER — Encounter: Payer: Self-pay | Admitting: Internal Medicine

## 2019-03-15 MED ORDER — DOXAZOSIN MESYLATE 8 MG PO TABS: ORAL_TABLET | ORAL | 1 refills | Status: DC

## 2019-03-16 ENCOUNTER — Other Ambulatory Visit: Payer: Self-pay | Admitting: Internal Medicine

## 2019-03-17 ENCOUNTER — Other Ambulatory Visit: Payer: Self-pay | Admitting: Internal Medicine

## 2019-04-03 ENCOUNTER — Other Ambulatory Visit: Payer: Self-pay | Admitting: Cardiology

## 2019-04-03 DIAGNOSIS — I480 Paroxysmal atrial fibrillation: Secondary | ICD-10-CM

## 2019-04-12 ENCOUNTER — Other Ambulatory Visit: Payer: Self-pay | Admitting: Internal Medicine

## 2019-04-12 NOTE — Telephone Encounter (Signed)
[facility] Controlled Database Checked Last filled: 12/16/18 # 30 LOV w/you: 02/09/19 Next appt w/you: 08/14/19

## 2019-04-13 ENCOUNTER — Other Ambulatory Visit: Payer: Self-pay | Admitting: Internal Medicine

## 2019-04-26 ENCOUNTER — Other Ambulatory Visit: Payer: Self-pay

## 2019-04-26 ENCOUNTER — Ambulatory Visit (INDEPENDENT_AMBULATORY_CARE_PROVIDER_SITE_OTHER): Payer: Medicare Other | Admitting: *Deleted

## 2019-04-26 VITALS — BP 142/88 | HR 71 | Resp 17 | Ht 67.0 in | Wt 203.0 lb

## 2019-04-26 DIAGNOSIS — Z0000 Encounter for general adult medical examination without abnormal findings: Secondary | ICD-10-CM | POA: Diagnosis not present

## 2019-04-26 DIAGNOSIS — E119 Type 2 diabetes mellitus without complications: Secondary | ICD-10-CM

## 2019-04-26 DIAGNOSIS — Z789 Other specified health status: Secondary | ICD-10-CM | POA: Diagnosis not present

## 2019-04-26 NOTE — Progress Notes (Addendum)
Subjective:   Jonathan [name] is a 85 y.o. male who presents for Medicare Annual/Subsequent preventive examination.  Review of Systems:   Cardiac Risk Factors include: advanced age (>1mn, >>37women);diabetes mellitus;dyslipidemia;hypertension;male gender;obesity (BMI >30kg/m2) Sleep patterns: has interrupted sleep, gets up 1-2 times nightly to void and sleeps hours vary nightly.  Patient reports insomnia issues, discussed recommended sleep tips and stress reduction tips, education was attached to patient's AVS.  Home Safety/Smoke Alarms: Feels safe in home. Smoke alarms in place.  Living environment; residence and Firearm Safety: 1-story house/ trailer. Lives with wife, no needs for DME, good support system Seat Belt Safety/Bike Helmet: Wears seat belt.      Objective:    Vitals: BP (!) 142/88   Pulse 71   Resp 17   Ht 5' 7"  (1.702 m)   Wt 203 lb (92.1 kg)   SpO2 98%   BMI 31.79 kg/m   Body mass index is 31.79 kg/m.  Advanced Directives 04/26/2019 04/20/2018 03/07/2018 04/19/2017 03/07/2016 08/28/2015 01/26/2013  Does Patient Have a Medical Advance Directive? Yes Yes Yes Yes Yes Yes Patient has advance directive, copy not in chart  Type of Advance Directive [place] will [place] will Living will [place] will - - [place] will  Does patient want to make changes to medical advance directive? - - No - Patient declined - - - -  Copy of [place] Chart? No - copy requested No - copy requested - No - copy requested No - copy requested No - copy requested -  Would patient like information on creating a medical advance directive? - - No - Patient declined - - - -  Pre-existing out of facility DNR order (yellow form or pink MOST form) - - - - - - -    Tobacco Social History   Tobacco Use  Smoking Status Former Smoker  . Packs/day: 2.00  . Years: 10.00  .  Pack years: 20.00  . Types: Cigarettes  . Quit date: 07/18/1975  . Years since quitting: 43.8  Smokeless Tobacco Never Used     Counseling given: Not Answered  Past Medical History:  Diagnosis Date  . Atrial fibrillation (HCC)    a. paroxysmal, on Eliquis for anticoagulation  . BPH (benign prostatic hyperplasia)   . Bronchitis   . Cataract   . COPD (chronic obstructive pulmonary disease) (HCC)    2 liters O2 HS  . Depression   . Fatty tumor    waste and back  . Fibromyalgia   . GERD (gastroesophageal reflux disease)   . Glaucoma    bilateral eyes  . Gout   . Hereditary and idiopathic peripheral neuropathy 08/28/2015  . Hypertension   . Hypothyroidism   . Hypoxia   . Insomnia   . Osteoarthritis   . Osteoporosis   . Peripheral neuropathy   . Sleep apnea    uses cpap-add oxygen at night  . Spinal compression fracture (HCC) seventh vertebre  . Transient alteration of awareness 08/28/2015  . Type 2 diabetes mellitus ([place] 03/08/2016   Past Surgical History:  Procedure Laterality Date  . APPENDECTOMY  age 89 . CARPAL TUNNEL RELEASE Right    early 2000s  . CATARACT EXTRACTION     bilateral  . CHOLECYSTECTOMY  age 85 . EYE SURGERY    . FOOT ARTHRODESIS Right 02/02/2013   Procedure: RIGHT HALLUX METATARSAL PHALANGEAL JOINT ARTHRODESIS ;  Surgeon:  Wylene Simmer, MD;  Location: [facility];  Service: Orthopedics;  Laterality: Right;  . INGUINAL HERNIA REPAIR  age 51   rt side  . NASAL CONCHA BULLOSA RESECTION  age 34  . PROSTATE SURGERY    . SHOULDER ARTHROSCOPY W/ ROTATOR CUFF REPAIR Right    early 2000s  . tonsil    . VASECTOMY  age 50   Family History  Problem Relation Age of Onset  . Coronary artery disease Brother   . Heart disease Father   . Lung cancer Father   . Kidney cancer Father   . Prostate cancer Father   . Arthritis Mother   . Lung cancer Mother   . Dementia Mother        Unspecified type, not Alzheimer's disease   Social History    Socioeconomic History  . Marital status: Married    Spouse name: Not on file  . Number of children: 2  . Years of education: 19  . Highest education level: Master's degree (e.g., MA, MS, MEng, MEd, MSW, MBA)  Occupational History  . Occupation: RETIRED    Employer: RETIRED  Social Needs  . Financial resource strain: Somewhat hard  . Food insecurity    Worry: Never true    Inability: Never true  . Transportation needs    Medical: No    Non-medical: No  Tobacco Use  . Smoking status: Former Smoker    Packs/day: 2.00    Years: 10.00    Pack years: 20.00    Types: Cigarettes    Quit date: 07/18/1975    Years since quitting: 43.8  . Smokeless tobacco: Never Used  Substance and Sexual Activity  . Alcohol use: Yes    Comment: 1-2 drinks/day  . Drug use: No  . Sexual activity: Not Currently  Lifestyle  . Physical activity    Days per week: 0 days    Minutes per session: 0 min  . Stress: Not at all  Relationships  . Social connections    Talks on phone: More than three times a week    Gets together: More than three times a week    Attends religious service: Never    Active member of club or organization: Yes    Attends meetings of clubs or organizations: More than 4 times per year    Relationship status: Married  Other Topics Concern  . Not on file  Social History Narrative  . Not on file    Outpatient Encounter Medications as of 04/26/2019  Medication Sig  . acetaminophen (TYLENOL) 500 MG tablet Take 2 tablets (1,000 mg total) by mouth every 8 (eight) hours as needed. (Patient taking differently: Take 1,000 mg by mouth 2 (two) times daily. )  . acyclovir ointment (ZOVIRAX) 5 % Apply 1 application topically every 3 (three) hours.  Marland Kitchen allopurinol (ZYLOPRIM) 300 MG tablet TAKE 1 TABLET BY MOUTH  EVERY EVENING  . apixaban (ELIQUIS) 5 MG TABS tablet Take 1 tablet (5 mg total) by mouth 2 (two) times daily.  Marland Kitchen atorvastatin (LIPITOR) 20 MG tablet Take 1 tablet (20 mg total) by  mouth daily at 6 PM.  . Biotin 5000 MCG TABS Take 5,000 mcg by mouth daily.  . blood glucose meter kit and supplies KIT Dispense based on patient and insurance preference. Use daily as directed. (FOR E11.9).  . brimonidine (ALPHAGAN) 0.15 % ophthalmic solution Place 1 drop into the left eye 2 (two) times daily.   . calcium-vitamin D (OSCAL WITH  D) 500-200 MG-UNIT tablet Take 1 tablet by mouth daily with breakfast.  . colchicine (COLCRYS) 0.6 MG tablet Take 0.6 mg by mouth daily as needed (flareups).   . diltiazem (CARDIZEM CD) 300 MG 24 hr capsule TAKE 1 CAPSULE BY MOUTH  DAILY  . doxazosin (CARDURA) 8 MG tablet TAKE 1 TABLET BY MOUTH  DAILY.  . DULoxetine (CYMBALTA) 30 MG capsule TAKE 2 CAPSULES BY MOUTH IN THE MORNING AND 1 CAPSULE  IN THE EVENING  . glucose blood test strip Use to check blood sugar daily. E11.9  . hydrochlorothiazide (HYDRODIURIL) 25 MG tablet TAKE 1 TABLET BY MOUTH  DAILY  . Lancets MISC Use to check blood sugars daily. E11.9  . latanoprost (XALATAN) 0.005 % ophthalmic solution Place 1 drop into both eyes at bedtime.  . levalbuterol (XOPENEX HFA) 45 MCG/ACT inhaler Inhale 1-2 puffs into the lungs every 4 (four) hours as needed for wheezing.  Marland Kitchen levothyroxine (SYNTHROID) 112 MCG tablet TAKE 1 TABLET BY MOUTH EVERY DAY  . magnesium oxide (MAG-OX) 400 MG tablet Take 400 mg by mouth daily.  . metFORMIN (GLUCOPHAGE XR) 750 MG 24 hr tablet Take 1 tablet (750 mg total) by mouth daily with supper.  . mometasone (NASONEX) 50 MCG/ACT nasal spray Place 2 sprays into the nose daily.  . Multiple Vitamin (MULTIVITAMIN) tablet Take 1 tablet by mouth daily.  . nitroGLYCERIN (NITROSTAT) 0.4 MG SL tablet Place 1 tablet (0.4 mg total) under the tongue every 5 (five) minutes as needed for chest pain.  Marland Kitchen omeprazole (PRILOSEC) 20 MG capsule TAKE 1 CAPSULE BY MOUTH  DAILY  . oxyCODONE-acetaminophen (PERCOCET) 10-325 MG tablet Take 1 tablet by mouth every 8 (eight) hours as needed for pain.  .  potassium chloride SA (K-DUR) 20 MEQ tablet Take 1 tablet (20 mEq total) by mouth daily.  . solifenacin (VESICARE) 5 MG tablet Take 5 mg by mouth daily.  . tadalafil (CIALIS) 20 MG tablet Take 1 tablet (20 mg total) by mouth daily as needed. For erectile dysfunction  . tolterodine (DETROL LA) 4 MG 24 hr capsule Take 4 mg by mouth daily.  . vitamin C (ASCORBIC ACID) 500 MG tablet Take 1,000 mg by mouth daily.   Marland Kitchen VITAMIN D, CHOLECALCIFEROL, PO Take 50 mcg by mouth daily.  . zaleplon (SONATA) 10 MG capsule Take 1 capsule (10 mg total) by mouth at bedtime.  Marland Kitchen zolpidem (AMBIEN) 10 MG tablet TAKE 1 TABLET (10 MG TOTAL) BY MOUTH AT BEDTIME AS NEEDED FOR SLEEP.  . [DISCONTINUED] potassium gluconate (EQL POTASSIUM GLUCONATE) 595 (99 K) MG TABS tablet Take 595 mg by mouth daily.   No facility-administered encounter medications on file as of 04/26/2019.     Activities of Daily Living In your present state of health, do you have any difficulty performing the following activities: 04/26/2019  Hearing? N  Vision? N  Difficulty concentrating or making decisions? N  Walking or climbing stairs? N  Dressing or bathing? N  Doing errands, shopping? N  Preparing Food and eating ? N  Using the Toilet? N  In the past six months, have you accidently leaked urine? N  Do you have problems with loss of bowel control? N  Managing your Medications? N  Managing your Finances? N  Housekeeping or managing your Housekeeping? N  Some recent data might be hidden    Patient Care Team: Binnie Rail, MD as PCP - General (Internal Medicine) Stanford Breed Denice Bors, MD as PCP - Cardiology (Cardiology) Council Mechanic,  MD as Referring Physician (Family Medicine) Earnie Larsson, MD as Consulting Physician (Neurosurgery) Stanford Breed, Denice Bors, MD as Consulting Physician (Cardiology) Michael Boston, MD as Consulting Physician (General Surgery) Karen Kays, NP as Nurse Practitioner (Nurse Practitioner)   Assessment:   This  is a routine wellness examination for Saline. Physical assessment deferred to PCP.   Exercise Activities and Dietary recommendations Current Exercise Habits: The patient does not participate in regular exercise at present, Exercise limited by: None identified  Diet (meal preparation, eat out, water intake, caffeinated beverages, dairy products, fruits and vegetables): in general, a "healthy" diet  , well balanced   Reviewed heart healthy and diabetic diet. Encouraged patient to increase daily water and healthy fluid intake.  Goals    . Cut out extra servings     I am working to keep my blood sugar down by limiting keeping ice cream and sweets in the house.       Fall Risk Fall Risk  04/26/2019 04/26/2019 04/20/2018 04/19/2017 01/14/2017  Falls in the past year? 0 0 1 No No  Number falls in past yr: 0 0 0 - -  Injury with Fall? 0 0 1 - -  Risk for fall due to : Impaired balance/gait - Impaired balance/gait - -  Follow up - - Falls prevention discussed - -   Is the patient's home free of loose throw rugs in walkways, pet beds, electrical cords, etc?   yes      Grab bars in the bathroom? yes      Handrails on the stairs?   yes      Adequate lighting?   yes  Depression Screen PHQ 2/9 Scores 04/26/2019 04/20/2018 04/19/2017 01/14/2017  PHQ - 2 Score 2 4 2  0  PHQ- 9 Score 7 9 8  -    Cognitive Function MMSE - Mini Mental State Exam 04/26/2019 04/20/2018 04/19/2017 08/28/2015  Not completed: Refused - Refused -  Orientation to time - 5 - 5  Orientation to Place - 5 - 5  Registration - 3 - 3  Attention/ Calculation - 5 - 5  Recall - 2 - 2  Language- name 2 objects - 2 - 2  Language- repeat - 1 - 1  Language- follow 3 step command - 3 - 3  Language- read & follow direction - 1 - 1  Write a sentence - 1 - 1  Copy design - 1 - 1  Total score - 29 - 29       Ad8 score reviewed for issues:  Issues making decisions: no  Less interest in hobbies / activities: no  Repeats questions,  stories (family complaining): no  Trouble using ordinary gadgets (microwave, computer, phone):no  Forgets the month or year: no  Mismanaging finances: no  Remembering appts: no  Daily problems with thinking and/or memory: yes Ad8 score is= 1   Immunization History  Administered Date(s) Administered  . Hepatitis A 07/25/2007, 01/22/2008  . Hepatitis B 04/30/1988, 06/02/1988, 10/28/1988  . IPV 05/15/1996  . Influenza Split 02/19/2012, 03/13/2016  . Influenza Whole 02/17/2011, 03/13/2013  . Influenza, High Dose Seasonal PF 03/10/2017, 02/15/2018, 02/09/2019  . Influenza,inj,Quad PF,6+ Mos 03/15/2015  . Meningococcal Polysaccharide 05/15/1996  . Pneumococcal Conjugate-13 12/08/2016  . Pneumococcal-Unspecified 02/01/2006  . Td 01/14/1995  . Tdap 03/06/2018  . Zoster 02/01/2006  . Zoster Recombinat (Shingrix) 01/15/2017, 01/04/2018   Screening Tests Health Maintenance  Topic Date Due  . FOOT EXAM  04/21/2019  . DEXA SCAN  10/15/2019  .  OPHTHALMOLOGY EXAM  02/01/2020  . TETANUS/TDAP  03/06/2028  . INFLUENZA VACCINE  Completed  . PNA vac Low Risk Adult  Completed       Plan:    Reviewed health maintenance screenings with patient today and relevant education, vaccines, and/or referrals were provided.   I have personally reviewed and noted the following in the patient's chart:   . Medical and social history . Use of alcohol, tobacco or illicit drugs  . Current medications and supplements . Functional ability and status . Nutritional status . Physical activity . Advanced directives . List of other physicians . Vitals . Screenings to include cognitive, depression, and falls . Referrals and appointments  In addition, I have reviewed and discussed with patient certain preventive protocols, quality metrics, and best practice recommendations. A written personalized care plan for preventive services as well as general preventive health recommendations were provided to  patient.     Michiel Cowboy, RN  04/26/2019    Medical screening examination/treatment/procedure(s) were performed by non-physician practitioner and as supervising physician I was immediately available for consultation/collaboration. I agree with above. Binnie Rail, MD

## 2019-04-26 NOTE — Patient Instructions (Addendum)
Continue doing brain stimulating activities (puzzles, reading, adult coloring books, staying active) to keep memory sharp.   Continue to eat heart healthy diet (full of fruits, vegetables, whole grains, lean protein, water--limit salt, fat, and sugar intake) and increase physical activity as tolerated.   Jonathan [name] , Thank you for taking time to come for your Medicare Wellness Visit. I appreciate your ongoing commitment to your health goals. Please review the following plan we discussed and let me know if I can assist you in the future.   These are the goals we discussed: Goals    . Cut out extra servings     I am working to keep my blood sugar down by limiting keeping ice cream and sweets in the house.       This is a list of the screening recommended for you and due dates:  Health Maintenance  Topic Date Due  . Complete foot exam   04/21/2019  . DEXA scan (bone density measurement)  10/15/2019  . Eye exam for diabetics  02/01/2020  . Tetanus Vaccine  03/06/2028  . Flu Shot  Completed  . Pneumonia vaccines  Completed    Insomnia Insomnia is a sleep disorder that makes it difficult to fall asleep or stay asleep. Insomnia can cause fatigue, low energy, difficulty concentrating, mood swings, and poor performance at work or school. There are three different ways to classify insomnia:  Difficulty falling asleep.  Difficulty staying asleep.  Waking up too early in the morning. Any type of insomnia can be long-term (chronic) or short-term (acute). Both are common. Short-term insomnia usually lasts for three months or less. Chronic insomnia occurs at least three times a week for longer than three months. What are the causes? Insomnia may be caused by another condition, situation, or substance, such as:  Anxiety.  Certain medicines.  Gastroesophageal reflux disease (GERD) or other gastrointestinal conditions.  Asthma or other breathing conditions.  Restless legs syndrome, sleep  apnea, or other sleep disorders.  Chronic pain.  Menopause.  Stroke.  Abuse of alcohol, tobacco, or illegal drugs.  Mental health conditions, such as depression.  Caffeine.  Neurological disorders, such as Alzheimer's disease.  An overactive thyroid (hyperthyroidism). Sometimes, the cause of insomnia may not be known. What increases the risk? Risk factors for insomnia include:  Gender. Women are affected more often than men.  Age. Insomnia is more common as you get older.  Stress.  Lack of exercise.  Irregular work schedule or working night shifts.  Traveling between different time zones.  Certain medical and mental health conditions. What are the signs or symptoms? If you have insomnia, the main symptom is having trouble falling asleep or having trouble staying asleep. This may lead to other symptoms, such as:  Feeling fatigued or having low energy.  Feeling nervous about going to sleep.  Not feeling rested in the morning.  Having trouble concentrating.  Feeling irritable, anxious, or depressed. How is this diagnosed? This condition may be diagnosed based on:  Your symptoms and medical history. Your health care provider may ask about: ? Your sleep habits. ? Any medical conditions you have. ? Your mental health.  A physical exam. How is this treated? Treatment for insomnia depends on the cause. Treatment may focus on treating an underlying condition that is causing insomnia. Treatment may also include:  Medicines to help you sleep.  Counseling or therapy.  Lifestyle adjustments to help you sleep better. Follow these instructions at home: Eating and drinking  Limit or avoid alcohol, caffeinated beverages, and cigarettes, especially close to bedtime. These can disrupt your sleep.  Do not eat a large meal or eat spicy foods right before bedtime. This can lead to digestive discomfort that can make it hard for you to sleep. Sleep habits   Keep a  sleep diary to help you and your health care provider figure out what could be causing your insomnia. Write down: ? When you sleep. ? When you wake up during the night. ? How well you sleep. ? How rested you feel the next day. ? Any side effects of medicines you are taking. ? What you eat and drink.  Make your bedroom a dark, comfortable place where it is easy to fall asleep. ? Put up shades or blackout curtains to block light from outside. ? Use a white noise machine to block noise. ? Keep the temperature cool.  Limit screen use before bedtime. This includes: ? Watching TV. ? Using your smartphone, tablet, or computer.  Stick to a routine that includes going to bed and waking up at the same times every day and night. This can help you fall asleep faster. Consider making a quiet activity, such as reading, part of your nighttime routine.  Try to avoid taking naps during the day so that you sleep better at night.  Get out of bed if you are still awake after 15 minutes of trying to sleep. Keep the lights down, but try reading or doing a quiet activity. When you feel sleepy, go back to bed. General instructions  Take over-the-counter and prescription medicines only as told by your health care provider.  Exercise regularly, as told by your health care provider. Avoid exercise starting several hours before bedtime.  Use relaxation techniques to manage stress. Ask your health care provider to suggest some techniques that may work well for you. These may include: ? Breathing exercises. ? Routines to release muscle tension. ? Visualizing peaceful scenes.  Make sure that you drive carefully. Avoid driving if you feel very sleepy.  Keep all follow-up visits as told by your health care provider. This is important. Contact a health care provider if:  You are tired throughout the day.  You have trouble in your daily routine due to sleepiness.  You continue to have sleep problems, or your  sleep problems get worse. Get help right away if:  You have serious thoughts about hurting yourself or someone else. If you ever feel like you may hurt yourself or others, or have thoughts about taking your own life, get help right away. You can go to your nearest emergency department or call:  Your local emergency services (911 in the [place]  A suicide crisis helpline, such as the [place] at 985-710-9481. This is open 24 hours a day. Summary  Insomnia is a sleep disorder that makes it difficult to fall asleep or stay asleep.  Insomnia can be long-term (chronic) or short-term (acute).  Treatment for insomnia depends on the cause. Treatment may focus on treating an underlying condition that is causing insomnia.  Keep a sleep diary to help you and your health care provider figure out what could be causing your insomnia. This information is not intended to replace advice given to you by your health care provider. Make sure you discuss any questions you have with your health care provider. Document Released: 05/29/2000 Document Revised: 05/14/2017 Document Reviewed: 03/11/2017 Elsevier Patient Education  2020 [place]  Years and Older, Male Preventive care refers to lifestyle choices and visits with your health care provider that can promote health and wellness. This includes:  A yearly physical exam. This is also called an annual well check.  Regular dental and eye exams.  Immunizations.  Screening for certain conditions.  Healthy lifestyle choices, such as diet and exercise. What can I expect for my preventive care visit? Physical exam Your health care provider will check:  Height and weight. These may be used to calculate body mass index (BMI), which is a measurement that tells if you are at a healthy weight.  Heart rate and blood pressure.  Your skin for abnormal spots. Counseling Your health care provider may ask  you questions about:  Alcohol, tobacco, and drug use.  Emotional well-being.  Home and relationship well-being.  Sexual activity.  Eating habits.  History of falls.  Memory and ability to understand (cognition).  Work and work Statistician. What immunizations do I need?  Influenza (flu) vaccine  This is recommended every year. Tetanus, diphtheria, and pertussis (Tdap) vaccine  You may need a Td booster every 10 years. Varicella (chickenpox) vaccine  You may need this vaccine if you have not already been vaccinated. Zoster (shingles) vaccine  You may need this after age 39. Pneumococcal conjugate (PCV13) vaccine  One dose is recommended after age 56. Pneumococcal polysaccharide (PPSV23) vaccine  One dose is recommended after age 20. Measles, mumps, and rubella (MMR) vaccine  You may need at least one dose of MMR if you were born in 1957 or later. You may also need a second dose. Meningococcal conjugate (MenACWY) vaccine  You may need this if you have certain conditions. Hepatitis A vaccine  You may need this if you have certain conditions or if you travel or work in places where you may be exposed to hepatitis A. Hepatitis B vaccine  You may need this if you have certain conditions or if you travel or work in places where you may be exposed to hepatitis B. Haemophilus influenzae type b (Hib) vaccine  You may need this if you have certain conditions. You may receive vaccines as individual doses or as more than one vaccine together in one shot (combination vaccines). Talk with your health care provider about the risks and benefits of combination vaccines. What tests do I need? Blood tests  Lipid and cholesterol levels. These may be checked every 5 years, or more frequently depending on your overall health.  Hepatitis C test.  Hepatitis B test. Screening  Lung cancer screening. You may have this screening every year starting at age 10 if you have a  30-pack-year history of smoking and currently smoke or have quit within the past 15 years.  Colorectal cancer screening. All adults should have this screening starting at age 64 and continuing until age 42. Your health care provider may recommend screening at age 47 if you are at increased risk. You will have tests every 1-10 years, depending on your results and the type of screening test.  Prostate cancer screening. Recommendations will vary depending on your family history and other risks.  Diabetes screening. This is done by checking your blood sugar (glucose) after you have not eaten for a while (fasting). You may have this done every 1-3 years.  Abdominal aortic aneurysm (AAA) screening. You may need this if you are a current or former smoker.  Sexually transmitted disease (STD) testing. Follow these instructions at home: Eating and drinking  Eat a diet  that includes fresh fruits and vegetables, whole grains, lean protein, and low-fat dairy products. Limit your intake of foods with high amounts of sugar, saturated fats, and salt.  Take vitamin and mineral supplements as recommended by your health care provider.  Do not drink alcohol if your health care provider tells you not to drink.  If you drink alcohol: ? Limit how much you have to 0-2 drinks a day. ? Be aware of how much alcohol is in your drink. In the [place] one drink equals one 12 oz bottle of beer (355 mL), one 5 oz glass of wine (148 mL), or one 1 oz glass of hard liquor (44 mL). Lifestyle  Take daily care of your teeth and gums.  Stay active. Exercise for at least 30 minutes on 5 or more days each week.  Do not use any products that contain nicotine or tobacco, such as cigarettes, e-cigarettes, and chewing tobacco. If you need help quitting, ask your health care provider.  If you are sexually active, practice safe sex. Use a condom or other form of protection to prevent STIs (sexually transmitted infections).  Talk  with your health care provider about taking a low-dose aspirin or statin. What's next?  Visit your health care provider once a year for a well check visit.  Ask your health care provider how often you should have your eyes and teeth checked.  Stay up to date on all vaccines. This information is not intended to replace advice given to you by your health care provider. Make sure you discuss any questions you have with your health care provider. Document Released: 06/28/2015 Document Revised: 05/26/2018 Document Reviewed: 05/26/2018 Elsevier Patient Education  2020 [employer].

## 2019-04-28 ENCOUNTER — Telehealth: Payer: Self-pay

## 2019-04-28 NOTE — Telephone Encounter (Signed)
Copied from [place] [id number]. Topic: Referral - Status >> Apr 28, 2019 [id number] PM Simone Curia D wrote: [id number] Spoke with patient about [facility] [employer] [facility]. Ambrose Mantle [phone]

## 2019-05-07 ENCOUNTER — Encounter (HOSPITAL_BASED_OUTPATIENT_CLINIC_OR_DEPARTMENT_OTHER): Payer: Self-pay | Admitting: Emergency Medicine

## 2019-05-07 ENCOUNTER — Other Ambulatory Visit: Payer: Self-pay

## 2019-05-07 ENCOUNTER — Emergency Department (HOSPITAL_BASED_OUTPATIENT_CLINIC_OR_DEPARTMENT_OTHER): Payer: Medicare Other

## 2019-05-07 ENCOUNTER — Inpatient Hospital Stay (HOSPITAL_BASED_OUTPATIENT_CLINIC_OR_DEPARTMENT_OTHER)
Admission: EM | Admit: 2019-05-07 | Discharge: 2019-05-13 | DRG: 177 | Disposition: A | Discharge status: Home / Self Care | Payer: Medicare Other | Attending: Internal Medicine | Admitting: Internal Medicine

## 2019-05-07 DIAGNOSIS — M797 Fibromyalgia: Secondary | ICD-10-CM | POA: Diagnosis present

## 2019-05-07 DIAGNOSIS — J302 Other seasonal allergic rhinitis: Secondary | ICD-10-CM | POA: Diagnosis not present

## 2019-05-07 DIAGNOSIS — G9340 Encephalopathy, unspecified: Secondary | ICD-10-CM | POA: Diagnosis not present

## 2019-05-07 DIAGNOSIS — Z8249 Family history of ischemic heart disease and other diseases of the circulatory system: Secondary | ICD-10-CM

## 2019-05-07 DIAGNOSIS — J440 Chronic obstructive pulmonary disease with acute lower respiratory infection: Secondary | ICD-10-CM | POA: Diagnosis present

## 2019-05-07 DIAGNOSIS — I10 Essential (primary) hypertension: Secondary | ICD-10-CM | POA: Diagnosis present

## 2019-05-07 DIAGNOSIS — J1289 Other viral pneumonia: Secondary | ICD-10-CM | POA: Diagnosis not present

## 2019-05-07 DIAGNOSIS — I4891 Unspecified atrial fibrillation: Secondary | ICD-10-CM | POA: Diagnosis not present

## 2019-05-07 DIAGNOSIS — K219 Gastro-esophageal reflux disease without esophagitis: Secondary | ICD-10-CM | POA: Diagnosis present

## 2019-05-07 DIAGNOSIS — H409 Unspecified glaucoma: Secondary | ICD-10-CM | POA: Diagnosis present

## 2019-05-07 DIAGNOSIS — D696 Thrombocytopenia, unspecified: Secondary | ICD-10-CM | POA: Diagnosis not present

## 2019-05-07 DIAGNOSIS — Z801 Family history of malignant neoplasm of trachea, bronchus and lung: Secondary | ICD-10-CM

## 2019-05-07 DIAGNOSIS — Z9852 Vasectomy status: Secondary | ICD-10-CM

## 2019-05-07 DIAGNOSIS — G9341 Metabolic encephalopathy: Secondary | ICD-10-CM | POA: Diagnosis present

## 2019-05-07 DIAGNOSIS — J449 Chronic obstructive pulmonary disease, unspecified: Secondary | ICD-10-CM | POA: Diagnosis present

## 2019-05-07 DIAGNOSIS — Z79890 Hormone replacement therapy (postmenopausal): Secondary | ICD-10-CM

## 2019-05-07 DIAGNOSIS — Z981 Arthrodesis status: Secondary | ICD-10-CM

## 2019-05-07 DIAGNOSIS — Z87891 Personal history of nicotine dependence: Secondary | ICD-10-CM

## 2019-05-07 DIAGNOSIS — Z7901 Long term (current) use of anticoagulants: Secondary | ICD-10-CM | POA: Diagnosis not present

## 2019-05-07 DIAGNOSIS — N400 Enlarged prostate without lower urinary tract symptoms: Secondary | ICD-10-CM | POA: Diagnosis present

## 2019-05-07 DIAGNOSIS — J9601 Acute respiratory failure with hypoxia: Secondary | ICD-10-CM | POA: Diagnosis not present

## 2019-05-07 DIAGNOSIS — U071 COVID-19: Secondary | ICD-10-CM | POA: Diagnosis not present

## 2019-05-07 DIAGNOSIS — E1165 Type 2 diabetes mellitus with hyperglycemia: Secondary | ICD-10-CM | POA: Diagnosis not present

## 2019-05-07 DIAGNOSIS — G4700 Insomnia, unspecified: Secondary | ICD-10-CM | POA: Diagnosis present

## 2019-05-07 DIAGNOSIS — G4733 Obstructive sleep apnea (adult) (pediatric): Secondary | ICD-10-CM | POA: Diagnosis present

## 2019-05-07 DIAGNOSIS — Z79891 Long term (current) use of opiate analgesic: Secondary | ICD-10-CM

## 2019-05-07 DIAGNOSIS — T380X5A Adverse effect of glucocorticoids and synthetic analogues, initial encounter: Secondary | ICD-10-CM | POA: Diagnosis not present

## 2019-05-07 DIAGNOSIS — J9811 Atelectasis: Secondary | ICD-10-CM | POA: Diagnosis not present

## 2019-05-07 DIAGNOSIS — E66811 Obesity, class 1: Secondary | ICD-10-CM | POA: Diagnosis present

## 2019-05-07 DIAGNOSIS — M1990 Unspecified osteoarthritis, unspecified site: Secondary | ICD-10-CM | POA: Diagnosis present

## 2019-05-07 DIAGNOSIS — E039 Hypothyroidism, unspecified: Secondary | ICD-10-CM | POA: Diagnosis present

## 2019-05-07 DIAGNOSIS — E1142 Type 2 diabetes mellitus with diabetic polyneuropathy: Secondary | ICD-10-CM | POA: Diagnosis not present

## 2019-05-07 DIAGNOSIS — Z79899 Other long term (current) drug therapy: Secondary | ICD-10-CM

## 2019-05-07 DIAGNOSIS — I480 Paroxysmal atrial fibrillation: Secondary | ICD-10-CM | POA: Diagnosis not present

## 2019-05-07 DIAGNOSIS — R0602 Shortness of breath: Secondary | ICD-10-CM

## 2019-05-07 DIAGNOSIS — Z8042 Family history of malignant neoplasm of prostate: Secondary | ICD-10-CM

## 2019-05-07 DIAGNOSIS — E785 Hyperlipidemia, unspecified: Secondary | ICD-10-CM | POA: Diagnosis present

## 2019-05-07 DIAGNOSIS — F329 Major depressive disorder, single episode, unspecified: Secondary | ICD-10-CM | POA: Diagnosis present

## 2019-05-07 DIAGNOSIS — M109 Gout, unspecified: Secondary | ICD-10-CM | POA: Diagnosis present

## 2019-05-07 DIAGNOSIS — Z9981 Dependence on supplemental oxygen: Secondary | ICD-10-CM | POA: Diagnosis not present

## 2019-05-07 DIAGNOSIS — E119 Type 2 diabetes mellitus without complications: Secondary | ICD-10-CM | POA: Diagnosis not present

## 2019-05-07 DIAGNOSIS — M810 Age-related osteoporosis without current pathological fracture: Secondary | ICD-10-CM | POA: Diagnosis present

## 2019-05-07 DIAGNOSIS — Z9842 Cataract extraction status, left eye: Secondary | ICD-10-CM

## 2019-05-07 DIAGNOSIS — R05 Cough: Secondary | ICD-10-CM | POA: Diagnosis not present

## 2019-05-07 DIAGNOSIS — N3281 Overactive bladder: Secondary | ICD-10-CM | POA: Diagnosis present

## 2019-05-07 DIAGNOSIS — Z9049 Acquired absence of other specified parts of digestive tract: Secondary | ICD-10-CM

## 2019-05-07 DIAGNOSIS — R4182 Altered mental status, unspecified: Secondary | ICD-10-CM | POA: Diagnosis not present

## 2019-05-07 DIAGNOSIS — Z8616 Personal history of COVID-19: Secondary | ICD-10-CM

## 2019-05-07 DIAGNOSIS — Z9841 Cataract extraction status, right eye: Secondary | ICD-10-CM

## 2019-05-07 DIAGNOSIS — E669 Obesity, unspecified: Secondary | ICD-10-CM | POA: Diagnosis present

## 2019-05-07 DIAGNOSIS — Z6831 Body mass index (BMI) 31.0-31.9, adult: Secondary | ICD-10-CM

## 2019-05-07 DIAGNOSIS — Z7984 Long term (current) use of oral hypoglycemic drugs: Secondary | ICD-10-CM

## 2019-05-07 DIAGNOSIS — R531 Weakness: Secondary | ICD-10-CM | POA: Diagnosis not present

## 2019-05-07 DIAGNOSIS — R509 Fever, unspecified: Secondary | ICD-10-CM | POA: Diagnosis not present

## 2019-05-07 DIAGNOSIS — Z8261 Family history of arthritis: Secondary | ICD-10-CM

## 2019-05-07 DIAGNOSIS — Z8051 Family history of malignant neoplasm of kidney: Secondary | ICD-10-CM

## 2019-05-07 DIAGNOSIS — Z8679 Personal history of other diseases of the circulatory system: Secondary | ICD-10-CM

## 2019-05-07 LAB — CBC WITH DIFFERENTIAL/PLATELET
Abs Immature Granulocytes: 0.11 10*3/uL — ABNORMAL HIGH (ref 0.00–0.07)
Basophils Absolute: 0 10*3/uL (ref 0.0–0.1)
Basophils Relative: 0 %
Eosinophils Absolute: 0 10*3/uL (ref 0.0–0.5)
Eosinophils Relative: 1 %
HCT: 48.2 % (ref 39.0–52.0)
Hemoglobin: 15.1 g/dL (ref 13.0–17.0)
Immature Granulocytes: 2 %
Lymphocytes Relative: 25 %
Lymphs Abs: 1.2 10*3/uL (ref 0.7–4.0)
MCH: 29.5 pg (ref 26.0–34.0)
MCHC: 31.3 g/dL (ref 30.0–36.0)
MCV: 94.1 fL (ref 80.0–100.0)
Monocytes Absolute: 1.1 10*3/uL — ABNORMAL HIGH (ref 0.1–1.0)
Monocytes Relative: 24 %
Neutro Abs: 2.3 10*3/uL (ref 1.7–7.7)
Neutrophils Relative %: 48 %
Platelets: 91 10*3/uL — ABNORMAL LOW (ref 150–400)
RBC: 5.12 MIL/uL (ref 4.22–5.81)
RDW: 12.8 % (ref 11.5–15.5)
WBC: 4.8 10*3/uL (ref 4.0–10.5)
nRBC: 0 % (ref 0.0–0.2)

## 2019-05-07 LAB — PROCALCITONIN: Procalcitonin: <0.1 ng/mL

## 2019-05-07 LAB — D-DIMER, QUANTITATIVE: D-Dimer, Quant: 0.34 ug/mL-FEU (ref 0.00–0.50)

## 2019-05-07 LAB — COMPREHENSIVE METABOLIC PANEL
ALT: 18 U/L (ref 0–44)
AST: 27 U/L (ref 15–41)
Albumin: 4 g/dL (ref 3.5–5.0)
Alkaline Phosphatase: 111 U/L (ref 38–126)
Anion gap: 12 (ref 5–15)
BUN: 13 mg/dL (ref 8–23)
CO2: 24 mmol/L (ref 22–32)
Calcium: 8.7 mg/dL — ABNORMAL LOW (ref 8.9–10.3)
Chloride: 104 mmol/L (ref 98–111)
Creatinine, Ser: 1.12 mg/dL (ref 0.61–1.24)
GFR calc Af Amer: >60 mL/min (ref >60)
GFR calc non Af Amer: >60 mL/min (ref >60)
Glucose, Bld: 184 mg/dL — ABNORMAL HIGH (ref 70–99)
Potassium: 3.9 mmol/L (ref 3.5–5.1)
Sodium: 140 mmol/L (ref 135–145)
Total Bilirubin: 0.7 mg/dL (ref 0.3–1.2)
Total Protein: 6.8 g/dL (ref 6.5–8.1)

## 2019-05-07 LAB — FERRITIN: Ferritin: 95 ng/mL (ref 24–336)

## 2019-05-07 LAB — LACTATE DEHYDROGENASE: LDH: 150 U/L (ref 98–192)

## 2019-05-07 LAB — TRIGLYCERIDES: Triglycerides: 61 mg/dL (ref <150)

## 2019-05-07 LAB — PROTIME-INR
INR: 1.2 (ref 0.8–1.2)
Prothrombin Time: 15 seconds (ref 11.4–15.2)

## 2019-05-07 LAB — SARS CORONAVIRUS 2 AG (30 MIN TAT): SARS Coronavirus 2 Ag: POSITIVE — AB

## 2019-05-07 LAB — C-REACTIVE PROTEIN: CRP: 5.6 mg/dL — ABNORMAL HIGH (ref <1.0)

## 2019-05-07 LAB — LACTIC ACID, PLASMA
Lactic Acid, Venous: 1.8 mmol/L (ref 0.5–1.9)
Lactic Acid, Venous: 1.2 mmol/L (ref 0.5–1.9)

## 2019-05-07 LAB — CBG MONITORING, ED: Glucose-Capillary: 185 mg/dL — ABNORMAL HIGH (ref 70–99)

## 2019-05-07 LAB — FIBRINOGEN: Fibrinogen: 618 mg/dL — ABNORMAL HIGH (ref 210–475)

## 2019-05-07 MED ORDER — SODIUM CHLORIDE 0.9 % IV BOLUS
1000.0000 mL | Freq: Once | INTRAVENOUS | Status: AC
  Administered 2019-05-07: 1000 mL via INTRAVENOUS

## 2019-05-07 MED ORDER — ACETAMINOPHEN 325 MG PO TABS
650.0000 mg | ORAL_TABLET | Freq: Once | ORAL | Status: AC
  Administered 2019-05-11: 650 mg via ORAL
  Filled 2019-05-07 (×2): qty 2

## 2019-05-07 MED ORDER — DILTIAZEM HCL 100 MG IV SOLR: 5.0000 mg/h | INTRAVENOUS | Status: DC

## 2019-05-07 NOTE — ED Notes (Signed)
Patient's HR remains stable. MD steinl aware and Cardizem drip held for now.

## 2019-05-07 NOTE — Care Management (Signed)
85 year old man with a history of paroxysmal atrial fibrillation presents the emergency department with confusion.  His cardiologist is Crenshaw his primary physician is Burns.  He is Covid positive and found to have A. fib with RVR.  He had a fever of 100.8.  He is tachypneic as well.  He was not hypoxic.  Heart rate running about 150 on Eliquis.  Was given IV fluids with improvement in his rate initially now back up to the 120s and 130s.  I requested ED physician start diltiazem drip.  Chest x-ray looks okay, labs look okay except for platelets of 91,000, EKG shows A. fib with RVR and old septal infarct.  Discussed with Esm[facility] Plants who felt the patient was not hypoxic and would not meet criteria for admission there.  I have requested a progressive bed.

## 2019-05-07 NOTE — Plan of Care (Signed)
  Problem: Coping: Goal: Psychosocial and spiritual needs will be supported Outcome: Progressing   Problem: Respiratory: Goal: Will maintain a patent airway Outcome: Progressing   

## 2019-05-07 NOTE — ED Notes (Signed)
ED MD informed of +COVID

## 2019-05-07 NOTE — ED Provider Notes (Signed)
[facility] EMERGENCY DEPARTMENT Provider Note   CSN: [id number] Arrival date & time: 05/07/19  1201     History   Chief Complaint Chief Complaint  Patient presents with  . Altered Mental Status    HPI Jonathan [name] is a 85 y.o. male.      Altered Mental Status Presenting symptoms: confusion   Associated symptoms: fever   Associated symptoms: no abdominal pain, no rash and no weakness   Patient presents with fever and feeling bad.  Been feeling bad for last couple days.  Reportedly had confusion.  Has had a cough with some clear sputum production.  History of COPD.  No dysuria.  No abdominal pain.  No known sick contacts.  No headaches.  No recent change in medicines.  History of atrial fibrillation and is on Eliquis.  No diarrhea or constipation.  Past Medical History:  Diagnosis Date  . Atrial fibrillation (HCC)    a. paroxysmal, on Eliquis for anticoagulation  . BPH (benign prostatic hyperplasia)   . Bronchitis   . Cataract   . COPD (chronic obstructive pulmonary disease) (HCC)    2 liters O2 HS  . Depression   . Fatty tumor    waste and back  . Fibromyalgia   . GERD (gastroesophageal reflux disease)   . Glaucoma    bilateral eyes  . Gout   . Hereditary and idiopathic peripheral neuropathy 08/28/2015  . Hypertension   . Hypothyroidism   . Hypoxia   . Insomnia   . Osteoarthritis   . Osteoporosis   . Peripheral neuropathy   . Sleep apnea    uses cpap-add oxygen at night  . Spinal compression fracture (HCC) seventh vertebre  . Transient alteration of awareness 08/28/2015  . Type 2 diabetes mellitus ([place]) 03/08/2016    Patient Active Problem List   Diagnosis Date Noted  . Paroxysmal atrial fibrillation with RVR ([place]) 05/07/2019  . Obstructive sleep apnea   . Acute cystitis without hematuria 05/18/2018  . Chronic anticoagulation 12/21/2017  . Mid back pain on left side 08/10/2017  . Spinal compression fracture ([place])   . Osteoarthritis   .  Hypoxia   . Fibromyalgia   . Fatty tumor   . Cataract   . BPH (benign prostatic hyperplasia)   . PAF (paroxysmal atrial fibrillation) ([place])   . Insomnia 03/10/2017  . Chronic pain, legs and back 03/10/2017  . Hyperlipidemia 03/10/2017  . Obesity (BMI 30.0-34.9) 12/08/2016  . Depression 09/07/2016  . Hypothyroidism 03/08/2016  . GERD (gastroesophageal reflux disease) 03/08/2016  . Gout 03/08/2016  . Glaucoma 03/08/2016  . Type 2 diabetes mellitus ([place]) 03/08/2016  . Hereditary and idiopathic peripheral neuropathy 08/28/2015  . Bruit 08/15/2013  . Essential hypertension 12/04/2011  . Allergic rhinitis, seasonal 12/04/2011  . COPD (chronic obstructive pulmonary disease) ([place]) 07/18/2011    Past Surgical History:  Procedure Laterality Date  . APPENDECTOMY  age 37  . CARPAL TUNNEL RELEASE Right    early 2000s  . CATARACT EXTRACTION     bilateral  . CHOLECYSTECTOMY  age 62  . EYE SURGERY    . FOOT ARTHRODESIS Right 02/02/2013   Procedure: RIGHT HALLUX METATARSAL PHALANGEAL JOINT ARTHRODESIS ;  Surgeon: Wylene Simmer, MD;  Location: [facility];  Service: Orthopedics;  Laterality: Right;  . INGUINAL HERNIA REPAIR  age 81   rt side  . NASAL CONCHA BULLOSA RESECTION  age 5  . PROSTATE SURGERY    . SHOULDER ARTHROSCOPY W/ ROTATOR CUFF REPAIR  Right    early 2000s  . tonsil    . VASECTOMY  age 67        Home Medications    Prior to Admission medications   Medication Sig Start Date End Date Taking? Authorizing Provider  acetaminophen (TYLENOL) 500 MG tablet Take 2 tablets (1,000 mg total) by mouth every 8 (eight) hours as needed. Patient taking differently: Take 1,000 mg by mouth 2 (two) times daily.  04/15/17   Binnie Rail, MD  acyclovir ointment (ZOVIRAX) 5 % Apply 1 application topically every 3 (three) hours. 02/09/19   Binnie Rail, MD  allopurinol (ZYLOPRIM) 300 MG tablet TAKE 1 TABLET BY MOUTH  EVERY EVENING 11/28/18   Burns, Claudina Lick, MD  apixaban  (ELIQUIS) 5 MG TABS tablet Take 1 tablet (5 mg total) by mouth 2 (two) times daily. 11/11/18   Binnie Rail, MD  atorvastatin (LIPITOR) 20 MG tablet Take 1 tablet (20 mg total) by mouth daily at 6 PM. 08/10/18   Burns, Claudina Lick, MD  Biotin 5000 MCG TABS Take 5,000 mcg by mouth daily.    [provider]  blood glucose meter kit and supplies KIT Dispense based on patient and insurance preference. Use daily as directed. (FOR E11.9). 02/09/19   Binnie Rail, MD  brimonidine (ALPHAGAN) 0.15 % ophthalmic solution Place 1 drop into the left eye 2 (two) times daily.  12/06/14   [provider]  calcium-vitamin D (OSCAL WITH D) 500-200 MG-UNIT tablet Take 1 tablet by mouth daily with breakfast.    [provider]  colchicine (COLCRYS) 0.6 MG tablet Take 0.6 mg by mouth daily as needed (flareups).     [provider]  diltiazem (CARDIZEM CD) 300 MG 24 hr capsule TAKE 1 CAPSULE BY MOUTH  DAILY 04/04/19   Lelon Perla, MD  doxazosin (CARDURA) 8 MG tablet TAKE 1 TABLET BY MOUTH  DAILY. 03/15/19   Burns, Claudina Lick, MD  DULoxetine (CYMBALTA) 30 MG capsule TAKE 2 CAPSULES BY MOUTH IN THE MORNING AND 1 CAPSULE  IN THE EVENING 04/14/19   Burns, Claudina Lick, MD  glucose blood test strip Use to check blood sugar daily. E11.9 11/29/18   Binnie Rail, MD  hydrochlorothiazide (HYDRODIURIL) 25 MG tablet TAKE 1 TABLET BY MOUTH  DAILY 08/10/18   Lelon Perla, MD  Lancets MISC Use to check blood sugars daily. E11.9 11/29/18   Binnie Rail, MD  latanoprost (XALATAN) 0.005 % ophthalmic solution Place 1 drop into both eyes at bedtime. 05/02/12   Posey Boyer, MD  levalbuterol [facility] HFA) 45 MCG/ACT inhaler Inhale 1-2 puffs into the lungs every 4 (four) hours as needed for wheezing. 07/06/13   Collene Gobble, MD  levothyroxine (SYNTHROID) 112 MCG tablet TAKE 1 TABLET BY MOUTH EVERY DAY 03/16/19   Binnie Rail, MD  magnesium oxide (MAG-OX) 400 MG tablet Take 400 mg by mouth daily.     [provider]  metFORMIN (GLUCOPHAGE XR) 750 MG 24 hr tablet Take 1 tablet (750 mg total) by mouth daily with supper. 02/09/19   Burns, Claudina Lick, MD  mometasone (NASONEX) 50 MCG/ACT nasal spray Place 2 sprays into the nose daily. 02/15/18   Binnie Rail, MD  Multiple Vitamin (MULTIVITAMIN) tablet Take 1 tablet by mouth daily.    [provider]  nitroGLYCERIN (NITROSTAT) 0.4 MG SL tablet Place 1 tablet (0.4 mg total) under the tongue every 5 (five) minutes as needed for chest pain.  12/13/14   Barrett, Evelene Croon, PA-C  omeprazole (PRILOSEC) 20 MG capsule TAKE 1 CAPSULE BY MOUTH  DAILY 03/20/19   Burns, Claudina Lick, MD  oxyCODONE-acetaminophen (PERCOCET) 10-325 MG tablet Take 1 tablet by mouth every 8 (eight) hours as needed for pain.    [provider]  potassium chloride SA (K-DUR) 20 MEQ tablet Take 1 tablet (20 mEq total) by mouth daily. 02/15/19   Binnie Rail, MD  solifenacin (VESICARE) 5 MG tablet Take 5 mg by mouth daily. 03/21/19   [provider]  tadalafil (CIALIS) 20 MG tablet Take 1 tablet (20 mg total) by mouth daily as needed. For erectile dysfunction 12/08/16   Binnie Rail, MD  tolterodine (DETROL LA) 4 MG 24 hr capsule Take 4 mg by mouth daily.    [provider]  vitamin C (ASCORBIC ACID) 500 MG tablet Take 1,000 mg by mouth daily.     [provider]  VITAMIN D, CHOLECALCIFEROL, PO Take 50 mcg by mouth daily.    [provider]  zaleplon (SONATA) 10 MG capsule Take 1 capsule (10 mg total) by mouth at bedtime. 02/09/19   Binnie Rail, MD  zolpidem (AMBIEN) 10 MG tablet TAKE 1 TABLET (10 MG TOTAL) BY MOUTH AT BEDTIME AS NEEDED FOR SLEEP. 04/12/19   Binnie Rail, MD    Family History Family History  Problem Relation Age of Onset  . Coronary artery disease Brother   . Heart disease Father   . Lung cancer Father   . Kidney cancer Father   . Prostate cancer Father   . Arthritis Mother   . Lung cancer Mother   . Dementia  Mother        Unspecified type, not Alzheimer's disease    Social History Social History   Tobacco Use  . Smoking status: Former Smoker    Packs/day: 2.00    Years: 10.00    Pack years: 20.00    Types: Cigarettes    Quit date: 07/18/1975    Years since quitting: 43.8  . Smokeless tobacco: Never Used  Substance Use Topics  . Alcohol use: Yes    Comment: 1-2 drinks/day  . Drug use: No     Allergies   Other   Review of Systems Review of Systems  Constitutional: Positive for fatigue and fever.  HENT: Negative for congestion.   Respiratory: Positive for cough.   Cardiovascular: Negative for chest pain.  Gastrointestinal: Negative for abdominal pain.  Genitourinary: Negative for flank pain.  Musculoskeletal: Negative for back pain.  Skin: Negative for rash.  Neurological: Negative for weakness.  Psychiatric/Behavioral: Positive for confusion.     Physical Exam Updated Vital Signs BP 129/78   Pulse 81   Temp (!) 100.8 F (38.2 C) (Oral)   Resp (!) 22   Ht 5' 7"  (1.702 m)   Wt 89.7 kg   SpO2 95%   BMI 30.96 kg/m   Physical Exam Vitals signs and nursing note reviewed.  HENT:     Head: Normocephalic.  Eyes:     Extraocular Movements: Extraocular movements intact.  Neck:     Musculoskeletal: Neck supple.  Cardiovascular:     Rate and Rhythm: Tachycardia present. Rhythm irregular.  Pulmonary:     Comments: Tachypnea with some rales bilateral bases. Abdominal:     General: There is distension.     Tenderness: There is no abdominal tenderness.  Musculoskeletal:     Right lower leg: No edema.  Left lower leg: No edema.  Skin:    General: Skin is warm.     Capillary Refill: Capillary refill takes less than 2 seconds.  Neurological:     Mental Status: He is alert.     Comments: Patient is awake and appropriate and able answer questions.      ED Treatments / Results  Labs (all labs ordered are listed, but only abnormal results are displayed) Labs  Reviewed  SARS CORONAVIRUS 2 AG (30 MIN TAT) - Abnormal; Notable for the following components:      Result Value   SARS Coronavirus 2 Ag POSITIVE (*)    All other components within normal limits  COMPREHENSIVE METABOLIC PANEL - Abnormal; Notable for the following components:   Glucose, Bld 184 (*)    Calcium 8.7 (*)    All other components within normal limits  CBC WITH DIFFERENTIAL/PLATELET - Abnormal; Notable for the following components:   Platelets 91 (*)    Monocytes Absolute 1.1 (*)    Abs Immature Granulocytes 0.11 (*)    All other components within normal limits  CBG MONITORING, ED - Abnormal; Notable for the following components:   Glucose-Capillary 185 (*)    All other components within normal limits  CULTURE, BLOOD (ROUTINE X 2)  CULTURE, BLOOD (ROUTINE X 2)  LACTIC ACID, PLASMA  LACTIC ACID, PLASMA  PROTIME-INR  URINALYSIS, ROUTINE W REFLEX MICROSCOPIC  D-DIMER, QUANTITATIVE (NOT AT [facility])  PROCALCITONIN  LACTATE DEHYDROGENASE  FERRITIN  TRIGLYCERIDES  FIBRINOGEN  C-REACTIVE PROTEIN    EKG EKG Interpretation  Date/Time:  Sunday May 07 2019 12:21:52 EST Ventricular Rate:  131 PR Interval:    QRS Duration: 80 QT Interval:  310 QTC Calculation: 457 R Axis:   87 Text Interpretation: Atrial fibrillation with rapid ventricular response Septal infarct , age undetermined Abnormal ECG Confirmed by Davonna Belling [phone]) on 05/07/2019 12:33:25 PM   Radiology Dg Chest Portable 1 View  Result Date: 05/07/2019 CLINICAL DATA:  Fever and cough EXAM: PORTABLE CHEST 1 VIEW COMPARISON:  Chest radiograph dated 07/23/2017 FINDINGS: The heart size is normal accounting for technique. Vascular calcifications are seen in the aortic arch. Both lungs are clear. The visualized skeletal structures are unremarkable. IMPRESSION: No active disease. Electronically Signed   By: Zerita Boers M.D.   On: 05/07/2019 12:58    Procedures Procedures (including critical care time)   Medications Ordered in ED Medications  diltiazem (CARDIZEM) 100 mg in dextrose 5 % 100 mL (1 mg/mL) infusion (has no administration in time range)  acetaminophen (TYLENOL) tablet 650 mg (has no administration in time range)  sodium chloride 0.9 % bolus 1,000 mL (0 mLs Intravenous Stopped 05/07/19 1425)     Initial Impression / Assessment and Plan / ED Course  I have reviewed the triage vital signs and the nursing notes.  Pertinent labs & imaging results that were available during my care of the patient were reviewed by me and considered in my medical decision making (see chart for details).       Patient presented with weakness and fever.  Positive Covid test.  Also found to be in atrial fibrillation with RVR.  Febrile.  History of paroxysmal A. fib and is already on Eliquis.  Rate decreased with IV fluids but still at times will go up to around 120.  Discussed with Dr. Delphia Grates about admission at [facility], however patient does not have an oxygen requirement and suggest admission a different hospital.  Discussed with Dr. Evangeline Gula.  Accepted in transfer.  Will start low-dose Cardizem drip for rate control and will somewhat limit fluids due to his Covid status.  CRITICAL CARE Performed by: Davonna Belling Total critical care time: 30 minutes Critical care time was exclusive of separately billable procedures and treating other patients. Critical care was necessary to treat or prevent imminent or life-threatening deterioration. Critical care was time spent personally by me on the following activities: development of treatment plan with patient and/or surrogate as well as nursing, discussions with consultants, evaluation of patient's response to treatment, examination of patient, obtaining history from patient or surrogate, ordering and performing treatments and interventions, ordering and review of laboratory studies, ordering and review of radiographic studies, pulse oximetry and re-evaluation  of patient's condition.   Final Clinical Impressions(s) / ED Diagnoses   Final diagnoses:  COVID-19  Atrial fibrillation with rapid ventricular response [facility])    ED Discharge Orders    None       Davonna Belling, MD 05/07/19 1512

## 2019-05-07 NOTE — ED Triage Notes (Signed)
Pts spouse reports confusion that started with pt waking. Pt denies dysuria but spouse reports hx of similar symptoms with UTI. Pt denies headache, reports feeling tired.

## 2019-05-07 NOTE — ED Notes (Signed)
Patients HR is stable at this time. Afib in the 80's

## 2019-05-08 ENCOUNTER — Inpatient Hospital Stay (HOSPITAL_COMMUNITY): Payer: Medicare Other

## 2019-05-08 ENCOUNTER — Telehealth: Payer: Self-pay

## 2019-05-08 ENCOUNTER — Encounter (HOSPITAL_COMMUNITY): Payer: Self-pay | Admitting: Internal Medicine

## 2019-05-08 DIAGNOSIS — G9340 Encephalopathy, unspecified: Secondary | ICD-10-CM

## 2019-05-08 DIAGNOSIS — Z8616 Personal history of COVID-19: Secondary | ICD-10-CM

## 2019-05-08 DIAGNOSIS — U071 COVID-19: Principal | ICD-10-CM

## 2019-05-08 DIAGNOSIS — I4891 Unspecified atrial fibrillation: Secondary | ICD-10-CM

## 2019-05-08 HISTORY — DX: Encephalopathy, unspecified: G93.40

## 2019-05-08 HISTORY — DX: Personal history of COVID-19: Z86.16

## 2019-05-08 LAB — GLUCOSE, CAPILLARY
Glucose-Capillary: 173 mg/dL — ABNORMAL HIGH (ref 70–99)
Glucose-Capillary: 174 mg/dL — ABNORMAL HIGH (ref 70–99)
Glucose-Capillary: 119 mg/dL — ABNORMAL HIGH (ref 70–99)
Glucose-Capillary: 125 mg/dL — ABNORMAL HIGH (ref 70–99)

## 2019-05-08 LAB — ABO/RH: ABO/RH(D): A POS

## 2019-05-08 LAB — CBC WITH DIFFERENTIAL/PLATELET
Abs Immature Granulocytes: 0.09 10*3/uL — ABNORMAL HIGH (ref 0.00–0.07)
Basophils Absolute: 0 10*3/uL (ref 0.0–0.1)
Basophils Relative: 0 %
Eosinophils Absolute: 0 10*3/uL (ref 0.0–0.5)
Eosinophils Relative: 0 %
HCT: 41.6 % (ref 39.0–52.0)
Hemoglobin: 13.5 g/dL (ref 13.0–17.0)
Immature Granulocytes: 2 %
Lymphocytes Relative: 24 %
Lymphs Abs: 1 10*3/uL (ref 0.7–4.0)
MCH: 30.2 pg (ref 26.0–34.0)
MCHC: 32.5 g/dL (ref 30.0–36.0)
MCV: 93.1 fL (ref 80.0–100.0)
Monocytes Absolute: 1 10*3/uL (ref 0.1–1.0)
Monocytes Relative: 24 %
Neutro Abs: 2 10*3/uL (ref 1.7–7.7)
Neutrophils Relative %: 50 %
Platelets: 83 10*3/uL — ABNORMAL LOW (ref 150–400)
RBC: 4.47 MIL/uL (ref 4.22–5.81)
RDW: 12.8 % (ref 11.5–15.5)
WBC: 4 10*3/uL (ref 4.0–10.5)
nRBC: 0 % (ref 0.0–0.2)

## 2019-05-08 LAB — URINALYSIS, ROUTINE W REFLEX MICROSCOPIC
Bilirubin Urine: NEGATIVE
Glucose, UA: NEGATIVE mg/dL
Hgb urine dipstick: NEGATIVE
Ketones, ur: 20 mg/dL — AB
Nitrite: NEGATIVE
Protein, ur: 100 mg/dL — AB
Specific Gravity, Urine: 1.017 (ref 1.005–1.030)
pH: 5 (ref 5.0–8.0)

## 2019-05-08 LAB — MRSA PCR SCREENING: MRSA by PCR: NEGATIVE

## 2019-05-08 LAB — C-REACTIVE PROTEIN: CRP: 9.6 mg/dL — ABNORMAL HIGH (ref <1.0)

## 2019-05-08 LAB — COMPREHENSIVE METABOLIC PANEL
ALT: 16 U/L (ref 0–44)
AST: 17 U/L (ref 15–41)
Albumin: 3.1 g/dL — ABNORMAL LOW (ref 3.5–5.0)
Alkaline Phosphatase: 85 U/L (ref 38–126)
Anion gap: 9 (ref 5–15)
BUN: 9 mg/dL (ref 8–23)
CO2: 26 mmol/L (ref 22–32)
Calcium: 8.1 mg/dL — ABNORMAL LOW (ref 8.9–10.3)
Chloride: 104 mmol/L (ref 98–111)
Creatinine, Ser: 0.98 mg/dL (ref 0.61–1.24)
GFR calc Af Amer: >60 mL/min (ref >60)
GFR calc non Af Amer: >60 mL/min (ref >60)
Glucose, Bld: 153 mg/dL — ABNORMAL HIGH (ref 70–99)
Potassium: 3.6 mmol/L (ref 3.5–5.1)
Sodium: 139 mmol/L (ref 135–145)
Total Bilirubin: 0.9 mg/dL (ref 0.3–1.2)
Total Protein: 5.4 g/dL — ABNORMAL LOW (ref 6.5–8.1)

## 2019-05-08 LAB — TROPONIN I (HIGH SENSITIVITY)
Troponin I (High Sensitivity): 14 ng/L (ref <18)
Troponin I (High Sensitivity): 17 ng/L (ref <18)

## 2019-05-08 LAB — D-DIMER, QUANTITATIVE: D-Dimer, Quant: 0.47 ug/mL-FEU (ref 0.00–0.50)

## 2019-05-08 LAB — FERRITIN: Ferritin: 115 ng/mL (ref 24–336)

## 2019-05-08 LAB — TSH: TSH: 1.702 u[IU]/mL (ref 0.350–4.500)

## 2019-05-08 LAB — BRAIN NATRIURETIC PEPTIDE: B Natriuretic Peptide: 90.8 pg/mL (ref 0.0–100.0)

## 2019-05-08 MED ORDER — LEVALBUTEROL TARTRATE 45 MCG/ACT IN AERO: 1.0000 | INHALATION_SPRAY | RESPIRATORY_TRACT | Status: DC | PRN

## 2019-05-08 MED ORDER — DULOXETINE HCL 60 MG PO CPEP: 60.0000 mg | ORAL_CAPSULE | Freq: Every day | ORAL | Status: DC

## 2019-05-08 MED ORDER — ACETAMINOPHEN 650 MG RE SUPP: 650.0000 mg | Freq: Four times a day (QID) | RECTAL | Status: DC | PRN

## 2019-05-08 MED ORDER — DULOXETINE HCL 60 MG PO CPEP
60.0000 mg | ORAL_CAPSULE | Freq: Every day | ORAL | Status: DC
  Administered 2019-05-08 – 2019-05-10 (×3): 60 mg via ORAL
  Filled 2019-05-08 (×4): qty 1

## 2019-05-08 MED ORDER — DILTIAZEM HCL ER COATED BEADS 180 MG PO CP24
300.0000 mg | ORAL_CAPSULE | Freq: Every day | ORAL | Status: DC
  Administered 2019-05-08 – 2019-05-13 (×6): 300 mg via ORAL
  Filled 2019-05-08 (×6): qty 1

## 2019-05-08 MED ORDER — DOXAZOSIN MESYLATE 8 MG PO TABS
8.0000 mg | ORAL_TABLET | Freq: Every day | ORAL | Status: DC
  Administered 2019-05-08 – 2019-05-13 (×6): 8 mg via ORAL
  Filled 2019-05-08: qty 4
  Filled 2019-05-08 (×3): qty 1
  Filled 2019-05-08 (×2): qty 4

## 2019-05-08 MED ORDER — LEVOTHYROXINE SODIUM 112 MCG PO TABS
112.0000 ug | ORAL_TABLET | Freq: Every day | ORAL | Status: DC
  Administered 2019-05-08 – 2019-05-13 (×6): 112 ug via ORAL
  Filled 2019-05-08 (×7): qty 1

## 2019-05-08 MED ORDER — ZOLPIDEM TARTRATE 5 MG PO TABS: 5.0000 mg | ORAL_TABLET | Freq: Every evening | ORAL | Status: DC | PRN

## 2019-05-08 MED ORDER — INSULIN ASPART 100 UNIT/ML ~~LOC~~ SOLN
0.0000 [IU] | Freq: Three times a day (TID) | SUBCUTANEOUS | Status: DC
  Administered 2019-05-08 – 2019-05-09 (×4): 2 [IU] via SUBCUTANEOUS
  Administered 2019-05-09: 1 [IU] via SUBCUTANEOUS
  Administered 2019-05-10: 2 [IU] via SUBCUTANEOUS
  Administered 2019-05-10: 1 [IU] via SUBCUTANEOUS
  Administered 2019-05-11: 9 [IU] via SUBCUTANEOUS
  Administered 2019-05-11 (×2): 5 [IU] via SUBCUTANEOUS
  Administered 2019-05-12 (×2): 7 [IU] via SUBCUTANEOUS
  Administered 2019-05-12 – 2019-05-13 (×2): 3 [IU] via SUBCUTANEOUS

## 2019-05-08 MED ORDER — PANTOPRAZOLE SODIUM 40 MG PO TBEC
40.0000 mg | DELAYED_RELEASE_TABLET | Freq: Every day | ORAL | Status: DC
  Administered 2019-05-08 – 2019-05-09 (×2): 40 mg via ORAL
  Filled 2019-05-08 (×2): qty 1

## 2019-05-08 MED ORDER — DARIFENACIN HYDROBROMIDE ER 7.5 MG PO TB24
7.5000 mg | ORAL_TABLET | Freq: Every day | ORAL | Status: DC
  Administered 2019-05-11 – 2019-05-13 (×3): 7.5 mg via ORAL
  Filled 2019-05-08 (×5): qty 1

## 2019-05-08 MED ORDER — ONDANSETRON HCL 4 MG PO TABS: 4.0000 mg | ORAL_TABLET | Freq: Four times a day (QID) | ORAL | Status: DC | PRN

## 2019-05-08 MED ORDER — ATORVASTATIN CALCIUM 10 MG PO TABS
20.0000 mg | ORAL_TABLET | Freq: Every day | ORAL | Status: DC
  Administered 2019-05-08 – 2019-05-12 (×5): 20 mg via ORAL
  Filled 2019-05-08 (×5): qty 2

## 2019-05-08 MED ORDER — POTASSIUM CHLORIDE CRYS ER 20 MEQ PO TBCR
20.0000 meq | EXTENDED_RELEASE_TABLET | Freq: Every day | ORAL | Status: DC
  Administered 2019-05-08 – 2019-05-13 (×5): 20 meq via ORAL
  Filled 2019-05-08 (×6): qty 1

## 2019-05-08 MED ORDER — OXYCODONE-ACETAMINOPHEN 10-325 MG PO TABS: 1.0000 | ORAL_TABLET | Freq: Three times a day (TID) | ORAL | Status: DC | PRN

## 2019-05-08 MED ORDER — VITAMIN C 500 MG PO TABS
1000.0000 mg | ORAL_TABLET | Freq: Every day | ORAL | Status: DC
  Administered 2019-05-08 – 2019-05-13 (×6): 1000 mg via ORAL
  Filled 2019-05-08 (×6): qty 2

## 2019-05-08 MED ORDER — ACETAMINOPHEN 325 MG PO TABS: 650.0000 mg | ORAL_TABLET | Freq: Four times a day (QID) | ORAL | Status: DC | PRN

## 2019-05-08 MED ORDER — BRIMONIDINE TARTRATE 0.2 % OP SOLN
1.0000 [drp] | Freq: Two times a day (BID) | OPHTHALMIC | Status: DC
  Administered 2019-05-08 – 2019-05-13 (×10): 1 [drp] via OPHTHALMIC
  Filled 2019-05-08: qty 5

## 2019-05-08 MED ORDER — ALLOPURINOL 100 MG PO TABS
300.0000 mg | ORAL_TABLET | Freq: Every evening | ORAL | Status: DC
  Administered 2019-05-08 – 2019-05-12 (×5): 300 mg via ORAL
  Filled 2019-05-08 (×2): qty 3
  Filled 2019-05-08 (×2): qty 1
  Filled 2019-05-08: qty 3

## 2019-05-08 MED ORDER — OXYCODONE-ACETAMINOPHEN 5-325 MG PO TABS: 1.0000 | ORAL_TABLET | Freq: Three times a day (TID) | ORAL | Status: DC | PRN

## 2019-05-08 MED ORDER — LATANOPROST 0.005 % OP SOLN
1.0000 [drp] | Freq: Every day | OPHTHALMIC | Status: DC
  Administered 2019-05-08 – 2019-05-12 (×5): 1 [drp] via OPHTHALMIC
  Filled 2019-05-08: qty 2.5

## 2019-05-08 MED ORDER — APIXABAN 5 MG PO TABS
5.0000 mg | ORAL_TABLET | Freq: Two times a day (BID) | ORAL | Status: DC
  Administered 2019-05-08 – 2019-05-13 (×12): 5 mg via ORAL
  Filled 2019-05-08 (×12): qty 1

## 2019-05-08 MED ORDER — FLUTICASONE PROPIONATE 50 MCG/ACT NA SUSP
2.0000 | Freq: Every day | NASAL | Status: DC
  Administered 2019-05-09 – 2019-05-13 (×4): 2 via NASAL
  Filled 2019-05-08: qty 16

## 2019-05-08 MED ORDER — MAGNESIUM OXIDE 400 (241.3 MG) MG PO TABS
400.0000 mg | ORAL_TABLET | Freq: Every day | ORAL | Status: DC
  Administered 2019-05-08 – 2019-05-13 (×6): 400 mg via ORAL
  Filled 2019-05-08 (×6): qty 1

## 2019-05-08 MED ORDER — OXYCODONE HCL 5 MG PO TABS: 5.0000 mg | ORAL_TABLET | Freq: Three times a day (TID) | ORAL | Status: DC | PRN

## 2019-05-08 MED ORDER — ONDANSETRON HCL 4 MG/2ML IJ SOLN: 4.0000 mg | Freq: Four times a day (QID) | INTRAMUSCULAR | Status: DC | PRN

## 2019-05-08 MED ORDER — NITROGLYCERIN 0.4 MG SL SUBL: 0.4000 mg | SUBLINGUAL_TABLET | SUBLINGUAL | Status: DC | PRN

## 2019-05-08 MED ORDER — DULOXETINE HCL 30 MG PO CPEP
30.0000 mg | ORAL_CAPSULE | Freq: Every day | ORAL | Status: DC
  Administered 2019-05-08 – 2019-05-09 (×2): 30 mg via ORAL
  Filled 2019-05-08 (×4): qty 1

## 2019-05-08 NOTE — Progress Notes (Addendum)
PROGRESS NOTE    Jonathan [name]  HEN:[id number] DOB: December 29, 1932 DOA: 05/07/2019 PCP: Binnie Rail, MD   Brief Narrative:  Jonathan [name] is Jonathan [name] 85 y.o. male with history of Jonathan [name]. fib, hypothyroidism, diabetes mellitus, COPD, previous history of subdural hematoma was brought to the ER at [facility] with having increasing confusion.  Patient's confusion started since waking up.  Per report patient did not have any fall or any weakness of the extremities.  Has been having some cough but no nausea vomiting or diarrhea chest pain or shortness of breath.  ED Course: In the ER patient is found to be mildly febrile with temperature 100.8 heart rate was around 154 in [place]ev[place] Arriola. fib with RVR.  Patient was started on Cardizem infusion.  Covid test came back positive.  Patient inflammatory markers was showing ferritin of 95 LDH of 150 CRP 5.6 procalcitonin less than 0.10 chest x-ray was unremarkable.  Patient admitted for further management of acute encephalopathy with Covid infection and Jonathan [name]. fib with RVR.  Assessment & Plan:   Principal Problem:   Atrial fibrillation with rapid ventricular response (HCC) Active Problems:   COPD (chronic obstructive pulmonary disease) (HCC)   Essential hypertension   Hypothyroidism   GERD (gastroesophageal reflux disease)   Type 2 diabetes mellitus (HCC)   Obesity (BMI 30.0-34.9)   Osteoarthritis   Chronic anticoagulation   Obstructive sleep apnea   COVID-19   Acute encephalopathy   1. Acute encephalopathy likely from Covid infection appears to be back at baseline at this time.  1. Head CT without acute abnormality 2. Will continue to monitor, PT/OT evals 3. Blood cx NGTD 4. UA not clearly concerning for UTI -> follow urine cx  2. COVID-19 infection presently not hypoxic and chest x-ray does not show any definite infiltrates. 1. Had charted vitals with O2 sat of 89 on RA, but CXR from 11/23 without acute abnormality (L midlung and R basilar subsegmental  atelectasis) 2. Doing well on RA at this time. 3. Continue to hold on starting steroids or remdesivir 4. Follow daily inflammatory markers  COVID-19 Labs  Recent Labs    05/07/19 1422 05/08/19 0324  DDIMER 0.34 0.47  FERRITIN 95 115  LDH 150  --   CRP 5.6* 9.6*    Lab Results  Component Value Date   SARSCOV2NAA Not Detected 12/23/2018   3. Jonathan [name]. fib with RVR - now sinus rhythm.  Continue diltiazem.   TSH wnl.  Continue eliquis.   4. Diabetes mellitus type 2 we will keep patient on sliding scale coverage.  On metformin at home.  BG's reasonable.  Follow.  5. Hypothyroidism Synthroid.  6. History of COPD presently not wheezing continue inhalers.  7. Gout: allopurinol  8. Glaucoma: continue eye drops  9. HLD: continue lipitor  10. HTN: continue doxazosin, HCTZ on hold  11. Overactive Bladder: enablex  12. BPH: continue doxazosin  13. Insomnia: ambien  14. Thrombocytopenia: chronic, worse than previous values - possibly related to viral infection, follow  DVT prophylaxis: eliquis Code Status: full  Family Communication: none at bedside Disposition Plan: pending  Consultants:   none  Procedures:   none  Antimicrobials:  Anti-infectives (From admission, onward)   None         Subjective: Feels better Jonathan [name]&Ox3 Presented due to confusion, improved  Objective: Vitals:   05/08/19 0500 05/08/19 0545 05/08/19 0744 05/08/19 1310  BP:  (!) 156/58 (!) 170/79 (!) 156/70  Pulse:  76 93 94  Resp:  20 (!) 22 20  Temp:   98.5 F (36.9 C)   TempSrc:   Oral   SpO2:  95% 92% 96%  Weight: 90.3 kg     Height:        Intake/Output Summary (Last 24 hours) at 05/08/2019 1502 Last data filed at 05/08/2019 1107 Gross per 24 hour  Intake 240 ml  Output 200 ml  Net 40 ml   Filed Weights   05/07/19 1222 05/07/19 2222 05/08/19 0500  Weight: 89.7 kg 90.3 kg 90.3 kg    Examination:  General exam: Appears calm and comfortable  Respiratory system: unlabored  Cardiovascular system: RRR Gastrointestinal system: Abdomen is nondistended, soft and nontender. Central nervous system: Alert and oriented. No focal neurological deficits. Extremities: no LEE Skin: No rashes, lesions or ulcers Psychiatry: Judgement and insight appear normal. Mood & affect appropriate.     Data Reviewed: I have personally reviewed following labs and imaging studies  CBC: Recent Labs  Lab 05/07/19 1234 05/08/19 0324  WBC 4.8 4.0  NEUTROABS 2.3 2.0  HGB 15.1 13.5  HCT 48.2 41.6  MCV 94.1 93.1  PLT 91* 83*   Basic Metabolic Panel: Recent Labs  Lab 05/07/19 1234 05/08/19 0324  NA 140 139  K 3.9 3.6  CL 104 104  CO2 24 26  GLUCOSE 184* 153*  BUN 13 9  CREATININE 1.12 0.98  CALCIUM 8.7* 8.1*   GFR: Estimated Creatinine Clearance: 61.2 mL/min (by C-G formula based on SCr of 0.98 mg/dL). Liver Function Tests: Recent Labs  Lab 05/07/19 1234 05/08/19 0324  AST 27 17  ALT 18 16  ALKPHOS 111 85  BILITOT 0.7 0.9  PROT 6.8 5.4*  ALBUMIN 4.0 3.1*   No results for input(s): LIPASE, AMYLASE in the last 168 hours. No results for input(s): AMMONIA in the last 168 hours. Coagulation Profile: Recent Labs  Lab 05/07/19 1234  INR 1.2   Cardiac Enzymes: No results for input(s): CKTOTAL, CKMB, CKMBINDEX, TROPONINI in the last 168 hours. BNP (last 3 results) No results for input(s): PROBNP in the last 8760 hours. HbA1C: No results for input(s): HGBA1C in the last 72 hours. CBG: Recent Labs  Lab 05/07/19 1224 05/08/19 0748 05/08/19 1258  GLUCAP 185* 173* 174*   Lipid Profile: Recent Labs    05/07/19 1422  TRIG 61   Thyroid Function Tests: Recent Labs    05/08/19 0324  TSH 1.702   Anemia Panel: Recent Labs    05/07/19 1422 05/08/19 0324  FERRITIN 95 115   Sepsis Labs: Recent Labs  Lab 05/07/19 1234 05/07/19 1422  PROCALCITON  --  <0.10  LATICACIDVEN 1.8 1.2    Recent Results (from the past 240 hour(s))  Culture, blood  (Routine x 2)     Status: None (Preliminary result)   Collection Time: 05/07/19 12:34 PM   Specimen: BLOOD LEFT ARM  Result Value Ref Range Status   Specimen Description   Final    BLOOD LEFT ARM Performed at [facility], [address] [place], [place] [address]    Special Requests   Final    BOTTLES DRAWN AEROBIC AND ANAEROBIC Blood Culture adequate volume Performed at [facility], [address] [place], [name] [address]    Culture   Final    NO GROWTH < 24 HOURS Performed at [facility], [place] [address] [place], [place] [address]    Report Status PENDING  Incomplete  Culture, blood (Routine x 2)  Status: None (Preliminary result)   Collection Time: 05/07/19 12:34 PM   Specimen: BLOOD RIGHT FOREARM  Result Value Ref Range Status   Specimen Description   Final    BLOOD RIGHT FOREARM Performed at [facility], [address] [place], [name] [address]    Special Requests   Final    BOTTLES DRAWN AEROBIC AND ANAEROBIC Blood Culture adequate volume Performed at [facility], [address] [place], [name] [address]    Culture   Final    NO GROWTH < 24 HOURS Performed at [facility], [place] [address] [place], [place] [address]    Report Status PENDING  Incomplete  SARS Coronavirus 2 Ag (30 min TAT) - Nasal Swab (BD Veritor Kit)     Status: Abnormal   Collection Time: 05/07/19  1:01 PM   Specimen: Nasal Swab (BD Veritor Kit)  Result Value Ref Range Status   SARS Coronavirus 2 Ag POSITIVE (Shavana Calder) NEGATIVE Final    Comment: RESULT CALLED TO, READ BACK BY AND VERIFIED WITH: SOPHIE  GOUGE @ 2993 ON 05/07/19, [email or web address].P (NOTE) SARS-CoV-2 antigen PRESENT. Positive results indicate the presence of viral antigens, but clinical correlation with patient history and other diagnostic information is necessary to determine patient infection status.  Positive results do not rule out bacterial infection or co-infection  with  other viruses. False positive results are rare but can occur, and confirmatory RT-PCR testing may be appropriate in some circumstances. The expected result is Negative. Fact Sheet for Patients: PodPark.tn Fact Sheet for Providers: GiftContent.is  This test is not yet approved or cleared by the [place] FDA and  has been authorized for detection and/or diagnosis of SARS-CoV-2 by FDA under an Emergency Use Authorization (EUA).  This EUA will remain in effect (meaning this test can be used) for the duration of  the COVID- 19 declaration under Section 564(b)(1) of the Act, 21 U.S.C. section 360bbb-3(b)(1), unless the authorization is terminated or revoked sooner. Performed at [facility], [address] [place], [name] [address]   MRSA PCR Screening     Status: None   Collection Time: 05/07/19 10:25 PM   Specimen: Nasopharyngeal  Result Value Ref Range Status   MRSA by PCR NEGATIVE NEGATIVE Final    Comment:        The GeneXpert MRSA Assay (FDA approved for NASAL specimens only), is one component of Dreyton Roessner comprehensive MRSA colonization surveillance program. It is not intended to diagnose MRSA infection nor to guide or monitor treatment for MRSA infections. Performed at [facility], [place] [address], [place], [place] [address]          Radiology Studies: Ct Head Wo Contrast  Result Date: 05/08/2019 CLINICAL DATA:  COVID, acute encephalopathy EXAM: CT HEAD WITHOUT CONTRAST TECHNIQUE: Contiguous axial images were obtained from the base of the skull through the vertex without intravenous contrast. COMPARISON:  04/21/2018 FINDINGS: Brain: No evidence of acute infarction, hemorrhage, hydrocephalus, extra-axial collection or mass lesion/mass effect. Mild cortical and central atrophy. Mild subcortical white matter and periventricular small vessel ischemic changes. Vascular: Intracranial atherosclerosis.  Skull: Normal. Negative for fracture or focal lesion. Sinuses/Orbits: Partial opacification the right ethmoid and left sphenoid sinuses. Near complete opacification the right sphenoid sinus. Mastoid air cells are clear. Other: None. IMPRESSION: No evidence of acute intracranial abnormality. Atrophy with small vessel ischemic changes. Electronically Signed   By: Julian Hy M.D.   On: 05/08/2019 10:07  Dg Chest Port 1 View  Result Date: 05/08/2019 CLINICAL DATA:  COVID-19 positive. EXAM: PORTABLE CHEST 1 VIEW COMPARISON:  May 07, 2019. FINDINGS: The heart size and mediastinal contours are within normal limits. No pneumothorax or pleural effusion is noted. Atherosclerosis of thoracic aorta is noted. Minimal left midlung and right basilar subsegmental atelectasis is noted. The visualized skeletal structures are unremarkable. IMPRESSION: Aortic atherosclerosis. Minimal left midlung and right basilar subsegmental atelectasis. No other abnormality seen in the chest. Electronically Signed   By: Marijo Conception M.D.   On: 05/08/2019 08:24   Dg Chest Portable 1 View  Result Date: 05/07/2019 CLINICAL DATA:  Fever and cough EXAM: PORTABLE CHEST 1 VIEW COMPARISON:  Chest radiograph dated 07/23/2017 FINDINGS: The heart size is normal accounting for technique. Vascular calcifications are seen in the aortic arch. Both lungs are clear. The visualized skeletal structures are unremarkable. IMPRESSION: No active disease. Electronically Signed   By: Zerita Boers M.D.   On: 05/07/2019 12:58        Scheduled Meds: . acetaminophen  650 mg Oral Once  . allopurinol  300 mg Oral QPM  . apixaban  5 mg Oral BID  . atorvastatin  20 mg Oral q1800  . brimonidine  1 drop Left Eye BID  . darifenacin  7.5 mg Oral Daily  . diltiazem  300 mg Oral Daily  . doxazosin  8 mg Oral Daily  . DULoxetine  60 mg Oral Q breakfast   And  . DULoxetine  30 mg Oral QAC supper  . fluticasone  2 spray Each Nare Daily  .  insulin aspart  0-9 Units Subcutaneous TID WC  . latanoprost  1 drop Both Eyes QHS  . levothyroxine  112 mcg Oral Daily  . magnesium oxide  400 mg Oral Daily  . pantoprazole  40 mg Oral Daily  . potassium chloride SA  20 mEq Oral Daily  . vitamin C  1,000 mg Oral Daily   Continuous Infusions:   LOS: 1 day    Time spent: over 30 min    Fayrene Helper, MD [facility] Pager AMION  If 7PM-7AM, please contact night-coverage [email or web address] Password [facility] 05/08/2019, 3:02 PM

## 2019-05-08 NOTE — H&P (Signed)
History and Physical    Jonathan [name] QJJ:[id number] DOB: [birth year] DOA: 05/07/2019  PCP: Binnie Rail, MD  Patient coming from: Home.  Chief Complaint: Confusion.  Most of the history was obtained from ER physician and accepting physician since patient's wife is unable to be reached at this time.  At the time of my exam patient is more alert and awake.  HPI: Jonathan [name] is a 85 y.o. male with history of A. fib, hypothyroidism, diabetes mellitus, COPD, previous history of subdural hematoma was brought to the ER at [facility] with having increasing confusion.  Patient's confusion started since waking up.  Per report patient did not have any fall or any weakness of the extremities.  Has been having some cough but no nausea vomiting or diarrhea chest pain or shortness of breath.  ED Course: In the ER patient is found to be mildly febrile with temperature 100.8 heart rate was around 154 in A. fib with RVR.  Patient was started on Cardizem infusion.  Covid test came back positive.  Patient inflammatory markers was showing ferritin of 95 LDH of 150 CRP 5.6 procalcitonin less than 0.10 chest x-ray was unremarkable.  Patient admitted for further management of acute encephalopathy with Covid infection and A. fib with RVR.  Review of Systems: As per HPI, rest all negative.   Past Medical History:  Diagnosis Date  . Atrial fibrillation (HCC)    a. paroxysmal, on Eliquis for anticoagulation  . BPH (benign prostatic hyperplasia)   . Bronchitis   . Cataract   . COPD (chronic obstructive pulmonary disease) (HCC)    2 liters O2 HS  . Depression   . Fatty tumor    waste and back  . Fibromyalgia   . GERD (gastroesophageal reflux disease)   . Glaucoma    bilateral eyes  . Gout   . Hereditary and idiopathic peripheral neuropathy 08/28/2015  . Hypertension   . Hypothyroidism   . Hypoxia   . Insomnia   . Osteoarthritis   . Osteoporosis   . Peripheral neuropathy   . Sleep  apnea    uses cpap-add oxygen at night  . Spinal compression fracture (HCC) seventh vertebre  . Transient alteration of awareness 08/28/2015  . Type 2 diabetes mellitus ([place]) 03/08/2016    Past Surgical History:  Procedure Laterality Date  . APPENDECTOMY  age 49  . CARPAL TUNNEL RELEASE Right    early 2000s  . CATARACT EXTRACTION     bilateral  . CHOLECYSTECTOMY  age 14  . EYE SURGERY    . FOOT ARTHRODESIS Right 02/02/2013   Procedure: RIGHT HALLUX METATARSAL PHALANGEAL JOINT ARTHRODESIS ;  Surgeon: Wylene Simmer, MD;  Location: [place];  Service: Orthopedics;  Laterality: Right;  . INGUINAL HERNIA REPAIR  age 89   rt side  . NASAL CONCHA BULLOSA RESECTION  age 5  . PROSTATE SURGERY    . SHOULDER ARTHROSCOPY W/ ROTATOR CUFF REPAIR Right    early 2000s  . tonsil    . VASECTOMY  age 68     reports that he quit smoking about 43 years ago. His smoking use included cigarettes. He has a 20.00 pack-year smoking history. He has never used smokeless tobacco. He reports current alcohol use. He reports that he does not use drugs.  Allergies  Allergen Reactions  . Other     DUST-Other reaction(s): Respiratory distress    Family History  Problem Relation Age of Onset  . Coronary  artery disease Brother   . Heart disease Father   . Lung cancer Father   . Kidney cancer Father   . Prostate cancer Father   . Arthritis Mother   . Lung cancer Mother   . Dementia Mother        Unspecified type, not Alzheimer's disease    Prior to Admission medications   Medication Sig Start Date End Date Taking? Authorizing Provider  acetaminophen (TYLENOL) 500 MG tablet Take 2 tablets (1,000 mg total) by mouth every 8 (eight) hours as needed. Patient taking differently: Take 1,000 mg by mouth 2 (two) times daily.  04/15/17   Binnie Rail, MD  acyclovir ointment (ZOVIRAX) 5 % Apply 1 application topically every 3 (three) hours. 02/09/19   Binnie Rail, MD  allopurinol (ZYLOPRIM) 300  MG tablet TAKE 1 TABLET BY MOUTH  EVERY EVENING 11/28/18   Burns, Claudina Lick, MD  apixaban (ELIQUIS) 5 MG TABS tablet Take 1 tablet (5 mg total) by mouth 2 (two) times daily. 11/11/18   Binnie Rail, MD  atorvastatin (LIPITOR) 20 MG tablet Take 1 tablet (20 mg total) by mouth daily at 6 PM. 08/10/18   Burns, Claudina Lick, MD  Biotin 5000 MCG TABS Take 5,000 mcg by mouth daily.    [provider]  blood glucose meter kit and supplies KIT Dispense based on patient and insurance preference. Use daily as directed. (FOR E11.9). 02/09/19   Binnie Rail, MD  brimonidine (ALPHAGAN) 0.15 % ophthalmic solution Place 1 drop into the left eye 2 (two) times daily.  12/06/14   [provider]  calcium-vitamin D (OSCAL WITH D) 500-200 MG-UNIT tablet Take 1 tablet by mouth daily with breakfast.    [provider]  colchicine (COLCRYS) 0.6 MG tablet Take 0.6 mg by mouth daily as needed (flareups).     [provider]  diltiazem (CARDIZEM CD) 300 MG 24 hr capsule TAKE 1 CAPSULE BY MOUTH  DAILY 04/04/19   Lelon Perla, MD  doxazosin (CARDURA) 8 MG tablet TAKE 1 TABLET BY MOUTH  DAILY. 03/15/19   Burns, Claudina Lick, MD  DULoxetine (CYMBALTA) 30 MG capsule TAKE 2 CAPSULES BY MOUTH IN THE MORNING AND 1 CAPSULE  IN THE EVENING 04/14/19   Burns, Claudina Lick, MD  glucose blood test strip Use to check blood sugar daily. E11.9 11/29/18   Binnie Rail, MD  hydrochlorothiazide (HYDRODIURIL) 25 MG tablet TAKE 1 TABLET BY MOUTH  DAILY 08/10/18   Lelon Perla, MD  Lancets MISC Use to check blood sugars daily. E11.9 11/29/18   Binnie Rail, MD  latanoprost (XALATAN) 0.005 % ophthalmic solution Place 1 drop into both eyes at bedtime. 05/02/12   Posey Boyer, MD  levalbuterol [facility] HFA) 45 MCG/ACT inhaler Inhale 1-2 puffs into the lungs every 4 (four) hours as needed for wheezing. 07/06/13   Collene Gobble, MD  levothyroxine (SYNTHROID) 112 MCG tablet TAKE 1 TABLET BY MOUTH EVERY DAY 03/16/19   Binnie Rail, MD  magnesium oxide (MAG-OX) 400 MG tablet Take 400 mg by mouth daily.    [provider]  metFORMIN (GLUCOPHAGE XR) 750 MG 24 hr tablet Take 1 tablet (750 mg total) by mouth daily with supper. 02/09/19   Burns, Claudina Lick, MD  mometasone (NASONEX) 50 MCG/ACT nasal spray Place 2 sprays into the nose daily. 02/15/18   Binnie Rail, MD  Multiple Vitamin (MULTIVITAMIN) tablet Take 1 tablet by mouth daily.  [provider]  nitroGLYCERIN (NITROSTAT) 0.4 MG SL tablet Place 1 tablet (0.4 mg total) under the tongue every 5 (five) minutes as needed for chest pain. 12/13/14   Barrett, Evelene Croon, PA-C  omeprazole (PRILOSEC) 20 MG capsule TAKE 1 CAPSULE BY MOUTH  DAILY 03/20/19   Burns, Claudina Lick, MD  oxyCODONE-acetaminophen (PERCOCET) 10-325 MG tablet Take 1 tablet by mouth every 8 (eight) hours as needed for pain.    [provider]  potassium chloride SA (K-DUR) 20 MEQ tablet Take 1 tablet (20 mEq total) by mouth daily. 02/15/19   Binnie Rail, MD  solifenacin (VESICARE) 5 MG tablet Take 5 mg by mouth daily. 03/21/19   [provider]  tadalafil (CIALIS) 20 MG tablet Take 1 tablet (20 mg total) by mouth daily as needed. For erectile dysfunction 12/08/16   Binnie Rail, MD  tolterodine (DETROL LA) 4 MG 24 hr capsule Take 4 mg by mouth daily.    [provider]  vitamin C (ASCORBIC ACID) 500 MG tablet Take 1,000 mg by mouth daily.     [provider]  VITAMIN D, CHOLECALCIFEROL, PO Take 50 mcg by mouth daily.    [provider]  zaleplon (SONATA) 10 MG capsule Take 1 capsule (10 mg total) by mouth at bedtime. 02/09/19   Binnie Rail, MD  zolpidem (AMBIEN) 10 MG tablet TAKE 1 TABLET (10 MG TOTAL) BY MOUTH AT BEDTIME AS NEEDED FOR SLEEP. 04/12/19   Binnie Rail, MD    Physical Exam: Constitutional: Moderately built and nourished. Vitals:   05/07/19 2222 05/07/19 2230 05/07/19 2328 05/08/19 0106  BP: (!) 175/64 (!) 167/69 (!) 148/50 (!)  169/67  Pulse: 81 77 73 88  Resp: 19 14 18 20   Temp: 99 F (37.2 C)  98.9 F (37.2 C)   TempSrc: Oral     SpO2: 95% 94% 94% 93%  Weight: 90.3 kg     Height: 5' 7"  (1.702 m)      Eyes: Anicteric no pallor. ENMT: No discharge from the ears eyes nose or mouth. Neck: No mass felt.  No neck rigidity. Respiratory: No rhonchi or crepitations. Cardiovascular: S1-S2 heard. Abdomen: Soft nontender bowel sounds present. Musculoskeletal: No edema. Skin: No rash. Neurologic: Alert awake oriented to time place and person.  Moves all extremities. Psychiatric: Appears normal with normal affect.   Labs on Admission: I have personally reviewed following labs and imaging studies  CBC: Recent Labs  Lab 05/07/19 1234  WBC 4.8  NEUTROABS 2.3  HGB 15.1  HCT 48.2  MCV 94.1  PLT 91*   Basic Metabolic Panel: Recent Labs  Lab 05/07/19 1234  NA 140  K 3.9  CL 104  CO2 24  GLUCOSE 184*  BUN 13  CREATININE 1.12  CALCIUM 8.7*   GFR: Estimated Creatinine Clearance: 53.6 mL/min (by C-G formula based on SCr of 1.12 mg/dL). Liver Function Tests: Recent Labs  Lab 05/07/19 1234  AST 27  ALT 18  ALKPHOS 111  BILITOT 0.7  PROT 6.8  ALBUMIN 4.0   No results for input(s): LIPASE, AMYLASE in the last 168 hours. No results for input(s): AMMONIA in the last 168 hours. Coagulation Profile: Recent Labs  Lab 05/07/19 1234  INR 1.2   Cardiac Enzymes: No results for input(s): CKTOTAL, CKMB, CKMBINDEX, TROPONINI in the last 168 hours. BNP (last 3 results) No results for input(s): PROBNP in the last 8760 hours. HbA1C: No results for input(s): HGBA1C in the last 72 hours.  CBG: Recent Labs  Lab 05/07/19 1224  GLUCAP 185*   Lipid Profile: Recent Labs    05/07/19 1422  TRIG 61   Thyroid Function Tests: No results for input(s): TSH, T4TOTAL, FREET4, T3FREE, THYROIDAB in the last 72 hours. Anemia Panel: Recent Labs    05/07/19 1422  FERRITIN 95   Urine analysis:    Component  Value Date/Time   COLORURINE YELLOW 12/22/2018 1543   APPEARANCEUR CLEAR 12/22/2018 1543   LABSPEC >=1.030 (A) 12/22/2018 1543   PHURINE 5.0 12/22/2018 1543   GLUCOSEU NEGATIVE 12/22/2018 1543   HGBUR NEGATIVE 12/22/2018 1543   BILIRUBINUR NEGATIVE 12/22/2018 1543   BILIRUBINUR neg 11/23/2012 0942   KETONESUR TRACE (A) 12/22/2018 1543   PROTEINUR 100 (A) 03/07/2016 2015   UROBILINOGEN 0.2 12/22/2018 1543   NITRITE NEGATIVE 12/22/2018 1543   LEUKOCYTESUR NEGATIVE 12/22/2018 1543   Sepsis Labs: @LABRCNTIP (procalcitonin:4,lacticidven:4) ) Recent Results (from the past 240 hour(s))  SARS Coronavirus 2 Ag (30 min TAT) - Nasal Swab (BD Veritor Kit)     Status: Abnormal   Collection Time: 05/07/19  1:01 PM   Specimen: Nasal Swab (BD Veritor Kit)  Result Value Ref Range Status   SARS Coronavirus 2 Ag POSITIVE (A) NEGATIVE Final    Comment: RESULT CALLED TO, READ BACK BY AND VERIFIED WITH: SOPHIE  GOUGE @ 4650 ON 05/07/19, [email or web address] (NOTE) SARS-CoV-2 antigen PRESENT. Positive results indicate the presence of viral antigens, but clinical correlation with patient history and other diagnostic information is necessary to determine patient infection status.  Positive results do not rule out bacterial infection or co-infection  with other viruses. False positive results are rare but can occur, and confirmatory RT-PCR testing may be appropriate in some circumstances. The expected result is Negative. Fact Sheet for Patients: PodPark.tn Fact Sheet for Providers: GiftContent.is  This test is not yet approved or cleared by the [place] FDA and  has been authorized for detection and/or diagnosis of SARS-CoV-2 by FDA under an Emergency Use Authorization (EUA).  This EUA will remain in effect (meaning this test can be used) for the duration of  the COVID- 19 declaration under Section 564(b)(1) of the Act, 21 U.S.C. section  360bbb-3(b)(1), unless the authorization is terminated or revoked sooner. Performed at [facility], [address] [place], [name] [address]   MRSA PCR Screening     Status: None   Collection Time: 05/07/19 10:25 PM   Specimen: Nasopharyngeal  Result Value Ref Range Status   MRSA by PCR NEGATIVE NEGATIVE Final    Comment:        The GeneXpert MRSA Assay (FDA approved for NASAL specimens only), is one component of a comprehensive MRSA colonization surveillance program. It is not intended to diagnose MRSA infection nor to guide or monitor treatment for MRSA infections. Performed at [facility], [place] [address], [place], [place] [address]      Radiological Exams on Admission: Dg Chest Portable 1 View  Result Date: 05/07/2019 CLINICAL DATA:  Fever and cough EXAM: PORTABLE CHEST 1 VIEW COMPARISON:  Chest radiograph dated 07/23/2017 FINDINGS: The heart size is normal accounting for technique. Vascular calcifications are seen in the aortic arch. Both lungs are clear. The visualized skeletal structures are unremarkable. IMPRESSION: No active disease. Electronically Signed   By: Zerita Boers M.D.   On: 05/07/2019 12:58    EKG: Independently reviewed.  A. fib with RVR.  Assessment/Plan Principal Problem:   Atrial fibrillation with rapid ventricular response (HCC) Active  Problems:   COPD (chronic obstructive pulmonary disease) (HCC)   Essential hypertension   Hypothyroidism   GERD (gastroesophageal reflux disease)   Type 2 diabetes mellitus (HCC)   Obesity (BMI 30.0-34.9)   Osteoarthritis   Chronic anticoagulation   Obstructive sleep apnea   COVID-19   Acute encephalopathy    1. Acute encephalopathy likely from Covid infection appears to be back at baseline at this time.  However since patient has had history of A. fib and also previous history of subdural hematoma will get a CT head.  Closely monitor. 2. COVID-19 infection presently not hypoxic and chest  x-ray does not show any definite infiltrates.  We will continue to monitor patient's respiratory status and inflammatory markers.  I have not started patient on Decadron or remdesivir. 3. A. fib with RVR improved by the time I examined.  Patient is on Cardizem orally which we will start.  Closely monitoring telemetry.  Check TSH cardiac markers.  On apixaban. 4. Diabetes mellitus type 2 we will keep patient on sliding scale coverage. 5. Hypothyroidism Synthroid. 6. History of COPD presently not wheezing continue inhalers.  Given that patient has acute encephalopathy from possibly COVID-19 infection with A. fib with RVR will need more than 2 midnight stay in inpatient status.   DVT prophylaxis: Apixaban. Code Status: Full code. Family Communication: Unable to reach patient's wife. Disposition Plan: Home when stable. Consults called: None. Admission status: Inpatient.   Rise Patience MD [facility] Pager [phone].  If 7PM-7AM, please contact night-coverage [email or web address] Password [email or web address]  05/08/2019, 3:13 AM

## 2019-05-08 NOTE — Discharge Instructions (Addendum)
COVID-19 COVID-19 is a respiratory infection that is caused by a virus called severe acute respiratory syndrome coronavirus 2 (SARS-CoV-2). The disease is also known as coronavirus disease or novel coronavirus. In some people, the virus may not cause any symptoms. In others, it may cause a serious infection. The infection can get worse quickly and can lead to complications, such as:  Pneumonia, or infection of the lungs.  Acute respiratory distress syndrome or ARDS. This is fluid build-up in the lungs.  Acute respiratory failure. This is a condition in which there is not enough oxygen passing from the lungs to the body.  Sepsis or septic shock. This is a serious bodily reaction to an infection.  Blood clotting problems.  Secondary infections due to bacteria or fungus. The virus that causes COVID-19 is contagious. This means that it can spread from person to person through droplets from coughs and sneezes (respiratory secretions). What are the causes? This illness is caused by a virus. You may catch the virus by:  Breathing in droplets from an infected person's cough or sneeze.  Touching something, like a table or a doorknob, that was exposed to the virus (contaminated) and then touching your mouth, nose, or eyes. What increases the risk? Risk for infection You are more likely to be infected with this virus if you:  Live in or travel to an area with a COVID-19 outbreak.  Come in contact with a sick person who recently traveled to an area with a COVID-19 outbreak.  Provide care for or live with a person who is infected with COVID-19. Risk for serious illness You are more likely to become seriously ill from the virus if you:  Are 66 years of age or older.  Have a long-term disease that lowers your body's ability to fight infection (immunocompromised).  Live in a nursing home or long-term care facility.  Have a long-term (chronic) disease such as: ? Chronic lung disease,  including chronic obstructive pulmonary disease or asthma ? Heart disease. ? Diabetes. ? Chronic kidney disease. ? Liver disease.  Are obese. What are the signs or symptoms? Symptoms of this condition can range from mild to severe. Symptoms may appear any time from 2 to 14 days after being exposed to the virus. They include:  A fever.  A cough.  Difficulty breathing.  Chills.  Muscle pains.  A sore throat.  Loss of taste or smell. Some people may also have stomach problems, such as nausea, vomiting, or diarrhea. Other people may not have any symptoms of COVID-19. How is this diagnosed? This condition may be diagnosed based on:  Your signs and symptoms, especially if: ? You live in an area with a COVID-19 outbreak. ? You recently traveled to or from an area where the virus is common. ? You provide care for or live with a person who was diagnosed with COVID-19.  A physical exam.  Lab tests, which may include: ? A nasal swab to take a sample of fluid from your nose. ? A throat swab to take a sample of fluid from your throat. ? A sample of mucus from your lungs (sputum). ? Blood tests.  Imaging tests, which may include, X-rays, CT scan, or ultrasound. How is this treated? At present, there is no medicine to treat COVID-19. Medicines that treat other diseases are being used on a trial basis to see if they are effective against COVID-19. Your health care provider will talk with you about ways to treat your symptoms. For  most people, the infection is mild and can be managed at home with rest, fluids, and over-the-counter medicines. Treatment for a serious infection usually takes places in a hospital intensive care unit (ICU). It may include one or more of the following treatments. These treatments are given until your symptoms improve.  Receiving fluids and medicines through an IV.  Supplemental oxygen. Extra oxygen is given through a tube in the nose, a face mask, or a  hood.  Positioning you to lie on your stomach (prone position). This makes it easier for oxygen to get into the lungs.  Continuous positive airway pressure (CPAP) or bi-level positive airway pressure (BPAP) machine. This treatment uses mild air pressure to keep the airways open. A tube that is connected to a motor delivers oxygen to the body.  Ventilator. This treatment moves air into and out of the lungs by using a tube that is placed in your windpipe.  Tracheostomy. This is a procedure to create a hole in the neck so that a breathing tube can be inserted.  Extracorporeal membrane oxygenation (ECMO). This procedure gives the lungs a chance to recover by taking over the functions of the heart and lungs. It supplies oxygen to the body and removes carbon dioxide. Follow these instructions at home: Lifestyle  If you are sick, stay home except to get medical care. Your health care provider will tell you how long to stay home. Call your health care provider before you go for medical care.  Rest at home as told by your health care provider.  Do not use any products that contain nicotine or tobacco, such as cigarettes, e-cigarettes, and chewing tobacco. If you need help quitting, ask your health care provider.  Return to your normal activities as told by your health care provider. Ask your health care provider what activities are safe for you. General instructions  Take over-the-counter and prescription medicines only as told by your health care provider.  Drink enough fluid to keep your urine pale yellow.  Keep all follow-up visits as told by your health care provider. This is important. How is this prevented?  There is no vaccine to help prevent COVID-19 infection. However, there are steps you can take to protect yourself and others from this virus. To protect yourself:   Do not travel to areas where COVID-19 is a risk. The areas where COVID-19 is reported change often. To identify  high-risk areas and travel restrictions, check the CDC travel website: FatFares.com.br  If you live in, or must travel to, an area where COVID-19 is a risk, take precautions to avoid infection. ? Stay away from people who are sick. ? Wash your hands often with soap and water for 20 seconds. If soap and water are not available, use an alcohol-based hand sanitizer. ? Avoid touching your mouth, face, eyes, or nose. ? Avoid going out in public, follow guidance from your state and local health authorities. ? If you must go out in public, wear a cloth face covering or face mask. ? Disinfect objects and surfaces that are frequently touched every day. This may include:  Counters and tables.  Doorknobs and light switches.  Sinks and faucets.  Electronics, such as phones, remote controls, keyboards, computers, and tablets. To protect others: If you have symptoms of COVID-19, take steps to prevent the virus from spreading to others.  If you think you have a COVID-19 infection, contact your health care provider right away. Tell your health care team that you think you  may have a COVID-19 infection.  Stay home. Leave your house only to seek medical care. Do not use public transport.  Do not travel while you are sick.  Wash your hands often with soap and water for 20 seconds. If soap and water are not available, use alcohol-based hand sanitizer.  Stay away from other members of your household. Let healthy household members care for children and pets, if possible. If you have to care for children or pets, wash your hands often and wear a mask. If possible, stay in your own room, separate from others. Use a different bathroom.  Make sure that all people in your household wash their hands well and often.  Cough or sneeze into a tissue or your sleeve or elbow. Do not cough or sneeze into your hand or into the air.  Wear a cloth face covering or face mask. Where to find more  information  Centers for Disease Control and Prevention: PurpleGadgets.be  World Health Organization: https://www.castaneda.info/ Contact a health care provider if:  You live in or have traveled to an area where COVID-19 is a risk and you have symptoms of the infection.  You have had contact with someone who has COVID-19 and you have symptoms of the infection. Get help right away if:  You have trouble breathing.  You have pain or pressure in your chest.  You have confusion.  You have bluish lips and fingernails.  You have difficulty waking from sleep.  You have symptoms that get worse. These symptoms may represent a serious problem that is an emergency. Do not wait to see if the symptoms will go away. Get medical help right away. Call your local emergency services (911 in the [place] Do not drive yourself to the hospital. Let the emergency medical personnel know if you think you have COVID-19. Summary  COVID-19 is a respiratory infection that is caused by a virus. It is also known as coronavirus disease or novel coronavirus. It can cause serious infections, such as pneumonia, acute respiratory distress syndrome, acute respiratory failure, or sepsis.  The virus that causes COVID-19 is contagious. This means that it can spread from person to person through droplets from coughs and sneezes.  You are more likely to develop a serious illness if you are 55 years of age or older, have a weak immunity, live in a nursing home, or have chronic disease.  There is no medicine to treat COVID-19. Your health care provider will talk with you about ways to treat your symptoms.  Take steps to protect yourself and others from infection. Wash your hands often and disinfect objects and surfaces that are frequently touched every day. Stay away from people who are sick and wear a mask if you are sick. This information is not intended to replace advice given to you by  your health care provider. Make sure you discuss any questions you have with your health care provider. Document Released: 07/07/2018 Document Revised: 10/27/2018 Document Reviewed: 07/07/2018 Elsevier Patient Education  2020 [place] on my medicine - ELIQUIS (apixaban)  Why was Eliquis prescribed for you? Eliquis was prescribed for you to reduce the risk of a blood clot forming that can cause a stroke if you have a medical condition called atrial fibrillation (a type of irregular heartbeat).  What do You need to know about Eliquis ? Take your Eliquis TWICE DAILY - one tablet in the morning and one tablet in the evening with or without food. If  you have difficulty swallowing the tablet whole please discuss with your pharmacist how to take the medication safely.  Take Eliquis exactly as prescribed by your doctor and DO NOT stop taking Eliquis without talking to the doctor who prescribed the medication.  Stopping may increase your risk of developing a stroke.  Refill your prescription before you run out.  After discharge, you should have regular check-up appointments with your healthcare provider that is prescribing your Eliquis.  In the future your dose may need to be changed if your kidney function or weight changes by a significant amount or as you get older.  What do you do if you miss a dose? If you miss a dose, take it as soon as you remember on the same day and resume taking twice daily.  Do not take more than one dose of ELIQUIS at the same time to make up a missed dose.  Important Safety Information A possible side effect of Eliquis is bleeding. You should call your healthcare provider right away if you experience any of the following: ? Bleeding from an injury or your nose that does not stop. ? Unusual colored urine (red or dark brown) or unusual colored stools (red or black). ? Unusual bruising for unknown reasons. ? A serious fall or if you hit your  head (even if there is no bleeding).  Some medicines may interact with Eliquis and might increase your risk of bleeding or clotting while on Eliquis. To help avoid this, consult your healthcare provider or pharmacist prior to using any new prescription or non-prescription medications, including herbals, vitamins, non-steroidal anti-inflammatory drugs (NSAIDs) and supplements.  This website has more information on Eliquis (apixaban): http://www.eliquis.com/eliquis/home

## 2019-05-08 NOTE — Progress Notes (Signed)
Pt arrived to unit via stretcher by [facility] . Pt assigned to room 2w18. Pt is A&OX3, breathing equal and unlabored. NAD noted. Pt denied pain and SOB. MAE well. Pt was places on progressive care monitor and oriented to room. VSS taken and POC reviewed with pt. NSR on monitor.

## 2019-05-08 NOTE — Telephone Encounter (Signed)
Copied from [place] [id number]. Topic: Referral - Status >> May 08, 2019  [id number] PM Simone Curia D wrote: [id number] Follow-up call. Left message on voicemail for patient to return my call regarding financial assistance for medications. Ambrose Mantle [phone]

## 2019-05-09 ENCOUNTER — Inpatient Hospital Stay (HOSPITAL_COMMUNITY): Payer: Medicare Other

## 2019-05-09 LAB — URINALYSIS, ROUTINE W REFLEX MICROSCOPIC
Bacteria, UA: NONE SEEN
Bilirubin Urine: NEGATIVE
Glucose, UA: NEGATIVE mg/dL
Hgb urine dipstick: NEGATIVE
Ketones, ur: 20 mg/dL — AB
Leukocytes,Ua: NEGATIVE
Nitrite: NEGATIVE
Protein, ur: 100 mg/dL — AB
Specific Gravity, Urine: 1.015 (ref 1.005–1.030)
pH: 5 (ref 5.0–8.0)

## 2019-05-09 LAB — CBC WITH DIFFERENTIAL/PLATELET
Abs Immature Granulocytes: 0.12 10*3/uL — ABNORMAL HIGH (ref 0.00–0.07)
Basophils Absolute: 0 10*3/uL (ref 0.0–0.1)
Basophils Relative: 0 %
Eosinophils Absolute: 0 10*3/uL (ref 0.0–0.5)
Eosinophils Relative: 0 %
HCT: 40 % (ref 39.0–52.0)
Hemoglobin: 13.4 g/dL (ref 13.0–17.0)
Immature Granulocytes: 3 %
Lymphocytes Relative: 25 %
Lymphs Abs: 1.2 10*3/uL (ref 0.7–4.0)
MCH: 30.2 pg (ref 26.0–34.0)
MCHC: 33.5 g/dL (ref 30.0–36.0)
MCV: 90.3 fL (ref 80.0–100.0)
Monocytes Absolute: 1.2 10*3/uL — ABNORMAL HIGH (ref 0.1–1.0)
Monocytes Relative: 26 %
Neutro Abs: 2.2 10*3/uL (ref 1.7–7.7)
Neutrophils Relative %: 46 %
Platelets: 86 10*3/uL — ABNORMAL LOW (ref 150–400)
RBC: 4.43 MIL/uL (ref 4.22–5.81)
RDW: 12.5 % (ref 11.5–15.5)
WBC: 4.7 10*3/uL (ref 4.0–10.5)
nRBC: 0 % (ref 0.0–0.2)

## 2019-05-09 LAB — COMPREHENSIVE METABOLIC PANEL
ALT: 14 U/L (ref 0–44)
AST: 16 U/L (ref 15–41)
Albumin: 3 g/dL — ABNORMAL LOW (ref 3.5–5.0)
Alkaline Phosphatase: 89 U/L (ref 38–126)
Anion gap: 12 (ref 5–15)
BUN: 9 mg/dL (ref 8–23)
CO2: 23 mmol/L (ref 22–32)
Calcium: 8.1 mg/dL — ABNORMAL LOW (ref 8.9–10.3)
Chloride: 102 mmol/L (ref 98–111)
Creatinine, Ser: 0.99 mg/dL (ref 0.61–1.24)
GFR calc Af Amer: >60 mL/min (ref >60)
GFR calc non Af Amer: >60 mL/min (ref >60)
Glucose, Bld: 148 mg/dL — ABNORMAL HIGH (ref 70–99)
Potassium: 3.2 mmol/L — ABNORMAL LOW (ref 3.5–5.1)
Sodium: 137 mmol/L (ref 135–145)
Total Bilirubin: 1 mg/dL (ref 0.3–1.2)
Total Protein: 5.5 g/dL — ABNORMAL LOW (ref 6.5–8.1)

## 2019-05-09 LAB — C-REACTIVE PROTEIN: CRP: 13.4 mg/dL — ABNORMAL HIGH (ref <1.0)

## 2019-05-09 LAB — GLUCOSE, CAPILLARY
Glucose-Capillary: 144 mg/dL — ABNORMAL HIGH (ref 70–99)
Glucose-Capillary: 151 mg/dL — ABNORMAL HIGH (ref 70–99)
Glucose-Capillary: 151 mg/dL — ABNORMAL HIGH (ref 70–99)
Glucose-Capillary: 142 mg/dL — ABNORMAL HIGH (ref 70–99)

## 2019-05-09 LAB — URINE CULTURE: Culture: <10000 — AB

## 2019-05-09 LAB — FERRITIN: Ferritin: 166 ng/mL (ref 24–336)

## 2019-05-09 LAB — MAGNESIUM: Magnesium: 1.9 mg/dL (ref 1.7–2.4)

## 2019-05-09 LAB — D-DIMER, QUANTITATIVE: D-Dimer, Quant: 0.57 ug/mL-FEU — ABNORMAL HIGH (ref 0.00–0.50)

## 2019-05-09 MED ORDER — SODIUM CHLORIDE 0.9 % IV SOLN
100.0000 mg | INTRAVENOUS | Status: AC
  Administered 2019-05-10 – 2019-05-13 (×4): 100 mg via INTRAVENOUS
  Filled 2019-05-09 (×4): qty 100

## 2019-05-09 MED ORDER — SODIUM CHLORIDE 0.9 % IV SOLN
200.0000 mg | Freq: Once | INTRAVENOUS | Status: AC
  Administered 2019-05-09: 200 mg via INTRAVENOUS
  Filled 2019-05-09: qty 40

## 2019-05-09 MED ORDER — PANTOPRAZOLE SODIUM 40 MG PO TBEC
40.0000 mg | DELAYED_RELEASE_TABLET | Freq: Every day | ORAL | Status: DC
  Administered 2019-05-10 – 2019-05-13 (×4): 40 mg via ORAL
  Filled 2019-05-09 (×4): qty 1

## 2019-05-09 MED ORDER — BENZONATATE 100 MG PO CAPS: 100.0000 mg | ORAL_CAPSULE | Freq: Three times a day (TID) | ORAL | Status: DC | PRN

## 2019-05-09 MED ORDER — POTASSIUM CHLORIDE CRYS ER 20 MEQ PO TBCR
40.0000 meq | EXTENDED_RELEASE_TABLET | ORAL | Status: AC
  Administered 2019-05-09 (×2): 40 meq via ORAL
  Filled 2019-05-09 (×2): qty 2

## 2019-05-09 NOTE — Evaluation (Signed)
Physical Therapy Evaluation Patient Details Name: Jonathan [name] MRN: [id number] DOB: 06-23-1932 Today's Date: 05/09/2019   History of Present Illness  85 y.o. male with history of A. fib, hypothyroidism, diabetes mellitus, COPD, previous history of subdural hematoma was brought to the ER at [facility] with having increasing confusion. found to be mildly febrile with temperature 100.8 heart rate was around 154 in A. fib with RVR. COVID+ transferred to [facility] 11/22.  Clinical Impression  PTA pt living with wife in 2 story home with steps to enter and bed and bath on first floor. Pt was independent in mobility and iADLs. Pt is supervision for safety with bed mobility and transfers and min guard for ambulation due to slight instability with gait. Pt will not need additional PT at discharge however PT will continue to follow acutely for strength and balance.     Follow Up Recommendations No PT follow up    Equipment Recommendations  None recommended by PT       Precautions / Restrictions Precautions Precautions: None Restrictions Weight Bearing Restrictions: No      Mobility  Bed Mobility Overal bed mobility: Needs Assistance Bed Mobility: Supine to Sit     Supine to sit: Supervision     General bed mobility comments: supervision for safety'  Transfers Overall transfer level: Needs assistance Equipment used: None Transfers: Sit to/from Stand Sit to Stand: Supervision         General transfer comment: supervision for safety  Ambulation/Gait Ambulation/Gait assistance: Min guard Gait Distance (Feet): 200 Feet(in room) Assistive device: None Gait Pattern/deviations: Step-through pattern;Decreased step length - right;Decreased step length - left;Drifts right/left Gait velocity: slowed Gait velocity interpretation: <1.8 ft/sec, indicate of risk for recurrent falls General Gait Details: min guard for safety, with mildly unsteady gait no overt LoB and  improved stability with progression   Stairs            Wheelchair Mobility    Modified Rankin (Stroke Patients Only)       Balance Overall balance assessment: Mild deficits observed, not formally tested                                           Pertinent Vitals/Pain Pain Assessment: No/denies pain    Home Living Family/patient expects to be discharged to:: Private residence Living Arrangements: Spouse/significant other Available Help at Discharge: Family;Available 24 hours/day Type of Home: House Home Access: Stairs to enter Entrance Stairs-Rails: Psychiatric nurse of Steps: 4 Home Layout: Two level;Able to live on main level with bedroom/bathroom Home Equipment: Grab bars - tub/shower;Hand held shower head;Walker - 2 wheels;Cane - single point;Shower seat - built in      Prior Function Level of Independence: Independent         Comments: drives     Hand Dominance   Dominant Hand: Right    Extremity/Trunk Assessment   Upper Extremity Assessment Upper Extremity Assessment: Defer to OT evaluation    Lower Extremity Assessment Lower Extremity Assessment: Overall WFL for tasks assessed    Cervical / Trunk Assessment Cervical / Trunk Assessment: Normal  Communication   Communication: No difficulties  Cognition Arousal/Alertness: Awake/alert Behavior During Therapy: WFL for tasks assessed/performed Overall Cognitive Status: Within Functional Limits for tasks assessed  General Comments: Stating it is Monday; Oriented to tuesday. Able to state year, place, and situation.      General Comments General comments (skin integrity, edema, etc.): SpO2 96-91% on RA. HR 90s. RR 20-30s        Assessment/Plan    PT Assessment Patient needs continued PT services  PT Problem List Decreased balance       PT Treatment Interventions Gait training;Stair training;Functional  mobility training;Therapeutic activities;Therapeutic exercise;Balance training;Cognitive remediation    PT Goals (Current goals can be found in the Care Plan section)  Acute Rehab PT Goals Patient Stated Goal: "I wish I could go home today" PT Goal Formulation: With patient Time For Goal Achievement: 05/23/19 Potential to Achieve Goals: Good    Frequency Min 3X/week   Barriers to discharge        Co-evaluation PT/OT/SLP Co-Evaluation/Treatment: Yes Reason for Co-Treatment: For patient/therapist safety PT goals addressed during session: Mobility/safety with mobility OT goals addressed during session: ADL's and self-care       AM-PAC PT "6 Clicks" Mobility  Outcome Measure Help needed turning from your back to your side while in a flat bed without using bedrails?: None Help needed moving from lying on your back to sitting on the side of a flat bed without using bedrails?: None Help needed moving to and from a bed to a chair (including a wheelchair)?: None Help needed standing up from a chair using your arms (e.g., wheelchair or bedside chair)?: None Help needed to walk in hospital room?: None Help needed climbing 3-5 steps with a railing? : A Little 6 Click Score: 23    End of Session Equipment Utilized During Treatment: Gait belt Activity Tolerance: Patient tolerated treatment well Patient left: in chair;with call bell/phone within reach;with chair alarm set Nurse Communication: Mobility status PT Visit Diagnosis: Other abnormalities of gait and mobility (R26.89)    Time: [id number] PT Time Calculation (min) (ACUTE ONLY): 27 min   Charges:   PT Evaluation $PT Eval Moderate Complexity: 1 Mod          Jonathan [name] B. Migdalia Dk PT, DPT Acute Rehabilitation Services Pager [phone] Office [phone]   [place] 05/09/2019, 1:42 PM

## 2019-05-09 NOTE — Evaluation (Signed)
Occupational Therapy Evaluation Patient Details Name: Jonathan [name] MRN: [id number] DOB: [birth year] Today's Date: 05/09/2019    History of Present Illness 85 y.o. male with history of A. fib, hypothyroidism, diabetes mellitus, COPD, previous history of subdural hematoma was brought to the ER at [facility] with having increasing confusion. found to be mildly febrile with temperature 100.8 heart rate was around 154 in A. fib with RVR. COVID+ transferred to [facility] 11/22.   Clinical Impression   PTA, pt was living with his wife and was independent with ADLs, IADLs, and driving. Pt currently performing ADLs and functional mobility at Supervision level. VSS throughout and SpO2 96-91% on RA. Pt would benefit from further acute OT to facilitate safe dc and provide education on [facility] for ADLs/IADLs. Recommend dc to home once medically stable per physician.     Follow Up Recommendations  No OT follow up    Equipment Recommendations  None recommended by OT    Recommendations for Other Services PT consult     Precautions / Restrictions Precautions Precautions: None Restrictions Weight Bearing Restrictions: No      Mobility Bed Mobility Overal bed mobility: Needs Assistance Bed Mobility: Supine to Sit     Supine to sit: Supervision     General bed mobility comments: Supervision for initital safety  Transfers Overall transfer level: Needs assistance Equipment used: None Transfers: Sit to/from Stand Sit to Stand: Supervision         General transfer comment: Supervision for safety    Balance Overall balance assessment: Mild deficits observed, not formally tested                                         ADL either performed or assessed with clinical judgement   ADL Overall ADL's : Needs assistance/impaired                                       General ADL Comments: Pt performing ADLs and functional mobility at Supervision  level. SpO2 96-92%. Performing five laps of mobility in room with supervision     Vision Baseline Vision/History: Wears glasses Wears Glasses: At all times Patient Visual Report: No change from baseline       Perception     Praxis      Pertinent Vitals/Pain Pain Assessment: No/denies pain     Hand Dominance Right   Extremity/Trunk Assessment Upper Extremity Assessment Upper Extremity Assessment: Overall WFL for tasks assessed   Lower Extremity Assessment Lower Extremity Assessment: Defer to PT evaluation   Cervical / Trunk Assessment Cervical / Trunk Assessment: Normal   Communication Communication Communication: No difficulties   Cognition Arousal/Alertness: Awake/alert Behavior During Therapy: WFL for tasks assessed/performed Overall Cognitive Status: Within Functional Limits for tasks assessed                                 General Comments: Stating it is Monday; Oriented to tuesday. Able to state year, place, and situation.   General Comments  SpO2 96-91% on RA. HR 90s. RR 20-30s    Exercises     Shoulder Instructions      Home Living Family/patient expects to be discharged to:: Private residence Living Arrangements: Spouse/significant other Available Help at Discharge:  Family;Available 24 hours/day Type of Home: House Home Access: Stairs to enter [employer] of Steps: 4 Entrance Stairs-Rails: Right;Left Home Layout: Two level;Able to live on main level with bedroom/bathroom     Bathroom Shower/Tub: Occupational psychologist: Standard(comfort height) Bathroom Accessibility: Yes   Home Equipment: Grab bars - tub/shower;Hand held Tourist information centre manager - 2 wheels;Cane - single point;Shower seat - built in          Prior Functioning/Environment Level of Independence: Independent        Comments: drives        OT Problem List: Decreased activity tolerance;Impaired balance (sitting and/or standing);Decreased  knowledge of use of DME or AE;Decreased knowledge of precautions      OT Treatment/Interventions: Self-care/ADL training;Therapeutic exercise;Energy conservation;DME and/or AE instruction;Therapeutic activities;Patient/family education    OT Goals(Current goals can be found in the care plan section) Acute Rehab OT Goals Patient Stated Goal: "I wish I could go home today" OT Goal Formulation: With patient Time For Goal Achievement: 05/23/19 Potential to Achieve Goals: Good  OT Frequency: Min 2X/week   Barriers to D/C:            Co-evaluation PT/OT/SLP Co-Evaluation/Treatment: Yes Reason for Co-Treatment: For patient/therapist safety;To address functional/ADL transfers   OT goals addressed during session: ADL's and self-care      AM-PAC OT "6 Clicks" Daily Activity     Outcome Measure Help from another person eating meals?: None Help from another person taking care of personal grooming?: None Help from another person toileting, which includes using toliet, bedpan, or urinal?: None Help from another person bathing (including washing, rinsing, drying)?: None Help from another person to put on and taking off regular upper body clothing?: None Help from another person to put on and taking off regular lower body clothing?: None 6 Click Score: 24   End of Session Equipment Utilized During Treatment: Gait belt Nurse Communication: Mobility status  Activity Tolerance: Patient tolerated treatment well Patient left: in chair;with call bell/phone within reach;with chair alarm set  OT Visit Diagnosis: Unsteadiness on feet (R26.81);Other abnormalities of gait and mobility (R26.89);Muscle weakness (generalized) (M62.81)                Time: [id number] OT Time Calculation (min): 26 min Charges:  OT General Charges $OT Visit: 1 Visit OT Evaluation $OT Eval Moderate Complexity: [place], OTR/L Acute Rehab Pager: [phone] Office: [place] 05/09/2019, 12:09 PM

## 2019-05-09 NOTE — Progress Notes (Addendum)
PROGRESS NOTE    Jonathan [name]  JGG:[id number] DOB: 01-28-1933 DOA: 05/07/2019 PCP: Binnie Rail, MD   Brief Narrative:  Jonathan [name] is Jonathan [name] 85 y.o. male with history of Jonathan [name]. fib, hypothyroidism, diabetes mellitus, COPD, previous history of subdural hematoma was brought to the ER at [facility] with having increasing confusion.  Patient's confusion started since waking up.  Per report patient did not have any fall or any weakness of the extremities.  Has been having some cough but no nausea vomiting or diarrhea chest pain or shortness of breath.  ED Course: In the ER patient is found to be mildly febrile with temperature 100.8 heart rate was around 154 in Jonathan [name]. fib with RVR.  Patient was started on Cardizem infusion.  Covid test came back positive.  Patient inflammatory markers was showing ferritin of 95 LDH of 150 CRP 5.6 procalcitonin less than 0.10 chest x-ray was unremarkable.  Patient admitted for further management of acute encephalopathy with Covid infection and Jonathan [name]. fib with RVR.  Assessment & Plan:   Principal Problem:   Atrial fibrillation with rapid ventricular response (HCC) Active Problems:   COPD (chronic obstructive pulmonary disease) (HCC)   Essential hypertension   Hypothyroidism   GERD (gastroesophageal reflux disease)   Type 2 diabetes mellitus (HCC)   Obesity (BMI 30.0-34.9)   Osteoarthritis   Chronic anticoagulation   Obstructive sleep apnea   COVID-19   Acute encephalopathy   1. Acute encephalopathy likely from Covid infection appears to be back at baseline at this time, though his step daughter and wife think he may still be Jonathan [name] little bit off his baseline.  1. Head CT without acute abnormality 2. Will continue to monitor, PT/OT evals 3. Blood cx NGTD 4. UA not concerning for UTI -> follow urine cx  2. COVID-19 infection with acute hypoxic respiratory failure 1. Overall seems to be doing well from respiratory standpoint, but while at bedside today, O2  sats <94 (~90-92).  Apparently maintained his O2 sats with ambulation with PT/OT 91-96% on RA.  Encouraged by his lack of subjective symptoms and lack of respiratory symptoms, but his mild hypoxia at rest in the setting of worsening CRP and CXR lead me to be Jonathan [name] bit more cautious with his discharge. 2. Repeat CXR intermittently 3. Will start remdesivir, hold off on steroids given lack of O2 requirement  4. Febrile at presentation to 100.8, since afebrile, but temps in 99's  5. Doing well on RA at this time, appears comfortable 6. Follow daily inflammatory markers - rising CRP, mildly elevated d dimer 7. I/O, daily weights 8. Incentive spirometer, OOBTC  COVID-19 Labs  Recent Labs    05/07/19 1422 05/08/19 0324 05/09/19 0447  DDIMER 0.34 0.47 0.57*  FERRITIN 95 115 166  LDH 150  --   --   CRP 5.6* 9.6* 13.4*    Lab Results  Component Value Date   SARSCOV2NAA Not Detected 12/23/2018   3. Jonathan [name]. fib with RVR - now sinus rhythm.  Continue diltiazem.   TSH wnl.  Continue eliquis.   4. Diabetes mellitus type 2 we will keep patient on sliding scale coverage.  On metformin at home.  BG's reasonable.  Follow.  5. Hypothyroidism Synthroid.  6. History of COPD presently not wheezing continue inhalers.  7. Gout: allopurinol  8. Glaucoma: continue eye drops  9. HLD: continue lipitor  10. HTN: continue doxazosin, HCTZ on hold  11. Overactive Bladder: enablex  12. BPH: continue doxazosin  13.  Insomnia: ambien  14. Thrombocytopenia: chronic, worse than previous values - possibly related to viral infection, follow in setting of anticoagulation  DVT prophylaxis: eliquis Code Status: full  Family Communication: none at bedside - Dr. Tarri Glenn, step daughter Disposition Plan: pending  Consultants:   none  Procedures:   none  Antimicrobials:  Anti-infectives (From admission, onward)   Start     Dose/Rate Route Frequency Ordered Stop   05/10/19 1400  remdesivir 100 mg in sodium  chloride 0.9 % 250 mL IVPB     100 mg 500 mL/hr over 30 Minutes Intravenous Every 24 hours 05/09/19 1304 05/14/19 1359   05/09/19 1400  remdesivir 200 mg in sodium chloride 0.9 % 250 mL IVPB     200 mg 500 mL/hr over 30 Minutes Intravenous Once 05/09/19 1304 05/09/19 1755         Subjective: Feeling progressively better Would like to go home Jonathan [name]&Ox3. Discussed with Dr. Tarri Glenn as well (stepdaughter)  Objective: Vitals:   05/09/19 1500 05/09/19 1622 05/09/19 1623 05/09/19 1700  BP: 131/61 (!) 137/54  (!) 137/55  Pulse: 83 82  79  Resp: (!) 22 (!) 21  18  Temp:  99.5 F (37.5 C) 98.8 F (37.1 C)   TempSrc:  Oral Oral   SpO2: 90% 93% 93% 93%  Weight:      Height:        Intake/Output Summary (Last 24 hours) at 05/09/2019 1856 Last data filed at 05/09/2019 1515 Gross per 24 hour  Intake 660 ml  Output 1150 ml  Net -490 ml   Filed Weights   05/07/19 2222 05/08/19 0500 05/09/19 0337  Weight: 90.3 kg 90.3 kg 90.5 kg    Examination:  General: No acute distress. Cardiovascular: RRR Lungs: unlabored, no adventitious lung sounds on auscultation with isolation scope Abdomen: Soft, nontender, nondistended. Neurological: Alert and oriented 3. Moves all extremities 4. Cranial nerves II through XII grossly intact. Skin: Warm and dry. No rashes or lesions. Extremities: No clubbing or cyanosis. No edema.   Data Reviewed: I have personally reviewed following labs and imaging studies  CBC: Recent Labs  Lab 05/07/19 1234 05/08/19 0324 05/09/19 0447  WBC 4.8 4.0 4.7  NEUTROABS 2.3 2.0 2.2  HGB 15.1 13.5 13.4  HCT 48.2 41.6 40.0  MCV 94.1 93.1 90.3  PLT 91* 83* 86*   Basic Metabolic Panel: Recent Labs  Lab 05/07/19 1234 05/08/19 0324 05/09/19 0447  NA 140 139 137  K 3.9 3.6 3.2*  CL 104 104 102  CO2 24 26 23   GLUCOSE 184* 153* 148*  BUN 13 9 9   CREATININE 1.12 0.98 0.99  CALCIUM 8.7* 8.1* 8.1*  MG  --   --  1.9   GFR: Estimated Creatinine Clearance: 60.7  mL/min (by C-G formula based on SCr of 0.99 mg/dL). Liver Function Tests: Recent Labs  Lab 05/07/19 1234 05/08/19 0324 05/09/19 0447  AST 27 17 16   ALT 18 16 14   ALKPHOS 111 85 89  BILITOT 0.7 0.9 1.0  PROT 6.8 5.4* 5.5*  ALBUMIN 4.0 3.1* 3.0*   No results for input(s): LIPASE, AMYLASE in the last 168 hours. No results for input(s): AMMONIA in the last 168 hours. Coagulation Profile: Recent Labs  Lab 05/07/19 1234  INR 1.2   Cardiac Enzymes: No results for input(s): CKTOTAL, CKMB, CKMBINDEX, TROPONINI in the last 168 hours. BNP (last 3 results) No results for input(s): PROBNP in the last 8760 hours. HbA1C: No results for input(s): HGBA1C in the last  72 hours. CBG: Recent Labs  Lab 05/08/19 1736 05/08/19 2126 05/09/19 0819 05/09/19 1129 05/09/19 1620  GLUCAP 119* 125* 144* 151* 151*   Lipid Profile: Recent Labs    05/07/19 1422  TRIG 61   Thyroid Function Tests: Recent Labs    05/08/19 0324  TSH 1.702   Anemia Panel: Recent Labs    05/08/19 0324 05/09/19 0447  FERRITIN 115 166   Sepsis Labs: Recent Labs  Lab 05/07/19 1234 05/07/19 1422  PROCALCITON  --  <0.10  LATICACIDVEN 1.8 1.2    Recent Results (from the past 240 hour(s))  Culture, blood (Routine x 2)     Status: None (Preliminary result)   Collection Time: 05/07/19 12:34 PM   Specimen: BLOOD LEFT ARM  Result Value Ref Range Status   Specimen Description   Final    BLOOD LEFT ARM Performed at [facility], [address] [place], [place] [address]    Special Requests   Final    BOTTLES DRAWN AEROBIC AND ANAEROBIC Blood Culture adequate volume Performed at [facility], [address] [place], [name] [address]    Culture   Final    NO GROWTH 2 DAYS Performed at [facility], [place] [address], [place], [place] [address]    Report Status PENDING  Incomplete  Culture, blood (Routine x 2)     Status: None (Preliminary result)   Collection Time: 05/07/19  12:34 PM   Specimen: BLOOD RIGHT FOREARM  Result Value Ref Range Status   Specimen Description   Final    BLOOD RIGHT FOREARM Performed at [facility], [address] [place], [name] [address]    Special Requests   Final    BOTTLES DRAWN AEROBIC AND ANAEROBIC Blood Culture adequate volume Performed at [facility], [address] [place], [name] [address]    Culture   Final    NO GROWTH 2 DAYS Performed at [facility], [place] [address], [place], [place] [address]    Report Status PENDING  Incomplete  SARS Coronavirus 2 Ag (30 min TAT) - Nasal Swab (BD Veritor Kit)     Status: Abnormal   Collection Time: 05/07/19  1:01 PM   Specimen: Nasal Swab (BD Veritor Kit)  Result Value Ref Range Status   SARS Coronavirus 2 Ag POSITIVE (Ginevra Tacker) NEGATIVE Final    Comment: RESULT CALLED TO, READ BACK BY AND VERIFIED WITH: SOPHIE  GOUGE @ 7741 ON 05/07/19, [email or web address] (NOTE) SARS-CoV-2 antigen PRESENT. Positive results indicate the presence of viral antigens, but clinical correlation with patient history and other diagnostic information is necessary to determine patient infection status.  Positive results do not rule out bacterial infection or co-infection  with other viruses. False positive results are rare but can occur, and confirmatory RT-PCR testing may be appropriate in some circumstances. The expected result is Negative. Fact Sheet for Patients: PodPark.tn Fact Sheet for Providers: GiftContent.is  This test is not yet approved or cleared by the [place] FDA and  has been authorized for detection and/or diagnosis of SARS-CoV-2 by FDA under an Emergency Use Authorization (EUA).  This EUA will remain in effect (meaning this test can be used) for the duration of  the COVID- 19 declaration under Section 564(b)(1) of the Act, 21 U.S.C. section 360bbb-3(b)(1), unless the authorization is terminated or  revoked sooner. Performed at [facility], [address], [place], [place] [address]  MRSA PCR Screening     Status: None   Collection Time: 05/07/19 10:25 PM   Specimen: Nasopharyngeal  Result Value Ref Range Status   MRSA by PCR NEGATIVE NEGATIVE Final    Comment:        The GeneXpert MRSA Assay (FDA approved for NASAL specimens only), is one component of Tanee Henery comprehensive MRSA colonization surveillance program. It is not intended to diagnose MRSA infection nor to guide or monitor treatment for MRSA infections. Performed at [facility], [place] [address], [place], [place] [address]          Radiology Studies: Ct Head Wo Contrast  Result Date: 05/08/2019 CLINICAL DATA:  COVID, acute encephalopathy EXAM: CT HEAD WITHOUT CONTRAST TECHNIQUE: Contiguous axial images were obtained from the base of the skull through the vertex without intravenous contrast. COMPARISON:  04/21/2018 FINDINGS: Brain: No evidence of acute infarction, hemorrhage, hydrocephalus, extra-axial collection or mass lesion/mass effect. Mild cortical and central atrophy. Mild subcortical white matter and periventricular small vessel ischemic changes. Vascular: Intracranial atherosclerosis. Skull: Normal. Negative for fracture or focal lesion. Sinuses/Orbits: Partial opacification the right ethmoid and left sphenoid sinuses. Near complete opacification the right sphenoid sinus. Mastoid air cells are clear. Other: None. IMPRESSION: No evidence of acute intracranial abnormality. Atrophy with small vessel ischemic changes. Electronically Signed   By: Julian Hy M.D.   On: 05/08/2019 10:07   Dg Chest Port 1 View  Result Date: 05/09/2019 CLINICAL DATA:  Fever and cough in Wisam Siefring patient who is COVID-19 positive. EXAM: PORTABLE CHEST 1 VIEW COMPARISON:  Single-view of the chest 05/08/2019 and 05/07/2019. FINDINGS: Hazy airspace opacities are seen in the left mid and lower lung zones. Right basilar  subsegmental atelectasis is noted. No pneumothorax or pleural fluid. Heart size is normal. Atherosclerosis is seen. IMPRESSION: Hazy airspace opacities in the left mid and lower lung zones worrisome for pneumonia including atypical/viral infection. Atherosclerosis. Electronically Signed   By: Inge Rise M.D.   On: 05/09/2019 09:21   Dg Chest Port 1 View  Result Date: 05/08/2019 CLINICAL DATA:  COVID-19 positive. EXAM: PORTABLE CHEST 1 VIEW COMPARISON:  May 07, 2019. FINDINGS: The heart size and mediastinal contours are within normal limits. No pneumothorax or pleural effusion is noted. Atherosclerosis of thoracic aorta is noted. Minimal left midlung and right basilar subsegmental atelectasis is noted. The visualized skeletal structures are unremarkable. IMPRESSION: Aortic atherosclerosis. Minimal left midlung and right basilar subsegmental atelectasis. No other abnormality seen in the chest. Electronically Signed   By: Marijo Conception M.D.   On: 05/08/2019 08:24        Scheduled Meds: . acetaminophen  650 mg Oral Once  . allopurinol  300 mg Oral QPM  . apixaban  5 mg Oral BID  . atorvastatin  20 mg Oral q1800  . brimonidine  1 drop Left Eye BID  . darifenacin  7.5 mg Oral Daily  . diltiazem  300 mg Oral Daily  . doxazosin  8 mg Oral Daily  . DULoxetine  60 mg Oral Q breakfast   And  . DULoxetine  30 mg Oral QAC supper  . fluticasone  2 spray Each Nare Daily  . insulin aspart  0-9 Units Subcutaneous TID WC  . latanoprost  1 drop Both Eyes QHS  . levothyroxine  112 mcg Oral Daily  . magnesium oxide  400 mg Oral Daily  . pantoprazole  40 mg Oral Daily  . potassium chloride SA  20 mEq Oral Daily  .  vitamin C  1,000 mg Oral Daily   Continuous Infusions: . [START ON 05/10/2019] remdesivir 100 mg in NS 250 mL       LOS: 2 days    Time spent: over 30 min    Fayrene Helper, MD [facility] Pager AMION  If 7PM-7AM, please contact night-coverage [email or web address]  Password [facility] 05/09/2019, 6:56 PM

## 2019-05-10 ENCOUNTER — Other Ambulatory Visit: Payer: Self-pay

## 2019-05-10 DIAGNOSIS — I10 Essential (primary) hypertension: Secondary | ICD-10-CM

## 2019-05-10 DIAGNOSIS — E119 Type 2 diabetes mellitus without complications: Secondary | ICD-10-CM

## 2019-05-10 DIAGNOSIS — Z7901 Long term (current) use of anticoagulants: Secondary | ICD-10-CM

## 2019-05-10 LAB — GLUCOSE, CAPILLARY
Glucose-Capillary: 139 mg/dL — ABNORMAL HIGH (ref 70–99)
Glucose-Capillary: 178 mg/dL — ABNORMAL HIGH (ref 70–99)
Glucose-Capillary: 142 mg/dL — ABNORMAL HIGH (ref 70–99)
Glucose-Capillary: 205 mg/dL — ABNORMAL HIGH (ref 70–99)

## 2019-05-10 LAB — CBC WITH DIFFERENTIAL/PLATELET
Abs Immature Granulocytes: 0.11 10*3/uL — ABNORMAL HIGH (ref 0.00–0.07)
Basophils Absolute: 0 10*3/uL (ref 0.0–0.1)
Basophils Relative: 0 %
Eosinophils Absolute: 0 10*3/uL (ref 0.0–0.5)
Eosinophils Relative: 0 %
HCT: 39.7 % (ref 39.0–52.0)
Hemoglobin: 13.2 g/dL (ref 13.0–17.0)
Immature Granulocytes: 2 %
Lymphocytes Relative: 26 %
Lymphs Abs: 1.2 10*3/uL (ref 0.7–4.0)
MCH: 30.1 pg (ref 26.0–34.0)
MCHC: 33.2 g/dL (ref 30.0–36.0)
MCV: 90.6 fL (ref 80.0–100.0)
Monocytes Absolute: 1.1 10*3/uL — ABNORMAL HIGH (ref 0.1–1.0)
Monocytes Relative: 24 %
Neutro Abs: 2.1 10*3/uL (ref 1.7–7.7)
Neutrophils Relative %: 48 %
Platelets: 102 10*3/uL — ABNORMAL LOW (ref 150–400)
RBC: 4.38 MIL/uL (ref 4.22–5.81)
RDW: 12.4 % (ref 11.5–15.5)
WBC: 4.5 10*3/uL (ref 4.0–10.5)
nRBC: 0 % (ref 0.0–0.2)

## 2019-05-10 LAB — COMPREHENSIVE METABOLIC PANEL
ALT: 13 U/L (ref 0–44)
AST: 16 U/L (ref 15–41)
Albumin: 3 g/dL — ABNORMAL LOW (ref 3.5–5.0)
Alkaline Phosphatase: 87 U/L (ref 38–126)
Anion gap: 12 (ref 5–15)
BUN: 12 mg/dL (ref 8–23)
CO2: 22 mmol/L (ref 22–32)
Calcium: 8.1 mg/dL — ABNORMAL LOW (ref 8.9–10.3)
Chloride: 104 mmol/L (ref 98–111)
Creatinine, Ser: 1.08 mg/dL (ref 0.61–1.24)
GFR calc Af Amer: >60 mL/min (ref >60)
GFR calc non Af Amer: >60 mL/min (ref >60)
Glucose, Bld: 156 mg/dL — ABNORMAL HIGH (ref 70–99)
Potassium: 3.4 mmol/L — ABNORMAL LOW (ref 3.5–5.1)
Sodium: 138 mmol/L (ref 135–145)
Total Bilirubin: 0.9 mg/dL (ref 0.3–1.2)
Total Protein: 5.6 g/dL — ABNORMAL LOW (ref 6.5–8.1)

## 2019-05-10 LAB — FERRITIN: Ferritin: 155 ng/mL (ref 24–336)

## 2019-05-10 LAB — C-REACTIVE PROTEIN: CRP: 12.8 mg/dL — ABNORMAL HIGH (ref <1.0)

## 2019-05-10 LAB — D-DIMER, QUANTITATIVE: D-Dimer, Quant: 0.79 ug/mL-FEU — ABNORMAL HIGH (ref 0.00–0.50)

## 2019-05-10 LAB — MAGNESIUM: Magnesium: 1.8 mg/dL (ref 1.7–2.4)

## 2019-05-10 MED ORDER — DULOXETINE HCL 60 MG PO CPEP
60.0000 mg | ORAL_CAPSULE | Freq: Every day | ORAL | Status: DC
  Administered 2019-05-10 – 2019-05-12 (×3): 60 mg via ORAL
  Filled 2019-05-10 (×4): qty 1

## 2019-05-10 MED ORDER — HYDROCOD POLST-CPM POLST ER 10-8 MG/5ML PO SUER
5.0000 mL | Freq: Two times a day (BID) | ORAL | Status: DC | PRN
  Administered 2019-05-10 – 2019-05-12 (×3): 5 mL via ORAL
  Filled 2019-05-10 (×3): qty 5

## 2019-05-10 MED ORDER — DULOXETINE HCL 60 MG PO CPEP
60.0000 mg | ORAL_CAPSULE | Freq: Every day | ORAL | Status: DC
  Administered 2019-05-11 – 2019-05-13 (×3): 60 mg via ORAL
  Filled 2019-05-10 (×5): qty 1

## 2019-05-10 MED ORDER — DEXAMETHASONE SODIUM PHOSPHATE 10 MG/ML IJ SOLN
10.0000 mg | INTRAMUSCULAR | Status: DC
  Administered 2019-05-10 – 2019-05-12 (×3): 10 mg via INTRAVENOUS
  Filled 2019-05-10 (×3): qty 1

## 2019-05-10 NOTE — Progress Notes (Signed)
Physical Therapy Treatment Patient Details Name: Jonathan [name] MRN: [id number] DOB: December 30, 1932 Today's Date: 05/10/2019    History of Present Illness 85 y.o. male with history of A. fib, hypothyroidism, diabetes mellitus, COPD, previous history of subdural hematoma was brought to the ER at [facility] with having increasing confusion. found to be mildly febrile with temperature 100.8 heart rate was around 154 in A. fib with RVR. COVID+ transferred [facility]/22.    PT Comments    Pt making good progress. Recommend return home when medically ready.    Follow Up Recommendations  No PT follow up     Equipment Recommendations  None recommended by PT    Recommendations for Other Services       Precautions / Restrictions Precautions Precautions: None Restrictions Weight Bearing Restrictions: No    Mobility  Bed Mobility               General bed mobility comments: Pt up in chair  Transfers Overall transfer level: Needs assistance Equipment used: None Transfers: Sit to/from Stand Sit to Stand: Supervision         General transfer comment: supervision for safety  Ambulation/Gait Ambulation/Gait assistance: Supervision;Min guard Gait Distance (Feet): 300 Feet Assistive device: None Gait Pattern/deviations: Step-through pattern;Decreased step length - right;Decreased step length - left;Drifts right/left Gait velocity: decr Gait velocity interpretation: 1.31 - 2.62 ft/sec, indicative of limited community ambulator General Gait Details: supervision for safety and pt with one slight loss of balance on quick turn but was able to regain balance with min guard.   Stairs             Wheelchair Mobility    Modified Rankin (Stroke Patients Only)       Balance Overall balance assessment: Mild deficits observed, not formally tested                                          Cognition Arousal/Alertness: Awake/alert Behavior  During Therapy: WFL for tasks assessed/performed Overall Cognitive Status: Within Functional Limits for tasks assessed                                        Exercises      General Comments General comments (skin integrity, edema, etc.): amb on RA with SpO2 >93%. Instructed pt to perform marching in place and long arc quads while seated throughout the day.       Pertinent Vitals/Pain Pain Assessment: No/denies pain    Home Living                      Prior Function            PT Goals (current goals can now be found in the care plan section) Progress towards PT goals: Progressing toward goals    Frequency    Min 3X/week      PT Plan Current plan remains appropriate    Co-evaluation              AM-PAC PT "6 Clicks" Mobility   Outcome Measure  Help needed turning from your back to your side while in a flat bed without using bedrails?: None Help needed moving from lying on your back to sitting on the side of a flat bed  without using bedrails?: None Help needed moving to and from a bed to a chair (including a wheelchair)?: None Help needed standing up from a chair using your arms (e.g., wheelchair or bedside chair)?: None Help needed to walk in hospital room?: None Help needed climbing 3-5 steps with a railing? : A Little 6 Click Score: 23    End of Session   Activity Tolerance: Patient tolerated treatment well Patient left: in chair;with call bell/phone within reach   PT Visit Diagnosis: Other abnormalities of gait and mobility (R26.89)     Time: [id number] PT Time Calculation (min) (ACUTE ONLY): 15 min  Charges:  $Gait Training: 8-22 mins                     [place] Pager [phone] Office [place] 05/10/2019, 11:41 AM

## 2019-05-10 NOTE — Progress Notes (Signed)
Pt o2 86-88% ra while asleep.  RN woke pt and placed 1L [place] & explained would try to wean off when awake. Pt awake on room air 98%.

## 2019-05-10 NOTE — Progress Notes (Signed)
[facility]  PROGRESS NOTE  Revan Gendron ASN:[id number] DOB: 1933-03-13 DOA: 05/07/2019 PCP: Binnie Rail, MD   Brief HPI:   85 year old male with a history of atrial fibrillation, hypothyroidism, diabetes mellitus, COPD, previous history of subdural hematoma was brought to the ER at [facility] with worsening confusion.  Patient did not have any focal weakness of the extremities.  He has been having cough but no nausea vomiting or diarrhea.  In the ED patient was found to be febrile with temperature 1.8, heart rate was 154 in A. fib with RVR, he was started on Cardizem drip.  Covid test came back positive.  Ferritin was 95, LDH 150, CRP 5.6, procalcitonin was less than 0.10, chest x-ray was unremarkable.  Patient was noted for further management of acute encephalopathy with COVID-19 infection and A. fib with RVR.    Subjective   Patient seen and examined, denies chest pain or shortness of breath.  He was started on remdesivir yesterday.   Assessment/Plan:     1. COVID-19 infection with acute hypoxic respiratory failure-stable, patient was mildly hypoxic yesterday with elevated CRP 13.4. Chest x-ray showed hazy airspace opacity in the left mid and lower lung zones worrisome for pneumonia including atypical/viral infection.  He was started on remdesivir yesterday.  Currently requiring 1 L/min of O2, oxygen saturation 98%.  CRP is 12.8.  D-dimer 0.79.  2. Acute encephalopathy-resolved likely from Covid and infection.  Patient is back to his baseline.  3. A. fib with RVR-currently back in normal sinus rhythm.  Continue Cardizem, Eliquis for anticoagulation.  4. Diabetes mellitus type 2-continue sliding scale insulin with NovoLog.  Blood glucose is fairly well controlled.  5. Hypothyroidism-continue Synthroid.  6. Gout-continue allopurinol.  7. Hyperlipidemia-continue Lipitor.  8. Hypertension-continue doxazosin, HCTZ is on hold.  9. Thrombocytopenia-platelet count  is improving.  102, 000 today.  Likely from underlying  COVID-19 infection.  No evidence of bleeding.        CBG: Recent Labs  Lab 05/09/19 1129 05/09/19 1620 05/09/19 2149 05/10/19 0847 05/10/19 1129  GLUCAP 151* 151* 142* 139* 178*    CBC: Recent Labs  Lab 05/07/19 1234 05/08/19 0324 05/09/19 0447 05/10/19 0344  WBC 4.8 4.0 4.7 4.5  NEUTROABS 2.3 2.0 2.2 2.1  HGB 15.1 13.5 13.4 13.2  HCT 48.2 41.6 40.0 39.7  MCV 94.1 93.1 90.3 90.6  PLT 91* 83* 86* 102*    Basic Metabolic Panel: Recent Labs  Lab 05/07/19 1234 05/08/19 0324 05/09/19 0447 05/10/19 0344  NA 140 139 137 138  K 3.9 3.6 3.2* 3.4*  CL 104 104 102 104  CO2 _0 GLUCOSE 184* 153* 148* 156*  BUN _1 CREATININE 1.12 0.98 0.99 1.08  CALCIUM 8.7* 8.1* 8.1* 8.1*  MG  --   --  1.9 1.8    COVID-19 Labs  Recent Labs    05/07/19 1422 05/08/19 0324 05/09/19 0447 05/10/19 0344  DDIMER 0.34 0.47 0.57* 0.79*  FERRITIN 95 115 166 155  LDH 150  --   --   --   CRP 5.6* 9.6* 13.4* 12.8*    Lab Results  Component Value Date   SARSCOV2NAA Not Detected 12/23/2018     DVT prophylaxis: Apixaban  Code Status: Full code  Family Communication: No family at bedside  Disposition Plan: likely home when medically ready for discharge     BMI  Estimated body mass index is 31.25 kg/m as calculated from the following:  Height as of this encounter: '5\' 7"'$  (1.702 m).   Weight as of this encounter: 90.5 kg.  Scheduled medications:  . acetaminophen  650 mg Oral Once  . allopurinol  300 mg Oral QPM  . apixaban  5 mg Oral BID  . atorvastatin  20 mg Oral q1800  . brimonidine  1 drop Left Eye BID  . darifenacin  7.5 mg Oral Daily  . diltiazem  300 mg Oral Daily  . doxazosin  8 mg Oral Daily  . DULoxetine  60 mg Oral Q breakfast   And  . DULoxetine  30 mg Oral QAC supper  . fluticasone  2 spray Each Nare Daily  . insulin aspart  0-9 Units Subcutaneous TID WC  . latanoprost  1  drop Both Eyes QHS  . levothyroxine  112 mcg Oral Daily  . magnesium oxide  400 mg Oral Daily  . pantoprazole  40 mg Oral Daily  . potassium chloride SA  20 mEq Oral Daily  . vitamin C  1,000 mg Oral Daily    Consultants:    Procedures:     Antibiotics:   Anti-infectives (From admission, onward)   Start     Dose/Rate Route Frequency Ordered Stop   05/10/19 1400  remdesivir 100 mg in sodium chloride 0.9 % 250 mL IVPB     100 mg 500 mL/hr over 30 Minutes Intravenous Every 24 hours 05/09/19 1304 05/14/19 1359   05/09/19 1400  remdesivir 200 mg in sodium chloride 0.9 % 250 mL IVPB     200 mg 500 mL/hr over 30 Minutes Intravenous Once 05/09/19 1304 05/09/19 1755       Objective   Vitals:   05/09/19 2153 05/10/19 0200 05/10/19 0832 05/10/19 0912  BP: 98/72 (!) 129/56 (!) 141/59   Pulse: 71  61   Resp: 20     Temp: 98.8 F (37.1 C)  98.6 F (37 C)   TempSrc:   Oral   SpO2: 92%  (S) 93% 98%  Weight:      Height:        Intake/Output Summary (Last 24 hours) at 05/10/2019 1401 Last data filed at 05/10/2019 0900 Gross per 24 hour  Intake 1040 ml  Output 565 ml  Net 475 ml   Filed Weights   05/07/19 2222 05/08/19 0500 05/09/19 0337  Weight: 90.3 kg 90.3 kg 90.5 kg     Physical Examination:    General: Appears in no acute distress  Cardiovascular: S1-S2, regular  Respiratory: Clear to auscultation bilaterally, no wheezing or crackles auscultated  Abdomen: Abdomen is soft, nontender, no organomegaly  Extremities: No edema in the lower extremities  Neurologic: Alert, oriented x3, intact insight and judgment, no focal deficit noted     Data Reviewed: I have personally reviewed following labs and imaging studies   Recent Results (from the past 240 hour(s))  Culture, blood (Routine x 2)     Status: None (Preliminary result)   Collection Time: 05/07/19 12:34 PM   Specimen: BLOOD LEFT ARM  Result Value Ref Range Status   Specimen Description   Final     BLOOD LEFT ARM Performed at [facility], [address] [place], [name] [address]    Special Requests   Final    BOTTLES DRAWN AEROBIC AND ANAEROBIC Blood Culture adequate volume Performed at [facility], [address] [place], [name] [address]    Culture   Final    NO GROWTH 3  DAYS Performed at [facility], [place] [address] [place], [place] [address]    Report Status PENDING  Incomplete  Culture, blood (Routine x 2)     Status: None (Preliminary result)   Collection Time: 05/07/19 12:34 PM   Specimen: BLOOD RIGHT FOREARM  Result Value Ref Range Status   Specimen Description   Final    BLOOD RIGHT FOREARM Performed at [facility], [address] [place], [name] [address]    Special Requests   Final    BOTTLES DRAWN AEROBIC AND ANAEROBIC Blood Culture adequate volume Performed at [facility], [address] [place], [name] [address]    Culture   Final    NO GROWTH 3 DAYS Performed at [facility], [place] [address] [place], [place] [address]    Report Status PENDING  Incomplete  SARS Coronavirus 2 Ag (30 min TAT) - Nasal Swab (BD Veritor Kit)     Status: Abnormal   Collection Time: 05/07/19  1:01 PM   Specimen: Nasal Swab (BD Veritor Kit)  Result Value Ref Range Status   SARS Coronavirus 2 Ag POSITIVE (A) NEGATIVE Final    Comment: RESULT CALLED TO, READ BACK BY AND VERIFIED WITH: SOPHIE  GOUGE @ 6789 ON 05/07/19, [email or web address] (NOTE) SARS-CoV-2 antigen PRESENT. Positive results indicate the presence of viral antigens, but clinical correlation with patient history and other diagnostic information is necessary to determine patient infection status.  Positive results do not rule out bacterial infection or co-infection  with other viruses. False positive results are rare but can occur, and confirmatory RT-PCR testing may be appropriate in some circumstances. The expected result is Negative. Fact Sheet for  Patients: PodPark.tn Fact Sheet for Providers: GiftContent.is  This test is not yet approved or cleared by the [place] FDA and  has been authorized for detection and/or diagnosis of SARS-CoV-2 by FDA under an Emergency Use Authorization (EUA).  This EUA will remain in effect (meaning this test can be used) for the duration of  the COVID- 19 declaration under Section 564(b)(1) of the Act, 21 U.S.C. section 360bbb-3(b)(1), unless the authorization is terminated or revoked sooner. Performed at [facility], [address] [place], [name] [address]   MRSA PCR Screening     Status: None   Collection Time: 05/07/19 10:25 PM   Specimen: Nasopharyngeal  Result Value Ref Range Status   MRSA by PCR NEGATIVE NEGATIVE Final    Comment:        The GeneXpert MRSA Assay (FDA approved for NASAL specimens only), is one component of a comprehensive MRSA colonization surveillance program. It is not intended to diagnose MRSA infection nor to guide or monitor treatment for MRSA infections. Performed at [facility], [place] [address], [place], [place] [address]   Culture, Urine     Status: Abnormal   Collection Time: 05/09/19  1:19 AM   Specimen: Urine, Clean Catch  Result Value Ref Range Status   Specimen Description URINE, CLEAN CATCH  Final   Special Requests NONE  Final   Culture (A)  Final    <10,000 COLONIES/mL INSIGNIFICANT GROWTH Performed at [facility], [place] [address], [place], [place] [address]    Report Status 05/09/2019 FINAL  Final     Liver Function Tests: Recent Labs  Lab 05/07/19 1234 05/08/19 0324 05/09/19 0447 05/10/19 0344  AST _0 ALT _1 ALKPHOS  111 85 89 87  BILITOT 0.7 0.9 1.0 0.9  PROT 6.8 5.4* 5.5* 5.6*  ALBUMIN 4.0 3.1* 3.0* 3.0*   No results for input(s): LIPASE, AMYLASE in the last 168 hours. No results for input(s): AMMONIA in the last 168  hours.  Cardiac Enzymes: No results for input(s): CKTOTAL, CKMB, CKMBINDEX, TROPONINI in the last 168 hours. BNP (last 3 results) Recent Labs    05/08/19 0324  BNP 90.8    ProBNP (last 3 results) No results for input(s): PROBNP in the last 8760 hours.    Studies: Dg Chest Port 1 View  Result Date: 05/09/2019 CLINICAL DATA:  Fever and cough in a patient who is COVID-19 positive. EXAM: PORTABLE CHEST 1 VIEW COMPARISON:  Single-view of the chest 05/08/2019 and 05/07/2019. FINDINGS: Hazy airspace opacities are seen in the left mid and lower lung zones. Right basilar subsegmental atelectasis is noted. No pneumothorax or pleural fluid. Heart size is normal. Atherosclerosis is seen. IMPRESSION: Hazy airspace opacities in the left mid and lower lung zones worrisome for pneumonia including atypical/viral infection. Atherosclerosis. Electronically Signed   By: Inge Rise M.D.   On: 05/09/2019 09:21     Admission status: Inpatient: Based on patients clinical presentation and evaluation of above clinical data, I have made determination that patient meets Inpatient criteria at this time.   [facility] Pager [phone]. If 7PM-7AM, please contact night-coverage at [email or web address], Office  [phone]  password [place]  05/10/2019, 2:01 PM  LOS: 3 days

## 2019-05-10 NOTE — Progress Notes (Signed)
Pt arrived at [facility] room 106 via EMS transport. Pt able to ambulate to bed from stretcher without issue. Pt placed on monitor, VSS, pt on RA, pt has no complaints. Assessment completed. Belongings accounted for, pt has iphone and apple watch on his person and cloths and bagged at bedside. Wallet and contents bagged and sent to security.

## 2019-05-10 NOTE — Progress Notes (Signed)
Occupational Therapy Treatment Patient Details Name: Jonathan [name] MRN: [id number] DOB: [birth year] Today's Date: 05/10/2019    History of present illness 85 y.o. male with history of A. fib, hypothyroidism, diabetes mellitus, COPD, previous history of subdural hematoma was brought to the ER at [facility] with having increasing confusion. found to be mildly febrile with temperature 100.8 heart rate was around 154 in A. fib with RVR. COVID+ transferred to [facility] 11/22.   OT comments  Pt progressing towards established OT goals. Provided pt with handouts and education on energy conservation and fall prevention. Pt verbalized understanding and discussed ways to implement into routine. Pt getting dressed, brushing his hair at sink, and performing mobility around room with Supervision. Denies fatigue or SOB. Continue to recommend dc to home once medically stable and will continue to follow acutely as admitted.    Follow Up Recommendations  No OT follow up    Equipment Recommendations  None recommended by OT    Recommendations for Other Services PT consult    Precautions / Restrictions Precautions Precautions: None       Mobility Bed Mobility               General bed mobility comments: Sitting at EOB  Transfers Overall transfer level: Needs assistance Equipment used: None Transfers: Sit to/from Stand Sit to Stand: Supervision         General transfer comment: supervision for safety    Balance Overall balance assessment: Mild deficits observed, not formally tested                                         ADL either performed or assessed with clinical judgement   ADL Overall ADL's : Needs assistance/impaired                                       General ADL Comments: Getting dressing, brushing his hair at sink, and performing functional mobility in room with supervision.     Vision       Perception     Praxis       Cognition Arousal/Alertness: Awake/alert Behavior During Therapy: WFL for tasks assessed/performed Overall Cognitive Status: Within Functional Limits for tasks assessed                                          Exercises Exercises: Other exercises Other Exercises Other Exercises: Provided pt with EC handout and reviewing in full. Pt discussing ways he already impliments EC and new techniques to put into his routine. Other Exercises: Providing pt with handout on fall prevention and pt verablized understanding   Shoulder Instructions       General Comments VSS on RA    Pertinent Vitals/ Pain       Pain Assessment: No/denies pain  Home Living                                          Prior Functioning/Environment              Frequency  Min 2X/week  Progress Toward Goals  OT Goals(current goals can now be found in the care plan section)  Progress towards OT goals: Progressing toward goals  Acute Rehab OT Goals Patient Stated Goal: "I wish I could go home today" OT Goal Formulation: With patient Time For Goal Achievement: 05/23/19 Potential to Achieve Goals: Good ADL Goals Pt Will Perform Grooming: with modified independence;standing Pt Will Perform Lower Body Dressing: with modified independence;sit to/from stand Pt Will Transfer to Toilet: with modified independence;ambulating;regular height toilet Pt Will Perform Toileting - Clothing Manipulation and hygiene: with modified independence;sitting/lateral leans;sit to/from stand Additional ADL Goal #1: Pt will independently verablize three energy conservation techniques for ADLs and IADLs  Plan Discharge plan remains appropriate    Co-evaluation                 AM-PAC OT "6 Clicks" Daily Activity     Outcome Measure   Help from another person eating meals?: None Help from another person taking care of personal grooming?: None Help from another person  toileting, which includes using toliet, bedpan, or urinal?: None Help from another person bathing (including washing, rinsing, drying)?: None Help from another person to put on and taking off regular upper body clothing?: None Help from another person to put on and taking off regular lower body clothing?: None 6 Click Score: 24    End of Session    OT Visit Diagnosis: Unsteadiness on feet (R26.81);Other abnormalities of gait and mobility (R26.89);Muscle weakness (generalized) (M62.81)   Activity Tolerance Patient tolerated treatment well   Patient Left with call bell/phone within reach;in chair;with nursing/sitter in room   Nurse Communication Mobility status        Time: [id number] OT Time Calculation (min): 32 min  Charges: OT General Charges $OT Visit: 1 Visit OT Treatments $Self Care/Home Management : 23-37 mins  [place], OTR/L Acute Rehab Pager: [phone] Office: [place] 05/10/2019, 4:17 PM

## 2019-05-11 ENCOUNTER — Encounter: Payer: Self-pay | Admitting: Internal Medicine

## 2019-05-11 DIAGNOSIS — J1289 Other viral pneumonia: Secondary | ICD-10-CM

## 2019-05-11 DIAGNOSIS — E039 Hypothyroidism, unspecified: Secondary | ICD-10-CM

## 2019-05-11 DIAGNOSIS — E1165 Type 2 diabetes mellitus with hyperglycemia: Secondary | ICD-10-CM

## 2019-05-11 LAB — CBC
HCT: 43.1 % (ref 39.0–52.0)
Hemoglobin: 14.2 g/dL (ref 13.0–17.0)
MCH: 29.8 pg (ref 26.0–34.0)
MCHC: 32.9 g/dL (ref 30.0–36.0)
MCV: 90.5 fL (ref 80.0–100.0)
Platelets: 128 10*3/uL — ABNORMAL LOW (ref 150–400)
RBC: 4.76 MIL/uL (ref 4.22–5.81)
RDW: 12.4 % (ref 11.5–15.5)
WBC: 2 10*3/uL — ABNORMAL LOW (ref 4.0–10.5)
nRBC: 0 % (ref 0.0–0.2)

## 2019-05-11 LAB — COMPREHENSIVE METABOLIC PANEL
ALT: 16 U/L (ref 0–44)
AST: 15 U/L (ref 15–41)
Albumin: 3.5 g/dL (ref 3.5–5.0)
Alkaline Phosphatase: 103 U/L (ref 38–126)
Anion gap: 10 (ref 5–15)
BUN: 20 mg/dL (ref 8–23)
CO2: 21 mmol/L — ABNORMAL LOW (ref 22–32)
Calcium: 8.5 mg/dL — ABNORMAL LOW (ref 8.9–10.3)
Chloride: 109 mmol/L (ref 98–111)
Creatinine, Ser: 0.95 mg/dL (ref 0.61–1.24)
GFR calc Af Amer: >60 mL/min (ref >60)
GFR calc non Af Amer: >60 mL/min (ref >60)
Glucose, Bld: 266 mg/dL — ABNORMAL HIGH (ref 70–99)
Potassium: 3.8 mmol/L (ref 3.5–5.1)
Sodium: 140 mmol/L (ref 135–145)
Total Bilirubin: 0.4 mg/dL (ref 0.3–1.2)
Total Protein: 6.5 g/dL (ref 6.5–8.1)

## 2019-05-11 LAB — GLUCOSE, CAPILLARY
Glucose-Capillary: 270 mg/dL — ABNORMAL HIGH (ref 70–99)
Glucose-Capillary: 289 mg/dL — ABNORMAL HIGH (ref 70–99)
Glucose-Capillary: 369 mg/dL — ABNORMAL HIGH (ref 70–99)
Glucose-Capillary: 226 mg/dL — ABNORMAL HIGH (ref 70–99)

## 2019-05-11 LAB — C-REACTIVE PROTEIN: CRP: 9.4 mg/dL — ABNORMAL HIGH (ref <1.0)

## 2019-05-11 LAB — D-DIMER, QUANTITATIVE: D-Dimer, Quant: 0.69 ug/mL-FEU — ABNORMAL HIGH (ref 0.00–0.50)

## 2019-05-11 NOTE — Progress Notes (Signed)
PROGRESS NOTE  Jonathan [name] ELF:[id number] DOB: [birth year] DOA: 05/07/2019  PCP: Binnie Rail, MD  Brief History/Interval Summary: 85 year old male with a history of atrial fibrillation, hypothyroidism, diabetes mellitus, COPD, previous history of subdural hematoma was brought to the ER at [facility] with worsening confusion.  Patient did not have any focal weakness of the extremities.  He has been having cough but no nausea vomiting or diarrhea.  In the ED patient was found to be febrile with temperature 1.8, heart rate was 154 in A. fib with RVR, he was started on Cardizem drip.  Covid test came back positive.  Ferritin was 95, LDH 150, CRP 5.6, procalcitonin was less than 0.10, chest x-ray was unremarkable.  Patient was noted for further management of acute encephalopathy with COVID-19 infection and A. fib with RVR.  Reason for Visit: No new due to COVID-19.  Paroxysmal atrial fibrillation.  Consultants: None  Procedures: None  Antibiotics: Anti-infectives (From admission, onward)   Start     Dose/Rate Route Frequency Ordered Stop   05/10/19 1400  remdesivir 100 mg in sodium chloride 0.9 % 250 mL IVPB     100 mg 500 mL/hr over 30 Minutes Intravenous Every 24 hours 05/09/19 1304 05/14/19 0959   05/09/19 1400  remdesivir 200 mg in sodium chloride 0.9 % 250 mL IVPB     200 mg 500 mL/hr over 30 Minutes Intravenous Once 05/09/19 1304 05/09/19 1755      Subjective/Interval History: Patient states that he is feeling better.  Not as confused as he was during the earlier part of this admission.  Denies any chest pain.  Occasional shortness of breath.  Occasional cough.  No nausea vomiting.   Assessment/Plan:  Pneumonia due to COVID-19 Patient was initially mildly hypoxic which appears to have resolved.  He saturating normal on room air.  Chest x-ray did show airspace opacity in the left lung.  Patient was started on remdesivir.  Today will be day 3.  He also remains on  steroids.  From a respiratory standpoint he is stable.  CRP was 13.42 days ago.  9.4 today.  Incentive spirometry was provided to the patient.  Mobilization.  Prone positioning as much as tolerated.  Since patient has improved he is not a candidate for Actemra or convalescent plasma at this time.  D-dimer 0.69.  COVID-19 Labs  Recent Labs    05/09/19 0447 05/10/19 0344 05/11/19 0250  DDIMER 0.57* 0.79* 0.69*  FERRITIN 166 155  --   CRP 13.4* 12.8* 9.4*    COVID-19 positive test result from 05/07/2019 noted in the EMR.  Acute metabolic encephalopathy Most likely multifactorial including Covid infection.  Seems to be back to his baseline now.  No neurological deficits noted.  Paroxysmal atrial fibrillation with RVR Has a known history of atrial fibrillation.  You have he is on Eliquis for anticoagulation.  He was in RVR initially.  Now in sinus rhythm.  Diabetes mellitus type 2, uncontrolled with hyperglycemia Elevated CBGs most likely due to steroids.  Continue SSI.  Consider long-acting insulin.  Patient on Metformin at home.  HbA1c was 7.4 in August.  History of hypothyroidism Continue levothyroxine.  History of gout Continue allopurinol  Hyperlipidemia Continue Lipitor  Essential hypertension Blood pressure reasonably well controlled.  Occasional high readings noted.  Continue to monitor.  Mild thrombocytopenia Platelet counts improving.  Continue to monitor.  Leukopenia Secondary to COVID-19.  Obesity Estimated body mass index is 31.25 kg/m as calculated from the  following:   Height as of this encounter: 5' 7"  (1.702 m).   Weight as of this encounter: 90.5 kg.   DVT Prophylaxis: On Eliquis Code Status: Full code Family Communication: Discussed with the patient.  Will call his family as well. Disposition Plan: Continue to mobilize.  Anticipate discharge home when he has completed 5 days of remdesivir.   Medications:  Scheduled:  acetaminophen  650 mg Oral  Once   allopurinol  300 mg Oral QPM   apixaban  5 mg Oral BID   atorvastatin  20 mg Oral q1800   brimonidine  1 drop Left Eye BID   darifenacin  7.5 mg Oral Daily   dexamethasone (DECADRON) injection  10 mg Intravenous Q24H   diltiazem  300 mg Oral Daily   doxazosin  8 mg Oral Daily   DULoxetine  60 mg Oral QAC supper   And   DULoxetine  60 mg Oral Q breakfast   fluticasone  2 spray Each Nare Daily   insulin aspart  0-9 Units Subcutaneous TID WC   latanoprost  1 drop Both Eyes QHS   levothyroxine  112 mcg Oral Daily   magnesium oxide  400 mg Oral Daily   pantoprazole  40 mg Oral Daily   potassium chloride SA  20 mEq Oral Daily   vitamin C  1,000 mg Oral Daily   Continuous:  remdesivir 100 mg in NS 250 mL 100 mg (05/11/19 1022)   FXO:VANVBTYOMAYOK **OR** acetaminophen, chlorpheniramine-HYDROcodone, levalbuterol, nitroGLYCERIN, ondansetron **OR** ondansetron (ZOFRAN) IV, oxyCODONE-acetaminophen **AND** oxyCODONE, zolpidem   Objective:  Vital Signs  Vitals:   05/10/19 1600 05/10/19 1920 05/11/19 0400 05/11/19 0751  BP: (!) 145/72   134/86  Pulse: 77 70 82 82  Resp: 20 17 20 15   Temp: 98.2 F (36.8 C)   97.6 F (36.4 C)  TempSrc: Oral   Oral  SpO2: 97% 99% 94% 97%  Weight:      Height:        Intake/Output Summary (Last 24 hours) at 05/11/2019 1037 Last data filed at 05/11/2019 0937 Gross per 24 hour  Intake 970 ml  Output 700 ml  Net 270 ml   Filed Weights   05/07/19 2222 05/08/19 0500 05/09/19 0337  Weight: 90.3 kg 90.3 kg 90.5 kg    General appearance: Awake alert.  In no distress Resp: Mildly tachypneic at rest.  Coarse breath sounds bilaterally.  Few crackles at the bases.  No wheezing or rhonchi. Cardio: S1-S2 is normal regular.  No S3-S4.  No rubs murmurs or bruit GI: Abdomen is soft.  Nontender nondistended.  Bowel sounds are present normal.  No masses organomegaly Extremities: No edema.  Full range of motion of lower  extremities. Neurologic: Alert and oriented x3.  No focal neurological deficits.    Lab Results:  Data Reviewed: I have personally reviewed following labs and imaging studies  CBC: Recent Labs  Lab 05/07/19 1234 05/08/19 0324 05/09/19 0447 05/10/19 0344 05/11/19 0250  WBC 4.8 4.0 4.7 4.5 2.0*  NEUTROABS 2.3 2.0 2.2 2.1  --   HGB 15.1 13.5 13.4 13.2 14.2  HCT 48.2 41.6 40.0 39.7 43.1  MCV 94.1 93.1 90.3 90.6 90.5  PLT 91* 83* 86* 102* 128*    Basic Metabolic Panel: Recent Labs  Lab 05/07/19 1234 05/08/19 0324 05/09/19 0447 05/10/19 0344 05/11/19 0250  NA 140 139 137 138 140  K 3.9 3.6 3.2* 3.4* 3.8  CL 104 104 102 104 109  CO2 24 26 23  22 21*  GLUCOSE 184* 153* 148* 156* 266*  BUN 13 9 9 12 20   CREATININE 1.12 0.98 0.99 1.08 0.95  CALCIUM 8.7* 8.1* 8.1* 8.1* 8.5*  MG  --   --  1.9 1.8  --     GFR: Estimated Creatinine Clearance: 63.3 mL/min (by C-G formula based on SCr of 0.95 mg/dL).  Liver Function Tests: Recent Labs  Lab 05/07/19 1234 05/08/19 0324 05/09/19 0447 05/10/19 0344 05/11/19 0250  AST 27 17 16 16 15   ALT 18 16 14 13 16   ALKPHOS 111 85 89 87 103  BILITOT 0.7 0.9 1.0 0.9 0.4  PROT 6.8 5.4* 5.5* 5.6* 6.5  ALBUMIN 4.0 3.1* 3.0* 3.0* 3.5    Coagulation Profile: Recent Labs  Lab 05/07/19 1234  INR 1.2    CBG: Recent Labs  Lab 05/10/19 0847 05/10/19 1129 05/10/19 1705 05/10/19 2003 05/11/19 0749  GLUCAP 139* 178* 142* 205* 270*    Anemia Panel: Recent Labs    05/09/19 0447 05/10/19 0344  FERRITIN 166 155    Recent Results (from the past 240 hour(s))  Culture, blood (Routine x 2)     Status: None (Preliminary result)   Collection Time: 05/07/19 12:34 PM   Specimen: BLOOD LEFT ARM  Result Value Ref Range Status   Specimen Description   Final    BLOOD LEFT ARM Performed at [facility], [address] [place], [place] [address]    Special Requests   Final    BOTTLES DRAWN AEROBIC AND ANAEROBIC Blood  Culture adequate volume Performed at [facility], [address] [place], [name] [address]    Culture   Final    NO GROWTH 3 DAYS Performed at [facility], [place] [address] [place], [place] [address]    Report Status PENDING  Incomplete  Culture, blood (Routine x 2)     Status: None (Preliminary result)   Collection Time: 05/07/19 12:34 PM   Specimen: BLOOD RIGHT FOREARM  Result Value Ref Range Status   Specimen Description   Final    BLOOD RIGHT FOREARM Performed at [facility], [address] [place], [name] [address]    Special Requests   Final    BOTTLES DRAWN AEROBIC AND ANAEROBIC Blood Culture adequate volume Performed at [facility], [address] [place], [name] [address]    Culture   Final    NO GROWTH 3 DAYS Performed at [facility], [place] [address] [place], [place] [address]    Report Status PENDING  Incomplete  SARS Coronavirus 2 Ag (30 min TAT) - Nasal Swab (BD Veritor Kit)     Status: Abnormal   Collection Time: 05/07/19  1:01 PM   Specimen: Nasal Swab (BD Veritor Kit)  Result Value Ref Range Status   SARS Coronavirus 2 Ag POSITIVE (A) NEGATIVE Final    Comment: RESULT CALLED TO, READ BACK BY AND VERIFIED WITH: SOPHIE  GOUGE @ 9983 ON 05/07/19, [email or web address] (NOTE) SARS-CoV-2 antigen PRESENT. Positive results indicate the presence of viral antigens, but clinical correlation with patient history and other diagnostic information is necessary to determine patient infection status.  Positive results do not rule out bacterial infection or co-infection  with other viruses. False positive results are rare but can occur, and confirmatory RT-PCR testing may be appropriate in some circumstances. The expected result is Negative. Fact Sheet for Patients: PodPark.tn Fact Sheet for Providers: GiftContent.is  This test  is not yet approved or cleared by the [place] and  has been authorized for detection and/or diagnosis of SARS-CoV-2 by FDA under an Emergency Use Authorization (EUA).  This EUA will remain in effect (meaning this test can be used) for the duration of  the COVID- 19 declaration under Section 564(b)(1) of the Act, 21 U.S.C. section 360bbb-3(b)(1), unless the authorization is terminated or revoked sooner. Performed at [facility], [address] [place], [name] [address]   MRSA PCR Screening     Status: None   Collection Time: 05/07/19 10:25 PM   Specimen: Nasopharyngeal  Result Value Ref Range Status   MRSA by PCR NEGATIVE NEGATIVE Final    Comment:        The GeneXpert MRSA Assay (FDA approved for NASAL specimens only), is one component of a comprehensive MRSA colonization surveillance program. It is not intended to diagnose MRSA infection nor to guide or monitor treatment for MRSA infections. Performed at [facility], [place] [address] [place], [place] [address]   Culture, Urine     Status: Abnormal   Collection Time: 05/09/19  1:19 AM   Specimen: Urine, Clean Catch  Result Value Ref Range Status   Specimen Description URINE, CLEAN CATCH  Final   Special Requests NONE  Final   Culture (A)  Final    <10,000 COLONIES/mL INSIGNIFICANT GROWTH Performed at [facility], [place] [address] [place], [place] [address]    Report Status 05/09/2019 FINAL  Final      Radiology Studies: No results found.     LOS: 4 days   Jonathan [name] [employer] on [email or web address]  05/11/2019, 10:37 AM

## 2019-05-12 DIAGNOSIS — I480 Paroxysmal atrial fibrillation: Secondary | ICD-10-CM

## 2019-05-12 LAB — COMPREHENSIVE METABOLIC PANEL
ALT: 12 U/L (ref 0–44)
AST: 11 U/L — ABNORMAL LOW (ref 15–41)
Albumin: 3 g/dL — ABNORMAL LOW (ref 3.5–5.0)
Alkaline Phosphatase: 87 U/L (ref 38–126)
Anion gap: 7 (ref 5–15)
BUN: 25 mg/dL — ABNORMAL HIGH (ref 8–23)
CO2: 22 mmol/L (ref 22–32)
Calcium: 8.6 mg/dL — ABNORMAL LOW (ref 8.9–10.3)
Chloride: 111 mmol/L (ref 98–111)
Creatinine, Ser: 0.86 mg/dL (ref 0.61–1.24)
GFR calc Af Amer: >60 mL/min (ref >60)
GFR calc non Af Amer: >60 mL/min (ref >60)
Glucose, Bld: 252 mg/dL — ABNORMAL HIGH (ref 70–99)
Potassium: 4.1 mmol/L (ref 3.5–5.1)
Sodium: 140 mmol/L (ref 135–145)
Total Bilirubin: 0.4 mg/dL (ref 0.3–1.2)
Total Protein: 5.7 g/dL — ABNORMAL LOW (ref 6.5–8.1)

## 2019-05-12 LAB — CBC
HCT: 37.4 % — ABNORMAL LOW (ref 39.0–52.0)
Hemoglobin: 12.4 g/dL — ABNORMAL LOW (ref 13.0–17.0)
MCH: 30 pg (ref 26.0–34.0)
MCHC: 33.2 g/dL (ref 30.0–36.0)
MCV: 90.6 fL (ref 80.0–100.0)
Platelets: 144 10*3/uL — ABNORMAL LOW (ref 150–400)
RBC: 4.13 MIL/uL — ABNORMAL LOW (ref 4.22–5.81)
RDW: 12.4 % (ref 11.5–15.5)
WBC: 7.8 10*3/uL (ref 4.0–10.5)
nRBC: 0 % (ref 0.0–0.2)

## 2019-05-12 LAB — CULTURE, BLOOD (ROUTINE X 2)
Culture: NO GROWTH
Culture: NO GROWTH
Special Requests: ADEQUATE
Special Requests: ADEQUATE

## 2019-05-12 LAB — D-DIMER, QUANTITATIVE: D-Dimer, Quant: 0.54 ug/mL-FEU — ABNORMAL HIGH (ref 0.00–0.50)

## 2019-05-12 LAB — GLUCOSE, CAPILLARY
Glucose-Capillary: 227 mg/dL — ABNORMAL HIGH (ref 70–99)
Glucose-Capillary: 313 mg/dL — ABNORMAL HIGH (ref 70–99)
Glucose-Capillary: 343 mg/dL — ABNORMAL HIGH (ref 70–99)

## 2019-05-12 LAB — C-REACTIVE PROTEIN: CRP: 4 mg/dL — ABNORMAL HIGH (ref <1.0)

## 2019-05-12 MED ORDER — INSULIN GLARGINE 100 UNIT/ML ~~LOC~~ SOLN
8.0000 [IU] | Freq: Every day | SUBCUTANEOUS | Status: DC
  Administered 2019-05-12 – 2019-05-13 (×2): 8 [IU] via SUBCUTANEOUS
  Filled 2019-05-12 (×2): qty 0.08

## 2019-05-12 NOTE — Progress Notes (Signed)
PROGRESS NOTE  Jonathan [name] KCL:[id number] DOB: 05-16-1933 DOA: 05/07/2019  PCP: Binnie Rail, MD  Brief History/Interval Summary: 85 year old male with a history of atrial fibrillation, hypothyroidism, diabetes mellitus, COPD, previous history of subdural hematoma was brought to the ER at [facility] with worsening confusion.  Patient did not have any focal weakness of the extremities.  He has been having cough but no nausea vomiting or diarrhea.  In the ED patient was found to be febrile with temperature 1.8, heart rate was 154 in A. fib with RVR, he was started on Cardizem drip.  Covid test came back positive.  Ferritin was 95, LDH 150, CRP 5.6, procalcitonin was less than 0.10, chest x-ray was unremarkable.  Patient was noted for further management of acute encephalopathy with COVID-19 infection and A. fib with RVR.  Reason for Visit: No new due to COVID-19.  Paroxysmal atrial fibrillation.  Consultants: None  Procedures: None  Antibiotics: Anti-infectives (From admission, onward)   Start     Dose/Rate Route Frequency Ordered Stop   05/10/19 1400  remdesivir 100 mg in sodium chloride 0.9 % 250 mL IVPB     100 mg 500 mL/hr over 30 Minutes Intravenous Every 24 hours 05/09/19 1304 05/14/19 0959   05/09/19 1400  remdesivir 200 mg in sodium chloride 0.9 % 250 mL IVPB     200 mg 500 mL/hr over 30 Minutes Intravenous Once 05/09/19 1304 05/09/19 1755      Subjective/Interval History: Patient states that he is feeling much better.  Was able to ambulate in the hallway yesterday.  Denies any cough.  No chest pain.  Tolerating his diet.     Assessment/Plan:  Pneumonia due to COVID-19 Patient was initially mildly hypoxic which appears to have resolved.  Continues to saturate normal on room air.  Able to ambulate.  Patient was started on remdesivir.  His x-ray did show opacity in the left lung.  He will complete 5 days of treatment tomorrow.  He remains on steroids.  From a  respiratory standpoint he is improved.  CRP was 13.4 at admission.  Has improved to 4.0 today.  Continue mobilization prone positioning incentive spirometry.  D-dimer 0.54.    COVID-19 Labs  Recent Labs    05/10/19 0344 05/11/19 0250 05/12/19 0240  DDIMER 0.79* 0.69* 0.54*  FERRITIN 155  --   --   CRP 12.8* 9.4* 4.0*    COVID-19 positive test result from 05/07/2019 noted in the EMR.  Acute metabolic encephalopathy Most likely multifactorial including Covid infection.  Seems to be back to his baseline now.    Paroxysmal atrial fibrillation with RVR Has a known history of atrial fibrillation.  Continue Eliquis for anticoagulation.  He was initially in RVR at admission.  Now in sinus rhythm.    Diabetes mellitus type 2, uncontrolled with hyperglycemia Elevated CBGs most likely due to steroids.  Continue SSI.  Start low-dose Lantus.  Patient on Metformin at home.  HbA1c was 7.4 in August.    History of hypothyroidism Continue levothyroxine.  History of gout Continue allopurinol  Hyperlipidemia Continue Lipitor  Essential hypertension Blood pressure is reasonably well controlled.  Continue to monitor.    Mild thrombocytopenia Platelet counts improving.  Continue to monitor.  Leukopenia Secondary to COVID-19.  Now resolved.  Obesity Estimated body mass index is 31.25 kg/m as calculated from the following:   Height as of this encounter: 5' 7"  (1.702 m).   Weight as of this encounter: 90.5 kg.  DVT Prophylaxis: On Eliquis Code Status: Full code Family Communication: Discussed with patient.  His stepdaughter was updated yesterday. Disposition Plan: Continue to mobilize.  Anticipate discharge tomorrow.   Medications:  Scheduled: . allopurinol  300 mg Oral QPM  . apixaban  5 mg Oral BID  . atorvastatin  20 mg Oral q1800  . brimonidine  1 drop Left Eye BID  . darifenacin  7.5 mg Oral Daily  . dexamethasone (DECADRON) injection  10 mg Intravenous Q24H  . diltiazem   300 mg Oral Daily  . doxazosin  8 mg Oral Daily  . DULoxetine  60 mg Oral QAC supper   And  . DULoxetine  60 mg Oral Q breakfast  . fluticasone  2 spray Each Nare Daily  . insulin aspart  0-9 Units Subcutaneous TID WC  . latanoprost  1 drop Both Eyes QHS  . levothyroxine  112 mcg Oral Daily  . magnesium oxide  400 mg Oral Daily  . pantoprazole  40 mg Oral Daily  . potassium chloride SA  20 mEq Oral Daily  . vitamin C  1,000 mg Oral Daily   Continuous: . remdesivir 100 mg in NS 250 mL 100 mg (05/12/19 1037)   [id number] **OR** acetaminophen, chlorpheniramine-HYDROcodone, levalbuterol, nitroGLYCERIN, ondansetron **OR** ondansetron (ZOFRAN) IV, oxyCODONE-acetaminophen **AND** oxyCODONE, zolpidem   Objective:  Vital Signs  Vitals:   05/11/19 1915 05/11/19 1933 05/12/19 0530 05/12/19 0747  BP:  130/77 137/66 133/66  Pulse: 71 77 (!) 51 76  Resp: 15 15  16   Temp:  97.8 F (36.6 C)  97.6 F (36.4 C)  TempSrc:  Oral  Oral  SpO2: 98% 96% 91% 99%  Weight:      Height:        Intake/Output Summary (Last 24 hours) at 05/12/2019 1220 Last data filed at 05/12/2019 0942 Gross per 24 hour  Intake 240 ml  Output -  Net 240 ml   Filed Weights   05/07/19 2222 05/08/19 0500 05/09/19 0337  Weight: 90.3 kg 90.3 kg 90.5 kg    General appearance: Awake alert.  In no distress Resp: Normal effort at rest.  Coarse breath sound bilaterally.  Few crackles at the bases.  No wheezing or rhonchi. Cardio: S1-S2 is normal regular.  No S3-S4.  No rubs murmurs or bruit GI: Abdomen is soft.  Nontender nondistended.  Bowel sounds are present normal.  No masses organomegaly Extremities: No edema.  Full range of motion of lower extremities. Neurologic: Alert and oriented x3.  No focal neurological deficits.    Lab Results:  Data Reviewed: I have personally reviewed following labs and imaging studies  CBC: Recent Labs  Lab 05/07/19 1234 05/08/19 0324 05/09/19 0447 05/10/19 0344  05/11/19 0250 05/12/19 0240  WBC 4.8 4.0 4.7 4.5 2.0* 7.8  NEUTROABS 2.3 2.0 2.2 2.1  --   --   HGB 15.1 13.5 13.4 13.2 14.2 12.4*  HCT 48.2 41.6 40.0 39.7 43.1 37.4*  MCV 94.1 93.1 90.3 90.6 90.5 90.6  PLT 91* 83* 86* 102* 128* 144*    Basic Metabolic Panel: Recent Labs  Lab 05/08/19 0324 05/09/19 0447 05/10/19 0344 05/11/19 0250 05/12/19 0240  NA 139 137 138 140 140  K 3.6 3.2* 3.4* 3.8 4.1  CL 104 102 104 109 111  CO2 26 23 22  21* 22  GLUCOSE 153* 148* 156* 266* 252*  BUN 9 9 12 20  25*  CREATININE 0.98 0.99 1.08 0.95 0.86  CALCIUM 8.1* 8.1* 8.1* 8.5* 8.6*  MG  --  1.9 1.8  --   --     GFR: Estimated Creatinine Clearance: 69.9 mL/min (by C-G formula based on SCr of 0.86 mg/dL).  Liver Function Tests: Recent Labs  Lab 05/08/19 0324 05/09/19 0447 05/10/19 0344 05/11/19 0250 05/12/19 0240  AST 17 16 16 15  11*  ALT 16 14 13 16 12   ALKPHOS 85 89 87 103 87  BILITOT 0.9 1.0 0.9 0.4 0.4  PROT 5.4* 5.5* 5.6* 6.5 5.7*  ALBUMIN 3.1* 3.0* 3.0* 3.5 3.0*    Coagulation Profile: Recent Labs  Lab 05/07/19 1234  INR 1.2    CBG: Recent Labs  Lab 05/11/19 0749 05/11/19 1139 05/11/19 1634 05/11/19 2101 05/12/19 0750  GLUCAP 270* 289* 369* 226* 227*    Anemia Panel: Recent Labs    05/10/19 0344  FERRITIN 155    Recent Results (from the past 240 hour(s))  Culture, blood (Routine x 2)     Status: None   Collection Time: 05/07/19 12:34 PM   Specimen: BLOOD LEFT ARM  Result Value Ref Range Status   Specimen Description   Final    BLOOD LEFT ARM Performed at [facility], [address] [place], [place] [address]    Special Requests   Final    BOTTLES DRAWN AEROBIC AND ANAEROBIC Blood Culture adequate volume Performed at [facility], [address] [place], [name] [address]    Culture   Final    NO GROWTH 5 DAYS Performed at [facility], [place] [address] [place], [place] [address]    Report Status 05/12/2019 FINAL   Final  Culture, blood (Routine x 2)     Status: None   Collection Time: 05/07/19 12:34 PM   Specimen: BLOOD RIGHT FOREARM  Result Value Ref Range Status   Specimen Description   Final    BLOOD RIGHT FOREARM Performed at [facility], [address] [place], [name] [address]    Special Requests   Final    BOTTLES DRAWN AEROBIC AND ANAEROBIC Blood Culture adequate volume Performed at [facility], [address] [place], [name] [address]    Culture   Final    NO GROWTH 5 DAYS Performed at [facility], [place] [address], [place], [place] [address]    Report Status 05/12/2019 FINAL  Final  SARS Coronavirus 2 Ag (30 min TAT) - Nasal Swab (BD Veritor Kit)     Status: Abnormal   Collection Time: 05/07/19  1:01 PM   Specimen: Nasal Swab (BD Veritor Kit)  Result Value Ref Range Status   SARS Coronavirus 2 Ag POSITIVE (A) NEGATIVE Final    Comment: RESULT CALLED TO, READ BACK BY AND VERIFIED WITH: SOPHIE  GOUGE @ 3419 ON 05/07/19, [email or web address] (NOTE) SARS-CoV-2 antigen PRESENT. Positive results indicate the presence of viral antigens, but clinical correlation with patient history and other diagnostic information is necessary to determine patient infection status.  Positive results do not rule out bacterial infection or co-infection  with other viruses. False positive results are rare but can occur, and confirmatory RT-PCR testing may be appropriate in some circumstances. The expected result is Negative. Fact Sheet for Patients: PodPark.tn Fact Sheet for Providers: GiftContent.is  This test is not yet approved or cleared by the [place] FDA and  has been authorized for detection and/or diagnosis of SARS-CoV-2 by FDA under an Emergency Use Authorization (EUA).  This EUA will remain in effect (meaning this test  can be used) for the duration of  the COVID- 19 declaration under Section 564(b)(1) of  the Act, 21 U.S.C. section 360bbb-3(b)(1), unless the authorization is terminated or revoked sooner. Performed at [facility], [address] [place], [name] [address]   MRSA PCR Screening     Status: None   Collection Time: 05/07/19 10:25 PM   Specimen: Nasopharyngeal  Result Value Ref Range Status   MRSA by PCR NEGATIVE NEGATIVE Final    Comment:        The GeneXpert MRSA Assay (FDA approved for NASAL specimens only), is one component of a comprehensive MRSA colonization surveillance program. It is not intended to diagnose MRSA infection nor to guide or monitor treatment for MRSA infections. Performed at [facility], [place] [address], [place], [place] [address]   Culture, Urine     Status: Abnormal   Collection Time: 05/09/19  1:19 AM   Specimen: Urine, Clean Catch  Result Value Ref Range Status   Specimen Description URINE, CLEAN CATCH  Final   Special Requests NONE  Final   Culture (A)  Final    <10,000 COLONIES/mL INSIGNIFICANT GROWTH Performed at [facility], [place] [address] [place], [place] [address]    Report Status 05/09/2019 FINAL  Final      Radiology Studies: No results found.     LOS: 5 days   Hephzibah Strehle [employer] on [email or web address]  05/12/2019, 12:20 PM

## 2019-05-12 NOTE — Progress Notes (Signed)
Spoke with Jonathan [name] and answered all questions at this time

## 2019-05-13 LAB — CBC
HCT: 38.6 % — ABNORMAL LOW (ref 39.0–52.0)
Hemoglobin: 12.7 g/dL — ABNORMAL LOW (ref 13.0–17.0)
MCH: 30.1 pg (ref 26.0–34.0)
MCHC: 32.9 g/dL (ref 30.0–36.0)
MCV: 91.5 fL (ref 80.0–100.0)
Platelets: 165 10*3/uL (ref 150–400)
RBC: 4.22 MIL/uL (ref 4.22–5.81)
RDW: 12.6 % (ref 11.5–15.5)
WBC: 8.5 10*3/uL (ref 4.0–10.5)
nRBC: 0 % (ref 0.0–0.2)

## 2019-05-13 LAB — COMPREHENSIVE METABOLIC PANEL
ALT: 15 U/L (ref 0–44)
AST: 12 U/L — ABNORMAL LOW (ref 15–41)
Albumin: 3.2 g/dL — ABNORMAL LOW (ref 3.5–5.0)
Alkaline Phosphatase: 83 U/L (ref 38–126)
Anion gap: 8 (ref 5–15)
BUN: 26 mg/dL — ABNORMAL HIGH (ref 8–23)
CO2: 25 mmol/L (ref 22–32)
Calcium: 8.7 mg/dL — ABNORMAL LOW (ref 8.9–10.3)
Chloride: 108 mmol/L (ref 98–111)
Creatinine, Ser: 0.88 mg/dL (ref 0.61–1.24)
GFR calc Af Amer: >60 mL/min (ref >60)
GFR calc non Af Amer: >60 mL/min (ref >60)
Glucose, Bld: 251 mg/dL — ABNORMAL HIGH (ref 70–99)
Potassium: 4.4 mmol/L (ref 3.5–5.1)
Sodium: 141 mmol/L (ref 135–145)
Total Bilirubin: 0.4 mg/dL (ref 0.3–1.2)
Total Protein: 5.8 g/dL — ABNORMAL LOW (ref 6.5–8.1)

## 2019-05-13 LAB — C-REACTIVE PROTEIN: CRP: 1.7 mg/dL — ABNORMAL HIGH (ref <1.0)

## 2019-05-13 LAB — GLUCOSE, CAPILLARY: Glucose-Capillary: 236 mg/dL — ABNORMAL HIGH (ref 70–99)

## 2019-05-13 LAB — D-DIMER, QUANTITATIVE: D-Dimer, Quant: 0.48 ug/mL-FEU (ref 0.00–0.50)

## 2019-05-13 MED ORDER — DEXAMETHASONE 2 MG PO TABS: ORAL_TABLET | ORAL | 0 refills | Status: DC

## 2019-05-13 NOTE — Progress Notes (Signed)
Written and verbal discharge instructions given to patient.  Patient verbalized understanding of instructions.  Discharged patient to home via private vehicle.

## 2019-05-13 NOTE — Discharge Summary (Signed)
[facility]  Physician Discharge Summary   Patient ID: Jonathan [name] MRN: [id number] DOB/AGE: Nov 05, 1932 85 y.o.  Admit date: 05/07/2019 Discharge date: 05/14/2019  PCP: Binnie Rail, MD  DISCHARGE DIAGNOSES:  Pneumonia due to [id number] Acute metabolic encephalopathy, resolved Paroxysmal atrial fibrillation Diabetes mellitus type 2, uncontrolled with hyperglycemia Hypothyroidism History of gout Hyperlipidemia Essential hypertension   RECOMMENDATIONS FOR OUTPATIENT FOLLOW UP: 1. Follow-up with PCP   Home Health: None Equipment/Devices: None  CODE STATUS: Full code  DISCHARGE CONDITION: fair  Diet recommendation: As before  INITIAL HISTORY: 85 year old male with a history of atrial fibrillation, hypothyroidism, diabetes mellitus, COPD, previous history of subdural hematoma was brought to the ER at [facility] with worsening confusion. Patient did not have any focal weakness of the extremities. He has been having cough but no nausea vomiting or diarrhea. In the ED patient was found to be febrile with temperature 1.8, heart rate was 154 in A. fib with RVR, he was started on Cardizem drip. Covid test came back positive. Ferritin was 95, LDH 150, CRP 5.6, procalcitonin was less than 0.10, chest x-ray was unremarkable. Patient was noted for further management of acute encephalopathy with COVID-19 infection and A. fib with RVR.   HOSPITAL COURSE:   Pneumonia due to COVID-19 Patient was hospitalized.  He was started on steroids and remdesivir.  He had mild hypoxia which has resolved.  His confusion is also resolved.  Inflammatory markers have improved significantly.  He will be discharged on a steroid taper.  He has completed course of remdesivir.    Acute metabolic encephalopathy Most likely multifactorial including Covid infection.   He is back to his baseline now.  Paroxysmal atrial fibrillation with RVR Has a known history of atrial  fibrillation.  Continue Eliquis for anticoagulation.  He was initially in RVR at admission. Now in sinus rhythm.    Diabetes mellitus type 2, uncontrolled with hyperglycemia Elevated CBGs most likely due to steroids.    Should improve as steroid is tapered down.  He may resume his metformin.  HbA1c was 7.4 in August.    History of hypothyroidism Continue levothyroxine.  History of gout Continue allopurinol  Hyperlipidemia Continue Lipitor  Essential hypertension Blood pressure is reasonably well controlled.    Continue home medications.  Mild thrombocytopenia Platelet count back to normal.  Leukopenia Secondary to COVID-19.  Now resolved.  Obesity Estimated body mass index is 31.25 kg/m as calculated from the following:   Height as of this encounter: 5' 7"  (1.702 m).   Weight as of this encounter: 90.5 kg.  Overall stable.  Okay for discharge home today.   PERTINENT LABS:  The results of significant diagnostics from this hospitalization (including imaging, microbiology, ancillary and laboratory) are listed below for reference.    Microbiology: Recent Results (from the past 240 hour(s))  Culture, blood (Routine x 2)     Status: None   Collection Time: 05/07/19 12:34 PM   Specimen: BLOOD LEFT ARM  Result Value Ref Range Status   Specimen Description   Final    BLOOD LEFT ARM Performed at [facility], [address] [place], [name] [address]    Special Requests   Final    BOTTLES DRAWN AEROBIC AND ANAEROBIC Blood Culture adequate volume Performed at [facility], [address] [place], [name] [address]    Culture   Final    NO GROWTH 5 DAYS Performed at [facility], [place]  [address] [place], [place] [address]    Report Status 05/12/2019 FINAL  Final  Culture, blood (Routine x 2)     Status: None   Collection Time: 05/07/19 12:34 PM   Specimen: BLOOD RIGHT FOREARM  Result Value Ref Range Status   Specimen Description    Final    BLOOD RIGHT FOREARM Performed at [facility], [address] [place], [name] [address]    Special Requests   Final    BOTTLES DRAWN AEROBIC AND ANAEROBIC Blood Culture adequate volume Performed at [facility], [address] [place], [name] [address]    Culture   Final    NO GROWTH 5 DAYS Performed at [facility], [place] [address] [place], [place] [address]    Report Status 05/12/2019 FINAL  Final  SARS Coronavirus 2 Ag (30 min TAT) - Nasal Swab (BD Veritor Kit)     Status: Abnormal   Collection Time: 05/07/19  1:01 PM   Specimen: Nasal Swab (BD Veritor Kit)  Result Value Ref Range Status   SARS Coronavirus 2 Ag POSITIVE (A) NEGATIVE Final    Comment: RESULT CALLED TO, READ BACK BY AND VERIFIED WITH: SOPHIE  GOUGE @ 6553 ON 05/07/19, [email or web address] (NOTE) SARS-CoV-2 antigen PRESENT. Positive results indicate the presence of viral antigens, but clinical correlation with patient history and other diagnostic information is necessary to determine patient infection status.  Positive results do not rule out bacterial infection or co-infection  with other viruses. False positive results are rare but can occur, and confirmatory RT-PCR testing may be appropriate in some circumstances. The expected result is Negative. Fact Sheet for Patients: PodPark.tn Fact Sheet for Providers: GiftContent.is  This test is not yet approved or cleared by the [place] FDA and  has been authorized for detection and/or diagnosis of SARS-CoV-2 by FDA under an Emergency Use Authorization (EUA).  This EUA will remain in effect (meaning this test can be used) for the duration of  the COVID- 19 declaration under Section 564(b)(1) of the Act, 21 U.S.C. section 360bbb-3(b)(1), unless the authorization is terminated or revoked sooner. Performed at [facility], [address] [place], [name]  [address]   MRSA PCR Screening     Status: None   Collection Time: 05/07/19 10:25 PM   Specimen: Nasopharyngeal  Result Value Ref Range Status   MRSA by PCR NEGATIVE NEGATIVE Final    Comment:        The GeneXpert MRSA Assay (FDA approved for NASAL specimens only), is one component of a comprehensive MRSA colonization surveillance program. It is not intended to diagnose MRSA infection nor to guide or monitor treatment for MRSA infections. Performed at [facility], [place] [address] [place], [place] [address]   Culture, Urine     Status: Abnormal   Collection Time: 05/09/19  1:19 AM   Specimen: Urine, Clean Catch  Result Value Ref Range Status   Specimen Description URINE, CLEAN CATCH  Final   Special Requests NONE  Final   Culture (A)  Final    <10,000 COLONIES/mL INSIGNIFICANT GROWTH Performed at [facility], [place] [address], [place], [place] [address]    Report Status 05/09/2019 FINAL  Final     Labs:  COVID-19 Labs  Recent Labs    05/12/19 0240 05/13/19 0441  DDIMER 0.54* 0.48  CRP 4.0* 1.7*    Lab Results  Component Value Date   SARSCOV2NAA Not Detected 12/23/2018  Basic Metabolic Panel: Recent Labs  Lab 05/09/19 0447 05/10/19 0344 05/11/19 0250 05/12/19 0240 05/13/19 0441  NA 137 138 140 140 141  K 3.2* 3.4* 3.8 4.1 4.4  CL 102 104 109 111 108  CO2 23 22 21* 22 25  GLUCOSE 148* 156* 266* 252* 251*  BUN 9 12 20  25* 26*  CREATININE 0.99 1.08 0.95 0.86 0.88  CALCIUM 8.1* 8.1* 8.5* 8.6* 8.7*  MG 1.9 1.8  --   --   --    Liver Function Tests: Recent Labs  Lab 05/09/19 0447 05/10/19 0344 05/11/19 0250 05/12/19 0240 05/13/19 0441  AST 16 16 15  11* 12*  ALT 14 13 16 12 15   ALKPHOS 89 87 103 87 83  BILITOT 1.0 0.9 0.4 0.4 0.4  PROT 5.5* 5.6* 6.5 5.7* 5.8*  ALBUMIN 3.0* 3.0* 3.5 3.0* 3.2*   CBC: Recent Labs  Lab 05/08/19 0324 05/09/19 0447 05/10/19 0344 05/11/19 0250 05/12/19 0240 05/13/19 0441  WBC 4.0 4.7 4.5 2.0*  7.8 8.5  NEUTROABS 2.0 2.2 2.1  --   --   --   HGB 13.5 13.4 13.2 14.2 12.4* 12.7*  HCT 41.6 40.0 39.7 43.1 37.4* 38.6*  MCV 93.1 90.3 90.6 90.5 90.6 91.5  PLT 83* 86* 102* 128* 144* 165   BNP: BNP (last 3 results) Recent Labs    05/08/19 0324  BNP 90.8     CBG: Recent Labs  Lab 05/11/19 2101 05/12/19 0750 05/12/19 1138 05/12/19 1605 05/13/19 0738  GLUCAP 226* 227* 313* 343* 236*     IMAGING STUDIES Ct Head Wo Contrast  Result Date: 05/08/2019 CLINICAL DATA:  COVID, acute encephalopathy EXAM: CT HEAD WITHOUT CONTRAST TECHNIQUE: Contiguous axial images were obtained from the base of the skull through the vertex without intravenous contrast. COMPARISON:  04/21/2018 FINDINGS: Brain: No evidence of acute infarction, hemorrhage, hydrocephalus, extra-axial collection or mass lesion/mass effect. Mild cortical and central atrophy. Mild subcortical white matter and periventricular small vessel ischemic changes. Vascular: Intracranial atherosclerosis. Skull: Normal. Negative for fracture or focal lesion. Sinuses/Orbits: Partial opacification the right ethmoid and left sphenoid sinuses. Near complete opacification the right sphenoid sinus. Mastoid air cells are clear. Other: None. IMPRESSION: No evidence of acute intracranial abnormality. Atrophy with small vessel ischemic changes. Electronically Signed   By: Julian Hy M.D.   On: 05/08/2019 10:07   Dg Chest Port 1 View  Result Date: 05/09/2019 CLINICAL DATA:  Fever and cough in a patient who is COVID-19 positive. EXAM: PORTABLE CHEST 1 VIEW COMPARISON:  Single-view of the chest 05/08/2019 and 05/07/2019. FINDINGS: Hazy airspace opacities are seen in the left mid and lower lung zones. Right basilar subsegmental atelectasis is noted. No pneumothorax or pleural fluid. Heart size is normal. Atherosclerosis is seen. IMPRESSION: Hazy airspace opacities in the left mid and lower lung zones worrisome for pneumonia including atypical/viral  infection. Atherosclerosis. Electronically Signed   By: Inge Rise M.D.   On: 05/09/2019 09:21   Dg Chest Port 1 View  Result Date: 05/08/2019 CLINICAL DATA:  COVID-19 positive. EXAM: PORTABLE CHEST 1 VIEW COMPARISON:  May 07, 2019. FINDINGS: The heart size and mediastinal contours are within normal limits. No pneumothorax or pleural effusion is noted. Atherosclerosis of thoracic aorta is noted. Minimal left midlung and right basilar subsegmental atelectasis is noted. The visualized skeletal structures are unremarkable. IMPRESSION: Aortic atherosclerosis. Minimal left midlung and right basilar subsegmental atelectasis. No other abnormality seen in the chest. Electronically Signed   By: Bobbe Medico.D.  On: 05/08/2019 08:24   Dg Chest Portable 1 View  Result Date: 05/07/2019 CLINICAL DATA:  Fever and cough EXAM: PORTABLE CHEST 1 VIEW COMPARISON:  Chest radiograph dated 07/23/2017 FINDINGS: The heart size is normal accounting for technique. Vascular calcifications are seen in the aortic arch. Both lungs are clear. The visualized skeletal structures are unremarkable. IMPRESSION: No active disease. Electronically Signed   By: Zerita Boers M.D.   On: 05/07/2019 12:58    DISCHARGE EXAMINATION: Vitals:   05/12/19 1920 05/12/19 2051 05/13/19 0412 05/13/19 0700  BP:  (!) 152/63 (!) 135/55 137/77  Pulse:  77 62 70  Resp:  18 20 18   Temp:  97.9 F (36.6 C) 98.1 F (36.7 C) 97.7 F (36.5 C)  TempSrc:  Oral Oral Oral  SpO2: 100% 100% 98% 97%  Weight:      Height:       General appearance: Awake alert.  In no distress Resp: Clear to auscultation bilaterally.  Normal effort Cardio: S1-S2 is normal regular.  No S3-S4.  No rubs murmurs or bruit GI: Abdomen is soft.  Nontender nondistended.  Bowel sounds are present normal.  No masses organomegaly Extremities: No edema.  Full range of motion of lower extremities. Neurologic: Alert and oriented x3.  No focal neurological deficits.     DISPOSITION: Home  Discharge Instructions    Call MD for:  difficulty breathing, headache or visual disturbances   Complete by: As directed    Call MD for:  extreme fatigue   Complete by: As directed    Call MD for:  persistant dizziness or light-headedness   Complete by: As directed    Call MD for:  persistant nausea and vomiting   Complete by: As directed    Call MD for:  severe uncontrolled pain   Complete by: As directed    Diet Carb Modified   Complete by: As directed    Discharge instructions   Complete by: As directed    Please follow-up with your PCP.  Take your medications as prescribed.  COVID 19 INSTRUCTIONS  - You are felt to be stable enough to no longer require inpatient monitoring, testing, and treatment, though you will need to follow the recommendations below: - Based on the CDC's non-test criteria for ending self-isolation: You may not return to work/leave the home until at least 21 days since symptom onset AND 3 days without a fever (without taking tylenol, ibuprofen, etc.) AND have improvement in respiratory symptoms. - Do not take NSAID medications (including, but not limited to, ibuprofen, advil, motrin, naproxen, aleve, goody's powder, etc.) - Follow up with your doctor in the next week via telehealth or seek medical attention right away if your symptoms get WORSE.  - Consider donating plasma after you have recovered (either 14 days after a negative test or 28 days after symptoms have completely resolved) because your antibodies to this virus may be helpful to give to others with life-threatening infections. Please go to the website www.[email or web address] if you would like to consider volunteering for plasma donation.    Directions for you at home:  Wear a facemask You should wear a facemask that covers your nose and mouth when you are in the same room with other people and when you visit a healthcare provider. People who live with or visit you should also wear a  facemask while they are in the same room with you.  Separate yourself from other people in your home As much as possible, you should  stay in a different room from other people in your home. Also, you should use a separate bathroom, if available.  Avoid sharing household items You should not share dishes, drinking glasses, cups, eating utensils, towels, bedding, or other items with other people in your home. After using these items, you should wash them thoroughly with soap and water.  Cover your coughs and sneezes Cover your mouth and nose with a tissue when you cough or sneeze, or you can cough or sneeze into your sleeve. Throw used tissues in a lined trash can, and immediately wash your hands with soap and water for at least 20 seconds or use an alcohol-based hand rub.  Wash your [facility] your hands often and thoroughly with soap and water for at least 20 seconds. You can use an alcohol-based hand sanitizer if soap and water are not available and if your hands are not visibly dirty. Avoid touching your eyes, nose, and mouth with unwashed hands.  Directions for those who live with, or provide care at home for you:  Limit the number of people who have contact with the patient If possible, have only one caregiver for the patient. Other household members should stay in another home or place of residence. If this is not possible, they should stay in another room, or be separated from the patient as much as possible. Use a separate bathroom, if available. Restrict visitors who do not have an essential need to be in the home.  Ensure good ventilation Make sure that shared spaces in the home have good air flow, such as from an air conditioner or an opened window, weather permitting.  Wash your hands often Wash your hands often and thoroughly with soap and water for at least 20 seconds. You can use an alcohol based hand sanitizer if soap and water are not available and if your hands are  not visibly dirty. Avoid touching your eyes, nose, and mouth with unwashed hands. Use disposable paper towels to dry your hands. If not available, use dedicated cloth towels and replace them when they become wet.  Wear a facemask and gloves Wear a disposable facemask at all times in the room and gloves when you touch or have contact with the patients blood, body fluids, and/or secretions or excretions, such as sweat, saliva, sputum, nasal mucus, vomit, urine, or feces.  Ensure the mask fits over your nose and mouth tightly, and do not touch it during use. Throw out disposable facemasks and gloves after using them. Do not reuse. Wash your hands immediately after removing your facemask and gloves. If your personal clothing becomes contaminated, carefully remove clothing and launder. Wash your hands after handling contaminated clothing. Place all used disposable facemasks, gloves, and other waste in a lined container before disposing them with other household waste. Remove gloves and wash your hands immediately after handling these items.  Do not share dishes, glasses, or other household items with the patient Avoid sharing household items. You should not share dishes, drinking glasses, cups, eating utensils, towels, bedding, or other items with a patient who is confirmed to have, or being evaluated for, COVID-19 infection. After the person uses these items, you should wash them thoroughly with soap and water.  Wash laundry thoroughly Immediately remove and wash clothes or bedding that have blood, body fluids, and/or secretions or excretions, such as sweat, saliva, sputum, nasal mucus, vomit, urine, or feces, on them. Wear gloves when handling laundry from the patient. Read and follow directions on labels  of laundry or clothing items and detergent. In general, wash and dry with the warmest temperatures recommended on the label.  Clean all areas the individual has used often Clean all touchable  surfaces, such as counters, tabletops, doorknobs, bathroom fixtures, toilets, phones, keyboards, tablets, and bedside tables, every day. Also, clean any surfaces that may have blood, body fluids, and/or secretions or excretions on them. Wear gloves when cleaning surfaces the patient has come in contact with. Use a diluted bleach solution (e.g., dilute bleach with 1 part bleach and 10 parts water) or a household disinfectant with a label that says EPA-registered for coronaviruses. To make a bleach solution at home, add 1 tablespoon of bleach to 1 quart (4 cups) of water. For a larger supply, add  cup of bleach to 1 gallon (16 cups) of water. Read labels of cleaning products and follow recommendations provided on product labels. Labels contain instructions for safe and effective use of the cleaning product including precautions you should take when applying the product, such as wearing gloves or eye protection and making sure you have good ventilation during use of the product. Remove gloves and wash hands immediately after cleaning.  Monitor yourself for signs and symptoms of illness Caregivers and household members are considered close contacts, should monitor their health, and will be asked to limit movement outside of the home to the extent possible. Follow the monitoring steps for close contacts listed on the symptom monitoring form.   If you have additional questions, contact your local health department or call the epidemiologist on call at [phone] (available 24/7). This guidance is subject to change. For the most up-to-date guidance from [facility], please refer to their website: YouBlogs.pl   You were cared for by a hospitalist during your hospital stay. If you have any questions about your discharge medications or the care you received while you were in the hospital after you are discharged, you can call the unit and asked to  speak with the hospitalist on call if the hospitalist that took care of you is not available. Once you are discharged, your primary care physician will handle any further medical issues. Please note that NO REFILLS for any discharge medications will be authorized once you are discharged, as it is imperative that you return to your primary care physician (or establish a relationship with a primary care physician if you do not have one) for your aftercare needs so that they can reassess your need for medications and monitor your lab values. If you do not have a primary care physician, you can call [phone] for a physician referral.   Increase activity slowly   Complete by: As directed         Allergies as of 05/13/2019      Reactions   Other Other (See Comments)   DUST-Other reaction(s): Respiratory distress      Medication List    TAKE these medications   acetaminophen 500 MG tablet Commonly known as: TYLENOL Take 2 tablets (1,000 mg total) by mouth every 8 (eight) hours as needed. What changed: when to take this   allopurinol 300 MG tablet Commonly known as: ZYLOPRIM TAKE 1 TABLET BY MOUTH  EVERY EVENING   apixaban 5 MG Tabs tablet Commonly known as: Eliquis Take 1 tablet (5 mg total) by mouth 2 (two) times daily.   atorvastatin 20 MG tablet Commonly known as: LIPITOR Take 1 tablet (20 mg total) by mouth daily at 6 PM. What changed: when to take this  Biotin 5000 MCG Tabs Take 5,000 mcg by mouth daily.   blood glucose meter kit and supplies Kit Dispense based on patient and insurance preference. Use daily as directed. (FOR E11.9).   brimonidine 0.15 % ophthalmic solution Commonly known as: ALPHAGAN Place 1 drop into the left eye 2 (two) times daily.   calcium-vitamin D 500-200 MG-UNIT tablet Commonly known as: OSCAL WITH D Take 1 tablet by mouth daily with breakfast.   Colcrys 0.6 MG tablet Generic drug: colchicine Take 0.6 mg by mouth daily as needed (flareups).    dexamethasone 2 MG tablet Commonly known as: DECADRON Take 2 tablets once daily for 3 days, then 1 tablet once daily for 3 days, then STOP.   diltiazem 300 MG 24 hr capsule Commonly known as: CARDIZEM CD TAKE 1 CAPSULE BY MOUTH  DAILY   doxazosin 8 MG tablet Commonly known as: CARDURA TAKE 1 TABLET BY MOUTH  DAILY. What changed:   how much to take  how to take this  when to take this  additional instructions   DULoxetine 30 MG capsule Commonly known as: CYMBALTA TAKE 2 CAPSULES BY MOUTH IN THE MORNING AND 1 CAPSULE  IN THE EVENING What changed: See the new instructions.   GLUCOSAMINE 1500 COMPLEX PO Take 1 tablet by mouth 2 (two) times daily.   glucose blood test strip Use to check blood sugar daily. E11.9   hydrochlorothiazide 25 MG tablet Commonly known as: HYDRODIURIL TAKE 1 TABLET BY MOUTH  DAILY   Lancets Misc Use to check blood sugars daily. E11.9   latanoprost 0.005 % ophthalmic solution Commonly known as: XALATAN Place 1 drop into both eyes at bedtime.   levalbuterol 45 MCG/ACT inhaler Commonly known as: Xopenex HFA Inhale 1-2 puffs into the lungs every 4 (four) hours as needed for wheezing.   levothyroxine 112 MCG tablet Commonly known as: SYNTHROID TAKE 1 TABLET BY MOUTH EVERY DAY What changed: when to take this   magnesium oxide 400 MG tablet Commonly known as: MAG-OX Take 400 mg by mouth daily.   Melatonin 5 MG Tabs Take 5 mg by mouth at bedtime.   metFORMIN 750 MG 24 hr tablet Commonly known as: Glucophage XR Take 1 tablet (750 mg total) by mouth daily with supper.   mometasone 50 MCG/ACT nasal spray Commonly known as: NASONEX Place 2 sprays into the nose daily.   multivitamin tablet Take 1 tablet by mouth daily.   nitroGLYCERIN 0.4 MG SL tablet Commonly known as: NITROSTAT Place 1 tablet (0.4 mg total) under the tongue every 5 (five) minutes as needed for chest pain.   omeprazole 20 MG capsule Commonly known as: PRILOSEC TAKE  1 CAPSULE BY MOUTH  DAILY   oxyCODONE-acetaminophen 10-325 MG tablet Commonly known as: PERCOCET Take 1 tablet by mouth every 8 (eight) hours as needed for pain.   potassium chloride SA 20 MEQ tablet Commonly known as: KLOR-CON Take 1 tablet (20 mEq total) by mouth daily.   solifenacin 5 MG tablet Commonly known as: VESICARE Take 5 mg by mouth daily.   tadalafil 20 MG tablet Commonly known as: CIALIS Take 1 tablet (20 mg total) by mouth daily as needed. For erectile dysfunction   vitamin C 500 MG tablet Commonly known as: ASCORBIC ACID Take 500 mg by mouth 2 (two) times daily.   VITAMIN D (CHOLECALCIFEROL) PO Take 25 mcg by mouth daily.   zaleplon 10 MG capsule Commonly known as: SONATA Take 1 capsule (10 mg total) by mouth at bedtime.  zolpidem 10 MG tablet Commonly known as: AMBIEN TAKE 1 TABLET (10 MG TOTAL) BY MOUTH AT BEDTIME AS NEEDED FOR SLEEP. What changed: See the new instructions.        Follow-up Information    Binnie Rail, MD Follow up in 2 week(s).   Specialty: Internal Medicine Contact information: [place] [address] [phone]           TOTAL DISCHARGE TIME: 76 minutes  [place]  [facility] Pager on [email or web address]  05/14/2019, 4:52 PM

## 2019-05-15 ENCOUNTER — Encounter: Payer: Self-pay | Admitting: Internal Medicine

## 2019-05-15 ENCOUNTER — Telehealth: Payer: Self-pay

## 2019-05-15 NOTE — Telephone Encounter (Signed)
Transition Care Management Follow-up Telephone Call   Date discharged? 05/13/2019  How have you been since you were released from the hospital? Feeling better but tired.   Do you understand why you were in the hospital? YES  Do you understand the discharge instructions? YES  Where were you discharged to? Home  Items Reviewed:  Medications reviewed: Yes  Allergies reviewed: Yes  Dietary changes reviewed: Yes  Referrals reviewed: Yes  Functional Questionnaire:   Activities of Daily Living (ADLs):   States they are independent in the following: All ADL's States they require assistance with the following: n/a  Any transportation issues/concerns?: Not at this time  Any patient concerns? Not at this time  Confirmed importance and date/time of follow-up visits scheduled: Not scheduled at this time.  Provider Appointment booked with: Not scheduled at this time.   Confirmed with patient if condition begins to worsen call PCP or go to the ER: Yes Patient was given the office number and encouraged to call back with question or concerns: Yes

## 2019-05-23 ENCOUNTER — Other Ambulatory Visit: Payer: Self-pay | Admitting: Internal Medicine

## 2019-05-23 NOTE — Telephone Encounter (Signed)
Last OV 02/09/19 Next OV 08/14/19 Last RF 04/12/19

## 2019-05-29 ENCOUNTER — Telehealth: Payer: Self-pay

## 2019-05-29 NOTE — Telephone Encounter (Signed)
Copied from [place] [phone]. Topic: Referral - Status >> May 29, 2019  0000000 PM Simone Curia D wrote: [phone] Follow-up call. Left message on voicemail for patient to return my call regarding financial assistance for medications. Ambrose Mantle [phone]

## 2019-05-31 ENCOUNTER — Telehealth: Payer: Self-pay

## 2019-05-31 NOTE — Telephone Encounter (Signed)
Copied from [place] [phone] [phone]. Topic: Referral - Status >> May 31, 2019 [id number] PM Simone Curia D wrote: [id number] 3rd attempt. Follow-up call. Left messages on patient's and his wife's voicemail for patient to return my call regarding financial assistance for medications. Ambrose Mantle [phone]

## 2019-06-02 ENCOUNTER — Other Ambulatory Visit: Payer: Self-pay | Admitting: Internal Medicine

## 2019-06-06 ENCOUNTER — Other Ambulatory Visit: Payer: Self-pay | Admitting: Cardiology

## 2019-06-06 NOTE — Telephone Encounter (Signed)
*  STAT* If patient is at the pharmacy, call can be transferred to refill team.   1. Which medications need to be refilled? (please list name of each medication and dose if known) Eliquis 5mg   2. Which pharmacy/location (including street and city if local pharmacy) is medication to be sent to?[facility]  3. Do they need a 30 day or 90 day supply? [place]

## 2019-06-07 MED ORDER — APIXABAN 5 MG PO TABS: 5.0000 mg | ORAL_TABLET | Freq: Two times a day (BID) | ORAL | 1 refills | Status: DC

## 2019-06-26 NOTE — Progress Notes (Signed)
Subjective:    Patient ID: Jonathan [name], male    DOB: Jun 30, 1932, 86 y.o.   MRN: [id number]  HPI The patient is here for an acute visit.   Spot on back: He noticed a small area on his back that was causing discomfort proximally 5 days ago.  It felt like a lump and he was laying on a pebble.  His wife looked at it and did not initially see anything, but after applying pressure some pus did come out.  For the past 4 days she has been able to get some pus out twice daily.  It does feel smaller to him.  He has not had any fevers or chills.      His wife sent a mychart message prior to his appointment, which he is aware of.  Per his wife he had bursts of anger and paranoia last week.  One morning he was up at 5 am and had turned on all the lights on the first floor.  His wife walked around and turned each one off and he was unable to tell her why he turned them all on.  His short term memory is completely gone.  She states some power and increased control.  His wife has seen a big decline since his COVID infection.    He does not remember turning on all the lights in the house - he has no memory of it. This has never happened before -him not remembering something like this.  There is nothing different about that day.  He takes sleep medication every night and there has been no changes.  He states more cognitive delay.  He noticed this even before he got Covid, but it has been worse since then.  He can not recall names/things as well -that is much different since his Covid infection.  This morning he sat at the computer and could not remember some things he does daily basis.    He has become more reactive to certain things.  He reacting is not new - just more reactive.  This is where he thinks his wife talks about the control and power.  The example he gave me was he was frustrated sometimes by asking her question and getting a response and being more forceful afterwards due to more clear  response.  His short term memory is much worse since covid.  He feels depressed. He states mild anxiety.   He did not sleep much last night.  He does take a sleep medication nightly.  He has been alternating between using Sonata or Ambien-he uses 1 for a month and then will switch to the other because they stop working after a while.  He has had sleep issues for decades.   Medications and allergies reviewed with patient and updated if appropriate.  Patient Active Problem List   Diagnosis Date Noted  . Atrial fibrillation with rapid ventricular response ([place]) 05/08/2019  . COVID-19 05/08/2019  . Acute encephalopathy 05/08/2019  . Paroxysmal atrial fibrillation with RVR ([place]) 05/07/2019  . Obstructive sleep apnea   . Acute cystitis without hematuria 05/18/2018  . Chronic anticoagulation 12/21/2017  . Mid back pain on left side 08/10/2017  . Spinal compression fracture ([place])   . Osteoarthritis   . Hypoxia   . Fibromyalgia   . Fatty tumor   . Cataract   . BPH (benign prostatic hyperplasia)   . PAF (paroxysmal atrial fibrillation) ([place])   . Insomnia 03/10/2017  . Chronic pain, legs  and back 03/10/2017  . Hyperlipidemia 03/10/2017  . Obesity (BMI 30.0-34.9) 12/08/2016  . Depression 09/07/2016  . Hypothyroidism 03/08/2016  . GERD (gastroesophageal reflux disease) 03/08/2016  . Gout 03/08/2016  . Glaucoma 03/08/2016  . Type 2 diabetes mellitus ([place]) 03/08/2016  . Hereditary and idiopathic peripheral neuropathy 08/28/2015  . Bruit 08/15/2013  . Essential hypertension 12/04/2011  . Allergic rhinitis, seasonal 12/04/2011  . COPD (chronic obstructive pulmonary disease) ([place]) 07/18/2011    Current Outpatient Medications on File Prior to Visit  Medication Sig Dispense Refill  . acetaminophen (TYLENOL) 500 MG tablet Take 2 tablets (1,000 mg total) by mouth every 8 (eight) hours as needed. (Patient taking differently: Take 1,000 mg by mouth 2 (two) times daily. ) 360 tablet 1  .  allopurinol (ZYLOPRIM) 300 MG tablet TAKE 1 TABLET BY MOUTH IN  THE EVENING 90 tablet 3  . apixaban (ELIQUIS) 5 MG TABS tablet Take 1 tablet (5 mg total) by mouth 2 (two) times daily. 180 tablet 1  . atorvastatin (LIPITOR) 20 MG tablet Take 1 tablet (20 mg total) by mouth daily at 6 PM. (Patient taking differently: Take 20 mg by mouth daily. ) 90 tablet 1  . Biotin 5000 MCG TABS Take 5,000 mcg by mouth daily.    . blood glucose meter kit and supplies KIT Dispense based on patient and insurance preference. Use daily as directed. (FOR E11.9). 1 each 0  . brimonidine (ALPHAGAN) 0.15 % ophthalmic solution Place 1 drop into the left eye 2 (two) times daily.     . calcium-vitamin D (OSCAL WITH D) 500-200 MG-UNIT tablet Take 1 tablet by mouth daily with breakfast.    . colchicine (COLCRYS) 0.6 MG tablet Take 0.6 mg by mouth daily as needed (flareups).     Marland Kitchen dexamethasone (DECADRON) 2 MG tablet Take 2 tablets once daily for 3 days, then 1 tablet once daily for 3 days, then STOP. 9 tablet 0  . diltiazem (CARDIZEM CD) 300 MG 24 hr capsule TAKE 1 CAPSULE BY MOUTH  DAILY (Patient taking differently: Take 300 mg by mouth daily. ) 90 capsule 3  . doxazosin (CARDURA) 8 MG tablet TAKE 1 TABLET BY MOUTH  DAILY. (Patient taking differently: Take 8 mg by mouth daily. ) 90 tablet 1  . DULoxetine (CYMBALTA) 30 MG capsule TAKE 2 CAPSULES BY MOUTH IN THE MORNING AND 1 CAPSULE  IN THE EVENING (Patient taking differently: Take 30-60 mg by mouth See admin instructions. TAKE 2 CAPSULES BY MOUTH IN THE MORNING AND 1 CAPSULE  IN THE EVENING) 270 capsule 3  . glucose blood test strip Use to check blood sugar daily. E11.9 100 each 3  . hydrochlorothiazide (HYDRODIURIL) 25 MG tablet TAKE 1 TABLET BY MOUTH  DAILY (Patient taking differently: Take 25 mg by mouth daily. ) 90 tablet 1  . Lancets MISC Use to check blood sugars daily. E11.9 100 each 3  . latanoprost (XALATAN) 0.005 % ophthalmic solution Place 1 drop into both eyes at  bedtime. 7.5 mL 3  . levalbuterol (XOPENEX HFA) 45 MCG/ACT inhaler Inhale 1-2 puffs into the lungs every 4 (four) hours as needed for wheezing. 1 Inhaler 12  . levothyroxine (SYNTHROID) 112 MCG tablet TAKE 1 TABLET BY MOUTH EVERY DAY (Patient taking differently: Take 112 mcg by mouth daily before breakfast. ) 90 tablet 1  . magnesium oxide (MAG-OX) 400 MG tablet Take 400 mg by mouth daily.    . Melatonin 5 MG TABS Take 5 mg by mouth at bedtime.    Marland Kitchen  metFORMIN (GLUCOPHAGE XR) 750 MG 24 hr tablet Take 1 tablet (750 mg total) by mouth daily with supper. 90 tablet 1  . mometasone (NASONEX) 50 MCG/ACT nasal spray Place 2 sprays into the nose daily. 17 g 3  . Multiple Vitamin (MULTIVITAMIN) tablet Take 1 tablet by mouth daily.    . nitroGLYCERIN (NITROSTAT) 0.4 MG SL tablet Place 1 tablet (0.4 mg total) under the tongue every 5 (five) minutes as needed for chest pain. 90 tablet 3  . omeprazole (PRILOSEC) 20 MG capsule TAKE 1 CAPSULE BY MOUTH  DAILY 90 capsule 3  . oxyCODONE-acetaminophen (PERCOCET) 10-325 MG tablet Take 1 tablet by mouth every 8 (eight) hours as needed for pain.    . potassium chloride SA (K-DUR) 20 MEQ tablet Take 1 tablet (20 mEq total) by mouth daily. 90 tablet 3  . solifenacin (VESICARE) 5 MG tablet Take 5 mg by mouth daily.    . vitamin C (ASCORBIC ACID) 500 MG tablet Take 500 mg by mouth 2 (two) times daily.     Marland Kitchen VITAMIN D, CHOLECALCIFEROL, PO Take 25 mcg by mouth daily.     . zaleplon (SONATA) 10 MG capsule Take 1 capsule (10 mg total) by mouth at bedtime. 30 capsule 0  . zolpidem (AMBIEN) 10 MG tablet TAKE 1 TABLET BY MOUTH AT BEDTIME AS NEEDED FOR SLEEP. 30 tablet 0   No current facility-administered medications on file prior to visit.    Past Medical History:  Diagnosis Date  . Atrial fibrillation (HCC)    a. paroxysmal, on Eliquis for anticoagulation  . BPH (benign prostatic hyperplasia)   . Bronchitis   . Cataract   . COPD (chronic obstructive pulmonary disease)  (H[facility])    2 liters O2 HS  . Depression   . Fatty tumor    waste and back  . Fibromyalgia   . GERD (gastroesophageal reflux disease)   . Glaucoma    bilateral eyes  . Gout   . Hereditary and idiopathic peripheral neuropathy 08/28/2015  . Hypertension   . Hypothyroidism   . Hypoxia   . Insomnia   . Osteoarthritis   . Osteoporosis   . Peripheral neuropathy   . Sleep apnea    uses cpap-add oxygen at night  . Spinal compression fracture (H[facility]) seventh vertebre  . Transient alteration of awareness 08/28/2015  . Type 2 diabetes mellitus ([place]) 03/08/2016    Past Surgical History:  Procedure Laterality Date  . APPENDECTOMY  age 44  . CARPAL TUNNEL RELEASE Right    early 2000s  . CATARACT EXTRACTION     bilateral  . CHOLECYSTECTOMY  age 15  . EYE SURGERY    . FOOT ARTHRODESIS Right 02/02/2013   Procedure: RIGHT HALLUX METATARSAL PHALANGEAL JOINT ARTHRODESIS ;  Surgeon: Wylene Simmer, MD;  Location: [facility];  Service: Orthopedics;  Laterality: Right;  . INGUINAL HERNIA REPAIR  age 20   rt side  . NASAL CONCHA BULLOSA RESECTION  age 10  . PROSTATE SURGERY    . SHOULDER ARTHROSCOPY W/ ROTATOR CUFF REPAIR Right    early 2000s  . tonsil    . VASECTOMY  age 60    Social History   Socioeconomic History  . Marital status: Married    Spouse name: Not on file  . Number of children: 2  . Years of education: 16  . Highest education level: Master's degree (e.g., MA, MS, MEng, MEd, MSW, MBA)  Occupational History  . Occupation: RETIRED  Employer: RETIRED  Tobacco Use  . Smoking status: Former Smoker    Packs/day: 2.00    Years: 10.00    Pack years: 20.00    Types: Cigarettes    Quit date: 07/18/1975    Years since quitting: 43.9  . Smokeless tobacco: Never Used  Substance and Sexual Activity  . Alcohol use: Yes    Comment: 1-2 drinks/day  . Drug use: No  . Sexual activity: Not Currently  Other Topics Concern  . Not on file  Social History Narrative  . Not  on file   Social Determinants of Health   Financial Resource Strain:   . Difficulty of Paying Living Expenses: Not on file  Food Insecurity:   . Worried About Charity fundraiser in the Last Year: Not on file  . Ran Out of Food in the Last Year: Not on file  Transportation Needs:   . Lack of Transportation (Medical): Not on file  . Lack of Transportation (Non-Medical): Not on file  Physical Activity:   . Days of Exercise per Week: Not on file  . Minutes of Exercise per Session: Not on file  Stress:   . Feeling of Stress : Not on file  Social Connections:   . Frequency of Communication with Friends and Family: Not on file  . Frequency of Social Gatherings with Friends and Family: Not on file  . Attends Religious Services: Not on file  . Active Member of Clubs or Organizations: Not on file  . Attends Archivist Meetings: Not on file  . Marital Status: Not on file    Family History  Problem Relation Age of Onset  . Coronary artery disease Brother   . Heart disease Father   . Lung cancer Father   . Kidney cancer Father   . Prostate cancer Father   . Arthritis Mother   . Lung cancer Mother   . Dementia Mother        Unspecified type, not Alzheimer's disease    Review of Systems  Constitutional: Positive for fatigue. Negative for chills and fever.  HENT:       Chronic loss of taste/smell not from covid  Respiratory: Negative for cough, shortness of breath and wheezing.   Cardiovascular: Positive for leg swelling (minimal). Negative for chest pain and palpitations.  Musculoskeletal: Positive for arthralgias.  Neurological: Negative for headaches.  Psychiatric/Behavioral: Positive for dysphoric mood and sleep disturbance (chronic). The patient is nervous/anxious.        Objective:   Vitals:   06/27/19 1000  BP: 128/60  Pulse: 66  Resp: 16  Temp: 98.3 F (36.8 C)  SpO2: 96%   BP Readings from Last 3 Encounters:  06/27/19 128/60  05/13/19 137/77    04/26/19 (!) 142/88   Wt Readings from Last 3 Encounters:  06/27/19 198 lb 12.8 oz (90.2 kg)  05/09/19 199 lb 8.3 oz (90.5 kg)  04/26/19 203 lb (92.1 kg)   Body mass index is 31.14 kg/m.   Physical Exam    Constitutional: Appears well-developed and well-nourished. No distress.  Head: Normocephalic and atraumatic.  Neck: Neck supple. No tracheal deviation present. No thyromegaly present.  No cervical lymphadenopathy Cardiovascular: Normal rate, regular rhythm and normal heart sounds.  No murmur heard. No carotid bruit .  No edema Pulmonary/Chest: Effort normal and breath sounds normal. No respiratory distress. No has no wheezes. No rales.  Skin: Skin is warm and dry. Not diaphoretic.  Sebaceous cyst mid back just left of  vertebrae.  No erythema, surrounding swelling, warmth or tenderness with palpation.  With applying pressure no pus extraction.  No fluctuance. Psychiatric: Normal mood and affect. Behavior is normal.       Assessment & Plan:    See Problem List for Assessment and Plan of chronic medical problems.    This visit occurred during the SARS-CoV-2 public health emergency.  Safety protocols were in place, including screening questions prior to the visit, additional usage of staff PPE, and extensive cleaning of exam room while observing appropriate contact time as indicated for disinfecting solutions.

## 2019-06-27 ENCOUNTER — Ambulatory Visit (INDEPENDENT_AMBULATORY_CARE_PROVIDER_SITE_OTHER): Payer: Medicare Other | Admitting: Internal Medicine

## 2019-06-27 ENCOUNTER — Other Ambulatory Visit: Payer: Self-pay

## 2019-06-27 ENCOUNTER — Encounter: Payer: Self-pay | Admitting: Internal Medicine

## 2019-06-27 VITALS — BP 128/60 | HR 66 | Temp 98.3°F | Resp 16 | Ht 67.0 in | Wt 198.8 lb

## 2019-06-27 DIAGNOSIS — L723 Sebaceous cyst: Secondary | ICD-10-CM | POA: Diagnosis not present

## 2019-06-27 DIAGNOSIS — F330 Major depressive disorder, recurrent, mild: Secondary | ICD-10-CM | POA: Diagnosis not present

## 2019-06-27 DIAGNOSIS — G4700 Insomnia, unspecified: Secondary | ICD-10-CM | POA: Diagnosis not present

## 2019-06-27 HISTORY — DX: Sebaceous cyst: L72.3

## 2019-06-27 MED ORDER — ZALEPLON 10 MG PO CAPS: 10.0000 mg | ORAL_CAPSULE | Freq: Every day | ORAL | 0 refills | Status: DC

## 2019-06-27 MED ORDER — ATORVASTATIN CALCIUM 20 MG PO TABS: 20.0000 mg | ORAL_TABLET | Freq: Every day | ORAL | 1 refills | Status: DC

## 2019-06-27 NOTE — Assessment & Plan Note (Signed)
Sebaceous cyst of the mid back Currently appears to not be infected Over the past few days his wife was able to extract some pus out of the cyst and it does sound like it was infected At this time because it there is no tenderness, redness we will hold off on antibiotics, but he will monitor this closely and have his wife monitor it and if any redness, pain or swelling develops he will let me know and I will put him on an antibiotic Discussed that he may want to consider seeing dermatology to have this removed so it does not become a recurrent problem

## 2019-06-27 NOTE — Assessment & Plan Note (Signed)
His insomnia is chronic-he has had it for decades I do not think the sleep medication is ideal, especially given memory concerns Unfortunately since he has been on this medication for years I do not think that he can go without medication Continue either Sonata or Ambien Has been referred to psychiatry

## 2019-06-27 NOTE — Assessment & Plan Note (Signed)
He does admit to some depression ?  Contributing to some of his and his wife's concerns Currently taking Cymbalta 90 mg daily And it would be best for him to see a psychiatrist to have a full evaluation before making any medication changes Continue current medications Referral ordered

## 2019-06-27 NOTE — Patient Instructions (Signed)
    Medications reviewed and updated.  Changes include :   none  Your prescription(s) have been submitted to your pharmacy. Please take as directed and contact our office if you believe you are having problem(s) with the medication(s).  A referral was ordered for Dr Clovis Pu, psychiatry.

## 2019-07-06 DIAGNOSIS — H04123 Dry eye syndrome of bilateral lacrimal glands: Secondary | ICD-10-CM | POA: Diagnosis not present

## 2019-07-06 DIAGNOSIS — H16103 Unspecified superficial keratitis, bilateral: Secondary | ICD-10-CM | POA: Diagnosis not present

## 2019-07-07 ENCOUNTER — Encounter: Payer: Self-pay | Admitting: Internal Medicine

## 2019-07-08 MED ORDER — ZALEPLON 10 MG PO CAPS: 10.0000 mg | ORAL_CAPSULE | Freq: Every day | ORAL | 0 refills | Status: DC

## 2019-07-10 ENCOUNTER — Encounter: Payer: Self-pay | Admitting: Internal Medicine

## 2019-07-12 DIAGNOSIS — M79641 Pain in right hand: Secondary | ICD-10-CM | POA: Diagnosis not present

## 2019-07-12 DIAGNOSIS — M720 Palmar fascial fibromatosis [Dupuytren]: Secondary | ICD-10-CM

## 2019-07-12 DIAGNOSIS — M79642 Pain in left hand: Secondary | ICD-10-CM | POA: Diagnosis not present

## 2019-07-12 DIAGNOSIS — M19041 Primary osteoarthritis, right hand: Secondary | ICD-10-CM | POA: Diagnosis not present

## 2019-07-12 DIAGNOSIS — M19042 Primary osteoarthritis, left hand: Secondary | ICD-10-CM | POA: Diagnosis not present

## 2019-07-12 HISTORY — DX: Palmar fascial fibromatosis (dupuytren): M72.0

## 2019-07-13 NOTE — Progress Notes (Signed)
HPI: FU atrial fibrillation. Previously with a fib but converted spontaneously to sinus rhythm. Patient also with significant COPD. Abdominal ultrasound March 2015 showed no aneurysm. Nuclear study July 2016 showed ejection fraction 56% and normal perfusion. Echocardiogram repeated August 2018 and showed normal LV function, grade 2 diastolic dysfunction and mild left atrial enlargement. Monitor August 2018 showed sinus rhythm with PACs, PVCs and PAF.Monitor repeated July 2019. Sinus rhythm with PACs, PVCs, rare couplet and 3 beats nonsustained ventricular tachycardia. Had fall September 2019 resulting in subdural hematoma. Had Covid 11/20. Since I last sawhim,he denies dyspnea, chest pain, palpitations or syncope.  Current Outpatient Medications  Medication Sig Dispense Refill  . acetaminophen (TYLENOL) 500 MG tablet Take 2 tablets (1,000 mg total) by mouth every 8 (eight) hours as needed. (Patient taking differently: Take 1,000 mg by mouth 2 (two) times daily. ) 360 tablet 1  . allopurinol (ZYLOPRIM) 300 MG tablet TAKE 1 TABLET BY MOUTH IN  THE EVENING 90 tablet 3  . apixaban (ELIQUIS) 5 MG TABS tablet Take 1 tablet (5 mg total) by mouth 2 (two) times daily. 180 tablet 1  . atorvastatin (LIPITOR) 20 MG tablet Take 1 tablet (20 mg total) by mouth daily. 90 tablet 1  . bimatoprost (LUMIGAN) 0.01 % SOLN 1 drop at bedtime.    . Biotin 5000 MCG TABS Take 5,000 mcg by mouth daily.    . blood glucose meter kit and supplies KIT Dispense based on patient and insurance preference. Use daily as directed. (FOR E11.9). 1 each 0  . calcium-vitamin D (OSCAL WITH D) 500-200 MG-UNIT tablet Take 1 tablet by mouth daily with breakfast.    . colchicine (COLCRYS) 0.6 MG tablet Take 0.6 mg by mouth daily as needed (flareups).     . diltiazem (CARDIZEM CD) 300 MG 24 hr capsule TAKE 1 CAPSULE BY MOUTH  DAILY (Patient taking differently: Take 300 mg by mouth daily. ) 90 capsule 3  . dorzolamidel-timolol (COSOPT)  22.3-6.8 MG/ML SOLN ophthalmic solution 1 drop 2 (two) times daily.    Marland Kitchen doxazosin (CARDURA) 8 MG tablet TAKE 1 TABLET BY MOUTH  DAILY. (Patient taking differently: Take 8 mg by mouth daily. ) 90 tablet 1  . DULoxetine (CYMBALTA) 30 MG capsule TAKE 2 CAPSULES BY MOUTH IN THE MORNING AND 1 CAPSULE  IN THE EVENING (Patient taking differently: Take 30-60 mg by mouth See admin instructions. TAKE 2 CAPSULES BY MOUTH IN THE MORNING AND 1 CAPSULE  IN THE EVENING) 270 capsule 3  . glucose blood test strip Use to check blood sugar daily. E11.9 100 each 3  . hydrochlorothiazide (HYDRODIURIL) 25 MG tablet TAKE 1 TABLET BY MOUTH  DAILY (Patient taking differently: Take 25 mg by mouth daily. ) 90 tablet 1  . Lancets MISC Use to check blood sugars daily. E11.9 100 each 3  . levothyroxine (SYNTHROID) 112 MCG tablet TAKE 1 TABLET BY MOUTH EVERY DAY (Patient taking differently: Take 112 mcg by mouth daily before breakfast. ) 90 tablet 1  . magnesium oxide (MAG-OX) 400 MG tablet Take 400 mg by mouth daily.    . Melatonin 5 MG TABS Take 5 mg by mouth at bedtime.    . metFORMIN (GLUCOPHAGE XR) 750 MG 24 hr tablet Take 1 tablet (750 mg total) by mouth daily with supper. 90 tablet 1  . mometasone (NASONEX) 50 MCG/ACT nasal spray Place 2 sprays into the nose daily. 17 g 3  . Multiple Vitamin (MULTIVITAMIN) tablet Take 1 tablet  by mouth daily.    . nitroGLYCERIN (NITROSTAT) 0.4 MG SL tablet Place 1 tablet (0.4 mg total) under the tongue every 5 (five) minutes as needed for chest pain. 90 tablet 3  . omeprazole (PRILOSEC) 20 MG capsule TAKE 1 CAPSULE BY MOUTH  DAILY 90 capsule 3  . oxyCODONE-acetaminophen (PERCOCET) 10-325 MG tablet Take 1 tablet by mouth every 8 (eight) hours as needed for pain.    . potassium chloride SA (K-DUR) 20 MEQ tablet Take 1 tablet (20 mEq total) by mouth daily. 90 tablet 3  . solifenacin (VESICARE) 5 MG tablet Take 5 mg by mouth daily.    . vitamin C (ASCORBIC ACID) 500 MG tablet Take 500 mg by  mouth 2 (two) times daily.     Marland Kitchen VITAMIN D, CHOLECALCIFEROL, PO Take 25 mcg by mouth daily.     . zaleplon (SONATA) 10 MG capsule Take 1 capsule (10 mg total) by mouth at bedtime. 30 capsule 0  . zolpidem (AMBIEN) 10 MG tablet TAKE 1 TABLET BY MOUTH AT BEDTIME AS NEEDED FOR SLEEP. 30 tablet 0   No current facility-administered medications for this visit.     Past Medical History:  Diagnosis Date  . Atrial fibrillation (HCC)    a. paroxysmal, on Eliquis for anticoagulation  . BPH (benign prostatic hyperplasia)   . Bronchitis   . Cataract   . COPD (chronic obstructive pulmonary disease) (HCC)    2 liters O2 HS  . Depression   . Fatty tumor    waste and back  . Fibromyalgia   . GERD (gastroesophageal reflux disease)   . Glaucoma    bilateral eyes  . Gout   . Hereditary and idiopathic peripheral neuropathy 08/28/2015  . Hypertension   . Hypothyroidism   . Hypoxia   . Insomnia   . Osteoarthritis   . Osteoporosis   . Peripheral neuropathy   . Sleep apnea    uses cpap-add oxygen at night  . Spinal compression fracture (HCC) seventh vertebre  . Transient alteration of awareness 08/28/2015  . Type 2 diabetes mellitus ([place]) 03/08/2016    Past Surgical History:  Procedure Laterality Date  . APPENDECTOMY  age 18  . CARPAL TUNNEL RELEASE Right    early 2000s  . CATARACT EXTRACTION     bilateral  . CHOLECYSTECTOMY  age 63  . EYE SURGERY    . FOOT ARTHRODESIS Right 02/02/2013   Procedure: RIGHT HALLUX METATARSAL PHALANGEAL JOINT ARTHRODESIS ;  Surgeon: Wylene Simmer, MD;  Location: [facility];  Service: Orthopedics;  Laterality: Right;  . INGUINAL HERNIA REPAIR  age 12   rt side  . NASAL CONCHA BULLOSA RESECTION  age 55  . PROSTATE SURGERY    . SHOULDER ARTHROSCOPY W/ ROTATOR CUFF REPAIR Right    early 2000s  . tonsil    . VASECTOMY  age 75    Social History   Socioeconomic History  . Marital status: Married    Spouse name: Not on file  . Number of  children: 2  . Years of education: 50  . Highest education level: Master's degree (e.g., MA, MS, MEng, MEd, MSW, MBA)  Occupational History  . Occupation: RETIRED    Employer: RETIRED  Tobacco Use  . Smoking status: Former Smoker    Packs/day: 2.00    Years: 10.00    Pack years: 20.00    Types: Cigarettes    Quit date: 07/18/1975    Years since quitting: 44.0  . Smokeless tobacco:  Never Used  Substance and Sexual Activity  . Alcohol use: Yes    Comment: 1-2 drinks/day  . Drug use: No  . Sexual activity: Not Currently  Other Topics Concern  . Not on file  Social History Narrative  . Not on file   Social Determinants of Health   Financial Resource Strain:   . Difficulty of Paying Living Expenses: Not on file  Food Insecurity:   . Worried About Charity fundraiser in the Last Year: Not on file  . Ran Out of Food in the Last Year: Not on file  Transportation Needs:   . Lack of Transportation (Medical): Not on file  . Lack of Transportation (Non-Medical): Not on file  Physical Activity:   . Days of Exercise per Week: Not on file  . Minutes of Exercise per Session: Not on file  Stress:   . Feeling of Stress : Not on file  Social Connections:   . Frequency of Communication with Friends and Family: Not on file  . Frequency of Social Gatherings with Friends and Family: Not on file  . Attends Religious Services: Not on file  . Active Member of Clubs or Organizations: Not on file  . Attends Archivist Meetings: Not on file  . Marital Status: Not on file  Intimate Partner Violence:   . Fear of Current or Ex-Partner: Not on file  . Emotionally Abused: Not on file  . Physically Abused: Not on file  . Sexually Abused: Not on file    Family History  Problem Relation Age of Onset  . Coronary artery disease Brother   . Heart disease Father   . Lung cancer Father   . Kidney cancer Father   . Prostate cancer Father   . Arthritis Mother   . Lung cancer Mother   .  Dementia Mother        Unspecified type, not Alzheimer's disease    ROS: Problems with memory since Covid infection slowly improving but no fevers or chills, productive cough, hemoptysis, dysphasia, odynophagia, melena, hematochezia, dysuria, hematuria, rash, seizure activity, orthopnea, PND, pedal edema, claudication. Remaining systems are negative.  Physical Exam: Well-developed well-nourished in no acute distress.  Skin is warm and dry.  HEENT is normal.  Neck is supple.  Chest is clear to auscultation with normal expansion.  Cardiovascular exam is regular rate and rhythm.  Abdominal exam nontender or distended. No masses palpated. Extremities show no edema. neuro grossly intact  A/P  1 paroxysmal atrial fibrillation-patient is in sinus rhythm today.  We will continue apixaban.   2 hypertension-blood pressure controlled.  Continue present medical regimen.  3 obstructive sleep apnea-continue CPAP.  Kirk Ruths, MD

## 2019-07-15 DIAGNOSIS — Z23 Encounter for immunization: Secondary | ICD-10-CM | POA: Diagnosis not present

## 2019-07-17 DIAGNOSIS — H401131 Primary open-angle glaucoma, bilateral, mild stage: Secondary | ICD-10-CM | POA: Diagnosis not present

## 2019-07-17 DIAGNOSIS — H5340 Unspecified visual field defects: Secondary | ICD-10-CM | POA: Diagnosis not present

## 2019-07-17 DIAGNOSIS — H16103 Unspecified superficial keratitis, bilateral: Secondary | ICD-10-CM | POA: Diagnosis not present

## 2019-07-17 DIAGNOSIS — Z961 Presence of intraocular lens: Secondary | ICD-10-CM | POA: Diagnosis not present

## 2019-07-19 ENCOUNTER — Other Ambulatory Visit: Payer: Self-pay

## 2019-07-19 ENCOUNTER — Encounter: Payer: Self-pay | Admitting: Cardiology

## 2019-07-19 ENCOUNTER — Ambulatory Visit (INDEPENDENT_AMBULATORY_CARE_PROVIDER_SITE_OTHER): Payer: Medicare Other | Admitting: Cardiology

## 2019-07-19 VITALS — BP 128/54 | HR 61 | Ht 67.0 in | Wt 200.8 lb

## 2019-07-19 DIAGNOSIS — I10 Essential (primary) hypertension: Secondary | ICD-10-CM

## 2019-07-19 DIAGNOSIS — G4733 Obstructive sleep apnea (adult) (pediatric): Secondary | ICD-10-CM

## 2019-07-19 DIAGNOSIS — I480 Paroxysmal atrial fibrillation: Secondary | ICD-10-CM

## 2019-07-19 DIAGNOSIS — Z9989 Dependence on other enabling machines and devices: Secondary | ICD-10-CM | POA: Diagnosis not present

## 2019-07-19 NOTE — Patient Instructions (Signed)

## 2019-07-21 DIAGNOSIS — M25512 Pain in left shoulder: Secondary | ICD-10-CM | POA: Diagnosis not present

## 2019-07-21 DIAGNOSIS — M12812 Other specific arthropathies, not elsewhere classified, left shoulder: Secondary | ICD-10-CM | POA: Insufficient documentation

## 2019-07-21 DIAGNOSIS — M25812 Other specified joint disorders, left shoulder: Secondary | ICD-10-CM | POA: Diagnosis not present

## 2019-07-21 HISTORY — DX: Other specific arthropathies, not elsewhere classified, left shoulder: M12.812

## 2019-07-26 ENCOUNTER — Telehealth: Payer: Self-pay

## 2019-07-26 ENCOUNTER — Other Ambulatory Visit: Payer: Self-pay | Admitting: Internal Medicine

## 2019-07-26 MED ORDER — HYDROCHLOROTHIAZIDE 25 MG PO TABS: 25.0000 mg | ORAL_TABLET | Freq: Every day | ORAL | 1 refills | Status: DC

## 2019-07-26 NOTE — Telephone Encounter (Signed)
Refilled HCTZ Rx as requested

## 2019-08-08 ENCOUNTER — Other Ambulatory Visit: Payer: Self-pay

## 2019-08-08 ENCOUNTER — Ambulatory Visit (INDEPENDENT_AMBULATORY_CARE_PROVIDER_SITE_OTHER): Payer: Medicare Other | Admitting: Adult Health

## 2019-08-08 ENCOUNTER — Encounter: Payer: Self-pay | Admitting: Adult Health

## 2019-08-08 VITALS — BP 161/81 | HR 81 | Ht 67.0 in | Wt 198.0 lb

## 2019-08-08 DIAGNOSIS — F330 Major depressive disorder, recurrent, mild: Secondary | ICD-10-CM | POA: Diagnosis not present

## 2019-08-08 DIAGNOSIS — G4700 Insomnia, unspecified: Secondary | ICD-10-CM | POA: Diagnosis not present

## 2019-08-08 MED ORDER — BUPROPION HCL ER (SR) 100 MG PO TB12: 100.0000 mg | ORAL_TABLET | Freq: Every day | ORAL | 5 refills | Status: DC

## 2019-08-08 NOTE — Progress Notes (Signed)
[facility] MD/PA/NP Initial Note  08/08/2019 1:50 PM Jonathan [name]  MRN:  [id number]  Chief Complaint:   HPI:   Describes mood today as "ok". Pleasant. Mood symptoms - denies depression, anxiety, and irritability. Stating "I'm not really depressed". Also stating "I'm not quite where I want to be with my level of affect". Has never been someone with "euphoric" feelings. Stating "I can remember being this way my whole life". Cymbalta is helpful - "I don't want to change it". Had Covid-19 back in November. Stating "I didn't know if I would live through it". Hospitalized for a week and then released. Has experienced increased memory issues since the hospitalization. Wife also hospitalized with Covid - has recovered well. Stable interest and motivation. Taking medications as prescribed.  Energy levels stable. Active, does not have a regular exercise routine. Has a "YOceanographer. Retired.  Enjoys some usual interests and activities. Married. Lives with current wife for past 32 years. Married to first wife for 27 years - 2 children. Spending time with family. Appetite adequate. Weight fluctuates.  Sleeps better some nights than others. Slept 12 hours a few nights ago and then slept 6 hours last night. Does not have a regular sleep pattern. Using Sonata/Ambien with Melatonin. Focus and concentration difficulties - "memory all but gone". Completing tasks. Managing aspects of household - "no where near enough". Wife handicapped in one leg - "doing a great deal of care taking". Has a cleaning crew every 2 weeks.  Denies SI or HI. Denies AH or VH. Worked as a drug abuse counselor for 30 years. Several concussions over the years.  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  Previous medication trials: Started taking antidepressants for many years. Prozac, Lexapro  Visit Diagnosis: No diagnosis found.  Past Psychiatric History: Denies psychiatric hospitalization.  Past Medical History:  Past Medical History:   Diagnosis Date  . Atrial fibrillation (HCC)    a. paroxysmal, on Eliquis for anticoagulation  . BPH (benign prostatic hyperplasia)   . Bronchitis   . Cataract   . COPD (chronic obstructive pulmonary disease) (HCC)    2 liters O2 HS  . Depression   . Fatty tumor    waste and back  . Fibromyalgia   . GERD (gastroesophageal reflux disease)   . Glaucoma    bilateral eyes  . Gout   . Hereditary and idiopathic peripheral neuropathy 08/28/2015  . Hypertension   . Hypothyroidism   . Hypoxia   . Insomnia   . Osteoarthritis   . Osteoporosis   . Peripheral neuropathy   . Sleep apnea    uses cpap-add oxygen at night  . Spinal compression fracture (HCC) seventh vertebre  . Transient alteration of awareness 08/28/2015  . Type 2 diabetes mellitus ([place]) 03/08/2016    Past Surgical History:  Procedure Laterality Date  . APPENDECTOMY  age 78  . CARPAL TUNNEL RELEASE Right    early 2000s  . CATARACT EXTRACTION     bilateral  . CHOLECYSTECTOMY  age 62  . EYE SURGERY    . FOOT ARTHRODESIS Right 02/02/2013   Procedure: RIGHT HALLUX METATARSAL PHALANGEAL JOINT ARTHRODESIS ;  Surgeon: Wylene Simmer, MD;  Location: [facility];  Service: Orthopedics;  Laterality: Right;  . INGUINAL HERNIA REPAIR  age 53   rt side  . NASAL CONCHA BULLOSA RESECTION  age 77  . PROSTATE SURGERY    . SHOULDER ARTHROSCOPY W/ ROTATOR CUFF REPAIR Right    early 2000s  . tonsil    .  VASECTOMY  age 50    Family Psychiatric History: Denies any family history of mental illness.  Family History:  Family History  Problem Relation Age of Onset  . Coronary artery disease Brother   . Heart disease Father   . Lung cancer Father   . Kidney cancer Father   . Prostate cancer Father   . Arthritis Mother   . Lung cancer Mother   . Dementia Mother        Unspecified type, not Alzheimer's disease    Social History:  Social History   Socioeconomic History  . Marital status: Married    Spouse name: Not  on file  . Number of children: 2  . Years of education: 3  . Highest education level: Master's degree (e.g., MA, MS, MEng, MEd, MSW, MBA)  Occupational History  . Occupation: RETIRED    Employer: RETIRED  Tobacco Use  . Smoking status: Former Smoker    Packs/day: 2.00    Years: 10.00    Pack years: 20.00    Types: Cigarettes    Quit date: 07/18/1975    Years since quitting: 44.0  . Smokeless tobacco: Never Used  Substance and Sexual Activity  . Alcohol use: Yes    Comment: 1-2 drinks/day  . Drug use: No  . Sexual activity: Not Currently  Other Topics Concern  . Not on file  Social History Narrative  . Not on file   Social Determinants of Health   Financial Resource Strain:   . Difficulty of Paying Living Expenses: Not on file  Food Insecurity:   . Worried About Charity fundraiser in the Last Year: Not on file  . Ran Out of Food in the Last Year: Not on file  Transportation Needs:   . Lack of Transportation (Medical): Not on file  . Lack of Transportation (Non-Medical): Not on file  Physical Activity:   . Days of Exercise per Week: Not on file  . Minutes of Exercise per Session: Not on file  Stress:   . Feeling of Stress : Not on file  Social Connections:   . Frequency of Communication with Friends and Family: Not on file  . Frequency of Social Gatherings with Friends and Family: Not on file  . Attends Religious Services: Not on file  . Active Member of Clubs or Organizations: Not on file  . Attends Archivist Meetings: Not on file  . Marital Status: Not on file    Allergies:  Allergies  Allergen Reactions  . Other Other (See Comments)    DUST-Other reaction(s): Respiratory distress    Metabolic Disorder Labs: Lab Results  Component Value Date   HGBA1C 7.4 (H) 02/09/2019   MPG 134 03/08/2016   MPG 126 (H) 07/20/2011   No results found for: PROLACTIN Lab Results  Component Value Date   CHOL 148 02/09/2019   TRIG 61 05/07/2019   HDL 42.60  02/09/2019   CHOLHDL 3 02/09/2019   VLDL 53.4 (H) 02/09/2019   LDLCALC 66 11/11/2018   LDLCALC 38 10/14/2017   Lab Results  Component Value Date   TSH 1.702 05/08/2019   TSH 1.99 02/09/2019    Therapeutic Level Labs: No results found for: LITHIUM No results found for: VALPROATE No components found for:  CBMZ  Current Medications: Current Outpatient Medications  Medication Sig Dispense Refill  . acetaminophen (TYLENOL) 500 MG tablet Take 2 tablets (1,000 mg total) by mouth every 8 (eight) hours as needed. (Patient taking differently: Take 1,000  mg by mouth 2 (two) times daily. ) 360 tablet 1  . allopurinol (ZYLOPRIM) 300 MG tablet TAKE 1 TABLET BY MOUTH IN  THE EVENING 90 tablet 3  . apixaban (ELIQUIS) 5 MG TABS tablet Take 1 tablet (5 mg total) by mouth 2 (two) times daily. 180 tablet 1  . atorvastatin (LIPITOR) 20 MG tablet Take 1 tablet (20 mg total) by mouth daily. 90 tablet 1  . bimatoprost (LUMIGAN) 0.01 % SOLN 1 drop at bedtime.    . Biotin 5000 MCG TABS Take 5,000 mcg by mouth daily.    . blood glucose meter kit and supplies KIT Dispense based on patient and insurance preference. Use daily as directed. (FOR E11.9). 1 each 0  . calcium-vitamin D (OSCAL WITH D) 500-200 MG-UNIT tablet Take 1 tablet by mouth daily with breakfast.    . colchicine (COLCRYS) 0.6 MG tablet Take 0.6 mg by mouth daily as needed (flareups).     . diltiazem (CARDIZEM CD) 300 MG 24 hr capsule TAKE 1 CAPSULE BY MOUTH  DAILY (Patient taking differently: Take 300 mg by mouth daily. ) 90 capsule 3  . dorzolamidel-timolol (COSOPT) 22.3-6.8 MG/ML SOLN ophthalmic solution 1 drop 2 (two) times daily.    Marland Kitchen doxazosin (CARDURA) 8 MG tablet TAKE 1 TABLET BY MOUTH  DAILY. (Patient taking differently: Take 8 mg by mouth daily. ) 90 tablet 1  . DULoxetine (CYMBALTA) 30 MG capsule TAKE 2 CAPSULES BY MOUTH IN THE MORNING AND 1 CAPSULE  IN THE EVENING (Patient taking differently: Take 30-60 mg by mouth See admin  instructions. TAKE 2 CAPSULES BY MOUTH IN THE MORNING AND 1 CAPSULE  IN THE EVENING) 270 capsule 3  . glucose blood test strip Use to check blood sugar daily. E11.9 100 each 3  . hydrochlorothiazide (HYDRODIURIL) 25 MG tablet Take 1 tablet (25 mg total) by mouth daily. 90 tablet 1  . Lancets MISC Use to check blood sugars daily. E11.9 100 each 3  . levothyroxine (SYNTHROID) 112 MCG tablet TAKE 1 TABLET BY MOUTH EVERY DAY (Patient taking differently: Take 112 mcg by mouth daily before breakfast. ) 90 tablet 1  . magnesium oxide (MAG-OX) 400 MG tablet Take 400 mg by mouth daily.    . Melatonin 5 MG TABS Take 5 mg by mouth at bedtime.    . metFORMIN (GLUCOPHAGE XR) 750 MG 24 hr tablet Take 1 tablet (750 mg total) by mouth daily with supper. 90 tablet 1  . mometasone (NASONEX) 50 MCG/ACT nasal spray Place 2 sprays into the nose daily. 17 g 3  . Multiple Vitamin (MULTIVITAMIN) tablet Take 1 tablet by mouth daily.    . nitroGLYCERIN (NITROSTAT) 0.4 MG SL tablet Place 1 tablet (0.4 mg total) under the tongue every 5 (five) minutes as needed for chest pain. 90 tablet 3  . omeprazole (PRILOSEC) 20 MG capsule TAKE 1 CAPSULE BY MOUTH  DAILY 90 capsule 3  . oxyCODONE-acetaminophen (PERCOCET) 10-325 MG tablet Take 1 tablet by mouth every 8 (eight) hours as needed for pain.    . potassium chloride SA (K-DUR) 20 MEQ tablet Take 1 tablet (20 mEq total) by mouth daily. 90 tablet 3  . solifenacin (VESICARE) 5 MG tablet Take 5 mg by mouth daily.    . vitamin C (ASCORBIC ACID) 500 MG tablet Take 500 mg by mouth 2 (two) times daily.     Marland Kitchen VITAMIN D, CHOLECALCIFEROL, PO Take 25 mcg by mouth daily.     . zaleplon (SONATA) 10 MG  capsule Take 1 capsule (10 mg total) by mouth at bedtime. 30 capsule 0  . zolpidem (AMBIEN) 10 MG tablet TAKE 1 TABLET BY MOUTH AT BEDTIME AS NEEDED FOR SLEEP. 30 tablet 0   No current facility-administered medications for this visit.    Medication Side Effects: none  Orders placed this  visit:  No orders of the defined types were placed in this encounter.   Psychiatric Specialty Exam:  Review of Systems  Musculoskeletal: Negative for gait problem.  Neurological: Negative for tremors.  Psychiatric/Behavioral:       Please refer to HPI    There were no vitals taken for this visit.There is no height or weight on file to calculate BMI.  General Appearance: Neat and Well Groomed  Eye Contact:  Good  Speech:  Clear and Coherent and Normal Rate  Volume:  Normal  Mood:  Anxious, Depressed and Irritable  Affect:  Congruent  Thought Process:  Coherent and Descriptions of Associations: Intact  Orientation:  Full (Time, Place, and Person)  Thought Content: Logical   Suicidal Thoughts:  No  Homicidal Thoughts:  No  Memory:  WNL  Judgement:  Good  Insight:  Good  Psychomotor Activity:  Normal  Concentration:  Concentration: Good  Recall:  Good  Fund of Knowledge: Good  Language: Good  Assets:  Communication Skills Desire for Improvement Financial Resources/Insurance Housing Intimacy Leisure Time Physical Health Resilience Social Support Talents/Skills Transportation Vocational/Educational  ADL's:  Intact  Cognition: WNL  Prognosis:  Good   Screenings:  Mini-Mental     Clinical Support from 04/20/2018 in [facility] Office Visit from 08/28/2015 in [facility]  Total Score (max 30 points )  29  29    PHQ2-9     Clinical Support from 04/26/2019 in [facility] from 04/20/2018 in [facility] from 04/19/2017 in [facility] from 01/14/2017 in [facility] at [facility] from 01/12/2017 in [facility] at [facility] Total Score  2  4  2   0  0  PHQ-9 Total Score  7  9  8   --  --      Receiving Psychotherapy: No   Treatment Plan/Recommendations:   Plan:  PDMP reviewed  1. Cymbalta 45m  BID - PCP currently prescribing.  2. Add Wellbutrin SR 1025mdaily - denies seizure history  RTC 4 weeks  Patient advised to contact office with any questions, adverse effects, or acute worsening in signs and symptoms.  ReAloha GellNP

## 2019-08-12 DIAGNOSIS — Z23 Encounter for immunization: Secondary | ICD-10-CM | POA: Diagnosis not present

## 2019-08-13 NOTE — Patient Instructions (Addendum)
  Blood work was ordered.   ° ° °Medications reviewed and updated.  Changes include :   none ° ° ° °Please followup in 6 months ° ° °

## 2019-08-13 NOTE — Progress Notes (Signed)
Subjective:    Patient ID: Jonathan [name], male    DOB: [birth year], 86 y.o.   MRN: [id number]  HPI The patient is here for follow up of their chronic medical problems, including diabetes, atrial fibrillation, hypertension, insomnia, hypothyroidism, hyperlipidemia, gout, chronic back pain, knee pain, GERD, depression and memory difficulties.  He is taking all of his medications daily as prescribed.  He is not exercising regularly  I referred him to psychiatry when he was here in January.  He saw her and was started on wellbutrin.  He feels overall his depression is better and is doing okay.   His sleep is erratic.  2-3 nights ago he slept a lot - 12 hrs and napped during the day.  The next night he slept 6 hrs. he is fairly sedentary during the day.     Medications and allergies reviewed with patient and updated if appropriate.  Patient Active Problem List   Diagnosis Date Noted  . Rotator cuff arthropathy of left shoulder 07/21/2019  . Dupuytren's disease of palm 07/12/2019  . Sebaceous cyst 06/27/2019  . Atrial fibrillation with rapid ventricular response ([place]) 05/08/2019  . COVID-19 05/08/2019  . Acute encephalopathy 05/08/2019  . Paroxysmal atrial fibrillation with RVR ([place]) 05/07/2019  . Obstructive sleep apnea   . Asymmetrical left sensorineural hearing loss 07/12/2018  . Acute cystitis without hematuria 05/18/2018  . Chronic anticoagulation 12/21/2017  . Compression fracture of thoracic vertebra ([place]) 09/21/2017  . Degeneration of thoracic intervertebral disc 09/21/2017  . Thoracic back pain 08/10/2017  . Degenerative joint disease of hand 07/14/2017  . Osteoarthritis   . Hypoxia   . Fibromyalgia   . Fatty tumor   . Cataract   . BPH (benign prostatic hyperplasia)   . PAF (paroxysmal atrial fibrillation) ([place])   . Insomnia 03/10/2017  . Chronic pain, legs and back 03/10/2017  . Hyperlipidemia 03/10/2017  . Obesity (BMI 30.0-34.9) 12/08/2016  . Depression  09/07/2016  . Spondylolisthesis 09/07/2016  . Hypothyroidism 03/08/2016  . GERD (gastroesophageal reflux disease) 03/08/2016  . Gout 03/08/2016  . Glaucoma 03/08/2016  . Type 2 diabetes mellitus ([place]) 03/08/2016  . Hereditary and idiopathic peripheral neuropathy 08/28/2015  . Essential hypertension 12/04/2011  . Allergic rhinitis, seasonal 12/04/2011  . COPD (chronic obstructive pulmonary disease) ([place]) 07/18/2011    Current Outpatient Medications on File Prior to Visit  Medication Sig Dispense Refill  . acetaminophen (TYLENOL) 500 MG tablet Take 2 tablets (1,000 mg total) by mouth every 8 (eight) hours as needed. (Patient taking differently: Take 650 mg by mouth 2 (two) times daily. ) 360 tablet 1  . allopurinol (ZYLOPRIM) 300 MG tablet TAKE 1 TABLET BY MOUTH IN  THE EVENING 90 tablet 3  . apixaban (ELIQUIS) 5 MG TABS tablet Take 1 tablet (5 mg total) by mouth 2 (two) times daily. 180 tablet 1  . atorvastatin (LIPITOR) 20 MG tablet Take 1 tablet (20 mg total) by mouth daily. 90 tablet 1  . bimatoprost (LUMIGAN) 0.01 % SOLN 1 drop at bedtime.    . Biotin 5000 MCG TABS Take 5,000 mcg by mouth daily.    . blood glucose meter kit and supplies KIT Dispense based on patient and insurance preference. Use daily as directed. (FOR E11.9). 1 each 0  . buPROPion (WELLBUTRIN SR) 100 MG 12 hr tablet Take 1 tablet (100 mg total) by mouth daily. 30 tablet 5  . calcium-vitamin D (OSCAL WITH D) 500-200 MG-UNIT tablet Take 1 tablet by mouth daily with breakfast.    .  colchicine (COLCRYS) 0.6 MG tablet Take 0.6 mg by mouth daily as needed (flareups).     . diltiazem (CARDIZEM CD) 300 MG 24 hr capsule TAKE 1 CAPSULE BY MOUTH  DAILY (Patient taking differently: Take 300 mg by mouth daily. ) 90 capsule 3  . dorzolamide-timolol (COSOPT) 22.3-6.8 MG/ML ophthalmic solution Place 1 drop into the left eye 2 (two) times daily.    Marland Kitchen doxazosin (CARDURA) 8 MG tablet TAKE 1 TABLET BY MOUTH  DAILY. (Patient taking  differently: Take 8 mg by mouth daily. ) 90 tablet 1  . DULoxetine (CYMBALTA) 30 MG capsule TAKE 2 CAPSULES BY MOUTH IN THE MORNING AND 1 CAPSULE  IN THE EVENING (Patient taking differently: Take 60 mg by mouth See admin instructions. TAKE 2 CAPSULES BY MOUTH IN THE MORNING AND 2 CAPSULES  IN THE EVENING) 270 capsule 3  . glucose blood test strip Use to check blood sugar daily. E11.9 100 each 3  . hydrochlorothiazide (HYDRODIURIL) 25 MG tablet Take 1 tablet (25 mg total) by mouth daily. 90 tablet 1  . Lancets MISC Use to check blood sugars daily. E11.9 100 each 3  . levothyroxine (SYNTHROID) 112 MCG tablet TAKE 1 TABLET BY MOUTH EVERY DAY (Patient taking differently: Take 112 mcg by mouth daily before breakfast. ) 90 tablet 1  . magnesium oxide (MAG-OX) 400 MG tablet Take 400 mg by mouth daily.    . Melatonin 5 MG TABS Take 5 mg by mouth at bedtime.    . metFORMIN (GLUCOPHAGE XR) 750 MG 24 hr tablet Take 1 tablet (750 mg total) by mouth daily with supper. 90 tablet 1  . mometasone (NASONEX) 50 MCG/ACT nasal spray Place 2 sprays into the nose daily. 17 g 3  . Multiple Vitamin (MULTIVITAMIN) tablet Take 1 tablet by mouth daily.    . nitroGLYCERIN (NITROSTAT) 0.4 MG SL tablet Place 1 tablet (0.4 mg total) under the tongue every 5 (five) minutes as needed for chest pain. 90 tablet 3  . omeprazole (PRILOSEC) 20 MG capsule TAKE 1 CAPSULE BY MOUTH  DAILY 90 capsule 3  . oxyCODONE-acetaminophen (PERCOCET) 10-325 MG tablet Take 1 tablet by mouth every 8 (eight) hours as needed for pain.    . potassium chloride SA (K-DUR) 20 MEQ tablet Take 1 tablet (20 mEq total) by mouth daily. 90 tablet 3  . solifenacin (VESICARE) 5 MG tablet Take 5 mg by mouth daily.    . vitamin C (ASCORBIC ACID) 500 MG tablet Take 500 mg by mouth 2 (two) times daily.     Marland Kitchen VITAMIN D, CHOLECALCIFEROL, PO Take 25 mcg by mouth daily.     . zaleplon (SONATA) 10 MG capsule Take 1 capsule (10 mg total) by mouth at bedtime. 30 capsule 0  .  zolpidem (AMBIEN) 10 MG tablet TAKE 1 TABLET BY MOUTH AT BEDTIME AS NEEDED FOR SLEEP. 30 tablet 0   No current facility-administered medications on file prior to visit.    Past Medical History:  Diagnosis Date  . Atrial fibrillation (HCC)    a. paroxysmal, on Eliquis for anticoagulation  . BPH (benign prostatic hyperplasia)   . Bronchitis   . Cataract   . COPD (chronic obstructive pulmonary disease) (HCC)    2 liters O2 HS  . Depression   . Fatty tumor    waste and back  . Fibromyalgia   . GERD (gastroesophageal reflux disease)   . Glaucoma    bilateral eyes  . Gout   . Hereditary and idiopathic  peripheral neuropathy 08/28/2015  . Hypertension   . Hypothyroidism   . Hypoxia   . Insomnia   . Osteoarthritis   . Osteoporosis   . Peripheral neuropathy   . Sleep apnea    uses cpap-add oxygen at night  . Spinal compression fracture (HCC) seventh vertebre  . Transient alteration of awareness 08/28/2015  . Type 2 diabetes mellitus ([place]) 03/08/2016    Past Surgical History:  Procedure Laterality Date  . APPENDECTOMY  age 89  . CARPAL TUNNEL RELEASE Right    early 2000s  . CATARACT EXTRACTION     bilateral  . CHOLECYSTECTOMY  age 86  . EYE SURGERY    . FOOT ARTHRODESIS Right 02/02/2013   Procedure: RIGHT HALLUX METATARSAL PHALANGEAL JOINT ARTHRODESIS ;  Surgeon: Wylene Simmer, MD;  Location: [facility];  Service: Orthopedics;  Laterality: Right;  . INGUINAL HERNIA REPAIR  age 72   rt side  . NASAL CONCHA BULLOSA RESECTION  age 19  . PROSTATE SURGERY    . SHOULDER ARTHROSCOPY W/ ROTATOR CUFF REPAIR Right    early 2000s  . tonsil    . VASECTOMY  age 14    Social History   Socioeconomic History  . Marital status: Married    Spouse name: Not on file  . Number of children: 2  . Years of education: 38  . Highest education level: Master's degree (e.g., MA, MS, MEng, MEd, MSW, MBA)  Occupational History  . Occupation: RETIRED    Employer: RETIRED  Tobacco  Use  . Smoking status: Former Smoker    Packs/day: 2.00    Years: 10.00    Pack years: 20.00    Types: Cigarettes    Quit date: 07/18/1975    Years since quitting: 44.1  . Smokeless tobacco: Never Used  Substance and Sexual Activity  . Alcohol use: Yes    Comment: 1-2 drinks/day  . Drug use: No  . Sexual activity: Not Currently  Other Topics Concern  . Not on file  Social History Narrative  . Not on file   Social Determinants of Health   Financial Resource Strain:   . Difficulty of Paying Living Expenses: Not on file  Food Insecurity:   . Worried About Charity fundraiser in the Last Year: Not on file  . Ran Out of Food in the Last Year: Not on file  Transportation Needs:   . Lack of Transportation (Medical): Not on file  . Lack of Transportation (Non-Medical): Not on file  Physical Activity:   . Days of Exercise per Week: Not on file  . Minutes of Exercise per Session: Not on file  Stress:   . Feeling of Stress : Not on file  Social Connections:   . Frequency of Communication with Friends and Family: Not on file  . Frequency of Social Gatherings with Friends and Family: Not on file  . Attends Religious Services: Not on file  . Active Member of Clubs or Organizations: Not on file  . Attends Archivist Meetings: Not on file  . Marital Status: Not on file    Family History  Problem Relation Age of Onset  . Coronary artery disease Brother   . Heart disease Father   . Lung cancer Father   . Kidney cancer Father   . Prostate cancer Father   . Arthritis Mother   . Lung cancer Mother   . Dementia Mother        Unspecified type, not Alzheimer's  disease    Review of Systems  Constitutional: Negative for chills and fever.  Respiratory: Negative for cough, shortness of breath and wheezing.   Cardiovascular: Positive for leg swelling (mild). Negative for chest pain and palpitations.  Neurological: Negative for light-headedness and headaches.   Psychiatric/Behavioral: Positive for dysphoric mood and sleep disturbance.       Objective:   Vitals:   08/14/19 0947  BP: 140/72  Pulse: (!) 59  Resp: 16  Temp: 97.9 F (36.6 C)  SpO2: 97%   BP Readings from Last 3 Encounters:  08/14/19 140/72  08/08/19 (!) 161/81  07/19/19 (!) 128/54   Wt Readings from Last 3 Encounters:  08/14/19 205 lb 12.8 oz (93.4 kg)  08/08/19 198 lb (89.8 kg)  07/19/19 200 lb 12.8 oz (91.1 kg)   Body mass index is 32.23 kg/m.   Physical Exam    Constitutional: Appears well-developed and well-nourished. No distress.  HENT:  Head: Normocephalic and atraumatic.  Neck: Neck supple. No tracheal deviation present. No thyromegaly present.  No cervical lymphadenopathy Cardiovascular: Normal rate, regular rhythm and normal heart sounds.   No murmur heard. No carotid bruit .  No edema Pulmonary/Chest: Effort normal and breath sounds normal. No respiratory distress. No has no wheezes. No rales.  Skin: Skin is warm and dry. Not diaphoretic.  Psychiatric: Normal mood and affect. Behavior is normal.      Assessment & Plan:    See Problem List for Assessment and Plan of chronic medical problems.    This visit occurred during the SARS-CoV-2 public health emergency.  Safety protocols were in place, including screening questions prior to the visit, additional usage of staff PPE, and extensive cleaning of exam room while observing appropriate contact time as indicated for disinfecting solutions.

## 2019-08-14 ENCOUNTER — Encounter: Payer: Self-pay | Admitting: Internal Medicine

## 2019-08-14 ENCOUNTER — Other Ambulatory Visit: Payer: Self-pay

## 2019-08-14 ENCOUNTER — Ambulatory Visit (INDEPENDENT_AMBULATORY_CARE_PROVIDER_SITE_OTHER): Payer: Medicare Other | Admitting: Internal Medicine

## 2019-08-14 VITALS — BP 140/72 | HR 59 | Temp 97.9°F | Resp 16 | Ht 67.0 in | Wt 205.8 lb

## 2019-08-14 DIAGNOSIS — G4700 Insomnia, unspecified: Secondary | ICD-10-CM

## 2019-08-14 DIAGNOSIS — E039 Hypothyroidism, unspecified: Secondary | ICD-10-CM

## 2019-08-14 DIAGNOSIS — I480 Paroxysmal atrial fibrillation: Secondary | ICD-10-CM | POA: Diagnosis not present

## 2019-08-14 DIAGNOSIS — E1165 Type 2 diabetes mellitus with hyperglycemia: Secondary | ICD-10-CM

## 2019-08-14 DIAGNOSIS — I10 Essential (primary) hypertension: Secondary | ICD-10-CM | POA: Diagnosis not present

## 2019-08-14 DIAGNOSIS — F330 Major depressive disorder, recurrent, mild: Secondary | ICD-10-CM | POA: Diagnosis not present

## 2019-08-14 DIAGNOSIS — E782 Mixed hyperlipidemia: Secondary | ICD-10-CM | POA: Diagnosis not present

## 2019-08-14 LAB — CBC WITH DIFFERENTIAL/PLATELET
Basophils Absolute: 0 10*3/uL (ref 0.0–0.1)
Basophils Relative: 0.3 % (ref 0.0–3.0)
Eosinophils Absolute: 0 10*3/uL (ref 0.0–0.7)
Eosinophils Relative: 0.5 % (ref 0.0–5.0)
HCT: 37.5 % — ABNORMAL LOW (ref 39.0–52.0)
Hemoglobin: 12.5 g/dL — ABNORMAL LOW (ref 13.0–17.0)
Lymphocytes Relative: 27 % (ref 12.0–46.0)
Lymphs Abs: 1.3 10*3/uL (ref 0.7–4.0)
MCHC: 33.2 g/dL (ref 30.0–36.0)
MCV: 91.6 fl (ref 78.0–100.0)
Monocytes Absolute: 1.3 10*3/uL — ABNORMAL HIGH (ref 0.1–1.0)
Monocytes Relative: 25.3 % — ABNORMAL HIGH (ref 3.0–12.0)
Neutro Abs: 2.3 10*3/uL (ref 1.4–7.7)
Neutrophils Relative %: 46.9 % (ref 43.0–77.0)
Platelets: 111 10*3/uL — ABNORMAL LOW (ref 150.0–400.0)
RBC: 4.1 Mil/uL — ABNORMAL LOW (ref 4.22–5.81)
RDW: 14.6 % (ref 11.5–15.5)
WBC: 4.9 10*3/uL (ref 4.0–10.5)

## 2019-08-14 LAB — COMPREHENSIVE METABOLIC PANEL
ALT: 17 U/L (ref 0–53)
AST: 17 U/L (ref 0–37)
Albumin: 3.9 g/dL (ref 3.5–5.2)
Alkaline Phosphatase: 87 U/L (ref 39–117)
BUN: 13 mg/dL (ref 6–23)
CO2: 30 mEq/L (ref 19–32)
Calcium: 9.1 mg/dL (ref 8.4–10.5)
Chloride: 98 mEq/L (ref 96–112)
Creatinine, Ser: 1 mg/dL (ref 0.40–1.50)
GFR: 71.16 mL/min (ref >60.00)
Glucose, Bld: 269 mg/dL — ABNORMAL HIGH (ref 70–99)
Potassium: 4.1 mEq/L (ref 3.5–5.1)
Sodium: 137 mEq/L (ref 135–145)
Total Bilirubin: 0.3 mg/dL (ref 0.2–1.2)
Total Protein: 6.2 g/dL (ref 6.0–8.3)

## 2019-08-14 LAB — LDL CHOLESTEROL, DIRECT: Direct LDL: 51 mg/dL

## 2019-08-14 LAB — LIPID PANEL
Cholesterol: 111 mg/dL (ref 0–200)
HDL: 37.6 mg/dL — ABNORMAL LOW (ref >39.00)
NonHDL: 72.92
Total CHOL/HDL Ratio: 3
Triglycerides: 211 mg/dL — ABNORMAL HIGH (ref 0.0–149.0)
VLDL: 42.2 mg/dL — ABNORMAL HIGH (ref 0.0–40.0)

## 2019-08-14 LAB — TSH: TSH: 3.13 u[IU]/mL (ref 0.35–4.50)

## 2019-08-14 LAB — HEMOGLOBIN A1C: Hgb A1c MFr Bld: 8 % — ABNORMAL HIGH (ref 4.6–6.5)

## 2019-08-14 NOTE — Assessment & Plan Note (Signed)
Chronic Following with cardiology On Eliquis, Cardizem Asymptomatic CBC, CMP

## 2019-08-14 NOTE — Assessment & Plan Note (Signed)
Chronic Check A1c Continue Metformin at current dose

## 2019-08-14 NOTE — Assessment & Plan Note (Signed)
Chronic  Clinically euthyroid Check tsh  Titrate med dose if needed  

## 2019-08-14 NOTE — Assessment & Plan Note (Signed)
Chronic Alternating Ambien and Sonata-we will continue alternating since that is what works best for him, but advised he can only have 1 prescription at a time

## 2019-08-14 NOTE — Assessment & Plan Note (Signed)
Chronic Did establish with psychiatry and Wellbutrin was added Still on Cymbalta Seems to be doing better today Follow-up in 6 months

## 2019-08-14 NOTE — Assessment & Plan Note (Signed)
Chronic Check lipid panel  Continue daily statin Regular exercise and healthy diet encouraged  

## 2019-08-14 NOTE — Assessment & Plan Note (Signed)
Chronic BP overall controlled Current regimen effective and well tolerated Continue current medications at current doses cmp

## 2019-08-15 ENCOUNTER — Encounter: Payer: Self-pay | Admitting: Internal Medicine

## 2019-08-24 ENCOUNTER — Other Ambulatory Visit: Payer: Self-pay | Admitting: Internal Medicine

## 2019-08-25 ENCOUNTER — Other Ambulatory Visit: Payer: Self-pay | Admitting: Internal Medicine

## 2019-08-26 ENCOUNTER — Other Ambulatory Visit: Payer: Self-pay | Admitting: Internal Medicine

## 2019-08-29 MED ORDER — ZALEPLON 10 MG PO CAPS: 10.0000 mg | ORAL_CAPSULE | Freq: Every day | ORAL | 0 refills | Status: DC

## 2019-08-29 NOTE — Telephone Encounter (Signed)
Last OV 08/14/19 Next OV NA Last RF 07/08/19

## 2019-08-30 ENCOUNTER — Telehealth: Payer: Self-pay | Admitting: Adult Health

## 2019-08-30 ENCOUNTER — Other Ambulatory Visit: Payer: Self-pay | Admitting: Adult Health

## 2019-08-30 DIAGNOSIS — H16222 Keratoconjunctivitis sicca, not specified as Sjogren's, left eye: Secondary | ICD-10-CM | POA: Diagnosis not present

## 2019-08-30 DIAGNOSIS — F330 Major depressive disorder, recurrent, mild: Secondary | ICD-10-CM

## 2019-08-30 DIAGNOSIS — H16221 Keratoconjunctivitis sicca, not specified as Sjogren's, right eye: Secondary | ICD-10-CM | POA: Diagnosis not present

## 2019-08-30 DIAGNOSIS — H401132 Primary open-angle glaucoma, bilateral, moderate stage: Secondary | ICD-10-CM | POA: Diagnosis not present

## 2019-08-30 DIAGNOSIS — H52203 Unspecified astigmatism, bilateral: Secondary | ICD-10-CM | POA: Diagnosis not present

## 2019-08-30 NOTE — Telephone Encounter (Signed)
Jonathan [name] called to report that the bupropion is not working.  He has felt no change in how he feels and his wife has not noticed any change either.   Please call to discuss possible change in dose or change of medication.  Appt 3/30.  [facility] [address].

## 2019-08-31 ENCOUNTER — Other Ambulatory Visit: Payer: Self-pay

## 2019-08-31 MED ORDER — BUPROPION HCL ER (SR) 100 MG PO TB12: 100.0000 mg | ORAL_TABLET | Freq: Two times a day (BID) | ORAL | 5 refills | Status: DC

## 2019-08-31 NOTE — Telephone Encounter (Signed)
Left patient detailed message and to call back with further questions. Updated Rx submitted

## 2019-09-08 ENCOUNTER — Other Ambulatory Visit: Payer: Self-pay | Admitting: Adult Health

## 2019-09-08 DIAGNOSIS — F330 Major depressive disorder, recurrent, mild: Secondary | ICD-10-CM

## 2019-09-12 ENCOUNTER — Other Ambulatory Visit: Payer: Self-pay

## 2019-09-12 ENCOUNTER — Ambulatory Visit (INDEPENDENT_AMBULATORY_CARE_PROVIDER_SITE_OTHER): Payer: Medicare Other | Admitting: Adult Health

## 2019-09-12 ENCOUNTER — Encounter: Payer: Self-pay | Admitting: Adult Health

## 2019-09-12 ENCOUNTER — Encounter: Payer: Self-pay | Admitting: Internal Medicine

## 2019-09-12 DIAGNOSIS — F330 Major depressive disorder, recurrent, mild: Secondary | ICD-10-CM

## 2019-09-12 DIAGNOSIS — F09 Unspecified mental disorder due to known physiological condition: Secondary | ICD-10-CM | POA: Diagnosis not present

## 2019-09-12 NOTE — Progress Notes (Deleted)
Virtual Visit via Video Note  I connected with Jonathan [name] on 09/12/19 at  3:00 PM EDT by a video enabled telemedicine application and verified that I am speaking with the correct person using two identifiers.   I discussed the limitations of evaluation and management by telemedicine and the availability of in person appointments. The patient expressed understanding and agreed to proceed.  Present for the visit:  Myself, Dr Billey Gosling, Jonathan [name].  The patient is currently at home and I am in the office.    No referring provider.    History of Present Illness: This is an acute visit for nausea     Social History   Socioeconomic History  . Marital status: Married    Spouse name: Not on file  . Number of children: 2  . Years of education: 71  . Highest education level: Master's degree (e.g., MA, MS, MEng, MEd, MSW, MBA)  Occupational History  . Occupation: RETIRED    Employer: RETIRED  Tobacco Use  . Smoking status: Former Smoker    Packs/day: 2.00    Years: 10.00    Pack years: 20.00    Types: Cigarettes    Quit date: 07/18/1975    Years since quitting: 44.1  . Smokeless tobacco: Never Used  Substance and Sexual Activity  . Alcohol use: Yes    Comment: 1-2 drinks/day  . Drug use: No  . Sexual activity: Not Currently  Other Topics Concern  . Not on file  Social History Narrative  . Not on file   Social Determinants of Health   Financial Resource Strain:   . Difficulty of Paying Living Expenses:   Food Insecurity:   . Worried About Charity fundraiser in the Last Year:   . Arboriculturist in the Last Year:   Transportation Needs:   . Film/video editor (Medical):   Marland Kitchen Lack of Transportation (Non-Medical):   Physical Activity:   . Days of Exercise per Week:   . Minutes of Exercise per Session:   Stress:   . Feeling of Stress :   Social Connections:   . Frequency of Communication with Friends and Family:   . Frequency of Social Gatherings with  Friends and Family:   . Attends Religious Services:   . Active Member of Clubs or Organizations:   . Attends Archivist Meetings:   Marland Kitchen Marital Status:      Observations/Objective: Appears well in NAD   Assessment and Plan:  See Problem List for Assessment and Plan of chronic medical problems.   Follow Up Instructions:    I discussed the assessment and treatment plan with the patient. The patient was provided an opportunity to ask questions and all were answered. The patient agreed with the plan and demonstrated an understanding of the instructions.   The patient was advised to call back or seek an in-person evaluation if the symptoms worsen or if the condition fails to improve as anticipated.    Binnie Rail, MD

## 2019-09-12 NOTE — Progress Notes (Signed)
Jonathan [name] [id number] 08/11/1932 86 y.o.  Subjective:   Patient ID:  Jonathan [name] is a 86 y.o. (DOB 08/09/1932) male.  Chief Complaint: No chief complaint on file.   HPI Jonathan [name] presents to the office today for follow-up of MDD and cognitive disorder.   Describes mood today as "ok". Pleasant. Mood symptoms - denies depression, anxiety, and irritability. Stating "I just feel blah". Stating "I don't have a lot to do, but I feel overwhelmed". Notes "affect" has not changed "so far" with addition of Wellbutrin. Has been up to 100 mg bid for a week. Feels like he continues to have side effects related to Covid - vision, concentration, ability to do simple things". Concerned about having Dementia. Stable interest and motivation. Taking medications as prescribed.  Energy levels low. Active, does not have a regular exercise routine. Retired.  Enjoys some usual interests and activities. Stating "I would like to have more interests". Married. Lives with current wife for past 32 years. Married to first wife for 27 years - 2 children. Spending time with family. Appetite adequate up until last few days. Weight stable. Sleeps well most nights. Averages 10 hours a night. Napping 2 hours a day.  Focus and concentration difficulties. Completing tasks - "I have a heck of a time finishing tasks". Managing aspects of household. Preparing meals. Wife handicapped in one leg. Has a cleaning crew every 2 weeks.  Denies SI or HI. Denies AH or VH.  Worked as a drug abuse counselor for 30 years.  Several concussions over the years.   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  Previous medication trials: Started taking antidepressants for many years. Prozac, Lexapro  GAD-7     Office Visit from 08/14/2019 in [place] at [facility]  Total GAD-7 Score  3    Mini-Mental     Clinical Support from 04/20/2018 in [place] Visit from 08/28/2015 in [facility]  Total Score (max 30 points )  29  29    PHQ2-9     Office Visit from 08/14/2019 in [place] [facility] [place] from 04/26/2019 in [place] from 04/20/2018 in [place] from 04/19/2017 in [place] Visit from 01/14/2017 in [facility] at [facility] Total Score  4  2  4  2   0  PHQ-9 Total Score  16  7  9  8   --       Review of Systems:  Review of Systems  Musculoskeletal: Negative for gait problem.  Neurological: Negative for tremors.  Psychiatric/Behavioral:       Please refer to HPI    Medications: I have reviewed the patient's current medications.  Current Outpatient Medications  Medication Sig Dispense Refill  . acetaminophen (TYLENOL) 500 MG tablet Take 2 tablets (1,000 mg total) by mouth every 8 (eight) hours as needed. (Patient taking differently: Take 650 mg by mouth 2 (two) times daily. ) 360 tablet 1  . allopurinol (ZYLOPRIM) 300 MG tablet TAKE 1 TABLET BY MOUTH IN  THE EVENING 90 tablet 3  . apixaban (ELIQUIS) 5 MG TABS tablet Take 1 tablet (5 mg total) by mouth 2 (two) times daily. 180 tablet 1  . atorvastatin (LIPITOR) 20 MG tablet Take 1 tablet (20 mg total) by mouth daily. 90 tablet 1  . bimatoprost (LUMIGAN) 0.01 % SOLN 1 drop at bedtime.    . Biotin  5000 MCG TABS Take 5,000 mcg by mouth daily.    . blood glucose meter kit and supplies KIT Dispense based on patient and insurance preference. Use daily as directed. (FOR E11.9). 1 each 0  . buPROPion (WELLBUTRIN SR) 100 MG 12 hr tablet TAKE 1 TABLET BY MOUTH TWICE A DAY 180 tablet 1  . calcium-vitamin D (OSCAL WITH D) 500-200 MG-UNIT tablet Take 1 tablet by mouth daily with breakfast.    . colchicine (COLCRYS) 0.6 MG tablet Take 0.6 mg by mouth daily as needed (flareups).     . diltiazem (CARDIZEM CD) 300 MG 24 hr capsule TAKE 1 CAPSULE BY MOUTH  DAILY (Patient  taking differently: Take 300 mg by mouth daily. ) 90 capsule 3  . dorzolamide-timolol (COSOPT) 22.3-6.8 MG/ML ophthalmic solution Place 1 drop into the left eye 2 (two) times daily.    Marland Kitchen doxazosin (CARDURA) 8 MG tablet TAKE 1 TABLET BY MOUTH  DAILY. (Patient taking differently: Take 8 mg by mouth daily. ) 90 tablet 1  . DULoxetine (CYMBALTA) 30 MG capsule TAKE 2 CAPSULES BY MOUTH IN THE MORNING AND 1 CAPSULE  IN THE EVENING (Patient taking differently: Take 60 mg by mouth See admin instructions. TAKE 2 CAPSULES BY MOUTH IN THE MORNING AND 2 CAPSULES  IN THE EVENING) 270 capsule 3  . glucose blood test strip Use to check blood sugar daily. E11.9 100 each 3  . hydrochlorothiazide (HYDRODIURIL) 25 MG tablet Take 1 tablet (25 mg total) by mouth daily. 90 tablet 1  . Lancets MISC Use to check blood sugars daily. E11.9 100 each 3  . levothyroxine (SYNTHROID) 112 MCG tablet TAKE 1 TABLET BY MOUTH EVERY DAY (Patient taking differently: Take 112 mcg by mouth daily before breakfast. ) 90 tablet 1  . magnesium oxide (MAG-OX) 400 MG tablet Take 400 mg by mouth daily.    . Melatonin 5 MG TABS Take 5 mg by mouth at bedtime.    . metFORMIN (GLUCOPHAGE-XR) 750 MG 24 hr tablet TAKE 1 TABLET (750 MG TOTAL) BY MOUTH DAILY WITH SUPPER. 90 tablet 1  . mometasone (NASONEX) 50 MCG/ACT nasal spray Place 2 sprays into the nose daily. 17 g 3  . Multiple Vitamin (MULTIVITAMIN) tablet Take 1 tablet by mouth daily.    . nitroGLYCERIN (NITROSTAT) 0.4 MG SL tablet Place 1 tablet (0.4 mg total) under the tongue every 5 (five) minutes as needed for chest pain. 90 tablet 3  . omeprazole (PRILOSEC) 20 MG capsule TAKE 1 CAPSULE BY MOUTH  DAILY 90 capsule 3  . oxyCODONE-acetaminophen (PERCOCET) 10-325 MG tablet Take 1 tablet by mouth every 8 (eight) hours as needed for pain.    . potassium chloride SA (K-DUR) 20 MEQ tablet Take 1 tablet (20 mEq total) by mouth daily. 90 tablet 3  . solifenacin (VESICARE) 5 MG tablet Take 5 mg by mouth  daily.    . vitamin C (ASCORBIC ACID) 500 MG tablet Take 500 mg by mouth 2 (two) times daily.     Marland Kitchen VITAMIN D, CHOLECALCIFEROL, PO Take 25 mcg by mouth daily.     . zaleplon (SONATA) 10 MG capsule Take 1 capsule (10 mg total) by mouth at bedtime. 30 capsule 0  . zolpidem (AMBIEN) 10 MG tablet TAKE 1 TABLET BY MOUTH AT BEDTIME AS NEEDED FOR SLEEP. 30 tablet 0   No current facility-administered medications for this visit.    Medication Side Effects: None  Allergies:  Allergies  Allergen Reactions  . Other Other (See  Comments)    DUST-Other reaction(s): Respiratory distress    Past Medical History:  Diagnosis Date  . Atrial fibrillation (HCC)    a. paroxysmal, on Eliquis for anticoagulation  . BPH (benign prostatic hyperplasia)   . Bronchitis   . Cataract   . COPD (chronic obstructive pulmonary disease) (HCC)    2 liters O2 HS  . Depression   . Fatty tumor    waste and back  . Fibromyalgia   . GERD (gastroesophageal reflux disease)   . Glaucoma    bilateral eyes  . Gout   . Hereditary and idiopathic peripheral neuropathy 08/28/2015  . Hypertension   . Hypothyroidism   . Hypoxia   . Insomnia   . Osteoarthritis   . Osteoporosis   . Peripheral neuropathy   . Sleep apnea    uses cpap-add oxygen at night  . Spinal compression fracture (HCC) seventh vertebre  . Transient alteration of awareness 08/28/2015  . Type 2 diabetes mellitus ([place]) 03/08/2016    Family History  Problem Relation Age of Onset  . Coronary artery disease Brother   . Heart disease Father   . Lung cancer Father   . Kidney cancer Father   . Prostate cancer Father   . Arthritis Mother   . Lung cancer Mother   . Dementia Mother        Unspecified type, not Alzheimer's disease    Social History   Socioeconomic History  . Marital status: Married    Spouse name: Not on file  . Number of children: 2  . Years of education: 23  . Highest education level: Master's degree (e.g., MA, MS, MEng, MEd, MSW,  MBA)  Occupational History  . Occupation: RETIRED    Employer: RETIRED  Tobacco Use  . Smoking status: Former Smoker    Packs/day: 2.00    Years: 10.00    Pack years: 20.00    Types: Cigarettes    Quit date: 07/18/1975    Years since quitting: 44.1  . Smokeless tobacco: Never Used  Substance and Sexual Activity  . Alcohol use: Yes    Comment: 1-2 drinks/day  . Drug use: No  . Sexual activity: Not Currently  Other Topics Concern  . Not on file  Social History Narrative  . Not on file   Social Determinants of Health   Financial Resource Strain:   . Difficulty of Paying Living Expenses:   Food Insecurity:   . Worried About Charity fundraiser in the Last Year:   . Arboriculturist in the Last Year:   Transportation Needs:   . Film/video editor (Medical):   Marland Kitchen Lack of Transportation (Non-Medical):   Physical Activity:   . Days of Exercise per Week:   . Minutes of Exercise per Session:   Stress:   . Feeling of Stress :   Social Connections:   . Frequency of Communication with Friends and Family:   . Frequency of Social Gatherings with Friends and Family:   . Attends Religious Services:   . Active Member of Clubs or Organizations:   . Attends Archivist Meetings:   Marland Kitchen Marital Status:   Intimate Partner Violence:   . Fear of Current or Ex-Partner:   . Emotionally Abused:   Marland Kitchen Physically Abused:   . Sexually Abused:     Past Medical History, Surgical history, Social history, and Family history were reviewed and updated as appropriate.   Please see review of systems for further details  on the patient's review from today.   Objective:   Physical Exam:  There were no vitals taken for this visit.  Physical Exam Constitutional:      General: He is not in acute distress. Musculoskeletal:        General: No deformity.  Neurological:     Mental Status: He is alert and oriented to person, place, and time.     Coordination: Coordination normal.   Psychiatric:        Attention and Perception: Attention and perception normal. He does not perceive auditory or visual hallucinations.        Mood and Affect: Mood is depressed. Mood is not anxious. Affect is flat. Affect is not labile, blunt, angry or inappropriate.        Speech: Speech normal.        Behavior: Behavior normal.        Thought Content: Thought content normal. Thought content is not paranoid or delusional. Thought content does not include homicidal or suicidal ideation. Thought content does not include homicidal or suicidal plan.        Cognition and Memory: Cognition and memory normal.        Judgment: Judgment normal.     Comments: Insight intact     Lab Review:     Component Value Date/Time   NA 137 08/14/2019 1028   NA 144 01/26/2017 1043   K 4.1 08/14/2019 1028   CL 98 08/14/2019 1028   CO2 30 08/14/2019 1028   GLUCOSE 269 (H) 08/14/2019 1028   BUN 13 08/14/2019 1028   BUN 18 01/26/2017 1043   CREATININE 1.00 08/14/2019 1028   CREATININE 1.07 09/02/2016 1144   CALCIUM 9.1 08/14/2019 1028   PROT 6.2 08/14/2019 1028   ALBUMIN 3.9 08/14/2019 1028   AST 17 08/14/2019 1028   ALT 17 08/14/2019 1028   ALKPHOS 87 08/14/2019 1028   BILITOT 0.3 08/14/2019 1028   GFRNONAA >60 05/13/2019 0441   GFRNONAA 82 04/29/2015 1430   GFRAA >60 05/13/2019 0441   GFRAA >89 04/29/2015 1430       Component Value Date/Time   WBC 4.9 08/14/2019 1028   RBC 4.10 (L) 08/14/2019 1028   HGB 12.5 (L) 08/14/2019 1028   HGB 13.3 01/26/2017 1043   HCT 37.5 (L) 08/14/2019 1028   HCT 38.8 01/26/2017 1043   PLT 111.0 (L) 08/14/2019 1028   PLT 143 (L) 01/26/2017 1043   MCV 91.6 08/14/2019 1028   MCV 89 01/26/2017 1043   MCH 30.1 05/13/2019 0441   MCHC 33.2 08/14/2019 1028   RDW 14.6 08/14/2019 1028   RDW 13.9 01/26/2017 1043   LYMPHSABS 1.3 08/14/2019 1028   MONOABS 1.3 (H) 08/14/2019 1028   EOSABS 0.0 08/14/2019 1028   BASOSABS 0.0 08/14/2019 1028    No results found for:  POCLITH, LITHIUM   No results found for: PHENYTOIN, PHENOBARB, VALPROATE, CBMZ   .res Assessment: Plan:    Plan:  PDMP reviewed  1. Cymbalta 62m BID - PCP currently prescribing.  2. Continue Wellbutrin SR 1090mBID - taking 2nd dose for one week - denies seizure history  RTC 4 weeks  Patient advised to contact office with any questions, adverse effects, or acute worsening in signs and symptoms.  Diagnoses and all orders for this visit:  Mild episode of recurrent major depressive disorder ([facility] Cognitive disorder     Please see After Visit Summary for patient specific instructions.  Future Appointments  Date Time Provider Department  Center  04/29/2020 10:15 AM [place] ADVISOR [id number] None    No orders of the defined types were placed in this encounter.   -------------------------------

## 2019-09-13 ENCOUNTER — Encounter: Payer: Self-pay | Admitting: Internal Medicine

## 2019-09-13 ENCOUNTER — Ambulatory Visit (INDEPENDENT_AMBULATORY_CARE_PROVIDER_SITE_OTHER): Payer: Medicare Other | Admitting: Internal Medicine

## 2019-09-13 DIAGNOSIS — R110 Nausea: Secondary | ICD-10-CM | POA: Diagnosis not present

## 2019-09-13 NOTE — Patient Instructions (Addendum)
Take miralax daily until you have a good bowel movement.  You can take this daily as needed.   Please call if there is no improvement in your symptoms.

## 2019-09-13 NOTE — Progress Notes (Signed)
Subjective:    Patient ID: Jonathan [name], male    DOB: 06-14-1933, 85 y.o.   MRN: [id number]  HPI The patient is here for an acute visit.   Nausea:  It started 3- days ago.  He feels nausea and has associated upper abdominal discomfort.  2 days he did not eat anything.  Not related to eating.  He usually has 2-3 BMs a day but has had some constipation.  His last good BM was 3-4 days ago.  He denies gerd. He denies fever/chills.      Medications and allergies reviewed with patient and updated if appropriate.  Patient Active Problem List   Diagnosis Date Noted  . Rotator cuff arthropathy of left shoulder 07/21/2019  . Dupuytren's disease of palm 07/12/2019  . Sebaceous cyst 06/27/2019  . COVID-19 05/08/2019  . Acute encephalopathy 05/08/2019  . Obstructive sleep apnea   . Asymmetrical left sensorineural hearing loss 07/12/2018  . Acute cystitis without hematuria 05/18/2018  . Chronic anticoagulation 12/21/2017  . Compression fracture of thoracic vertebra ([place]) 09/21/2017  . Degeneration of thoracic intervertebral disc 09/21/2017  . Thoracic back pain 08/10/2017  . Degenerative joint disease of hand 07/14/2017  . Osteoarthritis   . Hypoxia   . Fibromyalgia   . Fatty tumor   . Cataract   . BPH (benign prostatic hyperplasia)   . PAF (paroxysmal atrial fibrillation) ([place])   . Insomnia 03/10/2017  . Chronic pain, legs and back 03/10/2017  . Hyperlipidemia 03/10/2017  . Obesity (BMI 30.0-34.9) 12/08/2016  . Depression 09/07/2016  . Spondylolisthesis 09/07/2016  . Hypothyroidism 03/08/2016  . GERD (gastroesophageal reflux disease) 03/08/2016  . Gout 03/08/2016  . Glaucoma 03/08/2016  . Type 2 diabetes mellitus ([place]) 03/08/2016  . Hereditary and idiopathic peripheral neuropathy 08/28/2015  . Essential hypertension 12/04/2011  . Allergic rhinitis, seasonal 12/04/2011  . COPD (chronic obstructive pulmonary disease) ([place]) 07/18/2011    Current Outpatient Medications on  File Prior to Visit  Medication Sig Dispense Refill  . acetaminophen (TYLENOL) 500 MG tablet Take 2 tablets (1,000 mg total) by mouth every 8 (eight) hours as needed. (Patient taking differently: Take 650 mg by mouth 2 (two) times daily. ) 360 tablet 1  . allopurinol (ZYLOPRIM) 300 MG tablet TAKE 1 TABLET BY MOUTH IN  THE EVENING 90 tablet 3  . apixaban (ELIQUIS) 5 MG TABS tablet Take 1 tablet (5 mg total) by mouth 2 (two) times daily. 180 tablet 1  . atorvastatin (LIPITOR) 20 MG tablet Take 1 tablet (20 mg total) by mouth daily. 90 tablet 1  . bimatoprost (LUMIGAN) 0.01 % SOLN Place 1 drop into both eyes at bedtime.     . Biotin 5000 MCG TABS Take 5,000 mcg by mouth daily.    . blood glucose meter kit and supplies KIT Dispense based on patient and insurance preference. Use daily as directed. (FOR E11.9). 1 each 0  . buPROPion (WELLBUTRIN SR) 100 MG 12 hr tablet TAKE 1 TABLET BY MOUTH TWICE A DAY 180 tablet 1  . calcium-vitamin D (OSCAL WITH D) 500-200 MG-UNIT tablet Take 1 tablet by mouth daily with breakfast.    . colchicine (COLCRYS) 0.6 MG tablet Take 0.6 mg by mouth daily as needed (flareups).     . diltiazem (CARDIZEM CD) 300 MG 24 hr capsule TAKE 1 CAPSULE BY MOUTH  DAILY (Patient taking differently: Take 300 mg by mouth daily. ) 90 capsule 3  . dorzolamide-timolol (COSOPT) 22.3-6.8 MG/ML ophthalmic solution Place 1 drop into  the left eye 2 (two) times daily.    Marland Kitchen doxazosin (CARDURA) 8 MG tablet TAKE 1 TABLET BY MOUTH  DAILY. (Patient taking differently: Take 8 mg by mouth daily. ) 90 tablet 1  . DULoxetine (CYMBALTA) 30 MG capsule TAKE 2 CAPSULES BY MOUTH IN THE MORNING AND 1 CAPSULE  IN THE EVENING (Patient taking differently: TAKE 2 CAPSULES BY MOUTH IN THE MORNING AND 2 CAPSULE  IN THE EVENING) 270 capsule 3  . glucose blood test strip Use to check blood sugar daily. E11.9 100 each 3  . hydrochlorothiazide (HYDRODIURIL) 25 MG tablet Take 1 tablet (25 mg total) by mouth daily. 90 tablet 1    . Lancets MISC Use to check blood sugars daily. E11.9 100 each 3  . levothyroxine (SYNTHROID) 112 MCG tablet TAKE 1 TABLET BY MOUTH EVERY DAY (Patient taking differently: Take 112 mcg by mouth daily before breakfast. ) 90 tablet 1  . magnesium oxide (MAG-OX) 400 MG tablet Take 400 mg by mouth daily.    . Melatonin 5 MG TABS Take 5 mg by mouth at bedtime.    . metFORMIN (GLUCOPHAGE-XR) 750 MG 24 hr tablet TAKE 1 TABLET (750 MG TOTAL) BY MOUTH DAILY WITH SUPPER. 90 tablet 1  . mometasone (NASONEX) 50 MCG/ACT nasal spray Place 2 sprays into the nose daily. (Patient taking differently: Place 2 sprays into the nose as needed. ) 17 g 3  . Multiple Vitamin (MULTIVITAMIN) tablet Take 1 tablet by mouth daily.    . nitroGLYCERIN (NITROSTAT) 0.4 MG SL tablet Place 1 tablet (0.4 mg total) under the tongue every 5 (five) minutes as needed for chest pain. 90 tablet 3  . omeprazole (PRILOSEC) 20 MG capsule TAKE 1 CAPSULE BY MOUTH  DAILY 90 capsule 3  . oxyCODONE-acetaminophen (PERCOCET) 10-325 MG tablet Take 1 tablet by mouth every 8 (eight) hours as needed for pain.    . potassium chloride SA (K-DUR) 20 MEQ tablet Take 1 tablet (20 mEq total) by mouth daily. 90 tablet 3  . solifenacin (VESICARE) 5 MG tablet Take 5 mg by mouth daily.    . vitamin C (ASCORBIC ACID) 500 MG tablet Take 500 mg by mouth 2 (two) times daily.     Marland Kitchen VITAMIN D, CHOLECALCIFEROL, PO Take 25 mcg by mouth daily.     . zaleplon (SONATA) 10 MG capsule Take 1 capsule (10 mg total) by mouth at bedtime. 30 capsule 0  . zolpidem (AMBIEN) 10 MG tablet TAKE 1 TABLET BY MOUTH AT BEDTIME AS NEEDED FOR SLEEP. 30 tablet 0   No current facility-administered medications on file prior to visit.    Past Medical History:  Diagnosis Date  . Atrial fibrillation (HCC)    a. paroxysmal, on Eliquis for anticoagulation  . BPH (benign prostatic hyperplasia)   . Bronchitis   . Cataract   . COPD (chronic obstructive pulmonary disease) (HCC)    2 liters O2  HS  . Depression   . Fatty tumor    waste and back  . Fibromyalgia   . GERD (gastroesophageal reflux disease)   . Glaucoma    bilateral eyes  . Gout   . Hereditary and idiopathic peripheral neuropathy 08/28/2015  . Hypertension   . Hypothyroidism   . Hypoxia   . Insomnia   . Osteoarthritis   . Osteoporosis   . Peripheral neuropathy   . Sleep apnea    uses cpap-add oxygen at night  . Spinal compression fracture (HCC) seventh vertebre  . Transient  alteration of awareness 08/28/2015  . Type 2 diabetes mellitus ([place]) 03/08/2016    Past Surgical History:  Procedure Laterality Date  . APPENDECTOMY  age 46  . CARPAL TUNNEL RELEASE Right    early 2000s  . CATARACT EXTRACTION     bilateral  . CHOLECYSTECTOMY  age 70  . EYE SURGERY    . FOOT ARTHRODESIS Right 02/02/2013   Procedure: RIGHT HALLUX METATARSAL PHALANGEAL JOINT ARTHRODESIS ;  Surgeon: Wylene Simmer, MD;  Location: [facility];  Service: Orthopedics;  Laterality: Right;  . INGUINAL HERNIA REPAIR  age 35   rt side  . NASAL CONCHA BULLOSA RESECTION  age 51  . PROSTATE SURGERY    . SHOULDER ARTHROSCOPY W/ ROTATOR CUFF REPAIR Right    early 2000s  . tonsil    . VASECTOMY  age 14    Social History   Socioeconomic History  . Marital status: Married    Spouse name: Not on file  . Number of children: 2  . Years of education: 22  . Highest education level: Master's degree (e.g., MA, MS, MEng, MEd, MSW, MBA)  Occupational History  . Occupation: RETIRED    Employer: RETIRED  Tobacco Use  . Smoking status: Former Smoker    Packs/day: 2.00    Years: 10.00    Pack years: 20.00    Types: Cigarettes    Quit date: 07/18/1975    Years since quitting: 44.1  . Smokeless tobacco: Never Used  Substance and Sexual Activity  . Alcohol use: Yes    Comment: 1-2 drinks/day  . Drug use: No  . Sexual activity: Not Currently  Other Topics Concern  . Not on file  Social History Narrative  . Not on file   Social  Determinants of Health   Financial Resource Strain:   . Difficulty of Paying Living Expenses:   Food Insecurity:   . Worried About Charity fundraiser in the Last Year:   . Arboriculturist in the Last Year:   Transportation Needs:   . Film/video editor (Medical):   Marland Kitchen Lack of Transportation (Non-Medical):   Physical Activity:   . Days of Exercise per Week:   . Minutes of Exercise per Session:   Stress:   . Feeling of Stress :   Social Connections:   . Frequency of Communication with Friends and Family:   . Frequency of Social Gatherings with Friends and Family:   . Attends Religious Services:   . Active Member of Clubs or Organizations:   . Attends Archivist Meetings:   Marland Kitchen Marital Status:     Family History  Problem Relation Age of Onset  . Coronary artery disease Brother   . Heart disease Father   . Lung cancer Father   . Kidney cancer Father   . Prostate cancer Father   . Arthritis Mother   . Lung cancer Mother   . Dementia Mother        Unspecified type, not Alzheimer's disease    Review of Systems  Constitutional: Negative for chills and fever.  Gastrointestinal: Positive for abdominal pain (discomfort across upper abdomen), constipation and nausea. Negative for vomiting.  Neurological: Positive for light-headedness. Negative for headaches.       Objective:   Vitals:   09/13/19 1506  BP: (!) 150/72  Pulse: 65  Resp: 16  Temp: 98.2 F (36.8 C)  SpO2: 99%   BP Readings from Last 3 Encounters:  09/13/19 (!) 150/72  08/14/19 140/72  07/19/19 (!) 128/54   Wt Readings from Last 3 Encounters:  09/13/19 195 lb (88.5 kg)  08/14/19 205 lb 12.8 oz (93.4 kg)  07/19/19 200 lb 12.8 oz (91.1 kg)   Body mass index is 30.54 kg/m.   Physical Exam Constitutional:      General: He is not in acute distress.    Appearance: Normal appearance. He is not ill-appearing.  HENT:     Head: Normocephalic and atraumatic.  Abdominal:     Palpations:  Abdomen is soft.     Tenderness: There is no abdominal tenderness. There is no guarding or rebound.     Hernia: A hernia (ventral) is present.     Comments: obese  Skin:    General: Skin is warm and dry.  Neurological:     Mental Status: He is alert.            Assessment & Plan:    See Problem List for Assessment and Plan of chronic medical problems.    This visit occurred during the SARS-CoV-2 public health emergency.  Safety protocols were in place, including screening questions prior to the visit, additional usage of staff PPE, and extensive cleaning of exam room while observing appropriate contact time as indicated for disinfecting solutions.

## 2019-09-13 NOTE — Assessment & Plan Note (Signed)
Acute  - started 3 days ago Having constipation x 3-4 days ago, typically has 2-3 or even 6 BMs daily No GERD or concerning abd pain, fever Nausea likely related to constipation Start miralax daily until good BM Call if no improvement

## 2019-09-15 ENCOUNTER — Encounter: Payer: Self-pay | Admitting: Internal Medicine

## 2019-09-18 ENCOUNTER — Encounter: Payer: Self-pay | Admitting: Internal Medicine

## 2019-09-18 ENCOUNTER — Other Ambulatory Visit (INDEPENDENT_AMBULATORY_CARE_PROVIDER_SITE_OTHER): Payer: Medicare Other

## 2019-09-18 ENCOUNTER — Telehealth: Payer: Self-pay | Admitting: Adult Health

## 2019-09-18 DIAGNOSIS — M546 Pain in thoracic spine: Secondary | ICD-10-CM | POA: Diagnosis not present

## 2019-09-18 DIAGNOSIS — R110 Nausea: Secondary | ICD-10-CM

## 2019-09-18 DIAGNOSIS — M5136 Other intervertebral disc degeneration, lumbar region: Secondary | ICD-10-CM | POA: Diagnosis not present

## 2019-09-18 DIAGNOSIS — M810 Age-related osteoporosis without current pathological fracture: Secondary | ICD-10-CM | POA: Diagnosis not present

## 2019-09-18 DIAGNOSIS — M5134 Other intervertebral disc degeneration, thoracic region: Secondary | ICD-10-CM | POA: Diagnosis not present

## 2019-09-18 LAB — CBC WITH DIFFERENTIAL/PLATELET
Basophils Absolute: 0 10*3/uL (ref 0.0–0.1)
Basophils Relative: 0.2 % (ref 0.0–3.0)
Eosinophils Absolute: 0 10*3/uL (ref 0.0–0.7)
Eosinophils Relative: 0.5 % (ref 0.0–5.0)
HCT: 42.2 % (ref 39.0–52.0)
Hemoglobin: 14 g/dL (ref 13.0–17.0)
Lymphocytes Relative: 29 % (ref 12.0–46.0)
Lymphs Abs: 2.1 10*3/uL (ref 0.7–4.0)
MCHC: 33.3 g/dL (ref 30.0–36.0)
MCV: 91.6 fl (ref 78.0–100.0)
Monocytes Absolute: 1.5 10*3/uL — ABNORMAL HIGH (ref 0.1–1.0)
Monocytes Relative: 21.1 % — ABNORMAL HIGH (ref 3.0–12.0)
Neutro Abs: 3.6 10*3/uL (ref 1.4–7.7)
Neutrophils Relative %: 49.2 % (ref 43.0–77.0)
Platelets: 104 10*3/uL — ABNORMAL LOW (ref 150.0–400.0)
RBC: 4.61 Mil/uL (ref 4.22–5.81)
RDW: 14 % (ref 11.5–15.5)
WBC: 7.3 10*3/uL (ref 4.0–10.5)

## 2019-09-18 LAB — COMPREHENSIVE METABOLIC PANEL
ALT: 18 U/L (ref 0–53)
AST: 13 U/L (ref 0–37)
Albumin: 4.5 g/dL (ref 3.5–5.2)
Alkaline Phosphatase: 91 U/L (ref 39–117)
BUN: 12 mg/dL (ref 6–23)
CO2: 27 mEq/L (ref 19–32)
Calcium: 9.8 mg/dL (ref 8.4–10.5)
Chloride: 100 mEq/L (ref 96–112)
Creatinine, Ser: 1.03 mg/dL (ref 0.40–1.50)
GFR: 68.75 mL/min (ref >60.00)
Glucose, Bld: 193 mg/dL — ABNORMAL HIGH (ref 70–99)
Potassium: 3.8 mEq/L (ref 3.5–5.1)
Sodium: 137 mEq/L (ref 135–145)
Total Bilirubin: 0.5 mg/dL (ref 0.2–1.2)
Total Protein: 6.7 g/dL (ref 6.0–8.3)

## 2019-09-18 NOTE — Telephone Encounter (Signed)
We can have him stop the Wellbutrin if he's not tolerating it.

## 2019-09-18 NOTE — Telephone Encounter (Signed)
Patient agreed to go ahead and stop Wellbutrin, advised to call back to let us know how he's doing. Has apt end of April for follow up

## 2019-09-18 NOTE — Telephone Encounter (Signed)
Pt stated very nauseated and having sleep issues. Inquiring if it has anything to do with his Wellbutrin. Please advise.

## 2019-09-19 DIAGNOSIS — M4854XD Collapsed vertebra, not elsewhere classified, thoracic region, subsequent encounter for fracture with routine healing: Secondary | ICD-10-CM | POA: Diagnosis not present

## 2019-09-19 DIAGNOSIS — M546 Pain in thoracic spine: Secondary | ICD-10-CM | POA: Diagnosis not present

## 2019-09-21 ENCOUNTER — Other Ambulatory Visit: Payer: Self-pay | Admitting: Internal Medicine

## 2019-09-21 NOTE — Telephone Encounter (Signed)
Last OV 08/14/19 Last RF 08/29/19

## 2019-09-25 DIAGNOSIS — M546 Pain in thoracic spine: Secondary | ICD-10-CM | POA: Diagnosis not present

## 2019-09-25 DIAGNOSIS — M4854XD Collapsed vertebra, not elsewhere classified, thoracic region, subsequent encounter for fracture with routine healing: Secondary | ICD-10-CM | POA: Diagnosis not present

## 2019-09-27 DIAGNOSIS — H401132 Primary open-angle glaucoma, bilateral, moderate stage: Secondary | ICD-10-CM | POA: Diagnosis not present

## 2019-09-27 DIAGNOSIS — H16223 Keratoconjunctivitis sicca, not specified as Sjogren's, bilateral: Secondary | ICD-10-CM | POA: Diagnosis not present

## 2019-09-29 DIAGNOSIS — M546 Pain in thoracic spine: Secondary | ICD-10-CM | POA: Diagnosis not present

## 2019-10-01 ENCOUNTER — Encounter: Payer: Self-pay | Admitting: Internal Medicine

## 2019-10-02 MED ORDER — ZALEPLON 10 MG PO CAPS: 10.0000 mg | ORAL_CAPSULE | Freq: Every day | ORAL | 0 refills | Status: DC

## 2019-10-04 DIAGNOSIS — M546 Pain in thoracic spine: Secondary | ICD-10-CM | POA: Diagnosis not present

## 2019-10-06 DIAGNOSIS — M546 Pain in thoracic spine: Secondary | ICD-10-CM | POA: Diagnosis not present

## 2019-10-10 ENCOUNTER — Ambulatory Visit: Payer: Medicare Other | Admitting: Adult Health

## 2019-10-10 DIAGNOSIS — M546 Pain in thoracic spine: Secondary | ICD-10-CM | POA: Diagnosis not present

## 2019-10-13 DIAGNOSIS — M546 Pain in thoracic spine: Secondary | ICD-10-CM | POA: Diagnosis not present

## 2019-10-18 DIAGNOSIS — M546 Pain in thoracic spine: Secondary | ICD-10-CM | POA: Diagnosis not present

## 2019-10-20 DIAGNOSIS — M546 Pain in thoracic spine: Secondary | ICD-10-CM | POA: Diagnosis not present

## 2019-10-24 DIAGNOSIS — M546 Pain in thoracic spine: Secondary | ICD-10-CM | POA: Diagnosis not present

## 2019-10-27 DIAGNOSIS — M546 Pain in thoracic spine: Secondary | ICD-10-CM | POA: Diagnosis not present

## 2019-10-31 ENCOUNTER — Encounter: Payer: Self-pay | Admitting: Internal Medicine

## 2019-11-01 ENCOUNTER — Encounter: Payer: Self-pay | Admitting: Internal Medicine

## 2019-11-01 MED ORDER — ZALEPLON 10 MG PO CAPS: 10.0000 mg | ORAL_CAPSULE | Freq: Every day | ORAL | 0 refills | Status: DC

## 2019-11-02 DIAGNOSIS — M546 Pain in thoracic spine: Secondary | ICD-10-CM | POA: Diagnosis not present

## 2019-11-08 DIAGNOSIS — H401132 Primary open-angle glaucoma, bilateral, moderate stage: Secondary | ICD-10-CM | POA: Diagnosis not present

## 2019-11-08 DIAGNOSIS — H16223 Keratoconjunctivitis sicca, not specified as Sjogren's, bilateral: Secondary | ICD-10-CM | POA: Diagnosis not present

## 2019-11-09 DIAGNOSIS — M546 Pain in thoracic spine: Secondary | ICD-10-CM | POA: Diagnosis not present

## 2019-11-13 ENCOUNTER — Other Ambulatory Visit: Payer: Self-pay | Admitting: Internal Medicine

## 2019-11-16 DIAGNOSIS — M546 Pain in thoracic spine: Secondary | ICD-10-CM | POA: Diagnosis not present

## 2019-11-21 ENCOUNTER — Other Ambulatory Visit: Payer: Self-pay | Admitting: Internal Medicine

## 2019-11-21 MED ORDER — ZALEPLON 10 MG PO CAPS: 10.0000 mg | ORAL_CAPSULE | Freq: Every day | ORAL | 0 refills | Status: DC

## 2019-11-23 DIAGNOSIS — M546 Pain in thoracic spine: Secondary | ICD-10-CM | POA: Diagnosis not present

## 2019-11-28 DIAGNOSIS — M5136 Other intervertebral disc degeneration, lumbar region: Secondary | ICD-10-CM | POA: Diagnosis not present

## 2019-11-28 DIAGNOSIS — M79641 Pain in right hand: Secondary | ICD-10-CM | POA: Diagnosis not present

## 2019-11-28 DIAGNOSIS — M4854XS Collapsed vertebra, not elsewhere classified, thoracic region, sequela of fracture: Secondary | ICD-10-CM | POA: Diagnosis not present

## 2019-11-28 DIAGNOSIS — M546 Pain in thoracic spine: Secondary | ICD-10-CM | POA: Diagnosis not present

## 2019-12-11 ENCOUNTER — Encounter: Payer: Self-pay | Admitting: Internal Medicine

## 2019-12-12 ENCOUNTER — Other Ambulatory Visit: Payer: Self-pay | Admitting: Cardiology

## 2019-12-12 MED ORDER — DULOXETINE HCL 30 MG PO CPEP: ORAL_CAPSULE | ORAL | 3 refills | Status: DC

## 2019-12-20 ENCOUNTER — Telehealth: Payer: Self-pay | Admitting: Internal Medicine

## 2019-12-20 NOTE — Progress Notes (Signed)
  Chronic Care Management   Note  12/20/2019 Name: Jonathan [name] MRN: [id number] DOB: 1932-07-11  Jonathan [name] is a 86 y.o. year old male who is a primary care patient of Burns, Claudina Lick, MD. I reached out to [name] by phone today in response to a referral sent by [name]'s PCP, Binnie Rail, MD.   Jonathan [name] was given information about Chronic Care Management services today including:  1. CCM service includes personalized support from designated clinical staff supervised by his physician, including individualized plan of care and coordination with other care providers 2. 24/7 contact phone numbers for assistance for urgent and routine care needs. 3. Service will only be billed when office clinical staff spend 20 minutes or more in a month to coordinate care. 4. Only one practitioner may furnish and bill the service in a calendar month. 5. The patient may stop CCM services at any time (effective at the end of the month) by phone call to the office staff.   Patient agreed to services and verbal consent obtained.  This note is not being shared with the patient for the following reason: To respect privacy (The patient or proxy has requested that the information not be shared).  Follow up plan:   Earney Hamburg Upstream Scheduler

## 2019-12-25 ENCOUNTER — Encounter: Payer: Self-pay | Admitting: Internal Medicine

## 2019-12-25 MED ORDER — ZOLPIDEM TARTRATE 10 MG PO TABS: ORAL_TABLET | ORAL | 0 refills | Status: DC

## 2019-12-29 DIAGNOSIS — M1712 Unilateral primary osteoarthritis, left knee: Secondary | ICD-10-CM | POA: Diagnosis not present

## 2019-12-29 DIAGNOSIS — M25562 Pain in left knee: Secondary | ICD-10-CM | POA: Diagnosis not present

## 2020-01-07 ENCOUNTER — Other Ambulatory Visit: Payer: Self-pay | Admitting: Internal Medicine

## 2020-01-10 DIAGNOSIS — H401132 Primary open-angle glaucoma, bilateral, moderate stage: Secondary | ICD-10-CM | POA: Diagnosis not present

## 2020-01-10 DIAGNOSIS — H35373 Puckering of macula, bilateral: Secondary | ICD-10-CM | POA: Diagnosis not present

## 2020-01-10 DIAGNOSIS — H52203 Unspecified astigmatism, bilateral: Secondary | ICD-10-CM | POA: Diagnosis not present

## 2020-01-10 DIAGNOSIS — E119 Type 2 diabetes mellitus without complications: Secondary | ICD-10-CM | POA: Diagnosis not present

## 2020-01-15 DIAGNOSIS — D225 Melanocytic nevi of trunk: Secondary | ICD-10-CM | POA: Diagnosis not present

## 2020-01-15 DIAGNOSIS — L570 Actinic keratosis: Secondary | ICD-10-CM | POA: Diagnosis not present

## 2020-01-15 DIAGNOSIS — Z1283 Encounter for screening for malignant neoplasm of skin: Secondary | ICD-10-CM | POA: Diagnosis not present

## 2020-01-15 DIAGNOSIS — X32XXXA Exposure to sunlight, initial encounter: Secondary | ICD-10-CM | POA: Diagnosis not present

## 2020-01-23 ENCOUNTER — Other Ambulatory Visit: Payer: Self-pay | Admitting: Internal Medicine

## 2020-01-29 ENCOUNTER — Other Ambulatory Visit: Payer: Self-pay | Admitting: Internal Medicine

## 2020-01-29 NOTE — Progress Notes (Signed)
Virtual Visit via Video Note   This visit type was conducted due to national recommendations for restrictions regarding the COVID-19 Pandemic (e.g. social distancing) in an effort to limit this patient's exposure and mitigate transmission in our community.  Due to his co-morbid illnesses, this patient is at least at moderate risk for complications without adequate follow up.  This format is felt to be most appropriate for this patient at this time.  All issues noted in this document were discussed and addressed.  A limited physical exam was performed with this format.  Please refer to the patient's chart for his consent to telehealth for [facility].      Date:  01/30/2020   ID:  Jonathan [name], DOB April 15, 1933, MRN [id number]  Patient Location:Home Provider Location: Home  PCP:  Binnie Rail, MD  Cardiologist:  Dr Stanford Breed  Evaluation Performed:  Follow-Up Visit  Chief Complaint:  FU atrial fibrillation  History of Present Illness:    FU atrial fibrillation. Previously with a fib but converted spontaneously to sinus rhythm. Patient also with significant COPD. Abdominal ultrasound March 2015 showed no aneurysm. Nuclear study July 2016 showed ejection fraction 56% and normal perfusion. Echocardiogram repeated August 2018 and showed normal LV function, grade 2 diastolic dysfunction and mild left atrial enlargement. Monitor August 2018 showed sinus rhythm with PACs, PVCs and PAF.Monitor repeated July 2019. Sinus rhythm with PACs, PVCs, rare couplet and 3 beats nonsustained ventricular tachycardia. Had fall September 2019 resulting in subdural hematoma. Since I last sawhim,patient denies dyspnea, chest pain, palpitations or syncope.  No bleeding.  The patient does not have symptoms concerning for COVID-19 infection (fever, chills, cough, or new shortness of breath).    Past Medical History:  Diagnosis Date  . Atrial fibrillation (HCC)    a. paroxysmal, on Eliquis for  anticoagulation  . BPH (benign prostatic hyperplasia)   . Bronchitis   . Cataract   . COPD (chronic obstructive pulmonary disease) (HCC)    2 liters O2 HS  . Depression   . Fatty tumor    waste and back  . Fibromyalgia   . GERD (gastroesophageal reflux disease)   . Glaucoma    bilateral eyes  . Gout   . Hereditary and idiopathic peripheral neuropathy 08/28/2015  . Hypertension   . Hypothyroidism   . Hypoxia   . Insomnia   . Osteoarthritis   . Osteoporosis   . Peripheral neuropathy   . Sleep apnea    uses cpap-add oxygen at night  . Spinal compression fracture (HCC) seventh vertebre  . Transient alteration of awareness 08/28/2015  . Type 2 diabetes mellitus ([place]) 03/08/2016   Past Surgical History:  Procedure Laterality Date  . APPENDECTOMY  age 55  . CARPAL TUNNEL RELEASE Right    early 2000s  . CATARACT EXTRACTION     bilateral  . CHOLECYSTECTOMY  age 89  . EYE SURGERY    . FOOT ARTHRODESIS Right 02/02/2013   Procedure: RIGHT HALLUX METATARSAL PHALANGEAL JOINT ARTHRODESIS ;  Surgeon: Wylene Simmer, MD;  Location: [facility];  Service: Orthopedics;  Laterality: Right;  . INGUINAL HERNIA REPAIR  age 18   rt side  . NASAL CONCHA BULLOSA RESECTION  age 79  . PROSTATE SURGERY    . SHOULDER ARTHROSCOPY W/ ROTATOR CUFF REPAIR Right    early 2000s  . tonsil    . VASECTOMY  age 26     Current Meds  Medication Sig  . acetaminophen (TYLENOL) 500 MG  tablet Take 2 tablets (1,000 mg total) by mouth every 8 (eight) hours as needed. (Patient taking differently: Take 650 mg by mouth 2 (two) times daily. )  . allopurinol (ZYLOPRIM) 300 MG tablet TAKE 1 TABLET BY MOUTH IN  THE EVENING  . atorvastatin (LIPITOR) 20 MG tablet TAKE 1 TABLET BY MOUTH EVERY DAY  . bimatoprost (LUMIGAN) 0.01 % SOLN Place 1 drop into both eyes at bedtime.   . Biotin 5000 MCG TABS Take 5,000 mcg by mouth daily.  . blood glucose meter kit and supplies KIT Dispense based on patient and insurance  preference. Use daily as directed. (FOR E11.9).  . calcium-vitamin D (OSCAL WITH D) 500-200 MG-UNIT tablet Take 1 tablet by mouth daily with breakfast.  . colchicine (COLCRYS) 0.6 MG tablet Take 0.6 mg by mouth daily as needed (flareups).   . diltiazem (CARDIZEM CD) 300 MG 24 hr capsule TAKE 1 CAPSULE BY MOUTH  DAILY (Patient taking differently: Take 300 mg by mouth daily. )  . dorzolamide-timolol (COSOPT) 22.3-6.8 MG/ML ophthalmic solution Place 1 drop into the left eye 2 (two) times daily.  Marland Kitchen doxazosin (CARDURA) 8 MG tablet TAKE 1 TABLET BY MOUTH EVERY DAY  . DULoxetine (CYMBALTA) 30 MG capsule TAKE 2 CAPSULES BY MOUTH IN THE MORNING AND 2 CAPSULE IN THE EVENING  . ELIQUIS 5 MG TABS tablet TAKE 1 TABLET BY MOUTH TWICE A DAY  . glucose blood test strip Use to check blood sugar daily. E11.9  . hydrochlorothiazide (HYDRODIURIL) 25 MG tablet Take 1 tablet (25 mg total) by mouth daily.  . Lancets MISC Use to check blood sugars daily. E11.9  . levothyroxine (SYNTHROID) 112 MCG tablet Take 1 tablet (112 mcg total) by mouth daily before breakfast.  . magnesium oxide (MAG-OX) 400 MG tablet Take 400 mg by mouth daily.  . Melatonin 5 MG TABS Take 5 mg by mouth at bedtime.  . metFORMIN (GLUCOPHAGE-XR) 750 MG 24 hr tablet TAKE 1 TABLET (750 MG TOTAL) BY MOUTH DAILY WITH SUPPER.  . mometasone (NASONEX) 50 MCG/ACT nasal spray Place 2 sprays into the nose daily. (Patient taking differently: Place 2 sprays into the nose as needed. )  . Multiple Vitamin (MULTIVITAMIN) tablet Take 1 tablet by mouth daily.  . nitroGLYCERIN (NITROSTAT) 0.4 MG SL tablet Place 1 tablet (0.4 mg total) under the tongue every 5 (five) minutes as needed for chest pain.  Marland Kitchen omeprazole (PRILOSEC) 20 MG capsule TAKE 1 CAPSULE BY MOUTH  DAILY  . oxyCODONE-acetaminophen (PERCOCET) 10-325 MG tablet Take 1 tablet by mouth every 8 (eight) hours as needed for pain.  . potassium chloride SA (K-DUR) 20 MEQ tablet Take 1 tablet (20 mEq total) by  mouth daily.  . RESTASIS 0.05 % ophthalmic emulsion 1 drop 2 (two) times daily.  . sildenafil (REVATIO) 20 MG tablet Take 40-100 mg by mouth daily as needed.  . solifenacin (VESICARE) 5 MG tablet Take 5 mg by mouth daily.  . vitamin C (ASCORBIC ACID) 500 MG tablet Take 500 mg by mouth 2 (two) times daily.   Marland Kitchen VITAMIN D, CHOLECALCIFEROL, PO Take 25 mcg by mouth daily.   . zaleplon (SONATA) 10 MG capsule Take 1 capsule (10 mg total) by mouth at bedtime.  Marland Kitchen zolpidem (AMBIEN) 10 MG tablet TAKE 1 TABLET BY MOUTH EVERY DAY AT BEDTIME AS NEEDED FOR SLEEP     Allergies:   Other   Social History   Tobacco Use  . Smoking status: Former Smoker    Packs/day: 2.00  Years: 10.00    Pack years: 20.00    Types: Cigarettes    Quit date: 07/18/1975    Years since quitting: 44.5  . Smokeless tobacco: Never Used  Vaping Use  . Vaping Use: Never used  Substance Use Topics  . Alcohol use: Yes    Comment: 1-2 drinks/day  . Drug use: No     Family Hx: The patient's family history includes Arthritis in his mother; Coronary artery disease in his brother; Dementia in his mother; Heart disease in his father; Kidney cancer in his father; Lung cancer in his father and mother; Prostate cancer in his father.  ROS:   Please see the history of present illness.    No Fever, chills  or productive cough All other systems reviewed and are negative.   Recent Labs: 05/08/2019: B Natriuretic Peptide 90.8 05/10/2019: Magnesium 1.8 08/14/2019: TSH 3.13 09/18/2019: ALT 18; BUN 12; Creatinine, Ser 1.03; Hemoglobin 14.0; Platelets 104.0; Potassium 3.8; Sodium 137   Recent Lipid Panel Lab Results  Component Value Date/Time   CHOL 111 08/14/2019 10:28 AM   TRIG 211.0 (H) 08/14/2019 10:28 AM   HDL 37.60 (L) 08/14/2019 10:28 AM   CHOLHDL 3 08/14/2019 10:28 AM   LDLCALC 66 11/11/2018 11:14 AM   LDLDIRECT 51.0 08/14/2019 10:28 AM    Wt Readings from Last 3 Encounters:  01/30/20 198 lb (89.8 kg)  09/13/19 195 lb  (88.5 kg)  08/14/19 205 lb 12.8 oz (93.4 kg)     Objective:    Vital Signs:  BP 132/83   Pulse 80   Ht 5' 6.5" (1.689 m)   Wt 198 lb (89.8 kg)   BMI 31.48 kg/m    VITAL SIGNS:  reviewed NAD Answers questions appropriately Normal affect Remainder of physical examination not performed (telehealth visit; coronavirus pandemic)  ASSESSMENT & PLAN:    1. Paroxysmal atrial fibrillation-based on history patient remains in sinus rhythm. We will continue with apixaban at present dose. 2. Hypertension-blood pressure is controlled today. Continue present medications. 3. Obstructive sleep apnea-patient will continue with CPAP. 4. Hyperlipidemia-continue statin.  COVID-19 Education: The importance of social distancing was discussed today.  Time:   Today, I have spent 14 minutes with the patient with telehealth technology discussing the above problems.     Medication Adjustments/Labs and Tests Ordered: Current medicines are reviewed at length with the patient today.  Concerns regarding medicines are outlined above.   Tests Ordered: No orders of the defined types were placed in this encounter.   Medication Changes: No orders of the defined types were placed in this encounter.   Follow Up:  In Person in 1 year(s)  Signed, Kirk Ruths, MD  01/30/2020 8:42 AM    [place]

## 2020-01-30 ENCOUNTER — Encounter: Payer: Self-pay | Admitting: Cardiology

## 2020-01-30 ENCOUNTER — Other Ambulatory Visit: Payer: Self-pay | Admitting: Internal Medicine

## 2020-01-30 ENCOUNTER — Telehealth (INDEPENDENT_AMBULATORY_CARE_PROVIDER_SITE_OTHER): Payer: Medicare Other | Admitting: Cardiology

## 2020-01-30 VITALS — BP 132/83 | HR 80 | Ht 66.5 in | Wt 198.0 lb

## 2020-01-30 DIAGNOSIS — I480 Paroxysmal atrial fibrillation: Secondary | ICD-10-CM | POA: Diagnosis not present

## 2020-01-30 DIAGNOSIS — I10 Essential (primary) hypertension: Secondary | ICD-10-CM | POA: Diagnosis not present

## 2020-01-30 DIAGNOSIS — E7800 Pure hypercholesterolemia, unspecified: Secondary | ICD-10-CM

## 2020-01-30 MED ORDER — DULOXETINE HCL 60 MG PO CPEP
60.0000 mg | ORAL_CAPSULE | Freq: Two times a day (BID) | ORAL | 1 refills | Status: DC
Start: 2020-01-30 — End: 2020-06-12

## 2020-01-30 NOTE — Patient Instructions (Signed)
Medication Instructions:  NO CHANGE *If you need a refill on your cardiac medications before your next appointment, please call your pharmacy*   Lab Work: If you have labs (blood work) drawn today and your tests are completely normal, you will receive your results only by: Marland Kitchen MyChart Message (if you have MyChart) OR . A paper copy in the mail If you have any lab test that is abnormal or we need to change your treatment, we will call you to review the results.   Follow-Up: At [facility], you and your health needs are our priority.  As part of our continuing mission to provide you with exceptional heart care, we have created designated Provider Care Teams.  These Care Teams include your primary Cardiologist (physician) and Advanced Practice Providers (APPs -  Physician Assistants and Nurse Practitioners) who all work together to provide you with the care you need, when you need it.  We recommend signing up for the patient portal called "MyChart".  Sign up information is provided on this After Visit Summary.  MyChart is used to connect with patients for Virtual Visits (Telemedicine).  Patients are able to view lab/test results, encounter notes, upcoming appointments, etc.  Non-urgent messages can be sent to your provider as well.   To learn more about what you can do with MyChart, go to [email or web address].    Your next appointment:   12 month(s)  The format for your next appointment:   In Person  Provider:   You may see Kirk Ruths, MD or one of the following Advanced Practice Providers on your designated Care Team:    Kerin Ransom, PA-C  [place], [place]  Coletta Memos, [place]

## 2020-02-15 DIAGNOSIS — Z23 Encounter for immunization: Secondary | ICD-10-CM | POA: Diagnosis not present

## 2020-02-28 ENCOUNTER — Encounter: Payer: Self-pay | Admitting: Internal Medicine

## 2020-02-28 MED ORDER — ZOLPIDEM TARTRATE 10 MG PO TABS: ORAL_TABLET | ORAL | 0 refills | Status: DC

## 2020-02-28 NOTE — Telephone Encounter (Signed)
Check [place] registry last filled 01/30/2020.Marland KitchenJohny Chess

## 2020-02-29 ENCOUNTER — Other Ambulatory Visit: Payer: Self-pay | Admitting: Internal Medicine

## 2020-03-11 ENCOUNTER — Other Ambulatory Visit: Payer: Self-pay

## 2020-03-11 ENCOUNTER — Ambulatory Visit: Payer: Medicare Other | Admitting: Pharmacist

## 2020-03-11 DIAGNOSIS — I10 Essential (primary) hypertension: Secondary | ICD-10-CM

## 2020-03-11 DIAGNOSIS — E782 Mixed hyperlipidemia: Secondary | ICD-10-CM

## 2020-03-11 DIAGNOSIS — E1165 Type 2 diabetes mellitus with hyperglycemia: Secondary | ICD-10-CM

## 2020-03-11 DIAGNOSIS — N401 Enlarged prostate with lower urinary tract symptoms: Secondary | ICD-10-CM

## 2020-03-11 DIAGNOSIS — M109 Gout, unspecified: Secondary | ICD-10-CM

## 2020-03-11 DIAGNOSIS — K219 Gastro-esophageal reflux disease without esophagitis: Secondary | ICD-10-CM

## 2020-03-11 NOTE — Patient Instructions (Addendum)
Visit Information  Phone number for Pharmacist: [phone]  Thank you for meeting with me to discuss your medications! I look forward to working with you to achieve your health care goals. Below is a summary of what we talked about during the visit:  Goals Addressed            This Visit's Progress   . Pharmacy Care Plan       CARE PLAN ENTRY (see longitudinal plan of care for additional care plan information)  Current Barriers:  . Chronic Disease Management support, education, and care coordination needs related to Hypertension, Hyperlipidemia, Diabetes, GERD, Overactive Bladder, Gout, and Glaucoma   Hypertension BP Readings from Last 3 Encounters:  01/30/20 132/83  09/13/19 (!) 150/72  08/14/19 140/72 .  Pharmacist Clinical Goal(s): o Over the next 90 days, patient will work with PharmD and providers to maintain BP goal <140/90 . Current regimen:  o HCTZ 25 mg daily o Doxazosin 8 mg daily o Diltiazem 300 mg daily . Interventions: o Discussed BP goals and benefits of medications for prevention of heart attack / stroke . Patient self care activities - Over the next 90 days, patient will: o Check BP daily, document, and provide at future appointments o Ensure daily salt intake < 2300 mg/day  Hyperlipidemia Lab Results  Component Value Date/Time   LDLCALC 66 11/11/2018 11:14 AM   LDLDIRECT 51.0 08/14/2019 10:28 AM .  Pharmacist Clinical Goal(s): o Over the next 90 days, patient will work with PharmD and providers to maintain LDL goal < 100 . Current regimen:  o Atorvastatin 20 mg daily o Nitroglycerin 0.4 mg SL prn . Interventions: o Discussed cholesterol goals and benefits of medications for prevention of heart attack / stroke . Patient self care activities - Over the next 90 days, patient will: o Continue current medications  Diabetes Lab Results  Component Value Date/Time   HGBA1C 8.0 (H) 08/14/2019 10:28 AM   HGBA1C 7.4 (H) 02/09/2019 10:36 AM .  Pharmacist  Clinical Goal(s): o Over the next 90 days, patient will work with PharmD and providers to achieve A1c goal <8% . Current regimen:  o Metformin ER 750 mg daily o Testing supplies . Interventions: o Discussed blood sugar goals and benefits of medications for prevention of diabetic complications o Recommended to increase metformin to twice a day (as suggested by PCP earlier this year) . Patient self care activities - Over the next 90 days, patient will: o Check blood sugar before breakfast, document, and provide at future appointments o Contact provider with any episodes of hypoglycemia  GERD . Pharmacist Clinical Goal(s) o Over the next 90 days, patient will work with PharmD and providers to optimize therapy . Current regimen:  o Omeprazole 20 mg daily . Interventions: o Discussed ability to taper omeprazole and see if GERD can be controlled without it; advised to reduce dose to every other day for 1 week, then stop . Patient self care activities - Over the next 90 days, patient will: o Taper omeprazole as above and monitor reflux symptoms; if symptoms return may resume omeprazole at any time  Gout . Pharmacist Clinical Goal(s) o Over the next 90 days, patient will work with PharmD and providers to optimize therapy . Current regimen:  o Allopurinol 300 mg daily o Colchicine 0.6 mg as needed for flares . Interventions: o Benefits of allopurinol for prevention of gout flares; pt denies gout issues for many years o Discussed low purine diet - handout provided o Discussed  ability for trial period off of allopurinol and monitor for symptoms of gout . Patient self care activities - Over the next 90 days, patient will: o Consider holding allopurinol and modifying diet to avoid purines  Overactive bladder . Pharmacist Clinical Goal(s) o Over the next 90 days, patient will work with PharmD and providers to optimize therapy . Current regimen:  o Solifenancin 5 mg daily (not  taking) . Interventions: o Discussed benefits of solifenacin; pt had confused it with sildenafil so was not taking; advised to take at bedtime to help with nocturia . Patient self care activities - Over the next 90 days, patient will: o Take solifenacin 5 mg daily at bedtime to help with nighttime urination  Glaucoma . Pharmacist Clinical Goal(s) o Over the next 90 days, patient will work with PharmD and providers to optimize therapy . Current regimen:  o Dorzolamide-timolol 22.3-6.8 mg/ml eye drops o Bimatoprost ([place]) 0.01% eye drops . Interventions: o Discussed insurance formulary may prefer alternatives; consulted formulary and latanoprost is preferred ($0 copay). Contacted eye doctor to potentially switch to more affordable option . Patient self care activities - Over the next 90 days, patient will: o Continue medications as prescribed  Medication management . Pharmacist Clinical Goal(s): o Over the next 90 days, patient will work with PharmD and providers to maintain optimal medication adherence . Current pharmacy: [facility] . Interventions o Comprehensive medication review performed. o Continue current medication management strategy o Advised to consult with Medicare expert to choose an optimal insurance plan for 2022 . Patient self care activities - Over the next 90 days, patient will: o Focus on medication adherence by fill date o Take medications as prescribed o Report any questions or concerns to PharmD and/or provider(s) o Contact [facility] [phone]) to set up a meeting with a Medicare advisor  Initial goal documentation      Jonathan [name] was given information about Chronic Care Management services today including:  1. CCM service includes personalized support from designated clinical staff supervised by his physician, including individualized plan of care and coordination with other care providers 2. 24/7 contact phone numbers for assistance for urgent and routine care  needs. 3. Standard insurance, coinsurance, copays and deductibles apply for chronic care management only during months in which we provide at least 20 minutes of these services. Most insurances cover these services at 100%, however patients may be responsible for any copay, coinsurance and/or deductible if applicable. This service may help you avoid the need for more expensive face-to-face services. 4. Only one practitioner may furnish and bill the service in a calendar month. 5. The patient may stop CCM services at any time (effective at the end of the month) by phone call to the office staff.  Patient agreed to services and verbal consent obtained.   Print copy of patient instructions provided.  Telephone follow up appointment with pharmacy team member scheduled for: 3 months  Charlene Brooke, PharmD, BCACP Clinical Pharmacist [facility] A low-purine eating plan involves making food choices to limit your intake of purine. Purine is a kind of uric acid. Too much uric acid in your blood can cause certain conditions, such as gout and kidney stones. Eating a low-purine diet can help control these conditions. What are tips for following this plan? Reading food labels   Avoid foods with saturated or Trans fat.  Check the ingredient list of grains-based foods, such as bread and cereal, to make  sure that they contain whole grains.  Check the ingredient list of sauces or soups to make sure they do not contain meat or fish.  When choosing soft drinks, check the ingredient list to make sure they do not contain high-fructose corn syrup. Shopping  Buy plenty of fresh fruits and vegetables.  Avoid buying canned or fresh fish.  Buy dairy products labeled as low-fat or nonfat.  Avoid buying premade or processed foods. These foods are often high in fat, salt (sodium), and added sugar. Cooking  Use olive oil instead of butter  when cooking. Oils like olive oil, canola oil, and sunflower oil contain healthy fats. Meal planning  Learn which foods do or do not affect you. If you find out that a food tends to cause your gout symptoms to flare up, avoid eating that food. You can enjoy foods that do not cause problems. If you have any questions about a food item, talk with your dietitian or health care provider.  Limit foods high in fat, especially saturated fat. Fat makes it harder for your body to get rid of uric acid.  Choose foods that are lower in fat and are lean sources of protein. General guidelines  Limit alcohol intake to no more than 1 drink a day for nonpregnant women and 2 drinks a day for men. One drink equals 12 oz of beer, 5 oz of wine, or 1 oz of hard liquor. Alcohol can affect the way your body gets rid of uric acid.  Drink plenty of water to keep your urine clear or pale yellow. Fluids can help remove uric acid from your body.  If directed by your health care provider, take a vitamin C supplement.  Work with your health care provider and dietitian to develop a plan to achieve or maintain a healthy weight. Losing weight can help reduce uric acid in your blood. What foods are recommended? The items listed may not be a complete list. Talk with your dietitian about what dietary choices are best for you. Foods low in purines Foods low in purines do not need to be limited. These include:  All fruits.  All low-purine vegetables, pickles, and olives.  Breads, pasta, rice, cornbread, and popcorn. Cake and other baked goods.  All dairy foods.  Eggs, nuts, and nut butters.  Spices and condiments, such as salt, herbs, and vinegar.  Plant oils, butter, and margarine.  Water, sugar-free soft drinks, tea, coffee, and cocoa.  Vegetable-based soups, broths, sauces, and gravies. Foods moderate in purines Foods moderate in purines should be limited to the amounts listed.   cup of asparagus,  cauliflower, spinach, mushrooms, or green peas, each day.  2/3 cup uncooked oatmeal, each day.   cup dry wheat bran or wheat germ, each day.  2-3 ounces of meat or poultry, each day.  4-6 ounces of shellfish, such as crab, lobster, oysters, or shrimp, each day.  1 cup cooked beans, peas, or lentils, each day.  Soup, broths, or bouillon made from meat or fish. Limit these foods as much as possible. What foods are not recommended? The items listed may not be a complete list. Talk with your dietitian about what dietary choices are best for you. Limit your intake of foods high in purines, including:  Beer and other alcohol.  Meat-based gravy or sauce.  Canned or fresh fish, such as: ? Anchovies, sardines, herring, and tuna. ? Mussels and scallops. ? Codfish, trout, and haddock.  Berniece Salines.  Organ meats, such as: ?  Liver or kidney. ? Tripe. ? Sweetbreads (thymus gland or pancreas).  Wild Clinical biochemist.  Yeast or yeast extract supplements.  Drinks sweetened with high-fructose corn syrup. Summary  Eating a low-purine diet can help control conditions caused by too much uric acid in the body, such as gout or kidney stones.  Choose low-purine foods, limit alcohol, and limit foods high in fat.  You will learn over time which foods do or do not affect you. If you find out that a food tends to cause your gout symptoms to flare up, avoid eating that food. This information is not intended to replace advice given to you by your health care provider. Make sure you discuss any questions you have with your health care provider. Document Revised: 05/14/2017 Document Reviewed: 07/15/2016 Elsevier Patient Education  2020 [employer].

## 2020-03-11 NOTE — Chronic Care Management (AMB) (Signed)
Chronic Care Management Pharmacy  Name: Barlow Harrison  MRN: [id number] DOB: 06-27-1932   Chief Complaint/ HPI  Jonathan [name],  86 y.o. , male presents for their Initial CCM visit with the clinical pharmacist In office.  PCP : Binnie Rail, MD Patient Care Team: Binnie Rail, MD as PCP - General (Internal Medicine) Stanford Breed Denice Bors, MD as PCP - Cardiology (Cardiology) Council Mechanic, MD as Referring Physician (Family Medicine) Earnie Larsson, MD as Consulting Physician (Neurosurgery) Stanford Breed Denice Bors, MD as Consulting Physician (Cardiology) Michael Boston, MD as Consulting Physician (General Surgery) Karen Kays, NP as Nurse Practitioner (Nurse Practitioner) [name], [facility] as Pharmacist (Pharmacist)  Their chronic conditions include: Hypertension, Hyperlipidemia, Diabetes, Atrial Fibrillation, GERD, COPD, Hypothyroidism, Depression, Osteoarthritis, BPH, Gout and Allergic Rhinitis, Glaucoma   Office Visits: 09/13/19 Dr Quay Burow OV: acute visit for nausea, may be related to constipation, rec'd Miralax daily.  08/14/19 Dr Quay Burow OV: chronic f/u. Alternating Ambien and Sonata (1 rx at a time). Advised to increase metformin to BID.  Consult Visit: 01/30/20 Dr Stanford Breed (cardiology): Afib in sinus rhythm. No med changes.  11/28/19 Dr Tonita Cong (ortho surgery): f/u for thoracic spine pain. Pt going to PT.  09/12/19 Dr Dwaine Gale (psychiatry): f/u for MDD. Continue same meds - duloxetine and bupropion. Increased bupropion to BID 2 weeks prior.  Allergies  Allergen Reactions  . Other Other (See Comments)    DUST-Other reaction(s): Respiratory distress    Medications: Outpatient Encounter Medications as of 03/11/2020  Medication Sig  . acetaminophen (TYLENOL) 500 MG tablet Take 2 tablets (1,000 mg total) by mouth every 8 (eight) hours as needed. (Patient taking differently: Take 650 mg by mouth 2 (two) times daily. )  . allopurinol (ZYLOPRIM) 300 MG tablet TAKE 1 TABLET BY  MOUTH IN  THE EVENING  . atorvastatin (LIPITOR) 20 MG tablet TAKE 1 TABLET BY MOUTH EVERY DAY  . bimatoprost (LUMIGAN) 0.01 % SOLN Place 1 drop into both eyes at bedtime.   . Biotin 5000 MCG TABS Take 5,000 mcg by mouth daily.  . blood glucose meter kit and supplies KIT Dispense based on patient and insurance preference. Use daily as directed. (FOR E11.9).  . calcium-vitamin D (OSCAL WITH D) 500-200 MG-UNIT tablet Take 1 tablet by mouth daily with breakfast.  . colchicine (COLCRYS) 0.6 MG tablet Take 0.6 mg by mouth daily as needed (flareups).   . diltiazem (CARDIZEM CD) 300 MG 24 hr capsule TAKE 1 CAPSULE BY MOUTH  DAILY (Patient taking differently: Take 300 mg by mouth daily. )  . dorzolamide-timolol (COSOPT) 22.3-6.8 MG/ML ophthalmic solution Place 1 drop into the left eye 2 (two) times daily.  Marland Kitchen doxazosin (CARDURA) 8 MG tablet TAKE 1 TABLET BY MOUTH EVERY DAY  . DULoxetine (CYMBALTA) 60 MG capsule Take 1 capsule (60 mg total) by mouth 2 (two) times daily.  Marland Kitchen ELIQUIS 5 MG TABS tablet TAKE 1 TABLET BY MOUTH TWICE A DAY  . glucose blood test strip Use to check blood sugar daily. E11.9  . hydrochlorothiazide (HYDRODIURIL) 25 MG tablet Take 1 tablet (25 mg total) by mouth daily.  Marland Kitchen KLOR-CON M20 20 MEQ tablet TAKE 1 TABLET BY MOUTH EVERY DAY  . Lancets MISC Use to check blood sugars daily. E11.9  . levothyroxine (SYNTHROID) 112 MCG tablet Take 1 tablet (112 mcg total) by mouth daily before breakfast.  . magnesium oxide (MAG-OX) 400 MG tablet Take 400 mg by mouth daily.  . Melatonin 5 MG TABS Take 5 mg  by mouth at bedtime.  . metFORMIN (GLUCOPHAGE-XR) 750 MG 24 hr tablet TAKE 1 TABLET (750 MG TOTAL) BY MOUTH DAILY WITH SUPPER.  . mometasone (NASONEX) 50 MCG/ACT nasal spray Place 2 sprays into the nose daily. (Patient taking differently: Place 2 sprays into the nose as needed. )  . Multiple Vitamin (MULTIVITAMIN) tablet Take 1 tablet by mouth daily.  . nitroGLYCERIN (NITROSTAT) 0.4 MG SL tablet  Place 1 tablet (0.4 mg total) under the tongue every 5 (five) minutes as needed for chest pain.  Marland Kitchen omeprazole (PRILOSEC) 20 MG capsule TAKE 1 CAPSULE BY MOUTH  DAILY  . oxyCODONE-acetaminophen (PERCOCET) 10-325 MG tablet Take 1 tablet by mouth every 8 (eight) hours as needed for pain.  . sildenafil (REVATIO) 20 MG tablet Take 40-100 mg by mouth daily as needed.  . solifenacin (VESICARE) 5 MG tablet Take 5 mg by mouth daily.  . vitamin C (ASCORBIC ACID) 500 MG tablet Take 500 mg by mouth 2 (two) times daily.   Marland Kitchen VITAMIN D, CHOLECALCIFEROL, PO Take 25 mcg by mouth daily.   Marland Kitchen zolpidem (AMBIEN) 10 MG tablet TAKE 1 TABLET BY MOUTH EVERY DAY AT BEDTIME AS NEEDED FOR SLEEP  . RESTASIS 0.05 % ophthalmic emulsion 1 drop 2 (two) times daily. (Patient not taking: Reported on 03/12/2020)  . zaleplon (SONATA) 10 MG capsule Take 1 capsule (10 mg total) by mouth at bedtime. (Patient not taking: Reported on 03/12/2020)   No facility-administered encounter medications on file as of 03/11/2020.    Wt Readings from Last 3 Encounters:  01/30/20 198 lb (89.8 kg)  09/13/19 195 lb (88.5 kg)  08/14/19 205 lb 12.8 oz (93.4 kg)    Current Diagnosis/Assessment:  SDOH Interventions     Most Recent Value  SDOH Interventions  Financial Strain Interventions Other (Comment)  [Eliquis PAP]      Goals Addressed            This Visit's Progress   . Pharmacy Care Plan       CARE PLAN ENTRY (see longitudinal plan of care for additional care plan information)  Current Barriers:  . Chronic Disease Management support, education, and care coordination needs related to Hypertension, Hyperlipidemia, Diabetes, GERD, Overactive Bladder, Gout, and Glaucoma   Hypertension BP Readings from Last 3 Encounters:  01/30/20 132/83  09/13/19 (!) 150/72  08/14/19 140/72 .  Pharmacist Clinical Goal(s): o Over the next 90 days, patient will work with PharmD and providers to maintain BP goal <140/90 . Current regimen:  o HCTZ  25 mg daily o Doxazosin 8 mg daily o Diltiazem 300 mg daily . Interventions: o Discussed BP goals and benefits of medications for prevention of heart attack / stroke . Patient self care activities - Over the next 90 days, patient will: o Check BP daily, document, and provide at future appointments o Ensure daily salt intake < 2300 mg/day  Hyperlipidemia Lab Results  Component Value Date/Time   LDLCALC 66 11/11/2018 11:14 AM   LDLDIRECT 51.0 08/14/2019 10:28 AM .  Pharmacist Clinical Goal(s): o Over the next 90 days, patient will work with PharmD and providers to maintain LDL goal < 100 . Current regimen:  o Atorvastatin 20 mg daily o Nitroglycerin 0.4 mg SL prn . Interventions: o Discussed cholesterol goals and benefits of medications for prevention of heart attack / stroke . Patient self care activities - Over the next 90 days, patient will: o Continue current medications  Diabetes Lab Results  Component Value Date/Time   HGBA1C  8.0 (H) 08/14/2019 10:28 AM   HGBA1C 7.4 (H) 02/09/2019 10:36 AM .  Pharmacist Clinical Goal(s): o Over the next 90 days, patient will work with PharmD and providers to achieve A1c goal <8% . Current regimen:  o Metformin ER 750 mg daily o Testing supplies . Interventions: o Discussed blood sugar goals and benefits of medications for prevention of diabetic complications o Recommended to increase metformin to twice a day (as suggested by PCP earlier this year) . Patient self care activities - Over the next 90 days, patient will: o Check blood sugar before breakfast, document, and provide at future appointments o Contact provider with any episodes of hypoglycemia  GERD . Pharmacist Clinical Goal(s) o Over the next 90 days, patient will work with PharmD and providers to optimize therapy . Current regimen:  o Omeprazole 20 mg daily . Interventions: o Discussed ability to taper omeprazole and see if GERD can be controlled without it; advised to  reduce dose to every other day for 1 week, then stop . Patient self care activities - Over the next 90 days, patient will: o Taper omeprazole as above and monitor reflux symptoms; if symptoms return may resume omeprazole at any time  Gout . Pharmacist Clinical Goal(s) o Over the next 90 days, patient will work with PharmD and providers to optimize therapy . Current regimen:  o Allopurinol 300 mg daily o Colchicine 0.6 mg as needed for flares . Interventions: o Benefits of allopurinol for prevention of gout flares; pt denies gout issues for many years o Discussed low purine diet - handout provided o Discussed ability for trial period off of allopurinol and monitor for symptoms of gout . Patient self care activities - Over the next 90 days, patient will: o Consider holding allopurinol and modifying diet to avoid purines  Overactive bladder . Pharmacist Clinical Goal(s) o Over the next 90 days, patient will work with PharmD and providers to optimize therapy . Current regimen:  o Solifenancin 5 mg daily (not taking) . Interventions: o Discussed benefits of solifenacin; pt had confused it with sildenafil so was not taking; advised to take at bedtime to help with nocturia . Patient self care activities - Over the next 90 days, patient will: o Take solifenacin 5 mg daily at bedtime to help with nighttime urination  Glaucoma . Pharmacist Clinical Goal(s) o Over the next 90 days, patient will work with PharmD and providers to optimize therapy . Current regimen:  o Dorzolamide-timolol 22.3-6.8 mg/ml eye drops o Bimatoprost (Lumigan) 0.01% eye drops . Interventions: o Discussed insurance formulary may prefer alternatives; consulted formulary and latanoprost is preferred ($0 copay). Contacted eye doctor to potentially switch to more affordable option . Patient self care activities - Over the next 90 days, patient will: o Continue medications as prescribed  Medication  management . Pharmacist Clinical Goal(s): o Over the next 90 days, patient will work with PharmD and providers to maintain optimal medication adherence . [facility] . Interventions o Comprehensive medication review performed. o Continue current medication management strategy o Advised to consult with Medicare expert to choose an optimal insurance plan for 2022 . Patient self care activities - Over the next 90 days, patient will: o Focus on medication adherence by fill date o Take medications as prescribed o Report any questions or concerns to PharmD and/or provider(s) o Contact [facility] [phone]) to set up a meeting with a Medicare advisor  Initial goal documentation       AFIB   Patient  is currently rate controlled. Office heart rates are  Pulse Readings from Last 3 Encounters:  01/30/20 80  09/13/19 65  08/14/19 (!) 59   CHA2DS2-VASc Score = 4  The patient's score is based upon: CHF History: 0 HTN History: 1 Age : 2 Diabetes History: 1 Stroke History: 0 Vascular Disease History: 0 Gender: 0  Kidney Function Lab Results  Component Value Date/Time   CREATININE 1.03 09/18/2019 09:40 AM   CREATININE 1.00 08/14/2019 10:28 AM   CREATININE 1.07 09/02/2016 11:44 AM   CREATININE 0.99 08/17/2015 01:08 PM   GFR 68.75 09/18/2019 09:40 AM   GFRNONAA >60 05/13/2019 04:41 AM   GFRNONAA 82 04/29/2015 02:30 PM   GFRAA >60 05/13/2019 04:41 AM   GFRAA >89 04/29/2015 02:30 PM   K 3.8 09/18/2019 09:40 AM   K 4.1 08/14/2019 10:28 AM   Patient has failed these meds in past: n/a Patient is currently controlled on the following medications:  . Diltiazem 300 mg daily . Eliquis 5 mg BID  We discussed:  Benefits of medications for prevention of stroke and maintenance of sinus rhythm; pt reports Eliquis is $300 every time he fills it (not just in deductible or coverage gap). Per patient reported income and spending he should qualify for patient assistance. Will work with  prescriber and patient to complete and submit application.  Plan  Continue current medications  Pursue Eliquis PAP (with cardiology)  Hypertension   BP goal is:  <140/90  Office blood pressures are  BP Readings from Last 3 Encounters:  01/30/20 132/83  09/13/19 (!) 150/72  08/14/19 140/72   Kidney Function Lab Results  Component Value Date/Time   CREATININE 1.03 09/18/2019 09:40 AM   CREATININE 1.00 08/14/2019 10:28 AM   CREATININE 1.07 09/02/2016 11:44 AM   CREATININE 0.99 08/17/2015 01:08 PM   GFR 68.75 09/18/2019 09:40 AM   GFRNONAA >60 05/13/2019 04:41 AM   GFRNONAA 82 04/29/2015 02:30 PM   GFRAA >60 05/13/2019 04:41 AM   GFRAA >89 04/29/2015 02:30 PM   K 3.8 09/18/2019 09:40 AM   K 4.1 08/14/2019 10:28 AM   Patient checks BP at home daily Patient home BP readings are ranging: 130/85, HR 70s  Patient has failed these meds in the past: n/a Patient is currently controlled on the following medications:  . HCTZ 25 mg daily . Doxazosin 8 mg daily . Diltiazem 300 mg daily  We discussed diet and exercise extensively; benefits of medications for prevention of heart attack, stroke and renal damage. Pt denies side effects  Plan  Continue current medications and control with diet and exercise    Hyperlipidemia   LDL goal < 100  Lipid Panel     Component Value Date/Time   CHOL 111 08/14/2019 1028   TRIG 211.0 (H) 08/14/2019 1028   HDL 37.60 (L) 08/14/2019 1028   LDLCALC 66 11/11/2018 1114   LDLDIRECT 51.0 08/14/2019 1028    Hepatic Function Latest Ref Rng & Units 09/18/2019 08/14/2019 05/13/2019  Total Protein 6.0 - 8.3 g/dL 6.7 6.2 5.8(L)  Albumin 3.5 - 5.2 g/dL 4.5 3.9 3.2(L)  AST 0 - 37 U/L 13 17 12(L)  ALT 0 - 53 U/L _0 Alk Phosphatase 39 - 117 U/L 91 87 83  Total Bilirubin 0.2 - 1.2 mg/dL 0.5 0.3 0.4     The ASCVD Risk score (Alva., et al., 2013) failed to calculate for the following reasons:   The 2013 ASCVD risk score is only valid  for  ages 65 to 18   Patient has failed these meds in past: n/a Patient is currently controlled on the following medications:  . Atorvastatin 20 mg daily . Nitroglycerin 0.4 mg SL prn  We discussed:  diet and exercise extensively; Cholesterol goals; benefits of statin for ASCVD risk reduction; pt has never had to use nitroglycerin; he denies side effects  Plan  Continue current medications and control with diet and exercise  Diabetes   A1c goal <8%  Recent Relevant Labs: Lab Results  Component Value Date/Time   HGBA1C 8.0 (H) 08/14/2019 10:28 AM   HGBA1C 7.4 (H) 02/09/2019 10:36 AM   GFR 68.75 09/18/2019 09:40 AM   GFR 71.16 08/14/2019 10:28 AM   MICROALBUR 28.1 (H) 08/11/2017 08:48 AM   MICROALBUR 21.2 (H) 12/08/2016 11:43 AM    Last diabetic Eye exam:  Lab Results  Component Value Date/Time   HMDIABEYEEXA No Retinopathy 02/01/2019 12:00 AM    Last diabetic Foot exam: No results found for: HMDIABFOOTEX   Checking BG: Weekly  Recent FBG Readings: 150  Patient has failed these meds in past: n/a Patient is currently controlled on the following medications: Marland Kitchen Metformin ER 750 mg daily . Testing supplies  We discussed: diet and exercise extensively; A1c and BG goals; benefits of increasing metformin dose; pt was not aware PCP had advised this after most recent visit, he is agreeable to increasing the dose.   Plan  Continue control with diet and exercise  Recommend to increase metformin to 750 mg BID (previously advised by PCP)   COPD   No spirometry on file  Gold Grade: n/a Current COPD Classification:  A (low sx, <2 exacerbations/yr)   Lab Results  Component Value Date/Time   EOSPCT 0.5 09/18/2019 09:40 AM   EOSABS 0.0 09/18/2019 09:40 AM   Patient has failed these meds in past: [place], [place], [place], [place] Patient is currently controlled on the following medications:  . No medications  Using maintenance inhaler regularly? No Frequency of rescue  inhaler use:  never  We discussed:  Pt denies issues with breathing, wheezing, coughing; he could not afford inhalers previously and he would like to continue to monitor symptoms without treatment at this time  Plan  Continue to monitor symptoms  Hypothyroidism   Lab Results  Component Value Date/Time   TSH 3.13 08/14/2019 10:28 AM   TSH 1.702 05/08/2019 03:24 AM   TSH 1.99 02/09/2019 10:36 AM   Patient has failed these meds in past: n/a Patient is currently controlled on the following medications:  . Levothyroxine 112 mcg daily  We discussed:  Pt takes levothyroxine 30 minutes prior to eating or other meds in the morning  Plan  Continue current medications  Depression / Insomnia   Depression screen [facility] 2/9 08/14/2019 04/26/2019 04/20/2018  Decreased Interest _0 Down, Depressed, Hopeless _1 PHQ - 2 Score _2 Altered sleeping _3 Tired, decreased energy _4 Change in appetite 2 0 0  Feeling bad or failure about yourself  _5 Trouble concentrating 3 0 1  Moving slowly or fidgety/restless 0 0 0  Suicidal thoughts 0 0 0  PHQ-9 Score _6 Difficult doing work/chores - Not difficult at all Somewhat difficult  Some recent data might be hidden    Patient has failed these meds in past: bupropion, sertraline, Belsomra, trazodone Patient is currently controlled on the following medications:  .  Duloxetine 60 mg BID . Zaleplon 10 mg HS (not taking) . Zolpidem 10 mg HS prn  We discussed:  Pt reports improvement in mood since increasing duloxetine in August. He alternates between zaleplon and zolpidem for sleep, currently taking zolpidem with moderate efficacy  Plan  Continue current medications  GERD   Patient has failed these meds in past: n/a Patient is currently controlled on the following medications:  . Omeprazole 20 mg daily  We discussed:  Pt denies issues with heartburn/reflux and is interested in reducing medications; advised to taper PPI  before stopping, and monitor symptoms, he may restart PPI at any time  Plan  Recommended to reduce omeprazole to every other day for 1 week, then stop  BPH / OAB   No results found for: PSA   Patient has failed these meds in past: n/a Patient is currently uncontrolled on the following medications:  . Solifenacin 5 mg daily  - not taking . Doxazosin 8 mg daily  We discussed:  Benefits of solifenacin for OAB; pt had confused solifenacin with sildenafil so he was not taking it regularly; he does report 2-3 episodes of nighttime urination  Plan  Recommended to restart solifenacin 5 mg at bedtime  Chronic pain   Spondylosthesis Osteoarthritis Compression fracture of vertebra Degenerative joint disease Fibromyalgia  Patient has failed these meds in past: n/a Patient is currently controlled on the following medications:  . Tylenol 500 mg q8h PRN . Oxycodone-APAP 10-325 mg q8h PRN . Oxycodone 15 mg - 1/2 tablet PRN . Duloxetine 60 mg BID . Voltaren gel PRN  We discussed:  Pt endorses long history of chronic pain since he was a young adult; currently pain is relatively controlled with medications and injections via orthopedist; he was not using Voltaren gel regularly so advised him to use it daily for joint pains  Plan  Continue current medications  Gout     Ref. Range 02/25/2012 17:32 11/23/2012 09:33  Uric Acid, Serum Latest Ref Range: 4.0 - 7.8 mg/dL 5.0 4.8   Patient has failed these meds in past: n/a Patient is currently controlled on the following medications:  . Allopurinol 300 mg daily . Colchicine 0.6 mg PRN  We discussed:  Pt reports he has not had a gout flare in many years; he is interested in taking less medication so discussed the risks of stopping allopurinol including return of gout flares; discussed low purine diet; pt would like to try a period off of allopurinol   Plan  Recommend trial period off allopurinol Low purine diet (handout  provided)  Allergic rhinitis   Patient has failed these meds in past: n/a Patient is currently controlled on the following medications:  Marland Kitchen Mometasone nasal spray PRN  We discussed:  Patient is satisfied with current regimen and denies issues  Plan  Continue current medications  Glaucoma   Patient has failed these meds in past: n/a Patient is currently controlled on the following medications:  . Bimatoprost (Lumigan) 0.01% eye drops HS . Dorzolamide-timolol (Cosopt) eye drops BID . Restasis 0.05% eye drops BID -not taking  We discussed: Pt reports eye drops are very expensive. Consulted insurance formulary - latanoprost is the preferred product for $0 copay. Pt is interested in more cost-effective options.  Plan  Consider switch from Bimatoprost to Latanoprost for cost savings - will contact ophthalmologist   Health Maintenance   Patient is currently controlled on the following medications:  . Biotin 5000 mcg . Calcium-Vitamin D 500-200 mg-unit . [place]-con  20 mEq daily . Multivitamin . Vitamin C 500 mg . Vitamin D 1000 IU . Magnesium oxide 400 mg daily  We discussed:  Patient is satisfied with current OTC regimen and denies issues  Plan  Continue current medications  Medication Management   [facility] Uses pill box? No - keeps in bottles and marks AM or PM Pt endorses 90% compliance  We discussed: Discussed benefits of medication synchronization, packaging and delivery as well as enhanced pharmacist oversight with [employer]. He is interested however he has just recently switched to [place] for cost savings with [facility] - he reports nearly all meds are cheaper with [facility] than through insurance. Advised patient to meet with [facility] counselor to choose a more optimal insurance plan given his affordability issues.  Plan  Continue current medication management strategy  Pt to meet with [facility] counselor to choose more  cost-effective insurance plan    Follow up: 3 month phone visit  Charlene Brooke, PharmD, [facility] Clinical Pharmacist [facility] [phone]

## 2020-03-12 ENCOUNTER — Other Ambulatory Visit: Payer: Self-pay

## 2020-03-12 DIAGNOSIS — S60212A Contusion of left wrist, initial encounter: Secondary | ICD-10-CM | POA: Diagnosis not present

## 2020-03-12 DIAGNOSIS — M1712 Unilateral primary osteoarthritis, left knee: Secondary | ICD-10-CM | POA: Diagnosis not present

## 2020-03-12 DIAGNOSIS — M25562 Pain in left knee: Secondary | ICD-10-CM | POA: Diagnosis not present

## 2020-03-12 DIAGNOSIS — M25531 Pain in right wrist: Secondary | ICD-10-CM | POA: Diagnosis not present

## 2020-03-12 DIAGNOSIS — I10 Essential (primary) hypertension: Secondary | ICD-10-CM

## 2020-03-14 ENCOUNTER — Observation Stay (HOSPITAL_COMMUNITY): Payer: Medicare Other

## 2020-03-14 ENCOUNTER — Emergency Department (HOSPITAL_BASED_OUTPATIENT_CLINIC_OR_DEPARTMENT_OTHER): Payer: Medicare Other

## 2020-03-14 ENCOUNTER — Encounter (HOSPITAL_BASED_OUTPATIENT_CLINIC_OR_DEPARTMENT_OTHER): Payer: Self-pay | Admitting: Emergency Medicine

## 2020-03-14 ENCOUNTER — Other Ambulatory Visit: Payer: Self-pay

## 2020-03-14 ENCOUNTER — Observation Stay (HOSPITAL_BASED_OUTPATIENT_CLINIC_OR_DEPARTMENT_OTHER)
Admission: EM | Admit: 2020-03-14 | Discharge: 2020-03-15 | Disposition: A | Discharge status: Home / Self Care | Payer: Medicare Other | Attending: Family Medicine | Admitting: Family Medicine

## 2020-03-14 ENCOUNTER — Other Ambulatory Visit: Payer: Self-pay | Admitting: Specialist

## 2020-03-14 DIAGNOSIS — R410 Disorientation, unspecified: Secondary | ICD-10-CM | POA: Diagnosis present

## 2020-03-14 DIAGNOSIS — I629 Nontraumatic intracranial hemorrhage, unspecified: Principal | ICD-10-CM

## 2020-03-14 DIAGNOSIS — R05 Cough: Secondary | ICD-10-CM | POA: Diagnosis not present

## 2020-03-14 DIAGNOSIS — E119 Type 2 diabetes mellitus without complications: Secondary | ICD-10-CM | POA: Diagnosis not present

## 2020-03-14 DIAGNOSIS — D6832 Hemorrhagic disorder due to extrinsic circulating anticoagulants: Secondary | ICD-10-CM | POA: Diagnosis not present

## 2020-03-14 DIAGNOSIS — J449 Chronic obstructive pulmonary disease, unspecified: Secondary | ICD-10-CM | POA: Diagnosis not present

## 2020-03-14 DIAGNOSIS — R296 Repeated falls: Secondary | ICD-10-CM | POA: Diagnosis not present

## 2020-03-14 DIAGNOSIS — S06360A Traumatic hemorrhage of cerebrum, unspecified, without loss of consciousness, initial encounter: Secondary | ICD-10-CM | POA: Diagnosis not present

## 2020-03-14 DIAGNOSIS — E039 Hypothyroidism, unspecified: Secondary | ICD-10-CM | POA: Diagnosis not present

## 2020-03-14 DIAGNOSIS — M47812 Spondylosis without myelopathy or radiculopathy, cervical region: Secondary | ICD-10-CM | POA: Diagnosis not present

## 2020-03-14 DIAGNOSIS — I482 Chronic atrial fibrillation, unspecified: Secondary | ICD-10-CM | POA: Diagnosis present

## 2020-03-14 DIAGNOSIS — Z7901 Long term (current) use of anticoagulants: Secondary | ICD-10-CM | POA: Insufficient documentation

## 2020-03-14 DIAGNOSIS — M546 Pain in thoracic spine: Secondary | ICD-10-CM

## 2020-03-14 DIAGNOSIS — Z20822 Contact with and (suspected) exposure to covid-19: Secondary | ICD-10-CM | POA: Insufficient documentation

## 2020-03-14 DIAGNOSIS — G9389 Other specified disorders of brain: Secondary | ICD-10-CM | POA: Diagnosis not present

## 2020-03-14 DIAGNOSIS — I615 Nontraumatic intracerebral hemorrhage, intraventricular: Secondary | ICD-10-CM | POA: Diagnosis not present

## 2020-03-14 DIAGNOSIS — W19XXXD Unspecified fall, subsequent encounter: Secondary | ICD-10-CM

## 2020-03-14 DIAGNOSIS — I10 Essential (primary) hypertension: Secondary | ICD-10-CM | POA: Diagnosis not present

## 2020-03-14 DIAGNOSIS — D72829 Elevated white blood cell count, unspecified: Secondary | ICD-10-CM

## 2020-03-14 DIAGNOSIS — R059 Cough, unspecified: Secondary | ICD-10-CM

## 2020-03-14 DIAGNOSIS — Y92009 Unspecified place in unspecified non-institutional (private) residence as the place of occurrence of the external cause: Secondary | ICD-10-CM

## 2020-03-14 DIAGNOSIS — W1839XA Other fall on same level, initial encounter: Secondary | ICD-10-CM | POA: Diagnosis not present

## 2020-03-14 DIAGNOSIS — Z87891 Personal history of nicotine dependence: Secondary | ICD-10-CM | POA: Insufficient documentation

## 2020-03-14 DIAGNOSIS — S0993XA Unspecified injury of face, initial encounter: Secondary | ICD-10-CM | POA: Diagnosis not present

## 2020-03-14 DIAGNOSIS — M4312 Spondylolisthesis, cervical region: Secondary | ICD-10-CM | POA: Diagnosis not present

## 2020-03-14 DIAGNOSIS — M4802 Spinal stenosis, cervical region: Secondary | ICD-10-CM | POA: Diagnosis not present

## 2020-03-14 DIAGNOSIS — I4891 Unspecified atrial fibrillation: Secondary | ICD-10-CM | POA: Diagnosis not present

## 2020-03-14 HISTORY — DX: Elevated white blood cell count, unspecified: D72.829

## 2020-03-14 LAB — URINALYSIS, MICROSCOPIC (REFLEX): Squamous Epithelial / HPF: NONE SEEN (ref 0–5)

## 2020-03-14 LAB — CBC
HCT: 41.3 % (ref 39.0–52.0)
Hemoglobin: 13.3 g/dL (ref 13.0–17.0)
MCH: 29.4 pg (ref 26.0–34.0)
MCHC: 32.2 g/dL (ref 30.0–36.0)
MCV: 91.2 fL (ref 80.0–100.0)
Platelets: 125 10*3/uL — ABNORMAL LOW (ref 150–400)
RBC: 4.53 MIL/uL (ref 4.22–5.81)
RDW: 13.2 % (ref 11.5–15.5)
WBC: 17 10*3/uL — ABNORMAL HIGH (ref 4.0–10.5)
nRBC: 0 % (ref 0.0–0.2)

## 2020-03-14 LAB — URINALYSIS, ROUTINE W REFLEX MICROSCOPIC
Bilirubin Urine: NEGATIVE
Glucose, UA: 100 mg/dL — AB
Hgb urine dipstick: NEGATIVE
Ketones, ur: NEGATIVE mg/dL
Leukocytes,Ua: NEGATIVE
Nitrite: NEGATIVE
Protein, ur: 100 mg/dL — AB
Specific Gravity, Urine: 1.015 (ref 1.005–1.030)
pH: 8 (ref 5.0–8.0)

## 2020-03-14 LAB — COMPREHENSIVE METABOLIC PANEL
ALT: 21 U/L (ref 0–44)
AST: 24 U/L (ref 15–41)
Albumin: 4.5 g/dL (ref 3.5–5.0)
Alkaline Phosphatase: 88 U/L (ref 38–126)
Anion gap: 7 (ref 5–15)
BUN: 26 mg/dL — ABNORMAL HIGH (ref 8–23)
CO2: 30 mmol/L (ref 22–32)
Calcium: 10.1 mg/dL (ref 8.9–10.3)
Chloride: 102 mmol/L (ref 98–111)
Creatinine, Ser: 1.04 mg/dL (ref 0.61–1.24)
GFR calc Af Amer: >60 mL/min (ref >60)
GFR calc non Af Amer: >60 mL/min (ref >60)
Glucose, Bld: 266 mg/dL — ABNORMAL HIGH (ref 70–99)
Potassium: 4.2 mmol/L (ref 3.5–5.1)
Sodium: 139 mmol/L (ref 135–145)
Total Bilirubin: 0.4 mg/dL (ref 0.3–1.2)
Total Protein: 6.7 g/dL (ref 6.5–8.1)

## 2020-03-14 LAB — HEMOGLOBIN A1C
Hgb A1c MFr Bld: 8.1 % — ABNORMAL HIGH (ref 4.8–5.6)
Mean Plasma Glucose: 185.77 mg/dL

## 2020-03-14 LAB — PROTIME-INR
INR: 0.9 (ref 0.8–1.2)
Prothrombin Time: 12.2 seconds (ref 11.4–15.2)

## 2020-03-14 LAB — CBG MONITORING, ED: Glucose-Capillary: 185 mg/dL — ABNORMAL HIGH (ref 70–99)

## 2020-03-14 LAB — RESPIRATORY PANEL BY RT PCR (FLU A&B, COVID)
Influenza A by PCR: NEGATIVE
Influenza B by PCR: NEGATIVE
SARS Coronavirus 2 by RT PCR: NEGATIVE

## 2020-03-14 LAB — GLUCOSE, CAPILLARY: Glucose-Capillary: 231 mg/dL — ABNORMAL HIGH (ref 70–99)

## 2020-03-14 MED ORDER — LABETALOL HCL 5 MG/ML IV SOLN
5.0000 mg | Freq: Once | INTRAVENOUS | Status: AC
  Administered 2020-03-14: 5 mg via INTRAVENOUS
  Filled 2020-03-14: qty 4

## 2020-03-14 MED ORDER — LABETALOL HCL 5 MG/ML IV SOLN: 5.0000 mg | INTRAVENOUS | Status: DC | PRN

## 2020-03-14 MED ORDER — SODIUM CHLORIDE 0.9 % IV SOLN: INTRAVENOUS | Status: DC | PRN

## 2020-03-14 MED ORDER — IPRATROPIUM-ALBUTEROL 0.5-2.5 (3) MG/3ML IN SOLN: 3.0000 mL | Freq: Four times a day (QID) | RESPIRATORY_TRACT | Status: DC | PRN

## 2020-03-14 MED ORDER — ACETAMINOPHEN 650 MG RE SUPP: 650.0000 mg | Freq: Four times a day (QID) | RECTAL | Status: DC | PRN

## 2020-03-14 MED ORDER — ACETAMINOPHEN 325 MG PO TABS: 650.0000 mg | ORAL_TABLET | Freq: Four times a day (QID) | ORAL | Status: DC | PRN

## 2020-03-14 MED ORDER — INSULIN ASPART 100 UNIT/ML ~~LOC~~ SOLN
0.0000 [IU] | Freq: Three times a day (TID) | SUBCUTANEOUS | Status: DC
  Administered 2020-03-15: 2 [IU] via SUBCUTANEOUS
  Administered 2020-03-15: 1 [IU] via SUBCUTANEOUS

## 2020-03-14 MED ORDER — PROTHROMBIN COMPLEX CONC HUMAN 500 UNITS IV KIT
4527.0000 [IU] | PACK | Status: AC
  Administered 2020-03-14: 4527 [IU] via INTRAVENOUS
  Filled 2020-03-14: qty 4527

## 2020-03-14 MED ORDER — INSULIN ASPART 100 UNIT/ML ~~LOC~~ SOLN: 0.0000 [IU] | Freq: Every day | SUBCUTANEOUS | Status: DC

## 2020-03-14 MED ORDER — PROTHROMBIN COMPLEX CONC HUMAN 500 UNITS IV KIT
1500.0000 [IU] | PACK | Status: DC
  Filled 2020-03-14: qty 1500

## 2020-03-14 MED ORDER — VITAMIN K1 10 MG/ML IJ SOLN
10.0000 mg | INTRAVENOUS | Status: DC
  Filled 2020-03-14: qty 1

## 2020-03-14 NOTE — ED Provider Notes (Signed)
[facility] Emergency Department Provider Note MRN:  [id number]  Arrival date & time: 03/14/20     Chief Complaint   Falls and Memory loss   History of Present Illness   Jonathan [name] is a 86 y.o. year-old male with a history of A. fib on Eliquis, COPD presenting to the ED with chief complaint of fall.  Patient explains he has been having vivid dreams for the past 2 nights that has caused him to wake up with confusion.  He has fallen 3 times this evening, does not recall falling noted tenderness to the left side of the head.  Currently denies headache or vision change, no apparent confusion, feels back to normal, no neck pain, no back pain, no chest pain, no shortness of breath, no abdominal pain, no numbness or weakness to the arms or legs.  Review of Systems  A complete 10 system review of systems was obtained and all systems are negative except as noted in the HPI and PMH.   Patient's Health History    Past Medical History:  Diagnosis Date  . Atrial fibrillation (HCC)    a. paroxysmal, on Eliquis for anticoagulation  . BPH (benign prostatic hyperplasia)   . Bronchitis   . Cataract   . COPD (chronic obstructive pulmonary disease) (HCC)    2 liters O2 HS  . Depression   . Fatty tumor    waste and back  . Fibromyalgia   . GERD (gastroesophageal reflux disease)   . Glaucoma    bilateral eyes  . Gout   . Hereditary and idiopathic peripheral neuropathy 08/28/2015  . Hypertension   . Hypothyroidism   . Hypoxia   . Insomnia   . Osteoarthritis   . Osteoporosis   . Peripheral neuropathy   . Sleep apnea    uses cpap-add oxygen at night  . Spinal compression fracture (HCC) seventh vertebre  . Transient alteration of awareness 08/28/2015  . Type 2 diabetes mellitus ([place]) 03/08/2016    Past Surgical History:  Procedure Laterality Date  . APPENDECTOMY  age 5  . CARPAL TUNNEL RELEASE Right    early 2000s  . CATARACT EXTRACTION     bilateral  .  CHOLECYSTECTOMY  age 36  . EYE SURGERY    . FOOT ARTHRODESIS Right 02/02/2013   Procedure: RIGHT HALLUX METATARSAL PHALANGEAL JOINT ARTHRODESIS ;  Surgeon: Wylene Simmer, MD;  Location: [facility];  Service: Orthopedics;  Laterality: Right;  . INGUINAL HERNIA REPAIR  age 76   rt side  . NASAL CONCHA BULLOSA RESECTION  age 28  . PROSTATE SURGERY    . SHOULDER ARTHROSCOPY W/ ROTATOR CUFF REPAIR Right    early 2000s  . tonsil    . VASECTOMY  age 13    Family History  Problem Relation Age of Onset  . Coronary artery disease Brother   . Heart disease Father   . Lung cancer Father   . Kidney cancer Father   . Prostate cancer Father   . Arthritis Mother   . Lung cancer Mother   . Dementia Mother        Unspecified type, not Alzheimer's disease    Social History   Socioeconomic History  . Marital status: Married    Spouse name: Not on file  . Number of children: 2  . Years of education: 55  . Highest education level: Master's degree (e.g., MA, MS, MEng, MEd, MSW, MBA)  Occupational History  . Occupation: RETIRED  Employer: RETIRED  Tobacco Use  . Smoking status: Former Smoker    Packs/day: 2.00    Years: 10.00    Pack years: 20.00    Types: Cigarettes    Quit date: 07/18/1975    Years since quitting: 44.6  . Smokeless tobacco: Never Used  Vaping Use  . Vaping Use: Never used  Substance and Sexual Activity  . Alcohol use: Yes    Comment: 1-2 drinks/day  . Drug use: No  . Sexual activity: Not Currently  Other Topics Concern  . Not on file  Social History Narrative  . Not on file   Social Determinants of Health   Financial Resource Strain: Medium Risk  . Difficulty of Paying Living Expenses: Somewhat hard  Food Insecurity:   . Worried About Charity fundraiser in the Last Year: Not on file  . Ran Out of Food in the Last Year: Not on file  Transportation Needs:   . Lack of Transportation (Medical): Not on file  . Lack of Transportation (Non-Medical):  Not on file  Physical Activity:   . Days of Exercise per Week: Not on file  . Minutes of Exercise per Session: Not on file  Stress:   . Feeling of Stress : Not on file  Social Connections:   . Frequency of Communication with Friends and Family: Not on file  . Frequency of Social Gatherings with Friends and Family: Not on file  . Attends Religious Services: Not on file  . Active Member of Clubs or Organizations: Not on file  . Attends Archivist Meetings: Not on file  . Marital Status: Not on file  Intimate Partner Violence:   . Fear of Current or Ex-Partner: Not on file  . Emotionally Abused: Not on file  . Physically Abused: Not on file  . Sexually Abused: Not on file     Physical Exam   Vitals:   03/14/20 1400 03/14/20 1511  BP: (!) 147/78 (!) 156/72  Pulse: 73   Resp: 14 17  Temp:    SpO2: 100%     CONSTITUTIONAL: Well-appearing, NAD NEURO:  Alert and oriented x 3, normal and symmetric strength and sensation, normal coordination, normal speech EYES:  eyes equal and reactive ENT/NECK:  no LAD, no JVD CARDIO: Regular rate, well-perfused, normal S1 and S2 PULM:  CTAB no wheezing or rhonchi GI/GU:  normal bowel sounds, non-distended, non-tender MSK/SPINE:  No gross deformities, no edema SKIN:  no rash, atraumatic PSYCH:  Appropriate speech and behavior  *Additional and/or pertinent findings included in MDM below  Diagnostic and Interventional Summary    EKG Interpretation  Date/Time:  Thursday March 14 2020 12:53:19 EDT Ventricular Rate:  72 PR Interval:    QRS Duration: 81 QT Interval:  396 QTC Calculation: 434 R Axis:   64 Text Interpretation: Atrial fibrillation Borderline low voltage, extremity leads Probable anteroseptal infarct, old Confirmed by Gerlene Fee [id number]) on 03/14/2020 3:18:55 PM      Labs Reviewed  CBC - Abnormal; Notable for the following components:      Result Value   WBC 17.0 (*)    Platelets 125 (*)    All other  components within normal limits  COMPREHENSIVE METABOLIC PANEL - Abnormal; Notable for the following components:   Glucose, Bld 266 (*)    BUN 26 (*)    All other components within normal limits  RESPIRATORY PANEL BY RT PCR (FLU A&B, COVID)  URINALYSIS, ROUTINE W REFLEX MICROSCOPIC  PROTIME-INR  CT HEAD WO CONTRAST  Final Result    CT CERVICAL SPINE WO CONTRAST  Final Result      Medications  prothrombin complex conc human (KCENTRA) IVPB 4,527 Units (4,527 Units Intravenous New Bag/Given 03/14/20 1512)  0.9 %  sodium chloride infusion ( Intravenous New Bag/Given 03/14/20 1508)     Procedures  /  Critical Care .Critical Care Performed by: Maudie Flakes, MD Authorized by: Maudie Flakes, MD   Critical care provider statement:    Critical care time (minutes):  45   Critical care was necessary to treat or prevent imminent or life-threatening deterioration of the following conditions:  CNS failure or compromise (Intracranial bleeding)   Critical care was time spent personally by me on the following activities:  Discussions with consultants, evaluation of patient's response to treatment, examination of patient, ordering and performing treatments and interventions, ordering and review of laboratory studies, ordering and review of radiographic studies, pulse oximetry, re-evaluation of patient's condition, obtaining history from patient or surrogate and review of old charts    ED Course and Medical Decision Making  I have reviewed the triage vital signs, the nursing notes, and pertinent available records from the EMR.  Listed above are laboratory and imaging tests that I personally ordered, reviewed, and interpreted and then considered in my medical decision making (see below for details).  Fall, head trauma, intraventricular bleed noted on CT imaging.  Unclear if mechanical versus syncopal fall, patient seems to have been acting out his dreams.  On Eliquis, patient is unsure if he  took his Eliquis this morning, definitely took it last night.  We will reversed with [facility].  [facility] [place] [facility] agrees with reversal, recommending admission and observation and repeat CT head in the morning.       Barth Kirks. [place]onia Small, [place] [email or web address]  Final Clinical Impressions(s) / ED Diagnoses     ICD-10-CM   1. Intracranial bleeding (HCC)  I62.9   2. Anticoagulated  Z79.01     ED Discharge Orders    None       Discharge Instructions Discussed with and Provided to Patient:   Discharge Instructions   None       Maudie Flakes, MD 03/14/20 [phone]

## 2020-03-14 NOTE — H&P (Signed)
History and Physical    Jonathan [name]:[id number] DOB: [birth year] DOA: 03/14/2020  PCP: Binnie Rail, MD Patient coming from: [facility]  Chief Complaint: Multiple falls  HPI: Jonathan [name] is a 86 y.o. male with medical history significant of for paroxysmal A. fib on Eliquis, COPD on 2 L home oxygen, chronic diastolic CHF, BPH, depression, GERD, gout, peripheral neuropathy, hypertension, hypothyroidism, osteoarthritis, osteoporosis, OSA on CPAP, non-insulin-dependent type 2 diabetes presented to [place] ED after having recurrent overnight falls. Patient states he was told by his wife that he fell 3 times Sunday night and 2 times last night while getting up to go to the bathroom at night.  He does not recall any of these events.  States he takes Ambien for sleep.  He does recall feeling dizzy once during this past week but is no longer doing so.  Denies any chest pain, shortness of breath, or fevers.  Does report having a cough this past week.  Denies nausea, vomiting, abdominal pain, diarrhea, or dysuria.   ED Course: Blood pressure elevated with systolic up to 035, remainder of vital signs stable.  WBC 17.0, hemoglobin 13.3, hematocrit 41.3, platelet 125K.  Sodium 139, potassium 4.2, chloride 102, bicarb 30, BUN 26, creatinine 1.0, glucose 266.  UA not suggestive of infection.  SARS-CoV-2 PCR test negative.  Influenza panel negative.  CT head showing a small volume of acute intraventricular hemorrhage within the right lateral ventricle; no evidence of hydrocephalus.  Also showing new extra-axial CSF density prominence overlying the frontal lobes measuring up to 5 mm raising the possibility of thin subdural hygromas.  CT C-spine negative for acute fracture or traumatic malalignment.  Patient was given Kcentra.  ED physician discussed the case with [place] neurosurgery PA Glenford Peers who recommended admission for observation and repeat head CT in the morning.  Patient was also given IV  labetalol for elevated blood pressure.  Review of Systems:  All systems reviewed and apart from history of presenting illness, are negative.  Past Medical History:  Diagnosis Date  . Atrial fibrillation (HCC)    a. paroxysmal, on Eliquis for anticoagulation  . BPH (benign prostatic hyperplasia)   . Bronchitis   . Cataract   . COPD (chronic obstructive pulmonary disease) (HCC)    2 liters O2 HS  . Depression   . Fatty tumor    waste and back  . Fibromyalgia   . GERD (gastroesophageal reflux disease)   . Glaucoma    bilateral eyes  . Gout   . Hereditary and idiopathic peripheral neuropathy 08/28/2015  . Hypertension   . Hypothyroidism   . Hypoxia   . Insomnia   . Osteoarthritis   . Osteoporosis   . Peripheral neuropathy   . Sleep apnea    uses cpap-add oxygen at night  . Spinal compression fracture (HCC) seventh vertebre  . Transient alteration of awareness 08/28/2015  . Type 2 diabetes mellitus ([place]) 03/08/2016    Past Surgical History:  Procedure Laterality Date  . APPENDECTOMY  age 61  . CARPAL TUNNEL RELEASE Right    early 2000s  . CATARACT EXTRACTION     bilateral  . CHOLECYSTECTOMY  age 32  . EYE SURGERY    . FOOT ARTHRODESIS Right 02/02/2013   Procedure: RIGHT HALLUX METATARSAL PHALANGEAL JOINT ARTHRODESIS ;  Surgeon: Wylene Simmer, MD;  Location: [place];  Service: Orthopedics;  Laterality: Right;  . INGUINAL HERNIA REPAIR  age 84   rt side  . NASAL  CONCHA BULLOSA RESECTION  age 30  . PROSTATE SURGERY    . SHOULDER ARTHROSCOPY W/ ROTATOR CUFF REPAIR Right    early 2000s  . tonsil    . VASECTOMY  age 79     reports that he quit smoking about 44 years ago. His smoking use included cigarettes. He has a 20.00 pack-year smoking history. He has never used smokeless tobacco. He reports current alcohol use. He reports that he does not use drugs.  Allergies  Allergen Reactions  . Other Other (See Comments)    DUST-Other reaction(s): Respiratory  distress    Family History  Problem Relation Age of Onset  . Coronary artery disease Brother   . Heart disease Father   . Lung cancer Father   . Kidney cancer Father   . Prostate cancer Father   . Arthritis Mother   . Lung cancer Mother   . Dementia Mother        Unspecified type, not Alzheimer's disease    Prior to Admission medications   Medication Sig Start Date End Date Taking? Authorizing Provider  acetaminophen (TYLENOL) 500 MG tablet Take 2 tablets (1,000 mg total) by mouth every 8 (eight) hours as needed. Patient taking differently: Take 650 mg by mouth 2 (two) times daily.  04/15/17   Binnie Rail, MD  allopurinol (ZYLOPRIM) 300 MG tablet TAKE 1 TABLET BY MOUTH IN  THE EVENING 06/05/19   Burns, Claudina Lick, MD  atorvastatin (LIPITOR) 20 MG tablet TAKE 1 TABLET BY MOUTH EVERY DAY 01/24/20   Burns, Claudina Lick, MD  bimatoprost (LUMIGAN) 0.01 % SOLN Place 1 drop into both eyes at bedtime.     [provider]  Biotin 5000 MCG TABS Take 5,000 mcg by mouth daily.    [provider]  blood glucose meter kit and supplies KIT Dispense based on patient and insurance preference. Use daily as directed. (FOR E11.9). 02/09/19   Binnie Rail, MD  calcium-vitamin D (OSCAL WITH D) 500-200 MG-UNIT tablet Take 1 tablet by mouth daily with breakfast.    [provider]  colchicine (COLCRYS) 0.6 MG tablet Take 0.6 mg by mouth daily as needed (flareups).     [provider]  diltiazem (CARDIZEM CD) 300 MG 24 hr capsule TAKE 1 CAPSULE BY MOUTH  DAILY Patient taking differently: Take 300 mg by mouth daily.  04/04/19   Lelon Perla, MD  dorzolamide-timolol (COSOPT) 22.3-6.8 MG/ML ophthalmic solution Place 1 drop into the left eye 2 (two) times daily. 07/17/19   [provider]  doxazosin (CARDURA) 8 MG tablet TAKE 1 TABLET BY MOUTH EVERY DAY 11/14/19   Burns, Claudina Lick, MD  DULoxetine (CYMBALTA) 60 MG capsule Take 1 capsule (60 mg total) by mouth 2 (two) times  daily. 01/30/20   Binnie Rail, MD  ELIQUIS 5 MG TABS tablet TAKE 1 TABLET BY MOUTH TWICE A DAY 12/12/19   Lelon Perla, MD  glucose blood test strip Use to check blood sugar daily. E11.9 11/29/18   Binnie Rail, MD  hydrochlorothiazide (HYDRODIURIL) 25 MG tablet Take 1 tablet (25 mg total) by mouth daily. 07/26/19   Lelon Perla, MD  KLOR-CON M20 20 MEQ tablet TAKE 1 TABLET BY MOUTH EVERY DAY 02/29/20   Binnie Rail, MD  Lancets MISC Use to check blood sugars daily. E11.9 11/29/18   Binnie Rail, MD  levothyroxine (SYNTHROID) 112 MCG tablet Take 1 tablet (112 mcg total) by mouth daily before breakfast. 11/14/19  Binnie Rail, MD  magnesium oxide (MAG-OX) 400 MG tablet Take 400 mg by mouth daily.    [provider]  Melatonin 5 MG TABS Take 5 mg by mouth at bedtime.    [provider]  metFORMIN (GLUCOPHAGE-XR) 750 MG 24 hr tablet TAKE 1 TABLET (750 MG TOTAL) BY MOUTH DAILY WITH SUPPER. Patient taking differently: Take 750 mg by mouth in the morning and at bedtime.  08/25/19   Burns, Claudina Lick, MD  mometasone (NASONEX) 50 MCG/ACT nasal spray Place 2 sprays into the nose daily. Patient taking differently: Place 2 sprays into the nose as needed.  02/15/18   Binnie Rail, MD  Multiple Vitamin (MULTIVITAMIN) tablet Take 1 tablet by mouth daily.    [provider]  nitroGLYCERIN (NITROSTAT) 0.4 MG SL tablet Place 1 tablet (0.4 mg total) under the tongue every 5 (five) minutes as needed for chest pain. 12/13/14   Barrett, Evelene Croon, PA-C  omeprazole (PRILOSEC) 20 MG capsule TAKE 1 CAPSULE BY MOUTH  DAILY 03/20/19   Burns, Claudina Lick, MD  oxyCODONE-acetaminophen (PERCOCET) 10-325 MG tablet Take 1 tablet by mouth every 8 (eight) hours as needed for pain.    [provider]  RESTASIS 0.05 % ophthalmic emulsion 1 drop 2 (two) times daily. Patient not taking: Reported on 03/12/2020 09/27/19   [provider]  sildenafil (REVATIO) 20 MG tablet Take 40-100 mg by  mouth daily as needed. 12/01/19   [provider]  solifenacin (VESICARE) 5 MG tablet Take 5 mg by mouth daily. 03/21/19   [provider]  vitamin C (ASCORBIC ACID) 500 MG tablet Take 500 mg by mouth 2 (two) times daily.     [provider]  VITAMIN D, CHOLECALCIFEROL, PO Take 25 mcg by mouth daily.     [provider]  zaleplon (SONATA) 10 MG capsule Take 1 capsule (10 mg total) by mouth at bedtime. Patient not taking: Reported on 03/12/2020 11/21/19   Binnie Rail, MD  zolpidem (AMBIEN) 10 MG tablet TAKE 1 TABLET BY MOUTH EVERY DAY AT BEDTIME AS NEEDED FOR SLEEP 02/28/20   Binnie Rail, MD    Physical Exam: Vitals:   03/14/20 1635 03/14/20 1700 03/14/20 1730 03/14/20 1900  BP: (!) 159/83 (!) 158/88 (!) 170/117 140/80  Pulse: (!) 53 80 (!) 167 96  Resp: 15 18 19 20   Temp:    99.2 F (37.3 C)  TempSrc:    Oral  SpO2: 99% 99% 98% 100%  Weight:    90.6 kg  Height:    5' 7"  (1.702 m)    Physical Exam Constitutional:      General: He is not in acute distress. HENT:     Head: Normocephalic and atraumatic.  Eyes:     Extraocular Movements: Extraocular movements intact.     Pupils: Pupils are equal, round, and reactive to light.  Cardiovascular:     Rate and Rhythm: Normal rate and regular rhythm.     Pulses: Normal pulses.  Pulmonary:     Effort: Pulmonary effort is normal. No respiratory distress.     Breath sounds: Normal breath sounds. No wheezing or rales.  Abdominal:     General: Bowel sounds are normal.     Palpations: Abdomen is soft.     Tenderness: There is no abdominal tenderness. There is no guarding.  Musculoskeletal:        General: No swelling or tenderness.     Cervical back: Normal range of  motion and neck supple.  Skin:    General: Skin is warm and dry.  Neurological:     General: No focal deficit present.     Mental Status: He is alert and oriented to person, place, and time.     Labs on Admission: I have personally  reviewed following labs and imaging studies  CBC: Recent Labs  Lab 03/14/20 1258  WBC 17.0*  HGB 13.3  HCT 41.3  MCV 91.2  PLT 301*   Basic Metabolic Panel: Recent Labs  Lab 03/14/20 1258  NA 139  K 4.2  CL 102  CO2 30  GLUCOSE 266*  BUN 26*  CREATININE 1.04  CALCIUM 10.1   GFR: Estimated Creatinine Clearance: 56.8 mL/min (by C-G formula based on SCr of 1.04 mg/dL). Liver Function Tests: Recent Labs  Lab 03/14/20 1258  AST 24  ALT 21  ALKPHOS 88  BILITOT 0.4  PROT 6.7  ALBUMIN 4.5   No results for input(s): LIPASE, AMYLASE in the last 168 hours. No results for input(s): AMMONIA in the last 168 hours. Coagulation Profile: Recent Labs  Lab 03/14/20 1519  INR 0.9   Cardiac Enzymes: No results for input(s): CKTOTAL, CKMB, CKMBINDEX, TROPONINI in the last 168 hours. BNP (last 3 results) No results for input(s): PROBNP in the last 8760 hours. HbA1C: No results for input(s): HGBA1C in the last 72 hours. CBG: Recent Labs  Lab 03/14/20 1757  GLUCAP 185*   Lipid Profile: No results for input(s): CHOL, HDL, LDLCALC, TRIG, CHOLHDL, LDLDIRECT in the last 72 hours. Thyroid Function Tests: No results for input(s): TSH, T4TOTAL, FREET4, T3FREE, THYROIDAB in the last 72 hours. Anemia Panel: No results for input(s): VITAMINB12, FOLATE, FERRITIN, TIBC, IRON, RETICCTPCT in the last 72 hours. Urine analysis:    Component Value Date/Time   COLORURINE YELLOW 03/14/2020 1258   APPEARANCEUR CLEAR 03/14/2020 1258   LABSPEC 1.015 03/14/2020 1258   PHURINE 8.0 03/14/2020 1258   GLUCOSEU 100 (A) 03/14/2020 1258   GLUCOSEU NEGATIVE 12/22/2018 1543   HGBUR NEGATIVE 03/14/2020 [place] 03/14/2020 1258   BILIRUBINUR neg 11/23/2012 0942   KETONESUR NEGATIVE 03/14/2020 1258   PROTEINUR 100 (A) 03/14/2020 1258   UROBILINOGEN 0.2 12/22/2018 1543   NITRITE NEGATIVE 03/14/2020 1258   LEUKOCYTESUR NEGATIVE 03/14/2020 1258    Radiological Exams on  Admission: CT HEAD WO CONTRAST  Result Date: 03/14/2020 CLINICAL DATA:  Facial trauma. Additional history provided: Fall, head trauma. EXAM: CT HEAD WITHOUT CONTRAST TECHNIQUE: Contiguous axial images were obtained from the base of the skull through the vertex without intravenous contrast. COMPARISON:  Prior head CT examinations 05/08/2019. FINDINGS: Brain: Stable, mild generalized cerebral atrophy. There is small volume acute intraventricular hemorrhage within the right lateral ventricle (for instance as seen on series 2, images 17 and 15). Also new from the prior examination, there is subtle extra-axial CSF density prominence overlying the frontal lobes measuring up to 5 mm raising the possibility of thin subdural hygromas. No demarcated cortical infarct. No extra-axial fluid collection. No evidence of intracranial mass. No midline shift. Vascular: No hyperdense vessel.  Atherosclerotic calcifications. Skull: Normal. Negative for fracture or focal lesion. Sinuses/Orbits: Visualized orbits show no acute finding. Mild ethmoid and sphenoid sinus mucosal thickening. No significant mastoid effusion. Other: Prominent right temporoparietal scalp soft tissue swelling/hematoma. These results were called by telephone at the time of interpretation on 03/14/2020 at 1:07 pm to provider [facility] , who verbally acknowledged these results. IMPRESSION: Small-volume acute intraventricular hemorrhage within the  right lateral ventricle. No evidence of hydrocephalus at this time. New from the prior head CT of 05/08/2019, there is extra-axial CSF density prominence overlying the frontal lobes measuring up to 5 mm, raising the possibility of thin subdural hygromas. Prominent right temporoparietal scalp soft tissue swelling/hematoma. Stable, mild generalized cerebral atrophy. Mild paranasal sinus mucosal thickening. Electronically Signed   By: Kellie Simmering DO   On: 03/14/2020 13:08   CT CERVICAL SPINE WO CONTRAST  Result Date:  03/14/2020 CLINICAL DATA:  Facial trauma.  Multiple falls. EXAM: CT CERVICAL SPINE WITHOUT CONTRAST TECHNIQUE: Multidetector CT imaging of the cervical spine was performed without intravenous contrast. Multiplanar CT image reconstructions were also generated. COMPARISON:  None. FINDINGS: Alignment: Similar straightening of the normal cervical lordosis. Similar minimal (2 mm) of degenerative anterolisthesis of C2 on C3 and C3 on C4. Skull base and vertebrae: No acute fracture. Vertebral body heights are maintained no primary bone lesion or focal pathologic process. Soft tissues and spinal canal: No prevertebral fluid or swelling. No visible canal hematoma. Disc levels: Similar marked multilevel degenerative change including bulky multilevel facet arthropathy. At least moderate bilateral foraminal stenosis on the right at C3-C4. Upper chest: Negative. Other: None. IMPRESSION: 1. No evidence of acute fracture or traumatic malalignment. 2. Similar severe multilevel degenerative change. Electronically Signed   By: Margaretha Sheffield MD   On: 03/14/2020 13:02    EKG: Independently reviewed.  A. fib, rate controlled.  Assessment/Plan Principal Problem:   Intracranial hemorrhage (HCC) Active Problems:   Hypothyroidism   PAF (paroxysmal atrial fibrillation) ([place])   Fall at home, initial encounter   Leukocytosis   Acute intraventricular hemorrhage: Patient presenting after frequent falls.  CT head showing a small volume acute intraventricular hemorrhage within the right lateral ventricle.  No focal neuro deficit at present.  ED provider consulted neurosurgery who recommended admission for observation and repeat head CT in the morning. -Patient is on Eliquis for anticoagulation and was given Kcentra in the ED for reversal.  Continue to hold Eliquis.  Frequent neurochecks, repeat head CT in a.m.  Recurrent falls: Appear to be occurring at night and likely related to Ambien use as patient does not recall any of  these events.  He also takes oxycodone at home for chronic pain. -Check orthostatics, PT/OT, fall precautions.  Discontinue Ambien.  Leukocytosis: Possibly reactive.  WBC count 17.0.  Patient is afebrile.  UA not suggestive of infection.  Does endorse having a cough.   -Order chest x-ray to rule out pneumonia.  Repeat CBC in a.m.  Paroxysmal A. Fib: Currently rate controlled. -Hold Eliquis.  Resume home rate control agent after pharmacy med rec is done.  COPD: Stable.  No signs of acute exacerbation at this time.  No inhalers listed in home medications (pharmacy med rec pending). -DuoNeb as needed  Chronic diastolic CHF: Stable.  Appears euvolemic on exam. -Resume home meds after pharmacy med rec is done.  Hypertension: Blood pressure now improved and systolic around 599. -Order labetalol PRN SBP >170. Resume home meds after pharmacy med rec is done.  Hypothyroidism -Resume home Synthroid after pharmacy med rec is done.  Non-insulin-dependent type 2 diabetes -Check A1c.  Sliding scale insulin sensitive ACHS and CBG checks.  OSA -Continue CPAP at night  DVT prophylaxis: SCDs Code Status: Patient wishes to be DNR. Family Communication: No family available at this time. Disposition Plan: Status is: Observation  The patient remains OBS appropriate and will d/c before 2 midnights.  Dispo: The patient is  from: Home              Anticipated d/c is to: Home              Anticipated d/c date is: 1 day              Patient currently is not medically stable to d/c.  The medical decision making on this patient was of high complexity and the patient is at high risk for clinical deterioration, therefore this is a level 3 visit.  Shela Leff MD [facility]  If 7PM-7AM, please contact night-coverage [email or web address]  03/14/2020, 8:10 PM

## 2020-03-14 NOTE — ED Notes (Signed)
Pt resting comfortably at this time, reading book and using cell phone. No acute distress noted

## 2020-03-14 NOTE — Progress Notes (Signed)
   03/14/20 1900  Vitals  Temp 99.2 F (37.3 C)  Temp Source Oral  BP 140/80  MAP (mmHg) 97  BP Location Left Arm  BP Method Automatic  Patient Position (if appropriate) Sitting  Pulse Rate 96  Pulse Rate Source Monitor  Resp 20  Level of Consciousness  Level of Consciousness Alert  MEWS COLOR  MEWS Score Color Green  Oxygen Therapy  SpO2 100 %  O2 Device Room Air  MEWS Score  MEWS Temp 0  MEWS Systolic 0  MEWS Pulse 0  MEWS RR 0  MEWS LOC 0  MEWS Score 0

## 2020-03-14 NOTE — ED Triage Notes (Signed)
Four nights ago pt fell 3 times during the night, per his wife.  Pt has no recollection of any of it.  Last night pt fell twice during the night and has no recollection of any of it.  Hit left side of head.  Skin is abraded.  Is on Eliquis. Pt sts he voided on himself last night.  Called triage nurse this morning and was sent to ED. Is oriented x4/

## 2020-03-14 NOTE — Progress Notes (Signed)
[facility] to [facility] transfer:   Patient with h/o DM: OSA on CPAP; hypothyroidism; HTN; COPD on home O2 qhs; and afib presenting with recurrent overnight falls, on Eliquis.  Patient with IVH on head CT and needs repeat CT in the AM.  He apparently has vivid dreams overnight and fall frequently overnight.  Patient was d/c neurosurgery PA.  Patient is neurologically intact and reversing with K-Centra.  Recommend overnight observation and repeat CT in AM.  He appears to be stable for Progressive Care bed, observation status.  Neurosurgery will consult upon arrival.  If patient does not get a bed overnight, he can stay there and get repeat CT in AM and they will call back neurosurgery with consideration of d/c to home from [facility] at that time.   Carlyon Shadow, M.D.

## 2020-03-14 NOTE — Consult Note (Signed)
Reason for Consult: Intraventricular hemorrhage, subdural hematoma Referring Physician: Dr. Sedonia Small   HPI: [name] is an 86 year old male with a past medical history that is pertinent for atrial fibrillation on Eliquis and a past subdural hematoma due to a fall, presented to the emergency department today after suffering a fall.  The patient was reported to have had multiple falls but is amnestic to the events.  Tenderness to the left side of his head was noted and appeared to be confused.  Upon arrival to the emergency department, CT imaging of his brain was remarkable for minimal subdural hematoma and interventricular hemorrhage.  The CT findings subsequently led to neurosurgical evaluation.  Per the EDP, the patient is now back at his baseline.  Past Medical History:  Diagnosis Date  . Atrial fibrillation (HCC)    a. paroxysmal, on Eliquis for anticoagulation  . BPH (benign prostatic hyperplasia)   . Bronchitis   . Cataract   . COPD (chronic obstructive pulmonary disease) (HCC)    2 liters O2 HS  . Depression   . Fatty tumor    waste and back  . Fibromyalgia   . GERD (gastroesophageal reflux disease)   . Glaucoma    bilateral eyes  . Gout   . Hereditary and idiopathic peripheral neuropathy 08/28/2015  . Hypertension   . Hypothyroidism   . Hypoxia   . Insomnia   . Osteoarthritis   . Osteoporosis   . Peripheral neuropathy   . Sleep apnea    uses cpap-add oxygen at night  . Spinal compression fracture (HCC) seventh vertebre  . Transient alteration of awareness 08/28/2015  . Type 2 diabetes mellitus ([place]) 03/08/2016    Past Surgical History:  Procedure Laterality Date  . APPENDECTOMY  age 52  . CARPAL TUNNEL RELEASE Right    early 2000s  . CATARACT EXTRACTION     bilateral  . CHOLECYSTECTOMY  age 59  . EYE SURGERY    . FOOT ARTHRODESIS Right 02/02/2013   Procedure: RIGHT HALLUX METATARSAL PHALANGEAL JOINT ARTHRODESIS ;  Surgeon: Wylene Simmer, MD;  Location: [facility];  Service: Orthopedics;  Laterality: Right;  . INGUINAL HERNIA REPAIR  age 84   rt side  . NASAL CONCHA BULLOSA RESECTION  age 37  . PROSTATE SURGERY    . SHOULDER ARTHROSCOPY W/ ROTATOR CUFF REPAIR Right    early 2000s  . tonsil    . VASECTOMY  age 56    Family History  Problem Relation Age of Onset  . Coronary artery disease Brother   . Heart disease Father   . Lung cancer Father   . Kidney cancer Father   . Prostate cancer Father   . Arthritis Mother   . Lung cancer Mother   . Dementia Mother        Unspecified type, not Alzheimer's disease    Social History:  reports that he quit smoking about 44 years ago. His smoking use included cigarettes. He has a 20.00 pack-year smoking history. He has never used smokeless tobacco. He reports current alcohol use. He reports that he does not use drugs.  Allergies:  Allergies  Allergen Reactions  . Other Other (See Comments)    DUST-Other reaction(s): Respiratory distress    Medications: I have reviewed the patient's current medications.  Results for orders placed or performed during the hospital encounter of 03/14/20 (from the past 48 hour(s))  CBC     Status: Abnormal   Collection Time: 03/14/20 12:58 PM  Result Value Ref Range   WBC 17.0 (H) 4.0 - 10.5 K/uL   RBC 4.53 4.22 - 5.81 MIL/uL   Hemoglobin 13.3 13.0 - 17.0 g/dL   HCT 41.3 39 - 52 %   MCV 91.2 80.0 - 100.0 fL   MCH 29.4 26.0 - 34.0 pg   MCHC 32.2 30.0 - 36.0 g/dL   RDW 13.2 11.5 - 15.5 %   Platelets 125 (L) 150 - 400 K/uL   nRBC 0.0 0.0 - 0.2 %    Comment: Performed at [facility], [address] [place], [name] [address]  Comprehensive metabolic panel     Status: Abnormal   Collection Time: 03/14/20 12:58 PM  Result Value Ref Range   Sodium 139 135 - 145 mmol/L   Potassium 4.2 3.5 - 5.1 mmol/L   Chloride 102 98 - 111 mmol/L   CO2 30 22 - 32 mmol/L   Glucose, Bld 266 (H) 70 - 99 mg/dL    Comment: Glucose reference range applies  only to samples taken after fasting for at least 8 hours.   BUN 26 (H) 8 - 23 mg/dL   Creatinine, Ser 1.04 0.61 - 1.24 mg/dL   Calcium 10.1 8.9 - 10.3 mg/dL   Total Protein 6.7 6.5 - 8.1 g/dL   Albumin 4.5 3.5 - 5.0 g/dL   AST 24 15 - 41 U/L   ALT 21 0 - 44 U/L   Alkaline Phosphatase 88 38 - 126 U/L   Total Bilirubin 0.4 0.3 - 1.2 mg/dL   GFR calc non Af Amer >60 >60 mL/min   GFR calc Af Amer >60 >60 mL/min   Anion gap 7 5 - 15    Comment: Performed at [facility], [address], [place],  [address]  Urinalysis, Routine w reflex microscopic Urine, Clean Catch     Status: Abnormal   Collection Time: 03/14/20 12:58 PM  Result Value Ref Range   Color, Urine YELLOW YELLOW   APPearance CLEAR CLEAR   Specific Gravity, Urine 1.015 1.005 - 1.030   pH 8.0 5.0 - 8.0   Glucose, UA 100 (A) NEGATIVE mg/dL   Hgb urine dipstick NEGATIVE NEGATIVE   Bilirubin Urine NEGATIVE NEGATIVE   Ketones, ur NEGATIVE NEGATIVE mg/dL   Protein, ur 100 (A) NEGATIVE mg/dL   Nitrite NEGATIVE NEGATIVE   Leukocytes,Ua NEGATIVE NEGATIVE    Comment: Performed at [facility], [address] [place], [name] [address]  Urinalysis, Microscopic (reflex)     Status: Abnormal   Collection Time: 03/14/20 12:58 PM  Result Value Ref Range   RBC / HPF 0-5 0 - 5 RBC/hpf   WBC, UA 0-5 0 - 5 WBC/hpf   Bacteria, UA FEW (A) NONE SEEN   Squamous Epithelial / LPF NONE SEEN 0 - 5    Comment: Performed at [facility], [address] [place], [name] [address]  Respiratory Panel by RT PCR (Flu A&B, Covid) - Nasopharyngeal Swab     Status: None   Collection Time: 03/14/20  3:19 PM   Specimen: Nasopharyngeal Swab  Result Value Ref Range   SARS Coronavirus 2 by RT PCR NEGATIVE NEGATIVE    Comment: (NOTE) SARS-CoV-2 target nucleic acids are NOT DETECTED.  The SARS-CoV-2 RNA is generally detectable in upper respiratoy specimens during the acute phase of  infection. The lowest concentration of SARS-CoV-2 viral copies this assay can detect is 131  copies/mL. A negative result does not preclude SARS-Cov-2 infection and should not be used as the sole basis for treatment or other patient management decisions. A negative result may occur with  improper specimen collection/handling, submission of specimen other than nasopharyngeal swab, presence of viral mutation(s) within the areas targeted by this assay, and inadequate number of viral copies (<131 copies/mL). A negative result must be combined with clinical observations, patient history, and epidemiological information. The expected result is Negative.  Fact Sheet for Patients:  PinkCheek.be  Fact Sheet for Healthcare Providers:  GravelBags.it  This test is no t yet approved or cleared by the [place] FDA and  has been authorized for detection and/or diagnosis of SARS-CoV-2 by FDA under an Emergency Use Authorization (EUA). This EUA will remain  in effect (meaning this test can be used) for the duration of the COVID-19 declaration under Section 564(b)(1) of the Act, 21 U.S.C. section 360bbb-3(b)(1), unless the authorization is terminated or revoked sooner.     Influenza A by PCR NEGATIVE NEGATIVE   Influenza B by PCR NEGATIVE NEGATIVE    Comment: (NOTE) The Xpert Xpress SARS-CoV-2/FLU/RSV assay is intended as an aid in  the diagnosis of influenza from Nasopharyngeal swab specimens and  should not be used as a sole basis for treatment. Nasal washings and  aspirates are unacceptable for Xpert Xpress SARS-CoV-2/FLU/RSV  testing.  Fact Sheet for Patients: PinkCheek.be  Fact Sheet for Healthcare Providers: GravelBags.it  This test is not yet approved or cleared by the [place] FDA and  has been authorized for detection and/or diagnosis of SARS-CoV-2 by  FDA  under an Emergency Use Authorization (EUA). This EUA will remain  in effect (meaning this test can be used) for the duration of the  Covid-19 declaration under Section 564(b)(1) of the Act, 21  U.S.C. section 360bbb-3(b)(1), unless the authorization is  terminated or revoked. Performed at [facility], [address] [place], [name] [address]   Protime-INR     Status: None   Collection Time: 03/14/20  3:19 PM  Result Value Ref Range   Prothrombin Time 12.2 11.4 - 15.2 seconds   INR 0.9 0.8 - 1.2    Comment: (NOTE) INR goal varies based on device and disease states. Performed at [facility], [address], [place], [name] [address]     CT HEAD WO CONTRAST  Result Date: 03/14/2020 CLINICAL DATA:  Facial trauma. Additional history provided: Fall, head trauma. EXAM: CT HEAD WITHOUT CONTRAST TECHNIQUE: Contiguous axial images were obtained from the base of the skull through the vertex without intravenous contrast. COMPARISON:  Prior head CT examinations 05/08/2019. FINDINGS: Brain: Stable, mild generalized cerebral atrophy. There is small volume acute intraventricular hemorrhage within the right lateral ventricle (for instance as seen on series 2, images 17 and 15). Also new from the prior examination, there is subtle extra-axial CSF density prominence overlying the frontal lobes measuring up to 5 mm raising the possibility of thin subdural hygromas. No demarcated cortical infarct. No extra-axial fluid collection. No evidence of intracranial mass. No midline shift. Vascular: No hyperdense vessel.  Atherosclerotic calcifications. Skull: Normal. Negative for fracture or focal lesion. Sinuses/Orbits: Visualized orbits show no acute finding. Mild ethmoid and sphenoid sinus mucosal thickening. No significant mastoid effusion. Other: Prominent right temporoparietal scalp soft tissue swelling/hematoma. These results were called by telephone at the time of interpretation on  03/14/2020 at 1:07 pm to provider [facility] , who verbally acknowledged these results. IMPRESSION:  Small-volume acute intraventricular hemorrhage within the right lateral ventricle. No evidence of hydrocephalus at this time. New from the prior head CT of 05/08/2019, there is extra-axial CSF density prominence overlying the frontal lobes measuring up to 5 mm, raising the possibility of thin subdural hygromas. Prominent right temporoparietal scalp soft tissue swelling/hematoma. Stable, mild generalized cerebral atrophy. Mild paranasal sinus mucosal thickening. Electronically Signed   By: Kellie Simmering DO   On: 03/14/2020 13:08   CT CERVICAL SPINE WO CONTRAST  Result Date: 03/14/2020 CLINICAL DATA:  Facial trauma.  Multiple falls. EXAM: CT CERVICAL SPINE WITHOUT CONTRAST TECHNIQUE: Multidetector CT imaging of the cervical spine was performed without intravenous contrast. Multiplanar CT image reconstructions were also generated. COMPARISON:  None. FINDINGS: Alignment: Similar straightening of the normal cervical lordosis. Similar minimal (2 mm) of degenerative anterolisthesis of C2 on C3 and C3 on C4. Skull base and vertebrae: No acute fracture. Vertebral body heights are maintained no primary bone lesion or focal pathologic process. Soft tissues and spinal canal: No prevertebral fluid or swelling. No visible canal hematoma. Disc levels: Similar marked multilevel degenerative change including bulky multilevel facet arthropathy. At least moderate bilateral foraminal stenosis on the right at C3-C4. Upper chest: Negative. Other: None. IMPRESSION: 1. No evidence of acute fracture or traumatic malalignment. 2. Similar severe multilevel degenerative change. Electronically Signed   By: Margaretha Sheffield MD   On: 03/14/2020 13:02    Review of Systems  Constitutional: Negative for activity change, chills, fatigue and fever.  Gastrointestinal: Negative for nausea and vomiting.  Neurological: Negative for dizziness,  tremors, seizures, syncope, facial asymmetry, speech difficulty, weakness, light-headedness, numbness and headaches.  Psychiatric/Behavioral: Positive for sleep disturbance. Negative for agitation, behavioral problems, confusion, decreased concentration, dysphoric mood and hallucinations.   Blood pressure (!) 159/83, pulse (!) 53, temperature 98.7 F (37.1 C), temperature source Oral, resp. rate 15, height 5\' 7"  (1.702 m), weight 90.5 kg, SpO2 99 %. Physical Exam Constitutional:      General: He is not in acute distress.    Appearance: Normal appearance.  HENT:     Head: Normocephalic.  Eyes:     Extraocular Movements: Extraocular movements intact.     Pupils: Pupils are equal, round, and reactive to light.  Neurological:     General: No focal deficit present.     Mental Status: He is alert and oriented to person, place, and time. Mental status is at baseline.     Cranial Nerves: No cranial nerve deficit.     Sensory: No sensory deficit.     Motor: No weakness.     Coordination: Coordination normal.     Gait: Gait normal.     Deep Tendon Reflexes: Reflexes normal.  Psychiatric:        Mood and Affect: Mood normal.        Behavior: Behavior normal.        Thought Content: Thought content normal.        Judgment: Judgment normal.     Assessment/Plan: The patient has a very small subdural hematoma and intraventricular hemorrhage that is a result of his fall.  It is unclear whether his fall was syncopal or mechanical in nature.  The patient is on Eliquis for atrial fibrillation which I have recommended be reversed.  The patient will need to be admitted for overnight observation and a repeat head CT done in the morning.  If the patient has any acute neurological decline overnight, CT head should be performed sooner.  Call with  any questions.  Marvis Moeller 03/14/2020, 5:10 PM

## 2020-03-14 NOTE — Progress Notes (Signed)
Pt refused use of BiPAP/CPAP

## 2020-03-14 NOTE — Progress Notes (Signed)
Patient arrived to unit via [facility], pt A&O x4,  pt ambulated to the bathroom and then was oriented to the room. Vitals taken and skin assessed per protocol with second RN. No opened or pressure ulcer noted except bruising to BUE and right posterior head. Telemetry applied and verified with CCMD and second RN called to verify. Admitting hospitalist paged and notified of pt arrival to the unit and needing order. Admission returned call back to notify RN MD will come see pt and put in orders. Fall/safety precaution and prevention education completed with pt, pt voices understanding. Pt sitting up in bed with call light within reach and bed alarm. Will report off to oncoming RN.

## 2020-03-15 ENCOUNTER — Observation Stay (HOSPITAL_COMMUNITY): Payer: Medicare Other

## 2020-03-15 DIAGNOSIS — I629 Nontraumatic intracranial hemorrhage, unspecified: Secondary | ICD-10-CM | POA: Diagnosis not present

## 2020-03-15 DIAGNOSIS — S06360A Traumatic hemorrhage of cerebrum, unspecified, without loss of consciousness, initial encounter: Secondary | ICD-10-CM | POA: Diagnosis not present

## 2020-03-15 LAB — CBC
HCT: 37.3 % — ABNORMAL LOW (ref 39.0–52.0)
Hemoglobin: 12.5 g/dL — ABNORMAL LOW (ref 13.0–17.0)
MCH: 30.4 pg (ref 26.0–34.0)
MCHC: 33.5 g/dL (ref 30.0–36.0)
MCV: 90.8 fL (ref 80.0–100.0)
Platelets: 126 10*3/uL — ABNORMAL LOW (ref 150–400)
RBC: 4.11 MIL/uL — ABNORMAL LOW (ref 4.22–5.81)
RDW: 13.2 % (ref 11.5–15.5)
WBC: 13.3 10*3/uL — ABNORMAL HIGH (ref 4.0–10.5)
nRBC: 0 % (ref 0.0–0.2)

## 2020-03-15 LAB — GLUCOSE, CAPILLARY
Glucose-Capillary: 165 mg/dL — ABNORMAL HIGH (ref 70–99)
Glucose-Capillary: 149 mg/dL — ABNORMAL HIGH (ref 70–99)

## 2020-03-15 MED ORDER — TRAZODONE HCL 50 MG PO TABS: 25.0000 mg | ORAL_TABLET | Freq: Every evening | ORAL | 1 refills | Status: DC | PRN

## 2020-03-15 NOTE — Discharge Summary (Signed)
Physician Discharge Summary  Jonathan [name] NOB:[id number] DOB: [birth year] DOA: 03/14/2020  PCP: Binnie Rail, MD  Admit date: 03/14/2020 Discharge date: 03/15/2020  Admitted From: Home  Disposition:  Home   Recommendations for Outpatient Follow-up:  1. Follow up with PCP Dr. Quay Burow in 1 week 2. Dr. Quay Burow: Please address Ambien use and falls, also please restart Eliquis when falls have stopped 3. Dr. Quay Burow: Please assess efficacy of new trazodone for sleep 4. Patient to follow up with Dr. Kathrin Penner today for ophthalmological evaluation of floaters 5. Follow up with [facility] in 2-3 weeks for repeat CT head      Home Health: None  Equipment/Devices: None  Discharge Condition: Good  CODE STATUS: DNR Diet recommendation: Cardiac  Brief/Interim Summary: [name] is a 86 y.o. M with hx pAF on Eliquis, chronic pain on oxycodone and Ambien, previous SDH, COPD on home O2 noct, HTN, hypothyroidism, OSA on CPAP, and DM who presented after several falls.  Evidently, patient fell 3 times earlier this week at night, then on the night of admission fell twice more.  In the morning, noticed an abrasion on his head and had headache, so called PCP's office who told him to go to the ER.  In the ER, CT head showed a small acute intraventricular hemorrhage within the right lateral ventricle.  Given KCentra and admitted for overnight observation.          PRINCIPAL HOSPITAL DIAGNOSIS: Fall and intraventricular hemorrhage due to Ambien    Discharge Diagnoses:   Intraventricular hemorrhage due to fall Patient neurologically intact.  CT head showed intraventricular hemorrhage.  Patient observed overnight and neurological status was stable and normal during observation period.  Repeat CT head unchanged. Evaluated by Neurosurgery who recommended office follow up in 2 weeks and resumption of Eliquis if felt necessary by PCP.     Paroxysmal atrial fibrillation Patient had  previously stopped Eliquis due to SDH.  During that holiday, he had several TIAs he reports.  This pattern probably militates in favor of restarting Eliquis even after this event.  The best way to mitigate risk of life-threatening bleeding would be to avoid obvious risks for falls like Ambien, oxycodone, alcohol.  Patient counseled thusly.  Insomnia Stop Ambien and Sonata. Given new script for trazodone.  Floaters Patient noted new black large floater in right eye after fall.  Dicsussed with Dr. Elder Negus office, they will evaluate him today.   Leukocytosis Likely reactive, no signs of infection, CXR reviewed, normal.  Hypothyroidism  Fibromyalgia  COPD No active disease  OSA  Hypertension  Diabetes  Chronic diastolic CHF             Discharge Instructions  Discharge Instructions    Discharge instructions   Complete by: As directed    From Dr. Loleta Books: You were admitted for a fall and injury to the head causing a small intraventricular hemorrhage.  This is similar to a "bruise in the brain"  Your Eliquis bloodthinner was reversed and you should STOP this medicine for now Go see Dr. Quay Burow in 7-10 days and discuss with her your sleep, any further falls, and when to restart Eliquis  Go see [facility] in 2-3 weeks Call their office number below, explain that you were seen in the hospital by their team Weston Brass) and were told to follow up in 2-3 weeks   For now, STOP zolpidem/Ambien and avoid zaleplon/Sonata also  For sleep: Try melatonin first Doses of melatonin between 1 mg and  10 mg are common, experiment with different doses, starting low and increasing until you find a dose that works  Attempt to use a cognitive-behavioral approach for sleep One good option would be: [employer] Look up [employer]   Lastly, try the sleep aid trazodone  Start low and go slow Try 25 mg (1/2 tab) at night before sleeping If you have no side effects,  you may increase to 50 mg nightly to see if this is effective   Go see Dr. Kathrin Penner about the floater     Allergies as of 03/15/2020      Reactions   Other Other (See Comments)   DUST-Other reaction(s): Respiratory distress      Medication List    STOP taking these medications   Eliquis 5 MG Tabs tablet Generic drug: apixaban   zaleplon 10 MG capsule Commonly known as: SONATA   zolpidem 10 MG tablet Commonly known as: AMBIEN     TAKE these medications   acetaminophen 500 MG tablet Commonly known as: TYLENOL Take 2 tablets (1,000 mg total) by mouth every 8 (eight) hours as needed. What changed:   how much to take  when to take this   allopurinol 300 MG tablet Commonly known as: ZYLOPRIM TAKE 1 TABLET BY MOUTH IN  THE EVENING   atorvastatin 20 MG tablet Commonly known as: LIPITOR TAKE 1 TABLET BY MOUTH EVERY DAY   Biotin 5000 MCG Tabs Take 5,000 mcg by mouth daily.   blood glucose meter kit and supplies Kit Dispense based on patient and insurance preference. Use daily as directed. (FOR E11.9).   calcium-vitamin D 500-200 MG-UNIT tablet Commonly known as: OSCAL WITH D Take 1 tablet by mouth daily with breakfast.   Colcrys 0.6 MG tablet Generic drug: colchicine Take 0.6 mg by mouth daily as needed (flareups).   diltiazem 300 MG 24 hr capsule Commonly known as: CARDIZEM CD TAKE 1 CAPSULE BY MOUTH  DAILY   dorzolamide-timolol 22.3-6.8 MG/ML ophthalmic solution Commonly known as: COSOPT Place 1 drop into the left eye 2 (two) times daily.   doxazosin 8 MG tablet Commonly known as: CARDURA TAKE 1 TABLET BY MOUTH EVERY DAY What changed:   how much to take  how to take this  when to take this  additional instructions   DULoxetine 60 MG capsule Commonly known as: Cymbalta Take 1 capsule (60 mg total) by mouth 2 (two) times daily.   glucose blood test strip Use to check blood sugar daily. E11.9   hydrochlorothiazide 25 MG tablet Commonly  known as: HYDRODIURIL Take 1 tablet (25 mg total) by mouth daily.   Klor-Con M20 20 MEQ tablet Generic drug: potassium chloride SA TAKE 1 TABLET BY MOUTH EVERY DAY What changed: how much to take   Lancets Misc Use to check blood sugars daily. E11.9   levothyroxine 112 MCG tablet Commonly known as: SYNTHROID Take 1 tablet (112 mcg total) by mouth daily before breakfast.   Lumigan 0.01 % Soln Generic drug: bimatoprost Place 1 drop into both eyes at bedtime.   magnesium oxide 400 MG tablet Commonly known as: MAG-OX Take 400 mg by mouth daily.   melatonin 5 MG Tabs Take 5 mg by mouth at bedtime.   metFORMIN 750 MG 24 hr tablet Commonly known as: GLUCOPHAGE-XR TAKE 1 TABLET (750 MG TOTAL) BY MOUTH DAILY WITH SUPPER. What changed: when to take this   mometasone 50 MCG/ACT nasal spray Commonly known as: NASONEX Place 2 sprays into the nose daily. What  changed:   when to take this  reasons to take this   multivitamin tablet Take 1 tablet by mouth daily.   nitroGLYCERIN 0.4 MG SL tablet Commonly known as: NITROSTAT Place 1 tablet (0.4 mg total) under the tongue every 5 (five) minutes as needed for chest pain.   omeprazole 20 MG capsule Commonly known as: PRILOSEC TAKE 1 CAPSULE BY MOUTH  DAILY   Restasis 0.05 % ophthalmic emulsion Generic drug: cycloSPORINE Place 1 drop into both eyes 2 (two) times daily.   sildenafil 20 MG tablet Commonly known as: REVATIO Take 40-100 mg by mouth daily as needed.   solifenacin 5 MG tablet Commonly known as: VESICARE Take 5 mg by mouth daily.   vitamin C 500 MG tablet Commonly known as: ASCORBIC ACID Take 500 mg by mouth 2 (two) times daily.   VITAMIN D (CHOLECALCIFEROL) PO Take 25 mcg by mouth daily.       Allergies  Allergen Reactions  . Other Other (See Comments)    DUST-Other reaction(s): Respiratory distress    Consultations:  Neurosurgery   Procedures/Studies: CT Head Wo Contrast  Result Date:  03/15/2020 CLINICAL DATA:  Follow-up intracranial hemorrhage. Fall with head injury. EXAM: CT HEAD WITHOUT CONTRAST TECHNIQUE: Contiguous axial images were obtained from the base of the skull through the vertex without intravenous contrast. COMPARISON:  CT head 03/13/2020 FINDINGS: Brain: Small volume intraventricular hemorrhage is unchanged. No new hemorrhage. No hydrocephalus. Generalized atrophy. Bifrontal subdural fluid collections have CSF density and appears slightly larger in the interval. Hygromas measure approximately 4 mm in diameter bilaterally. No acute infarct or mass. Vascular: Negative for hyperdense vessel Skull: Negative Sinuses/Orbits: Paranasal sinuses clear. Bilateral cataract extraction. No orbital lesion. Other: None IMPRESSION: Small volume intraventricular hemorrhage in the right lateral ventricle unchanged. No new hemorrhage. No hydrocephalus. Small bilateral subdural hygromas in the frontal lobes bilaterally appear slightly larger compared with yesterday. These were not present on the prior CT of 05/08/2019. Electronically Signed   By: Franchot Gallo M.D.   On: 03/15/2020 09:23   CT HEAD WO CONTRAST  Result Date: 03/14/2020 CLINICAL DATA:  Facial trauma. Additional history provided: Fall, head trauma. EXAM: CT HEAD WITHOUT CONTRAST TECHNIQUE: Contiguous axial images were obtained from the base of the skull through the vertex without intravenous contrast. COMPARISON:  Prior head CT examinations 05/08/2019. FINDINGS: Brain: Stable, mild generalized cerebral atrophy. There is small volume acute intraventricular hemorrhage within the right lateral ventricle (for instance as seen on series 2, images 17 and 15). Also new from the prior examination, there is subtle extra-axial CSF density prominence overlying the frontal lobes measuring up to 5 mm raising the possibility of thin subdural hygromas. No demarcated cortical infarct. No extra-axial fluid collection. No evidence of intracranial  mass. No midline shift. Vascular: No hyperdense vessel.  Atherosclerotic calcifications. Skull: Normal. Negative for fracture or focal lesion. Sinuses/Orbits: Visualized orbits show no acute finding. Mild ethmoid and sphenoid sinus mucosal thickening. No significant mastoid effusion. Other: Prominent right temporoparietal scalp soft tissue swelling/hematoma. These results were called by telephone at the time of interpretation on 03/14/2020 at 1:07 pm to provider [facility] , who verbally acknowledged these results. IMPRESSION: Small-volume acute intraventricular hemorrhage within the right lateral ventricle. No evidence of hydrocephalus at this time. New from the prior head CT of 05/08/2019, there is extra-axial CSF density prominence overlying the frontal lobes measuring up to 5 mm, raising the possibility of thin subdural hygromas. Prominent right temporoparietal scalp soft tissue swelling/hematoma. Stable, mild  generalized cerebral atrophy. Mild paranasal sinus mucosal thickening. Electronically Signed   By: Kellie Simmering DO   On: 03/14/2020 13:08   CT CERVICAL SPINE WO CONTRAST  Result Date: 03/14/2020 CLINICAL DATA:  Facial trauma.  Multiple falls. EXAM: CT CERVICAL SPINE WITHOUT CONTRAST TECHNIQUE: Multidetector CT imaging of the cervical spine was performed without intravenous contrast. Multiplanar CT image reconstructions were also generated. COMPARISON:  None. FINDINGS: Alignment: Similar straightening of the normal cervical lordosis. Similar minimal (2 mm) of degenerative anterolisthesis of C2 on C3 and C3 on C4. Skull base and vertebrae: No acute fracture. Vertebral body heights are maintained no primary bone lesion or focal pathologic process. Soft tissues and spinal canal: No prevertebral fluid or swelling. No visible canal hematoma. Disc levels: Similar marked multilevel degenerative change including bulky multilevel facet arthropathy. At least moderate bilateral foraminal stenosis on the right at  C3-C4. Upper chest: Negative. Other: None. IMPRESSION: 1. No evidence of acute fracture or traumatic malalignment. 2. Similar severe multilevel degenerative change. Electronically Signed   By: Margaretha Sheffield MD   On: 03/14/2020 13:02   DG CHEST PORT 1 VIEW  Result Date: 03/14/2020 CLINICAL DATA:  Cough. EXAM: PORTABLE CHEST 1 VIEW COMPARISON:  05/09/2019 FINDINGS: The cardiomediastinal silhouette is unchanged with normal heart size. Aortic atherosclerosis is noted. The lungs are well inflated with interval resolution of hazy left lung opacities on the prior study. There is scarring in both lung bases. No acute airspace consolidation, edema, pleural effusion, pneumothorax is identified. A suture anchor is noted in the right humeral head. IMPRESSION: No active disease. Electronically Signed   By: Logan Bores M.D.   On: 03/14/2020 21:20       Subjective: Patient feeling well.  No headache, no focal neurological deficits.  Interactive and pleasant.  Large floater in right eye without "curtain" or showers of floaters.  Discharge Exam: Vitals:   03/15/20 0746 03/15/20 1204  BP: (!) 161/88 (!) 168/80  Pulse: 70 65  Resp: 20 18  Temp: 98.4 F (36.9 C) 98.7 F (37.1 C)  SpO2:  99%   Vitals:   03/14/20 2218 03/15/20 0335 03/15/20 0746 03/15/20 1204  BP: (!) 155/65 138/71 (!) 161/88 (!) 168/80  Pulse: 67 64 70 65  Resp: _0 Temp: 98.6 F (37 C) 98.5 F (36.9 C) 98.4 F (36.9 C) 98.7 F (37.1 C)  TempSrc: Oral Oral  Oral  SpO2: 100% 100%  99%  Weight:      Height:        General: Pt is alert, awake, not in acute distress Cardiovascular: RRR, nl S1-S2, no murmurs appreciated.   No LE edema.   Respiratory: Normal respiratory rate and rhythm.  CTAB without rales or wheezes. Abdominal: Abdomen soft and non-tender.  No distension or HSM.   Neuro/Psych: Strength symmetric in upper and lower extremities.  Judgment and insight appear normal.   The results of significant  diagnostics from this hospitalization (including imaging, microbiology, ancillary and laboratory) are listed below for reference.     Microbiology: Recent Results (from the past 240 hour(s))  Respiratory Panel by RT PCR (Flu A&B, Covid) - Nasopharyngeal Swab     Status: None   Collection Time: 03/14/20  3:19 PM   Specimen: Nasopharyngeal Swab  Result Value Ref Range Status   SARS Coronavirus 2 by RT PCR NEGATIVE NEGATIVE Final    Comment: (NOTE) SARS-CoV-2 target nucleic acids are NOT DETECTED.  The SARS-CoV-2 RNA is generally detectable in upper  respiratoy specimens during the acute phase of infection. The lowest concentration of SARS-CoV-2 viral copies this assay can detect is 131 copies/mL. A negative result does not preclude SARS-Cov-2 infection and should not be used as the sole basis for treatment or other patient management decisions. A negative result may occur with  improper specimen collection/handling, submission of specimen other than nasopharyngeal swab, presence of viral mutation(s) within the areas targeted by this assay, and inadequate number of viral copies (<131 copies/mL). A negative result must be combined with clinical observations, patient history, and epidemiological information. The expected result is Negative.  Fact Sheet for Patients:  PinkCheek.be  Fact Sheet for Healthcare Providers:  GravelBags.it  This test is no t yet approved or cleared by the [place] FDA and  has been authorized for detection and/or diagnosis of SARS-CoV-2 by FDA under an Emergency Use Authorization (EUA). This EUA will remain  in effect (meaning this test can be used) for the duration of the COVID-19 declaration under Section 564(b)(1) of the Act, 21 U.S.C. section 360bbb-3(b)(1), unless the authorization is terminated or revoked sooner.     Influenza A by PCR NEGATIVE NEGATIVE Final   Influenza B by PCR NEGATIVE  NEGATIVE Final    Comment: (NOTE) The Xpert Xpress SARS-CoV-2/FLU/RSV assay is intended as an aid in  the diagnosis of influenza from Nasopharyngeal swab specimens and  should not be used as a sole basis for treatment. Nasal washings and  aspirates are unacceptable for Xpert Xpress SARS-CoV-2/FLU/RSV  testing.  Fact Sheet for Patients: PinkCheek.be  Fact Sheet for Healthcare Providers: GravelBags.it  This test is not yet approved or cleared by the [place] FDA and  has been authorized for detection and/or diagnosis of SARS-CoV-2 by  FDA under an Emergency Use Authorization (EUA). This EUA will remain  in effect (meaning this test can be used) for the duration of the  Covid-19 declaration under Section 564(b)(1) of the Act, 21  U.S.C. section 360bbb-3(b)(1), unless the authorization is  terminated or revoked. Performed at [facility], [address] [place], [name] [address]      Labs: BNP (last 3 results) Recent Labs    05/08/19 0324  BNP 02.7   Basic Metabolic Panel: Recent Labs  Lab 03/14/20 1258  NA 139  K 4.2  CL 102  CO2 30  GLUCOSE 266*  BUN 26*  CREATININE 1.04  CALCIUM 10.1   Liver Function Tests: Recent Labs  Lab 03/14/20 1258  AST 24  ALT 21  ALKPHOS 88  BILITOT 0.4  PROT 6.7  ALBUMIN 4.5   No results for input(s): LIPASE, AMYLASE in the last 168 hours. No results for input(s): AMMONIA in the last 168 hours. CBC: Recent Labs  Lab 03/14/20 1258 03/15/20 0219  WBC 17.0* 13.3*  HGB 13.3 12.5*  HCT 41.3 37.3*  MCV 91.2 90.8  PLT 125* 126*   Cardiac Enzymes: No results for input(s): CKTOTAL, CKMB, CKMBINDEX, TROPONINI in the last 168 hours. BNP: Invalid input(s): POCBNP CBG: Recent Labs  Lab 03/14/20 1757 03/14/20 2139 03/15/20 0604 03/15/20 1211  GLUCAP 185* 231* 165* 149*   D-Dimer No results for input(s): DDIMER in the last 72 hours. Hgb A1c Recent  Labs    03/14/20 2145  HGBA1C 8.1*   Lipid Profile No results for input(s): CHOL, HDL, LDLCALC, TRIG, CHOLHDL, LDLDIRECT in the last 72 hours. Thyroid function studies No results for input(s): TSH, T4TOTAL, T3FREE, THYROIDAB in the last 72 hours.  Invalid input(s):  FREET3 Anemia work up No results for input(s): VITAMINB12, FOLATE, FERRITIN, TIBC, IRON, RETICCTPCT in the last 72 hours. Urinalysis    Component Value Date/Time   COLORURINE YELLOW 03/14/2020 1258   APPEARANCEUR CLEAR 03/14/2020 1258   LABSPEC 1.015 03/14/2020 1258   PHURINE 8.0 03/14/2020 1258   GLUCOSEU 100 (A) 03/14/2020 1258   GLUCOSEU NEGATIVE 12/22/2018 1543   HGBUR NEGATIVE 03/14/2020 1258   [place] 03/14/2020 1258   BILIRUBINUR neg 11/23/2012 0942   KETONESUR NEGATIVE 03/14/2020 1258   PROTEINUR 100 (A) 03/14/2020 1258   UROBILINOGEN 0.2 12/22/2018 1543   NITRITE NEGATIVE 03/14/2020 1258   LEUKOCYTESUR NEGATIVE 03/14/2020 1258   Sepsis Labs Invalid input(s): PROCALCITONIN,  WBC,  LACTICIDVEN Microbiology Recent Results (from the past 240 hour(s))  Respiratory Panel by RT PCR (Flu A&B, Covid) - Nasopharyngeal Swab     Status: None   Collection Time: 03/14/20  3:19 PM   Specimen: Nasopharyngeal Swab  Result Value Ref Range Status   SARS Coronavirus 2 by RT PCR NEGATIVE NEGATIVE Final    Comment: (NOTE) SARS-CoV-2 target nucleic acids are NOT DETECTED.  The SARS-CoV-2 RNA is generally detectable in upper respiratoy specimens during the acute phase of infection. The lowest concentration of SARS-CoV-2 viral copies this assay can detect is 131 copies/mL. A negative result does not preclude SARS-Cov-2 infection and should not be used as the sole basis for treatment or other patient management decisions. A negative result may occur with  improper specimen collection/handling, submission of specimen other than nasopharyngeal swab, presence of viral mutation(s) within the areas targeted by  this assay, and inadequate number of viral copies (<131 copies/mL). A negative result must be combined with clinical observations, patient history, and epidemiological information. The expected result is Negative.  Fact Sheet for Patients:  PinkCheek.be  Fact Sheet for Healthcare Providers:  GravelBags.it  This test is no t yet approved or cleared by the [place] FDA and  has been authorized for detection and/or diagnosis of SARS-CoV-2 by FDA under an Emergency Use Authorization (EUA). This EUA will remain  in effect (meaning this test can be used) for the duration of the COVID-19 declaration under Section 564(b)(1) of the Act, 21 U.S.C. section 360bbb-3(b)(1), unless the authorization is terminated or revoked sooner.     Influenza A by PCR NEGATIVE NEGATIVE Final   Influenza B by PCR NEGATIVE NEGATIVE Final    Comment: (NOTE) The Xpert Xpress SARS-CoV-2/FLU/RSV assay is intended as an aid in  the diagnosis of influenza from Nasopharyngeal swab specimens and  should not be used as a sole basis for treatment. Nasal washings and  aspirates are unacceptable for Xpert Xpress SARS-CoV-2/FLU/RSV  testing.  Fact Sheet for Patients: PinkCheek.be  Fact Sheet for Healthcare Providers: GravelBags.it  This test is not yet approved or cleared by the [place] FDA and  has been authorized for detection and/or diagnosis of SARS-CoV-2 by  FDA under an Emergency Use Authorization (EUA). This EUA will remain  in effect (meaning this test can be used) for the duration of the  Covid-19 declaration under Section 564(b)(1) of the Act, 21  U.S.C. section 360bbb-3(b)(1), unless the authorization is  terminated or revoked. Performed at [facility], [address] [place], [place] [address]      Time coordinating discharge: 35  minutes      SIGNED:   Edwin Dada, MD  [facility] 03/15/2020, 12:40 PM

## 2020-03-15 NOTE — TOC CAGE-AID Note (Signed)
Transition of Care [facility]) - CAGE-AID Screening   Patient Details  Name: Jonathan [name] MRN: [id number] Date of Birth: [birth year]  Transition of Care [facility]) CM/SW Contact:    Emeterio Reeve, [place] Phone Number: 03/15/2020, 1:59 PM   Clinical Narrative:  CSW called into pts room to complete assessment. Pt did not answer.   CAGE-AID Screening: Substance Abuse Screening unable to be completed due to: : Patient unable to participate                  [place] Clinical Social Worker [phone]

## 2020-03-15 NOTE — Progress Notes (Signed)
Discharged to home after IV access removed and discharge instructions reviewed with.

## 2020-03-15 NOTE — TOC Transition Note (Signed)
Transition of Care [facility]) - CM/SW Discharge Note   Patient Details  Name: Jonathan [name] MRN: [id number] Date of Birth: [birth year]  Transition of [facility]) CM/SW Contact:  Pollie Friar, RN Phone Number: 03/15/2020, 12:36 PM   Clinical Narrative:    Pt is discharging home with self care. No f/u per PT.  Pt has transportation home.    Final next level of care: Home/Self Care Barriers to Discharge: No Barriers Identified   Patient Goals and CMS Choice        Discharge Placement                       Discharge Plan and Services                                     Social Determinants of Health (SDOH) Interventions     Readmission Risk Interventions No flowsheet data found.

## 2020-03-15 NOTE — Progress Notes (Signed)
Physical Therapy Evaluation Patient Details Name: Jonathan [name] MRN: [id number] DOB: [birth year] Today's Date: 03/15/2020   History of Present Illness  Patient is an 86 year old male who has recently been falling at night, with no recollection of the events. As a result of a fall, he hit the L side of his head on 03/14/20. CT images revealed intraventricular hemmorhage and subdural hematoma. Medical hx of the following: A fib, BPH, COPD, fibromyalgia, fatty tumor, gout, neuropathy, HTN, hypothyroidism, hypoxia, OA, OP, spinal compression fx, DM2, and sleep apnea.  Clinical Impression  Patient appears steady with gait and is able to change head positions and speeds appropriately without LOB or change in gait. He does display some short term memory deficits, but he reports this has been occurring prior to his recent falls and subsequent injury. He does decrease his speed and require 2 UE support when descending stairs on occasion, but has no LOB when negotiating ~20 steps. He also has a stair lift available at home for safety. Secondary to the patient appearing to be at his baseline and that he demonstrates steadiness and safety with functional mobility, no further PT services are needed at this time.     Follow Up Recommendations No PT follow up;Supervision for mobility/OOB    Equipment Recommendations  None recommended by PT    Recommendations for Other Services       Precautions / Restrictions Precautions Precautions: Fall Restrictions Weight Bearing Restrictions: No      Mobility  Bed Mobility Overal bed mobility: Independent             General bed mobility comments: Pt able to roll and transition sidelying > sit R EOB with bed flat and no use of bed rails, cuing pt to roll then sit up.  Transfers Overall transfer level: Independent Equipment used: None             General transfer comment: Able to come to rise with steadiness and appropriate amount of time, no  LOB.  Ambulation/Gait Ambulation/Gait assistance: Independent Gait Distance (Feet): 250 Feet Assistive device: None Gait Pattern/deviations: WFL(Within Functional Limits)   Gait velocity interpretation: >4.37 ft/sec, indicative of normal walking speed    Stairs Stairs: Yes   Stair Management: Two rails;One rail Right;Alternating pattern;Step to pattern Number of Stairs: 20 General stair comments: Ascending with reciprocal pattern and use of R hand rail, no LOB or unsteadiness. Descending with decreased speed, alternating reciprocal and step-to pattern and 1-2 hand rails utilized, with min guard for safety.  Wheelchair Mobility    Modified Rankin (Stroke Patients Only)       Balance Overall balance assessment: Independent                               Standardized Balance Assessment Standardized Balance Assessment :  (Able to turn and nod head and inc and dec speed without LOB )           Pertinent Vitals/Pain Pain Assessment: Faces Faces Pain Scale: Hurts a little bit Pain Location: reports pain all over due to arthritis, but denies head pain Pain Descriptors / Indicators: Discomfort Pain Intervention(s): Monitored during session    Home Living Family/patient expects to be discharged to:: Private residence Living Arrangements: Spouse/significant other Available Help at Discharge: Family;Available 24 hours/day (pt assists wife with chores and cooking ) Type of Home: House Home Access: Stairs to enter Entrance Stairs-Rails: Right;Left;Can reach both [employer] of  Steps: 3-4 Home Layout: Two level Home Equipment: Grab bars - tub/shower;Hand held shower head;Walker - 2 wheels;Shower seat - built in;Other (comment);Wheelchair - manual Additional Comments: stair lift    Prior Function Level of Independence: Independent         Comments: Drives, does IADLs. Gets groceries delivered. Helps care for wife.     Hand Dominance   Dominant  Hand: Right    Extremity/Trunk Assessment   Upper Extremity Assessment Upper Extremity Assessment: Overall WFL for tasks assessed    Lower Extremity Assessment Lower Extremity Assessment: Overall WFL for tasks assessed       Communication   Communication: No difficulties  Cognition Arousal/Alertness: Awake/alert Behavior During Therapy: WFL for tasks assessed/performed Overall Cognitive Status: Within Functional Limits for tasks assessed                                 General Comments: Pt reports difficulty with memory that was occurring prior to falls and current injury. Able to recall 2/3 with 1 hint during short-term memory assessment.      General Comments      Exercises     Assessment/Plan    PT Assessment Patent does not need any further PT services  PT Problem List         PT Treatment Interventions      PT Goals (Current goals can be found in the Care Plan section)  Acute Rehab PT Goals Patient Stated Goal: to go home PT Goal Formulation: With patient Time For Goal Achievement: 03/18/20 Potential to Achieve Goals: Good    Frequency     Barriers to discharge        Co-evaluation               AM-PAC PT "6 Clicks" Mobility  Outcome Measure Help needed turning from your back to your side while in a flat bed without using bedrails?: None Help needed moving from lying on your back to sitting on the side of a flat bed without using bedrails?: None Help needed moving to and from a bed to a chair (including a wheelchair)?: None Help needed standing up from a chair using your arms (e.g., wheelchair or bedside chair)?: None Help needed to walk in hospital room?: None Help needed climbing 3-5 steps with a railing? : None 6 Click Score: 24    End of Session Equipment Utilized During Treatment: Gait belt Activity Tolerance: Patient tolerated treatment well (HR 90-100 throughout) Patient left: in chair;with call bell/phone within  reach;with chair alarm set   PT Visit Diagnosis: Repeated falls (R29.6);History of falling (Z91.81)    Time: 1129-1150 PT Time Calculation (min) (ACUTE ONLY): 21 min   Charges:   PT Evaluation $PT Eval Low Complexity: 1 Low          Moishe Spice, PT, DPT Acute Rehabilitation Services  Pager: [phone] Office: [place] 03/15/2020, 1:05 PM

## 2020-03-15 NOTE — Progress Notes (Signed)
OT Cancellation Note  Patient Details Name: Jonathan [name] MRN: [id number] DOB: [birth year]   Cancelled Treatment:    Reason Eval/Treat Not Completed: Patient at procedure or test/ unavailable. Pt off unit for test/procedure. Plan to reattempt.   Tyrone Schimke, OT Acute Rehabilitation Services Pager: [phone] Office: [phone]  03/15/2020, 9:02 AM

## 2020-03-15 NOTE — Progress Notes (Signed)
Subjective: Patient reports that he feels as though he is doing well, at his baseline, and is eager to be discharged today. The patient and RN reportedno acute events overnight. Follow up CT head has yet to be performed at the time of my assessment this morning.   Objective: Vital signs in last 24 hours: Temp:  [98.4 F (36.9 C)-99.2 F (37.3 C)] 98.4 F (36.9 C) (10/01 0746) Pulse Rate:  [53-167] 70 (10/01 0746) Resp:  [14-20] 20 (10/01 0746) BP: (135-170)/(65-117) 161/88 (10/01 0746) SpO2:  [98 %-100 %] 100 % (10/01 0335) FiO2 (%):  [100 %] 100 % (09/30 2007) Weight:  [90.5 kg-90.6 kg] 90.6 kg (09/30 1900)  Intake/Output from previous day: 09/30 0701 - 10/01 0700 In: 240.3 [P.O.:240; I.V.:0.3] Out: -  Intake/Output this shift: No intake/output data recorded.  Physical Exam Constitutional:      General: He is not in acute distress.    Appearance: Normal appearance.  HENT:     Head: Normocephalic.  Eyes:     Extraocular Movements: Extraocular movements intact.     Pupils: Pupils are equal, round, and reactive to light.  Neurological:     General: No focal deficit present.     Mental Status: He is alert and oriented to person, place, and time. Mental status is at baseline.     Cranial Nerves: No cranial nerve deficit.     Sensory: No sensory deficit.     Motor: No weakness.     Coordination: Coordination normal.     Gait: Gait normal.     Deep Tendon Reflexes: Reflexes normal.  Psychiatric:        Mood and Affect: Mood normal.        Behavior: Behavior normal.        Thought Content: Thought content normal.        Judgment: Judgment normal.   Lab Results: Recent Labs    03/14/20 1258 03/15/20 0219  WBC 17.0* 13.3*  HGB 13.3 12.5*  HCT 41.3 37.3*  PLT 125* 126*   BMET Recent Labs    03/14/20 1258  NA 139  K 4.2  CL 102  CO2 30  GLUCOSE 266*  BUN 26*  CREATININE 1.04  CALCIUM 10.1    Studies/Results: CT HEAD WO CONTRAST  Result Date:  03/14/2020 CLINICAL DATA:  Facial trauma. Additional history provided: Fall, head trauma. EXAM: CT HEAD WITHOUT CONTRAST TECHNIQUE: Contiguous axial images were obtained from the base of the skull through the vertex without intravenous contrast. COMPARISON:  Prior head CT examinations 05/08/2019. FINDINGS: Brain: Stable, mild generalized cerebral atrophy. There is small volume acute intraventricular hemorrhage within the right lateral ventricle (for instance as seen on series 2, images 17 and 15). Also new from the prior examination, there is subtle extra-axial CSF density prominence overlying the frontal lobes measuring up to 5 mm raising the possibility of thin subdural hygromas. No demarcated cortical infarct. No extra-axial fluid collection. No evidence of intracranial mass. No midline shift. Vascular: No hyperdense vessel.  Atherosclerotic calcifications. Skull: Normal. Negative for fracture or focal lesion. Sinuses/Orbits: Visualized orbits show no acute finding. Mild ethmoid and sphenoid sinus mucosal thickening. No significant mastoid effusion. Other: Prominent right temporoparietal scalp soft tissue swelling/hematoma. These results were called by telephone at the time of interpretation on 03/14/2020 at 1:07 pm to provider [facility] , who verbally acknowledged these results. IMPRESSION: Small-volume acute intraventricular hemorrhage within the right lateral ventricle. No evidence of hydrocephalus at this time. New from the prior  head CT of 05/08/2019, there is extra-axial CSF density prominence overlying the frontal lobes measuring up to 5 mm, raising the possibility of thin subdural hygromas. Prominent right temporoparietal scalp soft tissue swelling/hematoma. Stable, mild generalized cerebral atrophy. Mild paranasal sinus mucosal thickening. Electronically Signed   By: Kellie Simmering DO   On: 03/14/2020 13:08   CT CERVICAL SPINE WO CONTRAST  Result Date: 03/14/2020 CLINICAL DATA:  Facial trauma.   Multiple falls. EXAM: CT CERVICAL SPINE WITHOUT CONTRAST TECHNIQUE: Multidetector CT imaging of the cervical spine was performed without intravenous contrast. Multiplanar CT image reconstructions were also generated. COMPARISON:  None. FINDINGS: Alignment: Similar straightening of the normal cervical lordosis. Similar minimal (2 mm) of degenerative anterolisthesis of C2 on C3 and C3 on C4. Skull base and vertebrae: No acute fracture. Vertebral body heights are maintained no primary bone lesion or focal pathologic process. Soft tissues and spinal canal: No prevertebral fluid or swelling. No visible canal hematoma. Disc levels: Similar marked multilevel degenerative change including bulky multilevel facet arthropathy. At least moderate bilateral foraminal stenosis on the right at C3-C4. Upper chest: Negative. Other: None. IMPRESSION: 1. No evidence of acute fracture or traumatic malalignment. 2. Similar severe multilevel degenerative change. Electronically Signed   By: Margaretha Sheffield MD   On: 03/14/2020 13:02   DG CHEST PORT 1 VIEW  Result Date: 03/14/2020 CLINICAL DATA:  Cough. EXAM: PORTABLE CHEST 1 VIEW COMPARISON:  05/09/2019 FINDINGS: The cardiomediastinal silhouette is unchanged with normal heart size. Aortic atherosclerosis is noted. The lungs are well inflated with interval resolution of hazy left lung opacities on the prior study. There is scarring in both lung bases. No acute airspace consolidation, edema, pleural effusion, pneumothorax is identified. A suture anchor is noted in the right humeral head. IMPRESSION: No active disease. Electronically Signed   By: Logan Bores M.D.   On: 03/14/2020 21:20    Assessment/Plan: The patient appears to be doing very well and had no acute events overnight. We are awaiting the results from his follow up CT head. He is stable from a neurosurgical perspective and as long as his repeat imaging is stable, he can be discharged. The patient is on Eliquis for atrial  fibrillation which was reversed yesterday.  I have counseled the patient to stop his Eliquis for now and follow up with his PCP to discuss potential risks and benefits of restarting. During my discussion with the patient, it was revealed that the patient is taking Ambien 10 mg HS and he believes that thismedication is making him be amnestic at night and could be leading to his falls. I discussed with the patient about ceasing use of Ambien and discussing other sleep aid options including sleep hygiene and less sedating medications. He should follow up with [place] in the clinic in about 2 weeks with a new CT head. Call with any questions.    LOS: 0 days     Marvis Moeller 03/15/2020, 8:47 AM

## 2020-03-15 NOTE — Progress Notes (Signed)
   03/15/20 1215  OT Visit Information  Last OT Received On 03/15/20  Assistance Needed +1  Reason Eval/Treat Not Completed OT screened, no needs identified, will sign off  History of Present Illness Patient is an 86 year old male who has recently been falling at night, with no recollection of the events. As a result of a fall, he hit the L side of his head on 03/14/20. CT images revealed intraventricular hemmorhage and subdural hematoma. Medical hx of the following: A fib, BPH, COPD, fibromyalgia, fatty tumor, gout, neuropathy, HTN, hypothyroidism, hypoxia, OA, OP, spinal compression fx, DM2, and sleep apnea.   Per chart review and conversations with PT and patient, pt appears to be back to baseline with ADLs. Will screen out.   Tyrone Schimke, OT Acute Rehabilitation Services Pager: [phone] Office: [phone]

## 2020-03-18 DIAGNOSIS — H18593 Other hereditary corneal dystrophies, bilateral: Secondary | ICD-10-CM | POA: Diagnosis not present

## 2020-03-18 DIAGNOSIS — H04123 Dry eye syndrome of bilateral lacrimal glands: Secondary | ICD-10-CM | POA: Diagnosis not present

## 2020-03-18 DIAGNOSIS — H43811 Vitreous degeneration, right eye: Secondary | ICD-10-CM | POA: Diagnosis not present

## 2020-03-19 ENCOUNTER — Telehealth: Payer: Self-pay | Admitting: Student

## 2020-03-19 DIAGNOSIS — I480 Paroxysmal atrial fibrillation: Secondary | ICD-10-CM

## 2020-03-19 MED ORDER — DILTIAZEM HCL 30 MG PO TABS: 30.0000 mg | ORAL_TABLET | Freq: Two times a day (BID) | ORAL | 2 refills | Status: DC | PRN

## 2020-03-19 NOTE — Telephone Encounter (Signed)
   Patient called answering service with concerns about heart rate. Called and spoke with patient. Patient reports checking his BP/heart this afternoon and noted elevated heart rate.   Here are his home readings: - 4:42pm: BP 135/95 HR 118. - 5:00pm: BP 134/97 HR 114. - Approximate 5:55pm (while I was on the phone with him): BP 134/86 HR 129.  Patient does have a history of paroxysmal atrial fibrillation. He is on Cardizem CD 300mg  daily which he took today. He states he can usually tell when he is in atrial fibrillation because he gets very weak. He is currently asymptomatic. No palpitations, chest pain, lightheadedness, dizziness etc. He states his heart rates are normally in the 50's to 60's and were in this range yesterday when he checked his vitals. It sounds like he may be in atrial fibrillation with RVR (he was in rate controlled atrial fibrillation on recent EKG on 03/14/2020). Given he has borderline bradycardia normally, I don't want to increase his Cardizem too much. He has also already taken his long-acting Cardizem today. Therefore, will prescribe short acting Cardizem 30mg  for patient to take as needed for sustained heart rates >100 bpm. Given patient is asymptomatic, I do not think he needs to come to the ED right now. Of note, patient recently taken off Eliquis due to SDH so we would not be able to cardiovert him anyway if he really is in atrial fibrillation.   Also recommended office visit within the next couple of days for EKG and further evaluation. Will route message to scheduling pool to help arrange. Advised that if patient becomes symptomatic (chest pain, shortness of breath, significant palpitations, lightheaded/dizzy, etc), he should go to the ED for further evaluation. Patient voiced understanding and agreed.  Darreld Mclean, PA-C 03/19/2020 6:19 PM

## 2020-03-21 ENCOUNTER — Encounter: Payer: Self-pay | Admitting: Cardiology

## 2020-03-21 ENCOUNTER — Other Ambulatory Visit: Payer: Self-pay | Admitting: Neurosurgery

## 2020-03-21 ENCOUNTER — Other Ambulatory Visit: Payer: Self-pay

## 2020-03-21 ENCOUNTER — Ambulatory Visit (INDEPENDENT_AMBULATORY_CARE_PROVIDER_SITE_OTHER): Payer: Medicare Other | Admitting: Cardiology

## 2020-03-21 VITALS — BP 126/70 | HR 62 | Ht 67.0 in | Wt 198.4 lb

## 2020-03-21 DIAGNOSIS — E1165 Type 2 diabetes mellitus with hyperglycemia: Secondary | ICD-10-CM | POA: Diagnosis not present

## 2020-03-21 DIAGNOSIS — S065XAA Traumatic subdural hemorrhage with loss of consciousness status unknown, initial encounter: Secondary | ICD-10-CM

## 2020-03-21 DIAGNOSIS — I482 Chronic atrial fibrillation, unspecified: Secondary | ICD-10-CM

## 2020-03-21 DIAGNOSIS — S065X9A Traumatic subdural hemorrhage with loss of consciousness of unspecified duration, initial encounter: Secondary | ICD-10-CM | POA: Diagnosis not present

## 2020-03-21 DIAGNOSIS — W19XXXD Unspecified fall, subsequent encounter: Secondary | ICD-10-CM | POA: Diagnosis not present

## 2020-03-21 DIAGNOSIS — G894 Chronic pain syndrome: Secondary | ICD-10-CM

## 2020-03-21 DIAGNOSIS — G4733 Obstructive sleep apnea (adult) (pediatric): Secondary | ICD-10-CM | POA: Diagnosis not present

## 2020-03-21 DIAGNOSIS — Y92009 Unspecified place in unspecified non-institutional (private) residence as the place of occurrence of the external cause: Secondary | ICD-10-CM | POA: Diagnosis not present

## 2020-03-21 DIAGNOSIS — Z7901 Long term (current) use of anticoagulants: Secondary | ICD-10-CM

## 2020-03-21 DIAGNOSIS — J438 Other emphysema: Secondary | ICD-10-CM

## 2020-03-21 HISTORY — DX: Traumatic subdural hemorrhage with loss of consciousness status unknown, initial encounter: S06.5XAA

## 2020-03-21 HISTORY — DX: Traumatic subdural hemorrhage with loss of consciousness of unspecified duration, initial encounter: S06.5X9A

## 2020-03-21 NOTE — Assessment & Plan Note (Signed)
Recurrent SDH secondary to falls at home- Sept 2019 and again 03/14/2020- Eliquis stopped- ? Resume at some point.  The patient tells me he had TIAs a few months after his Eliquis was stopped in 2019 and it was resumed.

## 2020-03-21 NOTE — Assessment & Plan Note (Signed)
On Glucophage 

## 2020-03-21 NOTE — Patient Instructions (Signed)
Medication Instructions:  Continue current medications  *If you need a refill on your cardiac medications before your next appointment, please call your pharmacy*   Lab Work: None Ordered   Testing/Procedures: None Ordered   Follow-Up: At [facility], you and your health needs are our priority.  As part of our continuing mission to provide you with exceptional heart care, we have created designated Provider Care Teams.  These Care Teams include your primary Cardiologist (physician) and Advanced Practice Providers (APPs -  Physician Assistants and Nurse Practitioners) who all work together to provide you with the care you need, when you need it.  We recommend signing up for the patient portal called "MyChart".  Sign up information is provided on this After Visit Summary.  MyChart is used to connect with patients for Virtual Visits (Telemedicine).  Patients are able to view lab/test results, encounter notes, upcoming appointments, etc.  Non-urgent messages can be sent to your provider as well.   To learn more about what you can do with MyChart, go to [email or web address].    Your next appointment:   3 month(s)  The format for your next appointment:   In Person  Provider:   You may see Brian Crenshaw, MD or one of the following Advanced Practice Providers on your designated Care Team:    Luke Kilroy, PA-C  Callie Goodrich, PA-C  Jesse Cleaver, FNP     

## 2020-03-21 NOTE — Assessment & Plan Note (Signed)
Admitted 03/14/2020 after multiple falls at home.  The patient attributes this to [place] use

## 2020-03-21 NOTE — Assessment & Plan Note (Signed)
Rate control with Diltiazem

## 2020-03-21 NOTE — Progress Notes (Signed)
Cardiology Office Note:    Date:  03/21/2020   ID:  Jonathan [name], DOB 08/11/1932, MRN [id number]  PCP:  Binnie Rail, MD  Cardiologist:  Kirk Ruths, MD  Electrophysiologist:  None   Referring MD: Binnie Rail, MD   No chief complaint on file. Post hospital follow up  History of Present Illness:    Jonathan [name] is a 86 y.o. male with a hx of a history of PAF which now appears to be chronic, hypertension, sleep apnea, non-insulin-dependent diabetes, and recurrent falls.  Patient had a fall in September 2019.  Suffered a subdural hematoma.  Eliquis was stopped at that time but the patient tells me it was resumed a couple months later after he had a TIA.  He was admitted to the hospital 03/14/2020 after he had multiple falls at home.  He again had a subdural hematoma.  Eliquis was reversed in the emergency room.  Follow-up CT scan prior to discharge showed that his subdural hematoma was stable.  He has a follow-up with Dr. Trenton Gammon next week.  He has a follow-up with his PCP next week as well.  From a cardiac standpoint he is stable.  He did have an episode of rapid heart rate at home, he was given a prescription for diltiazem 30 mg twice daily as needed but he has not used this yet.  I suspect he is not on a beta-blocker because of his COPD although there is no documentation of this in the chart.  He is also noted to be somewhat hypertensive, blood pressure by me was 150/62.  He is not on an ARB or ACE inhibitor, again there is no clear documentation as to why in the chart.  Past Medical History:  Diagnosis Date  . Atrial fibrillation (HCC)    a. paroxysmal, on Eliquis for anticoagulation  . BPH (benign prostatic hyperplasia)   . Bronchitis   . Cataract   . COPD (chronic obstructive pulmonary disease) (HCC)    2 liters O2 HS  . Depression   . Fatty tumor    waste and back  . Fibromyalgia   . GERD (gastroesophageal reflux disease)   . Glaucoma    bilateral eyes  . Gout   .  Hereditary and idiopathic peripheral neuropathy 08/28/2015  . Hypertension   . Hypothyroidism   . Hypoxia   . Insomnia   . Osteoarthritis   . Osteoporosis   . Peripheral neuropathy   . Sleep apnea    uses cpap-add oxygen at night  . Spinal compression fracture (HCC) seventh vertebre  . Transient alteration of awareness 08/28/2015  . Type 2 diabetes mellitus ([place]) 03/08/2016    Past Surgical History:  Procedure Laterality Date  . APPENDECTOMY  age 61  . CARPAL TUNNEL RELEASE Right    early 2000s  . CATARACT EXTRACTION     bilateral  . CHOLECYSTECTOMY  age 85  . EYE SURGERY    . FOOT ARTHRODESIS Right 02/02/2013   Procedure: RIGHT HALLUX METATARSAL PHALANGEAL JOINT ARTHRODESIS ;  Surgeon: Wylene Simmer, MD;  Location: [facility];  Service: Orthopedics;  Laterality: Right;  . INGUINAL HERNIA REPAIR  age 76   rt side  . NASAL CONCHA BULLOSA RESECTION  age 79  . PROSTATE SURGERY    . SHOULDER ARTHROSCOPY W/ ROTATOR CUFF REPAIR Right    early 2000s  . tonsil    . VASECTOMY  age 31    Current Medications: Current Meds  Medication Sig  .  acetaminophen (TYLENOL) 500 MG tablet Take 2 tablets (1,000 mg total) by mouth every 8 (eight) hours as needed. (Patient taking differently: Take 650 mg by mouth 2 (two) times daily. )  . acetaminophen (TYLENOL) 650 MG CR tablet Take 650 mg by mouth in the morning and at bedtime. Pt takes 2 tabs 2 times daily  . allopurinol (ZYLOPRIM) 300 MG tablet TAKE 1 TABLET BY MOUTH IN  THE EVENING (Patient taking differently: Take 300 mg by mouth every evening. )  . atorvastatin (LIPITOR) 20 MG tablet TAKE 1 TABLET BY MOUTH EVERY DAY (Patient taking differently: Take 20 mg by mouth daily. )  . bimatoprost (LUMIGAN) 0.01 % SOLN Place 1 drop into both eyes at bedtime.   . Biotin 5000 MCG TABS Take 5,000 mcg by mouth daily.  . blood glucose meter kit and supplies KIT Dispense based on patient and insurance preference. Use daily as directed. (FOR  E11.9).  . calcium-vitamin D (OSCAL WITH D) 500-200 MG-UNIT tablet Take 1 tablet by mouth daily with breakfast.  . colchicine (COLCRYS) 0.6 MG tablet Take 0.6 mg by mouth daily as needed (flareups).   . diltiazem (CARDIZEM CD) 300 MG 24 hr capsule TAKE 1 CAPSULE BY MOUTH  DAILY (Patient taking differently: Take 300 mg by mouth daily. )  . diltiazem (CARDIZEM) 30 MG tablet Take 1 tablet (30 mg total) by mouth 2 (two) times daily as needed (sustained elevated heart rates >100 bpm for > 20 minutes.).  Marland Kitchen dorzolamide-timolol (COSOPT) 22.3-6.8 MG/ML ophthalmic solution Place 1 drop into the left eye 2 (two) times daily.  Marland Kitchen doxazosin (CARDURA) 8 MG tablet TAKE 1 TABLET BY MOUTH EVERY DAY (Patient taking differently: Take 8 mg by mouth daily. )  . DULoxetine (CYMBALTA) 60 MG capsule Take 1 capsule (60 mg total) by mouth 2 (two) times daily.  Marland Kitchen glucose blood test strip Use to check blood sugar daily. E11.9  . hydrochlorothiazide (HYDRODIURIL) 25 MG tablet Take 1 tablet (25 mg total) by mouth daily.  Marland Kitchen KLOR-CON M20 20 MEQ tablet TAKE 1 TABLET BY MOUTH EVERY DAY (Patient taking differently: Take 20 mEq by mouth daily. )  . Lancets MISC Use to check blood sugars daily. E11.9  . levothyroxine (SYNTHROID) 112 MCG tablet Take 1 tablet (112 mcg total) by mouth daily before breakfast.  . magnesium oxide (MAG-OX) 400 MG tablet Take 400 mg by mouth daily.  . Melatonin 5 MG TABS Take 5 mg by mouth at bedtime.  . metFORMIN (GLUCOPHAGE-XR) 750 MG 24 hr tablet TAKE 1 TABLET (750 MG TOTAL) BY MOUTH DAILY WITH SUPPER. (Patient taking differently: Take 750 mg by mouth in the morning and at bedtime. )  . mometasone (NASONEX) 50 MCG/ACT nasal spray Place 2 sprays into the nose daily. (Patient taking differently: Place 2 sprays into the nose as needed (allergies). )  . Multiple Vitamin (MULTIVITAMIN) tablet Take 1 tablet by mouth daily.  . nitroGLYCERIN (NITROSTAT) 0.4 MG SL tablet Place 1 tablet (0.4 mg total) under the  tongue every 5 (five) minutes as needed for chest pain.  Marland Kitchen omeprazole (PRILOSEC) 20 MG capsule TAKE 1 CAPSULE BY MOUTH  DAILY (Patient taking differently: Take 20 mg by mouth daily. )  . RESTASIS 0.05 % ophthalmic emulsion Place 1 drop into both eyes 2 (two) times daily.   . sildenafil (REVATIO) 20 MG tablet Take 40-100 mg by mouth daily as needed.  . solifenacin (VESICARE) 5 MG tablet Take 5 mg by mouth daily.  . traZODone (DESYREL)  50 MG tablet Take 0.5-1 tablets (25-50 mg total) by mouth at bedtime as needed for sleep.  . vitamin C (ASCORBIC ACID) 500 MG tablet Take 500 mg by mouth 2 (two) times daily.   Marland Kitchen VITAMIN D, CHOLECALCIFEROL, PO Take 25 mcg by mouth daily.      Allergies:   Other   Social History   Socioeconomic History  . Marital status: Married    Spouse name: Not on file  . Number of children: 2  . Years of education: 69  . Highest education level: Master's degree (e.g., MA, MS, MEng, MEd, MSW, MBA)  Occupational History  . Occupation: RETIRED    Employer: RETIRED  Tobacco Use  . Smoking status: Former Smoker    Packs/day: 2.00    Years: 10.00    Pack years: 20.00    Types: Cigarettes    Quit date: 07/18/1975    Years since quitting: 44.7  . Smokeless tobacco: Never Used  Vaping Use  . Vaping Use: Never used  Substance and Sexual Activity  . Alcohol use: Yes    Comment: 1-2 drinks/day  . Drug use: No  . Sexual activity: Not Currently  Other Topics Concern  . Not on file  Social History Narrative  . Not on file   Social Determinants of Health   Financial Resource Strain: Medium Risk  . Difficulty of Paying Living Expenses: Somewhat hard  Food Insecurity:   . Worried About Charity fundraiser in the Last Year: Not on file  . Ran Out of Food in the Last Year: Not on file  Transportation Needs:   . Lack of Transportation (Medical): Not on file  . Lack of Transportation (Non-Medical): Not on file  Physical Activity:   . Days of Exercise per Week: Not on  file  . Minutes of Exercise per Session: Not on file  Stress:   . Feeling of Stress : Not on file  Social Connections:   . Frequency of Communication with Friends and Family: Not on file  . Frequency of Social Gatherings with Friends and Family: Not on file  . Attends Religious Services: Not on file  . Active Member of Clubs or Organizations: Not on file  . Attends Archivist Meetings: Not on file  . Marital Status: Not on file     Family History: The patient's family history includes Arthritis in his mother; Coronary artery disease in his brother; Dementia in his mother; Heart disease in his father; Kidney cancer in his father; Lung cancer in his father and mother; Prostate cancer in his father.  ROS:   Please see the history of present illness.     All other systems reviewed and are negative.  EKGs/Labs/Other Studies Reviewed:    The following studies were reviewed today: Echo 02/08/2017- Study Conclusions   - Left ventricle: The cavity size was normal. Systolic function was  normal. The estimated ejection fraction was in the range of 60%  to 65%. Wall motion was normal; there were no regional wall  motion abnormalities. Features are consistent with a pseudonormal  left ventricular filling pattern, with concomitant abnormal  relaxation and increased filling pressure (grade 2 diastolic  dysfunction).  - Left atrium: The atrium was mildly dilated.  - Atrial septum: There was increased thickness of the septum,  consistent with lipomatous hypertrophy.  - Tricuspid valve: There was trivial regurgitation.  - Pulmonary arteries: Systolic pressure could not be accurately  estimated.   Myoview 12/27/2014-  Nuclear stress  EF: 56%.  The left ventricular ejection fraction is normal (55-65%).  The study is normal.  This is a low risk study.   EKG:  EKG is not ordered today.  The ekg ordered 03/14/2020 demonstrates AF with VR 72  Recent  Labs: 05/08/2019: B Natriuretic Peptide 90.8 05/10/2019: Magnesium 1.8 08/14/2019: TSH 3.13 03/14/2020: ALT 21; BUN 26; Creatinine, Ser 1.04; Potassium 4.2; Sodium 139 03/15/2020: Hemoglobin 12.5; Platelets 126  Recent Lipid Panel    Component Value Date/Time   CHOL 111 08/14/2019 1028   TRIG 211.0 (H) 08/14/2019 1028   HDL 37.60 (L) 08/14/2019 1028   CHOLHDL 3 08/14/2019 1028   VLDL 42.2 (H) 08/14/2019 1028   LDLCALC 66 11/11/2018 1114   LDLDIRECT 51.0 08/14/2019 1028    Physical Exam:    VS:  BP 126/70   Pulse 62   Ht 5' 7"  (1.702 m)   Wt 198 lb 6.4 oz (90 kg)   SpO2 97%   BMI 31.07 kg/m     Wt Readings from Last 3 Encounters:  03/21/20 198 lb 6.4 oz (90 kg)  03/14/20 199 lb 11.8 oz (90.6 kg)  01/30/20 198 lb (89.8 kg)     GEN: Overweight caucasian male, in no acute distress HEENT: Normal NECK: No JVD; No carotid bruits CARDIAC: irregularly irregular, no murmurs, rubs, gallops RESPIRATORY:  Clear to auscultation without rales, wheezing or rhonchi  ABDOMEN: truncal obesity, soft, non-tender, non-distended MUSCULOSKELETAL:  No edema; No deformity  SKIN: Warm and dry NEUROLOGIC:  Alert and oriented x 3 PSYCHIATRIC:  Normal affect   ASSESSMENT:    SDH (subdural hematoma) (HCC) Recurrent SDH secondary to falls at home- Sept 2019 and again 03/14/2020- Eliquis stopped- ? Resume at some point.  The patient tells me he had TIAs a few months after his Eliquis was stopped in 2019 and it was resumed.   Fall at home, subsequent encounter Admitted 03/14/2020 after multiple falls at home.  The patient attributes this to Ambian use  Chronic atrial fibrillation ([place]) Rate control with Diltiazem  Type 2 diabetes mellitus ([place]) On Glucophage  PLAN:    CHADs2 VASc score is 6.  I'll discuss recommendation about long term anticoagulation with Dr Stanford Breed.    I have asked the patient to continue and monitor his B/P and f/u with Dr Quay Burow as scheduled next week-  ? Add ARB for  HTN.   Medication Adjustments/Labs and Tests Ordered: Current medicines are reviewed at length with the patient today.  Concerns regarding medicines are outlined above.  No orders of the defined types were placed in this encounter.  No orders of the defined types were placed in this encounter.   Patient Instructions  Medication Instructions:  Continue current medications  *If you need a refill on your cardiac medications before your next appointment, please call your pharmacy*   Lab Work: None Ordered   Testing/Procedures: None Ordered   Follow-Up: At [facility], you and your health needs are our priority.  As part of our continuing mission to provide you with exceptional heart care, we have created designated Provider Care Teams.  These Care Teams include your primary Cardiologist (physician) and Advanced Practice Providers (APPs -  Physician Assistants and Nurse Practitioners) who all work together to provide you with the care you need, when you need it.  We recommend signing up for the patient portal called "MyChart".  Sign up information is provided on this After Visit Summary.  MyChart is used to connect with patients for  Virtual Visits (Telemedicine).  Patients are able to view lab/test results, encounter notes, upcoming appointments, etc.  Non-urgent messages can be sent to your provider as well.   To learn more about what you can do with MyChart, go to [email or web address].    Your next appointment:   3 month(s)  The format for your next appointment:   In Person  Provider:   You may see Kirk Ruths, MD or one of the following Advanced Practice Providers on your designated Care Team:    Kerin Ransom, PA-C  [place], [place]  Coletta Memos, FNP        Signed, Kerin Ransom, [place]  03/21/2020 11:57 AM    [place]

## 2020-03-24 NOTE — Progress Notes (Signed)
Subjective:    Patient ID: Jonathan [name], male    DOB: Oct 01, 1932, 86 y.o.   MRN: [id number]  HPI The patient is here for follow up from the hospital.  His wife is with him.   Admitted 9/30-10/1 after recurrent overnight falls. Within the past few days he fell 5 times at night per his wife.  He did not recall any of this, likely due from his [place] he takes at night.  He did recall feeling dizzy once in the past week.  He denied CP, SOB, fever, GI symptoms and dysuria.     SBP was 170.  Wbc 17,  Flu and covid neg.  Ct of head small volume of acute intraventricular hemorrhage w/in right lateral ventricle, no hydrocephalus, also possible thin subdural hygromas in frontal loabs.  Ct c-spine neg for acute abnormalities.   He was given [place].  Neurosurgery advised observation and Ct in the am, which was stable.  He received IV labetalol for elevated BP. Eliquis was held.  There was no neuro deficits.    ambien d/c'd and placed on trazodone.  Chronic medical problems were stable and no changes were made.    He is scheduled for another Ct scan on the 18th.  He is off the blood thinner for now.    BP has been higher at home  but is getting better.     Only taking tylenol for pain.  He has not taken the trazodone. Sleeping ok w/o medication.  Doing some deep breathing and relaxation exercises.   The other day he did feel anxious and had a panic attack.    nocturia 3-5 / night   Energy medium - low  No falls since being home.  Balance is not an issue per him.   He does admit to concern over falling and his balance is not great.      Medications and allergies reviewed with patient and updated if appropriate.  Patient Active Problem List   Diagnosis Date Noted  . SDH (subdural hematoma) ([place]) 03/21/2020  . Intracranial hemorrhage ([place]) 03/14/2020  . Fall at home, subsequent encounter 03/14/2020  . Leukocytosis 03/14/2020  . Nausea 09/13/2019  . Rotator cuff arthropathy of left  shoulder 07/21/2019  . Dupuytren's disease of palm 07/12/2019  . Sebaceous cyst 06/27/2019  . COVID-19 05/08/2019  . Acute encephalopathy 05/08/2019  . Obstructive sleep apnea   . Asymmetrical left sensorineural hearing loss 07/12/2018  . Acute cystitis without hematuria 05/18/2018  . Chronic anticoagulation 12/21/2017  . Compression fracture of thoracic vertebra ([place]) 09/21/2017  . Degeneration of thoracic intervertebral disc 09/21/2017  . Thoracic back pain 08/10/2017  . Degenerative joint disease of hand 07/14/2017  . Osteoarthritis   . Hypoxia   . Fibromyalgia   . Fatty tumor   . Cataract   . BPH (benign prostatic hyperplasia)   . Chronic atrial fibrillation ([place])   . Insomnia 03/10/2017  . Chronic pain, legs and back 03/10/2017  . Hyperlipidemia 03/10/2017  . Obesity (BMI 30.0-34.9) 12/08/2016  . Depression 09/07/2016  . Spondylolisthesis 09/07/2016  . Hypothyroidism 03/08/2016  . GERD (gastroesophageal reflux disease) 03/08/2016  . Gout 03/08/2016  . Glaucoma 03/08/2016  . Type 2 diabetes mellitus ([place]) 03/08/2016  . Hereditary and idiopathic peripheral neuropathy 08/28/2015  . Essential hypertension 12/04/2011  . Allergic rhinitis, seasonal 12/04/2011  . COPD (chronic obstructive pulmonary disease) ([place]) 07/18/2011    Current Outpatient Medications on File Prior to Visit  Medication Sig Dispense Refill  .  acetaminophen (TYLENOL) 650 MG CR tablet Take 650 mg by mouth in the morning and at bedtime. Pt takes 2 tabs 2 times daily    . allopurinol (ZYLOPRIM) 300 MG tablet TAKE 1 TABLET BY MOUTH IN  THE EVENING (Patient taking differently: Take 300 mg by mouth every evening. ) 90 tablet 3  . atorvastatin (LIPITOR) 20 MG tablet TAKE 1 TABLET BY MOUTH EVERY DAY (Patient taking differently: Take 20 mg by mouth daily. ) 90 tablet 1  . bimatoprost (LUMIGAN) 0.01 % SOLN Place 1 drop into both eyes at bedtime.     . Biotin 5000 MCG TABS Take 5,000 mcg by mouth daily.    . blood  glucose meter kit and supplies KIT Dispense based on patient and insurance preference. Use daily as directed. (FOR E11.9). 1 each 0  . calcium-vitamin D (OSCAL WITH D) 500-200 MG-UNIT tablet Take 1 tablet by mouth daily with breakfast.    . Capsaicin (CAPSAICIN ARTHRITIS RELIEF) 0.15 % LIQD Apply topically.    . Capsaicin 0.1 % CREA Apply topically.    . colchicine (COLCRYS) 0.6 MG tablet Take 0.6 mg by mouth daily as needed (flareups).     . diltiazem (CARDIZEM CD) 300 MG 24 hr capsule TAKE 1 CAPSULE BY MOUTH  DAILY (Patient taking differently: Take 300 mg by mouth daily. ) 90 capsule 3  . diltiazem (CARDIZEM) 30 MG tablet Take 1 tablet (30 mg total) by mouth 2 (two) times daily as needed (sustained elevated heart rates >100 bpm for > 20 minutes.). 30 tablet 2  . doxazosin (CARDURA) 8 MG tablet TAKE 1 TABLET BY MOUTH EVERY DAY (Patient taking differently: Take 8 mg by mouth daily. ) 90 tablet 1  . DULoxetine (CYMBALTA) 60 MG capsule Take 1 capsule (60 mg total) by mouth 2 (two) times daily. 180 capsule 1  . glucose blood test strip Use to check blood sugar daily. E11.9 100 each 3  . hydrochlorothiazide (HYDRODIURIL) 25 MG tablet Take 1 tablet (25 mg total) by mouth daily. 90 tablet 1  . KLOR-CON M20 20 MEQ tablet TAKE 1 TABLET BY MOUTH EVERY DAY (Patient taking differently: Take 20 mEq by mouth daily. ) 90 tablet 1  . Lancets MISC Use to check blood sugars daily. E11.9 100 each 3  . levothyroxine (SYNTHROID) 112 MCG tablet Take 1 tablet (112 mcg total) by mouth daily before breakfast. 90 tablet 1  . magnesium oxide (MAG-OX) 400 MG tablet Take 400 mg by mouth daily.    . Melatonin 5 MG TABS Take 5 mg by mouth at bedtime.    . metFORMIN (GLUCOPHAGE-XR) 750 MG 24 hr tablet TAKE 1 TABLET (750 MG TOTAL) BY MOUTH DAILY WITH SUPPER. (Patient taking differently: Take 750 mg by mouth in the morning and at bedtime. ) 90 tablet 1  . mometasone (NASONEX) 50 MCG/ACT nasal spray Place 2 sprays into the nose  daily. (Patient taking differently: Place 2 sprays into the nose as needed (allergies). ) 17 g 3  . Multiple Vitamin (MULTIVITAMIN) tablet Take 1 tablet by mouth daily.    . nitroGLYCERIN (NITROSTAT) 0.4 MG SL tablet Place 1 tablet (0.4 mg total) under the tongue every 5 (five) minutes as needed for chest pain. 90 tablet 3  . omeprazole (PRILOSEC) 20 MG capsule TAKE 1 CAPSULE BY MOUTH  DAILY (Patient taking differently: Take 20 mg by mouth daily. ) 90 capsule 3  . RESTASIS 0.05 % ophthalmic emulsion Place 1 drop into both eyes 2 (two) times  daily.     . sildenafil (REVATIO) 20 MG tablet Take 40-100 mg by mouth daily as needed.    . solifenacin (VESICARE) 5 MG tablet Take 5 mg by mouth daily.    . vitamin C (ASCORBIC ACID) 500 MG tablet Take 500 mg by mouth 2 (two) times daily.     Marland Kitchen VITAMIN D, CHOLECALCIFEROL, PO Take 25 mcg by mouth daily.     . traZODone (DESYREL) 50 MG tablet Take 0.5-1 tablets (25-50 mg total) by mouth at bedtime as needed for sleep. (Patient not taking: Reported on 03/25/2020) 30 tablet 1   No current facility-administered medications on file prior to visit.    Past Medical History:  Diagnosis Date  . Atrial fibrillation (HCC)    a. paroxysmal, on Eliquis for anticoagulation  . BPH (benign prostatic hyperplasia)   . Bronchitis   . Cataract   . COPD (chronic obstructive pulmonary disease) (H[facility])    2 liters O2 HS  . Depression   . Fatty tumor    waste and back  . Fibromyalgia   . GERD (gastroesophageal reflux disease)   . Glaucoma    bilateral eyes  . Gout   . Hereditary and idiopathic peripheral neuropathy 08/28/2015  . Hypertension   . Hypothyroidism   . Hypoxia   . Insomnia   . Osteoarthritis   . Osteoporosis   . Peripheral neuropathy   . Sleep apnea    uses cpap-add oxygen at night  . Spinal compression fracture (HCC) seventh vertebre  . Transient alteration of awareness 08/28/2015  . Type 2 diabetes mellitus ([place]) 03/08/2016    Past Surgical  History:  Procedure Laterality Date  . APPENDECTOMY  age 22  . CARPAL TUNNEL RELEASE Right    early 2000s  . CATARACT EXTRACTION     bilateral  . CHOLECYSTECTOMY  age 89  . EYE SURGERY    . FOOT ARTHRODESIS Right 02/02/2013   Procedure: RIGHT HALLUX METATARSAL PHALANGEAL JOINT ARTHRODESIS ;  Surgeon: Wylene Simmer, MD;  Location: [facility];  Service: Orthopedics;  Laterality: Right;  . INGUINAL HERNIA REPAIR  age 23   rt side  . NASAL CONCHA BULLOSA RESECTION  age 27  . PROSTATE SURGERY    . SHOULDER ARTHROSCOPY W/ ROTATOR CUFF REPAIR Right    early 2000s  . tonsil    . VASECTOMY  age 16    Social History   Socioeconomic History  . Marital status: Married    Spouse name: Not on file  . Number of children: 2  . Years of education: 21  . Highest education level: Master's degree (e.g., MA, MS, MEng, MEd, MSW, MBA)  Occupational History  . Occupation: RETIRED    Employer: RETIRED  Tobacco Use  . Smoking status: Former Smoker    Packs/day: 2.00    Years: 10.00    Pack years: 20.00    Types: Cigarettes    Quit date: 07/18/1975    Years since quitting: 44.7  . Smokeless tobacco: Never Used  Vaping Use  . Vaping Use: Never used  Substance and Sexual Activity  . Alcohol use: Yes    Comment: 1-2 drinks/day  . Drug use: No  . Sexual activity: Not Currently  Other Topics Concern  . Not on file  Social History Narrative  . Not on file   Social Determinants of Health   Financial Resource Strain: Medium Risk  . Difficulty of Paying Living Expenses: Somewhat hard  Food Insecurity:   .  Worried About Charity fundraiser in the Last Year: Not on file  . Ran Out of Food in the Last Year: Not on file  Transportation Needs:   . Lack of Transportation (Medical): Not on file  . Lack of Transportation (Non-Medical): Not on file  Physical Activity:   . Days of Exercise per Week: Not on file  . Minutes of Exercise per Session: Not on file  Stress:   . Feeling of  Stress : Not on file  Social Connections:   . Frequency of Communication with Friends and Family: Not on file  . Frequency of Social Gatherings with Friends and Family: Not on file  . Attends Religious Services: Not on file  . Active Member of Clubs or Organizations: Not on file  . Attends Archivist Meetings: Not on file  . Marital Status: Not on file    Family History  Problem Relation Age of Onset  . Coronary artery disease Brother   . Heart disease Father   . Lung cancer Father   . Kidney cancer Father   . Prostate cancer Father   . Arthritis Mother   . Lung cancer Mother   . Dementia Mother        Unspecified type, not Alzheimer's disease    Review of Systems  Constitutional: Negative for fatigue and fever.  Respiratory: Positive for cough (occ - if swallows saliva wrong). Negative for shortness of breath and wheezing.   Cardiovascular: Positive for leg swelling (mild). Negative for chest pain and palpitations.  Musculoskeletal: Negative for gait problem.  Neurological: Negative for dizziness, light-headedness and headaches.  Psychiatric/Behavioral: Positive for confusion (no per pt, yes per wife). Negative for dysphoric mood. The patient is nervous/anxious.        Foggy - ? Medication side effect, memory not good       Objective:   Vitals:   03/25/20 1529  BP: 124/78  Pulse: 89  Temp: 98.4 F (36.9 C)  SpO2: 97%   BP Readings from Last 3 Encounters:  03/25/20 124/78  03/21/20 126/70  03/15/20 (!) 168/80   Wt Readings from Last 3 Encounters:  03/25/20 197 lb (89.4 kg)  03/21/20 198 lb 6.4 oz (90 kg)  03/14/20 199 lb 11.8 oz (90.6 kg)   Body mass index is 30.85 kg/m.   Physical Exam    Constitutional: Appears well-developed and well-nourished. No distress.  HENT:  Head: Normocephalic and atraumatic.  Neck: Neck supple. No tracheal deviation present. No thyromegaly present.  No cervical lymphadenopathy Cardiovascular: Normal rate, regular  rhythm and normal heart sounds.   No murmur heard. No carotid bruit .  No edema Pulmonary/Chest: Effort normal and breath sounds normal. No respiratory distress. No wheezes. No rales.  Skin: Skin is warm and dry. Not diaphoretic.  Psychiatric: Normal mood and affect. Behavior is normal.   CT Head Wo Contrast CLINICAL DATA:  Follow-up intracranial hemorrhage. Fall with head injury.  EXAM: CT HEAD WITHOUT CONTRAST  TECHNIQUE: Contiguous axial images were obtained from the base of the skull through the vertex without intravenous contrast.  COMPARISON:  CT head 03/13/2020  FINDINGS: Brain: Small volume intraventricular hemorrhage is unchanged. No new hemorrhage. No hydrocephalus.  Generalized atrophy. Bifrontal subdural fluid collections have CSF density and appears slightly larger in the interval. Hygromas measure approximately 4 mm in diameter bilaterally. No acute infarct or mass.  Vascular: Negative for hyperdense vessel  Skull: Negative  Sinuses/Orbits: Paranasal sinuses clear. Bilateral cataract extraction. No orbital lesion.  Other: None  IMPRESSION: Small volume intraventricular hemorrhage in the right lateral ventricle unchanged. No new hemorrhage. No hydrocephalus.  Small bilateral subdural hygromas in the frontal lobes bilaterally appear slightly larger compared with yesterday. These were not present on the prior CT of 05/08/2019.  Electronically Signed   By: Franchot Gallo M.D.   On: 03/15/2020 09:23    Lab Results  Component Value Date   WBC 13.3 (H) 03/15/2020   HGB 12.5 (L) 03/15/2020   HCT 37.3 (L) 03/15/2020   PLT 126 (L) 03/15/2020   GLUCOSE 266 (H) 03/14/2020   CHOL 111 08/14/2019   TRIG 211.0 (H) 08/14/2019   HDL 37.60 (L) 08/14/2019   LDLDIRECT 51.0 08/14/2019   LDLCALC 66 11/11/2018   ALT 21 03/14/2020   AST 24 03/14/2020   NA 139 03/14/2020   K 4.2 03/14/2020   CL 102 03/14/2020   CREATININE 1.04 03/14/2020   BUN 26 (H)  03/14/2020   CO2 30 03/14/2020   TSH 3.13 08/14/2019   INR 0.9 03/14/2020   HGBA1C 8.1 (H) 03/14/2020   MICROALBUR 28.1 (H) 08/11/2017      Assessment & Plan:    See Problem List for Assessment and Plan of chronic medical problems.    This visit occurred during the SARS-CoV-2 public health emergency.  Safety protocols were in place, including screening questions prior to the visit, additional usage of staff PPE, and extensive cleaning of exam room while observing appropriate contact time as indicated for disinfecting solutions.

## 2020-03-25 ENCOUNTER — Encounter: Payer: Self-pay | Admitting: Internal Medicine

## 2020-03-25 ENCOUNTER — Other Ambulatory Visit: Payer: Self-pay

## 2020-03-25 ENCOUNTER — Ambulatory Visit (INDEPENDENT_AMBULATORY_CARE_PROVIDER_SITE_OTHER): Payer: Medicare Other | Admitting: Internal Medicine

## 2020-03-25 VITALS — BP 124/78 | HR 89 | Temp 98.4°F | Ht 67.0 in | Wt 197.0 lb

## 2020-03-25 DIAGNOSIS — S065XAA Traumatic subdural hemorrhage with loss of consciousness status unknown, initial encounter: Secondary | ICD-10-CM

## 2020-03-25 DIAGNOSIS — K219 Gastro-esophageal reflux disease without esophagitis: Secondary | ICD-10-CM

## 2020-03-25 DIAGNOSIS — R296 Repeated falls: Secondary | ICD-10-CM | POA: Diagnosis not present

## 2020-03-25 DIAGNOSIS — I10 Essential (primary) hypertension: Secondary | ICD-10-CM | POA: Diagnosis not present

## 2020-03-25 DIAGNOSIS — G4700 Insomnia, unspecified: Secondary | ICD-10-CM

## 2020-03-25 DIAGNOSIS — F330 Major depressive disorder, recurrent, mild: Secondary | ICD-10-CM

## 2020-03-25 DIAGNOSIS — S065X9A Traumatic subdural hemorrhage with loss of consciousness of unspecified duration, initial encounter: Secondary | ICD-10-CM

## 2020-03-25 DIAGNOSIS — M109 Gout, unspecified: Secondary | ICD-10-CM | POA: Diagnosis not present

## 2020-03-25 DIAGNOSIS — Z23 Encounter for immunization: Secondary | ICD-10-CM | POA: Diagnosis not present

## 2020-03-25 HISTORY — DX: Repeated falls: R29.6

## 2020-03-25 MED ORDER — METFORMIN HCL ER 750 MG PO TB24
750.0000 mg | ORAL_TABLET | Freq: Two times a day (BID) | ORAL | 1 refills | Status: DC
Start: 2020-03-25 — End: 2020-06-13

## 2020-03-25 NOTE — Assessment & Plan Note (Signed)
Chronic ambien d/c'd due to falls/amnesia Will avoid sleep medications Sleeping ok now w/ only melatonin

## 2020-03-25 NOTE — Assessment & Plan Note (Signed)
Chronic BP well controlled here, occ elevated at home - continue to monitor at home Continue diltiazem CD 300 mg daily, doxazosin 8 mg daily. hctz 25 mg daily

## 2020-03-25 NOTE — Assessment & Plan Note (Signed)
Chronic Denies gerd Will taper off omeprazole and take prn

## 2020-03-25 NOTE — Assessment & Plan Note (Signed)
Recurrent SDH from falls at night - likely secondary to [place] d/c'd, taking melatonin only - will avoid sleep meds eliquis on hold Repeat CT scan 10/18

## 2020-03-25 NOTE — Assessment & Plan Note (Signed)
Chronic Controlled, stable Continue cymbalta 60 mg BID

## 2020-03-25 NOTE — Assessment & Plan Note (Signed)
New problem Related to Jonathan [name] - this was stopped Will avoid sleep meds Balance - likely not great, has chronic back pain Will refer to PT Encouraged regular exercise

## 2020-03-25 NOTE — Assessment & Plan Note (Signed)
Chronic No gout ? allopurinol causing side effects -will d/c for now and monitor  If energy increases will continue off allopurinol  - will check uric acid level at next visit and if no change in symptoms may restart

## 2020-03-25 NOTE — Patient Instructions (Addendum)
Flu immunization administered today.    Medications reviewed and updated.  Changes include :   Stop allopurinol for now and see if that improves your energy level.  Increase metformin to twice daily.   Take the omeprazole every other day for a week and then stop.    Your prescription(s) have been submitted to your pharmacy. Please take as directed and contact our office if you believe you are having problem(s) with the medication(s).  A referral was ordered for physical therapy.     Someone from their office will call you to schedule an appointment.    Please followup in 2-3 weeks

## 2020-03-26 NOTE — Addendum Note (Signed)
Addended by: Marcina Millard on: 03/26/2020 09:14 AM   Modules accepted: Orders

## 2020-04-01 ENCOUNTER — Ambulatory Visit
Admission: RE | Admit: 2020-04-01 | Discharge: 2020-04-01 | Disposition: A | Discharge status: Home / Self Care | Payer: Medicare Other | Source: Ambulatory Visit | Attending: Neurosurgery | Admitting: Neurosurgery

## 2020-04-01 DIAGNOSIS — I615 Nontraumatic intracerebral hemorrhage, intraventricular: Secondary | ICD-10-CM | POA: Diagnosis not present

## 2020-04-01 DIAGNOSIS — S065X9A Traumatic subdural hemorrhage with loss of consciousness of unspecified duration, initial encounter: Secondary | ICD-10-CM

## 2020-04-01 DIAGNOSIS — G9389 Other specified disorders of brain: Secondary | ICD-10-CM | POA: Diagnosis not present

## 2020-04-01 DIAGNOSIS — I6200 Nontraumatic subdural hemorrhage, unspecified: Secondary | ICD-10-CM | POA: Diagnosis not present

## 2020-04-01 DIAGNOSIS — S065XAA Traumatic subdural hemorrhage with loss of consciousness status unknown, initial encounter: Secondary | ICD-10-CM

## 2020-04-01 DIAGNOSIS — Z87828 Personal history of other (healed) physical injury and trauma: Secondary | ICD-10-CM | POA: Diagnosis not present

## 2020-04-04 ENCOUNTER — Ambulatory Visit: Payer: Medicare Other | Attending: Internal Medicine | Admitting: Physical Therapy

## 2020-04-04 DIAGNOSIS — R2681 Unsteadiness on feet: Secondary | ICD-10-CM | POA: Insufficient documentation

## 2020-04-04 DIAGNOSIS — R2689 Other abnormalities of gait and mobility: Secondary | ICD-10-CM | POA: Insufficient documentation

## 2020-04-04 DIAGNOSIS — M6281 Muscle weakness (generalized): Secondary | ICD-10-CM | POA: Insufficient documentation

## 2020-04-11 DIAGNOSIS — N5201 Erectile dysfunction due to arterial insufficiency: Secondary | ICD-10-CM | POA: Diagnosis not present

## 2020-04-11 DIAGNOSIS — N401 Enlarged prostate with lower urinary tract symptoms: Secondary | ICD-10-CM | POA: Diagnosis not present

## 2020-04-11 DIAGNOSIS — R351 Nocturia: Secondary | ICD-10-CM | POA: Diagnosis not present

## 2020-04-11 DIAGNOSIS — R3915 Urgency of urination: Secondary | ICD-10-CM | POA: Diagnosis not present

## 2020-04-12 ENCOUNTER — Ambulatory Visit: Payer: Medicare Other

## 2020-04-12 ENCOUNTER — Other Ambulatory Visit: Payer: Self-pay

## 2020-04-12 DIAGNOSIS — M6281 Muscle weakness (generalized): Secondary | ICD-10-CM | POA: Diagnosis not present

## 2020-04-12 DIAGNOSIS — R2681 Unsteadiness on feet: Secondary | ICD-10-CM

## 2020-04-12 DIAGNOSIS — R2689 Other abnormalities of gait and mobility: Secondary | ICD-10-CM

## 2020-04-12 NOTE — Therapy (Signed)
[facility] [address] [place], [name], [address] Phone: [phone]   Fax:  [phone]  Physical Therapy Evaluation  Patient Details  Name: Jonathan [name] MRN: [id number] Date of Birth: 03-21-1933 Referring Provider (PT): Billey Gosling, MD   Encounter Date: 04/12/2020   PT End of Session - 04/12/20 1148    Visit Number 1    Number of Visits 13    Date for PT Re-Evaluation 06/11/20   POC for 6 weeks, Cert for 60 days   Authorization Type Medicare (10th Visit PN)    Progress Note Due on Visit 10    PT Start Time 1100    PT Stop Time 1144    PT Time Calculation (min) 44 min    Equipment Utilized During Treatment Gait belt    Activity Tolerance Patient tolerated treatment well    Behavior During Therapy WFL for tasks assessed/performed           Past Medical History:  Diagnosis Date   Atrial fibrillation ([place])    a. paroxysmal, on Eliquis for anticoagulation   BPH (benign prostatic hyperplasia)    Bronchitis    Cataract    COPD (chronic obstructive pulmonary disease) (HCC)    2 liters O2 HS   Depression    Fatty tumor    waste and back   Fibromyalgia    GERD (gastroesophageal reflux disease)    Glaucoma    bilateral eyes   Gout    Hereditary and idiopathic peripheral neuropathy 08/28/2015   Hypertension    Hypothyroidism    Hypoxia    Insomnia    Osteoarthritis    Osteoporosis    Peripheral neuropathy    Sleep apnea    uses cpap-add oxygen at night   Spinal compression fracture (HCC) seventh vertebre   Transient alteration of awareness 08/28/2015   Type 2 diabetes mellitus ([place]) 03/08/2016    Past Surgical History:  Procedure Laterality Date   APPENDECTOMY  age 59   CARPAL TUNNEL RELEASE Right    early 2000s   CATARACT EXTRACTION     bilateral   CHOLECYSTECTOMY  age 73   EYE SURGERY     FOOT ARTHRODESIS Right 02/02/2013   Procedure: RIGHT HALLUX METATARSAL  PHALANGEAL JOINT ARTHRODESIS ;  Surgeon: Wylene Simmer, MD;  Location: [facility];  Service: Orthopedics;  Laterality: Right;   INGUINAL HERNIA REPAIR  age 4   rt side   NASAL CONCHA BULLOSA RESECTION  age 69   PROSTATE SURGERY     SHOULDER ARTHROSCOPY W/ ROTATOR CUFF REPAIR Right    early 2000s   tonsil     VASECTOMY  age 89    There were no vitals filed for this visit.    Subjective Assessment - 04/12/20 1105    Subjective Patient was admitted 9/30 - 10/1 for recurrent falls. As result of fall, he hit L side of his head. CT showed small acute intraventricular hemorrhage within R lateral ventricle. Patient reports he fell 3 times in one night. Patient reports amnesia about the events, does not recall. Patient was taken off ambien. Since this no falls. Patient currently walks without an AD.    Pertinent History pAF on Eliquis, chronic pain on oxycodone, previous SDH, COPD on home O2 noct, HTN, hypothyroidism, OSA on CPAP, and DM, Neuropathy    Limitations Standing;Walking    Patient Stated Goals Improve the Balance; Improve Core Strength    Currently in Pain? Yes  Pain Location Generalized    Pain Orientation --   generalized all over   Pain Descriptors / Indicators Aching    Pain Type Chronic pain    Pain Onset More than a month ago    Pain Frequency Constant    Aggravating Factors  standing; prolonged position    Pain Relieving Factors voltaren cream; sleep on heating pad              [facility] PT Assessment - 04/12/20 0001      Assessment   Medical Diagnosis Recurrent Falls    Referring Provider (PT) Billey Gosling, MD    Onset Date/Surgical Date 03/14/20   admitted for fall   Hand Dominance Right    Next MD Visit 04/18/20      Precautions   Precautions Fall      Balance Screen   Has the patient fallen in the past 6 months Yes    How many times? 5    Has the patient had a decrease in activity level because of a fear of falling?  No    Is the patient  reluctant to leave their home because of a fear of falling?  No      Home Ecologist residence    Living Arrangements Spouse/significant other    Available Help at Discharge Family    Type of [place] to enter    Entrance Stairs-Number of Steps 3    Entrance Stairs-Rails Right;Left;Cannot reach both   both sides in garage and on front of house   Home Layout Two level    Alternate Level Stairs-Number of Steps 12    Alternate Level Stairs-Rails Right;Left   on both first half of stairs   Additional Comments has stair lift for wife, takes care of wife as handicaped       Prior Function   Level of Independence Independent    Vocation Retired      Associate Professor   Overall Cognitive Status Within Functional Limits for tasks assessed      Sensation   Light Touch Impaired by gross assessment    Additional Comments neuropathy on BLE's, sensation intact on upper LE      ROM / Strength   AROM / PROM / Strength AROM;Strength      AROM   Overall AROM  Within functional limits for tasks performed    Overall AROM Comments WFL for BLE's      Strength   Overall Strength Deficits    Overall Strength Comments B Hips Grossly 4/5, B Knee Flex/Ext 4/5    Strength Assessment Site Ankle    Right/Left Ankle Right;Left    Right Ankle Dorsiflexion 3+/5    Left Ankle Dorsiflexion 3+/5      Transfers   Transfers Sit to Stand;Stand to Sit    Sit to Stand 5: Supervision;Without upper extremity assist;From chair/3-in-1    Five time sit to stand comments  12.12 secs without UE suppor from standard chair    Stand to Sit 5: Supervision;Without upper extremity assist;To chair/3-in-1    Comments supervision with complete; mild decreased in control with descent      Ambulation/Gait   Ambulation/Gait Yes    Ambulation/Gait Assistance 5: Supervision    Ambulation/Gait Assistance Details completed ambulation around therapy gym without AD, no instances of  imbalance noted.     Ambulation Distance (Feet) 100 Feet    Assistive device None  Gait Pattern Step-through pattern    Ambulation Surface Level;Indoor    Gait velocity 10.13 secs = 3.23 ft/sec    Stairs Yes    Stairs Assistance 5: Supervision    Stairs Assistance Details (indicate cue type and reason) ascending/descending stairs wh B rails with alternating pattern.     Stair Management Technique Two rails;Alternating pattern;Forwards    Number of Stairs 4    Height of Stairs 6      Standardized Balance Assessment   Standardized Balance Assessment Timed Up and Go Test      Timed Up and Go Test   TUG Normal TUG    Normal TUG (seconds) 12.16    TUG Comments w/o AD      High Level Balance   High Level Balance Comments Completed M-CSTIB: able to complete situation 1 +2 for full 30 seconds, situation 3: 23 seconds, situation 4: <5 seconds. Increased sway noted with vision removed.       Functional Gait  Assessment   Gait assessed  Yes    Gait Level Surface Walks 20 ft in less than 7 sec but greater than 5.5 sec, uses assistive device, slower speed, mild gait deviations, or deviates 6-10 in outside of the 12 in walkway width.    Change in Gait Speed Able to smoothly change walking speed without loss of balance or gait deviation. Deviate no more than 6 in outside of the 12 in walkway width.    Gait with Horizontal Head Turns Performs head turns smoothly with slight change in gait velocity (eg, minor disruption to smooth gait path), deviates 6-10 in outside 12 in walkway width, or uses an assistive device.    Gait with Vertical Head Turns Performs task with slight change in gait velocity (eg, minor disruption to smooth gait path), deviates 6 - 10 in outside 12 in walkway width or uses assistive device    Gait and Pivot Turn Turns slowly, requires verbal cueing, or requires several small steps to catch balance following turn and stop    Step Over Obstacle Is able to step over one shoe box  (4.5 in total height) without changing gait speed. No evidence of imbalance.    Gait with Narrow Base of Support Ambulates less than 4 steps heel to toe or cannot perform without assistance.    Gait with Eyes Closed Walks 20 ft, slow speed, abnormal gait pattern, evidence for imbalance, deviates 10-15 in outside 12 in walkway width. Requires more than 9 sec to ambulate 20 ft.    Ambulating Backwards Walks 20 ft, uses assistive device, slower speed, mild gait deviations, deviates 6-10 in outside 12 in walkway width.    Steps Alternating feet, must use rail.    Total Score 17    FGA comment: 17/30 = High Fall Risk                      Objective measurements completed on examination: See above findings.               PT Education - 04/12/20 1259    Education Details Educated on [employer]) Educated Patient    Methods Explanation    Comprehension Verbalized understanding            PT Short Term Goals - 04/12/20 1302      PT SHORT TERM GOAL #1   Title Patient will be independent with balance/strengthening HEP (All STGS Due: 05/03/20)  Baseline no HEP established    Time 3    Period Weeks    Status New    Target Date 05/03/20      PT SHORT TERM GOAL #2   Title Patient improve FGA to >/= 19/30 to demonstrate improved balance and reduced fall risk    Baseline 17/30    Time 3    Period Weeks    Status New      PT SHORT TERM GOAL #3   Title Patient will be educated on fall prevention/strategies to further reduce fall risk    Baseline dependent    Time 3    Period Weeks    Status New             PT Long Term Goals - 04/12/20 1306      PT LONG TERM GOAL #1   Title Patient will be independent with with final balance/strengthening HEP (All LTGs Due: 05/24/20)    Baseline no HEP established    Time 6    Period Weeks    Status New    Target Date 05/24/20      PT LONG TERM GOAL #2   Title Patient will improve FGA to >/=  23/30 to demonstrate reduced fall risk.    Baseline 19/30    Time 6    Period Weeks    Status New      PT LONG TERM GOAL #3   Title Patient will demo ability to complete situation 3 for 30 seconds, and situation 4 at least 20 seconds.    Baseline 23 secs on Situation 3, <5 seconds on Situation 4    Time 6    Period Weeks    Status New      PT LONG TERM GOAL #4   Title Patient will be able to complete 5x sit <> stand without UE support in </= 10 seconds to demonstrate improved balance    Baseline 12.1 secs    Time 6    Period Weeks    Status New      PT LONG TERM GOAL #5   Title Patient will improve TUG to </= 10 secs to demonstrate improved functional mobility and reduced fall risk    Baseline 12.16    Time 6    Period Weeks    Status New                  Plan - 04/12/20 1315    Clinical Impression Statement Patient is a 86 y.o. male that was referred to Neuro OPPT for recurrent falls. Patient had serious fall on 9/30 in which he hit the L side of head, CT showed small acute intraventricular hemorrhage within R lateral ventricle. Patients PMH is significant for the following: pAF on Eliquis, chronic pain on oxycodone and Ambien, previous SDH, COPD on home O2 noct, HTN, hypothyroidism, OSA on CPAP, and DM. Patient presents with the following impairments upon evaluation: decreased strength, impaired sensation, impaired balance, abnormal gait, and reduced fall risk. Patient scored 17/30 today with FGA, TUG in 12.16 secs, and 5x sit <> stand in 12.1 secs. All demonstrating patient is at an increased risk for falls at this time. Patient will benefit from skilled PT services to maximize functional mobility and reduce fall risk.    Personal Factors and Comorbidities Comorbidity 3+    Comorbidities pAF on Eliquis, chronic pain on oxycodone and Ambien, previous SDH, COPD on home O2 noct, HTN, hypothyroidism, OSA on CPAP, and DM  Examination-Activity Limitations Locomotion  Level;Stairs    Examination-Participation Restrictions Community Activity;Yard Work    Merchant navy officer Evolving/Moderate complexity    Clinical Decision Making Moderate    Rehab Potential Good    PT Frequency 2x / week    PT Duration 6 weeks    PT Treatment/Interventions ADLs/Self Care Home Management;Electrical Stimulation;DME Instruction;Stair training;Functional mobility training;Gait training;Therapeutic activities;Therapeutic exercise;Balance training;Neuromuscular re-education;Patient/family education;Vestibular    PT Next Visit Plan Initiate Balance/Strengthening HEP    Consulted and Agree with Plan of Care Patient           Patient will benefit from skilled therapeutic intervention in order to improve the following deficits and impairments:  Decreased balance, Decreased strength, Pain, Impaired sensation, Difficulty walking, Decreased endurance, Abnormal gait  Visit Diagnosis: Muscle weakness (generalized)  Other abnormalities of gait and mobility  Unsteadiness on feet     Problem List Patient Active Problem List   Diagnosis Date Noted   Recurrent falls 03/25/2020   SDH (subdural hematoma) ([place]) 03/21/2020   Intracranial hemorrhage ([place]) 03/14/2020   Fall at home, subsequent encounter 03/14/2020   Leukocytosis 03/14/2020   Nausea 09/13/2019   Rotator cuff arthropathy of left shoulder 07/21/2019   Dupuytren's disease of palm 07/12/2019   Sebaceous cyst 06/27/2019   COVID-19 05/08/2019   Acute encephalopathy 05/08/2019   Obstructive sleep apnea    Asymmetrical left sensorineural hearing loss 07/12/2018   Chronic anticoagulation 12/21/2017   Compression fracture of thoracic vertebra (HCC) 09/21/2017   Degeneration of thoracic intervertebral disc 09/21/2017   Thoracic back pain 08/10/2017   Degenerative joint disease of hand 07/14/2017   Osteoarthritis    Hypoxia    Fibromyalgia    Fatty tumor    Cataract    BPH  (benign prostatic hyperplasia)    Chronic atrial fibrillation (HCC)    Insomnia 03/10/2017   Chronic pain, legs and back 03/10/2017   Hyperlipidemia 03/10/2017   Obesity (BMI 30.0-34.9) 12/08/2016   Depression 09/07/2016   Spondylolisthesis 09/07/2016   Hypothyroidism 03/08/2016   GERD (gastroesophageal reflux disease) 03/08/2016   Gout 03/08/2016   Glaucoma 03/08/2016   Type 2 diabetes mellitus ([place]) 03/08/2016   Hereditary and idiopathic peripheral neuropathy 08/28/2015   Essential hypertension 12/04/2011   Allergic rhinitis, seasonal 12/04/2011   COPD (chronic obstructive pulmonary disease) ([place]) 07/18/2011    Jones Bales, PT, DPT 04/12/2020, 1:21 PM  [facility] [address] [place], [name], [address] Phone: [phone]   Fax:  [phone]  Name: Jonathan [name] MRN: [id number] Date of Birth: 27-Jul-1932

## 2020-04-15 ENCOUNTER — Ambulatory Visit: Payer: Medicare Other | Admitting: Internal Medicine

## 2020-04-15 ENCOUNTER — Ambulatory Visit: Payer: Medicare Other | Attending: Internal Medicine | Admitting: Physical Therapy

## 2020-04-15 DIAGNOSIS — R2681 Unsteadiness on feet: Secondary | ICD-10-CM | POA: Insufficient documentation

## 2020-04-15 DIAGNOSIS — R2689 Other abnormalities of gait and mobility: Secondary | ICD-10-CM | POA: Insufficient documentation

## 2020-04-15 DIAGNOSIS — M6281 Muscle weakness (generalized): Secondary | ICD-10-CM | POA: Insufficient documentation

## 2020-04-16 ENCOUNTER — Telehealth: Payer: Self-pay | Admitting: Pharmacist

## 2020-04-16 DIAGNOSIS — S065X9A Traumatic subdural hemorrhage with loss of consciousness of unspecified duration, initial encounter: Secondary | ICD-10-CM | POA: Diagnosis not present

## 2020-04-16 NOTE — Progress Notes (Signed)
Chronic Care Management Pharmacy Assistant   Name: Jonathan [name]  MRN: [id number] DOB: [birth year]  Reason for Encounter: PAP Follow Up   PCP : Binnie Rail, MD  Allergies:   Allergies  Allergen Reactions  . Other Other (See Comments)    DUST-Other reaction(s): Respiratory distress    Medications: Outpatient Encounter Medications as of 04/16/2020  Medication Sig  . acetaminophen (TYLENOL) 650 MG CR tablet Take 650 mg by mouth in the morning and at bedtime. Pt takes 2 tabs 2 times daily  . allopurinol (ZYLOPRIM) 300 MG tablet TAKE 1 TABLET BY MOUTH IN  THE EVENING (Patient taking differently: Take 300 mg by mouth every evening. )  . atorvastatin (LIPITOR) 20 MG tablet TAKE 1 TABLET BY MOUTH EVERY DAY (Patient taking differently: Take 20 mg by mouth daily. )  . bimatoprost (LUMIGAN) 0.01 % SOLN Place 1 drop into both eyes at bedtime.   . Biotin 5000 MCG TABS Take 5,000 mcg by mouth daily.  . blood glucose meter kit and supplies KIT Dispense based on patient and insurance preference. Use daily as directed. (FOR E11.9).  . calcium-vitamin D (OSCAL WITH D) 500-200 MG-UNIT tablet Take 1 tablet by mouth daily with breakfast.  . Capsaicin 0.1 % CREA Apply topically.  . colchicine (COLCRYS) 0.6 MG tablet Take 0.6 mg by mouth daily as needed (flareups).   . diltiazem (CARDIZEM CD) 300 MG 24 hr capsule TAKE 1 CAPSULE BY MOUTH  DAILY (Patient taking differently: Take 300 mg by mouth daily. )  . diltiazem (CARDIZEM) 30 MG tablet Take 1 tablet (30 mg total) by mouth 2 (two) times daily as needed (sustained elevated heart rates >100 bpm for > 20 minutes.).  Marland Kitchen doxazosin (CARDURA) 8 MG tablet TAKE 1 TABLET BY MOUTH EVERY DAY (Patient taking differently: Take 8 mg by mouth daily. )  . DULoxetine (CYMBALTA) 60 MG capsule Take 1 capsule (60 mg total) by mouth 2 (two) times daily.  Marland Kitchen glucose blood test strip Use to check blood sugar daily. E11.9  . hydrochlorothiazide (HYDRODIURIL) 25 MG tablet  Take 1 tablet (25 mg total) by mouth daily.  Marland Kitchen KLOR-CON M20 20 MEQ tablet TAKE 1 TABLET BY MOUTH EVERY DAY (Patient taking differently: Take 20 mEq by mouth daily. )  . Lancets MISC Use to check blood sugars daily. E11.9  . levothyroxine (SYNTHROID) 112 MCG tablet Take 1 tablet (112 mcg total) by mouth daily before breakfast.  . magnesium oxide (MAG-OX) 400 MG tablet Take 400 mg by mouth daily.  . Melatonin 5 MG TABS Take 5 mg by mouth at bedtime.  . metFORMIN (GLUCOPHAGE-XR) 750 MG 24 hr tablet Take 1 tablet (750 mg total) by mouth in the morning and at bedtime.  . mometasone (NASONEX) 50 MCG/ACT nasal spray Place 2 sprays into the nose daily. (Patient taking differently: Place 2 sprays into the nose as needed (allergies). )  . Multiple Vitamin (MULTIVITAMIN) tablet Take 1 tablet by mouth daily.  . nitroGLYCERIN (NITROSTAT) 0.4 MG SL tablet Place 1 tablet (0.4 mg total) under the tongue every 5 (five) minutes as needed for chest pain.  Marland Kitchen omeprazole (PRILOSEC) 20 MG capsule TAKE 1 CAPSULE BY MOUTH  DAILY (Patient taking differently: Take 20 mg by mouth daily. )  . RESTASIS 0.05 % ophthalmic emulsion Place 1 drop into both eyes 2 (two) times daily.   . sildenafil (REVATIO) 20 MG tablet Take 40-100 mg by mouth daily as needed.  . solifenacin (VESICARE) 5 MG tablet  Take 10 mg by mouth daily.   . vitamin C (ASCORBIC ACID) 500 MG tablet Take 500 mg by mouth 2 (two) times daily.   Marland Kitchen VITAMIN D, CHOLECALCIFEROL, PO Take 25 mcg by mouth daily.    No facility-administered encounter medications on file as of 04/16/2020.    Current Diagnosis: Patient Active Problem List   Diagnosis Date Noted  . Recurrent falls 03/25/2020  . SDH (subdural hematoma) ([place]) 03/21/2020  . Intracranial hemorrhage ([place]) 03/14/2020  . Fall at home, subsequent encounter 03/14/2020  . Leukocytosis 03/14/2020  . Nausea 09/13/2019  . Rotator cuff arthropathy of left shoulder 07/21/2019  . Dupuytren's disease of palm 07/12/2019   . Sebaceous cyst 06/27/2019  . COVID-19 05/08/2019  . Acute encephalopathy 05/08/2019  . Obstructive sleep apnea   . Asymmetrical left sensorineural hearing loss 07/12/2018  . Chronic anticoagulation 12/21/2017  . Compression fracture of thoracic vertebra ([place]) 09/21/2017  . Degeneration of thoracic intervertebral disc 09/21/2017  . Thoracic back pain 08/10/2017  . Degenerative joint disease of hand 07/14/2017  . Osteoarthritis   . Hypoxia   . Fibromyalgia   . Fatty tumor   . Cataract   . BPH (benign prostatic hyperplasia)   . Chronic atrial fibrillation ([place])   . Insomnia 03/10/2017  . Chronic pain, legs and back 03/10/2017  . Hyperlipidemia 03/10/2017  . Obesity (BMI 30.0-34.9) 12/08/2016  . Depression 09/07/2016  . Spondylolisthesis 09/07/2016  . Hypothyroidism 03/08/2016  . GERD (gastroesophageal reflux disease) 03/08/2016  . Gout 03/08/2016  . Glaucoma 03/08/2016  . Type 2 diabetes mellitus ([place]) 03/08/2016  . Hereditary and idiopathic peripheral neuropathy 08/28/2015  . Essential hypertension 12/04/2011  . Allergic rhinitis, seasonal 12/04/2011  . COPD (chronic obstructive pulmonary disease) ([place]) 07/18/2011    Goals Addressed   None     Follow-Up: Pharmacist Review  The patient spoke with Clinical Pharmacist Mendel Ryder on 03/11/20, and they discussed patient assitance for  Eliquis. The patient was suppose to bring in proof of income and prescription costs to Dr. Quay Burow office so that patient assistance form could be faxed. I spoke with the patient on 04/09/20 and asked if he was able to get proof of income and co-pays from the pharmacy. Patient stated that he had other pressing things come up that he needed to take care of and was not able to get the information he needed as of yet.

## 2020-04-17 NOTE — Patient Instructions (Addendum)
   Medications reviewed and updated.  Changes include :   Restart the eliquis.   See cardiology for follow up for your Afib.      Please followup in 3 months

## 2020-04-17 NOTE — Progress Notes (Signed)
Subjective:    Patient ID: Jonathan [name], male    DOB: June 26, 1932, 86 y.o.   MRN: [id number]  HPI The patient is here for follow up of their chronic medical problems, including fatigue/low energy for which we stopped allopurinol to see if that was causing this, depression, GERD for which we stopped the omeprazole, falls, insomnia, depression, DM for which we increased his metformin and unsteadiness for which I referred him to PT.   He feels he is having a lot of afib.  His HR at home has been in the 90's at times - occ over 100. He does feel fatigued at times.  He wonders if this is contributing to his fatigue.  BP  128/76-162/85  Sugars - 120-192 fasting.  He feels he is eating well overall.  He has a lot of back pain.  He has done PT orientation.    Sharp L chest pain -pain lasted about 1 second.  For the first time he did take sublingual nitroglycerin, but by the time he took it the pain was gone.     Medications and allergies reviewed with patient and updated if appropriate.  Patient Active Problem List   Diagnosis Date Noted  . Recurrent falls 03/25/2020  . SDH (subdural hematoma) ([place]) 03/21/2020  . Intracranial hemorrhage ([place]) 03/14/2020  . Fall at home, subsequent encounter 03/14/2020  . Leukocytosis 03/14/2020  . Nausea 09/13/2019  . Rotator cuff arthropathy of left shoulder 07/21/2019  . Dupuytren's disease of palm 07/12/2019  . Sebaceous cyst 06/27/2019  . COVID-19 05/08/2019  . Acute encephalopathy 05/08/2019  . Obstructive sleep apnea   . Asymmetrical left sensorineural hearing loss 07/12/2018  . Chronic anticoagulation 12/21/2017  . Compression fracture of thoracic vertebra ([place]) 09/21/2017  . Degeneration of thoracic intervertebral disc 09/21/2017  . Thoracic back pain 08/10/2017  . Degenerative joint disease of hand 07/14/2017  . Osteoarthritis   . Hypoxia   . Fibromyalgia   . Fatty tumor   . Cataract   . BPH (benign prostatic hyperplasia)   .  Chronic atrial fibrillation ([place])   . Insomnia 03/10/2017  . Chronic pain, legs and back 03/10/2017  . Hyperlipidemia 03/10/2017  . Obesity (BMI 30.0-34.9) 12/08/2016  . Depression 09/07/2016  . Spondylolisthesis 09/07/2016  . Hypothyroidism 03/08/2016  . GERD (gastroesophageal reflux disease) 03/08/2016  . Gout 03/08/2016  . Glaucoma 03/08/2016  . Type 2 diabetes mellitus ([place]) 03/08/2016  . Hereditary and idiopathic peripheral neuropathy 08/28/2015  . Essential hypertension 12/04/2011  . Allergic rhinitis, seasonal 12/04/2011  . COPD (chronic obstructive pulmonary disease) ([place]) 07/18/2011    Current Outpatient Medications on File Prior to Visit  Medication Sig Dispense Refill  . acetaminophen (TYLENOL) 650 MG CR tablet Take 650 mg by mouth in the morning and at bedtime. Pt takes 2 tabs 2 times daily    . atorvastatin (LIPITOR) 20 MG tablet TAKE 1 TABLET BY MOUTH EVERY DAY (Patient taking differently: Take 20 mg by mouth daily. ) 90 tablet 1  . bimatoprost (LUMIGAN) 0.01 % SOLN Place 1 drop into both eyes at bedtime.     . Biotin 5000 MCG TABS Take 5,000 mcg by mouth daily.    . blood glucose meter kit and supplies KIT Dispense based on patient and insurance preference. Use daily as directed. (FOR E11.9). 1 each 0  . calcium-vitamin D (OSCAL WITH D) 500-200 MG-UNIT tablet Take 1 tablet by mouth daily with breakfast.    . Capsaicin 0.1 % CREA Apply topically.    Marland Kitchen  colchicine (COLCRYS) 0.6 MG tablet Take 0.6 mg by mouth daily as needed (flareups).     . diltiazem (CARDIZEM CD) 300 MG 24 hr capsule TAKE 1 CAPSULE BY MOUTH  DAILY (Patient taking differently: Take 300 mg by mouth daily. ) 90 capsule 3  . diltiazem (CARDIZEM) 30 MG tablet Take 1 tablet (30 mg total) by mouth 2 (two) times daily as needed (sustained elevated heart rates >100 bpm for > 20 minutes.). 30 tablet 2  . doxazosin (CARDURA) 8 MG tablet TAKE 1 TABLET BY MOUTH EVERY DAY (Patient taking differently: Take 8 mg by mouth  daily. ) 90 tablet 1  . DULoxetine (CYMBALTA) 60 MG capsule Take 1 capsule (60 mg total) by mouth 2 (two) times daily. 180 capsule 1  . glucose blood test strip Use to check blood sugar daily. E11.9 100 each 3  . hydrochlorothiazide (HYDRODIURIL) 25 MG tablet Take 1 tablet (25 mg total) by mouth daily. 90 tablet 1  . KLOR-CON M20 20 MEQ tablet TAKE 1 TABLET BY MOUTH EVERY DAY (Patient taking differently: Take 20 mEq by mouth daily. ) 90 tablet 1  . Lancets MISC Use to check blood sugars daily. E11.9 100 each 3  . levothyroxine (SYNTHROID) 112 MCG tablet Take 1 tablet (112 mcg total) by mouth daily before breakfast. 90 tablet 1  . magnesium oxide (MAG-OX) 400 MG tablet Take 400 mg by mouth daily.    . Melatonin 5 MG TABS Take 5 mg by mouth at bedtime.    . metFORMIN (GLUCOPHAGE-XR) 750 MG 24 hr tablet Take 1 tablet (750 mg total) by mouth in the morning and at bedtime. 180 tablet 1  . mometasone (NASONEX) 50 MCG/ACT nasal spray Place 2 sprays into the nose daily. (Patient taking differently: Place 2 sprays into the nose as needed (allergies). ) 17 g 3  . Multiple Vitamin (MULTIVITAMIN) tablet Take 1 tablet by mouth daily.    . nitroGLYCERIN (NITROSTAT) 0.4 MG SL tablet Place 1 tablet (0.4 mg total) under the tongue every 5 (five) minutes as needed for chest pain. 90 tablet 3  . omeprazole (PRILOSEC) 20 MG capsule TAKE 1 CAPSULE BY MOUTH  DAILY (Patient taking differently: Take 20 mg by mouth daily. ) 90 capsule 3  . RESTASIS 0.05 % ophthalmic emulsion Place 1 drop into both eyes 2 (two) times daily.     . sildenafil (REVATIO) 20 MG tablet Take 40-100 mg by mouth daily as needed.    . solifenacin (VESICARE) 10 MG tablet Take 10 mg by mouth daily.    . solifenacin (VESICARE) 5 MG tablet Take 10 mg by mouth daily.     . vitamin C (ASCORBIC ACID) 500 MG tablet Take 500 mg by mouth 2 (two) times daily.     Marland Kitchen VITAMIN D, CHOLECALCIFEROL, PO Take 25 mcg by mouth daily.     Marland Kitchen allopurinol (ZYLOPRIM) 300 MG  tablet TAKE 1 TABLET BY MOUTH IN  THE EVENING (Patient not taking: Reported on 04/18/2020) 90 tablet 3   No current facility-administered medications on file prior to visit.    Past Medical History:  Diagnosis Date  . Atrial fibrillation (HCC)    a. paroxysmal, on Eliquis for anticoagulation  . BPH (benign prostatic hyperplasia)   . Bronchitis   . Cataract   . COPD (chronic obstructive pulmonary disease) (HCC)    2 liters O2 HS  . Depression   . Fatty tumor    waste and back  . Fibromyalgia   .  GERD (gastroesophageal reflux disease)   . Glaucoma    bilateral eyes  . Gout   . Hereditary and idiopathic peripheral neuropathy 08/28/2015  . Hypertension   . Hypothyroidism   . Hypoxia   . Insomnia   . Osteoarthritis   . Osteoporosis   . Peripheral neuropathy   . Sleep apnea    uses cpap-add oxygen at night  . Spinal compression fracture (HCC) seventh vertebre  . Transient alteration of awareness 08/28/2015  . Type 2 diabetes mellitus ([place]) 03/08/2016    Past Surgical History:  Procedure Laterality Date  . APPENDECTOMY  age 78  . CARPAL TUNNEL RELEASE Right    early 2000s  . CATARACT EXTRACTION     bilateral  . CHOLECYSTECTOMY  age 30  . EYE SURGERY    . FOOT ARTHRODESIS Right 02/02/2013   Procedure: RIGHT HALLUX METATARSAL PHALANGEAL JOINT ARTHRODESIS ;  Surgeon: Wylene Simmer, MD;  Location: [facility];  Service: Orthopedics;  Laterality: Right;  . INGUINAL HERNIA REPAIR  age 85   rt side  . NASAL CONCHA BULLOSA RESECTION  age 21  . PROSTATE SURGERY    . SHOULDER ARTHROSCOPY W/ ROTATOR CUFF REPAIR Right    early 2000s  . tonsil    . VASECTOMY  age 73    Social History   Socioeconomic History  . Marital status: Married    Spouse name: Not on file  . Number of children: 2  . Years of education: 61  . Highest education level: Master's degree (e.g., MA, MS, MEng, MEd, MSW, MBA)  Occupational History  . Occupation: RETIRED    Employer: RETIRED   Tobacco Use  . Smoking status: Former Smoker    Packs/day: 2.00    Years: 10.00    Pack years: 20.00    Types: Cigarettes    Quit date: 07/18/1975    Years since quitting: 44.7  . Smokeless tobacco: Never Used  Vaping Use  . Vaping Use: Never used  Substance and Sexual Activity  . Alcohol use: Yes    Comment: 1-2 drinks/day  . Drug use: No  . Sexual activity: Not Currently  Other Topics Concern  . Not on file  Social History Narrative  . Not on file   Social Determinants of Health   Financial Resource Strain: Medium Risk  . Difficulty of Paying Living Expenses: Somewhat hard  Food Insecurity:   . Worried About Charity fundraiser in the Last Year: Not on file  . Ran Out of Food in the Last Year: Not on file  Transportation Needs:   . Lack of Transportation (Medical): Not on file  . Lack of Transportation (Non-Medical): Not on file  Physical Activity:   . Days of Exercise per Week: Not on file  . Minutes of Exercise per Session: Not on file  Stress:   . Feeling of Stress : Not on file  Social Connections:   . Frequency of Communication with Friends and Family: Not on file  . Frequency of Social Gatherings with Friends and Family: Not on file  . Attends Religious Services: Not on file  . Active Member of Clubs or Organizations: Not on file  . Attends Archivist Meetings: Not on file  . Marital Status: Not on file    Family History  Problem Relation Age of Onset  . Coronary artery disease Brother   . Heart disease Father   . Lung cancer Father   . Kidney cancer Father   .  Prostate cancer Father   . Arthritis Mother   . Lung cancer Mother   . Dementia Mother        Unspecified type, not Alzheimer's disease    Review of Systems  Constitutional: Positive for fatigue. Negative for appetite change and fever.  Respiratory: Negative for shortness of breath.   Cardiovascular: Positive for chest pain (atypical) and leg swelling. Negative for palpitations.   Musculoskeletal: Positive for gait problem (unsteady).  Neurological: Negative for dizziness, light-headedness and headaches.  Psychiatric/Behavioral: Positive for sleep disturbance (going to sleep better - walks 3-5 times to urinate; naps 1-2 times per day).       Objective:   Vitals:   04/18/20 1111  BP: 140/76  Pulse: (!) 58  Temp: 98.1 F (36.7 C)  SpO2: 99%   BP Readings from Last 3 Encounters:  04/18/20 140/76  03/25/20 124/78  03/21/20 126/70   Wt Readings from Last 3 Encounters:  04/18/20 200 lb (90.7 kg)  03/25/20 197 lb (89.4 kg)  03/21/20 198 lb 6.4 oz (90 kg)   Body mass index is 31.32 kg/m.   Physical Exam    Constitutional: Appears well-developed and well-nourished. No distress.  HENT:  Head: Normocephalic and atraumatic.  Neck: Neck supple. No tracheal deviation present. No thyromegaly present.  No cervical lymphadenopathy Cardiovascular: Normal rate, regular rhythm and normal heart sounds.   No murmur heard. No carotid bruit .  No edema Pulmonary/Chest: Effort normal and breath sounds normal. No respiratory distress. No has no wheezes. No rales.  Skin: Skin is warm and dry. Not diaphoretic.  Psychiatric: Normal mood and affect. Behavior is normal.      Assessment & Plan:    See Problem List for Assessment and Plan of chronic medical problems.    This visit occurred during the SARS-CoV-2 public health emergency.  Safety protocols were in place, including screening questions prior to the visit, additional usage of staff PPE, and extensive cleaning of exam room while observing appropriate contact time as indicated for disinfecting solutions.

## 2020-04-18 ENCOUNTER — Encounter: Payer: Self-pay | Admitting: Internal Medicine

## 2020-04-18 ENCOUNTER — Ambulatory Visit: Payer: Medicare Other | Admitting: Internal Medicine

## 2020-04-18 ENCOUNTER — Ambulatory Visit (INDEPENDENT_AMBULATORY_CARE_PROVIDER_SITE_OTHER): Payer: Medicare Other | Admitting: Internal Medicine

## 2020-04-18 ENCOUNTER — Other Ambulatory Visit: Payer: Self-pay

## 2020-04-18 VITALS — BP 140/76 | HR 58 | Temp 98.1°F | Ht 67.0 in | Wt 200.0 lb

## 2020-04-18 DIAGNOSIS — E1165 Type 2 diabetes mellitus with hyperglycemia: Secondary | ICD-10-CM

## 2020-04-18 DIAGNOSIS — K219 Gastro-esophageal reflux disease without esophagitis: Secondary | ICD-10-CM

## 2020-04-18 DIAGNOSIS — F330 Major depressive disorder, recurrent, mild: Secondary | ICD-10-CM | POA: Diagnosis not present

## 2020-04-18 DIAGNOSIS — R296 Repeated falls: Secondary | ICD-10-CM

## 2020-04-18 DIAGNOSIS — G4733 Obstructive sleep apnea (adult) (pediatric): Secondary | ICD-10-CM

## 2020-04-18 DIAGNOSIS — M109 Gout, unspecified: Secondary | ICD-10-CM

## 2020-04-18 DIAGNOSIS — I10 Essential (primary) hypertension: Secondary | ICD-10-CM

## 2020-04-18 MED ORDER — APIXABAN 5 MG PO TABS
5.0000 mg | ORAL_TABLET | Freq: Two times a day (BID) | ORAL | 1 refills | Status: DC
Start: 2020-04-18 — End: 2020-08-09

## 2020-04-18 NOTE — Assessment & Plan Note (Signed)
Chronic Tried stopped omeprazole - had GERD nightly Restart omeprazole 20 mg daily

## 2020-04-18 NOTE — Assessment & Plan Note (Signed)
Chronic Blood pressure here today acceptable Continue diltiazem 300 mg daily, doxazosin 8 mg daily, hydrochlorothiazide 25 mg daily

## 2020-04-18 NOTE — Assessment & Plan Note (Signed)
Chronic He denies depression, but does state decreased motivation Deferred referral to psychiatry Continue Cymbalta 60 mg twice daily

## 2020-04-18 NOTE — Assessment & Plan Note (Addendum)
Chronic Compliant with diabetic diet Taking metfomin 750 mg XR BID AC sugars 120-192 Encouraged increased activity Encourage diabetic diet Will recheck A1c in 3 months and adjust medications at that time if needed

## 2020-04-18 NOTE — Assessment & Plan Note (Signed)
Chronic We stopped the allopurinol at his last visit to see if that would improve his fatigue and it did not He denies gout symptoms At this point he will take colchicine as needed If he has attacks we will consider restarting allopurinol daily

## 2020-04-18 NOTE — Assessment & Plan Note (Signed)
Chronic No falls since his last visit No longer on any sleep medication, which sounds like the primary reason for his falls Has started physical therapy

## 2020-04-18 NOTE — Assessment & Plan Note (Signed)
Chronic Uses cpap with supplemental oxygen nightly

## 2020-04-19 ENCOUNTER — Ambulatory Visit: Payer: Medicare Other

## 2020-04-23 ENCOUNTER — Other Ambulatory Visit: Payer: Self-pay

## 2020-04-23 ENCOUNTER — Ambulatory Visit: Payer: Medicare Other

## 2020-04-23 DIAGNOSIS — R2689 Other abnormalities of gait and mobility: Secondary | ICD-10-CM

## 2020-04-23 DIAGNOSIS — M6281 Muscle weakness (generalized): Secondary | ICD-10-CM

## 2020-04-23 DIAGNOSIS — R2681 Unsteadiness on feet: Secondary | ICD-10-CM | POA: Diagnosis not present

## 2020-04-23 NOTE — Patient Instructions (Signed)
Access Code: [email or web address] URL: [email or web address] Date: 04/23/2020 Prepared by: Baldomero Lamy  Exercises Standing Balance with Eyes Closed on Pillow - 1 x daily - 5 x weekly - 1 sets - 3 reps - 30 hold Romberg Stance with Head Rotation on Pillow - 1 x daily - 5 x weekly - 2 sets - 10 reps Standing with Head Nod on Pillow - 1 x daily - 5 x weekly - 2 sets - 10 reps Standing Romberg to 1/2 Tandem Stance - 1 x daily - 5 x weekly - 1 sets - 3 reps - 30 hold Standing March with Counter Support - 1 x daily - 5 x weekly - 2 sets - 10 reps Standing Hip Abduction with Counter Support - 1 x daily - 5 x weekly - 2 sets - 10 reps Standing Hip Extension with Counter Support - 1 x daily - 5 x weekly - 2 sets - 10 reps

## 2020-04-23 NOTE — Therapy (Signed)
[facility] [address] [place], [name], [address] Phone: [phone]   Fax:  [phone]  Physical Therapy Treatment  Patient Details  Name: Jonathan [name] MRN: [id number] Date of Birth: 20-Apr-1933 Referring Provider (PT): Billey Gosling, MD   Encounter Date: 04/23/2020   PT End of Session - 04/23/20 0930    Visit Number 2    Number of Visits 13    Date for PT Re-Evaluation 06/11/20   POC for 6 weeks, Cert for 60 days   Authorization Type Medicare (10th Visit PN)    Progress Note Due on Visit 10    PT Start Time 0930    PT Stop Time 1014    PT Time Calculation (min) 44 min    Equipment Utilized During Treatment Gait belt    Activity Tolerance Patient tolerated treatment well    Behavior During Therapy WFL for tasks assessed/performed           Past Medical History:  Diagnosis Date  . Atrial fibrillation (HCC)    a. paroxysmal, on Eliquis for anticoagulation  . BPH (benign prostatic hyperplasia)   . Bronchitis   . Cataract   . COPD (chronic obstructive pulmonary disease) (HCC)    2 liters O2 HS  . Depression   . Fatty tumor    waste and back  . Fibromyalgia   . GERD (gastroesophageal reflux disease)   . Glaucoma    bilateral eyes  . Gout   . Hereditary and idiopathic peripheral neuropathy 08/28/2015  . Hypertension   . Hypothyroidism   . Hypoxia   . Insomnia   . Osteoarthritis   . Osteoporosis   . Peripheral neuropathy   . Sleep apnea    uses cpap-add oxygen at night  . Spinal compression fracture (HCC) seventh vertebre  . Transient alteration of awareness 08/28/2015  . Type 2 diabetes mellitus ([place]) 03/08/2016    Past Surgical History:  Procedure Laterality Date  . APPENDECTOMY  age 86  . CARPAL TUNNEL RELEASE Right    early 2000s  . CATARACT EXTRACTION     bilateral  . CHOLECYSTECTOMY  age 86  . EYE SURGERY    . FOOT ARTHRODESIS Right 02/02/2013   Procedure: RIGHT HALLUX METATARSAL  PHALANGEAL JOINT ARTHRODESIS ;  Surgeon: Wylene Simmer, MD;  Location: [facility];  Service: Orthopedics;  Laterality: Right;  . INGUINAL HERNIA REPAIR  age 86   rt side  . NASAL CONCHA BULLOSA RESECTION  age 86  . PROSTATE SURGERY    . SHOULDER ARTHROSCOPY W/ ROTATOR CUFF REPAIR Right    early 2000s  . tonsil    . VASECTOMY  age 86    There were no vitals filed for this visit.   Subjective Assessment - 04/23/20 0932    Subjective Patient reports no new changes/complaints since last visit. No falls since initial visit.    Pertinent History pAF on Eliquis, chronic pain on oxycodone, previous SDH, COPD on home O2 noct, HTN, hypothyroidism, OSA on CPAP, and DM, Neuropathy    Limitations Standing;Walking    Patient Stated Goals Improve the Balance; Improve Core Strength    Currently in Pain? No/denies    Pain Onset More than a month ago               [facility] Adult PT Treatment/Exercise - 04/23/20 1015      Ambulation/Gait   Ambulation/Gait Yes    Ambulation/Gait Assistance 5: Supervision    Ambulation/Gait Assistance Details  througout therapy gym with activities     Assistive device None    Gait Pattern Step-through pattern    Ambulation Surface Level;Indoor          Completed all of the following exercises and established initial HEP. Educated to complete balance exercises in a corner with chair placed in front for safety.   Access Code: [email or web address] URL: [email or web address] Date: 04/23/2020 Prepared by: Baldomero Lamy  Exercises Standing Balance with Eyes Closed on Pillow - 1 x daily - 5 x weekly - 1 sets - 3 reps - 30 hold Romberg Stance with Head Rotation on Pillow - 1 x daily - 5 x weekly - 2 sets - 10 reps Standing with Head Nod on Pillow - 1 x daily - 5 x weekly - 2 sets - 10 reps - increased balance challenge with up > down, requiring wide BOS for completion.  Standing Romberg to 1/2 Tandem Stance - 1 x daily - 5 x weekly - 1 sets - 3 reps  - 30 hold Standing March with Counter Support - 1 x daily - 5 x weekly - 2 sets - 10 reps - completed with red theraband Standing Hip Abduction with Counter Support - 1 x daily - 5 x weekly - 2 sets - 10 reps - verbal cues to maintain knee extension and for technique Standing Hip Extension with Counter Support - 1 x daily - 5 x weekly - 2 sets - 10 reps - verbal cues to maintain knee extension and for technique        PT Education - 04/23/20 1015    Education Details Educated on Initial HEP    Person(s) Educated Patient    Methods Explanation;Demonstration;Handout    Comprehension Verbalized understanding;Returned demonstration            PT Short Term Goals - 04/12/20 1302      PT SHORT TERM GOAL #1   Title Patient will be independent with balance/strengthening HEP (All STGS Due: 05/03/20)    Baseline no HEP established    Time 3    Period Weeks    Status New    Target Date 05/03/20      PT SHORT TERM GOAL #2   Title Patient improve FGA to >/= 19/30 to demonstrate improved balance and reduced fall risk    Baseline 17/30    Time 3    Period Weeks    Status New      PT SHORT TERM GOAL #3   Title Patient will be educated on fall prevention/strategies to further reduce fall risk    Baseline dependent    Time 3    Period Weeks    Status New             PT Long Term Goals - 04/12/20 1306      PT LONG TERM GOAL #1   Title Patient will be independent with with final balance/strengthening HEP (All LTGs Due: 05/24/20)    Baseline no HEP established    Time 6    Period Weeks    Status New    Target Date 05/24/20      PT LONG TERM GOAL #2   Title Patient will improve FGA to >/= 23/30 to demonstrate reduced fall risk.    Baseline 19/30    Time 6    Period Weeks    Status New      PT LONG TERM GOAL #3   Title Patient will demo ability to complete situation  3 for 30 seconds, and situation 4 at least 20 seconds.    Baseline 23 secs on Situation 3, <5 seconds on  Situation 4    Time 6    Period Weeks    Status New      PT LONG TERM GOAL #4   Title Patient will be able to complete 5x sit <> stand without UE support in </= 10 seconds to demonstrate improved balance    Baseline 12.1 secs    Time 6    Period Weeks    Status New      PT LONG TERM GOAL #5   Title Patient will improve TUG to </= 10 secs to demonstrate improved functional mobility and reduced fall risk    Baseline 12.16    Time 6    Period Weeks    Status New                 Plan - 04/23/20 1303    Clinical Impression Statement Today's skilled PT session included completing balance and strengthening exercises as tolerated by patient and establishing initial HEP. Patient demo increased balance challenge with vision removed and vertical head turns (up > down), requiring CGA from PT. Pt requring verbal cues to maintain knee extension with hip extension activity. Patient will conitnue to benefit from skilled PT services to progress toward all goals.    Personal Factors and Comorbidities Comorbidity 3+    Comorbidities pAF on Eliquis, chronic pain on oxycodone and Ambien, previous SDH, COPD on home O2 noct, HTN, hypothyroidism, OSA on CPAP, and DM    Examination-Activity Limitations Locomotion Level;Stairs    Examination-Participation Restrictions Community Activity;Yard Work    [employer] Evolving/Moderate complexity    Rehab Potential Good    PT Frequency 2x / week    PT Duration 6 weeks    PT Treatment/Interventions ADLs/Self Care Home Management;Electrical Stimulation;DME Instruction;Stair training;Functional mobility training;Gait training;Therapeutic activities;Therapeutic exercise;Balance training;Neuromuscular re-education;Patient/family education;Vestibular    PT Next Visit Plan How was HEP? continue balance exercsies (complaint surfaces), tandem walking. strengtening exercises    Consulted and Agree with Plan of Care Patient           Patient  will benefit from skilled therapeutic intervention in order to improve the following deficits and impairments:  Decreased balance, Decreased strength, Pain, Impaired sensation, Difficulty walking, Decreased endurance, Abnormal gait  Visit Diagnosis: Muscle weakness (generalized)  Other abnormalities of gait and mobility  Unsteadiness on feet     Problem List Patient Active Problem List   Diagnosis Date Noted  . Recurrent falls 03/25/2020  . SDH (subdural hematoma) ([place]) 03/21/2020  . Intracranial hemorrhage ([place]) 03/14/2020  . Fall at home, subsequent encounter 03/14/2020  . Leukocytosis 03/14/2020  . Nausea 09/13/2019  . Rotator cuff arthropathy of left shoulder 07/21/2019  . Dupuytren's disease of palm 07/12/2019  . Sebaceous cyst 06/27/2019  . COVID-19 05/08/2019  . Acute encephalopathy 05/08/2019  . Obstructive sleep apnea   . Asymmetrical left sensorineural hearing loss 07/12/2018  . Chronic anticoagulation 12/21/2017  . Compression fracture of thoracic vertebra ([place]) 09/21/2017  . Degeneration of thoracic intervertebral disc 09/21/2017  . Thoracic back pain 08/10/2017  . Degenerative joint disease of hand 07/14/2017  . Osteoarthritis   . Hypoxia   . Fibromyalgia   . Fatty tumor   . Cataract   . BPH (benign prostatic hyperplasia)   . Chronic atrial fibrillation ([place])   . Insomnia 03/10/2017  . Chronic pain, legs and back 03/10/2017  .  Hyperlipidemia 03/10/2017  . Obesity (BMI 30.0-34.9) 12/08/2016  . Depression 09/07/2016  . Spondylolisthesis 09/07/2016  . Hypothyroidism 03/08/2016  . GERD (gastroesophageal reflux disease) 03/08/2016  . Gout 03/08/2016  . Glaucoma 03/08/2016  . Type 2 diabetes mellitus ([place]) 03/08/2016  . Hereditary and idiopathic peripheral neuropathy 08/28/2015  . Essential hypertension 12/04/2011  . Allergic rhinitis, seasonal 12/04/2011  . COPD (chronic obstructive pulmonary disease) ([place]) 07/18/2011    Jones Bales, PT,  DPT 04/23/2020, 1:07 PM  [facility] [address] [place], [name], [address] Phone: [phone]   Fax:  [phone]  Name: Kiah Keay MRN: [id number] Date of Birth: 1932/07/10

## 2020-04-26 ENCOUNTER — Other Ambulatory Visit: Payer: Self-pay | Admitting: Cardiology

## 2020-04-26 ENCOUNTER — Ambulatory Visit (INDEPENDENT_AMBULATORY_CARE_PROVIDER_SITE_OTHER): Payer: Medicare Other

## 2020-04-26 ENCOUNTER — Other Ambulatory Visit: Payer: Self-pay | Admitting: Internal Medicine

## 2020-04-26 ENCOUNTER — Ambulatory Visit: Payer: Medicare Other

## 2020-04-26 ENCOUNTER — Other Ambulatory Visit: Payer: Self-pay

## 2020-04-26 VITALS — BP 130/70 | HR 63 | Temp 98.3°F | Ht 67.0 in | Wt 204.0 lb

## 2020-04-26 VITALS — BP 134/82

## 2020-04-26 DIAGNOSIS — Z0000 Encounter for general adult medical examination without abnormal findings: Secondary | ICD-10-CM | POA: Diagnosis not present

## 2020-04-26 DIAGNOSIS — M6281 Muscle weakness (generalized): Secondary | ICD-10-CM

## 2020-04-26 DIAGNOSIS — R2689 Other abnormalities of gait and mobility: Secondary | ICD-10-CM

## 2020-04-26 DIAGNOSIS — R2681 Unsteadiness on feet: Secondary | ICD-10-CM

## 2020-04-26 NOTE — Patient Instructions (Signed)
Jonathan [name] , Thank you for taking time to come for your Medicare Wellness Visit. I appreciate your ongoing commitment to your health goals. Please review the following plan we discussed and let me know if I can assist you in the future.   Screening recommendations/referrals: Colonoscopy: 01/02/2014; no repeat due to age Recommended yearly ophthalmology/optometry visit for glaucoma screening and checkup Recommended yearly dental visit for hygiene and checkup  Vaccinations: Influenza vaccine: 03/25/2020 Pneumococcal vaccine: up to date Tdap vaccine: 03/06/2018; due every 10 years Shingles vaccine: up to date   Covid-19: up to date   Advanced directives: Please bring a copy of your health care power of attorney and living will to the office at your convenience.  Conditions/risks identified: Yes; Reviewed health maintenance screenings with patient today and relevant education, vaccines, and/or referrals were provided. Please continue to do your personal lifestyle choices by: daily care of teeth and gums, regular physical activity (goal should be 5 days a week for 30 minutes), eat a healthy diet, avoid tobacco and drug use, limiting any alcohol intake, taking a low-dose aspirin (if not allergic or have been advised by your provider otherwise) and taking vitamins and minerals as recommended by your provider. Continue doing brain stimulating activities (puzzles, reading, adult coloring books, staying active) to keep memory sharp. Continue to eat heart healthy diet (full of fruits, vegetables, whole grains, lean protein, water--limit salt, fat, and sugar intake) and increase physical activity as tolerated.  Next appointment: Please schedule your next Medicare Wellness Visit with your Nurse Health Advisor in 1 year by calling [phone].   Preventive Care 84 Years and Older, Male Preventive care refers to lifestyle choices and visits with your health care provider that can promote health and  wellness. What does preventive care include?  A yearly physical exam. This is also called an annual well check.  Dental exams once or twice a year.  Routine eye exams. Ask your health care provider how often you should have your eyes checked.  Personal lifestyle choices, including:  Daily care of your teeth and gums.  Regular physical activity.  Eating a healthy diet.  Avoiding tobacco and drug use.  Limiting alcohol use.  Practicing safe sex.  Taking low doses of aspirin every day.  Taking vitamin and mineral supplements as recommended by your health care provider. What happens during an annual well check? The services and screenings done by your health care provider during your annual well check will depend on your age, overall health, lifestyle risk factors, and family history of disease. Counseling  Your health care provider may ask you questions about your:  Alcohol use.  Tobacco use.  Drug use.  Emotional well-being.  Home and relationship well-being.  Sexual activity.  Eating habits.  History of falls.  Memory and ability to understand (cognition).  Work and work Statistician. Screening  You may have the following tests or measurements:  Height, weight, and BMI.  Blood pressure.  Lipid and cholesterol levels. These may be checked every 5 years, or more frequently if you are over 10 years old.  Skin check.  Lung cancer screening. You may have this screening every year starting at age 50 if you have a 30-pack-year history of smoking and currently smoke or have quit within the past 15 years.  Fecal occult blood test (FOBT) of the stool. You may have this test every year starting at age 78.  Flexible sigmoidoscopy or colonoscopy. You may have a sigmoidoscopy every 5 years or a  colonoscopy every 10 years starting at age 81.  Prostate cancer screening. Recommendations will vary depending on your family history and other risks.  Hepatitis C blood  test.  Hepatitis B blood test.  Sexually transmitted disease (STD) testing.  Diabetes screening. This is done by checking your blood sugar (glucose) after you have not eaten for a while (fasting). You may have this done every 1-3 years.  Abdominal aortic aneurysm (AAA) screening. You may need this if you are a current or former smoker.  Osteoporosis. You may be screened starting at age 87 if you are at high risk. Talk with your health care provider about your test results, treatment options, and if necessary, the need for more tests. Vaccines  Your health care provider may recommend certain vaccines, such as:  Influenza vaccine. This is recommended every year.  Tetanus, diphtheria, and acellular pertussis (Tdap, Td) vaccine. You may need a Td booster every 10 years.  Zoster vaccine. You may need this after age 61.  Pneumococcal 13-valent conjugate (PCV13) vaccine. One dose is recommended after age 18.  Pneumococcal polysaccharide (PPSV23) vaccine. One dose is recommended after age 32. Talk to your health care provider about which screenings and vaccines you need and how often you need them. This information is not intended to replace advice given to you by your health care provider. Make sure you discuss any questions you have with your health care provider. Document Released: 06/28/2015 Document Revised: 02/19/2016 Document Reviewed: 04/02/2015 Elsevier Interactive Patient Education  2017 [place] Prevention in the Home Falls can cause injuries. They can happen to people of all ages. There are many things you can do to make your home safe and to help prevent falls. What can I do on the outside of my home?  Regularly fix the edges of walkways and driveways and fix any cracks.  Remove anything that might make you trip as you walk through a door, such as a raised step or threshold.  Trim any bushes or trees on the path to your home.  Use bright outdoor  lighting.  Clear any walking paths of anything that might make someone trip, such as rocks or tools.  Regularly check to see if handrails are loose or broken. Make sure that both sides of any steps have handrails.  Any raised decks and porches should have guardrails on the edges.  Have any leaves, snow, or ice cleared regularly.  Use sand or salt on walking paths during winter.  Clean up any spills in your garage right away. This includes oil or grease spills. What can I do in the bathroom?  Use night lights.  Install grab bars by the toilet and in the tub and shower. Do not use towel bars as grab bars.  Use non-skid mats or decals in the tub or shower.  If you need to sit down in the shower, use a plastic, non-slip stool.  Keep the floor dry. Clean up any water that spills on the floor as soon as it happens.  Remove soap buildup in the tub or shower regularly.  Attach bath mats securely with double-sided non-slip rug tape.  Do not have throw rugs and other things on the floor that can make you trip. What can I do in the bedroom?  Use night lights.  Make sure that you have a light by your bed that is easy to reach.  Do not use any sheets or blankets that are too big for your bed. They  should not hang down onto the floor.  Have a firm chair that has side arms. You can use this for support while you get dressed.  Do not have throw rugs and other things on the floor that can make you trip. What can I do in the kitchen?  Clean up any spills right away.  Avoid walking on wet floors.  Keep items that you use a lot in easy-to-reach places.  If you need to reach something above you, use a strong step stool that has a grab bar.  Keep electrical cords out of the way.  Do not use floor polish or wax that makes floors slippery. If you must use wax, use non-skid floor wax.  Do not have throw rugs and other things on the floor that can make you trip. What can I do with my  stairs?  Do not leave any items on the stairs.  Make sure that there are handrails on both sides of the stairs and use them. Fix handrails that are broken or loose. Make sure that handrails are as long as the stairways.  Check any carpeting to make sure that it is firmly attached to the stairs. Fix any carpet that is loose or worn.  Avoid having throw rugs at the top or bottom of the stairs. If you do have throw rugs, attach them to the floor with carpet tape.  Make sure that you have a light switch at the top of the stairs and the bottom of the stairs. If you do not have them, ask someone to add them for you. What else can I do to help prevent falls?  Wear shoes that:  Do not have high heels.  Have rubber bottoms.  Are comfortable and fit you well.  Are closed at the toe. Do not wear sandals.  If you use a stepladder:  Make sure that it is fully opened. Do not climb a closed stepladder.  Make sure that both sides of the stepladder are locked into place.  Ask someone to hold it for you, if possible.  Clearly mark and make sure that you can see:  Any grab bars or handrails.  First and last steps.  Where the edge of each step is.  Use tools that help you move around (mobility aids) if they are needed. These include:  Canes.  Walkers.  Scooters.  Crutches.  Turn on the lights when you go into a dark area. Replace any light bulbs as soon as they burn out.  Set up your furniture so you have a clear path. Avoid moving your furniture around.  If any of your floors are uneven, fix them.  If there are any pets around you, be aware of where they are.  Review your medicines with your doctor. Some medicines can make you feel dizzy. This can increase your chance of falling. Ask your doctor what other things that you can do to help prevent falls. This information is not intended to replace advice given to you by your health care provider. Make sure you discuss any  questions you have with your health care provider. Document Released: 03/28/2009 Document Revised: 11/07/2015 Document Reviewed: 07/06/2014 Elsevier Interactive Patient Education  2017 [employer].

## 2020-04-26 NOTE — Therapy (Signed)
[facility] [address] [place], [name], [address] Phone: [phone]   Fax:  [phone]  Physical Therapy Treatment  Patient Details  Name: Jonathan [name] MRN: [id number] Date of Birth: [birth year] Referring Provider (PT): Billey Gosling, MD   Encounter Date: 04/26/2020   PT End of Session - 04/26/20 1405    Visit Number 3    Number of Visits 13    Date for PT Re-Evaluation 06/11/20   POC for 6 weeks, Cert for 60 days   Authorization Type Medicare (10th Visit PN)    Progress Note Due on Visit 10    PT Start Time 1402    PT Stop Time 1445    PT Time Calculation (min) 43 min    Equipment Utilized During Treatment Gait belt    Activity Tolerance Patient tolerated treatment well    Behavior During Therapy WFL for tasks assessed/performed           Past Medical History:  Diagnosis Date  . Atrial fibrillation (HCC)    a. paroxysmal, on Eliquis for anticoagulation  . BPH (benign prostatic hyperplasia)   . Bronchitis   . Cataract   . COPD (chronic obstructive pulmonary disease) (HCC)    2 liters O2 HS  . Depression   . Fatty tumor    waste and back  . Fibromyalgia   . GERD (gastroesophageal reflux disease)   . Glaucoma    bilateral eyes  . Gout   . Hereditary and idiopathic peripheral neuropathy 08/28/2015  . Hypertension   . Hypothyroidism   . Hypoxia   . Insomnia   . Osteoarthritis   . Osteoporosis   . Peripheral neuropathy   . Sleep apnea    uses cpap-add oxygen at night  . Spinal compression fracture (HCC) seventh vertebre  . Transient alteration of awareness 08/28/2015  . Type 2 diabetes mellitus ([place]) 03/08/2016    Past Surgical History:  Procedure Laterality Date  . APPENDECTOMY  age 12  . CARPAL TUNNEL RELEASE Right    early 2000s  . CATARACT EXTRACTION     bilateral  . CHOLECYSTECTOMY  age 42  . EYE SURGERY    . FOOT ARTHRODESIS Right 02/02/2013   Procedure: RIGHT HALLUX METATARSAL  PHALANGEAL JOINT ARTHRODESIS ;  Surgeon: Wylene Simmer, MD;  Location: [facility];  Service: Orthopedics;  Laterality: Right;  . INGUINAL HERNIA REPAIR  age 23   rt side  . NASAL CONCHA BULLOSA RESECTION  age 20  . PROSTATE SURGERY    . SHOULDER ARTHROSCOPY W/ ROTATOR CUFF REPAIR Right    early 2000s  . tonsil    . VASECTOMY  age 82    Vitals:   04/26/20 1410  BP: 134/82     Subjective Assessment - 04/26/20 1405    Subjective Patient reports that he had unusual exerpience of having difficulty remembering where clinic was located. This has not happened prior. No other new signs/symptoms reported. No falls to report.    Pertinent History pAF on Eliquis, chronic pain on oxycodone, previous SDH, COPD on home O2 noct, HTN, hypothyroidism, OSA on CPAP, and DM, Neuropathy    Limitations Standing;Walking    Patient Stated Goals Improve the Balance; Improve Core Strength    Currently in Pain? Yes    Pain Location Back    Pain Orientation Mid    Pain Descriptors / Indicators Constant;Aching    Pain Type Chronic pain    Pain Onset More than a month  ago                   [facility] Adult PT Treatment/Exercise - 04/26/20 0001      Ambulation/Gait   Ambulation/Gait Yes    Ambulation/Gait Assistance 5: Supervision    Ambulation/Gait Assistance Details throughout therapy gym without activities    Ambulation Distance (Feet) --   clinic distances   Assistive device None    Gait Pattern Step-through pattern    Ambulation Surface Level;Indoor      Exercises   Exercises Other Exercises;Knee/Hip    Other Exercises  Completed review of standing marching with red resistnace band, compelted x 15 reps. Verbal cues for placement of theraband.       Knee/Hip Exercises: Aerobic   Other Aerobic Completed SciFit level 2.5 with BUE/BLE x 5 minutes for improved strengthening and endurance training. Patient overall tolerating well.       Knee/Hip Exercises: Standing   Heel Raises  Both;2 sets;10 reps;3 seconds;Limitations    Heel Raises Limitations compelted with BUE support, verbal cues increased use of BLE > UE support               Balance Exercises - 04/26/20 0001      Balance Exercises: Standing   Standing Eyes Opened Narrow base of support (BOS);Head turns;Foam/compliant surface;Limitations    Standing Eyes Opened Limitations completed horizontal/vertical head turns 1 x 10 reps on airex, intermittent UE support    Standing Eyes Closed Wide (BOA);3 reps;30 secs;Limitations;Foam/compliant surface;Head turns;Solid surface    Standing Eyes Closed Limitations completed standing with wide BOS on foam, completed EC 3 x 30 secs. Standing on firm surface, completed with wide BOS completed horizontal head turns with EC 1 x 15 reps    Tandem Stance Eyes open;Intermittent upper extremity support;3 reps;30 secs;Limitations    Tandem Stance Time 30 secs    Stepping Strategy Anterior;Foam/compliant surface;UE support;Limitations    Stepping Strategy Limitations completed with intermittent UE support. verbal cues required for sequencing initially.     Gait with Head Turns Forward;Limitations    Gait with Head Turns Limitations completed forward ambulation 2 x 50' with horizontal and vertical head turns, intermittent CGA required due to increased sway               PT Short Term Goals - 04/12/20 1302      PT SHORT TERM GOAL #1   Title Patient will be independent with balance/strengthening HEP (All STGS Due: 05/03/20)    Baseline no HEP established    Time 3    Period Weeks    Status New    Target Date 05/03/20      PT SHORT TERM GOAL #2   Title Patient improve FGA to >/= 19/30 to demonstrate improved balance and reduced fall risk    Baseline 17/30    Time 3    Period Weeks    Status New      PT SHORT TERM GOAL #3   Title Patient will be educated on fall prevention/strategies to further reduce fall risk    Baseline dependent    Time 3    Period Weeks      Status New             PT Long Term Goals - 04/12/20 1306      PT LONG TERM GOAL #1   Title Patient will be independent with with final balance/strengthening HEP (All LTGs Due: 05/24/20)    Baseline no HEP established  Time 6    Period Weeks    Status New    Target Date 05/24/20      PT LONG TERM GOAL #2   Title Patient will improve FGA to >/= 23/30 to demonstrate reduced fall risk.    Baseline 19/30    Time 6    Period Weeks    Status New      PT LONG TERM GOAL #3   Title Patient will demo ability to complete situation 3 for 30 seconds, and situation 4 at least 20 seconds.    Baseline 23 secs on Situation 3, <5 seconds on Situation 4    Time 6    Period Weeks    Status New      PT LONG TERM GOAL #4   Title Patient will be able to complete 5x sit <> stand without UE support in </= 10 seconds to demonstrate improved balance    Baseline 12.1 secs    Time 6    Period Weeks    Status New      PT LONG TERM GOAL #5   Title Patient will improve TUG to </= 10 secs to demonstrate improved functional mobility and reduced fall risk    Baseline 12.16    Time 6    Period Weeks    Status New                 Plan - 04/26/20 1537    Clinical Impression Statement Continued progression of balance exercises and strengthening exercises with patient tolerating. Emphasis on complaint surfaces and vision removed due to increased difficulty. Patient requiring intermittent CGA throughout session. Completed endurance training on SciFit with patient tolerating well. Will continue per POC.    Personal Factors and Comorbidities Comorbidity 3+    Comorbidities pAF on Eliquis, chronic pain on oxycodone and Ambien, previous SDH, COPD on home O2 noct, HTN, hypothyroidism, OSA on CPAP, and DM    Examination-Activity Limitations Locomotion Level;Stairs    Examination-Participation Restrictions Community Activity;Yard Work    Merchant [employer] officer Evolving/Moderate  complexity    Rehab Potential Good    PT Frequency 2x / week    PT Duration 6 weeks    PT Treatment/Interventions ADLs/Self Care Home Management;Electrical Stimulation;DME Instruction;Stair training;Functional mobility training;Gait training;Therapeutic activities;Therapeutic exercise;Balance training;Neuromuscular re-education;Patient/family education;Vestibular    PT Next Visit Plan continue balance exercsies (complaint surfaces), tandem walking. strengtening exercises. SciFit    Consulted and Agree with Plan of Care Patient           Patient will benefit from skilled therapeutic intervention in order to improve the following deficits and impairments:  Decreased balance, Decreased strength, Pain, Impaired sensation, Difficulty walking, Decreased endurance, Abnormal gait  Visit Diagnosis: Muscle weakness (generalized)  Other abnormalities of gait and mobility  Unsteadiness on feet     Problem List Patient Active Problem List   Diagnosis Date Noted  . Recurrent falls 03/25/2020  . SDH (subdural hematoma) ([place]) 03/21/2020  . Intracranial hemorrhage ([place]) 03/14/2020  . Fall at home, subsequent encounter 03/14/2020  . Leukocytosis 03/14/2020  . Nausea 09/13/2019  . Rotator cuff arthropathy of left shoulder 07/21/2019  . Dupuytren's disease of palm 07/12/2019  . Sebaceous cyst 06/27/2019  . COVID-19 05/08/2019  . Acute encephalopathy 05/08/2019  . Obstructive sleep apnea   . Asymmetrical left sensorineural hearing loss 07/12/2018  . Chronic anticoagulation 12/21/2017  . Compression fracture of thoracic vertebra ([place]) 09/21/2017  . Degeneration of thoracic intervertebral disc 09/21/2017  . Thoracic back  pain 08/10/2017  . Degenerative joint disease of hand 07/14/2017  . Osteoarthritis   . Hypoxia   . Fibromyalgia   . Fatty tumor   . Cataract   . BPH (benign prostatic hyperplasia)   . Chronic atrial fibrillation ([place])   . Insomnia 03/10/2017  . Chronic pain, legs and  back 03/10/2017  . Hyperlipidemia 03/10/2017  . Obesity (BMI 30.0-34.9) 12/08/2016  . Depression 09/07/2016  . Spondylolisthesis 09/07/2016  . Hypothyroidism 03/08/2016  . GERD (gastroesophageal reflux disease) 03/08/2016  . Gout 03/08/2016  . Glaucoma 03/08/2016  . Type 2 diabetes mellitus ([place]) 03/08/2016  . Hereditary and idiopathic peripheral neuropathy 08/28/2015  . Essential hypertension 12/04/2011  . Allergic rhinitis, seasonal 12/04/2011  . COPD (chronic obstructive pulmonary disease) ([place]) 07/18/2011    Jones Bales, PT, DPT 04/26/2020, 3:45 PM  [facility] [address] [place], [name], [address] Phone: [phone]   Fax:  [phone]  Name: Sukhdeep Wieting MRN: [id number] Date of Birth: February 24, 1933

## 2020-04-26 NOTE — Progress Notes (Signed)
Subjective:   Kaylub Detienne is a 86 y.o. male who presents for Medicare Annual/Subsequent preventive examination.  Review of Systems    No ROS. Medicare Wellness Visit. Additional risk factors are reflected in social history. Cardiac Risk Factors include: advanced age (>20mn, >>50women);diabetes mellitus;hypertension;family history of premature cardiovascular disease;male gender;obesity (BMI >30kg/m2) Sleep Patterns: Yes to sleep issues, feels rested on waking and sleeps 8 hours nightly; gets up to void 3-5 times nightly. Home Safety/Smoke Alarms: Feels safe in home; uses home alarm. Smoke alarms in place. Living environment: 2-story home; Lives with spouse; no needs for DME; good support system. Seat Belt Safety/Bike Helmet: Wears seat belt.    Objective:    Today's Vitals   04/26/20 0908  BP: 130/70  Pulse: 63  Temp: 98.3 F (36.8 C)  SpO2: 95%  Weight: 204 lb (92.5 kg)  Height: 5' 7" (1.702 m)  PainSc: 3    Body mass index is 31.95 kg/m.  Advanced Directives 04/12/2020 03/14/2020 05/10/2019 05/07/2019 04/26/2019 04/20/2018 03/07/2018  Does Patient Have a Medical Advance Directive? _0  Yes Yes  Type of [place]amed[place]of [place] - [place] Living will  Does patient want to make changes to medical advance directive? No - Patient declined No - Patient declined No - Patient declined - - - No - Patient declined  Copy of [place] Chart? - - No - copy requested - No - copy requested No - copy requested -  Would patient like information on creating a medical advance directive? - - - - - - No - Patient declined  Pre-existing out of facility DNR order (yellow form or pink MOST form) - - - - - - -    Current Medications (verified) Outpatient Encounter Medications as of 04/26/2020  Medication Sig  .  acetaminophen (TYLENOL) 650 MG CR tablet Take 650 mg by mouth in the morning and at bedtime. Pt takes 2 tabs 2 times daily  . apixaban (ELIQUIS) 5 MG TABS tablet Take 1 tablet (5 mg total) by mouth 2 (two) times daily.  .Marland Kitchenatorvastatin (LIPITOR) 20 MG tablet TAKE 1 TABLET BY MOUTH EVERY DAY (Patient taking differently: Take 20 mg by mouth daily. )  . bimatoprost (LUMIGAN) 0.01 % SOLN Place 1 drop into both eyes at bedtime.   . Biotin 5000 MCG TABS Take 5,000 mcg by mouth daily.  . blood glucose meter kit and supplies KIT Dispense based on patient and insurance preference. Use daily as directed. (FOR E11.9).  . calcium-vitamin D (OSCAL WITH D) 500-200 MG-UNIT tablet Take 1 tablet by mouth daily with breakfast.  . Capsaicin 0.1 % CREA Apply topically.  . colchicine (COLCRYS) 0.6 MG tablet Take 0.6 mg by mouth daily as needed (flareups).   . diltiazem (CARDIZEM CD) 300 MG 24 hr capsule TAKE 1 CAPSULE BY MOUTH  DAILY (Patient taking differently: Take 300 mg by mouth daily. )  . diltiazem (CARDIZEM) 30 MG tablet Take 1 tablet (30 mg total) by mouth 2 (two) times daily as needed (sustained elevated heart rates >100 bpm for > 20 minutes.).  .Marland Kitchendoxazosin (CARDURA) 8 MG tablet TAKE 1 TABLET BY MOUTH EVERY DAY  . DULoxetine (CYMBALTA) 60 MG capsule Take 1 capsule (60 mg total) by mouth 2 (two) times daily.  .Marland Kitchenglucose blood test strip Use to check blood sugar daily. E11.9  .  hydrochlorothiazide (HYDRODIURIL) 25 MG tablet TAKE 1 TABLET BY MOUTH EVERY DAY  . KLOR-CON M20 20 MEQ tablet TAKE 1 TABLET BY MOUTH EVERY DAY (Patient taking differently: Take 20 mEq by mouth daily. )  . Lancets MISC Use to check blood sugars daily. E11.9  . levothyroxine (SYNTHROID) 112 MCG tablet Take 1 tablet (112 mcg total) by mouth daily before breakfast.  . magnesium oxide (MAG-OX) 400 MG tablet Take 400 mg by mouth daily.  . Melatonin 5 MG TABS Take 5 mg by mouth at bedtime.  . metFORMIN (GLUCOPHAGE-XR) 750 MG 24 hr tablet Take 1  tablet (750 mg total) by mouth in the morning and at bedtime.  . mometasone (NASONEX) 50 MCG/ACT nasal spray Place 2 sprays into the nose daily. (Patient taking differently: Place 2 sprays into the nose as needed (allergies). )  . Multiple Vitamin (MULTIVITAMIN) tablet Take 1 tablet by mouth daily.  . nitroGLYCERIN (NITROSTAT) 0.4 MG SL tablet Place 1 tablet (0.4 mg total) under the tongue every 5 (five) minutes as needed for chest pain.  Marland Kitchen omeprazole (PRILOSEC) 20 MG capsule TAKE 1 CAPSULE BY MOUTH EVERY DAY  . RESTASIS 0.05 % ophthalmic emulsion Place 1 drop into both eyes 2 (two) times daily.   . sildenafil (REVATIO) 20 MG tablet Take 40-100 mg by mouth daily as needed.  . solifenacin (VESICARE) 10 MG tablet Take 10 mg by mouth daily.  . vitamin C (ASCORBIC ACID) 500 MG tablet Take 500 mg by mouth 2 (two) times daily.   Marland Kitchen VITAMIN D, CHOLECALCIFEROL, PO Take 25 mcg by mouth daily.    No facility-administered encounter medications on file as of 04/26/2020.    Allergies (verified) Other   History: Past Medical History:  Diagnosis Date  . Atrial fibrillation (HCC)    a. paroxysmal, on Eliquis for anticoagulation  . BPH (benign prostatic hyperplasia)   . Bronchitis   . Cataract   . COPD (chronic obstructive pulmonary disease) (HCC)    2 liters O2 HS  . Depression   . Fatty tumor    waste and back  . Fibromyalgia   . GERD (gastroesophageal reflux disease)   . Glaucoma    bilateral eyes  . Gout   . Hereditary and idiopathic peripheral neuropathy 08/28/2015  . Hypertension   . Hypothyroidism   . Hypoxia   . Insomnia   . Osteoarthritis   . Osteoporosis   . Peripheral neuropathy   . Sleep apnea    uses cpap-add oxygen at night  . Spinal compression fracture (HCC) seventh vertebre  . Transient alteration of awareness 08/28/2015  . Type 2 diabetes mellitus ([place]) 03/08/2016   Past Surgical History:  Procedure Laterality Date  . APPENDECTOMY  age 57  . CARPAL TUNNEL RELEASE  Right    early 2000s  . CATARACT EXTRACTION     bilateral  . CHOLECYSTECTOMY  age 82  . EYE SURGERY    . FOOT ARTHRODESIS Right 02/02/2013   Procedure: RIGHT HALLUX METATARSAL PHALANGEAL JOINT ARTHRODESIS ;  Surgeon: Wylene Simmer, MD;  Location: [facility];  Service: Orthopedics;  Laterality: Right;  . INGUINAL HERNIA REPAIR  age 26   rt side  . NASAL CONCHA BULLOSA RESECTION  age 33  . PROSTATE SURGERY    . SHOULDER ARTHROSCOPY W/ ROTATOR CUFF REPAIR Right    early 2000s  . tonsil    . VASECTOMY  age 26   Family History  Problem Relation Age of Onset  . Coronary artery  disease Brother   . Heart disease Father   . Lung cancer Father   . Kidney cancer Father   . Prostate cancer Father   . Arthritis Mother   . Lung cancer Mother   . Dementia Mother        Unspecified type, not Alzheimer's disease   Social History   Socioeconomic History  . Marital status: Married    Spouse name: Not on file  . Number of children: 2  . Years of education: 73  . Highest education level: Master's degree (e.g., MA, MS, MEng, MEd, MSW, MBA)  Occupational History  . Occupation: RETIRED    Employer: RETIRED  Tobacco Use  . Smoking status: Former Smoker    Packs/day: 2.00    Years: 10.00    Pack years: 20.00    Types: Cigarettes    Quit date: 07/18/1975    Years since quitting: 44.8  . Smokeless tobacco: Never Used  Vaping Use  . Vaping Use: Never used  Substance and Sexual Activity  . Alcohol use: Yes    Comment: 1-2 drinks/day  . Drug use: No  . Sexual activity: Not Currently  Other Topics Concern  . Not on file  Social History Narrative  . Not on file   Social Determinants of Health   Financial Resource Strain: Low Risk   . Difficulty of Paying Living Expenses: Not hard at all  Food Insecurity: No Food Insecurity  . Worried About Charity fundraiser in the Last Year: Never true  . Ran Out of Food in the Last Year: Never true  Transportation Needs: No  Transportation Needs  . Lack of Transportation (Medical): No  . Lack of Transportation (Non-Medical): No  Physical Activity: Inactive  . Days of Exercise per Week: 0 days  . Minutes of Exercise per Session: 0 min  Stress: No Stress Concern Present  . Feeling of Stress : Not at all  Social Connections: Socially Integrated  . Frequency of Communication with Friends and Family: More than three times a week  . Frequency of Social Gatherings with Friends and Family: Once a week  . Attends Religious Services: 1 to 4 times per year  . Active Member of Clubs or Organizations: Yes  . Attends Archivist Meetings: 1 to 4 times per year  . Marital Status: Married    Tobacco Counseling Counseling given: Not Answered   Clinical Intake:  Pre-visit preparation completed: Yes  Pain : 0-10 Pain Score: 3  Pain Type: Chronic pain Pain Location: Back Pain Orientation: Mid Pain Descriptors / Indicators: Constant, Discomfort Pain Onset: More than a month ago Pain Frequency: Constant Pain Relieving Factors: Pain medication Effect of Pain on Daily Activities: Pain produces disability and affects the quality of life.  Pain Relieving Factors: Pain medication  BMI - recorded: 31.95 Nutritional Status: BMI > 30  Obese Nutritional Risks: None Diabetes: Yes CBG done?: No Did pt. bring in CBG monitor from home?: No  How often do you need to have someone help you when you read instructions, pamphlets, or other written materials from your doctor or pharmacy?: 1 - Never What is the last grade level you completed in school?: Master's Degree  Diabetic? yes  Interpreter Needed?: No  Information entered by :: Lisette Abu, LPN   Activities of Daily Living In your present state of health, do you have any difficulty performing the following activities: 04/26/2020 05/10/2019  Hearing? N -  Comment wears hearing aids -  Vision? N -  Difficulty concentrating or making decisions? Y -    Walking or climbing stairs? N N  Dressing or bathing? N -  Doing errands, shopping? N -  Preparing Food and eating ? N -  Using the Toilet? N -  In the past six months, have you accidently leaked urine? N -  Do you have problems with loss of bowel control? N -  Managing your Medications? N -  Managing your Finances? N -  Housekeeping or managing your Housekeeping? N -  Some recent data might be hidden    Patient Care Team: Binnie Rail, MD as PCP - General (Internal Medicine) Stanford Breed Denice Bors, MD as PCP - Cardiology (Cardiology) Council Mechanic, MD as Referring Physician (Family Medicine) Earnie Larsson, MD as Consulting Physician (Neurosurgery) Lelon Perla, MD as Consulting Physician (Cardiology) Michael Boston, MD as Consulting Physician (General Surgery) Karen Kays, NP as Nurse Practitioner (Nurse Practitioner) [name], [facility] as Pharmacist (Pharmacist)  Indicate any recent Medical Services you may have received from other than [facility] providers in the past year (date may be approximate).     Assessment:   This is a routine wellness examination for [place].  Hearing/Vision screen No exam data present  Dietary issues and exercise activities discussed: Current Exercise Habits: The patient does not participate in regular exercise at present, Exercise limited by: cardiac condition(s);respiratory conditions(s)  Goals    . Cut out extra servings     I am working to keep my blood sugar down by limiting keeping ice cream and sweets in the house.    Marland Kitchen Pharmacy Care Plan     CARE PLAN ENTRY (see longitudinal plan of care for additional care plan information)  Current Barriers:  . Chronic Disease Management support, education, and care coordination needs related to Hypertension, Hyperlipidemia, Diabetes, GERD, Overactive Bladder, Gout, and Glaucoma   Hypertension BP Readings from Last 3 Encounters:  01/30/20 132/83  09/13/19 (!) 150/72  08/14/19 140/72  .  Pharmacist Clinical Goal(s): o Over the next 90 days, patient will work with PharmD and providers to maintain BP goal <140/90 . Current regimen:  o HCTZ 25 mg daily o Doxazosin 8 mg daily o Diltiazem 300 mg daily . Interventions: o Discussed BP goals and benefits of medications for prevention of heart attack / stroke . Patient self care activities - Over the next 90 days, patient will: o Check BP daily, document, and provide at future appointments o Ensure daily salt intake < 2300 mg/day  Hyperlipidemia Lab Results  Component Value Date/Time   LDLCALC 66 11/11/2018 11:14 AM   LDLDIRECT 51.0 08/14/2019 10:28 AM .  Pharmacist Clinical Goal(s): o Over the next 90 days, patient will work with PharmD and providers to maintain LDL goal < 100 . Current regimen:  o Atorvastatin 20 mg daily o Nitroglycerin 0.4 mg SL prn . Interventions: o Discussed cholesterol goals and benefits of medications for prevention of heart attack / stroke . Patient self care activities - Over the next 90 days, patient will: o Continue current medications  Diabetes Lab Results  Component Value Date/Time   HGBA1C 8.0 (H) 08/14/2019 10:28 AM   HGBA1C 7.4 (H) 02/09/2019 10:36 AM .  Pharmacist Clinical Goal(s): o Over the next 90 days, patient will work with PharmD and providers to achieve A1c goal <8% . Current regimen:  o Metformin ER 750 mg daily o Testing supplies . Interventions: o Discussed blood sugar goals and benefits of medications for prevention of diabetic  complications o Recommended to increase metformin to twice a day (as suggested by PCP earlier this year) . Patient self care activities - Over the next 90 days, patient will: o Check blood sugar before breakfast, document, and provide at future appointments o Contact provider with any episodes of hypoglycemia  GERD . Pharmacist Clinical Goal(s) o Over the next 90 days, patient will work with PharmD and providers to optimize  therapy . Current regimen:  o Omeprazole 20 mg daily . Interventions: o Discussed ability to taper omeprazole and see if GERD can be controlled without it; advised to reduce dose to every other day for 1 week, then stop . Patient self care activities - Over the next 90 days, patient will: o Taper omeprazole as above and monitor reflux symptoms; if symptoms return may resume omeprazole at any time  Gout . Pharmacist Clinical Goal(s) o Over the next 90 days, patient will work with PharmD and providers to optimize therapy . Current regimen:  o Allopurinol 300 mg daily o Colchicine 0.6 mg as needed for flares . Interventions: o Benefits of allopurinol for prevention of gout flares; pt denies gout issues for many years o Discussed low purine diet - handout provided o Discussed ability for trial period off of allopurinol and monitor for symptoms of gout . Patient self care activities - Over the next 90 days, patient will: o Consider holding allopurinol and modifying diet to avoid purines  Overactive bladder . Pharmacist Clinical Goal(s) o Over the next 90 days, patient will work with PharmD and providers to optimize therapy . Current regimen:  o Solifenancin 5 mg daily (not taking) . Interventions: o Discussed benefits of solifenacin; pt had confused it with sildenafil so was not taking; advised to take at bedtime to help with nocturia . Patient self care activities - Over the next 90 days, patient will: o Take solifenacin 5 mg daily at bedtime to help with nighttime urination  Glaucoma . Pharmacist Clinical Goal(s) o Over the next 90 days, patient will work with PharmD and providers to optimize therapy . Current regimen:  o Dorzolamide-timolol 22.3-6.8 mg/ml eye drops o Bimatoprost (Lumigan) 0.01% eye drops . Interventions: o Discussed insurance formulary may prefer alternatives; consulted formulary and latanoprost is preferred ($0 copay). Contacted eye doctor to potentially switch  to more affordable option . Patient self care activities - Over the next 90 days, patient will: o Continue medications as prescribed  Medication management . Pharmacist Clinical Goal(s): o Over the next 90 days, patient will work with PharmD and providers to maintain optimal medication adherence . Current pharmacy: [facility] . Interventions o Comprehensive medication review performed. o Continue current medication management strategy o Advised to consult with Medicare expert to choose an optimal insurance plan for 2022 . Patient self care activities - Over the next 90 days, patient will: o Focus on medication adherence by fill date o Take medications as prescribed o Report any questions or concerns to PharmD and/or provider(s) o Contact [facility] [phone]) to set up a meeting with a Medicare advisor  Initial goal documentation      Depression Screen PHQ 2/9 Scores 04/26/2020 08/14/2019 04/26/2019 04/20/2018 04/19/2017 01/14/2017 01/12/2017  PHQ - 2 Score 0 _0 0 0  PHQ- 9 Score - _1 - -    Fall Risk Fall Risk  04/26/2020 05/10/2019 04/26/2019 04/26/2019 04/20/2018  Falls in the past year? 1 1 0 0 1  Comment - Emmi Telephone Survey: data to providers prior  to load - - -  Number falls in past yr: 0 1 0 0 0  Comment - Emmi Telephone Survey Actual Response = 2 - - -  Injury with Fall? 1 1 0 0 1  Risk for fall due to : Impaired balance/gait - Impaired balance/gait - Impaired balance/gait  Follow up Falls evaluation completed;Education provided - - - Falls prevention discussed    Any stairs in or around the home? Yes  If so, are there any without handrails? No  Home free of loose throw rugs in walkways, pet beds, electrical cords, etc? Yes  Adequate lighting in your home to reduce risk of falls? Yes   ASSISTIVE DEVICES UTILIZED TO PREVENT FALLS:  Life alert? No  Use of a cane, walker or w/c? No   Grab bars in the bathroom? Yes  Shower chair or bench in shower? Yes  Elevated  toilet seat or a handicapped toilet? Yes   TIMED UP AND GO:  Was the test performed? No .  Length of time to ambulate 10 feet: 0 sec.   Gait steady and fast without use of assistive device  Cognitive Function: MMSE - Mini Mental State Exam 04/26/2019 04/20/2018 04/19/2017 08/28/2015  Not completed: Refused - Refused -  Orientation to time - 5 - 5  Orientation to Place - 5 - 5  Registration - 3 - 3  Attention/ Calculation - 5 - 5  Recall - 2 - 2  Language- name 2 objects - 2 - 2  Language- repeat - 1 - 1  Language- follow 3 step command - 3 - 3  Language- read & follow direction - 1 - 1  Write a sentence - 1 - 1  Copy design - 1 - 1  Total score - 29 - 29     6CIT Screen 04/26/2020  What Year? 0 points  What month? 0 points  What time? 0 points  Count back from 20 0 points  Months in reverse 0 points  Repeat phrase 0 points  Total Score 0    Immunizations Immunization History  Administered Date(s) Administered  . Fluad Quad(high Dose 65+) 03/25/2020  . Hepatitis A 07/25/2007, 01/22/2008  . Hepatitis B 04/30/1988, 06/02/1988, 10/28/1988  . IPV 05/15/1996  . Influenza Split 02/19/2012, 03/13/2016  . Influenza Whole 02/17/2011, 03/13/2013  . Influenza, High Dose Seasonal PF 03/10/2017, 02/15/2018, 02/09/2019  . Influenza,inj,Quad PF,6+ Mos 03/15/2015  . Influenza,inj,quad, With Preservative 02/14/2019  . Meningococcal Polysaccharide 05/15/1996  . Moderna SARS-COVID-2 Vaccination 07/15/2019, 08/12/2019, 02/15/2020  . Pneumococcal Conjugate-13 12/08/2016  . Pneumococcal-Unspecified 02/01/2006  . Td 01/14/1995  . Tdap 03/06/2018  . Zoster 02/01/2006  . Zoster Recombinat (Shingrix) 01/15/2017, 01/04/2018    TDAP status: Up to date Flu Vaccine status: Up to date Pneumococcal vaccine status: Up to date Covid-19 vaccine status: Completed vaccines  Qualifies for Shingles Vaccine? Yes   Zostavax completed Yes   Shingrix Completed?: Yes  Screening Tests Health  Maintenance  Topic Date Due  . DEXA SCAN  10/15/2019  . OPHTHALMOLOGY EXAM  02/01/2020  . FOOT EXAM  04/25/2020  . TETANUS/TDAP  03/06/2028  . INFLUENZA VACCINE  Completed  . COVID-19 Vaccine  Completed  . PNA vac Low Risk Adult  Completed    Health Maintenance  Health Maintenance Due  Topic Date Due  . DEXA SCAN  10/15/2019  . OPHTHALMOLOGY EXAM  02/01/2020  . FOOT EXAM  04/25/2020    Colorectal cancer screening: No longer required.   Lung Cancer Screening: (  Low Dose CT Chest recommended if Age 13-80 years, 30 pack-year currently smoking OR have quit w/in 15years.) does not qualify.   Lung Cancer Screening Referral: no  Additional Screening:  Hepatitis C Screening: does not qualify; Completed: no  Vision Screening: Recommended annual ophthalmology exams for early detection of glaucoma and other disorders of the eye. Is the patient up to date with their annual eye exam?  Yes  Who is the provider or what is the name of the office in which the patient attends annual eye exams? Shon Hough, MD If pt is not established with a provider, would they like to be referred to a provider to establish care? No .   Dental Screening: Recommended annual dental exams for proper oral hygiene  Community Resource Referral / Chronic Care Management: CRR required this visit?  No   CCM required this visit?  No      Plan:     I have personally reviewed and noted the following in the patient's chart:   . Medical and social history . Use of alcohol, tobacco or illicit drugs  . Current medications and supplements . Functional ability and status . Nutritional status . Physical activity . Advanced directives . List of other physicians . Hospitalizations, surgeries, and ER visits in previous 12 months . Vitals . Screenings to include cognitive, depression, and falls . Referrals and appointments  In addition, I have reviewed and discussed with patient certain preventive protocols,  quality metrics, and best practice recommendations. A written personalized care plan for preventive services as well as general preventive health recommendations were provided to patient.     Sheral Flow, LPN   15/10/6977   Nurse Notes:  Patient is not physically active; he is the caregiver for his wife.

## 2020-04-29 ENCOUNTER — Ambulatory Visit: Payer: Self-pay

## 2020-04-30 ENCOUNTER — Other Ambulatory Visit: Payer: Self-pay

## 2020-04-30 ENCOUNTER — Ambulatory Visit: Payer: Medicare Other

## 2020-04-30 DIAGNOSIS — R2681 Unsteadiness on feet: Secondary | ICD-10-CM

## 2020-04-30 DIAGNOSIS — R2689 Other abnormalities of gait and mobility: Secondary | ICD-10-CM | POA: Diagnosis not present

## 2020-04-30 DIAGNOSIS — M6281 Muscle weakness (generalized): Secondary | ICD-10-CM

## 2020-04-30 NOTE — Therapy (Signed)
[facility] [address] [place], [name], [address] Phone: [phone]   Fax:  [phone]  Physical Therapy Treatment  Patient Details  Name: Jonathan [name] MRN: [id number] Date of Birth: 1933/03/28 Referring Provider (PT): Billey Gosling, MD   Encounter Date: 04/30/2020   PT End of Session - 04/30/20 1450    Visit Number 4    Number of Visits 13    Date for PT Re-Evaluation 06/11/20   POC for 6 weeks, Cert for 60 days   Authorization Type Medicare (10th Visit PN)    Progress Note Due on Visit 10    PT Start Time 1447    PT Stop Time 1529    PT Time Calculation (min) 42 min    Equipment Utilized During Treatment Gait belt    Activity Tolerance Patient tolerated treatment well    Behavior During Therapy WFL for tasks assessed/performed           Past Medical History:  Diagnosis Date  . Atrial fibrillation (HCC)    a. paroxysmal, on Eliquis for anticoagulation  . BPH (benign prostatic hyperplasia)   . Bronchitis   . Cataract   . COPD (chronic obstructive pulmonary disease) (HCC)    2 liters O2 HS  . Depression   . Fatty tumor    waste and back  . Fibromyalgia   . GERD (gastroesophageal reflux disease)   . Glaucoma    bilateral eyes  . Gout   . Hereditary and idiopathic peripheral neuropathy 08/28/2015  . Hypertension   . Hypothyroidism   . Hypoxia   . Insomnia   . Osteoarthritis   . Osteoporosis   . Peripheral neuropathy   . Sleep apnea    uses cpap-add oxygen at night  . Spinal compression fracture (HCC) seventh vertebre  . Transient alteration of awareness 08/28/2015  . Type 2 diabetes mellitus ([place]) 03/08/2016    Past Surgical History:  Procedure Laterality Date  . APPENDECTOMY  age 29  . CARPAL TUNNEL RELEASE Right    early 2000s  . CATARACT EXTRACTION     bilateral  . CHOLECYSTECTOMY  age 44  . EYE SURGERY    . FOOT ARTHRODESIS Right 02/02/2013   Procedure: RIGHT HALLUX METATARSAL  PHALANGEAL JOINT ARTHRODESIS ;  Surgeon: Wylene Simmer, MD;  Location: [facility];  Service: Orthopedics;  Laterality: Right;  . INGUINAL HERNIA REPAIR  age 68   rt side  . NASAL CONCHA BULLOSA RESECTION  age 32  . PROSTATE SURGERY    . SHOULDER ARTHROSCOPY W/ ROTATOR CUFF REPAIR Right    early 2000s  . tonsil    . VASECTOMY  age 59    There were no vitals filed for this visit.   Subjective Assessment - 04/30/20 1451    Subjective Patient reports no changes since last visit. No falls.    Pertinent History pAF on Eliquis, chronic pain on oxycodone, previous SDH, COPD on home O2 noct, HTN, hypothyroidism, OSA on CPAP, and DM, Neuropathy    Limitations Standing;Walking    Patient Stated Goals Improve the Balance; Improve Core Strength    Currently in Pain? Yes    Pain Score 4     Pain Location Back    Pain Orientation Mid    Pain Descriptors / Indicators Aching;Constant    Pain Type Chronic pain    Pain Onset More than a month ago  [facility] Adult PT Treatment/Exercise - 04/30/20 0001      Ambulation/Gait   Ambulation/Gait Yes    Ambulation/Gait Assistance 5: Supervision    Ambulation/Gait Assistance Details completed ambulation with scanning environment including horizontal/vertical head turns x 230 ft, with including naming objects with visual scanning. continued increase in balance challenge with addition of cognitive task.     Ambulation Distance (Feet) 230 Feet    Assistive device None    Gait Pattern Step-through pattern    Ambulation Surface Level;Indoor      High Level Balance   High Level Balance Activities Negotiating over obstacles;Side stepping;Backward walking;Marching forwards    High Level Balance Comments Completed marching forwards, sidestepping, and backwards walking, 40' x 4 reps of each of the following. Completed obstacle course with negotiating over obstacles (orange hurdles) followed by ambulating across complaint surfaces,  completed x 2 reps without cognitive task. Added additon of cognitive task including counting up/down by 2's. increased dififculty with completing cognitive task and balance impacted significantly.       Neuro Re-ed    Neuro Re-ed Details  Completed side stepping over orange hurdles x 3 laps, down and back countertop with light UE support intermittently. verbal cues to promote improved hip abduction and avoid bringing foot posteriorly behind hurdle. Standing without UE support, completing toe taps to varous numbers/letters when announced to the patient. PT providing sequence of 1, progressing to sequence of 2 and 3. Increased challenge with sequence of 3 as increased balance challenge. Completed x 8 minutes, overall patient tolerating well and able to complete with CGA from PT.                Balance Exercises - 04/30/20 0001      Balance Exercises: Standing   Standing Eyes Opened Narrow base of support (BOS);Head turns;Foam/compliant surface;Limitations    Standing Eyes Opened Limitations completed horizontal/vertical head turns 1 x 10 reps on airex, CGA    Standing Eyes Closed Wide (BOA);3 reps;30 secs;Limitations;Foam/compliant surface;Head turns;Solid surface    Standing Eyes Closed Limitations completed eyes closed on airex with wide BOS    Other Standing Exercises standing on airex completed stance with feet together and alteranting shoulder flexion               PT Short Term Goals - 04/12/20 1302      PT SHORT TERM GOAL #1   Title Patient will be independent with balance/strengthening HEP (All STGS Due: 05/03/20)    Baseline no HEP established    Time 3    Period Weeks    Status New    Target Date 05/03/20      PT SHORT TERM GOAL #2   Title Patient improve FGA to >/= 19/30 to demonstrate improved balance and reduced fall risk    Baseline 17/30    Time 3    Period Weeks    Status New      PT SHORT TERM GOAL #3   Title Patient will be educated on fall  prevention/strategies to further reduce fall risk    Baseline dependent    Time 3    Period Weeks    Status New             PT Long Term Goals - 04/12/20 1306      PT LONG TERM GOAL #1   Title Patient will be independent with with final balance/strengthening HEP (All LTGs Due: 05/24/20)    Baseline no HEP established    Time 6  Period Weeks    Status New    Target Date 05/24/20      PT LONG TERM GOAL #2   Title Patient will improve FGA to >/= 23/30 to demonstrate reduced fall risk.    Baseline 19/30    Time 6    Period Weeks    Status New      PT LONG TERM GOAL #3   Title Patient will demo ability to complete situation 3 for 30 seconds, and situation 4 at least 20 seconds.    Baseline 23 secs on Situation 3, <5 seconds on Situation 4    Time 6    Period Weeks    Status New      PT LONG TERM GOAL #4   Title Patient will be able to complete 5x sit <> stand without UE support in </= 10 seconds to demonstrate improved balance    Baseline 12.1 secs    Time 6    Period Weeks    Status New      PT LONG TERM GOAL #5   Title Patient will improve TUG to </= 10 secs to demonstrate improved functional mobility and reduced fall risk    Baseline 12.16    Time 6    Period Weeks    Status New                 Plan - 04/30/20 1710    Clinical Impression Statement Continued high level balance exercises today during session, increased challenge with addition of cogntiive task requiring CGA from PT and intermittent UE support. Continued activites to promote improved balance with patient tolerating well. Will continue per POC.    Personal Factors and Comorbidities Comorbidity 3+    Comorbidities pAF on Eliquis, chronic pain on oxycodone and Ambien, previous SDH, COPD on home O2 noct, HTN, hypothyroidism, OSA on CPAP, and DM    Examination-Activity Limitations Locomotion Level;Stairs    Examination-Participation Restrictions Community Activity;Yard Work     [employer]chant [employer] officer Evolving/Moderate complexity    Rehab Potential Good    PT Frequency 2x / week    PT Duration 6 weeks    PT Treatment/Interventions ADLs/Self Care Home Management;Electrical Stimulation;DME Instruction;Stair training;Functional mobility training;Gait training;Therapeutic activities;Therapeutic exercise;Balance training;Neuromuscular re-education;Patient/family education;Vestibular    PT Next Visit Plan continue balance exercsies (complaint surfaces), tandem walking. strengtening exercises. SciFit    Consulted and Agree with Plan of Care Patient           Patient will benefit from skilled therapeutic intervention in order to improve the following deficits and impairments:  Decreased balance, Decreased strength, Pain, Impaired sensation, Difficulty walking, Decreased endurance, Abnormal gait  Visit Diagnosis: Muscle weakness (generalized)  Other abnormalities of gait and mobility  Unsteadiness on feet     Problem List Patient Active Problem List   Diagnosis Date Noted  . Recurrent falls 03/25/2020  . SDH (subdural hematoma) ([facility]) 03/21/2020  . Intracranial hemorrhage ([place] [facility]) 03/14/2020  . Fall at home, subsequent encounter 03/14/2020  . Leukocytosis 03/14/2020  . Nausea 09/13/2019  . Rotator cuff arthropathy of left shoulder 07/21/2019  . Dupuytren's disease of palm 07/12/2019  . Sebaceous cyst 06/27/2019  . COVID-19 05/08/2019  . Acute encephalopathy 05/08/2019  . Obstructive sleep apnea   . Asymmetrical left sensorineural hearing loss 07/12/2018  . Chronic anticoagulation 12/21/2017  . Compression fracture of thoracic vertebra ([facility]) 09/21/2017  . Degeneration of thoracic intervertebral disc 09/21/2017  . Thoracic back pain 08/10/2017  . Degenerative joint disease of hand  07/14/2017  . Osteoarthritis   . Hypoxia   . Fibromyalgia   . Fatty tumor   . Cataract   . BPH (benign prostatic hyperplasia)   . Chronic atrial fibrillation ([facility])     . Insomnia 03/10/2017  . Chronic pain, legs and back 03/10/2017  . Hyperlipidemia 03/10/2017  . Obesity (BMI 30.0-34.9) 12/08/2016  . Depression 09/07/2016  . Spondylolisthesis 09/07/2016  . Hypothyroidism 03/08/2016  . GERD (gastroesophageal reflux disease) 03/08/2016  . Gout 03/08/2016  . Glaucoma 03/08/2016  . Type 2 diabetes mellitus ([place]) 03/08/2016  . Hereditary and idiopathic peripheral neuropathy 08/28/2015  . Essential hypertension 12/04/2011  . Allergic rhinitis, seasonal 12/04/2011  . COPD (chronic obstructive pulmonary disease) ([facility]) 07/18/2011    Jones Bales, PT, DPT 04/30/2020, 5:13 PM  [facility] [address] [place], [name], [address] Phone: [phone]   Fax:  [phone]  Name: Ysabel Cowgill MRN: [id number] Date of Birth: Aug 21, 1932

## 2020-05-01 ENCOUNTER — Other Ambulatory Visit: Payer: Self-pay | Admitting: *Deleted

## 2020-05-01 DIAGNOSIS — I480 Paroxysmal atrial fibrillation: Secondary | ICD-10-CM

## 2020-05-01 MED ORDER — DILTIAZEM HCL ER COATED BEADS 300 MG PO CP24: 300.0000 mg | ORAL_CAPSULE | Freq: Every day | ORAL | 0 refills | Status: DC

## 2020-05-02 ENCOUNTER — Other Ambulatory Visit: Payer: Self-pay

## 2020-05-02 ENCOUNTER — Ambulatory Visit: Payer: Medicare Other

## 2020-05-02 DIAGNOSIS — M6281 Muscle weakness (generalized): Secondary | ICD-10-CM | POA: Diagnosis not present

## 2020-05-02 DIAGNOSIS — R2681 Unsteadiness on feet: Secondary | ICD-10-CM | POA: Diagnosis not present

## 2020-05-02 DIAGNOSIS — R2689 Other abnormalities of gait and mobility: Secondary | ICD-10-CM

## 2020-05-02 NOTE — Patient Instructions (Signed)
Access Code: [email or web address] URL: [email or web address] Date: 05/02/2020 Prepared by: Baldomero Lamy  Exercises Standing Balance with Eyes Closed on Foam - 1 x daily - 5 x weekly - 1 sets - 3 reps - 30 hold Romberg Stance on Foam Pad with Head Rotation - 1 x daily - 5 x weekly - 2 sets - 10 reps Romberg Stance with Head Nods on Foam Pad - 1 x daily - 5 x weekly - 2 sets - 10 reps Standing Romberg to 3/4 Tandem Stance - 1 x daily - 5 x weekly - 1 sets - 3 reps - 30 hold Standing March with Counter Support - 1 x daily - 5 x weekly - 2 sets - 10 reps Standing Hip Abduction with Counter Support - 1 x daily - 5 x weekly - 2 sets - 10 reps Standing Hip Extension with Counter Support - 1 x daily - 5 x weekly - 2 sets - 10 reps  Patient Education Falls at [facility] [place]

## 2020-05-02 NOTE — Therapy (Signed)
[facility] [address] [place], [name], [address] Phone: [phone]   Fax:  [phone]  Physical Therapy Treatment  Patient Details  Name: Jonathan [name] MRN: [id number] Date of Birth: 11-Oct-1932 Referring Provider (PT): Billey Gosling, MD   Encounter Date: 05/02/2020   PT End of Session - 05/02/20 1109    Visit Number 5    Number of Visits 13    Date for PT Re-Evaluation 06/11/20   POC for 6 weeks, Cert for 60 days   Authorization Type Medicare (10th Visit PN)    Progress Note Due on Visit 10    PT Start Time 1106    PT Stop Time 1145    PT Time Calculation (min) 39 min    Equipment Utilized During Treatment Gait belt    Activity Tolerance Patient tolerated treatment well    Behavior During Therapy WFL for tasks assessed/performed           Past Medical History:  Diagnosis Date   Atrial fibrillation ([facility])    a. paroxysmal, on Eliquis for anticoagulation   BPH (benign prostatic hyperplasia)    Bronchitis    Cataract    COPD (chronic obstructive pulmonary disease) (HCC)    2 liters O2 HS   Depression    Fatty tumor    waste and back   Fibromyalgia    GERD (gastroesophageal reflux disease)    Glaucoma    bilateral eyes   Gout    Hereditary and idiopathic peripheral neuropathy 08/28/2015   Hypertension    Hypothyroidism    Hypoxia    Insomnia    Osteoarthritis    Osteoporosis    Peripheral neuropathy    Sleep apnea    uses cpap-add oxygen at night   Spinal compression fracture ([facility]) seventh vertebre   Transient alteration of awareness 08/28/2015   Type 2 diabetes mellitus ([facility]) 03/08/2016    Past Surgical History:  Procedure Laterality Date   APPENDECTOMY  age 19   CARPAL TUNNEL RELEASE Right    early 2000s   CATARACT EXTRACTION     bilateral   CHOLECYSTECTOMY  age 69   EYE SURGERY     FOOT ARTHRODESIS Right 02/02/2013   Procedure: RIGHT HALLUX METATARSAL  PHALANGEAL JOINT ARTHRODESIS ;  Surgeon: Wylene Simmer, MD;  Location: [facility];  Service: Orthopedics;  Laterality: Right;   INGUINAL HERNIA REPAIR  age 40   rt side   NASAL CONCHA BULLOSA RESECTION  age 29   PROSTATE SURGERY     SHOULDER ARTHROSCOPY W/ ROTATOR CUFF REPAIR Right    early 2000s   tonsil     VASECTOMY  age 24    There were no vitals filed for this visit.   Subjective Assessment - 05/02/20 1110    Subjective Patient reports no new complaints/changes. No falls.    Pertinent History pAF on Eliquis, chronic pain on oxycodone, previous SDH, COPD on home O2 noct, HTN, hypothyroidism, OSA on CPAP, and DM, Neuropathy    Limitations Standing;Walking    Patient Stated Goals Improve the Balance; Improve Core Strength    Currently in Pain? No/denies    Pain Onset More than a month ago              [facility] PT Assessment - 05/02/20 1111      Functional Gait  Assessment   Gait assessed  Yes    Gait Level Surface Walks 20 ft in less than 7 sec but greater  than 5.5 sec, uses assistive device, slower speed, mild gait deviations, or deviates 6-10 in outside of the 12 in walkway width.    Change in Gait Speed Able to smoothly change walking speed without loss of balance or gait deviation. Deviate no more than 6 in outside of the 12 in walkway width.    Gait with Horizontal Head Turns Performs head turns smoothly with slight change in gait velocity (eg, minor disruption to smooth gait path), deviates 6-10 in outside 12 in walkway width, or uses an assistive device.    Gait with Vertical Head Turns Performs head turns with no change in gait. Deviates no more than 6 in outside 12 in walkway width.    Gait and Pivot Turn Pivot turns safely in greater than 3 sec and stops with no loss of balance, or pivot turns safely within 3 sec and stops with mild imbalance, requires small steps to catch balance.    Step Over Obstacle Is able to step over one shoe box (4.5 in total  height) without changing gait speed. No evidence of imbalance.    Gait with Narrow Base of Support Ambulates 4-7 steps.    Gait with Eyes Closed Walks 20 ft, slow speed, abnormal gait pattern, evidence for imbalance, deviates 10-15 in outside 12 in walkway width. Requires more than 9 sec to ambulate 20 ft.    Ambulating Backwards Walks 20 ft, uses assistive device, slower speed, mild gait deviations, deviates 6-10 in outside 12 in walkway width.    Steps Alternating feet, must use rail.    Total Score 20    FGA comment: 20/30 = Medium Fall Risk                [facility] Adult PT Treatment/Exercise - 05/02/20 0001      Ambulation/Gait   Ambulation/Gait Yes    Ambulation/Gait Assistance 5: Supervision    Ambulation/Gait Assistance Details througout therapy session with activities    Ambulation Distance (Feet) --   clinic distances   Assistive device None    Gait Pattern Step-through pattern    Ambulation Surface Level;Indoor      Self-Care   Self-Care Other Self-Care Comments    Other Self-Care Comments  PT educating patient on fall prevention strategies and reviewed fall prvention checklist wiht patient. PT providing extensive education on ways to reduce falls within the home to promote improved safety.           Reviewed entire HEP and progressed as tolerated by patient. Patient tolerating progression of all bolded exercises during session.   Access Code: [email or web address] URL: [email or web address] Date: 05/02/2020 Prepared by: Baldomero Lamy  Exercises Standing Balance with Eyes Closed on Foam - 1 x daily - 5 x weekly - 1 sets - 3 reps - 30 hold Romberg Stance on Foam Pad with Head Rotation - 1 x daily - 5 x weekly - 2 sets - 10 reps Romberg Stance with Head Nods on Foam Pad - 1 x daily - 5 x weekly - 2 sets - 10 reps Standing Romberg to 3/4 Tandem Stance - 1 x daily - 5 x weekly - 1 sets - 3 reps - 30 hold Standing March with Counter Support - 1 x daily - 5 x weekly - 2  sets - 10 reps Standing Hip Abduction with Counter Support - 1 x daily - 5 x weekly - 2 sets - 10 reps Standing Hip Extension with Counter Support - 1 x daily - 5 x weekly -  2 sets - 10 reps  Patient Education Falls at [facility]        PT Education - 05/02/20 1147    Education Details Fall Prevention (see self care); Updated HEP    Person(s) Educated Patient    Methods Explanation;Demonstration;Handout    Comprehension Verbalized understanding;Returned demonstration            PT Short Term Goals - 05/02/20 1327      PT SHORT TERM GOAL #1   Title Patient will be independent with balance/strengthening HEP (All STGS Due: 05/03/20)    Baseline Patient reports independence    Time 3    Period Weeks    Status Achieved    Target Date 05/03/20      PT SHORT TERM GOAL #2   Title Patient improve FGA to >/= 19/30 to demonstrate improved balance and reduced fall risk    Baseline 17/30, 20/30    Time 3    Period Weeks    Status Achieved      PT SHORT TERM GOAL #3   Title Patient will be educated on fall prevention/strategies to further reduce fall risk    Baseline Educated on fall prevention; Handout Provided    Time 3    Period Weeks    Status Achieved             PT Long Term Goals - 04/12/20 1306      PT LONG TERM GOAL #1   Title Patient will be independent with with final balance/strengthening HEP (All LTGs Due: 05/24/20)    Baseline no HEP established    Time 6    Period Weeks    Status New    Target Date 05/24/20      PT LONG TERM GOAL #2   Title Patient will improve FGA to >/= 23/30 to demonstrate reduced fall risk.    Baseline 19/30    Time 6    Period Weeks    Status New      PT LONG TERM GOAL #3   Title Patient will demo ability to complete situation 3 for 30 seconds, and situation 4 at least 20 seconds.    Baseline 23 secs on Situation 3, <5 seconds on Situation 4    Time 6    Period Weeks    Status New      PT LONG TERM GOAL #4   Title  Patient will be able to complete 5x sit <> stand without UE support in </= 10 seconds to demonstrate improved balance    Baseline 12.1 secs    Time 6    Period Weeks    Status New      PT LONG TERM GOAL #5   Title Patient will improve TUG to </= 10 secs to demonstrate improved functional mobility and reduced fall risk    Baseline 12.16    Time 6    Period Weeks    Status New                 Plan - 05/02/20 1330    Clinical Impression Statement Today's skilled PT session included assessment of patient's progress toward all STGs. Patient able to meet all STG today during session. Completed FGA with patient scoring 20/30 today. Rest of session spent educating patietn on fall prevention and progressing HEP as tolerated . Will continue per POC.    Personal Factors and Comorbidities Comorbidity 3+    Comorbidities pAF on Eliquis, chronic pain on oxycodone and  Ambien, previous SDH, COPD on home O2 noct, HTN, hypothyroidism, OSA on CPAP, and DM    Examination-Activity Limitations Locomotion Level;Stairs    Examination-Participation Restrictions Community Activity;Yard Work    [employer]chant [employer] officer Evolving/Moderate complexity    Rehab Potential Good    PT Frequency 2x / week    PT Duration 6 weeks    PT Treatment/Interventions ADLs/Self Care Home Management;Electrical Stimulation;DME Instruction;Stair training;Functional mobility training;Gait training;Therapeutic activities;Therapeutic exercise;Balance training;Neuromuscular re-education;Patient/family education;Vestibular    PT Next Visit Plan continue balance exercsies (complaint surfaces), tandem walking. strengtening exercises. SciFit    Consulted and Agree with Plan of Care Patient           Patient will benefit from skilled therapeutic intervention in order to improve the following deficits and impairments:  Decreased balance, Decreased strength, Pain, Impaired sensation, Difficulty walking, Decreased endurance,  Abnormal gait  Visit Diagnosis: Muscle weakness (generalized)  Other abnormalities of gait and mobility  Unsteadiness on feet     Problem List Patient Active Problem List   Diagnosis Date Noted   Recurrent falls 03/25/2020   SDH (subdural hematoma) ([place]) 03/21/2020   Intracranial hemorrhage ([place]) 03/14/2020   Fall at home, subsequent encounter 03/14/2020   Leukocytosis 03/14/2020   Nausea 09/13/2019   Rotator cuff arthropathy of left shoulder 07/21/2019   Dupuytren's disease of palm 07/12/2019   Sebaceous cyst 06/27/2019   COVID-19 05/08/2019   Acute encephalopathy 05/08/2019   Obstructive sleep apnea    Asymmetrical left sensorineural hearing loss 07/12/2018   Chronic anticoagulation 12/21/2017   Compression fracture of thoracic vertebra (HCC) 09/21/2017   Degeneration of thoracic intervertebral disc 09/21/2017   Thoracic back pain 08/10/2017   Degenerative joint disease of hand 07/14/2017   Osteoarthritis    Hypoxia    Fibromyalgia    Fatty tumor    Cataract    BPH (benign prostatic hyperplasia)    Chronic atrial fibrillation (HCC)    Insomnia 03/10/2017   Chronic pain, legs and back 03/10/2017   Hyperlipidemia 03/10/2017   Obesity (BMI 30.0-34.9) 12/08/2016   Depression 09/07/2016   Spondylolisthesis 09/07/2016   Hypothyroidism 03/08/2016   GERD (gastroesophageal reflux disease) 03/08/2016   Gout 03/08/2016   Glaucoma 03/08/2016   Type 2 diabetes mellitus ([place] [facility] [place]) 03/08/2016   Hereditary and idiopathic peripheral neuropathy 08/28/2015   Essential hypertension 12/04/2011   Allergic rhinitis, seasonal 12/04/2011   COPD (chronic obstructive pulmonary disease) ([place]) 07/18/2011    Jones Bales, PT, DPT 05/02/2020, 1:32 PM  [facility] [address] [place], [name], [address] Phone: [phone]   Fax:  [phone]  Name: Jonathan [name] MRN:  [id number] Date of Birth: [birth year]

## 2020-05-07 ENCOUNTER — Ambulatory Visit: Payer: Medicare Other

## 2020-05-08 DIAGNOSIS — I480 Paroxysmal atrial fibrillation: Secondary | ICD-10-CM

## 2020-05-08 MED ORDER — DILTIAZEM HCL ER COATED BEADS 300 MG PO CP24: 300.0000 mg | ORAL_CAPSULE | Freq: Every day | ORAL | 0 refills | Status: DC

## 2020-05-08 NOTE — Telephone Encounter (Signed)
10 day supply sent to preferred pharmacy as requested.   Patient reports he's had some episodes of racing heartbeat with no other symptoms. He doesn't get lightheaded or dizzy. He just feels the need to check his pulse rate.  Readings have been as follows:  11/7: 152 11/8: 128 11/9: 159 11/20: 159  Pulse rate is now 96 after walking down a flight of stairs for the pulse oximeter. Pulse rate is steady. Patient has no symptoms at time of call. He is interested in a "vest" that Dr. Stanford Breed recommended or had him try in the past that shows more heart rate information than the oximeter does. Patient aware to call the office if HR increases again or if he becomes symptomatic.   Will route to MD and primary RN.

## 2020-05-13 ENCOUNTER — Other Ambulatory Visit (INDEPENDENT_AMBULATORY_CARE_PROVIDER_SITE_OTHER): Payer: Medicare Other

## 2020-05-13 ENCOUNTER — Ambulatory Visit: Payer: Medicare Other

## 2020-05-13 ENCOUNTER — Other Ambulatory Visit: Payer: Self-pay

## 2020-05-13 DIAGNOSIS — R2689 Other abnormalities of gait and mobility: Secondary | ICD-10-CM

## 2020-05-13 DIAGNOSIS — Z20822 Contact with and (suspected) exposure to covid-19: Secondary | ICD-10-CM | POA: Diagnosis not present

## 2020-05-13 DIAGNOSIS — M6281 Muscle weakness (generalized): Secondary | ICD-10-CM

## 2020-05-13 DIAGNOSIS — I480 Paroxysmal atrial fibrillation: Secondary | ICD-10-CM

## 2020-05-13 DIAGNOSIS — R2681 Unsteadiness on feet: Secondary | ICD-10-CM

## 2020-05-13 MED ORDER — DILTIAZEM HCL ER COATED BEADS 300 MG PO CP24: 300.0000 mg | ORAL_CAPSULE | Freq: Every day | ORAL | 3 refills | Status: DC

## 2020-05-13 NOTE — Therapy (Signed)
[facility] [address] [place], [name], [address] Phone: [phone]   Fax:  [phone]  Physical Therapy Treatment  Patient Details  Name: Jonathan [name] MRN: [id number] Date of Birth: [birth year] Referring Provider (PT): Billey Gosling, MD   Encounter Date: 05/13/2020   PT End of Session - 05/13/20 1019    Visit Number 6    Number of Visits 13    Date for PT Re-Evaluation 06/11/20   POC for 6 weeks, Cert for 60 days   Authorization Type Medicare (10th Visit PN)    Progress Note Due on Visit 10    PT Start Time 1017    PT Stop Time 1059    PT Time Calculation (min) 42 min    Equipment Utilized During Treatment Gait belt    Activity Tolerance Patient tolerated treatment well    Behavior During Therapy WFL for tasks assessed/performed           Past Medical History:  Diagnosis Date  . Atrial fibrillation (HCC)    a. paroxysmal, on Eliquis for anticoagulation  . BPH (benign prostatic hyperplasia)   . Bronchitis   . Cataract   . COPD (chronic obstructive pulmonary disease) (HCC)    2 liters O2 HS  . Depression   . Fatty tumor    waste and back  . Fibromyalgia   . GERD (gastroesophageal reflux disease)   . Glaucoma    bilateral eyes  . Gout   . Hereditary and idiopathic peripheral neuropathy 08/28/2015  . Hypertension   . Hypothyroidism   . Hypoxia   . Insomnia   . Osteoarthritis   . Osteoporosis   . Peripheral neuropathy   . Sleep apnea    uses cpap-add oxygen at night  . Spinal compression fracture (HCC) seventh vertebre  . Transient alteration of awareness 08/28/2015  . Type 2 diabetes mellitus ([place]) 03/08/2016    Past Surgical History:  Procedure Laterality Date  . APPENDECTOMY  age 86  . CARPAL TUNNEL RELEASE Right    early 2000s  . CATARACT EXTRACTION     bilateral  . CHOLECYSTECTOMY  age 86  . EYE SURGERY    . FOOT ARTHRODESIS Right 02/02/2013   Procedure: RIGHT HALLUX METATARSAL  PHALANGEAL JOINT ARTHRODESIS ;  Surgeon: Wylene Simmer, MD;  Location: [facility];  Service: Orthopedics;  Laterality: Right;  . INGUINAL HERNIA REPAIR  age 86   rt side  . NASAL CONCHA BULLOSA RESECTION  age 86  . PROSTATE SURGERY    . SHOULDER ARTHROSCOPY W/ ROTATOR CUFF REPAIR Right    early 2000s  . tonsil    . VASECTOMY  age 86    There were no vitals filed for this visit.   Subjective Assessment - 05/13/20 1019    Subjective Patient reports that he has been having some low back/hip pain. No falls.    Pertinent History pAF on Eliquis, chronic pain on oxycodone, previous SDH, COPD on home O2 noct, HTN, hypothyroidism, OSA on CPAP, and DM, Neuropathy    Limitations Standing;Walking    Patient Stated Goals Improve the Balance; Improve Core Strength    Currently in Pain? Yes    Pain Score 6     Pain Location Back    Pain Orientation Mid;Lower    Pain Descriptors / Indicators Aching    Pain Type Acute pain    Pain Onset Yesterday    Pain Frequency Intermittent    Aggravating Factors  prolonged position  Pain Relieving Factors pain medication    Multiple Pain Sites Yes    Pain Score 6    Pain Location Hip    Pain Orientation Right    Pain Descriptors / Indicators Aching;Sharp    Pain Type Acute pain    Pain Onset Yesterday    Pain Frequency Intermittent    Aggravating Factors  prolonged position    Pain Relieving Factors pain medication                [facility] Adult PT Treatment/Exercise - 05/13/20 1028      Ambulation/Gait   Ambulation/Gait Yes    Ambulation/Gait Assistance 5: Supervision    Ambulation/Gait Assistance Details with high level balance    Assistive device None    Gait Pattern Step-through pattern    Ambulation Surface Level;Indoor      High Level Balance   High Level Balance Activities Sudden stops;Head turns;Backward walking;Turns;Direction changes    High Level Balance Comments In hallway completed ambulation with the following  high level balance activites: including sudden stops, sudden full body turns, and backwards walking. Completed 50' x 2 with PT calling out balance challenge and patient completing. One instance of increased balance challenge require CGA for steadying. Completed horizontal/vertical head turns 50' x 2 each. increased sway noted with horizontal > vertical.                Balance Exercises - 05/13/20 0001      Balance Exercises: Standing   Tandem Stance Eyes open;Intermittent upper extremity support;Limitations;Time    Tandem Stance Time standing 3/4th tandem stance, 2 x 30 seconds each. Progressed to addition of horizontal/vertical head turns 1 x 10. increased CGA with horizontal head turns.     Rockerboard Anterior/posterior;Lateral;EO;Head turns;Intermittent UE support;Limitations    Rockerboard Limitations at countertop with single UE support, standing on rockerboard A/P completed holding steady 2 x 1 minute each, progressed to horizontal/vertical head x 10 reps each. Completed A/P weight shifts x 10 reps each direction. Also completed with board holding steady, alternating shoulder flexion with BUE x 10 reps. increased challenge requiring intermittent UE support required. With board positioned laterally, completed holding steady 2 x 1 minute each, then progressed to horizontal head turns x 10 reps. Completed weight shift to R/L x 10 reps increased UE suport and verbal cues for control required. Increased challenge with board positioned laterally > A/P.     Other Standing Exercises completed kicking bean bags x 2 laps without UE support alternating foot. PT calling out color of bean bag to add in cognitive task, 1-2 instances of increased balance challenge but able to maintain balance without assitance from PT.              PT Education - 05/13/20 1209    Education Details Educated on HEP compliance    Person(s) Educated Patient    Methods Explanation    Comprehension Verbalized  understanding            PT Short Term Goals - 05/02/20 1327      PT SHORT TERM GOAL #1   Title Patient will be independent with balance/strengthening HEP (All STGS Due: 05/03/20)    Baseline Patient reports independence    Time 3    Period Weeks    Status Achieved    Target Date 05/03/20      PT SHORT TERM GOAL #2   Title Patient improve FGA to >/= 19/30 to demonstrate improved balance and reduced fall risk  Baseline 17/30, 20/30    Time 3    Period Weeks    Status Achieved      PT SHORT TERM GOAL #3   Title Patient will be educated on fall prevention/strategies to further reduce fall risk    Baseline Educated on fall prevention; Handout Provided    Time 3    Period Weeks    Status Achieved             PT Long Term Goals - 04/12/20 1306      PT LONG TERM GOAL #1   Title Patient will be independent with with final balance/strengthening HEP (All LTGs Due: 05/24/20)    Baseline no HEP established    Time 6    Period Weeks    Status New    Target Date 05/24/20      PT LONG TERM GOAL #2   Title Patient will improve FGA to >/= 23/30 to demonstrate reduced fall risk.    Baseline 19/30    Time 6    Period Weeks    Status New      PT LONG TERM GOAL #3   Title Patient will demo ability to complete situation 3 for 30 seconds, and situation 4 at least 20 seconds.    Baseline 23 secs on Situation 3, <5 seconds on Situation 4    Time 6    Period Weeks    Status New      PT LONG TERM GOAL #4   Title Patient will be able to complete 5x sit <> stand without UE support in </= 10 seconds to demonstrate improved balance    Baseline 12.1 secs    Time 6    Period Weeks    Status New      PT LONG TERM GOAL #5   Title Patient will improve TUG to </= 10 secs to demonstrate improved functional mobility and reduced fall risk    Baseline 12.16    Time 6    Period Weeks    Status New                 Plan - 05/13/20 1210    Clinical Impression Statement Today's  skilled PT session included continued balance activites focus on complaint/unlevel surfaces with addition of head turns and vision removed. Patient continue to demo increased balance challenge posteriory, especially on rockerboard today. Required intermittent UE support and CGA throughout. Will continue to progress toward all LTGs.    Personal Factors and Comorbidities Comorbidity 3+    Comorbidities pAF on Eliquis, chronic pain on oxycodone and Ambien, previous SDH, COPD on home O2 noct, HTN, hypothyroidism, OSA on CPAP, and DM    Examination-Activity Limitations Locomotion Level;Stairs    Examination-Participation Restrictions Community Activity;Yard Work    [employer] officer Evolving/Moderate complexity    Rehab Potential Good    PT Frequency 2x / week    PT Duration 6 weeks    PT Treatment/Interventions ADLs/Self Care Home Management;Electrical Stimulation;DME Instruction;Stair training;Functional mobility training;Gait training;Therapeutic activities;Therapeutic exercise;Balance training;Neuromuscular re-education;Patient/family education;Vestibular    PT Next Visit Plan continue balance exercsies (complaint surfaces), tandem walking. strengtening exercises. SciFit    Consulted and Agree with Plan of Care Patient           Patient will benefit from skilled therapeutic intervention in order to improve the following deficits and impairments:  Decreased balance, Decreased strength, Pain, Impaired sensation, Difficulty walking, Decreased endurance, Abnormal gait  Visit Diagnosis: Muscle weakness (generalized)  Other  abnormalities of gait and mobility  Unsteadiness on feet     Problem List Patient Active Problem List   Diagnosis Date Noted  . Recurrent falls 03/25/2020  . SDH (subdural hematoma) ([place]) 03/21/2020  . Intracranial hemorrhage ([place]) 03/14/2020  . Fall at home, subsequent encounter 03/14/2020  . Leukocytosis 03/14/2020  . Nausea 09/13/2019  . Rotator  cuff arthropathy of left shoulder 07/21/2019  . Dupuytren's disease of palm 07/12/2019  . Sebaceous cyst 06/27/2019  . COVID-19 05/08/2019  . Acute encephalopathy 05/08/2019  . Obstructive sleep apnea   . Asymmetrical left sensorineural hearing loss 07/12/2018  . Chronic anticoagulation 12/21/2017  . Compression fracture of thoracic vertebra ([place]) 09/21/2017  . Degeneration of thoracic intervertebral disc 09/21/2017  . Thoracic back pain 08/10/2017  . Degenerative joint disease of hand 07/14/2017  . Osteoarthritis   . Hypoxia   . Fibromyalgia   . Fatty tumor   . Cataract   . BPH (benign prostatic hyperplasia)   . Chronic atrial fibrillation ([place])   . Insomnia 03/10/2017  . Chronic pain, legs and back 03/10/2017  . Hyperlipidemia 03/10/2017  . Obesity (BMI 30.0-34.9) 12/08/2016  . Depression 09/07/2016  . Spondylolisthesis 09/07/2016  . Hypothyroidism 03/08/2016  . GERD (gastroesophageal reflux disease) 03/08/2016  . Gout 03/08/2016  . Glaucoma 03/08/2016  . Type 2 diabetes mellitus ([place]) 03/08/2016  . Hereditary and idiopathic peripheral neuropathy 08/28/2015  . Essential hypertension 12/04/2011  . Allergic rhinitis, seasonal 12/04/2011  . COPD (chronic obstructive pulmonary disease) ([place]) 07/18/2011    Jones Bales, PT, DPT 05/13/2020, 12:14 PM  [facility] [address] [place], [name], [address] Phone: [phone]   Fax:  [phone]  Name: Jedidiah Demartini MRN: [id number] Date of Birth: [birth year]

## 2020-05-13 NOTE — Telephone Encounter (Signed)
Spoke with pt, Aware of dr Jacalyn Lefevre recommendations. New script sent to the pharmacy and order placed for monitor.

## 2020-05-13 NOTE — Telephone Encounter (Signed)
Please refill cardizem; place 72 hour monitor; APP ov  Kirk Ruths

## 2020-05-15 ENCOUNTER — Encounter: Payer: Self-pay | Admitting: Internal Medicine

## 2020-05-15 DIAGNOSIS — H401132 Primary open-angle glaucoma, bilateral, moderate stage: Secondary | ICD-10-CM | POA: Diagnosis not present

## 2020-05-15 DIAGNOSIS — H16103 Unspecified superficial keratitis, bilateral: Secondary | ICD-10-CM | POA: Diagnosis not present

## 2020-05-15 DIAGNOSIS — H04123 Dry eye syndrome of bilateral lacrimal glands: Secondary | ICD-10-CM | POA: Diagnosis not present

## 2020-05-16 ENCOUNTER — Ambulatory Visit: Payer: Medicare Other | Attending: Internal Medicine | Admitting: Physical Therapy

## 2020-05-16 ENCOUNTER — Encounter: Payer: Self-pay | Admitting: Physical Therapy

## 2020-05-16 ENCOUNTER — Other Ambulatory Visit: Payer: Self-pay

## 2020-05-16 DIAGNOSIS — R2689 Other abnormalities of gait and mobility: Secondary | ICD-10-CM | POA: Diagnosis not present

## 2020-05-16 DIAGNOSIS — R2681 Unsteadiness on feet: Secondary | ICD-10-CM | POA: Diagnosis not present

## 2020-05-16 DIAGNOSIS — M6281 Muscle weakness (generalized): Secondary | ICD-10-CM | POA: Diagnosis not present

## 2020-05-16 NOTE — Therapy (Signed)
[facility] [address] [place], [name], [address] Phone: [phone]   Fax:  [phone]  Physical Therapy Treatment  Patient Details  Name: Jonathan [name] MRN: [id number] Date of Birth: 31-Jul-1932 Referring Provider (PT): Billey Gosling, MD   Encounter Date: 05/16/2020   PT End of Session - 05/16/20 1102    Visit Number 7    Number of Visits 13    Date for PT Re-Evaluation 06/11/20   POC for 6 weeks, Cert for 60 days   Authorization Type Medicare (10th Visit PN)    Progress Note Due on Visit 10    PT Start Time 1025   pt arrived late   PT Stop Time 1101    PT Time Calculation (min) 36 min    Equipment Utilized During Treatment Gait belt    Activity Tolerance Patient tolerated treatment well    Behavior During Therapy WFL for tasks assessed/performed           Past Medical History:  Diagnosis Date  . Atrial fibrillation (HCC)    a. paroxysmal, on Eliquis for anticoagulation  . BPH (benign prostatic hyperplasia)   . Bronchitis   . Cataract   . COPD (chronic obstructive pulmonary disease) (HCC)    2 liters O2 HS  . Depression   . Fatty tumor    waste and back  . Fibromyalgia   . GERD (gastroesophageal reflux disease)   . Glaucoma    bilateral eyes  . Gout   . Hereditary and idiopathic peripheral neuropathy 08/28/2015  . Hypertension   . Hypothyroidism   . Hypoxia   . Insomnia   . Osteoarthritis   . Osteoporosis   . Peripheral neuropathy   . Sleep apnea    uses cpap-add oxygen at night  . Spinal compression fracture (HCC) seventh vertebre  . Transient alteration of awareness 08/28/2015  . Type 2 diabetes mellitus ([place]) 03/08/2016    Past Surgical History:  Procedure Laterality Date  . APPENDECTOMY  age 54  . CARPAL TUNNEL RELEASE Right    early 2000s  . CATARACT EXTRACTION     bilateral  . CHOLECYSTECTOMY  age 49  . EYE SURGERY    . FOOT ARTHRODESIS Right 02/02/2013   Procedure: RIGHT  HALLUX METATARSAL PHALANGEAL JOINT ARTHRODESIS ;  Surgeon: Wylene Simmer, MD;  Location: [facility];  Service: Orthopedics;  Laterality: Right;  . INGUINAL HERNIA REPAIR  age 26   rt side  . NASAL CONCHA BULLOSA RESECTION  age 72  . PROSTATE SURGERY    . SHOULDER ARTHROSCOPY W/ ROTATOR CUFF REPAIR Right    early 2000s  . tonsil    . VASECTOMY  age 10    There were no vitals filed for this visit.   Subjective Assessment - 05/16/20 1027    Subjective No changes. No falls.    Pertinent History pAF on Eliquis, chronic pain on oxycodone, previous SDH, COPD on home O2 noct, HTN, hypothyroidism, OSA on CPAP, and DM, Neuropathy    Limitations Standing;Walking    Patient Stated Goals Improve the Balance; Improve Core Strength    Currently in Pain? No/denies    Pain Onset Yesterday    Pain Onset [facility] Adult PT Treatment/Exercise - 05/16/20 0001      Ambulation/Gait   Ambulation/Gait Yes  Ambulation/Gait Assistance 5: Supervision;4: Min guard    Ambulation/Gait Assistance Details gait down hallway down 4 x 75' in total- tossing ball with gait, then performed adding in cognitive challenge with naming foods A-Z and continuing ball toss, pt needing min guard and needing to stop gait at times in order to think of name of food with pt needing intermittent verbal cues for naming    Assistive device None    Gait Pattern Step-through pattern    Ambulation Surface Level;Indoor               Balance Exercises - 05/16/20 0001      Balance Exercises: Standing   Tandem Stance Eyes open;Upper extremity support 1;Limitations    Tandem Stance Time on blue balance beam keeping one foot still; bringing one leg forwards and then back (in tandem stance) x10 reps B, cues for slowed and controlled    SLS with Vectors Foam/compliant surface;Intermittent upper extremity assist;Limitations    SLS with Vectors Limitations On blue foam  beam: wide BOS lateral weight shifting x10 reps with no UE support and then progressing to shifting weight and lifting up foot for dynamic SLS x10 reps B with intermittent UE support    Tandem Gait Forward;Limitations    Tandem Gait Limitations on blue beam, down and back x3 reps, cues for slowed and controlled, intermittent UE support   Step Over Hurdles / Cones 4 smaller hurdles next to countertop: down and back x6 reps, cues for step through pattern, beginning with UE support and progressing to none, cues for slowed and controlled for SLS and cues for incr hip/knee flexion vs. Swinging leg around, min guard/min A for balance at times     Other Standing Exercises standing on air ex: alternating forward cone taps to 2 cones x10 reps B - intermittent UE support with min guard/min A at times for balance, then standing on level ground - forward and cross body cone taps to 3 cones x5 reps B, pt continuing to need UE support on level surfaces               PT Short Term Goals - 05/02/20 1327      PT SHORT TERM GOAL #1   Title Patient will be independent with balance/strengthening HEP (All STGS Due: 05/03/20)    Baseline Patient reports independence    Time 3    Period Weeks    Status Achieved    Target Date 05/03/20      PT SHORT TERM GOAL #2   Title Patient improve FGA to >/= 19/30 to demonstrate improved balance and reduced fall risk    Baseline 17/30, 20/30    Time 3    Period Weeks    Status Achieved      PT SHORT TERM GOAL #3   Title Patient will be educated on fall prevention/strategies to further reduce fall risk    Baseline Educated on fall prevention; Handout Provided    Time 3    Period Weeks    Status Achieved             PT Long Term Goals - 04/12/20 1306      PT LONG TERM GOAL #1   Title Patient will be independent with with final balance/strengthening HEP (All LTGs Due: 05/24/20)    Baseline no HEP established    Time 6    Period Weeks    Status New     Target Date 05/24/20  PT LONG TERM GOAL #2   Title Patient will improve FGA to >/= 23/30 to demonstrate reduced fall risk.    Baseline 19/30    Time 6    Period Weeks    Status New      PT LONG TERM GOAL #3   Title Patient will demo ability to complete situation 3 for 30 seconds, and situation 4 at least 20 seconds.    Baseline 23 secs on Situation 3, <5 seconds on Situation 4    Time 6    Period Weeks    Status New      PT LONG TERM GOAL #4   Title Patient will be able to complete 5x sit <> stand without UE support in </= 10 seconds to demonstrate improved balance    Baseline 12.1 secs    Time 6    Period Weeks    Status New      PT LONG TERM GOAL #5   Title Patient will improve TUG to </= 10 secs to demonstrate improved functional mobility and reduced fall risk    Baseline 12.16    Time 6    Period Weeks    Status New                 Plan - 05/16/20 1105    Clinical Impression Statement Pt challenged by SLS activities today on level and unlevel surfaces, esp when standing on LLE - needing min A for balance at times. Pt with difficulty with gait with dual tasking (tossing ball and cognitive challenge), needing to stop gait at times in order to think of name of object. Will continue to progress towards LTGs.    Personal Factors and Comorbidities Comorbidity 3+    Comorbidities pAF on Eliquis, chronic pain on oxycodone and Ambien, previous SDH, COPD on home O2 noct, HTN, hypothyroidism, OSA on CPAP, and DM    Examination-Activity Limitations Locomotion Level;Stairs    Examination-Participation Restrictions Community Activity;Yard Work    [employer] officer Evolving/Moderate complexity    Rehab Potential Good    PT Frequency 2x / week    PT Duration 6 weeks    PT Treatment/Interventions ADLs/Self Care Home Management;Electrical Stimulation;DME Instruction;Stair training;Functional mobility training;Gait training;Therapeutic activities;Therapeutic  exercise;Balance training;Neuromuscular re-education;Patient/family education;Vestibular    PT Next Visit Plan continue balance exercsies (complaint surfaces), gait/balance with dual tasking/cognitive challenge, tandem walking. strengtening exercises. SciFit    Consulted and Agree with Plan of Care Patient           Patient will benefit from skilled therapeutic intervention in order to improve the following deficits and impairments:  Decreased balance, Decreased strength, Pain, Impaired sensation, Difficulty walking, Decreased endurance, Abnormal gait  Visit Diagnosis: Muscle weakness (generalized)  Other abnormalities of gait and mobility  Unsteadiness on feet     Problem List Patient Active Problem List   Diagnosis Date Noted  . Recurrent falls 03/25/2020  . SDH (subdural hematoma) ([place]) 03/21/2020  . Intracranial hemorrhage ([place]) 03/14/2020  . Fall at home, subsequent encounter 03/14/2020  . Leukocytosis 03/14/2020  . Nausea 09/13/2019  . Rotator cuff arthropathy of left shoulder 07/21/2019  . Dupuytren's disease of palm 07/12/2019  . Sebaceous cyst 06/27/2019  . COVID-19 05/08/2019  . Acute encephalopathy 05/08/2019  . Obstructive sleep apnea   . Asymmetrical left sensorineural hearing loss 07/12/2018  . Chronic anticoagulation 12/21/2017  . Compression fracture of thoracic vertebra ([place]) 09/21/2017  . Degeneration of thoracic intervertebral disc 09/21/2017  . Thoracic back pain 08/10/2017  .  Degenerative joint disease of hand 07/14/2017  . Osteoarthritis   . Hypoxia   . Fibromyalgia   . Fatty tumor   . Cataract   . BPH (benign prostatic hyperplasia)   . Chronic atrial fibrillation ([place])   . Insomnia 03/10/2017  . Chronic pain, legs and back 03/10/2017  . Hyperlipidemia 03/10/2017  . Obesity (BMI 30.0-34.9) 12/08/2016  . Depression 09/07/2016  . Spondylolisthesis 09/07/2016  . Hypothyroidism 03/08/2016  . GERD (gastroesophageal reflux disease) 03/08/2016  .  Gout 03/08/2016  . Glaucoma 03/08/2016  . Type 2 diabetes mellitus ([place]) 03/08/2016  . Hereditary and idiopathic peripheral neuropathy 08/28/2015  . Essential hypertension 12/04/2011  . Allergic rhinitis, seasonal 12/04/2011  . COPD (chronic obstructive pulmonary disease) ([place]) 07/18/2011    Arliss Journey, PT, DPT  05/16/2020, 11:06 AM  [facility] [address] [place], [name], [address] Phone: [phone]   Fax:  [phone]  Name: Jonathan [name] MRN: [id number] Date of Birth: 19-Mar-1933

## 2020-05-21 ENCOUNTER — Ambulatory Visit: Payer: Medicare Other

## 2020-05-21 ENCOUNTER — Other Ambulatory Visit: Payer: Self-pay

## 2020-05-21 DIAGNOSIS — R2681 Unsteadiness on feet: Secondary | ICD-10-CM

## 2020-05-21 DIAGNOSIS — R2689 Other abnormalities of gait and mobility: Secondary | ICD-10-CM

## 2020-05-21 DIAGNOSIS — M6281 Muscle weakness (generalized): Secondary | ICD-10-CM | POA: Diagnosis not present

## 2020-05-21 NOTE — Therapy (Signed)
[facility] [address] [place], [name], [address] Phone: [phone]   Fax:  [phone]  Physical Therapy Treatment  Patient Details  Name: Jonathan [name] MRN: [id number] Date of Birth: 1932/10/01 Referring Provider (PT): Billey Gosling, MD   Encounter Date: 05/21/2020   PT End of Session - 05/21/20 1110    Visit Number 8    Number of Visits 13    Date for PT Re-Evaluation 06/11/20   POC for 6 weeks, Cert for 60 days   Authorization Type Medicare (10th Visit PN)    Progress Note Due on Visit 10    PT Start Time 1106   pt arriving late   PT Stop Time 1145    PT Time Calculation (min) 39 min    Equipment Utilized During Treatment Gait belt    Activity Tolerance Patient tolerated treatment well    Behavior During Therapy WFL for tasks assessed/performed           Past Medical History:  Diagnosis Date  . Atrial fibrillation (HCC)    a. paroxysmal, on Eliquis for anticoagulation  . BPH (benign prostatic hyperplasia)   . Bronchitis   . Cataract   . COPD (chronic obstructive pulmonary disease) (HCC)    2 liters O2 HS  . Depression   . Fatty tumor    waste and back  . Fibromyalgia   . GERD (gastroesophageal reflux disease)   . Glaucoma    bilateral eyes  . Gout   . Hereditary and idiopathic peripheral neuropathy 08/28/2015  . Hypertension   . Hypothyroidism   . Hypoxia   . Insomnia   . Osteoarthritis   . Osteoporosis   . Peripheral neuropathy   . Sleep apnea    uses cpap-add oxygen at night  . Spinal compression fracture (HCC) seventh vertebre  . Transient alteration of awareness 08/28/2015  . Type 2 diabetes mellitus ([place]) 03/08/2016    Past Surgical History:  Procedure Laterality Date  . APPENDECTOMY  age 38  . CARPAL TUNNEL RELEASE Right    early 2000s  . CATARACT EXTRACTION     bilateral  . CHOLECYSTECTOMY  age 68  . EYE SURGERY    . FOOT ARTHRODESIS Right 02/02/2013   Procedure: RIGHT  HALLUX METATARSAL PHALANGEAL JOINT ARTHRODESIS ;  Surgeon: Wylene Simmer, MD;  Location: [facility];  Service: Orthopedics;  Laterality: Right;  . INGUINAL HERNIA REPAIR  age 33   rt side  . NASAL CONCHA BULLOSA RESECTION  age 20  . PROSTATE SURGERY    . SHOULDER ARTHROSCOPY W/ ROTATOR CUFF REPAIR Right    early 2000s  . tonsil    . VASECTOMY  age 72    There were no vitals filed for this visit.   Subjective Assessment - 05/21/20 1111    Subjective Patient reports that did not sleep well last night. Patient also reports increased pain in L knee.    Pertinent History pAF on Eliquis, chronic pain on oxycodone, previous SDH, COPD on home O2 noct, HTN, hypothyroidism, OSA on CPAP, and DM, Neuropathy    Limitations Standing;Walking    Patient Stated Goals Improve the Balance; Improve Core Strength    Currently in Pain? Yes    Pain Score 5     Pain Location Knee    Pain Orientation Left    Pain Descriptors / Indicators Aching    Pain Type Acute pain    Pain Onset Yesterday    Pain Onset Yesterday  [facility] Adult PT Treatment/Exercise - 05/21/20 0001      Transfers   Transfers Sit to Stand;Stand to Sit    Sit to Stand 4: Min guard    Stand to Sit 4: Min guard    Number of Reps Other reps (comment)   5 reps   Comments completed sit <> stand with BLE on airex pad. Increased CGA and verbal cues required,       Ambulation/Gait   Ambulation/Gait Yes    Ambulation/Gait Assistance 5: Supervision    Ambulation/Gait Assistance Details completed gait around therapy gym    Assistive device None    Gait Pattern Step-through pattern    Ambulation Surface Level;Outdoor      High Level Balance   High Level Balance Activities Backward walking;Marching forwards;Head turns    High Level Balance Comments In hallway completed ambulation working on backwards walking, horizontal head turns, and marching forwards. Patient able to complete x 1 lap of each of the following.  Then progressed to completing with addition of dual task includign counting backwards by 2's, naming vegetables/fruits, and animals. Increased dificulty noted patient often stopping to complete dual task, or naming animal when asked for fruit/vegetable. CGA required throughout with completion.                Balance Exercises - 05/21/20 0001      Balance Exercises: Standing   Standing Eyes Opened Narrow base of support (BOS);Head turns;Foam/compliant surface;Limitations    Standing Eyes Opened Limitations with narrow BOS completed static standing without UE support, increase sway noted. completed horizontal/vertical head turns 2 x 10 reps each..    Standing Eyes Closed Wide (BOA);3 reps;30 secs;Limitations;Foam/compliant surface;Head turns;Solid surface    Standing Eyes Closed Limitations completed eyes closed 3 x 30 seconds, increased posterior sway noted today. require intermittent min A from PT and UE support to maintain balance.     SLS with Vectors Foam/compliant surface;Intermittent upper extremity assist;Limitations    SLS with Vectors Limitations wide BOS on airex - completing alteranting toe taps forward x 10 reps each dierction. progressed to completing crossover toe taps x 10 reps. Increased CGA required with completion.     Sidestepping Foam/compliant support;Limitations;4 reps    Sidestepping Limitations completed side stepping x 4 reps down and back. intermittent CGA required due to imbalance. verbal cues required for upright posture and foot placement.                PT Short Term Goals - 05/02/20 1327      PT SHORT TERM GOAL #1   Title Patient will be independent with balance/strengthening HEP (All STGS Due: 05/03/20)    Baseline Patient reports independence    Time 3    Period Weeks    Status Achieved    Target Date 05/03/20      PT SHORT TERM GOAL #2   Title Patient improve FGA to >/= 19/30 to demonstrate improved balance and reduced fall risk    Baseline  17/30, 20/30    Time 3    Period Weeks    Status Achieved      PT SHORT TERM GOAL #3   Title Patient will be educated on fall prevention/strategies to further reduce fall risk    Baseline Educated on fall prevention; Handout Provided    Time 3    Period Weeks    Status Achieved             PT Long Term Goals - 04/12/20 1306  PT LONG TERM GOAL #1   Title Patient will be independent with with final balance/strengthening HEP (All LTGs Due: 05/24/20)    Baseline no HEP established    Time 6    Period Weeks    Status New    Target Date 05/24/20      PT LONG TERM GOAL #2   Title Patient will improve FGA to >/= 23/30 to demonstrate reduced fall risk.    Baseline 19/30    Time 6    Period Weeks    Status New      PT LONG TERM GOAL #3   Title Patient will demo ability to complete situation 3 for 30 seconds, and situation 4 at least 20 seconds.    Baseline 23 secs on Situation 3, <5 seconds on Situation 4    Time 6    Period Weeks    Status New      PT LONG TERM GOAL #4   Title Patient will be able to complete 5x sit <> stand without UE support in </= 10 seconds to demonstrate improved balance    Baseline 12.1 secs    Time 6    Period Weeks    Status New      PT LONG TERM GOAL #5   Title Patient will improve TUG to </= 10 secs to demonstrate improved functional mobility and reduced fall risk    Baseline 12.16    Time 6    Period Weeks    Status New                 Plan - 05/21/20 1342    Clinical Impression Statement Continued progression of high level balance exercsies today. Patient continues to demo increased challenge with dual tasking, often priotizing cognitive task over manual task. Patient also require increased Min A for sway with vision removed on complaint surfaces. Will continue to progress toward LTGs.    Personal Factors and Comorbidities Comorbidity 3+    Comorbidities pAF on Eliquis, chronic pain on oxycodone and Ambien, previous SDH, COPD  on home O2 noct, HTN, hypothyroidism, OSA on CPAP, and DM    Examination-Activity Limitations Locomotion Level;Stairs    Examination-Participation Restrictions Community Activity;Yard Work    [employer] officer Evolving/Moderate complexity    Rehab Potential Good    PT Frequency 2x / week    PT Duration 6 weeks    PT Treatment/Interventions ADLs/Self Care Home Management;Electrical Stimulation;DME Instruction;Stair training;Functional mobility training;Gait training;Therapeutic activities;Therapeutic exercise;Balance training;Neuromuscular re-education;Patient/family education;Vestibular    PT Next Visit Plan continue balance exercsies (complaint surfaces), gait/balance with dual tasking/cognitive challenge, tandem walking. strengtening exercises. SciFit    Consulted and Agree with Plan of Care Patient           Patient will benefit from skilled therapeutic intervention in order to improve the following deficits and impairments:  Decreased balance, Decreased strength, Pain, Impaired sensation, Difficulty walking, Decreased endurance, Abnormal gait  Visit Diagnosis: Muscle weakness (generalized)  Other abnormalities of gait and mobility  Unsteadiness on feet     Problem List Patient Active Problem List   Diagnosis Date Noted  . Recurrent falls 03/25/2020  . SDH (subdural hematoma) ([facility]) 03/21/2020  . Intracranial hemorrhage ([facility]) 03/14/2020  . Fall at home, subsequent encounter 03/14/2020  . Leukocytosis 03/14/2020  . Nausea 09/13/2019  . Rotator cuff arthropathy of left shoulder 07/21/2019  . Dupuytren's disease of palm 07/12/2019  . Sebaceous cyst 06/27/2019  . COVID-19 05/08/2019  . Acute encephalopathy 05/08/2019  .  Obstructive sleep apnea   . Asymmetrical left sensorineural hearing loss 07/12/2018  . Chronic anticoagulation 12/21/2017  . Compression fracture of thoracic vertebra ([facility]) 09/21/2017  . Degeneration of thoracic intervertebral disc  09/21/2017  . Thoracic back pain 08/10/2017  . Degenerative joint disease of hand 07/14/2017  . Osteoarthritis   . Hypoxia   . Fibromyalgia   . Fatty tumor   . Cataract   . BPH (benign prostatic hyperplasia)   . Chronic atrial fibrillation ([facility])   . Insomnia 03/10/2017  . Chronic pain, legs and back 03/10/2017  . Hyperlipidemia 03/10/2017  . Obesity (BMI 30.0-34.9) 12/08/2016  . Depression 09/07/2016  . Spondylolisthesis 09/07/2016  . Hypothyroidism 03/08/2016  . GERD (gastroesophageal reflux disease) 03/08/2016  . Gout 03/08/2016  . Glaucoma 03/08/2016  . Type 2 diabetes mellitus ([facility]) 03/08/2016  . Hereditary and idiopathic peripheral neuropathy 08/28/2015  . Essential hypertension 12/04/2011  . Allergic rhinitis, seasonal 12/04/2011  . COPD (chronic obstructive pulmonary disease) ([facility]) 07/18/2011    Jones Bales, PT, DPT 05/21/2020, 1:44 PM  [facility] [address] [place], [name], [address] Phone: [phone]   Fax:  [phone]  Name: Jonathan [name] MRN: [id number] Date of Birth: July 20, 1932

## 2020-05-23 ENCOUNTER — Encounter: Payer: Self-pay | Admitting: Physical Therapy

## 2020-05-23 ENCOUNTER — Other Ambulatory Visit: Payer: Self-pay

## 2020-05-23 ENCOUNTER — Ambulatory Visit: Payer: Medicare Other | Admitting: Physical Therapy

## 2020-05-23 DIAGNOSIS — R2689 Other abnormalities of gait and mobility: Secondary | ICD-10-CM

## 2020-05-23 DIAGNOSIS — M6281 Muscle weakness (generalized): Secondary | ICD-10-CM | POA: Diagnosis not present

## 2020-05-23 DIAGNOSIS — R2681 Unsteadiness on feet: Secondary | ICD-10-CM

## 2020-05-23 NOTE — Therapy (Signed)
[facility] [address] [place], [name], [address] Phone: [phone]   Fax:  [phone]  Physical Therapy Treatment  Patient Details  Name: Jonathan [name] MRN: [id number] Date of Birth: [birth year] Referring Provider (PT): Billey Gosling, MD   Encounter Date: 05/23/2020   PT End of Session - 05/23/20 1220    Visit Number 9    Number of Visits 13    Date for PT Re-Evaluation 06/11/20   POC for 6 weeks, Cert for 60 days   Authorization Type Medicare (10th Visit PN)    Progress Note Due on Visit 10    PT Start Time 1104    PT Stop Time 1145    PT Time Calculation (min) 41 min    Equipment Utilized During Treatment Gait belt    Activity Tolerance Patient tolerated treatment well    Behavior During Therapy WFL for tasks assessed/performed           Past Medical History:  Diagnosis Date  . Atrial fibrillation (HCC)    a. paroxysmal, on Eliquis for anticoagulation  . BPH (benign prostatic hyperplasia)   . Bronchitis   . Cataract   . COPD (chronic obstructive pulmonary disease) (HCC)    2 liters O2 HS  . Depression   . Fatty tumor    waste and back  . Fibromyalgia   . GERD (gastroesophageal reflux disease)   . Glaucoma    bilateral eyes  . Gout   . Hereditary and idiopathic peripheral neuropathy 08/28/2015  . Hypertension   . Hypothyroidism   . Hypoxia   . Insomnia   . Osteoarthritis   . Osteoporosis   . Peripheral neuropathy   . Sleep apnea    uses cpap-add oxygen at night  . Spinal compression fracture (HCC) seventh vertebre  . Transient alteration of awareness 08/28/2015  . Type 2 diabetes mellitus ([facility]) 03/08/2016    Past Surgical History:  Procedure Laterality Date  . APPENDECTOMY  age 27  . CARPAL TUNNEL RELEASE Right    early 2000s  . CATARACT EXTRACTION     bilateral  . CHOLECYSTECTOMY  age 64  . EYE SURGERY    . FOOT ARTHRODESIS Right 02/02/2013   Procedure: RIGHT HALLUX METATARSAL  PHALANGEAL JOINT ARTHRODESIS ;  Surgeon: Wylene Simmer, MD;  Location: [facility];  Service: Orthopedics;  Laterality: Right;  . INGUINAL HERNIA REPAIR  age 64   rt side  . NASAL CONCHA BULLOSA RESECTION  age 63  . PROSTATE SURGERY    . SHOULDER ARTHROSCOPY W/ ROTATOR CUFF REPAIR Right    early 2000s  . tonsil    . VASECTOMY  age 61    There were no vitals filed for this visit.   Subjective Assessment - 05/23/20 1107    Subjective Has been having a lot of pain in L knee and L hip. Does not recall discussing D/C this visit - still wants to continue therapy. Still notices some staggering.    Pertinent History pAF on Eliquis, chronic pain on oxycodone, previous SDH, COPD on home O2 noct, HTN, hypothyroidism, OSA on CPAP, and DM, Neuropathy    Limitations Standing;Walking    Patient Stated Goals Improve the Balance; Improve Core Strength    Currently in Pain? Yes    Pain Score 6     Pain Location Knee    Pain Orientation Left    Pain Descriptors / Indicators Aching    Pain Type Acute pain  Pain Onset Yesterday    Aggravating Factors  walking, bending over to pick up objects, standing    Pain Relieving Factors applying heat    Pain Onset Yesterday              [facility] PT Assessment - 05/23/20 1114      Transfers   Five time sit to stand comments  14.32 seconds without UE support      Timed Up and Go Test   Normal TUG (seconds) 11.69      Functional Gait  Assessment   Gait assessed  Yes    Gait Level Surface Walks 20 ft in less than 7 sec but greater than 5.5 sec, uses assistive device, slower speed, mild gait deviations, or deviates 6-10 in outside of the 12 in walkway width.   6.84   Change in Gait Speed Able to smoothly change walking speed without loss of balance or gait deviation. Deviate no more than 6 in outside of the 12 in walkway width.    Gait with Horizontal Head Turns Performs head turns smoothly with no change in gait. Deviates no more than 6 in  outside 12 in walkway width    Gait with Vertical Head Turns Performs head turns with no change in gait. Deviates no more than 6 in outside 12 in walkway width.    Gait and Pivot Turn Pivot turns safely within 3 sec and stops quickly with no loss of balance.    Step Over Obstacle Is able to step over 2 stacked shoe boxes taped together (9 in total height) without changing gait speed. No evidence of imbalance.    Gait with Narrow Base of Support Ambulates less than 4 steps heel to toe or cannot perform without assistance.    Gait with Eyes Closed Walks 20 ft, slow speed, abnormal gait pattern, evidence for imbalance, deviates 10-15 in outside 12 in walkway width. Requires more than 9 sec to ambulate 20 ft.   9.5 seconds   Ambulating Backwards Walks 20 ft, uses assistive device, slower speed, mild gait deviations, deviates 6-10 in outside 12 in walkway width.    Steps Alternating feet, must use rail.    Total Score 22    FGA comment: 22/30 = medium fall risk                         [facility] Adult PT Treatment/Exercise - 05/23/20 1114      Ambulation/Gait   Stairs Yes    Stairs Assistance 5: Supervision    Stairs Assistance Details (indicate cue type and reason) pt with one episode of L knee instability when ascending stairs due to L knee pain, pt able to maintain balance due to pt holding onto staircase    Stair Management Technique One rail Right;Two rails;Alternating pattern;Forwards    Number of Stairs 4    Height of Stairs 6      Therapeutic Activites    Therapeutic Activities Other Therapeutic Activities    Other Therapeutic Activities discussed with pt that plan from primary PT was to assess goals and D/C today, pt reports that he does not recall that conversation and would still like to continue with PT, discussed with pt will begin to check LTGs and pt can discuss with his primary therapist when he sees her next session, pt in agreement. Due to pt's incr L knee and hip pain  and that currently being a limited factor in his balance, discussed with  pt to follow up with PCP or orthopedic doctor regarding his knee. due to decr balance with vision removed discussed with pt importance of making sure that he has adequate light sources when getting up at night in order to decr fall risk               Balance Exercises - 05/23/20 0001      Balance Exercises: Standing   Standing Eyes Opened Narrow base of support (BOS);Head turns;Foam/compliant surface;Limitations    Standing Eyes Opened Limitations on blue air ex: narrow BOS x10 reps head turns, x10 reps head turns    Standing Eyes Closed Wide (BOA);3 reps;30 secs;Limitations;Foam/compliant surface    Standing Eyes Closed Limitations on blue air ex: intermittent touch to the walls for balance    SLS with Vectors Foam/compliant surface;Intermittent upper extremity assist;Limitations    SLS with Vectors Limitations on blue air ex: alternating toe taps to 6" step x10 reps B and then progressing to x10 reps B to 2nd step with min guard as needed for balance    Other Standing Exercises Comments on blue mat: 3 reps SLS marching forwards and x3 reps marching backwards, cues for slowed and controlled, min guard for balance             PT Education - 05/23/20 1220    Education Details see TA. progress towards goals.    Person(s) Educated Patient    Methods Explanation    Comprehension Verbalized understanding            PT Short Term Goals - 05/02/20 1327      PT SHORT TERM GOAL #1   Title Patient will be independent with balance/strengthening HEP (All STGS Due: 05/03/20)    Baseline Patient reports independence    Time 3    Period Weeks    Status Achieved    Target Date 05/03/20      PT SHORT TERM GOAL #2   Title Patient improve FGA to >/= 19/30 to demonstrate improved balance and reduced fall risk    Baseline 17/30, 20/30    Time 3    Period Weeks    Status Achieved      PT SHORT TERM GOAL #3   Title  Patient will be educated on fall prevention/strategies to further reduce fall risk    Baseline Educated on fall prevention; Handout Provided    Time 3    Period Weeks    Status Achieved             PT Long Term Goals - 05/23/20 1114      PT LONG TERM GOAL #1   Title Patient will be independent with with final balance/strengthening HEP (All LTGs Due: 05/24/20)    Baseline no HEP established    Time 6    Period Weeks    Status New      PT LONG TERM GOAL #2   Title Patient will improve FGA to >/= 23/30 to demonstrate reduced fall risk.    Baseline 19/30, 22/30 on 05/23/20    Time 6    Period Weeks    Status Not Met      PT LONG TERM GOAL #3   Title Patient will demo ability to complete situation 3 for 30 seconds, and situation 4 at least 20 seconds.    Baseline 23 secs on Situation 3, <5 seconds on Situation 4    Time 6    Period Weeks    Status --  PT LONG TERM GOAL #4   Title Patient will be able to complete 5x sit <> stand without UE support in </= 10 seconds to demonstrate improved balance    Baseline 12.1 secs, 14.32 seconds without UE support on 05/23/20    Time 6    Period Weeks    Status Not Met      PT LONG TERM GOAL #5   Title Patient will improve TUG to </= 10 secs to demonstrate improved functional mobility and reduced fall risk    Baseline 12.16, 11.69 seconds    Time 6    Period Weeks    Status Not Met               Plan - 05/23/20 1230    Clinical Impression Statement Primary PT's plan was to potentially wrap up today after assessing LTGs due to potential plateau and decr carryover due to cognition. Pt did not remember this conversation and reported he would like to continue therapy. Will defer this conversation to primary PT and pt due to primary PT being out today. Began to check pt's LTGs with pt not meeting LTG #2, and 4 and 5. PT with improvement of TUG to 11.69 seconds but not quite to goal level. Pt also with improvement of FGA to 22/30,  but not quite to goal level of 23/30, indicating pt is still at a moderate fall risk. Will continue checking LTGs at next session and decide continuing PT vs. D/C.    Personal Factors and Comorbidities Comorbidity 3+    Comorbidities pAF on Eliquis, chronic pain on oxycodone and Ambien, previous SDH, COPD on home O2 noct, HTN, hypothyroidism, OSA on CPAP, and DM    Examination-Activity Limitations Locomotion Level;Stairs    Examination-Participation Restrictions Community Activity;Yard Work    [employer]chant [employer] officer Evolving/Moderate complexity    Rehab Potential Good    PT Frequency 2x / week    PT Duration 6 weeks    PT Treatment/Interventions ADLs/Self Care Home Management;Electrical Stimulation;DME Instruction;Stair training;Functional mobility training;Gait training;Therapeutic activities;Therapeutic exercise;Balance training;Neuromuscular re-education;Patient/family education;Vestibular    PT Next Visit Plan check remainder of LTGs, continue therapy vs. D/C? continue balance exercsies (complaint surfaces), gait/balance with dual tasking/cognitive challenge, tandem walking. strengtening exercises. SciFit    Consulted and Agree with Plan of Care Patient           Patient will benefit from skilled therapeutic intervention in order to improve the following deficits and impairments:  Decreased balance,Decreased strength,Pain,Impaired sensation,Difficulty walking,Decreased endurance,Abnormal gait  Visit Diagnosis: Muscle weakness (generalized)  Other abnormalities of gait and mobility  Unsteadiness on feet     Problem List Patient Active Problem List   Diagnosis Date Noted  . Recurrent falls 03/25/2020  . SDH (subdural hematoma) ([facility]) 03/21/2020  . Intracranial hemorrhage ([facility]) 03/14/2020  . Fall at home, subsequent encounter 03/14/2020  . Leukocytosis 03/14/2020  . Nausea 09/13/2019  . Rotator cuff arthropathy of left shoulder 07/21/2019  . Dupuytren's disease of  palm 07/12/2019  . Sebaceous cyst 06/27/2019  . COVID-19 05/08/2019  . Acute encephalopathy 05/08/2019  . Obstructive sleep apnea   . Asymmetrical left sensorineural hearing loss 07/12/2018  . Chronic anticoagulation 12/21/2017  . Compression fracture of thoracic vertebra ([facility]) 09/21/2017  . Degeneration of thoracic intervertebral disc 09/21/2017  . Thoracic back pain 08/10/2017  . Degenerative joint disease of hand 07/14/2017  . Osteoarthritis   . Hypoxia   . Fibromyalgia   . Fatty tumor   . Cataract   .  BPH (benign prostatic hyperplasia)   . Chronic atrial fibrillation ([place])   . Insomnia 03/10/2017  . Chronic pain, legs and back 03/10/2017  . Hyperlipidemia 03/10/2017  . Obesity (BMI 30.0-34.9) 12/08/2016  . Depression 09/07/2016  . Spondylolisthesis 09/07/2016  . Hypothyroidism 03/08/2016  . GERD (gastroesophageal reflux disease) 03/08/2016  . Gout 03/08/2016  . Glaucoma 03/08/2016  . Type 2 diabetes mellitus ([place]) 03/08/2016  . Hereditary and idiopathic peripheral neuropathy 08/28/2015  . Essential hypertension 12/04/2011  . Allergic rhinitis, seasonal 12/04/2011  . COPD (chronic obstructive pulmonary disease) ([facility]) 07/18/2011    Arliss Journey, PT, DPT  05/23/2020, 12:31 PM  [facility] [address] [place], [name], [address] Phone: [phone]   Fax:  [phone]  Name: Amer Alcindor MRN: [id number] Date of Birth: 1932/06/24

## 2020-05-24 ENCOUNTER — Encounter: Payer: Self-pay | Admitting: Internal Medicine

## 2020-05-27 ENCOUNTER — Encounter: Payer: Self-pay | Admitting: Internal Medicine

## 2020-05-27 NOTE — Telephone Encounter (Signed)
Patient called and was wondering if Dr. Quay Burow could call in [facility] for his panic attacks. As discussed in a previous conversation. He said that it could be sent to [facility] [address] [place]

## 2020-05-28 ENCOUNTER — Ambulatory Visit: Payer: Medicare Other

## 2020-05-28 ENCOUNTER — Other Ambulatory Visit: Payer: Self-pay

## 2020-05-28 DIAGNOSIS — M6281 Muscle weakness (generalized): Secondary | ICD-10-CM

## 2020-05-28 DIAGNOSIS — R2681 Unsteadiness on feet: Secondary | ICD-10-CM

## 2020-05-28 DIAGNOSIS — R2689 Other abnormalities of gait and mobility: Secondary | ICD-10-CM

## 2020-05-28 MED ORDER — BUSPIRONE HCL 5 MG PO TABS: 5.0000 mg | ORAL_TABLET | Freq: Three times a day (TID) | ORAL | 2 refills | Status: DC

## 2020-05-28 NOTE — Patient Instructions (Signed)
Access Code: [email or web address] URL: [email or web address] Date: 05/28/2020 Prepared by: Baldomero Lamy  Exercises Standing Balance with Eyes Closed on Foam - 1 x daily - 5 x weekly - 1 sets - 3 reps - 30 hold Romberg Stance on Foam Pad with Head Rotation - 1 x daily - 5 x weekly - 2 sets - 10 reps Romberg Stance with Head Nods on Foam Pad - 1 x daily - 5 x weekly - 2 sets - 10 reps Standing Romberg to 3/4 Tandem Stance - 1 x daily - 5 x weekly - 1 sets - 3 reps - 30 hold Standing Single Leg Stance with Counter Support - 1 x daily - 5 x weekly - 1 sets - 3 reps - 15 hold Standing March with Counter Support - 1 x daily - 5 x weekly - 2 sets - 10 reps Standing Hip Abduction with Counter Support - 1 x daily - 5 x weekly - 2 sets - 10 reps Standing Hip Extension with Counter Support - 1 x daily - 5 x weekly - 2 sets - 10 reps  Patient Education Falls at [place]

## 2020-05-28 NOTE — Addendum Note (Signed)
Addended by: Binnie Rail on: 05/28/2020 07:52 AM   Modules accepted: Orders

## 2020-05-28 NOTE — Therapy (Signed)
[facility] [address] [place], [name], [address] Phone: [phone]   Fax:  [phone]  Physical Therapy Treatment/Re-Certification/Progress Note  Patient Details  Name: Jonathan [name] MRN: [id number] Date of Birth: 31-Jan-1933 Referring Provider (PT): Billey Gosling, MD  Physical Therapy Progress Note   Dates of Reporting Period: 04/12/20 - 05/28/20  See Note below for Objective Data and Assessment of Progress/Goals.  Thank you for the referral of this patient. Guillermina [place], PT, DPT  Encounter Date: 05/28/2020   PT End of Session - 05/28/20 1022    Visit Number 10    Number of Visits 18    Date for PT Re-Evaluation 07/27/20   POC for 4 weeks, Cert for 60 days   Authorization Type Medicare (10th Visit PN)    Progress Note Due on Visit 68    PT Start Time 1017    PT Stop Time 1100    PT Time Calculation (min) 43 min    Equipment Utilized During Treatment Gait belt    Activity Tolerance Patient tolerated treatment well    Behavior During Therapy WFL for tasks assessed/performed           Past Medical History:  Diagnosis Date   Atrial fibrillation ([place])    a. paroxysmal, on Eliquis for anticoagulation   BPH (benign prostatic hyperplasia)    Bronchitis    Cataract    COPD (chronic obstructive pulmonary disease) (HCC)    2 liters O2 HS   Depression    Fatty tumor    waste and back   Fibromyalgia    GERD (gastroesophageal reflux disease)    Glaucoma    bilateral eyes   Gout    Hereditary and idiopathic peripheral neuropathy 08/28/2015   Hypertension    Hypothyroidism    Hypoxia    Insomnia    Osteoarthritis    Osteoporosis    Peripheral neuropathy    Sleep apnea    uses cpap-add oxygen at night   Spinal compression fracture (HCC) seventh vertebre   Transient alteration of awareness 08/28/2015   Type 2 diabetes mellitus ([place]) 03/08/2016    Past Surgical History:   Procedure Laterality Date   APPENDECTOMY  age 59   CARPAL TUNNEL RELEASE Right    early 2000s   CATARACT EXTRACTION     bilateral   CHOLECYSTECTOMY  age 83   EYE SURGERY     FOOT ARTHRODESIS Right 02/02/2013   Procedure: RIGHT HALLUX METATARSAL PHALANGEAL JOINT ARTHRODESIS ;  Surgeon: Wylene Simmer, MD;  Location: [facility];  Service: Orthopedics;  Laterality: Right;   INGUINAL HERNIA REPAIR  age 49   rt side   NASAL CONCHA BULLOSA RESECTION  age 68   PROSTATE SURGERY     SHOULDER ARTHROSCOPY W/ ROTATOR CUFF REPAIR Right    early 2000s   tonsil     VASECTOMY  age 22    There were no vitals filed for this visit.   Subjective Assessment - 05/28/20 1020    Subjective Patient repotrs that has been having panic attacks for the last week, they are occuring in the middle of the attack. Has reached out to MD, and they have prescribed medication but patient has not recieved medication yet. No falls.    Pertinent History pAF on Eliquis, chronic pain on oxycodone, previous SDH, COPD on home O2 noct, HTN, hypothyroidism, OSA on CPAP, and DM, Neuropathy    Limitations Standing;Walking    Patient Stated Goals Improve the  Balance; Improve Core Strength    Currently in Pain? Yes    Pain Score 6     Pain Location Back    Pain Orientation Mid    Pain Descriptors / Indicators Aching    Pain Type Acute pain    Pain Onset Yesterday    Pain Frequency Intermittent    Pain Onset Yesterday               [facility] Adult PT Treatment/Exercise - 05/28/20 1024      Transfers   Transfers Sit to Stand;Stand to Sit    Sit to Stand 5: Supervision    Five time sit to stand comments  9.79 secs without UE support    Stand to Sit 5: Supervision      Ambulation/Gait   Ambulation/Gait Yes    Ambulation/Gait Assistance 5: Supervision    Ambulation/Gait Assistance Details throughout therapy gym with activities    Assistive device None    Gait Pattern Step-through pattern     Ambulation Surface Level;Indoor      Neuro Re-ed    Neuro Re-ed Details  Completed M-CTSIB. Patient able to hold situation 1-3 for full 30 seconds; situation 4 for 6 seconds. increased balance challenge with vision removed.      Exercises   Exercises Other Exercises    Other Exercises  Completed review of entire HEP to ensure proper completion/compliance.          Completed entire review of current HEP. PT educating on importance of compliance with current HEP as patient is only completing 2-3x /week and patient would like to see more improvements in balance. Patient also completing exercises that are not included in current HEP, PT providing new handout on exercises.    Access Code: [email or web address] URL: [email or web address] Date: 05/28/2020 Prepared by: Baldomero Lamy  Exercises Standing Balance with Eyes Closed on Foam - 1 x daily - 5 x weekly - 1 sets - 3 reps - 30 hold Romberg Stance on Foam Pad with Head Rotation - 1 x daily - 5 x weekly - 2 sets - 10 reps Romberg Stance with Head Nods on Foam Pad - 1 x daily - 5 x weekly - 2 sets - 10 reps Standing Romberg to 3/4 Tandem Stance - 1 x daily - 5 x weekly - 1 sets - 3 reps - 30 hold Standing Single Leg Stance with Counter Support - 1 x daily - 5 x weekly - 1 sets - 3 reps - 15 hold Standing March with Counter Support - 1 x daily - 5 x weekly - 2 sets - 10 reps Standing Hip Abduction with Counter Support - 1 x daily - 5 x weekly - 2 sets - 10 reps Standing Hip Extension with Counter Support - 1 x daily - 5 x weekly - 2 sets - 10 reps  Patient Education [facility] at [facility]       PT Education - 05/28/20 1059    Education Details progress toward LTG; Update/Review HEP    Person(s) Educated Patient    Methods Explanation;Demonstration;Handout    Comprehension Verbalized understanding;Returned demonstration;Need further instruction            PT Short Term Goals - 05/02/20 1327      PT SHORT TERM GOAL #1    Title Patient will be independent with balance/strengthening HEP (All STGS Due: 05/03/20)    Baseline Patient reports independence    Time 3    Period Weeks    Status  Achieved    Target Date 05/03/20      PT SHORT TERM GOAL #2   Title Patient improve FGA to >/= 19/30 to demonstrate improved balance and reduced fall risk    Baseline 17/30, 20/30    Time 3    Period Weeks    Status Achieved      PT SHORT TERM GOAL #3   Title Patient will be educated on fall prevention/strategies to further reduce fall risk    Baseline Educated on fall prevention; Handout Provided    Time 3    Period Weeks    Status Achieved             PT Long Term Goals - 05/28/20 1029      PT LONG TERM GOAL #1   Title Patient will be independent with with final balance/strengthening HEP (All LTGs Due: 05/24/20)    Baseline continue to progress HEP, completing 2-3 days/week    Time 6    Period Weeks    Status On-going      PT LONG TERM GOAL #2   Title Patient will improve FGA to >/= 23/30 to demonstrate reduced fall risk.    Baseline 19/30, 22/30 on 05/23/20    Time 6    Period Weeks    Status Not Met      PT LONG TERM GOAL #3   Title Patient will demo ability to complete situation 3 for 30 seconds, and situation 4 at least 20 seconds.    Baseline 23 secs on Situation 3, <5 seconds on Situation 4; 30 seconds on situation 3, 6 seconds on situation 4    Time 6    Period Weeks    Status Partially Met      PT LONG TERM GOAL #4   Title Patient will be able to complete 5x sit <> stand without UE support in </= 10 seconds to demonstrate improved balance    Baseline 12.1 secs, 14.32 seconds without UE support on 05/23/20; 9.79 secs without UE support    Time 6    Period Weeks    Status Achieved      PT LONG TERM GOAL #5   Title Patient will improve TUG to </= 10 secs to demonstrate improved functional mobility and reduced fall risk    Baseline 12.16, 11.69 seconds    Time 6    Period Weeks    Status  Not Met           Updated Short Term Goals:   PT Short Term Goals - 05/28/20 1350      PT SHORT TERM GOAL #1   Title = LTGs            Updated Long Term Goals:     PT Long Term Goals - 05/28/20 1350      PT LONG TERM GOAL #1   Title Patient will be independent with final balance/strengthening HEP (All LTGs Due: 06/28/20)    Baseline continue to progress HEP, completing 2-3 days/week    Time 4    Period Weeks    Status Revised    Target Date 06/28/20      PT LONG TERM GOAL #2   Title Patient will improve FGA to >/= 24/30 to demonstrate reduced fall risk.    Baseline 19/30, 22/30 on 05/23/20    Time 4    Period Weeks    Status Revised      PT LONG TERM GOAL #3   Title Patient  will demo ability to complete situation 4 of M-CTSIB for >/= 20 seconds    Baseline 6 seconds on situation 4    Time 4    Period Weeks    Status Revised      PT LONG TERM GOAL #4   Title Patient will improve TUG to </= 10 secs to demonstrate improved functional mobility and reduced fall risk    Baseline 12.16, 11.69 seconds    Time 4    Period Weeks    Status Revised      PT LONG TERM GOAL #5   Title --    Baseline --    Time --    Period --    Status --               Plan - 05/28/20 1349    Clinical Impression Statement Finished assessing patients progress toward LTGs. Patient able to meet LTG #4, and partially meet LTG #3, patient is demonstrating progress toward all other LTG today. Rest of session spent reviewing and updating current HEP and educating patient on compliance to allow for further improvements. Patient is currently completing exercises not included on HEP and not compliant with recommended exercise completion. However, patient has demonstrated improved balance and will benefit from continued skilled PT services to progress toward all unmet goals.    Personal Factors and Comorbidities Comorbidity 3+    Comorbidities pAF on Eliquis, chronic pain on oxycodone and  Ambien, previous SDH, COPD on home O2 noct, HTN, hypothyroidism, OSA on CPAP, and DM    Examination-Activity Limitations Locomotion Level;Stairs    Examination-Participation Restrictions Community Activity;Yard Work    [employer] officer Evolving/Moderate complexity    Rehab Potential Good    PT Frequency 2x / week    PT Duration 4 weeks    PT Treatment/Interventions ADLs/Self Care Home Management;Electrical Stimulation;DME Instruction;Stair training;Functional mobility training;Gait training;Therapeutic activities;Therapeutic exercise;Balance training;Neuromuscular re-education;Patient/family education;Vestibular    PT Next Visit Plan how was HEP update? continue balance exercsies (complaint surfaces), gait/balance with dual tasking/cognitive challenge, tandem walking. strengtening exercises. SciFit    Consulted and Agree with Plan of Care Patient           Patient will benefit from skilled therapeutic intervention in order to improve the following deficits and impairments:  Decreased balance,Decreased strength,Pain,Impaired sensation,Difficulty walking,Decreased endurance,Abnormal gait  Visit Diagnosis: Muscle weakness (generalized)  Other abnormalities of gait and mobility  Unsteadiness on feet     Problem List Patient Active Problem List   Diagnosis Date Noted   Recurrent falls 03/25/2020   SDH (subdural hematoma) ([place]) 03/21/2020   Intracranial hemorrhage ([place]) 03/14/2020   Fall at home, subsequent encounter 03/14/2020   Leukocytosis 03/14/2020   Nausea 09/13/2019   Rotator cuff arthropathy of left shoulder 07/21/2019   Dupuytren's disease of palm 07/12/2019   Sebaceous cyst 06/27/2019   COVID-19 05/08/2019   Acute encephalopathy 05/08/2019   Obstructive sleep apnea    Asymmetrical left sensorineural hearing loss 07/12/2018   Chronic anticoagulation 12/21/2017   Compression fracture of thoracic vertebra (HCC) 09/21/2017   Degeneration  of thoracic intervertebral disc 09/21/2017   Thoracic back pain 08/10/2017   Degenerative joint disease of hand 07/14/2017   Osteoarthritis    Hypoxia    Fibromyalgia    Fatty tumor    Cataract    BPH (benign prostatic hyperplasia)    Chronic atrial fibrillation ([place])    Insomnia 03/10/2017   Chronic pain, legs and back 03/10/2017   Hyperlipidemia 03/10/2017  Obesity (BMI 30.0-34.9) 12/08/2016   Depression 09/07/2016   Spondylolisthesis 09/07/2016   Hypothyroidism 03/08/2016   GERD (gastroesophageal reflux disease) 03/08/2016   Gout 03/08/2016   Glaucoma 03/08/2016   Type 2 diabetes mellitus ([place]) 03/08/2016   Hereditary and idiopathic peripheral neuropathy 08/28/2015   Essential hypertension 12/04/2011   Allergic rhinitis, seasonal 12/04/2011   COPD (chronic obstructive pulmonary disease) ([place]) 07/18/2011    Jones Bales, PT, DPT 05/28/2020, 1:49 PM  [facility] [address] [place], [name], [address] Phone: [phone]   Fax:  [phone]  Name: Jonathan [name] MRN: [id number] Date of Birth: Aug 28, 1932

## 2020-06-03 DIAGNOSIS — I480 Paroxysmal atrial fibrillation: Secondary | ICD-10-CM | POA: Diagnosis not present

## 2020-06-05 ENCOUNTER — Ambulatory Visit: Payer: Medicare Other | Admitting: Physical Therapy

## 2020-06-05 ENCOUNTER — Other Ambulatory Visit: Payer: Self-pay

## 2020-06-05 ENCOUNTER — Encounter: Payer: Self-pay | Admitting: Physical Therapy

## 2020-06-05 DIAGNOSIS — R2681 Unsteadiness on feet: Secondary | ICD-10-CM | POA: Diagnosis not present

## 2020-06-05 DIAGNOSIS — M6281 Muscle weakness (generalized): Secondary | ICD-10-CM | POA: Diagnosis not present

## 2020-06-05 DIAGNOSIS — R2689 Other abnormalities of gait and mobility: Secondary | ICD-10-CM

## 2020-06-05 NOTE — Therapy (Signed)
[facility] [address] [place], [name], [address] Phone: [phone]   Fax:  [phone]  Physical Therapy Treatment  Patient Details  Name: Jonathan [name] MRN: [id number] Date of Birth: 1932-07-31 Referring Provider (PT): Billey Gosling, MD   Encounter Date: 06/05/2020   PT End of Session - 06/05/20 1017    Visit Number 11    Number of Visits 18    Date for PT Re-Evaluation 07/27/20   POC for 4 weeks, Cert for 60 days   Authorization Type Medicare (10th Visit PN)    Progress Note Due on Visit 20    PT Start Time 0933    PT Stop Time 1015    PT Time Calculation (min) 42 min    Equipment Utilized During Treatment Gait belt    Activity Tolerance Patient tolerated treatment well    Behavior During Therapy WFL for tasks assessed/performed           Past Medical History:  Diagnosis Date  . Atrial fibrillation (HCC)    a. paroxysmal, on Eliquis for anticoagulation  . BPH (benign prostatic hyperplasia)   . Bronchitis   . Cataract   . COPD (chronic obstructive pulmonary disease) (HCC)    2 liters O2 HS  . Depression   . Fatty tumor    waste and back  . Fibromyalgia   . GERD (gastroesophageal reflux disease)   . Glaucoma    bilateral eyes  . Gout   . Hereditary and idiopathic peripheral neuropathy 08/28/2015  . Hypertension   . Hypothyroidism   . Hypoxia   . Insomnia   . Osteoarthritis   . Osteoporosis   . Peripheral neuropathy   . Sleep apnea    uses cpap-add oxygen at night  . Spinal compression fracture (HCC) seventh vertebre  . Transient alteration of awareness 08/28/2015  . Type 2 diabetes mellitus ([place]) 03/08/2016    Past Surgical History:  Procedure Laterality Date  . APPENDECTOMY  age 66  . CARPAL TUNNEL RELEASE Right    early 2000s  . CATARACT EXTRACTION     bilateral  . CHOLECYSTECTOMY  age 3  . EYE SURGERY    . FOOT ARTHRODESIS Right 02/02/2013   Procedure: RIGHT HALLUX METATARSAL  PHALANGEAL JOINT ARTHRODESIS ;  Surgeon: Wylene Simmer, MD;  Location: [facility];  Service: Orthopedics;  Laterality: Right;  . INGUINAL HERNIA REPAIR  age 81   rt side  . NASAL CONCHA BULLOSA RESECTION  age 70  . PROSTATE SURGERY    . SHOULDER ARTHROSCOPY W/ ROTATOR CUFF REPAIR Right    early 2000s  . tonsil    . VASECTOMY  age 81    There were no vitals filed for this visit.   Subjective Assessment - 06/05/20 0936    Subjective Didn't sleep well last night. Wants to work on standing on one leg. No falls. Exercises have been going fair. Reports having some shooting pain in his right leg.    Pertinent History pAF on Eliquis, chronic pain on oxycodone, previous SDH, COPD on home O2 noct, HTN, hypothyroidism, OSA on CPAP, and DM, Neuropathy    Limitations Standing;Walking    Patient Stated Goals Improve the Balance; Improve Core Strength    Currently in Pain? Yes    Pain Score 6     Pain Location Back   L thigh   Pain Orientation Mid    Pain Descriptors / Indicators Aching    Pain Type Acute pain;Chronic pain  Pain Onset Yesterday    Aggravating Factors  "no idea"    Pain Relieving Factors "not sure"    Pain Onset [facility] Adult PT Treatment/Exercise - 06/05/20 0001      Self-Care   Self-Care Other Self-Care Comments    Other Self-Care Comments  pt reporting still having difficulty sleeping due to panic attacks even after being prescribed medication from his MD that he reports he has been taking. Pt also reports that when he is driving that places that used to look familiar are now beginning to look unfamiliar, reports that this has started recently. Discussed with pt importance of discussing this with his PCP as he has not yet told his MD. Pt reporting understanding               Balance Exercises - 06/05/20 0001      Balance Exercises: Standing   Standing Eyes Closed Wide (BOA);3 reps;30  secs;Limitations;Foam/compliant surface    Standing Eyes Closed Limitations on blue air ex: 2 x 30 seconds, with feet hip width distance 3 x15 seconds, wide BOS head turns 2 x 5 reps, head nods 2 x 5 reps with touch to chair for balance    SLS Eyes open;Upper extremity support 1;Limitations    SLS Limitations alternating cone taps with RUE support - x10 reps B, calling outorders of colors for pt to tap (calling out 2-3 colors)    SLS with Vectors Foam/compliant surface;Intermittent upper extremity assist;Limitations    SLS with Vectors Limitations on blue air ex: alternating toe taps to 6" step with UE support and then progressing to no UE support x20 reps, then stepping each foot on bottom step and holding for 15 seconds for modified SLS x2 reps no UE support, then with single UE support modified SLS 2 x 5 reps head turns    Tandem Gait Forward;Limitations    Tandem Gait Limitations 3 reps down and back at countertop with UE support on blue mat    Marching Upper extremity assist 1;Forwards;Limitations    Marching Limitations 3 reps down and back at countertop with UE support on blue mat    Other Standing Exercises Comments on blue air ex with UE support: alternating marching x10 reps, heel toe raises x10 reps               PT Short Term Goals - 05/28/20 1350      PT SHORT TERM GOAL #1   Title = LTGs             PT Long Term Goals - 05/28/20 1350      PT LONG TERM GOAL #1   Title Patient will be independent with final balance/strengthening HEP (All LTGs Due: 06/28/20)    Baseline continue to progress HEP, completing 2-3 days/week    Time 4    Period Weeks    Status Revised    Target Date 06/28/20      PT LONG TERM GOAL #2   Title Patient will improve FGA to >/= 24/30 to demonstrate reduced fall risk.    Baseline 19/30, 22/30 on 05/23/20    Time 4    Period Weeks    Status Revised      PT LONG TERM GOAL #3   Title Patient will demo ability to complete situation 4 of  M-CTSIB  for >/= 20 seconds    Baseline 6 seconds on situation 4    Time 4    Period Weeks    Status Revised      PT LONG TERM GOAL #4   Title Patient will improve TUG to </= 10 secs to demonstrate improved functional mobility and reduced fall risk    Baseline 12.16, 11.69 seconds    Time 4    Period Weeks    Status Revised      PT LONG TERM GOAL #5   Title --    Baseline --    Time --    Period --    Status --                 Plan - 06/05/20 1215    Clinical Impression Statement Focus of today's skilled session was standing balance strategies on compliant surfaces and SLS. Pt tolerated session well, needing frequent UE support esp with SLS activities and eyes closed on foam . Will continue to progress towards LTGs.    Personal Factors and Comorbidities Comorbidity 3+    Comorbidities pAF on Eliquis, chronic pain on oxycodone and Ambien, previous SDH, COPD on home O2 noct, HTN, hypothyroidism, OSA on CPAP, and DM    Examination-Activity Limitations Locomotion Level;Stairs    Examination-Participation Restrictions Community Activity;Yard Work    [employer] officer Evolving/Moderate complexity    Rehab Potential Good    PT Frequency 2x / week    PT Duration 4 weeks    PT Treatment/Interventions ADLs/Self Care Home Management;Electrical Stimulation;DME Instruction;Stair training;Functional mobility training;Gait training;Therapeutic activities;Therapeutic exercise;Balance training;Neuromuscular re-education;Patient/family education;Vestibular    PT Next Visit Plan how is HEP? SLS, continue balance exercsies (complaint surfaces), gait/balance with dual tasking/cognitive challenge, tandem walking. strengtening exercises. SciFit    Consulted and Agree with Plan of Care Patient           Patient will benefit from skilled therapeutic intervention in order to improve the following deficits and impairments:  Decreased balance,Decreased strength,Pain,Impaired  sensation,Difficulty walking,Decreased endurance,Abnormal gait  Visit Diagnosis: Unsteadiness on feet  Other abnormalities of gait and mobility  Muscle weakness (generalized)     Problem List Patient Active Problem List   Diagnosis Date Noted  . Recurrent falls 03/25/2020  . SDH (subdural hematoma) ([facility]) 03/21/2020  . Intracranial hemorrhage ([place]) 03/14/2020  . Fall at home, subsequent encounter 03/14/2020  . Leukocytosis 03/14/2020  . Nausea 09/13/2019  . Rotator cuff arthropathy of left shoulder 07/21/2019  . Dupuytren's disease of palm 07/12/2019  . Sebaceous cyst 06/27/2019  . COVID-19 05/08/2019  . Acute encephalopathy 05/08/2019  . Obstructive sleep apnea   . Asymmetrical left sensorineural hearing loss 07/12/2018  . Chronic anticoagulation 12/21/2017  . Compression fracture of thoracic vertebra ([place]) 09/21/2017  . Degeneration of thoracic intervertebral disc 09/21/2017  . Thoracic back pain 08/10/2017  . Degenerative joint disease of hand 07/14/2017  . Osteoarthritis   . Hypoxia   . Fibromyalgia   . Fatty tumor   . Cataract   . BPH (benign prostatic hyperplasia)   . Chronic atrial fibrillation ([facility])   . Insomnia 03/10/2017  . Chronic pain, legs and back 03/10/2017  . Hyperlipidemia 03/10/2017  . Obesity (BMI 30.0-34.9) 12/08/2016  . Depression 09/07/2016  . Spondylolisthesis 09/07/2016  . Hypothyroidism 03/08/2016  . GERD (gastroesophageal reflux disease) 03/08/2016  . Gout 03/08/2016  . Glaucoma 03/08/2016  . Type 2 diabetes mellitus ([place]) 03/08/2016  . Hereditary and idiopathic peripheral neuropathy 08/28/2015  . Essential hypertension  12/04/2011  . Allergic rhinitis, seasonal 12/04/2011  . COPD (chronic obstructive pulmonary disease) ([place]) 07/18/2011    Arliss Journey, PT, DPT  06/05/2020, 12:17 PM  [facility] [address] [place], [name], [address] Phone: [phone]   Fax:   [phone]  Name: Demon Volante MRN: [id number] Date of Birth: Oct 20, 1932

## 2020-06-06 ENCOUNTER — Telehealth: Payer: Medicare Other

## 2020-06-08 ENCOUNTER — Encounter: Payer: Self-pay | Admitting: Internal Medicine

## 2020-06-10 MED ORDER — HYDROCHLOROTHIAZIDE 25 MG PO TABS
25.0000 mg | ORAL_TABLET | Freq: Every day | ORAL | 3 refills | Status: DC
Start: 2020-06-10 — End: 2020-06-17

## 2020-06-11 ENCOUNTER — Other Ambulatory Visit: Payer: Self-pay

## 2020-06-11 ENCOUNTER — Encounter: Payer: Self-pay | Admitting: Physical Therapy

## 2020-06-11 ENCOUNTER — Ambulatory Visit: Payer: Medicare Other | Admitting: Physical Therapy

## 2020-06-11 VITALS — BP 128/76 | HR 84

## 2020-06-11 DIAGNOSIS — M6281 Muscle weakness (generalized): Secondary | ICD-10-CM | POA: Diagnosis not present

## 2020-06-11 DIAGNOSIS — R2681 Unsteadiness on feet: Secondary | ICD-10-CM | POA: Diagnosis not present

## 2020-06-11 DIAGNOSIS — R2689 Other abnormalities of gait and mobility: Secondary | ICD-10-CM | POA: Diagnosis not present

## 2020-06-11 NOTE — Therapy (Addendum)
[facility] [address] [place], [name], [address] Phone: [phone]   Fax:  [phone]  Physical Therapy Treatment  Patient Details  Name: Jonathan [name] MRN: [id number] Date of Birth: 1933/03/13 Referring Provider (PT): Billey Gosling, MD   Encounter Date: 06/11/2020   PT End of Session - 06/11/20 1302    Visit Number 12    Number of Visits 18    Date for PT Re-Evaluation 07/27/20   POC for 4 weeks, Cert for 60 days   Authorization Type Medicare (10th Visit PN)    Progress Note Due on Visit 20    PT Start Time 1015    PT Stop Time 1057    PT Time Calculation (min) 42 min    Equipment Utilized During Treatment Gait belt    Activity Tolerance Patient tolerated treatment well    Behavior During Therapy WFL for tasks assessed/performed           Past Medical History:  Diagnosis Date  . Atrial fibrillation (HCC)    a. paroxysmal, on Eliquis for anticoagulation  . BPH (benign prostatic hyperplasia)   . Bronchitis   . Cataract   . COPD (chronic obstructive pulmonary disease) (HCC)    2 liters O2 HS  . Depression   . Fatty tumor    waste and back  . Fibromyalgia   . GERD (gastroesophageal reflux disease)   . Glaucoma    bilateral eyes  . Gout   . Hereditary and idiopathic peripheral neuropathy 08/28/2015  . Hypertension   . Hypothyroidism   . Hypoxia   . Insomnia   . Osteoarthritis   . Osteoporosis   . Peripheral neuropathy   . Sleep apnea    uses cpap-add oxygen at night  . Spinal compression fracture (HCC) seventh vertebre  . Transient alteration of awareness 08/28/2015  . Type 2 diabetes mellitus ([facility]) 03/08/2016    Past Surgical History:  Procedure Laterality Date  . APPENDECTOMY  age 86  . CARPAL TUNNEL RELEASE Right    early 2000s  . CATARACT EXTRACTION     bilateral  . CHOLECYSTECTOMY  age 86  . EYE SURGERY    . FOOT ARTHRODESIS Right 02/02/2013   Procedure: RIGHT HALLUX METATARSAL  PHALANGEAL JOINT ARTHRODESIS ;  Surgeon: Wylene Simmer, MD;  Location: [facility];  Service: Orthopedics;  Laterality: Right;  . INGUINAL HERNIA REPAIR  age 86   rt side  . NASAL CONCHA BULLOSA RESECTION  age 86  . PROSTATE SURGERY    . SHOULDER ARTHROSCOPY W/ ROTATOR CUFF REPAIR Right    early 2000s  . tonsil    . VASECTOMY  age 86    Vitals:   06/11/20 1020  BP: 128/76  Pulse: 84  SpO2: 98%     Subjective Assessment - 06/11/20 1018    Subjective Been having panic attacks the past couple of days and has not been sleeping. Feeling better today. Wants to work on some exercises outside. Sees his doctor tomorrow and next monday. Has been falling asleep more sitting up. Has not been driving.    Pertinent History pAF on Eliquis, chronic pain on oxycodone, previous SDH, COPD on home O2 noct, HTN, hypothyroidism, OSA on CPAP, and DM, Neuropathy    Limitations Standing;Walking    Patient Stated Goals Improve the Balance; Improve Core Strength    Currently in Pain? Yes    Pain Score 6     Pain Location Back    Pain  Orientation Mid    Pain Descriptors / Indicators Aching    Pain Type Chronic pain    Pain Onset Yesterday    Aggravating Factors  "no idea"    Pain Relieving Factors "not sure"    Pain Onset Yesterday                             [facility] Adult PT Treatment/Exercise - 06/11/20 1049      Ambulation/Gait   Ambulation/Gait Yes    Ambulation/Gait Assistance 5: Supervision;4: Min guard    Ambulation/Gait Assistance Details dynamic gait tasks outdoors on grass - scanning environment (head turns and head nods) - pt with incr difficulty with head nods, quick start/stops, and retro gait in small bouts of 15-20'. and tossing ball x200' forwards gait with min guard/min A for balance. Additional distances on pavement with cues for incr foot clearance as pt scuffing his feet at times.    Ambulation Distance (Feet) 700 Feet    Assistive device None    Gait  Pattern Step-through pattern    Ambulation Surface Level;Unlevel;Indoor;Outdoor;Grass;Paved               Balance Exercises - 06/11/20 0001      Balance Exercises: Standing   Standing Eyes Closed Narrow base of support (BOS);Foam/compliant surface    Standing Eyes Closed Limitations standing on grass with feet approx. hip width distance 2 x 30 seconds, and 2 x 5 reps head turns, 2 x 5 reps head nods    SLS Eyes open    SLS Limitations outside alternating toe taps to curb x10 reps, and then modified SLS stance on curb x6 reps B with 5 second hold, no UE support               PT Short Term Goals - 05/28/20 1350      PT SHORT TERM GOAL #1   Title = LTGs             PT Long Term Goals - 05/28/20 1350      PT LONG TERM GOAL #1   Title Patient will be independent with final balance/strengthening HEP (All LTGs Due: 06/28/20)    Baseline continue to progress HEP, completing 2-3 days/week    Time 4    Period Weeks    Status Revised    Target Date 06/28/20      PT LONG TERM GOAL #2   Title Patient will improve FGA to >/= 24/30 to demonstrate reduced fall risk.    Baseline 19/30, 22/30 on 05/23/20    Time 4    Period Weeks    Status Revised      PT LONG TERM GOAL #3   Title Patient will demo ability to complete situation 4 of M-CTSIB for >/= 20 seconds    Baseline 6 seconds on situation 4    Time 4    Period Weeks    Status Revised      PT LONG TERM GOAL #4   Title Patient will improve TUG to </= 10 secs to demonstrate improved functional mobility and reduced fall risk    Baseline 12.16, 11.69 seconds    Time 4    Period Weeks    Status Revised      PT LONG TERM GOAL #5   Title --    Baseline --    Time --    Period --    Status --  Plan - 06/11/20 1304    Clinical Impression Statement Pt reporting having panic attacks the past couple of days. Is feeling better today and wants to participate in therapy today and is seeing his MD  tomorrow. Wishes to do exercises outside today due to reporting that he feels claustrophobic at times. Checked pt's vitals and WFL. Pt's O2 sats throughout session at 96-98% and HR at approx. 80-100 bpm after activities. Focused on balance and gait training outdoors on unlevel surfaces with min guard/min A at times for balance esp with head motions. Seated rest breaks taken throughout. Will continue to progress towards LTGs.    Personal Factors and Comorbidities Comorbidity 3+    Comorbidities pAF on Eliquis, chronic pain on oxycodone and Ambien, previous SDH, COPD on home O2 noct, HTN, hypothyroidism, OSA on CPAP, and DM    Examination-Activity Limitations Locomotion Level;Stairs    Examination-Participation Restrictions Community Activity;Yard Work    Conservation officer, historic buildings Evolving/Moderate complexity    Rehab Potential Good    PT Frequency 2x / week    PT Duration 4 weeks    PT Treatment/Interventions ADLs/Self Care Home Management;Electrical Stimulation;DME Instruction;Stair training;Functional mobility training;Gait training;Therapeutic activities;Therapeutic exercise;Balance training;Neuromuscular re-education;Patient/family education;Vestibular    PT Next Visit Plan how was MD appt? how is HEP? SLS, continue balance exercsies (complaint surfaces), gait/balance with dual tasking/cognitive challenge, tandem walking. strengtening exercises. SciFit    Consulted and Agree with Plan of Care Patient           Patient will benefit from skilled therapeutic intervention in order to improve the following deficits and impairments:  Decreased balance,Decreased strength,Pain,Impaired sensation,Difficulty walking,Decreased endurance,Abnormal gait  Visit Diagnosis: Unsteadiness on feet  Other abnormalities of gait and mobility  Muscle weakness (generalized)     Problem List Patient Active Problem List   Diagnosis Date Noted  . Recurrent falls 03/25/2020  . SDH (subdural hematoma)  (HCC) 03/21/2020  . Intracranial hemorrhage (HCC) 03/14/2020  . Fall at home, subsequent encounter 03/14/2020  . Leukocytosis 03/14/2020  . Nausea 09/13/2019  . Rotator cuff arthropathy of left shoulder 07/21/2019  . Dupuytren's disease of palm 07/12/2019  . Sebaceous cyst 06/27/2019  . COVID-19 05/08/2019  . Acute encephalopathy 05/08/2019  . Obstructive sleep apnea   . Asymmetrical left sensorineural hearing loss 07/12/2018  . Chronic anticoagulation 12/21/2017  . Compression fracture of thoracic vertebra (HCC) 09/21/2017  . Degeneration of thoracic intervertebral disc 09/21/2017  . Thoracic back pain 08/10/2017  . Degenerative joint disease of hand 07/14/2017  . Osteoarthritis   . Hypoxia   . Fibromyalgia   . Fatty tumor   . Cataract   . BPH (benign prostatic hyperplasia)   . Chronic atrial fibrillation (HCC)   . Insomnia 03/10/2017  . Chronic pain, legs and back 03/10/2017  . Hyperlipidemia 03/10/2017  . Obesity (BMI 30.0-34.9) 12/08/2016  . Depression 09/07/2016  . Spondylolisthesis 09/07/2016  . Hypothyroidism 03/08/2016  . GERD (gastroesophageal reflux disease) 03/08/2016  . Gout 03/08/2016  . Glaucoma 03/08/2016  . Type 2 diabetes mellitus (HCC) 03/08/2016  . Hereditary and idiopathic peripheral neuropathy 08/28/2015  . Essential hypertension 12/04/2011  . Allergic rhinitis, seasonal 12/04/2011  . COPD (chronic obstructive pulmonary disease) (HCC) 07/18/2011    Drake Leach, PT, DPT  06/11/2020, 1:10 PM  [facility] [address] [place], [place], [address] Phone: [phone]   Fax:  [phone]  Name: Enos Muhl MRN: [id number] Date of Birth: [birth year]

## 2020-06-12 ENCOUNTER — Ambulatory Visit (INDEPENDENT_AMBULATORY_CARE_PROVIDER_SITE_OTHER): Payer: Medicare Other | Admitting: Internal Medicine

## 2020-06-12 ENCOUNTER — Encounter: Payer: Self-pay | Admitting: Internal Medicine

## 2020-06-12 VITALS — BP 140/68 | HR 57 | Temp 98.5°F | Ht 67.0 in | Wt 196.0 lb

## 2020-06-12 DIAGNOSIS — E1165 Type 2 diabetes mellitus with hyperglycemia: Secondary | ICD-10-CM | POA: Diagnosis not present

## 2020-06-12 DIAGNOSIS — R296 Repeated falls: Secondary | ICD-10-CM

## 2020-06-12 DIAGNOSIS — W19XXXD Unspecified fall, subsequent encounter: Secondary | ICD-10-CM | POA: Diagnosis not present

## 2020-06-12 DIAGNOSIS — U071 COVID-19: Secondary | ICD-10-CM | POA: Diagnosis not present

## 2020-06-12 DIAGNOSIS — F419 Anxiety disorder, unspecified: Secondary | ICD-10-CM

## 2020-06-12 DIAGNOSIS — I482 Chronic atrial fibrillation, unspecified: Secondary | ICD-10-CM

## 2020-06-12 DIAGNOSIS — F411 Generalized anxiety disorder: Secondary | ICD-10-CM | POA: Insufficient documentation

## 2020-06-12 DIAGNOSIS — R202 Paresthesia of skin: Secondary | ICD-10-CM

## 2020-06-12 DIAGNOSIS — J0101 Acute recurrent maxillary sinusitis: Secondary | ICD-10-CM

## 2020-06-12 DIAGNOSIS — M109 Gout, unspecified: Secondary | ICD-10-CM | POA: Diagnosis not present

## 2020-06-12 DIAGNOSIS — S22000S Wedge compression fracture of unspecified thoracic vertebra, sequela: Secondary | ICD-10-CM

## 2020-06-12 DIAGNOSIS — G4700 Insomnia, unspecified: Secondary | ICD-10-CM | POA: Diagnosis not present

## 2020-06-12 DIAGNOSIS — Y92009 Unspecified place in unspecified non-institutional (private) residence as the place of occurrence of the external cause: Secondary | ICD-10-CM

## 2020-06-12 DIAGNOSIS — E559 Vitamin D deficiency, unspecified: Secondary | ICD-10-CM

## 2020-06-12 DIAGNOSIS — J019 Acute sinusitis, unspecified: Secondary | ICD-10-CM | POA: Insufficient documentation

## 2020-06-12 HISTORY — DX: Generalized anxiety disorder: F41.1

## 2020-06-12 LAB — CBC WITH DIFFERENTIAL/PLATELET
Basophils Absolute: 0 10*3/uL (ref 0.0–0.1)
Basophils Relative: 0.3 % (ref 0.0–3.0)
Eosinophils Absolute: 0 10*3/uL (ref 0.0–0.7)
Eosinophils Relative: 0.4 % (ref 0.0–5.0)
HCT: 40.6 % (ref 39.0–52.0)
Hemoglobin: 13.4 g/dL (ref 13.0–17.0)
Lymphocytes Relative: 23 % (ref 12.0–46.0)
Lymphs Abs: 1.5 10*3/uL (ref 0.7–4.0)
MCHC: 33 g/dL (ref 30.0–36.0)
MCV: 90.1 fl (ref 78.0–100.0)
Monocytes Absolute: 1.5 10*3/uL — ABNORMAL HIGH (ref 0.1–1.0)
Monocytes Relative: 22.4 % — ABNORMAL HIGH (ref 3.0–12.0)
Neutro Abs: 3.5 10*3/uL (ref 1.4–7.7)
Neutrophils Relative %: 53.9 % (ref 43.0–77.0)
Platelets: 120 10*3/uL — ABNORMAL LOW (ref 150.0–400.0)
RBC: 4.5 Mil/uL (ref 4.22–5.81)
RDW: 13.2 % (ref 11.5–15.5)
WBC: 6.6 10*3/uL (ref 4.0–10.5)

## 2020-06-12 LAB — COMPREHENSIVE METABOLIC PANEL
ALT: 29 U/L (ref 0–53)
AST: 24 U/L (ref 0–37)
Albumin: 4.6 g/dL (ref 3.5–5.2)
Alkaline Phosphatase: 102 U/L (ref 39–117)
BUN: 22 mg/dL (ref 6–23)
CO2: 27 mEq/L (ref 19–32)
Calcium: 9.6 mg/dL (ref 8.4–10.5)
Chloride: 99 mEq/L (ref 96–112)
Creatinine, Ser: 1.16 mg/dL (ref 0.40–1.50)
GFR: 57.63 mL/min — ABNORMAL LOW (ref >60.00)
Glucose, Bld: 169 mg/dL — ABNORMAL HIGH (ref 70–99)
Potassium: 3.6 mEq/L (ref 3.5–5.1)
Sodium: 138 mEq/L (ref 135–145)
Total Bilirubin: 0.5 mg/dL (ref 0.2–1.2)
Total Protein: 6.7 g/dL (ref 6.0–8.3)

## 2020-06-12 LAB — URIC ACID: Uric Acid, Serum: 8.4 mg/dL — ABNORMAL HIGH (ref 4.0–7.8)

## 2020-06-12 LAB — VITAMIN B12: Vitamin B-12: 602 pg/mL (ref 211–911)

## 2020-06-12 LAB — HEMOGLOBIN A1C: Hgb A1c MFr Bld: 7.5 % — ABNORMAL HIGH (ref 4.6–6.5)

## 2020-06-12 LAB — VITAMIN D 25 HYDROXY (VIT D DEFICIENCY, FRACTURES): VITD: 90.58 ng/mL (ref 30.00–100.00)

## 2020-06-12 LAB — TSH: TSH: 2.58 u[IU]/mL (ref 0.35–4.50)

## 2020-06-12 MED ORDER — DULOXETINE HCL 60 MG PO CPEP: 60.0000 mg | ORAL_CAPSULE | Freq: Every day | ORAL | 1 refills | Status: DC

## 2020-06-12 MED ORDER — LORAZEPAM 0.5 MG PO TABS: 0.5000 mg | ORAL_TABLET | Freq: Two times a day (BID) | ORAL | 1 refills | Status: DC | PRN

## 2020-06-12 MED ORDER — CEFUROXIME AXETIL 250 MG PO TABS: 250.0000 mg | ORAL_TABLET | Freq: Two times a day (BID) | ORAL | 0 refills | Status: DC

## 2020-06-12 NOTE — Progress Notes (Signed)
Subjective:  Patient ID: Jonathan [name], male    DOB: [birth year]  Age: 86 y.o. MRN: [id number]  CC: Insomnia and Medication Refill   HPI Jonathan [name] presents today with multiple complaints.  One of them is "not feeling well" for 1 month -he has been tired all the time, suffering with insomnia at night - gets 2 hrs of sleep at night. He would fall asleep during the day and drop his iPad down the floor.  Denies sleep apnea symptoms.  Another complaint is he is anxiety and panic attacks at night - worse. C/o anxiety - all the time, worse during the day... C/o memory problems - worse C/o L inner ankle pimple x 1 d -much better today... Complains of low back pain, he has been using Oxycodone at hs prn at night on occasion. He drinks alcohol -  no more than 1-2 shots at times to help him sleep  C/o sinusitis sx's x 2 weeks - bloody d/c, getting worse   Outpatient Medications Prior to Visit  Medication Sig Dispense Refill  . acetaminophen (TYLENOL) 650 MG CR tablet Take 650 mg by mouth in the morning and at bedtime. Pt takes 2 tabs 2 times daily    . apixaban (ELIQUIS) 5 MG TABS tablet Take 1 tablet (5 mg total) by mouth 2 (two) times daily. 180 tablet 1  . atorvastatin (LIPITOR) 20 MG tablet TAKE 1 TABLET BY MOUTH EVERY DAY (Patient taking differently: Take 20 mg by mouth daily.) 90 tablet 1  . bimatoprost (LUMIGAN) 0.01 % SOLN Place 1 drop into both eyes at bedtime.     . Biotin 5000 MCG TABS Take 5,000 mcg by mouth daily.    . blood glucose meter kit and supplies KIT Dispense based on patient and insurance preference. Use daily as directed. (FOR E11.9). 1 each 0  . busPIRone (BUSPAR) 5 MG tablet Take 1 tablet (5 mg total) by mouth 3 (three) times daily. 90 tablet 2  . calcium-vitamin D (OSCAL WITH D) 500-200 MG-UNIT tablet Take 1 tablet by mouth daily with breakfast.    . Capsaicin 0.1 % CREA Apply topically.    . colchicine 0.6 MG tablet Take 0.6 mg by mouth daily as needed (flareups).      . diltiazem (CARDIZEM CD) 300 MG 24 hr capsule Take 1 capsule (300 mg total) by mouth daily. 90 capsule 3  . diltiazem (CARDIZEM) 30 MG tablet Take 1 tablet (30 mg total) by mouth 2 (two) times daily as needed (sustained elevated heart rates >100 bpm for > 20 minutes.). 30 tablet 2  . doxazosin (CARDURA) 8 MG tablet TAKE 1 TABLET BY MOUTH EVERY DAY 90 tablet 1  . DULoxetine (CYMBALTA) 60 MG capsule Take 1 capsule (60 mg total) by mouth 2 (two) times daily. 180 capsule 1  . glucose blood test strip Use to check blood sugar daily. E11.9 100 each 3  . hydrochlorothiazide (HYDRODIURIL) 25 MG tablet Take 1 tablet (25 mg total) by mouth daily. 90 tablet 3  . KLOR-CON M20 20 MEQ tablet TAKE 1 TABLET BY MOUTH EVERY DAY (Patient taking differently: Take 20 mEq by mouth daily.) 90 tablet 1  . Lancets MISC Use to check blood sugars daily. E11.9 100 each 3  . levothyroxine (SYNTHROID) 112 MCG tablet Take 1 tablet (112 mcg total) by mouth daily before breakfast. 90 tablet 1  . magnesium oxide (MAG-OX) 400 MG tablet Take 400 mg by mouth daily.    . Melatonin 5 MG TABS  Take 5 mg by mouth at bedtime.    . metFORMIN (GLUCOPHAGE-XR) 750 MG 24 hr tablet Take 1 tablet (750 mg total) by mouth in the morning and at bedtime. 180 tablet 1  . mometasone (NASONEX) 50 MCG/ACT nasal spray Place 2 sprays into the nose daily. (Patient taking differently: Place 2 sprays into the nose as needed (allergies).) 17 g 3  . Multiple Vitamin (MULTIVITAMIN) tablet Take 1 tablet by mouth daily.    . nitroGLYCERIN (NITROSTAT) 0.4 MG SL tablet Place 1 tablet (0.4 mg total) under the tongue every 5 (five) minutes as needed for chest pain. 90 tablet 3  . omeprazole (PRILOSEC) 20 MG capsule TAKE 1 CAPSULE BY MOUTH EVERY DAY 90 capsule 2  . RESTASIS 0.05 % ophthalmic emulsion Place 1 drop into both eyes 2 (two) times daily.     . sildenafil (REVATIO) 20 MG tablet Take 40-100 mg by mouth daily as needed.    . solifenacin (VESICARE) 10 MG  tablet Take 10 mg by mouth daily.    . vitamin C (ASCORBIC ACID) 500 MG tablet Take 500 mg by mouth 2 (two) times daily.     Marland Kitchen VITAMIN D, CHOLECALCIFEROL, PO Take 25 mcg by mouth daily.      No facility-administered medications prior to visit.    ROS: Review of Systems  Constitutional: Negative for appetite change, fatigue and unexpected weight change.  HENT: Negative for congestion, nosebleeds, sneezing, sore throat and trouble swallowing.   Eyes: Negative for itching and visual disturbance.  Respiratory: Negative for cough.   Cardiovascular: Negative for chest pain, palpitations and leg swelling.  Gastrointestinal: Negative for abdominal distention, blood in stool, diarrhea and nausea.  Genitourinary: Negative for frequency and hematuria.  Musculoskeletal: Negative for back pain, gait problem, joint swelling and neck pain.  Skin: Negative for rash.  Neurological: Negative for dizziness, tremors, speech difficulty and weakness.  Psychiatric/Behavioral: Positive for decreased concentration and sleep disturbance. Negative for agitation and dysphoric mood. The patient is nervous/anxious.     Objective:  BP 140/68   Pulse (!) 57   Temp 98.5 F (36.9 C) (Oral)   Ht 5' 7"  (1.702 m)   Wt 196 lb (88.9 kg)   SpO2 98%   BMI 30.70 kg/m   BP Readings from Last 3 Encounters:  06/12/20 140/68  06/11/20 128/76  04/26/20 134/82    Wt Readings from Last 3 Encounters:  06/12/20 196 lb (88.9 kg)  04/26/20 204 lb (92.5 kg)  04/18/20 200 lb (90.7 kg)    Physical Exam Constitutional:      General: He is not in acute distress.    Appearance: He is well-developed.     Comments: NAD  HENT:     Mouth/Throat:     Mouth: Oropharynx is clear and moist.  Eyes:     Conjunctiva/sclera: Conjunctivae normal.     Pupils: Pupils are equal, round, and reactive to light.  Neck:     Thyroid: No thyromegaly.     Vascular: No JVD.  Cardiovascular:     Rate and Rhythm: Normal rate and regular  rhythm.     Pulses: Intact distal pulses.     Heart sounds: Normal heart sounds. No murmur heard. No friction rub. No gallop.   Pulmonary:     Effort: Pulmonary effort is normal. No respiratory distress.     Breath sounds: Normal breath sounds. No wheezing or rales.  Chest:     Chest wall: No tenderness.  Abdominal:  General: Bowel sounds are normal. There is no distension.     Palpations: Abdomen is soft. There is no mass.     Tenderness: There is no abdominal tenderness. There is no guarding or rebound.  Musculoskeletal:        General: No tenderness or edema. Normal range of motion.     Cervical back: Normal range of motion.  Lymphadenopathy:     Cervical: No cervical adenopathy.  Skin:    General: Skin is warm and dry.     Findings: No rash.  Neurological:     Mental Status: He is alert and oriented to person, place, and time.     Cranial Nerves: No cranial nerve deficit.     Motor: No abnormal muscle tone.     Coordination: He displays a negative Romberg sign. Coordination abnormal.     Gait: Gait normal.     Deep Tendon Reflexes: Reflexes are normal and symmetric.  Psychiatric:        Mood and Affect: Mood and affect normal.        Behavior: Behavior normal.        Thought Content: Thought content normal.        Judgment: Judgment normal.   Left lateral malleolus with a dime sized residual erythema, nontender A/o/c.  He looks younger than his stated age.  He is somewhat ataxic.    A total time of >45 minutes was spent preparing to see the patient, reviewing tests, x-rays,  records.  Also, obtaining history and performing comprehensive physical exam.  Additionally, counseling the patient regarding the above listed issues -insomnia, polypharmacy, memory issues.   Finally, documenting clinical information in the health records, coordination of care, educating the patient. It is a complex case.   Lab Results  Component Value Date   WBC 13.3 (H) 03/15/2020   HGB 12.5  (L) 03/15/2020   HCT 37.3 (L) 03/15/2020   PLT 126 (L) 03/15/2020   GLUCOSE 266 (H) 03/14/2020   CHOL 111 08/14/2019   TRIG 211.0 (H) 08/14/2019   HDL 37.60 (L) 08/14/2019   LDLDIRECT 51.0 08/14/2019   LDLCALC 66 11/11/2018   ALT 21 03/14/2020   AST 24 03/14/2020   NA 139 03/14/2020   K 4.2 03/14/2020   CL 102 03/14/2020   CREATININE 1.04 03/14/2020   BUN 26 (H) 03/14/2020   CO2 30 03/14/2020   TSH 3.13 08/14/2019   INR 0.9 03/14/2020   HGBA1C 8.1 (H) 03/14/2020   MICROALBUR 28.1 (H) 08/11/2017    CT HEAD WO CONTRAST  Result Date: 04/01/2020 CLINICAL DATA:  Subdural hematoma. EXAM: CT HEAD WITHOUT CONTRAST TECHNIQUE: Contiguous axial images were obtained from the base of the skull through the vertex without intravenous contrast. COMPARISON:  Head CT March 15, 2020 FINDINGS: Brain: Interval resolution of the intraventricular hemorrhage and near complete resolution of bilateral subdural fluid collection. Small residual fluid collection within the bilateral parietal convexities measures up to 3 mm bilaterally. No new intracranial hemorrhage. No evidence of acute infarction, hydrocephalus, or mass lesion/mass effect. Prominence of the ventricular system and cerebral sulci, reflecting parenchymal volume. Vascular: Calcified plaques in the bilateral carotid siphons. Skull: Normal. Negative for fracture or focal lesion. Sinuses/Orbits: No acute finding. Other: None. IMPRESSION: Interval resolution of the intraventricular hemorrhage and near complete resolution of bilateral subdural fluid collection. The small residual fluid collections in the bilateral parietal measures up to 3 mm. No new intracranial hemorrhage. Electronically Signed   By: Sandre Kitty.D.  On: 04/01/2020 12:24    Assessment & Plan:    Walker Kehr, MD

## 2020-06-12 NOTE — Assessment & Plan Note (Addendum)
He is reporting severe symptoms.  Options are limited.  We will try a low-dose lorazepam at night and during the day for anxiety.  He will stop if problems.  We will discontinue BuSpar.

## 2020-06-12 NOTE — Assessment & Plan Note (Addendum)
Post-COVID sx's -" brain fog" Try Lion's mane supplement and B complex with niacin for neuropathy

## 2020-06-12 NOTE — Assessment & Plan Note (Signed)
In PT 

## 2020-06-12 NOTE — Assessment & Plan Note (Signed)
On Eliquis

## 2020-06-12 NOTE — Assessment & Plan Note (Signed)
Start Ceftin po

## 2020-06-12 NOTE — Assessment & Plan Note (Signed)
She is in PT.  Treat neuropathy. Try Lion's mane supplement and B complex with niacin for neuropathy

## 2020-06-12 NOTE — Assessment & Plan Note (Addendum)
In PT. Treat neuropathy, insomnia.  Reduce Cymbalta to 1 a day.  Discontinue BuSpar.

## 2020-06-12 NOTE — Assessment & Plan Note (Signed)
He is reporting severe insomnia and anxiety symptoms.  Options are limited.  We will try a low-dose lorazepam at night and during the day for anxiety and panic attacks.  He will stop lorazepam if problems.  He will stop drinking alcohol.  Discontinue BuSpar.  Reduce Cymbalta to 1 a day.  He will see Dr. Lawerance Bach in a couple weeks.

## 2020-06-12 NOTE — Patient Instructions (Addendum)
Reduce Cymbalta to one a day Do not take Trazodone Stop Buspar Do not drink alcohol Chair yoga       B-complex with Niacin 100 mg    Lion's mane

## 2020-06-12 NOTE — Assessment & Plan Note (Signed)
Check A1c. 

## 2020-06-12 NOTE — Assessment & Plan Note (Signed)
He is recent left ankle redness and swelling could be related to gout flareup.  It is much better now.

## 2020-06-13 ENCOUNTER — Encounter: Payer: Self-pay | Admitting: Internal Medicine

## 2020-06-13 ENCOUNTER — Other Ambulatory Visit: Payer: Self-pay

## 2020-06-13 ENCOUNTER — Encounter: Payer: Self-pay | Admitting: Physical Therapy

## 2020-06-13 ENCOUNTER — Ambulatory Visit: Payer: Medicare Other | Admitting: Physical Therapy

## 2020-06-13 DIAGNOSIS — R2689 Other abnormalities of gait and mobility: Secondary | ICD-10-CM

## 2020-06-13 DIAGNOSIS — R2681 Unsteadiness on feet: Secondary | ICD-10-CM

## 2020-06-13 DIAGNOSIS — M6281 Muscle weakness (generalized): Secondary | ICD-10-CM

## 2020-06-13 LAB — URINALYSIS, ROUTINE W REFLEX MICROSCOPIC
Bilirubin Urine: NEGATIVE
Hgb urine dipstick: NEGATIVE
Leukocytes,Ua: NEGATIVE
Nitrite: NEGATIVE
Specific Gravity, Urine: >=1.03 — AB (ref 1.000–1.030)
Total Protein, Urine: 100 — AB
Urine Glucose: NEGATIVE
Urobilinogen, UA: 0.2 (ref 0.0–1.0)
pH: 5.5 (ref 5.0–8.0)

## 2020-06-13 MED ORDER — DOXAZOSIN MESYLATE 8 MG PO TABS: 8.0000 mg | ORAL_TABLET | Freq: Every day | ORAL | 1 refills | Status: DC

## 2020-06-13 MED ORDER — ATORVASTATIN CALCIUM 20 MG PO TABS: 20.0000 mg | ORAL_TABLET | Freq: Every day | ORAL | 1 refills | Status: DC

## 2020-06-13 MED ORDER — METFORMIN HCL ER 750 MG PO TB24: 750.0000 mg | ORAL_TABLET | Freq: Two times a day (BID) | ORAL | 1 refills | Status: DC

## 2020-06-13 NOTE — Therapy (Addendum)
[facility] [address] [place], [place], [address] Phone: [phone]   Fax:  [phone]  Physical Therapy Treatment  Patient Details  Name: Jonathan [name] MRN: [id number] Date of Birth: 01/30/1933 Referring Provider (PT): Cheryll Cockayne, MD   Encounter Date: 06/13/2020   PT End of Session - 06/13/20 1100    Visit Number 13    Number of Visits 18    Date for PT Re-Evaluation 07/27/20   POC for 4 weeks, Cert for 60 days   Authorization Type Medicare (10th Visit PN)    Progress Note Due on Visit 20    PT Start Time 1017    PT Stop Time 1058    PT Time Calculation (min) 41 min    Equipment Utilized During Treatment Gait belt    Activity Tolerance Patient tolerated treatment well    Behavior During Therapy WFL for tasks assessed/performed           Past Medical History:  Diagnosis Date  . Atrial fibrillation (HCC)    a. paroxysmal, on Eliquis for anticoagulation  . BPH (benign prostatic hyperplasia)   . Bronchitis   . Cataract   . COPD (chronic obstructive pulmonary disease) (HCC)    2 liters O2 HS  . Depression   . Fatty tumor    waste and back  . Fibromyalgia   . GERD (gastroesophageal reflux disease)   . Glaucoma    bilateral eyes  . Gout   . Hereditary and idiopathic peripheral neuropathy 08/28/2015  . Hypertension   . Hypothyroidism   . Hypoxia   . Insomnia   . Osteoarthritis   . Osteoporosis   . Peripheral neuropathy   . Sleep apnea    uses cpap-add oxygen at night  . Spinal compression fracture (HCC) seventh vertebre  . Transient alteration of awareness 08/28/2015  . Type 2 diabetes mellitus (HCC) 03/08/2016    Past Surgical History:  Procedure Laterality Date  . APPENDECTOMY  age 41  . CARPAL TUNNEL RELEASE Right    early 2000s  . CATARACT EXTRACTION     bilateral  . CHOLECYSTECTOMY  age 89  . EYE SURGERY    . FOOT ARTHRODESIS Right 02/02/2013   Procedure: RIGHT HALLUX METATARSAL  PHALANGEAL JOINT ARTHRODESIS ;  Surgeon: Toni Arthurs, MD;  Location: [facility];  Service: Orthopedics;  Laterality: Right;  . INGUINAL HERNIA REPAIR  age 54   rt side  . NASAL CONCHA BULLOSA RESECTION  age 62  . PROSTATE SURGERY    . SHOULDER ARTHROSCOPY W/ ROTATOR CUFF REPAIR Right    early 2000s  . tonsil    . VASECTOMY  age 68    There were no vitals filed for this visit.   Subjective Assessment - 06/13/20 1019    Subjective Feeling much better today. Saw the doctor yesterday and had some medication changes and had some blood tests done.    Pertinent History pAF on Eliquis, chronic pain on oxycodone, previous SDH, COPD on home O2 noct, HTN, hypothyroidism, OSA on CPAP, and DM, Neuropathy    Limitations Standing;Walking    Patient Stated Goals Improve the Balance; Improve Core Strength    Currently in Pain? Yes    Pain Score 6     Pain Location Back    Pain Orientation Mid    Pain Descriptors / Indicators Aching    Pain Type Chronic pain    Pain Onset Yesterday    Aggravating Factors  "no idea"  Pain Relieving Factors "not sure"    Pain Onset Yesterday                                  Balance Exercises - 06/13/20 1036      Balance Exercises: Standing   Standing Eyes Closed Narrow base of support (BOS)    Standing Eyes Closed Limitations standing on air ex, feet slightly apart, 4 x 30 seconds with intermittent UE support    SLS with Vectors Foam/compliant surface;Intermittent upper extremity assist;Limitations    SLS with Vectors Limitations standing on 2 1" floor pads, holding SLS B 3 x 10 seconds, then head motions with intermittent UE support x5 reps head turns B, x5 reps head nods B, min guard/min A for balance    Stepping Strategy Posterior;Foam/compliant surface;10 reps;Limitations    Stepping Strategy Limitations on red balance beam using BUE support alternating posterior steps - cues for incr step length, then standing up  incline alternating posterior steps x10 reps, standing down incline alternating forward stepping x10 reps with min guard    Partial Tandem Stance Eyes open;Foam/compliant surface;2 reps;30 secs;Intermittent upper extremity support    Sit to Stand Limitations;Foam/compliant surface    Sit to Stand Limitations on red balance beam, intermittent use of UE support x10 reps, cues for nose over toes for forward weight shift   Other Standing Exercises on red balance beam: alternating heel taps x10 reps B with UE support    Other Standing Exercises Comments on blue air ex: x10 reps heel <> toe raises with BUE support               PT Short Term Goals - 05/28/20 1350      PT SHORT TERM GOAL #1   Title = LTGs             PT Long Term Goals - 05/28/20 1350      PT LONG TERM GOAL #1   Title Patient will be independent with final balance/strengthening HEP (All LTGs Due: 06/28/20)    Baseline continue to progress HEP, completing 2-3 days/week    Time 4    Period Weeks    Status Revised    Target Date 06/28/20      PT LONG TERM GOAL #2   Title Patient will improve FGA to >/= 24/30 to demonstrate reduced fall risk.    Baseline 19/30, 22/30 on 05/23/20    Time 4    Period Weeks    Status Revised      PT LONG TERM GOAL #3   Title Patient will demo ability to complete situation 4 of M-CTSIB for >/= 20 seconds    Baseline 6 seconds on situation 4    Time 4    Period Weeks    Status Revised      PT LONG TERM GOAL #4   Title Patient will improve TUG to </= 10 secs to demonstrate improved functional mobility and reduced fall risk    Baseline 12.16, 11.69 seconds    Time 4    Period Weeks    Status Revised      PT LONG TERM GOAL #5   Title --    Baseline --    Time --    Period --    Status --               06/13/20 1219  Plan  Clinical Impression Statement Today's  skilled session continued to focus on balance on compliant surfaces and inclines. Pt tolerated session well.  Most challenged by SLS activities on RLE on compliant surfaces. Pt needing frequent reminder cues of how to properly perform exercises. Pt with improvement with stepping strategies with incr reps. Will continue to progress towards LTGs.  Personal Factors and Comorbidities Comorbidity 3+  Comorbidities pAF on Eliquis, chronic pain on oxycodone and Ambien, previous SDH, COPD on home O2 noct, HTN, hypothyroidism, OSA on CPAP, and DM  Examination-Activity Limitations Locomotion Level;Stairs  Examination-Participation Restrictions Community Activity;Yard Work  Pt will benefit from skilled therapeutic intervention in order to improve on the following deficits Decreased balance;Decreased strength;Pain;Impaired sensation;Difficulty walking;Decreased endurance;Abnormal gait  Stability/Clinical Decision Making Evolving/Moderate complexity  Rehab Potential Good  PT Frequency 2x / week  PT Duration 4 weeks  PT Treatment/Interventions ADLs/Self Care Home Management;Electrical Stimulation;DME Instruction;Stair training;Functional mobility training;Gait training;Therapeutic activities;Therapeutic exercise;Balance training;Neuromuscular re-education;Patient/family education;Vestibular  PT Next Visit Plan SLS, continue balance exercsies (complaint surfaces), gait/balance with dual tasking/cognitive challenge, tandem walking. strengtening exercises. SciFit  Consulted and Agree with Plan of Care Patient        Patient will benefit from skilled therapeutic intervention in order to improve the following deficits and impairments:     Visit Diagnosis: Unsteadiness on feet  Other abnormalities of gait and mobility  Muscle weakness (generalized)     Problem List Patient Active Problem List   Diagnosis Date Noted  . Acute sinusitis 06/12/2020  . Anxiety 06/12/2020  . Recurrent falls 03/25/2020  . SDH (subdural hematoma) ([place]) 03/21/2020  . Intracranial hemorrhage ([place]) 03/14/2020  . Fall at home,  subsequent encounter 03/14/2020  . Leukocytosis 03/14/2020  . Nausea 09/13/2019  . Rotator cuff arthropathy of left shoulder 07/21/2019  . Dupuytren's disease of palm 07/12/2019  . Sebaceous cyst 06/27/2019  . COVID-19 05/08/2019  . Acute encephalopathy 05/08/2019  . Obstructive sleep apnea   . Asymmetrical left sensorineural hearing loss 07/12/2018  . Chronic anticoagulation 12/21/2017  . Compression fracture of thoracic vertebra ([place]) 09/21/2017  . Degeneration of thoracic intervertebral disc 09/21/2017  . Thoracic back pain 08/10/2017  . Degenerative joint disease of hand 07/14/2017  . Osteoarthritis   . Hypoxia   . Fibromyalgia   . Fatty tumor   . Cataract   . BPH (benign prostatic hyperplasia)   . Chronic atrial fibrillation ([place])   . Insomnia 03/10/2017  . Chronic pain, legs and back 03/10/2017  . Hyperlipidemia 03/10/2017  . Obesity (BMI 30.0-34.9) 12/08/2016  . Depression 09/07/2016  . Spondylolisthesis 09/07/2016  . Hypothyroidism 03/08/2016  . GERD (gastroesophageal reflux disease) 03/08/2016  . Gout 03/08/2016  . Glaucoma 03/08/2016  . Type 2 diabetes mellitus ([place]) 03/08/2016  . Hereditary and idiopathic peripheral neuropathy 08/28/2015  . Essential hypertension 12/04/2011  . Allergic rhinitis, seasonal 12/04/2011  . COPD (chronic obstructive pulmonary disease) ([place]) 07/18/2011    Arliss Journey, PT, DPT  06/13/2020, 11:01 AM  [facility] [address] [place], [name], [address] Phone: [phone]   Fax:  [phone]  Name: Christoval Heckmann MRN: [id number] Date of Birth: 14-Mar-1933

## 2020-06-16 NOTE — Progress Notes (Signed)
Subjective:    Patient ID: Jonathan [name], male    DOB: Dec 20, 1932, 87 y.o.   MRN: [id number]  HPI The patient is here for follow up of their chronic medical problems, including anxiety/panic attacks, decline in memory, polypharmacy.    He saw Dr Posey Rea and his cymbalta was decreased, buspar was stopped, ativan was started and he was advised to stop drinking alcohol.     He falls asleep while looking at his ipad.  He does not feel sleepy at that time.  He is also having panic attacks.  They can be severe at times.  He has stopped driving.  The ativan has helped some but it lasts only 2 hrs.   He goes to sleep easily but after 2 hrs he wakes up with a panic attack and paces and does household chores.  He may fall asleep on the couch.  He naps during the day.    He is depressed.  He feels overwhelmed by his medications, things that need to be done around the house.     Medications and allergies reviewed with patient and updated if appropriate.  Patient Active Problem List   Diagnosis Date Noted  . Acute sinusitis 06/12/2020  . Anxiety 06/12/2020  . Recurrent falls 03/25/2020  . SDH (subdural hematoma) (HCC) 03/21/2020  . Intracranial hemorrhage (HCC) 03/14/2020  . Fall at home, subsequent encounter 03/14/2020  . Leukocytosis 03/14/2020  . Nausea 09/13/2019  . Rotator cuff arthropathy of left shoulder 07/21/2019  . Dupuytren's disease of palm 07/12/2019  . Sebaceous cyst 06/27/2019  . COVID-19 05/08/2019  . Acute encephalopathy 05/08/2019  . Obstructive sleep apnea   . Asymmetrical left sensorineural hearing loss 07/12/2018  . Chronic anticoagulation 12/21/2017  . Compression fracture of thoracic vertebra (HCC) 09/21/2017  . Degeneration of thoracic intervertebral disc 09/21/2017  . Thoracic back pain 08/10/2017  . Degenerative joint disease of hand 07/14/2017  . Osteoarthritis   . Hypoxia   . Fibromyalgia   . Fatty tumor   . Cataract   . BPH (benign prostatic  hyperplasia)   . Chronic atrial fibrillation (HCC)   . Insomnia 03/10/2017  . Chronic pain, legs and back 03/10/2017  . Hyperlipidemia 03/10/2017  . Obesity (BMI 30.0-34.9) 12/08/2016  . Depression 09/07/2016  . Spondylolisthesis 09/07/2016  . Hypothyroidism 03/08/2016  . GERD (gastroesophageal reflux disease) 03/08/2016  . Gout 03/08/2016  . Glaucoma 03/08/2016  . Type 2 diabetes mellitus (HCC) 03/08/2016  . Hereditary and idiopathic peripheral neuropathy 08/28/2015  . Essential hypertension 12/04/2011  . Allergic rhinitis, seasonal 12/04/2011  . COPD (chronic obstructive pulmonary disease) (HCC) 07/18/2011    Current Outpatient Medications on File Prior to Visit  Medication Sig Dispense Refill  . acetaminophen (TYLENOL) 650 MG CR tablet Take 650 mg by mouth in the morning and at bedtime. Pt takes 2 tabs 2 times daily    . apixaban (ELIQUIS) 5 MG TABS tablet Take 1 tablet (5 mg total) by mouth 2 (two) times daily. 180 tablet 1  . bimatoprost (LUMIGAN) 0.01 % SOLN Place 1 drop into both eyes at bedtime.     . Biotin 5000 MCG TABS Take 5,000 mcg by mouth daily.    . calcium-vitamin D (OSCAL WITH D) 500-200 MG-UNIT tablet Take 1 tablet by mouth daily with breakfast.    . colchicine 0.6 MG tablet Take 0.6 mg by mouth daily as needed (flareups).     . diltiazem (CARDIZEM CD) 300 MG 24 hr capsule Take 1 capsule (300  mg total) by mouth daily. 90 capsule 3  . diltiazem (CARDIZEM) 30 MG tablet Take 1 tablet (30 mg total) by mouth 2 (two) times daily as needed (sustained elevated heart rates >100 bpm for > 20 minutes.). 30 tablet 2  . doxazosin (CARDURA) 8 MG tablet Take 1 tablet (8 mg total) by mouth daily. 90 tablet 1  . glucose blood test strip Use to check blood sugar daily. E11.9 100 each 3  . Lancets MISC Use to check blood sugars daily. E11.9 100 each 3  . LORazepam (ATIVAN) 0.5 MG tablet Take 1 tablet (0.5 mg total) by mouth 2 (two) times daily as needed for anxiety. 30 tablet 1  .  magnesium oxide (MAG-OX) 400 MG tablet Take 400 mg by mouth daily.    . Melatonin 5 MG TABS Take 5 mg by mouth at bedtime.    . mometasone (NASONEX) 50 MCG/ACT nasal spray Place 2 sprays into the nose daily. (Patient taking differently: Place 2 sprays into the nose as needed (allergies).) 17 g 3  . Multiple Vitamin (MULTIVITAMIN) tablet Take 1 tablet by mouth daily.    . nitroGLYCERIN (NITROSTAT) 0.4 MG SL tablet Place 1 tablet (0.4 mg total) under the tongue every 5 (five) minutes as needed for chest pain. 90 tablet 3  . RESTASIS 0.05 % ophthalmic emulsion Place 1 drop into both eyes 2 (two) times daily.     . sildenafil (REVATIO) 20 MG tablet Take 40-100 mg by mouth daily as needed.    . solifenacin (VESICARE) 10 MG tablet Take 10 mg by mouth daily.    . vitamin C (ASCORBIC ACID) 500 MG tablet Take 500 mg by mouth 2 (two) times daily.     Marland Kitchen VITAMIN D, CHOLECALCIFEROL, PO Take 25 mcg by mouth daily.      No current facility-administered medications on file prior to visit.    Past Medical History:  Diagnosis Date  . Atrial fibrillation (HCC)    a. paroxysmal, on Eliquis for anticoagulation  . BPH (benign prostatic hyperplasia)   . Bronchitis   . Cataract   . COPD (chronic obstructive pulmonary disease) (HCC)    2 liters O2 HS  . Depression   . Fatty tumor    waste and back  . Fibromyalgia   . GERD (gastroesophageal reflux disease)   . Glaucoma    bilateral eyes  . Gout   . Hereditary and idiopathic peripheral neuropathy 08/28/2015  . Hypertension   . Hypothyroidism   . Hypoxia   . Insomnia   . Osteoarthritis   . Osteoporosis   . Peripheral neuropathy   . Sleep apnea    uses cpap-add oxygen at night  . Spinal compression fracture (HCC) seventh vertebre  . Transient alteration of awareness 08/28/2015  . Type 2 diabetes mellitus ([place]) 03/08/2016    Past Surgical History:  Procedure Laterality Date  . APPENDECTOMY  age 62  . CARPAL TUNNEL RELEASE Right    early 2000s  .  CATARACT EXTRACTION     bilateral  . CHOLECYSTECTOMY  age 60  . EYE SURGERY    . FOOT ARTHRODESIS Right 02/02/2013   Procedure: RIGHT HALLUX METATARSAL PHALANGEAL JOINT ARTHRODESIS ;  Surgeon: Wylene Simmer, MD;  Location: [facility];  Service: Orthopedics;  Laterality: Right;  . INGUINAL HERNIA REPAIR  age 67   rt side  . NASAL CONCHA BULLOSA RESECTION  age 67  . PROSTATE SURGERY    . SHOULDER ARTHROSCOPY W/ ROTATOR CUFF REPAIR  Right    early 2000s  . tonsil    . VASECTOMY  age 55    Social History   Socioeconomic History  . Marital status: Married    Spouse name: Not on file  . Number of children: 2  . Years of education: 64  . Highest education level: Master's degree (e.g., MA, MS, MEng, MEd, MSW, MBA)  Occupational History  . Occupation: RETIRED    Employer: RETIRED  Tobacco Use  . Smoking status: Former Smoker    Packs/day: 2.00    Years: 10.00    Pack years: 20.00    Types: Cigarettes    Quit date: 07/18/1975    Years since quitting: 44.9  . Smokeless tobacco: Never Used  Vaping Use  . Vaping Use: Never used  Substance and Sexual Activity  . Alcohol use: Yes    Comment: 1-2 drinks/day  . Drug use: No  . Sexual activity: Not Currently  Other Topics Concern  . Not on file  Social History Narrative  . Not on file   Social Determinants of Health   Financial Resource Strain: Low Risk   . Difficulty of Paying Living Expenses: Not hard at all  Food Insecurity: No Food Insecurity  . Worried About Programme researcher, broadcasting/film/video in the Last Year: Never true  . Ran Out of Food in the Last Year: Never true  Transportation Needs: No Transportation Needs  . Lack of Transportation (Medical): No  . Lack of Transportation (Non-Medical): No  Physical Activity: Inactive  . Days of Exercise per Week: 0 days  . Minutes of Exercise per Session: 0 min  Stress: No Stress Concern Present  . Feeling of Stress : Not at all  Social Connections: Socially Integrated  .  Frequency of Communication with Friends and Family: More than three times a week  . Frequency of Social Gatherings with Friends and Family: Once a week  . Attends Religious Services: 1 to 4 times per year  . Active Member of Clubs or Organizations: Yes  . Attends Banker Meetings: 1 to 4 times per year  . Marital Status: Married    Family History  Problem Relation Age of Onset  . Coronary artery disease Brother   . Heart disease Father   . Lung cancer Father   . Kidney cancer Father   . Prostate cancer Father   . Arthritis Mother   . Lung cancer Mother   . Dementia Mother        Unspecified type, not Alzheimer's disease    Review of Systems  Constitutional: Positive for appetite change and fatigue.  Psychiatric/Behavioral: Positive for agitation (irritability), decreased concentration, dysphoric mood and sleep disturbance. Negative for suicidal ideas. The patient is nervous/anxious.        Objective:   Vitals:   06/17/20 1547  BP: (!) 142/68  Pulse: (!) 58  Temp: 98 F (36.7 C)  SpO2: 98%   BP Readings from Last 3 Encounters:  06/17/20 (!) 142/68  06/12/20 140/68  06/11/20 128/76   Wt Readings from Last 3 Encounters:  06/17/20 198 lb 3.2 oz (89.9 kg)  06/12/20 196 lb (88.9 kg)  04/26/20 204 lb (92.5 kg)   Body mass index is 31.04 kg/m.   Physical Exam    Constitutional: Appears well-developed and well-nourished. No distress.  Psychiatric: depressed mood and affect. Behavior is normal.      Assessment & Plan:    See Problem List for Assessment and Plan of chronic medical  problems.    This visit occurred during the SARS-CoV-2 public health emergency.  Safety protocols were in place, including screening questions prior to the visit, additional usage of staff PPE, and extensive cleaning of exam room while observing appropriate contact time as indicated for disinfecting solutions.

## 2020-06-16 NOTE — Patient Instructions (Addendum)
   Medications changes include :   Change for this coming week -  Decrease cymbalta to 30 mg daily for one week and then stop.    Change for next week - Once you stop the cymbalta start prozac (fluoxetine) 20 mg daily  Restart allopurinol 300 mg daily   Your prescription(s) have been submitted to your pharmacy. Please take as directed and contact our office if you believe you are having problem(s) with the medication(s).   Please followup in 3 weeks    Dr Emerson Monte [address], [place], [place] [address] [phone]  [facility]  [address] [place], [place] [phone]  [facility]            [address] [place], [place] [phone]  Dr Lawerance Bach [facility] [address] [place], [place] [address] [phone]    [facility] - therapist [address] [place], [place] [address] [phone]

## 2020-06-17 ENCOUNTER — Telehealth: Payer: Medicare Other

## 2020-06-17 ENCOUNTER — Other Ambulatory Visit: Payer: Self-pay

## 2020-06-17 ENCOUNTER — Ambulatory Visit (INDEPENDENT_AMBULATORY_CARE_PROVIDER_SITE_OTHER): Payer: Medicare Other | Admitting: Internal Medicine

## 2020-06-17 ENCOUNTER — Encounter: Payer: Self-pay | Admitting: Internal Medicine

## 2020-06-17 DIAGNOSIS — F419 Anxiety disorder, unspecified: Secondary | ICD-10-CM | POA: Diagnosis not present

## 2020-06-17 DIAGNOSIS — F3289 Other specified depressive episodes: Secondary | ICD-10-CM | POA: Diagnosis not present

## 2020-06-17 DIAGNOSIS — M109 Gout, unspecified: Secondary | ICD-10-CM | POA: Diagnosis not present

## 2020-06-17 DIAGNOSIS — G4700 Insomnia, unspecified: Secondary | ICD-10-CM | POA: Diagnosis not present

## 2020-06-17 MED ORDER — POTASSIUM CHLORIDE CRYS ER 20 MEQ PO TBCR: 20.0000 meq | EXTENDED_RELEASE_TABLET | Freq: Every day | ORAL | 1 refills | Status: DC

## 2020-06-17 MED ORDER — METFORMIN HCL ER 750 MG PO TB24: 750.0000 mg | ORAL_TABLET | Freq: Two times a day (BID) | ORAL | 1 refills | Status: DC

## 2020-06-17 MED ORDER — ALLOPURINOL 300 MG PO TABS: 300.0000 mg | ORAL_TABLET | Freq: Every evening | ORAL | 3 refills | Status: DC

## 2020-06-17 MED ORDER — DULOXETINE HCL 30 MG PO CPEP: 30.0000 mg | ORAL_CAPSULE | Freq: Every day | ORAL | 0 refills | Status: DC

## 2020-06-17 MED ORDER — FLUOXETINE HCL 20 MG PO TABS: 20.0000 mg | ORAL_TABLET | Freq: Every day | ORAL | 3 refills | Status: DC

## 2020-06-17 MED ORDER — HYDROCHLOROTHIAZIDE 25 MG PO TABS: 25.0000 mg | ORAL_TABLET | Freq: Every day | ORAL | 3 refills | Status: DC

## 2020-06-17 MED ORDER — ATORVASTATIN CALCIUM 20 MG PO TABS: 20.0000 mg | ORAL_TABLET | Freq: Every day | ORAL | 1 refills | Status: DC

## 2020-06-17 MED ORDER — OMEPRAZOLE 20 MG PO CPDR: DELAYED_RELEASE_CAPSULE | ORAL | 1 refills | Status: DC

## 2020-06-17 MED ORDER — LEVOTHYROXINE SODIUM 112 MCG PO TABS: 112.0000 ug | ORAL_TABLET | Freq: Every day | ORAL | 1 refills | Status: DC

## 2020-06-17 NOTE — Assessment & Plan Note (Addendum)
Chronic Not sleeping well Not currently on any medication - has had amnesia, falls with subdural hematoma w/ sleep medications so would like to avoid ideally Consider retrying melatonin Stressed good sleep hygiene May need to consider medication if no improvement since poor sleep is likely contributing to memory issues, irritability, difficulty focusing and anxiety/depression

## 2020-06-17 NOTE — Assessment & Plan Note (Addendum)
Chronic  Not controlled Ativan has not helped - it only lasts 2 hrs He has been on cymbalta for a while - will taper off and start prozac 20 mg daily F/u in 3 week to adjust further - sooner if needed  Discussed with him I think he would benefit from seeing a psychiatrist and therapist - he has seen both in the past and will consider it

## 2020-06-17 NOTE — Assessment & Plan Note (Addendum)
Chronic  Not controlled He has been on cymbalta for a while - will taper off and start prozac 20 mg daily F/u in 3 week to adjust further - sooner if needed  Will look into seeing a psychiatrist and a therapist

## 2020-06-17 NOTE — Assessment & Plan Note (Signed)
Chronic Had pain and swelling in right ankle - probable gout Restart allopurinol - we had stopped it for fear it was causing fatigue - there was no change Allopurinol 300 mg daily

## 2020-06-17 NOTE — Chronic Care Management (AMB) (Deleted)
Chronic Care Management Pharmacy  Name: Jonathan [name]  MRN: [id number] DOB: 15-May-1933   Chief Complaint/ HPI  Jonathan [name],  87 y.o. , male presents for their Initial CCM visit with the clinical pharmacist In office.  PCP : Binnie Rail, MD Patient Care Team: Binnie Rail, MD as PCP - General (Internal Medicine) Stanford Breed Denice Bors, MD as PCP - Cardiology (Cardiology) Council Mechanic, MD as Referring Physician (Family Medicine) Earnie Larsson, MD as Consulting Physician (Neurosurgery) Stanford Breed Denice Bors, MD as Consulting Physician (Cardiology) Michael Boston, MD as Consulting Physician (General Surgery) Karen Kays, NP as Nurse Practitioner (Nurse Practitioner) [name], [facility] as Pharmacist (Pharmacist)  Their chronic conditions include: Hypertension, Hyperlipidemia, Diabetes, Atrial Fibrillation, GERD, COPD, Hypothyroidism, Depression, Osteoarthritis, BPH, Gout and Allergic Rhinitis, Glaucoma   Office Visits: 09/13/19 Dr Quay Burow OV: acute visit for nausea, may be related to constipation, rec'd Miralax daily.  08/14/19 Dr Quay Burow OV: chronic f/u. Alternating Ambien and Sonata (1 rx at a time). Advised to increase metformin to BID.  Consult Visit: 01/30/20 Dr Stanford Breed (cardiology): Afib in sinus rhythm. No med changes.  11/28/19 Dr Tonita Cong (ortho surgery): f/u for thoracic spine pain. Pt going to PT.  09/12/19 Dr Dwaine Gale (psychiatry): f/u for MDD. Continue same meds - duloxetine and bupropion. Increased bupropion to BID 2 weeks prior.  Allergies  Allergen Reactions  . Other Other (See Comments)    DUST-Other reaction(s): Respiratory distress    Medications: Outpatient Encounter Medications as of 06/17/2020  Medication Sig  . acetaminophen (TYLENOL) 650 MG CR tablet Take 650 mg by mouth in the morning and at bedtime. Pt takes 2 tabs 2 times daily  . apixaban (ELIQUIS) 5 MG TABS tablet Take 1 tablet (5 mg total) by mouth 2 (two) times daily.  Marland Kitchen atorvastatin (LIPITOR)  20 MG tablet Take 1 tablet (20 mg total) by mouth daily.  . bimatoprost (LUMIGAN) 0.01 % SOLN Place 1 drop into both eyes at bedtime.   . Biotin 5000 MCG TABS Take 5,000 mcg by mouth daily.  . calcium-vitamin D (OSCAL WITH D) 500-200 MG-UNIT tablet Take 1 tablet by mouth daily with breakfast.  . Capsaicin 0.1 % CREA Apply topically.  . cefUROXime (CEFTIN) 250 MG tablet Take 1 tablet (250 mg total) by mouth 2 (two) times daily with a meal.  . colchicine 0.6 MG tablet Take 0.6 mg by mouth daily as needed (flareups).   . diltiazem (CARDIZEM CD) 300 MG 24 hr capsule Take 1 capsule (300 mg total) by mouth daily.  Marland Kitchen diltiazem (CARDIZEM) 30 MG tablet Take 1 tablet (30 mg total) by mouth 2 (two) times daily as needed (sustained elevated heart rates >100 bpm for > 20 minutes.).  Marland Kitchen doxazosin (CARDURA) 8 MG tablet Take 1 tablet (8 mg total) by mouth daily.  . DULoxetine (CYMBALTA) 60 MG capsule Take 1 capsule (60 mg total) by mouth daily.  Marland Kitchen glucose blood test strip Use to check blood sugar daily. E11.9  . hydrochlorothiazide (HYDRODIURIL) 25 MG tablet Take 1 tablet (25 mg total) by mouth daily.  Marland Kitchen KLOR-CON M20 20 MEQ tablet TAKE 1 TABLET BY MOUTH EVERY DAY (Patient taking differently: Take 20 mEq by mouth daily.)  . Lancets MISC Use to check blood sugars daily. E11.9  . levothyroxine (SYNTHROID) 112 MCG tablet Take 1 tablet (112 mcg total) by mouth daily before breakfast.  . LORazepam (ATIVAN) 0.5 MG tablet Take 1 tablet (0.5 mg total) by mouth 2 (two) times daily as needed  for anxiety.  . magnesium oxide (MAG-OX) 400 MG tablet Take 400 mg by mouth daily.  . Melatonin 5 MG TABS Take 5 mg by mouth at bedtime.  . metFORMIN (GLUCOPHAGE-XR) 750 MG 24 hr tablet Take 1 tablet (750 mg total) by mouth in the morning and at bedtime.  . mometasone (NASONEX) 50 MCG/ACT nasal spray Place 2 sprays into the nose daily. (Patient taking differently: Place 2 sprays into the nose as needed (allergies).)  . Multiple Vitamin  (MULTIVITAMIN) tablet Take 1 tablet by mouth daily.  . nitroGLYCERIN (NITROSTAT) 0.4 MG SL tablet Place 1 tablet (0.4 mg total) under the tongue every 5 (five) minutes as needed for chest pain.  Marland Kitchen omeprazole (PRILOSEC) 20 MG capsule TAKE 1 CAPSULE BY MOUTH EVERY DAY  . RESTASIS 0.05 % ophthalmic emulsion Place 1 drop into both eyes 2 (two) times daily.   . sildenafil (REVATIO) 20 MG tablet Take 40-100 mg by mouth daily as needed.  . solifenacin (VESICARE) 10 MG tablet Take 10 mg by mouth daily.  . vitamin C (ASCORBIC ACID) 500 MG tablet Take 500 mg by mouth 2 (two) times daily.   Marland Kitchen VITAMIN D, CHOLECALCIFEROL, PO Take 25 mcg by mouth daily.    No facility-administered encounter medications on file as of 06/17/2020.    Wt Readings from Last 3 Encounters:  06/12/20 196 lb (88.9 kg)  04/26/20 204 lb (92.5 kg)  04/18/20 200 lb (90.7 kg)    Current Diagnosis/Assessment:    Goals Addressed   None     AFIB   Patient is currently rate controlled. Office heart rates are  Pulse Readings from Last 3 Encounters:  06/12/20 (!) 57  06/11/20 84  04/26/20 63   CHA2DS2-VASc Score = 4  The patient's score is based upon: CHF History: No HTN History: Yes Diabetes History: Yes Stroke History: No Vascular Disease History: No  Kidney Function Lab Results  Component Value Date/Time   CREATININE 1.16 06/12/2020 11:31 AM   CREATININE 1.04 03/14/2020 12:58 PM   CREATININE 1.07 09/02/2016 11:44 AM   CREATININE 0.99 08/17/2015 01:08 PM   GFR 57.63 (L) 06/12/2020 11:31 AM   GFRNONAA >60 03/14/2020 12:58 PM   GFRNONAA 82 04/29/2015 02:30 PM   GFRAA >60 03/14/2020 12:58 PM   GFRAA >89 04/29/2015 02:30 PM   K 3.6 06/12/2020 11:31 AM   K 4.2 03/14/2020 12:58 PM   Patient has failed these meds in past: n/a Patient is currently controlled on the following medications:  . Diltiazem 300 mg daily . Eliquis 5 mg BID  We discussed:  Benefits of medications for prevention of stroke and maintenance  of sinus rhythm; pt reports Eliquis is $300 every time he fills it (not just in deductible or coverage gap). Per patient reported income and spending he should qualify for patient assistance. Will work with prescriber and patient to complete and submit application.  Plan  Continue current medications  Pursue Eliquis PAP (with cardiology)  Hypertension   BP goal is:  <140/90  Office blood pressures are  BP Readings from Last 3 Encounters:  06/12/20 140/68  06/11/20 128/76  04/26/20 134/82   Kidney Function Lab Results  Component Value Date/Time   CREATININE 1.16 06/12/2020 11:31 AM   CREATININE 1.04 03/14/2020 12:58 PM   CREATININE 1.07 09/02/2016 11:44 AM   CREATININE 0.99 08/17/2015 01:08 PM   GFR 57.63 (L) 06/12/2020 11:31 AM   GFRNONAA >60 03/14/2020 12:58 PM   GFRNONAA 82 04/29/2015 02:30 PM  GFRAA >60 03/14/2020 12:58 PM   GFRAA >89 04/29/2015 02:30 PM   K 3.6 06/12/2020 11:31 AM   K 4.2 03/14/2020 12:58 PM   Patient checks BP at home daily Patient home BP readings are ranging: 130/85, HR 70s  Patient has failed these meds in the past: n/a Patient is currently controlled on the following medications:  . HCTZ 25 mg daily . Doxazosin 8 mg daily . Diltiazem 300 mg daily  We discussed diet and exercise extensively; benefits of medications for prevention of heart attack, stroke and renal damage. Pt denies side effects  Plan  Continue current medications and control with diet and exercise    Hyperlipidemia   LDL goal < 100  Lipid Panel     Component Value Date/Time   CHOL 111 08/14/2019 1028   TRIG 211.0 (H) 08/14/2019 1028   HDL 37.60 (L) 08/14/2019 1028   LDLCALC 66 11/11/2018 1114   LDLDIRECT 51.0 08/14/2019 1028    Hepatic Function Latest Ref Rng & Units 06/12/2020 03/14/2020 09/18/2019  Total Protein 6.0 - 8.3 g/dL 6.7 6.7 6.7  Albumin 3.5 - 5.2 g/dL 4.6 4.5 4.5  AST 0 - 37 U/L 24 24 13   ALT 0 - 53 U/L 29 21 18   Alk Phosphatase 39 - 117 U/L 102 88  91  Total Bilirubin 0.2 - 1.2 mg/dL 0.5 0.4 0.5     The ASCVD Risk score ([place]., et al., 2013) failed to calculate for the following reasons:   The 2013 ASCVD risk score is only valid for ages 36 to 59   Patient has failed these meds in past: n/a Patient is currently controlled on the following medications:  . Atorvastatin 20 mg daily . Nitroglycerin 0.4 mg SL prn  We discussed:  diet and exercise extensively; Cholesterol goals; benefits of statin for ASCVD risk reduction; pt has never had to use nitroglycerin; he denies side effects  Plan  Continue current medications and control with diet and exercise  Diabetes   A1c goal <8%  Recent Relevant Labs: Lab Results  Component Value Date/Time   HGBA1C 7.5 (H) 06/12/2020 11:31 AM   HGBA1C 8.1 (H) 03/14/2020 09:45 PM   GFR 57.63 (L) 06/12/2020 11:31 AM   GFR 68.75 09/18/2019 09:40 AM   MICROALBUR 28.1 (H) 08/11/2017 08:48 AM   MICROALBUR 21.2 (H) 12/08/2016 11:43 AM    Last diabetic Eye exam:  Lab Results  Component Value Date/Time   HMDIABEYEEXA No Retinopathy 02/01/2019 12:00 AM    Last diabetic Foot exam: No results found for: HMDIABFOOTEX   Checking BG: Weekly  Recent FBG Readings: 150  Patient has failed these meds in past: n/a Patient is currently controlled on the following medications: Marland Kitchen Metformin ER 750 mg daily . Testing supplies  We discussed: diet and exercise extensively; A1c and BG goals; benefits of increasing metformin dose; pt was not aware PCP had advised this after most recent visit, he is agreeable to increasing the dose.   Plan  Continue control with diet and exercise  Recommend to increase metformin to 750 mg BID (previously advised by PCP)   COPD   No spirometry on file  Gold Grade: n/a Current COPD Classification:  A (low sx, <2 exacerbations/yr)   Lab Results  Component Value Date/Time   EOSPCT 0.4 06/12/2020 11:31 AM   EOSABS 0.0 06/12/2020 11:31 AM   Patient has failed  these meds in past: Stiolto, Symbicort, Dulera, Xopenex Patient is currently controlled on the following medications:  .  No medications  Using maintenance inhaler regularly? No Frequency of rescue inhaler use:  never  We discussed:  Pt denies issues with breathing, wheezing, coughing; he could not afford inhalers previously and he would like to continue to monitor symptoms without treatment at this time  Plan  Continue to monitor symptoms  Hypothyroidism   Lab Results  Component Value Date/Time   TSH 2.58 06/12/2020 11:31 AM   TSH 3.13 08/14/2019 10:28 AM   Patient has failed these meds in past: n/a Patient is currently controlled on the following medications:  . Levothyroxine 112 mcg daily  We discussed:  Pt takes levothyroxine 30 minutes prior to eating or other meds in the morning  Plan  Continue current medications  Depression / Insomnia   Depression screen [facility] 2/9 06/12/2020 04/26/2020 08/14/2019  Decreased Interest 1 0 2  Down, Depressed, Hopeless 2 0 2  PHQ - 2 Score 3 0 4  Altered sleeping 3 - 3  Tired, decreased energy 0 - 2  Change in appetite 0 - 2  Feeling bad or failure about yourself  0 - 2  Trouble concentrating 0 - 3  Moving slowly or fidgety/restless 0 - 0  Suicidal thoughts 0 - 0  PHQ-9 Score 6 - 16  Difficult doing work/chores - - -  Some recent data might be hidden    Patient has failed these meds in past: bupropion, sertraline, Belsomra, trazodone Patient is currently controlled on the following medications:  . Duloxetine 60 mg BID . Zaleplon 10 mg HS (not taking) . Zolpidem 10 mg HS prn  We discussed:  Pt reports improvement in mood since increasing duloxetine in August. He alternates between zaleplon and zolpidem for sleep, currently taking zolpidem with moderate efficacy  Plan  Continue current medications  GERD   Patient has failed these meds in past: n/a Patient is currently controlled on the following medications:  . Omeprazole  20 mg daily  We discussed:  Pt denies issues with heartburn/reflux and is interested in reducing medications; advised to taper PPI before stopping, and monitor symptoms, he may restart PPI at any time  Plan  Recommended to reduce omeprazole to every other day for 1 week, then stop  BPH / OAB   No results found for: PSA   Patient has failed these meds in past: n/a Patient is currently uncontrolled on the following medications:  . Solifenacin 5 mg daily  - not taking . Doxazosin 8 mg daily  We discussed:  Benefits of solifenacin for OAB; pt had confused solifenacin with sildenafil so he was not taking it regularly; he does report 2-3 episodes of nighttime urination  Plan  Recommended to restart solifenacin 5 mg at bedtime  Chronic pain   Spondylosthesis Osteoarthritis Compression fracture of vertebra Degenerative joint disease Fibromyalgia  Patient has failed these meds in past: n/a Patient is currently controlled on the following medications:  . Tylenol 500 mg q8h PRN . Oxycodone-APAP 10-325 mg q8h PRN . Oxycodone 15 mg - 1/2 tablet PRN . Duloxetine 60 mg BID . Voltaren gel PRN  We discussed:  Pt endorses long history of chronic pain since he was a young adult; currently pain is relatively controlled with medications and injections via orthopedist; he was not using Voltaren gel regularly so advised him to use it daily for joint pains  Plan  Continue current medications  Gout     Ref. Range 02/25/2012 17:32 11/23/2012 09:33  Uric Acid, Serum Latest Ref Range: 4.0 - 7.8 mg/dL  5.0 4.8   Patient has failed these meds in past: n/a Patient is currently controlled on the following medications:  . Allopurinol 300 mg daily . Colchicine 0.6 mg PRN  We discussed:  Pt reports he has not had a gout flare in many years; he is interested in taking less medication so discussed the risks of stopping allopurinol including return of gout flares; discussed low purine diet; pt would  like to try a period off of allopurinol   Plan  Recommend trial period off allopurinol Low purine diet (handout provided)  Allergic rhinitis   Patient has failed these meds in past: n/a Patient is currently controlled on the following medications:  Marland Kitchen Mometasone nasal spray PRN  We discussed:  Patient is satisfied with current regimen and denies issues  Plan  Continue current medications  Glaucoma   Patient has failed these meds in past: n/a Patient is currently controlled on the following medications:  . Bimatoprost (Lumigan) 0.01% eye drops HS . Dorzolamide-timolol (Cosopt) eye drops BID . Restasis 0.05% eye drops BID -not taking  We discussed: Pt reports eye drops are very expensive. Consulted insurance formulary - latanoprost is the preferred product for $0 copay. Pt is interested in more cost-effective options.  Plan  Consider switch from Bimatoprost to Latanoprost for cost savings - will contact ophthalmologist   Health Maintenance   Patient is currently controlled on the following medications:  . Biotin 5000 mcg . Calcium-Vitamin D 500-200 mg-unit . Klor-con 20 mEq daily . Multivitamin . Vitamin C 500 mg . Vitamin D 1000 IU . Magnesium oxide 400 mg daily  We discussed:  Patient is satisfied with current OTC regimen and denies issues  Plan  Continue current medications  Medication Management   Pt uses [facility] for all medications Uses pill box? No - keeps in bottles and marks AM or PM Pt endorses 90% compliance  We discussed: Discussed benefits of medication synchronization, packaging and delivery as well as enhanced pharmacist oversight with [employer]. He is interested however he has just recently switched to [place] for cost savings with [facility] - he reports nearly all meds are cheaper with [facility] than through insurance. Advised patient to meet with [facility] counselor to choose a more optimal insurance plan given his affordability  issues.  Plan  Continue current medication management strategy  Pt to meet with [facility] counselor to choose more cost-effective insurance plan    Follow up: *** month phone visit  Charlene Brooke, PharmD, [facility] Clinical Pharmacist [facility] [phone]

## 2020-06-18 ENCOUNTER — Ambulatory Visit: Payer: Medicare Other | Attending: Internal Medicine

## 2020-06-18 VITALS — BP 142/78

## 2020-06-18 DIAGNOSIS — M6281 Muscle weakness (generalized): Secondary | ICD-10-CM

## 2020-06-18 DIAGNOSIS — R2681 Unsteadiness on feet: Secondary | ICD-10-CM | POA: Insufficient documentation

## 2020-06-18 DIAGNOSIS — R2689 Other abnormalities of gait and mobility: Secondary | ICD-10-CM | POA: Insufficient documentation

## 2020-06-18 NOTE — Progress Notes (Unsigned)
Virtual Visit via Video Note   This visit type was conducted due to national recommendations for restrictions regarding the COVID-19 Pandemic (e.g. social distancing) in an effort to limit this patient's exposure and mitigate transmission in our community.  Due to his co-morbid illnesses, this patient is at least at moderate risk for complications without adequate follow up.  This format is felt to be most appropriate for this patient at this time.  All issues noted in this document were discussed and addressed.  A limited physical exam was performed with this format.  Please refer to the patient's chart for his consent to telehealth for [facility].      Date:  07/02/2020   ID:  Jonathan [name], DOB August 13, 1932, MRN [id number]  Patient Location:Home Provider Location: Home  PCP:  Binnie Rail, MD  Cardiologist:  Dr Stanford Breed  Evaluation Performed:  Follow-Up Visit  Chief Complaint:  FU atrial fibrillation  History of Present Illness:    HPI: FU atrial fibrillation. Previously with a fib but converted spontaneously to sinus rhythm. Patient also with significant COPD. Abdominal ultrasound March 2015 showed no aneurysm. Nuclear study July 2016 showed ejection fraction 56% and normal perfusion. Echocardiogram repeated August 2018 and showed normal LV function, grade 2 diastolic dysfunction and mild left atrial enlargement. Had fall September 2019 resulting in subdural hematoma.Again admitted 9/21 after falling and had subdural. Monitor 12/21 showed sinus with pacs, pvcs, paf pat and 4 beats NSVT. Since I last sawhim,he denies dyspnea, CP or syncope; no recurrent falls.   The patient does not have symptoms concerning for COVID-19 infection (fever, chills, cough, or new shortness of breath).    Past Medical History:  Diagnosis Date  . Atrial fibrillation (HCC)    a. paroxysmal, on Eliquis for anticoagulation  . BPH (benign prostatic hyperplasia)   . Bronchitis   . Cataract   .  COPD (chronic obstructive pulmonary disease) (HCC)    2 liters O2 HS  . Depression   . Fatty tumor    waste and back  . Fibromyalgia   . GERD (gastroesophageal reflux disease)   . Glaucoma    bilateral eyes  . Gout   . Hereditary and idiopathic peripheral neuropathy 08/28/2015  . Hypertension   . Hypothyroidism   . Hypoxia   . Insomnia   . Osteoarthritis   . Osteoporosis   . Peripheral neuropathy   . Sleep apnea    uses cpap-add oxygen at night  . Spinal compression fracture (HCC) seventh vertebre  . Transient alteration of awareness 08/28/2015  . Type 2 diabetes mellitus ([place]) 03/08/2016   Past Surgical History:  Procedure Laterality Date  . APPENDECTOMY  age 82  . CARPAL TUNNEL RELEASE Right    early 2000s  . CATARACT EXTRACTION     bilateral  . CHOLECYSTECTOMY  age 60  . EYE SURGERY    . FOOT ARTHRODESIS Right 02/02/2013   Procedure: RIGHT HALLUX METATARSAL PHALANGEAL JOINT ARTHRODESIS ;  Surgeon: Wylene Simmer, MD;  Location: [facility];  Service: Orthopedics;  Laterality: Right;  . INGUINAL HERNIA REPAIR  age 18   rt side  . NASAL CONCHA BULLOSA RESECTION  age 74  . PROSTATE SURGERY    . SHOULDER ARTHROSCOPY W/ ROTATOR CUFF REPAIR Right    early 2000s  . tonsil    . VASECTOMY  age 29     Current Meds  Medication Sig  . acetaminophen (TYLENOL) 650 MG CR tablet Take 650 mg by mouth  in the morning and at bedtime. Pt takes 2 tabs 2 times daily  . allopurinol (ZYLOPRIM) 300 MG tablet Take 1 tablet (300 mg total) by mouth every evening. For gout  . apixaban (ELIQUIS) 5 MG TABS tablet Take 1 tablet (5 mg total) by mouth 2 (two) times daily.  Marland Kitchen atorvastatin (LIPITOR) 20 MG tablet Take 1 tablet (20 mg total) by mouth daily. For cholesterol  . bimatoprost (LUMIGAN) 0.01 % SOLN Place 1 drop into both eyes at bedtime.   . Biotin 5000 MCG TABS Take 5,000 mcg by mouth daily.  . calcium-vitamin D (OSCAL WITH D) 500-200 MG-UNIT tablet Take 1 tablet by mouth daily  with breakfast.  . colchicine 0.6 MG tablet Take 0.6 mg by mouth daily as needed (flareups).   . diltiazem (CARDIZEM CD) 300 MG 24 hr capsule Take 1 capsule (300 mg total) by mouth daily.  Marland Kitchen diltiazem (CARDIZEM) 30 MG tablet Take 1 tablet (30 mg total) by mouth 2 (two) times daily as needed (sustained elevated heart rates >100 bpm for > 20 minutes.).  Marland Kitchen doxazosin (CARDURA) 8 MG tablet Take 1 tablet (8 mg total) by mouth daily.  Marland Kitchen FLUoxetine (PROZAC) 20 MG tablet Take 1 tablet (20 mg total) by mouth daily. Start after you are off the cymbalta (Patient taking differently: Take 30 mg by mouth daily. Start after you are off the cymbalta)  . glucose blood test strip Use to check blood sugar daily. E11.9  . hydrochlorothiazide (HYDRODIURIL) 25 MG tablet Take 1 tablet (25 mg total) by mouth daily. For blood pressure  . Lancets MISC Use to check blood sugars daily. E11.9  . levothyroxine (SYNTHROID) 112 MCG tablet Take 1 tablet (112 mcg total) by mouth daily before breakfast. For thyroid  . magnesium oxide (MAG-OX) 400 MG tablet Take 400 mg by mouth daily.  . Melatonin 5 MG TABS Take 5 mg by mouth at bedtime.  . metFORMIN (GLUCOPHAGE-XR) 750 MG 24 hr tablet Take 1 tablet (750 mg total) by mouth in the morning and at bedtime. For diabetes  . mometasone (NASONEX) 50 MCG/ACT nasal spray Place 2 sprays into the nose daily. (Patient taking differently: Place 2 sprays into the nose as needed (allergies).)  . Multiple Vitamin (MULTIVITAMIN) tablet Take 1 tablet by mouth daily.  . nitroGLYCERIN (NITROSTAT) 0.4 MG SL tablet Place 1 tablet (0.4 mg total) under the tongue every 5 (five) minutes as needed for chest pain.  Marland Kitchen omeprazole (PRILOSEC) 20 MG capsule TAKE 1 CAPSULE BY MOUTH EVERY DAY.  For heartburn  . potassium chloride SA (KLOR-CON M20) 20 MEQ tablet Take 1 tablet (20 mEq total) by mouth daily. For low potassium  . RESTASIS 0.05 % ophthalmic emulsion Place 1 drop into both eyes 2 (two) times daily.   .  sildenafil (REVATIO) 20 MG tablet Take 40-100 mg by mouth daily as needed.  . solifenacin (VESICARE) 10 MG tablet Take 10 mg by mouth daily.  . vitamin C (ASCORBIC ACID) 500 MG tablet Take 500 mg by mouth 2 (two) times daily.   Marland Kitchen VITAMIN D, CHOLECALCIFEROL, PO Take 25 mcg by mouth daily.      Allergies:   Other   Social History   Tobacco Use  . Smoking status: Former Smoker    Packs/day: 2.00    Years: 10.00    Pack years: 20.00    Types: Cigarettes    Quit date: 07/18/1975    Years since quitting: 44.9  . Smokeless tobacco: Never Used  Vaping  Use  . Vaping Use: Never used  Substance Use Topics  . Alcohol use: Yes    Comment: 1-2 drinks/day  . Drug use: No     Family Hx: The patient's family history includes Arthritis in his mother; Coronary artery disease in his brother; Dementia in his mother; Heart disease in his father; Kidney cancer in his father; Lung cancer in his father and mother; Prostate cancer in his father.  ROS:   Please see the history of present illness.    No Fever, chills  or productive cough All other systems reviewed and are negative.  Recent Labs: 06/12/2020: ALT 29; BUN 22; Creatinine, Ser 1.16; Hemoglobin 13.4; Platelets 120.0; Potassium 3.6; Sodium 138; TSH 2.58   Recent Lipid Panel Lab Results  Component Value Date/Time   CHOL 111 08/14/2019 10:28 AM   TRIG 211.0 (H) 08/14/2019 10:28 AM   HDL 37.60 (L) 08/14/2019 10:28 AM   CHOLHDL 3 08/14/2019 10:28 AM   LDLCALC 66 11/11/2018 11:14 AM   LDLDIRECT 51.0 08/14/2019 10:28 AM    Wt Readings from Last 3 Encounters:  07/02/20 196 lb (88.9 kg)  06/17/20 198 lb 3.2 oz (89.9 kg)  06/12/20 196 lb (88.9 kg)     Objective:    Vital Signs:  Ht 5\' 7"  (1.702 m)   Wt 196 lb (88.9 kg)   BMI 30.70 kg/m    VITAL SIGNS:  reviewed NAD Answers questions appropriately Normal affect Remainder of physical examination not performed (telehealth visit; coronavirus pandemic)  ASSESSMENT & PLAN:     1  PAF-based on history patient remains in sinus rhythm. Continue Cardizem at present dose for rate control if atrial fibrillation recurs. We discussed anticoagulation today. He has had previous TIAs and has multiple embolic risk factors. However he has fallen twice previously. He states he typically does not fall and does not have balance issues. For now we will continue with apixaban. If he has additional falls in the future we will need to discontinue.  2 hypertension-BP controlled; continue present meds and follow.  3 hyperlipidemia-continue statin.  4 OSA-continue CPAP.   COVID-19 Education: The importance of social distancing was discussed today.  Time:   Today, I have spent 17 minutes with the patient with telehealth technology discussing the above problems.     Medication Adjustments/Labs and Tests Ordered: Current medicines are reviewed at length with the patient today.  Concerns regarding medicines are outlined above.   Tests Ordered: No orders of the defined types were placed in this encounter.   Medication Changes: No orders of the defined types were placed in this encounter.   Follow Up:  In Person in 6 month(s)  Signed, Kirk Ruths, MD  07/02/2020 8:53 AM    [place]

## 2020-06-18 NOTE — Therapy (Signed)
[facility] [address] [place], [place], [address] Phone: [phone]   Fax:  [phone]  Physical Therapy Treatment  Patient Details  Name: Samaj Wessells MRN: [id number] Date of Birth: 14-Oct-1932 Referring Provider (PT): Cheryll Cockayne, MD   Encounter Date: 06/18/2020   PT End of Session - 06/18/20 1022    Visit Number 14    Number of Visits 18    Date for PT Re-Evaluation 07/27/20   POC for 4 weeks, Cert for 60 days   Authorization Type Medicare (10th Visit PN)    Progress Note Due on Visit 20    PT Start Time 1023   pt arriving late   PT Stop Time 1100    PT Time Calculation (min) 37 min    Equipment Utilized During Treatment Gait belt    Activity Tolerance Patient tolerated treatment well    Behavior During Therapy WFL for tasks assessed/performed           Past Medical History:  Diagnosis Date  . Atrial fibrillation (HCC)    a. paroxysmal, on Eliquis for anticoagulation  . BPH (benign prostatic hyperplasia)   . Bronchitis   . Cataract   . COPD (chronic obstructive pulmonary disease) (HCC)    2 liters O2 HS  . Depression   . Fatty tumor    waste and back  . Fibromyalgia   . GERD (gastroesophageal reflux disease)   . Glaucoma    bilateral eyes  . Gout   . Hereditary and idiopathic peripheral neuropathy 08/28/2015  . Hypertension   . Hypothyroidism   . Hypoxia   . Insomnia   . Osteoarthritis   . Osteoporosis   . Peripheral neuropathy   . Sleep apnea    uses cpap-add oxygen at night  . Spinal compression fracture (HCC) seventh vertebre  . Transient alteration of awareness 08/28/2015  . Type 2 diabetes mellitus (HCC) 03/08/2016    Past Surgical History:  Procedure Laterality Date  . APPENDECTOMY  age 79  . CARPAL TUNNEL RELEASE Right    early 2000s  . CATARACT EXTRACTION     bilateral  . CHOLECYSTECTOMY  age 84  . EYE SURGERY    . FOOT ARTHRODESIS Right 02/02/2013   Procedure: RIGHT  HALLUX METATARSAL PHALANGEAL JOINT ARTHRODESIS ;  Surgeon: Toni Arthurs, MD;  Location: [facility];  Service: Orthopedics;  Laterality: Right;  . INGUINAL HERNIA REPAIR  age 78   rt side  . NASAL CONCHA BULLOSA RESECTION  age 27  . PROSTATE SURGERY    . SHOULDER ARTHROSCOPY W/ ROTATOR CUFF REPAIR Right    early 2000s  . tonsil    . VASECTOMY  age 43    Vitals:   06/18/20 1031  BP: (!) 142/78     Subjective Assessment - 06/18/20 1026    Subjective Patient reports that he has feeling terrible. Continues to see MD and have medications changes, saw Dr Lawerance Bach yesterday. Small improvement in anxiety, but has not noticed much of a difference. No falls to report. Back is bothering him this morning.    Pertinent History pAF on Eliquis, chronic pain on oxycodone, previous SDH, COPD on home O2 noct, HTN, hypothyroidism, OSA on CPAP, and DM, Neuropathy    Limitations Standing;Walking    Patient Stated Goals Improve the Balance; Improve Core Strength    Currently in Pain? Yes    Pain Score 8     Pain Location Back    Pain Orientation Lower  Pain Descriptors / Indicators Aching    Pain Type Chronic pain    Pain Onset Yesterday    Pain Onset Yesterday                             [facility] Adult PT Treatment/Exercise - 06/18/20 0001      Transfers   Transfers Sit to Stand;Stand to Sit    Sit to Stand 5: Supervision    Stand to Sit 5: Supervision      Ambulation/Gait   Ambulation/Gait Yes    Ambulation/Gait Assistance 5: Supervision    Ambulation/Gait Assistance Details throughout therapy gym with activities    Assistive device None    Gait Pattern Step-through pattern    Ambulation Surface Level;Indoor      Self-Care   Self-Care Other Self-Care Comments    Other Self-Care Comments  Patient reporting continue to have anxiety attacks, suddenly falling asleep. Patient demo lack of focus, and increased drowsiness during session. Patient reporting that he is  not drinking water primarily consuming soft drinks. PT provided water to patient and PT educating that patient should increased consumption of water as soft drinks actually dehydrate you. PT also educating for patient to monitor blood sugar.               Balance Exercises - 06/18/20 1048      Balance Exercises: Standing   Standing Eyes Opened Narrow base of support (BOS);Head turns;Foam/compliant surface;Limitations    Standing Eyes Opened Limitations standing wiht narrow BOS on blue airex, completed 2 x 10 reps each horizontal/vertical. patient require redirection to task. progresed narrow BOS as able. increased sway and intemrittent UE support required    Standing Eyes Closed Wide (BOA);Foam/compliant surface;3 reps;30 secs    Standing Eyes Closed Limitations standing on airex, completed 3 x 30 seconds. patient unable to complete iwth narrow BOS due to freq LOB posterior requiring MIn A from PT. completed with feet shoulder width apart.    Tandem Stance Eyes open;Upper extremity support 1;Intermittent upper extremity support;3 reps;20 secs    Tandem Stance Time completed on firm surface, completed partial tandem 2 x 30 seconds bilaterally. unable to complete full tandem due today.    Wall Bumps Hip;Eyes opened;Limitations    Wall Bumps Limitations completed wall bumps on firm surface x 10 reps, increased verbal cues required for proper completion.    Other Standing Exercises completed alternating stepping over orange hurdles x 4 laps down and back at countertop. intermitent UE support and CGA. verbal cues for slowed pace, increased balance challenge when patient tried to complete quickly.             PT Education - 06/18/20 1149    Education Details education water intake; monitor blood sugar    Person(s) Educated Patient    Methods Explanation    Comprehension Verbalized understanding            PT Short Term Goals - 05/28/20 1350      PT SHORT TERM GOAL #1   Title = LTGs              PT Long Term Goals - 05/28/20 1350      PT LONG TERM GOAL #1   Title Patient will be independent with final balance/strengthening HEP (All LTGs Due: 06/28/20)    Baseline continue to progress HEP, completing 2-3 days/week    Time 4    Period Weeks    Status  Revised    Target Date 06/28/20      PT LONG TERM GOAL #2   Title Patient will improve FGA to >/= 24/30 to demonstrate reduced fall risk.    Baseline 19/30, 22/30 on 05/23/20    Time 4    Period Weeks    Status Revised      PT LONG TERM GOAL #3   Title Patient will demo ability to complete situation 4 of M-CTSIB for >/= 20 seconds    Baseline 6 seconds on situation 4    Time 4    Period Weeks    Status Revised      PT LONG TERM GOAL #4   Title Patient will improve TUG to </= 10 secs to demonstrate improved functional mobility and reduced fall risk    Baseline 12.16, 11.69 seconds    Time 4    Period Weeks    Status Revised      PT LONG TERM GOAL #5   Title --    Baseline --    Time --    Period --    Status --                 Plan - 06/18/20 1156    Clinical Impression Statement Today's skilled PT session included continued balance activites on complaint surfaces, increased challenge noted today. Patient require freq verbal cues and difficulty with attention to exercises today. Patietn often having 1-2 instances of closing eyes and increased anxiety. Vitals normal throughout session. PT providing education to patient regarding water intake. PT believe may need to place PT services on hold next session due to concerns.    Personal Factors and Comorbidities Comorbidity 3+    Comorbidities pAF on Eliquis, chronic pain on oxycodone and Ambien, previous SDH, COPD on home O2 noct, HTN, hypothyroidism, OSA on CPAP, and DM    Examination-Activity Limitations Locomotion Level;Stairs    Examination-Participation Restrictions Community Activity;Yard Work    [employer]  Evolving/Moderate complexity    Rehab Potential Good    PT Frequency 2x / week    PT Duration 4 weeks    PT Treatment/Interventions ADLs/Self Care Home Management;Electrical Stimulation;DME Instruction;Stair training;Functional mobility training;Gait training;Therapeutic activities;Therapeutic exercise;Balance training;Neuromuscular re-education;Patient/family education;Vestibular    PT Next Visit Plan How has patient been feeling? SLS, continue balance exercsies (complaint surfaces), gait/balance with dual tasking/cognitive challenge, tandem walking. strengtening exercises. Sci[employer]    Consulted and Agree with Plan of Care Patient           Patient will benefit from skilled therapeutic intervention in order to improve the following deficits and impairments:  Decreased balance,Decreased strength,Pain,Impaired sensation,Difficulty walking,Decreased endurance,Abnormal gait  Visit Diagnosis: Unsteadiness on feet  Other abnormalities of gait and mobility  Muscle weakness (generalized)     Problem List Patient Active Problem List   Diagnosis Date Noted  . Acute sinusitis 06/12/2020  . Anxiety 06/12/2020  . Recurrent falls 03/25/2020  . SDH (subdural hematoma) ([place]) 03/21/2020  . Intracranial hemorrhage ([place]) 03/14/2020  . Fall at home, subsequent encounter 03/14/2020  . Leukocytosis 03/14/2020  . Nausea 09/13/2019  . Rotator cuff arthropathy of left shoulder 07/21/2019  . Dupuytren's disease of palm 07/12/2019  . Sebaceous cyst 06/27/2019  . COVID-19 05/08/2019  . Acute encephalopathy 05/08/2019  . Obstructive sleep apnea   . Asymmetrical left sensorineural hearing loss 07/12/2018  . Chronic anticoagulation 12/21/2017  . Compression fracture of thoracic vertebra ([place]) 09/21/2017  . Degeneration of thoracic intervertebral disc 09/21/2017  .  Thoracic back pain 08/10/2017  . Degenerative joint disease of hand 07/14/2017  . Osteoarthritis   . Hypoxia   . Fibromyalgia   . Fatty  tumor   . Cataract   . BPH (benign prostatic hyperplasia)   . Chronic atrial fibrillation ([place])   . Insomnia 03/10/2017  . Chronic pain, legs and back 03/10/2017  . Hyperlipidemia 03/10/2017  . Obesity (BMI 30.0-34.9) 12/08/2016  . Depression 09/07/2016  . Spondylolisthesis 09/07/2016  . Hypothyroidism 03/08/2016  . GERD (gastroesophageal reflux disease) 03/08/2016  . Gout 03/08/2016  . Glaucoma 03/08/2016  . Type 2 diabetes mellitus ([place]) 03/08/2016  . Hereditary and idiopathic peripheral neuropathy 08/28/2015  . Essential hypertension 12/04/2011  . Allergic rhinitis, seasonal 12/04/2011  . COPD (chronic obstructive pulmonary disease) ([place]) 07/18/2011    Jones Bales, PT, DPT 06/18/2020, 12:00 PM  [facility] [address] [place], [name], [address] Phone: [phone]   Fax:  [phone]  Name: Lloyd Tolly MRN: [id number] Date of Birth: 1933-02-04

## 2020-06-20 ENCOUNTER — Encounter: Payer: Self-pay | Admitting: Physical Therapy

## 2020-06-20 ENCOUNTER — Ambulatory Visit: Payer: Medicare Other | Admitting: Physical Therapy

## 2020-06-20 ENCOUNTER — Other Ambulatory Visit: Payer: Self-pay

## 2020-06-20 DIAGNOSIS — R2681 Unsteadiness on feet: Secondary | ICD-10-CM | POA: Diagnosis not present

## 2020-06-20 DIAGNOSIS — R2689 Other abnormalities of gait and mobility: Secondary | ICD-10-CM | POA: Diagnosis not present

## 2020-06-20 DIAGNOSIS — M6281 Muscle weakness (generalized): Secondary | ICD-10-CM

## 2020-06-20 NOTE — Therapy (Signed)
[facility] [address] [place], [name], [address] Phone: [phone]   Fax:  [phone]  Physical Therapy Treatment  Patient Details  Name: Jonathan [name] MRN: [id number] Date of Birth: Oct 27, 1932 Referring Provider (PT): Billey Gosling, MD   Encounter Date: 06/20/2020   PT End of Session - 06/20/20 1154    Visit Number 15    Number of Visits 18    Date for PT Re-Evaluation 07/27/20   POC for 4 weeks, Cert for 60 days   Authorization Type Medicare (10th Visit PN)    Progress Note Due on Visit 20    PT Start Time 0933    PT Stop Time 1013    PT Time Calculation (min) 40 min    Equipment Utilized During Treatment Gait belt    Activity Tolerance Patient tolerated treatment well    Behavior During Therapy [facility] for tasks assessed/performed           Past Medical History:  Diagnosis Date  . Atrial fibrillation (HCC)    a. paroxysmal, on Eliquis for anticoagulation  . BPH (benign prostatic hyperplasia)   . Bronchitis   . Cataract   . COPD (chronic obstructive pulmonary disease) (HCC)    2 liters O2 HS  . Depression   . Fatty tumor    waste and back  . Fibromyalgia   . GERD (gastroesophageal reflux disease)   . Glaucoma    bilateral eyes  . Gout   . Hereditary and idiopathic peripheral neuropathy 08/28/2015  . Hypertension   . Hypothyroidism   . Hypoxia   . Insomnia   . Osteoarthritis   . Osteoporosis   . Peripheral neuropathy   . Sleep apnea    uses cpap-add oxygen at night  . Spinal compression fracture (HCC) seventh vertebre  . Transient alteration of awareness 08/28/2015  . Type 2 diabetes mellitus ([place]) 03/08/2016    Past Surgical History:  Procedure Laterality Date  . APPENDECTOMY  age 41  . CARPAL TUNNEL RELEASE Right    early 2000s  . CATARACT EXTRACTION     bilateral  . CHOLECYSTECTOMY  age 75  . EYE SURGERY    . FOOT ARTHRODESIS Right 02/02/2013   Procedure: RIGHT HALLUX METATARSAL  PHALANGEAL JOINT ARTHRODESIS ;  Surgeon: Wylene Simmer, MD;  Location: [facility];  Service: Orthopedics;  Laterality: Right;  . INGUINAL HERNIA REPAIR  age 4   rt side  . NASAL CONCHA BULLOSA RESECTION  age 27  . PROSTATE SURGERY    . SHOULDER ARTHROSCOPY W/ ROTATOR CUFF REPAIR Right    early 2000s  . tonsil    . VASECTOMY  age 9    There were no vitals filed for this visit.   Subjective Assessment - 06/20/20 0933    Subjective Feeling better today. Having less panic attacks. Slept better last night. No falls.    Pertinent History pAF on Eliquis, chronic pain on oxycodone, previous SDH, COPD on home O2 noct, HTN, hypothyroidism, OSA on CPAP, and DM, Neuropathy    Limitations Standing;Walking    Patient Stated Goals Improve the Balance; Improve Core Strength    Currently in Pain? Yes    Pain Score 4     Pain Location Back    Pain Orientation Lower    Pain Descriptors / Indicators Aching    Pain Type Chronic pain    Pain Onset Yesterday    Pain Onset Yesterday  Balance Exercises - 06/20/20 0001      Balance Exercises: Standing   Standing Eyes Closed Wide (BOA);Foam/compliant surface;3 reps;20 secs    Standing Eyes Closed Limitations feet hip width distance on air ex, intermittent taps to walls/chair for balance    SLS Limitations alternating toe taps to bottom step (6") standing on air ex, x10 reps B   SLS with Vectors Limitations standing on 6" step, for modified SLS 2 x 5 reps B head turns, 2 x 5 reps B head nods, min guard/ min A for balance    Stepping Strategy Anterior;Posterior;Foam/compliant surface;10 reps    Stepping Strategy Limitations on blue foam beam, UE support and progressing to none with min guard/min A   Step Over Hurdles / Cones 4 smaller hurdles next to countertop standing on blue compliant mat, stepping over in reciprocal pattern down and back x4 reps, cues for slowed and controlled, using  countertop intermittent for balance    Other Standing Exercises multi directional stepping: following direction sheet - beginning with stepping forward and laterally over 1-2" obstacles for incr foot clearance, then performing on blue compliant mat, progressed to therapist calling out directions             PT Education - 06/20/20 1153    Education Details making sure pt is monitoring his blood sugar and drinking water (pt reports he has primarily been drinking soft drinks), will begin checking goals next week and plan to wrap up with PT    Person(s) Educated Patient    Methods Explanation    Comprehension Verbalized understanding            PT Short Term Goals - 05/28/20 1350      PT SHORT TERM GOAL #1   Title = LTGs             PT Long Term Goals - 05/28/20 1350      PT LONG TERM GOAL #1   Title Patient will be independent with final balance/strengthening HEP (All LTGs Due: 06/28/20)    Baseline continue to progress HEP, completing 2-3 days/week    Time 4    Period Weeks    Status Revised    Target Date 06/28/20      PT LONG TERM GOAL #2   Title Patient will improve FGA to >/= 24/30 to demonstrate reduced fall risk.    Baseline 19/30, 22/30 on 05/23/20    Time 4    Period Weeks    Status Revised      PT LONG TERM GOAL #3   Title Patient will demo ability to complete situation 4 of M-CTSIB for >/= 20 seconds    Baseline 6 seconds on situation 4    Time 4    Period Weeks    Status Revised      PT LONG TERM GOAL #4   Title Patient will improve TUG to </= 10 secs to demonstrate improved functional mobility and reduced fall risk    Baseline 12.16, 11.69 seconds    Time 4    Period Weeks    Status Revised      PT LONG TERM GOAL #5   Title --    Baseline --    Time --    Period --    Status --                 Plan - 06/20/20 1155    Clinical Impression Statement Pt reporting feeling better today during session compared  to earlier this week. Pt  attentive to exercises today and no incr anxiety. Todays sesion continued to focus on balance strategies on compliant surfaces with focus on SLS, obstacle negotiation, multi-directional stepping, and vision removed. Discussed with pt will check LTGs next session and anticipate D/C, pt in agreement.    Personal Factors and Comorbidities Comorbidity 3+    Comorbidities pAF on Eliquis, chronic pain on oxycodone and Ambien, previous SDH, COPD on home O2 noct, HTN, hypothyroidism, OSA on CPAP, and DM    Examination-Activity Limitations Locomotion Level;Stairs    Examination-Participation Restrictions Community Activity;Yard Work    Conservation officer, historic buildings Evolving/Moderate complexity    Rehab Potential Good    PT Frequency 2x / week    PT Duration 4 weeks    PT Treatment/Interventions ADLs/Self Care Home Management;Electrical Stimulation;DME Instruction;Stair training;Functional mobility training;Gait training;Therapeutic activities;Therapeutic exercise;Balance training;Neuromuscular re-education;Patient/family education;Vestibular    PT Next Visit Plan begin checking LTGs, review HEP, anticipate D/C.    Consulted and Agree with Plan of Care Patient           Patient will benefit from skilled therapeutic intervention in order to improve the following deficits and impairments:  Decreased balance,Decreased strength,Pain,Impaired sensation,Difficulty walking,Decreased endurance,Abnormal gait  Visit Diagnosis: Unsteadiness on feet  Muscle weakness (generalized)  Other abnormalities of gait and mobility     Problem List Patient Active Problem List   Diagnosis Date Noted  . Acute sinusitis 06/12/2020  . Anxiety 06/12/2020  . Recurrent falls 03/25/2020  . SDH (subdural hematoma) (HCC) 03/21/2020  . Intracranial hemorrhage (HCC) 03/14/2020  . Fall at home, subsequent encounter 03/14/2020  . Leukocytosis 03/14/2020  . Nausea 09/13/2019  . Rotator cuff arthropathy of left shoulder  07/21/2019  . Dupuytren's disease of palm 07/12/2019  . Sebaceous cyst 06/27/2019  . COVID-19 05/08/2019  . Acute encephalopathy 05/08/2019  . Obstructive sleep apnea   . Asymmetrical left sensorineural hearing loss 07/12/2018  . Chronic anticoagulation 12/21/2017  . Compression fracture of thoracic vertebra (HCC) 09/21/2017  . Degeneration of thoracic intervertebral disc 09/21/2017  . Thoracic back pain 08/10/2017  . Degenerative joint disease of hand 07/14/2017  . Osteoarthritis   . Hypoxia   . Fibromyalgia   . Fatty tumor   . Cataract   . BPH (benign prostatic hyperplasia)   . Chronic atrial fibrillation (HCC)   . Insomnia 03/10/2017  . Chronic pain, legs and back 03/10/2017  . Hyperlipidemia 03/10/2017  . Obesity (BMI 30.0-34.9) 12/08/2016  . Depression 09/07/2016  . Spondylolisthesis 09/07/2016  . Hypothyroidism 03/08/2016  . GERD (gastroesophageal reflux disease) 03/08/2016  . Gout 03/08/2016  . Glaucoma 03/08/2016  . Type 2 diabetes mellitus (HCC) 03/08/2016  . Hereditary and idiopathic peripheral neuropathy 08/28/2015  . Essential hypertension 12/04/2011  . Allergic rhinitis, seasonal 12/04/2011  . COPD (chronic obstructive pulmonary disease) (HCC) 07/18/2011    Drake Leach, PT, DPT  06/20/2020, 11:57 AM  [facility] [address] [place], [place], [address] Phone: [phone]   Fax:  [phone]  Name: Jonathan [name] MRN: [id number] Date of Birth: 1932/08/23

## 2020-06-25 ENCOUNTER — Other Ambulatory Visit: Payer: Self-pay

## 2020-06-25 ENCOUNTER — Ambulatory Visit: Payer: Medicare Other | Admitting: Physical Therapy

## 2020-06-25 ENCOUNTER — Encounter: Payer: Self-pay | Admitting: Physical Therapy

## 2020-06-25 DIAGNOSIS — M6281 Muscle weakness (generalized): Secondary | ICD-10-CM

## 2020-06-25 DIAGNOSIS — R2689 Other abnormalities of gait and mobility: Secondary | ICD-10-CM | POA: Diagnosis not present

## 2020-06-25 DIAGNOSIS — R2681 Unsteadiness on feet: Secondary | ICD-10-CM

## 2020-06-25 NOTE — Patient Instructions (Signed)
Access Code: [email or web address] URL: [email or web address] Date: 06/25/2020 Prepared by: Janann August  Exercises Standing Balance with Eyes Closed on Foam - 1 x daily - 5 x weekly - 1 sets - 3 reps - 30 hold Romberg Stance on Foam Pad with Head Rotation - 1 x daily - 5 x weekly - 2 sets - 10 reps Romberg Stance with Head Nods on Foam Pad - 1 x daily - 5 x weekly - 2 sets - 10 reps Standing Romberg to 3/4 Tandem Stance - 1 x daily - 5 x weekly - 1 sets - 3 reps - 30 hold Standing Single Leg Stance with Counter Support - 1 x daily - 5 x weekly - 1 sets - 3 reps - 15 hold Standing Hip Abduction with Counter Support - 1 x daily - 5 x weekly - 2 sets - 10 reps Standing Marching - 1 x daily - 5 x weekly - 3 sets - 3 reps  Patient Education Falls at Home Checklist

## 2020-06-25 NOTE — Therapy (Signed)
[facility] [address] [place], [name], [address] Phone: [phone]   Fax:  [phone]  Physical Therapy Treatment  Patient Details  Name: Jonathan [name] MRN: [id number] Date of Birth: 1933/03/26 Referring Provider (PT): Billey Gosling, MD   Encounter Date: 06/25/2020   PT End of Session - 06/25/20 1207    Visit Number 16    Number of Visits 18    Date for PT Re-Evaluation 07/27/20   POC for 4 weeks, Cert for 60 days   Authorization Type Medicare (10th Visit PN)    Progress Note Due on Visit 20    PT Start Time 0934    PT Stop Time 1015    PT Time Calculation (min) 41 min    Equipment Utilized During Treatment Gait belt    Activity Tolerance Patient tolerated treatment well    Behavior During Therapy [facility] for tasks assessed/performed           Past Medical History:  Diagnosis Date  . Atrial fibrillation (HCC)    a. paroxysmal, on Eliquis for anticoagulation  . BPH (benign prostatic hyperplasia)   . Bronchitis   . Cataract   . COPD (chronic obstructive pulmonary disease) (HCC)    2 liters O2 HS  . Depression   . Fatty tumor    waste and back  . Fibromyalgia   . GERD (gastroesophageal reflux disease)   . Glaucoma    bilateral eyes  . Gout   . Hereditary and idiopathic peripheral neuropathy 08/28/2015  . Hypertension   . Hypothyroidism   . Hypoxia   . Insomnia   . Osteoarthritis   . Osteoporosis   . Peripheral neuropathy   . Sleep apnea    uses cpap-add oxygen at night  . Spinal compression fracture (HCC) seventh vertebre  . Transient alteration of awareness 08/28/2015  . Type 2 diabetes mellitus ([place]) 03/08/2016    Past Surgical History:  Procedure Laterality Date  . APPENDECTOMY  age 24  . CARPAL TUNNEL RELEASE Right    early 2000s  . CATARACT EXTRACTION     bilateral  . CHOLECYSTECTOMY  age 9  . EYE SURGERY    . FOOT ARTHRODESIS Right 02/02/2013   Procedure: RIGHT HALLUX METATARSAL  PHALANGEAL JOINT ARTHRODESIS ;  Surgeon: Wylene Simmer, MD;  Location: [facility];  Service: Orthopedics;  Laterality: Right;  . INGUINAL HERNIA REPAIR  age 55   rt side  . NASAL CONCHA BULLOSA RESECTION  age 51  . PROSTATE SURGERY    . SHOULDER ARTHROSCOPY W/ ROTATOR CUFF REPAIR Right    early 2000s  . tonsil    . VASECTOMY  age 62    There were no vitals filed for this visit.   Subjective Assessment - 06/25/20 0939    Subjective Asking about a missing phone that he lost last week. Has been sleeping much better at night.    Pertinent History pAF on Eliquis, chronic pain on oxycodone, previous SDH, COPD on home O2 noct, HTN, hypothyroidism, OSA on CPAP, and DM, Neuropathy    Limitations Standing;Walking    Patient Stated Goals Improve the Balance; Improve Core Strength    Currently in Pain? Yes    Pain Score 2     Pain Location Back    Pain Orientation Lower    Pain Descriptors / Indicators Aching    Pain Type Chronic pain    Pain Onset Yesterday    Aggravating Factors  "no idea"  Pain Relieving Factors "not sure"    Pain Onset Efrain Sella PT Assessment - 06/25/20 0941      Functional Gait  Assessment   Gait assessed  Yes    Gait Level Surface Walks 20 ft in less than 7 sec but greater than 5.5 sec, uses assistive device, slower speed, mild gait deviations, or deviates 6-10 in outside of the 12 in walkway width.    Change in Gait Speed Able to smoothly change walking speed without loss of balance or gait deviation. Deviate no more than 6 in outside of the 12 in walkway width.    Gait with Horizontal Head Turns Performs head turns smoothly with no change in gait. Deviates no more than 6 in outside 12 in walkway width    Gait with Vertical Head Turns Performs head turns with no change in gait. Deviates no more than 6 in outside 12 in walkway width.    Gait and Pivot Turn Pivot turns safely within 3 sec and stops quickly with no loss of balance.     Step Over Obstacle Is able to step over 2 stacked shoe boxes taped together (9 in total height) without changing gait speed. No evidence of imbalance.    Gait with Narrow Base of Support Ambulates less than 4 steps heel to toe or cannot perform without assistance.    Gait with Eyes Closed Walks 20 ft, no assistive devices, good speed, no evidence of imbalance, normal gait pattern, deviates no more than 6 in outside 12 in walkway width. Ambulates 20 ft in less than 7 sec.   6.81 seconds   Ambulating Backwards Walks 20 ft, no assistive devices, good speed, no evidence for imbalance, normal gait    Steps Alternating feet, no rail.    Total Score 26    FGA comment: 26/30 = low fall risk                  Access Code: [email or web address] URL: [email or web address] Date: 06/25/2020 Prepared by: Janann August   Reviewed pt's final HEP. See MedBridge for further details. Pt reports has not been able to perform consistently due to being primary caregiver for his wife. Discussed ways of incorporating exercises into daily life such as performing counter exercises when pt is already cooking in the kitchen in order to incr compliance and maintain gains from therapy, pt verbalized understanding.  Exercises Standing Balance with Eyes Closed on Foam - 1 x daily - 5 x weekly - 1 sets - 3 reps - 30 hold Romberg Stance on Foam Pad with Head Rotation - 1 x daily - 5 x weekly - 2 sets - 10 reps Romberg Stance with Head Nods on Foam Pad - 1 x daily - 5 x weekly - 2 sets - 10 reps Standing Romberg to 3/4 Tandem Stance - 1 x daily - 5 x weekly - 1 sets - 3 reps - 30 hold Standing Single Leg Stance with Counter Support - 1 x daily - 5 x weekly - 1 sets - 3 reps - 15 hold Standing Hip Abduction with Counter Support - 1 x daily - 5 x weekly - 2 sets - 10 reps Standing Marching - 1 x daily - 5 x weekly - 3 sets - 3 reps  Patient Education [facility] [place] [facility]  Treatment/Exercise - 06/25/20 0001  Ambulation/Gait   Stairs Yes    Stairs Assistance 5: Supervision    Stair Management Technique No rails;Alternating pattern;Forwards    Number of Stairs 4    Height of Stairs 6               Balance Exercises - 06/25/20 0001      Balance Exercises: Standing   Standing Eyes Closed Wide (BOA);Foam/compliant surface;3 reps;20 secs    Standing Eyes Closed Limitations on 2 pillows, eyes closed 2 x 5 reps head turns, 2 x 5 reps head nods - intermittent taps to wall for balance             PT Education - 06/25/20 1207    Education Details progress towards goals, D/C at next session, reviewed HEP    Person(s) Educated Patient    Methods Explanation;Demonstration;Handout    Comprehension Verbalized understanding;Returned demonstration            PT Short Term Goals - 05/28/20 1350      PT SHORT TERM GOAL #1   Title = LTGs             PT Long Term Goals - 06/25/20 0954      PT LONG TERM GOAL #1   Title Patient will be independent with final balance/strengthening HEP (All LTGs Due: 06/28/20)    Baseline reviewed on 06/25/20, pt reports not performing consistently.    Time 4    Period Weeks    Status Partially Met      PT LONG TERM GOAL #2   Title Patient will improve FGA to >/= 24/30 to demonstrate reduced fall risk.    Baseline 19/30, 22/30 on 05/23/20, 26/30 on 06/25/20    Time 4    Period Weeks    Status Achieved      PT LONG TERM GOAL #3   Title Patient will demo ability to complete situation 4 of M-CTSIB for >/= 20 seconds    Baseline 6 seconds on situation 4    Time 4    Period Weeks    Status Revised      PT LONG TERM GOAL #4   Title Patient will improve TUG to </= 10 secs to demonstrate improved functional mobility and reduced fall risk    Baseline 12.16, 11.69 seconds    Time 4    Period Weeks    Status Revised                 Plan - 06/25/20 1208    Clinical Impression Statement Began to check  LTGs for anticipated D/C later this week. Pt met LTG #2 in regards to FGA, improved score to a 26/30, indicating pt is now at an incr risk for falls. Pt partially met LTG #1 - reviewed final HEP today with pt reporting that he has not been performing consistently at home due to being the primary caregiver for his wife. Will assess remainder of LTGs at next session with pt in agreement for D/C at that time due to progress.    Personal Factors and Comorbidities Comorbidity 3+    Comorbidities pAF on Eliquis, chronic pain on oxycodone and Ambien, previous SDH, COPD on home O2 noct, HTN, hypothyroidism, OSA on CPAP, and DM    Examination-Activity Limitations Locomotion Level;Stairs    Examination-Participation Restrictions Community Activity;Yard Work    Stability/Clinical Decision Making Evolving/Moderate complexity    Rehab Potential Good    PT Frequency 2x / week    PT Duration   4 weeks    PT Treatment/Interventions ADLs/Self Care Home Management;Electrical Stimulation;DME Instruction;Stair training;Functional mobility training;Gait training;Therapeutic activities;Therapeutic exercise;Balance training;Neuromuscular re-education;Patient/family education;Vestibular    PT Next Visit Plan check remainder of LTGs, D/C from PT    Consulted and Agree with Plan of Care Patient           Patient will benefit from skilled therapeutic intervention in order to improve the following deficits and impairments:  Decreased balance,Decreased strength,Pain,Impaired sensation,Difficulty walking,Decreased endurance,Abnormal gait  Visit Diagnosis: Unsteadiness on feet  Muscle weakness (generalized)  Other abnormalities of gait and mobility     Problem List Patient Active Problem List   Diagnosis Date Noted  . Acute sinusitis 06/12/2020  . Anxiety 06/12/2020  . Recurrent falls 03/25/2020  . SDH (subdural hematoma) (HCC) 03/21/2020  . Intracranial hemorrhage (HCC) 03/14/2020  . Fall at home, subsequent  encounter 03/14/2020  . Leukocytosis 03/14/2020  . Nausea 09/13/2019  . Rotator cuff arthropathy of left shoulder 07/21/2019  . Dupuytren's disease of palm 07/12/2019  . Sebaceous cyst 06/27/2019  . COVID-19 05/08/2019  . Acute encephalopathy 05/08/2019  . Obstructive sleep apnea   . Asymmetrical left sensorineural hearing loss 07/12/2018  . Chronic anticoagulation 12/21/2017  . Compression fracture of thoracic vertebra (HCC) 09/21/2017  . Degeneration of thoracic intervertebral disc 09/21/2017  . Thoracic back pain 08/10/2017  . Degenerative joint disease of hand 07/14/2017  . Osteoarthritis   . Hypoxia   . Fibromyalgia   . Fatty tumor   . Cataract   . BPH (benign prostatic hyperplasia)   . Chronic atrial fibrillation (HCC)   . Insomnia 03/10/2017  . Chronic pain, legs and back 03/10/2017  . Hyperlipidemia 03/10/2017  . Obesity (BMI 30.0-34.9) 12/08/2016  . Depression 09/07/2016  . Spondylolisthesis 09/07/2016  . Hypothyroidism 03/08/2016  . GERD (gastroesophageal reflux disease) 03/08/2016  . Gout 03/08/2016  . Glaucoma 03/08/2016  . Type 2 diabetes mellitus (HCC) 03/08/2016  . Hereditary and idiopathic peripheral neuropathy 08/28/2015  . Essential hypertension 12/04/2011  . Allergic rhinitis, seasonal 12/04/2011  . COPD (chronic obstructive pulmonary disease) (HCC) 07/18/2011     N , PT, DPT  06/25/2020, 12:11 PM  [facility] [address] , [place], [address] Phone: [phone]   Fax:  [phone]  Name: Anson Hackmann MRN: [id number] Date of Birth: 08/25/1932   

## 2020-06-28 ENCOUNTER — Ambulatory Visit: Payer: Medicare Other | Admitting: Physical Therapy

## 2020-07-01 ENCOUNTER — Ambulatory Visit: Payer: Medicare Other | Admitting: Pharmacist

## 2020-07-01 ENCOUNTER — Encounter: Payer: Self-pay | Admitting: *Deleted

## 2020-07-01 ENCOUNTER — Other Ambulatory Visit: Payer: Self-pay

## 2020-07-01 DIAGNOSIS — E782 Mixed hyperlipidemia: Secondary | ICD-10-CM

## 2020-07-01 DIAGNOSIS — F419 Anxiety disorder, unspecified: Secondary | ICD-10-CM

## 2020-07-01 DIAGNOSIS — M109 Gout, unspecified: Secondary | ICD-10-CM

## 2020-07-01 DIAGNOSIS — N401 Enlarged prostate with lower urinary tract symptoms: Secondary | ICD-10-CM

## 2020-07-01 DIAGNOSIS — K219 Gastro-esophageal reflux disease without esophagitis: Secondary | ICD-10-CM

## 2020-07-01 DIAGNOSIS — E1165 Type 2 diabetes mellitus with hyperglycemia: Secondary | ICD-10-CM

## 2020-07-01 DIAGNOSIS — R351 Nocturia: Secondary | ICD-10-CM

## 2020-07-01 DIAGNOSIS — I10 Essential (primary) hypertension: Secondary | ICD-10-CM

## 2020-07-01 NOTE — Chronic Care Management (AMB) (Signed)
Chronic Care Management Pharmacy  Name: Jonathan [name]  MRN: [id number] DOB: April 21, 1933   Chief Complaint/ HPI  Jonathan [name],  87 y.o. , male presents for their Follow-Up CCM visit with the clinical pharmacist via telephone.  PCP : Binnie Rail, MD Patient Care Team: Binnie Rail, MD as PCP - General (Internal Medicine) Stanford Breed Denice Bors, MD as PCP - Cardiology (Cardiology) Council Mechanic, MD as Referring Physician (Family Medicine) Earnie Larsson, MD as Consulting Physician (Neurosurgery) Stanford Breed Denice Bors, MD as Consulting Physician (Cardiology) Michael Boston, MD as Consulting Physician (General Surgery) Karen Kays, NP as Nurse Practitioner (Nurse Practitioner) [name], [facility] as Pharmacist (Pharmacist)  Their chronic conditions include: Hypertension, Hyperlipidemia, Diabetes, Atrial Fibrillation, GERD, COPD, Hypothyroidism, Depression, Osteoarthritis, BPH, Gout and Allergic Rhinitis, Glaucoma   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  Office Visits: 06/18/19 Dr Quay Burow OV: c/o poor sleep, not currently taking medication. Retry melatonin. Probable gout flare, restart allopurinol 300 mg. Taper off duloxetine and start fluoxetine 20 mg daily. Consider therapist.  06/12/20 Dr Alain Marion OV: pt c/o insomnia, panic attacks, anxiety, sinusitis. Reduce Cymbalta to 1 daily. Discontinue Buspar. Rx low-dose lorazepam at night, stop alcohol. Rx'd ceftin.  05/24/20 pt message: panic attacks, rx'd buspar TID 04/18/20 Dr Quay Burow OV: chronic f/u. Stay off allopurinol. Restart Eliquis and f/u with cardiology.  03/25/20 Dr Quay Burow OV: hospital f/u. Recurrent falls - related to Ambien, will avoid sleep meds, taking melatonin only. Referred,  to PT for balance. Denies GERD, will taper off omeprazole. D/C allopurinol for now, check uric acid next visit. Increase metformin to BID.  09/13/19 Dr Quay Burow OV: acute visit for nausea, may be related to constipation, rec'd Miralax daily.  08/14/19 Dr  Quay Burow OV: chronic f/u. Alternating Ambien and Sonata (1 rx at a time). Advised to increase metformin to BID.  Consult Visit: 03/21/20 PA Kerin Ransom (cardiology):  03/14/20 - 03/15/20 hospital admission: intracranial hemorrhage s/p multiple falls. [name]. Hold Eliquis (hx of TIA on previous holiday) and counseled on fall prevention (avoidance of Ambien, oxycodone, alcohol). Stopped Ambien and Sonata, rx'd trazodone.  01/30/20 Dr Stanford Breed (cardiology): Afib in sinus rhythm. No med changes.  11/28/19 Dr Tonita Cong (ortho surgery): f/u for thoracic spine pain. Pt going to PT.  09/12/19 Dr Dwaine Gale (psychiatry): f/u for MDD. Continue same meds - duloxetine and bupropion. Increased bupropion to BID 2 weeks prior.  Allergies  Allergen Reactions  . Other Other (See Comments)    DUST-Other reaction(s): Respiratory distress    Medications: Outpatient Encounter Medications as of 07/01/2020  Medication Sig  . acetaminophen (TYLENOL) 650 MG CR tablet Take 650 mg by mouth in the morning and at bedtime. Pt takes 2 tabs 2 times daily  . allopurinol (ZYLOPRIM) 300 MG tablet Take 1 tablet (300 mg total) by mouth every evening. For gout  . apixaban (ELIQUIS) 5 MG TABS tablet Take 1 tablet (5 mg total) by mouth 2 (two) times daily.  Marland Kitchen atorvastatin (LIPITOR) 20 MG tablet Take 1 tablet (20 mg total) by mouth daily. For cholesterol  . bimatoprost (LUMIGAN) 0.01 % SOLN Place 1 drop into both eyes at bedtime.   . Biotin 5000 MCG TABS Take 5,000 mcg by mouth daily.  . calcium-vitamin D (OSCAL WITH D) 500-200 MG-UNIT tablet Take 1 tablet by mouth daily with breakfast.  . colchicine 0.6 MG tablet Take 0.6 mg by mouth daily as needed (flareups).   . diltiazem (CARDIZEM CD) 300 MG 24 hr capsule Take 1 capsule (300 mg  total) by mouth daily.  Marland Kitchen diltiazem (CARDIZEM) 30 MG tablet Take 1 tablet (30 mg total) by mouth 2 (two) times daily as needed (sustained elevated heart rates >100 bpm for > 20 minutes.).  Marland Kitchen doxazosin  (CARDURA) 8 MG tablet Take 1 tablet (8 mg total) by mouth daily.  Marland Kitchen FLUoxetine (PROZAC) 20 MG tablet Take 1 tablet (20 mg total) by mouth daily. Start after you are off the cymbalta (Patient taking differently: Take 30 mg by mouth daily. Start after you are off the cymbalta)  . glucose blood test strip Use to check blood sugar daily. E11.9  . hydrochlorothiazide (HYDRODIURIL) 25 MG tablet Take 1 tablet (25 mg total) by mouth daily. For blood pressure  . Lancets MISC Use to check blood sugars daily. E11.9  . levothyroxine (SYNTHROID) 112 MCG tablet Take 1 tablet (112 mcg total) by mouth daily before breakfast. For thyroid  . magnesium oxide (MAG-OX) 400 MG tablet Take 400 mg by mouth daily.  . Melatonin 5 MG TABS Take 5 mg by mouth at bedtime.  . metFORMIN (GLUCOPHAGE-XR) 750 MG 24 hr tablet Take 1 tablet (750 mg total) by mouth in the morning and at bedtime. For diabetes  . mometasone (NASONEX) 50 MCG/ACT nasal spray Place 2 sprays into the nose daily. (Patient taking differently: Place 2 sprays into the nose as needed (allergies).)  . Multiple Vitamin (MULTIVITAMIN) tablet Take 1 tablet by mouth daily.  . nitroGLYCERIN (NITROSTAT) 0.4 MG SL tablet Place 1 tablet (0.4 mg total) under the tongue every 5 (five) minutes as needed for chest pain.  Marland Kitchen omeprazole (PRILOSEC) 20 MG capsule TAKE 1 CAPSULE BY MOUTH EVERY DAY.  For heartburn  . potassium chloride SA (KLOR-CON M20) 20 MEQ tablet Take 1 tablet (20 mEq total) by mouth daily. For low potassium  . RESTASIS 0.05 % ophthalmic emulsion Place 1 drop into both eyes 2 (two) times daily.   . sildenafil (REVATIO) 20 MG tablet Take 40-100 mg by mouth daily as needed.  . solifenacin (VESICARE) 10 MG tablet Take 10 mg by mouth daily.  . vitamin C (ASCORBIC ACID) 500 MG tablet Take 500 mg by mouth 2 (two) times daily.   Marland Kitchen VITAMIN D, CHOLECALCIFEROL, PO Take 25 mcg by mouth daily.   . [DISCONTINUED] DULoxetine (CYMBALTA) 30 MG capsule Take 1 capsule (30 mg  total) by mouth daily for 7 days. Take for one week and then stop  . [DISCONTINUED] LORazepam (ATIVAN) 0.5 MG tablet Take 1 tablet (0.5 mg total) by mouth 2 (two) times daily as needed for anxiety. (Patient not taking: Reported on 07/01/2020)   No facility-administered encounter medications on file as of 07/01/2020.    Wt Readings from Last 3 Encounters:  06/17/20 198 lb 3.2 oz (89.9 kg)  06/12/20 196 lb (88.9 kg)  04/26/20 204 lb (92.5 kg)   Lab Results  Component Value Date   CREATININE 1.16 06/12/2020   BUN 22 06/12/2020   GFR 57.63 (L) 06/12/2020   GFRNONAA >60 03/14/2020   GFRAA >60 03/14/2020   NA 138 06/12/2020   K 3.6 06/12/2020   CALCIUM 9.6 06/12/2020   CO2 27 06/12/2020    Current Diagnosis/Assessment:    Goals Addressed            This Visit's Progress   . Pharmacy Care Plan       CARE PLAN ENTRY (see longitudinal plan of care for additional care plan information)  Current Barriers:  . Chronic Disease Management support, education, and  care coordination needs related to Hypertension, Hyperlipidemia, Diabetes, GERD, Anxiety, Overactive Bladder, and Gout   Hypertension BP Readings from Last 3 Encounters:  06/18/20 (!) 142/78  06/17/20 (!) 142/68  06/12/20 140/68 .  Pharmacist Clinical Goal(s): o Over the next 90 days, patient will work with PharmD and providers to maintain BP goal <140/90 . Current regimen:  o HCTZ 25 mg daily o Doxazosin 8 mg daily o Diltiazem 300 mg daily . Interventions: o Discussed BP goals and benefits of medications for prevention of heart attack / stroke . Patient self care activities - Over the next 90 days, patient will: o Check BP daily, document, and provide at future appointments o Ensure daily salt intake < 2300 mg/day  Hyperlipidemia Lab Results  Component Value Date/Time   LDLCALC 66 11/11/2018 11:14 AM   LDLDIRECT 51.0 08/14/2019 10:28 AM .  Pharmacist Clinical Goal(s): o Over the next 90 days, patient will work  with PharmD and providers to maintain LDL goal < 100 . Current regimen:  o Atorvastatin 20 mg daily o Nitroglycerin 0.4 mg SL prn . Interventions: o Discussed cholesterol goals and benefits of medications for prevention of heart attack / stroke . Patient self care activities - Over the next 90 days, patient will: o Continue current medications  Diabetes Lab Results  Component Value Date/Time   HGBA1C 7.5 (H) 06/12/2020 11:31 AM   HGBA1C 8.1 (H) 03/14/2020 09:45 PM .  Pharmacist Clinical Goal(s): o Over the next 90 days, patient will work with PharmD and providers to achieve A1c goal <8% . Current regimen:  o Metformin ER 750 mg twice a day o Testing supplies . Interventions: o Discussed blood sugar goals and benefits of medications for prevention of diabetic complications . Patient self care activities - Over the next 90 days, patient will: o Check blood sugar before breakfast, document, and provide at future appointments o Contact provider with any episodes of hypoglycemia  GERD . Pharmacist Clinical Goal(s) o Over the next 90 days, patient will work with PharmD and providers to optimize therapy . Current regimen:  o Omeprazole 20 mg daily . Interventions: o Patient did trial off PPI, symptoms worsened so he resumed daily dosing . Patient self care activities - Over the next 90 days, patient will: o Continue current therapy  Gout . Pharmacist Clinical Goal(s) o Over the next 90 days, patient will work with PharmD and providers to optimize therapy . Current regimen:  o Allopurinol 300 mg daily o Colchicine 0.6 mg as needed for flares . Interventions: o Benefits of allopurinol for prevention of gout flares; pt did trial off allopurinol and had a gout flare, so medication was resumed o Discussed low purine diet - handout provided . Patient self care activities - Over the next 90 days, patient will: o Continue current therapy  Overactive bladder . Pharmacist Clinical  Goal(s) o Over the next 90 days, patient will work with PharmD and providers to optimize therapy . Current regimen:  o Solifenancin 10 mg daily . Interventions: o Discussed benefits of solifenacin; pt had confused it with sildenafil so was not taking; advised to take at bedtime to help with nocturia . Patient self care activities - Over the next 90 days, patient will: o Continue current therapy  Anxiety / Insomnia . Pharmacist Clinical Goal(s) o Over the next 30 days, patient will work with PharmD and providers to optimize therapy . Current regimen:  o Fluoxetine 20 mg daily o Melatonin 5 mg at bedtime . Interventions:  o Pt started fluoxetine about 2 weeks ago and notes some improvement, wishes to try higher dose. Recommended pt to increase to 30 mg (1.5 tablets) daily o Discussed benefits of therapy - pt has not contacted therapist yet . Patient self care activities - Over the next 30 days, patient will: o Increase fluoxetine to 30 mg (1.5 tablet) daily o Contact psychiatrist to begin therapy  Medication management . Pharmacist Clinical Goal(s): o Over the next 90 days, patient will work with PharmD and providers to maintain optimal medication adherence . [facility] . Interventions o Comprehensive medication review performed. o Continue current medication management strategy o Advised to consult with Medicare expert to choose an optimal insurance plan for 2022 . Patient self care activities - Over the next 90 days, patient will: o Focus on medication adherence by fill date o Take medications as prescribed o Report any questions or concerns to PharmD and/or provider(s) o Contact [facility] [phone]) to set up a meeting with a Medicare advisor  Please see past updates related to this goal by clicking on the "Past Updates" button in the selected goal        AFIB   Patient is currently rate controlled. Office heart rates are  Pulse Readings from Last 3 Encounters:   06/17/20 (!) 58  06/12/20 (!) 57  06/11/20 84   CHA2DS2-VASc Score = 4  The patient's score is based upon: CHF History: No HTN History: Yes Diabetes History: Yes Stroke History: No Vascular Disease History: No  Patient has failed these meds in past: n/a Patient is currently controlled on the following medications:  . Diltiazem 300 mg daily . Eliquis 5 mg BID  We discussed:  Pt has intracranial hemorrhage in sep 2021 and Eliquis was held for a few weeks, restarted due to Afib burden and history of TIA while off Eliquis in 2019. Pt has regular follow up with cardiology; discussed importance of fall prevention  Plan  Continue current medications   Hypertension   BP goal is:  <140/90  Office blood pressures are  BP Readings from Last 3 Encounters:  06/18/20 (!) 142/78  06/17/20 (!) 142/68  06/12/20 140/68   Patient checks BP at home daily Patient home BP readings are ranging: 130/85, HR 70s  Patient has failed these meds in the past: n/a Patient is currently controlled on the following medications:  . HCTZ 25 mg daily . Doxazosin 8 mg daily . Diltiazem 300 mg daily  We discussed diet and exercise extensively; benefits of medications for prevention of heart attack, stroke and renal damage. Pt denies side effects  Plan  Continue current medications and control with diet and exercise   Hyperlipidemia   LDL goal < 100  Lipid Panel     Component Value Date/Time   CHOL 111 08/14/2019 1028   TRIG 211.0 (H) 08/14/2019 1028   HDL 37.60 (L) 08/14/2019 1028   LDLCALC 66 11/11/2018 1114   LDLDIRECT 51.0 08/14/2019 1028    Hepatic Function Latest Ref Rng & Units 06/12/2020 03/14/2020 09/18/2019  Total Protein 6.0 - 8.3 g/dL 6.7 6.7 6.7  Albumin 3.5 - 5.2 g/dL 4.6 4.5 4.5  AST 0 - 37 U/L 24 24 13   ALT 0 - 53 U/L 29 21 18   Alk Phosphatase 39 - 117 U/L 102 88 91  Total Bilirubin 0.2 - 1.2 mg/dL 0.5 0.4 0.5     The ASCVD Risk score (Alpine., et al., 2013) failed to  calculate for the following  reasons:   The 2013 ASCVD risk score is only valid for ages 61 to 66   Patient has failed these meds in past: n/a Patient is currently controlled on the following medications:  . Atorvastatin 20 mg daily . Nitroglycerin 0.4 mg SL prn  We discussed:  diet and exercise extensively; Cholesterol goals; benefits of statin for ASCVD risk reduction; pt has never had to use nitroglycerin; he denies side effects  Plan  Continue current medications and control with diet and exercise  Diabetes   A1c goal <8%  Recent Relevant Labs: Lab Results  Component Value Date/Time   HGBA1C 7.5 (H) 06/12/2020 11:31 AM   HGBA1C 8.1 (H) 03/14/2020 09:45 PM   GFR 57.63 (L) 06/12/2020 11:31 AM   GFR 68.75 09/18/2019 09:40 AM   MICROALBUR 28.1 (H) 08/11/2017 08:48 AM   MICROALBUR 21.2 (H) 12/08/2016 11:43 AM    Last diabetic Eye exam:  Lab Results  Component Value Date/Time   HMDIABEYEEXA No Retinopathy 02/01/2019 12:00 AM    Last diabetic Foot exam: No results found for: HMDIABFOOTEX   Checking BG: Weekly  Patient has failed these meds in past: n/a Patient is currently controlled on the following medications: Marland Kitchen Metformin ER 750 mg BID . Testing supplies  We discussed: diet and exercise extensively; A1c and BG goals; A1c improved with higher dose of metformin  Plan  Continue control with diet and exercise   Depression / Insomnia   Depression screen [facility] 2/9 06/12/2020 04/26/2020 08/14/2019  Decreased Interest 1 0 2  Down, Depressed, Hopeless 2 0 2  PHQ - 2 Score 3 0 4  Altered sleeping 3 - 3  Tired, decreased energy 0 - 2  Change in appetite 0 - 2  Feeling bad or failure about yourself  0 - 2  Trouble concentrating 0 - 3  Moving slowly or fidgety/restless 0 - 0  Suicidal thoughts 0 - 0  PHQ-9 Score 6 - 16  Difficult doing work/chores - - -  Some recent data might be hidden    Patient has failed these meds in past: bupropion, sertraline, Belsomra,  trazodone, duloxetine, buspar, ambien, sonata, lorazepam Patient is currently controlled on the following medications:  . Fluoxetine 20 mg daily . Melatonin 5 mg HS  We discussed:  Pt started fluoxetine about 2 weeks ago; Pt reports fluoxetine "takes the edge off" anxiety but he still has panic attacks especially at night; pt reports sleeping moderately well right now but still waking up 1-2 hrs after falling asleep;  -pt wants to try higher dose of fluoxetine, advised to increase by 1/2 tablet rather than taking 2 tablets  Plan  Recommend to increase fluoxetine to 30 mg daily (1.5 tablets)  GERD   Patient has failed these meds in past: n/a Patient is currently controlled on the following medications:  . Omeprazole 20 mg daily  We discussed: Pt did trial off PPI and experienced worsening symptoms, so resumed daily dosing  Plan  Continue current medication  BPH / OAB   No results found for: PSA   Patient has failed these meds in past: n/a Patient is currently uncontrolled on the following medications:  . Solifenacin 10 mg daily  . Doxazosin 8 mg daily  We discussed:  Pt is now taking solifenacin daily, reports mild improvement in symptoms  Plan  Continue current medications  Gout     Ref. Range 02/25/2012 17:32 11/23/2012 09:33  Uric Acid, Serum Latest Ref Range: 4.0 - 7.8 mg/dL 5.0 4.8  Patient has failed these meds in past: n/a Patient is currently controlled on the following medications:  . Allopurinol 300 mg daily . Colchicine 0.6 mg PRN  We discussed:  Pt was off allopurinol for a few months, had likely gout flare in December and allopurinol was restarted.  Plan  Continue current medications  Medication Management   [facility] Uses pill box? No - keeps in bottles and marks AM or PM Pt endorses 90% compliance  We discussed: Discussed benefits of medication synchronization, packaging and delivery as well as enhanced  pharmacist oversight with [employer]. Pt was advised to meet with [facility] counseler to find a better prescription plan, however due to life stressors over previous few months he was unable to do so  Plan  Continue current medication management strategy  Recommend pt meet with [facility] counselor to identify optimal prescription plan    Follow up: 1 month phone visit  Charlene Brooke, PharmD, BCACP Clinical Pharmacist [facility] at [facility] [phone]

## 2020-07-01 NOTE — Patient Instructions (Signed)
Visit Information  Phone number for Pharmacist: [phone]  Goals Addressed            This Visit's Progress   . Pharmacy Care Plan       CARE PLAN ENTRY (see longitudinal plan of care for additional care plan information)  Current Barriers:  . Chronic Disease Management support, education, and care coordination needs related to Hypertension, Hyperlipidemia, Diabetes, GERD, Anxiety, Overactive Bladder, and Gout   Hypertension BP Readings from Last 3 Encounters:  06/18/20 (!) 142/78  06/17/20 (!) 142/68  06/12/20 140/68 .  Pharmacist Clinical Goal(s): o Over the next 90 days, patient will work with PharmD and providers to maintain BP goal <140/90 . Current regimen:  o HCTZ 25 mg daily o Doxazosin 8 mg daily o Diltiazem 300 mg daily . Interventions: o Discussed BP goals and benefits of medications for prevention of heart attack / stroke . Patient self care activities - Over the next 90 days, patient will: o Check BP daily, document, and provide at future appointments o Ensure daily salt intake < 2300 mg/day  Hyperlipidemia Lab Results  Component Value Date/Time   LDLCALC 66 11/11/2018 11:14 AM   LDLDIRECT 51.0 08/14/2019 10:28 AM .  Pharmacist Clinical Goal(s): o Over the next 90 days, patient will work with PharmD and providers to maintain LDL goal < 100 . Current regimen:  o Atorvastatin 20 mg daily o Nitroglycerin 0.4 mg SL prn . Interventions: o Discussed cholesterol goals and benefits of medications for prevention of heart attack / stroke . Patient self care activities - Over the next 90 days, patient will: o Continue current medications  Diabetes Lab Results  Component Value Date/Time   HGBA1C 7.5 (H) 06/12/2020 11:31 AM   HGBA1C 8.1 (H) 03/14/2020 09:45 PM .  Pharmacist Clinical Goal(s): o Over the next 90 days, patient will work with PharmD and providers to achieve A1c goal <8% . Current regimen:  o Metformin ER 750 mg twice a day o Testing  supplies . Interventions: o Discussed blood sugar goals and benefits of medications for prevention of diabetic complications . Patient self care activities - Over the next 90 days, patient will: o Check blood sugar before breakfast, document, and provide at future appointments o Contact provider with any episodes of hypoglycemia  GERD . Pharmacist Clinical Goal(s) o Over the next 90 days, patient will work with PharmD and providers to optimize therapy . Current regimen:  o Omeprazole 20 mg daily . Interventions: o Patient did trial off PPI, symptoms worsened so he resumed daily dosing . Patient self care activities - Over the next 90 days, patient will: o Continue current therapy  Gout . Pharmacist Clinical Goal(s) o Over the next 90 days, patient will work with PharmD and providers to optimize therapy . Current regimen:  o Allopurinol 300 mg daily o Colchicine 0.6 mg as needed for flares . Interventions: o Benefits of allopurinol for prevention of gout flares; pt did trial off allopurinol and had a gout flare, so medication was resumed o Discussed low purine diet - handout provided . Patient self care activities - Over the next 90 days, patient will: o Continue current therapy  Overactive bladder . Pharmacist Clinical Goal(s) o Over the next 90 days, patient will work with PharmD and providers to optimize therapy . Current regimen:  o Solifenancin 10 mg daily . Interventions: o Discussed benefits of solifenacin; pt had confused it with sildenafil so was not taking; advised to take at bedtime to help  with nocturia . Patient self care activities - Over the next 90 days, patient will: o Continue current therapy  Anxiety / Insomnia . Pharmacist Clinical Goal(s) o Over the next 30 days, patient will work with PharmD and providers to optimize therapy . Current regimen:  o Fluoxetine 20 mg daily o Melatonin 5 mg at bedtime . Interventions: o Pt started fluoxetine about 2 weeks  ago and notes some improvement, wishes to try higher dose. Recommended pt to increase to 30 mg (1.5 tablets) daily o Discussed benefits of therapy - pt has not contacted therapist yet . Patient self care activities - Over the next 30 days, patient will: o Increase fluoxetine to 30 mg (1.5 tablet) daily o Contact psychiatrist to begin therapy  Medication management . Pharmacist Clinical Goal(s): o Over the next 90 days, patient will work with PharmD and providers to maintain optimal medication adherence . [facility] . Interventions o Comprehensive medication review performed. o Continue current medication management strategy o Advised to consult with Medicare expert to choose an optimal insurance plan for 2022 . Patient self care activities - Over the next 90 days, patient will: o Focus on medication adherence by fill date o Take medications as prescribed o Report any questions or concerns to PharmD and/or provider(s) o Contact [facility] [phone]) to set up a meeting with a Medicare advisor  Please see past updates related to this goal by clicking on the "Past Updates" button in the selected goal       There are no care plans to display for this patient.  The patient verbalized understanding of instructions, educational materials, and care plan provided today and declined offer to receive copy of patient instructions, educational materials, and care plan.  Telephone follow up appointment with pharmacy team member scheduled for: 1 month  Charlene Brooke, PharmD, [facility] Clinical Pharmacist [facility] at [facility] [phone]

## 2020-07-02 ENCOUNTER — Encounter: Payer: Self-pay | Admitting: Cardiology

## 2020-07-02 ENCOUNTER — Encounter: Payer: Self-pay | Admitting: Physical Therapy

## 2020-07-02 ENCOUNTER — Telehealth (INDEPENDENT_AMBULATORY_CARE_PROVIDER_SITE_OTHER): Payer: Medicare Other | Admitting: Cardiology

## 2020-07-02 ENCOUNTER — Ambulatory Visit: Payer: Medicare Other | Admitting: Physical Therapy

## 2020-07-02 VITALS — Ht 67.0 in | Wt 196.0 lb

## 2020-07-02 DIAGNOSIS — R2681 Unsteadiness on feet: Secondary | ICD-10-CM

## 2020-07-02 DIAGNOSIS — I10 Essential (primary) hypertension: Secondary | ICD-10-CM

## 2020-07-02 DIAGNOSIS — M6281 Muscle weakness (generalized): Secondary | ICD-10-CM | POA: Diagnosis not present

## 2020-07-02 DIAGNOSIS — I480 Paroxysmal atrial fibrillation: Secondary | ICD-10-CM | POA: Diagnosis not present

## 2020-07-02 DIAGNOSIS — E7800 Pure hypercholesterolemia, unspecified: Secondary | ICD-10-CM | POA: Diagnosis not present

## 2020-07-02 DIAGNOSIS — R2689 Other abnormalities of gait and mobility: Secondary | ICD-10-CM

## 2020-07-02 MED ORDER — DOXAZOSIN MESYLATE 8 MG PO TABS: 8.0000 mg | ORAL_TABLET | Freq: Every day | ORAL | 1 refills | Status: DC

## 2020-07-02 NOTE — Patient Instructions (Signed)
Medication Instructions:  ?Your physician recommends that you continue on your current medications as directed. Please refer to the Current Medication list given to you today. ? ?*If you need a refill on your cardiac medications before your next appointment, please call your pharmacy* ? ?Follow-Up: ?At [facility], you and your health needs are our priority.  As part of our continuing mission to provide you with exceptional heart care, we have created designated Provider Care Teams.  These Care Teams include your primary Cardiologist (physician) and Advanced Practice Providers (APPs -  Physician Assistants and Nurse Practitioners) who all work together to provide you with the care you need, when you need it. ? ?We recommend signing up for the patient portal called "MyChart".  Sign up information is provided on this After Visit Summary.  MyChart is used to connect with patients for Virtual Visits (Telemedicine).  Patients are able to view lab/test results, encounter notes, upcoming appointments, etc.  Non-urgent messages can be sent to your provider as well.   ?To learn more about what you can do with MyChart, go to [email or web address]://[email or web address].com.   ? ?Your next appointment:   ?6 month(s) ? ?The format for your next appointment:   ?In Person ? ?Provider:   ?Brian Crenshaw, MD { ? ? ? ?

## 2020-07-02 NOTE — Addendum Note (Signed)
Addended by: Binnie Rail on: 07/02/2020 09:54 AM   Modules accepted: Orders

## 2020-07-02 NOTE — Therapy (Signed)
[name], [name], [address] Phone: [phone]   Fax:  [phone]  Physical Therapy Treatment/Discharge Summary  Patient Details  Name: Jonathan [name] MRN: [id number] Date of Birth: 07-Oct-1932 Referring Provider (Jonathan [name]): Billey Gosling, MD   Encounter Date: 07/02/2020   Jonathan [name] End of Session - 07/02/20 1229    Visit Number 17    Number of Visits 18    Date for Jonathan [name] Re-Evaluation 07/27/20   POC for 4 weeks, Cert for 60 days   Authorization Type Medicare (10th Visit PN)    Progress Note Due on Visit 20    Jonathan [name] Start Time 1100    Jonathan [name] Stop Time 1137   full time not used due to D/C   Jonathan [name] Time Calculation (min) 37 min    Equipment Utilized During Treatment Gait belt    Activity Tolerance Patient tolerated treatment well    Behavior During Therapy [facility] for tasks assessed/performed           Past Medical History:  Diagnosis Date  . Atrial fibrillation (HCC)    a. paroxysmal, on Eliquis for anticoagulation  . BPH (benign prostatic hyperplasia)   . Bronchitis   . Cataract   . COPD (chronic obstructive pulmonary disease) (HCC)    2 liters O2 HS  . Depression   . Fatty tumor    waste and back  . Fibromyalgia   . GERD (gastroesophageal reflux disease)   . Glaucoma    bilateral eyes  . Gout   . Hereditary and idiopathic peripheral neuropathy 08/28/2015  . Hypertension   . Hypothyroidism   . Hypoxia   . Insomnia   . Osteoarthritis   . Osteoporosis   . Peripheral neuropathy   . Sleep apnea    uses cpap-add oxygen at night  . Spinal compression fracture (HCC) seventh vertebre  . Transient alteration of awareness 08/28/2015  . Type 2 diabetes mellitus ([place]) 03/08/2016    Past Surgical History:  Procedure Laterality Date  . APPENDECTOMY  age 58  . CARPAL TUNNEL RELEASE Right    early 2000s  . CATARACT EXTRACTION     bilateral  . CHOLECYSTECTOMY  age 20  . EYE SURGERY    . FOOT ARTHRODESIS Right  02/02/2013   Procedure: RIGHT HALLUX METATARSAL PHALANGEAL JOINT ARTHRODESIS ;  Surgeon: Wylene Simmer, MD;  Location: [place];  Service: Orthopedics;  Laterality: Right;  . INGUINAL HERNIA REPAIR  age 66   rt side  . NASAL CONCHA BULLOSA RESECTION  age 72  . PROSTATE SURGERY    . SHOULDER ARTHROSCOPY W/ ROTATOR CUFF REPAIR Right    early 2000s  . tonsil    . VASECTOMY  age 56    There were no vitals filed for this visit.   Subjective Assessment - 07/02/20 1101    Subjective Had family visit over the weekend. Has been sleeping better at night.    Pertinent History pAF on Eliquis, chronic pain on oxycodone, previous SDH, COPD on home O2 noct, HTN, hypothyroidism, OSA on CPAP, and DM, Neuropathy    Limitations Standing;Walking    Patient Stated Goals Improve the Balance; Improve Core Strength    Currently in Pain? Yes    Pain Score 2     Pain Location Back    Pain Orientation Lower    Pain Descriptors / Indicators Aching    Pain Type Chronic pain    Pain Onset Yesterday    Aggravating Factors  "  no idea"    Pain Relieving Factors "not sure"    Pain Onset Yesterday              [facility] Jonathan [name] Assessment - 07/02/20 1105      Timed Up and Go Test   Normal TUG (seconds) 9.09                              Balance Exercises - 07/02/20 1124      Balance Exercises: Standing   Standing Eyes Closed Wide (BOA);Foam/compliant surface;3 reps;20 secs    Standing Eyes Closed Limitations with feet together: 14 seconds, with feet hip width 2 x 30 seconds, feet hip width 2 x 5 reps head turns, 2 x 5 reps head nods with fingertip support on chair    SLS with Vectors Foam/compliant surface;Intermittent upper extremity assist    SLS with Vectors Limitations on 6 stepping stones next to countertop for SLS, min guard as needed for balance   Stepping Strategy Anterior;UE support;10 reps;Limitations    Stepping Strategy Limitations on rockerboard in A/P direction,  alternating forward stepping with use of UE support for balance    Rockerboard Anterior/posterior;EO;Limitations    Rockerboard Limitations standing on rockerboard, multidirectional reaching to grab bean bag and toss into crate x15 reps, cues for hip/ankle strategy for balance    Tandem Gait Forward;Limitations    Tandem Gait Limitations 4 reps down and back on  blue mat with UE support, cues for slowed and controlled    Step Over Hurdles / Cones 4 smaller hurdles next to countertop standing on blue compliant mat, down and back 3 times, intermittent touch for balance             Jonathan [name] Education - 07/02/20 1228    Education Details D/C, progress towards goals, if Jonathan [name]'s balance gets worse in the future then to get a new referral to come back to Jonathan [name]    Person(s) Educated Patient    Methods Explanation    Comprehension Verbalized understanding            Jonathan [name] Short Term Goals - 05/28/20 1350      Jonathan [name] SHORT TERM GOAL #1   Title = LTGs             Jonathan [name] Long Term Goals - 07/02/20 1104      Jonathan [name] LONG TERM GOAL #1   Title Patient will be independent with final balance/strengthening HEP (All LTGs Due: 06/28/20)    Baseline reviewed on 06/25/20, Jonathan [name] reports not performing consistently.    Time 4    Period Weeks    Status Partially Met      Jonathan [name] LONG TERM GOAL #2   Title Patient will improve FGA to >/= 24/30 to demonstrate reduced fall risk.    Baseline 19/30, 22/30 on 05/23/20, 26/30 on 06/25/20    Time 4    Period Weeks    Status Achieved      Jonathan [name] LONG TERM GOAL #3   Title Patient will demo ability to complete situation 4 of M-CTSIB for >/= 20 seconds    Baseline 6 seconds on situation 4, 14 seconds on condition 4 07/02/20    Time 4    Period Weeks    Status Not Met      Jonathan [name] LONG TERM GOAL #4   Title Patient will improve TUG to </= 10 secs to demonstrate improved functional mobility and reduced fall risk  Baseline 12.16, 11.69 seconds, 9.09 seconds on 07/02/20    Time 4    Period Weeks     Status Achieved             PHYSICAL THERAPY DISCHARGE SUMMARY  Visits from Start of Care: 17  Current functional level related to goals / functional outcomes: See LTGs   Remaining deficits: Impaired vestibular input for balance, impaired SLS.   Education / Equipment: HEP  Plan: Patient agrees to discharge.  Patient goals were partially met. Patient is being discharged due to meeting the stated rehab goals.  ?????         Plan - 07/02/20 1234    Clinical Impression Statement Focus of today's skilled session was assessing remainder of Jonathan [name]'s LTGs with Jonathan [name] meeting LTG #4 - improved TUG time to 9.09 seconds. Jonathan [name] did not meet LTG #3 in regards to condition 4 of mCTSIB, performed in 14 seconds today (improved from 6 seconds), but not quite to goal level. Remainder of session focused on balance strategies on compliant surfaces with UE support and min guard as needed for balance. Jonathan [name] in agreement to D/C at this time due to progress.    Personal Factors and Comorbidities Comorbidity 3+    Comorbidities pAF on Eliquis, chronic pain on oxycodone and Ambien, previous SDH, COPD on home O2 noct, HTN, hypothyroidism, OSA on CPAP, and DM    Examination-Activity Limitations Locomotion Level;Stairs    Examination-Participation Restrictions Community Activity;Yard Work    [employer] Evolving/Moderate complexity    Rehab Potential Good    Jonathan [name] Frequency 2x / week    Jonathan [name] Duration 4 weeks    Jonathan [name] Treatment/Interventions ADLs/Self Care Home Management;Electrical Stimulation;DME Instruction;Stair training;Functional mobility training;Gait training;Therapeutic activities;Therapeutic exercise;Balance training;Neuromuscular re-education;Patient/family education;Vestibular    Jonathan [name] Next Visit Plan D/C    Consulted and Agree with Plan of Care Patient           Patient will benefit from skilled therapeutic intervention in order to improve the following deficits and impairments:   Decreased balance,Decreased strength,Pain,Impaired sensation,Difficulty walking,Decreased endurance,Abnormal gait  Visit Diagnosis: Unsteadiness on feet  Muscle weakness (generalized)  Other abnormalities of gait and mobility     Problem List Patient Active Problem List   Diagnosis Date Noted  . Acute sinusitis 06/12/2020  . Anxiety 06/12/2020  . Recurrent falls 03/25/2020  . SDH (subdural hematoma) ([place]) 03/21/2020  . Intracranial hemorrhage ([place]) 03/14/2020  . Fall at home, subsequent encounter 03/14/2020  . Leukocytosis 03/14/2020  . Nausea 09/13/2019  . Rotator cuff arthropathy of left shoulder 07/21/2019  . Dupuytren's disease of palm 07/12/2019  . Sebaceous cyst 06/27/2019  . COVID-19 05/08/2019  . Acute encephalopathy 05/08/2019  . Obstructive sleep apnea   . Asymmetrical left sensorineural hearing loss 07/12/2018  . Chronic anticoagulation 12/21/2017  . Compression fracture of thoracic vertebra ([place]) 09/21/2017  . Degeneration of thoracic intervertebral disc 09/21/2017  . Thoracic back pain 08/10/2017  . Degenerative joint disease of hand 07/14/2017  . Osteoarthritis   . Hypoxia   . Fibromyalgia   . Fatty tumor   . Cataract   . BPH (benign prostatic hyperplasia)   . Chronic atrial fibrillation ([place])   . Insomnia 03/10/2017  . Chronic pain, legs and back 03/10/2017  . Hyperlipidemia 03/10/2017  . Obesity (BMI 30.0-34.9) 12/08/2016  . Depression 09/07/2016  . Spondylolisthesis 09/07/2016  . Hypothyroidism 03/08/2016  . GERD (gastroesophageal reflux disease) 03/08/2016  . Gout 03/08/2016  . Glaucoma 03/08/2016  . Type 2 diabetes mellitus (  [place]) 03/08/2016  . Hereditary and idiopathic peripheral neuropathy 08/28/2015  . Essential hypertension 12/04/2011  . Allergic rhinitis, seasonal 12/04/2011  . COPD (chronic obstructive pulmonary disease) ([place]) 07/18/2011    Jonathan [name], Jonathan [name], Jonathan [name]  07/02/2020, 12:34 PM  [facility] [address] [place], [name], [address] Phone: [phone]   Fax:  [phone]  Name: Jonathan [name] MRN: [id number] Date of Birth: Jun 22, 1932

## 2020-07-05 DIAGNOSIS — Z1152 Encounter for screening for COVID-19: Secondary | ICD-10-CM | POA: Diagnosis not present

## 2020-07-12 DIAGNOSIS — N5201 Erectile dysfunction due to arterial insufficiency: Secondary | ICD-10-CM | POA: Diagnosis not present

## 2020-07-12 DIAGNOSIS — R3914 Feeling of incomplete bladder emptying: Secondary | ICD-10-CM | POA: Diagnosis not present

## 2020-07-12 DIAGNOSIS — N401 Enlarged prostate with lower urinary tract symptoms: Secondary | ICD-10-CM | POA: Diagnosis not present

## 2020-07-12 DIAGNOSIS — R351 Nocturia: Secondary | ICD-10-CM | POA: Diagnosis not present

## 2020-07-25 DIAGNOSIS — Z23 Encounter for immunization: Secondary | ICD-10-CM | POA: Diagnosis not present

## 2020-07-30 ENCOUNTER — Other Ambulatory Visit: Payer: Self-pay | Admitting: Internal Medicine

## 2020-07-30 NOTE — Telephone Encounter (Signed)
MD is out of the office this week. pls advise on refill../lmb 

## 2020-07-31 ENCOUNTER — Other Ambulatory Visit: Payer: Self-pay

## 2020-07-31 ENCOUNTER — Ambulatory Visit (INDEPENDENT_AMBULATORY_CARE_PROVIDER_SITE_OTHER): Payer: Medicare Other | Admitting: Pharmacist

## 2020-07-31 DIAGNOSIS — I10 Essential (primary) hypertension: Secondary | ICD-10-CM

## 2020-07-31 DIAGNOSIS — E1165 Type 2 diabetes mellitus with hyperglycemia: Secondary | ICD-10-CM

## 2020-07-31 DIAGNOSIS — N401 Enlarged prostate with lower urinary tract symptoms: Secondary | ICD-10-CM

## 2020-07-31 DIAGNOSIS — R351 Nocturia: Secondary | ICD-10-CM | POA: Diagnosis not present

## 2020-07-31 DIAGNOSIS — F3289 Other specified depressive episodes: Secondary | ICD-10-CM | POA: Diagnosis not present

## 2020-07-31 DIAGNOSIS — E782 Mixed hyperlipidemia: Secondary | ICD-10-CM

## 2020-07-31 DIAGNOSIS — I482 Chronic atrial fibrillation, unspecified: Secondary | ICD-10-CM | POA: Diagnosis not present

## 2020-07-31 DIAGNOSIS — G4700 Insomnia, unspecified: Secondary | ICD-10-CM

## 2020-07-31 NOTE — Progress Notes (Signed)
Chronic Care Management Pharmacy Note  07/31/2020 Name:  Jonathan [name] MRN:  [id number] DOB:  1933-05-15  Subjective: Jonathan [name] is an 87 y.o. year old male who is a primary patient of Burns, Claudina Lick, MD.  The CCM team was consulted for assistance with disease management and care coordination needs.    Engaged with patient by telephone for follow up visit in response to provider referral for pharmacy case management and/or care coordination services.   Consent to Services:  The patient was given the following information about Chronic Care Management services today, agreed to services, and gave verbal consent: 1. CCM service includes personalized support from designated clinical staff supervised by the primary care provider, including individualized plan of care and coordination with other care providers 2. 24/7 contact phone numbers for assistance for urgent and routine care needs. 3. Service will only be billed when office clinical staff spend 20 minutes or more in a month to coordinate care. 4. Only one practitioner may furnish and bill the service in a calendar month. 5.The patient may stop CCM services at any time (effective at the end of the month) by phone call to the office staff. 6. The patient will be responsible for cost sharing (co-pay) of up to 20% of the service fee (after annual deductible is met). Patient agreed to services and consent obtained.  Patient Care Team: Binnie Rail, MD as PCP - General (Internal Medicine) Stanford Breed Denice Bors, MD as PCP - Cardiology (Cardiology) Council Mechanic, MD as Referring Physician (Family Medicine) Earnie Larsson, MD as Consulting Physician (Neurosurgery) Stanford Breed Denice Bors, MD as Consulting Physician (Cardiology) Michael Boston, MD as Consulting Physician (General Surgery) Karen Kays, NP as Nurse Practitioner (Nurse Practitioner) [name], [facility] as Pharmacist (Pharmacist)  Recent office visits: 06/18/19 Dr Quay Burow OV: c/o  poor sleep, not currently taking medication. Retry melatonin. Probable gout flare, restart allopurinol 300 mg. Taper off duloxetine and start fluoxetine 20 mg daily. Consider therapist.  06/12/20 Dr Alain Marion OV: pt c/o insomnia, panic attacks, anxiety, sinusitis. Reduce Cymbalta to 1 daily. Discontinue Buspar. Rx low-dose lorazepam at night, stop alcohol. Rx'd ceftin.  05/24/20 pt message: panic attacks, rx'd buspar TID 04/18/20 Dr Quay Burow OV: chronic f/u. Stay off allopurinol. Restart Eliquis and f/u with cardiology.  03/25/20 Dr Quay Burow OV: hospital f/u. Recurrent falls - related to Ambien, will avoid sleep meds, taking melatonin only. Referred,  to PT for balance. Denies GERD, will taper off omeprazole. D/C allopurinol for now, check uric acid next visit. Increase metformin to BID.  Recent consult visits: 07/02/20 Dr Stanford Breed (cardiology): f/u Afib, continue Eliquis for now. If he has future falls will discontinue.  03/21/20 PA Kerin Ransom (cardiology): hospital f/u. Holding Eliquis, may restart soon given hx stroke off of Eliquis in 2019. Considering ARB for BP.  Hospital visits: 03/14/20 - 03/15/20 hospital admission: intracranial hemorrhage s/p multiple falls. [name]. Hold Eliquis (hx of TIA on previous holiday) and counseled on fall prevention (avoidance of Ambien, oxycodone, alcohol). Stopped Ambien and Sonata, rx'd trazodone.  Objective:  Lab Results  Component Value Date   CREATININE 1.16 06/12/2020   BUN 22 06/12/2020   GFR 57.63 (L) 06/12/2020   GFRNONAA >60 03/14/2020   GFRAA >60 03/14/2020   NA 138 06/12/2020   K 3.6 06/12/2020   CALCIUM 9.6 06/12/2020   CO2 27 06/12/2020    Lab Results  Component Value Date/Time   HGBA1C 7.5 (H) 06/12/2020 11:31 AM   HGBA1C 8.1 (H) 03/14/2020  09:45 PM   GFR 57.63 (L) 06/12/2020 11:31 AM   GFR 68.75 09/18/2019 09:40 AM   MICROALBUR 28.1 (H) 08/11/2017 08:48 AM   MICROALBUR 21.2 (H) 12/08/2016 11:43 AM    Last diabetic Eye exam:   Lab Results  Component Value Date/Time   HMDIABEYEEXA No Retinopathy 02/01/2019 12:00 AM    Last diabetic Foot exam: No results found for: HMDIABFOOTEX   Lab Results  Component Value Date   CHOL 111 08/14/2019   HDL 37.60 (L) 08/14/2019   LDLCALC 66 11/11/2018   LDLDIRECT 51.0 08/14/2019   TRIG 211.0 (H) 08/14/2019   CHOLHDL 3 08/14/2019    Hepatic Function Latest Ref Rng & Units 06/12/2020 03/14/2020 09/18/2019  Total Protein 6.0 - 8.3 g/dL 6.7 6.7 6.7  Albumin 3.5 - 5.2 g/dL 4.6 4.5 4.5  AST 0 - 37 U/L _0 ALT 0 - 53 U/L _1 Alk Phosphatase 39 - 117 U/L 102 88 91  Total Bilirubin 0.2 - 1.2 mg/dL 0.5 0.4 0.5    Lab Results  Component Value Date/Time   TSH 2.58 06/12/2020 11:31 AM   TSH 3.13 08/14/2019 10:28 AM    CBC Latest Ref Rng & Units 06/12/2020 03/15/2020 03/14/2020  WBC 4.0 - 10.5 K/uL 6.6 13.3(H) 17.0(H)  Hemoglobin 13.0 - 17.0 g/dL 13.4 12.5(L) 13.3  Hematocrit 39.0 - 52.0 % 40.6 37.3(L) 41.3  Platelets 150.0 - 400.0 K/uL 120.0(L) 126(L) 125(L)    Lab Results  Component Value Date/Time   VD25OH 90.58 06/12/2020 11:31 AM   VD25OH 41 11/23/2012 10:52 AM    Clinical ASCVD: Yes  The ASCVD Risk score Mikey Bussing DC Jr., et al., 2013) failed to calculate for the following reasons:   The 2013 ASCVD risk score is only valid for ages 88 to 18    Depression screen PHQ 2/9 06/12/2020 04/26/2020 08/14/2019  Decreased Interest 1 0 2  Down, Depressed, Hopeless 2 0 2  PHQ - 2 Score 3 0 4  Altered sleeping 3 - 3  Tired, decreased energy 0 - 2  Change in appetite 0 - 2  Feeling bad or failure about yourself  0 - 2  Trouble concentrating 0 - 3  Moving slowly or fidgety/restless 0 - 0  Suicidal thoughts 0 - 0  PHQ-9 Score 6 - 16  Difficult doing work/chores - - -  Some recent data might be hidden     CHA2DS2-VASc Score = 6  The patient's score is based upon: CHF History: No HTN History: Yes Diabetes History: Yes Stroke History: Yes Vascular Disease  History: No Age Score: 2 Gender Score: 0     Social History   Tobacco Use  Smoking Status Former Smoker  . Packs/day: 2.00  . Years: 10.00  . Pack years: 20.00  . Types: Cigarettes  . Quit date: 07/18/1975  . Years since quitting: 45.0  Smokeless Tobacco Never Used   BP Readings from Last 3 Encounters:  06/18/20 (!) 142/78  06/17/20 (!) 142/68  06/12/20 140/68   Pulse Readings from Last 3 Encounters:  06/17/20 (!) 58  06/12/20 (!) 57  06/11/20 84   Wt Readings from Last 3 Encounters:  07/02/20 196 lb (88.9 kg)  06/17/20 198 lb 3.2 oz (89.9 kg)  06/12/20 196 lb (88.9 kg)    Assessment/Interventions: Review of patient past medical history, allergies, medications, health status, including review of consultants reports, laboratory and other test data, was performed as part of comprehensive evaluation and provision of chronic  care management services.   SDOH:  (Social Determinants of Health) assessments and interventions performed: Yes   CCM Care Plan  Allergies  Allergen Reactions  . Other Other (See Comments)    DUST-Other reaction(s): Respiratory distress    Medications Reviewed Today    Reviewed by Arliss Journey, PT (Physical Therapist) on 07/02/20 at 1101  Med List Status: <None>  Medication Order Taking? Sig Documenting Provider Last Dose Status Informant  acetaminophen (TYLENOL) 650 MG CR tablet [id number] No Take 650 mg by mouth in the morning and at bedtime. Pt takes 2 tabs 2 times daily [provider] Taking Active   allopurinol (ZYLOPRIM) 300 MG tablet [id number] No Take 1 tablet (300 mg total) by mouth every evening. For gout Burns, Claudina Lick, MD Taking Active   apixaban (ELIQUIS) 5 MG TABS tablet [id number] No Take 1 tablet (5 mg total) by mouth 2 (two) times daily. Binnie Rail, MD Taking Active   atorvastatin (LIPITOR) 20 MG tablet [id number] No Take 1 tablet (20 mg total) by mouth daily. For cholesterol Binnie Rail, MD Taking Active    bimatoprost (LUMIGAN) 0.01 % SOLN [id number] No Place 1 drop into both eyes at bedtime.  [provider] Taking Active Self  Biotin 5000 MCG TABS [id number] No Take 5,000 mcg by mouth daily. [provider] Taking Active Self  calcium-vitamin D (OSCAL WITH D) 500-200 MG-UNIT tablet [id number] No Take 1 tablet by mouth daily with breakfast. [provider] Taking Active Self  colchicine 0.6 MG tablet [id number] No Take 0.6 mg by mouth daily as needed (flareups).  [provider] Taking Active Self  diltiazem (CARDIZEM CD) 300 MG 24 hr capsule [id number] No Take 1 capsule (300 mg total) by mouth daily. Lelon Perla, MD Taking Active   diltiazem (CARDIZEM) 30 MG tablet [id number] No Take 1 tablet (30 mg total) by mouth 2 (two) times daily as needed (sustained elevated heart rates >100 bpm for > 20 minutes.). Darreld Mclean, PA-C Taking Active   doxazosin (CARDURA) 8 MG tablet [id number]  Take 1 tablet (8 mg total) by mouth daily. Binnie Rail, MD  Active   FLUoxetine (PROZAC) 20 MG tablet [id number] No Take 1 tablet (20 mg total) by mouth daily. Start after you are off the cymbalta  Patient taking differently: Take 30 mg by mouth daily. Start after you are off the cymbalta   Binnie Rail, MD Taking Active   glucose blood test strip [id number] No Use to check blood sugar daily. E11.9 Binnie Rail, MD Taking Active Self  hydrochlorothiazide (HYDRODIURIL) 25 MG tablet [id number] No Take 1 tablet (25 mg total) by mouth daily. For blood pressure Binnie Rail, MD Taking Active   Lancets MISC [id number] No Use to check blood sugars daily. E11.9 Binnie Rail, MD Taking Active Self  levothyroxine (SYNTHROID) 112 MCG tablet [id number] No Take 1 tablet (112 mcg total) by mouth daily before breakfast. For thyroid Binnie Rail, MD Taking Active   magnesium oxide (MAG-OX) 400 MG tablet [id number] No Take 400 mg by mouth daily. [provider] Taking Active Self            Med Note Spero Curb, GREG A   Sat Mar 07, 2016 11:01 PM)    Melatonin 5 MG TABS [id number] No Take 5 mg by mouth at bedtime. [provider] Taking Active Self  metFORMIN (GLUCOPHAGE-XR) 750 MG 24 hr tablet [id number] No Take 1 tablet (750 mg total) by  mouth in the morning and at bedtime. For diabetes Binnie Rail, MD Taking Active   mometasone (NASONEX) 50 MCG/ACT nasal spray [id number] No Place 2 sprays into the nose daily.  Patient taking differently: Place 2 sprays into the nose as needed (allergies).   Binnie Rail, MD Taking Active   Multiple Vitamin (MULTIVITAMIN) tablet [id number] No Take 1 tablet by mouth daily. [provider] Taking Active Self  nitroGLYCERIN (NITROSTAT) 0.4 MG SL tablet [id number] No Place 1 tablet (0.4 mg total) under the tongue every 5 (five) minutes as needed for chest pain. Barrett, Evelene Croon, PA-C Taking Active Self  omeprazole (PRILOSEC) 20 MG capsule [id number] No TAKE 1 CAPSULE BY MOUTH EVERY DAY.  For heartburn Binnie Rail, MD Taking Active   potassium chloride SA (KLOR-CON M20) 20 MEQ tablet [id number] No Take 1 tablet (20 mEq total) by mouth daily. For low potassium Binnie Rail, MD Taking Active   RESTASIS 0.05 % ophthalmic emulsion [id number] No Place 1 drop into both eyes 2 (two) times daily.  [provider] Taking Active Self  sildenafil (REVATIO) 20 MG tablet [id number] No Take 40-100 mg by mouth daily as needed. [provider] Taking Active Self  solifenacin (VESICARE) 10 MG tablet [id number] No Take 10 mg by mouth daily. [provider] Taking Active   vitamin C (ASCORBIC ACID) 500 MG tablet [id number] No Take 500 mg by mouth 2 (two) times daily.  [provider] Taking Active Self  VITAMIN D, CHOLECALCIFEROL, PO [id number] No Take 25 mcg by mouth daily.  [provider] Taking Active Self          Patient Active Problem List   Diagnosis Date Noted  . Acute sinusitis 06/12/2020  .  Anxiety 06/12/2020  . Recurrent falls 03/25/2020  . SDH (subdural hematoma) ([place]) 03/21/2020  . Intracranial hemorrhage ([place]) 03/14/2020  . Fall at home, subsequent encounter 03/14/2020  . Leukocytosis 03/14/2020  . Nausea 09/13/2019  . Rotator cuff arthropathy of left shoulder 07/21/2019  . Dupuytren's disease of palm 07/12/2019  . Sebaceous cyst 06/27/2019  . COVID-19 05/08/2019  . Acute encephalopathy 05/08/2019  . Obstructive sleep apnea   . Asymmetrical left sensorineural hearing loss 07/12/2018  . Chronic anticoagulation 12/21/2017  . Compression fracture of thoracic vertebra ([place]) 09/21/2017  . Degeneration of thoracic intervertebral disc 09/21/2017  . Thoracic back pain 08/10/2017  . Degenerative joint disease of hand 07/14/2017  . Osteoarthritis   . Hypoxia   . Fibromyalgia   . Fatty tumor   . Cataract   . BPH (benign prostatic hyperplasia)   . Chronic atrial fibrillation ([place])   . Insomnia 03/10/2017  . Chronic pain, legs and back 03/10/2017  . Hyperlipidemia 03/10/2017  . Obesity (BMI 30.0-34.9) 12/08/2016  . Depression 09/07/2016  . Spondylolisthesis 09/07/2016  . Hypothyroidism 03/08/2016  . GERD (gastroesophageal reflux disease) 03/08/2016  . Gout 03/08/2016  . Glaucoma 03/08/2016  . Type 2 diabetes mellitus ([place]) 03/08/2016  . Hereditary and idiopathic peripheral neuropathy 08/28/2015  . Essential hypertension 12/04/2011  . Allergic rhinitis, seasonal 12/04/2011  . COPD (chronic obstructive pulmonary disease) ([place]) 07/18/2011    Immunization History  Administered Date(s) Administered  . Fluad Quad(high Dose 65+) 03/25/2020  . Hepatitis A 07/25/2007, 01/22/2008  . Hepatitis B 04/30/1988, 06/02/1988, 10/28/1988  . IPV 05/15/1996  . Influenza Split 02/19/2012, 03/13/2016  . Influenza Whole 02/17/2011, 03/13/2013  . Influenza, High Dose Seasonal PF 03/10/2017, 02/15/2018, 02/09/2019  . Influenza,inj,Quad PF,6+ Mos 03/15/2015  .  Influenza,inj,quad, With  Preservative 02/14/2019  . Meningococcal Polysaccharide 05/15/1996  . Moderna Sars-Covid-2 Vaccination 07/15/2019, 08/12/2019, 02/15/2020  . Pneumococcal Conjugate-13 12/08/2016  . Pneumococcal-Unspecified 02/01/2006  . Td 01/14/1995  . Tdap 03/06/2018  . Zoster 02/01/2006  . Zoster Recombinat (Shingrix) 01/15/2017, 01/04/2018    Conditions to be addressed/monitored:  Hypertension, Hyperlipidemia, Diabetes, Anxiety, Overactive Bladder, BPH and Gout, Insomnia  Care Plan : [place]  Updates made by [name], [place] since 07/31/2020 12:00 AM    Problem: Hypertension, Hyperlipidemia, Diabetes, Anxiety, Overactive Bladder, BPH and Gout, Insomnia   Priority: High    Long-Range Goal: Disease management   Start Date: 07/31/2020  Expected End Date: 01/28/2021  This Visit's Progress: On track  Priority: High  Note:   Current Barriers:  . Unable to independently monitor therapeutic efficacy . Unable to maintain control of insomnia/mood  Pharmacist Clinical Goal(s):  Marland Kitchen Over the next 30 days, patient will achieve adherence to monitoring guidelines and medication adherence to achieve therapeutic efficacy . maintain control of insomnia/mood as evidenced by patient report  through collaboration with PharmD and provider.   Interventions: . 1:1 collaboration with Binnie Rail, MD regarding development and update of comprehensive plan of care as evidenced by provider attestation and co-signature . Inter-disciplinary care team collaboration (see longitudinal plan of care) . Comprehensive medication review performed; medication list updated in electronic medical record  Hypertension / AFIB (BP goal < 140/90) CHADSVASC = 6. Hx embolic stroke off of Eliquis in 2019. Hx SDH after fall on Eliquis (last 03/2020).  Current regimen:  ? HCTZ 25 mg daily ? Doxazosin 8 mg daily ? Diltiazem CD 300 mg daily ? Diltiazem IR 30 mg PRN HR > 100 ? Eliquis 5 mg  BID Interventions: ? Discussed BP goals and benefits of medications for prevention of heart attack / stroke Patient self care activities  ? Check BP daily, document, and provide at future appointments ? Ensure daily salt intake < 2300 mg/day   Hyperlipidemia (LDL goal < 70) Controlled Current regimen:  ? Atorvastatin 20 mg daily ? Nitroglycerin 0.4 mg SL prn Interventions: ? Discussed cholesterol goals and benefits of medications for prevention of heart attack / stroke Patient self care activities  ? Continue current medications   Diabetes (A1c goal < 8%) Controlled Current regimen:  ? Metformin ER 750 mg twice a day ? Testing supplies Interventions: ? Discussed blood sugar goals and benefits of medications for prevention of diabetic complications Patient self care activities  ? Check blood sugar before breakfast, document, and provide at future appointments ? Contact provider with any episodes of hypoglycemia   Gout Controlled Current regimen:  ? Allopurinol 300 mg daily ? Colchicine 0.6 mg as needed for flares Interventions: ? Benefits of allopurinol for prevention of gout flares; pt did trial off allopurinol and had a gout flare, so medication was resumed ? Discussed low purine diet - handout provided Patient self care activities  ? Continue current therapy   Overactive bladder / BPH Improved Current regimen:  ? Solifenancin 10 mg daily ? Doxazosin 8 mg daily ? Tamsulosin 0.4 mg daily Interventions: ? Pt noted mild improvement in symptoms after restarting daily solifenacin Patient self care activities  ? Continue current therapy   Anxiety / Insomnia Improved? Current regimen:  ? Fluoxetine 20 mg daily ? Melatonin 5 mg at bedtime Past tried/failed medications: bupropion, sertraline, Belsomra, trazodone, duloxetine, buspar, ambien, sonata, lorazepam Interventions: ? Patient was advised last month due increase fluoxetine to  30 mg by taking 1.5 tablets. He  reports today that he has been taking 1.5 tab of lorazepam, which was discontinued by provider. He is now out of lorazepam. Advised him again to try fluoxetine 30 mg. He wrote it on the bottle so he would not forget. ? Recommended to increase melatonin to 10 mg at bedtime ? Discussed benefits of therapy - pt has not contacted therapist yet Patient self care activities ? Increase fluoxetine to 30 mg (1.5 tablet) daily at bedtime ? Increase melatonin to 10 mg (2 tablets) at bedtime ? Contact psychiatrist to begin therapy  Patient Goals/Self-Care Activities . Over the next 30 days, patient will:  - take medications as prescribed focus on medication adherence by pill box  Follow Up Plan: Telephone follow up appointment with care management team member scheduled for: 3 months      Medication Assistance: None required.  Patient affirms current coverage meets needs.  Patient's preferred pharmacy is:  [facility] [address], [name] [address] Phone: [phone] Fax: [phone]  [id number]: [phone]: [phone] Uses pill box? Yes Pt endorses 90% compliance  We discussed: Current pharmacy is preferred with insurance plan and patient is satisfied with pharmacy services Patient decided to: Continue current medication management strategy  Care Plan and Follow Up Patient Decision:  Patient agrees to Care Plan and Follow-up.  Plan: Telephone follow up appointment with care management team member scheduled for:  1 month  LCharlene Brooke PharmD, BHoag Hospital IrvineClinical Pharmacist LRopesvillePrimary Care at [phone]

## 2020-07-31 NOTE — Patient Instructions (Signed)
Visit Information  Phone number for Pharmacist: [phone]  Goals Addressed   None    Patient Care Plan: CCM Pharmacy Care Plan    Problem Identified: Hypertension, Hyperlipidemia, Diabetes, Anxiety, Overactive Bladder, BPH and Gout, Insomnia   Priority: High    Long-Range Goal: Disease management   Start Date: 07/31/2020  Expected End Date: 01/28/2021  This Visit's Progress: On track  Priority: High  Note:   Current Barriers:  . Unable to independently monitor therapeutic efficacy . Unable to maintain control of insomnia/mood  Pharmacist Clinical Goal(s):  Marland Kitchen Over the next 30 days, patient will achieve adherence to monitoring guidelines and medication adherence to achieve therapeutic efficacy . maintain control of insomnia/mood as evidenced by patient report  through collaboration with PharmD and provider.   Interventions: . 1:1 collaboration with Binnie Rail, MD regarding development and update of comprehensive plan of care as evidenced by provider attestation and co-signature . Inter-disciplinary care team collaboration (see longitudinal plan of care) . Comprehensive medication review performed; medication list updated in electronic medical record  Hypertension / AFIB (BP goal < 140/90) CHADSVASC = 6. Hx embolic stroke off of Eliquis in 2019. Hx SDH after fall on Eliquis (last 03/2020).  Current regimen:  ? HCTZ 25 mg daily ? Doxazosin 8 mg daily ? Diltiazem CD 300 mg daily ? Diltiazem IR 30 mg PRN HR > 100 ? Eliquis 5 mg BID Interventions: ? Discussed BP goals and benefits of medications for prevention of heart attack / stroke Patient self care activities  ? Check BP daily, document, and provide at future appointments ? Ensure daily salt intake < 2300 mg/day   Hyperlipidemia (LDL goal < 70) Controlled Current regimen:  ? Atorvastatin 20 mg daily ? Nitroglycerin 0.4 mg SL prn Interventions: ? Discussed cholesterol goals and benefits of medications for  prevention of heart attack / stroke Patient self care activities  ? Continue current medications   Diabetes (A1c goal < 8%) Controlled Current regimen:  ? Metformin ER 750 mg twice a day ? Testing supplies Interventions: ? Discussed blood sugar goals and benefits of medications for prevention of diabetic complications Patient self care activities  ? Check blood sugar before breakfast, document, and provide at future appointments ? Contact provider with any episodes of hypoglycemia   Gout Controlled Current regimen:  ? Allopurinol 300 mg daily ? Colchicine 0.6 mg as needed for flares Interventions: ? Benefits of allopurinol for prevention of gout flares; pt did trial off allopurinol and had a gout flare, so medication was resumed ? Discussed low purine diet - handout provided Patient self care activities  ? Continue current therapy   Overactive bladder / BPH Improved Current regimen:  ? Solifenancin 10 mg daily ? Doxazosin 8 mg daily ? Tamsulosin 0.4 mg daily Interventions: ? Pt noted mild improvement in symptoms after restarting daily solifenacin Patient self care activities  ? Continue current therapy   Anxiety / Insomnia Improved? Current regimen:  ? Fluoxetine 20 mg daily ? Melatonin 5 mg at bedtime Past tried/failed medications: bupropion, sertraline, Belsomra, trazodone, duloxetine, buspar, ambien, sonata, lorazepam Interventions: ? Patient was advised last month due increase fluoxetine to 30 mg by taking 1.5 tablets. He reports today that he has been taking 1.5 tab of lorazepam, which was discontinued by provider. He is now out of lorazepam. Advised him again to try fluoxetine 30 mg. He wrote it on the bottle so he would not forget. ? Recommended to increase melatonin to 10 mg at bedtime ?  Discussed benefits of therapy - pt has not contacted therapist yet Patient self care activities ? Increase fluoxetine to 30 mg (1.5 tablet) daily at bedtime ? Increase  melatonin to 10 mg (2 tablets) at bedtime ? Contact psychiatrist to begin therapy  Patient Goals/Self-Care Activities . Over the next 30 days, patient will:  - take medications as prescribed focus on medication adherence by pill box  Follow Up Plan: Telephone follow up appointment with care management team member scheduled for: 3 months     The patient verbalized understanding of instructions, educational materials, and care plan provided today and declined offer to receive copy of patient instructions, educational materials, and care plan.  Telephone follow up appointment with pharmacy team member scheduled for: 1 month  Charlene Brooke, PharmD, [facility] Clinical Pharmacist [place] [facility] at [facility] [phone]

## 2020-08-05 ENCOUNTER — Other Ambulatory Visit: Payer: Self-pay | Admitting: Cardiology

## 2020-08-05 DIAGNOSIS — I480 Paroxysmal atrial fibrillation: Secondary | ICD-10-CM

## 2020-08-08 ENCOUNTER — Other Ambulatory Visit: Payer: Self-pay | Admitting: Cardiology

## 2020-08-08 NOTE — Telephone Encounter (Signed)
*  STAT* If patient is at the pharmacy, call can be transferred to refill team.   1. Which medications need to be refilled? (please list name of each medication and dose if known) apixaban (ELIQUIS) 5 MG TABS tablet  2. Which pharmacy/location (including street and city if local pharmacy) is medication to be sent to? [place]: [place], [place], [place] [address]  3. Do they need a 30 day or 90 day supply? [place]

## 2020-08-09 MED ORDER — APIXABAN 5 MG PO TABS: 5.0000 mg | ORAL_TABLET | Freq: Two times a day (BID) | ORAL | 1 refills | Status: DC

## 2020-08-09 NOTE — Telephone Encounter (Signed)
Prescription refill request for Eliquis received. Indication: atrial fibrillation Last office visit: 1/22 c[place] Scr:1.1  12/21 Age: 87 Weight:88.9 kg  Prescription refilled

## 2020-08-16 ENCOUNTER — Telehealth: Payer: Self-pay | Admitting: *Deleted

## 2020-08-16 NOTE — Telephone Encounter (Signed)
Rec'd fax pt needing PA for Lorazepam 0.5 mg. Completed via cover-my-meds w/ (Key: [id number]). Rec'd msg stating " [facility] is reviewing your PA request and will respond within 24 hours for Medicaid or up to 72 hours for non-Medicaid plans"...Jonathan [name]

## 2020-08-19 NOTE — Telephone Encounter (Signed)
Rec'd coverage determination med was approved through August 18, 2021. Faxed approval to [email or web address] Chess

## 2020-08-29 ENCOUNTER — Other Ambulatory Visit: Payer: Self-pay

## 2020-08-29 ENCOUNTER — Ambulatory Visit (INDEPENDENT_AMBULATORY_CARE_PROVIDER_SITE_OTHER): Payer: Medicare Other | Admitting: Pharmacist

## 2020-08-29 DIAGNOSIS — E782 Mixed hyperlipidemia: Secondary | ICD-10-CM | POA: Diagnosis not present

## 2020-08-29 DIAGNOSIS — R351 Nocturia: Secondary | ICD-10-CM

## 2020-08-29 DIAGNOSIS — I482 Chronic atrial fibrillation, unspecified: Secondary | ICD-10-CM | POA: Diagnosis not present

## 2020-08-29 DIAGNOSIS — I10 Essential (primary) hypertension: Secondary | ICD-10-CM | POA: Diagnosis not present

## 2020-08-29 DIAGNOSIS — G4700 Insomnia, unspecified: Secondary | ICD-10-CM

## 2020-08-29 DIAGNOSIS — N401 Enlarged prostate with lower urinary tract symptoms: Secondary | ICD-10-CM | POA: Diagnosis not present

## 2020-08-29 DIAGNOSIS — F3289 Other specified depressive episodes: Secondary | ICD-10-CM

## 2020-08-29 DIAGNOSIS — M109 Gout, unspecified: Secondary | ICD-10-CM

## 2020-08-29 DIAGNOSIS — E1165 Type 2 diabetes mellitus with hyperglycemia: Secondary | ICD-10-CM

## 2020-08-29 DIAGNOSIS — F419 Anxiety disorder, unspecified: Secondary | ICD-10-CM

## 2020-08-29 NOTE — Patient Instructions (Signed)
Visit Information  Phone number for Pharmacist: [phone]  Goals Addressed   None    Patient Care Plan: [facility]CM Pharmacy Care Plan    Problem Identified: Hypertension, Hyperlipidemia, Diabetes, Anxiety, Overactive Bladder, BPH and Gout, Insomnia   Priority: High    Long-Range Goal: Disease management   Start Date: 07/31/2020  Expected End Date: 01/28/2021  This Visit's Progress: On track  Recent Progress: On track  Priority: High  Note:   Current Barriers:  . Unable to independently monitor therapeutic efficacy . Unable to maintain control of insomnia/mood  Pharmacist Clinical Goal(s):  Marland Kitchen Patient will achieve adherence to monitoring guidelines and medication adherence to achieve therapeutic efficacy . maintain control of insomnia/mood as evidenced by patient report  through collaboration with PharmD and provider.   Interventions: . 1:1 collaboration with Binnie Rail, MD regarding development and update of comprehensive plan of care as evidenced by provider attestation and co-signature . Inter-disciplinary care team collaboration (see longitudinal plan of care) . Comprehensive medication review performed; medication list updated in electronic medical record  Hypertension / AFIB (BP goal < 140/90) Controlled - BP at goal/close to goal in recent office visits CHADSVASC = 6. Hx embolic stroke off of Eliquis in 2019. Hx SDH after fall on Eliquis (last 03/2020).  Current regimen:  ? HCTZ 25 mg daily ? Diltiazem CD 300 mg daily ? Diltiazem IR 30 mg PRN HR > 100 ? Eliquis 5 mg BID Interventions: ? Discussed BP goals and benefits of medications for prevention of heart attack / stroke ? Recommend to continue current medication   Hyperlipidemia (LDL goal < 70) Controlled Current regimen:  ? Atorvastatin 20 mg daily ? Nitroglycerin 0.4 mg SL prn Interventions: ? Discussed cholesterol goals and benefits of medications for prevention of heart attack / stroke ? Recommend to  continue current medication   Diabetes (A1c goal < 8%) Controlled Current regimen:  ? Metformin ER 750 mg twice a day ? Testing supplies Interventions: ? Discussed blood sugar goals and benefits of medications for prevention of diabetic complications ? Recommend to continue checking blood sugar before breakfast and keeping log of values ? Recommend to continue current medication   Gout Controlled Current regimen:  ? Allopurinol 300 mg daily ? Colchicine 0.6 mg as needed for flares Interventions: ? Benefits of allopurinol for prevention of gout flares; pt did trial off allopurinol and had a gout flare, so medication was resumed ? Discussed low purine diet - handout provided previously ? Recommend to continue current medication   Overactive bladder / BPH Improved - pt notes improvement in symptoms after he re-started solifenacin Current regimen:  ? Solifenancin 10 mg daily ? Tamsulosin 0.4 mg daily Interventions: ? Affirmed that patient has stopped doxazosin when tamsulosin was prescribed (per urologist) ? Recommend to continue current medication   Anxiety / Insomnia Mild improvement  - pt reports sleep has improved somewhat since last month increasing fluoxetine to 30 mg (1.5 tablets) and melatonin to 10 mg (2 tablets). Pt is not sure if he is taking lorazepam or not. Per chart it was refilled to his pharmacy 08/02/20. Current regimen:  ? Fluoxetine 20 mg - 1.5 tab daily (30 mg) ? Melatonin 5 mg - 2 tab HS (10 mg) ? Lorazepam 0.5 mg HS prn Past tried/failed medications: bupropion, sertraline, Belsomra, trazodone, duloxetine, buspar, ambien, sonata Interventions: ? Affirmed patient is taking 1.5 tablets of fluoxetine and 2 tablets of melatonin ? Discussed option to further increase fluoxetine to 40 mg, pt agreed  to continue current dose for another month since some improvement was seen ? Discussed benefits/risks associated with lorazepam; pt has had falls in the past due to  polypharmacy/sleep medications, so advised him to use this sparingly and at bedtime only ? Discussed benefits of psychotherapy - pt has not contacted therapist yet ? Recommend to continue current medication (may consider increasing fluoxetine to 40 mg at follow up)  Patient Goals/Self-Care Activities . Patient will:  - take medications as prescribed -focus on medication adherence by pill box (medication cups) -Consider contacting psychotherapist to begin cognitive-behavioral therapist -Focus on good sleep hygiene to improve sleep (avoid screens within 30 min of bedtime, avoid fluids/caffeine within 4 hours of bedtime)  Follow Up Plan: Telephone follow up appointment with care management team member scheduled for: 1 month     Patient verbalizes understanding of instructions provided today and agrees to view in [place].  Telephone follow up appointment with pharmacy team member scheduled for: 1 month  Charlene Brooke, PharmD, [facility] Clinical Pharmacist [facility] [phone]

## 2020-08-29 NOTE — Progress Notes (Signed)
Chronic Care Management Pharmacy Note  08/29/2020 Name:  Jonathan [name] MRN:  [id number] DOB:  [birth year]  Subjective: Jonathan [name] is an 87 y.o. year old male who is a primary patient of Burns, Jonathan Lick, MD.  The CCM team was consulted for assistance with disease management and care coordination needs.    Engaged with patient by telephone for follow up visit in response to provider referral for pharmacy case management and/or care coordination services.   Consent to Services:  The patient was given information about Chronic Care Management services, agreed to services, and gave verbal consent prior to initiation of services.  Please see initial visit note for detailed documentation.   Patient Care Team: Jonathan Rail, MD as PCP - General (Internal Medicine) Jonathan [name] Jonathan Bors, MD as PCP - Cardiology (Cardiology) Jonathan Mechanic, MD as Referring Physician (Family Medicine) Jonathan Larsson, MD as Consulting Physician (Neurosurgery) Jonathan [name] Jonathan Bors, MD as Consulting Physician (Cardiology) Jonathan Boston, MD as Consulting Physician (General Surgery) Jonathan Kays, NP as Nurse Practitioner (Nurse Practitioner) Jonathan [name], [facility] as Pharmacist (Pharmacist)  Recent office visits: 06/18/19 Dr Jonathan [name] OV: c/o poor sleep, not currently taking medication. Retry melatonin. Probable gout flare, restart allopurinol 300 mg. Taper off duloxetine and start fluoxetine 20 mg daily. Consider therapist.  06/12/20 Dr Jonathan [name] OV: pt c/o insomnia, panic attacks, anxiety, sinusitis. Reduce Cymbalta to 1 daily. Discontinue Buspar. Rx low-dose lorazepam at night, stop alcohol. Rx'd ceftin.  05/24/20 pt message: panic attacks, rx'd buspar TID 04/18/20 Dr Jonathan [name] OV: chronic f/u. Stay off allopurinol. Restart Eliquis and f/u with cardiology.  03/25/20 Dr Jonathan [name] OV: hospital f/u. Recurrent falls - related to Ambien, will avoid sleep meds, taking melatonin only. Referred,  to PT for balance. Denies GERD,  will taper off omeprazole. D/C allopurinol for now, check uric acid next visit. Increase metformin to BID.  Recent consult visits: 07/02/20 Dr Jonathan [name] (cardiology): f/u Afib, continue Eliquis for now. If he has future falls will discontinue.  03/21/20 Jonathan [name] (cardiology): hospital f/u. Holding Eliquis, may restart soon given hx stroke off of Eliquis in 2019. Considering ARB for BP.  Hospital visits: 03/14/20 - 03/15/20 hospital admission: intracranial hemorrhage s/p multiple falls. [name]. Hold Eliquis (hx of TIA on previous holiday) and counseled on fall prevention (avoidance of Ambien, oxycodone, alcohol). Stopped Ambien and Sonata, rx'd trazodone.  Objective:  Lab Results  Component Value Date   CREATININE 1.16 06/12/2020   BUN 22 06/12/2020   GFR 57.63 (L) 06/12/2020   GFRNONAA >60 03/14/2020   GFRAA >60 03/14/2020   NA 138 06/12/2020   K 3.6 06/12/2020   CALCIUM 9.6 06/12/2020   CO2 27 06/12/2020    Lab Results  Component Value Date/Time   HGBA1C 7.5 (H) 06/12/2020 11:31 AM   HGBA1C 8.1 (H) 03/14/2020 09:45 PM   GFR 57.63 (L) 06/12/2020 11:31 AM   GFR 68.75 09/18/2019 09:40 AM   MICROALBUR 28.1 (H) 08/11/2017 08:48 AM   MICROALBUR 21.2 (H) 12/08/2016 11:43 AM    Last diabetic Eye exam:  Lab Results  Component Value Date/Time   HMDIABEYEEXA No Retinopathy 02/01/2019 12:00 AM    Last diabetic Foot exam: No results found for: HMDIABFOOTEX   Lab Results  Component Value Date   CHOL 111 08/14/2019   HDL 37.60 (L) 08/14/2019   LDLCALC 66 11/11/2018   LDLDIRECT 51.0 08/14/2019   TRIG 211.0 (H) 08/14/2019   CHOLHDL 3 08/14/2019    Hepatic Function Latest Ref Rng &  Units 06/12/2020 03/14/2020 09/18/2019  Total Protein 6.0 - 8.3 g/dL 6.7 6.7 6.7  Albumin 3.5 - 5.2 g/dL 4.6 4.5 4.5  AST 0 - 37 U/L _0 ALT 0 - 53 U/L _1 Alk Phosphatase 39 - 117 U/L 102 88 91  Total Bilirubin 0.2 - 1.2 mg/dL 0.5 0.4 0.5    Lab Results  Component Value  Date/Time   TSH 2.58 06/12/2020 11:31 AM   TSH 3.13 08/14/2019 10:28 AM    CBC Latest Ref Rng & Units 06/12/2020 03/15/2020 03/14/2020  WBC 4.0 - 10.5 K/uL 6.6 13.3(H) 17.0(H)  Hemoglobin 13.0 - 17.0 g/dL 13.4 12.5(L) 13.3  Hematocrit 39.0 - 52.0 % 40.6 37.3(L) 41.3  Platelets 150.0 - 400.0 K/uL 120.0(L) 126(L) 125(L)    Lab Results  Component Value Date/Time   VD25OH 90.58 06/12/2020 11:31 AM   VD25OH 41 11/23/2012 10:52 AM    Clinical ASCVD: Yes  The ASCVD Risk score Mikey Bussing DC Jr., et al., 2013) failed to calculate for the following reasons:   The 2013 ASCVD risk score is only valid for ages 60 to 36    Depression screen PHQ 2/9 06/12/2020 04/26/2020 08/14/2019  Decreased Interest 1 0 2  Down, Depressed, Hopeless 2 0 2  PHQ - 2 Score 3 0 4  Altered sleeping 3 - 3  Tired, decreased energy 0 - 2  Change in appetite 0 - 2  Feeling bad or failure about yourself  0 - 2  Trouble concentrating 0 - 3  Moving slowly or fidgety/restless 0 - 0  Suicidal thoughts 0 - 0  PHQ-9 Score 6 - 16  Difficult doing work/chores - - -  Some recent data might be hidden     CHA2DS2-VASc Score = 6  The patient's score is based upon: CHF History: No HTN History: Yes Diabetes History: Yes Stroke History: Yes Vascular Disease History: No Age Score: 2 Gender Score: 0     Social History   Tobacco Use  Smoking Status Former Smoker  . Packs/day: 2.00  . Years: 10.00  . Pack years: 20.00  . Types: Cigarettes  . Quit date: 07/18/1975  . Years since quitting: 45.1  Smokeless Tobacco Never Used   BP Readings from Last 3 Encounters:  06/18/20 (!) 142/78  06/17/20 (!) 142/68  06/12/20 140/68   Pulse Readings from Last 3 Encounters:  06/17/20 (!) 58  06/12/20 (!) 57  06/11/20 84   Wt Readings from Last 3 Encounters:  07/02/20 196 lb (88.9 kg)  06/17/20 198 lb 3.2 oz (89.9 kg)  06/12/20 196 lb (88.9 kg)    Assessment/Interventions: Review of patient past medical history, allergies,  medications, health status, including review of consultants reports, laboratory and other test data, was performed as part of comprehensive evaluation and provision of chronic care management services.   SDOH:  (Social Determinants of Health) assessments and interventions performed: Yes   CCM Care Plan  Allergies  Allergen Reactions  . Other Other (See Comments)    DUST-Other reaction(s): Respiratory distress    Medications Reviewed Today    Reviewed by Jonathan [name], [facility] (Pharmacist) on 08/29/20 at 1024  Med List Status: <None>  Medication Order Taking? Sig Documenting Provider Last Dose Status Informant  acetaminophen (TYLENOL) 650 MG CR tablet [id number] Yes Take 650 mg by mouth in the morning and at bedtime. Pt takes 2 tabs 2 times daily [provider] Taking Active   allopurinol (ZYLOPRIM) 300 MG tablet [id number] Yes  Take 1 tablet (300 mg total) by mouth every evening. For gout Jonathan Rail, MD Taking Active   apixaban (ELIQUIS) 5 MG TABS tablet [id number] Yes Take 1 tablet (5 mg total) by mouth 2 (two) times daily. Lelon Perla, MD Taking Active   atorvastatin (LIPITOR) 20 MG tablet [id number] Yes Take 1 tablet (20 mg total) by mouth daily. For cholesterol Jonathan Rail, MD Taking Active   bimatoprost (LUMIGAN) 0.01 % SOLN [id number] Yes Place 1 drop into both eyes at bedtime.  [provider] Taking Active Self  Biotin 5000 MCG TABS [id number] Yes Take 5,000 mcg by mouth daily. [provider] Taking Active Self  calcium-vitamin D (OSCAL WITH D) 500-200 MG-UNIT tablet [id number] Yes Take 1 tablet by mouth daily with breakfast. [provider] Taking Active Self  colchicine 0.6 MG tablet [id number] Yes Take 0.6 mg by mouth daily as needed (flareups).  [provider] Taking Active Self  diltiazem (CARDIZEM CD) 300 MG 24 hr capsule [id number] Yes TAKE ONE CAPSULE BY MOUTH DAILY Crenshaw, Jonathan Bors, MD Taking Active   diltiazem  (CARDIZEM) 30 MG tablet [id number] Yes Take 1 tablet (30 mg total) by mouth 2 (two) times daily as needed (sustained elevated heart rates >100 bpm for > 20 minutes.). Darreld Mclean, Jonathan-C Taking Active   FLUoxetine (PROZAC) 20 MG tablet [id number] Yes Take 1 tablet (20 mg total) by mouth daily. Start after you are off the cymbalta  Patient taking differently: Take 30 mg by mouth daily. Start after you are off the cymbalta   Jonathan Rail, MD Taking Active   glucose blood test strip [id number] Yes Use to check blood sugar daily. E11.9 Jonathan Rail, MD Taking Active Self  hydrochlorothiazide (HYDRODIURIL) 25 MG tablet [id number] Yes Take 1 tablet (25 mg total) by mouth daily. For blood pressure Jonathan Rail, MD Taking Active   Lancets MISC [id number] Yes Use to check blood sugars daily. E11.9 Jonathan Rail, MD Taking Active Self  levothyroxine (SYNTHROID) 112 MCG tablet [id number] Yes Take 1 tablet (112 mcg total) by mouth daily before breakfast. For thyroid Jonathan Rail, MD Taking Active   LORazepam (ATIVAN) 0.5 MG tablet [id number] Yes TAKE ONE TABLET BY MOUTH TWICE A DAY AS NEEDED FOR ANXIETY Plotnikov, Evie Lacks, MD Taking Active   magnesium oxide (MAG-OX) 400 MG tablet [id number] Yes Take 400 mg by mouth daily. [provider] Taking Active Self           Med Note Spero Curb, GREG A   Sat Mar 07, 2016 11:01 PM)    Melatonin 5 MG TABS [id number] Yes Take 10 mg by mouth at bedtime. [provider] Taking Active Self  metFORMIN (GLUCOPHAGE-XR) 750 MG 24 hr tablet [id number] Yes Take 1 tablet (750 mg total) by mouth in the morning and at bedtime. For diabetes Jonathan Rail, MD Taking Active   mometasone (NASONEX) 50 MCG/ACT nasal spray [id number] Yes Place 2 sprays into the nose daily.  Patient taking differently: Place 2 sprays into the nose as needed (allergies).   Jonathan Rail, MD Taking Active   Multiple Vitamin (MULTIVITAMIN) tablet [id number] Yes Take 1 tablet by mouth daily.  [provider] Taking Active Self  nitroGLYCERIN (NITROSTAT) 0.4 MG SL tablet [id number] Yes Place 1 tablet (0.4 mg total) under the tongue every 5 (five) minutes as needed for chest pain. Barrett, Evelene Croon, Jonathan-C Taking Active Self  omeprazole (PRILOSEC) 20 MG capsule [id number] Yes TAKE 1  CAPSULE BY MOUTH EVERY DAY.  For heartburn Jonathan Rail, MD Taking Active   potassium chloride SA (KLOR-CON M20) 20 MEQ tablet [id number] Yes Take 1 tablet (20 mEq total) by mouth daily. For low potassium Jonathan Rail, MD Taking Active   RESTASIS 0.05 % ophthalmic emulsion [id number] Yes Place 1 drop into both eyes 2 (two) times daily.  [provider] Taking Active Self  sildenafil (REVATIO) 20 MG tablet [id number] Yes Take 40-100 mg by mouth daily as needed. [provider] Taking Active Self  solifenacin (VESICARE) 10 MG tablet [id number] Yes Take 10 mg by mouth daily. [provider] Taking Active   tamsulosin (FLOMAX) 0.4 MG CAPS capsule [id number] Yes Take 0.4 mg by mouth daily. [provider] Taking Active   vitamin C (ASCORBIC ACID) 500 MG tablet [id number] Yes Take 500 mg by mouth 2 (two) times daily.  [provider] Taking Active Self  VITAMIN D, CHOLECALCIFEROL, PO [id number] Yes Take 25 mcg by mouth daily.  [provider] Taking Active Self          Patient Active Problem List   Diagnosis Date Noted  . Acute sinusitis 06/12/2020  . Anxiety 06/12/2020  . Recurrent falls 03/25/2020  . SDH (subdural hematoma) ([place]) 03/21/2020  . Intracranial hemorrhage ([place]) 03/14/2020  . Fall at home, subsequent encounter 03/14/2020  . Leukocytosis 03/14/2020  . Nausea 09/13/2019  . Rotator cuff arthropathy of left shoulder 07/21/2019  . Dupuytren's disease of palm 07/12/2019  . Sebaceous cyst 06/27/2019  . COVID-19 05/08/2019  . Acute encephalopathy 05/08/2019  . Obstructive sleep apnea   . Asymmetrical left sensorineural hearing loss 07/12/2018   . Chronic anticoagulation 12/21/2017  . Compression fracture of thoracic vertebra ([place]) 09/21/2017  . Degeneration of thoracic intervertebral disc 09/21/2017  . Thoracic back pain 08/10/2017  . Degenerative joint disease of hand 07/14/2017  . Osteoarthritis   . Hypoxia   . Fibromyalgia   . Fatty tumor   . Cataract   . BPH (benign prostatic hyperplasia)   . Chronic atrial fibrillation ([place])   . Insomnia 03/10/2017  . Chronic pain, legs and back 03/10/2017  . Hyperlipidemia 03/10/2017  . Obesity (BMI 30.0-34.9) 12/08/2016  . Depression 09/07/2016  . Spondylolisthesis 09/07/2016  . Hypothyroidism 03/08/2016  . GERD (gastroesophageal reflux disease) 03/08/2016  . Gout 03/08/2016  . Glaucoma 03/08/2016  . Type 2 diabetes mellitus ([place]) 03/08/2016  . Hereditary and idiopathic peripheral neuropathy 08/28/2015  . Essential hypertension 12/04/2011  . Allergic rhinitis, seasonal 12/04/2011  . COPD (chronic obstructive pulmonary disease) ([place]) 07/18/2011    Immunization History  Administered Date(s) Administered  . Fluad Quad(high Dose 65+) 03/25/2020  . Hepatitis A 07/25/2007, 01/22/2008  . Hepatitis B 04/30/1988, 06/02/1988, 10/28/1988  . IPV 05/15/1996  . Influenza Split 02/19/2012, 03/13/2016  . Influenza Whole 02/17/2011, 03/13/2013  . Influenza, High Dose Seasonal PF 03/10/2017, 02/15/2018, 02/09/2019  . Influenza,inj,Quad PF,6+ Mos 03/15/2015  . Influenza,inj,quad, With Preservative 02/14/2019  . Meningococcal Polysaccharide 05/15/1996  . Moderna Sars-Covid-2 Vaccination 07/15/2019, 08/12/2019, 02/15/2020  . Pneumococcal Conjugate-13 12/08/2016  . Pneumococcal-Unspecified 02/01/2006  . Td 01/14/1995  . Tdap 03/06/2018  . Zoster 02/01/2006  . Zoster Recombinat (Shingrix) 01/15/2017, 01/04/2018    Conditions to be addressed/monitored:  Hypertension, Hyperlipidemia, Diabetes, Anxiety, Overactive Bladder, BPH and Gout, Insomnia  Care Plan : [place]   Updates made by Jonathan [name], [place] since 08/29/2020 12:00 AM    Problem: Hypertension, Hyperlipidemia, Diabetes, Anxiety, Overactive Bladder, BPH and  Gout, Insomnia   Priority: High    Long-Range Goal: Disease management   Start Date: 07/31/2020  Expected End Date: 01/28/2021  This Visit's Progress: On track  Recent Progress: On track  Priority: High  Note:   Current Barriers:  . Unable to independently monitor therapeutic efficacy . Unable to maintain control of insomnia/mood  Pharmacist Clinical Goal(s):  Marland Kitchen Patient will achieve adherence to monitoring guidelines and medication adherence to achieve therapeutic efficacy . maintain control of insomnia/mood as evidenced by patient report  through collaboration with PharmD and provider.   Interventions: . 1:1 collaboration with Jonathan Rail, MD regarding development and update of comprehensive plan of care as evidenced by provider attestation and co-signature . Inter-disciplinary care team collaboration (see longitudinal plan of care) . Comprehensive medication review performed; medication list updated in electronic medical record  Hypertension / AFIB (BP goal < 140/90) Controlled - BP at goal/close to goal in recent office visits CHADSVASC = 6. Hx embolic stroke off of Eliquis in 2019. Hx SDH after fall on Eliquis (last 03/2020).  Current regimen:  ? HCTZ 25 mg daily ? Diltiazem CD 300 mg daily ? Diltiazem IR 30 mg PRN HR > 100 ? Eliquis 5 mg BID Interventions: ? Discussed BP goals and benefits of medications for prevention of heart attack / stroke ? Recommend to continue current medication   Hyperlipidemia (LDL goal < 70) Controlled Current regimen:  ? Atorvastatin 20 mg daily ? Nitroglycerin 0.4 mg SL prn Interventions: ? Discussed cholesterol goals and benefits of medications for prevention of heart attack / stroke ? Recommend to continue current medication   Diabetes (A1c goal < 8%) Controlled Current  regimen:  ? Metformin ER 750 mg twice a day ? Testing supplies Interventions: ? Discussed blood sugar goals and benefits of medications for prevention of diabetic complications ? Recommend to continue checking blood sugar before breakfast and keeping log of values ? Recommend to continue current medication   Gout Controlled Current regimen:  ? Allopurinol 300 mg daily ? Colchicine 0.6 mg as needed for flares Interventions: ? Benefits of allopurinol for prevention of gout flares; pt did trial off allopurinol and had a gout flare, so medication was resumed ? Discussed low purine diet - handout provided previously ? Recommend to continue current medication   Overactive bladder / BPH Improved - pt notes improvement in symptoms after he re-started solifenacin Current regimen:  ? Solifenancin 10 mg daily ? Tamsulosin 0.4 mg daily Interventions: ? Affirmed that patient has stopped doxazosin when tamsulosin was prescribed (per urologist) ? Recommend to continue current medication   Anxiety / Insomnia Mild improvement  - pt reports sleep has improved somewhat since last month increasing fluoxetine to 30 mg (1.5 tablets) and melatonin to 10 mg (2 tablets). Pt is not sure if he is taking lorazepam or not. Per chart it was refilled to his pharmacy 08/02/20. Current regimen:  ? Fluoxetine 20 mg - 1.5 tab daily (30 mg) ? Melatonin 5 mg - 2 tab HS (10 mg) ? Lorazepam 0.5 mg HS prn Past tried/failed medications: bupropion, sertraline, Belsomra, trazodone, duloxetine, buspar, ambien, sonata Interventions: ? Affirmed patient is taking 1.5 tablets of fluoxetine and 2 tablets of melatonin ? Discussed option to further increase fluoxetine to 40 mg, pt agreed to continue current dose for another month since some improvement was seen ? Discussed benefits/risks associated with lorazepam; pt has had falls in the past due to polypharmacy/sleep medications, so advised him to use this sparingly and  at  bedtime only ? Discussed benefits of psychotherapy - pt has not contacted therapist yet ? Recommend to continue current medication (may consider increasing fluoxetine to 40 mg at follow up)  Patient Goals/Self-Care Activities . Patient will:  - take medications as prescribed -focus on medication adherence by pill box (medication cups) -Consider contacting psychotherapist to begin cognitive-behavioral therapist -Focus on good sleep hygiene to improve sleep (avoid screens within 30 min of bedtime, avoid fluids/caffeine within 4 hours of bedtime)  Follow Up Plan: Telephone follow up appointment with care management team member scheduled for: 1 month     Medication Assistance: None required.  Patient affirms current coverage meets needs.  Patient's preferred pharmacy is:  [facility] [address] [name] - [name] [address] Phone: [phone] Fax: [phone]  [id number]: [phone]: [phone] Uses pill box? Yes Pt endorses 90% compliance  We discussed: Current pharmacy is preferred with insurance plan and patient is satisfied with pharmacy services Patient decided to: Continue current medication management strategy  Care Plan and Follow Up Patient Decision:  Patient agrees to Care Plan and Follow-up.  Plan: Telephone follow up appointment with care management team member scheduled for:  1 month  LCharlene Brooke PharmD, BSt. Elizabeth Ft. ThomasClinical Pharmacist LCoahomaPrimary Care at [phone]

## 2020-09-16 ENCOUNTER — Encounter: Payer: Self-pay | Admitting: Internal Medicine

## 2020-09-16 MED ORDER — FLUOXETINE HCL 20 MG PO TABS: 30.0000 mg | ORAL_TABLET | Freq: Every day | ORAL | 1 refills | Status: DC

## 2020-09-26 DIAGNOSIS — Z961 Presence of intraocular lens: Secondary | ICD-10-CM | POA: Diagnosis not present

## 2020-09-26 DIAGNOSIS — H524 Presbyopia: Secondary | ICD-10-CM | POA: Diagnosis not present

## 2020-09-26 DIAGNOSIS — H04123 Dry eye syndrome of bilateral lacrimal glands: Secondary | ICD-10-CM | POA: Diagnosis not present

## 2020-09-26 DIAGNOSIS — H401132 Primary open-angle glaucoma, bilateral, moderate stage: Secondary | ICD-10-CM | POA: Diagnosis not present

## 2020-09-30 ENCOUNTER — Telehealth: Payer: Medicare Other

## 2020-10-07 ENCOUNTER — Telehealth: Payer: Medicare Other

## 2020-10-07 NOTE — Progress Notes (Deleted)
Chronic Care Management Pharmacy Note  10/07/2020 Name:  Kierre Hintz MRN:  [id number] DOB:  1932-08-18  Subjective: Jonathan [name] is an 87 y.o. year old male who is a primary patient of Burns, Claudina Lick, MD.  The CCM team was consulted for assistance with disease management and care coordination needs.    Engaged with patient by telephone for follow up visit in response to provider referral for pharmacy case management and/or care coordination services.   Consent to Services:  The patient was given information about Chronic Care Management services, agreed to services, and gave verbal consent prior to initiation of services.  Please see initial visit note for detailed documentation.   Patient Care Team: Binnie Rail, MD as PCP - General (Internal Medicine) Stanford Breed Denice Bors, MD as PCP - Cardiology (Cardiology) Council Mechanic, MD as Referring Physician (Family Medicine) Earnie Larsson, MD as Consulting Physician (Neurosurgery) Stanford Breed Denice Bors, MD as Consulting Physician (Cardiology) Michael Boston, MD as Consulting Physician (General Surgery) Karen Kays, NP as Nurse Practitioner (Nurse Practitioner) [name], [facility] as Pharmacist (Pharmacist)  Recent office visits: 06/18/19 Dr Quay Burow OV: c/o poor sleep, not currently taking medication. Retry melatonin. Probable gout flare, restart allopurinol 300 mg. Taper off duloxetine and start fluoxetine 20 mg daily. Consider therapist.  06/12/20 Dr Alain Marion OV: pt c/o insomnia, panic attacks, anxiety, sinusitis. Reduce Cymbalta to 1 daily. Discontinue Buspar. Rx low-dose lorazepam at night, stop alcohol. Rx'd ceftin.  05/24/20 pt message: panic attacks, rx'd buspar TID 04/18/20 Dr Quay Burow OV: chronic f/u. Stay off allopurinol. Restart Eliquis and f/u with cardiology.  03/25/20 Dr Quay Burow OV: hospital f/u. Recurrent falls - related to Ambien, will avoid sleep meds, taking melatonin only. Referred,  to PT for balance. Denies GERD,  will taper off omeprazole. D/C allopurinol for now, check uric acid next visit. Increase metformin to BID.  Recent consult visits: 07/02/20 Dr Stanford Breed (cardiology): f/u Afib, continue Eliquis for now. If he has future falls will discontinue.  03/21/20 PA Kerin Ransom (cardiology): hospital f/u. Holding Eliquis, may restart soon given hx stroke off of Eliquis in 2019. Considering ARB for BP.  Hospital visits: 03/14/20 - 03/15/20 hospital admission: intracranial hemorrhage s/p multiple falls. [name]. Hold Eliquis (hx of TIA on previous holiday) and counseled on fall prevention (avoidance of Ambien, oxycodone, alcohol). Stopped Ambien and Sonata, rx'd trazodone.  Objective:  Lab Results  Component Value Date   CREATININE 1.16 06/12/2020   BUN 22 06/12/2020   GFR 57.63 (L) 06/12/2020   GFRNONAA >60 03/14/2020   GFRAA >60 03/14/2020   NA 138 06/12/2020   K 3.6 06/12/2020   CALCIUM 9.6 06/12/2020   CO2 27 06/12/2020    Lab Results  Component Value Date/Time   HGBA1C 7.5 (H) 06/12/2020 11:31 AM   HGBA1C 8.1 (H) 03/14/2020 09:45 PM   GFR 57.63 (L) 06/12/2020 11:31 AM   GFR 68.75 09/18/2019 09:40 AM   MICROALBUR 28.1 (H) 08/11/2017 08:48 AM   MICROALBUR 21.2 (H) 12/08/2016 11:43 AM    Last diabetic Eye exam:  Lab Results  Component Value Date/Time   HMDIABEYEEXA No Retinopathy 02/01/2019 12:00 AM    Last diabetic Foot exam: No results found for: HMDIABFOOTEX   Lab Results  Component Value Date   CHOL 111 08/14/2019   HDL 37.60 (L) 08/14/2019   LDLCALC 66 11/11/2018   LDLDIRECT 51.0 08/14/2019   TRIG 211.0 (H) 08/14/2019   CHOLHDL 3 08/14/2019    Hepatic Function Latest Ref Rng &  Units 06/12/2020 03/14/2020 09/18/2019  Total Protein 6.0 - 8.3 g/dL 6.7 6.7 6.7  Albumin 3.5 - 5.2 g/dL 4.6 4.5 4.5  AST 0 - 37 U/L $Remo'24 24 13  'xRSqO$ ALT 0 - 53 U/L $Remo'29 21 18  'Trcet$ Alk Phosphatase 39 - 117 U/L 102 88 91  Total Bilirubin 0.2 - 1.2 mg/dL 0.5 0.4 0.5    Lab Results  Component Value  Date/Time   TSH 2.58 06/12/2020 11:31 AM   TSH 3.13 08/14/2019 10:28 AM    CBC Latest Ref Rng & Units 06/12/2020 03/15/2020 03/14/2020  WBC 4.0 - 10.5 K/uL 6.6 13.3(H) 17.0(H)  Hemoglobin 13.0 - 17.0 g/dL 13.4 12.5(L) 13.3  Hematocrit 39.0 - 52.0 % 40.6 37.3(L) 41.3  Platelets 150.0 - 400.0 K/uL 120.0(L) 126(L) 125(L)    Lab Results  Component Value Date/Time   VD25OH 90.58 06/12/2020 11:31 AM   VD25OH 41 11/23/2012 10:52 AM    Clinical ASCVD: Yes  The ASCVD Risk score Mikey Bussing DC Jr., et al., 2013) failed to calculate for the following reasons:   The 2013 ASCVD risk score is only valid for ages 2 to 18    Depression screen PHQ 2/9 06/12/2020 04/26/2020 08/14/2019  Decreased Interest 1 0 2  Down, Depressed, Hopeless 2 0 2  PHQ - 2 Score 3 0 4  Altered sleeping 3 - 3  Tired, decreased energy 0 - 2  Change in appetite 0 - 2  Feeling bad or failure about yourself  0 - 2  Trouble concentrating 0 - 3  Moving slowly or fidgety/restless 0 - 0  Suicidal thoughts 0 - 0  PHQ-9 Score 6 - 16  Difficult doing work/chores - - -  Some recent data might be hidden     CHA2DS2-VASc Score = 6  The patient's score is based upon: CHF History: No HTN History: Yes Diabetes History: Yes Stroke History: Yes Vascular Disease History: No Age Score: 2 Gender Score: 0     Social History   Tobacco Use  Smoking Status Former Smoker  . Packs/day: 2.00  . Years: 10.00  . Pack years: 20.00  . Types: Cigarettes  . Quit date: 07/18/1975  . Years since quitting: 45.2  Smokeless Tobacco Never Used   BP Readings from Last 3 Encounters:  06/18/20 (!) 142/78  06/17/20 (!) 142/68  06/12/20 140/68   Pulse Readings from Last 3 Encounters:  06/17/20 (!) 58  06/12/20 (!) 57  06/11/20 84   Wt Readings from Last 3 Encounters:  07/02/20 196 lb (88.9 kg)  06/17/20 198 lb 3.2 oz (89.9 kg)  06/12/20 196 lb (88.9 kg)    Assessment/Interventions: Review of patient past medical history, allergies,  medications, health status, including review of consultants reports, laboratory and other test data, was performed as part of comprehensive evaluation and provision of chronic care management services.   SDOH:  (Social Determinants of Health) assessments and interventions performed: Yes   CCM Care Plan  Allergies  Allergen Reactions  . Other Other (See Comments)    DUST-Other reaction(s): Respiratory distress    Medications Reviewed Today    Reviewed by [name], [facility] (Pharmacist) on 08/29/20 at 1024  Med List Status: <None>  Medication Order Taking? Sig Documenting Provider Last Dose Status Informant  acetaminophen (TYLENOL) 650 MG CR tablet [id number] Yes Take 650 mg by mouth in the morning and at bedtime. Pt takes 2 tabs 2 times daily [provider] Taking Active   allopurinol (ZYLOPRIM) 300 MG tablet [id number] Yes  Take 1 tablet (300 mg total) by mouth every evening. For gout Binnie Rail, MD Taking Active   apixaban (ELIQUIS) 5 MG TABS tablet [id number] Yes Take 1 tablet (5 mg total) by mouth 2 (two) times daily. Lelon Perla, MD Taking Active   atorvastatin (LIPITOR) 20 MG tablet [id number] Yes Take 1 tablet (20 mg total) by mouth daily. For cholesterol Binnie Rail, MD Taking Active   bimatoprost (LUMIGAN) 0.01 % SOLN [id number] Yes Place 1 drop into both eyes at bedtime.  [provider] Taking Active Self  Biotin 5000 MCG TABS [id number] Yes Take 5,000 mcg by mouth daily. [provider] Taking Active Self  calcium-vitamin D (OSCAL WITH D) 500-200 MG-UNIT tablet [id number] Yes Take 1 tablet by mouth daily with breakfast. [provider] Taking Active Self  colchicine 0.6 MG tablet [id number] Yes Take 0.6 mg by mouth daily as needed (flareups).  [provider] Taking Active Self  diltiazem (CARDIZEM CD) 300 MG 24 hr capsule [id number] Yes TAKE ONE CAPSULE BY MOUTH DAILY Crenshaw, Denice Bors, MD Taking Active   diltiazem  (CARDIZEM) 30 MG tablet [id number] Yes Take 1 tablet (30 mg total) by mouth 2 (two) times daily as needed (sustained elevated heart rates >100 bpm for > 20 minutes.). Darreld Mclean, PA-C Taking Active   FLUoxetine (PROZAC) 20 MG tablet [id number] Yes Take 1 tablet (20 mg total) by mouth daily. Start after you are off the cymbalta  Patient taking differently: Take 30 mg by mouth daily. Start after you are off the cymbalta   Binnie Rail, MD Taking Active   glucose blood test strip [id number] Yes Use to check blood sugar daily. E11.9 Binnie Rail, MD Taking Active Self  hydrochlorothiazide (HYDRODIURIL) 25 MG tablet [id number] Yes Take 1 tablet (25 mg total) by mouth daily. For blood pressure Binnie Rail, MD Taking Active   Lancets MISC [id number] Yes Use to check blood sugars daily. E11.9 Binnie Rail, MD Taking Active Self  levothyroxine (SYNTHROID) 112 MCG tablet [id number] Yes Take 1 tablet (112 mcg total) by mouth daily before breakfast. For thyroid Binnie Rail, MD Taking Active   LORazepam (ATIVAN) 0.5 MG tablet [id number] Yes TAKE ONE TABLET BY MOUTH TWICE A DAY AS NEEDED FOR ANXIETY Plotnikov, Evie Lacks, MD Taking Active   magnesium oxide (MAG-OX) 400 MG tablet [id number] Yes Take 400 mg by mouth daily. [provider] Taking Active Self           Med Note Spero Curb, GREG A   Sat Mar 07, 2016 11:01 PM)    Melatonin 5 MG TABS [id number] Yes Take 10 mg by mouth at bedtime. [provider] Taking Active Self  metFORMIN (GLUCOPHAGE-XR) 750 MG 24 hr tablet [id number] Yes Take 1 tablet (750 mg total) by mouth in the morning and at bedtime. For diabetes Binnie Rail, MD Taking Active   mometasone (NASONEX) 50 MCG/ACT nasal spray [id number] Yes Place 2 sprays into the nose daily.  Patient taking differently: Place 2 sprays into the nose as needed (allergies).   Binnie Rail, MD Taking Active   Multiple Vitamin (MULTIVITAMIN) tablet [id number] Yes Take 1 tablet by mouth daily.  [provider] Taking Active Self  nitroGLYCERIN (NITROSTAT) 0.4 MG SL tablet [id number] Yes Place 1 tablet (0.4 mg total) under the tongue every 5 (five) minutes as needed for chest pain. Barrett, Evelene Croon, PA-C Taking Active Self  omeprazole (PRILOSEC) 20 MG capsule [id number] Yes TAKE 1  CAPSULE BY MOUTH EVERY DAY.  For heartburn Binnie Rail, MD Taking Active   potassium chloride SA (KLOR-CON M20) 20 MEQ tablet [id number] Yes Take 1 tablet (20 mEq total) by mouth daily. For low potassium Binnie Rail, MD Taking Active   RESTASIS 0.05 % ophthalmic emulsion [id number] Yes Place 1 drop into both eyes 2 (two) times daily.  [provider] Taking Active Self  sildenafil (REVATIO) 20 MG tablet [id number] Yes Take 40-100 mg by mouth daily as needed. [provider] Taking Active Self  solifenacin (VESICARE) 10 MG tablet [id number] Yes Take 10 mg by mouth daily. [provider] Taking Active   tamsulosin (FLOMAX) 0.4 MG CAPS capsule [id number] Yes Take 0.4 mg by mouth daily. [provider] Taking Active   vitamin C (ASCORBIC ACID) 500 MG tablet [id number] Yes Take 500 mg by mouth 2 (two) times daily.  [provider] Taking Active Self  VITAMIN D, CHOLECALCIFEROL, PO [id number] Yes Take 25 mcg by mouth daily.  [provider] Taking Active Self          Patient Active Problem List   Diagnosis Date Noted  . Acute sinusitis 06/12/2020  . Anxiety 06/12/2020  . Recurrent falls 03/25/2020  . SDH (subdural hematoma) ([place]) 03/21/2020  . Intracranial hemorrhage ([place]) 03/14/2020  . Fall at home, subsequent encounter 03/14/2020  . Leukocytosis 03/14/2020  . Nausea 09/13/2019  . Rotator cuff arthropathy of left shoulder 07/21/2019  . Dupuytren's disease of palm 07/12/2019  . Sebaceous cyst 06/27/2019  . COVID-19 05/08/2019  . Acute encephalopathy 05/08/2019  . Obstructive sleep apnea   . Asymmetrical left sensorineural hearing loss 07/12/2018   . Chronic anticoagulation 12/21/2017  . Compression fracture of thoracic vertebra ([place]) 09/21/2017  . Degeneration of thoracic intervertebral disc 09/21/2017  . Thoracic back pain 08/10/2017  . Degenerative joint disease of hand 07/14/2017  . Osteoarthritis   . Hypoxia   . Fibromyalgia   . Fatty tumor   . Cataract   . BPH (benign prostatic hyperplasia)   . Chronic atrial fibrillation ([place])   . Insomnia 03/10/2017  . Chronic pain, legs and back 03/10/2017  . Hyperlipidemia 03/10/2017  . Obesity (BMI 30.0-34.9) 12/08/2016  . Depression 09/07/2016  . Spondylolisthesis 09/07/2016  . Hypothyroidism 03/08/2016  . GERD (gastroesophageal reflux disease) 03/08/2016  . Gout 03/08/2016  . Glaucoma 03/08/2016  . Type 2 diabetes mellitus ([place]) 03/08/2016  . Hereditary and idiopathic peripheral neuropathy 08/28/2015  . Essential hypertension 12/04/2011  . Allergic rhinitis, seasonal 12/04/2011  . COPD (chronic obstructive pulmonary disease) ([place]) 07/18/2011    Immunization History  Administered Date(s) Administered  . Fluad Quad(high Dose 65+) 03/25/2020  . Hepatitis A 07/25/2007, 01/22/2008  . Hepatitis B 04/30/1988, 06/02/1988, 10/28/1988  . IPV 05/15/1996  . Influenza Split 02/19/2012, 03/13/2016  . Influenza Whole 02/17/2011, 03/13/2013  . Influenza, High Dose Seasonal PF 03/10/2017, 02/15/2018, 02/09/2019  . Influenza,inj,Quad PF,6+ Mos 03/15/2015  . Influenza,inj,quad, With Preservative 02/14/2019  . Meningococcal Polysaccharide 05/15/1996  . Moderna Sars-Covid-2 Vaccination 07/15/2019, 08/12/2019, 02/15/2020  . Pneumococcal Conjugate-13 12/08/2016  . Pneumococcal-Unspecified 02/01/2006  . Td 01/14/1995  . Tdap 03/06/2018  . Zoster 02/01/2006  . Zoster Recombinat (Shingrix) 01/15/2017, 01/04/2018    Conditions to be addressed/monitored:  Hypertension, Hyperlipidemia, Diabetes, Anxiety, Overactive Bladder, BPH and Gout, Insomnia  Patient Care Plan: CCM Pharmacy Care Plan     Problem Identified: Hypertension, Hyperlipidemia, Diabetes, Anxiety, Overactive Bladder, BPH and Gout, Insomnia   Priority: High    Long-Range Goal:  Disease management   Start Date: 07/31/2020  Expected End Date: 01/28/2021  This Visit's Progress: On track  Recent Progress: On track  Priority: High  Note:   Current Barriers:  . Unable to independently monitor therapeutic efficacy . Unable to maintain control of insomnia/mood  Pharmacist Clinical Goal(s):  Marland Kitchen Patient will achieve adherence to monitoring guidelines and medication adherence to achieve therapeutic efficacy . maintain control of insomnia/mood as evidenced by patient report  through collaboration with PharmD and provider.   Interventions: . 1:1 collaboration with Binnie Rail, MD regarding development and update of comprehensive plan of care as evidenced by provider attestation and co-signature . Inter-disciplinary care team collaboration (see longitudinal plan of care) . Comprehensive medication review performed; medication list updated in electronic medical record  Hypertension / AFIB (BP goal < 140/90) Controlled - BP at goal/close to goal in recent office visits CHADSVASC = 6. Hx embolic stroke off of Eliquis in 2019. Hx SDH after fall on Eliquis (last 03/2020).  Current regimen:  ? HCTZ 25 mg daily ? Diltiazem CD 300 mg daily ? Diltiazem IR 30 mg PRN HR > 100 ? Eliquis 5 mg BID Interventions: ? Discussed BP goals and benefits of medications for prevention of heart attack / stroke ? Recommend to continue current medication   Hyperlipidemia (LDL goal < 70) Controlled Current regimen:  ? Atorvastatin 20 mg daily ? Nitroglycerin 0.4 mg SL prn Interventions: ? Discussed cholesterol goals and benefits of medications for prevention of heart attack / stroke ? Recommend to continue current medication   Diabetes (A1c goal < 8%) Controlled Current regimen:  ? Metformin ER 750 mg twice a day ? Testing  supplies Interventions: ? Discussed blood sugar goals and benefits of medications for prevention of diabetic complications ? Recommend to continue checking blood sugar before breakfast and keeping log of values ? Recommend to continue current medication   Gout Controlled Current regimen:  ? Allopurinol 300 mg daily ? Colchicine 0.6 mg as needed for flares Interventions: ? Benefits of allopurinol for prevention of gout flares; pt did trial off allopurinol and had a gout flare, so medication was resumed ? Discussed low purine diet - handout provided previously ? Recommend to continue current medication   Overactive bladder / BPH Improved - pt notes improvement in symptoms after he re-started solifenacin Current regimen:  ? Solifenancin 10 mg daily ? Tamsulosin 0.4 mg daily Interventions: ? Affirmed that patient has stopped doxazosin when tamsulosin was prescribed (per urologist) ? Recommend to continue current medication   Anxiety / Insomnia Mild improvement  - pt reports sleep has improved somewhat since last month increasing fluoxetine to 30 mg (1.5 tablets) and melatonin to 10 mg (2 tablets). Pt is not sure if he is taking lorazepam or not. Per chart it was refilled to his pharmacy 08/02/20. Current regimen:  ? Fluoxetine 20 mg - 1.5 tab daily (30 mg) ? Melatonin 5 mg - 2 tab HS (10 mg) ? Lorazepam 0.5 mg HS prn Past tried/failed medications: bupropion, sertraline, Belsomra, trazodone, duloxetine, buspar, ambien, sonata Interventions: ? Affirmed patient is taking 1.5 tablets of fluoxetine and 2 tablets of melatonin ? Discussed option to further increase fluoxetine to 40 mg, pt agreed to continue current dose for another month since some improvement was seen ? Discussed benefits/risks associated with lorazepam; pt has had falls in the past due to polypharmacy/sleep medications, so advised him to use this sparingly and at bedtime only ? Discussed benefits of psychotherapy - pt has  not contacted therapist yet ? Recommend to continue current medication (may consider increasing fluoxetine to 40 mg at follow up)  Patient Goals/Self-Care Activities . Patient will:  - take medications as prescribed -focus on medication adherence by pill box (medication cups) -Consider contacting psychotherapist to begin cognitive-behavioral therapist -Focus on good sleep hygiene to improve sleep (avoid screens within 30 min of bedtime, avoid fluids/caffeine within 4 hours of bedtime)  Follow Up Plan: Telephone follow up appointment with care management team member scheduled for: 1 month     Medication Assistance: None required.  Patient affirms current coverage meets needs.  Patient's preferred pharmacy is:  [facility] [address] [name] [address] Phone: [phone] Fax: [phone]  [id number] [address], [name] [address] Phone: [phone] Fax: [phone]  Uses pill box? Yes Pt endorses 90% compliance  We discussed: Current pharmacy is preferred with insurance plan and patient is satisfied with pharmacy services Patient decided to: Continue current medication management strategy  Care Plan and Follow Up Patient Decision:  Patient agrees to Care Plan and Follow-up.  Plan: Telephone follow up appointment with care management team member scheduled for:  *** month  Charlene Brooke, PharmD, [facility] Clinical Pharmacist [facility] [phone]

## 2020-10-30 DIAGNOSIS — M25511 Pain in right shoulder: Secondary | ICD-10-CM | POA: Diagnosis not present

## 2020-10-30 DIAGNOSIS — M25812 Other specified joint disorders, left shoulder: Secondary | ICD-10-CM | POA: Diagnosis not present

## 2020-11-04 ENCOUNTER — Other Ambulatory Visit: Payer: Self-pay | Admitting: Internal Medicine

## 2020-11-04 NOTE — Telephone Encounter (Signed)
He is almost due for a routine follow-up with me and does not have one scheduled.  I renewed his lorazepam.  Please schedule a follow-up with me for sometime in June.

## 2020-11-05 NOTE — Telephone Encounter (Signed)
Message left for patient to return call to clinic and schedule.

## 2020-11-06 ENCOUNTER — Encounter: Payer: Self-pay | Admitting: Internal Medicine

## 2020-11-06 DIAGNOSIS — H9193 Unspecified hearing loss, bilateral: Secondary | ICD-10-CM

## 2020-11-07 DIAGNOSIS — M13841 Other specified arthritis, right hand: Secondary | ICD-10-CM | POA: Diagnosis not present

## 2020-11-07 DIAGNOSIS — M79642 Pain in left hand: Secondary | ICD-10-CM | POA: Diagnosis not present

## 2020-11-07 DIAGNOSIS — N401 Enlarged prostate with lower urinary tract symptoms: Secondary | ICD-10-CM | POA: Diagnosis not present

## 2020-11-07 DIAGNOSIS — R351 Nocturia: Secondary | ICD-10-CM | POA: Diagnosis not present

## 2020-11-07 DIAGNOSIS — M720 Palmar fascial fibromatosis [Dupuytren]: Secondary | ICD-10-CM | POA: Diagnosis not present

## 2020-11-07 DIAGNOSIS — N5201 Erectile dysfunction due to arterial insufficiency: Secondary | ICD-10-CM | POA: Diagnosis not present

## 2020-11-07 NOTE — Progress Notes (Signed)
Subjective:    Patient ID: Jonathan [name], male    DOB: 1933/01/05, 87 y.o.   MRN: [id number]  HPI The patient is here for follow up of their chronic medical problems, including anxiety/panic attacks, insomnia, depression,  DM, htn, hypothyroidism, CKD  His sugars have been high.  He has not been checking them regularly.  Cortisone shot last week  140, 112, 171, 154, 206, 256 (after steroid inj), 279, 176   Active, no exercise.    Trying to do better with diet.  Has cut out ice cream.    Inc short term memory issues  Increasing the prozac several months ago helped - he thinks inc it more may help.    Gong to sleep difficulties.  occ takes oxycodone to sleep.  occ shot of whiskey. Diff going to sleep - sometimes wakes early.      Medications and allergies reviewed with patient and updated if appropriate.  Patient Active Problem List   Diagnosis Date Noted  . Anxiety 06/12/2020  . Recurrent falls 03/25/2020  . SDH (subdural hematoma) ([place]) 03/21/2020  . Intracranial hemorrhage ([place]) 03/14/2020  . Fall at home, subsequent encounter 03/14/2020  . Leukocytosis 03/14/2020  . Nausea 09/13/2019  . Rotator cuff arthropathy of left shoulder 07/21/2019  . Dupuytren's disease of palm 07/12/2019  . Sebaceous cyst 06/27/2019  . COVID-19 05/08/2019  . Acute encephalopathy 05/08/2019  . Obstructive sleep apnea   . Asymmetrical left sensorineural hearing loss 07/12/2018  . Chronic anticoagulation 12/21/2017  . Compression fracture of thoracic vertebra ([place]) 09/21/2017  . Degeneration of thoracic intervertebral disc 09/21/2017  . Thoracic back pain 08/10/2017  . Degenerative joint disease of hand 07/14/2017  . Osteoarthritis   . Hypoxia   . Fibromyalgia   . Fatty tumor   . Cataract   . BPH (benign prostatic hyperplasia)   . Chronic atrial fibrillation ([place])   . Insomnia 03/10/2017  . Chronic pain, legs and back 03/10/2017  . Hyperlipidemia 03/10/2017  . Obesity (BMI  30.0-34.9) 12/08/2016  . Depression 09/07/2016  . Spondylolisthesis 09/07/2016  . Hypothyroidism 03/08/2016  . GERD (gastroesophageal reflux disease) 03/08/2016  . Gout 03/08/2016  . Glaucoma 03/08/2016  . Type 2 diabetes mellitus ([place]) 03/08/2016  . Hereditary and idiopathic peripheral neuropathy 08/28/2015  . Essential hypertension 12/04/2011  . Allergic rhinitis, seasonal 12/04/2011  . COPD (chronic obstructive pulmonary disease) ([place]) 07/18/2011    Current Outpatient Medications on File Prior to Visit  Medication Sig Dispense Refill  . acetaminophen (TYLENOL) 650 MG CR tablet Take 650 mg by mouth in the morning and at bedtime. Pt takes 2 tabs 2 times daily    . allopurinol (ZYLOPRIM) 300 MG tablet Take 1 tablet (300 mg total) by mouth every evening. For gout 90 tablet 3  . apixaban (ELIQUIS) 5 MG TABS tablet Take 1 tablet (5 mg total) by mouth 2 (two) times daily. 180 tablet 1  . atorvastatin (LIPITOR) 20 MG tablet Take 1 tablet (20 mg total) by mouth daily. For cholesterol 90 tablet 1  . bimatoprost (LUMIGAN) 0.01 % SOLN Place 1 drop into both eyes at bedtime.     . Biotin 5000 MCG TABS Take 5,000 mcg by mouth daily.    . calcium-vitamin D (OSCAL WITH D) 500-200 MG-UNIT tablet Take 1 tablet by mouth daily with breakfast.    . colchicine 0.6 MG tablet Take 0.6 mg by mouth daily as needed (flareups).     . diltiazem (CARDIZEM CD) 300 MG 24 hr  capsule TAKE ONE CAPSULE BY MOUTH DAILY 90 capsule 3  . diltiazem (CARDIZEM) 30 MG tablet Take 1 tablet (30 mg total) by mouth 2 (two) times daily as needed (sustained elevated heart rates >100 bpm for > 20 minutes.). 30 tablet 2  . glucose blood test strip Use to check blood sugar daily. E11.9 100 each 3  . hydrochlorothiazide (HYDRODIURIL) 25 MG tablet Take 1 tablet (25 mg total) by mouth daily. For blood pressure 90 tablet 3  . Lancets MISC Use to check blood sugars daily. E11.9 100 each 3  . levothyroxine (SYNTHROID) 112 MCG tablet Take 1  tablet (112 mcg total) by mouth daily before breakfast. For thyroid 90 tablet 1  . LORazepam (ATIVAN) 0.5 MG tablet TAKE ONE TABLET BY MOUTH TWICE A DAY AS NEEDED FOR ANXIETY 30 tablet 0  . magnesium oxide (MAG-OX) 400 MG tablet Take 400 mg by mouth daily.    . Melatonin 5 MG TABS Take 10 mg by mouth at bedtime.    . metFORMIN (GLUCOPHAGE-XR) 750 MG 24 hr tablet Take 1 tablet (750 mg total) by mouth in the morning and at bedtime. For diabetes 180 tablet 1  . mometasone (NASONEX) 50 MCG/ACT nasal spray Place 2 sprays into the nose daily. (Patient taking differently: Place 2 sprays into the nose as needed (allergies).) 17 g 3  . Multiple Vitamin (MULTIVITAMIN) tablet Take 1 tablet by mouth daily.    . nitroGLYCERIN (NITROSTAT) 0.4 MG SL tablet Place 1 tablet (0.4 mg total) under the tongue every 5 (five) minutes as needed for chest pain. 90 tablet 3  . omeprazole (PRILOSEC) 20 MG capsule TAKE 1 CAPSULE BY MOUTH EVERY DAY.  For heartburn 90 capsule 1  . potassium chloride SA (KLOR-CON M20) 20 MEQ tablet Take 1 tablet (20 mEq total) by mouth daily. For low potassium 90 tablet 1  . RESTASIS 0.05 % ophthalmic emulsion Place 1 drop into both eyes 2 (two) times daily.     . sildenafil (REVATIO) 20 MG tablet Take 40-100 mg by mouth daily as needed.    . silodosin (RAPAFLO) 8 MG CAPS capsule Take 8 mg by mouth daily.    . solifenacin (VESICARE) 10 MG tablet Take 10 mg by mouth daily.    . tamsulosin (FLOMAX) 0.4 MG CAPS capsule Take 0.4 mg by mouth daily.    . vitamin C (ASCORBIC ACID) 500 MG tablet Take 500 mg by mouth 2 (two) times daily.     Marland Kitchen VITAMIN D, CHOLECALCIFEROL, PO Take 25 mcg by mouth daily.     . [DISCONTINUED] FLUoxetine (PROZAC) 20 MG tablet Take 1.5 tablets (30 mg total) by mouth daily. 135 tablet 1   No current facility-administered medications on file prior to visit.    Past Medical History:  Diagnosis Date  . Atrial fibrillation (HCC)    a. paroxysmal, on Eliquis for  anticoagulation  . BPH (benign prostatic hyperplasia)   . Bronchitis   . Cataract   . COPD (chronic obstructive pulmonary disease) (H[facility])    2 liters O2 HS  . Depression   . Fatty tumor    waste and back  . Fibromyalgia   . GERD (gastroesophageal reflux disease)   . Glaucoma    bilateral eyes  . Gout   . Hereditary and idiopathic peripheral neuropathy 08/28/2015  . Hypertension   . Hypothyroidism   . Hypoxia   . Insomnia   . Osteoarthritis   . Osteoporosis   . Peripheral neuropathy   .  Sleep apnea    uses cpap-add oxygen at night  . Spinal compression fracture (HCC) seventh vertebre  . Transient alteration of awareness 08/28/2015  . Type 2 diabetes mellitus ([place]) 03/08/2016    Past Surgical History:  Procedure Laterality Date  . APPENDECTOMY  age 67  . CARPAL TUNNEL RELEASE Right    early 2000s  . CATARACT EXTRACTION     bilateral  . CHOLECYSTECTOMY  age 34  . EYE SURGERY    . FOOT ARTHRODESIS Right 02/02/2013   Procedure: RIGHT HALLUX METATARSAL PHALANGEAL JOINT ARTHRODESIS ;  Surgeon: Wylene Simmer, MD;  Location: [facility];  Service: Orthopedics;  Laterality: Right;  . INGUINAL HERNIA REPAIR  age 67   rt side  . NASAL CONCHA BULLOSA RESECTION  age 57  . PROSTATE SURGERY    . SHOULDER ARTHROSCOPY W/ ROTATOR CUFF REPAIR Right    early 2000s  . tonsil    . VASECTOMY  age 61    Social History   Socioeconomic History  . Marital status: Married    Spouse name: Not on file  . Number of children: 2  . Years of education: 57  . Highest education level: Master's degree (e.g., MA, MS, MEng, MEd, MSW, MBA)  Occupational History  . Occupation: RETIRED    Employer: RETIRED  Tobacco Use  . Smoking status: Former Smoker    Packs/day: 2.00    Years: 10.00    Pack years: 20.00    Types: Cigarettes    Quit date: 07/18/1975    Years since quitting: 45.3  . Smokeless tobacco: Never Used  Vaping Use  . Vaping Use: Never used  Substance and Sexual Activity   . Alcohol use: Yes    Comment: 1-2 drinks/day  . Drug use: No  . Sexual activity: Not Currently  Other Topics Concern  . Not on file  Social History Narrative  . Not on file   Social Determinants of Health   Financial Resource Strain: Low Risk   . Difficulty of Paying Living Expenses: Not hard at all  Food Insecurity: No Food Insecurity  . Worried About Charity fundraiser in the Last Year: Never true  . Ran Out of Food in the Last Year: Never true  Transportation Needs: No Transportation Needs  . Lack of Transportation (Medical): No  . Lack of Transportation (Non-Medical): No  Physical Activity: Inactive  . Days of Exercise per Week: 0 days  . Minutes of Exercise per Session: 0 min  Stress: No Stress Concern Present  . Feeling of Stress : Not at all  Social Connections: Socially Integrated  . Frequency of Communication with Friends and Family: More than three times a week  . Frequency of Social Gatherings with Friends and Family: Once a week  . Attends Religious Services: 1 to 4 times per year  . Active Member of Clubs or Organizations: Yes  . Attends Archivist Meetings: 1 to 4 times per year  . Marital Status: Married    Family History  Problem Relation Age of Onset  . Coronary artery disease Brother   . Heart disease Father   . Lung cancer Father   . Kidney cancer Father   . Prostate cancer Father   . Arthritis Mother   . Lung cancer Mother   . Dementia Mother        Unspecified type, not Alzheimer's disease    Review of Systems  Constitutional: Negative for fever.  Respiratory: Positive for cough (?  allergies - improved with zyrtec) and shortness of breath. Negative for wheezing.   Cardiovascular: Positive for leg swelling (mild at end of day). Negative for chest pain and palpitations.  Neurological: Negative for dizziness, light-headedness and headaches.       Objective:   Vitals:   11/08/20 1507  BP: 134/60  Pulse: 64  Temp: 98.2 F (36.8  C)  SpO2: 94%   BP Readings from Last 3 Encounters:  11/08/20 134/60  06/18/20 (!) 142/78  06/17/20 (!) 142/68   Wt Readings from Last 3 Encounters:  11/08/20 199 lb 3.2 oz (90.4 kg)  07/02/20 196 lb (88.9 kg)  06/17/20 198 lb 3.2 oz (89.9 kg)   Body mass index is 31.2 kg/m.   Depression screen [facility] 2/9 11/08/2020 06/12/2020 04/26/2020 08/14/2019 04/26/2019  Decreased Interest 0 1 0 2 1  Down, Depressed, Hopeless 1 2 0 2 1  PHQ - 2 Score 1 3 0 4 2  Altered sleeping 3 3 - 3 3  Tired, decreased energy 3 0 - 2 1  Change in appetite 0 0 - 2 0  Feeling bad or failure about yourself  0 0 - 2 1  Trouble concentrating 0 0 - 3 0  Moving slowly or fidgety/restless 0 0 - 0 0  Suicidal thoughts 0 0 - 0 0  PHQ-9 Score 7 6 - 16 7  Difficult doing work/chores Somewhat difficult - - - Not difficult at all  Some recent data might be hidden    GAD 7 : Generalized Anxiety Score 11/08/2020 08/14/2019  Nervous, Anxious, on Edge 0 0  Control/stop worrying 1 0  Worry too much - different things 1 1  Trouble relaxing 0 1  Restless 0 0  Easily annoyed or irritable 1 1  Afraid - awful might happen 0 0  Total GAD 7 Score 3 3  Anxiety Difficulty Not difficult at all -       Physical Exam    Constitutional: Appears well-developed and well-nourished. No distress.  HENT:  Head: Normocephalic and atraumatic.  Neck: Neck supple. No tracheal deviation present. No thyromegaly present.  No cervical lymphadenopathy Cardiovascular: Normal rate, regular rhythm and normal heart sounds.   2/6 sys murmur heard. No carotid bruit .  No edema Pulmonary/Chest: Effort normal and breath sounds normal. No respiratory distress. No has no wheezes. No rales.  Skin: Skin is warm and dry. Not diaphoretic.  Psychiatric: Normal mood and affect. Behavior is normal.      Assessment & Plan:    See Problem List for Assessment and Plan of chronic medical problems.    This visit occurred during the SARS-CoV-2 public  health emergency.  Safety protocols were in place, including screening questions prior to the visit, additional usage of staff PPE, and extensive cleaning of exam room while observing appropriate contact time as indicated for disinfecting solutions.

## 2020-11-07 NOTE — Patient Instructions (Addendum)
  Blood work was ordered.     Medications changes include :   Increase prozac to 40 mg daily.  Start farxiga daily for your diabetes.    Your prescription(s) have been submitted to your pharmacy. Please take as directed and contact our office if you believe you are having problem(s) with the medication(s).     Please followup in 3 months

## 2020-11-08 ENCOUNTER — Ambulatory Visit (INDEPENDENT_AMBULATORY_CARE_PROVIDER_SITE_OTHER): Payer: Medicare Other | Admitting: Internal Medicine

## 2020-11-08 ENCOUNTER — Encounter: Payer: Self-pay | Admitting: Internal Medicine

## 2020-11-08 ENCOUNTER — Other Ambulatory Visit: Payer: Self-pay

## 2020-11-08 VITALS — BP 134/60 | HR 64 | Temp 98.2°F | Ht 67.0 in | Wt 199.2 lb

## 2020-11-08 DIAGNOSIS — E039 Hypothyroidism, unspecified: Secondary | ICD-10-CM | POA: Diagnosis not present

## 2020-11-08 DIAGNOSIS — I10 Essential (primary) hypertension: Secondary | ICD-10-CM | POA: Diagnosis not present

## 2020-11-08 DIAGNOSIS — E559 Vitamin D deficiency, unspecified: Secondary | ICD-10-CM

## 2020-11-08 DIAGNOSIS — G4700 Insomnia, unspecified: Secondary | ICD-10-CM

## 2020-11-08 DIAGNOSIS — J301 Allergic rhinitis due to pollen: Secondary | ICD-10-CM | POA: Diagnosis not present

## 2020-11-08 DIAGNOSIS — F419 Anxiety disorder, unspecified: Secondary | ICD-10-CM | POA: Diagnosis not present

## 2020-11-08 DIAGNOSIS — E1165 Type 2 diabetes mellitus with hyperglycemia: Secondary | ICD-10-CM

## 2020-11-08 DIAGNOSIS — E782 Mixed hyperlipidemia: Secondary | ICD-10-CM

## 2020-11-08 DIAGNOSIS — F3289 Other specified depressive episodes: Secondary | ICD-10-CM | POA: Diagnosis not present

## 2020-11-08 DIAGNOSIS — M19049 Primary osteoarthritis, unspecified hand: Secondary | ICD-10-CM

## 2020-11-08 HISTORY — DX: Primary osteoarthritis, unspecified hand: M19.049

## 2020-11-08 MED ORDER — GLUCOSE BLOOD VI STRP: ORAL_STRIP | 3 refills | Status: DC

## 2020-11-08 MED ORDER — FREESTYLE LIBRE 14 DAY SENSOR MISC: 5 refills | Status: DC

## 2020-11-08 MED ORDER — FLUOXETINE HCL 40 MG PO CAPS: 40.0000 mg | ORAL_CAPSULE | Freq: Every day | ORAL | 3 refills | Status: DC

## 2020-11-08 MED ORDER — FREESTYLE LIBRE 14 DAY READER DEVI: 0 refills | Status: DC

## 2020-11-08 MED ORDER — DAPAGLIFLOZIN PROPANEDIOL 10 MG PO TABS: 10.0000 mg | ORAL_TABLET | Freq: Every day | ORAL | 5 refills | Status: DC

## 2020-11-08 MED ORDER — LANCETS MISC: 3 refills | Status: AC

## 2020-11-08 MED ORDER — MOMETASONE FUROATE 50 MCG/ACT NA SUSP: 2.0000 | NASAL | 5 refills | Status: DC | PRN

## 2020-11-08 NOTE — Assessment & Plan Note (Signed)
Chronic Not ideally controlled based on sugars from home and last A1c Continue metformin 750 XR twice daily Start Farxiga 10 mg daily-discussed possible side effects

## 2020-11-08 NOTE — Assessment & Plan Note (Signed)
Chronic Requesting refill of steroid nasal spray for chronic dry cough, PND Continue zyrtec nasonex renewed

## 2020-11-08 NOTE — Assessment & Plan Note (Signed)
Chronic Still not sleeping well Has not had luck with anything over-the-counter Will work on better controlling his anxiety and depression which may help He has had side effects from his sleep medication in the past resulting in falls and subdural hematoma so I do not want to prescribe anything for sleep

## 2020-11-08 NOTE — Assessment & Plan Note (Addendum)
Chronic  Clinically euthyroid Currently taking levothyroxine 112 mcg qd Check tsh  Titrate med dose if needed

## 2020-11-08 NOTE — Assessment & Plan Note (Signed)
Chronic Controlled, stable Continue diltiazem 300 mg daily, hydrochlorothiazide 25 mg daily CMP

## 2020-11-08 NOTE — Assessment & Plan Note (Addendum)
Chronic Not ideally controlled-PHQ-9 score 7, rating difficulty doing daily activities is somewhat difficult Increase prozac to 40 mg daily

## 2020-11-08 NOTE — Assessment & Plan Note (Signed)
Chronic GAD-7 score 3, but not making daily activities difficult Will increase fluoxetine to better control anxiety and depression-increase to 40 mg daily

## 2020-11-08 NOTE — Assessment & Plan Note (Signed)
Chronic Check lipid panel, CMP Continue atorvastatin 20 mg daily Encouraged increased activity, regular exercise and healthy diet

## 2020-11-09 LAB — CBC WITH DIFFERENTIAL/PLATELET
Absolute Monocytes: 1742 cells/uL — ABNORMAL HIGH (ref 200–950)
Basophils Absolute: 27 cells/uL (ref 0–200)
Basophils Relative: 0.2 %
Eosinophils Absolute: 40 cells/uL (ref 15–500)
Eosinophils Relative: 0.3 %
HCT: 40.3 % (ref 38.5–50.0)
Hemoglobin: 13.3 g/dL (ref 13.2–17.1)
Lymphs Abs: 1503 cells/uL (ref 850–3900)
MCH: 30.1 pg (ref 27.0–33.0)
MCHC: 33 g/dL (ref 32.0–36.0)
MCV: 91.2 fL (ref 80.0–100.0)
MPV: 12.2 fL (ref 7.5–12.5)
Monocytes Relative: 13.1 %
Neutro Abs: 9988 cells/uL — ABNORMAL HIGH (ref 1500–7800)
Neutrophils Relative %: 75.1 %
Platelets: 128 10*3/uL — ABNORMAL LOW (ref 140–400)
RBC: 4.42 10*6/uL (ref 4.20–5.80)
RDW: 12.7 % (ref 11.0–15.0)
Total Lymphocyte: 11.3 %
WBC: 13.3 10*3/uL — ABNORMAL HIGH (ref 3.8–10.8)

## 2020-11-09 LAB — COMPREHENSIVE METABOLIC PANEL
AG Ratio: 2.1 (calc) (ref 1.0–2.5)
ALT: 14 U/L (ref 9–46)
AST: 12 U/L (ref 10–35)
Albumin: 4.5 g/dL (ref 3.6–5.1)
Alkaline phosphatase (APISO): 87 U/L (ref 35–144)
BUN/Creatinine Ratio: 23 (calc) — ABNORMAL HIGH (ref 6–22)
BUN: 26 mg/dL — ABNORMAL HIGH (ref 7–25)
CO2: 25 mmol/L (ref 20–32)
Calcium: 9.8 mg/dL (ref 8.6–10.3)
Chloride: 102 mmol/L (ref 98–110)
Creat: 1.14 mg/dL — ABNORMAL HIGH (ref 0.70–1.11)
Globulin: 2.1 g/dL (calc) (ref 1.9–3.7)
Glucose, Bld: 168 mg/dL — ABNORMAL HIGH (ref 65–99)
Potassium: 4.2 mmol/L (ref 3.5–5.3)
Sodium: 142 mmol/L (ref 135–146)
Total Bilirubin: 0.3 mg/dL (ref 0.2–1.2)
Total Protein: 6.6 g/dL (ref 6.1–8.1)

## 2020-11-09 LAB — LIPID PANEL
Cholesterol: 138 mg/dL (ref <200)
HDL: 42 mg/dL (ref >=40)
LDL Cholesterol (Calc): 65 mg/dL (calc)
Non-HDL Cholesterol (Calc): 96 mg/dL (calc) (ref <130)
Total CHOL/HDL Ratio: 3.3 (calc) (ref <5.0)
Triglycerides: 245 mg/dL — ABNORMAL HIGH (ref <150)

## 2020-11-09 LAB — MICROALBUMIN / CREATININE URINE RATIO
Creatinine, Urine: 154 mg/dL (ref 20–320)
Microalb Creat Ratio: 638 mcg/mg creat — ABNORMAL HIGH (ref <30)
Microalb, Ur: 98.2 mg/dL

## 2020-11-09 LAB — HEMOGLOBIN A1C
Hgb A1c MFr Bld: 7.7 % of total Hgb — ABNORMAL HIGH (ref <5.7)
Mean Plasma Glucose: 174 mg/dL
eAG (mmol/L): 9.7 mmol/L

## 2020-11-09 LAB — TSH: TSH: 0.92 mIU/L (ref 0.40–4.50)

## 2020-11-09 LAB — VITAMIN D 25 HYDROXY (VIT D DEFICIENCY, FRACTURES): Vit D, 25-Hydroxy: 58 ng/mL (ref 30–100)

## 2020-11-12 NOTE — Telephone Encounter (Signed)
[facility] called back to check on the status of referral for the patient. Patient is scheduled already on 11/13/2020 but will need the referral completed today to be seen on 11/13/2020. Per office referral needs to have either e-signature or manual signature.    Please advise.

## 2020-11-28 DIAGNOSIS — M1712 Unilateral primary osteoarthritis, left knee: Secondary | ICD-10-CM | POA: Diagnosis not present

## 2020-12-06 DIAGNOSIS — M25562 Pain in left knee: Secondary | ICD-10-CM | POA: Diagnosis not present

## 2020-12-06 DIAGNOSIS — M1712 Unilateral primary osteoarthritis, left knee: Secondary | ICD-10-CM | POA: Diagnosis not present

## 2020-12-12 DIAGNOSIS — M1712 Unilateral primary osteoarthritis, left knee: Secondary | ICD-10-CM | POA: Diagnosis not present

## 2020-12-12 DIAGNOSIS — M25562 Pain in left knee: Secondary | ICD-10-CM | POA: Diagnosis not present

## 2020-12-17 ENCOUNTER — Other Ambulatory Visit: Payer: Self-pay | Admitting: Internal Medicine

## 2020-12-19 ENCOUNTER — Encounter: Payer: Self-pay | Admitting: Internal Medicine

## 2020-12-19 NOTE — Patient Instructions (Signed)
  Blood work was ordered.     Medications changes include :   Lyrica 75 mg twice daily  Your prescription(s) have been submitted to your pharmacy. Please take as directed and contact our office if you believe you are having problem(s) with the medication(s).   A referral was ordered for Dr Melvyn Novas ( neuropsychology) and podiatry.       Someone from their office will call you to schedule an appointment.    An ultrasound of your abdomen was ordered - someone will call you to schedule this.    Please followup in 6 weeks

## 2020-12-19 NOTE — Progress Notes (Signed)
Subjective:    Patient ID: Jonathan [name], male    DOB: [birth year], 87 y.o.   MRN: [id number]  HPI The patient is here for an acute visit.  He is here alone.   Extreme pain, can't stand 15 minutes, fibromyalgia -  ( feet, legs, hips, shoulders, hands) - he cooks dinner and by the time he is done he can't tolerate the pain - pain is everywhere.  He used to take lyrica - stopped it due to expense.    He does have peripheral neuropathy - his feet burn.    Frequent urination  - last week urinating 4-6/hr and up at night 5-6 times, now 1/hr - he has appt with urology on Monday.  He was drinking a lot of juice-his wife pointed out that that could contain a lot of sugar.  He has stopped drinking that..   Insomnia - worse - has failed Belsomra, trazodone, ambien (falls), sonata.  Sometimes keeps him awake.  Sometimes his mind does not shut off.  Sometimes he takes oxycodone and alcohol.  He is taking lorazepam at night, which does not seem to be helping anymore.   Dementia - need another evaluation - his memory has been much much worse.  His short term memory is very bad.  He has left the burning on the stove 3-4 times.    He feels overwhelmed at times.     He falls asleep a lot during the day.  He typically does not realize he falls asleep or wakes up.  His wife will pointed out to him.  His wife states he is more sarcastic and less likely to engage in conversations.    4th 143 fasting,  this morning fasting 160  Medications and allergies reviewed with patient and updated if appropriate.  Patient Active Problem List   Diagnosis Date Noted   Memory difficulties 12/19/2020   Anxiety 06/12/2020   Recurrent falls 03/25/2020   SDH (subdural hematoma) ([place]) 03/21/2020   Intracranial hemorrhage ([place]) 03/14/2020   Fall at home, subsequent encounter 03/14/2020   Leukocytosis 03/14/2020   Nausea 09/13/2019   Rotator cuff arthropathy of left shoulder 07/21/2019   Dupuytren's disease of  palm 07/12/2019   Sebaceous cyst 06/27/2019   COVID-19 05/08/2019   Acute encephalopathy 05/08/2019   Obstructive sleep apnea    Asymmetrical left sensorineural hearing loss 07/12/2018   Chronic anticoagulation 12/21/2017   Compression fracture of thoracic vertebra (HCC) 09/21/2017   Degeneration of thoracic intervertebral disc 09/21/2017   Thoracic back pain 08/10/2017   Degenerative joint disease of hand 07/14/2017   Osteoarthritis    Hypoxia    Fibromyalgia    Fatty tumor    Cataract    BPH (benign prostatic hyperplasia)    Chronic atrial fibrillation (HCC)    Insomnia 03/10/2017   Chronic pain, legs and back 03/10/2017   Hyperlipidemia 03/10/2017   Obesity (BMI 30.0-34.9) 12/08/2016   Depression 09/07/2016   Spondylolisthesis 09/07/2016   Hypothyroidism 03/08/2016   GERD (gastroesophageal reflux disease) 03/08/2016   Gout 03/08/2016   Glaucoma 03/08/2016   Type 2 diabetes mellitus ([place]) 03/08/2016   Hereditary and idiopathic peripheral neuropathy 08/28/2015   Essential hypertension 12/04/2011   Allergic rhinitis, seasonal 12/04/2011   COPD (chronic obstructive pulmonary disease) ([place]) 07/18/2011    Current Outpatient Medications on File Prior to Visit  Medication Sig Dispense Refill   acetaminophen (TYLENOL) 650 MG CR tablet Take 650 mg by mouth in the morning and at bedtime. Pt takes  2 tabs 2 times daily     allopurinol (ZYLOPRIM) 300 MG tablet Take 1 tablet (300 mg total) by mouth every evening. For gout 90 tablet 3   apixaban (ELIQUIS) 5 MG TABS tablet Take 1 tablet (5 mg total) by mouth 2 (two) times daily. 180 tablet 1   atorvastatin (LIPITOR) 20 MG tablet Take 1 tablet (20 mg total) by mouth daily. For cholesterol 90 tablet 1   bimatoprost (LUMIGAN) 0.01 % SOLN Place 1 drop into both eyes at bedtime.      Biotin 5000 MCG TABS Take 5,000 mcg by mouth daily.     calcium-vitamin D (OSCAL WITH D) 500-200 MG-UNIT tablet Take 1 tablet by mouth daily with breakfast.      colchicine 0.6 MG tablet Take 0.6 mg by mouth daily as needed (flareups).      Continuous Blood Gluc Receiver (FREESTYLE LIBRE 14 DAY READER) DEVI UAD to check sugars.  E11.65 1 each 0   Continuous Blood Gluc Sensor (FREESTYLE LIBRE 14 DAY SENSOR) MISC UAD to check sugars, E11.65 2 each 5   dapagliflozin propanediol (FARXIGA) 10 MG TABS tablet Take 1 tablet (10 mg total) by mouth daily before breakfast. For diabetes 30 tablet 5   diltiazem (CARDIZEM CD) 300 MG 24 hr capsule TAKE ONE CAPSULE BY MOUTH DAILY 90 capsule 3   diltiazem (CARDIZEM) 30 MG tablet Take 1 tablet (30 mg total) by mouth 2 (two) times daily as needed (sustained elevated heart rates >100 bpm for > 20 minutes.). 30 tablet 2   FLUoxetine (PROZAC) 40 MG capsule Take 1 capsule (40 mg total) by mouth daily. For depression 90 capsule 3   glucose blood test strip Use to check blood sugar daily. E11.9 100 each 3   hydrochlorothiazide (HYDRODIURIL) 25 MG tablet Take 1 tablet (25 mg total) by mouth daily. For blood pressure 90 tablet 3   Lancets MISC Use to check blood sugars daily. E11.9 100 each 3   levothyroxine (SYNTHROID) 112 MCG tablet Take 1 tablet (112 mcg total) by mouth daily before breakfast. For thyroid 90 tablet 1   LORazepam (ATIVAN) 0.5 MG tablet TAKE ONE TABLET BY MOUTH TWICE A DAY AS NEEDED FOR ANXIETY 30 tablet 0   magnesium oxide (MAG-OX) 400 MG tablet Take 400 mg by mouth daily.     Melatonin 5 MG TABS Take 10 mg by mouth at bedtime.     metFORMIN (GLUCOPHAGE-XR) 750 MG 24 hr tablet Take 1 tablet (750 mg total) by mouth in the morning and at bedtime. For diabetes 180 tablet 1   mometasone (NASONEX) 50 MCG/ACT nasal spray Place 2 sprays into the nose as needed (allergies). 17 g 5   Multiple Vitamin (MULTIVITAMIN) tablet Take 1 tablet by mouth daily.     nitroGLYCERIN (NITROSTAT) 0.4 MG SL tablet Place 1 tablet (0.4 mg total) under the tongue every 5 (five) minutes as needed for chest pain. 90 tablet 3   omeprazole  (PRILOSEC) 20 MG capsule TAKE ONE CAPSULE BY MOUTH EVERYDAY FOR HEARTBURN 90 capsule 1   potassium chloride SA (KLOR-CON M20) 20 MEQ tablet Take 1 tablet (20 mEq total) by mouth daily. For low potassium 90 tablet 1   RESTASIS 0.05 % ophthalmic emulsion Place 1 drop into both eyes 2 (two) times daily.      sildenafil (REVATIO) 20 MG tablet Take 40-100 mg by mouth daily as needed.     silodosin (RAPAFLO) 8 MG CAPS capsule Take 8 mg by mouth daily.  solifenacin (VESICARE) 10 MG tablet Take 10 mg by mouth daily.     tamsulosin (FLOMAX) 0.4 MG CAPS capsule Take 0.4 mg by mouth daily.     vitamin C (ASCORBIC ACID) 500 MG tablet Take 500 mg by mouth 2 (two) times daily.      VITAMIN D, CHOLECALCIFEROL, PO Take 25 mcg by mouth daily.      No current facility-administered medications on file prior to visit.    Past Medical History:  Diagnosis Date   Atrial fibrillation (HCC)    a. paroxysmal, on Eliquis for anticoagulation   BPH (benign prostatic hyperplasia)    Bronchitis    Cataract    COPD (chronic obstructive pulmonary disease) (HCC)    2 liters O2 HS   Depression    Fatty tumor    waste and back   Fibromyalgia    GERD (gastroesophageal reflux disease)    Glaucoma    bilateral eyes   Gout    Hereditary and idiopathic peripheral neuropathy 08/28/2015   Hypertension    Hypothyroidism    Hypoxia    Insomnia    Osteoarthritis    Osteoporosis    Peripheral neuropathy    Sleep apnea    uses cpap-add oxygen at night   Spinal compression fracture (HCC) seventh vertebre   Transient alteration of awareness 08/28/2015   Type 2 diabetes mellitus ([place]) 03/08/2016    Past Surgical History:  Procedure Laterality Date   APPENDECTOMY  age 72   CARPAL TUNNEL RELEASE Right    early 2000s   CATARACT EXTRACTION     bilateral   CHOLECYSTECTOMY  age 59   EYE SURGERY     FOOT ARTHRODESIS Right 02/02/2013   Procedure: RIGHT HALLUX METATARSAL PHALANGEAL JOINT ARTHRODESIS ;  Surgeon: Wylene Simmer, MD;  Location: [facility];  Service: Orthopedics;  Laterality: Right;   INGUINAL HERNIA REPAIR  age 64   rt side   NASAL CONCHA BULLOSA RESECTION  age 51   PROSTATE SURGERY     SHOULDER ARTHROSCOPY W/ ROTATOR CUFF REPAIR Right    early 2000s   tonsil     VASECTOMY  age 85    Social History   Socioeconomic History   Marital status: Married    Spouse name: Not on file   Number of children: 2   Years of education: 18   Highest education level: Master's degree (e.g., MA, MS, MEng, MEd, MSW, MBA)  Occupational History   Occupation: RETIRED    Employer: RETIRED  Tobacco Use   Smoking status: Former    Packs/day: 2.00    Years: 10.00    Pack years: 20.00    Types: Cigarettes    Quit date: 07/18/1975    Years since quitting: 45.4   Smokeless tobacco: Never  Vaping Use   Vaping Use: Never used  Substance and Sexual Activity   Alcohol use: Yes    Comment: 1-2 drinks/day   Drug use: No   Sexual activity: Not Currently  Other Topics Concern   Not on file  Social History Narrative   Not on file   Social Determinants of Health   Financial Resource Strain: Low Risk    Difficulty of Paying Living Expenses: Not hard at all  Food Insecurity: No Food Insecurity   Worried About Charity fundraiser in the Last Year: Never true   [place] in the Last Year: Never true  Transportation Needs: No Transportation Needs   Lack of  Transportation (Medical): No   Lack of Transportation (Non-Medical): No  Physical Activity: Inactive   Days of Exercise per Week: 0 days   Minutes of Exercise per Session: 0 min  Stress: No Stress Concern Present   Feeling of Stress : Not at all  Social Connections: Socially Integrated   Frequency of Communication with Friends and Family: More than three times a week   Frequency of Social Gatherings with Friends and Family: Once a week   Attends Religious Services: 1 to 4 times per year   Active Member of [employer] or Organizations:  Yes   Attends Archivist Meetings: 1 to 4 times per year   Marital Status: Married    Family History  Problem Relation Age of Onset   Coronary artery disease Brother    Heart disease Father    Lung cancer Father    Kidney cancer Father    Prostate cancer Father    Arthritis Mother    Lung cancer Mother    Dementia Mother        Unspecified type, not Alzheimer's disease    Review of Systems  Constitutional:  Negative for fever.  Respiratory:  Negative for cough, shortness of breath and wheezing.   Cardiovascular:  Positive for leg swelling (mild). Negative for chest pain and palpitations.  Musculoskeletal:  Positive for arthralgias, back pain and myalgias.  Neurological:  Positive for numbness and headaches (occ). Negative for dizziness and light-headedness.      Objective:   Vitals:   12/20/20 1055  BP: 130/74  Pulse: (!) 50  Temp: 98.1 F (36.7 C)  SpO2: 95%   BP Readings from Last 3 Encounters:  12/20/20 130/74  11/08/20 134/60  06/18/20 (!) 142/78   Wt Readings from Last 3 Encounters:  12/20/20 194 lb (88 kg)  11/08/20 199 lb 3.2 oz (90.4 kg)  07/02/20 196 lb (88.9 kg)   Body mass index is 30.38 kg/m.   Physical Exam Constitutional:      General: He is not in acute distress.    Appearance: Normal appearance. He is not ill-appearing.  HENT:     Head: Normocephalic and atraumatic.  Eyes:     Conjunctiva/sclera: Conjunctivae normal.  Neck:     Vascular: No carotid bruit.  Cardiovascular:     Rate and Rhythm: Normal rate and regular rhythm.     Heart sounds: No murmur heard. Pulmonary:     Effort: Pulmonary effort is normal. No respiratory distress.     Breath sounds: No wheezing or rales.  Abdominal:     General: There is no distension.     Tenderness: There is no abdominal tenderness. There is no guarding or rebound.     Hernia: A hernia (Ventral-reducible and nontender) is present.  Musculoskeletal:     Cervical back: Neck supple. No  tenderness.     Right lower leg: No edema.     Left lower leg: No edema.  Lymphadenopathy:     Cervical: No cervical adenopathy.  Skin:    General: Skin is warm and dry.  Neurological:     Mental Status: He is alert and oriented to person, place, and time.  Psychiatric:        Mood and Affect: Mood normal.           Assessment & Plan:    I spent 45  minutes dedicated to the care of this patient on the date of this encounter including review of recent labs, imaging and procedures,  speciality notes, obtaining history, communicating with the patient, ordering medications, tests, and documenting clinical information in the EHR   See Problem List for Assessment and Plan of chronic medical problems.    This visit occurred during the SARS-CoV-2 public health emergency.  Safety protocols were in place, including screening questions prior to the visit, additional usage of staff PPE, and extensive cleaning of exam room while observing appropriate contact time as indicated for disinfecting solutions.

## 2020-12-20 ENCOUNTER — Encounter: Payer: Self-pay | Admitting: Internal Medicine

## 2020-12-20 ENCOUNTER — Other Ambulatory Visit: Payer: Self-pay

## 2020-12-20 ENCOUNTER — Ambulatory Visit (INDEPENDENT_AMBULATORY_CARE_PROVIDER_SITE_OTHER): Payer: Medicare Other | Admitting: Internal Medicine

## 2020-12-20 VITALS — BP 130/74 | HR 50 | Temp 98.1°F | Ht 67.0 in | Wt 194.0 lb

## 2020-12-20 DIAGNOSIS — E1165 Type 2 diabetes mellitus with hyperglycemia: Secondary | ICD-10-CM | POA: Diagnosis not present

## 2020-12-20 DIAGNOSIS — M797 Fibromyalgia: Secondary | ICD-10-CM | POA: Diagnosis not present

## 2020-12-20 DIAGNOSIS — G4700 Insomnia, unspecified: Secondary | ICD-10-CM

## 2020-12-20 DIAGNOSIS — R413 Other amnesia: Secondary | ICD-10-CM

## 2020-12-20 DIAGNOSIS — G609 Hereditary and idiopathic neuropathy, unspecified: Secondary | ICD-10-CM | POA: Diagnosis not present

## 2020-12-20 DIAGNOSIS — E039 Hypothyroidism, unspecified: Secondary | ICD-10-CM

## 2020-12-20 DIAGNOSIS — I10 Essential (primary) hypertension: Secondary | ICD-10-CM

## 2020-12-20 DIAGNOSIS — D696 Thrombocytopenia, unspecified: Secondary | ICD-10-CM | POA: Diagnosis not present

## 2020-12-20 DIAGNOSIS — R350 Frequency of micturition: Secondary | ICD-10-CM | POA: Diagnosis not present

## 2020-12-20 HISTORY — DX: Thrombocytopenia, unspecified: D69.6

## 2020-12-20 LAB — URINALYSIS, ROUTINE W REFLEX MICROSCOPIC
Bilirubin Urine: NEGATIVE
Hgb urine dipstick: NEGATIVE
Leukocytes,Ua: NEGATIVE
Nitrite: NEGATIVE
Specific Gravity, Urine: 1.025 (ref 1.000–1.030)
Total Protein, Urine: 100 — AB
Urine Glucose: >=1000 — AB
Urobilinogen, UA: 0.2 (ref 0.0–1.0)
pH: 5.5 (ref 5.0–8.0)

## 2020-12-20 LAB — TSH: TSH: 2.62 u[IU]/mL (ref 0.35–5.50)

## 2020-12-20 LAB — COMPREHENSIVE METABOLIC PANEL
ALT: 17 U/L (ref 0–53)
AST: 17 U/L (ref 0–37)
Albumin: 4.6 g/dL (ref 3.5–5.2)
Alkaline Phosphatase: 95 U/L (ref 39–117)
BUN: 21 mg/dL (ref 6–23)
CO2: 28 mEq/L (ref 19–32)
Calcium: 9.5 mg/dL (ref 8.4–10.5)
Chloride: 101 mEq/L (ref 96–112)
Creatinine, Ser: 1.21 mg/dL (ref 0.40–1.50)
GFR: 54.59 mL/min — ABNORMAL LOW (ref >60.00)
Glucose, Bld: 129 mg/dL — ABNORMAL HIGH (ref 70–99)
Potassium: 3.9 mEq/L (ref 3.5–5.1)
Sodium: 140 mEq/L (ref 135–145)
Total Bilirubin: 0.6 mg/dL (ref 0.2–1.2)
Total Protein: 6.9 g/dL (ref 6.0–8.3)

## 2020-12-20 LAB — HEMOGLOBIN A1C: Hgb A1c MFr Bld: 7.5 % — ABNORMAL HIGH (ref 4.6–6.5)

## 2020-12-20 MED ORDER — PREGABALIN 75 MG PO CAPS: 75.0000 mg | ORAL_CAPSULE | Freq: Two times a day (BID) | ORAL | 0 refills | Status: DC

## 2020-12-20 NOTE — Assessment & Plan Note (Signed)
Chronic  Clinically euthyroid Currently taking levothyroxine 112 mcg daily Check tsh  Titrate med dose if needed

## 2020-12-20 NOTE — Assessment & Plan Note (Signed)
chronic Stable Ck Abd [place] to eval for HSM

## 2020-12-20 NOTE — Assessment & Plan Note (Signed)
acute Last week - better now Likely related to increased sugar intake Will check UA, Ucx to r/o infection

## 2020-12-20 NOTE — Assessment & Plan Note (Signed)
Chronic, but has gotten worse Re-evaluation with Dr Melvyn Novas - referral ordered - will also request Dr Delice Lesch to see him He understands a lot of his medications have a negative affect on his memory

## 2020-12-20 NOTE — Assessment & Plan Note (Signed)
Chronic Has been on several medications in the past and has had amnesia and falls as a result Currently taking lorazepam 0.5 mg at night, which is not working well Sometimes also adds oxycodone and small amount of alcohol, which I stressed is not a good idea and he should not be taking any alcohol with all of his medications he is taking Hopefully by controlling his pain by adding Lyrica back in twice daily that will help his sleep No other changes

## 2020-12-20 NOTE — Assessment & Plan Note (Signed)
Chronic Has had fibromyalgia for years Has not been on anything recently-having increased pain all over Start lyrica 75 mg bid - hopefully that will help F/u in 6 weeks

## 2020-12-20 NOTE — Assessment & Plan Note (Signed)
Chronic BP well controlled Continue diltiazem milligrams daily, hydrochlorothiazide 25 mg daily cmp

## 2020-12-20 NOTE — Assessment & Plan Note (Signed)
Chronic Lab Results  Component Value Date   HGBA1C 7.7 (H) 11/08/2020   Not ideally controlled Recheck A1c today Continue Farxiga 10 mg daily, metformin 750 mg twice daily Will adjust medications based on A1c

## 2020-12-20 NOTE — Assessment & Plan Note (Addendum)
Chronic Has been on gabapentin and Lyrica in the past-he states he stopped Lyrica because of cost-he is unsure which one worked better, but since he did get on Lyrica most likely the gabapentin did not work well Will restart Lyrica 75 mg twice daily Refer to podiatry for foot exam and possible DM shoes

## 2020-12-22 LAB — URINE CULTURE

## 2020-12-22 MED ORDER — RYBELSUS 3 MG PO TABS: 3.0000 mg | ORAL_TABLET | Freq: Every day | ORAL | 3 refills | Status: DC

## 2020-12-22 NOTE — Addendum Note (Signed)
Addended by: Binnie Rail on: 12/22/2020 06:37 PM   Modules accepted: Orders

## 2020-12-23 ENCOUNTER — Encounter: Payer: Self-pay | Admitting: Internal Medicine

## 2020-12-23 DIAGNOSIS — N401 Enlarged prostate with lower urinary tract symptoms: Secondary | ICD-10-CM | POA: Diagnosis not present

## 2020-12-23 DIAGNOSIS — R350 Frequency of micturition: Secondary | ICD-10-CM | POA: Diagnosis not present

## 2020-12-24 ENCOUNTER — Ambulatory Visit
Admission: RE | Admit: 2020-12-24 | Discharge: 2020-12-24 | Disposition: A | Discharge status: Home / Self Care | Payer: Medicare Other | Source: Ambulatory Visit | Attending: Internal Medicine | Admitting: Internal Medicine

## 2020-12-24 DIAGNOSIS — D696 Thrombocytopenia, unspecified: Secondary | ICD-10-CM

## 2020-12-24 DIAGNOSIS — K7689 Other specified diseases of liver: Secondary | ICD-10-CM | POA: Diagnosis not present

## 2020-12-24 DIAGNOSIS — Z9049 Acquired absence of other specified parts of digestive tract: Secondary | ICD-10-CM | POA: Diagnosis not present

## 2020-12-24 DIAGNOSIS — N281 Cyst of kidney, acquired: Secondary | ICD-10-CM | POA: Diagnosis not present

## 2020-12-25 ENCOUNTER — Encounter: Payer: Medicare Other | Admitting: Psychology

## 2020-12-25 ENCOUNTER — Encounter: Payer: Self-pay | Admitting: Psychology

## 2020-12-25 ENCOUNTER — Telehealth: Payer: Self-pay | Admitting: Psychology

## 2020-12-25 NOTE — Telephone Encounter (Signed)
Called and spoke with patient and offered him a sooner appointment on a Friday, per Dr. Melvyn Novas. The patient is now scheduled for 12/27/20 for neuropsychological testing at 1:00 PM, results on 01/03/21.

## 2020-12-26 ENCOUNTER — Ambulatory Visit (INDEPENDENT_AMBULATORY_CARE_PROVIDER_SITE_OTHER): Payer: Medicare Other | Admitting: Pharmacist

## 2020-12-26 ENCOUNTER — Other Ambulatory Visit: Payer: Self-pay

## 2020-12-26 DIAGNOSIS — F3289 Other specified depressive episodes: Secondary | ICD-10-CM

## 2020-12-26 DIAGNOSIS — E782 Mixed hyperlipidemia: Secondary | ICD-10-CM | POA: Diagnosis not present

## 2020-12-26 DIAGNOSIS — R413 Other amnesia: Secondary | ICD-10-CM

## 2020-12-26 DIAGNOSIS — R350 Frequency of micturition: Secondary | ICD-10-CM

## 2020-12-26 DIAGNOSIS — I10 Essential (primary) hypertension: Secondary | ICD-10-CM | POA: Diagnosis not present

## 2020-12-26 DIAGNOSIS — G609 Hereditary and idiopathic neuropathy, unspecified: Secondary | ICD-10-CM

## 2020-12-26 DIAGNOSIS — E1165 Type 2 diabetes mellitus with hyperglycemia: Secondary | ICD-10-CM

## 2020-12-26 DIAGNOSIS — I482 Chronic atrial fibrillation, unspecified: Secondary | ICD-10-CM

## 2020-12-26 NOTE — Progress Notes (Signed)
Chronic Care Management Pharmacy Note  12/26/2020 Name:  Jonathan [name] MRN:  [id number] DOB:  04/11/1933  Summary: -Possible side effects from pregabalin have resolved while pt is still taking it; pt reports improvement in pain -Solifenacin may exacerbate memory issues due to anticholinergic properties -Pt cannot afford Rybelsus and [place], will qualify for PAP  Recommendations/Changes made from today's visit: -Advised to continue pregabalin as prescribed given resolution of possible side effects and improvement in pain -Advised trial off of solifenacin (not clear how well it is working for urinary symptoms and may be worsening cognitive issues) -Pursue Pt assistance for Rybelsus and [place]. Pt will come to office to sign forms. Rybelsus 3 mg sample provided as well.   Subjective: Jonathan [name] is an 87 y.o. year old male who is a primary patient of Burns, Claudina Lick, MD.  The CCM team was consulted for assistance with disease management and care coordination needs.    Engaged with patient by telephone for follow up visit in response to provider referral for pharmacy case management and/or care coordination services.   Consent to Services:  The patient was given information about Chronic Care Management services, agreed to services, and gave verbal consent prior to initiation of services.  Please see initial visit note for detailed documentation.   Patient Care Team: Binnie Rail, MD as PCP - General (Internal Medicine) Stanford Breed Denice Bors, MD as PCP - Cardiology (Cardiology) Council Mechanic, MD as Referring Physician (Family Medicine) Earnie Larsson, MD as Consulting Physician (Neurosurgery) Stanford Breed Denice Bors, MD as Consulting Physician (Cardiology) Michael Boston, MD as Consulting Physician (General Surgery) Karen Kays, NP as Nurse Practitioner (Nurse Practitioner) [name], [facility] as Pharmacist (Pharmacist)  Recent office visits: 12/20/20 Dr Quay Burow OV: acute visit  for pain, fibromyalgia, frequent urination, worsening memory; restart Lyrica 75 mg BID; referred to podiatry for DM shoes; urination likely 2/2 increased sugar intake (Farxiga); referral to neurology for memory eval; ordered US abdomen  11/08/20 Dr Quay Burow OV: chronic f/u; BG elevated after cortisone shot; A1c 7.7%; started Farxiga 10 mg; increased Fluoxetine 30 to 40 mg; refilled nasal spray  06/18/19 Dr Quay Burow OV: c/o poor sleep, not currently taking medication. Retry melatonin. Probable gout flare, restart allopurinol 300 mg. Taper off duloxetine and start fluoxetine 20 mg daily. Consider therapist.   06/12/20 Dr Alain Marion OV: pt c/o insomnia, panic attacks, anxiety, sinusitis. Reduce Cymbalta to 1 daily. Discontinue Buspar. Rx low-dose lorazepam at night, stop alcohol. Rx'd ceftin.   05/24/20 pt message: panic attacks, rx'd buspar TID 04/18/20 Dr Quay Burow OV: chronic f/u. Stay off allopurinol. Restart Eliquis and f/u with cardiology.   03/25/20 Dr Quay Burow OV: hospital f/u. Recurrent falls - related to Ambien, will avoid sleep meds, taking melatonin only. Referred,  to PT for balance. Denies GERD, will taper off omeprazole. D/C allopurinol for now, check uric acid next visit. Increase metformin to BID.  Recent consult visits: 07/02/20 Dr Stanford Breed (cardiology): f/u Afib, continue Eliquis for now. If he has future falls will discontinue.  03/21/20 PA Kerin Ransom (cardiology): hospital f/u. Holding Eliquis, may restart soon given hx stroke off of Eliquis in 2019. Considering ARB for BP.  Hospital visits: 03/14/20 - 03/15/20 hospital admission: intracranial hemorrhage s/p multiple falls. [name]. Hold Eliquis (hx of TIA on previous holiday) and counseled on fall prevention (avoidance of Ambien, oxycodone, alcohol). Stopped Ambien and Sonata, rx'd trazodone.  Objective:  Lab Results  Component Value Date   CREATININE 1.21 12/20/2020   BUN 21  12/20/2020   GFR 54.59 (L) 12/20/2020   GFRNONAA >60  03/14/2020   GFRAA >60 03/14/2020   NA 140 12/20/2020   K 3.9 12/20/2020   CALCIUM 9.5 12/20/2020   CO2 28 12/20/2020    Lab Results  Component Value Date/Time   HGBA1C 7.5 (H) 12/20/2020 11:50 AM   HGBA1C 7.7 (H) 11/08/2020 04:24 PM   GFR 54.59 (L) 12/20/2020 11:50 AM   GFR 57.63 (L) 06/12/2020 11:31 AM   MICROALBUR 98.2 11/08/2020 04:24 PM   MICROALBUR 28.1 (H) 08/11/2017 08:48 AM    Last diabetic Eye exam:  Lab Results  Component Value Date/Time   HMDIABEYEEXA No Retinopathy 02/01/2019 12:00 AM    Last diabetic Foot exam: No results found for: HMDIABFOOTEX   Lab Results  Component Value Date   CHOL 138 11/08/2020   HDL 42 11/08/2020   LDLCALC 65 11/08/2020   LDLDIRECT 51.0 08/14/2019   TRIG 245 (H) 11/08/2020   CHOLHDL 3.3 11/08/2020    Hepatic Function Latest Ref Rng & Units 12/20/2020 11/08/2020 06/12/2020  Total Protein 6.0 - 8.3 g/dL 6.9 6.6 6.7  Albumin 3.5 - 5.2 g/dL 4.6 - 4.6  AST 0 - 37 U/L 17 12 24   ALT 0 - 53 U/L 17 14 29   Alk Phosphatase 39 - 117 U/L 95 - 102  Total Bilirubin 0.2 - 1.2 mg/dL 0.6 0.3 0.5    Lab Results  Component Value Date/Time   TSH 2.62 12/20/2020 11:50 AM   TSH 0.92 11/08/2020 04:24 PM    CBC Latest Ref Rng & Units 11/08/2020 06/12/2020 03/15/2020  WBC 3.8 - 10.8 Thousand/uL 13.3(H) 6.6 13.3(H)  Hemoglobin 13.2 - 17.1 g/dL 13.3 13.4 12.5(L)  Hematocrit 38.5 - 50.0 % 40.3 40.6 37.3(L)  Platelets 140 - 400 Thousand/uL 128(L) 120.0(L) 126(L)    Lab Results  Component Value Date/Time   VD25OH 58 11/08/2020 04:24 PM   VD25OH 90.58 06/12/2020 11:31 AM   VD25OH 41 11/23/2012 10:52 AM    Clinical ASCVD: Yes  The ASCVD Risk score Mikey Bussing DC Jr., et al., 2013) failed to calculate for the following reasons:   The 2013 ASCVD risk score is only valid for ages 71 to 72    Depression screen PHQ 2/9 11/08/2020 06/12/2020 04/26/2020  Decreased Interest 0 1 0  Down, Depressed, Hopeless 1 2 0  PHQ - 2 Score 1 3 0  Altered sleeping 3 3 -   Tired, decreased energy 3 0 -  Change in appetite 0 0 -  Feeling bad or failure about yourself  0 0 -  Trouble concentrating 0 0 -  Moving slowly or fidgety/restless 0 0 -  Suicidal thoughts 0 0 -  PHQ-9 Score 7 6 -  Difficult doing work/chores Somewhat difficult - -  Some recent data might be hidden     CHA2DS2-VASc Score = 6  The patient's score is based upon: CHF History: No HTN History: Yes Diabetes History: Yes Stroke History: Yes Vascular Disease History: No Age Score: 2 Gender Score: 0     Social History   Tobacco Use  Smoking Status Former   Packs/day: 2.00   Years: 10.00   Pack years: 20.00   Types: Cigarettes   Quit date: 07/18/1975   Years since quitting: 45.4  Smokeless Tobacco Never   BP Readings from Last 3 Encounters:  12/20/20 130/74  11/08/20 134/60  06/18/20 (!) 142/78   Pulse Readings from Last 3 Encounters:  12/20/20 (!) 50  11/08/20 64  06/17/20 (!)  78   Wt Readings from Last 3 Encounters:  12/20/20 194 lb (88 kg)  11/08/20 199 lb 3.2 oz (90.4 kg)  07/02/20 196 lb (88.9 kg)   BMI Readings from Last 3 Encounters:  12/20/20 30.38 kg/m  11/08/20 31.20 kg/m  07/02/20 30.70 kg/m   Assessment/Interventions: Review of patient past medical history, allergies, medications, health status, including review of consultants reports, laboratory and other test data, was performed as part of comprehensive evaluation and provision of chronic care management services.   SDOH:  (Social Determinants of Health) assessments and interventions performed: Yes  SDOH Screenings   Alcohol Screen: Low Risk    Last Alcohol Screening Score (AUDIT): 2  Depression (PHQ2-9): Medium Risk   PHQ-2 Score: 7  Financial Resource Strain: Low Risk    Difficulty of Paying Living Expenses: Not hard at all  Food Insecurity: No Food Insecurity   Worried About Charity fundraiser in the Last Year: Never true   Ran Out of Food in the Last Year: Never true  Housing: Low  Risk    Last Housing Risk Score: 0  Physical Activity: Inactive   Days of Exercise per Week: 0 days   Minutes of Exercise per Session: 0 min  Social Connections: Engineer, building services of Communication with Friends and Family: More than three times a week   Frequency of Social Gatherings with Friends and Family: Once a week   Attends Religious Services: 1 to 4 times per year   Active Member of [employer] or Organizations: Yes   Attends Archivist Meetings: 1 to 4 times per year   Marital Status: Married  Stress: No Stress Concern Present   Feeling of Stress : Not at all  Tobacco Use: Medium Risk   Smoking Tobacco Use: Former   Smokeless Tobacco Use: Never  Transportation Needs: No Data processing manager (Medical): No   Lack of Transportation (Non-Medical): No    CCM Care Plan  Allergies  Allergen Reactions   Other Other (See Comments)    DUST-Other reaction(s): Respiratory distress    Medications Reviewed Today     Reviewed by Binnie Rail, MD (Physician) on 12/19/20 at [place] List Status: <None>   Medication Order Taking? Sig Documenting Provider Last Dose Status Informant  acetaminophen (TYLENOL) 650 MG CR tablet [id number]  Take 650 mg by mouth in the morning and at bedtime. Pt takes 2 tabs 2 times daily [provider]  Active   allopurinol (ZYLOPRIM) 300 MG tablet [id number]  Take 1 tablet (300 mg total) by mouth every evening. For gout Binnie Rail, MD  Active   apixaban (ELIQUIS) 5 MG TABS tablet [id number]  Take 1 tablet (5 mg total) by mouth 2 (two) times daily. Lelon Perla, MD  Active   atorvastatin (LIPITOR) 20 MG tablet [id number]  Take 1 tablet (20 mg total) by mouth daily. For cholesterol Binnie Rail, MD  Active   bimatoprost (LUMIGAN) 0.01 % SOLN [id number]  Place 1 drop into both eyes at bedtime.  [provider]  Active Self  Biotin 5000 MCG TABS [id number]  Take 5,000 mcg by mouth daily.  [provider]  Active Self  calcium-vitamin D (OSCAL WITH D) 500-200 MG-UNIT tablet [id number]  Take 1 tablet by mouth daily with breakfast. [provider]  Active Self  colchicine 0.6 MG tablet [id number]  Take 0.6 mg by mouth daily as needed (flareups).  [provider]  Active Self  Continuous Blood Gluc Receiver (FREESTYLE LIBRE 14 DAY READER) DEVI [id number]  UAD to check sugars.  E11.65 Binnie Rail, MD  Active   Continuous Blood Gluc Sensor (FREESTYLE LIBRE 14 DAY SENSOR) [place] [id number]  UAD to check sugars, E11.65 Binnie Rail, MD  Active   dapagliflozin propanediol (FARXIGA) 10 MG TABS tablet [id number]  Take 1 tablet (10 mg total) by mouth daily before breakfast. For diabetes Binnie Rail, MD  Active   diltiazem (CARDIZEM CD) 300 MG 24 hr capsule [id number]  TAKE ONE CAPSULE BY MOUTH DAILY Stanford Breed Denice Bors, MD  Active   diltiazem (CARDIZEM) 30 MG tablet [id number]  Take 1 tablet (30 mg total) by mouth 2 (two) times daily as needed (sustained elevated heart rates >100 bpm for > 20 minutes.). Darreld Mclean, PA-C  Active   FLUoxetine (PROZAC) 40 MG capsule [id number]  Take 1 capsule (40 mg total) by mouth daily. For depression Binnie Rail, MD  Active   glucose blood test strip [id number]  Use to check blood sugar daily. E11.9 Binnie Rail, MD  Active   hydrochlorothiazide (HYDRODIURIL) 25 MG tablet [id number]  Take 1 tablet (25 mg total) by mouth daily. For blood pressure Binnie Rail, MD  Active   Lancets MISC [id number]  Use to check blood sugars daily. E11.9 Binnie Rail, MD  Active   levothyroxine (SYNTHROID) 112 MCG tablet [id number]  Take 1 tablet (112 mcg total) by mouth daily before breakfast. For thyroid Binnie Rail, MD  Active   LORazepam (ATIVAN) 0.5 MG tablet [id number]  TAKE ONE TABLET BY MOUTH TWICE A DAY AS NEEDED FOR ANXIETY Burns, Claudina Lick, MD  Active   magnesium oxide (MAG-OX) 400 MG tablet [id number]  Take 400 mg by mouth daily.  [provider]  Active Self           Med Note Spero Curb, GREG A   Sat Mar 07, 2016 11:01 PM)    Melatonin 5 MG TABS [id number]  Take 10 mg by mouth at bedtime. [provider]  Active Self  metFORMIN (GLUCOPHAGE-XR) 750 MG 24 hr tablet [id number]  Take 1 tablet (750 mg total) by mouth in the morning and at bedtime. For diabetes Binnie Rail, MD  Active   mometasone (NASONEX) 50 MCG/ACT nasal spray [id number]  Place 2 sprays into the nose as needed (allergies). Binnie Rail, MD  Active   Multiple Vitamin (MULTIVITAMIN) tablet [id number]  Take 1 tablet by mouth daily. [provider]  Active Self  nitroGLYCERIN (NITROSTAT) 0.4 MG SL tablet [id number]  Place 1 tablet (0.4 mg total) under the tongue every 5 (five) minutes as needed for chest pain. Barrett, Evelene Croon, PA-C  Active Self  omeprazole (PRILOSEC) 20 MG capsule [id number]  TAKE ONE CAPSULE BY MOUTH EVERYDAY FOR HEARTBURN Burns, Claudina Lick, MD  Active   potassium chloride SA (KLOR-CON M20) 20 MEQ tablet [id number]  Take 1 tablet (20 mEq total) by mouth daily. For low potassium Binnie Rail, MD  Active   RESTASIS 0.05 % ophthalmic emulsion [id number]  Place 1 drop into both eyes 2 (two) times daily.  [provider]  Active Self  sildenafil (REVATIO) 20 MG tablet [id number]  Take 40-100 mg by mouth daily as needed. [provider]  Active Self  silodosin (RAPAFLO) 8 MG CAPS capsule [id number]  Take 8 mg by mouth daily. [provider]  Active   solifenacin (VESICARE) 10 MG tablet [id number]  Take 10 mg by mouth daily. [provider]  Active   tamsulosin (FLOMAX) 0.4 MG CAPS capsule [id number]  Take 0.4 mg by mouth daily. [provider]  Active   vitamin C (ASCORBIC ACID) 500 MG tablet [id number]  Take 500 mg by mouth 2 (two) times daily.  [provider]  Active Self  VITAMIN D, CHOLECALCIFEROL, PO [id number]  Take 25 mcg by mouth daily.  [provider]  Active Self             Patient Active Problem List   Diagnosis Date Noted   Thrombocytopenia ([place]) 12/20/2020   Urinary frequency 12/20/2020   Memory difficulties 12/19/2020   Anxiety 06/12/2020   Recurrent falls 03/25/2020   SDH (subdural hematoma) ([place]) 03/21/2020   Intracranial hemorrhage ([place]) 03/14/2020   Fall at home, subsequent encounter 03/14/2020   Leukocytosis 03/14/2020   Nausea 09/13/2019   Rotator cuff arthropathy of left shoulder 07/21/2019   Dupuytren's disease of palm 07/12/2019   Sebaceous cyst 06/27/2019   COVID-19 05/08/2019   Acute encephalopathy 05/08/2019   Obstructive sleep apnea    Asymmetrical left sensorineural hearing loss 07/12/2018   Chronic anticoagulation 12/21/2017   Compression fracture of thoracic vertebra (HCC) 09/21/2017   Degeneration of thoracic intervertebral disc 09/21/2017   Thoracic back pain 08/10/2017   Degenerative joint disease of hand 07/14/2017   Osteoarthritis    Hypoxia    Fibromyalgia    Fatty tumor    Cataract    BPH (benign prostatic hyperplasia)    Chronic atrial fibrillation (HCC)    Insomnia 03/10/2017   Chronic pain, legs and back 03/10/2017   Hyperlipidemia 03/10/2017   Obesity (BMI 30.0-34.9) 12/08/2016   Depression 09/07/2016   Spondylolisthesis 09/07/2016   Hypothyroidism 03/08/2016   GERD (gastroesophageal reflux disease) 03/08/2016   Gout 03/08/2016   Glaucoma 03/08/2016   Type 2 diabetes mellitus ([place]) 03/08/2016   Hereditary and idiopathic peripheral neuropathy 08/28/2015   Essential hypertension 12/04/2011   Allergic rhinitis, seasonal 12/04/2011   COPD (chronic obstructive pulmonary disease) ([place]) 07/18/2011    Immunization History  Administered Date(s) Administered   Fluad Quad(high Dose 65+) 03/25/2020   Hepatitis A 07/25/2007, 01/22/2008   Hepatitis B 04/30/1988, 06/02/1988, 10/28/1988   IPV 05/15/1996   Influenza Split 02/19/2012, 03/13/2016   Influenza Whole 02/17/2011, 03/13/2013   Influenza,  High Dose Seasonal PF 03/10/2017, 02/15/2018, 02/09/2019   Influenza,inj,Quad PF,6+ Mos 03/15/2015   Influenza,inj,quad, With Preservative 02/14/2019   Meningococcal Polysaccharide 05/15/1996   Moderna Sars-Covid-2 Vaccination 07/15/2019, 08/12/2019, 02/15/2020   Pneumococcal Conjugate-13 12/08/2016   Pneumococcal-Unspecified 02/01/2006   Td 01/14/1995   Tdap 03/06/2018   Zoster Recombinat (Shingrix) 01/15/2017, 01/04/2018   Zoster, Live 02/01/2006    Conditions to be addressed/monitored:  Hypertension, Hyperlipidemia, Diabetes, Atrial Fibrillation, Anxiety, Overactive Bladder, and BPH, Insomnia, Fibromyalgia  Care Plan : CCM Pharmacy Care Plan  Updates made by [name], [place] since 12/26/2020 12:00 AM     Problem: Hypertension, Hyperlipidemia, Diabetes, Anxiety, Overactive Bladder, BPH and Gout, Insomnia   Priority: High     Long-Range Goal: Disease management   Start Date: 07/31/2020  Expected End Date: 01/28/2021  This Visit's Progress: On track  Recent Progress: On track  Priority: High  Note:   Current Barriers:  Unable to independently monitor therapeutic efficacy Unable to maintain control of insomnia/mood  Pharmacist Clinical Goal(s):  Patient will achieve adherence to monitoring guidelines and medication adherence to achieve therapeutic efficacy maintain control of insomnia/mood as evidenced by patient  report  through collaboration with PharmD and provider.   Interventions: 1:1 collaboration with Binnie Rail, MD regarding development and update of comprehensive plan of care as evidenced by provider attestation and co-signature Inter-disciplinary care team collaboration (see longitudinal plan of care) Comprehensive medication review performed; medication list updated in electronic medical record  Hypertension / AFIB (BP goal < 140/90) Controlled - BP at goal/close to goal in recent office visits CHADSVASC = 6. Hx embolic stroke off of Eliquis in 2019.  Hx SDH after fall on Eliquis (last 03/2020).  Current regimen:  HCTZ 25 mg daily Diltiazem CD 300 mg daily Diltiazem IR 30 mg PRN HR > 100 Eliquis 5 mg BID Interventions: Discussed BP goals and benefits of medications for prevention of heart attack / stroke Recommend to continue current medication   Diabetes (A1c goal < 7.5%) Not ideally controlled - A1c is above goal and pt cannot afford [place] or Rybelsus; he did pick up [place] but not Rybelsus Current regimen:  Metformin ER 750 mg twice a day Farxiga 10 mg daily Rybelsus 3 mg daily - not started Testing supplies Interventions: Pursue patient assistance for Rybelsus, Farxiga. Pt will bring income documents to office and sign forms. Provided sample for Rybelsus - pt will pick up from office.  Overactive bladder / BPH Not ideally controlled - pt reports increased urinary frequency lately; he is not sure if solifenacin is helping anymore  Current regimen:  Solifenancin 10 mg daily Silodosin 8 mg daily Previous med trials: tamslosin, doxasozin Interventions: Discussed anticholinergic effects from solifenacin may exacerbate memory problems; pt is not sure how much solifenancin is helping anymore;  Recommend trial off of solifenacin for 1-2 weeks, assess whether it is helping with urinary frequency; if no change, d/c medication; may consider Gemtesa for OAB symptoms in future   Anxiety / Insomnia Mild improvement  - pt reports some improvement in mood on higher dose of fluoxetine; still struggling with sleep Current regimen:  Fluoxetine 40 mg daily Melatonin 5 mg - 2 tab HS (10 mg) Lorazepam 0.5 mg HS prn Past tried/failed medications: bupropion, sertraline, Belsomra, trazodone, duloxetine, buspar, ambien, sonata Interventions: Discussed benefits/risks associated with lorazepam including link with dementia; pt has had falls in the past due to polypharmacy/sleep medications, so advised him to use this sparingly and at bedtime  only  Memory issues (Goal: prevent progression) -Uncontrolled - pt reports short term memory has worsened; he has neuropsychiatric testing scheduled this week; he wants to know if any medications may be contributing -Discussed lorazepam has been linked with cognitive dysfunction/dementia over years of use -Discussed anticholinergic drugs (solifenacin) can exacerbate memory issues as well Plan:  -Use lorazepam as sparingly as possible  -Trial off of solifenacin (see above)  Fibromyalgia (Goal: manage pain) -Improving - pt reports improvement in pain after starting Pregabalin; he reports 1 episode of dizziness, cognitive issues for a few hours on 7/12 after starting pregabalin, but his symptoms resolved and he feels almost back to normal today; -Current treatment  Pregabalin 75 mg BID Fluoxetine 40 mg daily -Given improvement in pain and resolution of possible side effects over last few days, advised pt to continue pregabalin as prescribed  Patient Goals/Self-Care Activities Patient will:  - take medications as prescribed -focus on medication adherence by pill box (medication cups) -Continue Pregabalin 75 mg BID -Trial off of solifenacin 10 mg due to potential to worsen memory; monitor urinary symptoms -Collaborate with provider on medication access solutions (pick up Rybelsus samples, provide income for pt  assistance forms, sign forms at providers office)      Medication Assistance: None required.  Patient affirms current coverage meets needs.  Compliance/Adherence/Medication fill history: Care Gaps: Covid booster (due 06/16/20) Dexa scan (due 10/15/19) Eye exam (due 02/01/20) Foot exam (due 04/25/20)  Star-Rating Drugs: Atorvastatin - LF 12/17/20 Farxiga - LF 12/20/20 x 90 ds Rybelsus - LF 12/24/20 x 30 ds  Metformin - LF 08/19/20 x 90 ds  Patient's preferred pharmacy is:  [facility] [id number], [name]  [address] Phone: [phone] Fax: [phone]  [id number] [address] [id number]: [phone]: [phone] Uses pill box? Yes Pt endorses 90% compliance  We discussed: Current pharmacy is preferred with insurance plan and patient is satisfied with pharmacy services Patient decided to: Continue current medication management strategy  Care Plan and Follow Up Patient Decision:  Patient agrees to Care Plan and Follow-up.  Plan: Telephone follow up appointment with care management team member scheduled for:  3 months  LCharlene Brooke PharmD, [place] CPP Clinical Pharmacist [phone]

## 2020-12-26 NOTE — Patient Instructions (Addendum)
Visit Information  Phone number for Pharmacist: [phone]   Goals Addressed             This Visit's Progress    Manage My Medicine       Timeframe:  Long-Range Goal Priority:  High Start Date:        12/26/20                     Expected End Date:    06/28/21                   Follow Up Date Oct 2022   - call for medicine refill 2 or 3 days before it runs out - call if I am sick and can't take my medicine - keep a list of all the medicines I take; vitamins and herbals too - use a pillbox to sort medicine  -Continue Pregabalin 75 mg BID -Trial off of solifenacin 10 mg due to potential to worsen memory; monitor urinary symptoms -Collaborate with provider on medication access solutions (pick up Rybelsus samples, provide income for pt assistance forms, sign forms at providers office)   Why is this important?   These steps will help you keep on track with your medicines.   Notes:        Patient verbalizes understanding of instructions provided today and agrees to view in [place].  Telephone follow up appointment with pharmacy team member scheduled for: 3 months  Charlene Brooke, PharmD, [place], CPP Clinical Pharmacist [place] [facility] at [facility] [phone]

## 2020-12-27 ENCOUNTER — Ambulatory Visit (INDEPENDENT_AMBULATORY_CARE_PROVIDER_SITE_OTHER): Payer: Medicare Other | Admitting: Psychology

## 2020-12-27 ENCOUNTER — Encounter: Payer: Self-pay | Admitting: Psychology

## 2020-12-27 ENCOUNTER — Telehealth: Payer: Self-pay | Admitting: *Deleted

## 2020-12-27 ENCOUNTER — Ambulatory Visit: Payer: Medicare Other | Admitting: Psychology

## 2020-12-27 DIAGNOSIS — F411 Generalized anxiety disorder: Secondary | ICD-10-CM

## 2020-12-27 DIAGNOSIS — G3184 Mild cognitive impairment, so stated: Secondary | ICD-10-CM

## 2020-12-27 DIAGNOSIS — G4733 Obstructive sleep apnea (adult) (pediatric): Secondary | ICD-10-CM

## 2020-12-27 DIAGNOSIS — R4189 Other symptoms and signs involving cognitive functions and awareness: Secondary | ICD-10-CM

## 2020-12-27 DIAGNOSIS — M797 Fibromyalgia: Secondary | ICD-10-CM | POA: Diagnosis not present

## 2020-12-27 DIAGNOSIS — F330 Major depressive disorder, recurrent, mild: Secondary | ICD-10-CM

## 2020-12-27 DIAGNOSIS — S065XAA Traumatic subdural hemorrhage with loss of consciousness status unknown, initial encounter: Secondary | ICD-10-CM

## 2020-12-27 DIAGNOSIS — S065X9A Traumatic subdural hemorrhage with loss of consciousness of unspecified duration, initial encounter: Secondary | ICD-10-CM | POA: Diagnosis not present

## 2020-12-27 HISTORY — DX: Mild cognitive impairment of uncertain or unknown etiology: G31.84

## 2020-12-27 MED ORDER — LORAZEPAM 0.5 MG PO TABS: ORAL_TABLET | ORAL | 0 refills | Status: DC

## 2020-12-27 NOTE — Progress Notes (Signed)
   Psychometrician Note   Cognitive testing was administered to Jonathan [name] by Milana Kidney, B.S. (psychometrist) under the supervision of Dr. Christia Reading, Ph.D., licensed psychologist on 12/27/20. Jonathan [name] did not appear overtly distressed by the testing session per behavioral observation or responses across self-report questionnaires. Rest breaks were offered.    The battery of tests administered was selected by Dr. Christia Reading, Ph.D. with consideration to Jonathan [name] current level of functioning, the nature of his symptoms, emotional and behavioral responses during interview, level of literacy, observed level of motivation/effort, and the nature of the referral question. This battery was communicated to the psychometrist. Communication between Dr. Christia Reading, Ph.D. and the psychometrist was ongoing throughout the evaluation and Dr. Christia Reading, Ph.D. was immediately accessible at all times. Dr. Christia Reading, Ph.D. provided supervision to the psychometrist on the date of this service to the extent necessary to assure the quality of all services provided.    Jonathan [name] will return within approximately 1-2 weeks for an interactive feedback session with Dr. Melvyn Novas at which time his test performances, clinical impressions, and treatment recommendations will be reviewed in detail. Jonathan [name] understands he can contact our office should he require our assistance before this time.  A total of 125 minutes of billable time were spent face-to-face with Jonathan [name] by the psychometrist. This includes both test administration and scoring time. Billing for these services is reflected in the clinical report generated by Dr. Christia Reading, Ph.D.  This note reflects time spent with the psychometrician and does not include test scores or any clinical interpretations made by Dr. Melvyn Novas. The full report will follow in a separate note.

## 2020-12-27 NOTE — Progress Notes (Signed)
NEUROPSYCHOLOGICAL EVALUATION [place] Department of Neurology  Date of Evaluation: December 27, 2020  Reason for Referral:   Jonathan [name] is a 87 y.o. Caucasian male referred by Billey Gosling, M.[name]., to characterize his current cognitive functioning and assist with diagnostic clarity and treatment planning in the context of subjective cognitive decline and several medical and psychiatric comorbidities.  Assessment and Plan:   Clinical Impression(s): Jonathan [name] pattern of performance is suggestive of significant performance variability across numerous cognitive domains including processing speed, executive functioning, confrontation naming, visuospatial abilities, and learning and memory. No consistent impairments were exhibited. Performances were below average across attention/concentration, while generally appropriate across receptive language and verbal fluency. Jonathan [name] denied difficulties completing instrumental activities of daily living (ADLs) independently. Jonathan [name] may be best characterized as having mild cognitive impairment. However, I cannot be sure of an underlying neurological etiology at the present time, making a formal diagnosis of a mild neurocognitive disorder inappropriate at the present time.   As mentioned above, variability was notable. As an example, scores across memory tasks ranged from the exceptionally low normative range (1st percentile; short delay recall during a list learning task) to the exceptionally high normative range (99th percentile; delays of both story and shape learning tasks). While this represents the widest performance gap across his evaluation (as well as the widest possible gap in general), other gaps were regularly 60-80 percentile points. Given this extreme degree of variability, discernable patterns across cognitive testing are quite minimal at the present time. Relative to his previous evaluation in September 2020,  there was a large degree of stability when looking by overall domain. While variable, visuospatial abilities did appear to exhibit a mild decline, as did cognitive flexibility. However, outside of this, a consistent decline between the two evaluations cannot be discerned.   The etiology of subjective dysfunction and decline continues to be unclear. Retention scores across memory measures ranged from 50-300% and while he may have a more pronounced weakness retrieving information upon demand, his scores do not suggest rapid forgetting or a memory storage deficit and are inconsistent with Alzheimer's disease. He does not demonstrate behavioral features concerning for Parkinson's disease, Lewy body dementia, or frontotemporal dementia at the present time either. Relative to most recent neuroimaging, his right-sided intraventricular hemorrhage and bilateral frontal lobe subdural hygromas sustained during a fall this past October could offer an explanation for visuospatial and cognitive flexibility decline, as well as greater subjective dysfunction. Additional explanations for experienced day-to-day cognitive difficulties continued to include ongoing psychiatric distress (i.e., mild levels of anxiety and depression) and sleep dysfunction, longstanding traits of ADHD (despite not warranting a diagnosis), chronic pain, and neuroimaging suggesting chronic small vessel ischemic changes in the cerebral white matter and history of a remote subdural hematoma. Continued medical monitoring will be important moving forward.   Recommendations: A repeat neuropsychological evaluation in 18-24 months (or sooner if functional decline is noted) is recommended to assess the trajectory of future cognitive decline should it occur. This will also aid in future efforts towards improved diagnostic clarity.  Jonathan [name] reported consuming 2-3 shots of vodka/bourbon on a nightly basis to help him fall asleep. It is important to realize  that while this may help him in falling asleep, the quality of obtained sleep will be poorer and these actions are likely contributing to ongoing sleep dysfunction. I would also very strongly advise against his report of frequently mixing of alcohol and oxycodone due to severe health risks.  Additionally, the CDC defines heavy drinking for men as 15 or more drinks in a week. Based upon this, Jonathan [name] is likely exceeding this amount of alcohol intake solely in his efforts to fall asleep. Chronic heavy drinking will worsen not only various medical conditions, but also cognitive abilities and place him at an increased risk for alcohol-related dementia presentations. I would recommend that he decrease his alcohol intake and discuss alternative sleep-aids with his medical team.   Jonathan [name] is encouraged to attend to lifestyle factors for brain health (e.g., regular physical exercise, good nutrition habits, regular participation in cognitively-stimulating activities, and general stress management techniques), which are likely to have benefits for both emotional adjustment and cognition. Optimal control of vascular risk factors (including safe cardiovascular exercise and adherence to dietary recommendations) is encouraged. Likewise, continued compliance with his CPAP machine will also be important.   When learning new information, he would benefit from information being broken up into small, manageable pieces. He may also find it helpful to articulate the material in his own words and in a context to promote encoding at the onset of a new task. This material may need to be repeated multiple times to promote encoding.  Memory can be improved using internal strategies such as rehearsal, repetition, chunking, mnemonics, association, and imagery. External strategies such as written notes in a consistently used memory journal, visual and nonverbal auditory cues such as a calendar on the refrigerator or appointments  with alarm, such as on a cell phone, can also help maximize recall.    To address problems with processing speed, he may wish to consider:   -Ensuring that he is alerted when essential material or instructions are being presented   -Adjusting the speed at which new information is presented   -Allowing for more time in comprehending, processing, and responding in conversation  To address problems with fluctuating attention, he may wish to consider:   -Avoiding external distractions when needing to concentrate   -Limiting exposure to fast paced environments with multiple sensory demands   -Writing down complicated information and using checklists   -Attempting and completing one task at a time (i.e., no multi-tasking)   -Verbalizing aloud each step of a task to maintain focus   -Taking frequent breaks during the completion of steps/tasks to avoid fatigue   -Reducing the amount of information considered at one time  Review of Records:   Jonathan [name] completed a comprehensive neuropsychological evaluation Kandis Nab, Psy.[name].) on 10/22/2016. At that time, results were entirely within normal limits and commensurate with estimated premorbid intellectual abilities. No impaired performances were exhibited across testing. Subjective cognitive difficulties were said to likely be related to a combination of his various medical and psychiatric conditions.    Jonathan [name] was seen by [facility] Billey Gosling, M.[name].) on 02/09/2019 for follow-up of several medical and psychiatric conditions including type II diabetes, atrial fibrillation, hypertension, insomnia, hypothyroidism, hyperlipidemia, gout, chronic back pain, arthritis, GERD, and subjective cognitive difficulties. Regarding the latter, these were said to focus on worsening short-term memory. As such, he was referred for a comprehensive neuropsychological evaluation to characterize his cognitive abilities and to assist with diagnostic  clarity and future treatment planning.  He completed a second neuropsychological evaluation with myself on 02/15/2019. Results at that time continued to suggest neuropsychological functioning largely within normal limits. However, notable performance variability was exhibited across domains of processing speed, attention/concentration, verbal fluency, and executive functioning. Performance across domains of receptive language, confrontation naming, visuospatial  functioning, and learning and memory were within normal limits. Relative to his 2018 neuropsychological evaluation, performance decline was exhibited across domains of processing speed, complex attention, and executive functioning. Likewise, performance on a list learning verbal memory task also declined; however, other memory performances were stable and well within normal limits. Possible reasons for exhibited cognitive decline and experienced day-to-day cognitive difficulties included ongoing psychiatric distress (i.e., moderate levels of reported anxiety and mild levels of reported depression), longstanding traits of ADHD (despite not warranting a diagnosis), neuroimaging suggesting chronic small vessel ischemic changes in the cerebral white matter and history of subdural hematoma, medical comorbidities, and medication side effects/polypharmacy.   Jonathan [name] most recently saw Dr. Quay Burow on 12/20/2020 for follow-up. He reported extreme pain stemming from fibromyalgia, peripheral neuropathy with burning/tingling sensations in his feet, insomnia, and frequent urination. He also expressed a desire for a repeat neuropsychological evaluation due to subjective cognitive decline. He described his short-term memory as"very bad," noting that he had left the stove on 3-4 times. Ultimately, Jonathan [name] was referred for a repeat neuropsychological evaluation to characterize his cognitive abilities and to assist with diagnostic clarity and treatment planning.   Brain  MRI on 03/06/2016 revealed mild generalized atrophy and mild periventricular and subcortical small vessel ischemic disease. Brain MRI on 12/23/2016 was stable relative to prior imaging. [name] CT on 03/06/2018 revealed an anterior right parafalcine subdural hematoma without significant mass effect or midline shift in the context of a recent fall. Generalized cerebral volume loss with mild chronic small vessel ischemic changes in the cerebral white matter was also noted. Follow-up [name] CT on 04/21/2018 noted the resolution of his subdural hematoma. [name] CT on 05/08/2019 again revealed mild cortical and central atrophy, as well as mild small vessel ischemic changes. [name] CT on 03/14/2020 was stable.   [name] CT on 03/16/2020 in the context of a fall revealed a small volume intraventricular hemorrhage in the right lateral ventricle, as well as small bilateral subdural hygromas in the frontal lobes bilaterally. [name] CT on 04/01/2020 revealed interval resolution of the intraventricular hemorrhage and near complete resolution of bilateral subdural fluid collection. The small residual fluid collections in the bilateral parietal measured up to 1mm.  Past Medical History:  Diagnosis Date   Acute encephalopathy 05/08/2019   Allergic rhinitis, seasonal 12/04/2011   Arthritis of hand 11/08/2020   BPH (benign prostatic hyperplasia)    Bronchitis    Cataract    Chronic anticoagulation 12/21/2017   CHADS VASC=4, Eliquis stopped Sept 2021- recurrent falls with SDH   Chronic atrial fibrillation    On Eliquis   Chronic pain, legs and back 03/10/2017   Preferred pain management Leg and back pain   Compression fracture of thoracic vertebra 09/21/2017   COPD (chronic obstructive pulmonary disease)    2 liters O2 HS   Degeneration of thoracic intervertebral disc 09/21/2017   Degenerative joint disease of hand 07/14/2017   Dupuytren's disease of palm 07/12/2019   Essential hypertension 12/04/2011   Fatty tumor    waste and  back   Fibromyalgia    Generalized anxiety disorder 06/12/2020   12/21 He is reporting severe insomnia and anxiety symptoms.  Options are limited.  We will try a low-dose lorazepam at night and during the day for anxiety and panic attacks.  He will stop lorazepam if problems.  He will stop drinking alcohol.  Discontinue BuSpar.  Reduce Cymbalta to 1 a day.   GERD (gastroesophageal reflux disease)    Glaucoma 03/08/2016  Gout 03/08/2016   Hereditary and idiopathic peripheral neuropathy 08/28/2015   History of COVID-19 05/08/2019   2021 Post-COVID sx's -" brain fog" Try Lion's mane supplement and B complex with niacin for neuropathy   Hyperlipidemia 03/10/2017   Hypothyroidism    Hypoxia    Insomnia    Intracranial hemorrhage    Leukocytosis 03/14/2020   Major depressive disorder 09/07/2016   Obstructive sleep apnea    Osteoarthritis    Osteoporosis    Recurrent falls 03/25/2020   PT. Treat neuropathy, insomnia.  Reduce Cymbalta to 1 a day.  Discontinue BuSpar.   Rotator cuff arthropathy of left shoulder 07/21/2019   SDH (subdural hematoma) 03/21/2020   Recurrent SDH secondary to falls at home- Sept 2019 and again 03/14/2020- Eliquis stopped.   Sebaceous cyst 06/27/2019   Spinal compression fracture seventh vertebre   Spondylolisthesis 09/07/2016   On fosamax - for about one year - Dr Alyson Ingles  10/14/17 dexa: normal dexa -- spine 2.1,   RFN -0.9,  LFN   -0.7   - no comparison on file, previous fracture      Thoracic back pain 08/10/2017   Thrombocytopenia 12/20/2020   TIA (transient ischemic attack) 08/28/2015   Type 2 diabetes mellitus 03/08/2016    Past Surgical History:  Procedure Laterality Date   APPENDECTOMY  age 57   Sarepta Right    early 2000s   CATARACT EXTRACTION     bilateral   CHOLECYSTECTOMY  age 28   EYE SURGERY     FOOT ARTHRODESIS Right 02/02/2013   Procedure: RIGHT HALLUX METATARSAL PHALANGEAL JOINT ARTHRODESIS ;  Surgeon: Wylene Simmer, MD;   Location: [place];  Service: Orthopedics;  Laterality: Right;   INGUINAL HERNIA REPAIR  age 70   rt side   NASAL CONCHA BULLOSA RESECTION  age 41   PROSTATE SURGERY     SHOULDER ARTHROSCOPY W/ ROTATOR CUFF REPAIR Right    early 2000s   tonsil     VASECTOMY  age 12    Current Outpatient Medications:    acetaminophen (TYLENOL) 650 MG CR tablet, Take 650 mg by mouth in the morning and at bedtime. Pt takes 2 tabs 2 times daily, Disp: , Rfl:    allopurinol (ZYLOPRIM) 300 MG tablet, Take 1 tablet (300 mg total) by mouth every evening. For gout, Disp: 90 tablet, Rfl: 3   apixaban (ELIQUIS) 5 MG TABS tablet, Take 1 tablet (5 mg total) by mouth 2 (two) times daily., Disp: 180 tablet, Rfl: 1   atorvastatin (LIPITOR) 20 MG tablet, Take 1 tablet (20 mg total) by mouth daily. For cholesterol, Disp: 90 tablet, Rfl: 1   bimatoprost (LUMIGAN) 0.01 % SOLN, Place 1 drop into both eyes at bedtime. , Disp: , Rfl:    Biotin 5000 MCG TABS, Take 5,000 mcg by mouth daily., Disp: , Rfl:    calcium-vitamin [name] (OSCAL WITH [name]) 500-200 MG-UNIT tablet, Take 1 tablet by mouth daily with breakfast., Disp: , Rfl:    colchicine 0.6 MG tablet, Take 0.6 mg by mouth daily as needed (flareups). , Disp: , Rfl:    Continuous Blood Gluc Receiver (FREESTYLE LIBRE 14 DAY READER) DEVI, UAD to check sugars.  E11.65, Disp: 1 each, Rfl: 0   Continuous Blood Gluc Sensor (FREESTYLE LIBRE 14 DAY SENSOR) MISC, UAD to check sugars, E11.65, Disp: 2 each, Rfl: 5   dapagliflozin propanediol (FARXIGA) 10 MG TABS tablet, Take 1 tablet (10 mg total) by mouth daily before breakfast. For  diabetes, Disp: 30 tablet, Rfl: 5   diltiazem (CARDIZEM CD) 300 MG 24 hr capsule, TAKE ONE CAPSULE BY MOUTH DAILY, Disp: 90 capsule, Rfl: 3   diltiazem (CARDIZEM) 30 MG tablet, Take 1 tablet (30 mg total) by mouth 2 (two) times daily as needed (sustained elevated heart rates >100 bpm for > 20 minutes.)., Disp: 30 tablet, Rfl: 2   FLUoxetine (PROZAC) 40  MG capsule, Take 1 capsule (40 mg total) by mouth daily. For depression, Disp: 90 capsule, Rfl: 3   glucose blood test strip, Use to check blood sugar daily. E11.9, Disp: 100 each, Rfl: 3   hydrochlorothiazide (HYDRODIURIL) 25 MG tablet, Take 1 tablet (25 mg total) by mouth daily. For blood pressure, Disp: 90 tablet, Rfl: 3   Lancets MISC, Use to check blood sugars daily. E11.9, Disp: 100 each, Rfl: 3   levothyroxine (SYNTHROID) 112 MCG tablet, Take 1 tablet (112 mcg total) by mouth daily before breakfast. For thyroid, Disp: 90 tablet, Rfl: 1   LORazepam (ATIVAN) 0.5 MG tablet, TAKE ONE TABLET BY MOUTH TWICE A DAY AS NEEDED FOR ANXIETY, Disp: 30 tablet, Rfl: 0   magnesium oxide (MAG-OX) 400 MG tablet, Take 400 mg by mouth daily., Disp: , Rfl:    Melatonin 5 MG TABS, Take 10 mg by mouth at bedtime., Disp: , Rfl:    metFORMIN (GLUCOPHAGE-XR) 750 MG 24 hr tablet, Take 1 tablet (750 mg total) by mouth in the morning and at bedtime. For diabetes, Disp: 180 tablet, Rfl: 1   mometasone (NASONEX) 50 MCG/ACT nasal spray, Place 2 sprays into the nose as needed (allergies)., Disp: 17 g, Rfl: 5   Multiple Vitamin (MULTIVITAMIN) tablet, Take 1 tablet by mouth daily., Disp: , Rfl:    nitroGLYCERIN (NITROSTAT) 0.4 MG SL tablet, Place 1 tablet (0.4 mg total) under the tongue every 5 (five) minutes as needed for chest pain., Disp: 90 tablet, Rfl: 3   omeprazole (PRILOSEC) 20 MG capsule, TAKE ONE CAPSULE BY MOUTH EVERYDAY FOR HEARTBURN, Disp: 90 capsule, Rfl: 1   potassium chloride SA (KLOR-CON M20) 20 MEQ tablet, Take 1 tablet (20 mEq total) by mouth daily. For low potassium, Disp: 90 tablet, Rfl: 1   pregabalin (LYRICA) 75 MG capsule, Take 1 capsule (75 mg total) by mouth 2 (two) times daily., Disp: 60 capsule, Rfl: 0   RESTASIS 0.05 % ophthalmic emulsion, Place 1 drop into both eyes 2 (two) times daily. , Disp: , Rfl:    Semaglutide (RYBELSUS) 3 MG TABS, Take 3 mg by mouth daily after breakfast., Disp: 30 tablet,  Rfl: 3   sildenafil (REVATIO) 20 MG tablet, Take 40-100 mg by mouth daily as needed., Disp: , Rfl:    silodosin (RAPAFLO) 8 MG CAPS capsule, Take 8 mg by mouth daily., Disp: , Rfl:    solifenacin (VESICARE) 10 MG tablet, Take 10 mg by mouth daily., Disp: , Rfl:    vitamin C (ASCORBIC ACID) 500 MG tablet, Take 500 mg by mouth 2 (two) times daily. , Disp: , Rfl:    VITAMIN [name], CHOLECALCIFEROL, PO, Take 25 mcg by mouth daily. , Disp: , Rfl:   Clinical Interview:   The following information was obtained during a clinical interview with Mr. Dillion during his previous evaluation in 2020. Sections were updated with recent information where appropriate.  Cognitive Symptoms: Decreased short-term memory: Endorsed. Provided examples included trouble remembering details of previous conversations, forgetting to complete previously started tasks, and trouble remembering the names of familiar individuals.  Decreased long-term  memory: Denied.  Decreased attention/concentration: Endorsed. While never formally diagnosed with ADHD, Mr. Bangura described longstanding traits of this condition dating back to adolescence. These include trouble maintaining his focus, ease of distractibility, starting projects and being unable to finish them, needing to re-read passages several times, and instances of impulsivity.  Reduced processing speed: Endorsed. He described periodic episodes of significant "brain fog." He also provided an example of having a "screen" between him and the rest of the world which is occasionally present and creates difficulties.  Difficulties with executive functions: Endorsed. Difficulties surrounded those common in ADHD, including disorganization, complex planning, and impulsivity. Concerns regarding judgment were denied.  Difficulties with emotion regulation: Denied. He did acknowledge that his wife had commented that he seems more sarcastic lately.  Difficulties with receptive language:  Denied. Difficulties with word finding: Endorsed. However, these were described as mild in nature and occurring somewhat. Decreased visuoperceptual ability: Endorsed. Specifically, he reported frequently bumping into things (e.g., door frames) in his environment consistently with his left shoulder/side.    Trajectory of deficits: Attention/concentration related difficulties were said to be longstanding in nature, dating back to adolescence. Additional deficits were said to first become noticeable following a concussion sustained in 2002 (see below). They were said to have exhibited a gradual decline since that time and seem more pronounced at some points in time relative to others. Relative to his prior evaluation in 2020, he reported diffuse cognitive decline. This was especially expressed surrounding short-term memory where he stated having a "touch of dementia" (despite prior evaluations suggesting that functioning was within normal limits) and that his memory had "almost disappeared" (which was not at all supported by previous testing).     Difficulties completing ADLs: Endorsed. Difficulties completing basic ADLs was denied. Regarding instrumental ADLs, he previously acknowledged rare instances in which he may forget to take a medication on time. He previously noted trouble with managing personal finances, stating that he forgot to file his income taxes or an extension request due to being unable to "sit down and do it." He reported driving without difficulty. Generally speaking, functioning was said to be stable relative to his previous evaluation.   Additional Medical History: History of traumatic brain injury/concussion: Endorsed. Mr. Wehmeyer reported experiencing a concussion after falling approximately 8 feet and hitting his [name] in 2002. He denied a loss in consciousness from this event, but did acknowledge feeling dazed and confused. Additionally, he reported falling and hitting his [name] while  attempting to wheel his wife down a ramp in September 2019. Again, he denied a loss in consciousness, but did feel dazed and confused and was briefly hospitalized. Neuroimaging suggested the presence of an anterior right parafalcine subdural hematoma without significant mass effect or midline shift. More recently, he experienced another fall in 2021 which resulted in additional small hemorrhaging (see above).  History of stroke: Denied. However, he reported experiencing "at least 2" potential TIAs in late 2019. Associated symptoms included brief periods of significantly increased brain fog which eventually subsided on its own. History of seizure activity: Denied. History of known exposure to toxins: Denied. Symptoms of chronic pain: Endorsed. Mr. Teel has been diagnosed with fibromyalgia, arthritis, and neuropathy. He reported pain levels as consistently "very high." These are currently managed via several oral medications which are generally effective. Experience of frequent headaches/migraines: Denied. Frequent instances of dizziness/vertigo: Denied.   Sensory changes: Endorsed. Mr. Wickens wears corrective lenses with positive effect. He also utilizes hearing aids to aid with hearing loss.  He noted that these were not due to a volume issue, but more of a sound distortion issue. In addition, he reported losing his sense of taste and smell a few years prior.  Balance/coordination difficulties: Endorsed. He acknowledged periodic instability and stumbling while ambulating. He was unsure of the cause of this but does experience peripheral neuropathy and chronic pain which is likely contributory. His last recognized fall was towards the end of 2021 and did result in mild hemorrhaging as mentioned above. Other motor difficulties: Denied.  Other medical conditions: He was diagnosed with COVID-19 in November 2020 and reported spending 7-10 days in the hospital. He did not require ventilation nor external  oxygen supplementation. However, he noted that the later was nearly necessary. He did state that memory and other thinking abilities seemed worse following his recovery.   Sleep History: Estimated hours obtained each night: 8-10 hours. Difficulties falling asleep: Endorsed. Mr. Remo previously reported using various pain and sleep-related medications to assist him in falling asleep. However, despite this, he continues to experience symptoms of insomnia and will sometimes lay awake for hours. In these instances, he previously reported consuming 3 ounces of "100 proof vodka or bourbon" in order to help him fall asleep. He confirmed that this was still a consistent behavior and noted that he will sometimes mix alcohol with oxycodone in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

## 2020-12-27 NOTE — Telephone Encounter (Signed)
Red paper request for renewal on Lorazepam 0.5mg  . Last filled 11/04/20. MD is out of the office until 01/06/21. Pls advise.Marland KitchenJohny Chess

## 2020-12-27 NOTE — Telephone Encounter (Signed)
Sorry I dont know what a red paper request is, but I did the refill, thanks

## 2020-12-29 ENCOUNTER — Encounter: Payer: Self-pay | Admitting: Internal Medicine

## 2020-12-29 DIAGNOSIS — K760 Fatty (change of) liver, not elsewhere classified: Secondary | ICD-10-CM

## 2020-12-29 HISTORY — DX: Fatty (change of) liver, not elsewhere classified: K76.0

## 2021-01-01 ENCOUNTER — Encounter: Payer: Medicare Other | Admitting: Psychology

## 2021-01-01 ENCOUNTER — Ambulatory Visit (INDEPENDENT_AMBULATORY_CARE_PROVIDER_SITE_OTHER): Payer: Medicare Other | Admitting: Podiatry

## 2021-01-01 ENCOUNTER — Encounter: Payer: Self-pay | Admitting: Podiatry

## 2021-01-01 ENCOUNTER — Telehealth: Payer: Self-pay | Admitting: Pharmacist

## 2021-01-01 ENCOUNTER — Other Ambulatory Visit: Payer: Self-pay

## 2021-01-01 DIAGNOSIS — M2042 Other hammer toe(s) (acquired), left foot: Secondary | ICD-10-CM

## 2021-01-01 DIAGNOSIS — M2041 Other hammer toe(s) (acquired), right foot: Secondary | ICD-10-CM

## 2021-01-01 DIAGNOSIS — E119 Type 2 diabetes mellitus without complications: Secondary | ICD-10-CM

## 2021-01-01 NOTE — Progress Notes (Deleted)
Cardiology Clinic Note   Patient Name: Jonathan [name] Date of Encounter: 01/01/2021  Primary Care Provider:  Binnie Rail, MD Primary Cardiologist:  Kirk Ruths, MD  Patient Profile    Jonathan [name] 87 year old male presents to the clinic today for follow-up evaluation of his paroxysmal atrial fibrillation and essential hypertension.  Past Medical History    Past Medical History:  Diagnosis Date   Acute encephalopathy 05/08/2019   Allergic rhinitis, seasonal 12/04/2011   Arthritis of hand 11/08/2020   BPH (benign prostatic hyperplasia)    Bronchitis    Cataract    Chronic anticoagulation 12/21/2017   CHADS VASC=4, Eliquis stopped Sept 2021- recurrent falls with SDH   Chronic atrial fibrillation    On Eliquis   Chronic pain, legs and back 03/10/2017   Preferred pain management Leg and back pain   Compression fracture of thoracic vertebra 09/21/2017   COPD (chronic obstructive pulmonary disease)    2 liters O2 HS   Degeneration of thoracic intervertebral disc 09/21/2017   Degenerative joint disease of hand 07/14/2017   Dupuytren's disease of palm 07/12/2019   Essential hypertension 12/04/2011   Fatty tumor    waste and back   Fibromyalgia    Generalized anxiety disorder 06/12/2020   12/21 He is reporting severe insomnia and anxiety symptoms.  Options are limited.  We will try a low-dose lorazepam at night and during the day for anxiety and panic attacks.  He will stop lorazepam if problems.  He will stop drinking alcohol.  Discontinue BuSpar.  Reduce Cymbalta to 1 a day.   GERD (gastroesophageal reflux disease)    Glaucoma 03/08/2016   Gout 03/08/2016   Hereditary and idiopathic peripheral neuropathy 08/28/2015   History of COVID-19 05/08/2019   2021 Post-COVID sx's -" brain fog" Try Lion's mane supplement and B complex with niacin for neuropathy   Hyperlipidemia 03/10/2017   Hypothyroidism    Hypoxia    Insomnia    Intracranial hemorrhage    Leukocytosis  03/14/2020   Major depressive disorder 09/07/2016   Obstructive sleep apnea    Osteoarthritis    Osteoporosis    Recurrent falls 03/25/2020   PT. Treat neuropathy, insomnia.  Reduce Cymbalta to 1 a day.  Discontinue BuSpar.   Rotator cuff arthropathy of left shoulder 07/21/2019   SDH (subdural hematoma) 03/21/2020   Recurrent SDH secondary to falls at home- Sept 2019 and again 03/14/2020- Eliquis stopped.   Sebaceous cyst 06/27/2019   Spinal compression fracture seventh vertebre   Spondylolisthesis 09/07/2016   On fosamax - for about one year - Dr Alyson Ingles  10/14/17 dexa: normal dexa -- spine 2.1,   RFN -0.9,  LFN   -0.7   - no comparison on file, previous fracture      Thoracic back pain 08/10/2017   Thrombocytopenia 12/20/2020   TIA (transient ischemic attack) 08/28/2015   Type 2 diabetes mellitus 03/08/2016   Past Surgical History:  Procedure Laterality Date   APPENDECTOMY  age 41   [place] Right    early 2000s   CATARACT EXTRACTION     bilateral   CHOLECYSTECTOMY  age 42   EYE SURGERY     FOOT ARTHRODESIS Right 02/02/2013   Procedure: RIGHT HALLUX METATARSAL PHALANGEAL JOINT ARTHRODESIS ;  Surgeon: Wylene Simmer, MD;  Location: [facility];  Service: Orthopedics;  Laterality: Right;   INGUINAL HERNIA REPAIR  age 26   rt side   NASAL CONCHA BULLOSA RESECTION  age 55  PROSTATE SURGERY     SHOULDER ARTHROSCOPY W/ ROTATOR CUFF REPAIR Right    early 2000s   tonsil     VASECTOMY  age 84    Allergies  Allergies  Allergen Reactions   Other Other (See Comments)    DUST-Other reaction(s): Respiratory distress    History of Present Illness    Jonathan [name] is a PMH of atrial fibrillation that spontaneously converted to normal sinus rhythm.  His PMH also includes COPD.  He had an abdominal ultrasound 3/15 that showed no aneurysm.  Nuclear stress test 7/16 showed an ejection fraction of 56% and normal perfusion.  His echocardiogram 8/18 showed normal  LV function, G2 DD, and left atrial enlargement.  He sustained a fall 9/19 which resulted in s subdural hematoma.  He was admitted September 2021 after falling and had repeat subdural hematoma.  He wore a cardiac event monitor December 2021 which showed sinus rhythm with PACs PVCs, PAF, PAT and 4 beats of NSVT.  He was last seen by Dr. Stanford Breed on 07/02/2020.  During that time he denied dyspnea, chest pain, syncope, and recent falls.  He presents the clinic today for follow-up evaluation states***  *** denies chest pain, shortness of breath, lower extremity edema, fatigue, palpitations, melena, hematuria, hemoptysis, diaphoresis, weakness, presyncope, syncope, orthopnea, and PND.   Home Medications    Prior to Admission medications   Medication Sig Start Date End Date Taking? Authorizing Provider  acetaminophen (TYLENOL) 650 MG CR tablet Take 650 mg by mouth in the morning and at bedtime. Pt takes 2 tabs 2 times daily    [provider]  allopurinol (ZYLOPRIM) 300 MG tablet Take 1 tablet (300 mg total) by mouth every evening. For gout 06/17/20   Binnie Rail, MD  apixaban (ELIQUIS) 5 MG TABS tablet Take 1 tablet (5 mg total) by mouth 2 (two) times daily. 08/09/20   Lelon Perla, MD  atorvastatin (LIPITOR) 20 MG tablet Take 1 tablet (20 mg total) by mouth daily. For cholesterol 06/17/20   Burns, Claudina Lick, MD  bimatoprost (LUMIGAN) 0.01 % SOLN Place 1 drop into both eyes at bedtime.     [provider]  Biotin 5000 MCG TABS Take 5,000 mcg by mouth daily.    [provider]  calcium-vitamin D (OSCAL WITH D) 500-200 MG-UNIT tablet Take 1 tablet by mouth daily with breakfast.    [provider]  colchicine 0.6 MG tablet Take 0.6 mg by mouth daily as needed (flareups).     [provider]  Continuous Blood Gluc Receiver (FREESTYLE LIBRE 14 DAY READER) DEVI UAD to check sugars.  E11.65 11/08/20   Binnie Rail, MD  Continuous Blood Gluc Sensor (FREESTYLE  LIBRE 14 DAY SENSOR) MISC UAD to check sugars, E11.65 11/08/20   Binnie Rail, MD  dapagliflozin propanediol (FARXIGA) 10 MG TABS tablet Take 1 tablet (10 mg total) by mouth daily before breakfast. For diabetes 11/08/20   Binnie Rail, MD  diltiazem (CARDIZEM CD) 300 MG 24 hr capsule TAKE ONE CAPSULE BY MOUTH DAILY 08/06/20   Lelon Perla, MD  diltiazem (CARDIZEM) 30 MG tablet Take 1 tablet (30 mg total) by mouth 2 (two) times daily as needed (sustained elevated heart rates >100 bpm for > 20 minutes.). 03/19/20   Sande Rives E, PA-C  FLUoxetine (PROZAC) 40 MG capsule Take 1 capsule (40 mg total) by mouth daily. For depression 11/08/20   Binnie Rail, MD  glucose blood test strip  Use to check blood sugar daily. E11.9 11/08/20   Binnie Rail, MD  hydrochlorothiazide (HYDRODIURIL) 25 MG tablet Take 1 tablet (25 mg total) by mouth daily. For blood pressure 06/17/20   Binnie Rail, MD  Lancets MISC Use to check blood sugars daily. E11.9 11/08/20   Binnie Rail, MD  levothyroxine (SYNTHROID) 112 MCG tablet Take 1 tablet (112 mcg total) by mouth daily before breakfast. For thyroid 06/17/20   Binnie Rail, MD  LORazepam (ATIVAN) 0.5 MG tablet TAKE ONE TABLET BY MOUTH TWICE A DAY AS NEEDED FOR ANXIETY 12/27/20   Biagio Borg, MD  magnesium oxide (MAG-OX) 400 MG tablet Take 400 mg by mouth daily.    [provider]  Melatonin 5 MG TABS Take 10 mg by mouth at bedtime.    [provider]  metFORMIN (GLUCOPHAGE-XR) 750 MG 24 hr tablet Take 1 tablet (750 mg total) by mouth in the morning and at bedtime. For diabetes 06/17/20   Binnie Rail, MD  mometasone (NASONEX) 50 MCG/ACT nasal spray Place 2 sprays into the nose as needed (allergies). 11/08/20   Binnie Rail, MD  Multiple Vitamin (MULTIVITAMIN) tablet Take 1 tablet by mouth daily.    [provider]  nitroGLYCERIN (NITROSTAT) 0.4 MG SL tablet Place 1 tablet (0.4 mg total) under the tongue every 5 (five) minutes as needed  for chest pain. 12/13/14   Barrett, Evelene Croon, PA-C  omeprazole (PRILOSEC) 20 MG capsule TAKE ONE CAPSULE BY MOUTH EVERYDAY FOR HEARTBURN 12/17/20   Burns, Claudina Lick, MD  potassium chloride SA (KLOR-CON M20) 20 MEQ tablet Take 1 tablet (20 mEq total) by mouth daily. For low potassium 06/17/20   Binnie Rail, MD  pregabalin (LYRICA) 75 MG capsule Take 1 capsule (75 mg total) by mouth 2 (two) times daily. 12/20/20   Burns, Claudina Lick, MD  RESTASIS 0.05 % ophthalmic emulsion Place 1 drop into both eyes 2 (two) times daily.  09/27/19   [provider]  Semaglutide (RYBELSUS) 3 MG TABS Take 3 mg by mouth daily after breakfast. 12/22/20   Binnie Rail, MD  sildenafil (REVATIO) 20 MG tablet Take 40-100 mg by mouth daily as needed. 12/01/19   [provider]  silodosin (RAPAFLO) 8 MG CAPS capsule Take 8 mg by mouth daily. 11/07/20   [provider]  solifenacin (VESICARE) 10 MG tablet Take 10 mg by mouth daily. 04/12/20   [provider]  vitamin C (ASCORBIC ACID) 500 MG tablet Take 500 mg by mouth 2 (two) times daily.     [provider]  VITAMIN D, CHOLECALCIFEROL, PO Take 25 mcg by mouth daily.     [provider]    Family History    Family History  Problem Relation Age of Onset   Coronary artery disease Brother    Heart disease Father    Lung cancer Father    Kidney cancer Father    Prostate cancer Father    Arthritis Mother    Lung cancer Mother    Dementia Mother        Unspecified type, not Alzheimer's disease   He indicated that his mother is deceased. He indicated that his father is deceased. He indicated that his brother is alive. He indicated that his maternal grandmother is deceased. He indicated that his maternal grandfather is deceased. He indicated that his paternal grandmother is deceased. He indicated that his paternal grandfather is deceased.  Social History  Social History   Socioeconomic History   Marital status: Married     Spouse name: Not on file   Number of children: 2   Years of education: 37   Highest education level: Master's degree (e.g., MA, MS, MEng, MEd, MSW, MBA)  Occupational History   Occupation: RETIRED    Employer: RETIRED  Tobacco Use   Smoking status: Former    Packs/day: 2.00    Years: 10.00    Pack years: 20.00    Types: Cigarettes    Quit date: 07/18/1975    Years since quitting: 45.4   Smokeless tobacco: Never  Vaping Use   Vaping Use: Never used  Substance and Sexual Activity   Alcohol use: Yes    Comment: 1-2 drinks/day   Drug use: No   Sexual activity: Not Currently  Other Topics Concern   Not on file  Social History Narrative   Not on file   Social Determinants of Health   Financial Resource Strain: Low Risk    Difficulty of Paying Living Expenses: Not hard at all  Food Insecurity: No Food Insecurity   Worried About Charity fundraiser in the Last Year: Never true   [place] in the Last Year: Never true  Transportation Needs: No Transportation Needs   Lack of Transportation (Medical): No   Lack of Transportation (Non-Medical): No  Physical Activity: Inactive   Days of Exercise per Week: 0 days   Minutes of Exercise per Session: 0 min  Stress: No Stress Concern Present   Feeling of Stress : Not at all  Social Connections: Socially Integrated   Frequency of Communication with Friends and Family: More than three times a week   Frequency of Social Gatherings with Friends and Family: Once a week   Attends Religious Services: 1 to 4 times per year   Active Member of [employer] or Organizations: Yes   Attends Archivist Meetings: 1 to 4 times per year   Marital Status: Married  Human resources officer Violence: Not on file     Review of Systems    General:  No chills, fever, night sweats or weight changes.  Cardiovascular:  No chest pain, dyspnea on exertion, edema, orthopnea, palpitations, paroxysmal nocturnal dyspnea. Dermatological: No rash,  lesions/masses Respiratory: No cough, dyspnea Urologic: No hematuria, dysuria Abdominal:   No nausea, vomiting, diarrhea, bright red blood per rectum, melena, or hematemesis Neurologic:  No visual changes, wkns, changes in mental status. All other systems reviewed and are otherwise negative except as noted above.  Physical Exam    VS:  There were no vitals taken for this visit. , BMI There is no height or weight on file to calculate BMI. GEN: Well nourished, well developed, in no acute distress. HEENT: normal. Neck: Supple, no JVD, carotid bruits, or masses. Cardiac: RRR, no murmurs, rubs, or gallops. No clubbing, cyanosis, edema.  Radials/DP/PT 2+ and equal bilaterally.  Respiratory:  Respirations regular and unlabored, clear to auscultation bilaterally. GI: Soft, nontender, nondistended, BS + x 4. MS: no deformity or atrophy. Skin: warm and dry, no rash. Neuro:  Strength and sensation are intact. Psych: Normal affect.  Accessory Clinical Findings    Recent Labs: 11/08/2020: Hemoglobin 13.3; Platelets 128 12/20/2020: ALT 17; BUN 21; Creatinine, Ser 1.21; Potassium 3.9; Sodium 140; TSH 2.62   Recent Lipid Panel    Component Value Date/Time   CHOL 138 11/08/2020 1624   TRIG 245 (H) 11/08/2020 1624   HDL 42 11/08/2020 1624  CHOLHDL 3.3 11/08/2020 1624   VLDL 42.2 (H) 08/14/2019 1028   LDLCALC 65 11/08/2020 1624   LDLDIRECT 51.0 08/14/2019 1028    ECG personally reviewed by me today- *** - No acute changes  Echocardiogram 02/08/2017  Study Conclusions   - Left ventricle: The cavity size was normal. Systolic function was    normal. The estimated ejection fraction was in the range of 60%    to 65%. Wall motion was normal; there were no regional wall    motion abnormalities. Features are consistent with a pseudonormal    left ventricular filling pattern, with concomitant abnormal    relaxation and increased filling pressure (grade 2 diastolic    dysfunction).  - Left  atrium: The atrium was mildly dilated.  - Atrial septum: There was increased thickness of the septum,    consistent with lipomatous hypertrophy.  - Tricuspid valve: There was trivial regurgitation.  - Pulmonary arteries: Systolic pressure could not be accurately    estimated.    Assessment & Plan   1.  Paroxysmal atrial fibrillation-heart rate today***.  Denies any recent episodes of irregular or accelerated heart rates.  Continues to be somewhat unstable on his feet.  Denies recent falls. Continue apixaban Avoid triggers caffeine, chocolate, EtOH, dehydration etc.  Essential hypertension-BP today***.  Well-controlled at home. Continue diltiazem, HCTZ Heart healthy low-sodium diet-salty 6 given Increase physical activity as tolerated  Obstructive sleep apnea-reports compliance with CPAP Continue CPAP use Continue weight loss  Hyperlipidemia-11/08/2020: Cholesterol 138; HDL 42; LDL Cholesterol (Calc) 65; Triglycerides 245 Continue atorvastatin Heart healthy low-sodium high-fiber diet Increase physical activity as tolerated Start omega-3 fatty acids 2 g twice daily Limit sweets and EtOH  Disposition: Follow-up with Dr. Stanford Breed in 6 months.  Jossie Ng. Yi Haugan NP-C    01/01/2021, 6:30 AM [place] [address] Office [phone] Fax [phone]  Notice: This dictation was prepared with Dragon dictation along with smaller phrase technology. Any transcriptional errors that result from this process are unintentional and may not be corrected upon review.  I spent***minutes examining this patient, reviewing medications, and using patient centered shared decision making involving her cardiac care.  Prior to her visit I spent greater than 20 minutes reviewing her past medical history,  medications, and prior cardiac tests.

## 2021-01-01 NOTE — Progress Notes (Signed)
Called [employer] to check the status of patient assistance for Rybelsus, spoke with Amber patient was approved through 05/14/21. It was been fulfilled and can take up to 1-14 business days to receive.  Orinda Kenner, [place] Clinical Pharmacists Assistant [phone]  Time Spent:  56

## 2021-01-01 NOTE — Progress Notes (Signed)
Called [employer] to check the status of patient assistance for Farxiga, patient was denied because he was over income according to the soft credit pull they used. Patient can submit proof of income for another determination to be made.  Orinda Kenner, [place] Clinical Pharmacists Assistant [phone]  Time Spent: 52

## 2021-01-02 ENCOUNTER — Other Ambulatory Visit: Payer: Self-pay | Admitting: Internal Medicine

## 2021-01-03 ENCOUNTER — Ambulatory Visit (INDEPENDENT_AMBULATORY_CARE_PROVIDER_SITE_OTHER): Payer: Medicare Other | Admitting: Cardiology

## 2021-01-03 ENCOUNTER — Encounter: Payer: Self-pay | Admitting: Cardiology

## 2021-01-03 ENCOUNTER — Ambulatory Visit: Payer: Medicare Other | Admitting: General Practice

## 2021-01-03 ENCOUNTER — Ambulatory Visit (INDEPENDENT_AMBULATORY_CARE_PROVIDER_SITE_OTHER): Payer: Medicare Other | Admitting: Psychology

## 2021-01-03 ENCOUNTER — Other Ambulatory Visit: Payer: Self-pay | Admitting: Internal Medicine

## 2021-01-03 ENCOUNTER — Other Ambulatory Visit: Payer: Self-pay

## 2021-01-03 VITALS — BP 132/52 | HR 67 | Ht 67.0 in | Wt 197.0 lb

## 2021-01-03 DIAGNOSIS — E7800 Pure hypercholesterolemia, unspecified: Secondary | ICD-10-CM

## 2021-01-03 DIAGNOSIS — I10 Essential (primary) hypertension: Secondary | ICD-10-CM

## 2021-01-03 DIAGNOSIS — S065X9A Traumatic subdural hemorrhage with loss of consciousness of unspecified duration, initial encounter: Secondary | ICD-10-CM

## 2021-01-03 DIAGNOSIS — F330 Major depressive disorder, recurrent, mild: Secondary | ICD-10-CM

## 2021-01-03 DIAGNOSIS — R4189 Other symptoms and signs involving cognitive functions and awareness: Secondary | ICD-10-CM | POA: Diagnosis not present

## 2021-01-03 DIAGNOSIS — I480 Paroxysmal atrial fibrillation: Secondary | ICD-10-CM | POA: Diagnosis not present

## 2021-01-03 DIAGNOSIS — M797 Fibromyalgia: Secondary | ICD-10-CM

## 2021-01-03 DIAGNOSIS — F411 Generalized anxiety disorder: Secondary | ICD-10-CM | POA: Diagnosis not present

## 2021-01-03 DIAGNOSIS — S065XAA Traumatic subdural hemorrhage with loss of consciousness status unknown, initial encounter: Secondary | ICD-10-CM

## 2021-01-03 NOTE — Progress Notes (Signed)
HPI:  FU atrial fibrillation. Previously with a fib but converted spontaneously to sinus rhythm. Patient also with significant COPD. Nuclear study July 2016 showed ejection fraction 56% and normal perfusion. Echocardiogram repeated August 2018 and showed normal LV function, grade 2 diastolic dysfunction and mild left atrial enlargement. Had fall September 2019 resulting in subdural hematoma. Again admitted 9/21 after falling and had subdural. Monitor 12/21 showed sinus with pacs, pvcs, paf pat and 4 beats NSVT.  Abdominal ultrasound July 2022 showed no aneurysm.  Since I last saw him, he denies dyspnea, chest pain, palpitations or syncope.  No recurrent falls though some unsteadiness.  No bleeding.  Current Outpatient Medications  Medication Sig Dispense Refill   acetaminophen (TYLENOL) 650 MG CR tablet Take 650 mg by mouth in the morning and at bedtime. Pt takes 2 tabs 2 times daily     allopurinol (ZYLOPRIM) 300 MG tablet Take 1 tablet (300 mg total) by mouth every evening. For gout 90 tablet 3   apixaban (ELIQUIS) 5 MG TABS tablet Take 1 tablet (5 mg total) by mouth 2 (two) times daily. 180 tablet 1   atorvastatin (LIPITOR) 20 MG tablet Take 1 tablet (20 mg total) by mouth daily. For cholesterol 90 tablet 1   bimatoprost (LUMIGAN) 0.01 % SOLN Place 1 drop into both eyes at bedtime.      Biotin 5000 MCG TABS Take 5,000 mcg by mouth daily.     calcium-vitamin D (OSCAL WITH D) 500-200 MG-UNIT tablet Take 1 tablet by mouth daily with breakfast.     colchicine 0.6 MG tablet Take 0.6 mg by mouth daily as needed (flareups).      Continuous Blood Gluc Receiver (FREESTYLE LIBRE 14 DAY READER) DEVI UAD to check sugars.  E11.65 1 each 0   Continuous Blood Gluc Sensor (FREESTYLE LIBRE 14 DAY SENSOR) MISC UAD to check sugars, E11.65 2 each 5   dapagliflozin propanediol (FARXIGA) 10 MG TABS tablet Take 1 tablet (10 mg total) by mouth daily before breakfast. For diabetes 30 tablet 5   diltiazem  (CARDIZEM CD) 300 MG 24 hr capsule TAKE ONE CAPSULE BY MOUTH DAILY 90 capsule 3   diltiazem (CARDIZEM) 30 MG tablet Take 1 tablet (30 mg total) by mouth 2 (two) times daily as needed (sustained elevated heart rates >100 bpm for > 20 minutes.). 30 tablet 2   FLUoxetine (PROZAC) 40 MG capsule Take 1 capsule (40 mg total) by mouth daily. For depression 90 capsule 3   glucose blood test strip Use to check blood sugar daily. E11.9 100 each 3   hydrochlorothiazide (HYDRODIURIL) 25 MG tablet Take 1 tablet (25 mg total) by mouth daily. For blood pressure 90 tablet 3   Lancets MISC Use to check blood sugars daily. E11.9 100 each 3   levothyroxine (SYNTHROID) 112 MCG tablet Take 1 tablet (112 mcg total) by mouth daily before breakfast. For thyroid 90 tablet 1   LORazepam (ATIVAN) 0.5 MG tablet TAKE ONE TABLET BY MOUTH TWICE A DAY AS NEEDED FOR ANXIETY 30 tablet 0   magnesium oxide (MAG-OX) 400 MG tablet Take 400 mg by mouth daily.     Melatonin 5 MG TABS Take 10 mg by mouth at bedtime.     metFORMIN (GLUCOPHAGE-XR) 750 MG 24 hr tablet Take 1 tablet (750 mg total) by mouth in the morning and at bedtime. For diabetes 180 tablet 1   mometasone (NASONEX) 50 MCG/ACT nasal spray Place 2 sprays into the nose as needed (  allergies). 17 g 5   Multiple Vitamin (MULTIVITAMIN) tablet Take 1 tablet by mouth daily.     nitroGLYCERIN (NITROSTAT) 0.4 MG SL tablet Place 1 tablet (0.4 mg total) under the tongue every 5 (five) minutes as needed for chest pain. 90 tablet 3   omeprazole (PRILOSEC) 20 MG capsule TAKE ONE CAPSULE BY MOUTH EVERYDAY FOR HEARTBURN 90 capsule 1   potassium chloride SA (KLOR-CON M20) 20 MEQ tablet Take 1 tablet (20 mEq total) by mouth daily. For low potassium 90 tablet 1   pregabalin (LYRICA) 75 MG capsule Take 1 capsule (75 mg total) by mouth 2 (two) times daily. 60 capsule 0   RESTASIS 0.05 % ophthalmic emulsion Place 1 drop into both eyes 2 (two) times daily.      Semaglutide (RYBELSUS) 3 MG TABS  Take 3 mg by mouth daily after breakfast. 30 tablet 3   sildenafil (REVATIO) 20 MG tablet Take 40-100 mg by mouth daily as needed.     silodosin (RAPAFLO) 8 MG CAPS capsule Take 8 mg by mouth daily.     solifenacin (VESICARE) 10 MG tablet Take 10 mg by mouth daily.     vitamin C (ASCORBIC ACID) 500 MG tablet Take 500 mg by mouth 2 (two) times daily.      VITAMIN D, CHOLECALCIFEROL, PO Take 25 mcg by mouth daily.      No current facility-administered medications for this visit.     Past Medical History:  Diagnosis Date   Acute encephalopathy 05/08/2019   Allergic rhinitis, seasonal 12/04/2011   Arthritis of hand 11/08/2020   BPH (benign prostatic hyperplasia)    Bronchitis    Cataract    Chronic anticoagulation 12/21/2017   CHADS VASC=4, Eliquis stopped Sept 2021- recurrent falls with SDH   Chronic atrial fibrillation    On Eliquis   Chronic pain, legs and back 03/10/2017   Preferred pain management Leg and back pain   Compression fracture of thoracic vertebra 09/21/2017   COPD (chronic obstructive pulmonary disease)    2 liters O2 HS   Degeneration of thoracic intervertebral disc 09/21/2017   Degenerative joint disease of hand 07/14/2017   Dupuytren's disease of palm 07/12/2019   Essential hypertension 12/04/2011   Fatty tumor    waste and back   Fibromyalgia    Generalized anxiety disorder 06/12/2020   12/21 He is reporting severe insomnia and anxiety symptoms.  Options are limited.  We will try a low-dose lorazepam at night and during the day for anxiety and panic attacks.  He will stop lorazepam if problems.  He will stop drinking alcohol.  Discontinue BuSpar.  Reduce Cymbalta to 1 a day.   GERD (gastroesophageal reflux disease)    Glaucoma 03/08/2016   Gout 03/08/2016   Hereditary and idiopathic peripheral neuropathy 08/28/2015   History of COVID-19 05/08/2019   2021 Post-COVID sx's -" brain fog" Try [place]'s mane supplement and B complex with niacin for neuropathy    Hyperlipidemia 03/10/2017   Hypothyroidism    Hypoxia    Insomnia    Intracranial hemorrhage    Leukocytosis 03/14/2020   Major depressive disorder 09/07/2016   Obstructive sleep apnea    Osteoarthritis    Osteoporosis    Recurrent falls 03/25/2020   PT. Treat neuropathy, insomnia.  Reduce Cymbalta to 1 a day.  Discontinue BuSpar.   Rotator cuff arthropathy of left shoulder 07/21/2019   SDH (subdural hematoma) 03/21/2020   Recurrent SDH secondary to falls at home- Sept 2019 and again 03/14/2020-  Eliquis stopped.   Sebaceous cyst 06/27/2019   Spinal compression fracture seventh vertebre   Spondylolisthesis 09/07/2016   On fosamax - for about one year - Dr Alyson Ingles  10/14/17 dexa: normal dexa -- spine 2.1,   RFN -0.9,  LFN   -0.7   - no comparison on file, previous fracture      Thoracic back pain 08/10/2017   Thrombocytopenia 12/20/2020   TIA (transient ischemic attack) 08/28/2015   Type 2 diabetes mellitus 03/08/2016    Past Surgical History:  Procedure Laterality Date   APPENDECTOMY  age 30   [place] Right    early 2000s   CATARACT EXTRACTION     bilateral   CHOLECYSTECTOMY  age 11   EYE SURGERY     FOOT ARTHRODESIS Right 02/02/2013   Procedure: RIGHT HALLUX METATARSAL PHALANGEAL JOINT ARTHRODESIS ;  Surgeon: Wylene Simmer, MD;  Location: [facility];  Service: Orthopedics;  Laterality: Right;   INGUINAL HERNIA REPAIR  age 55   rt side   NASAL CONCHA BULLOSA RESECTION  age 63   PROSTATE SURGERY     SHOULDER ARTHROSCOPY W/ ROTATOR CUFF REPAIR Right    early 2000s   tonsil     VASECTOMY  age 72    Social History   Socioeconomic History   Marital status: Married    Spouse name: Not on file   Number of children: 2   Years of education: 18   Highest education level: Master's degree (e.g., MA, MS, MEng, MEd, MSW, MBA)  Occupational History   Occupation: RETIRED    Employer: RETIRED  Tobacco Use   Smoking status: Former    Packs/day: 2.00     Years: 10.00    Pack years: 20.00    Types: Cigarettes    Quit date: 07/18/1975    Years since quitting: 45.4   Smokeless tobacco: Never  Vaping Use   Vaping Use: Never used  Substance and Sexual Activity   Alcohol use: Yes    Comment: 1-2 drinks/day   Drug use: No   Sexual activity: Not Currently  Other Topics Concern   Not on file  Social History Narrative   Not on file   Social Determinants of Health   Financial Resource Strain: Low Risk    Difficulty of Paying Living Expenses: Not hard at all  Food Insecurity: No Food Insecurity   Worried About Charity fundraiser in the Last Year: Never true   [place] in the Last Year: Never true  Transportation Needs: No Transportation Needs   Lack of Transportation (Medical): No   Lack of Transportation (Non-Medical): No  Physical Activity: Inactive   Days of Exercise per Week: 0 days   Minutes of Exercise per Session: 0 min  Stress: No Stress Concern Present   Feeling of Stress : Not at all  Social Connections: Socially Integrated   Frequency of Communication with Friends and Family: More than three times a week   Frequency of Social Gatherings with Friends and Family: Once a week   Attends Religious Services: 1 to 4 times per year   Active Member of [employer] or Organizations: Yes   Attends Archivist Meetings: 1 to 4 times per year   Marital Status: Married  Human resources officer Violence: Not on file    Family History  Problem Relation Age of Onset   Coronary artery disease Brother    Heart disease Father    Lung cancer Father  Kidney cancer Father    Prostate cancer Father    Arthritis Mother    Lung cancer Mother    Dementia Mother        Unspecified type, not Alzheimer's disease    ROS: Diffuse arthralgias but no fevers or chills, productive cough, hemoptysis, dysphasia, odynophagia, melena, hematochezia, dysuria, hematuria, rash, seizure activity, orthopnea, PND, pedal edema, claudication. Remaining  systems are negative.  Physical Exam: Well-developed well-nourished in no acute distress.  Skin is warm and dry.  HEENT is normal.  Neck is supple.  Chest is clear to auscultation with normal expansion.  Cardiovascular exam is regular rate and rhythm.  Abdominal exam nontender or distended. No masses palpated. Extremities show no edema. neuro grossly intact   A/P  1 paroxysmal atrial fibrillation-patient remains in sinus rhythm on examination today.  We will continue Cardizem for rate control if atrial fibrillation recurs.  Anticoagulation is a difficult issue.  He has had previous TIAs with multiple other embolic risk factors.  However he has also fallen previously.  No further falls since last office visit.  We will continue apixaban for now.  2 hypertension-blood pressure controlled.  Continue present medical regimen.  3 hyperlipidemia-continue statin.  4 obstructive sleep apnea-continue CPAP.  Kirk Ruths, MD

## 2021-01-03 NOTE — Patient Instructions (Signed)

## 2021-01-03 NOTE — Progress Notes (Signed)
   Neuropsychology Feedback Session [name]. [facility] Department of Neurology  Reason for Referral:   Jonathan [name] is a 87 y.o. Caucasian male referred by Billey Gosling, M.D., to characterize his current cognitive functioning and assist with diagnostic clarity and treatment planning in the context of subjective cognitive decline and several medical and psychiatric comorbidities.  Feedback:   Mr. Hacke completed a comprehensive neuropsychological evaluation on 12/27/2020. Please refer to that encounter for the full report and recommendations. Briefly, results suggested significant performance variability across numerous cognitive domains including processing speed, executive functioning, confrontation naming, visuospatial abilities, and learning and memory. No consistent impairments were exhibited. Relative to his previous evaluation in September 2020, there was a large degree of stability when looking by overall domain. While variable, visuospatial abilities did appear to exhibit a mild decline, as did cognitive flexibility. However, outside of this, a consistent decline between the two evaluations cannot be discerned. Relative to most recent neuroimaging, his right-sided intraventricular hemorrhage and bilateral frontal lobe subdural hygromas sustained during a fall this past October could offer an explanation for visuospatial and cognitive flexibility decline, as well as greater subjective dysfunction. Additional explanations for experienced day-to-day cognitive difficulties continued to include ongoing psychiatric distress (i.e., mild levels of anxiety and depression) and sleep dysfunction, longstanding traits of ADHD (despite not warranting a diagnosis), chronic pain, and neuroimaging suggesting chronic small vessel ischemic changes in the cerebral white matter and history of a remote subdural hematoma.  Mr. Zabaleta was accompanied by his wife during the current feedback  session. Content of the current session focused on the results of his neuropsychological evaluation. Mr. Runde and his wife were given the opportunity to ask questions and their questions were answered. They were encouraged to reach out should additional questions arise. A copy of his report was provided at the conclusion of the visit.      50 minutes were spent conducting the current feedback session with Mr. Hanser, billed as one unit [phone].

## 2021-01-04 ENCOUNTER — Encounter: Payer: Self-pay | Admitting: Internal Medicine

## 2021-01-04 DIAGNOSIS — R4189 Other symptoms and signs involving cognitive functions and awareness: Secondary | ICD-10-CM | POA: Insufficient documentation

## 2021-01-04 DIAGNOSIS — G3184 Mild cognitive impairment, so stated: Secondary | ICD-10-CM | POA: Insufficient documentation

## 2021-01-07 NOTE — Progress Notes (Signed)
Subjective: Jonathan [name] presents today referred by Binnie Rail, MD for diabetic foot evaluation.  Patient relates 2017 year history of diabetes.  Patient denies any history of foot wounds.  Patient denies any history of numbness, tingling, burning, pins/needles sensations.  Past Medical History:  Diagnosis Date   Acute encephalopathy 05/08/2019   Allergic rhinitis, seasonal 12/04/2011   Arthritis of hand 11/08/2020   BPH (benign prostatic hyperplasia)    Bronchitis    Cataract    Chronic anticoagulation 12/21/2017   CHADS VASC=4, Eliquis stopped Sept 2021- recurrent falls with SDH   Chronic atrial fibrillation    On Eliquis   Chronic pain, legs and back 03/10/2017   Preferred pain management Leg and back pain   Compression fracture of thoracic vertebra 09/21/2017   COPD (chronic obstructive pulmonary disease)    2 liters O2 HS   Degeneration of thoracic intervertebral disc 09/21/2017   Degenerative joint disease of hand 07/14/2017   Dupuytren's disease of palm 07/12/2019   Essential hypertension 12/04/2011   Fatty tumor    waste and back   Fibromyalgia    Generalized anxiety disorder 06/12/2020   12/21 He is reporting severe insomnia and anxiety symptoms.  Options are limited.  We will try a low-dose lorazepam at night and during the day for anxiety and panic attacks.  He will stop lorazepam if problems.  He will stop drinking alcohol.  Discontinue BuSpar.  Reduce Cymbalta to 1 a day.   GERD (gastroesophageal reflux disease)    Glaucoma 03/08/2016   Gout 03/08/2016   Hereditary and idiopathic peripheral neuropathy 08/28/2015   History of COVID-19 05/08/2019   2021 Post-COVID sx's -" brain fog" Try Lion's mane supplement and B complex with niacin for neuropathy   Hyperlipidemia 03/10/2017   Hypothyroidism    Hypoxia    Insomnia    Intracranial hemorrhage    Leukocytosis 03/14/2020   Major depressive disorder 09/07/2016   Obstructive sleep apnea    Osteoarthritis     Osteoporosis    Recurrent falls 03/25/2020   PT. Treat neuropathy, insomnia.  Reduce Cymbalta to 1 a day.  Discontinue BuSpar.   Rotator cuff arthropathy of left shoulder 07/21/2019   SDH (subdural hematoma) 03/21/2020   Recurrent SDH secondary to falls at home- Sept 2019 and again 03/14/2020- Eliquis stopped.   Sebaceous cyst 06/27/2019   Spinal compression fracture seventh vertebre   Spondylolisthesis 09/07/2016   On fosamax - for about one year - Dr Alyson Ingles  10/14/17 dexa: normal dexa -- spine 2.1,   RFN -0.9,  LFN   -0.7   - no comparison on file, previous fracture      Thoracic back pain 08/10/2017   Thrombocytopenia 12/20/2020   TIA (transient ischemic attack) 08/28/2015   Type 2 diabetes mellitus 03/08/2016    Patient Active Problem List   Diagnosis Date Noted   Mild cognitive impairment 01/04/2021   Fatty liver 12/29/2020   Thrombocytopenia 12/20/2020   Urinary frequency 12/20/2020   Arthritis of hand 11/08/2020   Generalized anxiety disorder 06/12/2020   Recurrent falls 03/25/2020   SDH (subdural hematoma) 03/21/2020   Leukocytosis 03/14/2020   Intracranial hemorrhage    Rotator cuff arthropathy of left shoulder 07/21/2019   Dupuytren's disease of palm 07/12/2019   Sebaceous cyst 06/27/2019   History of COVID-19 05/08/2019   Obstructive sleep apnea    Asymmetrical left sensorineural hearing loss 07/12/2018   Chronic anticoagulation 12/21/2017   Degeneration of thoracic intervertebral disc 09/21/2017   Thoracic back  pain 08/10/2017   Degenerative joint disease of hand 07/14/2017   Osteoarthritis    Hypoxia    Fibromyalgia    Fatty tumor    History of cataract    BPH (benign prostatic hyperplasia)    Chronic atrial fibrillation    Insomnia 03/10/2017   Chronic pain, legs and back 03/10/2017   Hyperlipidemia 03/10/2017   Obesity (BMI 30.0-34.9) 12/08/2016   Major depressive disorder 09/07/2016   Spondylolisthesis 09/07/2016   Hypothyroidism 03/08/2016   GERD  (gastroesophageal reflux disease) 03/08/2016   Gout 03/08/2016   Glaucoma 03/08/2016   Type 2 diabetes mellitus 03/08/2016   Hereditary and idiopathic peripheral neuropathy 08/28/2015   TIA (transient ischemic attack) 08/28/2015   Essential hypertension 12/04/2011   Allergic rhinitis, seasonal 12/04/2011   COPD (chronic obstructive pulmonary disease)  07/18/2011    Past Surgical History:  Procedure Laterality Date   APPENDECTOMY  age 58   CARPAL TUNNEL RELEASE Right    early 2000s   CATARACT EXTRACTION     bilateral   CHOLECYSTECTOMY  age 42   EYE SURGERY     FOOT ARTHRODESIS Right 02/02/2013   Procedure: RIGHT HALLUX METATARSAL PHALANGEAL JOINT ARTHRODESIS ;  Surgeon: Wylene Simmer, MD;  Location: [facility];  Service: Orthopedics;  Laterality: Right;   INGUINAL HERNIA REPAIR  age 4   rt side   NASAL CONCHA BULLOSA RESECTION  age 36   PROSTATE SURGERY     SHOULDER ARTHROSCOPY W/ ROTATOR CUFF REPAIR Right    early 2000s   tonsil     VASECTOMY  age 79    Current Outpatient Medications on File Prior to Visit  Medication Sig Dispense Refill   acetaminophen (TYLENOL) 650 MG CR tablet Take 650 mg by mouth in the morning and at bedtime. Pt takes 2 tabs 2 times daily     allopurinol (ZYLOPRIM) 300 MG tablet Take 1 tablet (300 mg total) by mouth every evening. For gout 90 tablet 3   apixaban (ELIQUIS) 5 MG TABS tablet Take 1 tablet (5 mg total) by mouth 2 (two) times daily. 180 tablet 1   atorvastatin (LIPITOR) 20 MG tablet Take 1 tablet (20 mg total) by mouth daily. For cholesterol 90 tablet 1   bimatoprost (LUMIGAN) 0.01 % SOLN Place 1 drop into both eyes at bedtime.      Biotin 5000 MCG TABS Take 5,000 mcg by mouth daily.     calcium-vitamin D (OSCAL WITH D) 500-200 MG-UNIT tablet Take 1 tablet by mouth daily with breakfast.     colchicine 0.6 MG tablet Take 0.6 mg by mouth daily as needed (flareups).      Continuous Blood Gluc Receiver (FREESTYLE LIBRE 14 DAY  READER) DEVI UAD to check sugars.  E11.65 1 each 0   Continuous Blood Gluc Sensor (FREESTYLE LIBRE 14 DAY SENSOR) MISC UAD to check sugars, E11.65 2 each 5   dapagliflozin propanediol (FARXIGA) 10 MG TABS tablet Take 1 tablet (10 mg total) by mouth daily before breakfast. For diabetes 30 tablet 5   diltiazem (CARDIZEM CD) 300 MG 24 hr capsule TAKE ONE CAPSULE BY MOUTH DAILY 90 capsule 3   diltiazem (CARDIZEM) 30 MG tablet Take 1 tablet (30 mg total) by mouth 2 (two) times daily as needed (sustained elevated heart rates >100 bpm for > 20 minutes.). 30 tablet 2   FLUoxetine (PROZAC) 40 MG capsule Take 1 capsule (40 mg total) by mouth daily. For depression 90 capsule 3   glucose blood test strip  Use to check blood sugar daily. E11.9 100 each 3   hydrochlorothiazide (HYDRODIURIL) 25 MG tablet Take 1 tablet (25 mg total) by mouth daily. For blood pressure 90 tablet 3   Lancets MISC Use to check blood sugars daily. E11.9 100 each 3   levothyroxine (SYNTHROID) 112 MCG tablet Take 1 tablet (112 mcg total) by mouth daily before breakfast. For thyroid 90 tablet 1   LORazepam (ATIVAN) 0.5 MG tablet TAKE ONE TABLET BY MOUTH TWICE A DAY AS NEEDED FOR ANXIETY 30 tablet 0   magnesium oxide (MAG-OX) 400 MG tablet Take 400 mg by mouth daily.     Melatonin 5 MG TABS Take 10 mg by mouth at bedtime.     metFORMIN (GLUCOPHAGE-XR) 750 MG 24 hr tablet Take 1 tablet (750 mg total) by mouth in the morning and at bedtime. For diabetes 180 tablet 1   mometasone (NASONEX) 50 MCG/ACT nasal spray Place 2 sprays into the nose as needed (allergies). 17 g 5   Multiple Vitamin (MULTIVITAMIN) tablet Take 1 tablet by mouth daily.     nitroGLYCERIN (NITROSTAT) 0.4 MG SL tablet Place 1 tablet (0.4 mg total) under the tongue every 5 (five) minutes as needed for chest pain. 90 tablet 3   omeprazole (PRILOSEC) 20 MG capsule TAKE ONE CAPSULE BY MOUTH EVERYDAY FOR HEARTBURN 90 capsule 1   potassium chloride SA (KLOR-CON M20) 20 MEQ tablet  Take 1 tablet (20 mEq total) by mouth daily. For low potassium 90 tablet 1   pregabalin (LYRICA) 75 MG capsule Take 1 capsule (75 mg total) by mouth 2 (two) times daily. 60 capsule 0   RESTASIS 0.05 % ophthalmic emulsion Place 1 drop into both eyes 2 (two) times daily.      Semaglutide (RYBELSUS) 3 MG TABS Take 3 mg by mouth daily after breakfast. 30 tablet 3   sildenafil (REVATIO) 20 MG tablet Take 40-100 mg by mouth daily as needed.     silodosin (RAPAFLO) 8 MG CAPS capsule Take 8 mg by mouth daily.     solifenacin (VESICARE) 10 MG tablet Take 10 mg by mouth daily.     vitamin C (ASCORBIC ACID) 500 MG tablet Take 500 mg by mouth 2 (two) times daily.      VITAMIN D, CHOLECALCIFEROL, PO Take 25 mcg by mouth daily.      No current facility-administered medications on file prior to visit.     Allergies  Allergen Reactions   Other Other (See Comments)    DUST-Other reaction(s): Respiratory distress    Social History   Occupational History   Occupation: RETIRED    Employer: RETIRED  Tobacco Use   Smoking status: Former    Packs/day: 2.00    Years: 10.00    Pack years: 20.00    Types: Cigarettes    Quit date: 07/18/1975    Years since quitting: 45.5   Smokeless tobacco: Never  Vaping Use   Vaping Use: Never used  Substance and Sexual Activity   Alcohol use: Yes    Comment: 1-2 drinks/day   Drug use: No   Sexual activity: Not Currently    Family History  Problem Relation Age of Onset   Coronary artery disease Brother    Heart disease Father    Lung cancer Father    Kidney cancer Father    Prostate cancer Father    Arthritis Mother    Lung cancer Mother    Dementia Mother        Unspecified type,  not Alzheimer's disease    Immunization History  Administered Date(s) Administered   Fluad Quad(high Dose 65+) 03/25/2020   Hepatitis A 07/25/2007, 01/22/2008   Hepatitis B 04/30/1988, 06/02/1988, 10/28/1988   IPV 05/15/1996   Influenza Split 02/19/2012, 03/13/2016    Influenza Whole 02/17/2011, 03/13/2013   Influenza, High Dose Seasonal PF 03/10/2017, 02/15/2018, 02/09/2019   Influenza,inj,Quad PF,6+ Mos 03/15/2015   Influenza,inj,quad, With Preservative 02/14/2019   Meningococcal Polysaccharide 05/15/1996   Moderna Sars-Covid-2 Vaccination 07/15/2019, 08/12/2019, 02/15/2020   Pneumococcal Conjugate-13 12/08/2016   Pneumococcal-Unspecified 02/01/2006   Td 01/14/1995   Tdap 03/06/2018   Zoster Recombinat (Shingrix) 01/15/2017, 01/04/2018   Zoster, Live 02/01/2006    Review of systems: Positive Findings in bold print.  Constitutional:  chills, fatigue, fever, sweats, weight change Communication: Optometrist, sign Ecologist, hand writing, iPad/Android device Head: headaches, head injury Eyes: changes in vision, eye pain, glaucoma, cataracts, macular degeneration, diplopia, glare,  light sensitivity, eyeglasses or contacts, blindness Ears nose mouth throat: hearing impaired, hearing aids,  ringing in ears, deaf, sign language,  vertigo, nosebleeds,  rhinitis,  cold sores, snoring, swollen glands Cardiovascular: HTN, edema, arrhythmia, pacemaker in place, defibrillator in place, chest pain/tightness, chronic anticoagulation, blood clot, heart failure, MI Peripheral Vascular: leg cramps, varicose veins, blood clots, lymphedema, varicosities Respiratory:  asthma, difficulty breathing, denies congestion, SOB, wheezing, cough, emphysema Gastrointestinal: change in appetite or weight, abdominal pain, constipation, diarrhea, nausea, vomiting, vomiting blood, change in bowel habits, abdominal pain, jaundice, rectal bleeding, hemorrhoids, GERD Genitourinary:  nocturia,  pain on urination, polyuria,  blood in urine, Foley catheter, urinary urgency, ESRD on hemodialysis Musculoskeletal: amputation, cramping, stiff joints, painful joints, decreased joint motion, fractures, OA, gout, hemiplegia, paraplegia, uses cane, wheelchair bound, uses walker, uses  rollator Skin: +changes in toenails, color change, dryness, itching, mole changes,  rash, wound(s) Neurological: headaches, numbness in feet, paresthesias in feet, burning in feet, fainting,  seizures, change in speech, migraines, memory problems/poor historian, cerebral palsy, weakness, paralysis, CVA, TIA Endocrine: diabetes, hypothyroidism, hyperthyroidism,  goiter, dry mouth, flushing, heat intolerance, cold intolerance,  excessive thirst, denies polyuria,  nocturia Hematological:  easy bleeding, excessive bleeding, easy bruising, enlarged lymph nodes, on long term blood thinner, history of past transusions Allergy/immunological:  hives, eczema, frequent infections, multiple drug allergies, seasonal allergies, transplant recipient, multiple food allergies Psychiatric:  anxiety, depression, mood disorder, suicidal ideations, hallucinations, insomnia  Objective: There were no vitals filed for this visit. Vascular Examination: Capillary refill time within normal limits x 10 digits.  Dorsalis pedis pulses palpable 2 out of 4.  Posterior tibial pulses palpable 2 out of 4.  Digital hair not present x 10 digits.  Skin temperature gradient WNL b/l.  Dermatological Examination: Skin with normal turgor, texture and tone b/l  Toenails 1-5 b/l discolored, thick, dystrophic with subungual debris and pain with palpation to nailbeds due to thickness of nails.  Musculoskeletal: Muscle strength 5/5 to all LE muscle groups.  Hammertoe contractures noted to digits 2 through 5 bilaterally.  Mild pain on palpation  Neurological: Sensation intact with 10 gram monofilament.  Vibratory sensation intact.  Assessment: NIDDM Encounter for diabetic foot examination  Plan: Discussed diabetic foot care principles. Literature dispensed on today. Patient to continue soft, supportive shoe gear daily. Patient to report any pedal injuries to medical professional immediately. Follow up 3  months Patient/POA to call should there be a concern in the interim. Given that patient has hammertoe contracture in the setting of diabetes I believe patient will benefit from diabetic shoes and  insoles.  I discussed this with patient and he would like to obtain them.  He will be casted for diabetic shoes

## 2021-01-13 DIAGNOSIS — R3914 Feeling of incomplete bladder emptying: Secondary | ICD-10-CM | POA: Diagnosis not present

## 2021-01-13 DIAGNOSIS — R351 Nocturia: Secondary | ICD-10-CM | POA: Diagnosis not present

## 2021-01-13 DIAGNOSIS — N401 Enlarged prostate with lower urinary tract symptoms: Secondary | ICD-10-CM | POA: Diagnosis not present

## 2021-01-15 DIAGNOSIS — D225 Melanocytic nevi of trunk: Secondary | ICD-10-CM | POA: Diagnosis not present

## 2021-01-15 DIAGNOSIS — Z1283 Encounter for screening for malignant neoplasm of skin: Secondary | ICD-10-CM | POA: Diagnosis not present

## 2021-01-16 ENCOUNTER — Other Ambulatory Visit: Payer: Self-pay | Admitting: Internal Medicine

## 2021-01-16 NOTE — Telephone Encounter (Signed)
Pt notified that [employer] is in office.  Pt states he will come to pick up today.

## 2021-01-21 ENCOUNTER — Encounter: Payer: Self-pay | Admitting: Internal Medicine

## 2021-01-23 ENCOUNTER — Telehealth: Payer: Self-pay | Admitting: Internal Medicine

## 2021-01-23 NOTE — Telephone Encounter (Signed)
Called today and spoke with Mike Craze.  They will remove patient from list.

## 2021-01-23 NOTE — Telephone Encounter (Signed)
John called in from [employer] asking did we receive fax on patient behalf  Per previous messages from patient.. he is not aware of ordering or requesting anything from [facility]  Phone # in case [phone]

## 2021-02-03 NOTE — Progress Notes (Signed)
Subjective:    Patient ID: Jonathan [name], male    DOB: 1933/03/31, 87 y.o.   MRN: [id number]  HPI The patient is here for follow up of their chronic medical problems, including insomnia, fibromyalgia and peripheral neuropathy, overactive bladder, DM  We started lyrica.  His pain is better.  He saw Dr Melvyn Novas again.  He does have some mild cognitive impairment-it has gotten worse since his last visit.   He has fallen a couple of times last week.     To have urodynamic testing.  Getting ready for radiofrequency ablation for his back.    Sleeping 10-12 hrs a night.  He does not feel refreshed and is always tired.  He is using his cpap.    Medications and allergies reviewed with patient and updated if appropriate.  Patient Active Problem List   Diagnosis Date Noted   Mild cognitive impairment 01/04/2021   Fatty liver 12/29/2020   Thrombocytopenia 12/20/2020   Urinary frequency 12/20/2020   Arthritis of hand 11/08/2020   Generalized anxiety disorder 06/12/2020   Recurrent falls 03/25/2020   SDH (subdural hematoma) 03/21/2020   Leukocytosis 03/14/2020   Intracranial hemorrhage    Rotator cuff arthropathy of left shoulder 07/21/2019   Dupuytren's disease of palm 07/12/2019   Sebaceous cyst 06/27/2019   History of COVID-19 05/08/2019   Obstructive sleep apnea    Asymmetrical left sensorineural hearing loss 07/12/2018   Chronic anticoagulation 12/21/2017   Degeneration of thoracic intervertebral disc 09/21/2017   Thoracic back pain 08/10/2017   Degenerative joint disease of hand 07/14/2017   Osteoarthritis    Hypoxia    Fibromyalgia    Fatty tumor    History of cataract    BPH (benign prostatic hyperplasia)    Chronic atrial fibrillation    Insomnia 03/10/2017   Chronic pain, legs and back 03/10/2017   Hyperlipidemia 03/10/2017   Obesity (BMI 30.0-34.9) 12/08/2016   Major depressive disorder 09/07/2016   Spondylolisthesis 09/07/2016   Hypothyroidism 03/08/2016   GERD  (gastroesophageal reflux disease) 03/08/2016   Gout 03/08/2016   Glaucoma 03/08/2016   Type 2 diabetes mellitus 03/08/2016   Hereditary and idiopathic peripheral neuropathy 08/28/2015   TIA (transient ischemic attack) 08/28/2015   Essential hypertension 12/04/2011   Allergic rhinitis, seasonal 12/04/2011   COPD (chronic obstructive pulmonary disease)  07/18/2011    Current Outpatient Medications on File Prior to Visit  Medication Sig Dispense Refill   acetaminophen (TYLENOL) 650 MG CR tablet Take 650 mg by mouth in the morning and at bedtime. Pt takes 2 tabs 2 times daily     allopurinol (ZYLOPRIM) 300 MG tablet Take 1 tablet (300 mg total) by mouth every evening. For gout 90 tablet 3   apixaban (ELIQUIS) 5 MG TABS tablet Take 1 tablet (5 mg total) by mouth 2 (two) times daily. 180 tablet 1   atorvastatin (LIPITOR) 20 MG tablet Take 1 tablet (20 mg total) by mouth daily. For cholesterol 90 tablet 1   bimatoprost (LUMIGAN) 0.01 % SOLN Place 1 drop into both eyes at bedtime.      Biotin 5000 MCG TABS Take 5,000 mcg by mouth daily.     calcium-vitamin D (OSCAL WITH D) 500-200 MG-UNIT tablet Take 1 tablet by mouth daily with breakfast.     colchicine 0.6 MG tablet Take 0.6 mg by mouth daily as needed (flareups).      Continuous Blood Gluc Receiver (FREESTYLE LIBRE 14 DAY READER) DEVI UAD to check sugars.  E11.65 1 each  0   Continuous Blood Gluc Sensor (FREESTYLE LIBRE 14 DAY SENSOR) MISC UAD to check sugars, E11.65 2 each 5   dapagliflozin propanediol (FARXIGA) 10 MG TABS tablet Take 1 tablet (10 mg total) by mouth daily before breakfast. For diabetes 30 tablet 5   diltiazem (CARDIZEM CD) 300 MG 24 hr capsule TAKE ONE CAPSULE BY MOUTH DAILY 90 capsule 3   diltiazem (CARDIZEM) 30 MG tablet Take 1 tablet (30 mg total) by mouth 2 (two) times daily as needed (sustained elevated heart rates >100 bpm for > 20 minutes.). 30 tablet 2   FLUoxetine (PROZAC) 40 MG capsule Take 1 capsule (40 mg total) by  mouth daily. For depression 90 capsule 3   glucose blood test strip Use to check blood sugar daily. E11.9 100 each 3   hydrochlorothiazide (HYDRODIURIL) 25 MG tablet Take 1 tablet (25 mg total) by mouth daily. For blood pressure 90 tablet 3   Lancets MISC Use to check blood sugars daily. E11.9 100 each 3   levothyroxine (SYNTHROID) 112 MCG tablet Take 1 tablet (112 mcg total) by mouth daily before breakfast. For thyroid 90 tablet 1   LORazepam (ATIVAN) 0.5 MG tablet TAKE ONE TABLET BY MOUTH TWICE A DAY AS NEEDED FOR ANXIETY 30 tablet 0   magnesium oxide (MAG-OX) 400 MG tablet Take 400 mg by mouth daily.     Melatonin 5 MG TABS Take 10 mg by mouth at bedtime.     metFORMIN (GLUCOPHAGE-XR) 750 MG 24 hr tablet Take 1 tablet (750 mg total) by mouth in the morning and at bedtime. For diabetes 180 tablet 1   mometasone (NASONEX) 50 MCG/ACT nasal spray Place 2 sprays into the nose as needed (allergies). 17 g 5   Multiple Vitamin (MULTIVITAMIN) tablet Take 1 tablet by mouth daily.     nitroGLYCERIN (NITROSTAT) 0.4 MG SL tablet Place 1 tablet (0.4 mg total) under the tongue every 5 (five) minutes as needed for chest pain. 90 tablet 3   omeprazole (PRILOSEC) 20 MG capsule TAKE ONE CAPSULE BY MOUTH EVERYDAY FOR HEARTBURN 90 capsule 1   potassium chloride SA (KLOR-CON M20) 20 MEQ tablet Take 1 tablet (20 mEq total) by mouth daily. For low potassium 90 tablet 1   pregabalin (LYRICA) 75 MG capsule TAKE ONE CAPSULE BY MOUTH TWICE A DAY 60 capsule 0   RESTASIS 0.05 % ophthalmic emulsion Place 1 drop into both eyes 2 (two) times daily.      Semaglutide (RYBELSUS) 3 MG TABS Take 3 mg by mouth daily after breakfast. 30 tablet 3   sildenafil (REVATIO) 20 MG tablet Take 40-100 mg by mouth daily as needed.     silodosin (RAPAFLO) 8 MG CAPS capsule Take 8 mg by mouth daily.     solifenacin (VESICARE) 10 MG tablet Take 10 mg by mouth daily.     vitamin C (ASCORBIC ACID) 500 MG tablet Take 500 mg by mouth 2 (two) times  daily.      VITAMIN D, CHOLECALCIFEROL, PO Take 25 mcg by mouth daily.      No current facility-administered medications on file prior to visit.    Past Medical History:  Diagnosis Date   Acute encephalopathy 05/08/2019   Allergic rhinitis, seasonal 12/04/2011   Arthritis of hand 11/08/2020   BPH (benign prostatic hyperplasia)    Bronchitis    Cataract    Chronic anticoagulation 12/21/2017   CHADS VASC=4, Eliquis stopped Sept 2021- recurrent falls with SDH   Chronic atrial fibrillation  On Eliquis   Chronic pain, legs and back 03/10/2017   Preferred pain management Leg and back pain   Compression fracture of thoracic vertebra 09/21/2017   COPD (chronic obstructive pulmonary disease)    2 liters O2 HS   Degeneration of thoracic intervertebral disc 09/21/2017   Degenerative joint disease of hand 07/14/2017   Dupuytren's disease of palm 07/12/2019   Essential hypertension 12/04/2011   Fatty tumor    waste and back   Fibromyalgia    Generalized anxiety disorder 06/12/2020   12/21 He is reporting severe insomnia and anxiety symptoms.  Options are limited.  We will try a low-dose lorazepam at night and during the day for anxiety and panic attacks.  He will stop lorazepam if problems.  He will stop drinking alcohol.  Discontinue BuSpar.  Reduce Cymbalta to 1 a day.   GERD (gastroesophageal reflux disease)    Glaucoma 03/08/2016   Gout 03/08/2016   Hereditary and idiopathic peripheral neuropathy 08/28/2015   History of COVID-19 05/08/2019   2021 Post-COVID sx's -" brain fog" Try [place] mane supplement and B complex with niacin for neuropathy   Hyperlipidemia 03/10/2017   Hypothyroidism    Hypoxia    Insomnia    Intracranial hemorrhage    Leukocytosis 03/14/2020   Major depressive disorder 09/07/2016   Obstructive sleep apnea    Osteoarthritis    Osteoporosis    Recurrent falls 03/25/2020   PT. Treat neuropathy, insomnia.  Reduce Cymbalta to 1 a day.  Discontinue BuSpar.    Rotator cuff arthropathy of left shoulder 07/21/2019   SDH (subdural hematoma) 03/21/2020   Recurrent SDH secondary to falls at home- Sept 2019 and again 03/14/2020- Eliquis stopped.   Sebaceous cyst 06/27/2019   Spinal compression fracture seventh vertebre   Spondylolisthesis 09/07/2016   On fosamax - for about one year - Dr Alyson Ingles  10/14/17 dexa: normal dexa -- spine 2.1,   RFN -0.9,  LFN   -0.7   - no comparison on file, previous fracture      Thoracic back pain 08/10/2017   Thrombocytopenia 12/20/2020   TIA (transient ischemic attack) 08/28/2015   Type 2 diabetes mellitus 03/08/2016    Past Surgical History:  Procedure Laterality Date   APPENDECTOMY  age 45   [place] Right    early 2000s   CATARACT EXTRACTION     bilateral   CHOLECYSTECTOMY  age 45   EYE SURGERY     FOOT ARTHRODESIS Right 02/02/2013   Procedure: RIGHT HALLUX METATARSAL PHALANGEAL JOINT ARTHRODESIS ;  Surgeon: Wylene Simmer, MD;  Location: [facility];  Service: Orthopedics;  Laterality: Right;   INGUINAL HERNIA REPAIR  age 63   rt side   NASAL CONCHA BULLOSA RESECTION  age 54   PROSTATE SURGERY     SHOULDER ARTHROSCOPY W/ ROTATOR CUFF REPAIR Right    early 2000s   tonsil     VASECTOMY  age 66    Social History   Socioeconomic History   Marital status: Married    Spouse name: Not on file   Number of children: 2   Years of education: 18   Highest education level: Master's degree (e.g., MA, MS, MEng, MEd, MSW, MBA)  Occupational History   Occupation: RETIRED    Employer: RETIRED  Tobacco Use   Smoking status: Former    Packs/day: 2.00    Years: 10.00    Pack years: 20.00    Types: Cigarettes    Quit date: 07/18/1975  Years since quitting: 45.5   Smokeless tobacco: Never  Vaping Use   Vaping Use: Never used  Substance and Sexual Activity   Alcohol use: Yes    Comment: 1-2 drinks/day   Drug use: No   Sexual activity: Not Currently  Other Topics Concern   Not on file   Social History Narrative   Not on file   Social Determinants of Health   Financial Resource Strain: Low Risk    Difficulty of Paying Living Expenses: Not hard at all  Food Insecurity: No Food Insecurity   Worried About Charity fundraiser in the Last Year: Never true   [place] in the Last Year: Never true  Transportation Needs: No Transportation Needs   Lack of Transportation (Medical): No   Lack of Transportation (Non-Medical): No  Physical Activity: Inactive   Days of Exercise per Week: 0 days   Minutes of Exercise per Session: 0 min  Stress: No Stress Concern Present   Feeling of Stress : Not at all  Social Connections: Socially Integrated   Frequency of Communication with Friends and Family: More than three times a week   Frequency of Social Gatherings with Friends and Family: Once a week   Attends Religious Services: 1 to 4 times per year   Active Member of [employer] or Organizations: Yes   Attends Archivist Meetings: 1 to 4 times per year   Marital Status: Married    Family History  Problem Relation Age of Onset   Coronary artery disease Brother    Heart disease Father    Lung cancer Father    Kidney cancer Father    Prostate cancer Father    Arthritis Mother    Lung cancer Mother    Dementia Mother        Unspecified type, not Alzheimer's disease    Review of Systems  Constitutional:  Negative for fever.  Respiratory:  Positive for cough (occ). Negative for shortness of breath and wheezing.   Cardiovascular:  Positive for leg swelling (minimal). Negative for chest pain and palpitations.  Neurological:  Positive for headaches (occ sharp headache pain x 1 min or 2).  Psychiatric/Behavioral:  Positive for dysphoric mood (controlled). The patient is not nervous/anxious.       Objective:   Vitals:   02/04/21 1047  BP: 126/72  Pulse: 61  Temp: 97.9 F (36.6 C)  SpO2: 98%   BP Readings from Last 3 Encounters:  02/04/21 126/72  01/03/21 (!)  132/52  12/20/20 130/74   Wt Readings from Last 3 Encounters:  02/04/21 200 lb (90.7 kg)  01/03/21 197 lb (89.4 kg)  12/20/20 194 lb (88 kg)   Body mass index is 31.32 kg/m.   Physical Exam    Constitutional: Appears well-developed and well-nourished. No distress.  HENT:  Head: Normocephalic and atraumatic.  Neck: Neck supple. No tracheal deviation present. No thyromegaly present.  No cervical lymphadenopathy Cardiovascular: Normal rate, regular rhythm and normal heart sounds.   No murmur heard. No carotid bruit .  No edema Pulmonary/Chest: Effort normal and breath sounds normal. No respiratory distress. No has no wheezes. No rales.  Skin: Skin is warm and dry. Not diaphoretic.  Psychiatric: Normal mood and affect. Behavior is normal.      Assessment & Plan:    See Problem List for Assessment and Plan of chronic medical problems.    This visit occurred during the SARS-CoV-2 public health emergency.  Safety protocols were in place,  including screening questions prior to the visit, additional usage of staff PPE, and extensive cleaning of exam room while observing appropriate contact time as indicated for disinfecting solutions.

## 2021-02-04 ENCOUNTER — Encounter: Payer: Self-pay | Admitting: Internal Medicine

## 2021-02-04 ENCOUNTER — Other Ambulatory Visit: Payer: Self-pay

## 2021-02-04 ENCOUNTER — Ambulatory Visit (INDEPENDENT_AMBULATORY_CARE_PROVIDER_SITE_OTHER): Payer: Medicare Other | Admitting: Internal Medicine

## 2021-02-04 VITALS — BP 126/72 | HR 61 | Temp 97.9°F | Ht 67.0 in | Wt 200.0 lb

## 2021-02-04 DIAGNOSIS — I700 Atherosclerosis of aorta: Secondary | ICD-10-CM | POA: Diagnosis not present

## 2021-02-04 DIAGNOSIS — M797 Fibromyalgia: Secondary | ICD-10-CM

## 2021-02-04 DIAGNOSIS — I10 Essential (primary) hypertension: Secondary | ICD-10-CM

## 2021-02-04 DIAGNOSIS — G4700 Insomnia, unspecified: Secondary | ICD-10-CM

## 2021-02-04 DIAGNOSIS — E1165 Type 2 diabetes mellitus with hyperglycemia: Secondary | ICD-10-CM | POA: Diagnosis not present

## 2021-02-04 DIAGNOSIS — G609 Hereditary and idiopathic neuropathy, unspecified: Secondary | ICD-10-CM

## 2021-02-04 DIAGNOSIS — R350 Frequency of micturition: Secondary | ICD-10-CM | POA: Diagnosis not present

## 2021-02-04 HISTORY — DX: Atherosclerosis of aorta: I70.0

## 2021-02-04 MED ORDER — HYDROCHLOROTHIAZIDE 12.5 MG PO TABS: 12.5000 mg | ORAL_TABLET | Freq: Every day | ORAL | 3 refills | Status: DC

## 2021-02-04 NOTE — Assessment & Plan Note (Signed)
Subacute Starting to have very frequent urination Scheduled to have urodynamics soon We will try decreasing hydrochlorothiazide 12.5 mg daily and holding potassium CMP in 2 weeks, follow-up with me in 2 months Stressed low-sodium diet, which he is not compliant with

## 2021-02-04 NOTE — Patient Instructions (Addendum)
  Blood work was ordered.   Have that done in two weeks.     Medications changes include :   decrease the hydrochlorothiazide to 12.5 mg daily.  Stop the potassium.   Stop the ativan.     Your prescription(s) have been submitted to your pharmacy. Please take as directed and contact our office if you believe you are having problem(s) with the medication(s).     Please followup in 2 months

## 2021-02-04 NOTE — Assessment & Plan Note (Signed)
Chronic Continue atorvastatin 20 mg daily 

## 2021-02-04 NOTE — Assessment & Plan Note (Signed)
Chronic Pain is improved with the addition of Lyrica Continue Lyrica 75 mg twice daily-this is likely helping his sleep as well

## 2021-02-04 NOTE — Assessment & Plan Note (Signed)
Chronic Check A1c ?  Need to increase Rybelsus or not Continue Rybelsus 3 mg daily, Farxiga 10 mg daily, metformin 750 mg twice daily

## 2021-02-04 NOTE — Assessment & Plan Note (Signed)
Chronic Chronic pain improved with addition of Lyrica 75 mg twice daily-continue

## 2021-02-04 NOTE — Assessment & Plan Note (Signed)
Chronic Blood pressure well controlled Continue diltiazem 300 mg daily, will decrease hydrochlorothiazide to 12.5 mg daily since his blood pressure is still well controlled and he is having excessive increase in urination Hold potassium CMP in 2 weeks

## 2021-02-04 NOTE — Assessment & Plan Note (Signed)
Chronic Sleeping 10-12 hours at night and still tired throughout the day He is using his CPAP and will have that checked The only change was the Lyrica we will continue that 75 mg twice daily which is helping his fibromyalgia and neuropathy pain Stop Ativan Reevaluate in a couple of months

## 2021-02-05 DIAGNOSIS — M545 Low back pain, unspecified: Secondary | ICD-10-CM | POA: Diagnosis not present

## 2021-02-05 DIAGNOSIS — G894 Chronic pain syndrome: Secondary | ICD-10-CM | POA: Diagnosis not present

## 2021-02-05 DIAGNOSIS — M47817 Spondylosis without myelopathy or radiculopathy, lumbosacral region: Secondary | ICD-10-CM | POA: Diagnosis not present

## 2021-02-05 DIAGNOSIS — M47814 Spondylosis without myelopathy or radiculopathy, thoracic region: Secondary | ICD-10-CM | POA: Diagnosis not present

## 2021-02-06 ENCOUNTER — Encounter: Payer: Self-pay | Admitting: Internal Medicine

## 2021-02-06 DIAGNOSIS — E1142 Type 2 diabetes mellitus with diabetic polyneuropathy: Secondary | ICD-10-CM

## 2021-02-06 DIAGNOSIS — G609 Hereditary and idiopathic neuropathy, unspecified: Secondary | ICD-10-CM

## 2021-02-10 ENCOUNTER — Ambulatory Visit: Payer: Medicare Other | Admitting: Internal Medicine

## 2021-02-12 DIAGNOSIS — R350 Frequency of micturition: Secondary | ICD-10-CM | POA: Diagnosis not present

## 2021-02-14 ENCOUNTER — Other Ambulatory Visit: Payer: Medicare Other

## 2021-02-15 ENCOUNTER — Other Ambulatory Visit: Payer: Self-pay | Admitting: Internal Medicine

## 2021-02-18 ENCOUNTER — Other Ambulatory Visit: Payer: Self-pay

## 2021-02-18 ENCOUNTER — Other Ambulatory Visit (INDEPENDENT_AMBULATORY_CARE_PROVIDER_SITE_OTHER): Payer: Medicare Other

## 2021-02-18 DIAGNOSIS — E1165 Type 2 diabetes mellitus with hyperglycemia: Secondary | ICD-10-CM

## 2021-02-18 LAB — COMPREHENSIVE METABOLIC PANEL
ALT: 18 U/L (ref 0–53)
AST: 18 U/L (ref 0–37)
Albumin: 3.9 g/dL (ref 3.5–5.2)
Alkaline Phosphatase: 90 U/L (ref 39–117)
BUN: 16 mg/dL (ref 6–23)
CO2: 30 mEq/L (ref 19–32)
Calcium: 8.9 mg/dL (ref 8.4–10.5)
Chloride: 102 mEq/L (ref 96–112)
Creatinine, Ser: 0.92 mg/dL (ref 0.40–1.50)
GFR: 75.75 mL/min (ref >60.00)
Glucose, Bld: 163 mg/dL — ABNORMAL HIGH (ref 70–99)
Potassium: 3.9 mEq/L (ref 3.5–5.1)
Sodium: 139 mEq/L (ref 135–145)
Total Bilirubin: 0.3 mg/dL (ref 0.2–1.2)
Total Protein: 6 g/dL (ref 6.0–8.3)

## 2021-02-18 LAB — HEMOGLOBIN A1C: Hgb A1c MFr Bld: 6.6 % — ABNORMAL HIGH (ref 4.6–6.5)

## 2021-02-19 ENCOUNTER — Telehealth: Payer: Self-pay | Admitting: Pharmacist

## 2021-02-19 NOTE — Progress Notes (Signed)
Chronic Care Management Pharmacy Assistant   Name: Jonathan [name]  MRN: [id number] DOB: 18-Jul-1932   Reason for Encounter: Disease State   Conditions to be addressed/monitored: General   Recent office visits:  02/04/21 Binnie Rail, MD (PCP)Type 2 diabetes mellitus with hyperglycemia, without long-term current use of insulin  Med changes: decrease hydrochlorothiazide to 12.5 mg and stop potassium and ativan  Recent consult visits:  01/03/21 Hazle Coca, PhD-Psychology  (cognitive functioning) No med changes 01/01/21 Felipa Furnace, DPM-Podiatry ([place]) No med changes  Hospital visits:  None in previous 6 months  Medications: Outpatient Encounter Medications as of 02/19/2021  Medication Sig   acetaminophen (TYLENOL) 650 MG CR tablet Take 650 mg by mouth in the morning and at bedtime. Pt takes 2 tabs 2 times daily   allopurinol (ZYLOPRIM) 300 MG tablet Take 1 tablet (300 mg total) by mouth every evening. For gout   apixaban (ELIQUIS) 5 MG TABS tablet Take 1 tablet (5 mg total) by mouth 2 (two) times daily.   atorvastatin (LIPITOR) 20 MG tablet Take 1 tablet (20 mg total) by mouth daily. For cholesterol   bimatoprost (LUMIGAN) 0.01 % SOLN Place 1 drop into both eyes at bedtime.    Biotin 5000 MCG TABS Take 5,000 mcg by mouth daily.   calcium-vitamin D (OSCAL WITH D) 500-200 MG-UNIT tablet Take 1 tablet by mouth daily with breakfast.   colchicine 0.6 MG tablet Take 0.6 mg by mouth daily as needed (flareups).    Continuous Blood Gluc Receiver (FREESTYLE LIBRE 14 DAY READER) DEVI UAD to check sugars.  E11.65   Continuous Blood Gluc Sensor (FREESTYLE LIBRE 14 DAY SENSOR) MISC UAD to check sugars, E11.65   dapagliflozin propanediol (FARXIGA) 10 MG TABS tablet Take 1 tablet (10 mg total) by mouth daily before breakfast. For diabetes   diltiazem (CARDIZEM CD) 300 MG 24 hr capsule TAKE ONE CAPSULE BY MOUTH DAILY   diltiazem (CARDIZEM) 30 MG tablet Take 1 tablet (30 mg total) by  mouth 2 (two) times daily as needed (sustained elevated heart rates >100 bpm for > 20 minutes.).   FLUoxetine (PROZAC) 40 MG capsule Take 1 capsule (40 mg total) by mouth daily. For depression   glucose blood test strip Use to check blood sugar daily. E11.9   hydrochlorothiazide (HYDRODIURIL) 12.5 MG tablet Take 1 tablet (12.5 mg total) by mouth daily.   Lancets MISC Use to check blood sugars daily. E11.9   levothyroxine (SYNTHROID) 112 MCG tablet Take 1 tablet (112 mcg total) by mouth daily before breakfast. For thyroid   magnesium oxide (MAG-OX) 400 MG tablet Take 400 mg by mouth daily.   Melatonin 5 MG TABS Take 10 mg by mouth at bedtime.   metFORMIN (GLUCOPHAGE-XR) 750 MG 24 hr tablet Take 1 tablet (750 mg total) by mouth in the morning and at bedtime. For diabetes   mometasone (NASONEX) 50 MCG/ACT nasal spray Place 2 sprays into the nose as needed (allergies).   Multiple Vitamin (MULTIVITAMIN) tablet Take 1 tablet by mouth daily.   nitroGLYCERIN (NITROSTAT) 0.4 MG SL tablet Place 1 tablet (0.4 mg total) under the tongue every 5 (five) minutes as needed for chest pain.   omeprazole (PRILOSEC) 20 MG capsule TAKE ONE CAPSULE BY MOUTH EVERYDAY FOR HEARTBURN   pregabalin (LYRICA) 75 MG capsule TAKE ONE CAPSULE BY MOUTH TWICE A DAY   RESTASIS 0.05 % ophthalmic emulsion Place 1 drop into both eyes 2 (two) times daily.    Semaglutide (RYBELSUS) 3 MG  TABS Take 3 mg by mouth daily after breakfast.   sildenafil (REVATIO) 20 MG tablet Take 40-100 mg by mouth daily as needed.   silodosin (RAPAFLO) 8 MG CAPS capsule Take 8 mg by mouth daily.   solifenacin (VESICARE) 10 MG tablet Take 10 mg by mouth daily.   vitamin C (ASCORBIC ACID) 500 MG tablet Take 500 mg by mouth 2 (two) times daily.    VITAMIN D, CHOLECALCIFEROL, PO Take 25 mcg by mouth daily.    No facility-administered encounter medications on file as of 02/19/2021.    Contacted Jonathan [name] on 02/19/21 for general disease state and medication  adherence call.   Patient is not > 5 days past due for refill on the following medications per chart history:  [facility] Medications: Medication Name/mg Last Fill Days Supply Atorvastatin 20 mg  12/17/20  90 Metformin 750 mg  01/02/21 90 Farxiga 10 mg   12/20/20  90  What concerns do you have about your medications? Patient states that he is having issues with the freestyle libre sensor not working porperly  The patient denies side effects with his medications.   How often do you forget or accidentally miss a dose? Never  Do you use a pillbox? Yes, has one built into dresser drawer  Are you having any problems getting your medications from your pharmacy? No  Has the cost of your medications been a concern? Yes If yes, what medication and is patient assistance available or has it been applied for? Patient states his lyrica 75 mg is to expensive $100 a month  Since last visit with CPP, no interventions have been made:   The patient has not had an ED visit since last contact.   The patient denies problems with their health.   he denies  concerns or questions for [employer], Pharm. D at this time.     Care Gaps: Last annual wellness visit:none noted If Diabetic: Last eye exam / retinopathy screening:02/01/19 Last diabetic foot exam:02/06/21  PCP appointment on 04/07/21    [facility] [phone]   Time spent:49

## 2021-02-23 ENCOUNTER — Other Ambulatory Visit: Payer: Self-pay | Admitting: Cardiology

## 2021-02-24 NOTE — Telephone Encounter (Signed)
Prescription refill request for Eliquis received. Indication:atrial fib Last office visit:7/22 Scr:0.9 Age: 87 Weight:90.7 kg  Prescription refilled

## 2021-02-25 DIAGNOSIS — Z23 Encounter for immunization: Secondary | ICD-10-CM | POA: Diagnosis not present

## 2021-02-28 ENCOUNTER — Telehealth: Payer: Self-pay | Admitting: Internal Medicine

## 2021-02-28 NOTE — Telephone Encounter (Signed)
.  [name], Jonathan [name] are scheduled for a virtual visit with your provider today.    Just as we do with appointments in the office, we must obtain your consent to participate.  Your consent will be active for this visit and any virtual visit you may have with one of our providers in the next 365 days.    If you have a MyChart account, I can also send a copy of this consent to you electronically.  All virtual visits are billed to your insurance company just like a traditional visit in the office.  As this is a virtual visit, video technology does not allow for your provider to perform a traditional examination.  This may limit your provider's ability to fully assess your condition.  If your provider identifies any concerns that need to be evaluated in person or the need to arrange testing such as labs, EKG, etc, we will make arrangements to do so.    Although advances in technology are sophisticated, we cannot ensure that it will always work on either your end or our end.  If the connection with a video visit is poor, we may have to switch to a telephone visit.  With either a video or telephone visit, we are not always able to ensure that we have a secure connection.   I need to obtain your verbal consent now.   Are you willing to proceed with your visit today?   Jonathan [name] has provided verbal consent on 02/28/2021 for a virtual visit (video or telephone).   Jonathan [name] 02/28/2021  8:53 AM

## 2021-03-03 ENCOUNTER — Ambulatory Visit (INDEPENDENT_AMBULATORY_CARE_PROVIDER_SITE_OTHER): Payer: Medicare Other | Admitting: Nurse Practitioner

## 2021-03-03 ENCOUNTER — Encounter: Payer: Self-pay | Admitting: Nurse Practitioner

## 2021-03-03 DIAGNOSIS — G479 Sleep disorder, unspecified: Secondary | ICD-10-CM | POA: Diagnosis not present

## 2021-03-03 DIAGNOSIS — R413 Other amnesia: Secondary | ICD-10-CM

## 2021-03-03 DIAGNOSIS — R5381 Other malaise: Secondary | ICD-10-CM

## 2021-03-03 DIAGNOSIS — N401 Enlarged prostate with lower urinary tract symptoms: Secondary | ICD-10-CM | POA: Diagnosis not present

## 2021-03-03 DIAGNOSIS — R3915 Urgency of urination: Secondary | ICD-10-CM | POA: Diagnosis not present

## 2021-03-03 DIAGNOSIS — Z8616 Personal history of COVID-19: Secondary | ICD-10-CM

## 2021-03-03 NOTE — Patient Instructions (Addendum)
History of Covid Memory loss Fatigue Deconditioning  Sleep disturbance:  Stay well hydrated  Stay active  Deep breathing exercises  May take tylenol for fever or pain  Will place referral for PT  Will place referral for speech therapy for cognitive rehabilitation   Will place referral for neurology   Follow up:  Follow up in 3 months or sooner if needed

## 2021-03-03 NOTE — Progress Notes (Signed)
Virtual Visit via Telephone Note  I connected with Jonathan [name] on 03/03/21 at  9:10 AM EDT by telephone and verified that I am speaking with the correct person using two identifiers.  Location: Patient: home Provider: office   I discussed the limitations, risks, security and privacy concerns of performing an evaluation and management service by telephone and the availability of in person appointments. I also discussed with the patient that there may be a patient responsible charge related to this service. The patient expressed understanding and agreed to proceed.   History of Present Illness:  Patient presents today for [place] today for post-COVID care clinic visit through [facility].  Patient tested positive for COVID in November 2020.  Patient was hospitalized at that time.  He states that he continues to have ongoing fatigue, memory loss, difficulty with decision-making, poor sleep, and body aches.  Patient has had a cardiac work-up.  Patient has been having ongoing visits with neuropsychology.  Patient did previously have physical therapy for frequent falls but states that this did not help with his fatigue.  We discussed that we can place a new order for physical therapy to see if that will help.  We can also refer him to speech therapy for cognitive evaluation and a neurology referral to see if any further testing needs to be done.  Patient is being currently seen through pain clinic for body aches. Denies f/c/s, n/v/d, hemoptysis, PND, chest pain or edema.       Observations/Objective:  Vitals with BMI 02/04/2021 01/03/2021 12/20/2020  Height 5\' 7"  5\' 7"  5\' 7"   Weight 200 lbs 197 lbs 194 lbs  BMI 31.32 38.93 73.42  Systolic 876 811 572  Diastolic 72 52 74  Pulse 61 67 50  Some encounter information is confidential and restricted. Go to Review Flowsheets activity to see all data.      Assessment and Plan:  History of Covid Memory loss Fatigue Deconditioning  Sleep  disturbance:  Stay well hydrated  Stay active  Deep breathing exercises  May take tylenol for fever or pain  Will place referral for PT  Will place referral for speech therapy for cognitive rehabilitation   Will place referral for neurology   Follow up:  Follow up in 3 months or sooner if needed    I discussed the assessment and treatment plan with the patient. The patient was provided an opportunity to ask questions and all were answered. The patient agreed with the plan and demonstrated an understanding of the instructions.   The patient was advised to call back or seek an in-person evaluation if the symptoms worsen or if the condition fails to improve as anticipated.  I provided 23 minutes of non-face-to-face time during this encounter.   Fenton Foy, NP

## 2021-03-07 DIAGNOSIS — M79641 Pain in right hand: Secondary | ICD-10-CM | POA: Diagnosis not present

## 2021-03-10 DIAGNOSIS — M47817 Spondylosis without myelopathy or radiculopathy, lumbosacral region: Secondary | ICD-10-CM | POA: Diagnosis not present

## 2021-03-11 DIAGNOSIS — H401132 Primary open-angle glaucoma, bilateral, moderate stage: Secondary | ICD-10-CM | POA: Diagnosis not present

## 2021-03-13 ENCOUNTER — Other Ambulatory Visit: Payer: Self-pay | Admitting: Internal Medicine

## 2021-03-16 ENCOUNTER — Other Ambulatory Visit: Payer: Self-pay | Admitting: Internal Medicine

## 2021-03-25 ENCOUNTER — Other Ambulatory Visit: Payer: Self-pay

## 2021-03-25 ENCOUNTER — Ambulatory Visit (INDEPENDENT_AMBULATORY_CARE_PROVIDER_SITE_OTHER): Payer: Medicare Other | Admitting: Pharmacist

## 2021-03-25 DIAGNOSIS — E782 Mixed hyperlipidemia: Secondary | ICD-10-CM

## 2021-03-25 DIAGNOSIS — F419 Anxiety disorder, unspecified: Secondary | ICD-10-CM

## 2021-03-25 DIAGNOSIS — I482 Chronic atrial fibrillation, unspecified: Secondary | ICD-10-CM

## 2021-03-25 DIAGNOSIS — R351 Nocturia: Secondary | ICD-10-CM

## 2021-03-25 DIAGNOSIS — N401 Enlarged prostate with lower urinary tract symptoms: Secondary | ICD-10-CM

## 2021-03-25 DIAGNOSIS — M797 Fibromyalgia: Secondary | ICD-10-CM

## 2021-03-25 DIAGNOSIS — I10 Essential (primary) hypertension: Secondary | ICD-10-CM

## 2021-03-25 DIAGNOSIS — F330 Major depressive disorder, recurrent, mild: Secondary | ICD-10-CM

## 2021-03-25 DIAGNOSIS — E1142 Type 2 diabetes mellitus with diabetic polyneuropathy: Secondary | ICD-10-CM

## 2021-03-25 MED ORDER — RYBELSUS 7 MG PO TABS: 7.0000 mg | ORAL_TABLET | Freq: Every day | ORAL | 3 refills | Status: AC

## 2021-03-25 NOTE — Patient Instructions (Signed)
Visit Information  Phone number for Pharmacist: [phone]   Goals Addressed             This Visit's Progress    Manage My Medicine       Timeframe:  Long-Range Goal Priority:  High Start Date:        12/26/20                     Expected End Date:    06/28/21                   Follow Up Date Jan 2023   - call for medicine refill 2 or 3 days before it runs out - call if I am sick and can't take my medicine - keep a list of all the medicines I take; vitamins and herbals too - use a pillbox to sort medicine  -Call [facility] customer service for replacement -Take Rybelsus 30 min before other morning medications.   Why is this important?   These steps will help you keep on track with your medicines.   Notes:         Care Plan : [place]  Updates made by [name], RPH since 03/25/2021 12:00 AM     Problem: Hypertension, Hyperlipidemia, Diabetes, Atrial Fibrillation, Anxiety, Overactive Bladder, and BPH, Fibromyalgia   Priority: High     Long-Range Goal: Disease management   Start Date: 07/31/2020  Expected End Date: 03/25/2022  This Visit's Progress: On track  Recent Progress: On track  Priority: High  Note:   Current Barriers:  Unable to independently monitor therapeutic efficacy Unable to maintain control of insomnia/mood  Pharmacist Clinical Goal(s):  Patient will achieve adherence to monitoring guidelines and medication adherence to achieve therapeutic efficacy maintain control of insomnia/mood as evidenced by patient report  through collaboration with PharmD and provider.   Interventions: 1:1 collaboration with Binnie Rail, MD regarding development and update of comprehensive plan of care as evidenced by provider attestation and co-signature Inter-disciplinary care team collaboration (see longitudinal plan of care) Comprehensive medication review performed; medication list updated in electronic medical  record  Hypertension / AFIB (BP goal < 140/90) Controlled - BP at goal/close to goal in recent office visits CHADSVASC = 6. Hx embolic stroke off of Eliquis in 2019. Hx SDH after fall on Eliquis (last 03/2020).  Current regimen:  HCTZ 12.5 mg daily Diltiazem CD 300 mg daily Diltiazem IR 30 mg PRN HR > 100 Eliquis 5 mg BID Interventions: Discussed BP goals and benefits of medications for prevention of heart attack / stroke Recommend to continue current medication   Diabetes (A1c goal < 7.5%) Controlled - A1c is at goal; pt endorses compliance with medications as below; he reports his freestyle libre readings are often 10-20 points lower than his finger stick readings Home BG readings: 100 (per CGM); 117 (per finger stick) Current regimen:  Metformin ER 750 mg twice a day Farxiga 10 mg daily Rybelsus 7 mg (Novo Cares) Freestyle Libre 2 Interventions: Counseled that CGM actually measures glucose levels in interstitial fluid, which is similar to blood glucose most of the time but does lag behind when blood sugar is changing by 5-15 minutes; pt is also taking Vitamin C 500 mg BID which may be interfering with CGM readings Provided [employer] customer service number so pt can request replacements when sensors malfunction Recommend to continue current medication  Overactive bladder / BPH Not ideally controlled - pt has  been off solifenacin for ~3 months and is not sure if urinary issues have changed; he does see a urologist who has referred him to another specialist that he will see in Nov 2022 Current regimen:  Silodosin 8 mg daily Previous med trials: tamslosin, doxasozin, solifenacin Interventions: Discontinue solifenacin Advised to keep appt with urology specialist next month   Anxiety / Insomnia Mild improvement  - pt reports some improvement in mood on higher dose of fluoxetine; still struggling with sleep Current regimen:  Fluoxetine 40 mg daily Melatonin 5 mg - 2 tab HS (10  mg) Past tried/failed medications: bupropion, sertraline, Belsomra, trazodone, duloxetine, buspar, ambien, sonata, lorazepam Interventions: Recommend to continue current medication  Fibromyalgia (Goal: manage pain) -Improving - pt reports he was prescribed Percocet per Dr Vira Blanco; he typically takes 1 per day, sometimes none at all -Current treatment  Pregabalin 75 mg BID Fluoxetine 40 mg daily Oxycodone-APAP 10-325 mg #90 per month -counseled on risks associated with chronic opioid use; advised to use lowest effective dose and avoid mixing with alcohol -Recommend to continue current medication  Health Maintenance -Vaccine gaps: Covid booster, flu -pt reports he got covid booster at [facility] a few weeks ago; he plans to get Flu vaccine by the end of October -Pt wanted to know which vitamins he could stop; advised to stop biotin since it has not improved nail health  Patient Goals/Self-Care Activities Patient will:  - take medications as prescribed -focus on medication adherence by pill box (medication cups) -Call [facility] customer service for replacement -Take Rybelsus 30 min before other morning medications.      Patient verbalizes understanding of instructions provided today and agrees to view in [place].  Telephone follow up appointment with pharmacy team member scheduled for: 3 months  Charlene Brooke, PharmD, [place], CPP Clinical Pharmacist [facility] [phone]

## 2021-03-25 NOTE — Progress Notes (Signed)
Chronic Care Management Pharmacy Note  03/25/2021 Name:  Jonathan [name] MRN:  [id number] DOB:  [birth year]  Summary: -Pt has been off solifenacin for ~3 months and reports urinary issues are about the same. He has f/u with urology specialist next month -Pt has been taking Rybelsus 7 mg, receiving through pt assistance -Freestyle Libre readings is often 10-20 points lower than finger stick; Counseled on interstitial glucose vs blood glucose  Recommendations/Changes made from today's visit: -Discontinue solifenacin -Updated Rybelsus dose to reflect what patient is taking -Advised pt to contact [employer] customer service for free replacement sensors   Subjective: Jonathan [name] is an 87 y.o. year old male who is a primary patient of Burns, Claudina Lick, MD.  The CCM team was consulted for assistance with disease management and care coordination needs.    Engaged with patient by telephone for follow up visit in response to provider referral for pharmacy case management and/or care coordination services.   Consent to Services:  The patient was given information about Chronic Care Management services, agreed to services, and gave verbal consent prior to initiation of services.  Please see initial visit note for detailed documentation.   Patient Care Team: Binnie Rail, MD as PCP - General (Internal Medicine) Stanford Breed Denice Bors, MD as PCP - Cardiology (Cardiology) Council Mechanic, MD as Referring Physician (Family Medicine) Earnie Larsson, MD as Consulting Physician (Neurosurgery) Stanford Breed Denice Bors, MD as Consulting Physician (Cardiology) Michael Boston, MD as Consulting Physician (General Surgery) Karen Kays, NP as Nurse Practitioner (Nurse Practitioner) [name], [facility] as Pharmacist (Pharmacist)  Recent office visits: 02/04/21 Dr Quay Burow OV: Decreased HCTZ to 12.5 mg. D/C lorazepam, potassium. A1c 6.6  12/20/20 Dr Quay Burow OV: acute visit for pain, fibromyalgia, frequent  urination, worsening memory; restart Lyrica 75 mg BID; referred to podiatry for DM shoes; urination likely 2/2 increased sugar intake (Farxiga); referral to neurology for memory eval; ordered US abdomen  11/08/20 Dr Quay Burow OV: chronic f/u; BG elevated after cortisone shot; A1c 7.7%; started Farxiga 10 mg; increased Fluoxetine 30 to 40 mg; refilled nasal spray  06/18/19 Dr Quay Burow OV: c/o poor sleep, not currently taking medication. Retry melatonin. Probable gout flare, restart allopurinol 300 mg. Taper off duloxetine and start fluoxetine 20 mg daily. Consider therapist.   06/12/20 Dr Alain Marion OV: pt c/o insomnia, panic attacks, anxiety, sinusitis. Reduce Cymbalta to 1 daily. Discontinue Buspar. Rx low-dose lorazepam at night, stop alcohol. Rx'd ceftin.   05/24/20 pt message: panic attacks, rx'd buspar TID 04/18/20 Dr Quay Burow OV: chronic f/u. Stay off allopurinol. Restart Eliquis and f/u with cardiology.  Recent consult visits: 01/03/21 Dr Stanford Breed (cardiology): f/u afib. No med changes. 12/27/20 Dr Melvyn Novas (psychology): Neurocognitive testing. Dx'd mild cognitive impairment. 07/02/20 Dr Stanford Breed (cardiology): f/u Afib, continue Eliquis for now. If he has future falls Jonathan discontinue.  Hospital visits: 03/14/20 - 03/15/20 hospital admission: intracranial hemorrhage s/p multiple falls. [name]. Hold Eliquis (hx of TIA on previous holiday) and counseled on fall prevention (avoidance of Ambien, oxycodone, alcohol). Stopped Ambien and Sonata, rx'd trazodone.  Objective:  Lab Results  Component Value Date   CREATININE 0.92 02/18/2021   BUN 16 02/18/2021   GFR 75.75 02/18/2021   GFRNONAA >60 03/14/2020   GFRAA >60 03/14/2020   NA 139 02/18/2021   K 3.9 02/18/2021   CALCIUM 8.9 02/18/2021   CO2 30 02/18/2021    Lab Results  Component Value Date/Time   HGBA1C 6.6 (H) 02/18/2021 10:39 AM   HGBA1C 7.5 (  H) 12/20/2020 11:50 AM   GFR 75.75 02/18/2021 10:39 AM   GFR 54.59 (L) 12/20/2020 11:50 AM    MICROALBUR 98.2 11/08/2020 04:24 PM   MICROALBUR 28.1 (H) 08/11/2017 08:48 AM    Last diabetic Eye exam:  Lab Results  Component Value Date/Time   HMDIABEYEEXA No Retinopathy 02/01/2019 12:00 AM    Last diabetic Foot exam: No results found for: HMDIABFOOTEX   Lab Results  Component Value Date   CHOL 138 11/08/2020   HDL 42 11/08/2020   LDLCALC 65 11/08/2020   LDLDIRECT 51.0 08/14/2019   TRIG 245 (H) 11/08/2020   CHOLHDL 3.3 11/08/2020    Hepatic Function Latest Ref Rng & Units 02/18/2021 12/20/2020 11/08/2020  Total Protein 6.0 - 8.3 g/dL 6.0 6.9 6.6  Albumin 3.5 - 5.2 g/dL 3.9 4.6 -  AST 0 - 37 U/L _0 ALT 0 - 53 U/L _1 Alk Phosphatase 39 - 117 U/L 90 95 -  Total Bilirubin 0.2 - 1.2 mg/dL 0.3 0.6 0.3    Lab Results  Component Value Date/Time   TSH 2.62 12/20/2020 11:50 AM   TSH 0.92 11/08/2020 04:24 PM    CBC Latest Ref Rng & Units 11/08/2020 06/12/2020 03/15/2020  WBC 3.8 - 10.8 Thousand/uL 13.3(H) 6.6 13.3(H)  Hemoglobin 13.2 - 17.1 g/dL 13.3 13.4 12.5(L)  Hematocrit 38.5 - 50.0 % 40.3 40.6 37.3(L)  Platelets 140 - 400 Thousand/uL 128(L) 120.0(L) 126(L)    Lab Results  Component Value Date/Time   VD25OH 58 11/08/2020 04:24 PM   VD25OH 90.58 06/12/2020 11:31 AM   VD25OH 41 11/23/2012 10:52 AM    Clinical ASCVD: Yes  The ASCVD Risk score (Arnett DK, et al., 2019) failed to calculate for the following reasons:   The 2019 ASCVD risk score is only valid for ages 23 to 33    Depression screen PHQ 2/9 11/08/2020 06/12/2020 04/26/2020  Decreased Interest 0 1 0  Down, Depressed, Hopeless 1 2 0  PHQ - 2 Score 1 3 0  Altered sleeping 3 3 -  Tired, decreased energy 3 0 -  Change in appetite 0 0 -  Feeling bad or failure about yourself  0 0 -  Trouble concentrating 0 0 -  Moving slowly or fidgety/restless 0 0 -  Suicidal thoughts 0 0 -  PHQ-9 Score 7 6 -  Difficult doing work/chores Somewhat difficult - -  Some recent data might be hidden      CHA2DS2-VASc Score = 6  The patient's score is based upon: CHF History: 0 HTN History: 1 Diabetes History: 1 Stroke History: 2 Vascular Disease History: 0 Age Score: 2 Gender Score: 0     Social History   Tobacco Use  Smoking Status Former   Packs/day: 2.00   Years: 10.00   Pack years: 20.00   Types: Cigarettes   Quit date: 07/18/1975   Years since quitting: 45.7  Smokeless Tobacco Never   BP Readings from Last 3 Encounters:  02/04/21 126/72  01/03/21 (!) 132/52  12/20/20 130/74   Pulse Readings from Last 3 Encounters:  02/04/21 61  01/03/21 67  12/20/20 (!) 50   Wt Readings from Last 3 Encounters:  02/04/21 200 lb (90.7 kg)  01/03/21 197 lb (89.4 kg)  12/20/20 194 lb (88 kg)   BMI Readings from Last 3 Encounters:  02/04/21 31.32 kg/m  01/03/21 30.85 kg/m  12/20/20 30.38 kg/m   Assessment/Interventions: Review of patient past medical history, allergies, medications, health status, including  review of consultants reports, laboratory and other test data, was performed as part of comprehensive evaluation and provision of chronic care management services.   SDOH:  (Social Determinants of Health) assessments and interventions performed: Yes  SDOH Screenings   Alcohol Screen: Low Risk    Last Alcohol Screening Score (AUDIT): 2  Depression (PHQ2-9): Medium Risk   PHQ-2 Score: 7  Financial Resource Strain: Low Risk    Difficulty of Paying Living Expenses: Not hard at all  Food Insecurity: No Food Insecurity   Worried About Charity fundraiser in the Last Year: Never true   Ran Out of Food in the Last Year: Never true  Housing: Low Risk    Last Housing Risk Score: 0  Physical Activity: Inactive   Days of Exercise per Week: 0 days   Minutes of Exercise per Session: 0 min  Social Connections: Engineer, building services of Communication with Friends and Family: More than three times a week   Frequency of Social Gatherings with Friends and Family: Once a  week   Attends Religious Services: 1 to 4 times per year   Active Member of [employer] or Organizations: Yes   Attends Archivist Meetings: 1 to 4 times per year   Marital Status: Married  Stress: No Stress Concern Present   Feeling of Stress : Not at all  Tobacco Use: Medium Risk   Smoking Tobacco Use: Former   Smokeless Tobacco Use: Never  Transportation Needs: No Data processing manager (Medical): No   Lack of Transportation (Non-Medical): No    CCM Care Plan  Allergies  Allergen Reactions   Other Other (See Comments)    DUST-Other reaction(s): Respiratory distress    Medications Reviewed Today     Reviewed by [name], [facility] (Pharmacist) on 03/25/21 at 1430  Med List Status: <None>   Medication Order Taking? Sig Documenting Provider Last Dose Status Informant  acetaminophen (TYLENOL) 650 MG CR tablet [id number] Yes Take 650 mg by mouth in the morning and at bedtime. Pt takes 2 tabs 2 times daily [provider] Taking Active   allopurinol (ZYLOPRIM) 300 MG tablet [id number] Yes Take 1 tablet (300 mg total) by mouth every evening. For gout Binnie Rail, MD Taking Active   atorvastatin (LIPITOR) 20 MG tablet [id number] Yes Take 1 tablet (20 mg total) by mouth daily. For cholesterol Binnie Rail, MD Taking Active   bimatoprost (LUMIGAN) 0.01 % SOLN [id number] Yes Place 1 drop into both eyes at bedtime.  [provider] Taking Active Self  calcium-vitamin D (OSCAL WITH D) 500-200 MG-UNIT tablet [id number] Yes Take 1 tablet by mouth daily with breakfast. [provider] Taking Active Self  colchicine 0.6 MG tablet [id number] Yes Take 0.6 mg by mouth daily as needed (flareups).  [provider] Taking Active Self  Continuous Blood Gluc Receiver (FREESTYLE LIBRE 14 DAY READER) [place] [id number] Yes UAD to check sugars.  E11.65 Binnie Rail, MD Taking Active   Continuous Blood Gluc Sensor (FREESTYLE LIBRE 14 DAY  SENSOR) [place] [id number] Yes UAD to check sugars, E11.65 Binnie Rail, MD Taking Active   dapagliflozin propanediol (FARXIGA) 10 MG TABS tablet [id number] Yes Take 1 tablet (10 mg total) by mouth daily before breakfast. For diabetes Binnie Rail, MD Taking Active   diltiazem (CARDIZEM CD) 300 MG 24 hr capsule [id number] Yes TAKE ONE CAPSULE BY MOUTH DAILY Crenshaw, Denice Bors, MD Taking Active   diltiazem (CARDIZEM)  30 MG tablet [id number] Yes Take 1 tablet (30 mg total) by mouth 2 (two) times daily as needed (sustained elevated heart rates >100 bpm for > 20 minutes.). Darreld Mclean, PA-C Taking Active   ELIQUIS 5 MG TABS tablet [id number] Yes TAKE ONE TABLET BY MOUTH TWICE A DAY Crenshaw, Denice Bors, MD Taking Active   FLUoxetine (PROZAC) 40 MG capsule [id number] Yes Take 1 capsule (40 mg total) by mouth daily. For depression Binnie Rail, MD Taking Active   glucose blood test strip [id number] Yes Use to check blood sugar daily. E11.9 Binnie Rail, MD Taking Active   hydrochlorothiazide (HYDRODIURIL) 12.5 MG tablet [id number] Yes Take 1 tablet (12.5 mg total) by mouth daily. Binnie Rail, MD Taking Active   Lancets [place] [id number] Yes Use to check blood sugars daily. E11.9 Binnie Rail, MD Taking Active   levothyroxine (SYNTHROID) 112 MCG tablet [id number] Yes TAKE ONE TABLET BY MOUTH DAILY BEFORE BREAKFAST Binnie Rail, MD Taking Active   Melatonin 5 MG TABS [id number] Yes Take 10 mg by mouth at bedtime. [provider] Taking Active Self  metFORMIN (GLUCOPHAGE-XR) 750 MG 24 hr tablet [id number] Yes Take 1 tablet (750 mg total) by mouth in the morning and at bedtime. For diabetes Binnie Rail, MD Taking Active   mometasone (NASONEX) 50 MCG/ACT nasal spray [id number] Yes Place 2 sprays into the nose as needed (allergies). Binnie Rail, MD Taking Active   Multiple Vitamin (MULTIVITAMIN) tablet [id number] Yes Take 1 tablet by mouth daily. [provider] Taking Active Self   nitroGLYCERIN (NITROSTAT) 0.4 MG SL tablet [id number] Yes Place 1 tablet (0.4 mg total) under the tongue every 5 (five) minutes as needed for chest pain. Barrett, Evelene Croon, PA-C Taking Active Self  omeprazole (PRILOSEC) 20 MG capsule [id number] Yes TAKE ONE CAPSULE BY MOUTH EVERYDAY FOR HEARTBURN Burns, Claudina Lick, MD Taking Active   oxyCODONE-acetaminophen (PERCOCET) 10-325 MG tablet [id number] Yes Take 1 tablet by mouth every 8 (eight) hours as needed for pain. [provider] Taking Active   pregabalin (LYRICA) 75 MG capsule [id number] Yes TAKE ONE CAPSULE BY MOUTH TWICE A DAY Burns, Claudina Lick, MD Taking Active   RESTASIS 0.05 % ophthalmic emulsion [id number] Yes Place 1 drop into both eyes 2 (two) times daily.  [provider] Taking Active Self  sildenafil (REVATIO) 20 MG tablet [id number] Yes Take 40-100 mg by mouth daily as needed. [provider] Taking Active Self  silodosin (RAPAFLO) 8 MG CAPS capsule [id number] Yes Take 8 mg by mouth daily. [provider] Taking Active   vitamin C (ASCORBIC ACID) 500 MG tablet [id number] Yes Take 500 mg by mouth 2 (two) times daily.  [provider] Taking Active Self  VITAMIN D, CHOLECALCIFEROL, PO [id number] Yes Take 25 mcg by mouth daily.  [provider] Taking Active Self            Patient Active Problem List   Diagnosis Date Noted   Aortic atherosclerosis ([place]) 02/04/2021   Mild cognitive impairment 01/04/2021   Fatty liver 12/29/2020   Thrombocytopenia 12/20/2020   Urinary frequency 12/20/2020   Arthritis of hand 11/08/2020   Generalized anxiety disorder 06/12/2020   Recurrent falls 03/25/2020   SDH (subdural hematoma) 03/21/2020   Leukocytosis 03/14/2020   Intracranial hemorrhage    Rotator cuff arthropathy of left shoulder 07/21/2019   Dupuytren's disease of palm 07/12/2019   Sebaceous cyst 06/27/2019   History of COVID-19 05/08/2019   Obstructive sleep  apnea    Asymmetrical left  sensorineural hearing loss 07/12/2018   Chronic anticoagulation 12/21/2017   Degeneration of thoracic intervertebral disc 09/21/2017   Thoracic back pain 08/10/2017   Degenerative joint disease of hand 07/14/2017   Osteoarthritis    Hypoxia    Fibromyalgia    Fatty tumor    History of cataract    BPH (benign prostatic hyperplasia)    Chronic atrial fibrillation    Insomnia 03/10/2017   Chronic pain, legs and back 03/10/2017   Hyperlipidemia 03/10/2017   Obesity (BMI 30.0-34.9) 12/08/2016   Major depressive disorder 09/07/2016   Spondylolisthesis 09/07/2016   Hypothyroidism 03/08/2016   GERD (gastroesophageal reflux disease) 03/08/2016   Gout 03/08/2016   Glaucoma 03/08/2016   Type 2 diabetes mellitus 03/08/2016   Hereditary and idiopathic peripheral neuropathy 08/28/2015   TIA (transient ischemic attack) 08/28/2015   Essential hypertension 12/04/2011   Allergic rhinitis, seasonal 12/04/2011   COPD (chronic obstructive pulmonary disease)  07/18/2011    Immunization History  Administered Date(s) Administered   Fluad Quad(high Dose 65+) 03/25/2020   Hepatitis A 07/25/2007, 01/22/2008   Hepatitis B 04/30/1988, 06/02/1988, 10/28/1988   IPV 05/15/1996   Influenza Split 02/19/2012, 03/13/2016   Influenza Whole 02/17/2011, 03/13/2013   Influenza, High Dose Seasonal PF 03/10/2017, 02/15/2018, 02/09/2019   Influenza,inj,Quad PF,6+ Mos 03/15/2015   Influenza,inj,quad, With Preservative 02/14/2019   Meningococcal Polysaccharide 05/15/1996   Moderna Sars-Covid-2 Vaccination 07/15/2019, 08/12/2019, 02/15/2020   Pneumococcal Conjugate-13 12/08/2016   Pneumococcal-Unspecified 02/01/2006   Td 01/14/1995   Tdap 03/06/2018   Zoster Recombinat (Shingrix) 01/15/2017, 01/04/2018   Zoster, Live 02/01/2006    Conditions to be addressed/monitored:  Hypertension, Hyperlipidemia, Diabetes, Atrial Fibrillation, Anxiety, Overactive Bladder, and BPH, Fibromyalgia  Care Plan : [place]  Updates made by [name], [place] since 03/25/2021 12:00 AM     Problem: Hypertension, Hyperlipidemia, Diabetes, Atrial Fibrillation, Anxiety, Overactive Bladder, and BPH, Fibromyalgia   Priority: High     Long-Range Goal: Disease management   Start Date: 07/31/2020  Expected End Date: 03/25/2022  This Visit's Progress: On track  Recent Progress: On track  Priority: High  Note:   Current Barriers:  Unable to independently monitor therapeutic efficacy Unable to maintain control of insomnia/mood  Pharmacist Clinical Goal(s):  Patient Jonathan achieve adherence to monitoring guidelines and medication adherence to achieve therapeutic efficacy maintain control of insomnia/mood as evidenced by patient report  through collaboration with PharmD and provider.   Interventions: 1:1 collaboration with Binnie Rail, MD regarding development and update of comprehensive plan of care as evidenced by provider attestation and co-signature Inter-disciplinary care team collaboration (see longitudinal plan of care) Comprehensive medication review performed; medication list updated in electronic medical record  Hypertension / AFIB (BP goal < 140/90) Controlled - BP at goal/close to goal in recent office visits CHADSVASC = 6. Hx embolic stroke off of Eliquis in 2019. Hx SDH after fall on Eliquis (last 03/2020).  Current regimen:  HCTZ 12.5 mg daily Diltiazem CD 300 mg daily Diltiazem IR 30 mg PRN HR > 100 Eliquis 5 mg BID Interventions: Discussed BP goals and benefits of medications for prevention of heart attack / stroke Recommend to continue current medication   Diabetes (A1c goal < 7.5%) Controlled - A1c is at goal; pt endorses compliance with medications as below; he reports his freestyle libre readings are often 10-20 points lower than his finger stick readings Home BG readings: 100 (per CGM); 117 (per finger stick) Current regimen:  Metformin ER 750 mg twice a day Farxiga  10 mg daily Rybelsus 7 mg (Novo Cares) Freestyle Libre 2 Interventions: Counseled that CGM actually measures glucose levels in interstitial fluid, which is similar to blood glucose most of the time but does lag behind when blood sugar is changing by 5-15 minutes; pt is also taking Vitamin C 500 mg BID which may be interfering with CGM readings Provided Freestyle customer service number so pt can request replacements when sensors malfunction Recommend to continue current medication  Overactive bladder / BPH Not ideally controlled - pt has been off solifenacin for ~3 months and is not sure if urinary issues have changed; he does see a urologist who has referred him to another specialist that he Jonathan see in Nov 2022 Current regimen:  Silodosin 8 mg daily Previous med trials: tamslosin, doxasozin, solifenacin Interventions: Discontinue solifenacin Advised to keep appt with urology specialist next month   Anxiety / Insomnia Mild improvement  - pt reports some improvement in mood on higher dose of fluoxetine; still struggling with sleep Current regimen:  Fluoxetine 40 mg daily Melatonin 5 mg - 2 tab HS (10 mg) Past tried/failed medications: bupropion, sertraline, Belsomra, trazodone, duloxetine, buspar, ambien, sonata, lorazepam Interventions: Recommend to continue current medication  Fibromyalgia (Goal: manage pain) -Improving - pt reports he was prescribed Percocet per Dr Vira Blanco; he typically takes 1 per day, sometimes none at all -Current treatment  Pregabalin 75 mg BID Fluoxetine 40 mg daily Oxycodone-APAP 10-325 mg #90 per month -counseled on risks associated with chronic opioid use; advised to use lowest effective dose and avoid mixing with alcohol -Recommend to continue current medication  Health Maintenance -Vaccine gaps: Covid booster, flu -pt reports he got covid booster at [facility] a few weeks ago; he plans to get Flu vaccine by the end of October -Pt wanted to know which  vitamins he could stop; advised to stop biotin since it has not improved nail health  Patient Goals/Self-Care Activities Patient Jonathan:  - take medications as prescribed -focus on medication adherence by pill box (medication cups) -Call [facility] customer service for replacement -Take Rybelsus 30 min before other morning medications.     Medication Assistance:  Rybelsus [employer]) - approved through 06/14/21 Wilder Glade ([place]) - denied July 2022 due to income too high  Compliance/Adherence/Medication fill history: Care Gaps: Dexa scan (due 10/15/19) Eye exam (due 02/01/20)  Star-Rating Drugs: Atorvastatin - LF 12/17/20 Farxiga - LF 12/20/20 x 90 ds Rybelsus - via Novo Cares Metformin - LF 01/02/21 x 90 ds  Patient's preferred pharmacy is:  [facility] [id number], [name] [address] Phone: [phone] Fax: [phone]  Uses pill box? Yes Pt endorses 90% compliance  We discussed: Current pharmacy is preferred with insurance plan and patient is satisfied with pharmacy services Patient decided to: Continue current medication management strategy  Care Plan and Follow Up Patient Decision:  Patient agrees to Care Plan and Follow-up.  Plan: Telephone follow up appointment with care management team member scheduled for:  3 months  Charlene Brooke, PharmD, [place], CPP Clinical Pharmacist [facility] [phone]

## 2021-03-26 ENCOUNTER — Encounter: Payer: Self-pay | Admitting: Internal Medicine

## 2021-03-26 DIAGNOSIS — Z8781 Personal history of (healed) traumatic fracture: Secondary | ICD-10-CM

## 2021-03-26 DIAGNOSIS — Z13820 Encounter for screening for osteoporosis: Secondary | ICD-10-CM

## 2021-03-27 ENCOUNTER — Telehealth: Payer: Self-pay

## 2021-03-27 DIAGNOSIS — M720 Palmar fascial fibromatosis [Dupuytren]: Secondary | ICD-10-CM | POA: Diagnosis not present

## 2021-03-27 DIAGNOSIS — M13841 Other specified arthritis, right hand: Secondary | ICD-10-CM | POA: Diagnosis not present

## 2021-03-27 DIAGNOSIS — M19041 Primary osteoarthritis, right hand: Secondary | ICD-10-CM

## 2021-03-27 DIAGNOSIS — M79641 Pain in right hand: Secondary | ICD-10-CM | POA: Diagnosis not present

## 2021-03-27 HISTORY — DX: Primary osteoarthritis, right hand: M19.041

## 2021-03-27 NOTE — Telephone Encounter (Signed)
Patient notified that patient assistance received today and left up front for pick up.

## 2021-04-03 DIAGNOSIS — M47817 Spondylosis without myelopathy or radiculopathy, lumbosacral region: Secondary | ICD-10-CM | POA: Diagnosis not present

## 2021-04-06 ENCOUNTER — Encounter: Payer: Self-pay | Admitting: Internal Medicine

## 2021-04-06 NOTE — Progress Notes (Addendum)
Subjective:    Patient ID: Jonathan [name], male    DOB: [birth year], 87 y.o.   MRN: [id number]  This visit occurred during the SARS-CoV-2 public health emergency.  Safety protocols were in place, including screening questions prior to the visit, additional usage of staff PPE, and extensive cleaning of exam room while observing appropriate contact time as indicated for disinfecting solutions.     HPI The patient is here for follow up from 2 months ago.  Urinary freq - we decreased his hctz to 12.5mg  and held his potassium.  I advised low sodium diet.  He has not seen any change in his urination - he is seeing urology.    Insomnia - was sleeping 10-12 hrs a day.  Two months ago we stopped his ativan.  He is still taking the lyrica 75 mg bid.  He will occasionally sleep 12 hours but only if he did not sleep much the night prior.  He has more difficulty falling asleep - sometimes he does not fall asleep until 3 am. He sometimes has to sleep during the day because he is nonfunctional. He tries to limit it to an hour.     He has had some shooting pains in his calf when he lays down.  It lasts a lasts 1-2 seconds.  He has cramping in his thighs when he starts to walk after sitting     In the past year has had 2-3 episodes of fecal incontinence.  His recent episode his stool was more the consistency of oatmeal.  It occurred when he stood up to go to the bathroom to urinate.  He was not even aware that he had to have a bowel movement.  He states typically his stool is more formed.   Medications and allergies reviewed with patient and updated if appropriate.  Patient Active Problem List   Diagnosis Date Noted   Aortic atherosclerosis ([place]) 02/04/2021   Mild cognitive impairment 01/04/2021   Fatty liver 12/29/2020   Thrombocytopenia 12/20/2020   Urinary frequency 12/20/2020   Arthritis of hand 11/08/2020   Generalized anxiety disorder 06/12/2020   Recurrent falls 03/25/2020   SDH  (subdural hematoma) 03/21/2020   Leukocytosis 03/14/2020   Intracranial hemorrhage    Rotator cuff arthropathy of left shoulder 07/21/2019   Dupuytren's disease of palm 07/12/2019   Sebaceous cyst 06/27/2019   History of COVID-19 05/08/2019   Obstructive sleep apnea    Asymmetrical left sensorineural hearing loss 07/12/2018   Chronic anticoagulation 12/21/2017   Degeneration of thoracic intervertebral disc 09/21/2017   Thoracic back pain 08/10/2017   Degenerative joint disease of hand 07/14/2017   Osteoarthritis    Hypoxia    Fibromyalgia    Fatty tumor    History of cataract    BPH (benign prostatic hyperplasia)    Chronic atrial fibrillation    Insomnia 03/10/2017   Chronic pain, legs and back 03/10/2017   Hyperlipidemia 03/10/2017   Obesity (BMI 30.0-34.9) 12/08/2016   Major depressive disorder 09/07/2016   Spondylolisthesis 09/07/2016   Hypothyroidism 03/08/2016   GERD (gastroesophageal reflux disease) 03/08/2016   Gout 03/08/2016   Glaucoma 03/08/2016   Type 2 diabetes mellitus 03/08/2016   Hereditary and idiopathic peripheral neuropathy 08/28/2015   TIA (transient ischemic attack) 08/28/2015   Essential hypertension 12/04/2011   Allergic rhinitis, seasonal 12/04/2011   COPD (chronic obstructive pulmonary disease)  07/18/2011    Current Outpatient Medications on File Prior to Visit  Medication Sig Dispense Refill  acetaminophen (TYLENOL) 650 MG CR tablet Take 650 mg by mouth in the morning and at bedtime. Pt takes 2 tabs 2 times daily     allopurinol (ZYLOPRIM) 300 MG tablet Take 1 tablet (300 mg total) by mouth every evening. For gout 90 tablet 3   atorvastatin (LIPITOR) 20 MG tablet Take 1 tablet (20 mg total) by mouth daily. For cholesterol 90 tablet 1   bimatoprost (LUMIGAN) 0.01 % SOLN Place 1 drop into both eyes at bedtime.      calcium-vitamin D (OSCAL WITH D) 500-200 MG-UNIT tablet Take 1 tablet by mouth daily with breakfast.     colchicine 0.6 MG tablet  Take 0.6 mg by mouth daily as needed (flareups).      Continuous Blood Gluc Receiver (FREESTYLE LIBRE 14 DAY READER) DEVI UAD to check sugars.  E11.65 1 each 0   Continuous Blood Gluc Sensor (FREESTYLE LIBRE 14 DAY SENSOR) MISC UAD to check sugars, E11.65 2 each 5   dapagliflozin propanediol (FARXIGA) 10 MG TABS tablet Take 1 tablet (10 mg total) by mouth daily before breakfast. For diabetes 30 tablet 5   diltiazem (CARDIZEM CD) 300 MG 24 hr capsule TAKE ONE CAPSULE BY MOUTH DAILY 90 capsule 3   diltiazem (CARDIZEM) 30 MG tablet Take 1 tablet (30 mg total) by mouth 2 (two) times daily as needed (sustained elevated heart rates >100 bpm for > 20 minutes.). 30 tablet 2   ELIQUIS 5 MG TABS tablet TAKE ONE TABLET BY MOUTH TWICE A DAY 180 tablet 1   FLUoxetine (PROZAC) 40 MG capsule Take 1 capsule (40 mg total) by mouth daily. For depression 90 capsule 3   glucose blood test strip Use to check blood sugar daily. E11.9 100 each 3   hydrochlorothiazide (HYDRODIURIL) 12.5 MG tablet Take 1 tablet (12.5 mg total) by mouth daily. 90 tablet 3   Lancets MISC Use to check blood sugars daily. E11.9 100 each 3   levothyroxine (SYNTHROID) 112 MCG tablet TAKE ONE TABLET BY MOUTH DAILY BEFORE BREAKFAST 90 tablet 1   Melatonin 5 MG TABS Take 10 mg by mouth at bedtime.     metFORMIN (GLUCOPHAGE-XR) 750 MG 24 hr tablet Take 1 tablet (750 mg total) by mouth in the morning and at bedtime. For diabetes 180 tablet 1   mometasone (NASONEX) 50 MCG/ACT nasal spray Place 2 sprays into the nose as needed (allergies). 17 g 5   Multiple Vitamin (MULTIVITAMIN) tablet Take 1 tablet by mouth daily.     nitroGLYCERIN (NITROSTAT) 0.4 MG SL tablet Place 1 tablet (0.4 mg total) under the tongue every 5 (five) minutes as needed for chest pain. 90 tablet 3   omeprazole (PRILOSEC) 20 MG capsule TAKE ONE CAPSULE BY MOUTH EVERYDAY FOR HEARTBURN 90 capsule 1   oxyCODONE-acetaminophen (PERCOCET) 10-325 MG tablet Take 1 tablet by mouth every 8  (eight) hours as needed for pain.     pregabalin (LYRICA) 75 MG capsule TAKE ONE CAPSULE BY MOUTH TWICE A DAY 60 capsule 0   RESTASIS 0.05 % ophthalmic emulsion Place 1 drop into both eyes 2 (two) times daily.      Semaglutide (RYBELSUS) 7 MG TABS Take 7 mg by mouth daily. [employer] pt assistance 30 tablet 3   sildenafil (REVATIO) 20 MG tablet Take 40-100 mg by mouth daily as needed.     silodosin (RAPAFLO) 8 MG CAPS capsule Take 8 mg by mouth daily.     vitamin C (ASCORBIC ACID) 500 MG tablet Take  500 mg by mouth 2 (two) times daily.      VITAMIN D, CHOLECALCIFEROL, PO Take 25 mcg by mouth daily.      No current facility-administered medications on file prior to visit.    Past Medical History:  Diagnosis Date   Acute encephalopathy 05/08/2019   Allergic rhinitis, seasonal 12/04/2011   Arthritis of hand 11/08/2020   BPH (benign prostatic hyperplasia)    Bronchitis    Cataract    Chronic anticoagulation 12/21/2017   CHADS VASC=4, Eliquis stopped Sept 2021- recurrent falls with SDH   Chronic atrial fibrillation    On Eliquis   Chronic pain, legs and back 03/10/2017   Preferred pain management Leg and back pain   Compression fracture of thoracic vertebra 09/21/2017   COPD (chronic obstructive pulmonary disease)    2 liters O2 HS   Degeneration of thoracic intervertebral disc 09/21/2017   Degenerative joint disease of hand 07/14/2017   Dupuytren's disease of palm 07/12/2019   Essential hypertension 12/04/2011   Fatty tumor    waste and back   Fibromyalgia    Generalized anxiety disorder 06/12/2020   12/21 He is reporting severe insomnia and anxiety symptoms.  Options are limited.  We will try a low-dose lorazepam at night and during the day for anxiety and panic attacks.  He will stop lorazepam if problems.  He will stop drinking alcohol.  Discontinue BuSpar.  Reduce Cymbalta to 1 a day.   GERD (gastroesophageal reflux disease)    Glaucoma 03/08/2016   Gout 03/08/2016    Hereditary and idiopathic peripheral neuropathy 08/28/2015   History of COVID-19 05/08/2019   2021 Post-COVID sx's -" brain fog" Try [place]'s mane supplement and B complex with niacin for neuropathy   Hyperlipidemia 03/10/2017   Hypothyroidism    Hypoxia    Insomnia    Intracranial hemorrhage    Leukocytosis 03/14/2020   Major depressive disorder 09/07/2016   Obstructive sleep apnea    Osteoarthritis    Osteoporosis    Recurrent falls 03/25/2020   PT. Treat neuropathy, insomnia.  Reduce Cymbalta to 1 a day.  Discontinue BuSpar.   Rotator cuff arthropathy of left shoulder 07/21/2019   SDH (subdural hematoma) 03/21/2020   Recurrent SDH secondary to falls at home- Sept 2019 and again 03/14/2020- Eliquis stopped.   Sebaceous cyst 06/27/2019   Spinal compression fracture seventh vertebre   Spondylolisthesis 09/07/2016   On fosamax - for about one year - Dr Alyson Ingles  10/14/17 dexa: normal dexa -- spine 2.1,   RFN -0.9,  LFN   -0.7   - no comparison on file, previous fracture      Thoracic back pain 08/10/2017   Thrombocytopenia 12/20/2020   TIA (transient ischemic attack) 08/28/2015   Type 2 diabetes mellitus 03/08/2016    Past Surgical History:  Procedure Laterality Date   APPENDECTOMY  age 89   [place] Right    early 2000s   CATARACT EXTRACTION     bilateral   CHOLECYSTECTOMY  age 7   EYE SURGERY     FOOT ARTHRODESIS Right 02/02/2013   Procedure: RIGHT HALLUX METATARSAL PHALANGEAL JOINT ARTHRODESIS ;  Surgeon: Wylene Simmer, MD;  Location: [facility];  Service: Orthopedics;  Laterality: Right;   INGUINAL HERNIA REPAIR  age 62   rt side   NASAL CONCHA BULLOSA RESECTION  age 59   PROSTATE SURGERY     SHOULDER ARTHROSCOPY W/ ROTATOR CUFF REPAIR Right    early 2000s   tonsil  VASECTOMY  age 47    Social History   Socioeconomic History   Marital status: Married    Spouse name: Not on file   Number of children: 2   Years of education: 73    Highest education level: Master's degree (e.g., MA, MS, MEng, MEd, MSW, MBA)  Occupational History   Occupation: RETIRED    Employer: RETIRED  Tobacco Use   Smoking status: Former    Packs/day: 2.00    Years: 10.00    Pack years: 20.00    Types: Cigarettes    Quit date: 07/18/1975    Years since quitting: 45.7   Smokeless tobacco: Never  Vaping Use   Vaping Use: Never used  Substance and Sexual Activity   Alcohol use: Yes    Comment: 1-2 drinks/day   Drug use: No   Sexual activity: Not Currently  Other Topics Concern   Not on file  Social History Narrative   Not on file   Social Determinants of Health   Financial Resource Strain: Low Risk    Difficulty of Paying Living Expenses: Not hard at all  Food Insecurity: No Food Insecurity   Worried About Charity fundraiser in the Last Year: Never true   [place] in the Last Year: Never true  Transportation Needs: No Transportation Needs   Lack of Transportation (Medical): No   Lack of Transportation (Non-Medical): No  Physical Activity: Inactive   Days of Exercise per Week: 0 days   Minutes of Exercise per Session: 0 min  Stress: No Stress Concern Present   Feeling of Stress : Not at all  Social Connections: Socially Integrated   Frequency of Communication with Friends and Family: More than three times a week   Frequency of Social Gatherings with Friends and Family: Once a week   Attends Religious Services: 1 to 4 times per year   Active Member of [employer] or Organizations: Yes   Attends Archivist Meetings: 1 to 4 times per year   Marital Status: Married    Family History  Problem Relation Age of Onset   Coronary artery disease Brother    Heart disease Father    Lung cancer Father    Kidney cancer Father    Prostate cancer Father    Arthritis Mother    Lung cancer Mother    Dementia Mother        Unspecified type, not Alzheimer's disease    Review of Systems  Constitutional:  Negative for fever.   Respiratory:  Negative for cough, shortness of breath and wheezing.   Cardiovascular:  Negative for chest pain, palpitations and leg swelling.  Gastrointestinal:  Positive for constipation and diarrhea (stools are variable). Negative for abdominal pain, blood in stool and nausea.  Neurological:  Negative for light-headedness and headaches.      Objective:   Vitals:   04/07/21 0936  BP: 118/60  Pulse: (!) 56  Temp: 98.2 F (36.8 C)  SpO2: 94%   BP Readings from Last 3 Encounters:  04/07/21 118/60  02/04/21 126/72  01/03/21 (!) 132/52   Wt Readings from Last 3 Encounters:  04/07/21 190 lb (86.2 kg)  02/04/21 200 lb (90.7 kg)  01/03/21 197 lb (89.4 kg)   Body mass index is 29.76 kg/m.    Depression screen [facility] 2/9 04/07/2021 11/08/2020 06/12/2020 04/26/2020 08/14/2019  Decreased Interest 0 0 1 0 2  Down, Depressed, Hopeless 0 1 2 0 2  PHQ - 2 Score 0  1 3 0 4  Altered sleeping 2 3 3  - 3  Tired, decreased energy 1 3 0 - 2  Change in appetite 0 0 0 - 2  Feeling bad or failure about yourself  0 0 0 - 2  Trouble concentrating 1 0 0 - 3  Moving slowly or fidgety/restless 0 0 0 - 0  Suicidal thoughts 0 0 0 - 0  PHQ-9 Score 4 7 6  - 16  Difficult doing work/chores Not difficult at all Somewhat difficult - - -  Some recent data might be hidden     GAD 7 : Generalized Anxiety Score 04/07/2021 11/08/2020 08/14/2019  Nervous, Anxious, on Edge 0 0 0  Control/stop worrying 0 1 0  Worry too much - different things 0 1 1  Trouble relaxing 0 0 1  Restless 0 0 0  Easily annoyed or irritable 0 1 1  Afraid - awful might happen 0 0 0  Total GAD 7 Score 0 3 3  Anxiety Difficulty - Not difficult at all -      Physical Exam    Constitutional: Appears well-developed and well-nourished. No distress.  HENT:  Head: Normocephalic and atraumatic.  Neck: Neck supple. No tracheal deviation present. No thyromegaly present.  No cervical lymphadenopathy Cardiovascular: Normal rate, regular  rhythm and normal heart sounds.   No murmur heard. No carotid bruit .  No edema Pulmonary/Chest: Effort normal and breath sounds normal. No respiratory distress. No has no wheezes. No rales.  Skin: Skin is warm and dry. Not diaphoretic.  Psychiatric: Normal mood and affect. Behavior is normal.   Lab Results  Component Value Date   WBC 13.3 (H) 11/08/2020   HGB 13.3 11/08/2020   HCT 40.3 11/08/2020   PLT 128 (L) 11/08/2020   GLUCOSE 163 (H) 02/18/2021   CHOL 138 11/08/2020   TRIG 245 (H) 11/08/2020   HDL 42 11/08/2020   LDLDIRECT 51.0 08/14/2019   LDLCALC 65 11/08/2020   ALT 18 02/18/2021   AST 18 02/18/2021   NA 139 02/18/2021   K 3.9 02/18/2021   CL 102 02/18/2021   CREATININE 0.92 02/18/2021   BUN 16 02/18/2021   CO2 30 02/18/2021   TSH 2.62 12/20/2020   INR 0.9 03/14/2020   HGBA1C 6.6 (H) 02/18/2021   MICROALBUR 98.2 11/08/2020      Assessment & Plan:      See Problem List for Assessment and Plan of chronic medical problems.

## 2021-04-06 NOTE — Patient Instructions (Addendum)
    Flu immunization administered today.      Medications changes include :   none      Please followup in 3 months

## 2021-04-07 ENCOUNTER — Other Ambulatory Visit: Payer: Self-pay

## 2021-04-07 ENCOUNTER — Ambulatory Visit (INDEPENDENT_AMBULATORY_CARE_PROVIDER_SITE_OTHER): Payer: Medicare Other | Admitting: Internal Medicine

## 2021-04-07 VITALS — BP 118/60 | HR 56 | Temp 98.2°F | Ht 67.0 in | Wt 190.0 lb

## 2021-04-07 DIAGNOSIS — F330 Major depressive disorder, recurrent, mild: Secondary | ICD-10-CM

## 2021-04-07 DIAGNOSIS — R350 Frequency of micturition: Secondary | ICD-10-CM

## 2021-04-07 DIAGNOSIS — Z1331 Encounter for screening for depression: Secondary | ICD-10-CM | POA: Diagnosis not present

## 2021-04-07 DIAGNOSIS — F411 Generalized anxiety disorder: Secondary | ICD-10-CM | POA: Diagnosis not present

## 2021-04-07 DIAGNOSIS — G4700 Insomnia, unspecified: Secondary | ICD-10-CM | POA: Diagnosis not present

## 2021-04-07 DIAGNOSIS — Z23 Encounter for immunization: Secondary | ICD-10-CM

## 2021-04-07 DIAGNOSIS — I10 Essential (primary) hypertension: Secondary | ICD-10-CM | POA: Diagnosis not present

## 2021-04-07 DIAGNOSIS — E1142 Type 2 diabetes mellitus with diabetic polyneuropathy: Secondary | ICD-10-CM

## 2021-04-07 NOTE — Assessment & Plan Note (Signed)
Chronic Well-controlled He is eating a low-carb/high-protein diet He has lost weight Recent A1c 6.6% This is first A1c that was well controlled so we will not make any changes to his medication at this time, but in 3 months may be able to further decrease medications Continue Farxiga 10 mg daily, Rybelsus 7 mg daily and metformin XR 7 mg twice daily and in the near future can consider increasing Rybelsus to 14 mg daily and decreasing or possibly discontinuing He does sometimes have his medications so would like to simplify his regimen over the next few months if possible

## 2021-04-07 NOTE — Assessment & Plan Note (Signed)
Chronic Screened for anxiety using GAD7 Scale.  No evidence of anxiety so therefore his anxiety is well controlled Continue fluoxetine 40 mg daily

## 2021-04-07 NOTE — Assessment & Plan Note (Signed)
Chronic Has had sleep issues for years Sleep is variable-sometimes does not sleep well at all and other times sleeps 12 hours Melatonin nightly Will avoid other sleep medications given his history of falls and amnesia related to sleep medications

## 2021-04-07 NOTE — Assessment & Plan Note (Signed)
Chronic Blood pressure very well controlled here today He does not check it regularly at home, but encouraged him to check it intermittently will not make any changes today, but discussed with him that if he does lose more weight we may be able to discontinue the HCTZ completely L diltiazem 300 mg daily, HCTZ 12.5 mg daily

## 2021-04-07 NOTE — Addendum Note (Signed)
Addended by: Marcina Millard on: 04/07/2021 04:39 PM   Modules accepted: Orders

## 2021-04-07 NOTE — Assessment & Plan Note (Signed)
Chronic No real improvement with decreasing HCTZ dose Following with urology and will see a specialist in a month or 2 Was told by his current urologist that he is a very small bladder Discussed trial of pelvic PT given few episodes of fecal incontinence, but he deferred-advised to discuss with urology if that would help him

## 2021-04-07 NOTE — Assessment & Plan Note (Signed)
Chronic Screened for depression using the PHQ 9 scale  - score of 4, which is improved.  His positive score is related to his insomnia not depression Depression is well controlled Continue fluoxetine 40 mg daily

## 2021-04-14 ENCOUNTER — Other Ambulatory Visit: Payer: Self-pay | Admitting: Internal Medicine

## 2021-04-14 DIAGNOSIS — I10 Essential (primary) hypertension: Secondary | ICD-10-CM | POA: Diagnosis not present

## 2021-04-14 DIAGNOSIS — E782 Mixed hyperlipidemia: Secondary | ICD-10-CM

## 2021-04-14 DIAGNOSIS — N401 Enlarged prostate with lower urinary tract symptoms: Secondary | ICD-10-CM

## 2021-04-14 DIAGNOSIS — I482 Chronic atrial fibrillation, unspecified: Secondary | ICD-10-CM

## 2021-04-14 DIAGNOSIS — R351 Nocturia: Secondary | ICD-10-CM | POA: Diagnosis not present

## 2021-04-14 DIAGNOSIS — E1142 Type 2 diabetes mellitus with diabetic polyneuropathy: Secondary | ICD-10-CM | POA: Diagnosis not present

## 2021-04-14 DIAGNOSIS — F330 Major depressive disorder, recurrent, mild: Secondary | ICD-10-CM | POA: Diagnosis not present

## 2021-04-17 ENCOUNTER — Telehealth: Payer: Self-pay | Admitting: Cardiology

## 2021-04-17 NOTE — Telephone Encounter (Signed)
Patient c/o Palpitations:  High priority if patient c/o lightheadedness, shortness of breath, or chest pain  How long have you had palpitations/irregular HR/ Afib? Are you having the symptoms now? Two days.. yes   Are you currently experiencing lightheadedness, SOB or CP? No   Do you have a history of afib (atrial fibrillation) or irregular heart rhythm? yes  Have you checked your BP or HR? (document readings if available): 145/70  Are you experiencing any other symptoms? No

## 2021-04-17 NOTE — Telephone Encounter (Signed)
Spoke with pt, he has been feeling weak and sleeping a lot and he checked his blood pressure and the machine reports irregular heart beat. The patient feels he maybe back in atrial fib. His bp is 145/70, he does not know what his heart rate is. He denies cp or SOB, just feels unsteady. He will check his heart rate and if elevated over 100 bpm he was encouraged to use the diltiazem 30 mg. He will see dr Stanford Breed Wednesday next week and he voiced understanding to call or go to the ER if his symptoms change or worsen.

## 2021-04-21 ENCOUNTER — Ambulatory Visit: Payer: Medicare Other

## 2021-04-21 ENCOUNTER — Ambulatory Visit: Payer: Medicare Other | Attending: Nurse Practitioner | Admitting: Physical Therapy

## 2021-04-21 ENCOUNTER — Other Ambulatory Visit: Payer: Self-pay

## 2021-04-21 ENCOUNTER — Encounter: Payer: Self-pay | Admitting: Physical Therapy

## 2021-04-21 DIAGNOSIS — R4701 Aphasia: Secondary | ICD-10-CM

## 2021-04-21 DIAGNOSIS — R2689 Other abnormalities of gait and mobility: Secondary | ICD-10-CM | POA: Insufficient documentation

## 2021-04-21 DIAGNOSIS — R2681 Unsteadiness on feet: Secondary | ICD-10-CM | POA: Insufficient documentation

## 2021-04-21 DIAGNOSIS — R41841 Cognitive communication deficit: Secondary | ICD-10-CM | POA: Diagnosis not present

## 2021-04-21 DIAGNOSIS — M6281 Muscle weakness (generalized): Secondary | ICD-10-CM | POA: Insufficient documentation

## 2021-04-21 NOTE — Patient Instructions (Addendum)
   [employer] handout provided to pt, and example provided with "sanitizer"

## 2021-04-21 NOTE — Progress Notes (Signed)
Repeat     HPI: FU atrial fibrillation. Previously with a fib but converted spontaneously to sinus rhythm. Patient also with significant COPD. Nuclear study July 2016 showed ejection fraction 56% and normal perfusion. Echocardiogram repeated August 2018 and showed normal LV function, grade 2 diastolic dysfunction and mild left atrial enlargement. Had fall September 2019 resulting in subdural hematoma. Again admitted 9/21 after falling and had subdural. Monitor 12/21 showed sinus with pacs, pvcs, paf pat and 4 beats NSVT.  Abdominal ultrasound July 2022 showed no aneurysm.  Since I last saw him, he feels as though he may have been in atrial fibrillation last week due to unsteady gait.  However he denies dyspnea, chest pain, palpitations or syncope.  Current Outpatient Medications  Medication Sig Dispense Refill   acetaminophen (TYLENOL) 650 MG CR tablet Take 650 mg by mouth in the morning and at bedtime. Pt takes 2 tabs 2 times daily     allopurinol (ZYLOPRIM) 300 MG tablet Take 1 tablet (300 mg total) by mouth every evening. For gout 90 tablet 3   atorvastatin (LIPITOR) 20 MG tablet Take 1 tablet (20 mg total) by mouth daily. For cholesterol 90 tablet 1   bimatoprost (LUMIGAN) 0.01 % SOLN Place 1 drop into both eyes at bedtime.      calcium-vitamin D (OSCAL WITH D) 500-200 MG-UNIT tablet Take 1 tablet by mouth daily with breakfast.     colchicine 0.6 MG tablet Take 0.6 mg by mouth daily as needed (flareups).      Continuous Blood Gluc Receiver (FREESTYLE LIBRE 14 DAY READER) DEVI UAD to check sugars.  E11.65 1 each 0   Continuous Blood Gluc Sensor (FREESTYLE LIBRE 14 DAY SENSOR) MISC UAD to check sugars, E11.65 2 each 5   dapagliflozin propanediol (FARXIGA) 10 MG TABS tablet Take 1 tablet (10 mg total) by mouth daily before breakfast. For diabetes 30 tablet 5   diltiazem (CARDIZEM CD) 300 MG 24 hr capsule TAKE ONE CAPSULE BY MOUTH DAILY 90 capsule 3   diltiazem (CARDIZEM) 30 MG tablet Take 1  tablet (30 mg total) by mouth 2 (two) times daily as needed (sustained elevated heart rates >100 bpm for > 20 minutes.). 30 tablet 2   ELIQUIS 5 MG TABS tablet TAKE ONE TABLET BY MOUTH TWICE A DAY 180 tablet 1   FLUoxetine (PROZAC) 40 MG capsule Take 1 capsule (40 mg total) by mouth daily. For depression 90 capsule 3   glucose blood test strip Use to check blood sugar daily. E11.9 100 each 3   hydrochlorothiazide (HYDRODIURIL) 12.5 MG tablet Take 1 tablet (12.5 mg total) by mouth daily. 90 tablet 3   Lancets MISC Use to check blood sugars daily. E11.9 100 each 3   levothyroxine (SYNTHROID) 112 MCG tablet TAKE ONE TABLET BY MOUTH DAILY BEFORE BREAKFAST 90 tablet 1   Melatonin 5 MG TABS Take 10 mg by mouth at bedtime.     metFORMIN (GLUCOPHAGE-XR) 750 MG 24 hr tablet Take 1 tablet (750 mg total) by mouth in the morning and at bedtime. For diabetes 180 tablet 1   mometasone (NASONEX) 50 MCG/ACT nasal spray Place 2 sprays into the nose as needed (allergies). 17 g 5   Multiple Vitamin (MULTIVITAMIN) tablet Take 1 tablet by mouth daily.     omeprazole (PRILOSEC) 20 MG capsule TAKE ONE CAPSULE BY MOUTH EVERYDAY FOR HEARTBURN 90 capsule 1   pregabalin (LYRICA) 75 MG capsule TAKE ONE CAPSULE BY MOUTH TWICE A DAY 60 capsule 0  RESTASIS 0.05 % ophthalmic emulsion Place 1 drop into both eyes 2 (two) times daily.      Semaglutide (RYBELSUS) 7 MG TABS Take 7 mg by mouth daily. Via [employer] pt assistance 30 tablet 3   silodosin (RAPAFLO) 8 MG CAPS capsule Take 8 mg by mouth daily.     vitamin C (ASCORBIC ACID) 500 MG tablet Take 500 mg by mouth 2 (two) times daily.      VITAMIN D, CHOLECALCIFEROL, PO Take 25 mcg by mouth daily.      nitroGLYCERIN (NITROSTAT) 0.4 MG SL tablet Place 1 tablet (0.4 mg total) under the tongue every 5 (five) minutes as needed for chest pain. 90 tablet 3   oxyCODONE-acetaminophen (PERCOCET) 10-325 MG tablet Take 1 tablet by mouth every 8 (eight) hours as needed for pain. (Patient  not taking: Reported on 04/23/2021)     sildenafil (REVATIO) 20 MG tablet Take 40-100 mg by mouth daily as needed. (Patient not taking: Reported on 04/23/2021)     No current facility-administered medications for this visit.     Past Medical History:  Diagnosis Date   Acute encephalopathy 05/08/2019   Allergic rhinitis, seasonal 12/04/2011   Arthritis of hand 11/08/2020   BPH (benign prostatic hyperplasia)    Bronchitis    Cataract    Chronic anticoagulation 12/21/2017   CHADS VASC=4, Eliquis stopped Sept 2021- recurrent falls with SDH   Chronic atrial fibrillation    On Eliquis   Chronic pain, legs and back 03/10/2017   Preferred pain management Leg and back pain   Compression fracture of thoracic vertebra 09/21/2017   COPD (chronic obstructive pulmonary disease)    2 liters O2 HS   Degeneration of thoracic intervertebral disc 09/21/2017   Degenerative joint disease of hand 07/14/2017   Dupuytren's disease of palm 07/12/2019   Essential hypertension 12/04/2011   Fatty tumor    waste and back   Fibromyalgia    Generalized anxiety disorder 06/12/2020   12/21 He is reporting severe insomnia and anxiety symptoms.  Options are limited.  We will try a low-dose lorazepam at night and during the day for anxiety and panic attacks.  He will stop lorazepam if problems.  He will stop drinking alcohol.  Discontinue BuSpar.  Reduce Cymbalta to 1 a day.   GERD (gastroesophageal reflux disease)    Glaucoma 03/08/2016   Gout 03/08/2016   Hereditary and idiopathic peripheral neuropathy 08/28/2015   History of COVID-19 05/08/2019   2021 Post-COVID sx's -" brain fog" Try Lion's mane supplement and B complex with niacin for neuropathy   Hyperlipidemia 03/10/2017   Hypothyroidism    Hypoxia    Insomnia    Intracranial hemorrhage    Leukocytosis 03/14/2020   Major depressive disorder 09/07/2016   Obstructive sleep apnea    Osteoarthritis    Osteoporosis    Recurrent falls 03/25/2020   PT.  Treat neuropathy, insomnia.  Reduce Cymbalta to 1 a day.  Discontinue BuSpar.   Rotator cuff arthropathy of left shoulder 07/21/2019   SDH (subdural hematoma) 03/21/2020   Recurrent SDH secondary to falls at home- Sept 2019 and again 03/14/2020- Eliquis stopped.   Sebaceous cyst 06/27/2019   Spinal compression fracture seventh vertebre   Spondylolisthesis 09/07/2016   On fosamax - for about one year - Dr Alyson Ingles  10/14/17 dexa: normal dexa -- spine 2.1,   RFN -0.9,  LFN   -0.7   - no comparison on file, previous fracture      Thoracic back  pain 08/10/2017   Thrombocytopenia 12/20/2020   TIA (transient ischemic attack) 08/28/2015   Type 2 diabetes mellitus 03/08/2016    Past Surgical History:  Procedure Laterality Date   APPENDECTOMY  age 64   CARPAL TUNNEL RELEASE Right    early 2000s   CATARACT EXTRACTION     bilateral   CHOLECYSTECTOMY  age 57   EYE SURGERY     FOOT ARTHRODESIS Right 02/02/2013   Procedure: RIGHT HALLUX METATARSAL PHALANGEAL JOINT ARTHRODESIS ;  Surgeon: Wylene Simmer, MD;  Location: [facility];  Service: Orthopedics;  Laterality: Right;   INGUINAL HERNIA REPAIR  age 66   rt side   NASAL CONCHA BULLOSA RESECTION  age 61   PROSTATE SURGERY     SHOULDER ARTHROSCOPY W/ ROTATOR CUFF REPAIR Right    early 2000s   tonsil     VASECTOMY  age 69    Social History   Socioeconomic History   Marital status: Married    Spouse name: Not on file   Number of children: 2   Years of education: 18   Highest education level: Master's degree (e.g., MA, MS, MEng, MEd, MSW, MBA)  Occupational History   Occupation: RETIRED    Employer: RETIRED  Tobacco Use   Smoking status: Former    Packs/day: 2.00    Years: 10.00    Pack years: 20.00    Types: Cigarettes    Quit date: 07/18/1975    Years since quitting: 45.7   Smokeless tobacco: Never  Vaping Use   Vaping Use: Never used  Substance and Sexual Activity   Alcohol use: Yes    Comment: 1-2 drinks/day    Drug use: No   Sexual activity: Not Currently  Other Topics Concern   Not on file  Social History Narrative   Not on file   Social Determinants of Health   Financial Resource Strain: Low Risk    Difficulty of Paying Living Expenses: Not hard at all  Food Insecurity: No Food Insecurity   Worried About Charity fundraiser in the Last Year: Never true   [place] in the Last Year: Never true  Transportation Needs: No Transportation Needs   Lack of Transportation (Medical): No   Lack of Transportation (Non-Medical): No  Physical Activity: Inactive   Days of Exercise per Week: 0 days   Minutes of Exercise per Session: 0 min  Stress: No Stress Concern Present   Feeling of Stress : Not at all  Social Connections: Socially Integrated   Frequency of Communication with Friends and Family: More than three times a week   Frequency of Social Gatherings with Friends and Family: Once a week   Attends Religious Services: 1 to 4 times per year   Active Member of [employer] or Organizations: Yes   Attends Archivist Meetings: 1 to 4 times per year   Marital Status: Married  Human resources officer Violence: Not on file    Family History  Problem Relation Age of Onset   Coronary artery disease Brother    Heart disease Father    Lung cancer Father    Kidney cancer Father    Prostate cancer Father    Arthritis Mother    Lung cancer Mother    Dementia Mother        Unspecified type, not Alzheimer's disease    ROS: no fevers or chills, productive cough, hemoptysis, dysphasia, odynophagia, melena, hematochezia, dysuria, hematuria, rash, seizure activity, orthopnea, PND, pedal edema,  claudication. Remaining systems are negative.  Physical Exam: Well-developed well-nourished in no acute distress.  Skin is warm and dry.  HEENT is normal.  Neck is supple.  Chest is clear to auscultation with normal expansion.  Cardiovascular exam is irregular Abdominal exam nontender or distended. No  masses palpated. Extremities show no edema. neuro grossly intact  ECG-atrial fibrillation at a rate of 90 disease or aberrantly conducted beats, cannot rule out septal infarct.  Personally reviewed  A/P  1 paroxysmal atrial fibrillation-patient is in atrial fibrillation today.  Long discussion today concerning options of rate control versus rhythm control.  He is essentially asymptomatic at this point as he denies dyspnea or fatigue and no palpitations.  I would favor rate control and anticoagulation if he remains asymptomatic.  If he develops symptoms would consider addition of antiarrhythmic followed by cardioversion.  I will have him seen back by an APP in 6 to 8 weeks to make sure that his symptoms are stable.  We will continue Cardizem at present dose.  Continue apixaban.  He has had no further falls.  2 hypertension-patient's blood pressure is controlled.  Continue present medications and follow.  3 hyperlipidemia-continue statin.  4 history of obstructive sleep apnea-continue CPAP.  Kirk Ruths, MD

## 2021-04-21 NOTE — Therapy (Signed)
[facility] [address] [place], [name], [address] Phone: [phone]   Fax:  [phone]  Speech Language Pathology Evaluation  Patient Details  Name: Jonathan [name] MRN: [id number] Date of Birth: [birth year] Referring Provider (SLP): Lazaro Arms, NP   Encounter Date: 04/21/2021   End of Session - 04/21/21 1625     Visit Number 1    Number of Visits 3    Date for SLP Re-Evaluation 05/16/21    SLP Start Time 2025    SLP Stop Time  1530    SLP Time Calculation (min) 41 min    Activity Tolerance Patient tolerated treatment well             Past Medical History:  Diagnosis Date   Acute encephalopathy 05/08/2019   Allergic rhinitis, seasonal 12/04/2011   Arthritis of hand 11/08/2020   BPH (benign prostatic hyperplasia)    Bronchitis    Cataract    Chronic anticoagulation 12/21/2017   CHADS VASC=4, Eliquis stopped Sept 2021- recurrent falls with SDH   Chronic atrial fibrillation    On Eliquis   Chronic pain, legs and back 03/10/2017   Preferred pain management Leg and back pain   Compression fracture of thoracic vertebra 09/21/2017   COPD (chronic obstructive pulmonary disease)    2 liters O2 HS   Degeneration of thoracic intervertebral disc 09/21/2017   Degenerative joint disease of hand 07/14/2017   Dupuytren's disease of palm 07/12/2019   Essential hypertension 12/04/2011   Fatty tumor    waste and back   Fibromyalgia    Generalized anxiety disorder 06/12/2020   12/21 He is reporting severe insomnia and anxiety symptoms.  Options are limited.  We will try a low-dose lorazepam at night and during the day for anxiety and panic attacks.  He will stop lorazepam if problems.  He will stop drinking alcohol.  Discontinue BuSpar.  Reduce Cymbalta to 1 a day.   GERD (gastroesophageal reflux disease)    Glaucoma 03/08/2016   Gout 03/08/2016   Hereditary and idiopathic peripheral neuropathy 08/28/2015   History of  COVID-19 05/08/2019   2021 Post-COVID sx's -" brain fog" Try Lion's mane supplement and B complex with niacin for neuropathy   Hyperlipidemia 03/10/2017   Hypothyroidism    Hypoxia    Insomnia    Intracranial hemorrhage    Leukocytosis 03/14/2020   Major depressive disorder 09/07/2016   Obstructive sleep apnea    Osteoarthritis    Osteoporosis    Recurrent falls 03/25/2020   PT. Treat neuropathy, insomnia.  Reduce Cymbalta to 1 a day.  Discontinue BuSpar.   Rotator cuff arthropathy of left shoulder 07/21/2019   SDH (subdural hematoma) 03/21/2020   Recurrent SDH secondary to falls at home- Sept 2019 and again 03/14/2020- Eliquis stopped.   Sebaceous cyst 06/27/2019   Spinal compression fracture seventh vertebre   Spondylolisthesis 09/07/2016   On fosamax - for about one year - Dr Alyson Ingles  10/14/17 dexa: normal dexa -- spine 2.1,   RFN -0.9,  LFN   -0.7   - no comparison on file, previous fracture      Thoracic back pain 08/10/2017   Thrombocytopenia 12/20/2020   TIA (transient ischemic attack) 08/28/2015   Type 2 diabetes mellitus 03/08/2016    Past Surgical History:  Procedure Laterality Date   APPENDECTOMY  age 76   Rake Right    early 2000s   CATARACT EXTRACTION     bilateral  CHOLECYSTECTOMY  age 44   EYE SURGERY     FOOT ARTHRODESIS Right 02/02/2013   Procedure: RIGHT HALLUX METATARSAL PHALANGEAL JOINT ARTHRODESIS ;  Surgeon: Wylene Simmer, MD;  Location: [facility];  Service: Orthopedics;  Laterality: Right;   INGUINAL HERNIA REPAIR  age 12   rt side   NASAL CONCHA BULLOSA RESECTION  age 46   PROSTATE SURGERY     SHOULDER ARTHROSCOPY W/ ROTATOR CUFF REPAIR Right    early 2000s   tonsil     VASECTOMY  age 84    There were no vitals filed for this visit.   Subjective Assessment - 04/21/21 1558     Subjective "If I don't write it down or put it into my notes app I'm more apt not to remember it than remember it." "I also can't remember  names of things, people."    Currently in Pain? No/denies                SLP Evaluation [facility] - 04/21/21 1457       SLP Visit Information   SLP Received On 04/21/21    Referring Provider (SLP) Lazaro Arms, NP    Onset Date 2020    Medical Diagnosis Memory loss      Subjective   Patient/Family Stated Goal Improve ability to recall      General Information   HPI Pt uses calendar, notes app, wife sending text for compensations for recall. Pt states it is difficult to keep details of characters in a novel in his mind. He also reports difficulty with word finding in conversation, and recall of people's names.      Prior Functional Status   Cognitive/Linguistic Baseline Baseline deficits    Baseline deficit details memory, at least two years    Type of [place] With Spouse    Available Support Family    Vocation Retired      Associate Professor   Overall Cognitive Status Impaired/Different from baseline   reported - pt without definitive diagnosis for memory deficit and other cognitive communication difficulties. Pt is successfully using compensations for recall.     Auditory Comprehension   Overall Auditory Comprehension Appears within functional limits for tasks assessed      Verbal Expression   Overall Verbal Expression Impaired    Level of Generative/Spontaneous Verbalization Conversation    Effective Techniques Semantic cues   pt used self cueing during conversation   Other Verbal Expression Comments In conversation pt exhibited rare mild hesitations and pauses of mostly <1 to 2 seconds, his longest pause was for 4 seconds and then pt said, "[place], why couldn't I think of '[place]'?" Because of this, SLP proceeded with [place] - 2 (BNT-2). Pt scored 57/60, greater than one standard deviation above the mean, higher than WNL, for 58-17 year olds (pt is 44).   Conversation was effective as pt used compensatory strategies of synonym and circumlocution  appropriately and successfully.     Oral Motor/Sensory Function   Overall Oral Motor/Sensory Function Appears within functional limits for tasks assessed      Motor Speech   Overall Motor Speech Appears within functional limits for tasks assessed      Standardized Assessments   Standardized Assessments  Boston Naming Test-2nd edition                             SLP Education - 04/21/21  [id number]     Education Details evaluation results (WNL word finding, and pt using memory compensations SLP would normally teach pts), semantic feature analysis, SLP to educate pt on one more anomia treatment pt can do at home (verb network strengthening treatment)    Person(s) Educated Patient    Methods Explanation;Demonstration;Verbal cues;Handout    Comprehension Verbalized understanding;Returned demonstration;Need further instruction;Verbal cues required                SLP Long Term Goals - 04/21/21 1647       SLP LONG TERM GOAL #1   Title pt will demo independence with both semantic feature analysis (SFA) and verb network strengthening treatment (VNeST)    Time 2    Period --   sessions   Status New    Target Date 05/16/21      SLP LONG TERM GOAL #2   Title pt will report that he feels more confident than prior to [place] with his conversational ability    Time 2    Period --   sessions   Status New    Target Date 05/16/21              Plan - 04/21/21 1625     Clinical Impression Statement Pt complains of symptoms of word finding in conversation and SLP observed pt with rare mild anomic sx today today successfully solved by synonym and circumlocution. Pt's longest pause was 4 seconds for "[place]." SLP also ascertained pt is successfully using compensatory measures for reduced short term memory (see eval for details). Pt had neuropsych testing in summer 2022 and other than mild decline in cognitive flexibility and visuospatial function "a consistent decline between  the two evaluations cannot be discerned." Variable findings were ascertained for verbal expressive language during neuropsych testing as well. "Phonemic fluency was average, semantic fluency as below average, and confrontation naming was well below average on a screening instrument but well above average across a more comprehensive assessment." SLP encouraged pt to follow Dr. Gustavus Bryant suggestion for retest in 18-24 months after his previous evaluation to track any decline in cognitive (and cognitive- linguistic) function. Due to BNT-2 scores as above WNL, but pt's frustration with his mild anomia in conversation SLP will teach pt two language networking techniques in hopes this will assist him in his conversation. Pt agrees with this plan    Speech Therapy Frequency --   every other week   Duration --   x2 vists   Treatment/Interventions SLP instruction and feedback;Patient/family education;Compensatory techniques;Language facilitation;Multimodal communcation approach    Potential to Achieve Goals Good    SLP Home Exercise Plan semantic feature analysis    Consulted and Agree with Plan of Care Patient             Patient will benefit from skilled therapeutic intervention in order to improve the following deficits and impairments:   Aphasia  Cognitive communication deficit    Problem List Patient Active Problem List   Diagnosis Date Noted   Aortic atherosclerosis ([place]) 02/04/2021   Mild cognitive impairment 01/04/2021   Fatty liver 12/29/2020   Thrombocytopenia 12/20/2020   Urinary frequency 12/20/2020   Arthritis of hand 11/08/2020   Generalized anxiety disorder 06/12/2020   Recurrent falls 03/25/2020   SDH (subdural hematoma) 03/21/2020   Leukocytosis 03/14/2020   Intracranial hemorrhage    Rotator cuff arthropathy of left shoulder 07/21/2019   Dupuytren's disease of palm 07/12/2019   Sebaceous cyst 06/27/2019   History of COVID-19 05/08/2019  Obstructive sleep apnea     Asymmetrical left sensorineural hearing loss 07/12/2018   Chronic anticoagulation 12/21/2017   Degeneration of thoracic intervertebral disc 09/21/2017   Thoracic back pain 08/10/2017   Degenerative joint disease of hand 07/14/2017   Osteoarthritis    Hypoxia    Fibromyalgia    Fatty tumor    BPH (benign prostatic hyperplasia)    Chronic atrial fibrillation    Insomnia 03/10/2017   Chronic pain, legs and back 03/10/2017   Hyperlipidemia 03/10/2017   Obesity (BMI 30.0-34.9) 12/08/2016   Major depressive disorder 09/07/2016   Spondylolisthesis 09/07/2016   Hypothyroidism 03/08/2016   GERD (gastroesophageal reflux disease) 03/08/2016   Gout 03/08/2016   Glaucoma 03/08/2016   Type 2 diabetes mellitus 03/08/2016   Hereditary and idiopathic peripheral neuropathy 08/28/2015   TIA (transient ischemic attack) 08/28/2015   Essential hypertension 12/04/2011   Allergic rhinitis, seasonal 12/04/2011   COPD (chronic obstructive pulmonary disease)  07/18/2011    [facility] ,MS, CCC-SLP  04/21/2021, 4:49 PM  [facility] [address], [place], [name], [address] Phone: [phone]   Fax:  [phone]  Name: Jonathan [name] MRN: [id number] Date of Birth: May 15, 1933

## 2021-04-22 NOTE — Therapy (Signed)
[facility] [place] [address], [place], [name], [address] Phone: [phone]   Fax:  [phone]  Physical Therapy Evaluation  Patient Details  Name: Jonathan [name] MRN: [id number] Date of Birth: 1932-10-08 Referring Provider (PT): Lazaro Arms, NP   Encounter Date: 04/21/2021   PT End of Session - 04/22/21 0758     Visit Number 1    Number of Visits 9    Date for PT Re-Evaluation 06/13/21    Authorization Type Medicare/BCBS    PT Start Time 1540   Pt late arriving from speech therapy appt   PT Stop Time 1620    PT Time Calculation (min) 40 min    Activity Tolerance Patient tolerated treatment well    Behavior During Therapy [facility] for tasks assessed/performed             Past Medical History:  Diagnosis Date   Acute encephalopathy 05/08/2019   Allergic rhinitis, seasonal 12/04/2011   Arthritis of hand 11/08/2020   BPH (benign prostatic hyperplasia)    Bronchitis    Cataract    Chronic anticoagulation 12/21/2017   CHADS VASC=4, Eliquis stopped Sept 2021- recurrent falls with SDH   Chronic atrial fibrillation    On Eliquis   Chronic pain, legs and back 03/10/2017   Preferred pain management Leg and back pain   Compression fracture of thoracic vertebra 09/21/2017   COPD (chronic obstructive pulmonary disease)    2 liters O2 HS   Degeneration of thoracic intervertebral disc 09/21/2017   Degenerative joint disease of hand 07/14/2017   Dupuytren's disease of palm 07/12/2019   Essential hypertension 12/04/2011   Fatty tumor    waste and back   Fibromyalgia    Generalized anxiety disorder 06/12/2020   12/21 He is reporting severe insomnia and anxiety symptoms.  Options are limited.  We will try a low-dose lorazepam at night and during the day for anxiety and panic attacks.  He will stop lorazepam if problems.  He will stop drinking alcohol.  Discontinue BuSpar.  Reduce Cymbalta to 1 a day.   GERD (gastroesophageal reflux  disease)    Glaucoma 03/08/2016   Gout 03/08/2016   Hereditary and idiopathic peripheral neuropathy 08/28/2015   History of COVID-19 05/08/2019   2021 Post-COVID sx's -" brain fog" Try Lion's mane supplement and B complex with niacin for neuropathy   Hyperlipidemia 03/10/2017   Hypothyroidism    Hypoxia    Insomnia    Intracranial hemorrhage    Leukocytosis 03/14/2020   Major depressive disorder 09/07/2016   Obstructive sleep apnea    Osteoarthritis    Osteoporosis    Recurrent falls 03/25/2020   PT. Treat neuropathy, insomnia.  Reduce Cymbalta to 1 a day.  Discontinue BuSpar.   Rotator cuff arthropathy of left shoulder 07/21/2019   SDH (subdural hematoma) 03/21/2020   Recurrent SDH secondary to falls at home- Sept 2019 and again 03/14/2020- Eliquis stopped.   Sebaceous cyst 06/27/2019   Spinal compression fracture seventh vertebre   Spondylolisthesis 09/07/2016   On fosamax - for about one year - Dr Alyson Ingles  10/14/17 dexa: normal dexa -- spine 2.1,   RFN -0.9,  LFN   -0.7   - no comparison on file, previous fracture      Thoracic back pain 08/10/2017   Thrombocytopenia 12/20/2020   TIA (transient ischemic attack) 08/28/2015   Type 2 diabetes mellitus 03/08/2016    Past Surgical History:  Procedure Laterality Date   APPENDECTOMY  age  14   CARPAL TUNNEL RELEASE Right    early 2000s   CATARACT EXTRACTION     bilateral   CHOLECYSTECTOMY  age 67   EYE SURGERY     FOOT ARTHRODESIS Right 02/02/2013   Procedure: RIGHT HALLUX METATARSAL PHALANGEAL JOINT ARTHRODESIS ;  Surgeon: Wylene Simmer, MD;  Location: [facility];  Service: Orthopedics;  Laterality: Right;   INGUINAL HERNIA REPAIR  age 50   rt side   NASAL CONCHA BULLOSA RESECTION  age 76   PROSTATE SURGERY     SHOULDER ARTHROSCOPY W/ ROTATOR CUFF REPAIR Right    early 2000s   tonsil     VASECTOMY  age 53    There were no vitals filed for this visit.    Subjective Assessment - 04/21/21 1541      Subjective My wife has done some therapy for post-Covid, and I have questioned whether I might need it.  Had Covid in November 2020; feel decrease in strength, some balance issues.  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  Will be moving to [facility] and will be able to use their exercise equipment.  No falls in the past 6 months; have had a lot of near-falls, with turning quickly.  Doesn't use device.    Pertinent History PMH: pAF on Eliquis (to see cardiologist on 04/23/21), chronic pain on oxycodone and Ambien, previous SDH, COPD on home O2 noct, HTN, hypothyroidism, OSA on CPAP, and DM    Patient Stated Goals Pt's goal for therapy is to improve balance, strengthening, and walking.    Currently in Pain? Yes    Pain Score 2     Pain Location Generalized    Pain Descriptors / Indicators Aching    Pain Type Chronic pain    Pain Onset More than a month ago    Pain Frequency Constant    Aggravating Factors  standing too long    Pain Relieving Factors sitting                [facility] PT Assessment - 04/21/21 1548       Assessment   Medical Diagnosis physical deconditioning, hx of Covid    Referring Provider (PT) Lazaro Arms, NP    Onset Date/Surgical Date 03/03/21    Hand Dominance Right      Precautions   Precautions Fall      Balance Screen   Has the patient fallen in the past 6 months No    Has the patient had a decrease in activity level because of a fear of falling?  No    Is the patient reluctant to leave their home because of a fear of falling?  No      Home Environment   Living Environment Private residence    Living Arrangements Spouse/significant other   wife is disabled   Available Help at Discharge Family    Type of [place] to enter    Entrance Stairs-Number of Steps 3    Entrance Stairs-Rails Right;Left;Cannot reach both    [place] Two level    Additional Comments has stair lift  for wife who is disabled; to move to [facility] in the spring 2023      Prior Function   Level of [place] Retired    Leisure Has used the gym in the past; now has access  to gym/pool at [facility]      Observation/Other Assessments   Focus on Therapeutic Outcomes (FOTO)  NA      ROM / Strength   AROM / PROM / Strength AROM;Strength      AROM   Overall AROM  Within functional limits for tasks performed      Strength   Overall Strength Within functional limits for tasks performed    Overall Strength Comments with MMT for BLEs      Transfers   Transfers Sit to Stand;Stand to Sit    Sit to Stand 5: Supervision    Five time sit to stand comments  12.38    Stand to Sit 5: Supervision      Ambulation/Gait   Ambulation/Gait Yes    Ambulation/Gait Assistance 6: Modified independent (Device/Increase time)    Ambulation Distance (Feet) 60 Feet   x 2   Assistive device None    Gait Pattern Step-through pattern;Wide base of support    Ambulation Surface Level;Indoor    Gait velocity 10.06 sec = 3.26 ft/sec      Standardized Balance Assessment   Standardized Balance Assessment Timed Up and Go Test;Dynamic Gait Index      Dynamic Gait Index   Level Surface Mild Impairment    Change in Gait Speed Normal    Gait with Horizontal Head Turns Mild Impairment    Gait with Vertical Head Turns Mild Impairment    Gait and Pivot Turn Mild Impairment    Step Over Obstacle Moderate Impairment    Step Around Obstacles Mild Impairment    Steps Moderate Impairment   per pt report, step-to pattern   Total Score 15    DGI comment: Scores <19/24 indicate increased fall risk      Timed Up and Go Test   TUG Normal TUG    Normal TUG (seconds) 11.38    TUG Comments Scores >13.5 sec indicate increased fall risk.      High Level Balance   High Level Balance Activites Other (comment)    High Level Balance Comments Standing solid surface EO 30 sec, EC 18 sec  with L and posterior lateral lean, needing PT to regain balance.  On foam:  EO 30 sec, EC 8 sec with posterior/L lateral lean.                        Objective measurements completed on examination: See above findings.                PT Education - 04/22/21 0758     Education Details PT eval results, POC    Methods Explanation    Comprehension Verbalized understanding              PT Short Term Goals - 04/22/21 0808       PT SHORT TERM GOAL #1   Title Pt will be independent with HEP for improved strength, balance, transfers, and gait.  TARGET 05/16/2021    Time 4    Period Weeks    Status New      PT SHORT TERM GOAL #2   Title Pt will improve 5x sit<>stand to less than or equal to 11.5 sec to demonstrate improved functional strength and transfer efficiency.    Baseline 12.38 sec with decreased forward lean to initiate    Time 4    Period Weeks    Status New      PT SHORT TERM GOAL #  3   Title 6MWT to be assessed and goal written as appropriate.    Time 4    Period Weeks    Status New               PT Long Term Goals - 04/22/21 0810       PT LONG TERM GOAL #1   Title Pt will be independent with HEP for improved strength, balance, transfers, and gait.  TARGET 06/13/2021    Time 8    Period Weeks    Status New      PT LONG TERM GOAL #2   Title Pt will improve DGI score to at least 19/24 to decrease fall risk.    Baseline 15/24    Time 8      PT LONG TERM GOAL #3   Title Patient will demo ability to complete situation 4 of M-CTSIB for >/= 15 seconds to improve balance.    Time 8    Period Weeks    Status New      PT LONG TERM GOAL #4   Title Pt will verbalize plans for continued community fitness upon d/c from PT to maximize gains made in therapy.    Time 8    Period Weeks    Status New                    Plan - 04/22/21 0801     Clinical Impression Statement Pt is an 87 yo male who presents to OPPT with dx of  physical deconditioning with history of Covid (04/2019).  He reports overall weakness and decreased balance in the past 1-2 years.  He presents to OPPT with decreased functional strength, decreased balance, decreased vestibular system use for balance, abnormal gait.  He is at fall risk per DGI score; he reports multiple near-falls, with veering to the left at times, but no falls.  He performs most of the household activities and is caregiver for his wife who is disabled.  He would benefit from skilled PT to address the above stated deficits for decreased fall risk, improved overall functional mobility.    Personal Factors and Comorbidities Comorbidity 3+    Comorbidities PMH: pAF on Eliquis, chronic pain on oxycodone and Ambien, previous SDH, COPD on home O2 noct, HTN, hypothyroidism, OSA on CPAP, and DM, peripheral neuropathy    Examination-Activity Limitations Locomotion Level;Transfers;Stand;Squat;Caring for Others    Examination-Participation Restrictions Meal Prep;Cleaning;Community Activity;Laundry    Stability/Clinical Decision Making Evolving/Moderate complexity    Clinical Decision Making Moderate    Rehab Potential Good    PT Frequency 1x / week    PT Duration 8 weeks   plus eval (1 additional tx session week of eval)   PT Treatment/Interventions ADLs/Self Care Home Management;Gait training;Functional mobility training;Therapeutic activities;Therapeutic exercise;Balance training;Neuromuscular re-education;Patient/family education    PT Next Visit Plan Assess 6 Minute walk test, including vitals and update goal.  Initiate HEP for walking program, balance, especially vestibular training for balance, sit<>stand with increased forward lean    Consulted and Agree with Plan of Care Patient             Patient will benefit from skilled therapeutic intervention in order to improve the following deficits and impairments:  Abnormal gait, Difficulty walking, Decreased balance, Decreased mobility,  Decreased strength  Visit Diagnosis: Unsteadiness on feet  Other abnormalities of gait and mobility  Muscle weakness (generalized)     Problem List Patient Active Problem List   Diagnosis Date  Noted   Aortic atherosclerosis ([place]) 02/04/2021   Mild cognitive impairment 01/04/2021   Fatty liver 12/29/2020   Thrombocytopenia 12/20/2020   Urinary frequency 12/20/2020   Arthritis of hand 11/08/2020   Generalized anxiety disorder 06/12/2020   Recurrent falls 03/25/2020   SDH (subdural hematoma) 03/21/2020   Leukocytosis 03/14/2020   Intracranial hemorrhage    Rotator cuff arthropathy of left shoulder 07/21/2019   Dupuytren's disease of palm 07/12/2019   Sebaceous cyst 06/27/2019   History of COVID-19 05/08/2019   Obstructive sleep apnea    Asymmetrical left sensorineural hearing loss 07/12/2018   Chronic anticoagulation 12/21/2017   Degeneration of thoracic intervertebral disc 09/21/2017   Thoracic back pain 08/10/2017   Degenerative joint disease of hand 07/14/2017   Osteoarthritis    Hypoxia    Fibromyalgia    Fatty tumor    BPH (benign prostatic hyperplasia)    Chronic atrial fibrillation    Insomnia 03/10/2017   Chronic pain, legs and back 03/10/2017   Hyperlipidemia 03/10/2017   Obesity (BMI 30.0-34.9) 12/08/2016   Major depressive disorder 09/07/2016   Spondylolisthesis 09/07/2016   Hypothyroidism 03/08/2016   GERD (gastroesophageal reflux disease) 03/08/2016   Gout 03/08/2016   Glaucoma 03/08/2016   Type 2 diabetes mellitus 03/08/2016   Hereditary and idiopathic peripheral neuropathy 08/28/2015   TIA (transient ischemic attack) 08/28/2015   Essential hypertension 12/04/2011   Allergic rhinitis, seasonal 12/04/2011   COPD (chronic obstructive pulmonary disease)  07/18/2011    Prayan Ulin W., PT 04/22/2021, 8:16 AM  [facility] [address] [place], [name], [address] Phone: [phone]   Fax:   [phone]  Name: Jonathan [name] MRN: [id number] Date of Birth: April 15, 1933

## 2021-04-23 ENCOUNTER — Ambulatory Visit (INDEPENDENT_AMBULATORY_CARE_PROVIDER_SITE_OTHER): Payer: Medicare Other | Admitting: Cardiology

## 2021-04-23 ENCOUNTER — Encounter: Payer: Medicare Other | Admitting: Psychology

## 2021-04-23 ENCOUNTER — Other Ambulatory Visit: Payer: Self-pay

## 2021-04-23 ENCOUNTER — Encounter: Payer: Self-pay | Admitting: Cardiology

## 2021-04-23 VITALS — BP 136/55 | HR 90 | Ht 66.5 in | Wt 184.6 lb

## 2021-04-23 DIAGNOSIS — I480 Paroxysmal atrial fibrillation: Secondary | ICD-10-CM | POA: Diagnosis not present

## 2021-04-23 DIAGNOSIS — I10 Essential (primary) hypertension: Secondary | ICD-10-CM

## 2021-04-23 DIAGNOSIS — G4733 Obstructive sleep apnea (adult) (pediatric): Secondary | ICD-10-CM | POA: Diagnosis not present

## 2021-04-23 DIAGNOSIS — E7800 Pure hypercholesterolemia, unspecified: Secondary | ICD-10-CM | POA: Diagnosis not present

## 2021-04-23 NOTE — Patient Instructions (Signed)
  Testing/Procedures:  Your physician has requested that you have an echocardiogram. Echocardiography is a painless test that uses sound waves to create images of your heart. It provides your doctor with information about the size and shape of your heart and how well your heart's chambers and valves are working. This procedure takes approximately one hour. There are no restrictions for this procedure. [place]   Follow-Up: At [facility], you and your health needs are our priority.  As part of our continuing mission to provide you with exceptional heart care, we have created designated Provider Care Teams.  These Care Teams include your primary Cardiologist (physician) and Advanced Practice Providers (APPs -  Physician Assistants and Nurse Practitioners) who all work together to provide you with the care you need, when you need it.  We recommend signing up for the patient portal called "MyChart".  Sign up information is provided on this After Visit Summary.  MyChart is used to connect with patients for Virtual Visits (Telemedicine).  Patients are able to view lab/test results, encounter notes, upcoming appointments, etc.  Non-urgent messages can be sent to your provider as well.   To learn more about what you can do with MyChart, go to [email or web address].    Your next appointment:   6 week(s)  The format for your next appointment:   In Person  Provider:   Coletta Memos, FNP or Sande Rives, PA-C    Then, Kirk Ruths, MD will plan to see you again in 6 month(s).

## 2021-04-25 ENCOUNTER — Other Ambulatory Visit: Payer: Self-pay

## 2021-04-25 ENCOUNTER — Ambulatory Visit: Payer: Medicare Other | Admitting: Physical Therapy

## 2021-04-25 ENCOUNTER — Encounter: Payer: Self-pay | Admitting: Physical Therapy

## 2021-04-25 DIAGNOSIS — R2681 Unsteadiness on feet: Secondary | ICD-10-CM

## 2021-04-25 DIAGNOSIS — R41841 Cognitive communication deficit: Secondary | ICD-10-CM | POA: Diagnosis not present

## 2021-04-25 DIAGNOSIS — M6281 Muscle weakness (generalized): Secondary | ICD-10-CM | POA: Diagnosis not present

## 2021-04-25 DIAGNOSIS — R2689 Other abnormalities of gait and mobility: Secondary | ICD-10-CM

## 2021-04-25 DIAGNOSIS — R4701 Aphasia: Secondary | ICD-10-CM | POA: Diagnosis not present

## 2021-04-25 NOTE — Patient Instructions (Addendum)
WALKING  Walking is a great form of exercise to increase your strength, endurance and overall fitness.  A walking program can help you start slowly and gradually build endurance as you go.  Everyone's ability is different, so each person's starting point will be different.  You do not have to follow them exactly.  The are just samples. You should simply find out what's right for you and stick to that program.   In the beginning, you'll start off walking 2-3 times a day for short distances.  As you get stronger, you'll be walking further at just 1-2 times per day.  A. You Can Walk For A Certain Length Of Time Each Day    Walk 6 minutes 3 times per day.  Increase 2 minutes every 3 days (3 times per day).  Work up to 25-30 minutes (1-2 times per day).   Example:   Day 1-2 6 minutes 3 times per day   Day 7-8 8 minutes 2-3 times per day   Day 13-14 10 minutes 1-2 times per day  B. You Can Walk For a Certain Distance Each Day     Distance can be substituted for time.    Example:   3 trips to mailbox (at road)   3 trips to corner of block   3 trips around the block  C. Go to local [employer] and use the track.    Walk for distance ____ around track  Or time ____ minutes  Please only do the exercises that your therapist has initialed and dated

## 2021-04-25 NOTE — Therapy (Signed)
[facility] [address] [place], [name], [address] Phone: [phone]   Fax:  [phone]  Physical Therapy Treatment  Patient Details  Name: Jonathan [name] MRN: [id number] Date of Birth: 85-Oct-1934 Referring Provider (PT): Lazaro Arms, NP   Encounter Date: 04/25/2021   PT End of Session - 04/25/21 1100     Visit Number 2    Number of Visits 9    Date for PT Re-Evaluation 06/13/21    Authorization Type Medicare/BCBS    PT Start Time 1021   pt late   PT Stop Time 1059    PT Time Calculation (min) 38 min    Equipment Utilized During Treatment Gait belt    Activity Tolerance Patient tolerated treatment well    Behavior During Therapy [facility] for tasks assessed/performed             Past Medical History:  Diagnosis Date   Acute encephalopathy 05/08/2019   Allergic rhinitis, seasonal 12/04/2011   Arthritis of hand 11/08/2020   BPH (benign prostatic hyperplasia)    Bronchitis    Cataract    Chronic anticoagulation 12/21/2017   CHADS VASC=4, Eliquis stopped Sept 2021- recurrent falls with SDH   Chronic atrial fibrillation    On Eliquis   Chronic pain, legs and back 03/10/2017   Preferred pain management Leg and back pain   Compression fracture of thoracic vertebra 09/21/2017   COPD (chronic obstructive pulmonary disease)    2 liters O2 HS   Degeneration of thoracic intervertebral disc 09/21/2017   Degenerative joint disease of hand 07/14/2017   Dupuytren's disease of palm 07/12/2019   Essential hypertension 12/04/2011   Fatty tumor    waste and back   Fibromyalgia    Generalized anxiety disorder 06/12/2020   12/21 He is reporting severe insomnia and anxiety symptoms.  Options are limited.  We will try a low-dose lorazepam at night and during the day for anxiety and panic attacks.  He will stop lorazepam if problems.  He will stop drinking alcohol.  Discontinue BuSpar.  Reduce Cymbalta to 1 a day.   GERD  (gastroesophageal reflux disease)    Glaucoma 03/08/2016   Gout 03/08/2016   Hereditary and idiopathic peripheral neuropathy 08/28/2015   History of COVID-19 05/08/2019   2021 Post-COVID sx's -" brain fog" Try Lion's mane supplement and B complex with niacin for neuropathy   Hyperlipidemia 03/10/2017   Hypothyroidism    Hypoxia    Insomnia    Intracranial hemorrhage    Leukocytosis 03/14/2020   Major depressive disorder 09/07/2016   Obstructive sleep apnea    Osteoarthritis    Osteoporosis    Recurrent falls 03/25/2020   PT. Treat neuropathy, insomnia.  Reduce Cymbalta to 1 a day.  Discontinue BuSpar.   Rotator cuff arthropathy of left shoulder 07/21/2019   SDH (subdural hematoma) 03/21/2020   Recurrent SDH secondary to falls at home- Sept 2019 and again 03/14/2020- Eliquis stopped.   Sebaceous cyst 06/27/2019   Spinal compression fracture seventh vertebre   Spondylolisthesis 09/07/2016   On fosamax - for about one year - Dr Alyson Ingles  10/14/17 dexa: normal dexa -- spine 2.1,   RFN -0.9,  LFN   -0.7   - no comparison on file, previous fracture      Thoracic back pain 08/10/2017   Thrombocytopenia 12/20/2020   TIA (transient ischemic attack) 08/28/2015   Type 2 diabetes mellitus 03/08/2016    Past Surgical History:  Procedure Laterality Date  APPENDECTOMY  age 58   CARPAL TUNNEL RELEASE Right    early 2000s   CATARACT EXTRACTION     bilateral   CHOLECYSTECTOMY  age 76   EYE SURGERY     FOOT ARTHRODESIS Right 02/02/2013   Procedure: RIGHT HALLUX METATARSAL PHALANGEAL JOINT ARTHRODESIS ;  Surgeon: Wylene Simmer, MD;  Location: [place];  Service: Orthopedics;  Laterality: Right;   INGUINAL HERNIA REPAIR  age 5   rt side   NASAL CONCHA BULLOSA RESECTION  age 77   PROSTATE SURGERY     SHOULDER ARTHROSCOPY W/ ROTATOR CUFF REPAIR Right    early 2000s   tonsil     VASECTOMY  age 36    There were no vitals filed for this visit.   Subjective Assessment -  04/25/21 1022     Subjective Has been in and out of a-fib- had a recent Cardiology appointment for this and has another one in a couple weeks.    Pertinent History PMH: pAF on Eliquis (to see cardiologist on 04/23/21), chronic pain on oxycodone and Ambien, previous SDH, COPD on home O2 noct, HTN, hypothyroidism, OSA on CPAP, and DM    Patient Stated Goals Pt's goal for therapy is to improve balance, strengthening, and walking.    Currently in Pain? Yes    Pain Score 3     Pain Location Generalized    Pain Descriptors / Indicators Aching    Pain Type Chronic pain                [facility] PT Assessment - 04/25/21 0001       6 Minute Walk- Baseline   6 Minute Walk- Baseline yes    BP (mmHg) 142/76    HR (bpm) 87    Modified Borg Scale for Dyspnea 0- Nothing at all      6 Minute walk- Post Test   6 Minute Walk Post Test yes    BP (mmHg) 148/81    HR (bpm) 94    Modified Borg Scale for Dyspnea 4- somewhat severe      6 minute walk test results    Endurance additional comments 983 ft; 299.6 m                           [facility] Adult PT Treatment/Exercise - 04/25/21 0001       Exercises   Exercises Knee/Hip      Knee/Hip Exercises: Seated   Sit to Sand 10 reps;without UE support;2 sets   green medball at chest; cues to scoot to EO chair                    PT Education - 04/25/21 1059     Education Details update to HEP-  Access Code: [email or web address]; edu on walking program and safe environments for walking and types of exercises to work on Education officer, environmental) Educated Patient    Methods Explanation;Demonstration;Tactile cues;Verbal cues;Handout    Comprehension Verbalized understanding;Returned demonstration              PT Short Term Goals - 04/22/21 7353       PT SHORT TERM GOAL #1   Title Pt will be independent with HEP for improved strength, balance, transfers, and gait.  TARGET 05/16/2021    Time 4    Period Weeks    Status New       PT SHORT TERM GOAL #2  Title Pt will improve 5x sit<>stand to less than or equal to 11.5 sec to demonstrate improved functional strength and transfer efficiency.    Baseline 12.38 sec with decreased forward lean to initiate    Time 4    Period Weeks    Status New      PT SHORT TERM GOAL #3   Title 6MWT to be assessed and goal written as appropriate.    Time 4    Period Weeks    Status New               PT Long Term Goals - 04/25/21 1101       PT LONG TERM GOAL #1   Title Pt will be independent with HEP for improved strength, balance, transfers, and gait.  TARGET 06/13/2021    Time 8    Period Weeks    Status On-going      PT LONG TERM GOAL #2   Title Pt will improve DGI score to at least 19/24 to decrease fall risk.    Baseline 15/24    Time 8    Status On-going      PT LONG TERM GOAL #3   Title Patient will demo ability to complete situation 4 of M-CTSIB for >/= 15 seconds to improve balance.    Time 8    Period Weeks    Status On-going      PT LONG TERM GOAL #4   Title Pt will verbalize plans for continued community fitness upon d/c from PT to maximize gains made in therapy.    Time 8    Period Weeks    Status On-going      PT LONG TERM GOAL #5   Title Patient to complete 463m on 6 minute walk test with LRAD.    Time 8    Period Weeks    Status New                   Plan - 04/25/21 1102     Clinical Impression Statement Patient arrived to session with report of being in and out of a-fib recently. Being followed by Cardiology. Asymptomatic today with exception of generalized body aches which he reports is present at baseline. Patient's score on 6 minute walk test demonstrated limited endurance, thus adjusted goals to improve gait speed and endurance. Tolerated without exceptional SOB or fatigue however. Reports hilly surface without sidewalks in current neighborhood which is not ideal for walking but notes that he has access to the gym at [facility] as he is moving there in April/May. Educated patient on types of exercise (aerobic vs. anaerobic) and importance of improving tolerance for aerobic activities post-COVID. Educated patient on safe environments to perform walking program. Patient reported understanding and without complaints at end of session.    Comorbidities PMH: pAF on Eliquis, chronic pain on oxycodone and Ambien, previous SDH, COPD on home O2 noct, HTN, hypothyroidism, OSA on CPAP, and DM, peripheral neuropathy    PT Treatment/Interventions ADLs/Self Care Home Management;Gait training;Functional mobility training;Therapeutic activities;Therapeutic exercise;Balance training;Neuromuscular re-education;Patient/family education    PT Next Visit Plan Initiate HEP for  balance, especially vestibular training for balance, sit<>stand with increased forward lean    Consulted and Agree with Plan of Care Patient             Patient will benefit from skilled therapeutic intervention in order to improve the following deficits and impairments:  Abnormal gait, Difficulty walking, Decreased balance, Decreased mobility, Decreased  strength  Visit Diagnosis: Unsteadiness on feet  Muscle weakness (generalized)  Other abnormalities of gait and mobility     Problem List Patient Active Problem List   Diagnosis Date Noted   Aortic atherosclerosis ([place]) 02/04/2021   Mild cognitive impairment 01/04/2021   Fatty liver 12/29/2020   Thrombocytopenia 12/20/2020   Urinary frequency 12/20/2020   Arthritis of hand 11/08/2020   Generalized anxiety disorder 06/12/2020   Recurrent falls 03/25/2020   SDH (subdural hematoma) 03/21/2020   Leukocytosis 03/14/2020   Intracranial hemorrhage    Rotator cuff arthropathy of left shoulder 07/21/2019   Dupuytren's disease of palm 07/12/2019   Sebaceous cyst 06/27/2019   History of COVID-19 05/08/2019   Obstructive sleep apnea    Asymmetrical left sensorineural hearing loss 07/12/2018   Chronic  anticoagulation 12/21/2017   Degeneration of thoracic intervertebral disc 09/21/2017   Thoracic back pain 08/10/2017   Degenerative joint disease of hand 07/14/2017   Osteoarthritis    Hypoxia    Fibromyalgia    Fatty tumor    BPH (benign prostatic hyperplasia)    Chronic atrial fibrillation    Insomnia 03/10/2017   Chronic pain, legs and back 03/10/2017   Hyperlipidemia 03/10/2017   Obesity (BMI 30.0-34.9) 12/08/2016   Major depressive disorder 09/07/2016   Spondylolisthesis 09/07/2016   Hypothyroidism 03/08/2016   GERD (gastroesophageal reflux disease) 03/08/2016   Gout 03/08/2016   Glaucoma 03/08/2016   Type 2 diabetes mellitus 03/08/2016   Hereditary and idiopathic peripheral neuropathy 08/28/2015   TIA (transient ischemic attack) 08/28/2015   Essential hypertension 12/04/2011   Allergic rhinitis, seasonal 12/04/2011   COPD (chronic obstructive pulmonary disease)  07/18/2011     Janene Harvey, PT, DPT 04/25/21 11:06 AM   [facility] [address] [place], [name], [address] Phone: [phone]   Fax:  [phone]  Name: Vencil Basnett MRN: [id number] Date of Birth: 19-Jan-1933

## 2021-04-28 ENCOUNTER — Telehealth: Payer: Self-pay

## 2021-04-28 NOTE — Progress Notes (Signed)
    Chronic Care Management Pharmacy Assistant   Name: Jonathan [name]  MRN: [id number] DOB: 1932/12/06  Patient is currently enrolled in [place] patient assistance program for the medication Rybelsus until date: 06/14/21 Patient would like to re-enroll in patient assistance for the next calendar year.  Renewal forms were completed on behalf of the patient. Left message for patient to come to sign forms in the office. Forms will be printed and placed at front desk.  [place] Pharmacist Assistant [phone]

## 2021-04-29 ENCOUNTER — Encounter: Payer: Self-pay | Admitting: Physical Therapy

## 2021-04-29 ENCOUNTER — Ambulatory Visit: Payer: Medicare Other

## 2021-04-29 ENCOUNTER — Other Ambulatory Visit: Payer: Self-pay

## 2021-04-29 ENCOUNTER — Ambulatory Visit: Payer: Medicare Other | Admitting: Physical Therapy

## 2021-04-29 DIAGNOSIS — R41841 Cognitive communication deficit: Secondary | ICD-10-CM

## 2021-04-29 DIAGNOSIS — R2681 Unsteadiness on feet: Secondary | ICD-10-CM | POA: Diagnosis not present

## 2021-04-29 DIAGNOSIS — M6281 Muscle weakness (generalized): Secondary | ICD-10-CM | POA: Diagnosis not present

## 2021-04-29 DIAGNOSIS — R2689 Other abnormalities of gait and mobility: Secondary | ICD-10-CM | POA: Diagnosis not present

## 2021-04-29 DIAGNOSIS — R4701 Aphasia: Secondary | ICD-10-CM | POA: Diagnosis not present

## 2021-04-29 NOTE — Patient Instructions (Addendum)
   Memory Compensation Strategies  Use "WARM" strategy. W= write it down A=  associate it R=  repeat it M=  make a mental picture   We worked with this a little today in that I assisted you how to recall "[address] You told me some things that were on [address], that it intersects [place] that [place] runs a 22 degree angle, [place] is between [employer] and [place], [employer] and [employer] and [employer] are separated by [place] are repeating the name, associating it with other things, and making mental pictures as you engage with this task.

## 2021-04-29 NOTE — Therapy (Signed)
[facility] [address], [place], [name], [address] Phone: [phone]   Fax:  [phone]  Physical Therapy Treatment  Patient Details  Name: Jonathan [name] MRN: [id number] Date of Birth: 09-24-1932 Referring Provider (PT): Lazaro Arms, NP   Encounter Date: 04/29/2021   PT End of Session - 04/29/21 1712     Visit Number 3    Number of Visits 9    Date for PT Re-Evaluation 06/13/21    Authorization Type Medicare/BCBS    PT Start Time 1627   pt late from [place]   PT Stop Time 1709    PT Time Calculation (min) 42 min    Equipment Utilized During Treatment Gait belt    Activity Tolerance Patient tolerated treatment well    Behavior During Therapy [facility] for tasks assessed/performed             Past Medical History:  Diagnosis Date   Acute encephalopathy 05/08/2019   Allergic rhinitis, seasonal 12/04/2011   Arthritis of hand 11/08/2020   BPH (benign prostatic hyperplasia)    Bronchitis    Cataract    Chronic anticoagulation 12/21/2017   CHADS VASC=4, Eliquis stopped Sept 2021- recurrent falls with SDH   Chronic atrial fibrillation    On Eliquis   Chronic pain, legs and back 03/10/2017   Preferred pain management Leg and back pain   Compression fracture of thoracic vertebra 09/21/2017   COPD (chronic obstructive pulmonary disease)    2 liters O2 HS   Degeneration of thoracic intervertebral disc 09/21/2017   Degenerative joint disease of hand 07/14/2017   Dupuytren's disease of palm 07/12/2019   Essential hypertension 12/04/2011   Fatty tumor    waste and back   Fibromyalgia    Generalized anxiety disorder 06/12/2020   12/21 He is reporting severe insomnia and anxiety symptoms.  Options are limited.  We will try a low-dose lorazepam at night and during the day for anxiety and panic attacks.  He will stop lorazepam if problems.  He will stop drinking alcohol.  Discontinue BuSpar.  Reduce Cymbalta to 1 a day.   GERD  (gastroesophageal reflux disease)    Glaucoma 03/08/2016   Gout 03/08/2016   Hereditary and idiopathic peripheral neuropathy 08/28/2015   History of COVID-19 05/08/2019   2021 Post-COVID sx's -" brain fog" Try Lion's mane supplement and B complex with niacin for neuropathy   Hyperlipidemia 03/10/2017   Hypothyroidism    Hypoxia    Insomnia    Intracranial hemorrhage    Leukocytosis 03/14/2020   Major depressive disorder 09/07/2016   Obstructive sleep apnea    Osteoarthritis    Osteoporosis    Recurrent falls 03/25/2020   PT. Treat neuropathy, insomnia.  Reduce Cymbalta to 1 a day.  Discontinue BuSpar.   Rotator cuff arthropathy of left shoulder 07/21/2019   SDH (subdural hematoma) 03/21/2020   Recurrent SDH secondary to falls at home- Sept 2019 and again 03/14/2020- Eliquis stopped.   Sebaceous cyst 06/27/2019   Spinal compression fracture seventh vertebre   Spondylolisthesis 09/07/2016   On fosamax - for about one year - Dr Alyson Ingles  10/14/17 dexa: normal dexa -- spine 2.1,   RFN -0.9,  LFN   -0.7   - no comparison on file, previous fracture      Thoracic back pain 08/10/2017   Thrombocytopenia 12/20/2020   TIA (transient ischemic attack) 08/28/2015   Type 2 diabetes mellitus 03/08/2016    Past Surgical History:  Procedure Laterality  Date   APPENDECTOMY  age 10   CARPAL TUNNEL RELEASE Right    early 2000s   CATARACT EXTRACTION     bilateral   CHOLECYSTECTOMY  age 63   EYE SURGERY     FOOT ARTHRODESIS Right 02/02/2013   Procedure: RIGHT HALLUX METATARSAL PHALANGEAL JOINT ARTHRODESIS ;  Surgeon: Wylene Simmer, MD;  Location: [place];  Service: Orthopedics;  Laterality: Right;   INGUINAL HERNIA REPAIR  age 60   rt side   NASAL CONCHA BULLOSA RESECTION  age 89   PROSTATE SURGERY     SHOULDER ARTHROSCOPY W/ ROTATOR CUFF REPAIR Right    early 2000s   tonsil     VASECTOMY  age 59    There were no vitals filed for this visit.   Subjective Assessment -  04/29/21 1550     Subjective Has been following his walking program- walked inside today d/t the weather.    Pertinent History PMH: pAF on Eliquis (to see cardiologist on 04/23/21), chronic pain on oxycodone and Ambien, previous SDH, COPD on home O2 noct, HTN, hypothyroidism, OSA on CPAP, and DM    Patient Stated Goals Pt's goal for therapy is to improve balance, strengthening, and walking.    Currently in Pain? No/denies                               [facility] Adult PT Treatment/Exercise - 04/29/21 0001       Neuro Re-ed    Neuro Re-ed Details  romberg with EC 2x30", romberg + horizontal and vertical VOR 10x; stepping over cone 10x each with 2 finger support on II bars; fwd/back step over 1/2 foam roll 10x each   in II bars     Knee/Hip Exercises: Aerobic   Nustep L4 x 6 min (UEs/LEs)      Knee/Hip Exercises: Seated   Sit to Sand 10 reps;without UE support;2 sets   10x with green medball, 10x with red TB                    PT Education - 04/29/21 1711     Education Details update to HEP-  Access Code: [email or web address]    Person(s) Educated Patient    Methods Explanation;Demonstration;Tactile cues;Verbal cues;Handout    Comprehension Verbalized understanding;Returned demonstration              PT Short Term Goals - 04/29/21 1715       PT SHORT TERM GOAL #1   Title Pt will be independent with HEP for improved strength, balance, transfers, and gait.  TARGET 05/16/2021    Time 4    Period Weeks    Status On-going      PT SHORT TERM GOAL #2   Title Pt will improve 5x sit<>stand to less than or equal to 11.5 sec to demonstrate improved functional strength and transfer efficiency.    Baseline 12.38 sec with decreased forward lean to initiate    Time 4    Period Weeks    Status On-going      PT SHORT TERM GOAL #3   Title 6MWT to be assessed and goal written as appropriate.    Time 4    Period Weeks    Status On-going               PT Long  Term Goals - 04/29/21 1715       PT LONG TERM  GOAL #1   Title Pt will be independent with HEP for improved strength, balance, transfers, and gait.  TARGET 06/13/2021    Time 8    Period Weeks    Status On-going      PT LONG TERM GOAL #2   Title Pt will improve DGI score to at least 19/24 to decrease fall risk.    Baseline 15/24    Time 8    Status On-going      PT LONG TERM GOAL #3   Title Patient will demo ability to complete situation 4 of M-CTSIB for >/= 15 seconds to improve balance.    Time 8    Period Weeks    Status On-going      PT LONG TERM GOAL #4   Title Pt will verbalize plans for continued community fitness upon d/c from PT to maximize gains made in therapy.    Time 8    Period Weeks    Status On-going      PT LONG TERM GOAL #5   Title Patient to complete 476m on 6 minute walk test with LRAD.    Time 8    Period Weeks    Status On-going                   Plan - 04/29/21 1712     Clinical Impression Statement Patient arrived to session with report of following his walking program without complaints. Continued working on STS transfers with added challenge. Patient required minor cueing for proper set up with transfers but with good carryover and control. Worked on static and dynamic standing balance activities with narrow BOS, which patient demonstrated moderate sway and reaching strategy with. Worked on stepping strategy with patient requiring B 2 finger support on parallel bars d/t imbalance. Stability did improve after multiple reps. Patient reported understanding of HEP update and without complaints at end of session.    Comorbidities PMH: pAF on Eliquis, chronic pain on oxycodone and Ambien, previous SDH, COPD on home O2 noct, HTN, hypothyroidism, OSA on CPAP, and DM, peripheral neuropathy    PT Treatment/Interventions ADLs/Self Care Home Management;Gait training;Functional mobility training;Therapeutic activities;Therapeutic exercise;Balance  training;Neuromuscular re-education;Patient/family education    PT Next Visit Plan reassess HEP for  balance, especially vestibular training for balance, sit<>stand with increased forward lean    Consulted and Agree with Plan of Care Patient             Patient will benefit from skilled therapeutic intervention in order to improve the following deficits and impairments:  Abnormal gait, Difficulty walking, Decreased balance, Decreased mobility, Decreased strength  Visit Diagnosis: Unsteadiness on feet  Muscle weakness (generalized)  Other abnormalities of gait and mobility     Problem List Patient Active Problem List   Diagnosis Date Noted   Aortic atherosclerosis ([place]) 02/04/2021   Mild cognitive impairment 01/04/2021   Fatty liver 12/29/2020   Thrombocytopenia 12/20/2020   Urinary frequency 12/20/2020   Arthritis of hand 11/08/2020   Generalized anxiety disorder 06/12/2020   Recurrent falls 03/25/2020   SDH (subdural hematoma) 03/21/2020   Leukocytosis 03/14/2020   Intracranial hemorrhage    Rotator cuff arthropathy of left shoulder 07/21/2019   Dupuytren's disease of palm 07/12/2019   Sebaceous cyst 06/27/2019   History of COVID-19 05/08/2019   Obstructive sleep apnea    Asymmetrical left sensorineural hearing loss 07/12/2018   Chronic anticoagulation 12/21/2017   Degeneration of thoracic intervertebral disc 09/21/2017   Thoracic back pain 08/10/2017  Degenerative joint disease of hand 07/14/2017   Osteoarthritis    Hypoxia    Fibromyalgia    Fatty tumor    BPH (benign prostatic hyperplasia)    Chronic atrial fibrillation    Insomnia 03/10/2017   Chronic pain, legs and back 03/10/2017   Hyperlipidemia 03/10/2017   Obesity (BMI 30.0-34.9) 12/08/2016   Major depressive disorder 09/07/2016   Spondylolisthesis 09/07/2016   Hypothyroidism 03/08/2016   GERD (gastroesophageal reflux disease) 03/08/2016   Gout 03/08/2016   Glaucoma 03/08/2016   Type 2 diabetes  mellitus 03/08/2016   Hereditary and idiopathic peripheral neuropathy 08/28/2015   TIA (transient ischemic attack) 08/28/2015   Essential hypertension 12/04/2011   Allergic rhinitis, seasonal 12/04/2011   COPD (chronic obstructive pulmonary disease)  07/18/2011    Janene Harvey, PT, DPT 04/29/21 5:18 PM   [facility] [address], [place], [name], [address] Phone: [phone]   Fax:  [phone]  Name: Jt Brabec MRN: [id number] Date of Birth: 1932-09-21

## 2021-04-29 NOTE — Therapy (Signed)
[facility] [address] [place], [name], [address] Phone: [phone]   Fax:  [phone]  Speech Language Pathology Treatment  Patient Details  Name: Jonathan [name] MRN: [id number] Date of Birth: Oct 05, 1932 Referring Provider (SLP): Lazaro Arms, NP   Encounter Date: 04/29/2021   End of Session - 04/29/21 1702     Visit Number 2    Number of Visits 3    Date for SLP Re-Evaluation 05/16/21    SLP Start Time 68    SLP Stop Time  1622    SLP Time Calculation (min) 42 min    Activity Tolerance Patient tolerated treatment well             Past Medical History:  Diagnosis Date   Acute encephalopathy 05/08/2019   Allergic rhinitis, seasonal 12/04/2011   Arthritis of hand 11/08/2020   BPH (benign prostatic hyperplasia)    Bronchitis    Cataract    Chronic anticoagulation 12/21/2017   CHADS VASC=4, Eliquis stopped Sept 2021- recurrent falls with SDH   Chronic atrial fibrillation    On Eliquis   Chronic pain, legs and back 03/10/2017   Preferred pain management Leg and back pain   Compression fracture of thoracic vertebra 09/21/2017   COPD (chronic obstructive pulmonary disease)    2 liters O2 HS   Degeneration of thoracic intervertebral disc 09/21/2017   Degenerative joint disease of hand 07/14/2017   Dupuytren's disease of palm 07/12/2019   Essential hypertension 12/04/2011   Fatty tumor    waste and back   Fibromyalgia    Generalized anxiety disorder 06/12/2020   12/21 He is reporting severe insomnia and anxiety symptoms.  Options are limited.  We will try a low-dose lorazepam at night and during the day for anxiety and panic attacks.  He will stop lorazepam if problems.  He will stop drinking alcohol.  Discontinue BuSpar.  Reduce Cymbalta to 1 a day.   GERD (gastroesophageal reflux disease)    Glaucoma 03/08/2016   Gout 03/08/2016   Hereditary and idiopathic peripheral neuropathy 08/28/2015   History of  COVID-19 05/08/2019   2021 Post-COVID sx's -" brain fog" Try Lion's mane supplement and B complex with niacin for neuropathy   Hyperlipidemia 03/10/2017   Hypothyroidism    Hypoxia    Insomnia    Intracranial hemorrhage    Leukocytosis 03/14/2020   Major depressive disorder 09/07/2016   Obstructive sleep apnea    Osteoarthritis    Osteoporosis    Recurrent falls 03/25/2020   PT. Treat neuropathy, insomnia.  Reduce Cymbalta to 1 a day.  Discontinue BuSpar.   Rotator cuff arthropathy of left shoulder 07/21/2019   SDH (subdural hematoma) 03/21/2020   Recurrent SDH secondary to falls at home- Sept 2019 and again 03/14/2020- Eliquis stopped.   Sebaceous cyst 06/27/2019   Spinal compression fracture seventh vertebre   Spondylolisthesis 09/07/2016   On fosamax - for about one year - Dr Alyson Ingles  10/14/17 dexa: normal dexa -- spine 2.1,   RFN -0.9,  LFN   -0.7   - no comparison on file, previous fracture      Thoracic back pain 08/10/2017   Thrombocytopenia 12/20/2020   TIA (transient ischemic attack) 08/28/2015   Type 2 diabetes mellitus 03/08/2016    Past Surgical History:  Procedure Laterality Date   APPENDECTOMY  age 17   [place] Right    early 2000s   CATARACT EXTRACTION     bilateral  CHOLECYSTECTOMY  age 22   EYE SURGERY     FOOT ARTHRODESIS Right 02/02/2013   Procedure: RIGHT HALLUX METATARSAL PHALANGEAL JOINT ARTHRODESIS ;  Surgeon: Wylene Simmer, MD;  Location: [facility];  Service: Orthopedics;  Laterality: Right;   INGUINAL HERNIA REPAIR  age 89   rt side   NASAL CONCHA BULLOSA RESECTION  age 76   PROSTATE SURGERY     SHOULDER ARTHROSCOPY W/ ROTATOR CUFF REPAIR Right    early 2000s   tonsil     VASECTOMY  age 89    There were no vitals filed for this visit.   Subjective Assessment - 04/29/21 1634     Subjective "I still can't remember the name for that street I drive on to get here."    Currently in Pain? No/denies                    ADULT SLP TREATMENT - 04/29/21 1635       General Information   Behavior/Cognition Alert;Cooperative;Pleasant mood      Treatment Provided   Treatment provided Cognitive-Linquistic      Cognitive-Linquistic Treatment   Treatment focused on Cognition    Skilled Treatment SLP took pt's suggestion and used memory strategies with him to assist how to better recall "[address] (see pt instructions). Then reviewed semantic feature analysis (SFA) with pt with "sanitizer" and "spartan" - pt with rare min A for procedure to progress through SFA. Pt had phone number he wanted to recall - SLP talked about chunking and visualization to assist with this.      Assessment / Recommendations / Plan   Plan Continue with current plan of care   introduce verb treatment next session     Progression Toward Goals   Progression toward goals Progressing toward goals              SLP Education - 04/29/21 1659     Education Details memory compensations, semantic feature analysis    Person(s) Educated Patient    Methods Explanation;Demonstration    Comprehension Verbalized understanding                SLP Long Term Goals - 04/29/21 1704       SLP LONG TERM GOAL #1   Title pt will demo independence with both semantic feature analysis (SFA) and verb network strengthening treatment (VNeST)    Time 1    Period --   sessions   Status On-going      SLP LONG TERM GOAL #2   Title pt will report that he feels more confident than prior to ST with his conversational ability    Time 1    Period --   sessions   Status On-going              Plan - 04/29/21 1703     Clinical Impression Statement Pt complains of symptoms of word finding in conversation. Due to BNT-2 scores WNL, but pt's frustration with his mild anomia in conversation SLP will teach pt two language networking techniques in hopes this will assist him in his conversation. Pt agrees with this plan - likely d/c in  next 1-2 visits    Speech Therapy Frequency --   every other week   Duration --   x2 vists   Treatment/Interventions SLP instruction and feedback;Patient/family education;Compensatory techniques;Language facilitation;Multimodal communcation approach    Potential to Achieve Goals Good    SLP Home Exercise Plan semantic feature  analysis    Consulted and Agree with Plan of Care Patient             Patient will benefit from skilled therapeutic intervention in order to improve the following deficits and impairments:   Aphasia  Cognitive communication deficit    Problem List Patient Active Problem List   Diagnosis Date Noted   Aortic atherosclerosis ([place]) 02/04/2021   Mild cognitive impairment 01/04/2021   Fatty liver 12/29/2020   Thrombocytopenia 12/20/2020   Urinary frequency 12/20/2020   Arthritis of hand 11/08/2020   Generalized anxiety disorder 06/12/2020   Recurrent falls 03/25/2020   SDH (subdural hematoma) 03/21/2020   Leukocytosis 03/14/2020   Intracranial hemorrhage    Rotator cuff arthropathy of left shoulder 07/21/2019   Dupuytren's disease of palm 07/12/2019   Sebaceous cyst 06/27/2019   History of COVID-19 05/08/2019   Obstructive sleep apnea    Asymmetrical left sensorineural hearing loss 07/12/2018   Chronic anticoagulation 12/21/2017   Degeneration of thoracic intervertebral disc 09/21/2017   Thoracic back pain 08/10/2017   Degenerative joint disease of hand 07/14/2017   Osteoarthritis    Hypoxia    Fibromyalgia    Fatty tumor    BPH (benign prostatic hyperplasia)    Chronic atrial fibrillation    Insomnia 03/10/2017   Chronic pain, legs and back 03/10/2017   Hyperlipidemia 03/10/2017   Obesity (BMI 30.0-34.9) 12/08/2016   Major depressive disorder 09/07/2016   Spondylolisthesis 09/07/2016   Hypothyroidism 03/08/2016   GERD (gastroesophageal reflux disease) 03/08/2016   Gout 03/08/2016   Glaucoma 03/08/2016   Type 2 diabetes mellitus 03/08/2016    Hereditary and idiopathic peripheral neuropathy 08/28/2015   TIA (transient ischemic attack) 08/28/2015   Essential hypertension 12/04/2011   Allergic rhinitis, seasonal 12/04/2011   COPD (chronic obstructive pulmonary disease)  07/18/2011    [facility] ,MS, CCC-SLP  04/29/2021, 5:05 PM  [facility] [address], [place], [name], [address] Phone: [phone]   Fax:  [phone]   Name: Jonathan [name] MRN: [id number] Date of Birth: [birth year]

## 2021-04-30 ENCOUNTER — Encounter: Payer: Medicare Other | Admitting: Psychology

## 2021-05-05 ENCOUNTER — Encounter: Payer: Self-pay | Admitting: Physical Therapy

## 2021-05-05 ENCOUNTER — Other Ambulatory Visit: Payer: Self-pay

## 2021-05-05 ENCOUNTER — Ambulatory Visit: Payer: Medicare Other | Admitting: Physical Therapy

## 2021-05-05 DIAGNOSIS — R2681 Unsteadiness on feet: Secondary | ICD-10-CM

## 2021-05-05 DIAGNOSIS — M6281 Muscle weakness (generalized): Secondary | ICD-10-CM | POA: Diagnosis not present

## 2021-05-05 DIAGNOSIS — R4701 Aphasia: Secondary | ICD-10-CM | POA: Diagnosis not present

## 2021-05-05 DIAGNOSIS — R2689 Other abnormalities of gait and mobility: Secondary | ICD-10-CM | POA: Diagnosis not present

## 2021-05-05 DIAGNOSIS — R41841 Cognitive communication deficit: Secondary | ICD-10-CM | POA: Diagnosis not present

## 2021-05-05 NOTE — Therapy (Signed)
[facility] [address], [place], [name], [address] Phone: [phone]   Fax:  [phone]  Physical Therapy Treatment  Patient Details  Name: Jonathan [name] MRN: [id number] Date of Birth: 06/19/1932 Referring Provider (PT): Lazaro Arms, NP   Encounter Date: 05/05/2021   PT End of Session - 05/05/21 1319     Visit Number 4    Number of Visits 9    Date for PT Re-Evaluation 06/13/21    Authorization Type Medicare/BCBS    PT Start Time 1233    PT Stop Time 1314    PT Time Calculation (min) 41 min    Activity Tolerance Patient tolerated treatment well    Behavior During Therapy [facility] for tasks assessed/performed             Past Medical History:  Diagnosis Date   Acute encephalopathy 05/08/2019   Allergic rhinitis, seasonal 12/04/2011   Arthritis of hand 11/08/2020   BPH (benign prostatic hyperplasia)    Bronchitis    Cataract    Chronic anticoagulation 12/21/2017   CHADS VASC=4, Eliquis stopped Sept 2021- recurrent falls with SDH   Chronic atrial fibrillation    On Eliquis   Chronic pain, legs and back 03/10/2017   Preferred pain management Leg and back pain   Compression fracture of thoracic vertebra 09/21/2017   COPD (chronic obstructive pulmonary disease)    2 liters O2 HS   Degeneration of thoracic intervertebral disc 09/21/2017   Degenerative joint disease of hand 07/14/2017   Dupuytren's disease of palm 07/12/2019   Essential hypertension 12/04/2011   Fatty tumor    waste and back   Fibromyalgia    Generalized anxiety disorder 06/12/2020   12/21 He is reporting severe insomnia and anxiety symptoms.  Options are limited.  We will try a low-dose lorazepam at night and during the day for anxiety and panic attacks.  He will stop lorazepam if problems.  He will stop drinking alcohol.  Discontinue BuSpar.  Reduce Cymbalta to 1 a day.   GERD (gastroesophageal reflux disease)    Glaucoma 03/08/2016   Gout  03/08/2016   Hereditary and idiopathic peripheral neuropathy 08/28/2015   History of COVID-19 05/08/2019   2021 Post-COVID sx's -" brain fog" Try Lion's mane supplement and B complex with niacin for neuropathy   Hyperlipidemia 03/10/2017   Hypothyroidism    Hypoxia    Insomnia    Intracranial hemorrhage    Leukocytosis 03/14/2020   Major depressive disorder 09/07/2016   Obstructive sleep apnea    Osteoarthritis    Osteoporosis    Recurrent falls 03/25/2020   PT. Treat neuropathy, insomnia.  Reduce Cymbalta to 1 a day.  Discontinue BuSpar.   Rotator cuff arthropathy of left shoulder 07/21/2019   SDH (subdural hematoma) 03/21/2020   Recurrent SDH secondary to falls at home- Sept 2019 and again 03/14/2020- Eliquis stopped.   Sebaceous cyst 06/27/2019   Spinal compression fracture seventh vertebre   Spondylolisthesis 09/07/2016   On fosamax - for about one year - Dr Alyson Ingles  10/14/17 dexa: normal dexa -- spine 2.1,   RFN -0.9,  LFN   -0.7   - no comparison on file, previous fracture      Thoracic back pain 08/10/2017   Thrombocytopenia 12/20/2020   TIA (transient ischemic attack) 08/28/2015   Type 2 diabetes mellitus 03/08/2016    Past Surgical History:  Procedure Laterality Date   APPENDECTOMY  age 89   CARPAL TUNNEL RELEASE Right  early 2000s   CATARACT EXTRACTION     bilateral   CHOLECYSTECTOMY  age 40   EYE SURGERY     FOOT ARTHRODESIS Right 02/02/2013   Procedure: RIGHT HALLUX METATARSAL PHALANGEAL JOINT ARTHRODESIS ;  Surgeon: Wylene Simmer, MD;  Location: [facility];  Service: Orthopedics;  Laterality: Right;   INGUINAL HERNIA REPAIR  age 38   rt side   NASAL CONCHA BULLOSA RESECTION  age 11   PROSTATE SURGERY     SHOULDER ARTHROSCOPY W/ ROTATOR CUFF REPAIR Right    early 2000s   tonsil     VASECTOMY  age 74    There were no vitals filed for this visit.   Subjective Assessment - 05/05/21 1235     Subjective Been doing the 6 minute walk (and a  little more) while my wife has been doing therapy at [facility].  Pt reports during PT session today he has been told by cardiologist to keep HR <100 bpm.  He is to see cardiologist tomorrow.    Pertinent History PMH: pAF on Eliquis (to see cardiologist on 04/23/21), chronic pain on oxycodone and Ambien, previous SDH, COPD on home O2 noct, HTN, hypothyroidism, OSA on CPAP, and DM    Patient Stated Goals Pt's goal for therapy is to improve balance, strengthening, and walking.    Currently in Pain? No/denies                [facility] PT Assessment - 05/05/21 0001       Precautions   Precautions Fall;Other (comment)    Precaution Comments Per patient report 05/05/21:  Cardiologist has told pt to avoid HR >100 with activity                Access Code: [email or web address] URL: [email or web address] Date: 05/05/2021 Prepared by: Mady Haagensen  Program Notes  perform standing exercises at counter top for safety!  Exercises-pt return demo understanding  Sit to Stand with Arms Crossed - 1 x daily - 5 x weekly - 2 sets - 10 reps Romberg Stance with Eyes Closed - 1 x daily - 5 x weekly - 2 sets - 30 sec hold Romberg Stance with Head Nods - 1 x daily - 5 x weekly - 2 sets - 10 reps Romberg Stance with Head Rotation - 1 x daily - 5 x weekly - 2 sets - 10 reps Forward and Backward Step Over with Beam - 1 x daily - 5 x weekly - 2 sets - 10 reps Forward step and weightshift over obstacle          [facility] Adult PT Treatment/Exercise - 05/05/21 0001       Neuro Re-ed    Neuro Re-ed Details  Side step over obstacle, then return to start position, 10 reps each leg, with cues for increased step length and foot clearance.      Knee/Hip Exercises: Aerobic   Nustep Level 3, BLES only x 5 minutes for leg strength, flexibility.      Knee/Hip Exercises: Seated   Sit to Sand 10 reps;without UE support;2 sets   Holding green weighted ball on solid surface, then standing on Airex                 Balance Exercises - 05/05/21 0001       Balance Exercises: Standing   Tandem Stance Eyes open;Upper extremity support 1;3 reps;15 secs   cues for glut activation for posture   Tandem Gait Forward;Intermittent upper extremity support;3  reps;Retro    Sidestepping Upper extremity support;Theraband;Limitations;4 reps    Theraband Level (Sidestepping) Level 2 (Red)    Sidestepping Limitations Progressed to 2 reps with red theraband   HR with activity is 95 bpm   Marching Solid surface;Upper extremity assist 1;Dynamic;Forwards;Retro;Limitations    Marching Limitations With attempts at tandem march, pt loses balance with narrow BOS, needing UE support                PT Education - 05/05/21 1319     Education Details Additions to HEP and updates    Person(s) Educated Patient    Methods Explanation;Demonstration;Verbal cues;Handout    Comprehension Returned demonstration;Verbalized understanding;Verbal cues required              PT Short Term Goals - 04/29/21 1715       PT SHORT TERM GOAL #1   Title Pt will be independent with HEP for improved strength, balance, transfers, and gait.  TARGET 05/16/2021    Time 4    Period Weeks    Status On-going      PT SHORT TERM GOAL #2   Title Pt will improve 5x sit<>stand to less than or equal to 11.5 sec to demonstrate improved functional strength and transfer efficiency.    Baseline 12.38 sec with decreased forward lean to initiate    Time 4    Period Weeks    Status On-going      PT SHORT TERM GOAL #3   Title 6MWT to be assessed and goal written as appropriate.    Time 4    Period Weeks    Status On-going               PT Long Term Goals - 04/29/21 1715       PT LONG TERM GOAL #1   Title Pt will be independent with HEP for improved strength, balance, transfers, and gait.  TARGET 06/13/2021    Time 8    Period Weeks    Status On-going      PT LONG TERM GOAL #2   Title Pt will improve DGI score to  at least 19/24 to decrease fall risk.    Baseline 15/24    Time 8    Status On-going      PT LONG TERM GOAL #3   Title Patient will demo ability to complete situation 4 of M-CTSIB for >/= 15 seconds to improve balance.    Time 8    Period Weeks    Status On-going      PT LONG TERM GOAL #4   Title Pt will verbalize plans for continued community fitness upon d/c from PT to maximize gains made in therapy.    Time 8    Period Weeks    Status On-going      PT LONG TERM GOAL #5   Title Patient to complete 465m on 6 minute walk test with LRAD.    Time 8    Period Weeks    Status On-going                   Plan - 05/05/21 1320     Clinical Impression Statement Continued work on narrowed BOS and single limb stance, with cues for glut activation, use of visual target to improve stability in these positions.  Pt continues to have increased postural sway with dynamic narrow BOS/tandem gait activities.  Added stationary tandem stance to HEP.    Comorbidities PMH: pAF on Eliquis,  chronic pain on oxycodone and Ambien, previous SDH, COPD on home O2 noct, HTN, hypothyroidism, OSA on CPAP, and DM, peripheral neuropathy    PT Frequency 1x / week    PT Duration 8 weeks    PT Treatment/Interventions ADLs/Self Care Home Management;Gait training;Functional mobility training;Therapeutic activities;Therapeutic exercise;Balance training;Neuromuscular re-education;Patient/family education    PT Next Visit Plan Continue to reassess HEP for  balance, especially vestibular training for balance, sit<>stand with increased forward lean    Consulted and Agree with Plan of Care Patient             Patient will benefit from skilled therapeutic intervention in order to improve the following deficits and impairments:  Abnormal gait, Difficulty walking, Decreased balance, Decreased mobility, Decreased strength  Visit Diagnosis: Unsteadiness on feet  Muscle weakness (generalized)     Problem  List Patient Active Problem List   Diagnosis Date Noted   Aortic atherosclerosis ([place]) 02/04/2021   Mild cognitive impairment 01/04/2021   Fatty liver 12/29/2020   Thrombocytopenia 12/20/2020   Urinary frequency 12/20/2020   Arthritis of hand 11/08/2020   Generalized anxiety disorder 06/12/2020   Recurrent falls 03/25/2020   SDH (subdural hematoma) 03/21/2020   Leukocytosis 03/14/2020   Intracranial hemorrhage    Rotator cuff arthropathy of left shoulder 07/21/2019   Dupuytren's disease of palm 07/12/2019   Sebaceous cyst 06/27/2019   History of COVID-19 05/08/2019   Obstructive sleep apnea    Asymmetrical left sensorineural hearing loss 07/12/2018   Chronic anticoagulation 12/21/2017   Degeneration of thoracic intervertebral disc 09/21/2017   Thoracic back pain 08/10/2017   Degenerative joint disease of hand 07/14/2017   Osteoarthritis    Hypoxia    Fibromyalgia    Fatty tumor    BPH (benign prostatic hyperplasia)    Chronic atrial fibrillation    Insomnia 03/10/2017   Chronic pain, legs and back 03/10/2017   Hyperlipidemia 03/10/2017   Obesity (BMI 30.0-34.9) 12/08/2016   Major depressive disorder 09/07/2016   Spondylolisthesis 09/07/2016   Hypothyroidism 03/08/2016   GERD (gastroesophageal reflux disease) 03/08/2016   Gout 03/08/2016   Glaucoma 03/08/2016   Type 2 diabetes mellitus 03/08/2016   Hereditary and idiopathic peripheral neuropathy 08/28/2015   TIA (transient ischemic attack) 08/28/2015   Essential hypertension 12/04/2011   Allergic rhinitis, seasonal 12/04/2011   COPD (chronic obstructive pulmonary disease)  07/18/2011    Kerrie Latour W., PT 05/05/2021, 3:25 PM  [facility] [address] [place], [name], [address] Phone: [phone]   Fax:  [phone]  Name: Jonathan [name] MRN: [id number] Date of Birth: 04/20/1933

## 2021-05-05 NOTE — Patient Instructions (Addendum)
Access Code: [email or web address] URL: [email or web address] Date: 05/05/2021 Prepared by: Mady Haagensen  Program Notes perform standing exercises at counter top for safety!   Exercises Sit to Stand with Arms Crossed - 1 x daily - 5 x weekly - 2 sets - 10 reps Romberg Stance with Eyes Closed - 1 x daily - 5 x weekly - 2 sets - 30 sec hold Romberg Stance with Head Nods - 1 x daily - 5 x weekly - 2 sets - 10 reps Romberg Stance with Head Rotation - 1 x daily - 5 x weekly - 2 sets - 10 reps  Added to HEP Forward and Backward Step Over with Beam (BKA) - 1 x daily - 5 x weekly - 2 sets - 10 reps (modified instructions) Standing Tandem Balance with Counter Support - 1 x daily - 5 x weekly - 1 sets - 3 reps - 30 sec hold

## 2021-05-07 ENCOUNTER — Other Ambulatory Visit: Payer: Self-pay

## 2021-05-07 ENCOUNTER — Ambulatory Visit (HOSPITAL_COMMUNITY): Payer: Medicare Other | Attending: Cardiology

## 2021-05-07 DIAGNOSIS — I480 Paroxysmal atrial fibrillation: Secondary | ICD-10-CM | POA: Insufficient documentation

## 2021-05-07 LAB — ECHOCARDIOGRAM COMPLETE
Area-P 1/2: 3.65 cm2
S' Lateral: 2.8 cm

## 2021-05-13 ENCOUNTER — Encounter: Payer: Self-pay | Admitting: Physical Therapy

## 2021-05-13 ENCOUNTER — Ambulatory Visit: Payer: Medicare Other | Admitting: Physical Therapy

## 2021-05-13 ENCOUNTER — Other Ambulatory Visit: Payer: Self-pay

## 2021-05-13 ENCOUNTER — Ambulatory Visit: Payer: Medicare Other

## 2021-05-13 DIAGNOSIS — R2689 Other abnormalities of gait and mobility: Secondary | ICD-10-CM | POA: Diagnosis not present

## 2021-05-13 DIAGNOSIS — R4701 Aphasia: Secondary | ICD-10-CM

## 2021-05-13 DIAGNOSIS — M6281 Muscle weakness (generalized): Secondary | ICD-10-CM | POA: Diagnosis not present

## 2021-05-13 DIAGNOSIS — R41841 Cognitive communication deficit: Secondary | ICD-10-CM

## 2021-05-13 DIAGNOSIS — R2681 Unsteadiness on feet: Secondary | ICD-10-CM

## 2021-05-13 NOTE — Patient Instructions (Signed)
Divided Attention ideas - any can be done together  Playing a card game either by yourself or with someone  Playing a board game with someone  Sorting playing cards  Writing down commercials on TV or the radio  Writing down news stories on TV or radio  Working outside with yard work, gardening, Social research officer, government.  [employer]  Recite state names and capitals  Complete long division or 2 or 3 digit multiplication problems  Listen to a Psychologist, sport and exercise (i.e., YouTube), or online news ([place], CNN, etc.), or to a family member give a story, and give pertinent information from the speech, news story, or conversation or story after it is over  Empty the dishwasher  Complete paper/pencil puzzles (sudoku, crosswords, word search)  Complete puzzles online (www.lumosity.com -computer/tablet based, or www.constanttherapy.com - tablet based)  Therapy worksheets/exercises  Put together a puzzle  State coins needed to make a certain change amount  Have someone read a portion of a novel to you, and you give a summary  Call out the total change for combinations of coins   Start at a level that you are challenged by, but also where you can see some success

## 2021-05-13 NOTE — Therapy (Signed)
[facility] [place] [address], [place], [name], [address] Phone: [phone]   Fax:  [phone]  Speech Language Pathology Treatment/Renewal Summary  Patient Details  Name: Jonathan [name] MRN: [id number] Date of Birth: Mar 24, 1933 Referring Provider (SLP): Lazaro Arms, NP   Encounter Date: 05/13/2021   End of Session - 05/13/21 1614     Visit Number 3    Number of Visits 9    Date for SLP Re-Evaluation 06/27/21    SLP Start Time 3570    SLP Stop Time  1779    SLP Time Calculation (min) 42 min    Activity Tolerance Patient tolerated treatment well             Past Medical History:  Diagnosis Date   Acute encephalopathy 05/08/2019   Allergic rhinitis, seasonal 12/04/2011   Arthritis of hand 11/08/2020   BPH (benign prostatic hyperplasia)    Bronchitis    Cataract    Chronic anticoagulation 12/21/2017   CHADS VASC=4, Eliquis stopped Sept 2021- recurrent falls with SDH   Chronic atrial fibrillation    On Eliquis   Chronic pain, legs and back 03/10/2017   Preferred pain management Leg and back pain   Compression fracture of thoracic vertebra 09/21/2017   COPD (chronic obstructive pulmonary disease)    2 liters O2 HS   Degeneration of thoracic intervertebral disc 09/21/2017   Degenerative joint disease of hand 07/14/2017   Dupuytren's disease of palm 07/12/2019   Essential hypertension 12/04/2011   Fatty tumor    waste and back   Fibromyalgia    Generalized anxiety disorder 06/12/2020   12/21 He is reporting severe insomnia and anxiety symptoms.  Options are limited.  We will try a low-dose lorazepam at night and during the day for anxiety and panic attacks.  He will stop lorazepam if problems.  He will stop drinking alcohol.  Discontinue BuSpar.  Reduce Cymbalta to 1 a day.   GERD (gastroesophageal reflux disease)    Glaucoma 03/08/2016   Gout 03/08/2016   Hereditary and idiopathic peripheral neuropathy 08/28/2015    History of COVID-19 05/08/2019   2021 Post-COVID sx's -" brain fog" Try Lion's mane supplement and B complex with niacin for neuropathy   Hyperlipidemia 03/10/2017   Hypothyroidism    Hypoxia    Insomnia    Intracranial hemorrhage    Leukocytosis 03/14/2020   Major depressive disorder 09/07/2016   Obstructive sleep apnea    Osteoarthritis    Osteoporosis    Recurrent falls 03/25/2020   PT. Treat neuropathy, insomnia.  Reduce Cymbalta to 1 a day.  Discontinue BuSpar.   Rotator cuff arthropathy of left shoulder 07/21/2019   SDH (subdural hematoma) 03/21/2020   Recurrent SDH secondary to falls at home- Sept 2019 and again 03/14/2020- Eliquis stopped.   Sebaceous cyst 06/27/2019   Spinal compression fracture seventh vertebre   Spondylolisthesis 09/07/2016   On fosamax - for about one year - Dr Alyson Ingles  10/14/17 dexa: normal dexa -- spine 2.1,   RFN -0.9,  LFN   -0.7   - no comparison on file, previous fracture      Thoracic back pain 08/10/2017   Thrombocytopenia 12/20/2020   TIA (transient ischemic attack) 08/28/2015   Type 2 diabetes mellitus 03/08/2016    Past Surgical History:  Procedure Laterality Date   APPENDECTOMY  age 75   [place] Right    early 2000s   CATARACT EXTRACTION     bilateral  CHOLECYSTECTOMY  age 76   EYE SURGERY     FOOT ARTHRODESIS Right 02/02/2013   Procedure: RIGHT HALLUX METATARSAL PHALANGEAL JOINT ARTHRODESIS ;  Surgeon: Wylene Simmer, MD;  Location: [facility];  Service: Orthopedics;  Laterality: Right;   INGUINAL HERNIA REPAIR  age 58   rt side   NASAL CONCHA BULLOSA RESECTION  age 35   PROSTATE SURGERY     SHOULDER ARTHROSCOPY W/ ROTATOR CUFF REPAIR Right    early 2000s   tonsil     VASECTOMY  age 73    There were no vitals filed for this visit.   Subjective Assessment - 05/13/21 1406     Subjective "I have some trouble with my attention" (e.g., pt answers a phone call while typing an email and never returns to  the email)    Currently in Pain? No/denies                   ADULT SLP TREATMENT - 05/13/21 1407       General Information   Behavior/Cognition Alert;Cooperative;Pleasant mood      Treatment Provided   Treatment provided Cognitive-Linquistic      Cognitive-Linquistic Treatment   Treatment focused on Cognition    Skilled Treatment "I've probably had a number of concussions." Pt reports his latest was in July 2020, when he states his attention decr'd. SLP provided some home tasks for attention, explained them to pt, reviewed his neuropsych testing from 2022 and 2020 and believes pt would benefit from cognitive linguistic eval next session and then goals written as necessary. SLP educated pt that progress with cognition/attention would have likely been more substantial within ~12 months of the incident however there might be some therapy tasks pt/SLP could do to improve pt's attention. Otherwise, compensations would be taught as well. Goal added for attention compensations.      Assessment / Recommendations / Plan   Plan Goals updated;Other (Comment)   do cognitive evaluation next session     Progression Toward Goals   Progression toward goals --   pt to have cognitive linguistic eval next session               SLP Short Term Goals - 05/13/21 1627       SLP SHORT TERM GOAL #1   Title pt will undergo cognitive linguistic testing and goals added PRN    Time 1    Period Weeks    Status New    Target Date 05/23/21      SLP SHORT TERM GOAL #2   Title pt will report using 3 compensations for attention between 2 sessions    Time 2    Period Weeks    Status New    Target Date 05/30/21              SLP Long Term Goals - 05/13/21 1618       SLP LONG TERM GOAL #1   Title pt will demo independence with both semantic feature analysis (SFA) and verb network strengthening treatment (VNeST)    Time 1    Period --   sessions   Status On-going    Target Date  05/16/21      SLP LONG TERM GOAL #2   Title pt will report that he feels more confident than prior to ST with his conversational ability    Time 1    Period --   sessions   Status On-going    Target Date  05/16/21      SLP LONG TERM GOAL #3   Title pt will report using 3 compensations for attention between 2 sessions    Time 6    Period Weeks    Status New    Target Date 06/27/21      SLP LONG TERM GOAL #4   Title --    Target Date --              Plan - 05/13/21 1616     Clinical Impression Statement Pt complains of symptoms of word finding in conversation. Due to BNT-2 scores WNL, but pt's frustration with his mild anomia in conversation SLP will teach pt two language networking techniques in hopes this will assist him in his conversation. Pt agrees with this plan - likely d/c in next 1-2 visits. Today, pt mentioned for the first time he is having difficulty since a concussion in 2020. SLP to extend pt's plan of care for 6 weeks and perform cognitive linguistic testing next session.    Speech Therapy Frequency 1x /week   every other week   Duration --   x6 visits   Treatment/Interventions SLP instruction and feedback;Patient/family education;Compensatory techniques;Language facilitation;Multimodal communcation approach    Potential to Achieve Goals Good    SLP Home Exercise Plan semantic feature analysis    Consulted and Agree with Plan of Care Patient             Patient will benefit from skilled therapeutic intervention in order to improve the following deficits and impairments:   Cognitive communication deficit  Aphasia    Problem List Patient Active Problem List   Diagnosis Date Noted   Aortic atherosclerosis ([place]) 02/04/2021   Mild cognitive impairment 01/04/2021   Fatty liver 12/29/2020   Thrombocytopenia 12/20/2020   Urinary frequency 12/20/2020   Arthritis of hand 11/08/2020   Generalized anxiety disorder 06/12/2020   Recurrent falls 03/25/2020    SDH (subdural hematoma) 03/21/2020   Leukocytosis 03/14/2020   Intracranial hemorrhage    Rotator cuff arthropathy of left shoulder 07/21/2019   Dupuytren's disease of palm 07/12/2019   Sebaceous cyst 06/27/2019   History of COVID-19 05/08/2019   Obstructive sleep apnea    Asymmetrical left sensorineural hearing loss 07/12/2018   Chronic anticoagulation 12/21/2017   Degeneration of thoracic intervertebral disc 09/21/2017   Thoracic back pain 08/10/2017   Degenerative joint disease of hand 07/14/2017   Osteoarthritis    Hypoxia    Fibromyalgia    Fatty tumor    BPH (benign prostatic hyperplasia)    Chronic atrial fibrillation    Insomnia 03/10/2017   Chronic pain, legs and back 03/10/2017   Hyperlipidemia 03/10/2017   Obesity (BMI 30.0-34.9) 12/08/2016   Major depressive disorder 09/07/2016   Spondylolisthesis 09/07/2016   Hypothyroidism 03/08/2016   GERD (gastroesophageal reflux disease) 03/08/2016   Gout 03/08/2016   Glaucoma 03/08/2016   Type 2 diabetes mellitus 03/08/2016   Hereditary and idiopathic peripheral neuropathy 08/28/2015   TIA (transient ischemic attack) 08/28/2015   Essential hypertension 12/04/2011   Allergic rhinitis, seasonal 12/04/2011   COPD (chronic obstructive pulmonary disease)  07/18/2011    Garald Balding, CCC-SLP 05/13/2021, 4:29 PM  [facility] [address] [place], [name], [address] Phone: [phone]   Fax:  [phone]   Name: Jonathan [name] MRN: [id number] Date of Birth: February 23, 1933

## 2021-05-13 NOTE — Therapy (Signed)
[facility] [address] [place], [name], [address] Phone: [phone]   Fax:  [phone]  Physical Therapy Treatment  Patient Details  Name: Jonathan [name] MRN: [id number] Date of Birth: [birth year] Referring Provider (PT): Lazaro Arms, NP   Encounter Date: 05/13/2021   PT End of Session - 05/13/21 1324     Visit Number 5    Number of Visits 9    Date for PT Re-Evaluation 06/13/21    Authorization Type Medicare/BCBS    PT Start Time 3007    PT Stop Time 1401    PT Time Calculation (min) 43 min    Activity Tolerance Patient tolerated treatment well    Behavior During Therapy [facility] for tasks assessed/performed             Past Medical History:  Diagnosis Date   Acute encephalopathy 05/08/2019   Allergic rhinitis, seasonal 12/04/2011   Arthritis of hand 11/08/2020   BPH (benign prostatic hyperplasia)    Bronchitis    Cataract    Chronic anticoagulation 12/21/2017   CHADS VASC=4, Eliquis stopped Sept 2021- recurrent falls with SDH   Chronic atrial fibrillation    On Eliquis   Chronic pain, legs and back 03/10/2017   Preferred pain management Leg and back pain   Compression fracture of thoracic vertebra 09/21/2017   COPD (chronic obstructive pulmonary disease)    2 liters O2 HS   Degeneration of thoracic intervertebral disc 09/21/2017   Degenerative joint disease of hand 07/14/2017   Dupuytren's disease of palm 07/12/2019   Essential hypertension 12/04/2011   Fatty tumor    waste and back   Fibromyalgia    Generalized anxiety disorder 06/12/2020   12/21 He is reporting severe insomnia and anxiety symptoms.  Options are limited.  We will try a low-dose lorazepam at night and during the day for anxiety and panic attacks.  He will stop lorazepam if problems.  He will stop drinking alcohol.  Discontinue BuSpar.  Reduce Cymbalta to 1 a day.   GERD (gastroesophageal reflux disease)    Glaucoma 03/08/2016   Gout  03/08/2016   Hereditary and idiopathic peripheral neuropathy 08/28/2015   History of COVID-19 05/08/2019   2021 Post-COVID sx's -" brain fog" Try Lion's mane supplement and B complex with niacin for neuropathy   Hyperlipidemia 03/10/2017   Hypothyroidism    Hypoxia    Insomnia    Intracranial hemorrhage    Leukocytosis 03/14/2020   Major depressive disorder 09/07/2016   Obstructive sleep apnea    Osteoarthritis    Osteoporosis    Recurrent falls 03/25/2020   PT. Treat neuropathy, insomnia.  Reduce Cymbalta to 1 a day.  Discontinue BuSpar.   Rotator cuff arthropathy of left shoulder 07/21/2019   SDH (subdural hematoma) 03/21/2020   Recurrent SDH secondary to falls at home- Sept 2019 and again 03/14/2020- Eliquis stopped.   Sebaceous cyst 06/27/2019   Spinal compression fracture seventh vertebre   Spondylolisthesis 09/07/2016   On fosamax - for about one year - Dr Alyson Ingles  10/14/17 dexa: normal dexa -- spine 2.1,   RFN -0.9,  LFN   -0.7   - no comparison on file, previous fracture      Thoracic back pain 08/10/2017   Thrombocytopenia 12/20/2020   TIA (transient ischemic attack) 08/28/2015   Type 2 diabetes mellitus 03/08/2016    Past Surgical History:  Procedure Laterality Date   APPENDECTOMY  age 87   CARPAL TUNNEL RELEASE Right  early 2000s   CATARACT EXTRACTION     bilateral   CHOLECYSTECTOMY  age 87   EYE SURGERY     FOOT ARTHRODESIS Right 02/02/2013   Procedure: RIGHT HALLUX METATARSAL PHALANGEAL JOINT ARTHRODESIS ;  Surgeon: Wylene Simmer, MD;  Location: [facility];  Service: Orthopedics;  Laterality: Right;   INGUINAL HERNIA REPAIR  age 65   rt side   NASAL CONCHA BULLOSA RESECTION  age 87   PROSTATE SURGERY     SHOULDER ARTHROSCOPY W/ ROTATOR CUFF REPAIR Right    early 2000s   tonsil     VASECTOMY  age 87    There were no vitals filed for this visit.   Subjective Assessment - 05/13/21 1322     Subjective Been continueing to walk mostly, 10-12  minutes probably.  Not as much the exercises.  Had the echocardiogram, and I should be getting those results soon.    Pertinent History PMH: pAF on Eliquis (to see cardiologist on 04/23/21), chronic pain on oxycodone and Ambien, previous SDH, COPD on home O2 noct, HTN, hypothyroidism, OSA on CPAP, and DM    Patient Stated Goals Pt's goal for therapy is to improve balance, strengthening, and walking.    Currently in Pain? No/denies                [facility] PT Assessment - 05/13/21 0001       6 Minute Walk- Baseline   6 Minute Walk- Baseline yes    BP (mmHg) 163/79    HR (bpm) 79    02 Sat (%RA) 95 %      6 Minute walk- Post Test   6 Minute Walk Post Test yes    BP (mmHg) 164/79    HR (bpm) 55    02 Sat (%RA) 92 %   up to 96%   Modified Borg Scale for Dyspnea 3- Moderate shortness of breath or breathing difficulty    Perceived Rate of Exertion (Borg) 14-      6 minute walk test results    Endurance additional comments 1267 ft                     Neuro Re-education:  Reviewed HEP:    Access Code: [email or web address] URL: [email or web address] Date: 05/13/2021 Prepared by: [facility]  Program Notes  perform standing exercises at counter top for safety!  Exercises  Sit to Stand with Arms Crossed - 1 x daily - 5 x weekly - 2 sets - 10 reps Romberg Stance with Eyes Closed - 1 x daily - 5 x weekly - 2 sets - 30 sec hold Romberg Stance with Head Nods - 1 x daily - 5 x weekly - 2 sets - 10 reps Romberg Stance with Head Rotation - 1 x daily - 5 x weekly - 2 sets - 10 reps Forward and Backward Step Over with Beam (BKA) - 1 x daily - 5 x weekly - 2 sets - 10 reps Standing Tandem Balance with Counter Support - 1 x daily - 5 x weekly - 1 sets - 3 reps - 30 sec hold     Performed forward step over beam, alternating legs x 10 reps with cues for UE support Performed lateral step and weightshift over beam/return to midline x 10  reps each side     Balance Exercises - 05/13/21 0001       Balance Exercises: Standing   Rockerboard Anterior/posterior;Lateral;EO;Limitations  Rockerboard Limitations Hip/ankle strategy work on rockerboard, with BUE support, min guard, then balancing midline on board x 30 seconds                PT Education - 05/13/21 1406     Education Details Progress towards goals; importance of HEP (consistently performing at least 4-5x/wk) and walking program-working up to 12-15 minutes, as pt reports he is walking 10-12 min currently.    Person(s) Educated Patient    Methods Explanation    Comprehension Verbalized understanding              PT Short Term Goals - 05/13/21 1338       PT SHORT TERM GOAL #1   Title Pt will be independent with HEP for improved strength, balance, transfers, and gait.  TARGET 05/16/2021    Time 4    Period Weeks    Status Achieved      PT SHORT TERM GOAL #2   Title Pt will improve 5x sit<>stand to less than or equal to 11.5 sec to demonstrate improved functional strength and transfer efficiency.    Baseline 12.38 sec with decreased forward lean to initiate; 05/13/21:  15.47 sec, 2nd trial 12.38 sec    Time 4    Period Weeks    Status Not Met      PT SHORT TERM GOAL #3   Title 6MWT to be assessed and goal written as appropriate.    Baseline initial 983 ft, 05/13/21 1267 ft (LTG is written and remains appropriate)    Time 4    Period Weeks    Status Achieved               PT Long Term Goals - 04/29/21 1715       PT LONG TERM GOAL #1   Title Pt will be independent with HEP for improved strength, balance, transfers, and gait.  TARGET 06/13/2021    Time 8    Period Weeks    Status On-going      PT LONG TERM GOAL #2   Title Pt will improve DGI score to at least 19/24 to decrease fall risk.    Baseline 15/24    Time 8    Status On-going      PT LONG TERM GOAL #3   Title Patient will demo ability to complete situation 4 of  M-CTSIB for >/= 15 seconds to improve balance.    Time 8    Period Weeks    Status On-going      PT LONG TERM GOAL #4   Title Pt will verbalize plans for continued community fitness upon d/c from PT to maximize gains made in therapy.    Time 8    Period Weeks    Status On-going      PT LONG TERM GOAL #5   Title Patient to complete 452mon 6 minute walk test with LRAD.    Time 8    Period Weeks    Status On-going                   Plan - 05/13/21 1407     Clinical Impression Statement Reviewed exercises in HEP and reiterated importance of consistent performance, as pt reports he is performing 3x/wk.  Assessed STGs, with pt meeting 2 of 3 STGs.  STG 1 met and STG 3 met.  Pt is progressing toward LTG of 6 MWT, from 983 ft initially to 1267 ft.  STG 2 not  met, with 5x sit<>stand remaining the same as initial measure.  With standing balance exercises (review of HEP), pt continues to have occasions of posterior and left LOB, so addressed that with rockerboard activities today.  Pt will continue to benefit from skilled PT towards LTGs for improved functional mobility and decreased fall risk.    Comorbidities PMH: pAF on Eliquis, chronic pain on oxycodone and Ambien, previous SDH, COPD on home O2 noct, HTN, hypothyroidism, OSA on CPAP, and DM, peripheral neuropathy    PT Frequency 1x / week    PT Duration 8 weeks    PT Treatment/Interventions ADLs/Self Care Home Management;Gait training;Functional mobility training;Therapeutic activities;Therapeutic exercise;Balance training;Neuromuscular re-education;Patient/family education    PT Next Visit Plan Continue to reassess HEP for  balance, making additions/updrages as needed; especially vestibular training for balance, lateral weigthshifting and hip strengthening    Consulted and Agree with Plan of Care Patient             Patient will benefit from skilled therapeutic intervention in order to improve the following deficits and  impairments:  Abnormal gait, Difficulty walking, Decreased balance, Decreased mobility, Decreased strength  Visit Diagnosis: Unsteadiness on feet  Other abnormalities of gait and mobility  Muscle weakness (generalized)     Problem List Patient Active Problem List   Diagnosis Date Noted   Aortic atherosclerosis ([place]) 02/04/2021   Mild cognitive impairment 01/04/2021   Fatty liver 12/29/2020   Thrombocytopenia 12/20/2020   Urinary frequency 12/20/2020   Arthritis of hand 11/08/2020   Generalized anxiety disorder 06/12/2020   Recurrent falls 03/25/2020   SDH (subdural hematoma) 03/21/2020   Leukocytosis 03/14/2020   Intracranial hemorrhage    Rotator cuff arthropathy of left shoulder 07/21/2019   Dupuytren's disease of palm 07/12/2019   Sebaceous cyst 06/27/2019   History of COVID-19 05/08/2019   Obstructive sleep apnea    Asymmetrical left sensorineural hearing loss 07/12/2018   Chronic anticoagulation 12/21/2017   Degeneration of thoracic intervertebral disc 09/21/2017   Thoracic back pain 08/10/2017   Degenerative joint disease of hand 07/14/2017   Osteoarthritis    Hypoxia    Fibromyalgia    Fatty tumor    BPH (benign prostatic hyperplasia)    Chronic atrial fibrillation    Insomnia 03/10/2017   Chronic pain, legs and back 03/10/2017   Hyperlipidemia 03/10/2017   Obesity (BMI 30.0-34.9) 12/08/2016   Major depressive disorder 09/07/2016   Spondylolisthesis 09/07/2016   Hypothyroidism 03/08/2016   GERD (gastroesophageal reflux disease) 03/08/2016   Gout 03/08/2016   Glaucoma 03/08/2016   Type 2 diabetes mellitus 03/08/2016   Hereditary and idiopathic peripheral neuropathy 08/28/2015   TIA (transient ischemic attack) 08/28/2015   Essential hypertension 12/04/2011   Allergic rhinitis, seasonal 12/04/2011   COPD (chronic obstructive pulmonary disease)  07/18/2011    Mady Haagensen W., PT 05/13/2021, 2:13 PM  [facility] [address], [place], [name], [address] Phone: [phone]   Fax:  [phone]  Name: Arney Mayabb MRN: [id number] Date of Birth: 12-01-1932

## 2021-05-16 ENCOUNTER — Other Ambulatory Visit: Payer: Self-pay | Admitting: Internal Medicine

## 2021-05-19 ENCOUNTER — Other Ambulatory Visit: Payer: Self-pay

## 2021-05-19 ENCOUNTER — Ambulatory Visit: Payer: Medicare Other | Attending: Nurse Practitioner | Admitting: Physical Therapy

## 2021-05-19 ENCOUNTER — Ambulatory Visit: Payer: Medicare Other

## 2021-05-19 ENCOUNTER — Encounter: Payer: Self-pay | Admitting: Physical Therapy

## 2021-05-19 DIAGNOSIS — M6281 Muscle weakness (generalized): Secondary | ICD-10-CM | POA: Insufficient documentation

## 2021-05-19 DIAGNOSIS — R2681 Unsteadiness on feet: Secondary | ICD-10-CM | POA: Diagnosis not present

## 2021-05-19 DIAGNOSIS — R4701 Aphasia: Secondary | ICD-10-CM | POA: Insufficient documentation

## 2021-05-19 DIAGNOSIS — R41841 Cognitive communication deficit: Secondary | ICD-10-CM

## 2021-05-19 DIAGNOSIS — R2689 Other abnormalities of gait and mobility: Secondary | ICD-10-CM | POA: Diagnosis not present

## 2021-05-19 NOTE — Therapy (Signed)
[facility] [address], [place], [name], [address] Phone: [phone]   Fax:  [phone]  Physical Therapy Treatment  Patient Details  Name: Jonathan [name] MRN: [id number] Date of Birth: 18-Aug-1932 Referring Provider (PT): Lazaro Arms, NP   Encounter Date: 05/19/2021   PT End of Session - 05/19/21 0933     Visit Number 6    Number of Visits 9    Date for PT Re-Evaluation 06/13/21    Authorization Type Medicare/BCBS    PT Start Time 0930    PT Stop Time 1012    PT Time Calculation (min) 42 min    Activity Tolerance Patient tolerated treatment well    Behavior During Therapy [facility] for tasks assessed/performed             Past Medical History:  Diagnosis Date   Acute encephalopathy 05/08/2019   Allergic rhinitis, seasonal 12/04/2011   Arthritis of hand 11/08/2020   BPH (benign prostatic hyperplasia)    Bronchitis    Cataract    Chronic anticoagulation 12/21/2017   CHADS VASC=4, Eliquis stopped Sept 2021- recurrent falls with SDH   Chronic atrial fibrillation    On Eliquis   Chronic pain, legs and back 03/10/2017   Preferred pain management Leg and back pain   Compression fracture of thoracic vertebra 09/21/2017   COPD (chronic obstructive pulmonary disease)    2 liters O2 HS   Degeneration of thoracic intervertebral disc 09/21/2017   Degenerative joint disease of hand 07/14/2017   Dupuytren's disease of palm 07/12/2019   Essential hypertension 12/04/2011   Fatty tumor    waste and back   Fibromyalgia    Generalized anxiety disorder 06/12/2020   12/21 He is reporting severe insomnia and anxiety symptoms.  Options are limited.  We will try a low-dose lorazepam at night and during the day for anxiety and panic attacks.  He will stop lorazepam if problems.  He will stop drinking alcohol.  Discontinue BuSpar.  Reduce Cymbalta to 1 a day.   GERD (gastroesophageal reflux disease)    Glaucoma 03/08/2016   Gout  03/08/2016   Hereditary and idiopathic peripheral neuropathy 08/28/2015   History of COVID-19 05/08/2019   2021 Post-COVID sx's -" brain fog" Try Lion's mane supplement and B complex with niacin for neuropathy   Hyperlipidemia 03/10/2017   Hypothyroidism    Hypoxia    Insomnia    Intracranial hemorrhage    Leukocytosis 03/14/2020   Major depressive disorder 09/07/2016   Obstructive sleep apnea    Osteoarthritis    Osteoporosis    Recurrent falls 03/25/2020   PT. Treat neuropathy, insomnia.  Reduce Cymbalta to 1 a day.  Discontinue BuSpar.   Rotator cuff arthropathy of left shoulder 07/21/2019   SDH (subdural hematoma) 03/21/2020   Recurrent SDH secondary to falls at home- Sept 2019 and again 03/14/2020- Eliquis stopped.   Sebaceous cyst 06/27/2019   Spinal compression fracture seventh vertebre   Spondylolisthesis 09/07/2016   On fosamax - for about one year - Dr Alyson Ingles  10/14/17 dexa: normal dexa -- spine 2.1,   RFN -0.9,  LFN   -0.7   - no comparison on file, previous fracture      Thoracic back pain 08/10/2017   Thrombocytopenia 12/20/2020   TIA (transient ischemic attack) 08/28/2015   Type 2 diabetes mellitus 03/08/2016    Past Surgical History:  Procedure Laterality Date   APPENDECTOMY  age 87   CARPAL TUNNEL RELEASE Right  early 2000s   CATARACT EXTRACTION     bilateral   CHOLECYSTECTOMY  age 87   EYE SURGERY     FOOT ARTHRODESIS Right 02/02/2013   Procedure: RIGHT HALLUX METATARSAL PHALANGEAL JOINT ARTHRODESIS ;  Surgeon: Wylene Simmer, MD;  Location: [facility];  Service: Orthopedics;  Laterality: Right;   INGUINAL HERNIA REPAIR  age 87   rt side   NASAL CONCHA BULLOSA RESECTION  age 87   PROSTATE SURGERY     SHOULDER ARTHROSCOPY W/ ROTATOR CUFF REPAIR Right    early 2000s   tonsil     VASECTOMY  age 87    There were no vitals filed for this visit.   Subjective Assessment - 05/19/21 0932     Subjective Having more episodes of A-fib.  Can tell  because it shows on my watch.  I experience a lot of fatigue.    Pertinent History PMH: pAF on Eliquis (to see cardiologist on 04/23/21), chronic pain on oxycodone and Ambien, previous SDH, COPD on home O2 noct, HTN, hypothyroidism, OSA on CPAP, and DM    Patient Stated Goals Pt's goal for therapy is to improve balance, strengthening, and walking.    Currently in Pain? No/denies                               [facility] Adult PT Treatment/Exercise - 05/19/21 0001       Ambulation/Gait   Ambulation/Gait Yes    Ambulation/Gait Assistance 5: Supervision    Assistive device None    Gait Pattern Step-through pattern;Wide base of support    Ambulation Surface Level;Indoor    Gait Comments Gait with head turns/head nods x 20 ft, 4 reps each; then gait negotiating hurdles, 3 reps x 4 sets;  pt has one episode of R foot catching hurdle and is able to take longer strides to clear after that episode.      Knee/Hip Exercises: Seated   Sit to Sand 10 reps;without UE support;2 sets   2nd set standing on Airex           Reviewed exercises Added to HEP last visit:  Pt return demo understanding in session, but reports he hasn't yet done them at home.  Forward and Backward Step Over with Beam (BKA) - 1 x daily - 5 x weekly - 2 sets - 10 reps (modified instructions) Standing Tandem Balance with Counter Support - 1 x daily - 5 x weekly - 1 sets - 3 reps - 30 sec hold     Balance Exercises - 05/19/21 0001       Balance Exercises: Standing   Standing Eyes Opened Wide (BOA);Narrow base of support (BOS);Foam/compliant surface;Limitations    Standing Eyes Opened Limitations Head turns and nods x 10    Standing Eyes Closed Wide (BOA);Narrow base of support (BOS);Foam/compliant surface;Limitations    Standing Eyes Closed Limitations 2 reps x 15 sec with UE support    SLS with Vectors Solid surface;Foam/compliant surface;Intermittent upper extremity assist;Limitations    SLS with Vectors  Limitations Alt forward step taps to 6" step, then 12" step x 10, then side step taps x 10 to 6" step    Standing, One Foot on a Step Eyes open;Head turns;5 reps   3 sets   Wall Bumps Hip;10 reps;Limitations    Wall Bumps Limitations 2 sets; on Airex    Stepping Strategy Anterior;Posterior;Lateral;Foam/compliant surface;UE support;10 reps;Limitations    Stepping  Strategy Limitations Cues for foot clearance, light UE support throughout    Step Ups Forward;6 inch;UE support 2;Limitations    Step Ups Limitations step up/up, down/down    Heel Raises Both;10 reps;Limitations    Heel Raises Limitations 2 sets; on Airex    Toe Raise Both;10 reps;Limitations    Toe Raise Limitations 2 sets; on airex                  PT Short Term Goals - 05/13/21 1338       PT SHORT TERM GOAL #1   Title Pt will be independent with HEP for improved strength, balance, transfers, and gait.  TARGET 05/16/2021    Time 4    Period Weeks    Status Achieved      PT SHORT TERM GOAL #2   Title Pt will improve 5x sit<>stand to less than or equal to 11.5 sec to demonstrate improved functional strength and transfer efficiency.    Baseline 12.38 sec with decreased forward lean to initiate; 05/13/21:  15.47 sec, 2nd trial 12.38 sec    Time 4    Period Weeks    Status Not Met      PT SHORT TERM GOAL #3   Title 6MWT to be assessed and goal written as appropriate.    Baseline initial 983 ft, 05/13/21 1267 ft (LTG is written and remains appropriate)    Time 4    Period Weeks    Status Achieved               PT Long Term Goals - 04/29/21 1715       PT LONG TERM GOAL #1   Title Pt will be independent with HEP for improved strength, balance, transfers, and gait.  TARGET 06/13/2021    Time 8    Period Weeks    Status On-going      PT LONG TERM GOAL #2   Title Pt will improve DGI score to at least 19/24 to decrease fall risk.    Baseline 15/24    Time 8    Status On-going      PT LONG TERM GOAL #3    Title Patient will demo ability to complete situation 4 of M-CTSIB for >/= 15 seconds to improve balance.    Time 8    Period Weeks    Status On-going      PT LONG TERM GOAL #4   Title Pt will verbalize plans for continued community fitness upon d/c from PT to maximize gains made in therapy.    Time 8    Period Weeks    Status On-going      PT LONG TERM GOAL #5   Title Patient to complete 463mon 6 minute walk test with LRAD.    Time 8    Period Weeks    Status On-going                   Plan - 05/19/21 0933     Clinical Impression Statement Skilled PT session focused on balance activties on compliant surface, single limb stance, and dynamic balance/gait activities.  Pt requires intermittent/occasional UE support throughout compliant surface acitvities; with single limb stance, he requires consistent light UE support.  With dynamic gait activities, he continues to slow gait with head turns/nods.  He will continue to benefit from skilled PT to further address balance and gait for improved mobilty and decreased fall risk.    Comorbidities PMH: pAF on  Eliquis, chronic pain on oxycodone and Ambien, previous SDH, COPD on home O2 noct, HTN, hypothyroidism, OSA on CPAP, and DM, peripheral neuropathy    PT Frequency 1x / week    PT Duration 8 weeks    PT Treatment/Interventions ADLs/Self Care Home Management;Gait training;Functional mobility training;Therapeutic activities;Therapeutic exercise;Balance training;Neuromuscular re-education;Patient/family education    PT Next Visit Plan Making additions/updrages to HEP for compliant surface/vestibular system use for balance as needed; vestibular training for balance, lateral weigthshifting and hip strengthening, gait with head turns/nods and compliant surfaces    Consulted and Agree with Plan of Care Patient             Patient will benefit from skilled therapeutic intervention in order to improve the following deficits and  impairments:  Abnormal gait, Difficulty walking, Decreased balance, Decreased mobility, Decreased strength  Visit Diagnosis: Unsteadiness on feet  Other abnormalities of gait and mobility  Muscle weakness (generalized)     Problem List Patient Active Problem List   Diagnosis Date Noted   Aortic atherosclerosis ([place]) 02/04/2021   Mild cognitive impairment 01/04/2021   Fatty liver 12/29/2020   Thrombocytopenia 12/20/2020   Urinary frequency 12/20/2020   Arthritis of hand 11/08/2020   Generalized anxiety disorder 06/12/2020   Recurrent falls 03/25/2020   SDH (subdural hematoma) 03/21/2020   Leukocytosis 03/14/2020   Intracranial hemorrhage    Rotator cuff arthropathy of left shoulder 07/21/2019   Dupuytren's disease of palm 07/12/2019   Sebaceous cyst 06/27/2019   History of COVID-19 05/08/2019   Obstructive sleep apnea    Asymmetrical left sensorineural hearing loss 07/12/2018   Chronic anticoagulation 12/21/2017   Degeneration of thoracic intervertebral disc 09/21/2017   Thoracic back pain 08/10/2017   Degenerative joint disease of hand 07/14/2017   Osteoarthritis    Hypoxia    Fibromyalgia    Fatty tumor    BPH (benign prostatic hyperplasia)    Chronic atrial fibrillation    Insomnia 03/10/2017   Chronic pain, legs and back 03/10/2017   Hyperlipidemia 03/10/2017   Obesity (BMI 30.0-34.9) 12/08/2016   Major depressive disorder 09/07/2016   Spondylolisthesis 09/07/2016   Hypothyroidism 03/08/2016   GERD (gastroesophageal reflux disease) 03/08/2016   Gout 03/08/2016   Glaucoma 03/08/2016   Type 2 diabetes mellitus 03/08/2016   Hereditary and idiopathic peripheral neuropathy 08/28/2015   TIA (transient ischemic attack) 08/28/2015   Essential hypertension 12/04/2011   Allergic rhinitis, seasonal 12/04/2011   COPD (chronic obstructive pulmonary disease)  07/18/2011    Matthias Bogus W., PT 05/19/2021, 10:17 AM  [facility] [address] [place], [name], [address] Phone: [phone]   Fax:  [phone]  Name: Nassir Neidert MRN: [id number] Date of Birth: Sep 29, 1932

## 2021-05-19 NOTE — Therapy (Signed)
[facility] [address] [place], [name], [address] Phone: [phone]   Fax:  [phone]  Speech Language Pathology Treatment  Patient Details  Name: Jonathan [name] MRN: [id number] Date of Birth: 22-Jun-1932 Referring Provider (SLP): Lazaro Arms, NP   Encounter Date: 05/19/2021    Past Medical History:  Diagnosis Date   Acute encephalopathy 05/08/2019   Allergic rhinitis, seasonal 12/04/2011   Arthritis of hand 11/08/2020   BPH (benign prostatic hyperplasia)    Bronchitis    Cataract    Chronic anticoagulation 12/21/2017   CHADS VASC=4, Eliquis stopped Sept 2021- recurrent falls with SDH   Chronic atrial fibrillation    On Eliquis   Chronic pain, legs and back 03/10/2017   Preferred pain management Leg and back pain   Compression fracture of thoracic vertebra 09/21/2017   COPD (chronic obstructive pulmonary disease)    2 liters O2 HS   Degeneration of thoracic intervertebral disc 09/21/2017   Degenerative joint disease of hand 07/14/2017   Dupuytren's disease of palm 07/12/2019   Essential hypertension 12/04/2011   Fatty tumor    waste and back   Fibromyalgia    Generalized anxiety disorder 06/12/2020   12/21 He is reporting severe insomnia and anxiety symptoms.  Options are limited.  We will try a low-dose lorazepam at night and during the day for anxiety and panic attacks.  He will stop lorazepam if problems.  He will stop drinking alcohol.  Discontinue BuSpar.  Reduce Cymbalta to 1 a day.   GERD (gastroesophageal reflux disease)    Glaucoma 03/08/2016   Gout 03/08/2016   Hereditary and idiopathic peripheral neuropathy 08/28/2015   History of COVID-19 05/08/2019   2021 Post-COVID sx's -" brain fog" Try Lion's mane supplement and B complex with niacin for neuropathy   Hyperlipidemia 03/10/2017   Hypothyroidism    Hypoxia    Insomnia    Intracranial hemorrhage    Leukocytosis 03/14/2020   Major depressive  disorder 09/07/2016   Obstructive sleep apnea    Osteoarthritis    Osteoporosis    Recurrent falls 03/25/2020   PT. Treat neuropathy, insomnia.  Reduce Cymbalta to 1 a day.  Discontinue BuSpar.   Rotator cuff arthropathy of left shoulder 07/21/2019   SDH (subdural hematoma) 03/21/2020   Recurrent SDH secondary to falls at home- Sept 2019 and again 03/14/2020- Eliquis stopped.   Sebaceous cyst 06/27/2019   Spinal compression fracture seventh vertebre   Spondylolisthesis 09/07/2016   On fosamax - for about one year - Dr Alyson Ingles  10/14/17 dexa: normal dexa -- spine 2.1,   RFN -0.9,  LFN   -0.7   - no comparison on file, previous fracture      Thoracic back pain 08/10/2017   Thrombocytopenia 12/20/2020   TIA (transient ischemic attack) 08/28/2015   Type 2 diabetes mellitus 03/08/2016    Past Surgical History:  Procedure Laterality Date   APPENDECTOMY  age 28   [place] Right    early 2000s   CATARACT EXTRACTION     bilateral   CHOLECYSTECTOMY  age 25   EYE SURGERY     FOOT ARTHRODESIS Right 02/02/2013   Procedure: RIGHT HALLUX METATARSAL PHALANGEAL JOINT ARTHRODESIS ;  Surgeon: Wylene Simmer, MD;  Location: [facility];  Service: Orthopedics;  Laterality: Right;   INGUINAL HERNIA REPAIR  age 66   rt side   NASAL CONCHA BULLOSA RESECTION  age 49   PROSTATE SURGERY  SHOULDER ARTHROSCOPY W/ ROTATOR CUFF REPAIR Right    early 2000s   tonsil     VASECTOMY  age 35    There were no vitals filed for this visit.   Subjective Assessment - 05/19/21 1019     Subjective Pt fell on way from PT to ST - his rt leg hit the corner of the mat table - "I mainly hit my knees when I fell."    Currently in Pain? Yes    Pain Score 2     Pain Location Knee    Pain Orientation Right;Left    Pain Descriptors / Indicators Sore    Pain Type Acute pain    Pain Onset Today    Pain Frequency Constant                   ADULT SLP TREATMENT - 05/19/21 1021        General Information   Behavior/Cognition Alert;Cooperative;Pleasant mood      Treatment Provided   Treatment provided Cognitive-Linquistic      Cognitive-Linquistic Treatment   Treatment focused on Cognition    Skilled Treatment SLP began by providing pt with Cognitive quality of life measure. Pt's score was 93/135. SLUMS score was 21/25 which indicates mild neurocognitive disorder. With the self generated gerocognitive evaluation (SAGE) pt scored 17/21 which is also suggestive of mild cognitive disorder. Because pt endorses multiple supposed concussions SLP is unsure of prognosis for improvement at this time. Pt provided examples of difficulty with alternating attention, and attention to detail.      Assessment / Recommendations / Plan   Plan Continue with current plan of care      Progression Toward Goals   Progression toward goals Progressing toward goals                SLP Short Term Goals - 05/19/21 1707       SLP SHORT TERM GOAL #1   Title pt will undergo cognitive linguistic testing and goals added PRN    Time 1    Period Weeks    Status Achieved    Target Date 05/23/21      SLP SHORT TERM GOAL #2   Title pt will report using 3 compensations for attention between 2 sessions    Time 2    Period Weeks    Status New    Target Date 05/30/21              SLP Long Term Goals - 05/19/21 1708       SLP LONG TERM GOAL #1   Title pt will demo independence with both semantic feature analysis (SFA) and verb network strengthening treatment (VNeST)    Time 1    Period --   sessions   Status On-going    Target Date 05/16/21      SLP LONG TERM GOAL #2   Title pt will report that he feels more confident than prior to ST with his conversational ability    Time 1    Period --   sessions   Status Deferred    Target Date 05/16/21      SLP LONG TERM GOAL #3   Title pt will report using 3 compensations for attention between 2 sessions    Time 6    Period Weeks     Status New    Target Date 06/27/21      SLP LONG TERM GOAL #4   Title pt will catch errors  made on more functional detailed tasks in 3 sessions using compensations    Time 6    Period Weeks    Target Date 06/27/21      SLP LONG TERM GOAL #5   Title pt will score better on cognitive QOL measure than on 05-19-21    Time 6    Period Weeks    Status New    Target Date 06/27/21               Patient will benefit from skilled therapeutic intervention in order to improve the following deficits and impairments:   Cognitive communication deficit  Aphasia    Problem List Patient Active Problem List   Diagnosis Date Noted   Aortic atherosclerosis ([place]) 02/04/2021   Mild cognitive impairment 01/04/2021   Fatty liver 12/29/2020   Thrombocytopenia 12/20/2020   Urinary frequency 12/20/2020   Arthritis of hand 11/08/2020   Generalized anxiety disorder 06/12/2020   Recurrent falls 03/25/2020   SDH (subdural hematoma) 03/21/2020   Leukocytosis 03/14/2020   Intracranial hemorrhage    Rotator cuff arthropathy of left shoulder 07/21/2019   Dupuytren's disease of palm 07/12/2019   Sebaceous cyst 06/27/2019   History of COVID-19 05/08/2019   Obstructive sleep apnea    Asymmetrical left sensorineural hearing loss 07/12/2018   Chronic anticoagulation 12/21/2017   Degeneration of thoracic intervertebral disc 09/21/2017   Thoracic back pain 08/10/2017   Degenerative joint disease of hand 07/14/2017   Osteoarthritis    Hypoxia    Fibromyalgia    Fatty tumor    BPH (benign prostatic hyperplasia)    Chronic atrial fibrillation    Insomnia 03/10/2017   Chronic pain, legs and back 03/10/2017   Hyperlipidemia 03/10/2017   Obesity (BMI 30.0-34.9) 12/08/2016   Major depressive disorder 09/07/2016   Spondylolisthesis 09/07/2016   Hypothyroidism 03/08/2016   GERD (gastroesophageal reflux disease) 03/08/2016   Gout 03/08/2016   Glaucoma 03/08/2016   Type 2 diabetes mellitus 03/08/2016    Hereditary and idiopathic peripheral neuropathy 08/28/2015   TIA (transient ischemic attack) 08/28/2015   Essential hypertension 12/04/2011   Allergic rhinitis, seasonal 12/04/2011   COPD (chronic obstructive pulmonary disease)  07/18/2011    Garald Balding, CCC-SLP 05/19/2021, 5:11 PM  [facility] [place] [address] [place], [name], [address] Phone: [phone]   Fax:  [phone]   Name: Jonathan [name] MRN: [id number] Date of Birth: 1933/04/21

## 2021-05-23 DIAGNOSIS — R350 Frequency of micturition: Secondary | ICD-10-CM | POA: Diagnosis not present

## 2021-05-23 DIAGNOSIS — N401 Enlarged prostate with lower urinary tract symptoms: Secondary | ICD-10-CM | POA: Diagnosis not present

## 2021-05-23 DIAGNOSIS — R351 Nocturia: Secondary | ICD-10-CM | POA: Diagnosis not present

## 2021-05-27 ENCOUNTER — Ambulatory Visit: Payer: Medicare Other

## 2021-05-27 ENCOUNTER — Encounter: Payer: Self-pay | Admitting: Physical Therapy

## 2021-05-27 ENCOUNTER — Ambulatory Visit: Payer: Medicare Other | Admitting: Physical Therapy

## 2021-05-27 ENCOUNTER — Other Ambulatory Visit: Payer: Self-pay

## 2021-05-27 DIAGNOSIS — R2681 Unsteadiness on feet: Secondary | ICD-10-CM

## 2021-05-27 DIAGNOSIS — R4701 Aphasia: Secondary | ICD-10-CM | POA: Diagnosis not present

## 2021-05-27 DIAGNOSIS — M6281 Muscle weakness (generalized): Secondary | ICD-10-CM

## 2021-05-27 DIAGNOSIS — R41841 Cognitive communication deficit: Secondary | ICD-10-CM | POA: Diagnosis not present

## 2021-05-27 DIAGNOSIS — R2689 Other abnormalities of gait and mobility: Secondary | ICD-10-CM | POA: Diagnosis not present

## 2021-05-27 NOTE — Therapy (Signed)
[facility] [address] [place], [name], [address] Phone: [phone]   Fax:  [phone]  Physical Therapy Treatment  Patient Details  Name: Jonathan [name] MRN: [id number] Date of Birth: 25-Mar-1933 Referring Provider (PT): Lazaro Arms, NP   Encounter Date: 05/27/2021   PT End of Session - 05/27/21 1108     Visit Number 7    Number of Visits 9    Date for PT Re-Evaluation 06/13/21    Authorization Type Medicare/BCBS    PT Start Time 1105    PT Stop Time 6789    PT Time Calculation (min) 40 min    Activity Tolerance Patient tolerated treatment well    Behavior During Therapy [facility] for tasks assessed/performed             Past Medical History:  Diagnosis Date   Acute encephalopathy 05/08/2019   Allergic rhinitis, seasonal 12/04/2011   Arthritis of hand 11/08/2020   BPH (benign prostatic hyperplasia)    Bronchitis    Cataract    Chronic anticoagulation 12/21/2017   CHADS VASC=4, Eliquis stopped Sept 2021- recurrent falls with SDH   Chronic atrial fibrillation    On Eliquis   Chronic pain, legs and back 03/10/2017   Preferred pain management Leg and back pain   Compression fracture of thoracic vertebra 09/21/2017   COPD (chronic obstructive pulmonary disease)    2 liters O2 HS   Degeneration of thoracic intervertebral disc 09/21/2017   Degenerative joint disease of hand 07/14/2017   Dupuytren's disease of palm 07/12/2019   Essential hypertension 12/04/2011   Fatty tumor    waste and back   Fibromyalgia    Generalized anxiety disorder 06/12/2020   12/21 He is reporting severe insomnia and anxiety symptoms.  Options are limited.  We will try a low-dose lorazepam at night and during the day for anxiety and panic attacks.  He will stop lorazepam if problems.  He will stop drinking alcohol.  Discontinue BuSpar.  Reduce Cymbalta to 1 a day.   GERD (gastroesophageal reflux disease)    Glaucoma 03/08/2016   Gout  03/08/2016   Hereditary and idiopathic peripheral neuropathy 08/28/2015   History of COVID-19 05/08/2019   2021 Post-COVID sx's -" brain fog" Try Lion's mane supplement and B complex with niacin for neuropathy   Hyperlipidemia 03/10/2017   Hypothyroidism    Hypoxia    Insomnia    Intracranial hemorrhage    Leukocytosis 03/14/2020   Major depressive disorder 09/07/2016   Obstructive sleep apnea    Osteoarthritis    Osteoporosis    Recurrent falls 03/25/2020   PT. Treat neuropathy, insomnia.  Reduce Cymbalta to 1 a day.  Discontinue BuSpar.   Rotator cuff arthropathy of left shoulder 07/21/2019   SDH (subdural hematoma) 03/21/2020   Recurrent SDH secondary to falls at home- Sept 2019 and again 03/14/2020- Eliquis stopped.   Sebaceous cyst 06/27/2019   Spinal compression fracture seventh vertebre   Spondylolisthesis 09/07/2016   On fosamax - for about one year - Dr Alyson Ingles  10/14/17 dexa: normal dexa -- spine 2.1,   RFN -0.9,  LFN   -0.7   - no comparison on file, previous fracture      Thoracic back pain 08/10/2017   Thrombocytopenia 12/20/2020   TIA (transient ischemic attack) 08/28/2015   Type 2 diabetes mellitus 03/08/2016    Past Surgical History:  Procedure Laterality Date   APPENDECTOMY  age 76   CARPAL TUNNEL RELEASE Right  early 2000s   CATARACT EXTRACTION     bilateral   CHOLECYSTECTOMY  age 65   EYE SURGERY     FOOT ARTHRODESIS Right 02/02/2013   Procedure: RIGHT HALLUX METATARSAL PHALANGEAL JOINT ARTHRODESIS ;  Surgeon: Wylene Simmer, MD;  Location: [facility];  Service: Orthopedics;  Laterality: Right;   INGUINAL HERNIA REPAIR  age 42   rt side   NASAL CONCHA BULLOSA RESECTION  age 54   PROSTATE SURGERY     SHOULDER ARTHROSCOPY W/ ROTATOR CUFF REPAIR Right    early 2000s   tonsil     VASECTOMY  age 62    There were no vitals filed for this visit.   Subjective Assessment - 05/27/21 1107     Subjective Have been having more difficulty with  sleeping; having some bowel trouble, but otherwise okay.  Been doing the exercises and have been doing several 12 minute walks.    Pertinent History PMH: pAF on Eliquis (to see cardiologist on 04/23/21), chronic pain on oxycodone and Ambien, previous SDH, COPD on home O2 noct, HTN, hypothyroidism, OSA on CPAP, and DM    Patient Stated Goals Pt's goal for therapy is to improve balance, strengthening, and walking.    Currently in Pain? No/denies                                    Balance Exercises - 05/27/21 0001       Balance Exercises: Standing   Standing Eyes Opened Wide (BOA);Narrow base of support (BOS);Foam/compliant surface;Limitations    Standing Eyes Opened Limitations Head turns and nods x 10   light UE support   Standing Eyes Closed Wide (BOA);Narrow base of support (BOS);Foam/compliant surface;Limitations    Standing Eyes Closed Limitations 2 reps x 15 sec with UE support   light UE support   Stepping Strategy Anterior;Posterior;Lateral;Foam/compliant surface;UE support;10 reps;Limitations    Stepping Strategy Limitations Cues for initial foot clearance, then widened BOS upon return to midline.  Progressed to standing on Airex, for 2nd set each direction   Pt has one episode of taking extra steps to regain balance in the forward direction, needs UE support for balance.   Tandem Gait Forward;Intermittent upper extremity support;4 reps;Limitations    Tandem Gait Limitations Lateral lean at times; improved stability with cues for use of visual target at eye level    Retro Gait 4 reps   Forward/back walking along counter, no UE support   Sidestepping 4 reps    Sidestepping Limitations no UE support, R and L along counter    Marching Solid surface;Upper extremity assist 1;Dynamic;Forwards;Retro;Limitations    Marching Limitations Light UE support, 3 reps    Heel Raises Both;10 reps;Limitations    Heel Raises Limitations 2 sets, solid surface    Toe Raise  Both;10 reps;Limitations    Toe Raise Limitations 2 sets, solid surface    Other Standing Exercises Stagger stance forward/back rocking, 10 reps each foot position.      Balance Exercises: Seated   Heel Raises 10 reps;Both    Toe Raise 10 reps;Both    Other Seated Exercises Pt reports he is feeling stiffness in his feet, almost like plantar fascitis, not pain    Other Seated Exercises Comments Used foam roller under bilateral feet, rolling forward/back x 10 reps.  Pt has roller at home and explained how he can use it at home without shoes on.  PT Short Term Goals - 05/13/21 1338       PT SHORT TERM GOAL #1   Title Pt will be independent with HEP for improved strength, balance, transfers, and gait.  TARGET 05/16/2021    Time 4    Period Weeks    Status Achieved      PT SHORT TERM GOAL #2   Title Pt will improve 5x sit<>stand to less than or equal to 11.5 sec to demonstrate improved functional strength and transfer efficiency.    Baseline 12.38 sec with decreased forward lean to initiate; 05/13/21:  15.47 sec, 2nd trial 12.38 sec    Time 4    Period Weeks    Status Not Met      PT SHORT TERM GOAL #3   Title 6MWT to be assessed and goal written as appropriate.    Baseline initial 983 ft, 05/13/21 1267 ft (LTG is written and remains appropriate)    Time 4    Period Weeks    Status Achieved               PT Long Term Goals - 04/29/21 1715       PT LONG TERM GOAL #1   Title Pt will be independent with HEP for improved strength, balance, transfers, and gait.  TARGET 06/13/2021    Time 8    Period Weeks    Status On-going      PT LONG TERM GOAL #2   Title Pt will improve DGI score to at least 19/24 to decrease fall risk.    Baseline 15/24    Time 8    Status On-going      PT LONG TERM GOAL #3   Title Patient will demo ability to complete situation 4 of M-CTSIB for >/= 15 seconds to improve balance.    Time 8    Period Weeks    Status  On-going      PT LONG TERM GOAL #4   Title Pt will verbalize plans for continued community fitness upon d/c from PT to maximize gains made in therapy.    Time 8    Period Weeks    Status On-going      PT LONG TERM GOAL #5   Title Patient to complete 429mon 6 minute walk test with LRAD.    Time 8    Period Weeks    Status On-going                   Plan - 05/27/21 1109     Clinical Impression Statement With standing balance exercises today, pt has several episodes of LOB, taking extra steps to regain balance. In addition, he has weightshift more posteriorly through his heels with return to midline with dynamic activities, causing a moment of brief LOB.  Session focused on ankle, hip, step strategy exercises to address balance.  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    Comorbidities PMH: pAF on Eliquis, chronic pain on oxycodone and Ambien, previous SDH, COPD on home O2 noct, HTN, hypothyroidism, OSA on CPAP, and DM, peripheral neuropathy    PT Frequency 1x / week    PT Duration 8 weeks    PT Treatment/Interventions ADLs/Self Care Home Management;Gait training;Functional mobility training;Therapeutic activities;Therapeutic exercise;Balance training;Neuromuscular re-education;Patient/family education    PT Next  Visit Plan Need to make additions/upgrades to HEP for compliant surface/vestibular system use for balance as well as ankle/hip/step strategy as needed; vestibular training for balance, lateral weigthshifting and hip strengthening, gait with head turns/nods and compliant surfaces.  Try resisted gait and side step activities for balance recovery    Consulted and Agree with Plan of Care Patient             Patient will benefit from skilled therapeutic intervention in  order to improve the following deficits and impairments:  Abnormal gait, Difficulty walking, Decreased balance, Decreased mobility, Decreased strength  Visit Diagnosis: Unsteadiness on feet  Muscle weakness (generalized)     Problem List Patient Active Problem List   Diagnosis Date Noted   Aortic atherosclerosis ([place]) 02/04/2021   Mild cognitive impairment 01/04/2021   Fatty liver 12/29/2020   Thrombocytopenia 12/20/2020   Urinary frequency 12/20/2020   Arthritis of hand 11/08/2020   Generalized anxiety disorder 06/12/2020   Recurrent falls 03/25/2020   SDH (subdural hematoma) 03/21/2020   Leukocytosis 03/14/2020   Intracranial hemorrhage    Rotator cuff arthropathy of left shoulder 07/21/2019   Dupuytren's disease of palm 07/12/2019   Sebaceous cyst 06/27/2019   History of COVID-19 05/08/2019   Obstructive sleep apnea    Asymmetrical left sensorineural hearing loss 07/12/2018   Chronic anticoagulation 12/21/2017   Degeneration of thoracic intervertebral disc 09/21/2017   Thoracic back pain 08/10/2017   Degenerative joint disease of hand 07/14/2017   Osteoarthritis    Hypoxia    Fibromyalgia    Fatty tumor    BPH (benign prostatic hyperplasia)    Chronic atrial fibrillation    Insomnia 03/10/2017   Chronic pain, legs and back 03/10/2017   Hyperlipidemia 03/10/2017   Obesity (BMI 30.0-34.9) 12/08/2016   Major depressive disorder 09/07/2016   Spondylolisthesis 09/07/2016   Hypothyroidism 03/08/2016   GERD (gastroesophageal reflux disease) 03/08/2016   Gout 03/08/2016   Glaucoma 03/08/2016   Type 2 diabetes mellitus 03/08/2016   Hereditary and idiopathic peripheral neuropathy 08/28/2015   TIA (transient ischemic attack) 08/28/2015   Essential hypertension 12/04/2011   Allergic rhinitis, seasonal 12/04/2011   COPD (chronic obstructive pulmonary disease)  07/18/2011    Layia Walla W., PT 05/27/2021, 11:55 AM  [facility] [address] [place], [name], [address] Phone: [phone]   Fax:  [phone]  Name: Jonathan [name] MRN: [id number] Date of Birth: 10-Oct-1932

## 2021-05-29 ENCOUNTER — Telehealth: Payer: Self-pay | Admitting: Internal Medicine

## 2021-05-29 NOTE — Telephone Encounter (Signed)
Type of form received (Home Health, FMLA, disability, handicapped placard, Surgical clearance) Patient Assistance Forms Form placed in (E-fax folder, Provider mailbox) Provider mailbox  Additional instructions from the patient (mail, fax, notify by phone when complete) Notify patient when complete  Things to remember: [facility] office: If form received in person, remind patient that forms take 7-10 business days CMA should attach charge sheet and put on Supervisor's desk

## 2021-05-29 NOTE — Telephone Encounter (Signed)
N/A unable to leave a message for patient to call me back at [phone] to schedule Medicare Annual Wellness Visit   Last AWV  04/26/20  Please schedule at anytime with [facility] if patient calls the office back.    40 Minutes appointment   Any questions, please call me at [phone]

## 2021-05-30 NOTE — Telephone Encounter (Signed)
Form received and placed in folder for signature

## 2021-06-03 ENCOUNTER — Ambulatory Visit: Payer: Medicare Other | Admitting: Physical Therapy

## 2021-06-03 ENCOUNTER — Ambulatory Visit: Payer: Medicare Other

## 2021-06-03 ENCOUNTER — Other Ambulatory Visit: Payer: Self-pay

## 2021-06-03 ENCOUNTER — Encounter: Payer: Self-pay | Admitting: Physical Therapy

## 2021-06-03 DIAGNOSIS — R41841 Cognitive communication deficit: Secondary | ICD-10-CM | POA: Diagnosis not present

## 2021-06-03 DIAGNOSIS — R2689 Other abnormalities of gait and mobility: Secondary | ICD-10-CM | POA: Diagnosis not present

## 2021-06-03 DIAGNOSIS — R4701 Aphasia: Secondary | ICD-10-CM | POA: Diagnosis not present

## 2021-06-03 DIAGNOSIS — M6281 Muscle weakness (generalized): Secondary | ICD-10-CM | POA: Diagnosis not present

## 2021-06-03 DIAGNOSIS — R2681 Unsteadiness on feet: Secondary | ICD-10-CM

## 2021-06-03 NOTE — Therapy (Signed)
[facility] [address], [place], [name], [address] Phone: [phone]   Fax:  [phone]  Speech Language Pathology Treatment  Patient Details  Name: Jonathan [name] MRN: [id number] Date of Birth: February 04, 1933 Referring Provider (SLP): Lazaro Arms, NP   Encounter Date: 06/03/2021   End of Session - 06/03/21 1359     Visit Number 5    Number of Visits 9    Date for SLP Re-Evaluation 06/27/21    SLP Start Time 8453    SLP Stop Time  1100    SLP Time Calculation (min) 42 min    Activity Tolerance Patient tolerated treatment well             Past Medical History:  Diagnosis Date   Acute encephalopathy 05/08/2019   Allergic rhinitis, seasonal 12/04/2011   Arthritis of hand 11/08/2020   BPH (benign prostatic hyperplasia)    Bronchitis    Cataract    Chronic anticoagulation 12/21/2017   CHADS VASC=4, Eliquis stopped Sept 2021- recurrent falls with SDH   Chronic atrial fibrillation    On Eliquis   Chronic pain, legs and back 03/10/2017   Preferred pain management Leg and back pain   Compression fracture of thoracic vertebra 09/21/2017   COPD (chronic obstructive pulmonary disease)    2 liters O2 HS   Degeneration of thoracic intervertebral disc 09/21/2017   Degenerative joint disease of hand 07/14/2017   Dupuytren's disease of palm 07/12/2019   Essential hypertension 12/04/2011   Fatty tumor    waste and back   Fibromyalgia    Generalized anxiety disorder 06/12/2020   12/21 He is reporting severe insomnia and anxiety symptoms.  Options are limited.  We will try a low-dose lorazepam at night and during the day for anxiety and panic attacks.  He will stop lorazepam if problems.  He will stop drinking alcohol.  Discontinue BuSpar.  Reduce Cymbalta to 1 a day.   GERD (gastroesophageal reflux disease)    Glaucoma 03/08/2016   Gout 03/08/2016   Hereditary and idiopathic peripheral neuropathy 08/28/2015   History of  COVID-19 05/08/2019   2021 Post-COVID sx's -" brain fog" Try Lion's mane supplement and B complex with niacin for neuropathy   Hyperlipidemia 03/10/2017   Hypothyroidism    Hypoxia    Insomnia    Intracranial hemorrhage    Leukocytosis 03/14/2020   Major depressive disorder 09/07/2016   Obstructive sleep apnea    Osteoarthritis    Osteoporosis    Recurrent falls 03/25/2020   PT. Treat neuropathy, insomnia.  Reduce Cymbalta to 1 a day.  Discontinue BuSpar.   Rotator cuff arthropathy of left shoulder 07/21/2019   SDH (subdural hematoma) 03/21/2020   Recurrent SDH secondary to falls at home- Sept 2019 and again 03/14/2020- Eliquis stopped.   Sebaceous cyst 06/27/2019   Spinal compression fracture seventh vertebre   Spondylolisthesis 09/07/2016   On fosamax - for about one year - Dr Alyson Ingles  10/14/17 dexa: normal dexa -- spine 2.1,   RFN -0.9,  LFN   -0.7   - no comparison on file, previous fracture      Thoracic back pain 08/10/2017   Thrombocytopenia 12/20/2020   TIA (transient ischemic attack) 08/28/2015   Type 2 diabetes mellitus 03/08/2016    Past Surgical History:  Procedure Laterality Date   APPENDECTOMY  age 45   [place] Right    early 2000s   CATARACT EXTRACTION     bilateral  CHOLECYSTECTOMY  age 57   EYE SURGERY     FOOT ARTHRODESIS Right 02/02/2013   Procedure: RIGHT HALLUX METATARSAL PHALANGEAL JOINT ARTHRODESIS ;  Surgeon: Wylene Simmer, MD;  Location: [facility];  Service: Orthopedics;  Laterality: Right;   INGUINAL HERNIA REPAIR  age 50   rt side   NASAL CONCHA BULLOSA RESECTION  age 17   PROSTATE SURGERY     SHOULDER ARTHROSCOPY W/ ROTATOR CUFF REPAIR Right    early 2000s   tonsil     VASECTOMY  age 68    There were no vitals filed for this visit.   Subjective Assessment - 06/03/21 1024     Currently in Pain? Yes    Pain Score 3     Pain Location Knee    Pain Orientation Left    Pain Descriptors / Indicators Sore    Pain  Type Acute pain    Pain Onset Today   I did a rapid walk yesterday - it's a bad knee   Pain Frequency Constant                   ADULT SLP TREATMENT - 06/03/21 1025       General Information   Behavior/Cognition Alert;Cooperative;Pleasant mood      Treatment Provided   Treatment provided Cognitive-Linquistic      Cognitive-Linquistic Treatment   Treatment focused on Cognition    Skilled Treatment Pt stated he is using some compensations for attention such as alternating attention when necessary instead of atteimpting to multi-task/use divided attention. SLP and pt looked at pt's electronic calendar as he missed his last ST appointment due to not seeing it correctly on his calendar. Pt did not have his next ST appointment entered into his calendar correctly and found this error. He noted 3 other errors on his electronic calendar when comparing it to printed calendar SLP provided for him. "Sometimes it's on the schedule I get but it's disappeared in the iPad, and I swear I put it in." Pt stated he needs to use calendar app instead of paper/pencil calendar due to wife has her calendar on the app as well, and "a paper calendar would just get lost under piles." SLP encouraged pt to think about placing paper calendar on the refrigerator or on a file cabinet with magnets but pt seemed to think this was not possible and the electronic calendar is what he needed to continue to use. Pt was encouraged to double check ALL entries against his paper calendar or reminder cards obtained from MD offices and he agreed this was necessary based upon his level of difficulty today.      Assessment / Recommendations / Plan   Plan Continue with current plan of care      Progression Toward Goals   Progression toward goals Progressing toward goals              SLP Education - 06/03/21 1358     Education Details double check all entries into calendar app    Person(s) Educated Patient    Methods  Explanation;Demonstration;Verbal cues    Comprehension Verbalized understanding;Returned demonstration;Need further instruction              SLP Short Term Goals - 06/03/21 1401       SLP SHORT TERM GOAL #1   Title pt will undergo cognitive linguistic testing and goals added PRN    Status Achieved    Target Date 05/23/21  SLP SHORT TERM GOAL #2   Title pt will report using 3 compensations for attention between 2 sessions    Baseline 06-03-21    Time 2    Period Weeks    Status On-going    Target Date 06/13/21   extended two weeks due to pt not seen for two weeks             SLP Long Term Goals - 06/03/21 1402       SLP LONG TERM GOAL #1   Title pt will demo independence with both semantic feature analysis (SFA) and verb network strengthening treatment (VNeST)    Time 1    Period --   sessions   Status Not Met   and ongoing   Target Date 06/27/21      SLP LONG TERM GOAL #2   Title pt will report that he feels more confident than prior to [place] with his conversational ability    Time 1    Period --   sessions   Status Deferred      SLP LONG TERM GOAL #3   Title pt will report using 3 compensations for attention between 2 sessions    Time 6    Period Weeks    Status New    Target Date 06/27/21      SLP LONG TERM GOAL #4   Title pt will catch errors made on more functional detailed tasks in 3 sessions using compensations    Time 6    Period Weeks    Target Date 06/27/21      SLP LONG TERM GOAL #5   Title pt will score better on cognitive QOL measure than on 05-19-21    Time 6    Period Weeks    Status New              Plan - 06/03/21 1359     Clinical Impression Statement Pt complains of symptoms of word finding in conversation and cognitive linguistic deficits. Pt demonstrated difficulty with attention and attention to detail, and some decr'd emergent (error) awareness. He will benefit from skilled ST targeting word finding and these cognitive  linguistic deficits.    Speech Therapy Frequency 1x /week   every other week   Duration --   x6 visits   Treatment/Interventions SLP instruction and feedback;Patient/family education;Compensatory techniques;Language facilitation;Multimodal communcation approach    Potential to Achieve Goals Good    SLP Home Exercise Plan semantic feature analysis    Consulted and Agree with Plan of Care Patient             Patient will benefit from skilled therapeutic intervention in order to improve the following deficits and impairments:   Aphasia  Cognitive communication deficit    Problem List Patient Active Problem List   Diagnosis Date Noted   Aortic atherosclerosis ([place]) 02/04/2021   Mild cognitive impairment 01/04/2021   Fatty liver 12/29/2020   Thrombocytopenia 12/20/2020   Urinary frequency 12/20/2020   Arthritis of hand 11/08/2020   Generalized anxiety disorder 06/12/2020   Recurrent falls 03/25/2020   SDH (subdural hematoma) 03/21/2020   Leukocytosis 03/14/2020   Intracranial hemorrhage    Rotator cuff arthropathy of left shoulder 07/21/2019   Dupuytren's disease of palm 07/12/2019   Sebaceous cyst 06/27/2019   History of COVID-19 05/08/2019   Obstructive sleep apnea    Asymmetrical left sensorineural hearing loss 07/12/2018   Chronic anticoagulation 12/21/2017   Degeneration of thoracic intervertebral disc 09/21/2017  Thoracic back pain 08/10/2017   Degenerative joint disease of hand 07/14/2017   Osteoarthritis    Hypoxia    Fibromyalgia    Fatty tumor    BPH (benign prostatic hyperplasia)    Chronic atrial fibrillation    Insomnia 03/10/2017   Chronic pain, legs and back 03/10/2017   Hyperlipidemia 03/10/2017   Obesity (BMI 30.0-34.9) 12/08/2016   Major depressive disorder 09/07/2016   Spondylolisthesis 09/07/2016   Hypothyroidism 03/08/2016   GERD (gastroesophageal reflux disease) 03/08/2016   Gout 03/08/2016   Glaucoma 03/08/2016   Type 2 diabetes mellitus  03/08/2016   Hereditary and idiopathic peripheral neuropathy 08/28/2015   TIA (transient ischemic attack) 08/28/2015   Essential hypertension 12/04/2011   Allergic rhinitis, seasonal 12/04/2011   COPD (chronic obstructive pulmonary disease)  07/18/2011    Garald Balding, CCC-SLP 06/03/2021, 2:03 PM  [facility] [place] [address], [place], [name], [address] Phone: [phone]   Fax:  [phone]   Name: Garret Teale MRN: [id number] Date of Birth: 1933-01-02

## 2021-06-03 NOTE — Therapy (Signed)
[facility] [address] [place], [name], [address] Phone: [phone]   Fax:  [phone]  Physical Therapy Treatment  Patient Details  Name: Jonathan [name] MRN: [id number] Date of Birth: 1932-12-12 Referring Provider (PT): Lazaro Arms, NP   Encounter Date: 06/03/2021   PT End of Session - 06/03/21 1144     Visit Number 8    Number of Visits 9    Date for PT Re-Evaluation 06/13/21    Authorization Type Medicare/BCBS    PT Start Time 1105    PT Stop Time 1144    PT Time Calculation (min) 39 min    Equipment Utilized During Treatment Gait belt    Activity Tolerance Patient tolerated treatment well    Behavior During Therapy [facility] for tasks assessed/performed             Past Medical History:  Diagnosis Date   Acute encephalopathy 05/08/2019   Allergic rhinitis, seasonal 12/04/2011   Arthritis of hand 11/08/2020   BPH (benign prostatic hyperplasia)    Bronchitis    Cataract    Chronic anticoagulation 12/21/2017   CHADS VASC=4, Eliquis stopped Sept 2021- recurrent falls with SDH   Chronic atrial fibrillation    On Eliquis   Chronic pain, legs and back 03/10/2017   Preferred pain management Leg and back pain   Compression fracture of thoracic vertebra 09/21/2017   COPD (chronic obstructive pulmonary disease)    2 liters O2 HS   Degeneration of thoracic intervertebral disc 09/21/2017   Degenerative joint disease of hand 07/14/2017   Dupuytren's disease of palm 07/12/2019   Essential hypertension 12/04/2011   Fatty tumor    waste and back   Fibromyalgia    Generalized anxiety disorder 06/12/2020   12/21 He is reporting severe insomnia and anxiety symptoms.  Options are limited.  We will try a low-dose lorazepam at night and during the day for anxiety and panic attacks.  He will stop lorazepam if problems.  He will stop drinking alcohol.  Discontinue BuSpar.  Reduce Cymbalta to 1 a day.   GERD (gastroesophageal  reflux disease)    Glaucoma 03/08/2016   Gout 03/08/2016   Hereditary and idiopathic peripheral neuropathy 08/28/2015   History of COVID-19 05/08/2019   2021 Post-COVID sx's -" brain fog" Try Lion's mane supplement and B complex with niacin for neuropathy   Hyperlipidemia 03/10/2017   Hypothyroidism    Hypoxia    Insomnia    Intracranial hemorrhage    Leukocytosis 03/14/2020   Major depressive disorder 09/07/2016   Obstructive sleep apnea    Osteoarthritis    Osteoporosis    Recurrent falls 03/25/2020   PT. Treat neuropathy, insomnia.  Reduce Cymbalta to 1 a day.  Discontinue BuSpar.   Rotator cuff arthropathy of left shoulder 07/21/2019   SDH (subdural hematoma) 03/21/2020   Recurrent SDH secondary to falls at home- Sept 2019 and again 03/14/2020- Eliquis stopped.   Sebaceous cyst 06/27/2019   Spinal compression fracture seventh vertebre   Spondylolisthesis 09/07/2016   On fosamax - for about one year - Dr Alyson Ingles  10/14/17 dexa: normal dexa -- spine 2.1,   RFN -0.9,  LFN   -0.7   - no comparison on file, previous fracture      Thoracic back pain 08/10/2017   Thrombocytopenia 12/20/2020   TIA (transient ischemic attack) 08/28/2015   Type 2 diabetes mellitus 03/08/2016    Past Surgical History:  Procedure Laterality Date   APPENDECTOMY  age 22   CARPAL TUNNEL RELEASE Right    early 2000s   CATARACT EXTRACTION     bilateral   CHOLECYSTECTOMY  age 71   EYE SURGERY     FOOT ARTHRODESIS Right 02/02/2013   Procedure: RIGHT HALLUX METATARSAL PHALANGEAL JOINT ARTHRODESIS ;  Surgeon: Wylene Simmer, MD;  Location: [facility];  Service: Orthopedics;  Laterality: Right;   INGUINAL HERNIA REPAIR  age 78   rt side   NASAL CONCHA BULLOSA RESECTION  age 17   PROSTATE SURGERY     SHOULDER ARTHROSCOPY W/ ROTATOR CUFF REPAIR Right    early 2000s   tonsil     VASECTOMY  age 84    There were no vitals filed for this visit.   Subjective Assessment - 06/03/21 1107      Subjective Doing fairly well. Did a walk yesterday that usually takes him 12 minutes in 10 minutes.    Pertinent History PMH: pAF on Eliquis (to see cardiologist on 04/23/21), chronic pain on oxycodone and Ambien, previous SDH, COPD on home O2 noct, HTN, hypothyroidism, OSA on CPAP, and DM    Patient Stated Goals Pt's goal for therapy is to improve balance, strengthening, and walking.    Currently in Pain? Yes    Pain Score 3     Pain Location Knee    Pain Orientation Left    Pain Descriptors / Indicators Sore    Pain Type Chronic pain                               [facility] Adult PT Treatment/Exercise - 06/03/21 0001       Neuro Re-ed    Neuro Re-ed Details  R/L fwd/back step + head nods 10x each LE with CGA-min A for stability   cues to widen BOS     Knee/Hip Exercises: Aerobic   Nustep Level 5, UEs/LEs  x 6 minutes for leg strength, flexibility.      Knee/Hip Exercises: Standing   Other Standing Knee Exercises sidestepping, anterior and posterior monster walks with yellow loop 2x49f   CGA for safety                Balance Exercises - 06/03/21 0001       Balance Exercises: Standing   Standing Eyes Closed Narrow base of support (BOS);1 rep;30 secs    Standing Eyes Closed Limitations LOB requiring mod A to stabilize    Tandem Stance Eyes open;Upper extremity support 1;1 rep;20 secs    Other Standing Exercises R/L step fwd/back over 1/2 foam roll 5x    Other Standing Exercises Comments romberg head nods/turns 10x   LOB with head turns, requiring mod A to correct               PT Education - 06/03/21 1144     Education Details consolidation of HEP- Access Code: [email or web address]   Person(s) Educated Patient    Methods Explanation;Demonstration;Tactile cues;Verbal cues;Handout    Comprehension Returned demonstration;Verbalized understanding              PT Short Term Goals - 05/13/21 1338       PT SHORT TERM GOAL #1   Title Pt will be  independent with HEP for improved strength, balance, transfers, and gait.  TARGET 05/16/2021    Time 4    Period Weeks    Status Achieved      PT SHORT TERM  GOAL #2   Title Pt will improve 5x sit<>stand to less than or equal to 11.5 sec to demonstrate improved functional strength and transfer efficiency.    Baseline 12.38 sec with decreased forward lean to initiate; 05/13/21:  15.47 sec, 2nd trial 12.38 sec    Time 4    Period Weeks    Status Not Met      PT SHORT TERM GOAL #3   Title 6MWT to be assessed and goal written as appropriate.    Baseline initial 983 ft, 05/13/21 1267 ft (LTG is written and remains appropriate)    Time 4    Period Weeks    Status Achieved               PT Long Term Goals - 04/29/21 1715       PT LONG TERM GOAL #1   Title Pt will be independent with HEP for improved strength, balance, transfers, and gait.  TARGET 06/13/2021    Time 8    Period Weeks    Status On-going      PT LONG TERM GOAL #2   Title Pt will improve DGI score to at least 19/24 to decrease fall risk.    Baseline 15/24    Time 8    Status On-going      PT LONG TERM GOAL #3   Title Patient will demo ability to complete situation 4 of M-CTSIB for >/= 15 seconds to improve balance.    Time 8    Period Weeks    Status On-going      PT LONG TERM GOAL #4   Title Pt will verbalize plans for continued community fitness upon d/c from PT to maximize gains made in therapy.    Time 8    Period Weeks    Status On-going      PT LONG TERM GOAL #5   Title Patient to complete 426mon 6 minute walk test with LRAD.    Time 8    Period Weeks    Status On-going                   Plan - 06/03/21 1301     Clinical Impression Statement Patient arrived to session with report of improved tolerance for walking however noting L knee pain as a result of a walk yesterday. Worked on dynamic balance activities with head nods with cues to widen BOS; balance improved after these cues.  Worked on progressive hip strengthening with banded resistance, while providing balance challenge with cues to increase step length and decrease speed. Patient tolerated this well. Reviewed HEP and consolidated for max benefit. Patient still demonstrates LOB with narrow BOS, thus kept exercises that focused on Romberg positioning. Reminded patient to perform HEP at counter top for max safety. Patient reported understanding and without complaints at end of session.    Comorbidities PMH: pAF on Eliquis, chronic pain on oxycodone and Ambien, previous SDH, COPD on home O2 noct, HTN, hypothyroidism, OSA on CPAP, and DM, peripheral neuropathy    PT Frequency 1x / week    PT Duration 8 weeks    PT Treatment/Interventions ADLs/Self Care Home Management;Gait training;Functional mobility training;Therapeutic activities;Therapeutic exercise;Balance training;Neuromuscular re-education;Patient/family education    PT Next Visit Plan Need to make additions/upgrades to HEP for compliant surface/vestibular system use for balance as well as ankle/hip/step strategy as needed; vestibular training for balance, lateral weigthshifting and hip strengthening, gait with head turns/nods and compliant surfaces.  Try resisted gait  and side step activities for balance recovery    Consulted and Agree with Plan of Care Patient             Patient will benefit from skilled therapeutic intervention in order to improve the following deficits and impairments:  Abnormal gait, Difficulty walking, Decreased balance, Decreased mobility, Decreased strength  Visit Diagnosis: Unsteadiness on feet  Muscle weakness (generalized)  Other abnormalities of gait and mobility     Problem List Patient Active Problem List   Diagnosis Date Noted   Aortic atherosclerosis ([place]) 02/04/2021   Mild cognitive impairment 01/04/2021   Fatty liver 12/29/2020   Thrombocytopenia 12/20/2020   Urinary frequency 12/20/2020   Arthritis of hand  11/08/2020   Generalized anxiety disorder 06/12/2020   Recurrent falls 03/25/2020   SDH (subdural hematoma) 03/21/2020   Leukocytosis 03/14/2020   Intracranial hemorrhage    Rotator cuff arthropathy of left shoulder 07/21/2019   Dupuytren's disease of palm 07/12/2019   Sebaceous cyst 06/27/2019   History of COVID-19 05/08/2019   Obstructive sleep apnea    Asymmetrical left sensorineural hearing loss 07/12/2018   Chronic anticoagulation 12/21/2017   Degeneration of thoracic intervertebral disc 09/21/2017   Thoracic back pain 08/10/2017   Degenerative joint disease of hand 07/14/2017   Osteoarthritis    Hypoxia    Fibromyalgia    Fatty tumor    BPH (benign prostatic hyperplasia)    Chronic atrial fibrillation    Insomnia 03/10/2017   Chronic pain, legs and back 03/10/2017   Hyperlipidemia 03/10/2017   Obesity (BMI 30.0-34.9) 12/08/2016   Major depressive disorder 09/07/2016   Spondylolisthesis 09/07/2016   Hypothyroidism 03/08/2016   GERD (gastroesophageal reflux disease) 03/08/2016   Gout 03/08/2016   Glaucoma 03/08/2016   Type 2 diabetes mellitus 03/08/2016   Hereditary and idiopathic peripheral neuropathy 08/28/2015   TIA (transient ischemic attack) 08/28/2015   Essential hypertension 12/04/2011   Allergic rhinitis, seasonal 12/04/2011   COPD (chronic obstructive pulmonary disease)  07/18/2011     Janene Harvey, PT, DPT 06/03/21 1:04 PM   [facility] [address], [place], [name], [address] Phone: [phone]   Fax:  [phone]  Name: Jonathan [name] MRN: [id number] Date of Birth: 01-10-1933

## 2021-06-03 NOTE — Telephone Encounter (Signed)
Form faxed to [facility] this morning.

## 2021-06-06 ENCOUNTER — Other Ambulatory Visit: Payer: Self-pay | Admitting: Internal Medicine

## 2021-06-08 ENCOUNTER — Encounter: Payer: Self-pay | Admitting: Student

## 2021-06-08 NOTE — Progress Notes (Signed)
Cardiology Office Note:    Date:  06/17/2021   ID:  Jonathan [name], DOB 04-23-1933, MRN [id number]  PCP:  Jonathan Rail, MD  Cardiologist:  Jonathan Ruths, MD  Electrophysiologist:  None   Referring MD: Jonathan Rail, MD   Chief Complaint: follow-up of atrial fibrillation  History of Present Illness:    Jonathan [name] is a 87 y.o. male with a history of chronic atrial fibrillation pn Eliquis, mild to moderate mitral regurgitation, COPD, hypertension, hyperlipidemia,type 2 diabetes mellitus, hypothyroidism, obstructive sleep apnea, prior TIA, fibromyalgia, and GERD who is followed by Dr. Stanford [name] and presents today for follow-up of atrial fibrillation.  Patient is followed primarily for paroxysmal atrial fibrillation. Prior Myoview in 2016 was low risk with no evidence of ischemia. Patient has worn multiple monitors over the last few years (most recently in 05/2020) which have showed PACs, PVCs, brief episodes of NSVT, PAT, and paroxysmal atrial fibrillation. Anticoagulation was temporarily stopped due to recurrent SDH in 02/2018 and again in 02/2020 following falls. However, this was ultimately restarted given prior TIAs and he has tolerated this well.  Patient was last seen by Dr. Stanford [name] on 04/23/2021 at which time he was noted to be in rate controlled atrial fibrillation but was asymptomatic with this. Long discussion was had with patient regarding rate control vs rhythm control. Given patient was asymptomatic, decision was made to focus on rate control with plans for antiarrhythmic followed by cardioversion if he becomes symptomatic. Echo was ordered and showed LVEF of 60-65% with normal wall motion and normal diastolic parameters, normal RV, and mild to moderate MR.   Patient presents today for follow-up.  Here alone.  Patient states he has been feeling more fatigued since his last visit.  He states he just feels "out of it" and states he is having trouble concentrating and accomplishing  things.  Again he states all this is worse since his last visit.  He firmly believes this is due to his atrial fibrillation.  He denies any palpitations, lightheadedness, dizziness, near syncope/syncope.  He has an Apple watch and his heart rates are normally in the 60s.  Heart rates will occasionally be in the 90s to 110s but do not stay there for very long.  His heart rate was briefly in the 150s yesterday but did not stand there for long (very rare that his heart rates are this high).  Patient report occasional episodes of sharp very brief chest pain that lasts for couple seconds at a time and then resolves spontaneously.  He states he also has a sensation elsewhere in his body.  This is not angina.  He denies any shortness of breath, orthopnea, lower extremity edema.  He denies any falls since last visit.  No abnormal bleeding in urine or stools.  He does report intermittent loose stools/diarrhea as well.  Past Medical History:  Diagnosis Date   Acute encephalopathy 05/08/2019   Allergic rhinitis, seasonal 12/04/2011   Arthritis of hand 11/08/2020   BPH (benign prostatic hyperplasia)    Bronchitis    Cataract    Chronic anticoagulation 12/21/2017   CHADS VASC=4, Eliquis stopped Sept 2021- recurrent falls with SDH   Chronic atrial fibrillation    On Eliquis   Chronic pain, legs and back 03/10/2017   Preferred pain management Leg and back pain   Compression fracture of thoracic vertebra 09/21/2017   COPD (chronic obstructive pulmonary disease)    2 liters O2 HS   Degeneration of thoracic intervertebral disc 09/21/2017  Degenerative joint disease of hand 07/14/2017   Dupuytren's disease of palm 07/12/2019   Essential hypertension 12/04/2011   Fatty tumor    waste and back   Fibromyalgia    Generalized anxiety disorder 06/12/2020   12/21 He is reporting severe insomnia and anxiety symptoms.  Options are limited.  We will try a low-dose lorazepam at night and during the day for anxiety and  panic attacks.  He will stop lorazepam if problems.  He will stop drinking alcohol.  Discontinue BuSpar.  Reduce Cymbalta to 1 a day.   GERD (gastroesophageal reflux disease)    Glaucoma 03/08/2016   Gout 03/08/2016   Hereditary and idiopathic peripheral neuropathy 08/28/2015   History of COVID-19 05/08/2019   2021 Post-COVID sx's -" brain fog" Try Lion's mane supplement and B complex with niacin for neuropathy   Hyperlipidemia 03/10/2017   Hypothyroidism    Hypoxia    Insomnia    Leukocytosis 03/14/2020   Major depressive disorder 09/07/2016   Obstructive sleep apnea    Osteoarthritis    Osteoporosis    Recurrent falls 03/25/2020   PT. Treat neuropathy, insomnia.  Reduce Cymbalta to 1 a day.  Discontinue BuSpar.   Rotator cuff arthropathy of left shoulder 07/21/2019   SDH (subdural hematoma) 03/21/2020   Recurrent SDH secondary to falls at home- Sept 2019 and again 03/14/2020- Eliquis stopped.   Sebaceous cyst 06/27/2019   Spinal compression fracture seventh vertebre   Spondylolisthesis 09/07/2016   On fosamax - for about one year - Dr Alyson Ingles  10/14/17 dexa: normal dexa -- spine 2.1,   RFN -0.9,  LFN   -0.7   - no comparison on file, previous fracture      Thoracic back pain 08/10/2017   Thrombocytopenia 12/20/2020   TIA (transient ischemic attack) 08/28/2015   Type 2 diabetes mellitus 03/08/2016    Past Surgical History:  Procedure Laterality Date   APPENDECTOMY  age 55   [place] Right    early 2000s   CATARACT EXTRACTION     bilateral   CHOLECYSTECTOMY  age 84   EYE SURGERY     FOOT ARTHRODESIS Right 02/02/2013   Procedure: RIGHT HALLUX METATARSAL PHALANGEAL JOINT ARTHRODESIS ;  Surgeon: Wylene Simmer, MD;  Location: [facility];  Service: Orthopedics;  Laterality: Right;   INGUINAL HERNIA REPAIR  age 27   rt side   NASAL CONCHA BULLOSA RESECTION  age 66   PROSTATE SURGERY     SHOULDER ARTHROSCOPY W/ ROTATOR CUFF REPAIR Right    early 2000s    tonsil     VASECTOMY  age 67    Current Medications: Current Meds  Medication Sig   acetaminophen (TYLENOL) 650 MG CR tablet Take 650 mg by mouth in the morning and at bedtime. Pt takes 2 tabs 2 times daily   allopurinol (ZYLOPRIM) 300 MG tablet Take 1 tablet (300 mg total) by mouth every evening. For gout   amiodarone (PACERONE) 200 MG tablet Take 1 tablet (200 mg) by mouth 2 times a day for 2 weeks then take 1 tablet (200 mg) daily   atorvastatin (LIPITOR) 20 MG tablet Take 1 tablet (20 mg total) by mouth daily. For cholesterol   bimatoprost (LUMIGAN) 0.01 % SOLN Place 1 drop into both eyes at bedtime.    calcium-vitamin D (OSCAL WITH D) 500-200 MG-UNIT tablet Take 1 tablet by mouth daily with breakfast.   colchicine 0.6 MG tablet Take 0.6 mg by mouth daily as needed (flareups).  Continuous Blood Gluc Receiver (FREESTYLE LIBRE 14 DAY READER) DEVI UAD to check sugars.  E11.65   Continuous Blood Gluc Sensor (FREESTYLE LIBRE 14 DAY SENSOR) MISC UAD to check sugars, E11.65   dapagliflozin propanediol (FARXIGA) 10 MG TABS tablet Take 1 tablet (10 mg total) by mouth daily before breakfast. For diabetes   diltiazem (CARDIZEM CD) 300 MG 24 hr capsule TAKE ONE CAPSULE BY MOUTH DAILY   diltiazem (CARDIZEM) 30 MG tablet Take 1 tablet (30 mg total) by mouth 2 (two) times daily as needed (sustained elevated heart rates >100 bpm for > 20 minutes.).   ELIQUIS 5 MG TABS tablet TAKE ONE TABLET BY MOUTH TWICE A DAY   FLUoxetine (PROZAC) 40 MG capsule Take 1 capsule (40 mg total) by mouth daily. For depression   glucose blood test strip Use to check blood sugar daily. E11.9   hydrochlorothiazide (HYDRODIURIL) 12.5 MG tablet Take 1 tablet (12.5 mg total) by mouth daily.   Lancets MISC Use to check blood sugars daily. E11.9   latanoprost (XALATAN) 0.005 % ophthalmic solution 1 drop at bedtime.   levothyroxine (SYNTHROID) 112 MCG tablet TAKE ONE TABLET BY MOUTH DAILY BEFORE BREAKFAST   Melatonin 5 MG TABS  Take 10 mg by mouth at bedtime.   metFORMIN (GLUCOPHAGE-XR) 750 MG 24 hr tablet Take 1 tablet (750 mg total) by mouth in the morning and at bedtime. For diabetes   mometasone (NASONEX) 50 MCG/ACT nasal spray Place 2 sprays into the nose as needed (allergies).   Multiple Vitamin (MULTIVITAMIN) tablet Take 1 tablet by mouth daily.   nitroGLYCERIN (NITROSTAT) 0.4 MG SL tablet Place 1 tablet (0.4 mg total) under the tongue every 5 (five) minutes as needed for chest pain.   omeprazole (PRILOSEC) 20 MG capsule TAKE ONE CAPSULE BY MOUTH DAILY   oxyCODONE-acetaminophen (PERCOCET) 10-325 MG tablet Take 1 tablet by mouth every 8 (eight) hours as needed for pain.   pregabalin (LYRICA) 75 MG capsule TAKE ONE CAPSULE BY MOUTH TWICE A DAY   RESTASIS 0.05 % ophthalmic emulsion Place 1 drop into both eyes 2 (two) times daily.    Semaglutide (RYBELSUS) 7 MG TABS Take 7 mg by mouth daily. Via [employer] pt assistance   sildenafil (REVATIO) 20 MG tablet Take 40-100 mg by mouth daily as needed.   vitamin C (ASCORBIC ACID) 500 MG tablet Take 500 mg by mouth 2 (two) times daily.    VITAMIN D, CHOLECALCIFEROL, PO Take 25 mcg by mouth daily.      Allergies:   Other   Social History   Socioeconomic History   Marital status: Married    Spouse name: Not on file   Number of children: 2   Years of education: 43   Highest education level: Master's degree (e.g., MA, MS, Jonathan, MEd, MSW, MBA)  Occupational History   Occupation: RETIRED    Employer: RETIRED  Tobacco Use   Smoking status: Former    Packs/day: 2.00    Years: 10.00    Pack years: 20.00    Types: Cigarettes    Quit date: 07/18/1975    Years since quitting: 45.9   Smokeless tobacco: Never  Vaping Use   Vaping Use: Never used  Substance and Sexual Activity   Alcohol use: Yes    Comment: 1-2 drinks/day   Drug use: No   Sexual activity: Not Currently  Other Topics Concern   Not on file  Social History Narrative   Not on file   Social  Determinants  of Health   Financial Resource Strain: Not on file  Food Insecurity: Not on file  Transportation Needs: Not on file  Physical Activity: Not on file  Stress: Not on file  Social Connections: Not on file     Family History: The patient's family history includes Arthritis in his mother; Coronary artery disease in his brother; Dementia in his mother; Heart disease in his father; Kidney cancer in his father; Lung cancer in his father and mother; Prostate cancer in his father.  ROS:   Please see the history of present illness.     EKGs/Labs/Other Studies Reviewed:    The following studies were reviewed today:  Myoview 12/27/2014: Nuclear stress EF: 56%. The left ventricular ejection fraction is normal (55-65%). The study is normal. This is a low risk study. _______________  Event Monitor 02/08/2017 to 03/09/2017: Sinus with PACs, PVCs, and PAF (intermittent RVR). _______________  Elwyn Reach Monitor 12/2017: Sinus with PACs, PVCs, rare couplet and 3 beats NSVT. _______________  Elwyn Reach Monitor 05/2020: Sinus rhythm with PVCs, PACs, PAF, PAT and 4 beats NSVT. _______________  Echocardiogram 05/07/2021: Impressions:  1. Left ventricular ejection fraction, by estimation, is 60 to 65%. The  left ventricle has normal function. The left ventricle has no regional  wall motion abnormalities. Left ventricular diastolic parameters were  normal.   2. Right ventricular systolic function is normal. The right ventricular  size is normal.   3. The mitral valve is normal in structure. Mild to moderate mitral valve  regurgitation.   4. The aortic valve is normal in structure. Aortic valve regurgitation is  not visualized.  EKG:  EKG ordered today. EKG personally reviewed and demonstrates atrial fibrillation, rate 76 bpm, with PVC and non-specific ST/T changes. Normal axis. QTc 472 ms.   Recent Labs: 11/08/2020: Hemoglobin 13.3; Platelets 128 12/20/2020: TSH 2.62 02/18/2021: ALT 18; BUN 16;  Creatinine, Ser 0.92; Potassium 3.9; Sodium 139  Recent Lipid Panel    Component Value Date/Time   CHOL 138 11/08/2020 1624   TRIG 245 (H) 11/08/2020 1624   HDL 42 11/08/2020 1624   CHOLHDL 3.3 11/08/2020 1624   VLDL 42.2 (H) 08/14/2019 1028   LDLCALC 65 11/08/2020 1624   LDLDIRECT 51.0 08/14/2019 1028    Physical Exam:    Vital Signs: BP 126/60    Pulse 76    Ht 5' 6.5" (1.689 m)    Wt 179 lb 9.6 oz (81.5 kg)    SpO2 96%    BMI 28.55 kg/m     Wt Readings from Last 3 Encounters:  06/17/21 179 lb 9.6 oz (81.5 kg)  04/23/21 184 lb 9.6 oz (83.7 kg)  04/07/21 190 lb (86.2 kg)     General: 87 y.o. Caucasian male in no acute distress. HEENT: Normocephalic and atraumatic. Sclera clear. EOMs intact. Neck: Supple. No JVD. Heart: Irregularly irregular rhythm with normal rate. Distinct S1 and S2. Very soft murmur noted at apex. No gallops or rubs. Radial and pulses 2+ and equal bilaterally. Lungs: No increased work of breathing. Clear to ausculation bilaterally. No wheezes, rhonchi, or rales.  Abdomen: Soft, non-distended, and non-tender to palpation.  Extremities: No lower extremity edema.    Skin: Warm and dry. Neuro: Alert and oriented x3. No focal deficits. Psych: Normal affect. Responds appropriately.  Assessment:    1. Chronic atrial fibrillation ([place])   2. Mitral valve insufficiency, unspecified etiology   3. Mixed hyperlipidemia   4. Type 2 diabetes mellitus without complication, without long-term current use of insulin ([place])  5. Obstructive sleep apnea   6. Fatigue, unspecified type   7. Medication management   8. Preprocedural examination     Plan:    Chronic Atrial Fibrillation History of paroxysmal atrial fibrillation. Noted on multiple monitors in the past. Plan is for rate control as long as patient remains asymptomatic.  Patient presents today in rate controlled atrial fibrillation.  He reports worsening fatigue since his last visit which he firmly believes is  due to his atrial fibrillation.  Otherwise, asymptomatic. - Continue Cardizem CD 300mg  daily. Also has short acting Cardizem 30mg  to take as needed for palpitations/tachycardia. - CHA2DS2-VASc = 6 (HTN DM, TIA x2, age x2). Continue Eliquis 5mg  twice daily. Of note, patient does have a history of SDH due to falls but is tolerating anticoagulation well. Denies any recent falls.  - We had a long discussion about continued rate control versus antiarrhythmic therapy followed by cardioversion as recommended at last visit with Dr. Stanford [name].  Patient would like to attempt to restore sinus rhythm. Therefore, will load patient with Amiodarone and then proceed with DCCV. Will start Amiodarone 200 mg twice daily for 2 weeks at which time he can decrease to 200 mg daily.  We will arrange DCCV in about 3 weeks after Amiodarone load.  Patient denies missing any doses of his Eliquis.  Discussed the importance of uninterrupted anticoagulation for and after cardioversion. Will check CBC, CMET, and TSH.  Shared Decision Making/Informed Consent The risks (stroke, cardiac arrhythmias rarely resulting in the need for a temporary or permanent pacemaker, skin irritation or burns and complications associated with conscious sedation including aspiration, arrhythmia, respiratory failure and death), benefits (restoration of normal sinus rhythm) and alternatives of a direct current cardioversion were explained in detail to Mr. Corales and he agrees to proceed.   Mitral Regurgitation Mild to moderate MR noted on recent Echo in 04/2021. - Will continue routine monitoring. Recommend repeat Echo in 04/2023 or sooner if he develops concerning symptoms.   Hypertension BP well controlled. - Continue current medications: HCTZ 12.5mg  daily and Cardizem CD 300mg  daily.   Hyperlipidemia Lipid panel from 10/2020: Total Cholesterol 128, Triglycerides 245, HDL 42, LDL 65. - Continue Lipitor 20mg  daily.  - Labs followed by PCP.  Type 2  Diabetes Mellitus Hemoglobin A1c 6.6 in 02/2021. - On Metformin, Semaglutide, and Farxiga. - Managed by PCP.  Obstructive Sleep Apnea Compliant with CPAP. Continue.  Fatigue Patient reports worsening fatigue as well as trouble concentrating and trouble accomplishing things at times.  I am not convinced tha that his fatigue is necessarily due to his atrial fibrillation; however, patient would really like to try restoring sinus rhythm.   - Plan DCCV after amiodarone load as described above.   - We will check TSH and CBC prior to cardioversion as well.   - Also recommended discussing symptoms with PCP.   Disposition: Follow up in about 6 weeks after cardioversion.   Medication Adjustments/Labs and Tests Ordered: Current medicines are reviewed at length with the patient today.  Concerns regarding medicines are outlined above.  Orders Placed This Encounter  Procedures   CBC   TSH   Comprehensive metabolic panel   EKG [id number]   Meds ordered this encounter  Medications   amiodarone (PACERONE) 200 MG tablet    Sig: Take 1 tablet (200 mg) by mouth 2 times a day for 2 weeks then take 1 tablet (200 mg) daily    Dispense:  45 tablet    Refill:  2  Patient Instructions  Medication Instructions:  START Amiodarone 200 mg 2 times a day for 2 weeks then week 3 going forward take 200 mg daily *If you need a refill on your cardiac medications before your next appointment, please call your pharmacy*  Lab Work: Your physician recommends that you return for lab work on 07/02/21:  CMET CBC TSH If you have labs (blood work) drawn today and your tests are completely normal, you will receive your results only by: [facility] (if you have MyChart) OR A paper copy in the mail If you have any lab test that is abnormal or we need to change your treatment, we will call you to review the results.  Testing/Procedures: Your physician has recommended that you have a Cardioversion (DCCV).  Electrical Cardioversion uses a jolt of electricity to your heart either through paddles or wired patches attached to your chest. This is a controlled, usually prescheduled, procedure. Defibrillation is done under light anesthesia in the hospital, and you usually go home the day of the procedure. This is done to get your heart back into a normal rhythm. You are not awake for the procedure. Please see the instruction sheet given to you today.  Scheduled for 07/09/21 with Dr. Rosaland Lao at 9:00 AM  Follow-Up: At [facility], you and your health needs are our priority.  As part of our continuing mission to provide you with exceptional heart care, we have created designated Provider Care Teams.  These Care Teams include your primary Cardiologist (physician) and Advanced Practice Providers (APPs -  Physician Assistants and Nurse Practitioners) who all work together to provide you with the care you need, when you need it.  Your next appointment:   5-6 week(s)  The format for your next appointment:   In Person  Provider:   Kirk Ruths, MD  or  APP        Other Instructions   You are scheduled for a TEE/Cardioversion/TEE Cardioversion on 07/09/21 with Dr. Johney Frame.  Please arrive at the [facility] (Main Entrance A) at [facility]: [address] [place], [place] [address] at 8:00 AM.  DIET: Nothing to eat or drink after midnight except a sip of water with medications (see medication instructions below)  FYI: For your safety, and to allow [place] to monitor your vital signs accurately during the surgery/procedure we request that   if you have artificial nails, gel coating, SNS etc. Please have those removed prior to your surgery/procedure. Not having the nail coverings /polish removed may result in cancellation or delay of your surgery/procedure.   Medication Instructions: Hold Hydrochlorothiazide (HCTZ) the morning of Cardioversion  Hold Metformin, Farxiga, Semaglutide the  morning of the Cardioversion    Continue your anticoagulant: Apixaban (Eliquis) You will need to continue your anticoagulant after your procedure until you  are told by your  Provider that it is safe to stop   Labs: If patient is on Coumadin, patient needs pt/INR, CBC, BMET within 3 days (No pt/INR needed for patients taking Xarelto, Eliquis, Pradaxa) For patients receiving anesthesia for TEE and all Cardioversion patients: BMET, CBC within 1 week  Come to: [facility] between the hours of 8:00 am and 4:30 pm. You do not have to be fasting.  You must have a responsible person to drive you home and stay in the waiting area during your procedure. Failure to do so could result in cancellation.  Bring your insurance cards.  *Special Note: Every effort is made  to have your procedure done on time. Occasionally there are emergencies that occur at the hospital that may cause delays. Please be patient if a delay does occur.     Signed, Darreld Mclean, PA-C  06/17/2021 9:49 AM    [facility]

## 2021-06-10 ENCOUNTER — Encounter: Payer: Self-pay | Admitting: Physical Therapy

## 2021-06-10 ENCOUNTER — Other Ambulatory Visit: Payer: Self-pay

## 2021-06-10 ENCOUNTER — Ambulatory Visit: Payer: Medicare Other | Admitting: Physical Therapy

## 2021-06-10 DIAGNOSIS — M6281 Muscle weakness (generalized): Secondary | ICD-10-CM | POA: Diagnosis not present

## 2021-06-10 DIAGNOSIS — R4701 Aphasia: Secondary | ICD-10-CM | POA: Diagnosis not present

## 2021-06-10 DIAGNOSIS — R2689 Other abnormalities of gait and mobility: Secondary | ICD-10-CM

## 2021-06-10 DIAGNOSIS — R41841 Cognitive communication deficit: Secondary | ICD-10-CM | POA: Diagnosis not present

## 2021-06-10 DIAGNOSIS — R2681 Unsteadiness on feet: Secondary | ICD-10-CM | POA: Diagnosis not present

## 2021-06-10 NOTE — Therapy (Signed)
[facility] [address], [place], [name], [address] Phone: [phone]   Fax:  [phone]  Physical Therapy Treatment/Recert/Progress Note  Patient Details  Name: Jonathan [name] MRN: [id number] Date of Birth: Aug 26, 1932 Referring Provider (PT): Lazaro Arms, NP  Covering dates 04/21/2021-06/10/2021-see clinical impression statement for details.  Encounter Date: 06/10/2021   PT End of Session - 06/10/21 1109     Visit Number 9    Number of Visits 15    Date for PT Re-Evaluation 07/25/21    Authorization Type Medicare/BCBS    PT Start Time 1106    PT Stop Time 1144    PT Time Calculation (min) 38 min    Equipment Utilized During Treatment Gait belt    Activity Tolerance Patient tolerated treatment well    Behavior During Therapy WFL for tasks assessed/performed             Past Medical History:  Diagnosis Date   Acute encephalopathy 05/08/2019   Allergic rhinitis, seasonal 12/04/2011   Arthritis of hand 11/08/2020   BPH (benign prostatic hyperplasia)    Bronchitis    Cataract    Chronic anticoagulation 12/21/2017   CHADS VASC=4, Eliquis stopped Sept 2021- recurrent falls with SDH   Chronic atrial fibrillation    On Eliquis   Chronic pain, legs and back 03/10/2017   Preferred pain management Leg and back pain   Compression fracture of thoracic vertebra 09/21/2017   COPD (chronic obstructive pulmonary disease)    2 liters O2 HS   Degeneration of thoracic intervertebral disc 09/21/2017   Degenerative joint disease of hand 07/14/2017   Dupuytren's disease of palm 07/12/2019   Essential hypertension 12/04/2011   Fatty tumor    waste and back   Fibromyalgia    Generalized anxiety disorder 06/12/2020   12/21 He is reporting severe insomnia and anxiety symptoms.  Options are limited.  We will try a low-dose lorazepam at night and during the day for anxiety and panic attacks.  He will stop lorazepam if problems.  He  will stop drinking alcohol.  Discontinue BuSpar.  Reduce Cymbalta to 1 a day.   GERD (gastroesophageal reflux disease)    Glaucoma 03/08/2016   Gout 03/08/2016   Hereditary and idiopathic peripheral neuropathy 08/28/2015   History of COVID-19 05/08/2019   2021 Post-COVID sx's -" brain fog" Try Lion's mane supplement and B complex with niacin for neuropathy   Hyperlipidemia 03/10/2017   Hypothyroidism    Hypoxia    Insomnia    Leukocytosis 03/14/2020   Major depressive disorder 09/07/2016   Obstructive sleep apnea    Osteoarthritis    Osteoporosis    Recurrent falls 03/25/2020   PT. Treat neuropathy, insomnia.  Reduce Cymbalta to 1 a day.  Discontinue BuSpar.   Rotator cuff arthropathy of left shoulder 07/21/2019   SDH (subdural hematoma) 03/21/2020   Recurrent SDH secondary to falls at home- Sept 2019 and again 03/14/2020- Eliquis stopped.   Sebaceous cyst 06/27/2019   Spinal compression fracture seventh vertebre   Spondylolisthesis 09/07/2016   On fosamax - for about one year - Dr Alyson Ingles  10/14/17 dexa: normal dexa -- spine 2.1,   RFN -0.9,  LFN   -0.7   - no comparison on file, previous fracture      Thoracic back pain 08/10/2017   Thrombocytopenia 12/20/2020   TIA (transient ischemic attack) 08/28/2015   Type 2 diabetes mellitus 03/08/2016    Past Surgical History:  Procedure Laterality Date  APPENDECTOMY  age 26   CARPAL TUNNEL RELEASE Right    early 2000s   CATARACT EXTRACTION     bilateral   CHOLECYSTECTOMY  age 41   EYE SURGERY     FOOT ARTHRODESIS Right 02/02/2013   Procedure: RIGHT HALLUX METATARSAL PHALANGEAL JOINT ARTHRODESIS ;  Surgeon: Wylene Simmer, MD;  Location: [facility];  Service: Orthopedics;  Laterality: Right;   INGUINAL HERNIA REPAIR  age 11   rt side   NASAL CONCHA BULLOSA RESECTION  age 53   PROSTATE SURGERY     SHOULDER ARTHROSCOPY W/ ROTATOR CUFF REPAIR Right    early 2000s   tonsil     VASECTOMY  age 53    There were no  vitals filed for this visit.   Subjective Assessment - 06/10/21 1108     Subjective Feel like I've made progress, but feel there is a lot I still need to work on, especially balance.  Sometimes it's stumbling, or sometimes it is veering to the right.  Have not had any falls.    Pertinent History PMH: pAF on Eliquis (to see cardiologist on 04/23/21), chronic pain on oxycodone and Ambien, previous SDH, COPD on home O2 noct, HTN, hypothyroidism, OSA on CPAP, and DM    Patient Stated Goals Pt's goal for therapy is to improve balance, strengthening, and walking.    Currently in Pain? Yes    Pain Score 2     Pain Location Generalized    Pain Descriptors / Indicators Aching    Pain Type Chronic pain    Pain Onset --   joint pain all over   Aggravating Factors  general pain, cold weather    Pain Relieving Factors heating pad                [facility] PT Assessment - 06/10/21 0001       6 Minute Walk- Baseline   6 Minute Walk- Baseline yes    BP (mmHg) 145/69    HR (bpm) 61    02 Sat (%RA) 98 %    Modified Borg Scale for Dyspnea 0- Nothing at all    Perceived Rate of Exertion (Borg) 6-      6 Minute walk- Post Test   6 Minute Walk Post Test yes    BP (mmHg) 148/70    HR (bpm) 66    02 Sat (%RA) 93 %    Modified Borg Scale for Dyspnea 1- Very mild shortness of breath    Perceived Rate of Exertion (Borg) 9- very light      6 minute walk test results    Endurance additional comments 1206      Dynamic Gait Index   Level Surface Normal    Change in Gait Speed Normal    Gait with Horizontal Head Turns Mild Impairment    Gait with Vertical Head Turns Normal    Gait and Pivot Turn Normal    Step Over Obstacle Mild Impairment    Step Around Obstacles Normal    Steps Mild Impairment    Total Score 21    DGI comment: Improved from 15/24      Functional Gait  Assessment   Gait assessed  Yes    Gait Level Surface Walks 20 ft in less than 7 sec but greater than 5.5 sec, uses assistive  device, slower speed, mild gait deviations, or deviates 6-10 in outside of the 12 in walkway width.   6.09   Change in  Gait Speed Able to smoothly change walking speed without loss of balance or gait deviation. Deviate no more than 6 in outside of the 12 in walkway width.    Gait with Horizontal Head Turns Performs head turns smoothly with slight change in gait velocity (eg, minor disruption to smooth gait path), deviates 6-10 in outside 12 in walkway width, or uses an assistive device.   7.19   Gait with Vertical Head Turns Performs head turns with no change in gait. Deviates no more than 6 in outside 12 in walkway width.    Gait and Pivot Turn Pivot turns safely within 3 sec and stops quickly with no loss of balance.    Step Over Obstacle Is able to step over 2 stacked shoe boxes taped together (9 in total height) without changing gait speed. No evidence of imbalance.    Gait with Narrow Base of Support Is able to ambulate for 10 steps heel to toe with no staggering.    Gait with Eyes Closed Cannot walk 20 ft without assistance, severe gait deviations or imbalance, deviates greater than 15 in outside 12 in walkway width or will not attempt task.   veered to left   Ambulating Backwards Cannot walk 20 ft without assistance, severe gait deviations or imbalance, deviates greater than 15 in outside 12 in walkway width or will not attempt task.   decreased foot clearance, near stumble   Steps Alternating feet, must use rail.    Total Score 21    FGA comment: Scores <22/30 indicates increased fall risk.                           Jonathan [name] Adult PT Treatment/Exercise - 06/10/21 0001       High Level Balance   High Level Balance Comments Standing on solid surface EO and EC x 30 seconds; then EO and EC on foam surface x 30 seconds.  Increased posterior sway with EC on foam last 3 seconds.      Self-Care   Self-Care Other Self-Care Comments    Other Self-Care Comments  Discussed fall  prevention in regards to lowly lit areas, needing extra light; discussed progress towards goals, POC; discussed HEP updates last session, and encouraged pt to continue to perform them daily.                     PT Education - 06/10/21 1150     Education Details Progress towards goals, POC; fall prevention discussion; importance of consistent HEP performance    Person(s) Educated Patient    Methods Explanation    Comprehension Verbalized understanding              PT Short Term Goals - 05/13/21 1338       PT SHORT TERM GOAL #1   Title Pt will be independent with HEP for improved strength, balance, transfers, and gait.  TARGET 05/16/2021    Time 4    Period Weeks    Status Achieved      PT SHORT TERM GOAL #2   Title Pt will improve 5x sit<>stand to less than or equal to 11.5 sec to demonstrate improved functional strength and transfer efficiency.    Baseline 12.38 sec with decreased forward lean to initiate; 05/13/21:  15.47 sec, 2nd trial 12.38 sec    Time 4    Period Weeks    Status Not Met      PT SHORT TERM  GOAL #3   Title 6MWT to be assessed and goal written as appropriate.    Baseline initial 983 ft, 05/13/21 1267 ft (LTG is written and remains appropriate)    Time 4    Period Weeks    Status Achieved               PT Long Term Goals - 06/10/21 1123       PT LONG TERM GOAL #1   Title Pt will be independent with HEP for improved strength, balance, transfers, and gait.  TARGET 06/13/2021    Time 8    Period Weeks    Status On-going      PT LONG TERM GOAL #2   Title Pt will improve DGI score to at least 19/24 to decrease fall risk.    Baseline 15/24; 21/24 06/10/2021    Time 8    Status Achieved      PT LONG TERM GOAL #3   Title Patient will demo ability to complete situation 4 of M-CTSIB for >/= 15 seconds to improve balance.    Baseline 30 seconds 06/10/2021    Time 8    Period Weeks    Status Achieved      PT LONG TERM GOAL #4    Title Pt will verbalize plans for continued community fitness upon d/c from PT to maximize gains made in therapy.    Time 8    Period Weeks    Status On-going      PT LONG TERM GOAL #5   Title Patient to complete 472mon 6 minute walk test with LRAD. (>1300 ft)    Baseline 1206 ft 06/10/2021    Time 8    Period Weeks    Status Not Met            New goals for recert:   PT Short Term Goals - 06/10/21 1210       PT SHORT TERM GOAL #1   Title Pt will be independent with progression of HEP for improved strength, balance, transfers, and gait.  TARGET 07/11/2021    Time 4    Period Weeks    Status Revised      PT SHORT TERM GOAL #2   Title Pt will improve 5x sit<>stand to less than or equal to 11.5 sec to demonstrate improved functional strength and transfer efficiency.    Baseline 12.38 sec with decreased forward lean to initiate; 05/13/21:  15.47 sec, 2nd trial 12.38 sec    Time 4    Period Weeks    Status On-going             PT Long Term Goals - 06/10/21 1212       PT LONG TERM GOAL #1   Title Pt will be independent with final HEP for improved strength, balance, transfers, and gait.  TARGET 07/25/2021    Time 6    Period Weeks    Status On-going      PT LONG TERM GOAL #2   Title Pt will improve FGA score to at least 24/30 to decrease fall risk.    Baseline 15/24; 21/24 06/10/2021    Time 6    Status Revised      PT LONG TERM GOAL #3   Title Patient to complete 4061mn 6 minute walk test with LRAD. (>1300 ft)    Baseline 1206 ft    Time 6    Period Weeks    Status Revised  PT LONG TERM GOAL #4   Title Pt will verbalize plans for continued community fitness upon d/c from PT to maximize gains made in therapy.    Time 6    Period Weeks    Status On-going                   Plan - 06/10/21 1201     Clinical Impression Statement Assessed LTGs this visit, with pt meeting 2 of 5 LTGs.  LTG 1 for HEP ongoing, LTG 4 for community fitness ongoing.   LTG 2 and 3 met for improved DGI and standing on foam for improved balance.  Assessed FGA score today with score 21/30 indicating increased fall risk.  LTG 5 not met for 6 minute walk test, with pt improving distance by >200 ft from baseline; however, not to goal level.  Pt demonstrates decreased foot clearance and [place] pattern with progression of time on 6 MWT.  Pt is making progress overall, but would benefit from skilled PT to further address balance, functional hip strength and gait for improved mobility and decreased fall risk.    Comorbidities PMH: pAF on Eliquis, chronic pain on oxycodone and Ambien, previous SDH, COPD on home O2 noct, HTN, hypothyroidism, OSA on CPAP, and DM, peripheral neuropathy    PT Frequency 1x / week    PT Duration 8 weeks    PT Treatment/Interventions ADLs/Self Care Home Management;Gait training;Functional mobility training;Therapeutic activities;Therapeutic exercise;Balance training;Neuromuscular re-education;Patient/family education    PT Next Visit Plan Recert completed 97/74/14 visit.  Need to make additions/upgrades to HEP for compliant surface/vestibular system use for balance as well as ankle/hip/step strategy as needed; vestibular training for balance, lateral weigthshifting and hip strengthening, gait with head turns/nods and compliant surfaces.  Try resisted gait and side step activities for balance recovery    Consulted and Agree with Plan of Care Patient             Patient will benefit from skilled therapeutic intervention in order to improve the following deficits and impairments:  Abnormal gait, Difficulty walking, Decreased balance, Decreased mobility, Decreased strength  Visit Diagnosis: Unsteadiness on feet  Muscle weakness (generalized)  Other abnormalities of gait and mobility     Problem List Patient Active Problem List   Diagnosis Date Noted   Aortic atherosclerosis ([place]) 02/04/2021   Mild cognitive impairment 01/04/2021    Fatty liver 12/29/2020   Thrombocytopenia 12/20/2020   Urinary frequency 12/20/2020   Arthritis of hand 11/08/2020   Generalized anxiety disorder 06/12/2020   Recurrent falls 03/25/2020   History of SDH 03/21/2020   Leukocytosis 03/14/2020   Rotator cuff arthropathy of left shoulder 07/21/2019   Dupuytren's disease of palm 07/12/2019   Sebaceous cyst 06/27/2019   History of COVID-19 05/08/2019   Obstructive sleep apnea    Asymmetrical left sensorineural hearing loss 07/12/2018   Chronic anticoagulation 12/21/2017   Degeneration of thoracic intervertebral disc 09/21/2017   Thoracic back pain 08/10/2017   Degenerative joint disease of hand 07/14/2017   Osteoarthritis    Fibromyalgia    Fatty tumor    BPH (benign prostatic hyperplasia)    Chronic atrial fibrillation    Insomnia 03/10/2017   Chronic pain, legs and back 03/10/2017   Hyperlipidemia 03/10/2017   Obesity (BMI 30.0-34.9) 12/08/2016   Major depressive disorder 09/07/2016   Spondylolisthesis 09/07/2016   Hypothyroidism 03/08/2016   GERD (gastroesophageal reflux disease) 03/08/2016   Gout 03/08/2016   Glaucoma 03/08/2016   Type 2 diabetes mellitus 03/08/2016  Hereditary and idiopathic peripheral neuropathy 08/28/2015   History of TIA 08/28/2015   Essential hypertension 12/04/2011   Allergic rhinitis, seasonal 12/04/2011   COPD (chronic obstructive pulmonary disease)  07/18/2011    Kartel Wolbert W., PT 06/10/2021, 12:08 PM  [facility] [address], [place], [name], [address] Phone: [phone]   Fax:  [phone]  Name: Jonathan [name] MRN: [id number] Date of Birth: Dec 25, 1932

## 2021-06-11 DIAGNOSIS — M47817 Spondylosis without myelopathy or radiculopathy, lumbosacral region: Secondary | ICD-10-CM | POA: Diagnosis not present

## 2021-06-12 ENCOUNTER — Other Ambulatory Visit: Payer: Self-pay

## 2021-06-12 ENCOUNTER — Ambulatory Visit: Payer: Medicare Other

## 2021-06-12 DIAGNOSIS — R4701 Aphasia: Secondary | ICD-10-CM

## 2021-06-12 DIAGNOSIS — R2689 Other abnormalities of gait and mobility: Secondary | ICD-10-CM | POA: Diagnosis not present

## 2021-06-12 DIAGNOSIS — R41841 Cognitive communication deficit: Secondary | ICD-10-CM | POA: Diagnosis not present

## 2021-06-12 DIAGNOSIS — M6281 Muscle weakness (generalized): Secondary | ICD-10-CM | POA: Diagnosis not present

## 2021-06-12 DIAGNOSIS — R2681 Unsteadiness on feet: Secondary | ICD-10-CM | POA: Diagnosis not present

## 2021-06-12 NOTE — Therapy (Signed)
[facility] [address], [place], [name], [address] Phone: [phone]   Fax:  [phone]  Speech Language Pathology Treatment  Patient Details  Name: Jonathan [name] MRN: [id number] Date of Birth: [birth year] Referring Provider (SLP): Lazaro Arms, NP   Encounter Date: 06/12/2021   End of Session - 06/12/21 1321     Visit Number 6    Number of Visits 9    Date for SLP Re-Evaluation 06/27/21    SLP Start Time 0933    SLP Stop Time  7902    SLP Time Calculation (min) 42 min    Activity Tolerance Patient tolerated treatment well             Past Medical History:  Diagnosis Date   Acute encephalopathy 05/08/2019   Allergic rhinitis, seasonal 12/04/2011   Arthritis of hand 11/08/2020   BPH (benign prostatic hyperplasia)    Bronchitis    Cataract    Chronic anticoagulation 12/21/2017   CHADS VASC=4, Eliquis stopped Sept 2021- recurrent falls with SDH   Chronic atrial fibrillation    On Eliquis   Chronic pain, legs and back 03/10/2017   Preferred pain management Leg and back pain   Compression fracture of thoracic vertebra 09/21/2017   COPD (chronic obstructive pulmonary disease)    2 liters O2 HS   Degeneration of thoracic intervertebral disc 09/21/2017   Degenerative joint disease of hand 07/14/2017   Dupuytren's disease of palm 07/12/2019   Essential hypertension 12/04/2011   Fatty tumor    waste and back   Fibromyalgia    Generalized anxiety disorder 06/12/2020   12/21 He is reporting severe insomnia and anxiety symptoms.  Options are limited.  We will try a low-dose lorazepam at night and during the day for anxiety and panic attacks.  He will stop lorazepam if problems.  He will stop drinking alcohol.  Discontinue BuSpar.  Reduce Cymbalta to 1 a day.   GERD (gastroesophageal reflux disease)    Glaucoma 03/08/2016   Gout 03/08/2016   Hereditary and idiopathic peripheral neuropathy 08/28/2015   History of  COVID-19 05/08/2019   2021 Post-COVID sx's -" brain fog" Try Lion's mane supplement and B complex with niacin for neuropathy   Hyperlipidemia 03/10/2017   Hypothyroidism    Hypoxia    Insomnia    Leukocytosis 03/14/2020   Major depressive disorder 09/07/2016   Obstructive sleep apnea    Osteoarthritis    Osteoporosis    Recurrent falls 03/25/2020   PT. Treat neuropathy, insomnia.  Reduce Cymbalta to 1 a day.  Discontinue BuSpar.   Rotator cuff arthropathy of left shoulder 07/21/2019   SDH (subdural hematoma) 03/21/2020   Recurrent SDH secondary to falls at home- Sept 2019 and again 03/14/2020- Eliquis stopped.   Sebaceous cyst 06/27/2019   Spinal compression fracture seventh vertebre   Spondylolisthesis 09/07/2016   On fosamax - for about one year - Dr Alyson Ingles  10/14/17 dexa: normal dexa -- spine 2.1,   RFN -0.9,  LFN   -0.7   - no comparison on file, previous fracture      Thoracic back pain 08/10/2017   Thrombocytopenia 12/20/2020   TIA (transient ischemic attack) 08/28/2015   Type 2 diabetes mellitus 03/08/2016    Past Surgical History:  Procedure Laterality Date   APPENDECTOMY  age 56   CARPAL TUNNEL RELEASE Right    early 2000s   CATARACT EXTRACTION     bilateral   CHOLECYSTECTOMY  age 51  EYE SURGERY     FOOT ARTHRODESIS Right 02/02/2013   Procedure: RIGHT HALLUX METATARSAL PHALANGEAL JOINT ARTHRODESIS ;  Surgeon: Wylene Simmer, MD;  Location: [facility];  Service: Orthopedics;  Laterality: Right;   INGUINAL HERNIA REPAIR  age 52   rt side   NASAL CONCHA BULLOSA RESECTION  age 13   PROSTATE SURGERY     SHOULDER ARTHROSCOPY W/ ROTATOR CUFF REPAIR Right    early 2000s   tonsil     VASECTOMY  age 64    There were no vitals filed for this visit.   Subjective Assessment - 06/12/21 1024     Subjective "I'm another year older since I saw you last. And I had a radio frequency ablation yesterday."    Currently in Pain? Yes    Pain Score 3     Pain  Location Back    Pain Orientation Lower    Pain Descriptors / Indicators Aching    Pain Type Chronic pain    Pain Onset More than a month ago    Pain Frequency Constant                   ADULT SLP TREATMENT - 06/12/21 1027       General Information   Behavior/Cognition Alert;Cooperative;Pleasant mood      Treatment Provided   Treatment provided Cognitive-Linquistic      Cognitive-Linquistic Treatment   Treatment focused on Cognition    Skilled Treatment "I've got lists all over the place for things to do and it seems overwhelming.Marland KitchenMarland KitchenMarland KitchenI spend time reading the news but the Christmas presents are more important." Given this SLP assisted pt with strategy for time management to improve his QOL."Paper and pencil (to do list) is more handy because I see it all" SLP told pt his homework was to find all his to-do lists and consolidate onto one list, pt will put that consolidated list on the desk in the kitchen. Pt stated it would be impossible to return to paper/pencil calendar and SLP told pt it was imperative that everything is double-checked before pressing "save". Pt endorsed his wife also has diffiuclty with accuracy in that, one example, wife's cancelled appointmetns are not removed - SLP encouraged Doug to have a standard for keeping calendar for him and wife to hold to.      Assessment / Recommendations / Plan   Plan Continue with current plan of care      Progression Toward Goals   Progression toward goals Progressing toward goals              SLP Education - 06/12/21 1319     Education Details simplify to-do lists and use calendar to fit them into his day, need to schedule time    Person(s) Educated Patient    Methods Explanation    Comprehension Verbalized understanding;Need further instruction              SLP Short Term Goals - 06/12/21 1323       SLP SHORT TERM GOAL #1   Title pt will undergo cognitive linguistic testing and goals added PRN    Status  Achieved    Target Date 05/23/21      SLP SHORT TERM GOAL #2   Title pt will report using 3 compensations for attention between 2 sessions    Baseline 06-03-21    Time 4    Period Weeks    Status Not Met   and ongoing  Target Date 06/13/21              SLP Long Term Goals - 06/12/21 1325       SLP LONG TERM GOAL #1   Title pt will demo independence with both semantic feature analysis (SFA) and verb network strengthening treatment (VNeST)    Time 1    Period --   sessions   Status Not Met   and ongoing     SLP LONG TERM GOAL #2   Title pt will report that he feels more confident than prior to ST with his conversational ability    Time 1    Period --   sessions   Status Deferred      SLP LONG TERM GOAL #3   Title pt will report using 3 compensations for attention between 2 sessions    Time 6    Period Weeks    Status On-going      SLP LONG TERM GOAL #4   Title pt will catch errors made on more functional detailed tasks in 3 sessions using compensations    Time 6    Period Weeks    Status On-going      SLP LONG TERM GOAL #5   Title pt will score better on cognitive QOL measure than on 05-19-21    Time 6    Period Weeks    Status On-going              Plan - 06/12/21 1321     Clinical Impression Statement Pt complained of  inability to think of "pinata" in waiting room, and cont'd cognitive linguistic deficits. Pt described difficulty with attention to detail re: requirements of homework. He will benefit from skilled ST targeting word finding and these cognitive linguistic deficits.    Speech Therapy Frequency 1x /week   every other week   Duration --   x6 visits   Treatment/Interventions SLP instruction and feedback;Patient/family education;Compensatory techniques;Language facilitation;Multimodal communcation approach    Potential to Achieve Goals Good    SLP Home Exercise Plan semantic feature analysis    Consulted and Agree with Plan of Care Patient              Patient will benefit from skilled therapeutic intervention in order to improve the following deficits and impairments:   Cognitive communication deficit  Aphasia    Problem List Patient Active Problem List   Diagnosis Date Noted   Aortic atherosclerosis ([place]) 02/04/2021   Mild cognitive impairment 01/04/2021   Fatty liver 12/29/2020   Thrombocytopenia 12/20/2020   Urinary frequency 12/20/2020   Arthritis of hand 11/08/2020   Generalized anxiety disorder 06/12/2020   Recurrent falls 03/25/2020   History of SDH 03/21/2020   Leukocytosis 03/14/2020   Rotator cuff arthropathy of left shoulder 07/21/2019   Dupuytren's disease of palm 07/12/2019   Sebaceous cyst 06/27/2019   History of COVID-19 05/08/2019   Obstructive sleep apnea    Asymmetrical left sensorineural hearing loss 07/12/2018   Chronic anticoagulation 12/21/2017   Degeneration of thoracic intervertebral disc 09/21/2017   Thoracic back pain 08/10/2017   Degenerative joint disease of hand 07/14/2017   Osteoarthritis    Fibromyalgia    Fatty tumor    BPH (benign prostatic hyperplasia)    Chronic atrial fibrillation    Insomnia 03/10/2017   Chronic pain, legs and back 03/10/2017   Hyperlipidemia 03/10/2017   Obesity (BMI 30.0-34.9) 12/08/2016   Major depressive disorder 09/07/2016   Spondylolisthesis 09/07/2016  Hypothyroidism 03/08/2016   GERD (gastroesophageal reflux disease) 03/08/2016   Gout 03/08/2016   Glaucoma 03/08/2016   Type 2 diabetes mellitus 03/08/2016   Hereditary and idiopathic peripheral neuropathy 08/28/2015   History of TIA 08/28/2015   Essential hypertension 12/04/2011   Allergic rhinitis, seasonal 12/04/2011   COPD (chronic obstructive pulmonary disease)  07/18/2011    Garald Balding, CCC-SLP 06/12/2021, 1:26 PM  [facility] [address] [place], [name], [address] Phone: [phone]   Fax:  [phone]   Name:  Greysin Medlen MRN: [id number] Date of Birth: April 16, 1933

## 2021-06-12 NOTE — Patient Instructions (Addendum)
°  Homework :  consolidate all your to-do lists into one list and put that consolidated list on the desk in the kitchen. Call the person who helped you with your calendars and see if you can follow up with him due to still having issues with your calendars.   Every afternoon, look at your calendar for the next day and fit 1 or more of those items into your day tomorrow.   For cognitive based tasks on the computer, try  https://therapy.http://www.foster.info/ . Anything with recent memory or following directions (levels 3-4) might be helpful.

## 2021-06-17 ENCOUNTER — Ambulatory Visit (INDEPENDENT_AMBULATORY_CARE_PROVIDER_SITE_OTHER): Payer: PPO | Admitting: Student

## 2021-06-17 ENCOUNTER — Encounter: Payer: Self-pay | Admitting: Student

## 2021-06-17 ENCOUNTER — Other Ambulatory Visit: Payer: Self-pay

## 2021-06-17 ENCOUNTER — Ambulatory Visit: Payer: PPO | Admitting: Physical Therapy

## 2021-06-17 VITALS — BP 126/60 | HR 76 | Ht 66.5 in | Wt 179.6 lb

## 2021-06-17 DIAGNOSIS — E119 Type 2 diabetes mellitus without complications: Secondary | ICD-10-CM

## 2021-06-17 DIAGNOSIS — Z01818 Encounter for other preprocedural examination: Secondary | ICD-10-CM | POA: Diagnosis not present

## 2021-06-17 DIAGNOSIS — Z79899 Other long term (current) drug therapy: Secondary | ICD-10-CM | POA: Diagnosis not present

## 2021-06-17 DIAGNOSIS — I340 Nonrheumatic mitral (valve) insufficiency: Secondary | ICD-10-CM | POA: Diagnosis not present

## 2021-06-17 DIAGNOSIS — R5383 Other fatigue: Secondary | ICD-10-CM

## 2021-06-17 DIAGNOSIS — G4733 Obstructive sleep apnea (adult) (pediatric): Secondary | ICD-10-CM

## 2021-06-17 DIAGNOSIS — I482 Chronic atrial fibrillation, unspecified: Secondary | ICD-10-CM | POA: Diagnosis not present

## 2021-06-17 DIAGNOSIS — E782 Mixed hyperlipidemia: Secondary | ICD-10-CM

## 2021-06-17 MED ORDER — AMIODARONE HCL 200 MG PO TABS: ORAL_TABLET | ORAL | 2 refills | Status: DC

## 2021-06-17 NOTE — Patient Instructions (Addendum)
Medication Instructions:  START Amiodarone 200 mg 2 times a day for 2 weeks then week 3 going forward take 200 mg daily *If you need a refill on your cardiac medications before your next appointment, please call your pharmacy*  Lab Work: Your physician recommends that you return for lab work on 07/02/21:  CMET CBC TSH If you have labs (blood work) drawn today and your tests are completely normal, you will receive your results only by: [facility] (if you have MyChart) OR A paper copy in the mail If you have any lab test that is abnormal or we need to change your treatment, we will call you to review the results.  Testing/Procedures: Your physician has recommended that you have a Cardioversion (DCCV). Electrical Cardioversion uses a jolt of electricity to your heart either through paddles or wired patches attached to your chest. This is a controlled, usually prescheduled, procedure. Defibrillation is done under light anesthesia in the hospital, and you usually go home the day of the procedure. This is done to get your heart back into a normal rhythm. You are not awake for the procedure. Please see the instruction sheet given to you today.  Scheduled for 07/09/21 with Dr. Stanford Breed at 9:00 AM  Follow-Up: At [facility], you and your health needs are our priority.  As part of our continuing mission to provide you with exceptional heart care, we have created designated Provider Care Teams.  These Care Teams include your primary Cardiologist (physician) and Advanced Practice Providers (APPs -  Physician Assistants and Nurse Practitioners) who all work together to provide you with the care you need, when you need it.  Your next appointment:   5-6 week(s)  The format for your next appointment:   In Person  Provider:   Kirk Ruths, MD  or  APP        Other Instructions   You are scheduled for a TEE/Cardioversion/TEE Cardioversion on 07/10/21 with Dr. Stanford Breed.  Please arrive at the  [facility] (Main Entrance A) at [facility]: [address] [place],  [address] at 8:00 AM.  DIET: Nothing to eat or drink after midnight except a sip of water with medications (see medication instructions below)  FYI: For your safety, and to allow [place] to monitor your vital signs accurately during the surgery/procedure we request that   if you have artificial nails, gel coating, SNS etc. Please have those removed prior to your surgery/procedure. Not having the nail coverings /polish removed may result in cancellation or delay of your surgery/procedure.   Medication Instructions: Hold Hydrochlorothiazide (HCTZ) the morning of Cardioversion  Hold Metformin, Farxiga, Semaglutide the morning of the Cardioversion    Continue your anticoagulant: Apixaban (Eliquis) You will need to continue your anticoagulant after your procedure until you  are told by your  Provider that it is safe to stop   Labs: If patient is on Coumadin, patient needs pt/INR, CBC, BMET within 3 days (No pt/INR needed for patients taking Xarelto, Eliquis, Pradaxa) For patients receiving anesthesia for TEE and all Cardioversion patients: BMET, CBC within 1 week  Come to: [facility] [address] between the hours of 8:00 am and 4:30 pm. You do not have to be fasting.  You must have a responsible person to drive you home and stay in the waiting area during your procedure. Failure to do so could result in cancellation.  Bring your insurance cards.  *Special Note: Every effort is made to have your procedure done  on time. Occasionally there are emergencies that occur at the hospital that may cause delays. Please be patient if a delay does occur.

## 2021-06-24 ENCOUNTER — Ambulatory Visit: Payer: PPO | Attending: Nurse Practitioner | Admitting: Physical Therapy

## 2021-06-24 ENCOUNTER — Other Ambulatory Visit: Payer: Self-pay

## 2021-06-24 ENCOUNTER — Encounter: Payer: Self-pay | Admitting: Physical Therapy

## 2021-06-24 DIAGNOSIS — R2681 Unsteadiness on feet: Secondary | ICD-10-CM | POA: Insufficient documentation

## 2021-06-24 DIAGNOSIS — R41841 Cognitive communication deficit: Secondary | ICD-10-CM | POA: Insufficient documentation

## 2021-06-24 DIAGNOSIS — R4701 Aphasia: Secondary | ICD-10-CM | POA: Diagnosis not present

## 2021-06-24 DIAGNOSIS — R2689 Other abnormalities of gait and mobility: Secondary | ICD-10-CM | POA: Insufficient documentation

## 2021-06-24 DIAGNOSIS — M6281 Muscle weakness (generalized): Secondary | ICD-10-CM | POA: Diagnosis not present
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
## 2021-06-25 ENCOUNTER — Telehealth: Payer: Medicare Other

## 2021-06-25 NOTE — Progress Notes (Deleted)
Chronic Care Management Pharmacy Note  06/25/2021 Name:  Jonathan [name] MRN:  [id number] DOB:  1932/07/04  Summary: -Pt has been off solifenacin for ~3 months and reports urinary issues are about the same. He has f/u with urology specialist next month -Pt has been taking Rybelsus 7 mg, receiving through pt assistance -Freestyle Libre readings is often 10-20 points lower than finger stick; Counseled on interstitial glucose vs blood glucose  Recommendations/Changes made from today's visit: -Discontinue solifenacin -Updated Rybelsus dose to reflect what patient is taking -Advised pt to contact [employer] customer service for free replacement sensors   Subjective: Jonathan [name] is an 88 y.o. year old male who is a primary patient of Burns, Claudina Lick, MD.  The CCM team was consulted for assistance with disease management and care coordination needs.    Engaged with patient by telephone for follow up visit in response to provider referral for pharmacy case management and/or care coordination services.   Consent to Services:  The patient was given information about Chronic Care Management services, agreed to services, and gave verbal consent prior to initiation of services.  Please see initial visit note for detailed documentation.   Patient Care Team: Binnie Rail, MD as PCP - General (Internal Medicine) Stanford Breed Denice Bors, MD as PCP - Cardiology (Cardiology) Council Mechanic, MD as Referring Physician (Family Medicine) Earnie Larsson, MD as Consulting Physician (Neurosurgery) Lelon Perla, MD as Consulting Physician (Cardiology) Michael Boston, MD as Consulting Physician (General Surgery) Karen Kays, NP as Nurse Practitioner (Nurse Practitioner) [name], [facility] as Pharmacist (Pharmacist)  Recent office visits: 04/07/2021 - Dr. Quay Burow - no changes to medications - follow up in 3 months   Recent consult visits: 06/17/2021 - Sande Rives PA-C - Cardiology -  worsening fatigue - plan for DCCV after amiodarone load has been completed  - follow up 6 weeks after cardioversion  04/23/2021 - Dr. Stanford Breed - Cardiology - found to be in afib - plan to remain on cardizem and [facility] - follow up in 6-8 weeks   Hospital visits: None since last visit   Objective:  Lab Results  Component Value Date   CREATININE 0.92 02/18/2021   BUN 16 02/18/2021   GFR 75.75 02/18/2021   GFRNONAA >60 03/14/2020   GFRAA >60 03/14/2020   NA 139 02/18/2021   K 3.9 02/18/2021   CALCIUM 8.9 02/18/2021   CO2 30 02/18/2021    Lab Results  Component Value Date/Time   HGBA1C 6.6 (H) 02/18/2021 10:39 AM   HGBA1C 7.5 (H) 12/20/2020 11:50 AM   GFR 75.75 02/18/2021 10:39 AM   GFR 54.59 (L) 12/20/2020 11:50 AM   MICROALBUR 98.2 11/08/2020 04:24 PM   MICROALBUR 28.1 (H) 08/11/2017 08:48 AM    Last diabetic Eye exam:  Lab Results  Component Value Date/Time   HMDIABEYEEXA No Retinopathy 02/01/2019 12:00 AM    Last diabetic Foot exam: No results found for: HMDIABFOOTEX   Lab Results  Component Value Date   CHOL 138 11/08/2020   HDL 42 11/08/2020   LDLCALC 65 11/08/2020   LDLDIRECT 51.0 08/14/2019   TRIG 245 (H) 11/08/2020   CHOLHDL 3.3 11/08/2020    Hepatic Function Latest Ref Rng & Units 02/18/2021 12/20/2020 11/08/2020  Total Protein 6.0 - 8.3 g/dL 6.0 6.9 6.6  Albumin 3.5 - 5.2 g/dL 3.9 4.6 -  AST 0 - 37 U/L _0 ALT 0 - 53 U/L _1 Alk Phosphatase 39 - 117  U/L 90 95 -  Total Bilirubin 0.2 - 1.2 mg/dL 0.3 0.6 0.3    Lab Results  Component Value Date/Time   TSH 2.62 12/20/2020 11:50 AM   TSH 0.92 11/08/2020 04:24 PM    CBC Latest Ref Rng & Units 11/08/2020 06/12/2020 03/15/2020  WBC 3.8 - 10.8 Thousand/uL 13.3(H) 6.6 13.3(H)  Hemoglobin 13.2 - 17.1 g/dL 13.3 13.4 12.5(L)  Hematocrit 38.5 - 50.0 % 40.3 40.6 37.3(L)  Platelets 140 - 400 Thousand/uL 128(L) 120.0(L) 126(L)    Lab Results  Component Value Date/Time   VD25OH 58 11/08/2020 04:24 PM    VD25OH 90.58 06/12/2020 11:31 AM   VD25OH 41 11/23/2012 10:52 AM    Clinical ASCVD: Yes  The ASCVD Risk score (Arnett DK, et al., 2019) failed to calculate for the following reasons:   The 2019 ASCVD risk score is only valid for ages 15 to 84    Depression screen PHQ 2/9 04/07/2021 11/08/2020 06/12/2020  Decreased Interest 0 0 1  Down, Depressed, Hopeless 0 1 2  PHQ - 2 Score 0 1 3  Altered sleeping _0 Tired, decreased energy 1 3 0  Change in appetite 0 0 0  Feeling bad or failure about yourself  0 0 0  Trouble concentrating 1 0 0  Moving slowly or fidgety/restless 0 0 0  Suicidal thoughts 0 0 0  PHQ-9 Score _1 Difficult doing work/chores Not difficult at all Somewhat difficult -  Some recent data might be hidden     CHA2DS2-VASc Score = 6  The patient's score is based upon: CHF History: 0 HTN History: 1 Diabetes History: 1 Stroke History: 2 Vascular Disease History: 0 Age Score: 2 Gender Score: 0      Social History   Tobacco Use  Smoking Status Former   Packs/day: 2.00   Years: 10.00   Pack years: 20.00   Types: Cigarettes   Quit date: 07/18/1975   Years since quitting: 45.9  Smokeless Tobacco Never   BP Readings from Last 3 Encounters:  06/17/21 126/60  04/23/21 (!) 136/55  04/07/21 118/60   Pulse Readings from Last 3 Encounters:  06/17/21 76  04/23/21 90  04/07/21 (!) 56   Wt Readings from Last 3 Encounters:  06/17/21 179 lb 9.6 oz (81.5 kg)  04/23/21 184 lb 9.6 oz (83.7 kg)  04/07/21 190 lb (86.2 kg)   BMI Readings from Last 3 Encounters:  06/17/21 28.55 kg/m  04/23/21 29.35 kg/m  04/07/21 29.76 kg/m   Assessment/Interventions: Review of patient past medical history, allergies, medications, health status, including review of consultants reports, laboratory and other test data, was performed as part of comprehensive evaluation and provision of chronic care management services.   SDOH:  (Social Determinants of Health) assessments and  interventions performed: Yes  SDOH Screenings   Alcohol Screen: Not on file  Depression (PHQ2-9): Low Risk    PHQ-2 Score: 4  Financial Resource Strain: Not on file  Food Insecurity: Not on file  Housing: Not on file  Physical Activity: Not on file  Social Connections: Not on file  Stress: Not on file  Tobacco Use: Medium Risk   Smoking Tobacco Use: Former   Smokeless Tobacco Use: Never   Passive Exposure: Not on file  Transportation Needs: Not on file    CCM Care Plan  Allergies  Allergen Reactions   Other Other (See Comments)    DUST-Other reaction(s): Respiratory distress    Medications Reviewed Today  Reviewed by June Leap, PT (Physical Therapist) on 06/24/21 at 70  Med List Status: <None>   Medication Order Taking? Sig Documenting Provider Last Dose Status Informant  acetaminophen (TYLENOL) 650 MG CR tablet [id number] No Take 650 mg by mouth in the morning and at bedtime. Pt takes 2 tabs 2 times daily [provider] Taking Active   allopurinol (ZYLOPRIM) 300 MG tablet [id number] No Take 1 tablet (300 mg total) by mouth every evening. For gout Binnie Rail, MD Taking Active   amiodarone (PACERONE) 200 MG tablet [id number]  Take 1 tablet (200 mg) by mouth 2 times a day for 2 weeks then take 1 tablet (200 mg) daily Darreld Mclean, PA-C  Active   atorvastatin (LIPITOR) 20 MG tablet [id number] No Take 1 tablet (20 mg total) by mouth daily. For cholesterol Binnie Rail, MD Taking Active   bimatoprost (LUMIGAN) 0.01 % SOLN [id number] No Place 1 drop into both eyes at bedtime.  [provider] Taking Active Self  calcium-vitamin D (OSCAL WITH D) 500-200 MG-UNIT tablet [id number] No Take 1 tablet by mouth daily with breakfast. [provider] Taking Active Self  colchicine 0.6 MG tablet [id number] No Take 0.6 mg by mouth daily as needed (flareups).  [provider] Taking Active Self  Continuous Blood Gluc Receiver (FREESTYLE  LIBRE 14 DAY READER) [place] [id number] No UAD to check sugars.  E11.65 Binnie Rail, MD Taking Active   Continuous Blood Gluc Sensor (FREESTYLE LIBRE 14 DAY SENSOR) [place] [id number] No UAD to check sugars, E11.65 Binnie Rail, MD Taking Active   dapagliflozin propanediol (FARXIGA) 10 MG TABS tablet [id number] No Take 1 tablet (10 mg total) by mouth daily before breakfast. For diabetes Burns, Claudina Lick, MD Taking Active   diltiazem (CARDIZEM CD) 300 MG 24 hr capsule [id number] No TAKE ONE CAPSULE BY MOUTH DAILY Stanford Breed, Denice Bors, MD Taking Active   diltiazem (CARDIZEM) 30 MG tablet [id number] No Take 1 tablet (30 mg total) by mouth 2 (two) times daily as needed (sustained elevated heart rates >100 bpm for > 20 minutes.). Darreld Mclean, PA-C Taking Active   ELIQUIS 5 MG TABS tablet [id number] No TAKE ONE TABLET BY MOUTH TWICE A DAY Crenshaw, Denice Bors, MD Taking Active   FLUoxetine (PROZAC) 40 MG capsule [id number] No Take 1 capsule (40 mg total) by mouth daily. For depression Binnie Rail, MD Taking Active   glucose blood test strip [id number] No Use to check blood sugar daily. E11.9 Binnie Rail, MD Taking Active   hydrochlorothiazide (HYDRODIURIL) 12.5 MG tablet [id number] No Take 1 tablet (12.5 mg total) by mouth daily. Binnie Rail, MD Taking Active   Lancets MISC [id number] No Use to check blood sugars daily. E11.9 Binnie Rail, MD Taking Active   latanoprost (XALATAN) 0.005 % ophthalmic solution [id number] No 1 drop at bedtime. [provider] Taking Active   levothyroxine (SYNTHROID) 112 MCG tablet [id number] No TAKE ONE TABLET BY MOUTH DAILY BEFORE BREAKFAST Binnie Rail, MD Taking Active   Melatonin 5 MG TABS [id number] No Take 10 mg by mouth at bedtime. [provider] Taking Active Self  metFORMIN (GLUCOPHAGE-XR) 750 MG 24 hr tablet [id number] No Take 1 tablet (750 mg total) by mouth in the morning and at bedtime. For diabetes Binnie Rail, MD Taking Active   mometasone  (NASONEX) 50 MCG/ACT nasal spray [id number] No Place 2 sprays into the nose as needed (allergies). Binnie Rail, MD Taking Active  Multiple Vitamin (MULTIVITAMIN) tablet [id number] No Take 1 tablet by mouth daily. [provider] Taking Active Self  nitroGLYCERIN (NITROSTAT) 0.4 MG SL tablet [id number] No Place 1 tablet (0.4 mg total) under the tongue every 5 (five) minutes as needed for chest pain. Barrett, Evelene Croon, PA-C Taking Active Self           Med Note Roena Malady Apr 23, 2021  8:29 AM) Patient has if needed  omeprazole (PRILOSEC) 20 MG capsule [id number] No TAKE ONE CAPSULE BY MOUTH DAILY Burns, Claudina Lick, MD Taking Active   oxyCODONE-acetaminophen (PERCOCET) 10-325 MG tablet [id number] No Take 1 tablet by mouth every 8 (eight) hours as needed for pain. [provider] Taking Active   pregabalin (LYRICA) 75 MG capsule [id number] No TAKE ONE CAPSULE BY MOUTH TWICE A DAY Burns, Claudina Lick, MD Taking Active   RESTASIS 0.05 % ophthalmic emulsion [id number] No Place 1 drop into both eyes 2 (two) times daily.  [provider] Taking Active Self  Semaglutide (RYBELSUS) 7 MG TABS [id number] No Take 7 mg by mouth daily. Via [employer] pt assistance Binnie Rail, MD Taking Active   sildenafil (REVATIO) 20 MG tablet [id number] No Take 40-100 mg by mouth daily as needed. [provider] Taking Active Self  silodosin (RAPAFLO) 8 MG CAPS capsule [id number] No Take 8 mg by mouth daily. [provider] Taking Active   vitamin C (ASCORBIC ACID) 500 MG tablet [id number] No Take 500 mg by mouth 2 (two) times daily.  [provider] Taking Active Self  VITAMIN D, CHOLECALCIFEROL, PO [id number] No Take 25 mcg by mouth daily.  [provider] Taking Active Self            Patient Active Problem List   Diagnosis Date Noted   Aortic atherosclerosis ([place]) 02/04/2021   Mild cognitive impairment 01/04/2021   Fatty liver 12/29/2020    Thrombocytopenia 12/20/2020   Urinary frequency 12/20/2020   Arthritis of hand 11/08/2020   Generalized anxiety disorder 06/12/2020   Recurrent falls 03/25/2020   History of SDH 03/21/2020   Leukocytosis 03/14/2020   Rotator cuff arthropathy of left shoulder 07/21/2019   Dupuytren's disease of palm 07/12/2019   Sebaceous cyst 06/27/2019   History of COVID-19 05/08/2019   Obstructive sleep apnea    Asymmetrical left sensorineural hearing loss 07/12/2018   Chronic anticoagulation 12/21/2017   Degeneration of thoracic intervertebral disc 09/21/2017   Thoracic back pain 08/10/2017   Degenerative joint disease of hand 07/14/2017   Osteoarthritis    Fibromyalgia    Fatty tumor    BPH (benign prostatic hyperplasia)    Chronic atrial fibrillation    Insomnia 03/10/2017   Chronic pain, legs and back 03/10/2017   Hyperlipidemia 03/10/2017   Obesity (BMI 30.0-34.9) 12/08/2016   Major depressive disorder 09/07/2016   Spondylolisthesis 09/07/2016   Hypothyroidism 03/08/2016   GERD (gastroesophageal reflux disease) 03/08/2016   Gout 03/08/2016   Glaucoma 03/08/2016   Type 2 diabetes mellitus 03/08/2016   Hereditary and idiopathic peripheral neuropathy 08/28/2015   History of TIA 08/28/2015   Essential hypertension 12/04/2011   Allergic rhinitis, seasonal 12/04/2011   COPD (chronic obstructive pulmonary disease)  07/18/2011    Immunization History  Administered Date(s) Administered   Fluad Quad(high Dose 65+) 03/25/2020, 04/07/2021   Hepatitis A 07/25/2007, 01/22/2008   Hepatitis B 04/30/1988, 06/02/1988, 10/28/1988   IPV 05/15/1996   Influenza Split 02/19/2012, 03/13/2016   Influenza Whole 02/17/2011, 03/13/2013   Influenza,  High Dose Seasonal PF 03/10/2017, 02/15/2018, 02/09/2019   Influenza,inj,Quad PF,6+ Mos 03/15/2015   Influenza,inj,quad, With Preservative 02/14/2019   Meningococcal Polysaccharide 05/15/1996   Moderna Covid-19 Vaccine Bivalent Booster 61yr & up 02/25/2021    Moderna Sars-Covid-2 Vaccination 07/15/2019, 08/12/2019, 02/15/2020   Pneumococcal Conjugate-13 12/08/2016   Pneumococcal-Unspecified 02/01/2006   Td 01/14/1995   Tdap 03/06/2018   Zoster Recombinat (Shingrix) 01/15/2017, 01/04/2018   Zoster, Live 02/01/2006    Conditions to be addressed/monitored:  Hypertension, Hyperlipidemia, Diabetes, Atrial Fibrillation, Anxiety, Overactive Bladder, and BPH, Fibromyalgia  There are no care plans that you recently modified to display for this patient.    Medication Assistance:  Rybelsus ([employer] - approved through 06/14/21 FWilder Glade([place]) - denied July 2022 due to income too high  Compliance/Adherence/Medication fill history: Care Gaps: Dexa scan (due 10/15/19) Eye exam (due 02/01/20)  Star-Rating Drugs: Atorvastatin - LF 12/17/20 Farxiga - LF 12/20/20 x 90 ds Rybelsus - via [facility] Cares Metformin - LF 01/02/21 x 90 ds  [id number]: [phone]: [phone]  Uses pill box? Yes Pt endorses 90% compliance  We discussed: Current pharmacy is preferred with insurance plan and patient is satisfied with pharmacy services Patient decided to: Continue current medication management strategy  Care Plan and Follow Up Patient Decision:  Patient agrees to Care Plan and Follow-up.  Plan: Telephone follow up appointment with care management team member scheduled for:  3 months  ***

## 2021-07-01 ENCOUNTER — Other Ambulatory Visit: Payer: Self-pay

## 2021-07-01 ENCOUNTER — Telehealth: Payer: Self-pay

## 2021-07-01 ENCOUNTER — Encounter (HOSPITAL_COMMUNITY): Payer: Self-pay | Admitting: Cardiology

## 2021-07-01 ENCOUNTER — Encounter: Payer: Self-pay | Admitting: Physical Therapy

## 2021-07-01 ENCOUNTER — Ambulatory Visit: Payer: PPO | Admitting: Physical Therapy

## 2021-07-01 DIAGNOSIS — R2689 Other abnormalities of gait and mobility: Secondary | ICD-10-CM

## 2021-07-01 DIAGNOSIS — R2681 Unsteadiness on feet: Secondary | ICD-10-CM

## 2021-07-01 DIAGNOSIS — Z01818 Encounter for other preprocedural examination: Secondary | ICD-10-CM | POA: Diagnosis not present

## 2021-07-01 DIAGNOSIS — M6281 Muscle weakness (generalized): Secondary | ICD-10-CM

## 2021-07-01 DIAGNOSIS — Z79899 Other long term (current) drug therapy: Secondary | ICD-10-CM | POA: Diagnosis not present

## 2021-07-01 NOTE — Therapy (Signed)
[facility] [address] [place], [name], [address] Phone: [phone]   Fax:  [phone]  Physical Therapy Treatment  Patient Details  Name: Jonathan [name] MRN: [id number] Date of Birth: 04-Jan-1933 Referring Provider (PT): Lazaro Arms, NP   Encounter Date: 07/01/2021   PT End of Session - 07/01/21 1100     Visit Number 11    Number of Visits 15    Date for PT Re-Evaluation 07/25/21    Authorization Type Medicare/BCBS    Progress Note Due on Visit 19   *Progress note completed on 9th visit   PT Start Time 1017    PT Stop Time 1100    PT Time Calculation (min) 43 min    Equipment Utilized During Treatment Gait belt    Activity Tolerance Patient tolerated treatment well    Behavior During Therapy WFL for tasks assessed/performed             Past Medical History:  Diagnosis Date   Acute encephalopathy 05/08/2019   Allergic rhinitis, seasonal 12/04/2011   Arthritis of hand 11/08/2020   BPH (benign prostatic hyperplasia)    Bronchitis    Cataract    Chronic anticoagulation 12/21/2017   CHADS VASC=4, Eliquis stopped Sept 2021- recurrent falls with SDH   Chronic atrial fibrillation    On Eliquis   Chronic pain, legs and back 03/10/2017   Preferred pain management Leg and back pain   Compression fracture of thoracic vertebra 09/21/2017   COPD (chronic obstructive pulmonary disease)    2 liters O2 HS   Degeneration of thoracic intervertebral disc 09/21/2017   Degenerative joint disease of hand 07/14/2017   Dupuytren's disease of palm 07/12/2019   Essential hypertension 12/04/2011   Fatty tumor    waste and back   Fibromyalgia    Generalized anxiety disorder 06/12/2020   12/21 He is reporting severe insomnia and anxiety symptoms.  Options are limited.  We will try a low-dose lorazepam at night and during the day for anxiety and panic attacks.  He will stop lorazepam if problems.  He will stop drinking alcohol.   Discontinue BuSpar.  Reduce Cymbalta to 1 a day.   GERD (gastroesophageal reflux disease)    Glaucoma 03/08/2016   Gout 03/08/2016   Hereditary and idiopathic peripheral neuropathy 08/28/2015   History of COVID-19 05/08/2019   2021 Post-COVID sx's -" brain fog" Try Lion's mane supplement and B complex with niacin for neuropathy   Hyperlipidemia 03/10/2017   Hypothyroidism    Hypoxia    Insomnia    Leukocytosis 03/14/2020   Major depressive disorder 09/07/2016   Obstructive sleep apnea    Osteoarthritis    Osteoporosis    Recurrent falls 03/25/2020   PT. Treat neuropathy, insomnia.  Reduce Cymbalta to 1 a day.  Discontinue BuSpar.   Rotator cuff arthropathy of left shoulder 07/21/2019   SDH (subdural hematoma) 03/21/2020   Recurrent SDH secondary to falls at home- Sept 2019 and again 03/14/2020- Eliquis stopped.   Sebaceous cyst 06/27/2019   Spinal compression fracture seventh vertebre   Spondylolisthesis 09/07/2016   On fosamax - for about one year - Dr Alyson Ingles  10/14/17 dexa: normal dexa -- spine 2.1,   RFN -0.9,  LFN   -0.7   - no comparison on file, previous fracture      Thoracic back pain 08/10/2017   Thrombocytopenia 12/20/2020   TIA (transient ischemic attack) 08/28/2015   Type 2 diabetes mellitus 03/08/2016  Past Surgical History:  Procedure Laterality Date   APPENDECTOMY  age 36   CARPAL TUNNEL RELEASE Right    early 2000s   CATARACT EXTRACTION     bilateral   CHOLECYSTECTOMY  age 52   EYE SURGERY     FOOT ARTHRODESIS Right 02/02/2013   Procedure: RIGHT HALLUX METATARSAL PHALANGEAL JOINT ARTHRODESIS ;  Surgeon: Wylene Simmer, MD;  Location: [facility];  Service: Orthopedics;  Laterality: Right;   INGUINAL HERNIA REPAIR  age 43   rt side   NASAL CONCHA BULLOSA RESECTION  age 30   PROSTATE SURGERY     SHOULDER ARTHROSCOPY W/ ROTATOR CUFF REPAIR Right    early 2000s   tonsil     VASECTOMY  age 47    There were no vitals filed for this visit.    Subjective Assessment - 07/01/21 1031     Subjective reports trouble with depression and SAD lately- which he has had problems with in the past. Lacking motivation to complete tasks around the house and his HEP. Has an appointment with PCP next week to address this.    Pertinent History PMH: pAF on Eliquis (to see cardiologist on 04/23/21), chronic pain on oxycodone and Ambien, previous SDH, COPD on home O2 noct, HTN, hypothyroidism, OSA on CPAP, and DM    Patient Stated Goals Pt's goal for therapy is to improve balance, strengthening, and walking.    Currently in Pain? No/denies                               [facility] Adult PT Treatment/Exercise - 07/01/21 0001       Knee/Hip Exercises: Aerobic   Nustep Level 5, UEs/LEs  x 6 minutes for leg strength, flexibility.   pt noting abnormal     Knee/Hip Exercises: Standing   Hip Flexion Stengthening;Both;Knee bent;1 set;20 reps    Hip Flexion Limitations resisted march with yellow TB   good posture   Other Standing Knee Exercises sidestepping with red loop 4x34ft with CGA      Knee/Hip Exercises: Supine   Bridges Strengthening;Both;1 set;10 reps    Bridges Limitations straight leg bridge on pball   cues for improved control/stability     Knee/Hip Exercises: Sidelying   Hip ABduction Strengthening;Right;Left;10 reps;2 sets    Hip ABduction Limitations cues for alignment   2nd set with 2#                    PT Education - 07/01/21 1034     Education Details Spoke with patient about resuming small tasks that both him and his wife enjoy and to break chores into smaller pieces to decrease overwhelm during exaccerbation of depression; again suggested caregiver in the home or therapist to decrease caregiver burden and mental health concerns    Person(s) Educated Patient    Methods Explanation    Comprehension Verbalized understanding              PT Short Term Goals - 06/10/21 1210       PT SHORT TERM GOAL  #1   Title Pt will be independent with progression of HEP for improved strength, balance, transfers, and gait.  TARGET 07/11/2021    Time 4    Period Weeks    Status Revised      PT SHORT TERM GOAL #2   Title Pt will improve 5x sit<>stand to less than or equal to 11.5  sec to demonstrate improved functional strength and transfer efficiency.    Baseline 12.38 sec with decreased forward lean to initiate; 05/13/21:  15.47 sec, 2nd trial 12.38 sec    Time 4    Period Weeks    Status On-going               PT Long Term Goals - 06/10/21 1212       PT LONG TERM GOAL #1   Title Pt will be independent with final HEP for improved strength, balance, transfers, and gait.  TARGET 07/25/2021    Time 6    Period Weeks    Status On-going      PT LONG TERM GOAL #2   Title Pt will improve FGA score to at least 24/30 to decrease fall risk.    Baseline 15/24; 21/24 06/10/2021    Time 6    Status Revised      PT LONG TERM GOAL #3   Title Patient to complete 469m on 6 minute walk test with LRAD. (>1300 ft)    Baseline 1206 ft    Time 6    Period Weeks    Status Revised      PT LONG TERM GOAL #4   Title Pt will verbalize plans for continued community fitness upon d/c from PT to maximize gains made in therapy.    Time 6    Period Weeks    Status On-going                   Plan - 07/01/21 1101     Clinical Impression Statement Patient arrived to session with report of worsening seasonal depression recently. Reports decreased interest and motivation however has an appointment with PCP next week to address this. Reports that he continues to take his antidepressants as instructed. Spoke with patient about resuming small tasks that both him and his wife enjoy and to break chores into smaller pieces to decrease overwhelm. Patients Apple watch showed 1 spike of possible a-fib during warm up however patient asymptomatic and HR 60 bpm once checked at rest. Reviewed hip strengthening for  improved carryover; able to progress lateral hip strengthening with weighted resistance today. Patient tolerated session well and without complaints upon leaving.    Comorbidities PMH: pAF on Eliquis, chronic pain on oxycodone and Ambien, previous SDH, COPD on home O2 noct, HTN, hypothyroidism, OSA on CPAP, and DM, peripheral neuropathy    PT Frequency 1x / week    PT Duration 8 weeks    PT Treatment/Interventions ADLs/Self Care Home Management;Gait training;Functional mobility training;Therapeutic activities;Therapeutic exercise;Balance training;Neuromuscular re-education;Patient/family education    PT Next Visit Plan Need to make additions/upgrades to HEP for compliant surface/vestibular system use for balance as well as ankle/hip/step strategy as needed; vestibular training for balance, lateral weigthshifting and hip strengthening, gait with head turns/nods and compliant surfaces.  Try resisted gait and side step activities for balance recovery    Consulted and Agree with Plan of Care Patient             Patient will benefit from skilled therapeutic intervention in order to improve the following deficits and impairments:  Abnormal gait, Difficulty walking, Decreased balance, Decreased mobility, Decreased strength  Visit Diagnosis: Unsteadiness on feet  Muscle weakness (generalized)  Other abnormalities of gait and mobility     Problem List Patient Active Problem List   Diagnosis Date Noted   Aortic atherosclerosis ([place]) 02/04/2021   Mild cognitive impairment 01/04/2021   Fatty liver  12/29/2020   Thrombocytopenia 12/20/2020   Urinary frequency 12/20/2020   Arthritis of hand 11/08/2020   Generalized anxiety disorder 06/12/2020   Recurrent falls 03/25/2020   History of SDH 03/21/2020   Leukocytosis 03/14/2020   Rotator cuff arthropathy of left shoulder 07/21/2019   Dupuytren's disease of palm 07/12/2019   Sebaceous cyst 06/27/2019   History of COVID-19 05/08/2019    Obstructive sleep apnea    Asymmetrical left sensorineural hearing loss 07/12/2018   Chronic anticoagulation 12/21/2017   Degeneration of thoracic intervertebral disc 09/21/2017   Thoracic back pain 08/10/2017   Degenerative joint disease of hand 07/14/2017   Osteoarthritis    Fibromyalgia    Fatty tumor    BPH (benign prostatic hyperplasia)    Chronic atrial fibrillation    Insomnia 03/10/2017   Chronic pain, legs and back 03/10/2017   Hyperlipidemia 03/10/2017   Obesity (BMI 30.0-34.9) 12/08/2016   Major depressive disorder 09/07/2016   Spondylolisthesis 09/07/2016   Hypothyroidism 03/08/2016   GERD (gastroesophageal reflux disease) 03/08/2016   Gout 03/08/2016   Glaucoma 03/08/2016   Type 2 diabetes mellitus 03/08/2016   Hereditary and idiopathic peripheral neuropathy 08/28/2015   History of TIA 08/28/2015   Essential hypertension 12/04/2011   Allergic rhinitis, seasonal 12/04/2011   COPD (chronic obstructive pulmonary disease)  07/18/2011    Janene Harvey, PT, DPT 07/01/21 11:04 AM   [facility] [address], [place], [name], [address] Phone: [phone]   Fax:  [phone]  Name: Jamaurion Slemmer MRN: [id number] Date of Birth: 09-03-1932

## 2021-07-01 NOTE — Progress Notes (Signed)
Attempted to obtain medical history via telephone, unable to reach at this time. I left a voicemail to return pre surgical testing department's phone call.  

## 2021-07-01 NOTE — Therapy (Signed)
[facility] [address] [place], [name], [address] Phone: [phone]   Fax:  [phone]  Physical Therapy Treatment  Patient Details  Name: Jonathan [name] MRN: [id number] Date of Birth: July 10, 1932 Referring Provider (PT): Lazaro Arms, NP   Encounter Date: 07/01/2021   PT End of Session - 07/01/21 1100     Visit Number 11    Number of Visits 15    Date for PT Re-Evaluation 07/25/21    Authorization Type Medicare/BCBS    Progress Note Due on Visit 19   *Progress note completed on 9th visit   PT Start Time 1017    PT Stop Time 1100    PT Time Calculation (min) 43 min    Equipment Utilized During Treatment Gait belt    Activity Tolerance Patient tolerated treatment well    Behavior During Therapy WFL for tasks assessed/performed             Past Medical History:  Diagnosis Date   Acute encephalopathy 05/08/2019   Allergic rhinitis, seasonal 12/04/2011   Arthritis of hand 11/08/2020   BPH (benign prostatic hyperplasia)    Bronchitis    Cataract    Chronic anticoagulation 12/21/2017   CHADS VASC=4, Eliquis stopped Sept 2021- recurrent falls with SDH   Chronic atrial fibrillation    On Eliquis   Chronic pain, legs and back 03/10/2017   Preferred pain management Leg and back pain   Compression fracture of thoracic vertebra 09/21/2017   COPD (chronic obstructive pulmonary disease)    2 liters O2 HS   Degeneration of thoracic intervertebral disc 09/21/2017   Degenerative joint disease of hand 07/14/2017   Dupuytren's disease of palm 07/12/2019   Essential hypertension 12/04/2011   Fatty tumor    waste and back   Fibromyalgia    Generalized anxiety disorder 06/12/2020   12/21 He is reporting severe insomnia and anxiety symptoms.  Options are limited.  We will try a low-dose lorazepam at night and during the day for anxiety and panic attacks.  He will stop lorazepam if problems.  He will stop drinking alcohol.   Discontinue BuSpar.  Reduce Cymbalta to 1 a day.   GERD (gastroesophageal reflux disease)    Glaucoma 03/08/2016   Gout 03/08/2016   Hereditary and idiopathic peripheral neuropathy 08/28/2015   History of COVID-19 05/08/2019   2021 Post-COVID sx's -" brain fog" Try Lion's mane supplement and B complex with niacin for neuropathy   Hyperlipidemia 03/10/2017   Hypothyroidism    Hypoxia    Insomnia    Leukocytosis 03/14/2020   Major depressive disorder 09/07/2016   Obstructive sleep apnea    Osteoarthritis    Osteoporosis    Recurrent falls 03/25/2020   PT. Treat neuropathy, insomnia.  Reduce Cymbalta to 1 a day.  Discontinue BuSpar.   Rotator cuff arthropathy of left shoulder 07/21/2019   SDH (subdural hematoma) 03/21/2020   Recurrent SDH secondary to falls at home- Sept 2019 and again 03/14/2020- Eliquis stopped.   Sebaceous cyst 06/27/2019   Spinal compression fracture seventh vertebre   Spondylolisthesis 09/07/2016   On fosamax - for about one year - Dr Alyson Ingles  10/14/17 dexa: normal dexa -- spine 2.1,   RFN -0.9,  LFN   -0.7   - no comparison on file, previous fracture      Thoracic back pain 08/10/2017   Thrombocytopenia 12/20/2020   TIA (transient ischemic attack) 08/28/2015   Type 2 diabetes mellitus 03/08/2016  Past Surgical History:  Procedure Laterality Date   APPENDECTOMY  age 29   CARPAL TUNNEL RELEASE Right    early 2000s   CATARACT EXTRACTION     bilateral   CHOLECYSTECTOMY  age 43   EYE SURGERY     FOOT ARTHRODESIS Right 02/02/2013   Procedure: RIGHT HALLUX METATARSAL PHALANGEAL JOINT ARTHRODESIS ;  Surgeon: Wylene Simmer, MD;  Location: [facility];  Service: Orthopedics;  Laterality: Right;   INGUINAL HERNIA REPAIR  age 89   rt side   NASAL CONCHA BULLOSA RESECTION  age 58   PROSTATE SURGERY     SHOULDER ARTHROSCOPY W/ ROTATOR CUFF REPAIR Right    early 2000s   tonsil     VASECTOMY  age 64    There were no vitals filed for this visit.    Subjective Assessment - 07/01/21 1031     Subjective reports trouble with depression and SAD lately- which he has had problems with in the past. Lacking motivation to complete tasks around the house and his HEP. Has an appointment with PCP next week to address this.    Pertinent History PMH: pAF on Eliquis (to see cardiologist on 04/23/21), chronic pain on oxycodone and Ambien, previous SDH, COPD on home O2 noct, HTN, hypothyroidism, OSA on CPAP, and DM    Patient Stated Goals Pt's goal for therapy is to improve balance, strengthening, and walking.    Currently in Pain? No/denies                               [facility] Adult PT Treatment/Exercise - 07/01/21 0001       Knee/Hip Exercises: Aerobic   Nustep Level 5, UEs/LEs  x 6 minutes for leg strength, flexibility.   pt noting abnormal     Knee/Hip Exercises: Standing   Hip Flexion Stengthening;Both;Knee bent;1 set;20 reps    Hip Flexion Limitations resisted march with yellow TB   good posture   Other Standing Knee Exercises sidestepping with red loop 4x46ft with CGA      Knee/Hip Exercises: Supine   Bridges Strengthening;Both;1 set;10 reps    Bridges Limitations straight leg bridge on pball   cues for improved control/stability     Knee/Hip Exercises: Sidelying   Hip ABduction Strengthening;Right;Left;10 reps;2 sets    Hip ABduction Limitations cues for alignment   2nd set with 2#                    PT Education - 07/01/21 1034     Education Details Spoke with patient about resuming small tasks that both him and his wife enjoy and to break chores into smaller pieces to decrease overwhelm during exaccerbation of depression    Person(s) Educated Patient    Methods Explanation    Comprehension Verbalized understanding              PT Short Term Goals - 06/10/21 1210       PT SHORT TERM GOAL #1   Title Pt will be independent with progression of HEP for improved strength, balance, transfers, and  gait.  TARGET 07/11/2021    Time 4    Period Weeks    Status Revised      PT SHORT TERM GOAL #2   Title Pt will improve 5x sit<>stand to less than or equal to 11.5 sec to demonstrate improved functional strength and transfer efficiency.    Baseline 12.38 sec with  decreased forward lean to initiate; 05/13/21:  15.47 sec, 2nd trial 12.38 sec    Time 4    Period Weeks    Status On-going               PT Long Term Goals - 06/10/21 1212       PT LONG TERM GOAL #1   Title Pt will be independent with final HEP for improved strength, balance, transfers, and gait.  TARGET 07/25/2021    Time 6    Period Weeks    Status On-going      PT LONG TERM GOAL #2   Title Pt will improve FGA score to at least 24/30 to decrease fall risk.    Baseline 15/24; 21/24 06/10/2021    Time 6    Status Revised      PT LONG TERM GOAL #3   Title Patient to complete 419m on 6 minute walk test with LRAD. (>1300 ft)    Baseline 1206 ft    Time 6    Period Weeks    Status Revised      PT LONG TERM GOAL #4   Title Pt will verbalize plans for continued community fitness upon d/c from PT to maximize gains made in therapy.    Time 6    Period Weeks    Status On-going                   Plan - 07/01/21 1101     Clinical Impression Statement Patient arrived to session with report of worsening seasonal depression recently. Reports decreased interest and motivation however has an appointment with PCP next week to address this. Reports that he continues to take his antidepressants as instructed. Spoke with patient about resuming small tasks that both him and his wife enjoy and to break chores into smaller pieces to decrease overwhelm. Patients Apple watch showed 1 spike of possible a-fib during warm up however patient asymptomatic and HR 60 bpm once checked at rest. Reviewed hip strengthening for improved carryover; able to progress lateral hip strengthening with weighted resistance today. Patient  tolerated session well and without complaints upon leaving.    Comorbidities PMH: pAF on Eliquis, chronic pain on oxycodone and Ambien, previous SDH, COPD on home O2 noct, HTN, hypothyroidism, OSA on CPAP, and DM, peripheral neuropathy    PT Frequency 1x / week    PT Duration 8 weeks    PT Treatment/Interventions ADLs/Self Care Home Management;Gait training;Functional mobility training;Therapeutic activities;Therapeutic exercise;Balance training;Neuromuscular re-education;Patient/family education    PT Next Visit Plan Need to make additions/upgrades to HEP for compliant surface/vestibular system use for balance as well as ankle/hip/step strategy as needed; vestibular training for balance, lateral weigthshifting and hip strengthening, gait with head turns/nods and compliant surfaces.  Try resisted gait and side step activities for balance recovery    Consulted and Agree with Plan of Care Patient             Patient will benefit from skilled therapeutic intervention in order to improve the following deficits and impairments:  Abnormal gait, Difficulty walking, Decreased balance, Decreased mobility, Decreased strength  Visit Diagnosis: Unsteadiness on feet  Muscle weakness (generalized)  Other abnormalities of gait and mobility     Problem List Patient Active Problem List   Diagnosis Date Noted   Aortic atherosclerosis ([place]) 02/04/2021   Mild cognitive impairment 01/04/2021   Fatty liver 12/29/2020   Thrombocytopenia 12/20/2020   Urinary frequency 12/20/2020   Arthritis of hand 11/08/2020  Generalized anxiety disorder 06/12/2020   Recurrent falls 03/25/2020   History of SDH 03/21/2020   Leukocytosis 03/14/2020   Rotator cuff arthropathy of left shoulder 07/21/2019   Dupuytren's disease of palm 07/12/2019   Sebaceous cyst 06/27/2019   History of COVID-19 05/08/2019   Obstructive sleep apnea    Asymmetrical left sensorineural hearing loss 07/12/2018   Chronic anticoagulation  12/21/2017   Degeneration of thoracic intervertebral disc 09/21/2017   Thoracic back pain 08/10/2017   Degenerative joint disease of hand 07/14/2017   Osteoarthritis    Fibromyalgia    Fatty tumor    BPH (benign prostatic hyperplasia)    Chronic atrial fibrillation    Insomnia 03/10/2017   Chronic pain, legs and back 03/10/2017   Hyperlipidemia 03/10/2017   Obesity (BMI 30.0-34.9) 12/08/2016   Major depressive disorder 09/07/2016   Spondylolisthesis 09/07/2016   Hypothyroidism 03/08/2016   GERD (gastroesophageal reflux disease) 03/08/2016   Gout 03/08/2016   Glaucoma 03/08/2016   Type 2 diabetes mellitus 03/08/2016   Hereditary and idiopathic peripheral neuropathy 08/28/2015   History of TIA 08/28/2015   Essential hypertension 12/04/2011   Allergic rhinitis, seasonal 12/04/2011   COPD (chronic obstructive pulmonary disease)  07/18/2011    Janene Harvey, PT, DPT 07/01/21 11:03 AM    [facility] [address] [place], [name], [address] Phone: [phone]   Fax:  [phone]  Name: Jonathan [name] MRN: [id number] Date of Birth: 1933/05/08

## 2021-07-01 NOTE — Telephone Encounter (Signed)
Rybelsus 7 mg received today.  Patient notified and samples placed upfront for pick up.

## 2021-07-02 LAB — CBC
Hematocrit: 42.1 % (ref 37.5–51.0)
Hemoglobin: 13.8 g/dL (ref 13.0–17.7)
MCH: 29.1 pg (ref 26.6–33.0)
MCHC: 32.8 g/dL (ref 31.5–35.7)
MCV: 89 fL (ref 79–97)
Platelets: 96 10*3/uL — CL (ref 150–450)
RBC: 4.75 x10E6/uL (ref 4.14–5.80)
RDW: 14.1 % (ref 11.6–15.4)
WBC: 6.8 10*3/uL (ref 3.4–10.8)

## 2021-07-02 LAB — COMPREHENSIVE METABOLIC PANEL
ALT: 16 IU/L (ref 0–44)
AST: 16 IU/L (ref 0–40)
Albumin/Globulin Ratio: 2.6 — ABNORMAL HIGH (ref 1.2–2.2)
Albumin: 4.5 g/dL (ref 3.6–4.6)
Alkaline Phosphatase: 97 IU/L (ref 44–121)
BUN/Creatinine Ratio: 14 (ref 10–24)
BUN: 13 mg/dL (ref 8–27)
Bilirubin Total: 0.3 mg/dL (ref 0.0–1.2)
CO2: 23 mmol/L (ref 20–29)
Calcium: 9.5 mg/dL (ref 8.6–10.2)
Chloride: 106 mmol/L (ref 96–106)
Creatinine, Ser: 0.92 mg/dL (ref 0.76–1.27)
Globulin, Total: 1.7 g/dL (ref 1.5–4.5)
Glucose: 108 mg/dL — ABNORMAL HIGH (ref 70–99)
Potassium: 4.3 mmol/L (ref 3.5–5.2)
Sodium: 145 mmol/L — ABNORMAL HIGH (ref 134–144)
Total Protein: 6.2 g/dL (ref 6.0–8.5)
eGFR: 81 mL/min/{1.73_m2} (ref >59)

## 2021-07-02 LAB — TSH: TSH: 2.98 u[IU]/mL (ref 0.450–4.500)

## 2021-07-04 ENCOUNTER — Encounter: Payer: Self-pay | Admitting: Internal Medicine

## 2021-07-04 DIAGNOSIS — N401 Enlarged prostate with lower urinary tract symptoms: Secondary | ICD-10-CM | POA: Diagnosis not present

## 2021-07-04 DIAGNOSIS — R351 Nocturia: Secondary | ICD-10-CM | POA: Diagnosis not present

## 2021-07-04 DIAGNOSIS — R350 Frequency of micturition: Secondary | ICD-10-CM | POA: Diagnosis not present

## 2021-07-05 MED ORDER — ATORVASTATIN CALCIUM 20 MG PO TABS: 20.0000 mg | ORAL_TABLET | Freq: Every day | ORAL | 1 refills | Status: DC

## 2021-07-05 MED ORDER — PREGABALIN 75 MG PO CAPS: 75.0000 mg | ORAL_CAPSULE | Freq: Two times a day (BID) | ORAL | 0 refills | Status: DC

## 2021-07-06 ENCOUNTER — Encounter: Payer: Self-pay | Admitting: Internal Medicine

## 2021-07-06 NOTE — Progress Notes (Signed)
Subjective:    Patient ID: Jonathan [name], male    DOB: Apr 16, 1933, 88 y.o.   MRN: [id number]  This visit occurred during the SARS-CoV-2 public health emergency.  Safety protocols were in place, including screening questions prior to the visit, additional usage of staff PPE, and extensive cleaning of exam room while observing appropriate contact time as indicated for disinfecting solutions.     HPI The patient is here for follow up of their chronic medical problems, including afib, htn, DM, hld, insomnia, fibromyalgia, peripheral neuropathy, OAB, Hypothyroidism, GAD, MDD, Gout   Recently his platelet count was lower than usual.  He denies any abnormal bruising or bleeding-no bruising has been better than usual.    Having loose stools - not liquid associated with some fecal incontinence.  He has a lot of gas, which is not unusual.  He plans on seeing GI for further evaluation of this.  He is having a hard time getting motivated to clean out the house and prepare to move.  He does not feel sad.  He denies thoughts of suicide.   He is not really looking forward to moving, but not dreading it either..    BP at home 140-159 at times 142/71 this morning at home.  DBP low 70's  Medications and allergies reviewed with patient and updated if appropriate.  Patient Active Problem List   Diagnosis Date Noted   Aortic atherosclerosis ([place]) 02/04/2021   Mild cognitive impairment 01/04/2021   Fatty liver 12/29/2020   Thrombocytopenia 12/20/2020   Urinary frequency 12/20/2020   Arthritis of hand 11/08/2020   Generalized anxiety disorder 06/12/2020   Recurrent falls 03/25/2020   History of SDH 03/21/2020   Rotator cuff arthropathy of left shoulder 07/21/2019   Dupuytren's disease of palm 07/12/2019   Sebaceous cyst 06/27/2019   History of COVID-19 05/08/2019   Obstructive sleep apnea    Asymmetrical left sensorineural hearing loss 07/12/2018   Chronic anticoagulation 12/21/2017    Degeneration of thoracic intervertebral disc 09/21/2017   Thoracic back pain 08/10/2017   Degenerative joint disease of hand 07/14/2017   Osteoarthritis    Fibromyalgia    Fatty tumor    BPH (benign prostatic hyperplasia)    Chronic atrial fibrillation    Insomnia 03/10/2017   Chronic pain, legs and back 03/10/2017   Hyperlipidemia 03/10/2017   Obesity (BMI 30.0-34.9) 12/08/2016   Depression 09/07/2016   Spondylolisthesis 09/07/2016   Hypothyroidism 03/08/2016   GERD (gastroesophageal reflux disease) 03/08/2016   Gout 03/08/2016   Glaucoma 03/08/2016   Type 2 diabetes mellitus 03/08/2016   Hereditary and idiopathic peripheral neuropathy 08/28/2015   History of TIA 08/28/2015   Essential hypertension 12/04/2011   Allergic rhinitis, seasonal 12/04/2011   COPD (chronic obstructive pulmonary disease)  07/18/2011    Current Outpatient Medications on File Prior to Visit  Medication Sig Dispense Refill   acetaminophen (TYLENOL) 650 MG CR tablet Take 1,300 mg by mouth in the morning and at bedtime.     allopurinol (ZYLOPRIM) 300 MG tablet Take 1 tablet (300 mg total) by mouth every evening. For gout 90 tablet 3   amiodarone (PACERONE) 200 MG tablet Take 1 tablet (200 mg) by mouth 2 times a day for 2 weeks then take 1 tablet (200 mg) daily (Patient taking differently: Take 200 mg by mouth daily.) 45 tablet 2   atorvastatin (LIPITOR) 20 MG tablet Take 1 tablet (20 mg total) by mouth daily. For cholesterol 90 tablet 1   Calcium  Citrate-Vitamin D (CALCIUM + D PO) Take 1 tablet by mouth daily.     carboxymethylcellulose (REFRESH PLUS) 0.5 % SOLN Place 1 drop into both eyes 3 (three) times daily as needed (dry eyes).     clobetasol (TEMOVATE) 0.05 % external solution Apply 1 application topically 2 (two) times daily as needed (rash).     Continuous Blood Gluc Receiver (FREESTYLE LIBRE 14 DAY READER) DEVI UAD to check sugars.  E11.65 1 each 0   Continuous Blood Gluc Sensor (FREESTYLE LIBRE 14  DAY SENSOR) MISC UAD to check sugars, E11.65 2 each 5   dapagliflozin propanediol (FARXIGA) 10 MG TABS tablet Take 1 tablet (10 mg total) by mouth daily before breakfast. For diabetes 30 tablet 5   diltiazem (CARDIZEM CD) 300 MG 24 hr capsule TAKE ONE CAPSULE BY MOUTH DAILY 90 capsule 3   diltiazem (CARDIZEM) 30 MG tablet Take 1 tablet (30 mg total) by mouth 2 (two) times daily as needed (sustained elevated heart rates >100 bpm for > 20 minutes.). 30 tablet 2   ELIQUIS 5 MG TABS tablet TAKE ONE TABLET BY MOUTH TWICE A DAY 180 tablet 1   Emollient (RA RENEWAL DARK SPOT CORRECTOR EX) Apply 1 application topically daily as needed (dark spots).     FLUoxetine (PROZAC) 40 MG capsule Take 1 capsule (40 mg total) by mouth daily. For depression 90 capsule 3   Fluticasone Propionate (ALLERGY RELIEF NA) Place 2 sprays into the nose daily as needed (allergies).     glucose blood test strip Use to check blood sugar daily. E11.9 100 each 3   hydrochlorothiazide (HYDRODIURIL) 12.5 MG tablet Take 1 tablet (12.5 mg total) by mouth daily. 90 tablet 3   Lancets MISC Use to check blood sugars daily. E11.9 100 each 3   latanoprost (XALATAN) 0.005 % ophthalmic solution Place 1 drop into both eyes at bedtime.     levothyroxine (SYNTHROID) 112 MCG tablet TAKE ONE TABLET BY MOUTH DAILY BEFORE BREAKFAST 90 tablet 1   Melatonin 5 MG TABS Take 5 mg by mouth at bedtime.     metFORMIN (GLUCOPHAGE-XR) 750 MG 24 hr tablet Take 1 tablet (750 mg total) by mouth in the morning and at bedtime. For diabetes 180 tablet 1   mometasone (NASONEX) 50 MCG/ACT nasal spray Place 2 sprays into the nose as needed (allergies). 17 g 5   Multiple Vitamin (MULTIVITAMIN) tablet Take 1 tablet by mouth daily.     nitroGLYCERIN (NITROSTAT) 0.4 MG SL tablet Place 1 tablet (0.4 mg total) under the tongue every 5 (five) minutes as needed for chest pain. 90 tablet 3   omeprazole (PRILOSEC) 20 MG capsule TAKE ONE CAPSULE BY MOUTH DAILY 90 capsule 1    oxyCODONE-acetaminophen (PERCOCET) 10-325 MG tablet Take 1 tablet by mouth every 8 (eight) hours as needed for pain.     pregabalin (LYRICA) 75 MG capsule Take 1 capsule (75 mg total) by mouth 2 (two) times daily. 60 capsule 0   Probiotic Product (PROBIOTIC PO) Take 1 capsule by mouth daily.     RESTASIS 0.05 % ophthalmic emulsion Place 1 drop into both eyes 2 (two) times daily.      Semaglutide (RYBELSUS) 7 MG TABS Take 7 mg by mouth daily. Via [employer] pt assistance 30 tablet 3   sildenafil (REVATIO) 20 MG tablet Take 40-100 mg by mouth daily as needed.     silodosin (RAPAFLO) 8 MG CAPS capsule Take 8 mg by mouth daily.     Vibegron (  GEMTESA) 75 MG TABS Take 75 mg by mouth daily.     vitamin C (ASCORBIC ACID) 500 MG tablet Take 500 mg by mouth daily.     VITAMIN D, CHOLECALCIFEROL, PO Take 1 capsule by mouth daily.     No current facility-administered medications on file prior to visit.    Past Medical History:  Diagnosis Date   Acute encephalopathy 05/08/2019   Allergic rhinitis, seasonal 12/04/2011   Arthritis of hand 11/08/2020   BPH (benign prostatic hyperplasia)    Bronchitis    Cataract    Chronic anticoagulation 12/21/2017   CHADS VASC=4, Eliquis stopped Sept 2021- recurrent falls with SDH   Chronic atrial fibrillation    On Eliquis   Chronic pain, legs and back 03/10/2017   Preferred pain management Leg and back pain   Compression fracture of thoracic vertebra 09/21/2017   COPD (chronic obstructive pulmonary disease)    2 liters O2 HS   Degeneration of thoracic intervertebral disc 09/21/2017   Degenerative joint disease of hand 07/14/2017   Dupuytren's disease of palm 07/12/2019   Essential hypertension 12/04/2011   Fatty tumor    waste and back   Fibromyalgia    Generalized anxiety disorder 06/12/2020   12/21 He is reporting severe insomnia and anxiety symptoms.  Options are limited.  We will try a low-dose lorazepam at night and during the day for anxiety and  panic attacks.  He will stop lorazepam if problems.  He will stop drinking alcohol.  Discontinue BuSpar.  Reduce Cymbalta to 1 a day.   GERD (gastroesophageal reflux disease)    Glaucoma 03/08/2016   Gout 03/08/2016   Hereditary and idiopathic peripheral neuropathy 08/28/2015   History of COVID-19 05/08/2019   2021 Post-COVID sx's -" brain fog" Try Lion's mane supplement and B complex with niacin for neuropathy   Hyperlipidemia 03/10/2017   Hypothyroidism    Hypoxia    Insomnia    Leukocytosis 03/14/2020   Major depressive disorder 09/07/2016   Obstructive sleep apnea    Osteoarthritis    Osteoporosis    Recurrent falls 03/25/2020   PT. Treat neuropathy, insomnia.  Reduce Cymbalta to 1 a day.  Discontinue BuSpar.   Rotator cuff arthropathy of left shoulder 07/21/2019   SDH (subdural hematoma) 03/21/2020   Recurrent SDH secondary to falls at home- Sept 2019 and again 03/14/2020- Eliquis stopped.   Sebaceous cyst 06/27/2019   Spinal compression fracture seventh vertebre   Spondylolisthesis 09/07/2016   On fosamax - for about one year - Dr Alyson Ingles  10/14/17 dexa: normal dexa -- spine 2.1,   RFN -0.9,  LFN   -0.7   - no comparison on file, previous fracture      Thoracic back pain 08/10/2017   Thrombocytopenia 12/20/2020   TIA (transient ischemic attack) 08/28/2015   Type 2 diabetes mellitus 03/08/2016    Past Surgical History:  Procedure Laterality Date   APPENDECTOMY  age 34   [place] Right    early 2000s   CATARACT EXTRACTION     bilateral   CHOLECYSTECTOMY  age 24   EYE SURGERY     FOOT ARTHRODESIS Right 02/02/2013   Procedure: RIGHT HALLUX METATARSAL PHALANGEAL JOINT ARTHRODESIS ;  Surgeon: Wylene Simmer, MD;  Location: [place];  Service: Orthopedics;  Laterality: Right;   INGUINAL HERNIA REPAIR  age 41   rt side   NASAL CONCHA BULLOSA RESECTION  age 55   PROSTATE SURGERY     SHOULDER ARTHROSCOPY W/  ROTATOR CUFF REPAIR Right    early 2000s    tonsil     VASECTOMY  age 66    Social History   Socioeconomic History   Marital status: Married    Spouse name: Not on file   Number of children: 2   Years of education: 18   Highest education level: Master's degree (e.g., MA, MS, MEng, MEd, MSW, MBA)  Occupational History   Occupation: RETIRED    Employer: RETIRED  Tobacco Use   Smoking status: Former    Packs/day: 2.00    Years: 10.00    Pack years: 20.00    Types: Cigarettes    Quit date: 07/18/1975    Years since quitting: 46.0   Smokeless tobacco: Never  Vaping Use   Vaping Use: Never used  Substance and Sexual Activity   Alcohol use: Yes    Comment: 1-2 drinks/day   Drug use: No   Sexual activity: Not Currently  Other Topics Concern   Not on file  Social History Narrative   Not on file   Social Determinants of Health   Financial Resource Strain: Not on file  Food Insecurity: Not on file  Transportation Needs: Not on file  Physical Activity: Not on file  Stress: Not on file  Social Connections: Not on file    Family History  Problem Relation Age of Onset   Coronary artery disease Brother    Heart disease Father    Lung cancer Father    Kidney cancer Father    Prostate cancer Father    Arthritis Mother    Lung cancer Mother    Dementia Mother        Unspecified type, not Alzheimer's disease    Review of Systems  Constitutional:  Negative for fever.  Respiratory:  Negative for cough, shortness of breath and wheezing.   Cardiovascular:  Negative for chest pain, palpitations and leg swelling.  Gastrointestinal:  Negative for blood in stool.  Genitourinary:  Negative for hematuria.  Neurological:  Negative for light-headedness and headaches.  Hematological:  Does not bruise/bleed easily.      Objective:   Vitals:   07/09/21 0825  BP: 128/60  Pulse: (!) 58  Temp: 98 F (36.7 C)  SpO2: 93%   BP Readings from Last 3 Encounters:  07/09/21 128/60  06/17/21 126/60  04/23/21 (!) 136/55   Wt  Readings from Last 3 Encounters:  07/09/21 176 lb (79.8 kg)  06/17/21 179 lb 9.6 oz (81.5 kg)  04/23/21 184 lb 9.6 oz (83.7 kg)   Body mass index is 27.98 kg/m.   Depression screen [facility] 2/9 07/09/2021 04/07/2021 11/08/2020 06/12/2020 04/26/2020  Decreased Interest 1 0 0 1 0  Down, Depressed, Hopeless 1 0 1 2 0  PHQ - 2 Score 2 0 1 3 0  Altered sleeping 2 2 3 3  -  Tired, decreased energy 1 1 3  0 -  Change in appetite 0 0 0 0 -  Feeling bad or failure about yourself  0 0 0 0 -  Trouble concentrating 1 1 0 0 -  Moving slowly or fidgety/restless 0 0 0 0 -  Suicidal thoughts 0 0 0 0 -  PHQ-9 Score 6 4 7 6  -  Difficult doing work/chores Somewhat difficult Not difficult at all Somewhat difficult - -  Some recent data might be hidden    GAD 7 : Generalized Anxiety Score 07/09/2021 04/07/2021 11/08/2020 08/14/2019  Nervous, Anxious, on Edge 1 0 0 0  Control/stop  worrying 1 0 1 0  Worry too much - different things 1 0 1 1  Trouble relaxing 0 0 0 1  Restless 0 0 0 0  Easily annoyed or irritable 0 0 1 1  Afraid - awful might happen 0 0 0 0  Total GAD 7 Score 3 0 3 3  Anxiety Difficulty Somewhat difficult - Not difficult at all -      Physical Exam    Constitutional: Appears well-developed and well-nourished. No distress.  HENT:  Head: Normocephalic and atraumatic.  Neck: Neck supple. No tracheal deviation present. No thyromegaly present.  No cervical lymphadenopathy Cardiovascular: Normal rate, regular rhythm and normal heart sounds.   No murmur heard. No carotid bruit .  No edema Pulmonary/Chest: Effort normal and breath sounds normal. No respiratory distress. No has no wheezes. No rales.  Skin: Skin is warm and dry. Not diaphoretic.  Psychiatric: Normal mood and affect. Behavior is normal.      Assessment & Plan:    See Problem List for Assessment and Plan of chronic medical problems.

## 2021-07-06 NOTE — Patient Instructions (Addendum)
° ° °  Pneumonia vaccine given today   Blood work was ordered.     Medications changes include :   none   A referral was ordered for hematology/oncology.       Someone from their office will call you to schedule an appointment.    Please followup in 4 months

## 2021-07-07 ENCOUNTER — Encounter: Payer: Self-pay | Admitting: Cardiology

## 2021-07-07 NOTE — Telephone Encounter (Signed)
Spoke with pt regarding follow up appointment. Able to make appointment with Jonathan [place], NP at [facility] location. Pt would like for me to forward this message to management so that they are aware of how frustrating it is for our pts get a follow up visit with one of our providers.

## 2021-07-08 ENCOUNTER — Other Ambulatory Visit: Payer: Self-pay

## 2021-07-08 ENCOUNTER — Ambulatory Visit: Payer: PPO

## 2021-07-08 ENCOUNTER — Encounter: Payer: Self-pay | Admitting: Physical Therapy

## 2021-07-08 ENCOUNTER — Ambulatory Visit: Payer: PPO | Admitting: Physical Therapy

## 2021-07-08 DIAGNOSIS — M6281 Muscle weakness (generalized): Secondary | ICD-10-CM

## 2021-07-08 DIAGNOSIS — R4701 Aphasia: Secondary | ICD-10-CM

## 2021-07-08 DIAGNOSIS — R2681 Unsteadiness on feet: Secondary | ICD-10-CM | POA: Diagnosis not present

## 2021-07-08 DIAGNOSIS — R2689 Other abnormalities of gait and mobility: Secondary | ICD-10-CM

## 2021-07-08 DIAGNOSIS — R41841 Cognitive communication deficit: Secondary | ICD-10-CM

## 2021-07-08 NOTE — Therapy (Signed)
[facility] [address] [place], [name], [address] Phone: [phone]   Fax:  [phone]  Speech Language Pathology Treatment/Renewal summary  Patient Details  Name: Jonathan [name] MRN: [id number] Date of Birth: [birth year] Referring Provider (SLP): Lazaro Arms, NP   Encounter Date: 07/08/2021   End of Session - 07/08/21 1104     Visit Number 7    Number of Visits 9    Date for SLP Re-Evaluation 08/08/21    SLP Start Time 1103    SLP Stop Time  1138    SLP Time Calculation (min) 35 min    Activity Tolerance Patient tolerated treatment well             Past Medical History:  Diagnosis Date   Acute encephalopathy 05/08/2019   Allergic rhinitis, seasonal 12/04/2011   Arthritis of hand 11/08/2020   BPH (benign prostatic hyperplasia)    Bronchitis    Cataract    Chronic anticoagulation 12/21/2017   CHADS VASC=4, Eliquis stopped Sept 2021- recurrent falls with SDH   Chronic atrial fibrillation    On Eliquis   Chronic pain, legs and back 03/10/2017   Preferred pain management Leg and back pain   Compression fracture of thoracic vertebra 09/21/2017   COPD (chronic obstructive pulmonary disease)    2 liters O2 HS   Degeneration of thoracic intervertebral disc 09/21/2017   Degenerative joint disease of hand 07/14/2017   Dupuytren's disease of palm 07/12/2019   Essential hypertension 12/04/2011   Fatty tumor    waste and back   Fibromyalgia    Generalized anxiety disorder 06/12/2020   12/21 He is reporting severe insomnia and anxiety symptoms.  Options are limited.  We will try a low-dose lorazepam at night and during the day for anxiety and panic attacks.  He will stop lorazepam if problems.  He will stop drinking alcohol.  Discontinue BuSpar.  Reduce Cymbalta to 1 a day.   GERD (gastroesophageal reflux disease)    Glaucoma 03/08/2016   Gout 03/08/2016   Hereditary and idiopathic peripheral neuropathy 08/28/2015    History of COVID-19 05/08/2019   2021 Post-COVID sx's -" brain fog" Try Lion's mane supplement and B complex with niacin for neuropathy   Hyperlipidemia 03/10/2017   Hypothyroidism    Hypoxia    Insomnia    Leukocytosis 03/14/2020   Major depressive disorder 09/07/2016   Obstructive sleep apnea    Osteoarthritis    Osteoporosis    Recurrent falls 03/25/2020   PT. Treat neuropathy, insomnia.  Reduce Cymbalta to 1 a day.  Discontinue BuSpar.   Rotator cuff arthropathy of left shoulder 07/21/2019   SDH (subdural hematoma) 03/21/2020   Recurrent SDH secondary to falls at home- Sept 2019 and again 03/14/2020- Eliquis stopped.   Sebaceous cyst 06/27/2019   Spinal compression fracture seventh vertebre   Spondylolisthesis 09/07/2016   On fosamax - for about one year - Dr Alyson Ingles  10/14/17 dexa: normal dexa -- spine 2.1,   RFN -0.9,  LFN   -0.7   - no comparison on file, previous fracture      Thoracic back pain 08/10/2017   Thrombocytopenia 12/20/2020   TIA (transient ischemic attack) 08/28/2015   Type 2 diabetes mellitus 03/08/2016    Past Surgical History:  Procedure Laterality Date   APPENDECTOMY  age 74   CARP[place] TUNNEL RELEASE Right    early 2000s   CATARACT EXTRACTION     bilateral   CHOLECYSTECTOMY  age 63  EYE SURGERY     FOOT ARTHRODESIS Right 02/02/2013   Procedure: RIGHT HALLUX METATARSAL PHALANGEAL JOINT ARTHRODESIS ;  Surgeon: Wylene Simmer, MD;  Location: [place];  Service: Orthopedics;  Laterality: Right;   INGUINAL HERNIA REPAIR  age 62   rt side   NASAL CONCHA BULLOSA RESECTION  age 52   PROSTATE SURGERY     SHOULDER ARTHROSCOPY W/ ROTATOR CUFF REPAIR Right    early 2000s   tonsil     VASECTOMY  age 18    There were no vitals filed for this visit.   Subjective Assessment - 07/08/21 1104     Subjective Pt no-showed his appointmetn at 9:30am and rescheduled to 11:00am.    Currently in Pain? Yes    Pain Score 2     Pain Location Back     Pain Orientation Lower    Pain Descriptors / Indicators Aching    Pain Type Chronic pain                   ADULT SLP TREATMENT - 07/08/21 1116       General Information   Behavior/Cognition Alert;Cooperative;Pleasant mood      Treatment Provided   Treatment provided Cognitive-Linquistic      Cognitive-Linquistic Treatment   Treatment focused on Cognition    Skilled Treatment Pt without notable change since previous session 06-12-21. Barrier for consolidating the to-do lists into one list "I'm not sure there was a barrier, I just didn't do it." SLP explained to [facility] that SLP is unable to assist pt's motivation other than assisting him in organization. SLP reiterated to pt that first step is to consolidate to-do lists and SLP asked for a time when pt could do this -he told SLP this afternoon at 2pm. Pt independently put a reminder into his phone to do this. He told SLP he will bring the list to the next session with items crossed of that he has completed. Today pt put in an item into his calendar and double checked it to ensure it was correctly entered, which it was.      Assessment / Recommendations / Plan   Plan Continue with current plan of care      Progression Toward Goals   Progression toward goals Progressing toward goals              SLP Education - 07/08/21 1146     Education Details SLP cannot assist in increasing pt's motivation    Person(s) Educated Patient    Methods Explanation    Comprehension Verbalized understanding              SLP Short Term Goals - 06/12/21 1323       SLP SHORT TERM GOAL #1   Title pt will undergo cognitive linguistic testing and goals added PRN    Status Achieved    Target Date 05/23/21      SLP SHORT TERM GOAL #2   Title pt will report using 3 compensations for attention between 2 sessions    Baseline 06-03-21    Time 4    Period Weeks    Status Not Met   and ongoing   Target Date 06/13/21              SLP  Long Term Goals - 07/08/21 1148       SLP LONG TERM GOAL #1   Title pt will demo independence with both semantic feature analysis (SFA) and verb  network strengthening treatment (VNeST)    Time 1    Period --   sessions   Status Not Met   and ongoing     SLP LONG TERM GOAL #2   Title pt will report that he feels more confident than prior to ST with his conversational ability    Time 1    Period --   sessions   Status Deferred      SLP LONG TERM GOAL #3   Title pt will report using 3 compensations for attention between 2 sessions    Time 6    Period Weeks    Status On-going      SLP LONG TERM GOAL #4   Title pt will catch errors made on more functional detailed tasks in 3 sessions using compensations    Baseline 07-08-21 (calendar item)    Time 4    Period Weeks    Status On-going    Target Date 08/08/21      SLP LONG TERM GOAL #5   Title pt will score better on cognitive QOL measure than on 05-19-21    Time 4    Period Weeks    Status On-going    Target Date 08/08/21              Plan - 07/08/21 1147     Clinical Impression Statement Pt complained of decr'd motivation. Pt will organize his to-do lists and show this to SLP next session. He will benefit from cont'd skilled ST targeting word finding and these cognitive linguistic deficits. Likely d/c in 1-2 sessions    Speech Therapy Frequency 1x /week   every other week   Duration --   x6 visits   Treatment/Interventions SLP instruction and feedback;Patient/family education;Compensatory techniques;Language facilitation;Multimodal communcation approach    Potential to Achieve Goals Good    SLP Home Exercise Plan semantic feature analysis    Consulted and Agree with Plan of Care Patient             Patient will benefit from skilled therapeutic intervention in order to improve the following deficits and impairments:   Cognitive communication deficit  Aphasia    Problem List Patient Active Problem List    Diagnosis Date Noted   Aortic atherosclerosis ([place]) 02/04/2021   Mild cognitive impairment 01/04/2021   Fatty liver 12/29/2020   Thrombocytopenia 12/20/2020   Urinary frequency 12/20/2020   Arthritis of hand 11/08/2020   Generalized anxiety disorder 06/12/2020   Recurrent falls 03/25/2020   History of SDH 03/21/2020   Rotator cuff arthropathy of left shoulder 07/21/2019   Dupuytren's disease of palm 07/12/2019   Sebaceous cyst 06/27/2019   History of COVID-19 05/08/2019   Obstructive sleep apnea    Asymmetrical left sensorineural hearing loss 07/12/2018   Chronic anticoagulation 12/21/2017   Degeneration of thoracic intervertebral disc 09/21/2017   Thoracic back pain 08/10/2017   Degenerative joint disease of hand 07/14/2017   Osteoarthritis    Fibromyalgia    Fatty tumor    BPH (benign prostatic hyperplasia)    Chronic atrial fibrillation    Insomnia 03/10/2017   Chronic pain, legs and back 03/10/2017   Hyperlipidemia 03/10/2017   Obesity (BMI 30.0-34.9) 12/08/2016   Major depressive disorder 09/07/2016   Spondylolisthesis 09/07/2016   Hypothyroidism 03/08/2016   GERD (gastroesophageal reflux disease) 03/08/2016   Gout 03/08/2016   Glaucoma 03/08/2016   Type 2 diabetes mellitus 03/08/2016   Hereditary and idiopathic peripheral neuropathy 08/28/2015   History of TIA 08/28/2015  Essential hypertension 12/04/2011   Allergic rhinitis, seasonal 12/04/2011   COPD (chronic obstructive pulmonary disease)  07/18/2011    Garald Balding, [place] 07/08/2021, 12:55 PM  [facility] [address] [place], [name], [address] Phone: [phone]   Fax:  [phone]   Name: Jonathan [name] MRN: [id number] Date of Birth: 08-23-1932

## 2021-07-08 NOTE — Therapy (Signed)
[facility] [address], [place], [name], [address] Phone: [phone]   Fax:  [phone]  Physical Therapy Treatment  Patient Details  Name: Jonathan [name] MRN: [id number] Date of Birth: 1932/09/16 Referring Provider (PT): Lazaro Arms, NP   Encounter Date: 07/08/2021   PT End of Session - 07/08/21 1242     Visit Number 12    Number of Visits 15    Date for PT Re-Evaluation 07/25/21    Authorization Type Medicare/BCBS    Progress Note Due on Visit 19   *Progress note completed on 9th visit   PT Start Time 1017    PT Stop Time 1102    PT Time Calculation (min) 45 min    Equipment Utilized During Treatment Gait belt    Activity Tolerance Patient tolerated treatment well    Behavior During Therapy Jonathan [name] for tasks assessed/performed             Past Medical History:  Diagnosis Date   Acute encephalopathy 05/08/2019   Allergic rhinitis, seasonal 12/04/2011   Arthritis of hand 11/08/2020   BPH (benign prostatic hyperplasia)    Bronchitis    Cataract    Chronic anticoagulation 12/21/2017   CHADS VASC=4, Eliquis stopped Sept 2021- recurrent falls with SDH   Chronic atrial fibrillation    On Eliquis   Chronic pain, legs and back 03/10/2017   Preferred pain management Leg and back pain   Compression fracture of thoracic vertebra 09/21/2017   COPD (chronic obstructive pulmonary disease)    2 liters O2 HS   Degeneration of thoracic intervertebral disc 09/21/2017   Degenerative joint disease of hand 07/14/2017   Dupuytren's disease of palm 07/12/2019   Essential hypertension 12/04/2011   Fatty tumor    waste and back   Fibromyalgia    Generalized anxiety disorder 06/12/2020   12/21 He is reporting severe insomnia and anxiety symptoms.  Options are limited.  We will try a low-dose lorazepam at night and during the day for anxiety and panic attacks.  He will stop lorazepam if problems.  He will stop drinking alcohol.   Discontinue BuSpar.  Reduce Cymbalta to 1 a day.   GERD (gastroesophageal reflux disease)    Glaucoma 03/08/2016   Gout 03/08/2016   Hereditary and idiopathic peripheral neuropathy 08/28/2015   History of COVID-19 05/08/2019   2021 Post-COVID sx's -" brain fog" Try Lion's mane supplement and B complex with niacin for neuropathy   Hyperlipidemia 03/10/2017   Hypothyroidism    Hypoxia    Insomnia    Leukocytosis 03/14/2020   Major depressive disorder 09/07/2016   Obstructive sleep apnea    Osteoarthritis    Osteoporosis    Recurrent falls 03/25/2020   PT. Treat neuropathy, insomnia.  Reduce Cymbalta to 1 a day.  Discontinue BuSpar.   Rotator cuff arthropathy of left shoulder 07/21/2019   SDH (subdural hematoma) 03/21/2020   Recurrent SDH secondary to falls at home- Sept 2019 and again 03/14/2020- Eliquis stopped.   Sebaceous cyst 06/27/2019   Spinal compression fracture seventh vertebre   Spondylolisthesis 09/07/2016   On fosamax - for about one year - Dr Alyson Ingles  10/14/17 dexa: normal dexa -- spine 2.1,   RFN -0.9,  LFN   -0.7   - no comparison on file, previous fracture      Thoracic back pain 08/10/2017   Thrombocytopenia 12/20/2020   TIA (transient ischemic attack) 08/28/2015   Type 2 diabetes mellitus 03/08/2016  Past Surgical History:  Procedure Laterality Date   APPENDECTOMY  age 36   CARPAL TUNNEL RELEASE Right    early 2000s   CATARACT EXTRACTION     bilateral   CHOLECYSTECTOMY  age 65   EYE SURGERY     FOOT ARTHRODESIS Right 02/02/2013   Procedure: RIGHT HALLUX METATARSAL PHALANGEAL JOINT ARTHRODESIS ;  Surgeon: Wylene Simmer, MD;  Location: [facility];  Service: Orthopedics;  Laterality: Right;   INGUINAL HERNIA REPAIR  age 81   rt side   NASAL CONCHA BULLOSA RESECTION  age 46   PROSTATE SURGERY     SHOULDER ARTHROSCOPY W/ ROTATOR CUFF REPAIR Right    early 2000s   tonsil     VASECTOMY  age 89    There were no vitals filed for this visit.    Subjective Assessment - 07/08/21 1019     Subjective Has been rushing around. Missed his ST session today- was not on his calendar.    Pertinent History PMH: pAF on Eliquis (to see cardiologist on 04/23/21), chronic pain on oxycodone and Ambien, previous SDH, COPD on home O2 noct, HTN, hypothyroidism, OSA on CPAP, and DM    Patient Stated Goals Pt's goal for therapy is to improve balance, strengthening, and walking.    Currently in Pain? Yes    Pain Score 2     Pain Location Back    Pain Orientation Lower    Pain Descriptors / Indicators Aching    Pain Type Chronic pain                               [facility] Adult PT Treatment/Exercise - 07/08/21 0001       Neuro Re-ed    Neuro Re-ed Details  walking on heels/toes with 1 UE support on counter 4x each; alt toe tap on cone x20 with light 1 UE support   limited height of heel/toe lift     Knee/Hip Exercises: Stretches   Gastroc Stretch Right;Left;1 rep;30 seconds    Gastroc Stretch Limitations runner's stretch      Knee/Hip Exercises: Aerobic   Other Aerobic Scifit L1 x 6 min (UEs/LEs)      Knee/Hip Exercises: Standing   Gait Training turning to walk around stool to replace cones with CGA- limited L foot clearance and tendency to walk too close to stool when turning; gait training with CGA + pivot turns to R/L with cueing to improve L foot clearance                 Balance Exercises - 07/08/21 0001       Balance Exercises: Standing   Standing Eyes Opened Narrow base of support (BOS);Foam/compliant surface;2 reps;30 secs    Standing Eyes Closed Wide (BOA);Foam/compliant surface;30 secs;2 reps    Standing Eyes Closed Limitations unable in romberg d/t imbalance                PT Education - 07/08/21 1242     Education Details update to HEP- Access Code: [email or web address]    Person(s) Educated Patient    Methods Explanation;Demonstration;Tactile cues;Verbal cues;Handout    Comprehension Verbalized  understanding;Returned demonstration              PT Short Term Goals - 06/10/21 1210       PT SHORT TERM GOAL #1   Title Pt will be independent with progression of HEP for improved strength, balance, transfers, and  gait.  TARGET 07/11/2021    Time 4    Period Weeks    Status Revised      PT SHORT TERM GOAL #2   Title Pt will improve 5x sit<>stand to less than or equal to 11.5 sec to demonstrate improved functional strength and transfer efficiency.    Baseline 12.38 sec with decreased forward lean to initiate; 05/13/21:  15.47 sec, 2nd trial 12.38 sec    Time 4    Period Weeks    Status On-going               PT Long Term Goals - 06/10/21 1212       PT LONG TERM GOAL #1   Title Pt will be independent with final HEP for improved strength, balance, transfers, and gait.  TARGET 07/25/2021    Time 6    Period Weeks    Status On-going      PT LONG TERM GOAL #2   Title Pt will improve FGA score to at least 24/30 to decrease fall risk.    Baseline 15/24; 21/24 06/10/2021    Time 6    Status Revised      PT LONG TERM GOAL #3   Title Patient to complete 455m on 6 minute walk test with LRAD. (>1300 ft)    Baseline 1206 ft    Time 6    Period Weeks    Status Revised      PT LONG TERM GOAL #4   Title Pt will verbalize plans for continued community fitness upon d/c from PT to maximize gains made in therapy.    Time 6    Period Weeks    Status On-going                   Plan - 07/08/21 1243     Clinical Impression Statement Patient arrived to session without new complaints. Worked on gait training with focus on turning to address patients concerns on his balance with turning activities. Patient demonstrated limited L foot clearance and tendency to walk too close to stool when turning. Upon discussion, he reports bumping into doorways on the L side which has been an ongoing issue. Proceeded to work on L ankle stretching and stretching and strengthening to  address L foot clearance with gait. Updated HEP with EC, foam, and calf stretching activities to address remaining balance and gait deficits. Patient reported understanding and without complaints at end of session.    Comorbidities PMH: pAF on Eliquis, chronic pain on oxycodone and Ambien, previous SDH, COPD on home O2 noct, HTN, hypothyroidism, OSA on CPAP, and DM, peripheral neuropathy    PT Frequency 1x / week    PT Duration 8 weeks    PT Treatment/Interventions ADLs/Self Care Home Management;Gait training;Functional mobility training;Therapeutic activities;Therapeutic exercise;Balance training;Neuromuscular re-education;Patient/family education    PT Next Visit Plan continue turning activities, vestibular training for balance, lateral weigthshifting and hip strengthening, gait with head turns/nods and compliant surfaces.  Try resisted gait and side step activities for balance recovery    Consulted and Agree with Plan of Care Patient             Patient will benefit from skilled therapeutic intervention in order to improve the following deficits and impairments:  Abnormal gait, Difficulty walking, Decreased balance, Decreased mobility, Decreased strength  Visit Diagnosis: Unsteadiness on feet  Muscle weakness (generalized)  Other abnormalities of gait and mobility     Problem List Patient Active Problem List  Diagnosis Date Noted   Aortic atherosclerosis ([place]) 02/04/2021   Mild cognitive impairment 01/04/2021   Fatty liver 12/29/2020   Thrombocytopenia 12/20/2020   Urinary frequency 12/20/2020   Arthritis of hand 11/08/2020   Generalized anxiety disorder 06/12/2020   Recurrent falls 03/25/2020   History of SDH 03/21/2020   Rotator cuff arthropathy of left shoulder 07/21/2019   Dupuytren's disease of palm 07/12/2019   Sebaceous cyst 06/27/2019   History of COVID-19 05/08/2019   Obstructive sleep apnea    Asymmetrical left sensorineural hearing loss 07/12/2018   Chronic  anticoagulation 12/21/2017   Degeneration of thoracic intervertebral disc 09/21/2017   Thoracic back pain 08/10/2017   Degenerative joint disease of hand 07/14/2017   Osteoarthritis    Fibromyalgia    Fatty tumor    BPH (benign prostatic hyperplasia)    Chronic atrial fibrillation    Insomnia 03/10/2017   Chronic pain, legs and back 03/10/2017   Hyperlipidemia 03/10/2017   Obesity (BMI 30.0-34.9) 12/08/2016   Major depressive disorder 09/07/2016   Spondylolisthesis 09/07/2016   Hypothyroidism 03/08/2016   GERD (gastroesophageal reflux disease) 03/08/2016   Gout 03/08/2016   Glaucoma 03/08/2016   Type 2 diabetes mellitus 03/08/2016   Hereditary and idiopathic peripheral neuropathy 08/28/2015   History of TIA 08/28/2015   Essential hypertension 12/04/2011   Allergic rhinitis, seasonal 12/04/2011   COPD (chronic obstructive pulmonary disease)  07/18/2011    Janene Harvey, PT, DPT 07/08/21 12:47 PM   [facility] [address] [place], [name], [address] Phone: [phone]   Fax:  [phone]  Name: Bhavik Cabiness MRN: [id number] Date of Birth: 08-20-1932

## 2021-07-08 NOTE — Patient Instructions (Signed)
°  Consolidate your to -do lists and schedule them onto your calendar.  Bring the list next time and show me what you've been able to accomplish!

## 2021-07-08 NOTE — Patient Instructions (Signed)
Access Code: [email or web address] URL: [email or web address] Date: 07/08/2021 Prepared by: [facility]  Program Notes perform standing exercises at counter top for safety!   Exercises Romberg Stance with Eyes Closed - 1 x daily - 5 x weekly - 2 sets - 30 sec hold Forward and Backward Step Over with Beam (BKA) - 1 x daily - 5 x weekly - 2 sets - 10 reps Standing Tandem Balance with Counter Support - 1 x daily - 5 x weekly - 1 sets - 3 reps - 30 sec hold Side Stepping with Resistance at Ankles and Counter Support - 1 x daily - 5 x weekly - 2 sets - 10 reps Marching with Resistance - 1 x daily - 5 x weekly - 2 sets - 10 reps Sidelying Hip Abduction - 1 x daily - 5 x weekly - 2 sets - 10 reps Gastroc Stretch on Wall - 1 x daily - 5 x weekly - 2 sets - 30 sec hold Romberg Stance on Foam Pad - 1 x daily - 5 x weekly - 2 sets - 30 sec hold

## 2021-07-09 ENCOUNTER — Ambulatory Visit (INDEPENDENT_AMBULATORY_CARE_PROVIDER_SITE_OTHER): Payer: PPO | Admitting: Internal Medicine

## 2021-07-09 ENCOUNTER — Other Ambulatory Visit: Payer: PPO

## 2021-07-09 VITALS — BP 128/60 | HR 58 | Temp 98.0°F | Ht 66.5 in | Wt 176.0 lb

## 2021-07-09 DIAGNOSIS — I482 Chronic atrial fibrillation, unspecified: Secondary | ICD-10-CM | POA: Diagnosis not present

## 2021-07-09 DIAGNOSIS — M797 Fibromyalgia: Secondary | ICD-10-CM

## 2021-07-09 DIAGNOSIS — D696 Thrombocytopenia, unspecified: Secondary | ICD-10-CM | POA: Diagnosis not present

## 2021-07-09 DIAGNOSIS — F411 Generalized anxiety disorder: Secondary | ICD-10-CM

## 2021-07-09 DIAGNOSIS — E1142 Type 2 diabetes mellitus with diabetic polyneuropathy: Secondary | ICD-10-CM | POA: Diagnosis not present

## 2021-07-09 DIAGNOSIS — E039 Hypothyroidism, unspecified: Secondary | ICD-10-CM | POA: Diagnosis not present

## 2021-07-09 DIAGNOSIS — G609 Hereditary and idiopathic neuropathy, unspecified: Secondary | ICD-10-CM

## 2021-07-09 DIAGNOSIS — E782 Mixed hyperlipidemia: Secondary | ICD-10-CM

## 2021-07-09 DIAGNOSIS — F3289 Other specified depressive episodes: Secondary | ICD-10-CM | POA: Diagnosis not present

## 2021-07-09 DIAGNOSIS — M109 Gout, unspecified: Secondary | ICD-10-CM

## 2021-07-09 DIAGNOSIS — I10 Essential (primary) hypertension: Secondary | ICD-10-CM

## 2021-07-09 DIAGNOSIS — Z23 Encounter for immunization: Secondary | ICD-10-CM

## 2021-07-09 DIAGNOSIS — F330 Major depressive disorder, recurrent, mild: Secondary | ICD-10-CM

## 2021-07-09 LAB — MICROALBUMIN / CREATININE URINE RATIO
Creatinine,U: 71.1 mg/dL
Microalb Creat Ratio: 72.6 mg/g — ABNORMAL HIGH (ref 0.0–30.0)
Microalb, Ur: 51.6 mg/dL — ABNORMAL HIGH (ref 0.0–1.9)

## 2021-07-09 LAB — CBC WITH DIFFERENTIAL/PLATELET
Basophils Absolute: 0 10*3/uL (ref 0.0–0.1)
Basophils Relative: 0.2 % (ref 0.0–3.0)
Eosinophils Absolute: 0 10*3/uL (ref 0.0–0.7)
Eosinophils Relative: 0.3 % (ref 0.0–5.0)
HCT: 44.6 % (ref 39.0–52.0)
Hemoglobin: 14.2 g/dL (ref 13.0–17.0)
Lymphocytes Relative: 18.4 % (ref 12.0–46.0)
Lymphs Abs: 1.5 10*3/uL (ref 0.7–4.0)
MCHC: 31.8 g/dL (ref 30.0–36.0)
MCV: 89.7 fl (ref 78.0–100.0)
Monocytes Absolute: 3 10*3/uL — ABNORMAL HIGH (ref 0.1–1.0)
Monocytes Relative: 35.9 % — ABNORMAL HIGH (ref 3.0–12.0)
Neutro Abs: 3.8 10*3/uL (ref 1.4–7.7)
Neutrophils Relative %: 45.2 % (ref 43.0–77.0)
Platelets: 74 10*3/uL — ABNORMAL LOW (ref 150.0–400.0)
RBC: 4.97 Mil/uL (ref 4.22–5.81)
RDW: 14.9 % (ref 11.5–15.5)
WBC: 8.4 10*3/uL (ref 4.0–10.5)

## 2021-07-09 LAB — LIPID PANEL
Cholesterol: 100 mg/dL (ref 0–200)
HDL: 39.2 mg/dL (ref >39.00)
LDL Cholesterol: 36 mg/dL (ref 0–99)
NonHDL: 61.07
Total CHOL/HDL Ratio: 3
Triglycerides: 126 mg/dL (ref 0.0–149.0)
VLDL: 25.2 mg/dL (ref 0.0–40.0)

## 2021-07-09 LAB — IBC PANEL
Iron: 41 ug/dL — ABNORMAL LOW (ref 42–165)
Saturation Ratios: 9.7 % — ABNORMAL LOW (ref 20.0–50.0)
TIBC: 422.8 ug/dL (ref 250.0–450.0)
Transferrin: 302 mg/dL (ref 212.0–360.0)

## 2021-07-09 LAB — HEMOGLOBIN A1C: Hgb A1c MFr Bld: 5.8 % (ref 4.6–6.5)

## 2021-07-09 LAB — TSH: TSH: 4.46 u[IU]/mL (ref 0.35–5.50)

## 2021-07-09 NOTE — Assessment & Plan Note (Signed)
Chronic Continue lyrica 75 mg bid

## 2021-07-09 NOTE — Assessment & Plan Note (Signed)
Chronic Controlled, stable Continue lyrica 75 mb bid

## 2021-07-09 NOTE — Assessment & Plan Note (Addendum)
Chronic PHQ-9 score shows a score of 6 indicating possible mild depression.  He denies feeling sad and denies any thoughts of hurting himself He does complain of decreased motivation, which is not new-that is something he has been experiencing for a while.  He also states decreased energy Do not think some of his symptoms are representative of depression and he agrees because he does not feel depressed Will continue current medication Continue fluoxetine 40 mg daily Hopefully after he moves he will find things to do where he is moving to, which may bring back some interest in hobbies and make an excited about doing something Will monitor closely  Time spent discussing decreased motivation, decreased concentration and little pleasure in doing things and possible depression-5 minutes

## 2021-07-09 NOTE — Assessment & Plan Note (Signed)
Chronic Check lipid panel  Continue atorvastatin 20 mg daily Regular exercise and healthy diet encouraged  

## 2021-07-09 NOTE — Assessment & Plan Note (Signed)
Chronic Continue allopurinol 300 mg daily for prevention

## 2021-07-09 NOTE — Assessment & Plan Note (Signed)
Chronic  Clinically euthyroid Currently taking levothyroxine 175mcg daily Check tsh  Titrate med dose if needed

## 2021-07-09 NOTE — Assessment & Plan Note (Addendum)
Chronic Following with cardiology On amiodarone, diltiazem For cardioversion tomorrow Ck cbc, tsh

## 2021-07-09 NOTE — Assessment & Plan Note (Signed)
Chronic Lab Results  Component Value Date   HGBA1C 6.6 (H) 02/18/2021   Has been controlled Continue rybelsus 7 mg daily, metformin xr 750 mg bid, farxiga 10 mg qd A1c, urine microalbumin

## 2021-07-09 NOTE — Assessment & Plan Note (Signed)
chronic Has been stable and mild but recent labs show platelet count of 96 Cbc, iron panel today Refer the hem/onc

## 2021-07-09 NOTE — Assessment & Plan Note (Signed)
Chronic GAD-7 score today shows a score of 3-indicating mild anxiety Feels overall his anxiety is controlled Continue fluoxetine 40 mg daily

## 2021-07-09 NOTE — Assessment & Plan Note (Signed)
Chronic BP well controlled Continue diltiazem 300 mg daily, hctz 12.5 mg daily

## 2021-07-10 ENCOUNTER — Encounter (HOSPITAL_COMMUNITY)
Admission: RE | Disposition: A | Discharge status: Home / Self Care | Payer: Self-pay | Source: Home / Self Care | Attending: Cardiology

## 2021-07-10 ENCOUNTER — Other Ambulatory Visit: Payer: Self-pay | Admitting: *Deleted

## 2021-07-10 ENCOUNTER — Encounter (HOSPITAL_COMMUNITY): Payer: Self-pay | Admitting: Certified Registered Nurse Anesthetist

## 2021-07-10 ENCOUNTER — Ambulatory Visit (HOSPITAL_COMMUNITY)
Admission: RE | Admit: 2021-07-10 | Discharge: 2021-07-10 | Disposition: A | Discharge status: Home / Self Care | Payer: PPO | Attending: Cardiology | Admitting: Cardiology

## 2021-07-10 ENCOUNTER — Telehealth: Payer: Self-pay | Admitting: Oncology

## 2021-07-10 ENCOUNTER — Encounter (HOSPITAL_COMMUNITY): Payer: Self-pay | Admitting: Cardiology

## 2021-07-10 DIAGNOSIS — I482 Chronic atrial fibrillation, unspecified: Secondary | ICD-10-CM | POA: Diagnosis not present

## 2021-07-10 DIAGNOSIS — Z538 Procedure and treatment not carried out for other reasons: Secondary | ICD-10-CM | POA: Diagnosis not present

## 2021-07-10 DIAGNOSIS — I493 Ventricular premature depolarization: Secondary | ICD-10-CM | POA: Diagnosis not present

## 2021-07-10 DIAGNOSIS — Z7901 Long term (current) use of anticoagulants: Secondary | ICD-10-CM

## 2021-07-10 DIAGNOSIS — D696 Thrombocytopenia, unspecified: Secondary | ICD-10-CM

## 2021-07-10 LAB — PATHOLOGIST SMEAR REVIEW

## 2021-07-10 SURGERY — CANCELLED PROCEDURE

## 2021-07-10 MED ORDER — SODIUM CHLORIDE 0.9 % IV SOLN: INTRAVENOUS | Status: DC

## 2021-07-10 NOTE — Telephone Encounter (Signed)
Attempted to contact patient to schedule initial  visit from referral. No answer and voicemail was full, pending phone call back to proceed.

## 2021-07-10 NOTE — Progress Notes (Signed)
New patient appt with Dr Benay Spice for 07/29/21 at 1:40 pm with lab appt at 1:30  Lab orders entered

## 2021-07-10 NOTE — Interval H&P Note (Signed)
History and Physical Interval Note:  07/10/2021 9:09 AM  Jonathan [name]  has presented today for surgery, with the diagnosis of A-FIB.  The various methods of treatment have been discussed with the patient and family. After consideration of risks, benefits and other options for treatment, the patient has consented to  Procedure(s): CARDIOVERSION (N/A) as a surgical intervention.  The patient's history has been reviewed, patient examined, no change in status, stable for surgery.  I have reviewed the patient's chart and labs.  Questions were answered to the patient's satisfaction.     Kirk Ruths

## 2021-07-10 NOTE — Progress Notes (Signed)
Patient arrived to unit and placed on monitor and appeared to be in NSR. EKG obtained and reviewed by MD and NSR was confirmed. Patient was taken back to hospital entrance and discharged home.

## 2021-07-10 NOTE — Procedures (Signed)
Electrical Cardioversion Procedure Note Jonathan [name] [id number] 03/26/1933  Procedure: Electrical Cardioversion Indications:  Atrial Fibrillation  Procedure Details Pt in sinus at time of arrival; procedure canceled; continue amiodarone and apixaban   Kirk Ruths 07/10/2021, 7:57 AM

## 2021-07-10 NOTE — H&P (Signed)
Office Visit 06/17/2021 [facility] Darreld Mclean, [place] [facility] Chronic atrial fibrillation ([place]) +7 more Dx Follow-up; Referred by Binnie Rail, MD Reason for Visit   Additional Documentation  Vitals:  BP 126/60 Pulse 76 Ht 5' 6.5" (1.689 m) Wt 81.5 kg SpO2 96% BMI 28.55 kg/m BSA 1.96 m Pain [place] 0-No pain  More Vitals  Flowsheets:  NEWS, MEWS Score, Anthropometrics, Method of Visit   Encounter Info:  Billing Info, History, Allergies, Detailed Report    All Notes    Progress Notes by Darreld Mclean, PA-C at 06/17/2021 8:50 AM  Author: Darreld Mclean, PA-C Author Type: Physician Assistant Certified Filed: 06/19/4006  9:57 AM  Note Status: Signed Cosign: Cosign Not Required Encounter Date: 06/17/2021  Editor: Eppie Gibson (Physician Assistant Certified)             Expand AllCollapse All    Cardiology Office Note:     Date:  06/17/2021    ID:  Terik Haughey, DOB [birth year], MRN [id number]   PCP:  Binnie Rail, MD       Cardiologist:  Kirk Ruths, MD  Electrophysiologist:  None    Referring MD: Binnie Rail, MD    Chief Complaint: follow-up of atrial fibrillation   History of Present Illness:     Jonathan [name] is a 88 y.o. male with a history of chronic atrial fibrillation pn Eliquis, mild to moderate mitral regurgitation, COPD, hypertension, hyperlipidemia,type 2 diabetes mellitus, hypothyroidism, obstructive sleep apnea, prior TIA, fibromyalgia, and GERD who is followed by Dr. Stanford Breed and presents today for follow-up of atrial fibrillation.   Patient is followed primarily for paroxysmal atrial fibrillation. Prior Myoview in 2016 was low risk with no evidence of ischemia. Patient has worn multiple monitors over the last few years (most recently in 05/2020) which have showed PACs, PVCs, brief episodes of NSVT, PAT, and paroxysmal atrial fibrillation. Anticoagulation was temporarily stopped due to recurrent SDH in  02/2018 and again in 02/2020 following falls. However, this was ultimately restarted given prior TIAs and he has tolerated this well.   Patient was last seen by Dr. Stanford Breed on 04/23/2021 at which time he was noted to be in rate controlled atrial fibrillation but was asymptomatic with this. Long discussion was had with patient regarding rate control vs rhythm control. Given patient was asymptomatic, decision was made to focus on rate control with plans for antiarrhythmic followed by cardioversion if he becomes symptomatic. Echo was ordered and showed LVEF of 60-65% with normal wall motion and normal diastolic parameters, normal RV, and mild to moderate MR.    Patient presents today for follow-up.  Here alone.  Patient states he has been feeling more fatigued since his last visit.  He states he just feels "out of it" and states he is having trouble concentrating and accomplishing things.  Again he states all this is worse since his last visit.  He firmly believes this is due to his atrial fibrillation.  He denies any palpitations, lightheadedness, dizziness, near syncope/syncope.  He has an Apple watch and his heart rates are normally in the 60s.  Heart rates will occasionally be in the 90s to 110s but do not stay there for very long.  His heart rate was briefly in the 150s yesterday but did not stand there for long (very rare that his heart rates are this high).  Patient report occasional episodes of sharp very brief chest pain that lasts for couple seconds at a time  and then resolves spontaneously.  He states he also has a sensation elsewhere in his body.  This is not angina.  He denies any shortness of breath, orthopnea, lower extremity edema.  He denies any falls since last visit.  No abnormal bleeding in urine or stools.  He does report intermittent loose stools/diarrhea as well.       Past Medical History:  Diagnosis Date   Acute encephalopathy 05/08/2019   Allergic rhinitis, seasonal 12/04/2011    Arthritis of hand 11/08/2020   BPH (benign prostatic hyperplasia)     Bronchitis     Cataract     Chronic anticoagulation 12/21/2017    CHADS VASC=4, Eliquis stopped Sept 2021- recurrent falls with SDH   Chronic atrial fibrillation      On Eliquis   Chronic pain, legs and back 03/10/2017    Preferred pain management Leg and back pain   Compression fracture of thoracic vertebra 09/21/2017   COPD (chronic obstructive pulmonary disease)      2 liters O2 HS   Degeneration of thoracic intervertebral disc 09/21/2017   Degenerative joint disease of hand 07/14/2017   Dupuytren's disease of palm 07/12/2019   Essential hypertension 12/04/2011   Fatty tumor      waste and back   Fibromyalgia     Generalized anxiety disorder 06/12/2020    12/21 He is reporting severe insomnia and anxiety symptoms.  Options are limited.  We will try a low-dose lorazepam at night and during the day for anxiety and panic attacks.  He will stop lorazepam if problems.  He will stop drinking alcohol.  Discontinue BuSpar.  Reduce Cymbalta to 1 a day.   GERD (gastroesophageal reflux disease)     Glaucoma 03/08/2016   Gout 03/08/2016   Hereditary and idiopathic peripheral neuropathy 08/28/2015   History of COVID-19 05/08/2019    2021 Post-COVID sx's -" brain fog" Try Lion's mane supplement and B complex with niacin for neuropathy   Hyperlipidemia 03/10/2017   Hypothyroidism     Hypoxia     Insomnia     Leukocytosis 03/14/2020   Major depressive disorder 09/07/2016   Obstructive sleep apnea     Osteoarthritis     Osteoporosis     Recurrent falls 03/25/2020    PT. Treat neuropathy, insomnia.  Reduce Cymbalta to 1 a day.  Discontinue BuSpar.   Rotator cuff arthropathy of left shoulder 07/21/2019   SDH (subdural hematoma) 03/21/2020    Recurrent SDH secondary to falls at home- Sept 2019 and again 03/14/2020- Eliquis stopped.   Sebaceous cyst 06/27/2019   Spinal compression fracture seventh vertebre    Spondylolisthesis 09/07/2016    On fosamax - for about one year - Dr Alyson Ingles  10/14/17 dexa: normal dexa -- spine 2.1,   RFN -0.9,  LFN   -0.7   - no comparison on file, previous fracture      Thoracic back pain 08/10/2017   Thrombocytopenia 12/20/2020   TIA (transient ischemic attack) 08/28/2015   Type 2 diabetes mellitus 03/08/2016           Past Surgical History:  Procedure Laterality Date   APPENDECTOMY   age 18   [place] Right      early 2000s   CATARACT EXTRACTION        bilateral   CHOLECYSTECTOMY   age 66   EYE SURGERY       FOOT ARTHRODESIS Right 02/02/2013    Procedure: RIGHT HALLUX METATARSAL PHALANGEAL JOINT ARTHRODESIS ;  Surgeon: Wylene Simmer, MD;  Location: [facility];  Service: Orthopedics;  Laterality: Right;   INGUINAL HERNIA REPAIR   age 55    rt side   NASAL CONCHA BULLOSA RESECTION   age 29   PROSTATE SURGERY       SHOULDER ARTHROSCOPY W/ ROTATOR CUFF REPAIR Right      early 2000s   tonsil       VASECTOMY   age 78      Current Medications: Active Medications      Current Meds  Medication Sig   acetaminophen (TYLENOL) 650 MG CR tablet Take 650 mg by mouth in the morning and at bedtime. Pt takes 2 tabs 2 times daily   allopurinol (ZYLOPRIM) 300 MG tablet Take 1 tablet (300 mg total) by mouth every evening. For gout   amiodarone (PACERONE) 200 MG tablet Take 1 tablet (200 mg) by mouth 2 times a day for 2 weeks then take 1 tablet (200 mg) daily   atorvastatin (LIPITOR) 20 MG tablet Take 1 tablet (20 mg total) by mouth daily. For cholesterol   bimatoprost (LUMIGAN) 0.01 % SOLN Place 1 drop into both eyes at bedtime.    calcium-vitamin D (OSCAL WITH D) 500-200 MG-UNIT tablet Take 1 tablet by mouth daily with breakfast.   colchicine 0.6 MG tablet Take 0.6 mg by mouth daily as needed (flareups).    Continuous Blood Gluc Receiver (FREESTYLE LIBRE 14 DAY READER) DEVI UAD to check sugars.  E11.65   Continuous Blood Gluc Sensor (FREESTYLE  LIBRE 14 DAY SENSOR) MISC UAD to check sugars, E11.65   dapagliflozin propanediol (FARXIGA) 10 MG TABS tablet Take 1 tablet (10 mg total) by mouth daily before breakfast. For diabetes   diltiazem (CARDIZEM CD) 300 MG 24 hr capsule TAKE ONE CAPSULE BY MOUTH DAILY   diltiazem (CARDIZEM) 30 MG tablet Take 1 tablet (30 mg total) by mouth 2 (two) times daily as needed (sustained elevated heart rates >100 bpm for > 20 minutes.).   ELIQUIS 5 MG TABS tablet TAKE ONE TABLET BY MOUTH TWICE A DAY   FLUoxetine (PROZAC) 40 MG capsule Take 1 capsule (40 mg total) by mouth daily. For depression   glucose blood test strip Use to check blood sugar daily. E11.9   hydrochlorothiazide (HYDRODIURIL) 12.5 MG tablet Take 1 tablet (12.5 mg total) by mouth daily.   Lancets MISC Use to check blood sugars daily. E11.9   latanoprost (XALATAN) 0.005 % ophthalmic solution 1 drop at bedtime.   levothyroxine (SYNTHROID) 112 MCG tablet TAKE ONE TABLET BY MOUTH DAILY BEFORE BREAKFAST   Melatonin 5 MG TABS Take 10 mg by mouth at bedtime.   metFORMIN (GLUCOPHAGE-XR) 750 MG 24 hr tablet Take 1 tablet (750 mg total) by mouth in the morning and at bedtime. For diabetes   mometasone (NASONEX) 50 MCG/ACT nasal spray Place 2 sprays into the nose as needed (allergies).   Multiple Vitamin (MULTIVITAMIN) tablet Take 1 tablet by mouth daily.   nitroGLYCERIN (NITROSTAT) 0.4 MG SL tablet Place 1 tablet (0.4 mg total) under the tongue every 5 (five) minutes as needed for chest pain.   omeprazole (PRILOSEC) 20 MG capsule TAKE ONE CAPSULE BY MOUTH DAILY   oxyCODONE-acetaminophen (PERCOCET) 10-325 MG tablet Take 1 tablet by mouth every 8 (eight) hours as needed for pain.   pregabalin (LYRICA) 75 MG capsule TAKE ONE CAPSULE BY MOUTH TWICE A DAY   RESTASIS 0.05 % ophthalmic emulsion Place 1 drop into both eyes 2 (two) times  daily.    Semaglutide (RYBELSUS) 7 MG TABS Take 7 mg by mouth daily. Via [employer] pt assistance   sildenafil (REVATIO) 20 MG  tablet Take 40-100 mg by mouth daily as needed.   vitamin C (ASCORBIC ACID) 500 MG tablet Take 500 mg by mouth 2 (two) times daily.    VITAMIN D, CHOLECALCIFEROL, PO Take 25 mcg by mouth daily.         Allergies:   Other    Social History         Socioeconomic History   Marital status: Married      Spouse name: Not on file   Number of children: 2   Years of education: 71   Highest education level: Master's degree (e.g., MA, MS, MEng, MEd, MSW, MBA)  Occupational History   Occupation: RETIRED      Employer: RETIRED  Tobacco Use   Smoking status: Former      Packs/day: 2.00      Years: 10.00      Pack years: 20.00      Types: Cigarettes      Quit date: 07/18/1975      Years since quitting: 45.9   Smokeless tobacco: Never  Vaping Use   Vaping Use: Never used  Substance and Sexual Activity   Alcohol use: Yes      Comment: 1-2 drinks/day   Drug use: No   Sexual activity: Not Currently  Other Topics Concern   Not on file  Social History Narrative   Not on file    Social Determinants of Health    Financial Resource Strain: Not on file  Food Insecurity: Not on file  Transportation Needs: Not on file  Physical Activity: Not on file  Stress: Not on file  Social Connections: Not on file      Family History: The patient's family history includes Arthritis in his mother; Coronary artery disease in his brother; Dementia in his mother; Heart disease in his father; Kidney cancer in his father; Lung cancer in his father and mother; Prostate cancer in his father.   ROS:   Please see the history of present illness.      EKGs/Labs/Other Studies Reviewed:     The following studies were reviewed today:   Myoview 12/27/2014: Nuclear stress EF: 56%. The left ventricular ejection fraction is normal (55-65%). The study is normal. This is a low risk study. _______________   Event Monitor 02/08/2017 to 03/09/2017: Sinus with PACs, PVCs, and PAF (intermittent  RVR). _______________   Elwyn Reach Monitor 12/2017: Sinus with PACs, PVCs, rare couplet and 3 beats NSVT. _______________   Elwyn Reach Monitor 05/2020: Sinus rhythm with PVCs, PACs, PAF, PAT and 4 beats NSVT. _______________   Echocardiogram 05/07/2021: Impressions:  1. Left ventricular ejection fraction, by estimation, is 60 to 65%. The  left ventricle has normal function. The left ventricle has no regional  wall motion abnormalities. Left ventricular diastolic parameters were  normal.   2. Right ventricular systolic function is normal. The right ventricular  size is normal.   3. The mitral valve is normal in structure. Mild to moderate mitral valve  regurgitation.   4. The aortic valve is normal in structure. Aortic valve regurgitation is  not visualized.   EKG:  EKG ordered today. EKG personally reviewed and demonstrates atrial fibrillation, rate 76 bpm, with PVC and non-specific ST/T changes. Normal axis. QTc 472 ms.    Recent Labs: 11/08/2020: Hemoglobin 13.3; Platelets 128 12/20/2020: TSH 2.62 02/18/2021: ALT 18; BUN  16; Creatinine, Ser 0.92; Potassium 3.9; Sodium 139  Recent Lipid Panel Labs (Brief)          Component Value Date/Time    CHOL 138 11/08/2020 1624    TRIG 245 (H) 11/08/2020 1624    HDL 42 11/08/2020 1624    CHOLHDL 3.3 11/08/2020 1624    VLDL 42.2 (H) 08/14/2019 1028    LDLCALC 65 11/08/2020 1624    LDLDIRECT 51.0 08/14/2019 1028        Physical Exam:     Vital Signs: BP 126/60    Pulse 76    Ht 5' 6.5" (1.689 m)    Wt 179 lb 9.6 oz (81.5 kg)    SpO2 96%    BMI 28.55 kg/m         Wt Readings from Last 3 Encounters:  06/17/21 179 lb 9.6 oz (81.5 kg)  04/23/21 184 lb 9.6 oz (83.7 kg)  04/07/21 190 lb (86.2 kg)      General: 88 y.o. Caucasian male in no acute distress. HEENT: Normocephalic and atraumatic. Sclera clear. EOMs intact. Neck: Supple. No JVD. Heart: Irregularly irregular rhythm with normal rate. Distinct S1 and S2. Very soft murmur noted at apex.  No gallops or rubs. Radial and pulses 2+ and equal bilaterally. Lungs: No increased work of breathing. Clear to ausculation bilaterally. No wheezes, rhonchi, or rales.  Abdomen: Soft, non-distended, and non-tender to palpation.  Extremities: No lower extremity edema.    Skin: Warm and dry. Neuro: Alert and oriented x3. No focal deficits. Psych: Normal affect. Responds appropriately.   Assessment:     1. Chronic atrial fibrillation ([place])   2. Mitral valve insufficiency, unspecified etiology   3. Mixed hyperlipidemia   4. Type 2 diabetes mellitus without complication, without long-term current use of insulin ([place])   5. Obstructive sleep apnea   6. Fatigue, unspecified type   7. Medication management   8. Preprocedural examination       Plan:     Chronic Atrial Fibrillation History of paroxysmal atrial fibrillation. Noted on multiple monitors in the past. Plan is for rate control as long as patient remains asymptomatic.  Patient presents today in rate controlled atrial fibrillation.  He reports worsening fatigue since his last visit which he firmly believes is due to his atrial fibrillation.  Otherwise, asymptomatic. - Continue Cardizem CD 300mg  daily. Also has short acting Cardizem 30mg  to take as needed for palpitations/tachycardia. - CHA2DS2-VASc = 6 (HTN DM, TIA x2, age x2). Continue Eliquis 5mg  twice daily. Of note, patient does have a history of SDH due to falls but is tolerating anticoagulation well. Denies any recent falls.  - We had a long discussion about continued rate control versus antiarrhythmic therapy followed by cardioversion as recommended at last visit with Dr. Stanford Breed.  Patient would like to attempt to restore sinus rhythm. Therefore, will load patient with Amiodarone and then proceed with DCCV. Will start Amiodarone 200 mg twice daily for 2 weeks at which time he can decrease to 200 mg daily.  We will arrange DCCV in about 3 weeks after Amiodarone load.  Patient denies  missing any doses of his Eliquis.  Discussed the importance of uninterrupted anticoagulation for and after cardioversion. Will check CBC, CMET, and TSH.   Shared Decision Making/Informed Consent The risks (stroke, cardiac arrhythmias rarely resulting in the need for a temporary or permanent pacemaker, skin irritation or burns and complications associated with conscious sedation including aspiration, arrhythmia, respiratory failure and death), benefits (restoration of  normal sinus rhythm) and alternatives of a direct current cardioversion were explained in detail to Mr. Moodie and he agrees to proceed.    Mitral Regurgitation Mild to moderate MR noted on recent Echo in 04/2021. - Will continue routine monitoring. Recommend repeat Echo in 04/2023 or sooner if he develops concerning symptoms.    Hypertension BP well controlled. - Continue current medications: HCTZ 12.5mg  daily and Cardizem CD 300mg  daily.    Hyperlipidemia Lipid panel from 10/2020: Total Cholesterol 128, Triglycerides 245, HDL 42, LDL 65. - Continue Lipitor 20mg  daily.  - Labs followed by PCP.   Type 2 Diabetes Mellitus Hemoglobin A1c 6.6 in 02/2021. - On Metformin, Semaglutide, and Farxiga. - Managed by PCP.   Obstructive Sleep Apnea Compliant with CPAP. Continue.   Fatigue Patient reports worsening fatigue as well as trouble concentrating and trouble accomplishing things at times.  I am not convinced tha that his fatigue is necessarily due to his atrial fibrillation; however, patient would really like to try restoring sinus rhythm.   - Plan DCCV after amiodarone load as described above.   - We will check TSH and CBC prior to cardioversion as well.   - Also recommended discussing symptoms with PCP.     Disposition: Follow up in about 6 weeks after cardioversion.     Medication Adjustments/Labs and Tests Ordered: Current medicines are reviewed at length with the patient today.  Concerns regarding medicines are  outlined above.     Orders Placed This Encounter  Procedures   CBC   TSH   Comprehensive metabolic panel   EKG [id number]        Meds ordered this encounter  Medications   amiodarone (PACERONE) 200 MG tablet      Sig: Take 1 tablet (200 mg) by mouth 2 times a day for 2 weeks then take 1 tablet (200 mg) daily      Dispense:  45 tablet      Refill:  2      Patient Instructions  Medication Instructions:  START Amiodarone 200 mg 2 times a day for 2 weeks then week 3 going forward take 200 mg daily *If you need a refill on your cardiac medications before your next appointment, please call your pharmacy*   Lab Work: Your physician recommends that you return for lab work on 07/02/21:  CMET CBC TSH If you have labs (blood work) drawn today and your tests are completely normal, you will receive your results only by: [place] (if you have [facility]) OR A paper copy in the mail If you have any lab test that is abnormal or we need to change your treatment, we will call you to review the results.   Testing/Procedures: Your physician has recommended that you have a Cardioversion (DCCV). Electrical Cardioversion uses a jolt of electricity to your heart either through paddles or wired patches attached to your chest. This is a controlled, usually prescheduled, procedure. Defibrillation is done under light anesthesia in the hospital, and you usually go home the day of the procedure. This is done to get your heart back into a normal rhythm. You are not awake for the procedure. Please see the instruction sheet given to you today.   Scheduled for 07/09/21 with Dr. Rosaland Lao at 9:00 AM   Follow-Up: At [facility], you and your health needs are our priority.  As part of our continuing mission to provide you with exceptional heart care, we have created designated Provider Care Teams.  These Care  Teams include your primary Cardiologist (physician) and Advanced Practice Providers (APPs -   Physician Assistants and Nurse Practitioners) who all work together to provide you with the care you need, when you need it.   Your next appointment:   5-6 week(s)   The format for your next appointment:   In Person   Provider:   Kirk Ruths, MD  or  APP         Other Instructions     You are scheduled for a TEE/Cardioversion/TEE Cardioversion on 07/09/21 with Dr. Johney Frame.  Please arrive at the [facility] (Main Entrance A) at [facility]: [address] [place],  [address] at 8:00 AM.  DIET: Nothing to eat or drink after midnight except a sip of water with medications (see medication instructions below)   FYI: For your safety, and to allow [place] to monitor your vital signs accurately during the surgery/procedure we request that   if you have artificial nails, gel coating, SNS etc. Please have those removed prior to your surgery/procedure. Not having the nail coverings /polish removed may result in cancellation or delay of your surgery/procedure.     Medication Instructions: Hold Hydrochlorothiazide (HCTZ) the morning of Cardioversion  Hold Metformin, Farxiga, Semaglutide the morning of the Cardioversion      Continue your anticoagulant: Apixaban (Eliquis) You will need to continue your anticoagulant after your procedure until you            are told by your  Provider that it is safe to stop     Labs: If patient is on Coumadin, patient needs pt/INR, CBC, BMET within 3 days (No pt/INR needed for patients taking Xarelto, Eliquis, Pradaxa) For patients receiving anesthesia for TEE and all Cardioversion patients: BMET, CBC within 1 week   Come to: [facility] [address] between the hours of 8:00 am and 4:30 pm. You do not have to be fasting.   You must have a responsible person to drive you home and stay in the waiting area during your procedure. Failure to do so could result in cancellation.   Bring your insurance cards.   *Special Note: Every effort is  made to have your procedure done on time. Occasionally there are emergencies that occur at the hospital that may cause delays. Please be patient if a delay does occur.      Signed, Darreld Mclean, PA-C  06/17/2021 9:49 AM    [facility]       For DCCV; compliant with apixaban; no changes. Kirk Ruths

## 2021-07-11 ENCOUNTER — Other Ambulatory Visit: Payer: Self-pay | Admitting: Internal Medicine

## 2021-07-15 ENCOUNTER — Ambulatory Visit: Payer: PPO

## 2021-07-15 ENCOUNTER — Ambulatory Visit: Payer: PPO | Admitting: Physical Therapy

## 2021-07-15 ENCOUNTER — Inpatient Hospital Stay: Payer: PPO

## 2021-07-15 ENCOUNTER — Other Ambulatory Visit: Payer: Self-pay

## 2021-07-15 ENCOUNTER — Encounter: Payer: Self-pay | Admitting: Physical Therapy

## 2021-07-15 ENCOUNTER — Inpatient Hospital Stay: Payer: PPO | Attending: Oncology | Admitting: Oncology

## 2021-07-15 VITALS — BP 139/63 | HR 69 | Temp 97.7°F | Resp 18 | Ht 66.5 in | Wt 176.2 lb

## 2021-07-15 DIAGNOSIS — G629 Polyneuropathy, unspecified: Secondary | ICD-10-CM | POA: Diagnosis not present

## 2021-07-15 DIAGNOSIS — I4891 Unspecified atrial fibrillation: Secondary | ICD-10-CM | POA: Diagnosis not present

## 2021-07-15 DIAGNOSIS — G4733 Obstructive sleep apnea (adult) (pediatric): Secondary | ICD-10-CM | POA: Insufficient documentation

## 2021-07-15 DIAGNOSIS — M797 Fibromyalgia: Secondary | ICD-10-CM

## 2021-07-15 DIAGNOSIS — R2681 Unsteadiness on feet: Secondary | ICD-10-CM

## 2021-07-15 DIAGNOSIS — D696 Thrombocytopenia, unspecified: Secondary | ICD-10-CM | POA: Insufficient documentation

## 2021-07-15 DIAGNOSIS — Z87891 Personal history of nicotine dependence: Secondary | ICD-10-CM | POA: Diagnosis not present

## 2021-07-15 DIAGNOSIS — R41841 Cognitive communication deficit: Secondary | ICD-10-CM

## 2021-07-15 DIAGNOSIS — Z7901 Long term (current) use of anticoagulants: Secondary | ICD-10-CM | POA: Insufficient documentation

## 2021-07-15 DIAGNOSIS — E039 Hypothyroidism, unspecified: Secondary | ICD-10-CM | POA: Insufficient documentation

## 2021-07-15 DIAGNOSIS — I10 Essential (primary) hypertension: Secondary | ICD-10-CM | POA: Diagnosis not present

## 2021-07-15 DIAGNOSIS — M109 Gout, unspecified: Secondary | ICD-10-CM | POA: Diagnosis not present

## 2021-07-15 DIAGNOSIS — I482 Chronic atrial fibrillation, unspecified: Secondary | ICD-10-CM | POA: Insufficient documentation

## 2021-07-15 DIAGNOSIS — E119 Type 2 diabetes mellitus without complications: Secondary | ICD-10-CM | POA: Diagnosis not present

## 2021-07-15 DIAGNOSIS — H409 Unspecified glaucoma: Secondary | ICD-10-CM | POA: Insufficient documentation

## 2021-07-15 DIAGNOSIS — G4730 Sleep apnea, unspecified: Secondary | ICD-10-CM | POA: Diagnosis not present

## 2021-07-15 DIAGNOSIS — Z79899 Other long term (current) drug therapy: Secondary | ICD-10-CM | POA: Diagnosis not present

## 2021-07-15 DIAGNOSIS — Z8616 Personal history of COVID-19: Secondary | ICD-10-CM | POA: Insufficient documentation

## 2021-07-15 DIAGNOSIS — R2689 Other abnormalities of gait and mobility: Secondary | ICD-10-CM

## 2021-07-15 DIAGNOSIS — E785 Hyperlipidemia, unspecified: Secondary | ICD-10-CM | POA: Diagnosis not present

## 2021-07-15 DIAGNOSIS — F419 Anxiety disorder, unspecified: Secondary | ICD-10-CM | POA: Diagnosis not present

## 2021-07-15 DIAGNOSIS — M6281 Muscle weakness (generalized): Secondary | ICD-10-CM

## 2021-07-15 DIAGNOSIS — R4701 Aphasia: Secondary | ICD-10-CM

## 2021-07-15 LAB — CBC WITH DIFFERENTIAL (CANCER CENTER ONLY)
Abs Immature Granulocytes: 0.27 10*3/uL — ABNORMAL HIGH (ref 0.00–0.07)
Basophils Absolute: 0 10*3/uL (ref 0.0–0.1)
Basophils Relative: 0 %
Eosinophils Absolute: 0 10*3/uL (ref 0.0–0.5)
Eosinophils Relative: 0 %
HCT: 43.8 % (ref 39.0–52.0)
Hemoglobin: 13.8 g/dL (ref 13.0–17.0)
Immature Granulocytes: 4 %
Lymphocytes Relative: 17 %
Lymphs Abs: 1.2 10*3/uL (ref 0.7–4.0)
MCH: 28.2 pg (ref 26.0–34.0)
MCHC: 31.5 g/dL (ref 30.0–36.0)
MCV: 89.4 fL (ref 80.0–100.0)
Monocytes Absolute: 2.5 10*3/uL — ABNORMAL HIGH (ref 0.1–1.0)
Monocytes Relative: 33 %
Neutro Abs: 3.4 10*3/uL (ref 1.7–7.7)
Neutrophils Relative %: 46 %
Platelet Count: 97 10*3/uL — ABNORMAL LOW (ref 150–400)
RBC: 4.9 MIL/uL (ref 4.22–5.81)
RDW: 14.4 % (ref 11.5–15.5)
WBC Count: 7.4 10*3/uL (ref 4.0–10.5)
nRBC: 0 % (ref 0.0–0.2)

## 2021-07-15 LAB — SAVE SMEAR(SSMR), FOR PROVIDER SLIDE REVIEW

## 2021-07-15 NOTE — Therapy (Signed)
[facility] [address] [place], [name], [address] Phone: [phone]   Fax:  [phone]  Physical Therapy Treatment  Patient Details  Name: Jonathan [name] MRN: [id number] Date of Birth: 1932-07-24 Referring Provider (PT): Lazaro Arms, NP   Encounter Date: 07/15/2021   PT End of Session - 07/15/21 1153     Visit Number 13    Number of Visits 15    Date for PT Re-Evaluation 07/25/21    Authorization Type Medicare/BCBS    Progress Note Due on Visit 19   *Progress note completed on 9th visit   PT Start Time 1018    PT Stop Time 1058    PT Time Calculation (min) 40 min    Equipment Utilized During Treatment Gait belt    Activity Tolerance Patient tolerated treatment well    Behavior During Therapy WFL for tasks assessed/performed             Past Medical History:  Diagnosis Date   Acute encephalopathy 05/08/2019   Allergic rhinitis, seasonal 12/04/2011   Arthritis of hand 11/08/2020   BPH (benign prostatic hyperplasia)    Bronchitis    Cataract    Chronic anticoagulation 12/21/2017   CHADS VASC=4, Eliquis stopped Sept 2021- recurrent falls with SDH   Chronic atrial fibrillation    On Eliquis   Chronic pain, legs and back 03/10/2017   Preferred pain management Leg and back pain   Compression fracture of thoracic vertebra 09/21/2017   COPD (chronic obstructive pulmonary disease)    2 liters O2 HS   Degeneration of thoracic intervertebral disc 09/21/2017   Degenerative joint disease of hand 07/14/2017   Dupuytren's disease of palm 07/12/2019   Essential hypertension 12/04/2011   Fatty tumor    waste and back   Fibromyalgia    Generalized anxiety disorder 06/12/2020   12/21 He is reporting severe insomnia and anxiety symptoms.  Options are limited.  We will try a low-dose lorazepam at night and during the day for anxiety and panic attacks.  He will stop lorazepam if problems.  He will stop drinking alcohol.   Discontinue BuSpar.  Reduce Cymbalta to 1 a day.   GERD (gastroesophageal reflux disease)    Glaucoma 03/08/2016   Gout 03/08/2016   Hereditary and idiopathic peripheral neuropathy 08/28/2015   History of COVID-19 05/08/2019   2021 Post-COVID sx's -" brain fog" Try Lion's mane supplement and B complex with niacin for neuropathy   Hyperlipidemia 03/10/2017   Hypothyroidism    Hypoxia    Insomnia    Leukocytosis 03/14/2020   Major depressive disorder 09/07/2016   Obstructive sleep apnea    Osteoarthritis    Osteoporosis    Recurrent falls 03/25/2020   PT. Treat neuropathy, insomnia.  Reduce Cymbalta to 1 a day.  Discontinue BuSpar.   Rotator cuff arthropathy of left shoulder 07/21/2019   SDH (subdural hematoma) 03/21/2020   Recurrent SDH secondary to falls at home- Sept 2019 and again 03/14/2020- Eliquis stopped.   Sebaceous cyst 06/27/2019   Spinal compression fracture seventh vertebre   Spondylolisthesis 09/07/2016   On fosamax - for about one year - Dr Alyson Ingles  10/14/17 dexa: normal dexa -- spine 2.1,   RFN -0.9,  LFN   -0.7   - no comparison on file, previous fracture      Thoracic back pain 08/10/2017   Thrombocytopenia 12/20/2020   TIA (transient ischemic attack) 08/28/2015   Type 2 diabetes mellitus 03/08/2016  Past Surgical History:  Procedure Laterality Date   APPENDECTOMY  age 82   CARPAL TUNNEL RELEASE Right    early 2000s   CATARACT EXTRACTION     bilateral   CHOLECYSTECTOMY  age 33   EYE SURGERY     FOOT ARTHRODESIS Right 02/02/2013   Procedure: RIGHT HALLUX METATARSAL PHALANGEAL JOINT ARTHRODESIS ;  Surgeon: Wylene Simmer, MD;  Location: [facility];  Service: Orthopedics;  Laterality: Right;   INGUINAL HERNIA REPAIR  age 24   rt side   NASAL CONCHA BULLOSA RESECTION  age 26   PROSTATE SURGERY     SHOULDER ARTHROSCOPY W/ ROTATOR CUFF REPAIR Right    early 2000s   tonsil     VASECTOMY  age 64    There were no vitals filed for this visit.    Subjective Assessment - 07/15/21 1016     Subjective Was able to double his walk outside yesterday- total of about 22 minutes. Probably could have done it one more time but he thought he might be overly tired. Cardioversion was cancelled d/t not having a-fib at the time.    Pertinent History PMH: pAF on Eliquis (to see cardiologist on 04/23/21), chronic pain on oxycodone and Ambien, previous SDH, COPD on home O2 noct, HTN, hypothyroidism, OSA on CPAP, and DM    Patient Stated Goals Pt's goal for therapy is to improve balance, strengthening, and walking.    Currently in Pain? Yes    Pain Score 2     Pain Location Back    Pain Orientation Lower    Pain Descriptors / Indicators Aching    Pain Type Chronic pain                               [facility] Adult PT Treatment/Exercise - 07/15/21 0001       Neuro Re-ed    Neuro Re-ed Details  backwards walking with cues to increase control, decrease speed 4x50ft, side stepping and backwards stepping over hurdle 10x; standing wide on foam + R/L multidirectional reaching to 1/2 foam      Knee/Hip Exercises: Aerobic   Nustep Level 5, UEs/LEs  x 6 minutes for leg strength, flexibility.      Knee/Hip Exercises: Standing   Gait Training gait + R/L turns   improved continuity and decreased L foot drag     Knee/Hip Exercises: Seated   Sit to Sand 10 reps;without UE support;2 sets   blue medball at chest; cues for glute contraction upon standing up                    PT Education - 07/15/21 1153     Education Details discussion on possible D/C in remaining appointment or recert d/t patient's subjective report of increased imbalance; encouraged SPC use outside as a result    Person(s) Educated Patient    Methods Explanation    Comprehension Verbalized understanding              PT Short Term Goals - 06/10/21 1210       PT SHORT TERM GOAL #1   Title Pt will be independent with progression of HEP for improved  strength, balance, transfers, and gait.  TARGET 07/11/2021    Time 4    Period Weeks    Status Revised      PT SHORT TERM GOAL #2   Title Pt will improve 5x sit<>stand to  less than or equal to 11.5 sec to demonstrate improved functional strength and transfer efficiency.    Baseline 12.38 sec with decreased forward lean to initiate; 05/13/21:  15.47 sec, 2nd trial 12.38 sec    Time 4    Period Weeks    Status On-going               PT Long Term Goals - 06/10/21 1212       PT LONG TERM GOAL #1   Title Pt will be independent with final HEP for improved strength, balance, transfers, and gait.  TARGET 07/25/2021    Time 6    Period Weeks    Status On-going      PT LONG TERM GOAL #2   Title Pt will improve FGA score to at least 24/30 to decrease fall risk.    Baseline 15/24; 21/24 06/10/2021    Time 6    Status Revised      PT LONG TERM GOAL #3   Title Patient to complete 412m on 6 minute walk test with LRAD. (>1300 ft)    Baseline 1206 ft    Time 6    Period Weeks    Status Revised      PT LONG TERM GOAL #4   Title Pt will verbalize plans for continued community fitness upon d/c from PT to maximize gains made in therapy.    Time 6    Period Weeks    Status On-going                   Plan - 07/15/21 1154     Clinical Impression Statement Patient arrived to session with report of having his cardioversion cancelled d/t not having a-fib at the time of arrival. Reports increased walking tolerance. STS transfers with added resisted required cues to improve eccentric lower and glute contraction upon standing up. Cueing required to slow down movements today and improve foot placement before stepping over obstacles d/t poor proprioception. Improved continuity and decreased L foot drag with turns during gait evident today. Patient tolerated session well and without complaints at end of session.    Comorbidities PMH: pAF on Eliquis, chronic pain on oxycodone and Ambien,  previous SDH, COPD on home O2 noct, HTN, hypothyroidism, OSA on CPAP, and DM, peripheral neuropathy    PT Frequency 1x / week    PT Duration 8 weeks    PT Treatment/Interventions ADLs/Self Care Home Management;Gait training;Functional mobility training;Therapeutic activities;Therapeutic exercise;Balance training;Neuromuscular re-education;Patient/family education    PT Next Visit Plan continue turning activities, vestibular training for balance, lateral weigthshifting and hip strengthening, gait with head turns/nods and compliant surfaces.  Try resisted gait and side step activities for balance recovery    Consulted and Agree with Plan of Care Patient             Patient will benefit from skilled therapeutic intervention in order to improve the following deficits and impairments:  Abnormal gait, Difficulty walking, Decreased balance, Decreased mobility, Decreased strength  Visit Diagnosis: Unsteadiness on feet  Muscle weakness (generalized)  Other abnormalities of gait and mobility     Problem List Patient Active Problem List   Diagnosis Date Noted   Aortic atherosclerosis ([place]) 02/04/2021   Mild cognitive impairment 01/04/2021   Fatty liver 12/29/2020   Thrombocytopenia 12/20/2020   Urinary frequency 12/20/2020   Arthritis of hand 11/08/2020   Generalized anxiety disorder 06/12/2020   Recurrent falls 03/25/2020   History of SDH 03/21/2020   Rotator cuff  arthropathy of left shoulder 07/21/2019   Dupuytren's disease of palm 07/12/2019   Sebaceous cyst 06/27/2019   History of COVID-19 05/08/2019   Obstructive sleep apnea    Asymmetrical left sensorineural hearing loss 07/12/2018   Chronic anticoagulation 12/21/2017   Degeneration of thoracic intervertebral disc 09/21/2017   Thoracic back pain 08/10/2017   Degenerative joint disease of hand 07/14/2017   Osteoarthritis    Fibromyalgia    Fatty tumor    BPH (benign prostatic hyperplasia)    Chronic atrial fibrillation     Insomnia 03/10/2017   Chronic pain, legs and back 03/10/2017   Hyperlipidemia 03/10/2017   Obesity (BMI 30.0-34.9) 12/08/2016   Depression 09/07/2016   Spondylolisthesis 09/07/2016   Hypothyroidism 03/08/2016   GERD (gastroesophageal reflux disease) 03/08/2016   Gout 03/08/2016   Glaucoma 03/08/2016   Type 2 diabetes mellitus 03/08/2016   Hereditary and idiopathic peripheral neuropathy 08/28/2015   History of TIA 08/28/2015   Essential hypertension 12/04/2011   Allergic rhinitis, seasonal 12/04/2011   COPD (chronic obstructive pulmonary disease)  07/18/2011    Janene Harvey, PT, DPT 07/15/21 11:56 AM   [facility] [address], [place], [name], [address] Phone: [phone]   Fax:  [phone]  Name: Jonathan [name] MRN: [id number] Date of Birth: Jun 20, 1932

## 2021-07-15 NOTE — Progress Notes (Signed)
[facility] Patient Consult   Requesting MD: Binnie Rail, Md [place],  Jonathan [name] [id number]   Jonathan [name] 88 y.o.  [birth year]    Reason for Consult: Thrombocytopenia   HPI: History heart is referred for evaluation of thrombocytopenia. A CBC on 07/09/2021 found the hemoglobin at 14.2, WBC 8.4, platelets 74,000, ANC 3.8, absolute site count 1.5, and absolute monocyte count 3.0. He reports a history of thrombocytopenia for many years.  Review of the medical record today reveals mild thrombocytopenia dating back to February 2013.  There is also chronic mild elevation of the monocyte count.  He bruises easily.  No other bleeding.   Past Medical History:  Diagnosis Date   Acute encephalopathy 05/08/2019   Allergic rhinitis, seasonal 12/04/2011   Arthritis of hand 11/08/2020   BPH (benign prostatic hyperplasia)    Bronchitis    Cataract    Chronic anticoagulation 12/21/2017   CHADS VASC=4, Eliquis stopped Sept 2021- recurrent falls with SDH   Chronic atrial fibrillation    On Eliquis   Chronic pain, legs and back 03/10/2017   Preferred pain management Leg and back pain   Compression fracture of thoracic vertebra 09/21/2017   COPD (chronic obstructive pulmonary disease)    2 liters O2 HS   Degeneration of thoracic intervertebral disc 09/21/2017   Degenerative joint disease of hand 07/14/2017   Dupuytren's disease of palm 07/12/2019   Essential hypertension 12/04/2011   Fatty tumor    waste and back   Fibromyalgia    Generalized anxiety disorder 06/12/2020   12/21 He is reporting severe insomnia and anxiety symptoms.  Options are limited.  We will try a low-dose lorazepam at night and during the day for anxiety and panic attacks.  He will stop lorazepam if problems.  He will stop drinking alcohol.  Discontinue BuSpar.  Reduce Cymbalta to 1 a day.   GERD (gastroesophageal reflux disease)    Glaucoma 03/08/2016   Gout 03/08/2016    Hereditary and idiopathic peripheral neuropathy 08/28/2015   History of COVID-19 05/08/2019   2021 Post-COVID sx's -" brain fog" Try Lion's mane supplement and B complex with niacin for neuropathy   Hyperlipidemia 03/10/2017   Hypothyroidism    Hypoxia    Insomnia    Leukocytosis 03/14/2020   Major depressive disorder 09/07/2016   Obstructive sleep apnea    Osteoarthritis    Osteoporosis    Recurrent falls 03/25/2020   PT. Treat neuropathy, insomnia.  Reduce Cymbalta to 1 a day.  Discontinue BuSpar.   Rotator cuff arthropathy of left shoulder 07/21/2019   SDH (subdural hematoma) 03/21/2020   Recurrent SDH secondary to falls at home- Sept 2019 and again 03/14/2020- Eliquis stopped.   Sebaceous cyst 06/27/2019   Spinal compression fracture seventh vertebre   Spondylolisthesis 09/07/2016   On fosamax - for about one year - Dr Alyson Ingles  10/14/17 dexa: normal dexa -- spine 2.1,   RFN -0.9,  LFN   -0.7   - no comparison on file, previous fracture      Thoracic back pain 08/10/2017   Thrombocytopenia 12/20/2020   TIA (transient ischemic attack) 08/28/2015   Type 2 diabetes mellitus 03/08/2016    Past Surgical History:  Procedure Laterality Date   APPENDECTOMY  age 54   [place] Right    early 2000s   CATARACT EXTRACTION     bilateral   CHOLECYSTECTOMY  age 31   [place]  Right 02/02/2013   Procedure: RIGHT HALLUX METATARSAL PHALANGEAL JOINT ARTHRODESIS ;  Surgeon: Wylene Simmer, MD;  Location: [facility];  Service: Orthopedics;  Laterality: Right;   INGUINAL HERNIA REPAIR  age 43   rt side   NASAL CONCHA BULLOSA RESECTION  age 27   PROSTATE SURGERY     SHOULDER ARTHROSCOPY W/ ROTATOR CUFF REPAIR Right    early 2000s   tonsil     VASECTOMY  age 39    .  Left knee meniscus surgery  Medications: Reviewed  Allergies:  Allergies  Allergen Reactions   Other Other (See Comments)    DUST-Other reaction(s): Respiratory distress     Family history: His father had prostate, lung, and renal cell cancer.  His mother had lung cancer.  No family history of a hematologic disorder.    Social History:   He lives with his wife in [place].  He is retired Management consultant.  He worked with addiction diagnoses.  No transfusion history.  He has been a blood donor.  No risk factor for HIV or hepatitis.  He quit cigarettes in 1977.  He reports 1.5 ounces of alcohol each night.  No history of heavy alcohol use.  ROS:   Positives include: Bladder spasms, frequent bowel movements for the past few months, easy bruising, nodular lesion between the anus and scrotum, joint and muscle pain since age 36  A complete ROS was otherwise negative.  Physical Exam:  Blood pressure 139/63, pulse 69, temperature 97.7 F (36.5 C), temperature source Oral, resp. rate 18, height 5' 6.5" (1.689 m), weight 176 lb 3.2 oz (79.9 kg), SpO2 95 %.  HEENT: Oropharynx without visible mass Lungs: Coarse rhonchi at the posterior chest bilaterally, no respiratory distress Cardiac: Regular rate and rhythm Abdomen: No hepatosplenomegaly  Vascular: No leg edema Lymph nodes: No cervical, supraclavicular, axillary, or inguinal nodes Neurologic: Alert and oriented, the motor exam appears intact in the upper and lower extremities bilaterally Skin: No rash, mild brown discoloration at the pretibial area bilaterally, 4-5 mm round cutaneous cyst at the perineum Musculoskeletal: No spine tenderness   LAB:  CBC  Lab Results  Component Value Date   WBC 7.4 07/15/2021   HGB 13.8 07/15/2021   HCT 43.8 07/15/2021   MCV 89.4 07/15/2021   PLT 97 (L) 07/15/2021   NEUTROABS 3.4 07/15/2021      Blood smear: The platelets appear mildly decreased, no large platelet clumps.  Few ovalocytes, teardrops, and target cells.  No nucleated red cells.  The polychromasia is not increased.  Majority the white cells are mature neutrophils and lymphocytes.  Mild increase in  monocytes.  No blasts or other young forms are seen.  CMP  Lab Results  Component Value Date   NA 145 (H) 07/01/2021   K 4.3 07/01/2021   CL 106 07/01/2021   CO2 23 07/01/2021   GLUCOSE 108 (H) 07/01/2021   BUN 13 07/01/2021   CREATININE 0.92 07/01/2021   CALCIUM 9.5 07/01/2021   PROT 6.2 07/01/2021   ALBUMIN 4.5 07/01/2021   AST 16 07/01/2021   ALT 16 07/01/2021   ALKPHOS 97 07/01/2021   BILITOT 0.3 07/01/2021   GFRNONAA >60 03/14/2020   GFRAA >60 03/14/2020     Imaging: Ultrasound abdomen 12/24/2020-hepatic steatosis, no splenomegaly, patent portal vein with normal direction of flow    Assessment/Plan:   Thrombocytopenia-chronic  Atrial fibrillation-maintained on anticoagulation Hypertension Fibromyalgia Diabetes Hypothyroidism Anxiety Gout Neuropathy Sleep apnea-CPAP Glaucoma Mild elevation of monocyte count Small  cutaneous cyst at the perineum  Disposition:   Mr. Groeneveld and is referred for evaluation of thrombocytopenia.  Review of the history, physical examination, and peripheral blood smear reveals no obvious explanation for the thrombocytopenia.  The differential diagnosis includes chronic ITP, unrecognized chronic liver disease, and early myelodysplasia.  The chronic mild monocytosis could indicate an early myeloproliferative disorder.  I cannot relate the thrombocytopenia to his current medical regimen.  We obtained a myeloma panel today.  I will recommend additional diagnostic evaluation with peripheral blood flow cytometry and a bone marrow biopsy if he develops progressive thrombocytopenia or a new hematologic abnormality.  He will return for an office visit and CBC in 4 months.  Betsy Coder, MD  07/15/2021, 3:10 PM

## 2021-07-15 NOTE — Therapy (Signed)
[facility] [address], [place], [name], [address] Phone: [phone]   Fax:  [phone]  Speech Language Pathology Treatment  Patient Details  Name: Jonathan [name] MRN: [id number] Date of Birth: 08/05/1932 Referring Provider (SLP): Lazaro Arms, NP   Encounter Date: 07/15/2021   End of Session - 07/15/21 1125     Visit Number 8    Number of Visits 9    Date for SLP Re-Evaluation 08/08/21    SLP Start Time 0933    SLP Stop Time  2542    SLP Time Calculation (min) 42 min    Activity Tolerance Patient tolerated treatment well             Past Medical History:  Diagnosis Date   Acute encephalopathy 05/08/2019   Allergic rhinitis, seasonal 12/04/2011   Arthritis of hand 11/08/2020   BPH (benign prostatic hyperplasia)    Bronchitis    Cataract    Chronic anticoagulation 12/21/2017   CHADS VASC=4, Eliquis stopped Sept 2021- recurrent falls with SDH   Chronic atrial fibrillation    On Eliquis   Chronic pain, legs and back 03/10/2017   Preferred pain management Leg and back pain   Compression fracture of thoracic vertebra 09/21/2017   COPD (chronic obstructive pulmonary disease)    2 liters O2 HS   Degeneration of thoracic intervertebral disc 09/21/2017   Degenerative joint disease of hand 07/14/2017   Dupuytren's disease of palm 07/12/2019   Essential hypertension 12/04/2011   Fatty tumor    waste and back   Fibromyalgia    Generalized anxiety disorder 06/12/2020   12/21 He is reporting severe insomnia and anxiety symptoms.  Options are limited.  We will try a low-dose lorazepam at night and during the day for anxiety and panic attacks.  He will stop lorazepam if problems.  He will stop drinking alcohol.  Discontinue BuSpar.  Reduce Cymbalta to 1 a day.   GERD (gastroesophageal reflux disease)    Glaucoma 03/08/2016   Gout 03/08/2016   Hereditary and idiopathic peripheral neuropathy 08/28/2015   History of  COVID-19 05/08/2019   2021 Post-COVID sx's -" brain fog" Try Lion's mane supplement and B complex with niacin for neuropathy   Hyperlipidemia 03/10/2017   Hypothyroidism    Hypoxia    Insomnia    Leukocytosis 03/14/2020   Major depressive disorder 09/07/2016   Obstructive sleep apnea    Osteoarthritis    Osteoporosis    Recurrent falls 03/25/2020   PT. Treat neuropathy, insomnia.  Reduce Cymbalta to 1 a day.  Discontinue BuSpar.   Rotator cuff arthropathy of left shoulder 07/21/2019   SDH (subdural hematoma) 03/21/2020   Recurrent SDH secondary to falls at home- Sept 2019 and again 03/14/2020- Eliquis stopped.   Sebaceous cyst 06/27/2019   Spinal compression fracture seventh vertebre   Spondylolisthesis 09/07/2016   On fosamax - for about one year - Dr Alyson Ingles  10/14/17 dexa: normal dexa -- spine 2.1,   RFN -0.9,  LFN   -0.7   - no comparison on file, previous fracture      Thoracic back pain 08/10/2017   Thrombocytopenia 12/20/2020   TIA (transient ischemic attack) 08/28/2015   Type 2 diabetes mellitus 03/08/2016    Past Surgical History:  Procedure Laterality Date   APPENDECTOMY  age 73   CARPAL TUNNEL RELEASE Right    early 2000s   CATARACT EXTRACTION     bilateral   CHOLECYSTECTOMY  age 60  EYE SURGERY     FOOT ARTHRODESIS Right 02/02/2013   Procedure: RIGHT HALLUX METATARSAL PHALANGEAL JOINT ARTHRODESIS ;  Surgeon: Wylene Simmer, MD;  Location: [facility];  Service: Orthopedics;  Laterality: Right;   INGUINAL HERNIA REPAIR  age 4   rt side   NASAL CONCHA BULLOSA RESECTION  age 37   PROSTATE SURGERY     SHOULDER ARTHROSCOPY W/ ROTATOR CUFF REPAIR Right    early 2000s   tonsil     VASECTOMY  age 44    There were no vitals filed for this visit.   Subjective Assessment - 07/15/21 0939     Subjective Pt arrived on time.    Currently in Pain? Yes    Pain Score 2     Pain Location Back    Pain Orientation Lower    Pain Descriptors / Indicators Aching     Pain Type Chronic pain    Pain Onset More than a month ago    Pain Frequency Constant                   ADULT SLP TREATMENT - 07/15/21 0945       General Information   Behavior/Cognition Alert;Cooperative;Pleasant mood      Treatment Provided   Treatment provided Cognitive-Linquistic      Cognitive-Linquistic Treatment   Treatment focused on Cognition    Skilled Treatment Pt brought in his consolidated to do list containing approx 30 items. SLP suggested plugging them into his calendar in order to get them done. Pt acknowledged he needed to complete some of these tasks. Jonathan [name] said somethign that is difficult for him is if he is going somewhere in the house he gets sidetracked by the other urgent thing and continues to find "other things" so that his origainal task is never completed (decr'd alternating attention). SLP suggested compensation of setting reminder or a timer. the remainder of the session was assisting pt in setting up his apple watch to have [employer] be able to accomodate this. Pt did search independently for apple support forums how to accomplish this, and did so by session end. "Thanks, that should help a lot, Jonathan [name]." pt stated at end of session.      Assessment / Recommendations / Plan   Plan Continue with current plan of care      Progression Toward Goals   Progression toward goals Progressing toward goals              SLP Education - 07/15/21 1125     Education Details setting reminders and timers for attention/memory compensation - [employer] can help with this    Person(s) Educated Patient    Methods Explanation;Demonstration    Comprehension Verbalized understanding;Returned demonstration;Need further instruction              SLP Short Term Goals - 06/12/21 1323       SLP SHORT TERM GOAL #1   Title pt will undergo cognitive linguistic testing and goals added PRN    Status Achieved    Target Date 05/23/21      SLP SHORT TERM GOAL #2   Title pt  will report using 3 compensations for attention between 2 sessions    Baseline 06-03-21    Time 4    Period Weeks    Status Not Met   and ongoing   Target Date 06/13/21              SLP Long Term Goals -  07/15/21 [place] #1   Title pt will demo independence with both semantic feature analysis (SFA) and verb network strengthening treatment (VNeST)    Time 1    Period --   sessions   Status On-going   and ongoing     SLP LONG TERM GOAL #2   Title pt will report that he feels more confident than prior to ST with his conversational ability    Time 1    Period --   sessions   Status Deferred      SLP LONG TERM GOAL #3   Title pt will report using 3 compensations for attention between 2 sessions    Baseline 07-15-21    Time 6    Period Weeks    Status On-going    Target Date 08/08/21      SLP LONG TERM GOAL #4   Title pt will catch errors made on more functional detailed tasks in 3 sessions using compensations    Baseline 07-08-21 (calendar item), 07-15-21 (reminder)    Time 4    Period Weeks    Status On-going    Target Date 08/08/21      SLP LONG TERM GOAL #5   Title pt will score better on cognitive QOL measure than on 05-19-21    Time 4    Period Weeks    Status On-going    Target Date 08/08/21              Plan - 07/15/21 1126     Clinical Impression Statement Pt complained of decr'd motivation. Pt organized his to-do lists and showed this to SLP this session. Today SLP assisted pt with setting up reminders and timers with Siri. He will benefit from cont'd skilled ST targeting word finding and these cognitive linguistic deficits. Likely d/c next session.    Speech Therapy Frequency 1x /week   every other week   Duration --   x6 visits   Treatment/Interventions SLP instruction and feedback;Patient/family education;Compensatory techniques;Language facilitation;Multimodal communcation approach    Potential to Achieve Goals Good    SLP Home  Exercise Plan semantic feature analysis    Consulted and Agree with Plan of Care Patient             Patient will benefit from skilled therapeutic intervention in order to improve the following deficits and impairments:   Cognitive communication deficit  Aphasia    Problem List Patient Active Problem List   Diagnosis Date Noted   Aortic atherosclerosis ([place]) 02/04/2021   Mild cognitive impairment 01/04/2021   Fatty liver 12/29/2020   Thrombocytopenia 12/20/2020   Urinary frequency 12/20/2020   Arthritis of hand 11/08/2020   Generalized anxiety disorder 06/12/2020   Recurrent falls 03/25/2020   History of SDH 03/21/2020   Rotator cuff arthropathy of left shoulder 07/21/2019   Dupuytren's disease of palm 07/12/2019   Sebaceous cyst 06/27/2019   History of COVID-19 05/08/2019   Obstructive sleep apnea    Asymmetrical left sensorineural hearing loss 07/12/2018   Chronic anticoagulation 12/21/2017   Degeneration of thoracic intervertebral disc 09/21/2017   Thoracic back pain 08/10/2017   Degenerative joint disease of hand 07/14/2017   Osteoarthritis    Fibromyalgia    Fatty tumor    BPH (benign prostatic hyperplasia)    Chronic atrial fibrillation    Insomnia 03/10/2017   Chronic pain, legs and back 03/10/2017   Hyperlipidemia 03/10/2017   Obesity (BMI 30.0-34.9) 12/08/2016  Depression 09/07/2016   Spondylolisthesis 09/07/2016   Hypothyroidism 03/08/2016   GERD (gastroesophageal reflux disease) 03/08/2016   Gout 03/08/2016   Glaucoma 03/08/2016   Type 2 diabetes mellitus 03/08/2016   Hereditary and idiopathic peripheral neuropathy 08/28/2015   History of TIA 08/28/2015   Essential hypertension 12/04/2011   Allergic rhinitis, seasonal 12/04/2011   COPD (chronic obstructive pulmonary disease)  07/18/2011    Garald Balding, [place] 07/15/2021, 11:29 AM  [facility] [address], [place], [name], [address] Phone:  [phone]   Fax:  [phone]   Name: Keyvon Herter MRN: [id number] Date of Birth: May 07, 1933

## 2021-07-15 NOTE — Progress Notes (Signed)
- Cardiology Office Note:    Date:  07/16/2021   ID:  Jonathan [name], DOB 07/10/1932, MRN [id number]  PCP:  Binnie Rail, MD   [facility] Providers Cardiologist:  Kirk Ruths, MD     Referring MD: Binnie Rail, MD   Chief Complaint: follow-up PAF  History of Present Illness:    Jonathan [name] is a 88 y.o. male with a hx of aortic atherosclerosis, paroxysmal atrial fib on chronic anticoagulaiton, HTN, TIA, COPD, GERD, diabetes, mild to moderate MR, and ideopathic thrombocytopenia, and fibromyalgia.  He has been followed primarily for PAF. A myoview in 2016 was low risk with no evidence of ischemia. He has worn multiple monitors over the past few years, most recently in 12/21, that revealed PACs, PVCs, brief episodes of NSVT, PAT, an PAF. Anticoagulation was temporarily stopped due to recurrent SDH in  2019 and again in 2021 following falls. Anticoagulation was ultimately restarted given prior history of TIA.Discussion with Dr. Stanford Breed on 04/23/21 regarding rate control vs rhythm control and given that he was asymptomatic, decision was made to focus on rate control with plans for antiarrhythmic followed by cardioversion should he become symptomatic. Echo revealed LVEF 60-65% with normal wall motion and normal diastolic parameters, normal RV, and mild to moderate MR.   He was last seen by Sande Rives, PA on 06/17/21 at which time he was feeling more fatigued than at prior visit. He felt that his symptoms were due to atrial fibrillation. His Apple watch revealed HR normally in the 60s, but occasionally in 90s to 110s. He reported very rare episodes of of very high heart rates. He wished to attempt restoration of sinus rhythm so he was loaded with amiodarone and scheduled for cardioversion.   On 07/10/21, cardioversion was cancelled due to sinus rhythm restored. He returns today for follow-up. He is here alone and reports only aware he's in a fib when pulse is increased on watch or on  home monitor. Some periods of total exhaustion. Feeling unsteady on feet when just moving around the house. Constant dyspnea on exertion that has not increased recently. Trace edema noted at end of the day that resolves overnight. No orthopnea or PND. Reports exhaustion is not new but feels that unsteadiness has gotten worse. He is walking 0.5 miles or a little more 3 times per week without stopping. At times feels he can walk more, while other times feels too tired to walk more. Notes weakness when trying to open a jar. He denies chest pain, palpitations, melena, hematuria, hemoptysis, diaphoresis, presyncope, syncope, orthopnea, and PND. Home SBP readings consistently around 135 mmHg. Considerable stress with taking care of wife who is almost completely disable and preparing to move into [facility].   Past Medical History:  Diagnosis Date   Acute encephalopathy 05/08/2019   Allergic rhinitis, seasonal 12/04/2011   Arthritis of hand 11/08/2020   BPH (benign prostatic hyperplasia)    Bronchitis    Cataract    Chronic anticoagulation 12/21/2017   CHADS VASC=4, Eliquis stopped Sept 2021- recurrent falls with SDH   Chronic atrial fibrillation    On Eliquis   Chronic pain, legs and back 03/10/2017   Preferred pain management Leg and back pain   Compression fracture of thoracic vertebra 09/21/2017   COPD (chronic obstructive pulmonary disease)    2 liters O2 HS   Degeneration of thoracic intervertebral disc 09/21/2017   Degenerative joint disease of hand 07/14/2017   Dupuytren's disease of palm 07/12/2019   Essential  hypertension 12/04/2011   Fatty tumor    waste and back   Fibromyalgia    Generalized anxiety disorder 06/12/2020   12/21 He is reporting severe insomnia and anxiety symptoms.  Options are limited.  We will try a low-dose lorazepam at night and during the day for anxiety and panic attacks.  He will stop lorazepam if problems.  He will stop drinking alcohol.  Discontinue BuSpar.   Reduce Cymbalta to 1 a day.   GERD (gastroesophageal reflux disease)    Glaucoma 03/08/2016   Gout 03/08/2016   Hereditary and idiopathic peripheral neuropathy 08/28/2015   History of COVID-19 05/08/2019   2021 Post-COVID sx's -" brain fog" Try Lion's mane supplement and B complex with niacin for neuropathy   Hyperlipidemia 03/10/2017   Hypothyroidism    Hypoxia    Insomnia    Leukocytosis 03/14/2020   Major depressive disorder 09/07/2016   Obstructive sleep apnea    Osteoarthritis    Osteoporosis    Recurrent falls 03/25/2020   PT. Treat neuropathy, insomnia.  Reduce Cymbalta to 1 a day.  Discontinue BuSpar.   Rotator cuff arthropathy of left shoulder 07/21/2019   SDH (subdural hematoma) 03/21/2020   Recurrent SDH secondary to falls at home- Sept 2019 and again 03/14/2020- Eliquis stopped.   Sebaceous cyst 06/27/2019   Spinal compression fracture seventh vertebre   Spondylolisthesis 09/07/2016   On fosamax - for about one year - Dr Alyson Ingles  10/14/17 dexa: normal dexa -- spine 2.1,   RFN -0.9,  LFN   -0.7   - no comparison on file, previous fracture      Thoracic back pain 08/10/2017   Thrombocytopenia 12/20/2020   TIA (transient ischemic attack) 08/28/2015   Type 2 diabetes mellitus 03/08/2016    Past Surgical History:  Procedure Laterality Date   APPENDECTOMY  age 12   [place] Right    early 2000s   CATARACT EXTRACTION     bilateral   CHOLECYSTECTOMY  age 89   EYE SURGERY     FOOT ARTHRODESIS Right 02/02/2013   Procedure: RIGHT HALLUX METATARSAL PHALANGEAL JOINT ARTHRODESIS ;  Surgeon: Wylene Simmer, MD;  Location: [facility];  Service: Orthopedics;  Laterality: Right;   INGUINAL HERNIA REPAIR  age 51   rt side   NASAL CONCHA BULLOSA RESECTION  age 81   PROSTATE SURGERY     SHOULDER ARTHROSCOPY W/ ROTATOR CUFF REPAIR Right    early 2000s   tonsil     VASECTOMY  age 45    Current Medications: Current Meds  Medication Sig   acetaminophen  (TYLENOL) 650 MG CR tablet Take 1,300 mg by mouth in the morning and at bedtime.   acyclovir ointment (ZOVIRAX) 5 % Apply 1 application topically as needed.   allopurinol (ZYLOPRIM) 300 MG tablet Take 1 tablet (300 mg total) by mouth every evening. For gout   atorvastatin (LIPITOR) 20 MG tablet Take 1 tablet (20 mg total) by mouth daily. For cholesterol   Calcium Citrate-Vitamin D (CALCIUM + D PO) Take 1 tablet by mouth daily.   carboxymethylcellulose (REFRESH PLUS) 0.5 % SOLN Place 1 drop into both eyes 3 (three) times daily as needed (dry eyes).   clobetasol (TEMOVATE) 0.05 % external solution Apply 1 application topically 2 (two) times daily as needed (rash).   Continuous Blood Gluc Receiver (FREESTYLE LIBRE 14 DAY READER) DEVI UAD to check sugars.  E11.65   Continuous Blood Gluc Sensor (FREESTYLE LIBRE 14 DAY SENSOR) MISC UAD  to check sugars, E11.65   dapagliflozin propanediol (FARXIGA) 10 MG TABS tablet Take 1 tablet (10 mg total) by mouth daily before breakfast. For diabetes   diltiazem (CARDIZEM CD) 300 MG 24 hr capsule TAKE ONE CAPSULE BY MOUTH DAILY   diltiazem (CARDIZEM) 30 MG tablet Take 1 tablet (30 mg total) by mouth 2 (two) times daily as needed (sustained elevated heart rates >100 bpm for > 20 minutes.).   ELIQUIS 5 MG TABS tablet TAKE ONE TABLET BY MOUTH TWICE A DAY   Emollient (RA RENEWAL DARK SPOT CORRECTOR EX) Apply 1 application topically daily as needed (dark spots).   FLUoxetine (PROZAC) 40 MG capsule Take 1 capsule (40 mg total) by mouth daily. For depression   Fluticasone Propionate (ALLERGY RELIEF NA) Place 2 sprays into the nose daily as needed (allergies).   glucose blood test strip Use to check blood sugar daily. E11.9   hydrochlorothiazide (HYDRODIURIL) 12.5 MG tablet Take 1 tablet (12.5 mg total) by mouth daily.   Lancets MISC Use to check blood sugars daily. E11.9   latanoprost (XALATAN) 0.005 % ophthalmic solution Place 1 drop into both eyes at bedtime.    levothyroxine (SYNTHROID) 112 MCG tablet TAKE ONE TABLET BY MOUTH DAILY BEFORE BREAKFAST   Melatonin 5 MG TABS Take 5 mg by mouth at bedtime.   mometasone (NASONEX) 50 MCG/ACT nasal spray Place 2 sprays into the nose as needed (allergies).   Multiple Vitamin (MULTIVITAMIN) tablet Take 1 tablet by mouth daily.   nitroGLYCERIN (NITROSTAT) 0.4 MG SL tablet Place 1 tablet (0.4 mg total) under the tongue every 5 (five) minutes as needed for chest pain.   omeprazole (PRILOSEC) 20 MG capsule TAKE ONE CAPSULE BY MOUTH DAILY   oxyCODONE-acetaminophen (PERCOCET) 10-325 MG tablet Take 1 tablet by mouth every 8 (eight) hours as needed for pain.   pregabalin (LYRICA) 75 MG capsule Take 1 capsule (75 mg total) by mouth 2 (two) times daily.   Probiotic Product (PROBIOTIC PO) Take 1 capsule by mouth daily.   RESTASIS 0.05 % ophthalmic emulsion Place 1 drop into both eyes 2 (two) times daily.    Semaglutide (RYBELSUS) 7 MG TABS Take 7 mg by mouth daily. Via [employer] pt assistance   sildenafil (REVATIO) 20 MG tablet Take 40-100 mg by mouth daily as needed.   silodosin (RAPAFLO) 8 MG CAPS capsule Take 8 mg by mouth daily.   Vibegron (GEMTESA) 75 MG TABS Take 75 mg by mouth daily.   vitamin C (ASCORBIC ACID) 500 MG tablet Take 500 mg by mouth daily.   VITAMIN D, CHOLECALCIFEROL, PO Take 1 capsule by mouth daily.   [DISCONTINUED] amiodarone (PACERONE) 200 MG tablet Take 1 tablet (200 mg) by mouth 2 times a day for 2 weeks then take 1 tablet (200 mg) daily (Patient taking differently: Take 200 mg by mouth daily.)     Allergies:   Other   Social History   Socioeconomic History   Marital status: Married    Spouse name: Not on file   Number of children: 2   Years of education: 18   Highest education level: Master's degree (e.g., MA, MS, MEng, MEd, MSW, MBA)  Occupational History   Occupation: RETIRED    Employer: RETIRED  Tobacco Use   Smoking status: Former    Packs/day: 2.00    Years: 10.00    Pack  years: 20.00    Types: Cigarettes    Quit date: 07/18/1975    Years since quitting: 46.0  Smokeless tobacco: Never  Vaping Use   Vaping Use: Never used  Substance and Sexual Activity   Alcohol use: Yes    Comment: 1-2 drinks/day   Drug use: No   Sexual activity: Not Currently  Other Topics Concern   Not on file  Social History Narrative   Not on file   Social Determinants of Health   Financial Resource Strain: Not on file  Food Insecurity: Not on file  Transportation Needs: Not on file  Physical Activity: Not on file  Stress: Not on file  Social Connections: Not on file     Family History: The patient's family history includes Arthritis in his mother; Coronary artery disease in his brother; Dementia in his mother; Heart disease in his father; Kidney cancer in his father; Lung cancer in his father and mother; Prostate cancer in his father.  ROS:   Please see the history of present illness.    + weakness + fatigue All other systems reviewed and are negative.  Labs/Other Studies Reviewed:    The following studies were reviewed today:  Echo 05/07/21  Left Ventricle: Left ventricular ejection fraction, by estimation, is 60  to 65%. The left ventricle has normal function. The left ventricle has no  regional wall motion abnormalities. The left ventricular internal cavity  size was normal in size. There is  no left ventricular hypertrophy. Left ventricular diastolic parameters were normal.  Right Ventricle: The right ventricular size is normal. Right vetricular  wall thickness was not well visualized. Right ventricular systolic  function is normal.  Left Atrium: Left atrial size was normal in size.  Right Atrium: Right atrial size was normal in size.  Pericardium: There is no evidence of pericardial effusion.  Mitral Valve: The mitral valve is normal in structure. Mild to moderate  mitral valve regurgitation.  Tricuspid Valve: The tricuspid valve is normal in structure.  Tricuspid  valve regurgitation is mild.  Aortic Valve: The aortic valve is normal in structure. Aortic valve  regurgitation is not visualized.  Pulmonic Valve: The pulmonic valve was normal in structure. Pulmonic valve  regurgitation is trivial.  Aorta: The aortic root and ascending aorta are structurally normal, with  no evidence of dilitation.  IAS/Shunts: The atrial septum is grossly normal.   Cardiac monitor 12/21  Sinus rhythm with PVCs, PACs, PAF, PAT and 4 beats NSVT Kirk Ruths  Lexiscan myoview 7/16  Nuclear stress EF: 56%. The left ventricular ejection fraction is normal (55-65%). The study is normal. This is a low risk study.   Nl Lexiscan myoview     Recent Labs: 07/01/2021: ALT 16; BUN 13; Creatinine, Ser 0.92; Potassium 4.3; Sodium 145 07/09/2021: TSH 4.46 07/15/2021: Hemoglobin 13.8; Platelet Count 97  Recent Lipid Panel    Component Value Date/Time   CHOL 100 07/09/2021 0910   TRIG 126.0 07/09/2021 0910   HDL 39.20 07/09/2021 0910   CHOLHDL 3 07/09/2021 0910   VLDL 25.2 07/09/2021 0910   LDLCALC 36 07/09/2021 0910   LDLCALC 65 11/08/2020 1624   LDLDIRECT 51.0 08/14/2019 1028     Risk Assessment/Calculations:    CHA2DS2-VASc Score = 6  This indicates a 9.7% annual risk of stroke. The patient's score is based upon: CHF History: 0 HTN History: 1 Diabetes History: 1 Stroke History: 2 Vascular Disease History: 0 Age Score: 2 Gender Score: 0    Physical Exam:    VS:  BP (!) 152/64    Pulse (!) 56    Ht 5\' 6"  (1.676  m)    Wt 177 lb (80.3 kg)    BMI 28.57 kg/m     Wt Readings from Last 3 Encounters:  07/16/21 177 lb (80.3 kg)  07/15/21 176 lb 3.2 oz (79.9 kg)  07/09/21 176 lb (79.8 kg)     GEN:  Well nourished, well developed in no acute distress HEENT: Normal NECK: No JVD; No carotid bruits CARDIAC: RRR, no murmurs, rubs, gallops RESPIRATORY:  Clear to auscultation without rales, wheezing or rhonchi  ABDOMEN: Soft, non-tender,  non-distended MUSCULOSKELETAL:  No edema; No deformity. 2+ pedal pulses, equal bilaterally SKIN: Warm and dry NEUROLOGIC:  Alert and oriented x 3 PSYCHIATRIC:  Normal affect   EKG:  EKG is ordered today.  The ekg ordered today demonstrates sinus bradycardia at 56 bpm, septal infarct, age undertermined, no acute change from previous  Diagnoses:    1. Medication management   2. PAF (paroxysmal atrial fibrillation) ([facility])   3. Chronic anticoagulation   4. Fatigue, unspecified type   5. Essential hypertension   6. Mitral valve insufficiency, unspecified etiology   7. Idiopathic thrombocythemia (HCC)    Assessment and Plan:     PAF on chronic anticoagulation: Not aware when he is in A. fib.  Notices occasional spikes on his Apple watch that are not symptomatic. Heart rate stable.  Encouraged him to monitor at home and report back in 2 weeks. No bleeding concerns on Eliquis.  He would like to maintain rhythm control and is hopeful that this will improve fatigue. Continue amiodarone 200 mg once daily.  Will check TSH and LFTs in 2 weeks.   Fatigue: Seems to be chronic.  Age and stressful home situation likely contributing.  Able to walk half mile or more several times a week without stopping. Doubt angina equivalent since this appears stable and chronic. Would consider reduction in diltiazem if low HR felt to be contributing to fatigue. No indication for further ischemic evaluation at this time.  Frequent lab monitoring with hematology.  Essential hypertension: Blood pressure elevated today.  He reports SBP consistently 135 at home.  Asked him to continue to monitor and report back if SBP consistently > 140 mmHg. Continue diltiazem, HCTZ.   Mitral valve regurgitation: Mild to moderate MR by echo 11/22.  Do not appreciate murmur on exam today.  No symptoms of worsening valve function.  Will plan to monitor with repeat echo in 2 to 3 years unless symptoms warrant sooner evaluation.   Idiopathic  thrombocytopenia: Platelet count 97 on 07/15/21.  He denies bleeding concerns.  Management per hematology.     Disposition: 3 months with Dr. Stanford Breed   Medication Adjustments/Labs and Tests Ordered: Current medicines are reviewed at length with the patient today.  Concerns regarding medicines are outlined above.  Orders Placed This Encounter  Procedures   TSH   Comprehensive metabolic panel   EKG [id number]   Meds ordered this encounter  Medications   amiodarone (PACERONE) 200 MG tablet    Sig: Take 1 tablet (200 mg total) by mouth daily. Take 1 tablet (200 mg) by mouth 1 time daily    Dispense:  90 tablet    Refill:  3    Patient Instructions  Medication Instructions:  Your Physician recommend you continue on your current medication as directed.  *If you need a refill on your cardiac medications before your next appointment, please call your pharmacy*   Lab Work: Your physician recommends that you return for lab work in  2 weeks at  the [facility] for a TSH and CMET   If you have labs (blood work) drawn today and your tests are completely normal, you will receive your results only by: [facility] (if you have MyChart) OR A paper copy in the mail If you have any lab test that is abnormal or we need to change your treatment, we will call you to review the results.   Testing/Procedures: None ordered today    Follow-Up: At [facility], you and your health needs are our priority.  As part of our continuing mission to provide you with exceptional heart care, we have created designated Provider Care Teams.  These Care Teams include your primary Cardiologist (physician) and Advanced Practice Providers (APPs -  Physician Assistants and Nurse Practitioners) who all work together to provide you with the care you need, when you need it.  We recommend signing up for the patient portal called "MyChart".  Sign up information is provided on this After Visit Summary.  MyChart is  used to connect with patients for Virtual Visits (Telemedicine).  Patients are able to view lab/test results, encounter notes, upcoming appointments, etc.  Non-urgent messages can be sent to your provider as well.   To learn more about what you can do with MyChart, go to [email or web address].    Your next appointment:   Follow up as scheduled with Dr. Stanford Breed  Other Instructions Please check your blood pressure and heart rate daily. Please call our office if your Blood pressure is greater than 140 or your heart rate is less than 50!     Signed, Emmaline Life, NP  07/16/2021 12:19 PM    [facility]

## 2021-07-16 ENCOUNTER — Ambulatory Visit (INDEPENDENT_AMBULATORY_CARE_PROVIDER_SITE_OTHER): Payer: PPO | Admitting: Nurse Practitioner

## 2021-07-16 ENCOUNTER — Encounter (HOSPITAL_BASED_OUTPATIENT_CLINIC_OR_DEPARTMENT_OTHER): Payer: Self-pay | Admitting: Nurse Practitioner

## 2021-07-16 VITALS — BP 152/64 | HR 56 | Ht 66.0 in | Wt 177.0 lb

## 2021-07-16 DIAGNOSIS — Z7901 Long term (current) use of anticoagulants: Secondary | ICD-10-CM

## 2021-07-16 DIAGNOSIS — R5383 Other fatigue: Secondary | ICD-10-CM | POA: Diagnosis not present

## 2021-07-16 DIAGNOSIS — D473 Essential (hemorrhagic) thrombocythemia: Secondary | ICD-10-CM

## 2021-07-16 DIAGNOSIS — I340 Nonrheumatic mitral (valve) insufficiency: Secondary | ICD-10-CM | POA: Diagnosis not present

## 2021-07-16 DIAGNOSIS — I10 Essential (primary) hypertension: Secondary | ICD-10-CM

## 2021-07-16 DIAGNOSIS — Z79899 Other long term (current) drug therapy: Secondary | ICD-10-CM

## 2021-07-16 DIAGNOSIS — I480 Paroxysmal atrial fibrillation: Secondary | ICD-10-CM

## 2021-07-16 LAB — FERRITIN: Ferritin: 26 ng/mL (ref 24–336)

## 2021-07-16 MED ORDER — AMIODARONE HCL 200 MG PO TABS: 200.0000 mg | ORAL_TABLET | Freq: Every day | ORAL | 3 refills | Status: DC

## 2021-07-16 NOTE — Patient Instructions (Signed)
Medication Instructions:  Your Physician recommend you continue on your current medication as directed.  *If you need a refill on your cardiac medications before your next appointment, please call your pharmacy*   Lab Work: Your physician recommends that you return for lab work in  2 weeks at the [facility] for a TSH and CMET   If you have labs (blood work) drawn today and your tests are completely normal, you will receive your results only by: [employer] (if you have MyChart) OR A paper copy in the mail If you have any lab test that is abnormal or we need to change your treatment, we will call you to review the results.   Testing/Procedures: None ordered today    Follow-Up: At [facility], you and your health needs are our priority.  As part of our continuing mission to provide you with exceptional heart care, we have created designated Provider Care Teams.  These Care Teams include your primary Cardiologist (physician) and Advanced Practice Providers (APPs -  Physician Assistants and Nurse Practitioners) who all work together to provide you with the care you need, when you need it.  We recommend signing up for the patient portal called "MyChart".  Sign up information is provided on this After Visit Summary.  MyChart is used to connect with patients for Virtual Visits (Telemedicine).  Patients are able to view lab/test results, encounter notes, upcoming appointments, etc.  Non-urgent messages can be sent to your provider as well.   To learn more about what you can do with MyChart, go to [email or web address].    Your next appointment:   Follow up as scheduled with Dr. Stanford Breed  Other Instructions Please check your blood pressure and heart rate daily. Please call our office if your Blood pressure is greater than 140 or your heart rate is less than 50!

## 2021-07-17 ENCOUNTER — Telehealth: Payer: Self-pay

## 2021-07-17 ENCOUNTER — Other Ambulatory Visit: Payer: Self-pay

## 2021-07-17 DIAGNOSIS — D696 Thrombocytopenia, unspecified: Secondary | ICD-10-CM

## 2021-07-17 LAB — MULTIPLE MYELOMA PANEL, SERUM
Albumin SerPl Elph-Mcnc: 4.1 g/dL (ref 2.9–4.4)
Albumin/Glob SerPl: 1.8 — ABNORMAL HIGH (ref 0.7–1.7)
Alpha 1: 0.3 g/dL (ref 0.0–0.4)
Alpha2 Glob SerPl Elph-Mcnc: 0.9 g/dL (ref 0.4–1.0)
B-Globulin SerPl Elph-Mcnc: 0.9 g/dL (ref 0.7–1.3)
Gamma Glob SerPl Elph-Mcnc: 0.4 g/dL (ref 0.4–1.8)
Globulin, Total: 2.4 g/dL (ref 2.2–3.9)
IgA: 61 mg/dL (ref 61–437)
IgG (Immunoglobin G), Serum: 396 mg/dL — ABNORMAL LOW (ref 603–1613)
IgM (Immunoglobulin M), Srm: 25 mg/dL (ref 15–143)
Total Protein ELP: 6.5 g/dL (ref 6.0–8.5)

## 2021-07-17 NOTE — Telephone Encounter (Signed)
-----   Message from Ladell Pier, MD sent at 07/16/2021  5:51 PM EST ----- Please call patient, iron is at low end of normal range, add ferritin to next lab visit, follow-up as scheduled

## 2021-07-17 NOTE — Telephone Encounter (Signed)
V/M message left for Pt about lab results. Informed Pt to return if any questions or concerns.

## 2021-07-17 NOTE — Telephone Encounter (Signed)
Pt scheduled to see Dr. Lorenso Courier on 08/11/21 @ 930am.

## 2021-07-17 NOTE — Telephone Encounter (Signed)
Per Dr. Tarri Glenn, pt is needing scheduled with Dr. Lorenso Courier for altered bowel habits. LVM requesting returned call.

## 2021-07-21 ENCOUNTER — Other Ambulatory Visit: Payer: Self-pay

## 2021-07-21 ENCOUNTER — Ambulatory Visit: Payer: PPO | Attending: Nurse Practitioner

## 2021-07-21 DIAGNOSIS — M6281 Muscle weakness (generalized): Secondary | ICD-10-CM | POA: Diagnosis not present

## 2021-07-21 DIAGNOSIS — R2689 Other abnormalities of gait and mobility: Secondary | ICD-10-CM | POA: Diagnosis not present

## 2021-07-21 DIAGNOSIS — R41841 Cognitive communication deficit: Secondary | ICD-10-CM | POA: Insufficient documentation

## 2021-07-21 DIAGNOSIS — R4701 Aphasia: Secondary | ICD-10-CM | POA: Insufficient documentation

## 2021-07-21 DIAGNOSIS — R2681 Unsteadiness on feet: Secondary | ICD-10-CM | POA: Diagnosis not present

## 2021-07-21 NOTE — Patient Instructions (Signed)
VNeST technique:   1) Choose a common verb from the list provided.   2) Think of 3 people (or subjects) who could perform the action, or think of 3 objects the action could be done to. It might be easier to work on one complete set at a time, thinking of the object and person/subject together. The goal is to be as specific as possible with the nouns, so "farmer" would be a better subject than "man" for the verb drive. Write each subject and each object down on the paper in the appropriate column so you'll have 3 triads (subject-verb-object). It's okay to get personal. Family members, friends, and pets make great subjects for VNeST! Just try to vary the responses, so not all the nouns are personal or there is just one type of object. Similarly, try to use many different meanings of the verb if possible.   3) Read each triad of words aloud when you write them down on the lines below the chart. It's not important to conjugate the verb or add any articles to the nouns (farmer drive tractor), but it's okay if you do (the farmer drives the tractor).   4) Select one of the three triads to expand upon. Ask WHERE it happens, WHY it happens, and WHEN it happens. Try to get as specific as possible, and write the responses down on the last line following the triad you chose to expand. Then, read the expanded sentence with the three answers to the questions. Again, the grammar doesn't matter as much as the focus is on connecting the concepts.

## 2021-07-21 NOTE — Therapy (Signed)
[facility] [address], [place], [name], [address] Phone: [phone]   Fax:  [phone]  Speech Language Pathology Treatment/Discharge Summary  Patient Details  Name: Jonathan [name] MRN: [id number] Date of Birth: [birth year] Referring Provider (SLP): Lazaro Arms, NP   Encounter Date: 07/21/2021   End of Session - 07/21/21 1040     Visit Number 9    Number of Visits 9    Date for SLP Re-Evaluation 08/08/21    SLP Start Time 0935    SLP Stop Time  1017    SLP Time Calculation (min) 42 min    Activity Tolerance Patient tolerated treatment well             Past Medical History:  Diagnosis Date   Acute encephalopathy 05/08/2019   Allergic rhinitis, seasonal 12/04/2011   Arthritis of hand 11/08/2020   BPH (benign prostatic hyperplasia)    Bronchitis    Cataract    Chronic anticoagulation 12/21/2017   CHADS VASC=4, Eliquis stopped Sept 2021- recurrent falls with SDH   Chronic atrial fibrillation    On Eliquis   Chronic pain, legs and back 03/10/2017   Preferred pain management Leg and back pain   Compression fracture of thoracic vertebra 09/21/2017   COPD (chronic obstructive pulmonary disease)    2 liters O2 HS   Degeneration of thoracic intervertebral disc 09/21/2017   Degenerative joint disease of hand 07/14/2017   Dupuytren's disease of palm 07/12/2019   Essential hypertension 12/04/2011   Fatty tumor    waste and back   Fibromyalgia    Generalized anxiety disorder 06/12/2020   12/21 He is reporting severe insomnia and anxiety symptoms.  Options are limited.  We will try a low-dose lorazepam at night and during the day for anxiety and panic attacks.  He will stop lorazepam if problems.  He will stop drinking alcohol.  Discontinue BuSpar.  Reduce Cymbalta to 1 a day.   GERD (gastroesophageal reflux disease)    Glaucoma 03/08/2016   Gout 03/08/2016   Hereditary and idiopathic peripheral neuropathy 08/28/2015    History of COVID-19 05/08/2019   2021 Post-COVID sx's -" brain fog" Try Lion's mane supplement and B complex with niacin for neuropathy   Hyperlipidemia 03/10/2017   Hypothyroidism    Hypoxia    Insomnia    Leukocytosis 03/14/2020   Major depressive disorder 09/07/2016   Obstructive sleep apnea    Osteoarthritis    Osteoporosis    Recurrent falls 03/25/2020   PT. Treat neuropathy, insomnia.  Reduce Cymbalta to 1 a day.  Discontinue BuSpar.   Rotator cuff arthropathy of left shoulder 07/21/2019   SDH (subdural hematoma) 03/21/2020   Recurrent SDH secondary to falls at home- Sept 2019 and again 03/14/2020- Eliquis stopped.   Sebaceous cyst 06/27/2019   Spinal compression fracture seventh vertebre   Spondylolisthesis 09/07/2016   On fosamax - for about one year - Dr Alyson Ingles  10/14/17 dexa: normal dexa -- spine 2.1,   RFN -0.9,  LFN   -0.7   - no comparison on file, previous fracture      Thoracic back pain 08/10/2017   Thrombocytopenia 12/20/2020   TIA (transient ischemic attack) 08/28/2015   Type 2 diabetes mellitus 03/08/2016    Past Surgical History:  Procedure Laterality Date   APPENDECTOMY  age 24   CARPAL TUNNEL RELEASE Right    early 2000s   CATARACT EXTRACTION     bilateral   CHOLECYSTECTOMY  age 34  EYE SURGERY     FOOT ARTHRODESIS Right 02/02/2013   Procedure: RIGHT HALLUX METATARSAL PHALANGEAL JOINT ARTHRODESIS ;  Surgeon: Wylene Simmer, MD;  Location: [facility];  Service: Orthopedics;  Laterality: Right;   INGUINAL HERNIA REPAIR  age 89   rt side   NASAL CONCHA BULLOSA RESECTION  age 58   PROSTATE SURGERY     SHOULDER ARTHROSCOPY W/ ROTATOR CUFF REPAIR Right    early 2000s   tonsil     VASECTOMY  age 37    There were no vitals filed for this visit.  SPEECH THERAPY DISCHARGE SUMMARY  Visits from Start of Care: 9  Current functional level related to goals / functional outcomes: See goals below. Pt acknowledges ST  has been helpful for him.    Remaining deficits: Cognitive/communication deficits, aphasia.   Education / Equipment: Compensations for cognitive-linguistic deficits.   Patient agrees to discharge. Patient goals were partially met. Patient is being discharged due to being pleased with the current functional level., given compensations.      Subjective Assessment - 07/21/21 0939     Subjective "I just feel like I'm running 80 miles an hour."                   ADULT SLP TREATMENT - 07/21/21 1044       General Information   Behavior/Cognition Alert;Cooperative;Pleasant mood      Treatment Provided   Treatment provided Cognitive-Linquistic      Cognitive-Linquistic Treatment   Treatment focused on Cognition;Aphasia    Skilled Treatment SLP talked about being more organized at this time than prior to ST- he agrees he feels like he has a better handle on organizing things to do around the house but cont to lack motivation to complete them. SLP provided VNeST for pt today with an explanation/rationale, and demonstration, and encouraged him everyday to plan out when he could complete one of these. Pt stated  he would put his in his schedule. He told SLP that he thought ST was helpful for him.      Assessment / Recommendations / Plan   Plan --   d/c     Progression Toward Goals   Progression toward goals --   see goals             SLP Education - 07/21/21 1039     Education Details VNeST, SFA    Person(s) Educated Patient    Methods Explanation;Demonstration;Handout    Comprehension Verbalized understanding;Returned demonstration              SLP Short Term Goals - 06/12/21 1323       SLP SHORT TERM GOAL #1   Title pt will undergo cognitive linguistic testing and goals added PRN    Status Achieved    Target Date 05/23/21      SLP SHORT TERM GOAL #2   Title pt will report using 3 compensations for attention between 2 sessions    Baseline 06-03-21    Time 4    Period Weeks     Status Not Met   and ongoing   Target Date 06/13/21              SLP Long Term Goals - 07/21/21 1041       SLP LONG TERM GOAL #1   Title pt will demo independence with both semantic feature analysis (SFA) and verb network strengthening treatment (VNeST)    Period --   sessions  Status Partially Met   and ongoing   Target Date 07/21/21      SLP LONG TERM GOAL #2   Title pt will report that he feels more confident than prior to ST with his conversational ability    Time 1    Period --   sessions   Status Deferred      SLP LONG TERM GOAL #3   Title pt will report using 3 compensations for attention between 2 sessions    Baseline 07-15-21    Status Achieved    Target Date 08/08/21      SLP LONG TERM GOAL #4   Title pt will catch errors made on more functional detailed tasks in 3 sessions using compensations    Baseline 07-08-21 (calendar item), 07-15-21 (reminder), 07-21-21 (calendar)    Status Achieved    Target Date 08/08/21      SLP LONG TERM GOAL #5   Title pt will score better on cognitive QOL measure than on 05-19-21    Status Deferred   pt endorses a measure of depression - SLP thinks this may play a role in pt's reporting of his QOL             Plan - 07/21/21 1040     Clinical Impression Statement Pt complained of decr'd motivation. Today SLP assisted pt with learning about therapy techniques he can complete each day to foster stronger language connections. He will be discharged this session, and reported to SLP that he has found speech therapy helpful.    Treatment/Interventions SLP instruction and feedback;Patient/family education;Compensatory techniques;Language facilitation;Multimodal communcation approach    Potential to Achieve Goals Good    SLP Home Exercise Plan semantic feature analysis    Consulted and Agree with Plan of Care Patient             Patient will benefit from skilled therapeutic intervention in order to improve the following deficits  and impairments:   Cognitive communication deficit  Aphasia    Problem List Patient Active Problem List   Diagnosis Date Noted   Aortic atherosclerosis ([place]) 02/04/2021   Mild cognitive impairment 01/04/2021   Fatty liver 12/29/2020   Thrombocytopenia 12/20/2020   Urinary frequency 12/20/2020   Arthritis of hand 11/08/2020   Generalized anxiety disorder 06/12/2020   Recurrent falls 03/25/2020   History of SDH 03/21/2020   Rotator cuff arthropathy of left shoulder 07/21/2019   Dupuytren's disease of palm 07/12/2019   Sebaceous cyst 06/27/2019   History of COVID-19 05/08/2019   Obstructive sleep apnea    Asymmetrical left sensorineural hearing loss 07/12/2018   Chronic anticoagulation 12/21/2017   Degeneration of thoracic intervertebral disc 09/21/2017   Thoracic back pain 08/10/2017   Degenerative joint disease of hand 07/14/2017   Osteoarthritis    Fibromyalgia    Fatty tumor    BPH (benign prostatic hyperplasia)    Chronic atrial fibrillation    Insomnia 03/10/2017   Chronic pain, legs and back 03/10/2017   Hyperlipidemia 03/10/2017   Obesity (BMI 30.0-34.9) 12/08/2016   Depression 09/07/2016   Spondylolisthesis 09/07/2016   Hypothyroidism 03/08/2016   GERD (gastroesophageal reflux disease) 03/08/2016   Gout 03/08/2016   Glaucoma 03/08/2016   Type 2 diabetes mellitus 03/08/2016   Hereditary and idiopathic peripheral neuropathy 08/28/2015   History of TIA 08/28/2015   Essential hypertension 12/04/2011   Allergic rhinitis, seasonal 12/04/2011   COPD (chronic obstructive pulmonary disease)  07/18/2011    ,, CCC-SLP 07/21/2021, 10:50 AM  [facility] [address], [place], [name], [address] Phone: [phone]   Fax:  [phone]   Name: Jonathan [name] MRN: [id number] Date of Birth: 12/30/1932

## 2021-07-22 ENCOUNTER — Other Ambulatory Visit: Payer: Self-pay

## 2021-07-22 ENCOUNTER — Encounter: Payer: Self-pay | Admitting: Internal Medicine

## 2021-07-22 ENCOUNTER — Telehealth: Payer: Self-pay | Admitting: Physical Therapy

## 2021-07-22 ENCOUNTER — Ambulatory Visit: Payer: PPO | Admitting: Physical Therapy

## 2021-07-22 ENCOUNTER — Encounter: Payer: Self-pay | Admitting: Physical Therapy

## 2021-07-22 DIAGNOSIS — R2689 Other abnormalities of gait and mobility: Secondary | ICD-10-CM

## 2021-07-22 DIAGNOSIS — M6281 Muscle weakness (generalized): Secondary | ICD-10-CM

## 2021-07-22 DIAGNOSIS — R41841 Cognitive communication deficit: Secondary | ICD-10-CM | POA: Diagnosis not present

## 2021-07-22 DIAGNOSIS — R2681 Unsteadiness on feet: Secondary | ICD-10-CM

## 2021-07-22 NOTE — Telephone Encounter (Signed)
Appointment made

## 2021-07-22 NOTE — Therapy (Signed)
[facility] [address] [place], [name], [address] Phone: [phone]   Fax:  [phone]  Physical Therapy Treatment  Patient Details  Name: Jonathan [name] MRN: [id number] Date of Birth: 20-Dec-1932 Referring Provider (PT): Lazaro Arms, NP   Encounter Date: 07/22/2021   PT End of Session - 07/22/21 1249     Visit Number 14    Number of Visits 20    Date for PT Re-Evaluation 09/02/21    Authorization Type Medicare/BCBS    Progress Note Due on Visit 19   *Progress note completed on 9th visit   PT Start Time 1015    PT Stop Time 1054    PT Time Calculation (min) 39 min    Equipment Utilized During Treatment Gait belt    Activity Tolerance Patient tolerated treatment well    Behavior During Therapy WFL for tasks assessed/performed             Past Medical History:  Diagnosis Date   Acute encephalopathy 05/08/2019   Allergic rhinitis, seasonal 12/04/2011   Arthritis of hand 11/08/2020   BPH (benign prostatic hyperplasia)    Bronchitis    Cataract    Chronic anticoagulation 12/21/2017   CHADS VASC=4, Eliquis stopped Sept 2021- recurrent falls with SDH   Chronic atrial fibrillation    On Eliquis   Chronic pain, legs and back 03/10/2017   Preferred pain management Leg and back pain   Compression fracture of thoracic vertebra 09/21/2017   COPD (chronic obstructive pulmonary disease)    2 liters O2 HS   Degeneration of thoracic intervertebral disc 09/21/2017   Degenerative joint disease of hand 07/14/2017   Dupuytren's disease of palm 07/12/2019   Essential hypertension 12/04/2011   Fatty tumor    waste and back   Fibromyalgia    Generalized anxiety disorder 06/12/2020   12/21 He is reporting severe insomnia and anxiety symptoms.  Options are limited.  We will try a low-dose lorazepam at night and during the day for anxiety and panic attacks.  He will stop lorazepam if problems.  He will stop drinking alcohol.   Discontinue BuSpar.  Reduce Cymbalta to 1 a day.   GERD (gastroesophageal reflux disease)    Glaucoma 03/08/2016   Gout 03/08/2016   Hereditary and idiopathic peripheral neuropathy 08/28/2015   History of COVID-19 05/08/2019   2021 Post-COVID sx's -" brain fog" Try Lion's mane supplement and B complex with niacin for neuropathy   Hyperlipidemia 03/10/2017   Hypothyroidism    Hypoxia    Insomnia    Leukocytosis 03/14/2020   Major depressive disorder 09/07/2016   Obstructive sleep apnea    Osteoarthritis    Osteoporosis    Recurrent falls 03/25/2020   PT. Treat neuropathy, insomnia.  Reduce Cymbalta to 1 a day.  Discontinue BuSpar.   Rotator cuff arthropathy of left shoulder 07/21/2019   SDH (subdural hematoma) 03/21/2020   Recurrent SDH secondary to falls at home- Sept 2019 and again 03/14/2020- Eliquis stopped.   Sebaceous cyst 06/27/2019   Spinal compression fracture seventh vertebre   Spondylolisthesis 09/07/2016   On fosamax - for about one year - Dr Alyson Ingles  10/14/17 dexa: normal dexa -- spine 2.1,   RFN -0.9,  LFN   -0.7   - no comparison on file, previous fracture      Thoracic back pain 08/10/2017   Thrombocytopenia 12/20/2020   TIA (transient ischemic attack) 08/28/2015   Type 2 diabetes mellitus 03/08/2016  Past Surgical History:  Procedure Laterality Date   APPENDECTOMY  age 57   CARPAL TUNNEL RELEASE Right    early 2000s   CATARACT EXTRACTION     bilateral   CHOLECYSTECTOMY  age 73   EYE SURGERY     FOOT ARTHRODESIS Right 02/02/2013   Procedure: RIGHT HALLUX METATARSAL PHALANGEAL JOINT ARTHRODESIS ;  Surgeon: Wylene Simmer, MD;  Location: [facility];  Service: Orthopedics;  Laterality: Right;   INGUINAL HERNIA REPAIR  age 66   rt side   NASAL CONCHA BULLOSA RESECTION  age 88   PROSTATE SURGERY     SHOULDER ARTHROSCOPY W/ ROTATOR CUFF REPAIR Right    early 2000s   tonsil     VASECTOMY  age 59    There were no vitals filed for this visit.    Subjective Assessment - 07/22/21 1016     Subjective Fell flat on his face during a fall that occured yesterday while walking. Believes that he fainted briefly before his fall and tried to catch himself with his hands. Feels that he has been a little unsteady since. Reports that he was vomiting the day before. Denies LOC, increased confusion or memory loss, HAs, dizziness, new nausea/vomiting, vision changes, N/T, weakness.  Notes some increased blurred vision when reading. Having pain in the L side of the ribs and L hand.    Pertinent History PMH: pAF on Eliquis (to see cardiologist on 04/23/21), chronic pain on oxycodone and Ambien, previous SDH, COPD on home O2 noct, HTN, hypothyroidism, OSA on CPAP, and DM    Patient Stated Goals Pt's goal for therapy is to improve balance, strengthening, and walking.    Currently in Pain? Yes    Pain Score 3     Pain Location Rib cage    Pain Orientation Right    Pain Descriptors / Indicators Sharp;Dull    Pain Type Acute pain                [facility] PT Assessment - 07/22/21 0001       Assessment   Medical Diagnosis physical deconditioning, hx of Covid    Referring Provider (PT) Lazaro Arms, NP    Onset Date/Surgical Date 03/03/21      Standardized Balance Assessment   Standardized Balance Assessment Five Times Sit to Stand    Five times sit to stand comments  12.14 sec   without UEs     Functional Gait  Assessment   Gait assessed  Yes    Gait Level Surface Walks 20 ft in less than 7 sec but greater than 5.5 sec, uses assistive device, slower speed, mild gait deviations, or deviates 6-10 in outside of the 12 in walkway width.    Change in Gait Speed Able to change speed, demonstrates mild gait deviations, deviates 6-10 in outside of the 12 in walkway width, or no gait deviations, unable to achieve a major change in velocity, or uses a change in velocity, or uses an assistive device.    Gait with Horizontal Head Turns Performs head turns smoothly  with no change in gait. Deviates no more than 6 in outside 12 in walkway width    Gait with Vertical Head Turns Performs head turns with no change in gait. Deviates no more than 6 in outside 12 in walkway width.    Gait and Pivot Turn Pivot turns safely within 3 sec and stops quickly with no loss of balance.    Step Over Obstacle Is able  to step over 2 stacked shoe boxes taped together (9 in total height) without changing gait speed. No evidence of imbalance.    Gait with Narrow Base of Support Ambulates less than 4 steps heel to toe or cannot perform without assistance.    Gait with Eyes Closed Walks 20 ft, slow speed, abnormal gait pattern, evidence for imbalance, deviates 10-15 in outside 12 in walkway width. Requires more than 9 sec to ambulate 20 ft.    Ambulating Backwards Walks 20 ft, slow speed, abnormal gait pattern, evidence for imbalance, deviates 10-15 in outside 12 in walkway width.    Steps Alternating feet, no rail.    Total Score 21                                    PT Education - 07/22/21 1248     Education Details discussion on patient's recent fall and edu on importance of seeking immediate medical care s/p head trauma; advised patient to call PCP d/t his refusal to be seen at urgent care/ED today    Person(s) Educated Patient    Methods Explanation    Comprehension Verbalized understanding              PT Short Term Goals - 07/22/21 1045       PT SHORT TERM GOAL #1   Title Pt will be independent with progression of HEP for improved strength, balance, transfers, and gait.  TARGET 07/11/2021    Time 3    Period Weeks    Status Partially Met   met for current   Target Date 08/12/21      PT SHORT TERM GOAL #2   Title Pt will improve 5x sit<>stand to less than or equal to 11.5 sec to demonstrate improved functional strength and transfer efficiency.    Baseline 12.38 sec with decreased forward lean to initiate; 05/13/21:  15.47 sec, 2nd  trial 12.38 sec;    Time 3    Period Weeks    Status On-going   12.14 sec on 07/22/21   Target Date 08/12/21               PT Long Term Goals - 07/22/21 1047       PT LONG TERM GOAL #1   Title Pt will be independent with final HEP for improved strength, balance, transfers, and gait.  TARGET 07/25/2021    Time 6    Period Weeks    Status Partially Met   reports partial compliance; denies questions   Target Date 09/02/21      PT LONG TERM GOAL #2   Title Pt will improve FGA score to at least 24/30 to decrease fall risk.    Baseline 15/24; 21/24 06/10/2021    Time 6    Status On-going   21/30 on 02/07   Target Date 09/02/21      PT LONG TERM GOAL #3   Title Patient to complete 457mon 6 minute walk test with LRAD. (>1300 ft)    Baseline 1206 ft    Time 6    Period Weeks    Status Unable to assess   not assessed d/t new onset of symptoms today   Target Date 09/02/21      PT LONG TERM GOAL #4   Title Pt will verbalize plans for continued community fitness upon d/c from PT to maximize gains made in therapy.  Time 6    Period Weeks    Status Partially Met   currently walking 2-3x/week but has not started gym activities d/t low motivation   Target Date 09/02/21                   Plan - 07/22/21 1249     Clinical Impression Statement Patient arrived to session with report of experiencing a fall resulting in head trauma yesterday. Suspects that he fainted prior to the fall. Reports worsening imbalance, blurred vision, pain in the L hand and side of the ribs which are bruised upon inspection. Also reports nausea and vomiting the day prior. Denies LOC, increased confusion or memory loss, HAs, dizziness, new nausea/vomiting, N/T, weakness. Patient refused to be seen at urgent care/ED despite education on possible concussion. Advised him to seek immediate medical attention if symptoms worsen and to contact PCPs office for work up. Patient reported understanding. 6 minute  walk test not assessed today d/t safety today. Patient scored 21/30 on FGA, indicating an increased risk of falls. Patient with marked difficulty performing narrow BOS today despite overall score not changing from baseline. 5xSTS score grossly unchanged today and not quite yet meeting goal. Admits to inconsistent HEP compliance but reports that he walks for exercise 2-3x/week for about 1/3 mile. At this time patient still demonstrates imbalance that needs to be addressed. Spoke with him about increasing compliance with HEP and return to light gym routine AFTER he is medically addressed s/p fall. Patient would benefit from additional skilled PT services 1x/week for 6 weeks to address remaining goals.    Comorbidities PMH: pAF on Eliquis, chronic pain on oxycodone and Ambien, previous SDH, COPD on home O2 noct, HTN, hypothyroidism, OSA on CPAP, and DM, peripheral neuropathy    PT Frequency 1x / week    PT Duration 8 weeks    PT Treatment/Interventions ADLs/Self Care Home Management;Gait training;Functional mobility training;Therapeutic activities;Therapeutic exercise;Balance training;Neuromuscular re-education;Patient/family education    PT Next Visit Plan f/u s/p fall; continue turning activities, vestibular training for balance, lateral weigthshifting and hip strengthening, gait with head turns/nods and compliant surfaces.  Try resisted gait and side step activities for balance recovery    Consulted and Agree with Plan of Care Patient             Patient will benefit from skilled therapeutic intervention in order to improve the following deficits and impairments:  Abnormal gait, Difficulty walking, Decreased balance, Decreased mobility, Decreased strength  Visit Diagnosis: Unsteadiness on feet  Muscle weakness (generalized)  Other abnormalities of gait and mobility     Problem List Patient Active Problem List   Diagnosis Date Noted   Aortic atherosclerosis ([place]) 02/04/2021   Mild  cognitive impairment 01/04/2021   Fatty liver 12/29/2020   Thrombocytopenia 12/20/2020   Urinary frequency 12/20/2020   Arthritis of hand 11/08/2020   Generalized anxiety disorder 06/12/2020   Recurrent falls 03/25/2020   History of SDH 03/21/2020   Rotator cuff arthropathy of left shoulder 07/21/2019   Dupuytren's disease of palm 07/12/2019   Sebaceous cyst 06/27/2019   History of COVID-19 05/08/2019   Obstructive sleep apnea    Asymmetrical left sensorineural hearing loss 07/12/2018   Chronic anticoagulation 12/21/2017   Degeneration of thoracic intervertebral disc 09/21/2017   Thoracic back pain 08/10/2017   Degenerative joint disease of hand 07/14/2017   Osteoarthritis    Fibromyalgia    Fatty tumor    BPH (benign prostatic hyperplasia)    Chronic atrial fibrillation  Insomnia 03/10/2017   Chronic pain, legs and back 03/10/2017   Hyperlipidemia 03/10/2017   Obesity (BMI 30.0-34.9) 12/08/2016   Depression 09/07/2016   Spondylolisthesis 09/07/2016   Hypothyroidism 03/08/2016   GERD (gastroesophageal reflux disease) 03/08/2016   Gout 03/08/2016   Glaucoma 03/08/2016   Type 2 diabetes mellitus 03/08/2016   Hereditary and idiopathic peripheral neuropathy 08/28/2015   History of TIA 08/28/2015   Essential hypertension 12/04/2011   Allergic rhinitis, seasonal 12/04/2011   COPD (chronic obstructive pulmonary disease)  07/18/2011    Janene Harvey, PT, DPT 07/22/21 1:00 PM   [facility] [address], [place], [name], [address] Phone: [phone]   Fax:  [phone]  Name: Jonathan [name] MRN: [id number] Date of Birth: 09-14-1932

## 2021-07-22 NOTE — Telephone Encounter (Signed)
Hi Dr. Quay Burow,  I saw Jonathan [name] today in [place] for balance and weakness as usual. He reported experiencing a fall forward resulting in head trauma yesterday. Suspects that he fainted prior to the fall. Reports worsening imbalance, blurred vision, pain in the L hand and side of the ribs which are bruised. Also reports nausea and vomiting the day prior. Denies LOC, increased confusion or memory loss, HAs, dizziness, new nausea/vomiting, N/T, weakness.   The patient refused to be seen at urgent care/ED despite education on possible concussion. Advise him to seek immediate medical attention if symptoms worsen. I wanted to make you aware and wonder if he can be seen in your office for work up? Please advise.   Thanks!  Janene Harvey, PT, DPT

## 2021-07-23 ENCOUNTER — Telehealth: Payer: Self-pay

## 2021-07-23 ENCOUNTER — Ambulatory Visit: Payer: PPO | Admitting: Physician Assistant

## 2021-07-23 NOTE — Chronic Care Management (AMB) (Signed)
Chronic Care Management Pharmacy Assistant   Name: Pesach Frisch  MRN: [id number] DOB: 04-04-1933  Jonathan [name] is an 88 y.o. year old male who presents for his follow-up CCM visit with the clinical pharmacist.  Reason for Encounter: Disease State-General     Recent office visits:  07/09/21 Binnie Rail, MD-PCP (Essential Hypertension) Blood work ordered, No med changes  04/07/21 Binnie Rail, MD-PCP (Essential Hypertension) No orders or med changes  Recent consult visits:  07/16/21 Swinyer, Lanice Schwab, NP (Medication management) Labs ordered, med change: amiodarone 200 mg  1/31/23Sherrill, Izola Price, MD-Oncology (Thrombocytopenia) Orders:myeloma panel, no med changes  06/17/21 Darreld Mclean, PA-C-Cardiology (Afib) Labs ordered, no med changes 04/23/21 Lelon Perla, MD-Cardiology (Paroxysmal atrial fibrillation) No med changes   Hospital visits:  07/10/21 follow-up of atrial fibrillation discharged 07/10/21 from [facility] START Amiodarone 200 mg 2 times a day for 2 weeks then week 3 going forward take 200 mg daily Medications: Outpatient Encounter Medications as of 07/23/2021  Medication Sig   acetaminophen (TYLENOL) 650 MG CR tablet Take 1,300 mg by mouth in the morning and at bedtime.   acyclovir ointment (ZOVIRAX) 5 % Apply 1 application topically as needed.   allopurinol (ZYLOPRIM) 300 MG tablet Take 1 tablet (300 mg total) by mouth every evening. For gout   amiodarone (PACERONE) 200 MG tablet Take 1 tablet (200 mg total) by mouth daily. Take 1 tablet (200 mg) by mouth 1 time daily   atorvastatin (LIPITOR) 20 MG tablet Take 1 tablet (20 mg total) by mouth daily. For cholesterol   Calcium Citrate-Vitamin D (CALCIUM + D PO) Take 1 tablet by mouth daily.   carboxymethylcellulose (REFRESH PLUS) 0.5 % SOLN Place 1 drop into both eyes 3 (three) times daily as needed (dry eyes).   clobetasol (TEMOVATE) 0.05 % external solution Apply 1 application topically 2 (two)  times daily as needed (rash).   Continuous Blood Gluc Receiver (FREESTYLE LIBRE 14 DAY READER) DEVI UAD to check sugars.  E11.65   Continuous Blood Gluc Sensor (FREESTYLE LIBRE 14 DAY SENSOR) MISC UAD to check sugars, E11.65   dapagliflozin propanediol (FARXIGA) 10 MG TABS tablet Take 1 tablet (10 mg total) by mouth daily before breakfast. For diabetes   diltiazem (CARDIZEM CD) 300 MG 24 hr capsule TAKE ONE CAPSULE BY MOUTH DAILY   diltiazem (CARDIZEM) 30 MG tablet Take 1 tablet (30 mg total) by mouth 2 (two) times daily as needed (sustained elevated heart rates >100 bpm for > 20 minutes.).   ELIQUIS 5 MG TABS tablet TAKE ONE TABLET BY MOUTH TWICE A DAY   Emollient (RA RENEWAL DARK SPOT CORRECTOR EX) Apply 1 application topically daily as needed (dark spots).   FLUoxetine (PROZAC) 40 MG capsule Take 1 capsule (40 mg total) by mouth daily. For depression   Fluticasone Propionate (ALLERGY RELIEF NA) Place 2 sprays into the nose daily as needed (allergies).   glucose blood test strip Use to check blood sugar daily. E11.9   hydrochlorothiazide (HYDRODIURIL) 12.5 MG tablet Take 1 tablet (12.5 mg total) by mouth daily.   Lancets MISC Use to check blood sugars daily. E11.9   latanoprost (XALATAN) 0.005 % ophthalmic solution Place 1 drop into both eyes at bedtime.   levothyroxine (SYNTHROID) 112 MCG tablet TAKE ONE TABLET BY MOUTH DAILY BEFORE BREAKFAST   Melatonin 5 MG TABS Take 5 mg by mouth at bedtime.   mometasone (NASONEX) 50 MCG/ACT nasal spray Place 2 sprays into the nose as  needed (allergies).   Multiple Vitamin (MULTIVITAMIN) tablet Take 1 tablet by mouth daily.   nitroGLYCERIN (NITROSTAT) 0.4 MG SL tablet Place 1 tablet (0.4 mg total) under the tongue every 5 (five) minutes as needed for chest pain.   omeprazole (PRILOSEC) 20 MG capsule TAKE ONE CAPSULE BY MOUTH DAILY   oxyCODONE-acetaminophen (PERCOCET) 10-325 MG tablet Take 1 tablet by mouth every 8 (eight) hours as needed for pain.    pregabalin (LYRICA) 75 MG capsule Take 1 capsule (75 mg total) by mouth 2 (two) times daily.   Probiotic Product (PROBIOTIC PO) Take 1 capsule by mouth daily.   RESTASIS 0.05 % ophthalmic emulsion Place 1 drop into both eyes 2 (two) times daily.    Semaglutide (RYBELSUS) 7 MG TABS Take 7 mg by mouth daily. Via [employer] pt assistance   sildenafil (REVATIO) 20 MG tablet Take 40-100 mg by mouth daily as needed.   silodosin (RAPAFLO) 8 MG CAPS capsule Take 8 mg by mouth daily.   Vibegron (GEMTESA) 75 MG TABS Take 75 mg by mouth daily.   vitamin C (ASCORBIC ACID) 500 MG tablet Take 500 mg by mouth daily.   VITAMIN D, CHOLECALCIFEROL, PO Take 1 capsule by mouth daily.   No facility-administered encounter medications on file as of 07/23/2021.   Have you had any problems recently with your health?Patient states that last Monday he was walking and pass out but does not why. Think that he may have broken a few ribs because he is so sore and tender in the chest area. He states that it was very quick when he passed out not a full minute. He states that his blood pressure has been high the last few day around 160/78. This morning when he checked blood pressure it was 130/60.Patient states that he has an appt to see Dr. Quay Burow 07/25/21.  Have you had any problems with your pharmacy?Patient states that he does not have any problems with getting medications from the pharmacy but that he is trying to get patient assistance for Charlsie Merles because it is every expensive  What issues or side effects are you having with your medications?Patient states that he is not having any side effects from medications  What would you like me to pass along to [facility] for them to help you with? Patient states that besides a little bruising he is ok  What can we do to take care of you better? Patient states that he does not need anything at this time  Care Gaps: Colonoscopy-NA Diabetic Foot  Exam-NA Ophthalmology-02/01/19 Dexa Scan - 10/14/17 Annual Well Visit -  Micro albumin-07/09/21 Hemoglobin A1c- 07/09/21  Star Rating Drugs: Atorvastatin 20 mg-last fill 07/05/21 90 ds  Ethelene Hal Clinical Pharmacist Assistant [phone]

## 2021-07-24 NOTE — Progress Notes (Signed)
Subjective:    Patient ID: Jonathan [name], male    DOB: 1933/01/30, 88 y.o.   MRN: [id number]  This visit occurred during the SARS-CoV-2 public health emergency.  Safety protocols were in place, including screening questions prior to the visit, additional usage of staff PPE, and extensive cleaning of exam room while observing appropriate contact time as indicated for disinfecting solutions.    HPI The patient is here for an acute visit for passing / fainting and falling during vigorous walking on 07/21/21.   He was walking outside.  He felt a little more robust than usual.  He was walking a little faster, but not speed walking.  As he was coming up the hill to finish and felt like he was leaning more forward than he should be and was concerned about falling.  He knew he had to straighten up a little bit and he did that, but then he took a couple of steps and ended up falling.  He knew he was falling.   He thnks he passed out while he was falling and came to before hitting the ground.  He hit his hands and minimally his head - there was no scrap or bruise on his head.  He has bruises on his chest and his chest wall is tender.  He laid there for a couple of minutes.  He had a slight unsteadiness when he got up and some unsteadiness since.  His balance has not been good for a while.  Last night he was bouncing around the kitchen while preparing dinner. His sugar last night at that time was 91.  Concentration seems to be a little worse.  He feels like his memory is getting worse-which is not new since the fall, just in general.   He has been more unsteady for a while.    Bp at home - 130/72,  154/72,  167/79, 166/84, 148/71   Medications and allergies reviewed with patient and updated if appropriate.  Patient Active Problem List   Diagnosis Date Noted   Aortic atherosclerosis ([place]) 02/04/2021   Mild cognitive impairment 01/04/2021   Fatty liver 12/29/2020   Thrombocytopenia 12/20/2020    Urinary frequency 12/20/2020   Arthritis of hand 11/08/2020   Generalized anxiety disorder 06/12/2020   Recurrent falls 03/25/2020   History of SDH 03/21/2020   Rotator cuff arthropathy of left shoulder 07/21/2019   Dupuytren's disease of palm 07/12/2019   Sebaceous cyst 06/27/2019   History of COVID-19 05/08/2019   Obstructive sleep apnea    Asymmetrical left sensorineural hearing loss 07/12/2018   Chronic anticoagulation 12/21/2017   Degeneration of thoracic intervertebral disc 09/21/2017   Thoracic back pain 08/10/2017   Degenerative joint disease of hand 07/14/2017   Osteoarthritis    Fibromyalgia    Fatty tumor    BPH (benign prostatic hyperplasia)    Chronic atrial fibrillation    Insomnia 03/10/2017   Chronic pain, legs and back 03/10/2017   Hyperlipidemia 03/10/2017   Obesity (BMI 30.0-34.9) 12/08/2016   Depression 09/07/2016   Spondylolisthesis 09/07/2016   Hypothyroidism 03/08/2016   GERD (gastroesophageal reflux disease) 03/08/2016   Gout 03/08/2016   Glaucoma 03/08/2016   Type 2 diabetes mellitus 03/08/2016   Hereditary and idiopathic peripheral neuropathy 08/28/2015   History of TIA 08/28/2015   Essential hypertension 12/04/2011   Allergic rhinitis, seasonal 12/04/2011   COPD (chronic obstructive pulmonary disease)  07/18/2011    Current Outpatient Medications on File Prior to Visit  Medication Sig Dispense  Refill   acetaminophen (TYLENOL) 650 MG CR tablet Take 1,300 mg by mouth in the morning and at bedtime.     acyclovir ointment (ZOVIRAX) 5 % Apply 1 application topically as needed.     allopurinol (ZYLOPRIM) 300 MG tablet Take 1 tablet (300 mg total) by mouth every evening. For gout 90 tablet 3   amiodarone (PACERONE) 200 MG tablet Take 1 tablet (200 mg total) by mouth daily. Take 1 tablet (200 mg) by mouth 1 time daily 90 tablet 3   atorvastatin (LIPITOR) 20 MG tablet Take 1 tablet (20 mg total) by mouth daily. For cholesterol 90 tablet 1   Calcium  Citrate-Vitamin D (CALCIUM + D PO) Take 1 tablet by mouth daily.     carboxymethylcellulose (REFRESH PLUS) 0.5 % SOLN Place 1 drop into both eyes 3 (three) times daily as needed (dry eyes).     clobetasol (TEMOVATE) 0.05 % external solution Apply 1 application topically 2 (two) times daily as needed (rash).     Continuous Blood Gluc Receiver (FREESTYLE LIBRE 14 DAY READER) DEVI UAD to check sugars.  E11.65 1 each 0   Continuous Blood Gluc Sensor (FREESTYLE LIBRE 14 DAY SENSOR) MISC UAD to check sugars, E11.65 2 each 5   dapagliflozin propanediol (FARXIGA) 10 MG TABS tablet Take 1 tablet (10 mg total) by mouth daily before breakfast. For diabetes 30 tablet 5   diltiazem (CARDIZEM CD) 300 MG 24 hr capsule TAKE ONE CAPSULE BY MOUTH DAILY 90 capsule 3   diltiazem (CARDIZEM) 30 MG tablet Take 1 tablet (30 mg total) by mouth 2 (two) times daily as needed (sustained elevated heart rates >100 bpm for > 20 minutes.). 30 tablet 2   ELIQUIS 5 MG TABS tablet TAKE ONE TABLET BY MOUTH TWICE A DAY 180 tablet 1   Emollient (RA RENEWAL DARK SPOT CORRECTOR EX) Apply 1 application topically daily as needed (dark spots).     FLUoxetine (PROZAC) 40 MG capsule Take 1 capsule (40 mg total) by mouth daily. For depression 90 capsule 3   Fluticasone Propionate (ALLERGY RELIEF NA) Place 2 sprays into the nose daily as needed (allergies).     glucose blood test strip Use to check blood sugar daily. E11.9 100 each 3   hydrochlorothiazide (HYDRODIURIL) 12.5 MG tablet Take 1 tablet (12.5 mg total) by mouth daily. 90 tablet 3   Lancets MISC Use to check blood sugars daily. E11.9 100 each 3   latanoprost (XALATAN) 0.005 % ophthalmic solution Place 1 drop into both eyes at bedtime.     levothyroxine (SYNTHROID) 112 MCG tablet TAKE ONE TABLET BY MOUTH DAILY BEFORE BREAKFAST 90 tablet 1   Melatonin 5 MG TABS Take 5 mg by mouth at bedtime.     mometasone (NASONEX) 50 MCG/ACT nasal spray Place 2 sprays into the nose as needed  (allergies). 17 g 5   Multiple Vitamin (MULTIVITAMIN) tablet Take 1 tablet by mouth daily.     nitroGLYCERIN (NITROSTAT) 0.4 MG SL tablet Place 1 tablet (0.4 mg total) under the tongue every 5 (five) minutes as needed for chest pain. 90 tablet 3   omeprazole (PRILOSEC) 20 MG capsule TAKE ONE CAPSULE BY MOUTH DAILY 90 capsule 1   oxyCODONE-acetaminophen (PERCOCET) 10-325 MG tablet Take 1 tablet by mouth every 8 (eight) hours as needed for pain.     pregabalin (LYRICA) 75 MG capsule Take 1 capsule (75 mg total) by mouth 2 (two) times daily. 60 capsule 0   Probiotic Product (PROBIOTIC PO) Take  1 capsule by mouth daily.     RESTASIS 0.05 % ophthalmic emulsion Place 1 drop into both eyes 2 (two) times daily.      Semaglutide (RYBELSUS) 7 MG TABS Take 7 mg by mouth daily. Via [employer] pt assistance 30 tablet 3   sildenafil (REVATIO) 20 MG tablet Take 40-100 mg by mouth daily as needed.     silodosin (RAPAFLO) 8 MG CAPS capsule Take 8 mg by mouth daily.     Vibegron (GEMTESA) 75 MG TABS Take 75 mg by mouth daily.     vitamin C (ASCORBIC ACID) 500 MG tablet Take 500 mg by mouth daily.     VITAMIN D, CHOLECALCIFEROL, PO Take 1 capsule by mouth daily.     No current facility-administered medications on file prior to visit.    Past Medical History:  Diagnosis Date   Acute encephalopathy 05/08/2019   Allergic rhinitis, seasonal 12/04/2011   Arthritis of hand 11/08/2020   BPH (benign prostatic hyperplasia)    Bronchitis    Cataract    Chronic anticoagulation 12/21/2017   CHADS VASC=4, Eliquis stopped Sept 2021- recurrent falls with SDH   Chronic atrial fibrillation    On Eliquis   Chronic pain, legs and back 03/10/2017   Preferred pain management Leg and back pain   Compression fracture of thoracic vertebra 09/21/2017   COPD (chronic obstructive pulmonary disease)    2 liters O2 HS   Degeneration of thoracic intervertebral disc 09/21/2017   Degenerative joint disease of hand 07/14/2017    Dupuytren's disease of palm 07/12/2019   Essential hypertension 12/04/2011   Fatty tumor    waste and back   Fibromyalgia    Generalized anxiety disorder 06/12/2020   12/21 He is reporting severe insomnia and anxiety symptoms.  Options are limited.  We will try a low-dose lorazepam at night and during the day for anxiety and panic attacks.  He will stop lorazepam if problems.  He will stop drinking alcohol.  Discontinue BuSpar.  Reduce Cymbalta to 1 a day.   GERD (gastroesophageal reflux disease)    Glaucoma 03/08/2016   Gout 03/08/2016   Hereditary and idiopathic peripheral neuropathy 08/28/2015   History of COVID-19 05/08/2019   2021 Post-COVID sx's -" brain fog" Try [place]'s mane supplement and B complex with niacin for neuropathy   Hyperlipidemia 03/10/2017   Hypothyroidism    Hypoxia    Insomnia    Leukocytosis 03/14/2020   Major depressive disorder 09/07/2016   Obstructive sleep apnea    Osteoarthritis    Osteoporosis    Recurrent falls 03/25/2020   PT. Treat neuropathy, insomnia.  Reduce Cymbalta to 1 a day.  Discontinue BuSpar.   Rotator cuff arthropathy of left shoulder 07/21/2019   SDH (subdural hematoma) 03/21/2020   Recurrent SDH secondary to falls at home- Sept 2019 and again 03/14/2020- Eliquis stopped.   Sebaceous cyst 06/27/2019   Spinal compression fracture seventh vertebre   Spondylolisthesis 09/07/2016   On fosamax - for about one year - Dr Alyson Ingles  10/14/17 dexa: normal dexa -- spine 2.1,   RFN -0.9,  LFN   -0.7   - no comparison on file, previous fracture      Thoracic back pain 08/10/2017   Thrombocytopenia 12/20/2020   TIA (transient ischemic attack) 08/28/2015   Type 2 diabetes mellitus 03/08/2016    Past Surgical History:  Procedure Laterality Date   APPENDECTOMY  age 55   CARPAL TUNNEL RELEASE Right    early 2000s  CATARACT EXTRACTION     bilateral   CHOLECYSTECTOMY  age 89   EYE SURGERY     FOOT ARTHRODESIS Right 02/02/2013   Procedure: RIGHT  HALLUX METATARSAL PHALANGEAL JOINT ARTHRODESIS ;  Surgeon: Wylene Simmer, MD;  Location: [facility];  Service: Orthopedics;  Laterality: Right;   INGUINAL HERNIA REPAIR  age 45   rt side   NASAL CONCHA BULLOSA RESECTION  age 71   PROSTATE SURGERY     SHOULDER ARTHROSCOPY W/ ROTATOR CUFF REPAIR Right    early 2000s   tonsil     VASECTOMY  age 88    Social History   Socioeconomic History   Marital status: Married    Spouse name: Not on file   Number of children: 2   Years of education: 18   Highest education level: Master's degree (e.g., MA, MS, MEng, MEd, MSW, MBA)  Occupational History   Occupation: RETIRED    Employer: RETIRED  Tobacco Use   Smoking status: Former    Packs/day: 2.00    Years: 10.00    Pack years: 20.00    Types: Cigarettes    Quit date: 07/18/1975    Years since quitting: 46.0   Smokeless tobacco: Never  Vaping Use   Vaping Use: Never used  Substance and Sexual Activity   Alcohol use: Yes    Comment: 1-2 drinks/day   Drug use: No   Sexual activity: Not Currently  Other Topics Concern   Not on file  Social History Narrative   Not on file   Social Determinants of Health   Financial Resource Strain: Not on file  Food Insecurity: Not on file  Transportation Needs: Not on file  Physical Activity: Not on file  Stress: Not on file  Social Connections: Not on file    Family History  Problem Relation Age of Onset   Coronary artery disease Brother    Heart disease Father    Lung cancer Father    Kidney cancer Father    Prostate cancer Father    Arthritis Mother    Lung cancer Mother    Dementia Mother        Unspecified type, not Alzheimer's disease    Review of Systems  Constitutional:  Negative for fever.  Respiratory:  Negative for shortness of breath.   Cardiovascular:  Negative for chest pain and palpitations.  Neurological:  Positive for headaches (had one headache since the fall that lasted 2 minutes). Negative for  dizziness and light-headedness.      Objective:   Vitals:   07/25/21 0951  BP: (!) 160/62  Pulse: (!) 57  Temp: 97.9 F (36.6 C)  SpO2: 94%   BP Readings from Last 3 Encounters:  07/25/21 (!) 160/62  07/16/21 (!) 152/64  07/15/21 139/63   Wt Readings from Last 3 Encounters:  07/25/21 176 lb 12.8 oz (80.2 kg)  07/16/21 177 lb (80.3 kg)  07/15/21 176 lb 3.2 oz (79.9 kg)   Body mass index is 28.54 kg/m.   Physical Exam    Constitutional: Appears well-developed and well-nourished. No distress.  Head: Normocephalic and atraumatic.  Neck: Neck supple. No tracheal deviation present. No thyromegaly present.  No cervical lymphadenopathy Cardiovascular: Normal rate, regular rhythm and normal heart sounds.  No murmur heard. No carotid bruit .  No edema Pulmonary/Chest: Effort normal and breath sounds normal. No respiratory distress. No has no wheezes. No rales.  Skin: Skin is warm and dry. Not diaphoretic.  Psychiatric: Normal mood  and affect. Behavior is normal.       Assessment & Plan:    See Problem List for Assessment and Plan of chronic medical problems.

## 2021-07-25 ENCOUNTER — Ambulatory Visit (INDEPENDENT_AMBULATORY_CARE_PROVIDER_SITE_OTHER): Payer: PPO | Admitting: Internal Medicine

## 2021-07-25 ENCOUNTER — Other Ambulatory Visit: Payer: Self-pay

## 2021-07-25 ENCOUNTER — Encounter: Payer: Self-pay | Admitting: Internal Medicine

## 2021-07-25 ENCOUNTER — Encounter (HOSPITAL_BASED_OUTPATIENT_CLINIC_OR_DEPARTMENT_OTHER): Payer: Self-pay

## 2021-07-25 ENCOUNTER — Ambulatory Visit (INDEPENDENT_AMBULATORY_CARE_PROVIDER_SITE_OTHER)
Admission: RE | Admit: 2021-07-25 | Discharge: 2021-07-25 | Disposition: A | Discharge status: Home / Self Care | Payer: PPO | Source: Ambulatory Visit | Attending: Internal Medicine | Admitting: Internal Medicine

## 2021-07-25 ENCOUNTER — Ambulatory Visit (INDEPENDENT_AMBULATORY_CARE_PROVIDER_SITE_OTHER): Payer: PPO

## 2021-07-25 VITALS — BP 160/62 | HR 57 | Temp 97.9°F | Ht 66.0 in | Wt 176.8 lb

## 2021-07-25 DIAGNOSIS — R410 Disorientation, unspecified: Secondary | ICD-10-CM

## 2021-07-25 DIAGNOSIS — R2681 Unsteadiness on feet: Secondary | ICD-10-CM

## 2021-07-25 DIAGNOSIS — I10 Essential (primary) hypertension: Secondary | ICD-10-CM | POA: Diagnosis not present

## 2021-07-25 DIAGNOSIS — W19XXXA Unspecified fall, initial encounter: Secondary | ICD-10-CM | POA: Insufficient documentation

## 2021-07-25 DIAGNOSIS — S0990XA Unspecified injury of head, initial encounter: Secondary | ICD-10-CM | POA: Diagnosis not present

## 2021-07-25 DIAGNOSIS — R0781 Pleurodynia: Secondary | ICD-10-CM

## 2021-07-25 DIAGNOSIS — R079 Chest pain, unspecified: Secondary | ICD-10-CM | POA: Diagnosis not present

## 2021-07-25 HISTORY — DX: Pleurodynia: R07.81

## 2021-07-25 MED ORDER — TELMISARTAN 20 MG PO TABS: 20.0000 mg | ORAL_TABLET | Freq: Every day | ORAL | 5 refills | Status: DC

## 2021-07-25 NOTE — Assessment & Plan Note (Signed)
Chronic Not ideally controlled Continue diltiazem 300 mg daily, hydrochlorothiazide 12.5 mg daily We will add telmisartan 20 mg daily Continue to monitor blood pressure at home He will update me with his blood pressure readings

## 2021-07-25 NOTE — Assessment & Plan Note (Signed)
Chronic He states unsteadiness for a while and has had some recurrent falls possibly multifactorial Will refer to neurology for further evaluation of unsteadiness, recurrent falls and confusion

## 2021-07-25 NOTE — Assessment & Plan Note (Signed)
Acute He states some increased confusion, which may be some change in memory related to his mild cognitive impairment He does have history of a brain bleed after falling and given his recent fall although the head trauma was mild I will go ahead and get a CT scan of his head, which ended up being negative To see neurology for further evaluation

## 2021-07-25 NOTE — Assessment & Plan Note (Signed)
Acute Related to fall 4 days ago He does have some tenderness and bruising on the chest wall We will get a chest x-ray/rib x-ray to rule out rib fracture although that seems unlikely

## 2021-07-25 NOTE — Assessment & Plan Note (Signed)
Acute He fell 4 days ago while walking The fall sounds somewhat mechanical/balance related-he was walking up an incline and was leaning to forward and tried to adjust his posture and after doing so ended up falling He thinks he may have passed out in the midst of falling, but remembers hitting the ground-?  Truly passed out or not Did advise that he see if he can see his cardiologist sooner and would also recommend seeing a neurologist for the fall, unsteadiness

## 2021-07-25 NOTE — Patient Instructions (Signed)
°  Xray of your ribs downstairs.     Medications changes include :  telmisartan 20 mg daily for BP    Your prescription(s) have been submitted to your pharmacy. Please take as directed and contact our office if you believe you are having problem(s) with the medication(s).   A referral was ordered for neurology.      Someone from their office will call you to schedule an appointment.     A CT of the head was ordered.

## 2021-07-27 ENCOUNTER — Encounter: Payer: Self-pay | Admitting: Internal Medicine

## 2021-07-28 ENCOUNTER — Encounter: Payer: Self-pay | Admitting: Physical Therapy

## 2021-07-28 ENCOUNTER — Other Ambulatory Visit: Payer: Self-pay

## 2021-07-28 ENCOUNTER — Ambulatory Visit: Payer: PPO | Admitting: Physical Therapy

## 2021-07-28 DIAGNOSIS — R2681 Unsteadiness on feet: Secondary | ICD-10-CM

## 2021-07-28 DIAGNOSIS — R2689 Other abnormalities of gait and mobility: Secondary | ICD-10-CM

## 2021-07-28 DIAGNOSIS — R41841 Cognitive communication deficit: Secondary | ICD-10-CM | POA: Diagnosis not present

## 2021-07-28 DIAGNOSIS — M6281 Muscle weakness (generalized): Secondary | ICD-10-CM

## 2021-07-28 NOTE — Therapy (Signed)
[facility] [address] [place], [name], [address] Phone: [phone]   Fax:  [phone]  Physical Therapy Treatment  Patient Details  Name: Jonathan [name] MRN: [id number] Date of Birth: 11/16/1932 Referring Provider (PT): Lazaro Arms, NP   Encounter Date: 07/28/2021   PT End of Session - 07/28/21 1707     Visit Number 15    Number of Visits 20    Date for PT Re-Evaluation 09/02/21    Authorization Type Medicare/BCBS    Progress Note Due on Visit 19   *Progress note completed on 9th visit   PT Start Time 1532    PT Stop Time 1614    PT Time Calculation (min) 42 min    Equipment Utilized During Treatment Gait belt    Activity Tolerance Patient tolerated treatment well    Behavior During Therapy WFL for tasks assessed/performed             Past Medical History:  Diagnosis Date   Acute encephalopathy 05/08/2019   Allergic rhinitis, seasonal 12/04/2011   Arthritis of hand 11/08/2020   BPH (benign prostatic hyperplasia)    Bronchitis    Cataract    Chronic anticoagulation 12/21/2017   CHADS VASC=4, Eliquis stopped Sept 2021- recurrent falls with SDH   Chronic atrial fibrillation    On Eliquis   Chronic pain, legs and back 03/10/2017   Preferred pain management Leg and back pain   Compression fracture of thoracic vertebra 09/21/2017   COPD (chronic obstructive pulmonary disease)    2 liters O2 HS   Degeneration of thoracic intervertebral disc 09/21/2017   Degenerative joint disease of hand 07/14/2017   Dupuytren's disease of palm 07/12/2019   Essential hypertension 12/04/2011   Fatty tumor    waste and back   Fibromyalgia    Generalized anxiety disorder 06/12/2020   12/21 He is reporting severe insomnia and anxiety symptoms.  Options are limited.  We will try a low-dose lorazepam at night and during the day for anxiety and panic attacks.  He will stop lorazepam if problems.  He will stop drinking alcohol.   Discontinue BuSpar.  Reduce Cymbalta to 1 a day.   GERD (gastroesophageal reflux disease)    Glaucoma 03/08/2016   Gout 03/08/2016   Hereditary and idiopathic peripheral neuropathy 08/28/2015   History of COVID-19 05/08/2019   2021 Post-COVID sx's -" brain fog" Try Lion's mane supplement and B complex with niacin for neuropathy   Hyperlipidemia 03/10/2017   Hypothyroidism    Hypoxia    Insomnia    Leukocytosis 03/14/2020   Major depressive disorder 09/07/2016   Obstructive sleep apnea    Osteoarthritis    Osteoporosis    Recurrent falls 03/25/2020   PT. Treat neuropathy, insomnia.  Reduce Cymbalta to 1 a day.  Discontinue BuSpar.   Rotator cuff arthropathy of left shoulder 07/21/2019   SDH (subdural hematoma) 03/21/2020   Recurrent SDH secondary to falls at home- Sept 2019 and again 03/14/2020- Eliquis stopped.   Sebaceous cyst 06/27/2019   Spinal compression fracture seventh vertebre   Spondylolisthesis 09/07/2016   On fosamax - for about one year - Dr Alyson Ingles  10/14/17 dexa: normal dexa -- spine 2.1,   RFN -0.9,  LFN   -0.7   - no comparison on file, previous fracture      Thoracic back pain 08/10/2017   Thrombocytopenia 12/20/2020   TIA (transient ischemic attack) 08/28/2015   Type 2 diabetes mellitus 03/08/2016  Past Surgical History:  Procedure Laterality Date   APPENDECTOMY  age 34   CARPAL TUNNEL RELEASE Right    early 2000s   CATARACT EXTRACTION     bilateral   CHOLECYSTECTOMY  age 54   EYE SURGERY     FOOT ARTHRODESIS Right 02/02/2013   Procedure: RIGHT HALLUX METATARSAL PHALANGEAL JOINT ARTHRODESIS ;  Surgeon: Wylene Simmer, MD;  Location: [facility];  Service: Orthopedics;  Laterality: Right;   INGUINAL HERNIA REPAIR  age 42   rt side   NASAL CONCHA BULLOSA RESECTION  age 42   PROSTATE SURGERY     SHOULDER ARTHROSCOPY W/ ROTATOR CUFF REPAIR Right    early 2000s   tonsil     VASECTOMY  age 28    There were no vitals filed for this visit.    Subjective Assessment - 07/28/21 1533     Subjective Saw his PCP who did some imaging which was all clear. Balance is still slightly off but is almost back to baseline except for some rib pain. Was prescribed some new BP medication but has not picked it up yet.    Pertinent History PMH: pAF on Eliquis (to see cardiologist on 04/23/21), chronic pain on oxycodone and Ambien, previous SDH, COPD on home O2 noct, HTN, hypothyroidism, OSA on CPAP, and DM    Diagnostic tests 07/25/21 chest xray negative, head CT showed no acute intracranial abnormality    Patient Stated Goals Pt's goal for therapy is to improve balance, strengthening, and walking.    Currently in Pain? Yes    Pain Score 3     Pain Location Back    Pain Orientation Lower    Pain Descriptors / Indicators Aching    Pain Type Chronic pain                [facility] PT Assessment - 07/28/21 0001       6 Minute Walk- Baseline   BP (mmHg) 155/70    HR (bpm) 75    02 Sat (%RA) 97 %    Modified Borg Scale for Dyspnea 0- Nothing at all      6 Minute walk- Post Test   BP (mmHg) 155/68    HR (bpm) 64    02 Sat (%RA) 96 %    Modified Borg Scale for Dyspnea 2- Mild shortness of breath      6 minute walk test results    Endurance additional comments 1209 feet                           [facility] Adult PT Treatment/Exercise - 07/28/21 0001       Neuro Re-ed    Neuro Re-ed Details  tandem walk in II bars with minor UE assist, static and dynnamic balance on rockerboard ant/pos and R/L with light UE support; R/L sidestepping and backwards stepping over hurdle 10x with CGA                       PT Short Term Goals - 07/22/21 1045       PT SHORT TERM GOAL #1   Title Pt will be independent with progression of HEP for improved strength, balance, transfers, and gait.  TARGET 07/11/2021    Time 3    Period Weeks    Status Partially Met   met for current   Target Date 08/12/21      PT SHORT TERM GOAL #  2    Title Pt will improve 5x sit<>stand to less than or equal to 11.5 sec to demonstrate improved functional strength and transfer efficiency.    Baseline 12.38 sec with decreased forward lean to initiate; 05/13/21:  15.47 sec, 2nd trial 12.38 sec;    Time 3    Period Weeks    Status On-going   12.14 sec on 07/22/21   Target Date 08/12/21               PT Long Term Goals - 07/28/21 1555       PT LONG TERM GOAL #1   Title Pt will be independent with final HEP for improved strength, balance, transfers, and gait.  TARGET 07/25/2021    Time 6    Period Weeks    Status Partially Met   reports partial compliance; denies questions   Target Date 09/02/21      PT LONG TERM GOAL #2   Title Pt will improve FGA score to at least 24/30 to decrease fall risk.    Baseline 15/24; 21/24 06/10/2021    Time 6    Status On-going   21/30 on 02/07   Target Date 09/02/21      PT LONG TERM GOAL #3   Title Patient to complete 474m on 6 minute walk test with LRAD. (>1300 ft)    Baseline 1206 ft    Time 6    Period Weeks    Status On-going   1209 ft on 07/28/21   Target Date 09/02/21      PT LONG TERM GOAL #4   Title Pt will verbalize plans for continued community fitness upon d/c from PT to maximize gains made in therapy.    Time 6    Period Weeks    Status Partially Met   currently walking 2-3x/week but has not started gym activities d/t low motivation   Target Date 09/02/21                   Plan - 07/28/21 1707     Clinical Impression Statement Patient arrived to session with report of receiving workup at PCP office which was clear. Rib x-ray and head CT clear of acute abnormalities. 6 minute walk test was assessed and revealed distance consistence with last measurement, however patient reported feeling slightly more winded. Tendency to clean L and posterior with tandem with difficulty correcting without reaching strategy. Tendency for L LOB on rockerboard as well, with good ability  to correct. Backwards stepping over obstacle required 1 episodes of mod-max A to recover LOB. Advised patient not to practice this activity at home d/t safety, but will need to continue practicing backwards stepping in sessions. Patient reported understanding and without complaints at end of session.    Comorbidities PMH: pAF on Eliquis, chronic pain on oxycodone and Ambien, previous SDH, COPD on home O2 noct, HTN, hypothyroidism, OSA on CPAP, and DM, peripheral neuropathy    PT Frequency 1x / week    PT Duration 8 weeks    PT Treatment/Interventions ADLs/Self Care Home Management;Gait training;Functional mobility training;Therapeutic activities;Therapeutic exercise;Balance training;Neuromuscular re-education;Patient/family education    PT Next Visit Plan continue turning activities and backwards stepping/walking, vestibular training for balance, lateral weigthshifting and hip strengthening, gait with head turns/nods and compliant surfaces.  Try resisted gait and side step activities for balance recovery    Consulted and Agree with Plan of Care Patient             Patient  will benefit from skilled therapeutic intervention in order to improve the following deficits and impairments:  Abnormal gait, Difficulty walking, Decreased balance, Decreased mobility, Decreased strength  Visit Diagnosis: Unsteadiness on feet  Muscle weakness (generalized)  Other abnormalities of gait and mobility     Problem List Patient Active Problem List   Diagnosis Date Noted   Fall 07/25/2021   Confusion 07/25/2021   Unsteadiness 07/25/2021   Rib pain on left side 07/25/2021   Aortic atherosclerosis ([place]) 02/04/2021   Mild cognitive impairment 01/04/2021   Fatty liver 12/29/2020   Thrombocytopenia 12/20/2020   Urinary frequency 12/20/2020   Arthritis of hand 11/08/2020   Generalized anxiety disorder 06/12/2020   Recurrent falls 03/25/2020   History of SDH 03/21/2020   Rotator cuff arthropathy of left  shoulder 07/21/2019   Dupuytren's disease of palm 07/12/2019   Sebaceous cyst 06/27/2019   History of COVID-19 05/08/2019   Obstructive sleep apnea    Asymmetrical left sensorineural hearing loss 07/12/2018   Chronic anticoagulation 12/21/2017   Degeneration of thoracic intervertebral disc 09/21/2017   Thoracic back pain 08/10/2017   Degenerative joint disease of hand 07/14/2017   Osteoarthritis    Fibromyalgia    Fatty tumor    BPH (benign prostatic hyperplasia)    Chronic atrial fibrillation    Insomnia 03/10/2017   Chronic pain, legs and back 03/10/2017   Hyperlipidemia 03/10/2017   Obesity (BMI 30.0-34.9) 12/08/2016   Depression 09/07/2016   Spondylolisthesis 09/07/2016   Hypothyroidism 03/08/2016   GERD (gastroesophageal reflux disease) 03/08/2016   Gout 03/08/2016   Glaucoma 03/08/2016   Type 2 diabetes mellitus 03/08/2016   Hereditary and idiopathic peripheral neuropathy 08/28/2015   History of TIA 08/28/2015   Essential hypertension 12/04/2011   Allergic rhinitis, seasonal 12/04/2011   COPD (chronic obstructive pulmonary disease)  07/18/2011    Janene Harvey, PT, DPT 07/28/21 5:11 PM   [facility] [address], [place], [name], [address] Phone: [phone]   Fax:  [phone]  Name: Jonathan [name] MRN: [id number] Date of Birth: [birth year]

## 2021-07-29 ENCOUNTER — Encounter: Payer: Self-pay | Admitting: Internal Medicine

## 2021-07-29 ENCOUNTER — Ambulatory Visit: Payer: PPO | Admitting: Internal Medicine

## 2021-07-29 ENCOUNTER — Encounter: Payer: Self-pay | Admitting: Cardiology

## 2021-07-29 ENCOUNTER — Ambulatory Visit (INDEPENDENT_AMBULATORY_CARE_PROVIDER_SITE_OTHER): Payer: PPO

## 2021-07-29 DIAGNOSIS — R55 Syncope and collapse: Secondary | ICD-10-CM

## 2021-07-29 DIAGNOSIS — I480 Paroxysmal atrial fibrillation: Secondary | ICD-10-CM

## 2021-07-29 MED ORDER — DILTIAZEM HCL ER COATED BEADS 240 MG PO CP24: 240.0000 mg | ORAL_CAPSULE | Freq: Every day | ORAL | 3 refills | Status: DC

## 2021-07-29 NOTE — Telephone Encounter (Signed)
Decrease Cardizem to 240 mg daily.  Check 14-day Zio patch.  Follow blood pressure and pulse.  Schedule follow-up with APP.  Kirk Ruths

## 2021-07-29 NOTE — Telephone Encounter (Signed)
Spoke with patient who reports while walking last wee up a hill, he started to lean forward, tried to straighten up and then fell, hitting his face and ribs. He stated he "fainted" for a brief second during the fall. He saw his PCP who ordered head ct and CXR. She recommended he see a neurologist as well. Patient's concern is if the amiodarone caused him to "faint". Please advise.

## 2021-07-29 NOTE — Progress Notes (Unsigned)
Enrolled for [employer] to mail a ZIO XT long term holter monitor to the patients address on file.  

## 2021-07-30 ENCOUNTER — Other Ambulatory Visit: Payer: Self-pay

## 2021-07-30 ENCOUNTER — Ambulatory Visit (INDEPENDENT_AMBULATORY_CARE_PROVIDER_SITE_OTHER): Payer: PPO | Admitting: Physician Assistant

## 2021-07-30 ENCOUNTER — Other Ambulatory Visit (INDEPENDENT_AMBULATORY_CARE_PROVIDER_SITE_OTHER): Payer: PPO

## 2021-07-30 ENCOUNTER — Other Ambulatory Visit: Payer: PPO

## 2021-07-30 ENCOUNTER — Encounter: Payer: Self-pay | Admitting: Physician Assistant

## 2021-07-30 VITALS — BP 104/70 | HR 61 | Resp 20 | Ht 66.0 in | Wt 173.0 lb

## 2021-07-30 DIAGNOSIS — D696 Thrombocytopenia, unspecified: Secondary | ICD-10-CM

## 2021-07-30 DIAGNOSIS — R4189 Other symptoms and signs involving cognitive functions and awareness: Secondary | ICD-10-CM | POA: Diagnosis not present

## 2021-07-30 DIAGNOSIS — R413 Other amnesia: Secondary | ICD-10-CM | POA: Diagnosis not present

## 2021-07-30 DIAGNOSIS — Z79899 Other long term (current) drug therapy: Secondary | ICD-10-CM | POA: Diagnosis not present

## 2021-07-30 LAB — VITAMIN B12: Vitamin B-12: 624 pg/mL (ref 211–911)

## 2021-07-30 NOTE — Progress Notes (Addendum)
Assessment/Plan:   Jonathan [name] is a very pleasant 88 y.o. year old RH male with extensive medical history including aortic atherosclerosis, PAF on chronic anticoagulation, hypertension, history of TIA, COPD, diabetes, OSA not on CPAP, ITP (last platelet count 97), fibromyalgia, ADD-ADHD, GAD, hypothyroidism, insomnia, major depressive disorder, arthritis, history of SDH 04/03/2020 chronic back pain, DM2 and history of recurrent falls on physical therapy, history of aphasia, ongoing speech therapy, alcohol abuse, polypharmacy due to multiple medical problems s seen today for evaluation of memory loss. MoCA today 27/30, delayed recall 4/5.    Recommendations:   Cognitive deficits of unclear neurological etiology  MRI brain with/without contrast to assess for underlying structural abnormality and assess vascular load  Check B12, B1 Discussed safety both in and out of the home.  Discussed the importance of regular daily schedule to maintain brain function.  Continue to monitor mood with PCP.  Monitor Driving Stay active at least 30 minutes at least 3 times a week.  Naps should be scheduled and should be no longer than 60 minutes and should not occur after 2 PM.  Control cardiovascular risk factors  Repeat neuropsychological evaluation to assess the trajectory of future cognitive decline should it occur. This will also aid in future efforts towards improved diagnostic clarity. Mediterranean diet is recommended  Recommend compliance with CPAP Folllow up after neurocognitive testing  Subjective:   The patient is seen in neurologic consultation at the request of [name], Jonathan Lick, MD for the evaluation of memory.  The patient is here alone. Jonathan [name] is a very pleasant 88 year old man with extensive medical history including type 2 diabetes, atrial fibrillation, hypertension, insomnia, hypothyroidism, hyperlipidemia, gout, chronic back pain, arthritis, GAD, OSA, and undiagnosed depression  presenting here for evaluation of memory difficulties.  He had a comprehensive neurological evaluation by Jonathan [name] in 2018 with normal results, without impaired performances across testing.  At the time, his subjective cognitive difficulties were felt to be likely a combination of various medical and psychiatric conditions.  In 2020, his PCP referred him for repeat neuro testing due to subjective worsening of his memory.  He is Neuropsych evaluation on 02/15/2019 continued to suggest neuropsychological functioning largely within normal limits, perhaps with a slight decline in day-to-day cognitive abilities, included ongoing psychiatric distress, and longstanding traits of ADHD.  Neuroimaging at the time suggested chronic small vessel ischemic changes in the cerebral white matter and a history of subdural hematoma.  Also his memory was affected by medical comorbidities and medication side effects of polypharmacy. On 12/20/2020, he saw Jonathan [name] in follow-up and he expressed a desire at that time for repeat neuropsychological evaluation due to subjective cognitive decline.  Extensive testing in July 2022 yielded a diagnosis of mild cognitive impairment, of unclear neurological etiology.  He had reports in today's visit decreased short-term memory, worse remembering details of prior conversations, forgetting to complete started task, and trouble remembering the names of familiar people. He denies any long-term memory issues.  He does report attention and concentration difficulties,never formally diagnosed with ADHD, he reports having been told he had issues with attention since adolescence.  These include trouble maintaining focus, easy distractibility, unable to finish projects, needing to reread passages several times, and instances of impulsivity.  For the last year, he has to write down his task otherwise he will forget.  He reports brain fog episodes.  His mood has moments of depression, he did report that his  wife commented on him being more sarcastic  than before.  He takes care of her as she is wheelchair-bound and he has to do all the activities alone.  He denies difficulty with understanding or comprehending sentences.  He does have problems with word finding, worse over the last year.  Also, he has some perception issues, "bumping into things such as door frames, especially in the left shoulder, and is working with physical therapy on this.  He attributes these to peripheral neuropathy and chronic pain, and when in a "zombie state, he drops things ".  He does his own bills, although he may forget to pay some of these bills.  He drives without getting lost.  He denies any recent falls, except in 2019 as he was attempting to wheel his wife down a ramp, without loss of consciousness, but he does acknowledge feeling dazed and confused.  He also had another concussion in 2002.  He did sustain a right parafalcine subdural hematoma with the amount significant mass effect or midline shift.  Another fall in 2021 resulted in small volume intraventricular hemorrhage that had resolved on repeat imaging.  He denies a history of stroke, but had 2 TIAs in May 2019.  He denies any history of seizures, or toxin exposure.  He denies frequent headaches or migraines.  He denies frequent instances of dizziness or vertigo. Patient uses hearing aids for hearing loss, and a few years prior to that he had lost his sense of taste and smell "way before COVID in 2020 and I had to be in the hospital for like a week".  He sleeps well, gets up at different hours, he needs melatonin 10 mg to sleep well.  In the past, he drank a significant amount of alcohol, but he quit 6 months ago, denies withdrawal symptoms.  He uses a CPAP at night, with an O2 concentrator, which helps him feel more rested in the morning.  When he sleeps, he denies having vivid dreams, hallucinations or paranoia.  He may had some acting outdreams, a lot of them sexual.  For  his mood, he tried psychotherapy in the past, which was "ineffective, not doing therapy, I am a therapist myself, and I feel much better ". Family History AD mother. Master's degree in Theology from [employer] in [place], [place].. Daughter in law a local GI MD, son a local Internal Medicine MD    Pertinent imaging Brain MRI on 03/06/2016 revealed mild generalized atrophy and mild periventricular and subcortical small vessel ischemic disease. Brain MRI on 12/23/2016 was stable relative to prior imaging.  Head CT on 03/06/2018 revealed an anterior right parafalcine subdural hematoma without significant mass effect or midline shift in the context of a recent fall. Generalized cerebral volume loss with mild chronic small vessel ischemic changes in the cerebral white matter was also noted.  Follow-up head CT on 04/21/2018 noted the resolution of his subdural hematoma.  Head CT on 05/08/2019 again revealed mild cortical and central atrophy, as well as mild small vessel ischemic changes.  Head CT on 03/14/2020 was stable.   Head CT on 03/16/2020 in the context of a fall revealed a small volume intraventricular hemorrhage in the right lateral ventricle, as well as small bilateral subdural hygromas in the frontal lobes bilaterally. Head CT on 04/01/2020 revealed interval resolution of the intraventricular hemorrhage and near complete resolution of bilateral subdural fluid collection. The small residual fluid collections in the bilateral parietal measured up to 35mm. Head CT 07/25/2021 with chronic matter ischemic change. No evidence of  acute infarction, hemorrhage, hydrocephalus, extra-axial collection or TSH mass lesion/mass effect.  Neuropsych evaluation 01/03/21  Dr. Melvyn Novas "Briefly, results suggested significant performance variability across numerous cognitive domains including processing speed, executive functioning, confrontation naming, visuospatial abilities, and learning and memory. No consistent  impairments were exhibited. Relative to his previous evaluation in September 2020, there was a large degree of stability when looking by overall domain. While variable, visuospatial abilities did appear to exhibit a mild decline, as did cognitive flexibility. However, outside of this, a consistent decline between the two evaluations cannot be discerned. Relative to most recent neuroimaging, his right-sided intraventricular hemorrhage and bilateral frontal lobe subdural hygromas sustained during a fall this past October could offer an explanation for visuospatial and cognitive flexibility decline, as well as greater subjective dysfunction. Additional explanations for experienced day-to-day cognitive difficulties continued to include ongoing psychiatric distress (i.e., mild levels of anxiety and depression) and sleep dysfunction, longstanding traits of ADHD (despite not warranting a diagnosis), chronic pain, and neuroimaging suggesting chronic small vessel ischemic changes in the cerebral white matter and history of a remote subdural hematoma".  Pertinent labs January 2023 A1c 5.8, triglycerides 245, TSH 4.46, iron 41 with normal ferritin, saturation 9.7  Allergies  Allergen Reactions   Other Other (See Comments)    DUST-Other reaction(s): Respiratory distress    Current Outpatient Medications  Medication Instructions   acetaminophen (TYLENOL) 1,300 mg, Oral, 2 times daily   acyclovir ointment (ZOVIRAX) 5 % 1 application, Topical, As needed   allopurinol (ZYLOPRIM) 300 mg, Oral, Every evening, For gout   amiodarone (PACERONE) 200 mg, Oral, Daily, Take 1 tablet (200 mg) by mouth 1 time daily   atorvastatin (LIPITOR) 20 mg, Oral, Daily, For cholesterol   Calcium Citrate-Vitamin D (CALCIUM + D PO) 1 tablet, Oral, Daily   carboxymethylcellulose (REFRESH PLUS) 0.5 % SOLN 1 drop, Both Eyes, 3 times daily PRN   clobetasol (TEMOVATE) 0.05 % external solution 1 application, Topical, 2 times daily PRN    Continuous Blood Gluc Receiver (FREESTYLE LIBRE 14 DAY READER) DEVI UAD to check sugars.  E11.65   Continuous Blood Gluc Sensor (FREESTYLE LIBRE 14 DAY SENSOR) MISC UAD to check sugars, E11.65   dapagliflozin propanediol (FARXIGA) 10 mg, Oral, Daily before breakfast, For diabetes   diltiazem (CARDIZEM CD) 240 mg, Oral, Daily   diltiazem (CARDIZEM) 30 mg, Oral, 2 times daily PRN   ELIQUIS 5 MG TABS tablet TAKE ONE TABLET BY MOUTH TWICE A DAY   Emollient (RA RENEWAL DARK SPOT CORRECTOR EX) 1 application, Apply externally, Daily PRN   FLUoxetine (PROZAC) 40 mg, Oral, Daily, For depression   Fluticasone Propionate (ALLERGY RELIEF NA) 2 sprays, Nasal, Daily PRN   Gemtesa 75 mg, Oral, Daily   glucose blood test strip Use to check blood sugar daily. E11.9   hydrochlorothiazide (HYDRODIURIL) 12.5 mg, Oral, Daily   Lancets MISC Use to check blood sugars daily. E11.9   latanoprost (XALATAN) 0.005 % ophthalmic solution 1 drop, Both Eyes, Daily at bedtime   levothyroxine (SYNTHROID) 112 MCG tablet TAKE ONE TABLET BY MOUTH DAILY BEFORE BREAKFAST   melatonin 5 mg, Oral, Daily at bedtime   mometasone (NASONEX) 50 MCG/ACT nasal spray 2 sprays, Nasal, As needed   Multiple Vitamin (MULTIVITAMIN) tablet 1 tablet, Oral, Daily   nitroGLYCERIN (NITROSTAT) 0.4 mg, Sublingual, Every 5 min PRN   omeprazole (PRILOSEC) 20 MG capsule TAKE ONE CAPSULE BY MOUTH DAILY   oxyCODONE-acetaminophen (PERCOCET) 10-325 MG tablet 1 tablet, Every 8 hours PRN   pregabalin (  LYRICA) 75 mg, Oral, 2 times daily   Probiotic Product (PROBIOTIC PO) 1 capsule, Oral, Daily   RESTASIS 0.05 % ophthalmic emulsion 1 drop, Both Eyes, 2 times daily   Rybelsus 7 mg, Oral, Daily, Via [employer] pt assistance   sildenafil (REVATIO) 40-100 mg, Oral, Daily PRN   silodosin (RAPAFLO) 8 mg, Oral, Daily   telmisartan (MICARDIS) 20 mg, Oral, Daily   vitamin C (ASCORBIC ACID) 500 mg, Oral, Daily   VITAMIN D, CHOLECALCIFEROL, PO 1 capsule, Oral, Daily      VITALS:   Vitals:   07/30/21 0807  BP: 104/70  Pulse: 61  Resp: 20  SpO2: 97%  Weight: 173 lb (78.5 kg)  Height: 5\' 6"  (1.676 m)    PHYSICAL EXAM   HEENT:  Normocephalic, atraumatic. The mucous membranes are moist. The superficial temporal arteries are without ropiness or tenderness. Cardiovascular: Regular rate and rhythm. Lungs: Clear to auscultation bilaterally. Neck: There are no carotid bruits noted bilaterally.  NEUROLOGICAL: Montreal Cognitive Assessment  07/30/2021  Visuospatial/ Executive (0/5) 4  Naming (0/3) 3  Attention: Read list of digits (0/2) 2  Attention: Read list of letters (0/1) 1  Attention: Serial 7 subtraction starting at 100 (0/3) 3  Language: Repeat phrase (0/2) 2  Language : Fluency (0/1) 1  Abstraction (0/2) 1  Delayed Recall (0/5) 4  Orientation (0/6) 6  Total 27  Adjusted Score (based on education) 27   MMSE - [place] Exam 04/26/2019 04/20/2018 04/19/2017  Not completed: Refused - Refused  Orientation to time - 5 -  Orientation to Place - 5 -  Registration - 3 -  Attention/ Calculation - 5 -  Recall - 2 -  Language- name 2 objects - 2 -  Language- repeat - 1 -  Language- follow 3 step command - 3 -  Language- read & follow direction - 1 -  Write a sentence - 1 -  Copy design - 1 -  Total score - 29 -    No flowsheet data found.   Orientation:  Alert and oriented to person, place and time. No aphasia or dysarthria. Fund of knowledge is appropriate. Recent memory mildly impaired and remote memory intact.  Attention and concentration are normal.  Able to name objects and repeat phrases. Delayed recall   Cranial nerves: There is good facial symmetry. Extraocular muscles are intact and visual fields are full to confrontational testing. Speech is fluent and clear. Soft palate rises symmetrically and there is no tongue deviation. Hearing is intact to conversational tone. Tone: Tone is good throughout. Sensation: Sensation is  intact to light touch and pinprick throughout. Vibration is intact at the bilateral big toe.There is no extinction with double simultaneous stimulation. There is no sensory dermatomal level identified. Coordination: The patient has no difficulty with RAM's or FNF bilaterally. Normal finger to nose  Motor: Strength is 5/5 in the bilateral upper and lower extremities. There is no pronator drift. There are no fasciculations noted. DTR's: Deep tendon reflexes are 2/4 at the bilateral biceps, triceps, brachioradialis, patella and achilles.  Plantar responses are downgoing bilaterally. Gait and Station: The patient is able to ambulate without difficulty.The patient is able to heel toe walk without any difficulty.The patient is able to ambulate in a tandem fashion. The patient is able to stand in the Romberg position.     Thank you for allowing [place] the opportunity to participate in the care of this nice patient. Please do not hesitate to contact us  for any questions or concerns.   Total time spent on today's visit was 70  minutes, including both face-to-face time and nonface-to-face time.  Time included that spent on review of records (prior notes available to me/labs/imaging if pertinent), discussing treatment and goals, answering patient's questions and coordinating care.  Cc:  Binnie Rail, MD  Sharene Butters 07/30/2021 8:54 AM

## 2021-07-30 NOTE — Patient Instructions (Addendum)
It was a pleasure to see you today at our office.   Recommendations:  Follow up in 6 months after neurocognitive testing Repeat the neurocognitive testing  MRI brain  Continue PT  Labs today     RECOMMENDATIONS FOR ALL PATIENTS WITH MEMORY PROBLEMS: 1. Continue to exercise (Recommend 30 minutes of walking everyday, or 3 hours every week) 2. Increase social interactions - continue going to [place] and enjoy social gatherings with friends and family 3. Eat healthy, avoid fried foods and eat more fruits and vegetables 4. Maintain adequate blood pressure, blood sugar, and blood cholesterol level. Reducing the risk of stroke and cardiovascular disease also helps promoting better memory. 5. Avoid stressful situations. Live a simple life and avoid aggravations. Organize your time and prepare for the next day in anticipation. 6. Sleep well, avoid any interruptions of sleep and avoid any distractions in the bedroom that may interfere with adequate sleep quality 7. Avoid sugar, avoid sweets as there is a strong link between excessive sugar intake, diabetes, and cognitive impairment We discussed the Mediterranean diet, which has been shown to help patients reduce the risk of progressive memory disorders and reduces cardiovascular risk. This includes eating fish, eat fruits and green leafy vegetables, nuts like almonds and hazelnuts, walnuts, and also use olive oil. Avoid fast foods and fried foods as much as possible. Avoid sweets and sugar as sugar use has been linked to worsening of memory function.  There is always a concern of gradual progression of memory problems. If this is the case, then we may need to adjust level of care according to patient needs. Support, both to the patient and caregiver, should then be put into place.    FALL PRECAUTIONS: Be cautious when walking. Scan the area for obstacles that may increase the risk of trips and falls. When getting up in the mornings, sit up at the edge  of the bed for a few minutes before getting out of bed. Consider elevating the bed at the head end to avoid drop of blood pressure when getting up. Walk always in a well-lit room (use night lights in the walls). Avoid area rugs or power cords from appliances in the middle of the walkways. Use a walker or a cane if necessary and consider physical therapy for balance exercise. Get your eyesight checked regularly.  FINANCIAL OVERSIGHT: Supervision, especially oversight when making financial decisions or transactions is also recommended.  HOME SAFETY: Consider the safety of the kitchen when operating appliances like stoves, microwave oven, and blender. Consider having supervision and share cooking responsibilities until no longer able to participate in those. Accidents with firearms and other hazards in the house should be identified and addressed as well.   ABILITY TO BE LEFT ALONE: If patient is unable to contact 911 operator, consider using LifeLine, or when the need is there, arrange for someone to stay with patients. Smoking is a fire hazard, consider supervision or cessation. Risk of wandering should be assessed by caregiver and if detected at any point, supervision and safe proof recommendations should be instituted.  MEDICATION SUPERVISION: Inability to self-administer medication needs to be constantly addressed. Implement a mechanism to ensure safe administration of the medications.   DRIVING: Regarding driving, in patients with progressive memory problems, driving will be impaired. We advise to have someone else do the driving if trouble finding directions or if minor accidents are reported. Independent driving assessment is available to determine safety of driving.   If you are interested in the  driving assessment, you can contact the following:  [facility] [employer] in [place]  [place] [phone]  [facility]  [phone] or [phone] We have sent a referral to [facility] for your MRI and they will call you directly to schedule your appointment. They are located at [place]. If you need to contact them directly please call [phone].  Your provider has requested that you have labwork completed today. Please go to [facility] ([id number]) on the second floor of this building before leaving the office today. You do not need to check in. If you are not called within 15 minutes please check with the front desk.

## 2021-07-31 ENCOUNTER — Encounter: Payer: Self-pay | Admitting: Cardiology

## 2021-07-31 ENCOUNTER — Ambulatory Visit (INDEPENDENT_AMBULATORY_CARE_PROVIDER_SITE_OTHER): Payer: PPO | Admitting: Cardiology

## 2021-07-31 ENCOUNTER — Encounter: Payer: Self-pay | Admitting: Internal Medicine

## 2021-07-31 VITALS — BP 150/66 | HR 54 | Ht 66.0 in | Wt 173.8 lb

## 2021-07-31 DIAGNOSIS — I10 Essential (primary) hypertension: Secondary | ICD-10-CM

## 2021-07-31 DIAGNOSIS — I480 Paroxysmal atrial fibrillation: Secondary | ICD-10-CM | POA: Diagnosis not present

## 2021-07-31 DIAGNOSIS — R079 Chest pain, unspecified: Secondary | ICD-10-CM | POA: Diagnosis not present

## 2021-07-31 DIAGNOSIS — Z7901 Long term (current) use of anticoagulants: Secondary | ICD-10-CM | POA: Diagnosis not present

## 2021-07-31 LAB — COMPREHENSIVE METABOLIC PANEL
ALT: 37 IU/L (ref 0–44)
AST: 21 IU/L (ref 0–40)
Albumin/Globulin Ratio: 2.8 — ABNORMAL HIGH (ref 1.2–2.2)
Albumin: 4.7 g/dL — ABNORMAL HIGH (ref 3.6–4.6)
Alkaline Phosphatase: 130 IU/L — ABNORMAL HIGH (ref 44–121)
BUN/Creatinine Ratio: 17 (ref 10–24)
BUN: 17 mg/dL (ref 8–27)
Bilirubin Total: 0.3 mg/dL (ref 0.0–1.2)
CO2: 23 mmol/L (ref 20–29)
Calcium: 9.3 mg/dL (ref 8.6–10.2)
Chloride: 109 mmol/L — ABNORMAL HIGH (ref 96–106)
Creatinine, Ser: 1.02 mg/dL (ref 0.76–1.27)
Globulin, Total: 1.7 g/dL (ref 1.5–4.5)
Glucose: 113 mg/dL — ABNORMAL HIGH (ref 70–99)
Potassium: 4.1 mmol/L (ref 3.5–5.2)
Sodium: 148 mmol/L — ABNORMAL HIGH (ref 134–144)
Total Protein: 6.4 g/dL (ref 6.0–8.5)
eGFR: 72 mL/min/{1.73_m2} (ref >59)

## 2021-07-31 LAB — TSH: TSH: 4.2 u[IU]/mL (ref 0.450–4.500)

## 2021-07-31 MED ORDER — NITROGLYCERIN 0.4 MG SL SUBL
0.4000 mg | SUBLINGUAL_TABLET | SUBLINGUAL | 3 refills | Status: DC | PRN
Start: 2021-07-31 — End: 2023-11-25

## 2021-07-31 NOTE — Patient Instructions (Signed)
Medication Instructions:  Continue same medications *If you need a refill on your cardiac medications before your next appointment, please call your pharmacy*   Lab Work: None ordered   Testing/Procedures: Lexiscan Myoview   Follow-Up: At [facility], you and your health needs are our priority.  As part of our continuing mission to provide you with exceptional heart care, we have created designated Provider Care Teams.  These Care Teams include your primary Cardiologist (physician) and Advanced Practice Providers (APPs -  Physician Assistants and Nurse Practitioners) who all work together to provide you with the care you need, when you need it.  We recommend signing up for the patient portal called "MyChart".  Sign up information is provided on this After Visit Summary.  MyChart is used to connect with patients for Virtual Visits (Telemedicine).  Patients are able to view lab/test results, encounter notes, upcoming appointments, etc.  Non-urgent messages can be sent to your provider as well.   To learn more about what you can do with MyChart, go to [email or web address].    Your next appointment:  To Be Determined    The format for your next appointment: Office   Provider:  Dr.Crenshaw

## 2021-07-31 NOTE — Progress Notes (Signed)
Cardiology Office Note   Date:  07/31/2021   ID:  Jonathan [name], DOB 19-May-1933, MRN [id number]  PCP:  Binnie Rail, MD  Cardiologist:  Kirk Ruths MD  Chief Complaint  Patient presents with   Chest Pain      History of Present Illness: Jonathan [name] is a 88 y.o. male who presents as a work in for evaluation of chest pain. He has a hx of aortic atherosclerosis, paroxysmal atrial fib on chronic anticoagulaiton, HTN, TIA, COPD, GERD, diabetes, mild to moderate MR, and ideopathic thrombocytopenia, and fibromyalgia.  He has been followed primarily for PAF. A myoview in 2016 was low risk with no evidence of ischemia. He has worn multiple monitors over the past few years, most recently in 12/21, that revealed PACs, PVCs, brief episodes of NSVT, PAT, an PAF. Anticoagulation was temporarily stopped due to recurrent SDH in 2019 and again in 2021 following falls. Anticoagulation was ultimately restarted given prior history of TIA. Echo in November revealed LVEF 60-65% with normal wall motion and normal diastolic parameters, normal RV, and mild to moderate MR. Seen in January with Afib. Was started on amiodarone with plans for DCCV but he converted to NSR. When seen 2 weeks ago complained predominantly of fatigue. Considerable stress with taking care of wife who is almost completely disabled and preparing to move into [facility].   This morning he awoke at 3 am with substernal chest pain. He took 75 very old sl Ntg and got relief after about 10 minutes. Went back to sleep and got up at 6 am with recurrent chest pain. Took one Ntg with relief after 5 minutes. No SOB or palpitations but did note his HR went up to 130 on his watch. Notes he fell a couple of weeks ago and hurt his ribs.   Past Medical History:  Diagnosis Date   Acute encephalopathy 05/08/2019   Allergic rhinitis, seasonal 12/04/2011   Arthritis of hand 11/08/2020   BPH (benign prostatic hyperplasia)    Bronchitis     Cataract    Chronic anticoagulation 12/21/2017   CHADS VASC=4, Eliquis stopped Sept 2021- recurrent falls with SDH   Chronic atrial fibrillation    On Eliquis   Chronic pain, legs and back 03/10/2017   Preferred pain management Leg and back pain   Compression fracture of thoracic vertebra 09/21/2017   COPD (chronic obstructive pulmonary disease)    2 liters O2 HS   Degeneration of thoracic intervertebral disc 09/21/2017   Degenerative joint disease of hand 07/14/2017   Dupuytren's disease of palm 07/12/2019   Essential hypertension 12/04/2011   Fatty tumor    waste and back   Fibromyalgia    Generalized anxiety disorder 06/12/2020   12/21 He is reporting severe insomnia and anxiety symptoms.  Options are limited.  We will try a low-dose lorazepam at night and during the day for anxiety and panic attacks.  He will stop lorazepam if problems.  He will stop drinking alcohol.  Discontinue BuSpar.  Reduce Cymbalta to 1 a day.   GERD (gastroesophageal reflux disease)    Glaucoma 03/08/2016   Gout 03/08/2016   Hereditary and idiopathic peripheral neuropathy 08/28/2015   History of COVID-19 05/08/2019   2021 Post-COVID sx's -" brain fog" Try Lion's mane supplement and B complex with niacin for neuropathy   Hyperlipidemia 03/10/2017   Hypothyroidism    Hypoxia    Insomnia    Leukocytosis 03/14/2020   Major depressive disorder 09/07/2016   Obstructive  sleep apnea    Osteoarthritis    Osteoporosis    Recurrent falls 03/25/2020   PT. Treat neuropathy, insomnia.  Reduce Cymbalta to 1 a day.  Discontinue BuSpar.   Rotator cuff arthropathy of left shoulder 07/21/2019   SDH (subdural hematoma) 03/21/2020   Recurrent SDH secondary to falls at home- Sept 2019 and again 03/14/2020- Eliquis stopped.   Sebaceous cyst 06/27/2019   Spinal compression fracture seventh vertebre   Spondylolisthesis 09/07/2016   On fosamax - for about one year - Dr Alyson Ingles  10/14/17 dexa: normal dexa -- spine 2.1,   RFN  -0.9,  LFN   -0.7   - no comparison on file, previous fracture      Thoracic back pain 08/10/2017   Thrombocytopenia 12/20/2020   TIA (transient ischemic attack) 08/28/2015   Type 2 diabetes mellitus 03/08/2016    Past Surgical History:  Procedure Laterality Date   APPENDECTOMY  age 65   [place] Right    early 2000s   CATARACT EXTRACTION     bilateral   CHOLECYSTECTOMY  age 51   EYE SURGERY     FOOT ARTHRODESIS Right 02/02/2013   Procedure: RIGHT HALLUX METATARSAL PHALANGEAL JOINT ARTHRODESIS ;  Surgeon: Wylene Simmer, MD;  Location: [facility];  Service: Orthopedics;  Laterality: Right;   INGUINAL HERNIA REPAIR  age 26   rt side   NASAL CONCHA BULLOSA RESECTION  age 37   PROSTATE SURGERY     SHOULDER ARTHROSCOPY W/ ROTATOR CUFF REPAIR Right    early 2000s   tonsil     VASECTOMY  age 35     Current Outpatient Medications  Medication Sig Dispense Refill   acetaminophen (TYLENOL) 650 MG CR tablet Take 1,300 mg by mouth in the morning and at bedtime.     acyclovir ointment (ZOVIRAX) 5 % Apply 1 application topically as needed.     allopurinol (ZYLOPRIM) 300 MG tablet Take 1 tablet (300 mg total) by mouth every evening. For gout 90 tablet 3   amiodarone (PACERONE) 200 MG tablet Take 1 tablet (200 mg total) by mouth daily. Take 1 tablet (200 mg) by mouth 1 time daily 90 tablet 3   atorvastatin (LIPITOR) 20 MG tablet Take 1 tablet (20 mg total) by mouth daily. For cholesterol 90 tablet 1   Calcium Citrate-Vitamin D (CALCIUM + D PO) Take 1 tablet by mouth daily.     carboxymethylcellulose (REFRESH PLUS) 0.5 % SOLN Place 1 drop into both eyes 3 (three) times daily as needed (dry eyes).     Continuous Blood Gluc Receiver (FREESTYLE LIBRE 14 DAY READER) DEVI UAD to check sugars.  E11.65 1 each 0   Continuous Blood Gluc Sensor (FREESTYLE LIBRE 14 DAY SENSOR) MISC UAD to check sugars, E11.65 2 each 5   dapagliflozin propanediol (FARXIGA) 10 MG TABS tablet Take 1  tablet (10 mg total) by mouth daily before breakfast. For diabetes 30 tablet 5   diltiazem (CARDIZEM CD) 240 MG 24 hr capsule Take 1 capsule (240 mg total) by mouth daily. 90 capsule 3   ELIQUIS 5 MG TABS tablet TAKE ONE TABLET BY MOUTH TWICE A DAY 180 tablet 1   Emollient (RA RENEWAL DARK SPOT CORRECTOR EX) Apply 1 application topically daily as needed (dark spots).     FLUoxetine (PROZAC) 40 MG capsule Take 1 capsule (40 mg total) by mouth daily. For depression 90 capsule 3   Fluticasone Propionate (ALLERGY RELIEF NA) Place 2 sprays into the nose  daily as needed (allergies).     glucose blood test strip Use to check blood sugar daily. E11.9 100 each 3   hydrochlorothiazide (HYDRODIURIL) 12.5 MG tablet Take 1 tablet (12.5 mg total) by mouth daily. 90 tablet 3   Lancets MISC Use to check blood sugars daily. E11.9 100 each 3   latanoprost (XALATAN) 0.005 % ophthalmic solution Place 1 drop into both eyes at bedtime.     levothyroxine (SYNTHROID) 112 MCG tablet TAKE ONE TABLET BY MOUTH DAILY BEFORE BREAKFAST 90 tablet 1   Melatonin 5 MG TABS Take 5 mg by mouth at bedtime.     Multiple Vitamin (MULTIVITAMIN) tablet Take 1 tablet by mouth daily.     omeprazole (PRILOSEC) 20 MG capsule TAKE ONE CAPSULE BY MOUTH DAILY 90 capsule 1   oxyCODONE-acetaminophen (PERCOCET) 10-325 MG tablet Take 1 tablet by mouth every 8 (eight) hours as needed for pain.     pregabalin (LYRICA) 75 MG capsule Take 1 capsule (75 mg total) by mouth 2 (two) times daily. 60 capsule 0   Probiotic Product (PROBIOTIC PO) Take 1 capsule by mouth daily.     RESTASIS 0.05 % ophthalmic emulsion Place 1 drop into both eyes 2 (two) times daily.      Semaglutide (RYBELSUS) 7 MG TABS Take 7 mg by mouth daily. [employer] pt assistance 30 tablet 3   sildenafil (REVATIO) 20 MG tablet Take 40-100 mg by mouth daily as needed.     silodosin (RAPAFLO) 8 MG CAPS capsule Take 8 mg by mouth daily.     telmisartan (MICARDIS) 20 MG tablet Take 1  tablet (20 mg total) by mouth daily. 30 tablet 5   Vibegron (GEMTESA) 75 MG TABS Take 75 mg by mouth daily.     vitamin C (ASCORBIC ACID) 500 MG tablet Take 500 mg by mouth daily.     VITAMIN D, CHOLECALCIFEROL, PO Take 1 capsule by mouth daily.     clobetasol (TEMOVATE) 0.05 % external solution Apply 1 application topically 2 (two) times daily as needed (rash). (Patient not taking: Reported on 07/31/2021)     diltiazem (CARDIZEM) 30 MG tablet Take 1 tablet (30 mg total) by mouth 2 (two) times daily as needed (sustained elevated heart rates >100 bpm for > 20 minutes.). (Patient not taking: Reported on 07/31/2021) 30 tablet 2   mometasone (NASONEX) 50 MCG/ACT nasal spray Place 2 sprays into the nose as needed (allergies). (Patient not taking: Reported on 07/31/2021) 17 g 5   nitroGLYCERIN (NITROSTAT) 0.4 MG SL tablet Place 1 tablet (0.4 mg total) under the tongue every 5 (five) minutes as needed for chest pain. 90 tablet 3   No current facility-administered medications for this visit.    Allergies:   Other    Social History:  The patient  reports that he quit smoking about 46 years ago. His smoking use included cigarettes. He has a 20.00 pack-year smoking history. He has never used smokeless tobacco. He reports current alcohol use. He reports that he does not use drugs.   Family History:  The patient's family history includes Arthritis in his mother; Coronary artery disease in his brother; Dementia in his mother; Heart disease in his father; Kidney cancer in his father; Lung cancer in his father and mother; Prostate cancer in his father.    ROS:  Please see the history of present illness.   Otherwise, review of systems are positive for none.   All other systems are reviewed and negative.  PHYSICAL EXAM: VS:  BP (!) 150/66 (BP Location: Left Arm, Patient Position: Sitting, Cuff Size: Normal)    Pulse (!) 54    Ht 5\' 6"  (1.676 m)    Wt 173 lb 12.8 oz (78.8 kg)    SpO2 96%    BMI 28.05 kg/m  ,  BMI Body mass index is 28.05 kg/m. GEN: Well nourished, elderly, in no acute distress HEENT: normal Neck: no JVD, carotid bruits, or masses Cardiac: RRR; no murmurs, rubs, or gallops,no edema  Respiratory:  clear to auscultation bilaterally, normal work of breathing GI: soft, nontender, nondistended, + BS MS: no deformity or atrophy Skin: warm and dry, no rash Neuro:  Strength and sensation are intact Psych: euthymic mood, full affect   EKG:  EKG is ordered today. The ekg ordered today demonstrates NSR rate 54. First degree AV block. Old septal infarct. I have personally reviewed and interpreted this study.    Recent Labs: 07/15/2021: Hemoglobin 13.8; Platelet Count 97 07/30/2021: ALT 37; BUN 17; Creatinine, Ser 1.02; Potassium 4.1; Sodium 148; TSH 4.200    Lipid Panel    Component Value Date/Time   CHOL 100 07/09/2021 0910   TRIG 126.0 07/09/2021 0910   HDL 39.20 07/09/2021 0910   CHOLHDL 3 07/09/2021 0910   VLDL 25.2 07/09/2021 0910   LDLCALC 36 07/09/2021 0910   LDLCALC 65 11/08/2020 1624   LDLDIRECT 51.0 08/14/2019 1028      Wt Readings from Last 3 Encounters:  07/31/21 173 lb 12.8 oz (78.8 kg)  07/30/21 173 lb (78.5 kg)  07/25/21 176 lb 12.8 oz (80.2 kg)      Other studies Reviewed: Additional studies/ records that were reviewed today include:   Echo 05/07/21  Left Ventricle: Left ventricular ejection fraction, by estimation, is 60  to 65%. The left ventricle has normal function. The left ventricle has no  regional wall motion abnormalities. The left ventricular internal cavity  size was normal in size. There is  no left ventricular hypertrophy. Left ventricular diastolic parameters were normal.  Right Ventricle: The right ventricular size is normal. Right vetricular  wall thickness was not well visualized. Right ventricular systolic  function is normal.  Left Atrium: Left atrial size was normal in size.  Right Atrium: Right atrial size was normal in size.   Pericardium: There is no evidence of pericardial effusion.  Mitral Valve: The mitral valve is normal in structure. Mild to moderate  mitral valve regurgitation.  Tricuspid Valve: The tricuspid valve is normal in structure. Tricuspid  valve regurgitation is mild.  Aortic Valve: The aortic valve is normal in structure. Aortic valve  regurgitation is not visualized.  Pulmonic Valve: The pulmonic valve was normal in structure. Pulmonic valve  regurgitation is trivial.  Aorta: The aortic root and ascending aorta are structurally normal, with  no evidence of dilitation.  IAS/Shunts: The atrial septum is grossly normal.    Cardiac monitor 12/21   Sinus rhythm with PVCs, PACs, PAF, PAT and 4 beats NSVT Kirk Ruths   Lexiscan myoview 7/16   Nuclear stress EF: 56%. The left ventricular ejection fraction is normal (55-65%). The study is normal. This is a low risk study.   Nl Lexiscan myoview     ASSESSMENT AND PLAN:  1.  Chest pain. Concerning for angina. Possibly triggered by Afib with increased HR noted on monitor. Now pain free. Ecg is unchanged. Recommend new Rx for Ntg. He has a 2 week event monitor planned already. Will arrange for a lexiscan  Myoview. Instructed if his chest pain recurs to contact us. If no relief with sl Ntg should call 911. 2. PAfib. On Amiodarone, diltiazem, Eliquis. Plan 2 week event monitor.  3. HLD  4. HTN. Controlled.    Current medicines are reviewed at length with the patient today.  The patient does not have concerns regarding medicines.  The following changes have been made:  no change  Labs/ tests ordered today include:   Orders Placed This Encounter  Procedures   Cardiac Stress Test: Informed Consent Details: Physician/Practitioner Attestation; Transcribe to consent form and obtain patient signature   MYOCARDIAL PERFUSION IMAGING   EKG 12-Lead         Disposition:   FU with Sande Rives as scheduled in March pending results of  tests.   Signed, Zac Torti [place], MD  07/31/2021 11:50 AM    [facility] [address] [place], [name], [address] Phone [phone], Fax [phone]

## 2021-07-31 NOTE — Telephone Encounter (Signed)
Spoke with patient. At this time, he is not having active chest pain. He is scheduled to see Dr. [place] DOD at 11:20 today.

## 2021-08-04 ENCOUNTER — Other Ambulatory Visit: Payer: Self-pay

## 2021-08-04 ENCOUNTER — Ambulatory Visit: Payer: PPO | Admitting: Physical Therapy

## 2021-08-04 ENCOUNTER — Encounter: Payer: Self-pay | Admitting: Physical Therapy

## 2021-08-04 DIAGNOSIS — R2689 Other abnormalities of gait and mobility: Secondary | ICD-10-CM

## 2021-08-04 DIAGNOSIS — M6281 Muscle weakness (generalized): Secondary | ICD-10-CM

## 2021-08-04 DIAGNOSIS — R2681 Unsteadiness on feet: Secondary | ICD-10-CM

## 2021-08-04 DIAGNOSIS — R41841 Cognitive communication deficit: Secondary | ICD-10-CM | POA: Diagnosis not present

## 2021-08-04 NOTE — Therapy (Signed)
[facility] [address] [place], [name], [address] Phone: [phone]   Fax:  [phone]  Physical Therapy Treatment  Patient Details  Name: Jonathan [name] MRN: [id number] Date of Birth: 04-Apr-1933 Referring Provider (PT): Lazaro Arms, NP   Encounter Date: 08/04/2021   PT End of Session - 08/04/21 1725     Visit Number 16    Number of Visits 20    Date for PT Re-Evaluation 09/02/21    Authorization Type Medicare/BCBS    Progress Note Due on Visit 19   *Progress note completed on 9th visit   PT Start Time 1623    PT Stop Time 1701    PT Time Calculation (min) 38 min    Equipment Utilized During Treatment Gait belt    Activity Tolerance Patient tolerated treatment well    Behavior During Therapy WFL for tasks assessed/performed             Past Medical History:  Diagnosis Date   Acute encephalopathy 05/08/2019   Allergic rhinitis, seasonal 12/04/2011   Arthritis of hand 11/08/2020   BPH (benign prostatic hyperplasia)    Bronchitis    Cataract    Chronic anticoagulation 12/21/2017   CHADS VASC=4, Eliquis stopped Sept 2021- recurrent falls with SDH   Chronic atrial fibrillation    On Eliquis   Chronic pain, legs and back 03/10/2017   Preferred pain management Leg and back pain   Compression fracture of thoracic vertebra 09/21/2017   COPD (chronic obstructive pulmonary disease)    2 liters O2 HS   Degeneration of thoracic intervertebral disc 09/21/2017   Degenerative joint disease of hand 07/14/2017   Dupuytren's disease of palm 07/12/2019   Essential hypertension 12/04/2011   Fatty tumor    waste and back   Fibromyalgia    Generalized anxiety disorder 06/12/2020   12/21 He is reporting severe insomnia and anxiety symptoms.  Options are limited.  We will try a low-dose lorazepam at night and during the day for anxiety and panic attacks.  He will stop lorazepam if problems.  He will stop drinking alcohol.   Discontinue BuSpar.  Reduce Cymbalta to 1 a day.   GERD (gastroesophageal reflux disease)    Glaucoma 03/08/2016   Gout 03/08/2016   Hereditary and idiopathic peripheral neuropathy 08/28/2015   History of COVID-19 05/08/2019   2021 Post-COVID sx's -" brain fog" Try Lion's mane supplement and B complex with niacin for neuropathy   Hyperlipidemia 03/10/2017   Hypothyroidism    Hypoxia    Insomnia    Leukocytosis 03/14/2020   Major depressive disorder 09/07/2016   Obstructive sleep apnea    Osteoarthritis    Osteoporosis    Recurrent falls 03/25/2020   PT. Treat neuropathy, insomnia.  Reduce Cymbalta to 1 a day.  Discontinue BuSpar.   Rotator cuff arthropathy of left shoulder 07/21/2019   SDH (subdural hematoma) 03/21/2020   Recurrent SDH secondary to falls at home- Sept 2019 and again 03/14/2020- Eliquis stopped.   Sebaceous cyst 06/27/2019   Spinal compression fracture seventh vertebre   Spondylolisthesis 09/07/2016   On fosamax - for about one year - Dr Alyson Ingles  10/14/17 dexa: normal dexa -- spine 2.1,   RFN -0.9,  LFN   -0.7   - no comparison on file, previous fracture      Thoracic back pain 08/10/2017   Thrombocytopenia 12/20/2020   TIA (transient ischemic attack) 08/28/2015   Type 2 diabetes mellitus 03/08/2016  Past Surgical History:  Procedure Laterality Date   APPENDECTOMY  age 51   CARPAL TUNNEL RELEASE Right    early 2000s   CATARACT EXTRACTION     bilateral   CHOLECYSTECTOMY  age 18   EYE SURGERY     FOOT ARTHRODESIS Right 02/02/2013   Procedure: RIGHT HALLUX METATARSAL PHALANGEAL JOINT ARTHRODESIS ;  Surgeon: Wylene Simmer, MD;  Location: [facility];  Service: Orthopedics;  Laterality: Right;   INGUINAL HERNIA REPAIR  age 38   rt side   NASAL CONCHA BULLOSA RESECTION  age 44   PROSTATE SURGERY     SHOULDER ARTHROSCOPY W/ ROTATOR CUFF REPAIR Right    early 2000s   tonsil     VASECTOMY  age 6    There were no vitals filed for this visit.    Subjective Assessment - 08/04/21 1623     Subjective Reports that one night he woke up and had chest pain/pressure. This happened again in the AM and improved with nitro both times. Was seen by Cardiologist's office and work up was negative. Denies any more episodes of chest pressure except for rib pain.    Pertinent History PMH: pAF on Eliquis (to see cardiologist on 04/23/21), chronic pain on oxycodone and Ambien, previous SDH, COPD on home O2 noct, HTN, hypothyroidism, OSA on CPAP, and DM    Diagnostic tests 07/25/21 chest xray negative, head CT showed no acute intracranial abnormality    Patient Stated Goals Pt's goal for therapy is to improve balance, strengthening, and walking.    Currently in Pain? Yes    Pain Score 3     Pain Location Back    Pain Orientation Lower    Pain Descriptors / Indicators Aching    Pain Type Chronic pain                               [facility] Adult PT Treatment/Exercise - 08/04/21 0001       Ambulation/Gait   Ambulation Distance (Feet) 100 Feet    Assistive device --   1 walking pole   Gait Pattern Step-through pattern;Poor foot clearance - right;Poor foot clearance - left;Lateral hip instability    Ambulation Surface Level;Indoor    Gait velocity WFL    Gait Comments gait training with single walking pole and performing turns; good stability and sequencing      Neuro Re-ed    Neuro Re-ed Details  resisted forward walk + backwards step, resisted backwards walk + forwards step in II bars; 4 square step over hurdles   particular imbalance with backwards stepping over hurdle, requiring cueing to shift forward onto toes and cues for foot placement                    PT Education - 08/04/21 1724     Education Details discussion on benefits of walking stick for stability d/t imbalance today and in recent sessions    Person(s) Educated Patient    Methods Explanation;Demonstration;Tactile cues;Verbal cues;Handout     Comprehension Verbalized understanding;Returned demonstration              PT Short Term Goals - 07/22/21 1045       PT SHORT TERM GOAL #1   Title Pt will be independent with progression of HEP for improved strength, balance, transfers, and gait.  TARGET 07/11/2021    Time 3    Period [place]  Status Partially Met   met for current   Target Date 08/12/21      PT SHORT TERM GOAL #2   Title Pt will improve 5x sit<>stand to less than or equal to 11.5 sec to demonstrate improved functional strength and transfer efficiency.    Baseline 12.38 sec with decreased forward lean to initiate; 05/13/21:  15.47 sec, 2nd trial 12.38 sec;    Time 3    Period Weeks    Status On-going   12.14 sec on 07/22/21   Target Date 08/12/21               PT Long Term Goals - 07/28/21 1555       PT LONG TERM GOAL #1   Title Pt will be independent with final HEP for improved strength, balance, transfers, and gait.  TARGET 07/25/2021    Time 6    Period Weeks    Status Partially Met   reports partial compliance; denies questions   Target Date 09/02/21      PT LONG TERM GOAL #2   Title Pt will improve FGA score to at least 24/30 to decrease fall risk.    Baseline 15/24; 21/24 06/10/2021    Time 6    Status On-going   21/30 on 02/07   Target Date 09/02/21      PT LONG TERM GOAL #3   Title Patient to complete 426mon 6 minute walk test with LRAD. (>1300 ft)    Baseline 1206 ft    Time 6    Period Weeks    Status On-going   1209 ft on 07/28/21   Target Date 09/02/21      PT LONG TERM GOAL #4   Title Pt will verbalize plans for continued community fitness upon d/c from PT to maximize gains made in therapy.    Time 6    Period Weeks    Status Partially Met   currently walking 2-3x/week but has not started gym activities d/t low motivation   Target Date 09/02/21                   Plan - 08/04/21 1725     Clinical Impression Statement Patient arrived to session with report of  recent trouble with angina which responded to Nitroglycerin. Received Cardiology work up which was negative and denies further anginal episodes since. Encouraged patient to bring Nitro with him at all sessions. Worked on backwards walking and stepping, with patient demonstrating particular imbalance with backwards stepping over hurdle, requiring cueing to shift forward onto toes and cues for foot placement. [facility] but benefited from this device with subsequent backwards stepping. Proceeded with gait training with single walking pole with good stability and sequencing. Educated patient on benefits of AD for long periods of walking. Patient tolerated session well and without complaints at end of session.    Comorbidities PMH: pAF on Eliquis, chronic pain on oxycodone and Ambien, previous SDH, COPD on home O2 noct, HTN, hypothyroidism, OSA on CPAP, and DM, peripheral neuropathy    PT Frequency 1x / week    PT Duration 8 weeks    PT Treatment/Interventions ADLs/Self Care Home Management;Gait training;Functional mobility training;Therapeutic activities;Therapeutic exercise;Balance training;Neuromuscular re-education;Patient/family education    PT Next Visit Plan gait train with walking stick; continue turning activities and backwards stepping/walking, vestibular training for balance, lateral weigthshifting and hip strengthening, gait with head turns/nods and compliant surfaces.  Try resisted gait and side step  activities for balance recovery    Consulted and Agree with Plan of Care Patient             Patient will benefit from skilled therapeutic intervention in order to improve the following deficits and impairments:  Abnormal gait, Difficulty walking, Decreased balance, Decreased mobility, Decreased strength  Visit Diagnosis: Unsteadiness on feet  Muscle weakness (generalized)  Other abnormalities of gait and mobility     Problem List Patient Active  Problem List   Diagnosis Date Noted   Fall 07/25/2021   Confusion 07/25/2021   Unsteadiness 07/25/2021   Rib pain on left side 07/25/2021   Aortic atherosclerosis ([place]) 02/04/2021   Cognitive deficits 01/04/2021   Fatty liver 12/29/2020   Thrombocytopenia 12/20/2020   Urinary frequency 12/20/2020   Arthritis of hand 11/08/2020   Generalized anxiety disorder 06/12/2020   Recurrent falls 03/25/2020   History of SDH 03/21/2020   Rotator cuff arthropathy of left shoulder 07/21/2019   Dupuytren's disease of palm 07/12/2019   Sebaceous cyst 06/27/2019   History of COVID-19 05/08/2019   Obstructive sleep apnea    Asymmetrical left sensorineural hearing loss 07/12/2018   Chronic anticoagulation 12/21/2017   Degeneration of thoracic intervertebral disc 09/21/2017   Thoracic back pain 08/10/2017   Degenerative joint disease of hand 07/14/2017   Osteoarthritis    Fibromyalgia    Fatty tumor    BPH (benign prostatic hyperplasia)    Chronic atrial fibrillation    Insomnia 03/10/2017   Chronic pain, legs and back 03/10/2017   Hyperlipidemia 03/10/2017   Obesity (BMI 30.0-34.9) 12/08/2016   Depression 09/07/2016   Spondylolisthesis 09/07/2016   Hypothyroidism 03/08/2016   GERD (gastroesophageal reflux disease) 03/08/2016   Gout 03/08/2016   Glaucoma 03/08/2016   Type 2 diabetes mellitus 03/08/2016   Hereditary and idiopathic peripheral neuropathy 08/28/2015   History of TIA 08/28/2015   Essential hypertension 12/04/2011   Allergic rhinitis, seasonal 12/04/2011   COPD (chronic obstructive pulmonary disease)  07/18/2011    Janene Harvey, PT, DPT 08/04/21 5:31 PM   [facility] [address], [place], [name], [address] Phone: [phone]   Fax:  [phone]  Name: Momen Ham MRN: [id number] Date of Birth: 1933/01/22

## 2021-08-05 ENCOUNTER — Encounter (HOSPITAL_COMMUNITY): Payer: PPO

## 2021-08-05 LAB — VITAMIN B1: Vitamin B1 (Thiamine): 33 nmol/L — ABNORMAL HIGH (ref 8–30)

## 2021-08-06 ENCOUNTER — Telehealth: Payer: Self-pay | Admitting: *Deleted

## 2021-08-06 ENCOUNTER — Telehealth (HOSPITAL_COMMUNITY): Payer: Self-pay | Admitting: *Deleted

## 2021-08-06 NOTE — Telephone Encounter (Signed)
Close encounter 

## 2021-08-06 NOTE — Telephone Encounter (Signed)
LMVM- We received notice you needed assistance applying your ZIO XT monitor.  Please call Chava Dulac in Monitors at [phone] to set a time to come to our [facility] location to have your monitor applied.

## 2021-08-07 ENCOUNTER — Other Ambulatory Visit: Payer: Self-pay

## 2021-08-07 ENCOUNTER — Ambulatory Visit (INDEPENDENT_AMBULATORY_CARE_PROVIDER_SITE_OTHER)
Admission: RE | Admit: 2021-08-07 | Discharge: 2021-08-07 | Disposition: A | Discharge status: Home / Self Care | Payer: PPO | Source: Ambulatory Visit | Attending: Internal Medicine | Admitting: Internal Medicine

## 2021-08-07 ENCOUNTER — Ambulatory Visit (INDEPENDENT_AMBULATORY_CARE_PROVIDER_SITE_OTHER): Payer: PPO | Admitting: Internal Medicine

## 2021-08-07 ENCOUNTER — Encounter: Payer: Self-pay | Admitting: Internal Medicine

## 2021-08-07 DIAGNOSIS — R55 Syncope and collapse: Secondary | ICD-10-CM | POA: Diagnosis not present

## 2021-08-07 DIAGNOSIS — R197 Diarrhea, unspecified: Secondary | ICD-10-CM

## 2021-08-07 DIAGNOSIS — R159 Full incontinence of feces: Secondary | ICD-10-CM

## 2021-08-07 DIAGNOSIS — K6389 Other specified diseases of intestine: Secondary | ICD-10-CM | POA: Diagnosis not present

## 2021-08-07 DIAGNOSIS — Q438 Other specified congenital malformations of intestine: Secondary | ICD-10-CM | POA: Diagnosis not present
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
## 2021-08-07 NOTE — Patient Instructions (Addendum)
If you are age 86 or older, your body mass index should be between 23-30. Your Body mass index is 28.08 kg/m. If this is out of the aforementioned range listed, please consider follow up with your Primary Care Provider. ________________________________________________________  The [facility] providers would like to encourage you to use [facility] to communicate with providers for non-urgent requests or questions.  Due to long hold times on the telephone, sending your provider a message by [facility] may be a faster and more efficient way to get a response.  Please allow 48 business hours for a response.  Please remember that this is for non-urgent requests.  _______________________________________________________  Dennis Bast will go downstairs for Xray.  Press "B" on the elevator and look for Xray Room.   You are scheduled to follow up on 10-06-21 at 10:50 am.  Low-FODMAP Eating Plan FODMAP stands for fermentable oligosaccharides, disaccharides, monosaccharides, and polyols. These are sugars that are hard for some people to digest. A low-FODMAP eating plan may help some people who have irritable bowel syndrome (IBS) and certain other bowel (intestinal) diseases to manage their symptoms. This meal plan can be complicated to follow. Work with a diet and nutrition specialist (dietitian) to make a low-FODMAP eating plan that is right for you. A dietitian can help make sure that you get enough nutrition from this diet. What are tips for following this plan? Reading food labels Check labels for hidden FODMAPs such as: High-fructose syrup. Honey. Agave. Natural fruit flavors. Onion or garlic powder. Choose low-FODMAP foods that contain 3-4 grams of fiber per serving. Check food labels for serving sizes. Eat only one serving at a time to make sure FODMAP levels stay low. Shopping Shop with a list of foods that are recommended on this diet and make a meal plan. Meal planning Follow a low-FODMAP eating plan for  up to 6 weeks, or as told by your health care provider or dietitian. To follow the eating plan: Eliminate high-FODMAP foods from your diet completely. Choose only low-FODMAP foods to eat. You will do this for 2-6 weeks. Gradually reintroduce high-FODMAP foods into your diet one at a time. Most people should wait a few days before introducing the next new high-FODMAP food into their meal plan. Your dietitian can recommend how quickly you may reintroduce foods. Keep a daily record of what and how much you eat and drink. Make note of any symptoms that you have after eating. Review your daily record with a dietitian regularly to identify which foods you can eat and which foods you should avoid. General tips Drink enough fluid each day to keep your urine pale yellow. Avoid processed foods. These often have added sugar and may be high in FODMAPs. Avoid most dairy products, whole grains, and sweeteners. Work with a dietitian to make sure you get enough fiber in your diet. Avoid high FODMAP foods at meals to manage symptoms. Recommended foods Fruits Bananas, oranges, tangerines, lemons, limes, blueberries, raspberries, strawberries, grapes, cantaloupe, honeydew melon, kiwi, papaya, passion fruit, and pineapple. Limited amounts of dried cranberries, banana chips, and shredded coconut. Vegetables Eggplant, zucchini, cucumber, peppers, green beans, bean sprouts, lettuce, arugula, kale, Swiss chard, spinach, collard greens, bok choy, summer squash, potato, and tomato. Limited amounts of corn, carrot, and sweet potato. Green parts of scallions. Grains Gluten-free grains, such as rice, oats, buckwheat, quinoa, corn, polenta, and millet. Gluten-free pasta, bread, or cereal. Rice noodles. Corn tortillas. Meats and other proteins Unseasoned beef, pork, poultry, or fish. Eggs. Berniece Salines. Tofu (firm)  and tempeh. Limited amounts of nuts and seeds, such as almonds, walnuts, [place] nuts, pecans, peanuts, nut butters,  pumpkin seeds, chia seeds, and sunflower seeds. Dairy Lactose-free milk, yogurt, and kefir. Lactose-free cottage cheese and ice cream. Non-dairy milks, such as almond, coconut, hemp, and rice milk. Non-dairy yogurt. Limited amounts of goat cheese, brie, mozzarella, parmesan, swiss, and other hard cheeses. Fats and oils Butter-free spreads. Vegetable oils, such as olive, canola, and sunflower oil. Seasoning and other foods Artificial sweeteners with names that do not end in "ol," such as aspartame, saccharine, and stevia. Maple syrup, white table sugar, raw sugar, brown sugar, and molasses. Mayonnaise, soy sauce, and tamari. Fresh basil, coriander, parsley, rosemary, and thyme. Beverages Water and mineral water. Sugar-sweetened soft drinks. Small amounts of orange juice or cranberry juice. Black and green tea. Most dry wines. Coffee. The items listed above may not be a complete list of foods and beverages you can eat. Contact a dietitian for more information. Foods to avoid Fruits Fresh, dried, and juiced forms of apple, pear, watermelon, peach, plum, cherries, apricots, blackberries, boysenberries, figs, nectarines, and mango. Avocado. Vegetables Chicory root, artichoke, asparagus, cabbage, snow peas, Brussels sprouts, broccoli, sugar snap peas, mushrooms, celery, and cauliflower. Onions, garlic, leeks, and the white part of scallions. Grains Wheat, including kamut, durum, and semolina. Barley and bulgur. Couscous. Wheat-based cereals. Wheat noodles, bread, crackers, and pastries. Meats and other proteins Fried or fatty meat. Sausage. Cashews and pistachios. Soybeans, baked beans, black beans, chickpeas, kidney beans, fava beans, navy beans, lentils, black-eyed peas, and split peas. Dairy Milk, yogurt, ice cream, and soft cheese. Cream and sour cream. Milk-based sauces. Custard. Buttermilk. Soy milk. Seasoning and other foods Any sugar-free gum or candy. Foods that contain artificial sweeteners  such as sorbitol, mannitol, isomalt, or xylitol. Foods that contain honey, high-fructose corn syrup, or agave. Bouillon, vegetable stock, beef stock, and chicken stock. Garlic and onion powder. Condiments made with onion, such as hummus, chutney, pickles, relish, salad dressing, and salsa. Tomato paste. Beverages Chicory-based drinks. Coffee substitutes. Chamomile tea. Fennel tea. Sweet or fortified wines such as port or sherry. Diet soft drinks made with isomalt, mannitol, maltitol, sorbitol, or xylitol. Apple, pear, and mango juice. Juices with high-fructose corn syrup. The items listed above may not be a complete list of foods and beverages you should avoid. Contact a dietitian for more information. Summary FODMAP stands for fermentable oligosaccharides, disaccharides, monosaccharides, and polyols. These are sugars that are hard for some people to digest. A low-FODMAP eating plan is a short-term diet that helps to ease symptoms of certain bowel diseases. The eating plan usually lasts up to 6 weeks. After that, high-FODMAP foods are reintroduced gradually and one at a time. This can help you find out which foods may be causing symptoms. A low-FODMAP eating plan can be complicated. It is best to work with a dietitian who has experience with this type of plan. This information is not intended to replace advice given to you by your health care provider. Make sure you discuss any questions you have with your health care provider. Document Revised: 10/19/2019 Document Reviewed: 10/19/2019 Elsevier Patient Education  2022 [place] you for entrusting me with your care and choosing [facility].  Dr Lorenso Courier

## 2021-08-08 ENCOUNTER — Ambulatory Visit (HOSPITAL_COMMUNITY)
Admission: RE | Admit: 2021-08-08 | Discharge: 2021-08-08 | Disposition: A | Discharge status: Home / Self Care | Payer: PPO | Source: Ambulatory Visit | Attending: Cardiology | Admitting: Cardiology

## 2021-08-08 DIAGNOSIS — Z7901 Long term (current) use of anticoagulants: Secondary | ICD-10-CM | POA: Insufficient documentation

## 2021-08-08 DIAGNOSIS — I10 Essential (primary) hypertension: Secondary | ICD-10-CM | POA: Insufficient documentation

## 2021-08-08 DIAGNOSIS — R079 Chest pain, unspecified: Secondary | ICD-10-CM | POA: Diagnosis not present

## 2021-08-08 DIAGNOSIS — I480 Paroxysmal atrial fibrillation: Secondary | ICD-10-CM | POA: Insufficient documentation

## 2021-08-08 LAB — MYOCARDIAL PERFUSION IMAGING
LV dias vol: 134 mL (ref 62–150)
LV sys vol: 61 mL
Nuc Stress EF: 54 %
Peak HR: 60 {beats}/min
Rest HR: 48 {beats}/min
Rest Nuclear Isotope Dose: 10.9 mCi
SDS: 0
SRS: 4
SSS: 4
ST Depression (mm): 0 mm
Stress Nuclear Isotope Dose: 30.7 mCi
TID: 1.17

## 2021-08-08 MED ORDER — TECHNETIUM TC 99M TETROFOSMIN IV KIT
30.7000 | PACK | Freq: Once | INTRAVENOUS | Status: AC | PRN
  Administered 2021-08-08: 30.7 via INTRAVENOUS
  Filled 2021-08-08: qty 31

## 2021-08-08 MED ORDER — TECHNETIUM TC 99M TETROFOSMIN IV KIT
10.9000 | PACK | Freq: Once | INTRAVENOUS | Status: AC | PRN
  Administered 2021-08-08: 10.9 via INTRAVENOUS
  Filled 2021-08-08: qty 11

## 2021-08-08 MED ORDER — REGADENOSON 0.4 MG/5ML IV SOLN
0.4000 mg | Freq: Once | INTRAVENOUS | Status: AC
  Administered 2021-08-08: 0.4 mg via INTRAVENOUS

## 2021-08-11 ENCOUNTER — Ambulatory Visit: Payer: PPO | Admitting: Physical Therapy

## 2021-08-11 ENCOUNTER — Other Ambulatory Visit: Payer: Self-pay

## 2021-08-11 ENCOUNTER — Encounter: Payer: Self-pay | Admitting: Physical Therapy

## 2021-08-11 ENCOUNTER — Encounter: Payer: Self-pay | Admitting: Internal Medicine

## 2021-08-11 DIAGNOSIS — R2689 Other abnormalities of gait and mobility: Secondary | ICD-10-CM

## 2021-08-11 DIAGNOSIS — M6281 Muscle weakness (generalized): Secondary | ICD-10-CM

## 2021-08-11 DIAGNOSIS — R41841 Cognitive communication deficit: Secondary | ICD-10-CM | POA: Diagnosis not present

## 2021-08-11 DIAGNOSIS — R2681 Unsteadiness on feet: Secondary | ICD-10-CM

## 2021-08-11 NOTE — Therapy (Signed)
[facility] [address], [place], [name], [address] Phone: [phone]   Fax:  [phone]  Physical Therapy Treatment  Patient Details  Name: Jonathan [name] MRN: [id number] Date of Birth: Oct 03, 1932 Referring Provider (PT): Lazaro Arms, NP   Encounter Date: 08/11/2021   PT End of Session - 08/11/21 0914     Visit Number 17    Number of Visits 20    Date for PT Re-Evaluation 09/02/21    Authorization Type [employer]    Progress Note Due on Visit 19   *Progress note completed on 9th visit   PT Start Time 0845    PT Stop Time 0927    PT Time Calculation (min) 42 min    Equipment Utilized During Treatment Gait belt    Activity Tolerance Patient tolerated treatment well    Behavior During Therapy [facility] for tasks assessed/performed             Past Medical History:  Diagnosis Date   Acute encephalopathy 05/08/2019   Allergic rhinitis, seasonal 12/04/2011   Arthritis of hand 11/08/2020   BPH (benign prostatic hyperplasia)    Bronchitis    Cataract    Chronic anticoagulation 12/21/2017   CHADS VASC=4, Eliquis stopped Sept 2021- recurrent falls with SDH   Chronic atrial fibrillation    On Eliquis   Chronic pain, legs and back 03/10/2017   Preferred pain management Leg and back pain   Compression fracture of thoracic vertebra 09/21/2017   COPD (chronic obstructive pulmonary disease)    2 liters O2 HS   Degeneration of thoracic intervertebral disc 09/21/2017   Degenerative joint disease of hand 07/14/2017   Dupuytren's disease of palm 07/12/2019   Essential hypertension 12/04/2011   Fatty tumor    waste and back   Fibromyalgia    Generalized anxiety disorder 06/12/2020   12/21 He is reporting severe insomnia and anxiety symptoms.  Options are limited.  We will try a low-dose lorazepam at night and during the day for anxiety and panic attacks.  He will stop lorazepam if problems.  He will stop drinking  alcohol.  Discontinue BuSpar.  Reduce Cymbalta to 1 a day.   GERD (gastroesophageal reflux disease)    Glaucoma 03/08/2016   Gout 03/08/2016   Hereditary and idiopathic peripheral neuropathy 08/28/2015   History of COVID-19 05/08/2019   2021 Post-COVID sx's -" brain fog" Try Lion's mane supplement and B complex with niacin for neuropathy   Hyperlipidemia 03/10/2017   Hypothyroidism    Hypoxia    Insomnia    Leukocytosis 03/14/2020   Major depressive disorder 09/07/2016   Obstructive sleep apnea    Osteoarthritis    Osteoporosis    Recurrent falls 03/25/2020   PT. Treat neuropathy, insomnia.  Reduce Cymbalta to 1 a day.  Discontinue BuSpar.   Rotator cuff arthropathy of left shoulder 07/21/2019   SDH (subdural hematoma) 03/21/2020   Recurrent SDH secondary to falls at home- Sept 2019 and again 03/14/2020- Eliquis stopped.   Sebaceous cyst 06/27/2019   Spinal compression fracture seventh vertebre   Spondylolisthesis 09/07/2016   On fosamax - for about one year - Dr Alyson Ingles  10/14/17 dexa: normal dexa -- spine 2.1,   RFN -0.9,  LFN   -0.7   - no comparison on file, previous fracture      Thoracic back pain 08/10/2017   Thrombocytopenia 12/20/2020   TIA (transient ischemic attack) 08/28/2015   Type 2 diabetes mellitus 03/08/2016  Past Surgical History:  Procedure Laterality Date   APPENDECTOMY  age 3   CARPAL TUNNEL RELEASE Right    early 2000s   CATARACT EXTRACTION     bilateral   CHOLECYSTECTOMY  age 47   EYE SURGERY     FOOT ARTHRODESIS Right 02/02/2013   Procedure: RIGHT HALLUX METATARSAL PHALANGEAL JOINT ARTHRODESIS ;  Surgeon: Wylene Simmer, MD;  Location: [place];  Service: Orthopedics;  Laterality: Right;   INGUINAL HERNIA REPAIR  age 28   rt side   NASAL CONCHA BULLOSA RESECTION  age 26   PROSTATE SURGERY     SHOULDER ARTHROSCOPY W/ ROTATOR CUFF REPAIR Right    early 2000s   tonsil     VASECTOMY  age 63    There were no vitals filed for this  visit.   Subjective Assessment - 08/11/21 0847     Subjective Denies chest pain, but having rib pain since his previous fall which is making it hard to sleep at night. Wearing a heart monitor for 14 days. Left his walking stick at a meeting, hopefully it will be there when he comes back.    Pertinent History PMH: pAF on Eliquis (to see cardiologist on 04/23/21), chronic pain on oxycodone and Ambien, previous SDH, COPD on home O2 noct, HTN, hypothyroidism, OSA on CPAP, and DM    Diagnostic tests 07/25/21 chest xray negative, head CT showed no acute intracranial abnormality    Patient Stated Goals Pt's goal for therapy is to improve balance, strengthening, and walking.    Currently in Pain? Yes    Pain Score 3     Pain Location Knee    Pain Orientation Left    Pain Descriptors / Indicators Aching    Pain Type Chronic pain                               [facility] Adult PT Treatment/Exercise - 08/11/21 0001       Ambulation/Gait   Ambulation Distance (Feet) 200 Feet    Gait Pattern Step-through pattern;Poor foot clearance - right;Poor foot clearance - left;Lateral hip instability    Ambulation Surface Level;Unlevel;Indoor;Outdoor;Paved;Grass    Stairs Yes    Stairs Assistance 7: Independent    Stair Management Technique No rails;Alternating pattern   cues for safe foot placement and controlled descending   Number of Stairs --   clinic stairs x6   Gait Comments gait training with single walking pole with cues for proper AD seqeuncing, controlling pace on hills, and avoiding path deviation      Neuro Re-ed    Neuro Re-ed Details  walking backwards with single pole 4x 73f with cues for pacing, R/L step back over hurdle 5x with walking pole, 5x without, sidestep on foam without UEs 10x, wide and romberg stance on foam passing red medball behind back CW/CCW 10x      Knee/Hip Exercises: Aerobic   Nustep Level 5, UEs/LEs  x 6 minutes for leg strength, flexibility.                      PT Education - 08/11/21 0934     Education Details encouraged use of walking stick on days when he feels less balance    Person(s) Educated Patient    Methods Explanation    Comprehension Verbalized understanding;Returned demonstration              PT Short Term  Goals - 07/22/21 1045       PT SHORT TERM GOAL #1   Title Pt will be independent with progression of HEP for improved strength, balance, transfers, and gait.  TARGET 07/11/2021    Time 3    Period Weeks    Status Partially Met   met for current   Target Date 08/12/21      PT SHORT TERM GOAL #2   Title Pt will improve 5x sit<>stand to less than or equal to 11.5 sec to demonstrate improved functional strength and transfer efficiency.    Baseline 12.38 sec with decreased forward lean to initiate; 05/13/21:  15.47 sec, 2nd trial 12.38 sec;    Time 3    Period Weeks    Status On-going   12.14 sec on 07/22/21   Target Date 08/12/21               PT Long Term Goals - 07/28/21 1555       PT LONG TERM GOAL #1   Title Pt will be independent with final HEP for improved strength, balance, transfers, and gait.  TARGET 07/25/2021    Time 6    Period Weeks    Status Partially Met   reports partial compliance; denies questions   Target Date 09/02/21      PT LONG TERM GOAL #2   Title Pt will improve FGA score to at least 24/30 to decrease fall risk.    Baseline 15/24; 21/24 06/10/2021    Time 6    Status On-going   21/30 on 02/07   Target Date 09/02/21      PT LONG TERM GOAL #3   Title Patient to complete 490mon 6 minute walk test with LRAD. (>1300 ft)    Baseline 1206 ft    Time 6    Period Weeks    Status On-going   1209 ft on 07/28/21   Target Date 09/02/21      PT LONG TERM GOAL #4   Title Pt will verbalize plans for continued community fitness upon d/c from PT to maximize gains made in therapy.    Time 6    Period Weeks    Status Partially Met   currently walking 2-3x/week but has not  started gym activities d/t low motivation   Target Date 09/02/21                   Plan - 08/11/21 0935     Clinical Impression Statement Patient arrived to session without new complaints. Notes that he is wearing a heart monitor for 2 weeks and has tried ambulating with his walking stick but left it at a meeting and has not yet retrieved it. Worked on gait training with walking pole indoors and outdoors with cues for proper AD sequencing, controlling pace on hills, and avoiding path deviation. Continued practicing backwards walking and stepping with and without walking pole. Patient with improved pacing and stability with and without AD today. Patient reported some imbalance with stairs at home, thus practiced stairs with cueing for foot placement and control when descending. Patient tolerated session well and without complaints at end of session.    Comorbidities PMH: pAF on Eliquis, chronic pain on oxycodone and Ambien, previous SDH, COPD on home O2 noct, HTN, hypothyroidism, OSA on CPAP, and DM, peripheral neuropathy    PT Frequency 1x / week    PT Duration 8 weeks    PT Treatment/Interventions ADLs/Self Care Home Management;Gait training;Functional mobility  training;Therapeutic activities;Therapeutic exercise;Balance training;Neuromuscular re-education;Patient/family education    PT Next Visit Plan gait train with walking stick; continue turning activities and backwards stepping/walking, vestibular training for balance, lateral weigthshifting and hip strengthening, gait with head turns/nods and compliant surfaces.  Try resisted gait and side step activities for balance recovery    Consulted and Agree with Plan of Care Patient             Patient will benefit from skilled therapeutic intervention in order to improve the following deficits and impairments:  Abnormal gait, Difficulty walking, Decreased balance, Decreased mobility, Decreased strength  Visit Diagnosis: Unsteadiness  on feet  Muscle weakness (generalized)  Other abnormalities of gait and mobility     Problem List Patient Active Problem List   Diagnosis Date Noted   Fall 07/25/2021   Confusion 07/25/2021   Unsteadiness 07/25/2021   Rib pain on left side 07/25/2021   Aortic atherosclerosis ([place]) 02/04/2021   Cognitive deficits 01/04/2021   Fatty liver 12/29/2020   Thrombocytopenia 12/20/2020   Urinary frequency 12/20/2020   Arthritis of hand 11/08/2020   Generalized anxiety disorder 06/12/2020   Recurrent falls 03/25/2020   History of SDH 03/21/2020   Rotator cuff arthropathy of left shoulder 07/21/2019   Dupuytren's disease of palm 07/12/2019   Sebaceous cyst 06/27/2019   History of COVID-19 05/08/2019   Obstructive sleep apnea    Asymmetrical left sensorineural hearing loss 07/12/2018   Chronic anticoagulation 12/21/2017   Degeneration of thoracic intervertebral disc 09/21/2017   Thoracic back pain 08/10/2017   Degenerative joint disease of hand 07/14/2017   Osteoarthritis    Fibromyalgia    Fatty tumor    BPH (benign prostatic hyperplasia)    Chronic atrial fibrillation    Insomnia 03/10/2017   Chronic pain, legs and back 03/10/2017   Hyperlipidemia 03/10/2017   Obesity (BMI 30.0-34.9) 12/08/2016   Depression 09/07/2016   Spondylolisthesis 09/07/2016   Hypothyroidism 03/08/2016   GERD (gastroesophageal reflux disease) 03/08/2016   Gout 03/08/2016   Glaucoma 03/08/2016   Type 2 diabetes mellitus 03/08/2016   Hereditary and idiopathic peripheral neuropathy 08/28/2015   History of TIA 08/28/2015   Essential hypertension 12/04/2011   Allergic rhinitis, seasonal 12/04/2011   COPD (chronic obstructive pulmonary disease)  07/18/2011    Janene Harvey, PT, DPT 08/11/21 9:41 AM   [facility] [address] [place], [name], [address] Phone: [phone]   Fax:  [phone]  Name: Resean Brander MRN: [id number] Date of  Birth: June 25, 1932

## 2021-08-11 NOTE — Progress Notes (Signed)
Cardiology Office Note:    Date:  08/25/2021   ID:  Jonathan [name], DOB January 16, 1933, MRN [id number]  PCP:  Jonathan Rail, MD  Cardiologist:  Kirk Ruths, MD  Electrophysiologist:  None   Referring MD: Jonathan Rail, MD   Chief Complaint: follow-up of chest pain, syncope, and atrial fibrillation  History of Present Illness:    Jonathan [name] is a 88 y.o. male with a history of persistent atrial fibrillation on Eliquis, mild to moderate mitral regurgitation, COPD, hypertension, hyperlipidemia,type 2 diabetes mellitus, hypothyroidism, obstructive sleep apnea, prior TIA, fibromyalgia, and GERD who is followed by Dr. Stanford [name] and presents today for follow-up of chest pain, syncope, and atrial fibrillation.   Patient is followed primarily for atrial fibrillation. Prior Myoview in 2016 was low risk with no evidence of ischemia. Patient has worn multiple monitors over the last few years (most recently in 05/2020) which have showed PACs, PVCs, brief episodes of NSVT, PAT, and paroxysmal atrial fibrillation. Anticoagulation was temporarily stopped due to recurrent SDH in 02/2018 and again in 02/2020 following falls. However, this was ultimately restarted given prior TIAs and he has tolerated this well.   Patient was last seen by Dr. Stanford [name] on 04/23/2021 at which time he was noted to be in rate controlled atrial fibrillation but was asymptomatic with this. Long discussion was had with patient regarding rate control vs rhythm control. Given patient was asymptomatic, decision was made to focus on rate control with plans for antiarrhythmic followed by cardioversion if he becomes symptomatic. Echo was ordered and showed LVEF of 60-65% with normal wall motion and normal diastolic parameters, normal RV, and mild to moderate MR. At follow-up visit with me in 06/2021, patient felt like he was much more fatigue since prior visit and firmed believed it was due to his atrial fibrillation. He wanted to pursue  cardioversion. He was started on Amiodarone and then DCCV was scheduled but when he presented for this he was back in normal sinus rhythm.   Patient sent [place] a MyChart message on 07/29/2021 with reports of a very brief syncopal episode that occurred after falling and hitting his face while walking up hill. He saw his PCP who ordered a chest x-ray and head CT which were normal and referred him to Neurology. Dr. Stanford [name] also recommended decreasing his Cardizem and ordered a 2 week Zio Monitor.   Patient was added onto Dr. Doug Sou schedule on 07/31/2021 for further evaluation of sudden onset of chest pain that woke him up at 3am and improved after taking 3 "very old" sublingual Nitro. He then had recurrent pain later that day. Patient did report that he fell a couple of weeks ago and hurt his ribs. Myoview was ordered and showed no evidence of ischemia.   Patient presents today for follow-up.  Here alone.  Patient doing well since last visit.  He denies any recurrent chest pain.  He has some dyspnea with activity but this is stable.  No shortness of breath at rest.  No orthopnea, PND, lower extremity edema.  No palpitations, lightheadedness, dizziness.  No recurrent syncope.  We did discuss his recent syncopal episode.  He states he was walking briskly up a hill and noticed that he was leaning forward.  He straightened up so that he did not fall forward and when he did this he states he passed out for a "millisecond" but regained consciousness before even hitting the ground. He denies any palpitations, lightheadedness, dizziness beforehand. He states this was an isolated  event. He continues to describe fatigue and some memory fog especially towards the end of the day.   Past Medical History:  Diagnosis Date   Acute encephalopathy 05/08/2019   Allergic rhinitis, seasonal 12/04/2011   Arthritis of hand 11/08/2020   BPH (benign prostatic hyperplasia)    Cataract    Chronic anticoagulation 12/21/2017   CHADS  VASC=4, Eliquis stopped Sept 2021- recurrent falls with SDH   Chronic pain, legs and back 03/10/2017   Preferred pain management Leg and back pain   Compression fracture of thoracic vertebra 09/21/2017   COPD (chronic obstructive pulmonary disease)    2 liters O2 HS   Degeneration of thoracic intervertebral disc 09/21/2017   Degenerative joint disease of hand 07/14/2017   Dupuytren's disease of palm 07/12/2019   Essential hypertension 12/04/2011   Fatty tumor    waste and back   Fibromyalgia    Generalized anxiety disorder 06/12/2020   12/21 He is reporting severe insomnia and anxiety symptoms.  Options are limited.  We will try a low-dose lorazepam at night and during the day for anxiety and panic attacks.  He will stop lorazepam if problems.  He will stop drinking alcohol.  Discontinue BuSpar.  Reduce Cymbalta to 1 a day.   GERD (gastroesophageal reflux disease)    Glaucoma 03/08/2016   Gout 03/08/2016   Hereditary and idiopathic peripheral neuropathy 08/28/2015   History of COVID-19 05/08/2019   2021 Post-COVID sx's -" brain fog" Try Lion's mane supplement and B complex with niacin for neuropathy   Hyperlipidemia 03/10/2017   Hypothyroidism    Hypoxia    Insomnia    Major depressive disorder 09/07/2016   Obstructive sleep apnea    Osteoarthritis    Osteoporosis    Persistent atrial fibrillation ([place])    on Eliquis   Recurrent falls 03/25/2020   PT. Treat neuropathy, insomnia.  Reduce Cymbalta to 1 a day.  Discontinue BuSpar.   Rotator cuff arthropathy of left shoulder 07/21/2019   SDH (subdural hematoma) 03/21/2020   Recurrent SDH secondary to falls at home- Sept 2019 and again 03/14/2020- Eliquis stopped.   Sebaceous cyst 06/27/2019   Spinal compression fracture seventh vertebre   Spondylolisthesis 09/07/2016   On fosamax - for about one year - Dr Alyson Ingles  10/14/17 dexa: normal dexa -- spine 2.1,   RFN -0.9,  LFN   -0.7   - no comparison on file, previous fracture       Thoracic back pain 08/10/2017   Thrombocytopenia 12/20/2020   TIA (transient ischemic attack) 08/28/2015   Type 2 diabetes mellitus 03/08/2016    Past Surgical History:  Procedure Laterality Date   APPENDECTOMY  age 39   [place] Right    early 2000s   CATARACT EXTRACTION     bilateral   CHOLECYSTECTOMY  age 58   EYE SURGERY     FOOT ARTHRODESIS Right 02/02/2013   Procedure: RIGHT HALLUX METATARSAL PHALANGEAL JOINT ARTHRODESIS ;  Surgeon: Wylene Simmer, MD;  Location: [facility];  Service: Orthopedics;  Laterality: Right;   INGUINAL HERNIA REPAIR  age 25   rt side   NASAL CONCHA BULLOSA RESECTION  age 70   PROSTATE SURGERY     SHOULDER ARTHROSCOPY W/ ROTATOR CUFF REPAIR Right    early 2000s   tonsil     VASECTOMY  age 64    Current Medications: Current Meds  Medication Sig   acetaminophen (TYLENOL) 650 MG CR tablet Take 1,300 mg by mouth in  the morning and at bedtime.   acyclovir ointment (ZOVIRAX) 5 % Apply 1 application topically as needed.   allopurinol (ZYLOPRIM) 300 MG tablet Take 1 tablet (300 mg total) by mouth every evening. For gout   amiodarone (PACERONE) 200 MG tablet Take 1 tablet (200 mg total) by mouth daily. Take 1 tablet (200 mg) by mouth 1 time daily   atorvastatin (LIPITOR) 20 MG tablet Take 1 tablet (20 mg total) by mouth daily. For cholesterol   Calcium Citrate-Vitamin D (CALCIUM + D PO) Take 1 tablet by mouth daily.   carboxymethylcellulose (REFRESH PLUS) 0.5 % SOLN Place 1 drop into both eyes 3 (three) times daily as needed (dry eyes).   clobetasol (TEMOVATE) 0.05 % external solution Apply 1 application topically 2 (two) times daily as needed (rash).   Continuous Blood Gluc Receiver (FREESTYLE LIBRE 14 DAY READER) DEVI UAD to check sugars.  E11.65   Continuous Blood Gluc Sensor (FREESTYLE LIBRE 14 DAY SENSOR) MISC UAD to check sugars, E11.65   dapagliflozin propanediol (FARXIGA) 10 MG TABS tablet Take 1 tablet (10 mg total) by  mouth daily before breakfast. For diabetes   diltiazem (CARDIZEM CD) 240 MG 24 hr capsule Take 1 capsule (240 mg total) by mouth daily.   diltiazem (CARDIZEM) 30 MG tablet Take 1 tablet (30 mg total) by mouth 2 (two) times daily as needed (sustained elevated heart rates >100 bpm for > 20 minutes.).   ELIQUIS 5 MG TABS tablet TAKE ONE TABLET BY MOUTH TWICE A DAY   Emollient (RA RENEWAL DARK SPOT CORRECTOR EX) Apply 1 application topically daily as needed (dark spots).   FLUoxetine (PROZAC) 40 MG capsule Take 1 capsule (40 mg total) by mouth daily. For depression   Fluticasone Propionate (ALLERGY RELIEF NA) Place 2 sprays into the nose daily as needed (allergies).   glucose blood test strip Use to check blood sugar daily. E11.9   hydrochlorothiazide (HYDRODIURIL) 12.5 MG tablet Take 1 tablet (12.5 mg total) by mouth daily.   Lancets MISC Use to check blood sugars daily. E11.9   latanoprost (XALATAN) 0.005 % ophthalmic solution Place 1 drop into both eyes at bedtime.   levothyroxine (SYNTHROID) 112 MCG tablet TAKE ONE TABLET BY MOUTH DAILY BEFORE BREAKFAST   Melatonin 5 MG TABS Take 5 mg by mouth at bedtime.   mometasone (NASONEX) 50 MCG/ACT nasal spray Place 2 sprays into the nose as needed (allergies).   Multiple Vitamin (MULTIVITAMIN) tablet Take 1 tablet by mouth daily.   nitroGLYCERIN (NITROSTAT) 0.4 MG SL tablet Place 1 tablet (0.4 mg total) under the tongue every 5 (five) minutes as needed for chest pain.   omeprazole (PRILOSEC) 20 MG capsule TAKE ONE CAPSULE BY MOUTH DAILY   oxyCODONE-acetaminophen (PERCOCET) 10-325 MG tablet Take 1 tablet by mouth every 8 (eight) hours as needed for pain.   pregabalin (LYRICA) 75 MG capsule Take 1 capsule (75 mg total) by mouth 2 (two) times daily.   Probiotic Product (PROBIOTIC PO) Take 1 capsule by mouth daily.   RESTASIS 0.05 % ophthalmic emulsion Place 1 drop into both eyes 2 (two) times daily.    Semaglutide (RYBELSUS) 7 MG TABS Take 7 mg by mouth  daily. Via [employer] pt assistance   sildenafil (REVATIO) 20 MG tablet Take 40-100 mg by mouth daily as needed.   silodosin (RAPAFLO) 8 MG CAPS capsule Take 8 mg by mouth daily.   telmisartan (MICARDIS) 40 MG tablet Take 1 tablet (40 mg total) by mouth daily.  Vibegron (GEMTESA) 75 MG TABS Take 75 mg by mouth daily.   vitamin C (ASCORBIC ACID) 500 MG tablet Take 500 mg by mouth daily.   VITAMIN D, CHOLECALCIFEROL, PO Take 1 capsule by mouth daily.   [DISCONTINUED] telmisartan (MICARDIS) 20 MG tablet Take 1 tablet (20 mg total) by mouth daily.     Allergies:   Other   Social History   Socioeconomic History   Marital status: Married    Spouse name: Not on file   Number of children: 2   Years of education: 48   Highest education level: Master's degree (e.g., MA, MS, MEng, MEd, MSW, MBA)  Occupational History   Occupation: RETIRED    Employer: RETIRED  Tobacco Use   Smoking status: Former    Packs/day: 2.00    Years: 10.00    Pack years: 20.00    Types: Cigarettes    Quit date: 07/18/1975    Years since quitting: 46.1   Smokeless tobacco: Never  Vaping Use   Vaping Use: Never used  Substance and Sexual Activity   Alcohol use: Yes    Comment: 1-2 drinks/day   Drug use: No   Sexual activity: Not Currently  Other Topics Concern   Not on file  Social History Narrative   Right handed   Master degree   Social Determinants of Health   Financial Resource Strain: Not on file  Food Insecurity: Not on file  Transportation Needs: Not on file  Physical Activity: Not on file  Stress: Not on file  Social Connections: Not on file     Family History: The patient's family history includes Arthritis in his mother; Coronary artery disease in his brother; Dementia in his mother; Heart disease in his father; Kidney cancer in his father; Lung cancer in his father and mother; Prostate cancer in his father.  ROS:   Please see the history of present illness.     EKGs/Labs/Other Studies  Reviewed:    The following studies were reviewed today:   Event Monitor 02/08/2017 to 03/09/2017: Sinus with PACs, PVCs, and PAF (intermittent RVR). _______________   Elwyn Reach Monitor 12/2017: Sinus with PACs, PVCs, rare couplet and 3 beats NSVT. _______________   Elwyn Reach Monitor 05/2020: Sinus rhythm with PVCs, PACs, PAF, PAT and 4 beats NSVT. _______________   Echocardiogram 05/07/2021: Impressions:  1. Left ventricular ejection fraction, by estimation, is 60 to 65%. The  left ventricle has normal function. The left ventricle has no regional  wall motion abnormalities. Left ventricular diastolic parameters were  normal.   2. Right ventricular systolic function is normal. The right ventricular  size is normal.   3. The mitral valve is normal in structure. Mild to moderate mitral valve  regurgitation.   4. The aortic valve is normal in structure. Aortic valve regurgitation is  not visualized. _______________  Myoview 08/08/2021:   Left ventricular function is normal. Nuclear stress EF: 54 %. The left ventricular ejection fraction is mildly decreased (45-54%). End diastolic cavity size is normal.   There is mildly reduced uptake along the inferior wall region seen at both rest and stress likely secondary to diaphragmatic attenuation artifact.  Otherwise no significant ischemia identified.  Overall low risk study.   The study is normal. The study is low risk.   No ST deviation was noted.  EKG:  EKG not ordered today.   Recent Labs: 07/15/2021: Hemoglobin 13.8; Platelet Count 97 07/30/2021: ALT 37; BUN 17; Creatinine, Ser 1.02; Potassium 4.1; Sodium 148; TSH 4.200  Recent Lipid Panel    Component Value Date/Time   CHOL 100 07/09/2021 0910   TRIG 126.0 07/09/2021 0910   HDL 39.20 07/09/2021 0910   CHOLHDL 3 07/09/2021 0910   VLDL 25.2 07/09/2021 0910   LDLCALC 36 07/09/2021 0910   LDLCALC 65 11/08/2020 1624   LDLDIRECT 51.0 08/14/2019 1028    Physical Exam:    Vital Signs: BP  138/72    Pulse (!) 59    Ht 5\' 6"  (1.676 m)    Wt 173 lb (78.5 kg)    SpO2 96%    BMI 27.92 kg/m     Wt Readings from Last 3 Encounters:  08/25/21 173 lb (78.5 kg)  08/08/21 173 lb (78.5 kg)  08/07/21 174 lb (78.9 kg)     General: 88 y.o. Caucasian male in no acute distress. HEENT: Normocephalic and atraumatic. Sclera clear.  Neck: Supple. No carotid bruits.  Heart: Borderline bradycardic with normal rhythm. Distinct S1 and S2. No murmurs, gallops, or rubs. Radial  pulses 2+ and equal bilaterally. Lungs: No increased work of breathing. Clear to ausculation bilaterally. No wheezes, rhonchi, or rales.  Abdomen: Soft, non-distended, and non-tender to palpation.  Extremities: No lower extremity edema.    Skin: Warm and dry. Neuro: Alert and oriented x3. No focal deficits. Psych: Normal affect. Responds appropriately.  Assessment:    1. History of chest pain   2. Persistent atrial fibrillation ([place])   3. Syncope and collapse   4. Mitral valve insufficiency, unspecified etiology   5. Primary hypertension   6. Hyperlipidemia, unspecified hyperlipidemia type   7. Type 2 diabetes mellitus without complication, without long-term current use of insulin ([place])   8. Obstructive sleep apnea   9. Chronic fatigue     Plan:    History of Chest Pain Patient reported chest pain at last visit. Myoview was ordered and was low risk with no evidence of ischemia. - No recurrence. - No additional work-up necessary at this time.   Syncope Recent syncopal episode in HPI. I am wondering if he truly had LOC. This was an isolated event.  - No recurrent syncope.  - He finished wearing 2 week Zio monitor yesterday. Will wait for results. - Echo in 04/2021 showed normal LV function. Do not think we need to repeat Echo at this time. If he has recurrence, will repeat.  Persistent Atrial Fibrillation History of paroxysmal atrial fibrillation. Noted on multiple monitors in the past. He was started on  Amiodarone in 06/2021 and converted to sinus rhythm.  - Maintaining sinus rhythm on exam.  - Continue Cardizem CD 240mg  daily. Also has short acting Cardizem 30mg  to take as needed for palpitations/tachycardia. - CHA2DS2-VASc = 6 (HTN DM, TIA x2, age x2). Continue Eliquis 5mg  twice daily. Of note, patient does have a history of SDH due to falls but is tolerating anticoagulation well. He had a recent fall last month and fell on his face but head CT was normal.   Mitral Regurgitation Mild to moderate MR noted on recent Echo in 04/2021. - Will need to continue serial outpatient monitoring.   Hypertension BP elevated in the office. Initially 138/72 and then 162/68 when I personally rechecked this at end of visit. Systolic BP usually in the 130s to 150s at home. - Current medications: HCTZ 12.5mg  daily, Telmisartan 20mg  daily, Cardizem CD 240mg  daily. Will increase Telmisartan to 40mg  daily. - Will repeat BMET in 1 week.    Hyperlipidemia Lipid panel from 10/2020: Total Cholesterol 128,  Triglycerides 245, HDL 42, LDL 65. - Continue Lipitor 20mg  daily.  - Labs followed by PCP.   Type 2 Diabetes Mellitus Hemoglobin A1c 6.6 in 02/2021. - On Metformin, Semaglutide, and Farxiga. - Managed by PCP.   Obstructive Sleep Apnea Compliant with CPAP. Continue.   Chronic Fatigue Patient continues to report fatigue as well as some memory fog especially towards the end of the day. Recent Myoview showed no ischemia. Recent TSH and hemoglobin normal.  - Recommended discussing with PCP.    Disposition: Follow up in 6 months.   Medication Adjustments/Labs and Tests Ordered: Current medicines are reviewed at length with the patient today.  Concerns regarding medicines are outlined above.  Orders Placed This Encounter  Procedures   Basic metabolic panel   Meds ordered this encounter  Medications   telmisartan (MICARDIS) 40 MG tablet    Sig: Take 1 tablet (40 mg total) by mouth daily.    Dispense:  90  tablet    Refill:  3    Patient Instructions  Medication Instructions:  Increase Telmisartan to 40 mg ( Take 1 Tablet Daily). *If you need a refill on your cardiac medications before your next appointment, please call your pharmacy*   Lab Work: BMET  In 1 week. If you have labs (blood work) drawn today and your tests are completely normal, you will receive your results only by: [place] (if you have MyChart) OR A paper copy in the mail If you have any lab test that is abnormal or we need to change your treatment, we will call you to review the results.   Testing/Procedures: No Testing   Follow-Up: At [facility], you and your health needs are our priority.  As part of our continuing mission to provide you with exceptional heart care, we have created designated Provider Care Teams.  These Care Teams include your primary Cardiologist (physician) and Advanced Practice Providers (APPs -  Physician Assistants and Nurse Practitioners) who all work together to provide you with the care you need, when you need it.  We recommend signing up for the patient portal called "MyChart".  Sign up information is provided on this After Visit Summary.  MyChart is used to connect with patients for Virtual Visits (Telemedicine).  Patients are able to view lab/test results, encounter notes, upcoming appointments, etc.  Non-urgent messages can be sent to your provider as well.   To learn more about what you can do with MyChart, go to [email or web address].    Your next appointment:   4-6 months  The format for your next appointment:   In Person  Provider:   Kirk Ruths, MD     Other Instructions Monitor Blood Pressure Daily. If Blood Pressure consistently over 130/80 Please call office.    Signed, Darreld Mclean, PA-C  08/25/2021 12:38 PM    [facility]

## 2021-08-13 ENCOUNTER — Other Ambulatory Visit: Payer: Self-pay

## 2021-08-13 MED ORDER — DAPAGLIFLOZIN PROPANEDIOL 10 MG PO TABS: 10.0000 mg | ORAL_TABLET | Freq: Every day | ORAL | 2 refills | Status: DC

## 2021-08-13 MED ORDER — PREGABALIN 75 MG PO CAPS
75.0000 mg | ORAL_CAPSULE | Freq: Two times a day (BID) | ORAL | 0 refills | Status: DC
Start: 2021-08-13 — End: 2021-08-25

## 2021-08-13 NOTE — Addendum Note (Signed)
Addended by: Binnie Rail on: 08/13/2021 04:58 PM ? ? Modules accepted: Orders ? ?

## 2021-08-18 ENCOUNTER — Other Ambulatory Visit: Payer: Self-pay

## 2021-08-18 ENCOUNTER — Ambulatory Visit: Payer: PPO | Attending: Nurse Practitioner | Admitting: Physical Therapy

## 2021-08-18 ENCOUNTER — Encounter: Payer: Self-pay | Admitting: Physical Therapy

## 2021-08-18 DIAGNOSIS — R2681 Unsteadiness on feet: Secondary | ICD-10-CM | POA: Insufficient documentation

## 2021-08-18 DIAGNOSIS — R2689 Other abnormalities of gait and mobility: Secondary | ICD-10-CM | POA: Diagnosis not present

## 2021-08-18 DIAGNOSIS — M6281 Muscle weakness (generalized): Secondary | ICD-10-CM | POA: Diagnosis not present

## 2021-08-18 NOTE — Therapy (Signed)
[facility] [address] [place], [place], [address] Phone: [phone]   Fax:  [phone]  Physical Therapy Treatment  Patient Details  Name: Jonathan [name] MRN: [id number] Date of Birth: [birth year] Referring Provider (PT): Angus Seller, NP   Encounter Date: 08/18/2021   PT End of Session - 08/18/21 1101     Visit Number 18    Number of Visits 20    Date for PT Re-Evaluation 09/02/21    Authorization Type [employer]    Progress Note Due on Visit 19   *Progress note completed on 9th visit   PT Start Time 1018    PT Stop Time 1059    PT Time Calculation (min) 41 min    Equipment Utilized During Treatment Gait belt    Activity Tolerance Patient tolerated treatment well    Behavior During Therapy WFL for tasks assessed/performed             Past Medical History:  Diagnosis Date   Acute encephalopathy 05/08/2019   Allergic rhinitis, seasonal 12/04/2011   Arthritis of hand 11/08/2020   BPH (benign prostatic hyperplasia)    Bronchitis    Cataract    Chronic anticoagulation 12/21/2017   CHADS VASC=4, Eliquis stopped Sept 2021- recurrent falls with SDH   Chronic atrial fibrillation    On Eliquis   Chronic pain, legs and back 03/10/2017   Preferred pain management Leg and back pain   Compression fracture of thoracic vertebra 09/21/2017   COPD (chronic obstructive pulmonary disease)    2 liters O2 HS   Degeneration of thoracic intervertebral disc 09/21/2017   Degenerative joint disease of hand 07/14/2017   Dupuytren's disease of palm 07/12/2019   Essential hypertension 12/04/2011   Fatty tumor    waste and back   Fibromyalgia    Generalized anxiety disorder 06/12/2020   12/21 He is reporting severe insomnia and anxiety symptoms.  Options are limited.  We will try a low-dose lorazepam at night and during the day for anxiety and panic attacks.  He will stop lorazepam if problems.  He will stop drinking alcohol.   Discontinue BuSpar.  Reduce Cymbalta to 1 a day.   GERD (gastroesophageal reflux disease)    Glaucoma 03/08/2016   Gout 03/08/2016   Hereditary and idiopathic peripheral neuropathy 08/28/2015   History of COVID-19 05/08/2019   2021 Post-COVID sx's -" brain fog" Try Lion's mane supplement and B complex with niacin for neuropathy   Hyperlipidemia 03/10/2017   Hypothyroidism    Hypoxia    Insomnia    Leukocytosis 03/14/2020   Major depressive disorder 09/07/2016   Obstructive sleep apnea    Osteoarthritis    Osteoporosis    Recurrent falls 03/25/2020   PT. Treat neuropathy, insomnia.  Reduce Cymbalta to 1 a day.  Discontinue BuSpar.   Rotator cuff arthropathy of left shoulder 07/21/2019   SDH (subdural hematoma) 03/21/2020   Recurrent SDH secondary to falls at home- Sept 2019 and again 03/14/2020- Eliquis stopped.   Sebaceous cyst 06/27/2019   Spinal compression fracture seventh vertebre   Spondylolisthesis 09/07/2016   On fosamax - for about one year - Dr Ronne Binning  10/14/17 dexa: normal dexa -- spine 2.1,   RFN -0.9,  LFN   -0.7   - no comparison on file, previous fracture      Thoracic back pain 08/10/2017   Thrombocytopenia 12/20/2020   TIA (transient ischemic attack) 08/28/2015   Type 2 diabetes mellitus 03/08/2016  Past Surgical History:  Procedure Laterality Date   APPENDECTOMY  age 88   CARPAL TUNNEL RELEASE Right    early 2000s   CATARACT EXTRACTION     bilateral   CHOLECYSTECTOMY  age 88   EYE SURGERY     FOOT ARTHRODESIS Right 02/02/2013   Procedure: RIGHT HALLUX METATARSAL PHALANGEAL JOINT ARTHRODESIS ;  Surgeon: Wylene Simmer, MD;  Location: [facility];  Service: Orthopedics;  Laterality: Right;   INGUINAL HERNIA REPAIR  age 88   rt side   NASAL CONCHA BULLOSA RESECTION  age 88   PROSTATE SURGERY     SHOULDER ARTHROSCOPY W/ ROTATOR CUFF REPAIR Right    early 2000s   tonsil     VASECTOMY  age 88    There were no vitals filed for this visit.    Subjective Assessment - 08/18/21 1020     Subjective Had a small fall yesterday. Was carrying books down the stairs, lost his balance and sat down. Denies injuries.    Pertinent History PMH: pAF on Eliquis (to see cardiologist on 04/23/21), chronic pain on oxycodone and Ambien, previous SDH, COPD on home O2 noct, HTN, hypothyroidism, OSA on CPAP, and DM    Diagnostic tests 07/25/21 chest xray negative, head CT showed no acute intracranial abnormality    Patient Stated Goals Pt's goal for therapy is to improve balance, strengthening, and walking.    Currently in Pain? Yes    Pain Score 6     Pain Location Knee    Pain Orientation Left    Pain Descriptors / Indicators Aching    Pain Type Chronic pain                               [facility] Adult PT Treatment/Exercise - 08/18/21 0001       Ambulation/Gait   Ambulation Distance (Feet) 800 Feet    Gait Pattern Step-through pattern;Poor foot clearance - right;Poor foot clearance - left;Lateral hip instability    Ambulation Surface Level;Unlevel;Outdoor;Paved;Grass    Gait Comments gait training with patient's  walking stick while performing head turns, turning, reciting recipe from memory; most imbalane with head turns L; cues required to avoid forgetting walking stick behind him with turns      Knee/Hip Exercises: Aerobic   Nustep Level 5, UEs/LEs  x 6 minutes for leg strength, flexibility.      Knee/Hip Exercises: Standing   Forward Step Up Both;2 sets;10 reps;Hand Hold: 0;Step Height: 6"    Forward Step Up Limitations 10x step + opposite SKTC with 1 UE support, 10x alt step up + green medball at chest      Knee/Hip Exercises: Seated   Sit to Sand without UE support;2 sets;5 reps   on foam + green medball at chest and behind back pass                      PT Short Term Goals - 07/22/21 1045       PT SHORT TERM GOAL #1   Title Pt will be independent with progression of HEP for improved strength, balance,  transfers, and gait.  TARGET 07/11/2021    Time 3    Period Weeks    Status Partially Met   met for current   Target Date 08/12/21      PT SHORT TERM GOAL #2   Title Pt will improve 5x sit<>stand to less than or  equal to 11.5 sec to demonstrate improved functional strength and transfer efficiency.    Baseline 12.38 sec with decreased forward lean to initiate; 05/13/21:  15.47 sec, 2nd trial 12.38 sec;    Time 3    Period Weeks    Status On-going   12.14 sec on 07/22/21   Target Date 08/12/21               PT Long Term Goals - 07/28/21 1555       PT LONG TERM GOAL #1   Title Pt will be independent with final HEP for improved strength, balance, transfers, and gait.  TARGET 07/25/2021    Time 6    Period Weeks    Status Partially Met   reports partial compliance; denies questions   Target Date 09/02/21      PT LONG TERM GOAL #2   Title Pt will improve FGA score to at least 24/30 to decrease fall risk.    Baseline 15/24; 21/24 06/10/2021    Time 6    Status On-going   21/30 on 02/07   Target Date 09/02/21      PT LONG TERM GOAL #3   Title Patient to complete 457m on 6 minute walk test with LRAD. (>1300 ft)    Baseline 1206 ft    Time 6    Period Weeks    Status On-going   1209 ft on 07/28/21   Target Date 09/02/21      PT LONG TERM GOAL #4   Title Pt will verbalize plans for continued community fitness upon d/c from PT to maximize gains made in therapy.    Time 6    Period Weeks    Status Partially Met   currently walking 2-3x/week but has not started gym activities d/t low motivation   Target Date 09/02/21                   Plan - 08/18/21 1101     Clinical Impression Statement Patient arrived to session with report of having a small LOB while carrying books down the stairs, requiring him to sit down. Denies injuries. Worked on longed distance walking with patients walking stick with additional balance challenges. Most imbalance evident with head turns L  and cues required for walking stick sequencing. Step ups were practiced with additional balance challenges to improve stability on stairs. Patient with occasional L lateral lean that required corrective cues. Also worked on STS transfers on complaint surface and with weighted r4esistance with cues for form and descending control. Patient tolerated session well and without complaints at end of session.    Comorbidities PMH: pAF on Eliquis, chronic pain on oxycodone and Ambien, previous SDH, COPD on home O2 noct, HTN, hypothyroidism, OSA on CPAP, and DM, peripheral neuropathy    PT Frequency 1x / week    PT Duration 8 weeks    PT Treatment/Interventions ADLs/Self Care Home Management;Gait training;Functional mobility training;Therapeutic activities;Therapeutic exercise;Balance training;Neuromuscular re-education;Patient/family education    PT Next Visit Plan gait train with walking stick; continue turning activities and backwards stepping/walking, vestibular training for balance, lateral weigthshifting and hip strengthening, gait with head turns/nods and compliant surfaces.  Try resisted gait and side step activities for balance recovery    Consulted and Agree with Plan of Care Patient             Patient will benefit from skilled therapeutic intervention in order to improve the following deficits and impairments:  Abnormal gait, Difficulty walking,  Decreased balance, Decreased mobility, Decreased strength  Visit Diagnosis: Unsteadiness on feet  Muscle weakness (generalized)  Other abnormalities of gait and mobility     Problem List Patient Active Problem List   Diagnosis Date Noted   Fall 07/25/2021   Confusion 07/25/2021   Unsteadiness 07/25/2021   Rib pain on left side 07/25/2021   Aortic atherosclerosis ([place]) 02/04/2021   Cognitive deficits 01/04/2021   Fatty liver 12/29/2020   Thrombocytopenia 12/20/2020   Urinary frequency 12/20/2020   Arthritis of hand 11/08/2020    Generalized anxiety disorder 06/12/2020   Recurrent falls 03/25/2020   History of SDH 03/21/2020   Rotator cuff arthropathy of left shoulder 07/21/2019   Dupuytren's disease of palm 07/12/2019   Sebaceous cyst 06/27/2019   History of COVID-19 05/08/2019   Obstructive sleep apnea    Asymmetrical left sensorineural hearing loss 07/12/2018   Chronic anticoagulation 12/21/2017   Degeneration of thoracic intervertebral disc 09/21/2017   Thoracic back pain 08/10/2017   Degenerative joint disease of hand 07/14/2017   Osteoarthritis    Fibromyalgia    Fatty tumor    BPH (benign prostatic hyperplasia)    Chronic atrial fibrillation    Insomnia 03/10/2017   Chronic pain, legs and back 03/10/2017   Hyperlipidemia 03/10/2017   Obesity (BMI 30.0-34.9) 12/08/2016   Depression 09/07/2016   Spondylolisthesis 09/07/2016   Hypothyroidism 03/08/2016   GERD (gastroesophageal reflux disease) 03/08/2016   Gout 03/08/2016   Glaucoma 03/08/2016   Type 2 diabetes mellitus 03/08/2016   Hereditary and idiopathic peripheral neuropathy 08/28/2015   History of TIA 08/28/2015   Essential hypertension 12/04/2011   Allergic rhinitis, seasonal 12/04/2011   COPD (chronic obstructive pulmonary disease)  07/18/2011    Janene Harvey, PT, DPT 08/18/21 12:54 PM   [facility] [address] [place], [name], [address] Phone: [phone]   Fax:  [phone]  Name: Jonathan [name] MRN: [id number] Date of Birth: 01-06-1933

## 2021-08-22 ENCOUNTER — Encounter: Payer: Self-pay | Admitting: Internal Medicine

## 2021-08-24 ENCOUNTER — Encounter: Payer: Self-pay | Admitting: Student

## 2021-08-25 ENCOUNTER — Encounter: Payer: Self-pay | Admitting: Physical Therapy

## 2021-08-25 ENCOUNTER — Ambulatory Visit (INDEPENDENT_AMBULATORY_CARE_PROVIDER_SITE_OTHER): Payer: PPO | Admitting: Student

## 2021-08-25 ENCOUNTER — Other Ambulatory Visit: Payer: Self-pay

## 2021-08-25 ENCOUNTER — Encounter: Payer: Self-pay | Admitting: Student

## 2021-08-25 ENCOUNTER — Ambulatory Visit: Payer: PPO | Admitting: Physical Therapy

## 2021-08-25 VITALS — BP 138/72 | HR 59 | Ht 66.0 in | Wt 173.0 lb

## 2021-08-25 DIAGNOSIS — R2681 Unsteadiness on feet: Secondary | ICD-10-CM | POA: Diagnosis not present

## 2021-08-25 DIAGNOSIS — I340 Nonrheumatic mitral (valve) insufficiency: Secondary | ICD-10-CM | POA: Diagnosis not present

## 2021-08-25 DIAGNOSIS — Z87898 Personal history of other specified conditions: Secondary | ICD-10-CM

## 2021-08-25 DIAGNOSIS — I4819 Other persistent atrial fibrillation: Secondary | ICD-10-CM

## 2021-08-25 DIAGNOSIS — R55 Syncope and collapse: Secondary | ICD-10-CM

## 2021-08-25 DIAGNOSIS — E785 Hyperlipidemia, unspecified: Secondary | ICD-10-CM | POA: Diagnosis not present

## 2021-08-25 DIAGNOSIS — R5382 Chronic fatigue, unspecified: Secondary | ICD-10-CM | POA: Diagnosis not present

## 2021-08-25 DIAGNOSIS — I10 Essential (primary) hypertension: Secondary | ICD-10-CM

## 2021-08-25 DIAGNOSIS — E119 Type 2 diabetes mellitus without complications: Secondary | ICD-10-CM | POA: Diagnosis not present

## 2021-08-25 DIAGNOSIS — G4733 Obstructive sleep apnea (adult) (pediatric): Secondary | ICD-10-CM | POA: Diagnosis not present

## 2021-08-25 DIAGNOSIS — R2689 Other abnormalities of gait and mobility: Secondary | ICD-10-CM

## 2021-08-25 MED ORDER — PREGABALIN 75 MG PO CAPS: 75.0000 mg | ORAL_CAPSULE | Freq: Two times a day (BID) | ORAL | 0 refills | Status: DC

## 2021-08-25 MED ORDER — TELMISARTAN 40 MG PO TABS: 40.0000 mg | ORAL_TABLET | Freq: Every day | ORAL | 3 refills | Status: DC

## 2021-08-25 NOTE — Therapy (Signed)
[facility] [address], [place], [name], [address] Phone: [phone]   Fax:  [phone]  Physical Therapy Treatment/PROGRESS NOTE  Patient Details  Name: Jonathan [name] MRN: [id number] Date of Birth: April 18, 1933 Referring Provider (PT): Lazaro Arms, NP   Encounter Date: 08/25/2021   PT End of Session - 08/25/21 1203     Visit Number 19    Number of Visits 20    Date for PT Re-Evaluation 09/02/21    Authorization Type [employer]    Progress Note Due on Visit 19   *Progress note completed on 9th visit   PT Start Time 1107    PT Stop Time 1152    PT Time Calculation (min) 45 min    Activity Tolerance Patient tolerated treatment well    Behavior During Therapy [facility] for tasks assessed/performed             Past Medical History:  Diagnosis Date   Acute encephalopathy 05/08/2019   Allergic rhinitis, seasonal 12/04/2011   Arthritis of hand 11/08/2020   BPH (benign prostatic hyperplasia)    Cataract    Chronic anticoagulation 12/21/2017   CHADS VASC=4, Eliquis stopped Sept 2021- recurrent falls with SDH   Chronic pain, legs and back 03/10/2017   Preferred pain management Leg and back pain   Compression fracture of thoracic vertebra 09/21/2017   COPD (chronic obstructive pulmonary disease)    2 liters O2 HS   Degeneration of thoracic intervertebral disc 09/21/2017   Degenerative joint disease of hand 07/14/2017   Dupuytren's disease of palm 07/12/2019   Essential hypertension 12/04/2011   Fatty tumor    waste and back   Fibromyalgia    Generalized anxiety disorder 06/12/2020   12/21 He is reporting severe insomnia and anxiety symptoms.  Options are limited.  We will try a low-dose lorazepam at night and during the day for anxiety and panic attacks.  He will stop lorazepam if problems.  He will stop drinking alcohol.  Discontinue BuSpar.  Reduce Cymbalta to 1 a day.   GERD (gastroesophageal reflux disease)     Glaucoma 03/08/2016   Gout 03/08/2016   Hereditary and idiopathic peripheral neuropathy 08/28/2015   History of COVID-19 05/08/2019   2021 Post-COVID sx's -" brain fog" Try Lion's mane supplement and B complex with niacin for neuropathy   Hyperlipidemia 03/10/2017   Hypothyroidism    Hypoxia    Insomnia    Major depressive disorder 09/07/2016   Obstructive sleep apnea    Osteoarthritis    Osteoporosis    Persistent atrial fibrillation ([place])    on Eliquis   Recurrent falls 03/25/2020   PT. Treat neuropathy, insomnia.  Reduce Cymbalta to 1 a day.  Discontinue BuSpar.   Rotator cuff arthropathy of left shoulder 07/21/2019   SDH (subdural hematoma) 03/21/2020   Recurrent SDH secondary to falls at home- Sept 2019 and again 03/14/2020- Eliquis stopped.   Sebaceous cyst 06/27/2019   Spinal compression fracture seventh vertebre   Spondylolisthesis 09/07/2016   On fosamax - for about one year - Dr Alyson Ingles  10/14/17 dexa: normal dexa -- spine 2.1,   RFN -0.9,  LFN   -0.7   - no comparison on file, previous fracture      Thoracic back pain 08/10/2017   Thrombocytopenia 12/20/2020   TIA (transient ischemic attack) 08/28/2015   Type 2 diabetes mellitus 03/08/2016    Past Surgical History:  Procedure Laterality Date   APPENDECTOMY  age 60  CARPAL TUNNEL RELEASE Right    early 2000s   CATARACT EXTRACTION     bilateral   CHOLECYSTECTOMY  age 35   EYE SURGERY     FOOT ARTHRODESIS Right 02/02/2013   Procedure: RIGHT HALLUX METATARSAL PHALANGEAL JOINT ARTHRODESIS ;  Surgeon: Wylene Simmer, MD;  Location: [facility];  Service: Orthopedics;  Laterality: Right;   INGUINAL HERNIA REPAIR  age 57   rt side   NASAL CONCHA BULLOSA RESECTION  age 11   PROSTATE SURGERY     SHOULDER ARTHROSCOPY W/ ROTATOR CUFF REPAIR Right    early 2000s   tonsil     VASECTOMY  age 73    There were no vitals filed for this visit.   Subjective Assessment - 08/25/21 1109     Subjective No other  falls, no changes; carried things down the stairs yesterday, with no problems.  Working on decluttering, packing in preparation to move to [facility] 5/17.    Pertinent History PMH: pAF on Eliquis (to see cardiologist on 04/23/21), chronic pain on oxycodone and Ambien, previous SDH, COPD on home O2 noct, HTN, hypothyroidism, OSA on CPAP, and DM    Diagnostic tests 07/25/21 chest xray negative, head CT showed no acute intracranial abnormality    Patient Stated Goals Pt's goal for therapy is to improve balance, strengthening, and walking.    Currently in Pain? Yes    Pain Score 3     Pain Location Back    Pain Descriptors / Indicators Aching    Pain Type Chronic pain    Pain Onset More than a month ago    Pain Frequency Constant    Aggravating Factors  general pain, sitting at MD office on table without back support    Pain Relieving Factors heating pad                [facility] PT Assessment - 08/25/21 0001       6 Minute Walk- Baseline   6 Minute Walk- Baseline yes    BP (mmHg) 160/71    HR (bpm) 52    02 Sat (%RA) 96 %    Modified Borg Scale for Dyspnea 0- Nothing at all      6 Minute walk- Post Test   6 Minute Walk Post Test yes    BP (mmHg) 152/71    HR (bpm) 56    02 Sat (%RA) 97 %    Modified Borg Scale for Dyspnea 1- Very mild shortness of breath    Perceived Rate of Exertion (Borg) 9- very light      6 minute walk test results    Endurance additional comments 1180      Functional Gait  Assessment   Gait assessed  Yes    Gait Level Surface Walks 20 ft in less than 7 sec but greater than 5.5 sec, uses assistive device, slower speed, mild gait deviations, or deviates 6-10 in outside of the 12 in walkway width.    Change in Gait Speed Able to change speed, demonstrates mild gait deviations, deviates 6-10 in outside of the 12 in walkway width, or no gait deviations, unable to achieve a major change in velocity, or uses a change in velocity, or uses an assistive device.     Gait with Horizontal Head Turns Performs head turns smoothly with slight change in gait velocity (eg, minor disruption to smooth gait path), deviates 6-10 in outside 12 in walkway width, or uses an assistive device.  Gait with Vertical Head Turns Performs task with slight change in gait velocity (eg, minor disruption to smooth gait path), deviates 6 - 10 in outside 12 in walkway width or uses assistive device    Gait and Pivot Turn Pivot turns safely within 3 sec and stops quickly with no loss of balance.    Step Over Obstacle Is able to step over 2 stacked shoe boxes taped together (9 in total height) without changing gait speed. No evidence of imbalance.    Gait with Narrow Base of Support Ambulates less than 4 steps heel to toe or cannot perform without assistance.    Gait with Eyes Closed Walks 20 ft, uses assistive device, slower speed, mild gait deviations, deviates 6-10 in outside 12 in walkway width. Ambulates 20 ft in less than 9 sec but greater than 7 sec.    Ambulating Backwards Walks 20 ft, uses assistive device, slower speed, mild gait deviations, deviates 6-10 in outside 12 in walkway width.    Steps Alternating feet, no rail.    Total Score 21                           [facility] Adult PT Treatment/Exercise - 08/25/21 0001       Transfers   Transfers Sit to Stand;Stand to Sit    Sit to Stand 6: Modified independent (Device/Increase time);Without upper extremity assist;From chair/3-in-1    Five time sit to stand comments  11.69    Stand to Sit 6: Modified independent (Device/Increase time);Without upper extremity assist;To chair/3-in-1      Standardized Balance Assessment   Standardized Balance Assessment Timed Up and Go Test      Timed Up and Go Test   TUG Normal TUG    Normal TUG (seconds) 10.25      Self-Care   Self-Care Other Self-Care Comments    Other Self-Care Comments  Discussed objective measures and scores of FGA and 6MWT measures staying the same  (or slightly slowed in 6MWT).  Discussed likely plans for discharge next visit due to measures platueaing in therapy.  Discussed strategies to help improve stability/compensate for decreased balance:  use walking pole for safety with gait, use rail for stair safety and avoid carrying items in both hands on stairs, use plenty of light and avoid darkness.  Discussed factors limiting pt's progress:  pt's reported lack of consistency in participating in HEP, stressors involved with decluttering home in preparation to pack and move to [facility].  Pt is agreeable to plans for d/c next visit.                     PT Education - 08/25/21 1202     Education Details See Self-care    Person(s) Educated Patient    Methods Explanation    Comprehension Verbalized understanding              PT Short Term Goals - 07/22/21 1045       PT SHORT TERM GOAL #1   Title Pt will be independent with progression of HEP for improved strength, balance, transfers, and gait.  TARGET 07/11/2021    Time 3    Period Weeks    Status Partially Met   met for current   Target Date 08/12/21      PT SHORT TERM GOAL #2   Title Pt will improve 5x sit<>stand to less than or equal to 11.5 sec to demonstrate improved  functional strength and transfer efficiency.    Baseline 12.38 sec with decreased forward lean to initiate; 05/13/21:  15.47 sec, 2nd trial 12.38 sec;    Time 3    Period Weeks    Status On-going   12.14 sec on 07/22/21   Target Date 08/12/21               PT Long Term Goals - 08/25/21 1203       PT LONG TERM GOAL #1   Title Pt will be independent with final HEP for improved strength, balance, transfers, and gait.  TARGET 07/25/2021    Time 6    Period Weeks    Status Partially Met   reports partial compliance; denies questions   Target Date 09/02/21      PT LONG TERM GOAL #2   Title Pt will improve FGA score to at least 24/30 to decrease fall risk.    Baseline FGA 21/30 06/10/21;  21/30 08/25/2021    Time 6    Status Not Met   21/30 on 02/07   Target Date 09/02/21      PT LONG TERM GOAL #3   Title Patient to complete 428mon 6 minute walk test with LRAD. (>1300 ft)    Baseline 1206 ft; 1180 ft 08/25/2021    Time 6    Period Weeks    Status Not Met   1209 ft on 07/28/21   Target Date 09/02/21      PT LONG TERM GOAL #4   Title Pt will verbalize plans for continued community fitness upon d/c from PT to maximize gains made in therapy.    Time 6    Period Weeks    Status Partially Met   currently walking 2-3x/week but has not started gym activities d/t low motivation   Target Date 09/02/21                   Plan - 08/25/21 1204     Clinical Impression Statement 10th Visit Progress note, covering dates 05/16/2021-08/25/2021.  Subjective reports:  Pt states he is off-balance at times with gait; he doesn't always use the walking pole.  He reports not being able to consistently performing HEP due to decluttering, packing [facility]  Objective measures:  FGA 21/30 (same as last 2 times assessed) and 6 MWT 1180 ft (decreased slightly from 1206 and 1209 ft when last assessed).  Discussed that with pt's basically unchanged scores, that we need to focus on compensations for balance and educated pt on those today (he has also already been educated in fall prevention).  Discussed factors in pt's decreased progress at this time have to do with inconsistent HEP performance and stressors surrounding upcoming move.  Will plan to assess remaining goals and plan for d/c next visit.    Comorbidities PMH: pAF on Eliquis, chronic pain on oxycodone and Ambien, previous SDH, COPD on home O2 noct, HTN, hypothyroidism, OSA on CPAP, and DM, peripheral neuropathy    PT Frequency 1x / week    PT Duration 8 weeks    PT Treatment/Interventions ADLs/Self Care Home Management;Gait training;Functional mobility training;Therapeutic activities;Therapeutic  exercise;Balance training;Neuromuscular re-education;Patient/family education    PT Next Visit Plan Assess HEP and give pt 2-3 ex he could easily do daily.  Check remaining goals and plan for d/c.  Ask about stair negotiation at home    Consulted and Agree with Plan of Care Patient  Patient will benefit from skilled therapeutic intervention in order to improve the following deficits and impairments:  Abnormal gait, Difficulty walking, Decreased balance, Decreased mobility, Decreased strength  Visit Diagnosis: Unsteadiness on feet  Other abnormalities of gait and mobility     Problem List Patient Active Problem List   Diagnosis Date Noted   Confusion 07/25/2021   Rib pain on left side 07/25/2021   Aortic atherosclerosis ([place]) 02/04/2021   Cognitive deficits 01/04/2021   Fatty liver 12/29/2020   Thrombocytopenia 12/20/2020   Arthritis of hand 11/08/2020   Generalized anxiety disorder 06/12/2020   History of recurrent falls 03/25/2020   History of SDH 03/21/2020   Rotator cuff arthropathy of left shoulder 07/21/2019   Dupuytren's disease of palm 07/12/2019   Sebaceous cyst 06/27/2019   History of COVID-19 05/08/2019   Obstructive sleep apnea    Asymmetrical left sensorineural hearing loss 07/12/2018   Chronic anticoagulation 12/21/2017   Degeneration of thoracic intervertebral disc 09/21/2017   Thoracic back pain 08/10/2017   Degenerative joint disease of hand 07/14/2017   Osteoarthritis    Fibromyalgia    Fatty tumor    BPH (benign prostatic hyperplasia)    Insomnia 03/10/2017   Chronic pain, legs and back 03/10/2017   Hyperlipidemia 03/10/2017   Obesity (BMI 30.0-34.9) 12/08/2016   Depression 09/07/2016   Spondylolisthesis 09/07/2016   Hypothyroidism 03/08/2016   GERD (gastroesophageal reflux disease) 03/08/2016   Persistent atrial fibrillation ([place]) 03/08/2016   Glaucoma 03/08/2016   Type 2 diabetes mellitus 03/08/2016   Hereditary and idiopathic  peripheral neuropathy 08/28/2015   History of TIA 08/28/2015   Essential hypertension 12/04/2011   Allergic rhinitis, seasonal 12/04/2011   COPD (chronic obstructive pulmonary disease)  07/18/2011    Keilany Burnette W., PT 08/25/2021, 12:11 PM  [facility] [address] [place], [name], [address] Phone: [phone]   Fax:  [phone]  Name: Jonathan [name] MRN: [id number] Date of Birth: [birth year]

## 2021-08-25 NOTE — Patient Instructions (Signed)
Medication Instructions:  ?Increase Telmisartan to 40 mg ( Take 1 Tablet Daily). ?*If you need a refill on your cardiac medications before your next appointment, please call your pharmacy* ? ? ?Lab Work: ?BMET  In 1 week. ?If you have labs (blood work) drawn today and your tests are completely normal, you will receive your results only by: ?MyChart Message (if you have MyChart) OR ?A paper copy in the mail ?If you have any lab test that is abnormal or we need to change your treatment, we will call you to review the results. ? ? ?Testing/Procedures: ?No Testing ? ? ?Follow-Up: ?At [facility], you and your health needs are our priority.  As part of our continuing mission to provide you with exceptional heart care, we have created designated Provider Care Teams.  These Care Teams include your primary Cardiologist (physician) and Advanced Practice Providers (APPs -  Physician Assistants and Nurse Practitioners) who all work together to provide you with the care you need, when you need it. ? ?We recommend signing up for the patient portal called "MyChart".  Sign up information is provided on this After Visit Summary.  MyChart is used to connect with patients for Virtual Visits (Telemedicine).  Patients are able to view lab/test results, encounter notes, upcoming appointments, etc.  Non-urgent messages can be sent to your provider as well.   ?To learn more about what you can do with MyChart, go to [email or web address].   ? ?Your next appointment:   ?4-6 months ? ?The format for your next appointment:   ?In Person ? ?Provider:   ?Kirk Ruths, MD   ? ? ?Other Instructions ?Monitor Blood Pressure Daily. If Blood Pressure consistently over 130/80 Please call office.  ?

## 2021-08-27 ENCOUNTER — Other Ambulatory Visit: Payer: Self-pay | Admitting: Internal Medicine

## 2021-08-30 ENCOUNTER — Other Ambulatory Visit: Payer: PPO

## 2021-08-30 DIAGNOSIS — R55 Syncope and collapse: Secondary | ICD-10-CM | POA: Diagnosis not present

## 2021-09-01 ENCOUNTER — Ambulatory Visit: Payer: PPO | Admitting: Physical Therapy

## 2021-09-02 ENCOUNTER — Encounter: Payer: Self-pay | Admitting: Cardiology

## 2021-09-02 ENCOUNTER — Encounter: Payer: Self-pay | Admitting: *Deleted

## 2021-09-02 DIAGNOSIS — I480 Paroxysmal atrial fibrillation: Secondary | ICD-10-CM

## 2021-09-02 MED ORDER — DILTIAZEM HCL ER COATED BEADS 120 MG PO CP24: 120.0000 mg | ORAL_CAPSULE | Freq: Every day | ORAL | 3 refills | Status: DC

## 2021-09-02 NOTE — Telephone Encounter (Signed)
-----   Message from Lelon Perla, MD sent at 09/02/2021  7:17 AM EDT ----- ?Decrease Cardizem to 120 mg daily and follow blood pressure. ?Kirk Ruths ? ?

## 2021-09-02 NOTE — Telephone Encounter (Signed)
Pt returning a phone call to nurse Hilda Blades please advise  ?

## 2021-09-02 NOTE — Telephone Encounter (Signed)
Decrease Cardizem to 120 mg daily and follow blood pressure. ?Kirk Ruths ? ?Returned call to pt, informed pt that Dr Stanford Breed and Hilda Blades are out of the office this afternoon. Pt informed of providers result & recommendations. New rx sent as requested. Pt states that he is currently in [facility] and will return tomorrow and start then. Verbalized understanding.  ?

## 2021-09-02 NOTE — Telephone Encounter (Signed)
Left message for pt to call.

## 2021-09-03 NOTE — Telephone Encounter (Signed)
This encounter was created in error - please disregard.

## 2021-09-04 ENCOUNTER — Other Ambulatory Visit: Payer: Self-pay

## 2021-09-04 ENCOUNTER — Ambulatory Visit
Admission: RE | Admit: 2021-09-04 | Discharge: 2021-09-04 | Disposition: A | Discharge status: Home / Self Care | Payer: PPO | Source: Ambulatory Visit | Attending: Physician Assistant | Admitting: Physician Assistant

## 2021-09-04 ENCOUNTER — Telehealth: Payer: Self-pay

## 2021-09-04 DIAGNOSIS — I6782 Cerebral ischemia: Secondary | ICD-10-CM | POA: Diagnosis not present

## 2021-09-04 DIAGNOSIS — G319 Degenerative disease of nervous system, unspecified: Secondary | ICD-10-CM | POA: Diagnosis not present

## 2021-09-04 DIAGNOSIS — R413 Other amnesia: Secondary | ICD-10-CM | POA: Diagnosis not present

## 2021-09-04 NOTE — Telephone Encounter (Signed)
Please see MRI results, [facility] called to tell you to please see report ?

## 2021-09-04 NOTE — Telephone Encounter (Signed)
I left message to return call to office at 3:21 to discuss results  ?

## 2021-09-05 ENCOUNTER — Other Ambulatory Visit: Payer: Self-pay

## 2021-09-05 ENCOUNTER — Ambulatory Visit: Payer: PPO | Admitting: Physical Therapy

## 2021-09-05 NOTE — Telephone Encounter (Signed)
Pended orders once patient calls back will forward to [place].  ?

## 2021-09-05 NOTE — Telephone Encounter (Signed)
Left message to call office to discuss 8:00am 09/05/2021.  ?

## 2021-09-05 NOTE — Telephone Encounter (Signed)
Left message again to contact me at the office to discuss MRI results.  ?

## 2021-09-08 DIAGNOSIS — R5382 Chronic fatigue, unspecified: Secondary | ICD-10-CM | POA: Diagnosis not present

## 2021-09-08 DIAGNOSIS — I340 Nonrheumatic mitral (valve) insufficiency: Secondary | ICD-10-CM | POA: Diagnosis not present

## 2021-09-08 DIAGNOSIS — E119 Type 2 diabetes mellitus without complications: Secondary | ICD-10-CM | POA: Diagnosis not present

## 2021-09-08 DIAGNOSIS — R55 Syncope and collapse: Secondary | ICD-10-CM | POA: Diagnosis not present

## 2021-09-08 DIAGNOSIS — M25561 Pain in right knee: Secondary | ICD-10-CM | POA: Diagnosis not present

## 2021-09-08 DIAGNOSIS — M25562 Pain in left knee: Secondary | ICD-10-CM | POA: Diagnosis not present

## 2021-09-08 DIAGNOSIS — R079 Chest pain, unspecified: Secondary | ICD-10-CM | POA: Diagnosis not present

## 2021-09-08 DIAGNOSIS — M7110 Other infective bursitis, unspecified site: Secondary | ICD-10-CM | POA: Diagnosis not present

## 2021-09-08 DIAGNOSIS — E785 Hyperlipidemia, unspecified: Secondary | ICD-10-CM | POA: Diagnosis not present

## 2021-09-08 DIAGNOSIS — I4819 Other persistent atrial fibrillation: Secondary | ICD-10-CM | POA: Diagnosis not present

## 2021-09-08 DIAGNOSIS — I10 Essential (primary) hypertension: Secondary | ICD-10-CM | POA: Diagnosis not present

## 2021-09-08 DIAGNOSIS — G4733 Obstructive sleep apnea (adult) (pediatric): Secondary | ICD-10-CM | POA: Diagnosis not present

## 2021-09-08 NOTE — Telephone Encounter (Signed)
I left message on answer machine to contact office at 2:49 09/08/2021 to call regarding MRI results. ?

## 2021-09-08 NOTE — Telephone Encounter (Signed)
I left message again to contact office for these results.  ?

## 2021-09-08 NOTE — Telephone Encounter (Signed)
This encounter was created in error - please disregard.

## 2021-09-09 ENCOUNTER — Other Ambulatory Visit: Payer: Self-pay

## 2021-09-09 ENCOUNTER — Encounter: Payer: Self-pay | Admitting: Physical Therapy

## 2021-09-09 ENCOUNTER — Telehealth: Payer: Self-pay | Admitting: Physician Assistant

## 2021-09-09 ENCOUNTER — Ambulatory Visit: Payer: PPO | Admitting: Physical Therapy

## 2021-09-09 DIAGNOSIS — R2681 Unsteadiness on feet: Secondary | ICD-10-CM

## 2021-09-09 DIAGNOSIS — R413 Other amnesia: Secondary | ICD-10-CM

## 2021-09-09 DIAGNOSIS — R2689 Other abnormalities of gait and mobility: Secondary | ICD-10-CM

## 2021-09-09 LAB — BASIC METABOLIC PANEL
BUN/Creatinine Ratio: 16 (ref 10–24)
BUN: 15 mg/dL (ref 8–27)
CO2: 25 mmol/L (ref 20–29)
Calcium: 8.9 mg/dL (ref 8.6–10.2)
Chloride: 99 mmol/L (ref 96–106)
Creatinine, Ser: 0.91 mg/dL (ref 0.76–1.27)
Glucose: 101 mg/dL — ABNORMAL HIGH (ref 70–99)
Potassium: 4.3 mmol/L (ref 3.5–5.2)
Sodium: 143 mmol/L (ref 134–144)
eGFR: 82 mL/min/{1.73_m2} (ref >59)

## 2021-09-09 NOTE — Patient Instructions (Signed)
Access Code: [email or web address] ?URL: [email or web address] ?Date: 09/09/2021 ?Prepared by: [facility] ? ?Program Notes ?perform standing exercises at counter top for safety! ? ?Exercises ?- Forward and Backward Step Over with Beam (BKA)  - 1 x daily - 5 x weekly - 2 sets - 10 reps ?- Standing Tandem Balance with Counter Support  - 1 x daily - 5 x weekly - 1 sets - 3 reps - 30 sec hold ?- Gastroc Stretch on Wall  - 1 x daily - 5 x weekly - 2 sets - 30 sec hold ?- Heel Toe Raises with Counter Support  - 1 x daily - 5 x weekly - 2 sets - 10 reps ?- Side to side weightshift  - 1 x daily - 5 x weekly - 2 sets - 10 reps ?

## 2021-09-09 NOTE — Telephone Encounter (Signed)
Patient advised of MRI results, voiced understanding, sent orders to [facility]. Patient aware of this.  ?

## 2021-09-09 NOTE — Therapy (Signed)
[facility] ?[address], [address] ?[place], [name], [address] ?Phone: [phone]   Fax:  [phone] ? ?Physical Therapy Treatment/REcert/Discharge ? ?Patient Details  ?Name: Jonathan [name] ?MRN: [id number] ?Date of Birth: Dec 12, 1932 ?Referring Provider (PT): Lazaro Arms, NP ? ? ?PHYSICAL THERAPY DISCHARGE SUMMARY ? ?Visits from Start of Care: 20 ? ?Current functional level related to goals / functional outcomes: ? PT Long Term Goals - 09/09/21 0923   ? ?  ? PT LONG TERM GOAL #1  ? Title Pt will be independent with final HEP for improved strength, balance, transfers, and gait.  TARGET 07/25/2021   ? Baseline met per updates 09/09/21   ? Time 6   ? Period Weeks   ? Status Achieved   reports partial compliance; denies questions  ? Target Date 09/02/21   ?  ? PT LONG TERM GOAL #2  ? Title Pt will improve FGA score to at least 24/30 to decrease fall risk.   ? Baseline FGA 21/30 06/10/21; 21/30 08/25/2021   ? Time 6   ? Status Not Met   21/30 on 02/07  ? Target Date 09/02/21   ?  ? PT LONG TERM GOAL #3  ? Title Patient to complete 431mon 6 minute walk test with LRAD. (>1300 ft)   ? Baseline 1206 ft; 1180 ft 08/25/2021   ? Time 6   ? Period Weeks   ? Status Not Met   1209 ft on 07/28/21  ? Target Date 09/02/21   ?  ? PT LONG TERM GOAL #4  ? Title Pt will verbalize plans for continued community fitness upon d/c from PT to maximize gains made in therapy.   ? Baseline education to continue consistent performance of HEP   ? Time 6   ? Period Weeks   ? Status Achieved   currently walking 2-3x/week but has not started gym activities d/t low motivation  ? Target Date 09/02/21   ? ?  ?  ? ?  ? ? ?  ?Remaining deficits: ?Balance, fall risk ?  ?Education / Equipment: ?Educated in [employer] importance of consistency with HEP, fall prevention, use of cane for safety with gait.  ? ?Patient agrees to discharge. Patient goals were partially met. Patient is being discharged due to maximized rehab  potential.   Pt has multiple factors-medical appointments, medical issues, and upcoming move (stressors in regards to packing, decluttering) that are affecting progress with PT goals at this time. ? ?Mady Haagensen PT ?09/09/21 9:34 AM ?Phone: [phone]?Fax: [phone]? ? ?Encounter Date: 09/09/2021 ? ? PT End of Session - 09/09/21 0859   ? ? Visit Number 20   ? Number of Visits 20   ? Date for PT Re-Evaluation 09/09/21   ? Authorization Type [employer]   ? Progress Note Due on Visit 19   *Progress note completed on 9th visit  ? PT Start Time [phone]  ? PT Stop Time 0926   ? PT Time Calculation (min) 32 min   ? Activity Tolerance Patient tolerated treatment well   ? Behavior [facility]/performed   ? ?  ?  ? ?  ? ? ?Past Medical History:  ?Diagnosis Date  ? Acute encephalopathy 05/08/2019  ? Allergic rhinitis, seasonal 12/04/2011  ? Arthritis of hand 11/08/2020  ? BPH (benign prostatic hyperplasia)   ? Cataract   ? Chronic anticoagulation 12/21/2017  ? CHADS VASC=4, Eliquis stopped Sept 2021- recurrent falls  with SDH  ? Chronic pain, legs and back 03/10/2017  ? Preferred pain management Leg and back pain  ? Compression fracture of thoracic vertebra 09/21/2017  ? COPD (chronic obstructive pulmonary disease)   ? 2 liters O2 HS  ? Degeneration of thoracic intervertebral disc 09/21/2017  ? Degenerative joint disease of hand 07/14/2017  ? Dupuytren's disease of palm 07/12/2019  ? Essential hypertension 12/04/2011  ? Fatty tumor   ? waste and back  ? Fibromyalgia   ? Generalized anxiety disorder 06/12/2020  ? 12/21 He is reporting severe insomnia and anxiety symptoms.  Options are limited.  We will try a low-dose lorazepam at night and during the day for anxiety and panic attacks.  He will stop lorazepam if problems.  He will stop drinking alcohol.  Discontinue BuSpar.  Reduce Cymbalta to 1 a day.  ? GERD (gastroesophageal reflux disease)   ? Glaucoma 03/08/2016  ? Gout 03/08/2016  ?  Hereditary and idiopathic peripheral neuropathy 08/28/2015  ? History of COVID-19 05/08/2019  ? 2021 Post-COVID sx's -" brain fog" Try Lion's mane supplement and B complex with niacin for neuropathy  ? Hyperlipidemia 03/10/2017  ? Hypothyroidism   ? Hypoxia   ? Insomnia   ? Major depressive disorder 09/07/2016  ? Obstructive sleep apnea   ? Osteoarthritis   ? Osteoporosis   ? Persistent atrial fibrillation ([place])   ? on Eliquis  ? Recurrent falls 03/25/2020  ? PT. Treat neuropathy, insomnia.  Reduce Cymbalta to 1 a day.  Discontinue BuSpar.  ? Rotator cuff arthropathy of left shoulder 07/21/2019  ? SDH (subdural hematoma) 03/21/2020  ? Recurrent SDH secondary to falls at home- Sept 2019 and again 03/14/2020- Eliquis stopped.  ? Sebaceous cyst 06/27/2019  ? Spinal compression fracture seventh vertebre  ? Spondylolisthesis 09/07/2016  ? On fosamax - for about one year - Dr Alyson Ingles  10/14/17 dexa: normal dexa -- spine 2.1,   RFN -0.9,  LFN   -0.7   - no comparison on file, previous fracture     ? Thoracic back pain 08/10/2017  ? Thrombocytopenia 12/20/2020  ? TIA (transient ischemic attack) 08/28/2015  ? Type 2 diabetes mellitus 03/08/2016  ? ? ?Past Surgical History:  ?Procedure Laterality Date  ? APPENDECTOMY  age 41  ? CARPAL TUNNEL RELEASE Right   ? early 2000s  ? CATARACT EXTRACTION    ? bilateral  ? CHOLECYSTECTOMY  age 49  ? EYE SURGERY    ? FOOT ARTHRODESIS Right 02/02/2013  ? Procedure: RIGHT HALLUX METATARSAL PHALANGEAL JOINT ARTHRODESIS ;  Surgeon: Wylene Simmer, MD;  Location: [facility];  Service: Orthopedics;  Laterality: Right;  ? INGUINAL HERNIA REPAIR  age 51  ? rt side  ? NASAL CONCHA BULLOSA RESECTION  age 48  ? PROSTATE SURGERY    ? SHOULDER ARTHROSCOPY W/ ROTATOR CUFF REPAIR Right   ? early 2000s  ? tonsil    ? VASECTOMY  age 50  ? ? ?There were no vitals filed for this visit. ? ? Subjective Assessment - 09/09/21 0855   ? ? Subjective Went on our trip to [place] a fall in the lobby of  the hotel.  Knee is bothering me, but I saw Ortho MD yesterday. Nothing is broken, but it is swollen and bruised.  Feel that I'm having some executive function difficulties and had MRI.   ? Pertinent History PMH: pAF on Eliquis (to see cardiologist on 04/23/21), chronic pain on oxycodone and Ambien, previous SDH, COPD  on home O2 noct, HTN, hypothyroidism, OSA on CPAP, and DM   ? Diagnostic tests 07/25/21 chest xray negative, head CT showed no acute intracranial abnormality   ? Patient Stated Goals Pt's goal for therapy is to improve balance, strengthening, and walking.   ? Currently in Pain? Yes   ? Pain Score 2    ? Pain Location Knee   ? Pain Orientation Right   ? Pain Descriptors / Indicators Aching;Throbbing   ? Pain Type Chronic pain   ? Pain Onset More than a month ago   ? Pain Frequency Constant   ? Aggravating Factors  recent fall   ? Pain Relieving Factors antibiotics   ? ?  ?  ? ?  ? ? ? ? ? ? ? ? ? ? ? ? ? ? ? ? ? ? ? ? [facility] PT Treatment/Exercise - 09/09/21 0001   ? ?  ? Self-Care  ? Self-Care Other Self-Care Comments   ? Other Self-Care Comments  Educated pt in progress towards goals, plans for d/c this visit.  Educated pt in updates and importance of consistency of HEP, as well as compensations for balance:  use of cane, slowed pace, avoid carrying excess items both hands, rest when fatigued, plenty of light.  Discussed barriers that are affecting pt's balance:  pt's upcoming move, medical issues being followed by neurologist, cardiologist, and now orthopedist.   ? ?  ?  ? ?  ? ? ? ? ? ? Balance Exercises - 09/09/21 0001   ? ?  ? Balance Exercises: Standing  ? Tandem Stance Eyes open;Upper extremity support 1;30 secs;2 reps   ? Heel Raises Both;10 reps;Limitations   ? Toe Raise Both;10 reps;Limitations   ? Other Standing Exercises Wide BOS lateral weightshifting 10 reps; forward/back step over low obstacle x 10 reps, cues for heelstrike.   ? ?  ?  ? ?  ? ? ? ? ? PT Education - 09/09/21 0929   ? ?  Education Details Updates to HEP; progress towards goals and plans for d/c   ? Person(s) Educated Patient   ? Methods Explanation;Demonstration;Handout   ? Comprehension Verbalized understanding;Returned d

## 2021-09-09 NOTE — Telephone Encounter (Signed)
Patient is returning a call to christy 

## 2021-09-09 NOTE — Telephone Encounter (Signed)
Left message again and with patient's contact to contact office to discuss MRI results.  ?

## 2021-09-09 NOTE — Telephone Encounter (Signed)
Patient advised of Mri results, voiced understanding.  ?

## 2021-09-10 ENCOUNTER — Encounter: Payer: Self-pay | Admitting: Internal Medicine

## 2021-09-11 ENCOUNTER — Encounter: Payer: Self-pay | Admitting: Internal Medicine

## 2021-09-11 ENCOUNTER — Other Ambulatory Visit: Payer: Self-pay

## 2021-09-11 ENCOUNTER — Other Ambulatory Visit: Payer: Self-pay | Admitting: Physician Assistant

## 2021-09-11 DIAGNOSIS — R413 Other amnesia: Secondary | ICD-10-CM

## 2021-09-11 MED ORDER — ALLOPURINOL 300 MG PO TABS: 300.0000 mg | ORAL_TABLET | Freq: Every evening | ORAL | 3 refills | Status: DC

## 2021-09-15 DIAGNOSIS — M25571 Pain in right ankle and joints of right foot: Secondary | ICD-10-CM

## 2021-09-15 DIAGNOSIS — H401132 Primary open-angle glaucoma, bilateral, moderate stage: Secondary | ICD-10-CM | POA: Diagnosis not present

## 2021-09-15 HISTORY — DX: Pain in right ankle and joints of right foot: M25.571

## 2021-09-17 DIAGNOSIS — M7041 Prepatellar bursitis, right knee: Secondary | ICD-10-CM | POA: Diagnosis not present

## 2021-09-17 DIAGNOSIS — S8001XD Contusion of right knee, subsequent encounter: Secondary | ICD-10-CM | POA: Diagnosis not present

## 2021-09-23 DIAGNOSIS — R3915 Urgency of urination: Secondary | ICD-10-CM | POA: Diagnosis not present

## 2021-09-23 DIAGNOSIS — R350 Frequency of micturition: Secondary | ICD-10-CM | POA: Diagnosis not present

## 2021-09-23 DIAGNOSIS — N401 Enlarged prostate with lower urinary tract symptoms: Secondary | ICD-10-CM | POA: Diagnosis not present

## 2021-09-23 DIAGNOSIS — N476 Balanoposthitis: Secondary | ICD-10-CM | POA: Diagnosis not present

## 2021-09-24 ENCOUNTER — Ambulatory Visit
Admission: RE | Admit: 2021-09-24 | Discharge: 2021-09-24 | Disposition: A | Discharge status: Home / Self Care | Payer: PPO | Source: Ambulatory Visit | Attending: Physician Assistant | Admitting: Physician Assistant

## 2021-09-24 DIAGNOSIS — R413 Other amnesia: Secondary | ICD-10-CM | POA: Diagnosis not present

## 2021-09-26 ENCOUNTER — Other Ambulatory Visit: Payer: Self-pay

## 2021-09-26 MED ORDER — APIXABAN 5 MG PO TABS: 5.0000 mg | ORAL_TABLET | Freq: Two times a day (BID) | ORAL | 1 refills | Status: DC

## 2021-09-26 NOTE — Telephone Encounter (Signed)
Prescription refill request for Eliquis received. ?Indication:Afib ?Last office visit:3/13 ?Scr:0.9 ?Age: 88 ?Weight:78.5 kg ? ?Prescription refilled ? ?

## 2021-10-06 ENCOUNTER — Telehealth: Payer: Self-pay | Admitting: Physician Assistant

## 2021-10-06 ENCOUNTER — Encounter: Payer: Self-pay | Admitting: Internal Medicine

## 2021-10-06 ENCOUNTER — Ambulatory Visit (INDEPENDENT_AMBULATORY_CARE_PROVIDER_SITE_OTHER): Payer: PPO | Admitting: Internal Medicine

## 2021-10-06 VITALS — BP 130/60 | HR 52 | Ht 66.5 in | Wt 178.1 lb

## 2021-10-06 DIAGNOSIS — R159 Full incontinence of feces: Secondary | ICD-10-CM | POA: Diagnosis not present

## 2021-10-06 DIAGNOSIS — R197 Diarrhea, unspecified: Secondary | ICD-10-CM

## 2021-10-06 NOTE — Progress Notes (Signed)
? ?Chief Complaint: Fecal incontinence ? ?HPI : 88  year male with history of hypothyroidism, DM, fibromyalgia, TIA, A-fib on Eliquis, COPD, OSA, thrombocytopenia presents with fecal incontinence ? ?He worked as an Chartered certified accountant in the past. ? ?Interval History: His stools are formed and soft, about the same as when I last saw him in 07/2021. The stools tend to come several times per day in small spurts that are propelled by gas. He has about 4-6 BMs per day on average. Still having some fecal incontinence if he is not close enough to a bathroom to make it there in time. He is taking fiber supplements on occasion but not consistently. Has had difficulty following with the low FODMAP diet due to limited time in order to cook for him and his wife. He is not sure if he saw my MyChart message about his abdominal X-ray results. He is drinking diet sodas mostly during the daytime. Does not like drinking water. Drinking about 3 coffees per day. He and his wife are moving to a new home in [place] so they have been trying to get all of their things organized in preparation for the move.  ? ?Current Outpatient Medications  ?Medication Sig Dispense Refill  ? acetaminophen (TYLENOL) 650 MG CR tablet Take 1,300 mg by mouth in the morning and at bedtime.    ? acyclovir ointment (ZOVIRAX) 5 % Apply 1 application topically as needed.    ? allopurinol (ZYLOPRIM) 300 MG tablet Take 1 tablet (300 mg total) by mouth every evening. For gout 90 tablet 3  ? amiodarone (PACERONE) 200 MG tablet Take 200 mg by mouth daily.    ? apixaban (ELIQUIS) 5 MG TABS tablet Take 1 tablet (5 mg total) by mouth 2 (two) times daily. 180 tablet 1  ? atorvastatin (LIPITOR) 20 MG tablet Take 1 tablet (20 mg total) by mouth daily. For cholesterol 90 tablet 1  ? Calcium Citrate-Vitamin D (CALCIUM + D PO) Take 1 tablet by mouth daily.    ? carboxymethylcellulose (REFRESH PLUS) 0.5 % SOLN Place 1 drop into both eyes 3 (three) times daily as  needed (dry eyes).    ? clobetasol (TEMOVATE) 0.05 % external solution Apply 1 application topically 2 (two) times daily as needed (rash).    ? clotrimazole-betamethasone (LOTRISONE) cream Apply sparingly topical twice daily    ? Continuous Blood Gluc Receiver (FREESTYLE LIBRE 14 DAY READER) DEVI UAD to check sugars.  E11.65 1 each 0  ? Continuous Blood Gluc Sensor (FREESTYLE LIBRE 14 DAY SENSOR) MISC UAD to check sugars, E11.65 2 each 5  ? dapagliflozin propanediol (FARXIGA) 10 MG TABS tablet Take 1 tablet (10 mg total) by mouth daily before breakfast. For diabetes 90 tablet 2  ? diltiazem (CARDIZEM CD) 120 MG 24 hr capsule Take 1 capsule (120 mg total) by mouth daily. 30 capsule 3  ? diltiazem (CARDIZEM) 30 MG tablet Take 1 tablet (30 mg total) by mouth 2 (two) times daily as needed (sustained elevated heart rates >100 bpm for > 20 minutes.). 30 tablet 2  ? Emollient (RA RENEWAL DARK SPOT CORRECTOR EX) Apply 1 application topically daily as needed (dark spots).    ? FLUoxetine (PROZAC) 20 MG tablet Take 30 mg by mouth daily.    ? FLUoxetine (PROZAC) 40 MG capsule Take 1 capsule (40 mg total) by mouth daily. For depression 90 capsule 3  ? Fluticasone Propionate (ALLERGY RELIEF NA) Place 2 sprays into the nose daily as needed (allergies).    ?  glucose blood test strip Use to check blood sugar daily. E11.9 100 each 3  ? hydrochlorothiazide (HYDRODIURIL) 12.5 MG tablet Take 1 tablet (12.5 mg total) by mouth daily. 90 tablet 3  ? Lancets MISC Use to check blood sugars daily. E11.9 100 each 3  ? latanoprost (XALATAN) 0.005 % ophthalmic solution Place 1 drop into both eyes at bedtime.    ? levothyroxine (SYNTHROID) 112 MCG tablet TAKE 1 TABLET BY MOUTH EVERY MORNING BEFORE BREAKFAST 90 tablet 1  ? Melatonin 5 MG TABS Take 5 mg by mouth at bedtime.    ? mometasone (NASONEX) 50 MCG/ACT nasal spray Place 2 sprays into the nose as needed (allergies). 17 g 5  ? Multiple Vitamin (MULTIVITAMIN) tablet Take 1 tablet by mouth  daily.    ? nitroGLYCERIN (NITROSTAT) 0.4 MG SL tablet Place 1 tablet (0.4 mg total) under the tongue every 5 (five) minutes as needed for chest pain. 90 tablet 3  ? omeprazole (PRILOSEC) 20 MG capsule TAKE ONE CAPSULE BY MOUTH DAILY 90 capsule 1  ? oxyCODONE-acetaminophen (PERCOCET) 10-325 MG tablet Take 1 tablet by mouth every 8 (eight) hours as needed for pain.    ? pregabalin (LYRICA) 75 MG capsule Take 1 capsule (75 mg total) by mouth 2 (two) times daily. 180 capsule 0  ? Probiotic Product (PROBIOTIC PO) Take 1 capsule by mouth daily.    ? RESTASIS 0.05 % ophthalmic emulsion Place 1 drop into both eyes 2 (two) times daily.     ? Semaglutide (RYBELSUS) 7 MG TABS Take 7 mg by mouth daily. [employer] pt assistance 30 tablet 3  ? sildenafil (REVATIO) 20 MG tablet Take 40-100 mg by mouth daily as needed.    ? silodosin (RAPAFLO) 8 MG CAPS capsule Take 8 mg by mouth daily.    ? sulfamethoxazole-trimethoprim (BACTRIM DS) 800-160 MG tablet Take 1 tablet by mouth in the morning and at bedtime.    ? telmisartan (MICARDIS) 40 MG tablet Take 1 tablet (40 mg total) by mouth daily. 90 tablet 3  ? Vibegron (GEMTESA) 75 MG TABS Take 75 mg by mouth daily.    ? vitamin C (ASCORBIC ACID) 500 MG tablet Take 500 mg by mouth daily.    ? VITAMIN D, CHOLECALCIFEROL, PO Take 1 capsule by mouth daily.    ? ?No current facility-administered medications for this visit.  ? ?Review of Systems: ?All systems reviewed and negative except where noted in HPI.  ? ?Physical Exam: ?BP 130/60   Pulse (!) 52   Ht 5' 6.5" (1.689 m)   Wt 178 lb 2 oz (80.8 kg)   BMI 28.32 kg/m?  ?Constitutional: Pleasant,well-developed, male in no acute distress. ?HEENT: Normocephalic and atraumatic. Conjunctivae are normal. No scleral icterus. ?Cardiovascular: Normal rate, regular rhythm.  ?Pulmonary/chest: Effort normal and breath sounds normal. No wheezing, rales or rhonchi. ?Abdominal: Soft, nondistended, nontender. Bowel sounds active throughout. There are  no masses palpable. No hepatomegaly. ?Extremities: No edema ?Neurological: Alert and oriented to person place and time. ?Skin: Skin is warm and dry. No rashes noted. ?Psychiatric: Normal mood and affect. Behavior is normal. ? ?Labs 06/2021: CBC with low plt count of 97. Ferritin low at 26. Iron mildly low at 41, iron sat low at 9.7%. ? ?Labs 07/2021: CMP with mildly elevated Na of 148 and alk phos of 130. TSH nml. ? ?Abd U/S 12/26/20: ?IMPRESSION: ?1. Surgically absent gallbladder.  No biliary dilatation ?2. Increased echogenicity liver compatible with fatty infiltration. ?3. Spleen not enlarged. ? ?  KUB 08/07/21: ?IMPRESSION: ?1. Large volume of stool throughout the colon, sigmoid colonic ?redundancy. No evidence of obstruction. No significant colonic ?air-fluid levels as typically seen with diarrheal process. In the ?setting of diarrhea, this may represent overflow given the large ?stool burden. ?2. Possible 6 mm right renal stone. ? ?ASSESSMENT AND PLAN: ? ?Fecal incontinence  ?Diarrhea ?Patient presents with persistent diarrhea and fecal incontinence, though he has had difficulty with consistently implementing a low FODMAP diet and daily fiber supplement. His last KUB showed that he has a large volume of stool in his colon. Thus at this time I would favor that he is experiencing overflow diarrhea and incontinence. Will have him start off with conservative therapies for his constipation. Offered the patient pelvic floor PT but the patient does not have the time to do PT sessions right now in the midst of his move so I will give him some information on anal sphincter exercises that he could try at home.  ?- Low FODMAP diet ?- Drinks 8 cups of water per day ?- Walk 30 minutes per day ?- Start daily fiber  ?- Start daily Miralax ?- Gave anal sphincter exercises ?- Recommend reducing intake of artificial sugars and caffeine if possible ?- RTC 3 months. Consider pelvic PT in the future ? ?Christia Reading, MD ? ?

## 2021-10-06 NOTE — Patient Instructions (Addendum)
Drink 8 cups of water per day ? ?Take daily Miralax ? ?Fiber Supplement daily ? ?Anal Sphincter Exercises  ? ?If you are age 86 or older, your body mass index should be between 23-30. Your Body mass index is 28.32 kg/m?Marland Kitchen If this is out of the aforementioned range listed, please consider follow up with your Primary Care Provider. ? ?If you are age 44 or younger, your body mass index should be between 19-25. Your Body mass index is 28.32 kg/m?Marland Kitchen If this is out of the aformentioned range listed, please consider follow up with your Primary Care Provider.  ? ?________________________________________________________ ? ?The [facility] providers would like to encourage you to use [facility] to communicate with providers for non-urgent requests or questions.  Due to long hold times on the telephone, sending your provider a message by [facility] may be a faster and more efficient way to get a response.  Please allow 48 business hours for a response.  Please remember that this is for non-urgent requests.  ?_______________________________________________________  ? ?I appreciate the  opportunity to care for you ? ?Thank You  ? ?Sonny Masters Dorsey,MD ?

## 2021-10-06 NOTE — Telephone Encounter (Signed)
Patient called and stated he had some questions regarding an MRI and a MRA that he had done. ?

## 2021-10-13 ENCOUNTER — Encounter: Payer: Self-pay | Admitting: Physician Assistant

## 2021-10-13 ENCOUNTER — Telehealth: Payer: Self-pay | Admitting: Physician Assistant

## 2021-10-13 NOTE — Telephone Encounter (Signed)
Patient wants to speak to someone about his MRA that he had done please call  ?

## 2021-10-14 NOTE — Telephone Encounter (Signed)
Awaiting on Clarise Cruz for discuss this am ?

## 2021-10-15 NOTE — Telephone Encounter (Signed)
Will schedule for an appt on Friday may 5th   ?

## 2021-10-15 NOTE — Telephone Encounter (Signed)
Scheduled

## 2021-10-16 DIAGNOSIS — M25571 Pain in right ankle and joints of right foot: Secondary | ICD-10-CM | POA: Diagnosis not present

## 2021-10-16 DIAGNOSIS — M25561 Pain in right knee: Secondary | ICD-10-CM | POA: Diagnosis not present

## 2021-10-17 ENCOUNTER — Encounter: Payer: Self-pay | Admitting: Physician Assistant

## 2021-10-17 ENCOUNTER — Ambulatory Visit (INDEPENDENT_AMBULATORY_CARE_PROVIDER_SITE_OTHER): Payer: PPO | Admitting: Physician Assistant

## 2021-10-17 VITALS — BP 153/74 | HR 60 | Resp 18 | Wt 184.0 lb

## 2021-10-17 DIAGNOSIS — G3184 Mild cognitive impairment, so stated: Secondary | ICD-10-CM | POA: Diagnosis not present

## 2021-10-17 DIAGNOSIS — R2689 Other abnormalities of gait and mobility: Secondary | ICD-10-CM

## 2021-10-17 DIAGNOSIS — R55 Syncope and collapse: Secondary | ICD-10-CM | POA: Diagnosis not present

## 2021-10-17 DIAGNOSIS — G459 Transient cerebral ischemic attack, unspecified: Secondary | ICD-10-CM | POA: Diagnosis not present

## 2021-10-17 NOTE — Patient Instructions (Addendum)
Pleasure to see you again ?Repeat MRI brain to compare with the prior MRI ?Please call EMS if having any TIA symptoms  ?We have sent a referral to [facility] for your MRI and they will call you directly to schedule your appointment. They are located at [place]. If you need to contact them directly please call [phone].  ?

## 2021-10-17 NOTE — Progress Notes (Addendum)
?[facility] ?Neurology Division ?Clinic Note - Initial Visit ? ? ?Date: 10/17/21 ? ?Jonathan [name] ?MRN: [id number] ?DOB: [birth year] ? ? ?History of Present Illness: ?Jonathan [name] is a 88 y.o. r-handed male with with extensive medical history including aortic atherosclerosis, PAF on chronic Eliquis, hypertension, history of TIA, COPD, diabetes, OSA on CPAP, ITP (last platelet count 97), fibromyalgia, ADD-ADHD, GAD, hypothyroidism, insomnia, major depressive disorder, arthritis, history of SDH 04/03/2020 chronic back pain, DM2 and history of recurrent falls on physical therapy, history of alcohol abuse, polypharmacy due to multiple medical problems  seen today prior to his scheduled visit later in the year, to discuss the results of his imaging.  ?In review, the patient hat an MRI of the brain with Signal abnormality within the V4 left vertebral artery but with Flow voids otherwise preserved within the proximal large arterial vessels. MRA of the head and neck were performed to further investigate.  MRI of the head on 09/24/2021 had negative findings for both carotid and vertebral arteries.  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  His dominant right vertebral artery was widely patent.  Other intracranial atherosclerotic disease mild to moderate stenosis involving the distal M1 segment, and severe proximal left M2 branch stenosis with a short segment mild to moderate stenosis of the left P1 P2 junction were noted.  Although there is preservation of the asymmetry (diminished area, the patient complains of some issues with balance of unclear etiology.About 6 to 8 weeks ago, the patient felt that he was walking rapidly, and then began to leaning forward, tried to straighten but then he caught himself from falling.  He is not sure if he had any loss of sense during that time.  About 1 week ago, while being outside, he lost balance, "kind of  leaning to the side ", tried to reach to car, and then he fell "flat on the back on the left with a bruise of the heat ".  He does not feel that there was any structural damage, but loss of balance,  waking but not knowing how long its been unconscious, because he was alone.  He denies any history of seizures. He denies tongue biting, changes of taste or smell or muscle tightness,   He may have had some neck pain, and dizziness. Denied vertigo or vomiting.  He denies any hemiparesis, ataxia, diplopia, or speech difficulties. Denies any chest pain, or shortness of breath. Denies any fever or chills, or night sweats. No tobacco. No new meds or hormonal supplements. Denies any recent long distance trips or recent surgeries. No sick contacts. Patient is compliant with his medications.  He denies any recent increase in alcohol intake or cigarettes.  For both events, he never sought medical attention" EMS for further evaluation at the hospital, despite carrying a history of TIA. ? ? ?Out-side paper records, electronic medical record, and images have been reviewed where available and summarized as:  ? ? Initial visit 07/30/21 The patient is seen in neurologic consultation at the request of Burns, Claudina Lick, MD for the evaluation of memory.  The patient is here alone. [name] is a very pleasant 88 year old man with extensive medical history including type 2 diabetes, atrial fibrillation, hypertension, insomnia, hypothyroidism, hyperlipidemia, gout, chronic back pain, arthritis, GAD, OSA, and undiagnosed depression presenting here for evaluation of memory difficulties.  He had a comprehensive neurological evaluation by Dr. Si Raider in 2018 with normal results, without impaired performances  across testing.  At the time, his subjective cognitive difficulties were felt to be likely a combination of various medical and psychiatric conditions.  In 2020, his PCP referred him for repeat neuro testing due to subjective worsening of  his memory.  He is Neuropsych evaluation on 02/15/2019 continued to suggest neuropsychological functioning largely within normal limits, perhaps with a slight decline in day-to-day cognitive abilities, included ongoing psychiatric distress, and longstanding traits of ADHD.  Neuroimaging at the time suggested chronic small vessel ischemic changes in the cerebral white matter and a history of subdural hematoma.  Also his memory was affected by medical comorbidities and medication side effects of polypharmacy. On 12/20/2020, he saw Dr. Quay Burow in follow-up and he expressed a desire at that time for repeat neuropsychological evaluation due to subjective cognitive decline.  Extensive testing in July 2022 yielded a diagnosis of mild cognitive impairment, of unclear neurological etiology. ?  ?He had reports in today's visit decreased short-term memory, worse remembering details of prior conversations, forgetting to complete started task, and trouble remembering the names of familiar people. He denies any long-term memory issues.  He does report attention and concentration difficulties,never formally diagnosed with ADHD, he reports having been told he had issues with attention since adolescence.  These include trouble maintaining focus, easy distractibility, unable to finish projects, needing to reread passages several times, and instances of impulsivity.  For the last year, he has to write down his task otherwise he will forget.  He reports brain fog episodes.  His mood has moments of depression, he did report that his wife commented on him being more sarcastic than before.  He takes care of her as she is wheelchair-bound and he has to do all the activities alone.  He denies difficulty with understanding or comprehending sentences.  He does have problems with word finding, worse over the last year.  Also, he has some perception issues, "bumping into things such as door frames, especially in the left shoulder, and is working with  physical therapy on this.  He attributes these to peripheral neuropathy and chronic pain, and when in a "zombie state, he drops things ".  He does his own bills, although he may forget to pay some of these bills.  He drives without getting lost.  He denies any recent falls, except in 2019 as he was attempting to wheel his wife down a ramp, without loss of consciousness, but he does acknowledge feeling dazed and confused.  He also had another concussion in 2002.  He did sustain a right parafalcine subdural hematoma with the amount significant mass effect or midline shift.  Another fall in 2021 resulted in small volume intraventricular hemorrhage that had resolved on repeat imaging.  He denies a history of stroke, but had 2 TIAs in May 2019.  He denies any history of seizures, or toxin exposure.  He denies frequent headaches or migraines.  He denies frequent instances of dizziness or vertigo. ?Patient uses hearing aids for hearing loss, and a few years prior to that he had lost his sense of taste and smell "way before COVID in 2020 and I had to be in the hospital for like a week".  He sleeps well, gets up at different hours, he needs melatonin 10 mg to sleep well.  In the past, he drank a significant amount of alcohol, but he quit 6 months ago, denies withdrawal symptoms.  He uses a CPAP at night, with an O2 concentrator, which helps him feel more rested  in the morning.  When he sleeps, he denies having vivid dreams, hallucinations or paranoia.  He may had some acting outdreams, a lot of them sexual.  For his mood, he tried psychotherapy in the past, which was "ineffective, not doing therapy, I am a therapist myself, and I feel much better ". Family History AD mother. Master's degree in Theology from [employer] in [place], [place].. Daughter in law a local GI MD, son a local Internal Medicine MD ?  ?  ?Pertinent imaging ?Brain MRI 09/04/21  No evidence of acute intracranial abnormality.Minimal chronic  small vessel ischemic changes within the cerebral white matter, stable from the prior MRI of 12/23/2016.Mild-to-moderate generalized cerebral atrophy, slightly progressed. New from the prior MRI, there

## 2021-10-19 ENCOUNTER — Other Ambulatory Visit: Payer: Self-pay | Admitting: Internal Medicine

## 2021-10-19 ENCOUNTER — Other Ambulatory Visit: Payer: Self-pay | Admitting: Cardiology

## 2021-10-19 DIAGNOSIS — I480 Paroxysmal atrial fibrillation: Secondary | ICD-10-CM

## 2021-10-22 ENCOUNTER — Telehealth: Payer: PPO

## 2021-10-22 NOTE — Progress Notes (Deleted)
Chronic Care Management Pharmacy Note  10/22/2021 Name:  Jonathan [name] MRN:  [id number] DOB:  1933-04-28  Summary: -Pt has been off solifenacin for ~3 months and reports urinary issues are about the same. He has f/u with urology specialist next month -Pt has been taking Rybelsus 7 mg, receiving through pt assistance -Freestyle Libre readings is often 10-20 points lower than finger stick; Counseled on interstitial glucose vs blood glucose  Recommendations/Changes made from today's visit: -Discontinue solifenacin -Updated Rybelsus dose to reflect what patient is taking -Advised pt to contact [employer] customer service for free replacement sensors   Subjective: Jonathan [name] is an 88 y.o. year old male who is a primary patient of Burns, Claudina Lick, MD.  The CCM team was consulted for assistance with disease management and care coordination needs.    Engaged with patient by telephone for follow up visit in response to provider referral for pharmacy case management and/or care coordination services.   Consent to Services:  The patient was given information about Chronic Care Management services, agreed to services, and gave verbal consent prior to initiation of services.  Please see initial visit note for detailed documentation.   Patient Care Team: Binnie Rail, MD as PCP - General (Internal Medicine) Stanford Breed Denice Bors, MD as PCP - Cardiology (Cardiology) Council Mechanic, MD as Referring Physician (Family Medicine) Earnie Larsson, MD as Consulting Physician (Neurosurgery) Stanford Breed Denice Bors, MD as Consulting Physician (Cardiology) Michael Boston, MD as Consulting Physician (General Surgery) Karen Kays, NP as Nurse Practitioner (Nurse Practitioner) [name], [facility] as Pharmacist (Pharmacist)  Recent office visits: 07/25/2021 - Dr. Quay Burow - fall 4 days ago  - xray ordered - start telmisartan 56m daily  07/09/2021 - Dr. BQuay Burow- no changes to medications - f/u in 4  months   Recent consult visits: 10/17/2021 - SSharene Butters PA-C- Neurology - no changes to medications - repeat MRI of brain  10/06/2021 - Dr. DLorenso Courier-Gertie Fey- encouraged daily fiber use, drinking 8 cups of water daily, miralax daily - f/u in 3 months  08/25/2021 - CSande Rives- Cardiology - increase telmisartan to 433mdaily - f/u in 6 months 08/07/2021 - Dr. DrVianne Bulls Gertie Fey start daily fiber supplement  07/31/2021 - Dr. JoMartinique Cardiology - chest pain - lexiscan myoview ordered - no changes to medications  07/30/2021 - SaSharene ButtersA-C - neurology - repeat neurocognitive testing - MRI of brain - f/u in 6 months  07/16/2021 - MiChristen BameP - Cardiology - no changes to medications - f/u in 3 months  07/15/2021 - Dr. ShBenay Spice Oncology - thrombocytopenia - no changes to medications - f/u in 4 months   Hospital visits: 07/10/2021 - [facility]   Objective:  Lab Results  Component Value Date   CREATININE 0.91 09/08/2021   BUN 15 09/08/2021   GFR 75.75 02/18/2021   GFRNONAA >60 03/14/2020   GFRAA >60 03/14/2020   NA 143 09/08/2021   K 4.3 09/08/2021   CALCIUM 8.9 09/08/2021   CO2 25 09/08/2021    Lab Results  Component Value Date/Time   HGBA1C 5.8 07/09/2021 09:10 AM   HGBA1C 6.6 (H) 02/18/2021 10:39 AM   GFR 75.75 02/18/2021 10:39 AM   GFR 54.59 (L) 12/20/2020 11:50 AM   MICROALBUR 51.6 (H) 07/09/2021 09:10 AM   MICROALBUR 98.2 11/08/2020 04:24 PM    Last diabetic Eye exam:  Lab Results  Component Value Date/Time   HMDIABEYEEXA No Retinopathy 02/01/2019  12:00 AM    Last diabetic Foot exam: No results found for: HMDIABFOOTEX   Lab Results  Component Value Date   CHOL 100 07/09/2021   HDL 39.20 07/09/2021   LDLCALC 36 07/09/2021   LDLDIRECT 51.0 08/14/2019   TRIG 126.0 07/09/2021   CHOLHDL 3 07/09/2021       Latest Ref Rng & Units 07/30/2021    3:19 PM 07/01/2021   12:42 PM 02/18/2021   10:39 AM  Hepatic Function  Total Protein 6.0 - 8.5 g/dL 6.4   6.2   6.0     Albumin 3.6 - 4.6 g/dL 4.7   4.5   3.9    AST 0 - 40 IU/L 21   16   18     ALT 0 - 44 IU/L 37   16   18    Alk Phosphatase 44 - 121 IU/L 130   97   90    Total Bilirubin 0.0 - 1.2 mg/dL 0.3   0.3   0.3      Lab Results  Component Value Date/Time   TSH 4.200 07/30/2021 03:19 PM   TSH 4.46 07/09/2021 09:10 AM       Latest Ref Rng & Units 07/15/2021    1:05 PM 07/09/2021    9:10 AM 07/01/2021   12:42 PM  CBC  WBC 4.0 - 10.5 K/uL 7.4   8.4   6.8    Hemoglobin 13.0 - 17.0 g/dL 13.8   14.2   13.8    Hematocrit 39.0 - 52.0 % 43.8   44.6   42.1    Platelets 150 - 400 K/uL 97   74.0   96      Lab Results  Component Value Date/Time   VD25OH 58 11/08/2020 04:24 PM   VD25OH 90.58 06/12/2020 11:31 AM   VD25OH 41 11/23/2012 10:52 AM    Clinical ASCVD: Yes  The ASCVD Risk score (Arnett DK, et al., 2019) failed to calculate for the following reasons:   The 2019 ASCVD risk score is only valid for ages 77 to 46       07/09/2021   12:16 PM 04/07/2021    1:05 PM 11/08/2020    4:16 PM  Depression screen PHQ 2/9  Decreased Interest 1 0 0  Down, Depressed, Hopeless 1 0 1  PHQ - 2 Score 2 0 1  Altered sleeping 2 2 3   Tired, decreased energy 1 1 3   Change in appetite 0 0 0  Feeling bad or failure about yourself  0 0 0  Trouble concentrating 1 1 0  Moving slowly or fidgety/restless 0 0 0  Suicidal thoughts 0 0 0  PHQ-9 Score 6 4 7   Difficult doing work/chores Somewhat difficult Not difficult at all Somewhat difficult     CHA2DS2-VASc Score =    The patient's score is based upon:       Social History   Tobacco Use  Smoking Status Former   Packs/day: 2.00   Years: 10.00   Pack years: 20.00   Types: Cigarettes   Quit date: 07/18/1975   Years since quitting: 46.2  Smokeless Tobacco Never   BP Readings from Last 3 Encounters:  10/17/21 (!) 153/74  10/06/21 130/60  08/25/21 138/72   Pulse Readings from Last 3 Encounters:  10/17/21 60  10/06/21 (!) 52  08/25/21 (!) 59   Wt  Readings from Last 3 Encounters:  10/17/21 184 lb (83.5 kg)  10/06/21 178 lb 2 oz (80.8 kg)  08/25/21 173 lb (78.5 kg)   BMI Readings from Last 3 Encounters:  10/17/21 29.25 kg/m  10/06/21 28.32 kg/m  08/25/21 27.92 kg/m   Assessment/Interventions: Review of patient past medical history, allergies, medications, health status, including review of consultants reports, laboratory and other test data, was performed as part of comprehensive evaluation and provision of chronic care management services.   SDOH:  (Social Determinants of Health) assessments and interventions performed: Yes  SDOH Screenings   Alcohol Screen: Not on file  Depression (PHQ2-9): Medium Risk   PHQ-2 Score: 6  Financial Resource Strain: Not on file  Food Insecurity: Not on file  Housing: Not on file  Physical Activity: Not on file  Social Connections: Not on file  Stress: Not on file  Tobacco Use: Medium Risk   Smoking Tobacco Use: Former   Smokeless Tobacco Use: Never   Passive Exposure: Not on file  Transportation Needs: Not on file    [place]  Allergies  Allergen Reactions   Other Other (See Comments)    DUST-Other reaction(s): Respiratory distress    Medications Reviewed Today     Reviewed by Martinique, Patti E, CMA (Certified Medical Assistant) on 10/06/21 at 1124  Med List Status: <None>   Medication Order Taking? Sig Documenting Provider Last Dose Status Informant  acetaminophen (TYLENOL) 650 MG CR tablet [id number] Yes Take 1,300 mg by mouth in the morning and at bedtime. [provider] Taking Active Self  acyclovir ointment (ZOVIRAX) 5 % [id number] Yes Apply 1 application topically as needed. [provider] Taking Active   allopurinol (ZYLOPRIM) 300 MG tablet [id number] Yes Take 1 tablet (300 mg total) by mouth every evening. For gout Binnie Rail, MD Taking Active   amiodarone (PACERONE) 200 MG tablet [id number] Yes Take 200 mg by mouth daily. [provider]  Taking Active   apixaban (ELIQUIS) 5 MG TABS tablet [id number] Yes Take 1 tablet (5 mg total) by mouth 2 (two) times daily. Lelon Perla, MD Taking Active   atorvastatin (LIPITOR) 20 MG tablet [id number] Yes Take 1 tablet (20 mg total) by mouth daily. For cholesterol Binnie Rail, MD Taking Active Self  Calcium Citrate-Vitamin D (CALCIUM + D PO) [id number] Yes Take 1 tablet by mouth daily. [provider] Taking Active Self  carboxymethylcellulose (REFRESH PLUS) 0.5 % SOLN [id number] Yes Place 1 drop into both eyes 3 (three) times daily as needed (dry eyes). [provider] Taking Active Self  clobetasol (TEMOVATE) 0.05 % external solution [id number] Yes Apply 1 application topically 2 (two) times daily as needed (rash). [provider] Taking Active Self  clotrimazole-betamethasone Donalynn Furlong) cream [id number] Yes Apply sparingly topical twice daily [provider] Taking Active   Continuous Blood Gluc Receiver (FREESTYLE LIBRE 14 DAY READER) [place] [id number] Yes UAD to check sugars.  E11.65 Binnie Rail, MD Taking Active Self  Continuous Blood Gluc Sensor (FREESTYLE LIBRE 14 DAY SENSOR) [place] [id number] Yes UAD to check sugars, E11.65 Binnie Rail, MD Taking Active Self  dapagliflozin propanediol (FARXIGA) 10 MG TABS tablet [id number] Yes Take 1 tablet (10 mg total) by mouth daily before breakfast. For diabetes Burns, Claudina Lick, MD Taking Active   diltiazem (CARDIZEM CD) 120 MG 24 hr capsule [id number] Yes Take 1 capsule (120 mg total) by mouth daily. Lelon Perla, MD Taking Active   diltiazem (CARDIZEM) 30 MG tablet [id number] Yes Take 1 tablet (30 mg total) by mouth 2 (two) times daily as needed (sustained elevated heart rates >  100 bpm for > 20 minutes.). Darreld Mclean, PA-C Taking Active Self  Emollient (RA RENEWAL DARK SPOT CORRECTOR EX) [id number] Yes Apply 1 application topically daily as needed (dark spots). [provider] Taking Active Self   FLUoxetine (PROZAC) 20 MG tablet [id number] Yes Take 30 mg by mouth daily. [provider] Taking Active   FLUoxetine (PROZAC) 40 MG capsule [id number] Yes Take 1 capsule (40 mg total) by mouth daily. For depression Binnie Rail, MD Taking Active Self  Fluticasone Propionate (ALLERGY RELIEF NA) [id number] Yes Place 2 sprays into the nose daily as needed (allergies). [provider] Taking Active Self  glucose blood test strip [id number] Yes Use to check blood sugar daily. E11.9 Binnie Rail, MD Taking Active Self  hydrochlorothiazide (HYDRODIURIL) 12.5 MG tablet [id number] Yes Take 1 tablet (12.5 mg total) by mouth daily. Binnie Rail, MD Taking Active Self  Lancets [place] [id number] Yes Use to check blood sugars daily. E11.9 Binnie Rail, MD Taking Active Self  latanoprost (XALATAN) 0.005 % ophthalmic solution [id number] Yes Place 1 drop into both eyes at bedtime. [provider] Taking Active Self  levothyroxine (SYNTHROID) 112 MCG tablet [id number] Yes TAKE 1 TABLET BY MOUTH EVERY MORNING BEFORE BREAKFAST Binnie Rail, MD Taking Active   Melatonin 5 MG TABS [id number] Yes Take 5 mg by mouth at bedtime. [provider] Taking Active Self  mometasone (NASONEX) 50 MCG/ACT nasal spray [id number] Yes Place 2 sprays into the nose as needed (allergies). Binnie Rail, MD Taking Active Self  Multiple Vitamin (MULTIVITAMIN) tablet [id number] Yes Take 1 tablet by mouth daily. [provider] Taking Active Self  nitroGLYCERIN (NITROSTAT) 0.4 MG SL tablet [id number] Yes Place 1 tablet (0.4 mg total) under the tongue every 5 (five) minutes as needed for chest pain. Martinique, Peter M, MD Taking Active   omeprazole (PRILOSEC) 20 MG capsule [id number] Yes TAKE ONE CAPSULE BY MOUTH DAILY Burns, Claudina Lick, MD Taking Active Self  oxyCODONE-acetaminophen (PERCOCET) 10-325 MG tablet [id number] Yes Take 1 tablet by mouth every 8 (eight) hours as needed for pain. [provider] Taking Active Self  pregabalin (LYRICA) 75 MG capsule [id number] Yes Take 1 capsule (75 mg total) by mouth 2 (two) times daily. Binnie Rail, MD Taking Active   Probiotic Product (PROBIOTIC PO) [id number] Yes Take 1 capsule by mouth daily. [provider] Taking Active Self  RESTASIS 0.05 % ophthalmic emulsion [id number] Yes Place 1 drop into both eyes 2 (two) times daily.  [provider] Taking Active Self  Semaglutide (RYBELSUS) 7 MG TABS [id number] Yes Take 7 mg by mouth daily. [employer] pt assistance Binnie Rail, MD Taking Active Self  sildenafil (REVATIO) 20 MG tablet [id number] Yes Take 40-100 mg by mouth daily as needed. [provider] Taking Active Self  silodosin (RAPAFLO) 8 MG CAPS capsule [id number] Yes Take 8 mg by mouth daily. [provider] Taking Active Self  sulfamethoxazole-trimethoprim (BACTRIM DS) 800-160 MG tablet [id number] Yes Take 1 tablet by mouth in the morning and at bedtime. [provider] Taking Active   telmisartan (MICARDIS) 40 MG tablet [id number] Yes Take 1 tablet (40 mg total) by mouth daily. Darreld Mclean, PA-C Taking Active   Vibegron [facility]) 75 MG TABS [id number] Yes Take 75 mg by mouth daily. [provider] Taking Active Self  vitamin C (ASCORBIC ACID) 500 MG tablet [id number] Yes Take 500 mg by mouth daily. [provider] Taking Active Self  VITAMIN D, CHOLECALCIFEROL, PO [id number] Yes Take 1 capsule by mouth daily. [provider] Taking Active Self            Patient Active Problem List   Diagnosis Date Noted   Confusion 07/25/2021   Rib pain on left side 07/25/2021   Aortic atherosclerosis ([place]) 02/04/2021   Cognitive deficits 01/04/2021   Fatty liver 12/29/2020   Thrombocytopenia 12/20/2020   Arthritis of hand 11/08/2020   Generalized anxiety disorder 06/12/2020   History of recurrent falls 03/25/2020   History of SDH 03/21/2020   Rotator cuff arthropathy of  left shoulder 07/21/2019   Dupuytren's disease of palm 07/12/2019   Sebaceous cyst 06/27/2019   History of COVID-19 05/08/2019   Obstructive sleep apnea    Asymmetrical left sensorineural hearing loss 07/12/2018   Chronic anticoagulation 12/21/2017   Degeneration of thoracic intervertebral disc 09/21/2017   Thoracic back pain 08/10/2017   Degenerative joint disease of hand 07/14/2017   Osteoarthritis    Fibromyalgia    Fatty tumor    BPH (benign prostatic hyperplasia)    Insomnia 03/10/2017   Chronic pain, legs and back 03/10/2017   Hyperlipidemia 03/10/2017   Obesity (BMI 30.0-34.9) 12/08/2016   Depression 09/07/2016   Spondylolisthesis 09/07/2016   Hypothyroidism 03/08/2016   GERD (gastroesophageal reflux disease) 03/08/2016   Persistent atrial fibrillation ([place]) 03/08/2016   Glaucoma 03/08/2016   Type 2 diabetes mellitus 03/08/2016   Hereditary and idiopathic peripheral neuropathy 08/28/2015   History of TIA 08/28/2015   Essential hypertension 12/04/2011   Allergic rhinitis, seasonal 12/04/2011   COPD (chronic obstructive pulmonary disease)  07/18/2011    Immunization History  Administered Date(s) Administered   Fluad Quad(high Dose 65+) 03/25/2020, 04/07/2021   Hepatitis A 07/25/2007, 01/22/2008   Hepatitis B 04/30/1988, 06/02/1988, 10/28/1988   IPV 05/15/1996   Influenza Split 02/19/2012, 03/13/2016   Influenza Whole 02/17/2011, 03/13/2013   Influenza, High Dose Seasonal PF 03/10/2017, 02/15/2018, 02/09/2019   Influenza,inj,Quad PF,6+ Mos 03/15/2015   Influenza,inj,quad, With Preservative 02/14/2019   Meningococcal Polysaccharide 05/15/1996   Moderna Covid-19 Vaccine Bivalent Booster 76yr & up 02/25/2021, 10/06/2021   Moderna Sars-Covid-2 Vaccination 07/15/2019, 08/12/2019, 02/15/2020   PNEUMOCOCCAL CONJUGATE-20 07/09/2021   Pneumococcal Conjugate-13 12/08/2016   Pneumococcal-Unspecified 02/01/2006   Td 01/14/1995   Tdap 03/06/2018   Zoster Recombinat  (Shingrix) 01/15/2017, 01/04/2018   Zoster, Live 02/01/2006    Conditions to be addressed/monitored:  Hypertension, Hyperlipidemia, Diabetes, Atrial Fibrillation, Anxiety, Overactive Bladder, and BPH, Fibromyalgia  There are no care plans that you recently modified to display for this patient.   Care Gaps: Eye exam  Patient's preferred pharmacy is:  [id number] [place]: [phone]: [phone] Uses pill box? Yes Pt endorses 90% compliance  Care Plan and Follow Up Patient Decision:  Patient agrees to Care Plan and Follow-up.  Plan: Telephone follow up appointment with care management team member scheduled for:  3 months  ***

## 2021-10-26 ENCOUNTER — Ambulatory Visit
Admission: RE | Admit: 2021-10-26 | Discharge: 2021-10-26 | Disposition: A | Discharge status: Home / Self Care | Payer: PPO | Source: Ambulatory Visit | Attending: Physician Assistant | Admitting: Physician Assistant

## 2021-10-26 DIAGNOSIS — S0990XA Unspecified injury of head, initial encounter: Secondary | ICD-10-CM | POA: Diagnosis not present

## 2021-10-26 DIAGNOSIS — Z8673 Personal history of transient ischemic attack (TIA), and cerebral infarction without residual deficits: Secondary | ICD-10-CM | POA: Diagnosis not present

## 2021-10-26 DIAGNOSIS — I6502 Occlusion and stenosis of left vertebral artery: Secondary | ICD-10-CM | POA: Diagnosis not present

## 2021-10-26 DIAGNOSIS — R413 Other amnesia: Secondary | ICD-10-CM | POA: Diagnosis not present

## 2021-10-31 ENCOUNTER — Telehealth: Payer: Self-pay | Admitting: Physician Assistant

## 2021-10-31 DIAGNOSIS — R3915 Urgency of urination: Secondary | ICD-10-CM | POA: Diagnosis not present

## 2021-10-31 DIAGNOSIS — R351 Nocturia: Secondary | ICD-10-CM | POA: Diagnosis not present

## 2021-10-31 NOTE — Progress Notes (Signed)
HPI: FU atrial fibrillation. Previously with a fib but converted spontaneously to sinus rhythm. Patient also with significant COPD. Had fall September 2019 resulting in subdural hematoma. Again admitted 9/21 after falling and had subdural. Abdominal ultrasound July 2022 showed no aneurysm. Echo 11/22 showed normal LV function, mild to moderate MR. Nuclear study 2/23 showed EF 54 and no ischemia. Monitor 3/23 showed sinus bradycardia, normal sinus rhythm, occasional PAC, brief PAT, rare PVC; question episode of Mobitz 1 second-degree AV block but significant artifact limits interpretation; no prolonged pauses. MRA 4/23 with patent carotid and vertebral arteries. Since I last saw him, patient has had 3 separate syncopal episodes in the past 4 months.  The first episode was when he was walking.  He lost his balance and then had transient loss of consciousness for 1 to 2 seconds.  No preceding nausea, dyspnea, chest pain or palpitations.  He subsequently had an episode in his car where he was transiently unconscious.  Finally he had 1 when he was weed eating 1 day and he hit his head with some bleeding.  He otherwise denies increased dyspnea on exertion, exertional chest pain or pedal edema.  Current Outpatient Medications  Medication Sig Dispense Refill   acetaminophen (TYLENOL) 650 MG CR tablet Take 1,300 mg by mouth in the morning and at bedtime.     acyclovir ointment (ZOVIRAX) 5 % Apply 1 application topically as needed.     allopurinol (ZYLOPRIM) 300 MG tablet Take 1 tablet (300 mg total) by mouth every evening. For gout 90 tablet 3   amiodarone (PACERONE) 200 MG tablet Take 200 mg by mouth daily.     apixaban (ELIQUIS) 5 MG TABS tablet Take 1 tablet (5 mg total) by mouth 2 (two) times daily. 180 tablet 1   atorvastatin (LIPITOR) 20 MG tablet Take 1 tablet (20 mg total) by mouth daily. For cholesterol 90 tablet 1   Calcium Citrate-Vitamin D (CALCIUM + D PO) Take 1 tablet by mouth daily.      carboxymethylcellulose (REFRESH PLUS) 0.5 % SOLN Place 1 drop into both eyes 3 (three) times daily as needed (dry eyes).     clobetasol (TEMOVATE) 0.05 % external solution Apply 1 application topically 2 (two) times daily as needed (rash).     clotrimazole-betamethasone (LOTRISONE) cream Apply sparingly topical twice daily     Continuous Blood Gluc Receiver (FREESTYLE LIBRE 14 DAY READER) DEVI UAD to check sugars.  E11.65 1 each 0   Continuous Blood Gluc Sensor (FREESTYLE LIBRE 14 DAY SENSOR) MISC UAD to check sugars, E11.65 2 each 5   dapagliflozin propanediol (FARXIGA) 10 MG TABS tablet Take 1 tablet (10 mg total) by mouth daily before breakfast. For diabetes 90 tablet 2   diltiazem (CARDIZEM CD) 120 MG 24 hr capsule TAKE 1 CAPSULE BY MOUTH EVERY DAY 90 capsule 1   diltiazem (CARDIZEM) 30 MG tablet Take 1 tablet (30 mg total) by mouth 2 (two) times daily as needed (sustained elevated heart rates >100 bpm for > 20 minutes.). 30 tablet 2   Emollient (RA RENEWAL DARK SPOT CORRECTOR EX) Apply 1 application topically daily as needed (dark spots).     FLUoxetine (PROZAC) 20 MG capsule Take by mouth.     Fluticasone Propionate (ALLERGY RELIEF NA) Place 2 sprays into the nose daily as needed (allergies).     glucose blood test strip Use to check blood sugar daily. E11.9 100 each 3   hydrochlorothiazide (HYDRODIURIL) 12.5 MG tablet Take 1  tablet (12.5 mg total) by mouth daily. 90 tablet 3   Lancets MISC Use to check blood sugars daily. E11.9 100 each 3   latanoprost (XALATAN) 0.005 % ophthalmic solution Place 1 drop into both eyes at bedtime.     levothyroxine (SYNTHROID) 112 MCG tablet TAKE 1 TABLET BY MOUTH EVERY MORNING BEFORE BREAKFAST 90 tablet 1   Melatonin 5 MG TABS Take 5 mg by mouth at bedtime.     mometasone (NASONEX) 50 MCG/ACT nasal spray Place 2 sprays into the nose as needed (allergies). 17 g 5   Multiple Vitamin (MULTIVITAMIN) tablet Take 1 tablet by mouth daily.     nitroGLYCERIN  (NITROSTAT) 0.4 MG SL tablet Place 1 tablet (0.4 mg total) under the tongue every 5 (five) minutes as needed for chest pain. 90 tablet 3   omeprazole (PRILOSEC) 20 MG capsule TAKE 1 CAPSULE BY MOUTH EVERY DAY 90 capsule 1   oxyCODONE-acetaminophen (PERCOCET) 10-325 MG tablet Take 1 tablet by mouth every 8 (eight) hours as needed for pain.     pregabalin (LYRICA) 75 MG capsule Take 1 capsule (75 mg total) by mouth 2 (two) times daily. 180 capsule 0   Probiotic Product (PROBIOTIC PO) Take 1 capsule by mouth daily.     RESTASIS 0.05 % ophthalmic emulsion Place 1 drop into both eyes 2 (two) times daily.      Semaglutide (RYBELSUS) 7 MG TABS Take 7 mg by mouth daily. Via [employer] pt assistance 30 tablet 3   sildenafil (REVATIO) 20 MG tablet Take 40-100 mg by mouth daily as needed.     silodosin (RAPAFLO) 8 MG CAPS capsule Take 8 mg by mouth daily.     telmisartan (MICARDIS) 40 MG tablet Take 1 tablet (40 mg total) by mouth daily. 90 tablet 3   Vibegron (GEMTESA) 75 MG TABS Take 75 mg by mouth daily.     vitamin C (ASCORBIC ACID) 500 MG tablet Take 500 mg by mouth daily.     VITAMIN D, CHOLECALCIFEROL, PO Take 1 capsule by mouth daily.     No current facility-administered medications for this visit.     Past Medical History:  Diagnosis Date   Acute encephalopathy 05/08/2019   Allergic rhinitis, seasonal 12/04/2011   Arthritis of hand 11/08/2020   BPH (benign prostatic hyperplasia)    Cataract    Chronic anticoagulation 12/21/2017   CHADS VASC=4, Eliquis stopped Sept 2021- recurrent falls with SDH   Chronic pain, legs and back 03/10/2017   Preferred pain management Leg and back pain   Compression fracture of thoracic vertebra 09/21/2017   COPD (chronic obstructive pulmonary disease)    2 liters O2 HS   Degeneration of thoracic intervertebral disc 09/21/2017   Degenerative joint disease of hand 07/14/2017   Dupuytren's disease of palm 07/12/2019   Essential hypertension 12/04/2011    Fatty tumor    waste and back   Fibromyalgia    Generalized anxiety disorder 06/12/2020   12/21 He is reporting severe insomnia and anxiety symptoms.  Options are limited.  We will try a low-dose lorazepam at night and during the day for anxiety and panic attacks.  He will stop lorazepam if problems.  He will stop drinking alcohol.  Discontinue BuSpar.  Reduce Cymbalta to 1 a day.   GERD (gastroesophageal reflux disease)    Glaucoma 03/08/2016   Gout 03/08/2016   Hereditary and idiopathic peripheral neuropathy 08/28/2015   History of COVID-19 05/08/2019   2021 Post-COVID sx's -" brain fog" Try  Lion's mane supplement and B complex with niacin for neuropathy   Hyperlipidemia 03/10/2017   Hypothyroidism    Hypoxia    Insomnia    Major depressive disorder 09/07/2016   Obstructive sleep apnea    Osteoarthritis    Osteoporosis    Persistent atrial fibrillation ([place])    on Eliquis   Recurrent falls 03/25/2020   PT. Treat neuropathy, insomnia.  Reduce Cymbalta to 1 a day.  Discontinue BuSpar.   Rotator cuff arthropathy of left shoulder 07/21/2019   SDH (subdural hematoma) ([facility]) 03/21/2020   Recurrent SDH secondary to falls at home- Sept 2019 and again 03/14/2020- Eliquis stopped.   Sebaceous cyst 06/27/2019   Spinal compression fracture seventh vertebre   Spondylolisthesis 09/07/2016   On fosamax - for about one year - Dr Alyson Ingles  10/14/17 dexa: normal dexa -- spine 2.1,   RFN -0.9,  LFN   -0.7   - no comparison on file, previous fracture      Thoracic back pain 08/10/2017   Thrombocytopenia 12/20/2020   TIA (transient ischemic attack) 08/28/2015   Type 2 diabetes mellitus 03/08/2016    Past Surgical History:  Procedure Laterality Date   APPENDECTOMY  age 44   [place] Right    early 2000s   CATARACT EXTRACTION     bilateral   CHOLECYSTECTOMY  age 89   EYE SURGERY     FOOT ARTHRODESIS Right 02/02/2013   Procedure: RIGHT HALLUX METATARSAL PHALANGEAL JOINT ARTHRODESIS ;   Surgeon: Wylene Simmer, MD;  Location: [facility];  Service: Orthopedics;  Laterality: Right;   INGUINAL HERNIA REPAIR  age 34   rt side   NASAL CONCHA BULLOSA RESECTION  age 47   PROSTATE SURGERY     SHOULDER ARTHROSCOPY W/ ROTATOR CUFF REPAIR Right    early 2000s   tonsil     VASECTOMY  age 38    Social History   Socioeconomic History   Marital status: Married    Spouse name: Not on file   Number of children: 2   Years of education: 18   Highest education level: Master's degree (e.g., MA, MS, MEng, MEd, MSW, MBA)  Occupational History   Occupation: RETIRED    Employer: RETIRED  Tobacco Use   Smoking status: Former    Packs/day: 2.00    Years: 10.00    Pack years: 20.00    Types: Cigarettes    Quit date: 07/18/1975    Years since quitting: 46.3   Smokeless tobacco: Never  Vaping Use   Vaping Use: Never used  Substance and Sexual Activity   Alcohol use: Yes    Comment: 1-2 drinks/day   Drug use: No   Sexual activity: Not Currently  Other Topics Concern   Not on file  Social History Narrative   Right handed   Master degree   Social Determinants of Health   Financial Resource Strain: Not on file  Food Insecurity: Not on file  Transportation Needs: Not on file  Physical Activity: Not on file  Stress: Not on file  Social Connections: Not on file  Intimate Partner Violence: Not on file    Family History  Problem Relation Age of Onset   Coronary artery disease Brother    Heart disease Father    Lung cancer Father    Kidney cancer Father    Prostate cancer Father    Arthritis Mother    Lung cancer Mother    Dementia Mother  Unspecified type, not Alzheimer's disease    ROS: no fevers or chills, productive cough, hemoptysis, dysphasia, odynophagia, melena, hematochezia, dysuria, hematuria, rash, seizure activity, orthopnea, PND, pedal edema, claudication. Remaining systems are negative.  Physical Exam: Well-developed well-nourished in no  acute distress.  Skin is warm and dry.  HEENT is normal.  Neck is supple.  Chest is clear to auscultation with normal expansion.  Cardiovascular exam is regular rate and rhythm.  Abdominal exam nontender or distended. No masses palpated. Extremities show no edema. neuro grossly intact  Electrocardiogram today shows sinus bradycardia at a rate of 50, first-degree AV block and prior septal infarct cannot be excluded.  A/P  1 Recurrent syncope-patient has had recurrent syncope over the past 4 months.  I am concerned that this is likely bradycardia mediated.  I will discontinue Cardizem.  We will continue amiodarone for now.  We will refer for implantable loop monitor.  Note his LV function is normal.  His previous monitor showed question of Mobitz 1.  I have instructed him not to drive for 6 months.  2 paroxysmal atrial fibrillation-patient in sinus today; continue amiodarone and apixaban.  Given history of question Mobitz 1 I decreased his Cardizem previously and I will now discontinue this medication.  I am also concerned about his recurrent falls/syncope.  I will discontinue apixaban for now and will consider resuming later.  He understands there is a higher risk of CVA off of anticoagulation but at this time I feel risk outweighs benefit.   3 hypertension-BP controlled; however I am discontinuing Cardizem.  Instead we will add amlodipine 5 mg daily and follow blood pressure.   4 hyperlipidemia-continue statin.  5 mild to moderate MR-will need fu echos in the future.   6 history of obstructive sleep apnea-continue CPAP.  Kirk Ruths, MD

## 2021-10-31 NOTE — Telephone Encounter (Signed)
Spoke with patient about the Brain MRI results (from 10/26/21). No acute changes are noted. Minimal chronic small vessel ischemic changes within the cerebral white matter, stable from the recent prior brain MRI of 09/04/2021.Mild-to-moderate cerebral atrophy. Comparatively mild cerebellar atrophy. 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 Patient was appreciative of the call.

## 2021-11-05 ENCOUNTER — Encounter: Payer: Self-pay | Admitting: Internal Medicine

## 2021-11-05 NOTE — Patient Instructions (Addendum)
     Your a1c was checked today.   Medications changes include :      Your prescription(s) have been sent to your pharmacy.    A referral was ordered for XX.     Someone from that office will call you to schedule an appointment.    Return in about 6 months (around 05/09/2022) for follow up.

## 2021-11-05 NOTE — Progress Notes (Unsigned)
Subjective:    Patient ID: Jonathan [name], male    DOB: 1932/12/07, 88 y.o.   MRN: [id number]     HPI Jonathan [name] is here for follow up of his chronic medical problems, including htn, DM, afib, hld, insomnia, fibromyalgia, peripheral neuropathy, OAB, hypothyroidism, GAD, MDD, gout  Just a1c  Medications and allergies reviewed with patient and updated if appropriate.  Current Outpatient Medications on File Prior to Visit  Medication Sig Dispense Refill   acetaminophen (TYLENOL) 650 MG CR tablet Take 1,300 mg by mouth in the morning and at bedtime.     acyclovir ointment (ZOVIRAX) 5 % Apply 1 application topically as needed.     allopurinol (ZYLOPRIM) 300 MG tablet Take 1 tablet (300 mg total) by mouth every evening. For gout 90 tablet 3   amiodarone (PACERONE) 200 MG tablet Take 200 mg by mouth daily.     apixaban (ELIQUIS) 5 MG TABS tablet Take 1 tablet (5 mg total) by mouth 2 (two) times daily. 180 tablet 1   atorvastatin (LIPITOR) 20 MG tablet Take 1 tablet (20 mg total) by mouth daily. For cholesterol 90 tablet 1   Calcium Citrate-Vitamin D (CALCIUM + D PO) Take 1 tablet by mouth daily.     carboxymethylcellulose (REFRESH PLUS) 0.5 % SOLN Place 1 drop into both eyes 3 (three) times daily as needed (dry eyes).     clobetasol (TEMOVATE) 0.05 % external solution Apply 1 application topically 2 (two) times daily as needed (rash).     clotrimazole-betamethasone (LOTRISONE) cream Apply sparingly topical twice daily     Continuous Blood Gluc Receiver (FREESTYLE LIBRE 14 DAY READER) DEVI UAD to check sugars.  E11.65 1 each 0   Continuous Blood Gluc Sensor (FREESTYLE LIBRE 14 DAY SENSOR) MISC UAD to check sugars, E11.65 2 each 5   dapagliflozin propanediol (FARXIGA) 10 MG TABS tablet Take 1 tablet (10 mg total) by mouth daily before breakfast. For diabetes 90 tablet 2   diltiazem (CARDIZEM CD) 120 MG 24 hr capsule TAKE 1 CAPSULE BY MOUTH EVERY DAY 90 capsule 1   diltiazem (CARDIZEM) 30  MG tablet Take 1 tablet (30 mg total) by mouth 2 (two) times daily as needed (sustained elevated heart rates >100 bpm for > 20 minutes.). 30 tablet 2   Emollient (RA RENEWAL DARK SPOT CORRECTOR EX) Apply 1 application topically daily as needed (dark spots).     FLUoxetine (PROZAC) 20 MG tablet TAKE 1 AND HALF TABLET BY MOUTH EVERY DAY 45 tablet 2   FLUoxetine (PROZAC) 40 MG capsule Take 1 capsule (40 mg total) by mouth daily. For depression 90 capsule 3   Fluticasone Propionate (ALLERGY RELIEF NA) Place 2 sprays into the nose daily as needed (allergies).     glucose blood test strip Use to check blood sugar daily. E11.9 100 each 3   hydrochlorothiazide (HYDRODIURIL) 12.5 MG tablet Take 1 tablet (12.5 mg total) by mouth daily. 90 tablet 3   Lancets MISC Use to check blood sugars daily. E11.9 100 each 3   latanoprost (XALATAN) 0.005 % ophthalmic solution Place 1 drop into both eyes at bedtime.     levothyroxine (SYNTHROID) 112 MCG tablet TAKE 1 TABLET BY MOUTH EVERY MORNING BEFORE BREAKFAST 90 tablet 1   Melatonin 5 MG TABS Take 5 mg by mouth at bedtime.     mometasone (NASONEX) 50 MCG/ACT nasal spray Place 2 sprays into the nose as needed (allergies). 17 g 5   Multiple Vitamin (MULTIVITAMIN) tablet  Take 1 tablet by mouth daily.     nitroGLYCERIN (NITROSTAT) 0.4 MG SL tablet Place 1 tablet (0.4 mg total) under the tongue every 5 (five) minutes as needed for chest pain. 90 tablet 3   omeprazole (PRILOSEC) 20 MG capsule TAKE 1 CAPSULE BY MOUTH EVERY DAY 90 capsule 1   oxyCODONE-acetaminophen (PERCOCET) 10-325 MG tablet Take 1 tablet by mouth every 8 (eight) hours as needed for pain.     pregabalin (LYRICA) 75 MG capsule Take 1 capsule (75 mg total) by mouth 2 (two) times daily. 180 capsule 0   Probiotic Product (PROBIOTIC PO) Take 1 capsule by mouth daily.     RESTASIS 0.05 % ophthalmic emulsion Place 1 drop into both eyes 2 (two) times daily.      Semaglutide (RYBELSUS) 7 MG TABS Take 7 mg by mouth  daily. Via [employer] pt assistance 30 tablet 3   sildenafil (REVATIO) 20 MG tablet Take 40-100 mg by mouth daily as needed.     silodosin (RAPAFLO) 8 MG CAPS capsule Take 8 mg by mouth daily.     sulfamethoxazole-trimethoprim (BACTRIM DS) 800-160 MG tablet Take 1 tablet by mouth in the morning and at bedtime.     telmisartan (MICARDIS) 40 MG tablet Take 1 tablet (40 mg total) by mouth daily. 90 tablet 3   Vibegron (GEMTESA) 75 MG TABS Take 75 mg by mouth daily.     vitamin C (ASCORBIC ACID) 500 MG tablet Take 500 mg by mouth daily.     VITAMIN D, CHOLECALCIFEROL, PO Take 1 capsule by mouth daily.     No current facility-administered medications on file prior to visit.     Review of Systems     Objective:  There were no vitals filed for this visit. BP Readings from Last 3 Encounters:  10/17/21 (!) 153/74  10/06/21 130/60  08/25/21 138/72   Wt Readings from Last 3 Encounters:  10/17/21 184 lb (83.5 kg)  10/06/21 178 lb 2 oz (80.8 kg)  08/25/21 173 lb (78.5 kg)   There is no height or weight on file to calculate BMI.    Physical Exam     Lab Results  Component Value Date   WBC 7.4 07/15/2021   HGB 13.8 07/15/2021   HCT 43.8 07/15/2021   PLT 97 (L) 07/15/2021   GLUCOSE 101 (H) 09/08/2021   CHOL 100 07/09/2021   TRIG 126.0 07/09/2021   HDL 39.20 07/09/2021   LDLDIRECT 51.0 08/14/2019   LDLCALC 36 07/09/2021   ALT 37 07/30/2021   AST 21 07/30/2021   NA 143 09/08/2021   K 4.3 09/08/2021   CL 99 09/08/2021   CREATININE 0.91 09/08/2021   BUN 15 09/08/2021   CO2 25 09/08/2021   TSH 4.200 07/30/2021   INR 0.9 03/14/2020   HGBA1C 5.8 07/09/2021   MICROALBUR 51.6 (H) 07/09/2021     Assessment & Plan:    See Problem List for Assessment and Plan of chronic medical problems.

## 2021-11-06 ENCOUNTER — Ambulatory Visit (INDEPENDENT_AMBULATORY_CARE_PROVIDER_SITE_OTHER): Payer: PPO | Admitting: Internal Medicine

## 2021-11-06 VITALS — BP 142/70 | HR 55 | Temp 98.0°F | Ht 66.5 in | Wt 177.0 lb

## 2021-11-06 DIAGNOSIS — E1142 Type 2 diabetes mellitus with diabetic polyneuropathy: Secondary | ICD-10-CM

## 2021-11-06 DIAGNOSIS — F411 Generalized anxiety disorder: Secondary | ICD-10-CM

## 2021-11-06 DIAGNOSIS — I10 Essential (primary) hypertension: Secondary | ICD-10-CM | POA: Diagnosis not present

## 2021-11-06 DIAGNOSIS — E782 Mixed hyperlipidemia: Secondary | ICD-10-CM

## 2021-11-06 DIAGNOSIS — I4819 Other persistent atrial fibrillation: Secondary | ICD-10-CM

## 2021-11-06 DIAGNOSIS — I700 Atherosclerosis of aorta: Secondary | ICD-10-CM

## 2021-11-06 DIAGNOSIS — F3289 Other specified depressive episodes: Secondary | ICD-10-CM

## 2021-11-06 DIAGNOSIS — M797 Fibromyalgia: Secondary | ICD-10-CM | POA: Diagnosis not present

## 2021-11-06 DIAGNOSIS — E039 Hypothyroidism, unspecified: Secondary | ICD-10-CM

## 2021-11-06 DIAGNOSIS — G4700 Insomnia, unspecified: Secondary | ICD-10-CM

## 2021-11-06 DIAGNOSIS — G609 Hereditary and idiopathic neuropathy, unspecified: Secondary | ICD-10-CM | POA: Diagnosis not present

## 2021-11-06 LAB — POCT GLYCOSYLATED HEMOGLOBIN (HGB A1C)
HbA1c POC (<> result, manual entry): 5.7 % (ref 4.0–5.6)
HbA1c, POC (controlled diabetic range): 5.7 % (ref 0.0–7.0)
HbA1c, POC (prediabetic range): 5.7 % (ref 5.7–6.4)
Hemoglobin A1C: 5.7 % — AB (ref 4.0–5.6)

## 2021-11-06 NOTE — Assessment & Plan Note (Signed)
Chronic  Clinically euthyroid Currently taking levothyroxine 112 mcg daily Check tsh  Titrate med dose if needed

## 2021-11-06 NOTE — Assessment & Plan Note (Signed)
Chronic  Lab Results  Component Value Date   HGBA1C 5.8 07/09/2021   Sugars well controlled Testing sugars 1 times a day Check A1c Continue Rybelsus 14 mg daily, Farxiga 10 mg daily Stressed regular exercise, diabetic diet

## 2021-11-06 NOTE — Assessment & Plan Note (Signed)
Chronic Fairly controlled Continue Lyrica 75 mg twice daily

## 2021-11-06 NOTE — Assessment & Plan Note (Addendum)
Chronic Controlled, Stable Decreased prozac accidentally to 20 mg from 30 mg Continue fluoxetine 20 mg daily since he is doing well

## 2021-11-06 NOTE — Assessment & Plan Note (Signed)
Chronic Continue atorvastatin 20 mg daily Continue healthy diet Encourage regular exercise

## 2021-11-06 NOTE — Assessment & Plan Note (Signed)
Chronic Controlled Continue Lyrica 75 mg twice daily 

## 2021-11-06 NOTE — Assessment & Plan Note (Signed)
Chronic Blood pressure well controlled CMP Continue diltiazem 300 mg daily, HCTZ 12.5 mg daily, telmisartan 40 mg daily

## 2021-11-06 NOTE — Assessment & Plan Note (Signed)
Chronic Following with cardiology Diltiazem 120 mg daily, Eliquis 5 mg twice daily Diltiazem 30 mg twice daily as needed

## 2021-11-06 NOTE — Assessment & Plan Note (Signed)
Chronic °Regular exercise and healthy diet encouraged °Check lipid panel  °Continue atorvastatin 20 mg daily °

## 2021-11-06 NOTE — Assessment & Plan Note (Signed)
Chronic Controlled, stable Continue prozac 20 mg daily,

## 2021-11-07 ENCOUNTER — Encounter: Payer: Self-pay | Admitting: Cardiology

## 2021-11-07 ENCOUNTER — Ambulatory Visit: Payer: PPO | Admitting: Cardiology

## 2021-11-07 VITALS — BP 140/62 | HR 53 | Ht 66.0 in | Wt 177.6 lb

## 2021-11-07 DIAGNOSIS — I340 Nonrheumatic mitral (valve) insufficiency: Secondary | ICD-10-CM | POA: Diagnosis not present

## 2021-11-07 DIAGNOSIS — I10 Essential (primary) hypertension: Secondary | ICD-10-CM | POA: Diagnosis not present

## 2021-11-07 DIAGNOSIS — R55 Syncope and collapse: Secondary | ICD-10-CM

## 2021-11-07 DIAGNOSIS — I480 Paroxysmal atrial fibrillation: Secondary | ICD-10-CM

## 2021-11-07 DIAGNOSIS — E785 Hyperlipidemia, unspecified: Secondary | ICD-10-CM

## 2021-11-07 MED ORDER — AMLODIPINE BESYLATE 5 MG PO TABS
5.0000 mg | ORAL_TABLET | Freq: Every day | ORAL | 3 refills | Status: DC
Start: 2021-11-07 — End: 2022-07-03

## 2021-11-07 NOTE — Patient Instructions (Addendum)
Medication Instructions:  STOP Eliquis hold onto the bottle for possible restart  STOP Cardizem  START Amlodipine 5 mg daily  *If you need a refill on your cardiac medications before your next appointment, please call your pharmacy*  Lab Work: NONE ordered at this time of appointment   If you have labs (blood work) drawn today and your tests are completely normal, you will receive your results only by: [place] (if you have MyChart) OR A paper copy in the mail If you have any lab test that is abnormal or we need to change your treatment, we will call you to review the results.  Testing/Procedures: You have been referred to Cardiac Electrophysiology   Follow-Up: At [facility], you and your health needs are our priority.  As part of our continuing mission to provide you with exceptional heart care, we have created designated Provider Care Teams.  These Care Teams include your primary Cardiologist (physician) and Advanced Practice Providers (APPs -  Physician Assistants and Nurse Practitioners) who all work together to provide you with the care you need, when you need it.   Your next appointment:   3 month(s)  The format for your next appointment:   In Person  Provider:   Kirk Ruths, MD     Other Instructions   Important Information About Sugar

## 2021-11-11 ENCOUNTER — Telehealth: Payer: Self-pay | Admitting: Oncology

## 2021-11-11 ENCOUNTER — Other Ambulatory Visit: Payer: Self-pay

## 2021-11-11 ENCOUNTER — Inpatient Hospital Stay: Payer: PPO | Attending: Oncology

## 2021-11-11 ENCOUNTER — Inpatient Hospital Stay: Payer: PPO | Admitting: Oncology

## 2021-11-11 DIAGNOSIS — D696 Thrombocytopenia, unspecified: Secondary | ICD-10-CM

## 2021-11-11 LAB — CBC WITH DIFFERENTIAL (CANCER CENTER ONLY)
Abs Immature Granulocytes: 0.29 10*3/uL — ABNORMAL HIGH (ref 0.00–0.07)
Basophils Absolute: 0 10*3/uL (ref 0.0–0.1)
Basophils Relative: 1 %
Eosinophils Absolute: 0 10*3/uL (ref 0.0–0.5)
Eosinophils Relative: 0 %
HCT: 39.5 % (ref 39.0–52.0)
Hemoglobin: 12.2 g/dL — ABNORMAL LOW (ref 13.0–17.0)
Immature Granulocytes: 4 %
Lymphocytes Relative: 21 %
Lymphs Abs: 1.4 10*3/uL (ref 0.7–4.0)
MCH: 29.3 pg (ref 26.0–34.0)
MCHC: 30.9 g/dL (ref 30.0–36.0)
MCV: 95 fL (ref 80.0–100.0)
Monocytes Absolute: 2.3 10*3/uL — ABNORMAL HIGH (ref 0.1–1.0)
Monocytes Relative: 35 %
Neutro Abs: 2.6 10*3/uL (ref 1.7–7.7)
Neutrophils Relative %: 39 %
Platelet Count: 91 10*3/uL — ABNORMAL LOW (ref 150–400)
RBC: 4.16 MIL/uL — ABNORMAL LOW (ref 4.22–5.81)
RDW: 15.5 % (ref 11.5–15.5)
WBC Count: 6.6 10*3/uL (ref 4.0–10.5)
nRBC: 0 % (ref 0.0–0.2)

## 2021-11-11 NOTE — Telephone Encounter (Signed)
Attempted to contact patient to reschedule today's appointments but no answer, lvm for patient to cal [place] back

## 2021-11-13 DIAGNOSIS — G4733 Obstructive sleep apnea (adult) (pediatric): Secondary | ICD-10-CM | POA: Diagnosis not present

## 2021-11-17 ENCOUNTER — Other Ambulatory Visit: Payer: Self-pay | Admitting: Internal Medicine

## 2021-11-24 ENCOUNTER — Encounter: Payer: Self-pay | Admitting: Cardiology

## 2021-11-24 ENCOUNTER — Ambulatory Visit: Payer: PPO | Admitting: Cardiology

## 2021-11-24 VITALS — BP 116/56 | HR 64 | Ht 66.0 in | Wt 182.6 lb

## 2021-11-24 DIAGNOSIS — R55 Syncope and collapse: Secondary | ICD-10-CM | POA: Diagnosis not present

## 2021-11-24 NOTE — Patient Instructions (Signed)
Medication Instructions:  Your physician recommends that you continue on your current medications as directed. Please refer to the Current Medication list given to you today.  *If you need a refill on your cardiac medications before your next appointment, please call your pharmacy*   Lab Work: None ordered   Testing/Procedures: Your physician has recommended an implantable loop recorder.  This was implanted today while in the office.   Follow-Up: At [facility], you and your health needs are our priority.  As part of our continuing mission to provide you with exceptional heart care, we have created designated Provider Care Teams.  These Care Teams include your primary Cardiologist (physician) and Advanced Practice Providers (APPs -  Physician Assistants and Nurse Practitioners) who all work together to provide you with the care you need, when you need it.   Your next appointment:   as  needed  The format for your next appointment:   In Person  Provider:   Allegra Lai, MD    Thank you for choosing [facility]!!   Trinidad Curet, RN [phone]  Other Instructions   Important Information About Sugar      Implantable Loop Recorder Placement  An implantable loop recorder is a small electronic device that is placed under the skin of your chest. The device records the electrical activity of your heart over a long period of time. Your health care provider can download these recordings to monitor your heart. You may need an implantable loop recorder if you have periods of abnormal heart activity (arrhythmias) or unexplained fainting (syncope). The recorder can be left in place for 1 year or longer. Tell a health care provider about: Any allergies you have. All medicines you are taking, including vitamins, herbs, eye drops, creams, and over-the-counter medicines. Any problems you or family members have had with anesthetic medicines. Any bleeding problems you  have. Any surgeries you have had. Any medical conditions you have. Whether you are pregnant or may be pregnant. What are the risks? Generally, this is a safe procedure. However, problems may occur, including: Infection. Bleeding. Allergic reactions to anesthetic medicines. Damage to nerves or blood vessels. Failure of the device to work. This could require another surgery to replace it. What happens before the procedure?  You may have a physical exam, blood tests, and imaging tests of your heart, such as a chest X-ray. Follow instructions from your health care provider about eating or drinking restrictions. Ask your health care provider about: Changing or stopping your regular medicines. This is especially important if you are taking diabetes medicines or blood thinners. Taking medicines such as aspirin and ibuprofen. These medicines can thin your blood. Do not take these medicines unless your health care provider tells you to take them. Taking over-the-counter medicines, vitamins, herbs, and supplements. Ask your health care provider how your surgical site will be marked or identified. Ask your health care provider what steps will be taken to help prevent infection. These may include: Removing hair at the surgery site. Washing skin with a germ-killing soap. Plan to have someone take you home from the hospital or clinic. Plan to have a responsible adult care for you for at least 24 hours after you leave the hospital or clinic. This is important. Do not use any products that contain nicotine or tobacco, such as cigarettes and e-cigarettes. If you need help quitting, ask your health care provider. What happens during the procedure? An IV will be inserted into one of your veins. You  may be given one or more of the following: A medicine to help you relax (sedative). A medicine to numb the area (local anesthetic). A small incision will be made on the left side of your upper chest. A pocket  will be created under your skin. The device will be placed in the pocket. The incision will be closed with stitches (sutures) or adhesive strips. A bandage (dressing) will be placed over the incision. The procedure may vary among health care providers and hospitals. What happens after the procedure? Your blood pressure, heart rate, breathing rate, and blood oxygen level will be monitored until you leave the hospital or clinic. You may be able to go home on the day of your surgery. Before you go home: Your health care provider will program your recorder. You will learn how to trigger your device with a handheld activator. You will learn how to send recordings to your health care provider. You will get an ID card for your device, and you will be told when to use it. Do not drive for 24 hours if you were given a sedative during your procedure. Summary An implantable loop recorder is a small electronic device that is placed under the skin of your chest to monitor your heart over a long period of time. The recorder can be left in place for 1 year or longer. Plan to have someone take you home from the hospital or clinic. This information is not intended to replace advice given to you by your health care provider. Make sure you discuss any questions you have with your health care provider. Document Revised: 10/01/2020 Document Reviewed: 10/01/2020 Elsevier Patient Education  [place] Removal, Care After This sheet gives you information about how to care for yourself after your procedure. Your health care provider may also give you more specific instructions. If you have problems or questions, contact your health care provider. What can I expect after the procedure? After the procedure, it is common to have: Soreness or discomfort near the incision. Some swelling or bruising near the incision.  Follow these instructions at home: Incision  care  Monitor your cardiac device site for redness, swelling, and drainage. Call the device clinic at [phone] if you experience these symptoms or fever/chills.  Keep the large square bandage on your site for 24 hours and then you may remove it yourself. Keep the steri-strips underneath in place.   You may shower after 72 hours / 3 days from your procedure with the steri-strips in place. They will usually fall off on their own, or may be removed after 10 days. Pat dry.   Do not get direct water on incision for one week  Avoid lotions, ointments, or perfumes over your incision until it is well-healed.  Please do not submerge in water until your site is completely healed.   If your wound site starts to bleed apply pressure.      If you have any questions/concerns please call the device clinic at [phone].  Activity  Return to your normal activities.  Contact a health care provider if: You have redness, swelling, or pain around your incision. You have a fever.

## 2021-11-24 NOTE — Progress Notes (Signed)
Electrophysiology Office Note   Date:  11/24/2021   ID:  Jonathan [name], DOB 08/10/1932, MRN [id number]  PCP:  Binnie Rail, MD  Cardiologist:  Stanford Breed Primary Electrophysiologist:  Merrill Villarruel Meredith Leeds, MD    Chief Complaint: syncpe   History of Present Illness: Jonathan [name] is a 88 y.o. male who is being seen today for the evaluation of syncope at the request of Stanford Breed, Denice Bors, MD. Presenting today for electrophysiology evaluation.  He has a history significant for atrial fibrillation, COPD, hypertension  He has had recurrent episodes of syncope.  He had a fall September 2019 resulting in a subdural hematoma.  He was again admitted in 2021 after a fall.  He has had 3 separate syncopal episodes in the last 4 months.  He feels that he has had transient loss of consciousness.  He wore a cardiac monitor that showed no obvious causes.  He is interested in a Linq monitor implant.  Today, he denies symptoms of palpitations, chest pain, shortness of breath, orthopnea, PND, lower extremity edema, claudication, dizziness, presyncope, syncope, bleeding, or neurologic sequela. The patient is tolerating medications without difficulties.    Past Medical History:  Diagnosis Date   Acute encephalopathy 05/08/2019   Allergic rhinitis, seasonal 12/04/2011   Arthritis of hand 11/08/2020   BPH (benign prostatic hyperplasia)    Cataract    Chronic anticoagulation 12/21/2017   CHADS VASC=4, Eliquis stopped Sept 2021- recurrent falls with SDH   Chronic pain, legs and back 03/10/2017   Preferred pain management Leg and back pain   Compression fracture of thoracic vertebra 09/21/2017   COPD (chronic obstructive pulmonary disease)    2 liters O2 HS   Degeneration of thoracic intervertebral disc 09/21/2017   Degenerative joint disease of hand 07/14/2017   Dupuytren's disease of palm 07/12/2019   Essential hypertension 12/04/2011   Fatty tumor    waste and back   Fibromyalgia     Generalized anxiety disorder 06/12/2020   12/21 He is reporting severe insomnia and anxiety symptoms.  Options are limited.  We [name] try a low-dose lorazepam at night and during the day for anxiety and panic attacks.  He [name] stop lorazepam if problems.  He [name] stop drinking alcohol.  Discontinue BuSpar.  Reduce Cymbalta to 1 a day.   GERD (gastroesophageal reflux disease)    Glaucoma 03/08/2016   Gout 03/08/2016   Hereditary and idiopathic peripheral neuropathy 08/28/2015   History of COVID-19 05/08/2019   2021 Post-COVID sx's -" brain fog" Try Lion's mane supplement and B complex with niacin for neuropathy   Hyperlipidemia 03/10/2017   Hypothyroidism    Hypoxia    Insomnia    Major depressive disorder 09/07/2016   Obstructive sleep apnea    Osteoarthritis    Osteoporosis    Persistent atrial fibrillation ([place])    on Eliquis   Recurrent falls 03/25/2020   PT. Treat neuropathy, insomnia.  Reduce Cymbalta to 1 a day.  Discontinue BuSpar.   Rotator cuff arthropathy of left shoulder 07/21/2019   SDH (subdural hematoma) (HCC) 03/21/2020   Recurrent SDH secondary to falls at home- Sept 2019 and again 03/14/2020- Eliquis stopped.   Sebaceous cyst 06/27/2019   Spinal compression fracture seventh vertebre   Spondylolisthesis 09/07/2016   On fosamax - for about one year - Dr Alyson Ingles  10/14/17 dexa: normal dexa -- spine 2.1,   RFN -0.9,  LFN   -0.7   - no comparison on file, previous fracture  Thoracic back pain 08/10/2017   Thrombocytopenia 12/20/2020   TIA (transient ischemic attack) 08/28/2015   Type 2 diabetes mellitus 03/08/2016   Past Surgical History:  Procedure Laterality Date   APPENDECTOMY  age 74   CARPAL TUNNEL RELEASE Right    early 2000s   CATARACT EXTRACTION     bilateral   CHOLECYSTECTOMY  age 34   EYE SURGERY     FOOT ARTHRODESIS Right 02/02/2013   Procedure: RIGHT HALLUX METATARSAL PHALANGEAL JOINT ARTHRODESIS ;  Surgeon: Wylene Simmer, MD;  Location: [place];  Service: Orthopedics;  Laterality: Right;   INGUINAL HERNIA REPAIR  age 78   rt side   NASAL CONCHA BULLOSA RESECTION  age 61   PROSTATE SURGERY     SHOULDER ARTHROSCOPY W/ ROTATOR CUFF REPAIR Right    early 2000s   tonsil     VASECTOMY  age 73     Current Outpatient Medications  Medication Sig Dispense Refill   acetaminophen (TYLENOL) 650 MG CR tablet Take 1,300 mg by mouth in the morning and at bedtime.     acyclovir ointment (ZOVIRAX) 5 % Apply 1 application topically as needed.     allopurinol (ZYLOPRIM) 300 MG tablet Take 1 tablet (300 mg total) by mouth every evening. For gout 90 tablet 3   amiodarone (PACERONE) 200 MG tablet Take 200 mg by mouth daily.     amLODipine (NORVASC) 5 MG tablet Take 1 tablet (5 mg total) by mouth daily. 90 tablet 3   atorvastatin (LIPITOR) 20 MG tablet Take 1 tablet (20 mg total) by mouth daily. For cholesterol 90 tablet 1   Calcium Citrate-Vitamin D (CALCIUM + D PO) Take 1 tablet by mouth daily.     carboxymethylcellulose (REFRESH PLUS) 0.5 % SOLN Place 1 drop into both eyes 3 (three) times daily as needed (dry eyes).     clobetasol (TEMOVATE) 0.05 % external solution Apply 1 application topically 2 (two) times daily as needed (rash).     clotrimazole-betamethasone (LOTRISONE) cream Apply sparingly topical twice daily     Continuous Blood Gluc Receiver (FREESTYLE LIBRE 14 DAY READER) DEVI UAD to check sugars.  E11.65 1 each 0   Continuous Blood Gluc Sensor (FREESTYLE LIBRE 14 DAY SENSOR) MISC UAD to check sugars, E11.65 2 each 5   dapagliflozin propanediol (FARXIGA) 10 MG TABS tablet Take 1 tablet (10 mg total) by mouth daily before breakfast. For diabetes 90 tablet 2   Emollient (RA RENEWAL DARK SPOT CORRECTOR EX) Apply 1 application topically daily as needed (dark spots).     FLUoxetine (PROZAC) 20 MG capsule Take by mouth.     Fluticasone Propionate (ALLERGY RELIEF NA) Place 2 sprays into the nose daily as needed (allergies).      glucose blood test strip Use to check blood sugar daily. E11.9 100 each 3   hydrochlorothiazide (HYDRODIURIL) 12.5 MG tablet Take 1 tablet (12.5 mg total) by mouth daily. 90 tablet 3   Lancets MISC Use to check blood sugars daily. E11.9 100 each 3   latanoprost (XALATAN) 0.005 % ophthalmic solution Place 1 drop into both eyes at bedtime.     levothyroxine (SYNTHROID) 112 MCG tablet TAKE 1 TABLET BY MOUTH EVERY MORNING BEFORE BREAKFAST 90 tablet 1   Melatonin 5 MG TABS Take 5 mg by mouth at bedtime.     mometasone (NASONEX) 50 MCG/ACT nasal spray Place 2 sprays into the nose as needed (allergies). 17 g 5   Multiple Vitamin (MULTIVITAMIN) tablet Take  1 tablet by mouth daily.     nitroGLYCERIN (NITROSTAT) 0.4 MG SL tablet Place 1 tablet (0.4 mg total) under the tongue every 5 (five) minutes as needed for chest pain. 90 tablet 3   omeprazole (PRILOSEC) 20 MG capsule TAKE 1 CAPSULE BY MOUTH EVERY DAY 90 capsule 1   oxyCODONE-acetaminophen (PERCOCET) 10-325 MG tablet Take 1 tablet by mouth every 8 (eight) hours as needed for pain.     pregabalin (LYRICA) 75 MG capsule TAKE 1 CAPSULE BY MOUTH TWICE A DAY 180 capsule 0   Probiotic Product (PROBIOTIC PO) Take 1 capsule by mouth daily.     RESTASIS 0.05 % ophthalmic emulsion Place 1 drop into both eyes 2 (two) times daily.      Semaglutide (RYBELSUS) 7 MG TABS Take 7 mg by mouth daily. [employer] pt assistance 30 tablet 3   sildenafil (REVATIO) 20 MG tablet Take 40-100 mg by mouth daily as needed.     silodosin (RAPAFLO) 8 MG CAPS capsule Take 8 mg by mouth daily.     telmisartan (MICARDIS) 40 MG tablet Take 1 tablet (40 mg total) by mouth daily. 90 tablet 3   Vibegron (GEMTESA) 75 MG TABS Take 75 mg by mouth daily.     vitamin C (ASCORBIC ACID) 500 MG tablet Take 500 mg by mouth daily.     VITAMIN D, CHOLECALCIFEROL, PO Take 1 capsule by mouth daily.     FLUoxetine (PROZAC) 40 MG capsule TAKE 1 CAPSULE BY MOUTH EVERY DAY FOR DEPRESSION 90 capsule 1    No current facility-administered medications for this visit.    Allergies:   Other   Social History:  The patient  reports that he quit smoking about 46 years ago. His smoking use included cigarettes. He has a 20.00 pack-year smoking history. He has never used smokeless tobacco. He reports current alcohol use. He reports that he does not use drugs.   Family History:  The patient's family history includes Arthritis in his mother; Coronary artery disease in his brother; Dementia in his mother; Heart disease in his father; Kidney cancer in his father; Lung cancer in his father and mother; Prostate cancer in his father.    ROS:  Please see the history of present illness.   Otherwise, review of systems is positive for none.   All other systems are reviewed and negative.    PHYSICAL EXAM: VS:  BP (!) 116/56 (BP Location: Left Arm, Patient Position: Sitting, Cuff Size: Normal)   Pulse 64   Ht '5\' 6"'$  (1.676 m)   Wt 182 lb 9.6 oz (82.8 kg)   SpO2 96%   BMI 29.47 kg/m  , BMI Body mass index is 29.47 kg/m. GEN: Well nourished, well developed, in no acute distress  HEENT: normal  Neck: no JVD, carotid bruits, or masses Cardiac: RRR; no murmurs, rubs, or gallops,no edema  Respiratory:  clear to auscultation bilaterally, normal work of breathing GI: soft, nontender, nondistended, + BS MS: no deformity or atrophy  Skin: warm and dry Neuro:  Strength and sensation are intact Psych: euthymic mood, full affect  EKG:  EKG is not ordered today. Personal review of the ekg ordered 11/07/21 shows sinus rhythm, rate 50  Schedule recent Labs: 07/30/2021: ALT 37; TSH 4.200 09/08/2021: BUN 15; Creatinine, Ser 0.91; Potassium 4.3; Sodium 143 11/11/2021: Hemoglobin 12.2; Platelet Count 91    Lipid Panel     Component Value Date/Time   CHOL 100 07/09/2021 0910   TRIG 126.0 07/09/2021  0910   HDL 39.20 07/09/2021 0910   CHOLHDL 3 07/09/2021 0910   VLDL 25.2 07/09/2021 0910   LDLCALC 36 07/09/2021  0910   LDLCALC 65 11/08/2020 1624   LDLDIRECT 51.0 08/14/2019 1028     Wt Readings from Last 3 Encounters:  11/24/21 182 lb 9.6 oz (82.8 kg)  11/07/21 177 lb 9.6 oz (80.6 kg)  11/06/21 177 lb (80.3 kg)      Other studies Reviewed: Additional studies/ records that were reviewed today include: TTE 05/07/21  Review of the above records today demonstrates:   1. Left ventricular ejection fraction, by estimation, is 60 to 65%. The  left ventricle has normal function. The left ventricle has no regional  wall motion abnormalities. Left ventricular diastolic parameters were  normal.   2. Right ventricular systolic function is normal. The right ventricular  size is normal.   3. The mitral valve is normal in structure. Mild to moderate mitral valve  regurgitation.   4. The aortic valve is normal in structure. Aortic valve regurgitation is  not visualized.   Cardiac monitor 09/02/2021 personally reviewed Sinus bradycardia, normal sinus rhythm, occasional PAC, brief PAT, rare PVC.  Question episode of [place] 1 second-degree AV block but significant artifact limits interpretation.  No prolonged pauses.  ASSESSMENT AND PLAN:  1.  Recurrent syncope: Wore a cardiac monitor that showed no causes for his syncope.  Unfortunately he has had multiple episodes over the last few months.  He would benefit from continued monitoring.  Due to that, we Gedalia Mcmillon plan for Linq monitor implant.  Risk and benefits of been discussed which include bleeding and infection.  He understands these risks and is agreed to the procedure.  2.  Paroxysmal atrial fibrillation: Currently holding anticoagulation due to history of syncope.  If he does have a high burden of atrial fibrillation on his Linq monitor, may need to restart this.    3.  Hypertension: Currently well controlled    Current medicines are reviewed at length with the patient today.   The patient does not have concerns regarding his medicines.  The following  changes were made today:  none  Labs/ tests ordered today include:  No orders of the defined types were placed in this encounter.    Disposition:   FU with Zailah Zagami pending link monitor results d  Signed, Breiana Stratmann Meredith Leeds, MD  11/24/2021 12:44 PM     [facility] [address] [place] [address] [phone] (office) [phone] (fax)  SURGEON:  Allegra Lai, MD     PREPROCEDURE DIAGNOSIS:  Syncope    POSTPROCEDURE DIAGNOSIS:  Syncope     PROCEDURES:   1. Implantable loop recorder implantation    INTRODUCTION:  Loyalty [name] is a 88 y.o. male with a history of syncope who presents today for implantable loop implantation.  The patient has had syncope without a cause identified.   he has worn telemetry previously during which he did not have arrhythmias.  There is significant concern for possible arrhythmia as the cause for the syncope. The patient therefore presents today for implantable loop implantation.     DESCRIPTION OF PROCEDURE:  Informed written consent was obtained, and the patient was brought to the electrophysiology lab in a fasting state.  The patient required no sedation for the procedure today.  Mapping over the patient's chest was performed by the EP lab staff to identify the area where electrograms were most prominent for ILR recording.  This area was  found to be the left parasternal region over the 3rd-4th intercostal space. The patients left chest was therefore prepped and draped in the usual sterile fashion by the EP lab staff. The skin overlying the left parasternal region was infiltrated with lidocaine for local analgesia.  A 0.5-cm incision was made over the left parasternal region over the 3rd intercostal space.  A subcutaneous ILR pocket was fashioned using a combination of sharp and blunt dissection.  A Medtronic Reveal Linq model [place] [id number] implantable loop recorder was then placed into the pocket  R waves were very  prominent and measured 0.38m.  Steri- Strips and a sterile dressing were then applied.  There were no early apparent complications.     CONCLUSIONS:   1. Successful implantation of a Medtronic Reveal LINQ implantable loop recorder for syncope  2. No early apparent complications.   Michelle Wnek MMeredith Leeds MD 11/24/2021 12:44 PM

## 2021-12-01 DIAGNOSIS — M25512 Pain in left shoulder: Secondary | ICD-10-CM | POA: Diagnosis not present

## 2021-12-03 DIAGNOSIS — E039 Hypothyroidism, unspecified: Secondary | ICD-10-CM | POA: Diagnosis not present

## 2021-12-03 DIAGNOSIS — E1139 Type 2 diabetes mellitus with other diabetic ophthalmic complication: Secondary | ICD-10-CM | POA: Diagnosis not present

## 2021-12-03 DIAGNOSIS — J449 Chronic obstructive pulmonary disease, unspecified: Secondary | ICD-10-CM | POA: Diagnosis not present

## 2021-12-03 DIAGNOSIS — I208 Other forms of angina pectoris: Secondary | ICD-10-CM | POA: Diagnosis not present

## 2021-12-03 DIAGNOSIS — E1169 Type 2 diabetes mellitus with other specified complication: Secondary | ICD-10-CM | POA: Diagnosis not present

## 2021-12-03 DIAGNOSIS — E1142 Type 2 diabetes mellitus with diabetic polyneuropathy: Secondary | ICD-10-CM | POA: Diagnosis not present

## 2021-12-03 DIAGNOSIS — E663 Overweight: Secondary | ICD-10-CM | POA: Diagnosis not present

## 2021-12-03 DIAGNOSIS — E785 Hyperlipidemia, unspecified: Secondary | ICD-10-CM | POA: Diagnosis not present

## 2021-12-03 DIAGNOSIS — F3341 Major depressive disorder, recurrent, in partial remission: Secondary | ICD-10-CM | POA: Diagnosis not present

## 2021-12-03 DIAGNOSIS — D6869 Other thrombophilia: Secondary | ICD-10-CM | POA: Diagnosis not present

## 2021-12-03 DIAGNOSIS — I4891 Unspecified atrial fibrillation: Secondary | ICD-10-CM | POA: Diagnosis not present

## 2021-12-03 DIAGNOSIS — F419 Anxiety disorder, unspecified: Secondary | ICD-10-CM | POA: Diagnosis not present

## 2021-12-20 ENCOUNTER — Encounter: Payer: Self-pay | Admitting: Internal Medicine

## 2021-12-23 ENCOUNTER — Telehealth: Payer: PPO

## 2021-12-23 ENCOUNTER — Telehealth: Payer: Self-pay

## 2021-12-23 NOTE — Telephone Encounter (Signed)
Spoke with patient and info has been provided via my-chart of info I need to process new application.

## 2021-12-23 NOTE — Telephone Encounter (Signed)
[employer] patient assistance today.  Patient's script expired on 06/14/21 and last shipment went out on 06/16/21.

## 2021-12-26 ENCOUNTER — Telehealth: Payer: Self-pay | Admitting: Internal Medicine

## 2021-12-26 NOTE — Telephone Encounter (Signed)
Patient coming by to complete paperwork section for patients.

## 2021-12-26 NOTE — Telephone Encounter (Signed)
PT visits today with forms for [facility]. Forms have been left in an envelope in Dr.Burns mailbox.

## 2021-12-26 NOTE — Telephone Encounter (Signed)
Pt requesting a call about the Rebelsys

## 2021-12-29 ENCOUNTER — Ambulatory Visit (INDEPENDENT_AMBULATORY_CARE_PROVIDER_SITE_OTHER): Payer: PPO

## 2021-12-29 DIAGNOSIS — R55 Syncope and collapse: Secondary | ICD-10-CM

## 2021-12-29 LAB — CUP PACEART REMOTE DEVICE CHECK
Date Time Interrogation Session: 20230716182111
Implantable Pulse Generator Implant Date: 20230612

## 2022-01-08 ENCOUNTER — Ambulatory Visit: Payer: PPO | Admitting: Cardiology

## 2022-01-13 NOTE — Progress Notes (Signed)
HPI: FU atrial fibrillation. Previously with a fib but converted spontaneously to sinus rhythm. Patient also with significant COPD. Had fall September 2019 resulting in subdural hematoma. Again admitted 9/21 after falling and had subdural. Abdominal ultrasound July 2022 showed no aneurysm. Echo 11/22 showed normal LV function, mild to moderate MR. Nuclear study 2/23 showed EF 54 and no ischemia. Monitor 3/23 showed sinus bradycardia, normal sinus rhythm, occasional PAC, brief PAT, rare PVC; question episode of [place] 1 second-degree AV block but significant artifact limits interpretation; no prolonged pauses. MRA 4/23 with patent carotid and vertebral arteries.  Patient noted to have 3 syncopal episodes at time of last office visit.  Patient saw Dr. Curt Bears and implantable loop monitor placed.  Since I last saw him, he has had no recurrent syncopal episodes.  He denies dyspnea, chest pain or palpitations.  Some fatigue.  Current Outpatient Medications  Medication Sig Dispense Refill   acetaminophen (TYLENOL) 650 MG CR tablet Take 1,300 mg by mouth in the morning and at bedtime.     acyclovir ointment (ZOVIRAX) 5 % Apply 1 application topically as needed.     allopurinol (ZYLOPRIM) 300 MG tablet Take 1 tablet (300 mg total) by mouth every evening. For gout 90 tablet 3   amiodarone (PACERONE) 200 MG tablet Take 200 mg by mouth daily.     amLODipine (NORVASC) 5 MG tablet Take 1 tablet (5 mg total) by mouth daily. 90 tablet 3   atorvastatin (LIPITOR) 20 MG tablet TAKE 1 TABLET BY MOUTH DAILY. FOR CHOLESTEROL 90 tablet 1   Calcium Citrate-Vitamin D (CALCIUM + D PO) Take 1 tablet by mouth daily.     carboxymethylcellulose (REFRESH PLUS) 0.5 % SOLN Place 1 drop into both eyes 3 (three) times daily as needed (dry eyes).     clobetasol (TEMOVATE) 0.05 % external solution Apply 1 application topically 2 (two) times daily as needed (rash).     clotrimazole-betamethasone (LOTRISONE) cream Apply sparingly  topical twice daily     Continuous Blood Gluc Receiver (FREESTYLE LIBRE 14 DAY READER) DEVI UAD to check sugars.  E11.65 1 each 0   Continuous Blood Gluc Sensor (FREESTYLE LIBRE 14 DAY SENSOR) MISC UAD to check sugars, E11.65 2 each 5   dapagliflozin propanediol (FARXIGA) 10 MG TABS tablet Take 1 tablet (10 mg total) by mouth daily before breakfast. For diabetes 90 tablet 2   Emollient (RA RENEWAL DARK SPOT CORRECTOR EX) Apply 1 application topically daily as needed (dark spots).     FLUoxetine (PROZAC) 20 MG capsule TAKE 1 CAPSULE EVERY DAY ALONG WITH '10MG'$  TO EQUAL '30MG'$  TOTAL 30 capsule 2   FLUoxetine (PROZAC) 40 MG capsule TAKE 1 CAPSULE BY MOUTH EVERY DAY FOR DEPRESSION 90 capsule 1   Fluticasone Propionate (ALLERGY RELIEF NA) Place 2 sprays into the nose daily as needed (allergies).     glucose blood test strip Use to check blood sugar daily. E11.9 100 each 3   hydrochlorothiazide (HYDRODIURIL) 12.5 MG tablet Take 1 tablet (12.5 mg total) by mouth daily. 90 tablet 3   Lancets MISC Use to check blood sugars daily. E11.9 100 each 3   latanoprost (XALATAN) 0.005 % ophthalmic solution Place 1 drop into both eyes at bedtime.     levothyroxine (SYNTHROID) 112 MCG tablet TAKE 1 TABLET BY MOUTH EVERY MORNING BEFORE BREAKFAST 90 tablet 1   Melatonin 5 MG TABS Take 5 mg by mouth at bedtime.     mometasone (NASONEX) 50 MCG/ACT nasal spray  Place 2 sprays into the nose as needed (allergies). 17 g 5   Multiple Vitamin (MULTIVITAMIN) tablet Take 1 tablet by mouth daily.     nitroGLYCERIN (NITROSTAT) 0.4 MG SL tablet Place 1 tablet (0.4 mg total) under the tongue every 5 (five) minutes as needed for chest pain. 90 tablet 3   omeprazole (PRILOSEC) 20 MG capsule TAKE 1 CAPSULE BY MOUTH EVERY DAY 90 capsule 1   oxyCODONE-acetaminophen (PERCOCET) 10-325 MG tablet Take 1 tablet by mouth every 8 (eight) hours as needed for pain.     pregabalin (LYRICA) 75 MG capsule TAKE 1 CAPSULE BY MOUTH TWICE A DAY 180 capsule 0    Probiotic Product (PROBIOTIC PO) Take 1 capsule by mouth daily.     RESTASIS 0.05 % ophthalmic emulsion Place 1 drop into both eyes 2 (two) times daily.      Semaglutide (RYBELSUS) 7 MG TABS Take 7 mg by mouth daily. [employer] pt assistance 30 tablet 3   sildenafil (REVATIO) 20 MG tablet Take 40-100 mg by mouth daily as needed.     silodosin (RAPAFLO) 8 MG CAPS capsule Take 8 mg by mouth daily.     telmisartan (MICARDIS) 40 MG tablet Take 1 tablet (40 mg total) by mouth daily. 90 tablet 3   Vibegron (GEMTESA) 75 MG TABS Take 75 mg by mouth daily.     vitamin C (ASCORBIC ACID) 500 MG tablet Take 500 mg by mouth daily.     VITAMIN D, CHOLECALCIFEROL, PO Take 1 capsule by mouth daily.     No current facility-administered medications for this visit.     Past Medical History:  Diagnosis Date   Acute encephalopathy 05/08/2019   Allergic rhinitis, seasonal 12/04/2011   Arthritis of hand 11/08/2020   BPH (benign prostatic hyperplasia)    Cataract    Chronic anticoagulation 12/21/2017   CHADS VASC=4, Eliquis stopped Sept 2021- recurrent falls with SDH   Chronic pain, legs and back 03/10/2017   Preferred pain management Leg and back pain   Compression fracture of thoracic vertebra 09/21/2017   COPD (chronic obstructive pulmonary disease)    2 liters O2 HS   Degeneration of thoracic intervertebral disc 09/21/2017   Degenerative joint disease of hand 07/14/2017   Dupuytren's disease of palm 07/12/2019   Essential hypertension 12/04/2011   Fatty tumor    waste and back   Fibromyalgia    Generalized anxiety disorder 06/12/2020   12/21 He is reporting severe insomnia and anxiety symptoms.  Options are limited.  We will try a low-dose lorazepam at night and during the day for anxiety and panic attacks.  He will stop lorazepam if problems.  He will stop drinking alcohol.  Discontinue BuSpar.  Reduce Cymbalta to 1 a day.   GERD (gastroesophageal reflux disease)    Glaucoma 03/08/2016    Gout 03/08/2016   Hereditary and idiopathic peripheral neuropathy 08/28/2015   History of COVID-19 05/08/2019   2021 Post-COVID sx's -" brain fog" Try Lion's mane supplement and B complex with niacin for neuropathy   Hyperlipidemia 03/10/2017   Hypothyroidism    Hypoxia    Insomnia    Major depressive disorder 09/07/2016   Obstructive sleep apnea    Osteoarthritis    Osteoporosis    Persistent atrial fibrillation ([place])    on Eliquis   Recurrent falls 03/25/2020   PT. Treat neuropathy, insomnia.  Reduce Cymbalta to 1 a day.  Discontinue BuSpar.   Rotator cuff arthropathy of left shoulder 07/21/2019  SDH (subdural hematoma) ([place]) 03/21/2020   Recurrent SDH secondary to falls at home- Sept 2019 and again 03/14/2020- Eliquis stopped.   Sebaceous cyst 06/27/2019   Spinal compression fracture seventh vertebre   Spondylolisthesis 09/07/2016   On fosamax - for about one year - Dr Alyson Ingles  10/14/17 dexa: normal dexa -- spine 2.1,   RFN -0.9,  LFN   -0.7   - no comparison on file, previous fracture      Thoracic back pain 08/10/2017   Thrombocytopenia 12/20/2020   TIA (transient ischemic attack) 08/28/2015   Type 2 diabetes mellitus 03/08/2016    Past Surgical History:  Procedure Laterality Date   APPENDECTOMY  age 15   [place] Right    early 2000s   CATARACT EXTRACTION     bilateral   CHOLECYSTECTOMY  age 68   EYE SURGERY     FOOT ARTHRODESIS Right 02/02/2013   Procedure: RIGHT HALLUX METATARSAL PHALANGEAL JOINT ARTHRODESIS ;  Surgeon: Wylene Simmer, MD;  Location: [facility];  Service: Orthopedics;  Laterality: Right;   INGUINAL HERNIA REPAIR  age 6   rt side   NASAL CONCHA BULLOSA RESECTION  age 37   PROSTATE SURGERY     SHOULDER ARTHROSCOPY W/ ROTATOR CUFF REPAIR Right    early 2000s   tonsil     VASECTOMY  age 77    Social History   Socioeconomic History   Marital status: Married    Spouse name: Not on file   Number of children: 2   Years  of education: 18   Highest education level: Master's degree (e.g., MA, MS, MEng, MEd, MSW, MBA)  Occupational History   Occupation: RETIRED    Employer: RETIRED  Tobacco Use   Smoking status: Former    Packs/day: 2.00    Years: 10.00    Total pack years: 20.00    Types: Cigarettes    Quit date: 07/18/1975    Years since quitting: 46.5   Smokeless tobacco: Never  Vaping Use   Vaping Use: Never used  Substance and Sexual Activity   Alcohol use: Yes    Comment: 1-2 drinks/day   Drug use: No   Sexual activity: Not Currently  Other Topics Concern   Not on file  Social History Narrative   Right handed   Master degree   Social Determinants of Health   Financial Resource Strain: Low Risk  (04/26/2020)   Overall Financial Resource Strain (CARDIA)    Difficulty of Paying Living Expenses: Not hard at all  Recent Concern: Financial Resource Strain - Medium Risk (03/12/2020)   Overall Financial Resource Strain (CARDIA)    Difficulty of Paying Living Expenses: Somewhat hard  Food Insecurity: No Food Insecurity (04/26/2020)   Hunger Vital Sign    Worried About Running Out of Food in the Last Year: Never true    [place] in the Last Year: Never true  Transportation Needs: No Transportation Needs (04/26/2020)   PRAPARE - Hydrologist (Medical): No    Lack of Transportation (Non-Medical): No  Physical Activity: Inactive (04/26/2020)   Exercise Vital Sign    Days of Exercise per Week: 0 days    Minutes of Exercise per Session: 0 min  Stress: No Stress Concern Present (04/26/2020)   [place]    Feeling of Stress : Not at all  Social Connections: Socially Integrated (04/26/2020)   Social Connection and Isolation  Panel [NHANES]    Frequency of Communication with Friends and Family: More than three times a week    Frequency of Social Gatherings with Friends and Family: Once a week     Attends Religious Services: 1 to 4 times per year    Active Member of [employer] or Organizations: Yes    Attends Archivist Meetings: 1 to 4 times per year    Marital Status: Married  Human resources officer Violence: Not At Risk (04/20/2018)   Humiliation, Afraid, Rape, and Kick questionnaire    Fear of Current or Ex-Partner: No    Emotionally Abused: No    Physically Abused: No    Sexually Abused: No    Family History  Problem Relation Age of Onset   Coronary artery disease Brother    Heart disease Father    Lung cancer Father    Kidney cancer Father    Prostate cancer Father    Arthritis Mother    Lung cancer Mother    Dementia Mother        Unspecified type, not Alzheimer's disease    ROS: no fevers or chills, productive cough, hemoptysis, dysphasia, odynophagia, melena, hematochezia, dysuria, hematuria, rash, seizure activity, orthopnea, PND, pedal edema, claudication. Remaining systems are negative.  Physical Exam: Well-developed well-nourished in no acute distress.  Skin is warm and dry.  HEENT is normal.  Neck is supple.  Chest is clear to auscultation with normal expansion.  Cardiovascular exam is regular rate and rhythm.  Abdominal exam nontender or distended. No masses palpated. Extremities show no edema. neuro grossly intact  ECG- personally reviewed  A/P  1 recurrent syncope-patient was seen by Dr. Curt Bears and now has an implantable loop monitor in place.  No recurrences since last office visit.  Continue to follow.  Note LV function is normal.  I also explained that he should not drive for 6 months following last syncopal episode.  2 paroxysmal atrial fibrillation-continue amiodarone.  Check chest x-ray.  He remains in sinus rhythm on examination.  Apixaban was held previously due to recurrent syncopal episodes (I felt risk outweighs benefit).  3 hypertension-blood pressure controlled.  Continue present medications and follow-up.  4 hyperlipidemia-continue  statin.  5 mitral regurgitation-mild to moderate on most recent echocardiogram.  He will need follow-up studies in the future.  6 obstructive sleep apnea-continue CPAP.  Kirk Ruths, MD

## 2022-01-15 ENCOUNTER — Other Ambulatory Visit: Payer: Self-pay | Admitting: Internal Medicine

## 2022-01-24 ENCOUNTER — Other Ambulatory Visit: Payer: Self-pay | Admitting: Internal Medicine

## 2022-01-27 ENCOUNTER — Encounter: Payer: Self-pay | Admitting: Cardiology

## 2022-01-27 ENCOUNTER — Ambulatory Visit
Admission: RE | Admit: 2022-01-27 | Discharge: 2022-01-27 | Disposition: A | Discharge status: Home / Self Care | Payer: PPO | Source: Ambulatory Visit | Attending: Cardiology | Admitting: Cardiology

## 2022-01-27 ENCOUNTER — Ambulatory Visit: Payer: PPO | Admitting: Cardiology

## 2022-01-27 ENCOUNTER — Encounter: Payer: Self-pay | Admitting: Psychology

## 2022-01-27 VITALS — BP 136/62 | HR 69 | Ht 66.0 in | Wt 188.4 lb

## 2022-01-27 DIAGNOSIS — I10 Essential (primary) hypertension: Secondary | ICD-10-CM | POA: Diagnosis not present

## 2022-01-27 DIAGNOSIS — E785 Hyperlipidemia, unspecified: Secondary | ICD-10-CM | POA: Diagnosis not present

## 2022-01-27 DIAGNOSIS — I700 Atherosclerosis of aorta: Secondary | ICD-10-CM | POA: Diagnosis not present

## 2022-01-27 DIAGNOSIS — R55 Syncope and collapse: Secondary | ICD-10-CM

## 2022-01-27 DIAGNOSIS — I340 Nonrheumatic mitral (valve) insufficiency: Secondary | ICD-10-CM | POA: Diagnosis not present

## 2022-01-27 DIAGNOSIS — I480 Paroxysmal atrial fibrillation: Secondary | ICD-10-CM

## 2022-01-27 DIAGNOSIS — J984 Other disorders of lung: Secondary | ICD-10-CM | POA: Diagnosis not present

## 2022-01-27 NOTE — Patient Instructions (Addendum)
Medication Instructions:  No changes *If you need a refill on your cardiac medications before your next appointment, please call your pharmacy*   Lab Work: None ordered If you have labs (blood work) drawn today and your tests are completely normal, you will receive your results only by: [place] (if you have MyChart) OR A paper copy in the mail If you have any lab test that is abnormal or we need to change your treatment, we will call you to review the results.   Testing/Procedures: A chest x-ray takes a picture of the organs and structures inside the chest, including the heart, lungs, and blood vessels. This test can show several things, including, whether the heart is enlarges; whether fluid is building up in the lungs; and whether pacemaker / defibrillator leads are still in place.  This will take place at [place]: At [facility], you and your health needs are our priority.  As part of our continuing mission to provide you with exceptional heart care, we have created designated Provider Care Teams.  These Care Teams include your primary Cardiologist (physician) and Advanced Practice Providers (APPs -  Physician Assistants and Nurse Practitioners) who all work together to provide you with the care you need, when you need it.  We recommend signing up for the patient portal called "MyChart".  Sign up information is provided on this After Visit Summary.  MyChart is used to connect with patients for Virtual Visits (Telemedicine).  Patients are able to view lab/test results, encounter notes, upcoming appointments, etc.  Non-urgent messages can be sent to your provider as well.   To learn more about what you can do with MyChart, go to [email or web address].    Your next appointment:   6 month(s)  The format for your next appointment:   In Person  Provider:   Kirk Ruths, MD {

## 2022-01-30 NOTE — Progress Notes (Signed)
Carelink Summary Report / Loop Recorder 

## 2022-02-02 ENCOUNTER — Ambulatory Visit (INDEPENDENT_AMBULATORY_CARE_PROVIDER_SITE_OTHER): Payer: PPO

## 2022-02-02 DIAGNOSIS — R55 Syncope and collapse: Secondary | ICD-10-CM | POA: Diagnosis not present

## 2022-02-03 LAB — CUP PACEART REMOTE DEVICE CHECK
Date Time Interrogation Session: 20230818182037
Implantable Pulse Generator Implant Date: 20230612

## 2022-02-04 DIAGNOSIS — M25512 Pain in left shoulder: Secondary | ICD-10-CM | POA: Diagnosis not present

## 2022-02-05 DIAGNOSIS — N476 Balanoposthitis: Secondary | ICD-10-CM | POA: Diagnosis not present

## 2022-02-05 DIAGNOSIS — N3941 Urge incontinence: Secondary | ICD-10-CM | POA: Diagnosis not present

## 2022-02-05 DIAGNOSIS — R351 Nocturia: Secondary | ICD-10-CM | POA: Diagnosis not present

## 2022-02-10 ENCOUNTER — Encounter: Payer: Self-pay | Admitting: Internal Medicine

## 2022-02-11 ENCOUNTER — Inpatient Hospital Stay: Payer: PPO | Attending: Oncology

## 2022-02-11 ENCOUNTER — Inpatient Hospital Stay: Payer: PPO | Admitting: Oncology

## 2022-02-11 VITALS — BP 142/58 | HR 60 | Temp 98.2°F | Resp 18 | Ht 66.0 in | Wt 189.4 lb

## 2022-02-11 DIAGNOSIS — E039 Hypothyroidism, unspecified: Secondary | ICD-10-CM | POA: Insufficient documentation

## 2022-02-11 DIAGNOSIS — M5416 Radiculopathy, lumbar region: Secondary | ICD-10-CM | POA: Diagnosis not present

## 2022-02-11 DIAGNOSIS — M47896 Other spondylosis, lumbar region: Secondary | ICD-10-CM | POA: Diagnosis not present

## 2022-02-11 DIAGNOSIS — I700 Atherosclerosis of aorta: Secondary | ICD-10-CM | POA: Insufficient documentation

## 2022-02-11 DIAGNOSIS — E119 Type 2 diabetes mellitus without complications: Secondary | ICD-10-CM | POA: Diagnosis not present

## 2022-02-11 DIAGNOSIS — M797 Fibromyalgia: Secondary | ICD-10-CM | POA: Diagnosis not present

## 2022-02-11 DIAGNOSIS — D696 Thrombocytopenia, unspecified: Secondary | ICD-10-CM | POA: Insufficient documentation

## 2022-02-11 DIAGNOSIS — M47816 Spondylosis without myelopathy or radiculopathy, lumbar region: Secondary | ICD-10-CM

## 2022-02-11 DIAGNOSIS — I10 Essential (primary) hypertension: Secondary | ICD-10-CM | POA: Diagnosis not present

## 2022-02-11 HISTORY — DX: Atherosclerosis of aorta: I70.0

## 2022-02-11 HISTORY — DX: Spondylosis without myelopathy or radiculopathy, lumbar region: M47.816

## 2022-02-11 LAB — CBC WITH DIFFERENTIAL (CANCER CENTER ONLY)
Abs Immature Granulocytes: 0.22 10*3/uL — ABNORMAL HIGH (ref 0.00–0.07)
Basophils Absolute: 0 10*3/uL (ref 0.0–0.1)
Basophils Relative: 1 %
Eosinophils Absolute: 0 10*3/uL (ref 0.0–0.5)
Eosinophils Relative: 0 %
HCT: 41 % (ref 39.0–52.0)
Hemoglobin: 13.2 g/dL (ref 13.0–17.0)
Immature Granulocytes: 4 %
Lymphocytes Relative: 23 %
Lymphs Abs: 1.4 10*3/uL (ref 0.7–4.0)
MCH: 29.7 pg (ref 26.0–34.0)
MCHC: 32.2 g/dL (ref 30.0–36.0)
MCV: 92.3 fL (ref 80.0–100.0)
Monocytes Absolute: 2.4 10*3/uL — ABNORMAL HIGH (ref 0.1–1.0)
Monocytes Relative: 38 %
Neutro Abs: 2.1 10*3/uL (ref 1.7–7.7)
Neutrophils Relative %: 34 %
Platelet Count: 74 10*3/uL — ABNORMAL LOW (ref 150–400)
RBC: 4.44 MIL/uL (ref 4.22–5.81)
RDW: 14.8 % (ref 11.5–15.5)
WBC Count: 6.3 10*3/uL (ref 4.0–10.5)
nRBC: 0 % (ref 0.0–0.2)

## 2022-02-11 LAB — FERRITIN: Ferritin: 25 ng/mL (ref 24–336)

## 2022-02-11 NOTE — Progress Notes (Signed)
  [facility] OFFICE PROGRESS NOTE   Diagnosis: Thrombocytopenia  INTERVAL HISTORY:   Jonathan [name] returns as scheduled.  He is no longer taking anticoagulation due to several falls.  He bruises easily.  He has an occasional nosebleed.  No other bleeding.  Objective:  Vital signs in last 24 hours:  Blood pressure (!) 142/58, pulse 60, temperature 98.2 F (36.8 C), temperature source Oral, resp. rate 18, height 5' 6" (1.676 m), weight 189 lb 6.4 oz (85.9 kg), SpO2 100 %.     Lymphatics: No cervical, supraclavicular, axillary, or inguinal nodes Resp: Lungs clear bilaterally Cardio: Regular rate and rhythm GI: No hepatosplenomegaly, no apparent ascites Vascular: No leg edema  Skin: Resolving ecchymosis in the rig  Lab Results:  Lab Results  Component Value Date   WBC 6.3 02/11/2022   HGB 13.2 02/11/2022   HCT 41.0 02/11/2022   MCV 92.3 02/11/2022   PLT 74 (L) 02/11/2022   NEUTROABS 2.1 02/11/2022    CMP  Lab Results  Component Value Date   NA 143 09/08/2021   K 4.3 09/08/2021   CL 99 09/08/2021   CO2 25 09/08/2021   GLUCOSE 101 (H) 09/08/2021   BUN 15 09/08/2021   CREATININE 0.91 09/08/2021   CALCIUM 8.9 09/08/2021   PROT 6.4 07/30/2021   ALBUMIN 4.7 (H) 07/30/2021   AST 21 07/30/2021   ALT 37 07/30/2021   ALKPHOS 130 (H) 07/30/2021   BILITOT 0.3 07/30/2021   GFRNONAA >60 03/14/2020   GFRAA >60 03/14/2020     Medications: I have reviewed the patient's current medications.   Assessment/Plan: Thrombocytopenia-chronic  Atrial fibrillation-apixaban discontinued May 2023 due to recurrent falls/syncope Hypertension Fibromyalgia Diabetes Hypothyroidism Anxiety Gout Neuropathy Sleep apnea-CPAP Glaucoma Mild elevation of monocyte count Small cutaneous cyst at the perineum    Disposition: Jonathan [name] has chronic thrombocytopenia.  The thrombocytopenia may be related to ITP.  The differential diagnosis includes myelodysplasia or  myeloproliferative disorder.  The plan is to continue observation for now.  He will call for spontaneous bleeding or bruising.  He will return for an office visit and CBC in 6 months.  We will recommend a bone marrow biopsy if he develops progressive thrombocytopenia or a new hematologic abnormality.  Betsy Coder, MD  02/11/2022  12:07 PM

## 2022-02-13 ENCOUNTER — Other Ambulatory Visit: Payer: Self-pay | Admitting: Internal Medicine

## 2022-02-13 ENCOUNTER — Telehealth: Payer: Self-pay

## 2022-02-13 NOTE — Telephone Encounter (Signed)
Patient gave verbal understanding and had no further questions or concerns  

## 2022-02-13 NOTE — Telephone Encounter (Signed)
-----   Message from Ladell Pier, MD sent at 02/13/2022  6:55 AM EDT ----- Please call patient, hemoglobin is normal, but iron is at low end of normal range, return for cbc and ferritin 3 months, f/u as scheduled, call for bleeding

## 2022-02-24 ENCOUNTER — Other Ambulatory Visit: Payer: Self-pay | Admitting: Internal Medicine

## 2022-02-28 ENCOUNTER — Other Ambulatory Visit: Payer: Self-pay | Admitting: Internal Medicine

## 2022-03-01 NOTE — Progress Notes (Signed)
Carelink Summary Report / Loop Recorder 

## 2022-03-09 ENCOUNTER — Ambulatory Visit (INDEPENDENT_AMBULATORY_CARE_PROVIDER_SITE_OTHER): Payer: PPO

## 2022-03-09 DIAGNOSIS — I480 Paroxysmal atrial fibrillation: Secondary | ICD-10-CM

## 2022-03-09 DIAGNOSIS — R55 Syncope and collapse: Secondary | ICD-10-CM | POA: Diagnosis not present

## 2022-03-09 LAB — CUP PACEART REMOTE DEVICE CHECK
Date Time Interrogation Session: 20230920182225
Implantable Pulse Generator Implant Date: 20230612

## 2022-03-10 ENCOUNTER — Other Ambulatory Visit: Payer: Self-pay

## 2022-03-10 ENCOUNTER — Encounter: Payer: Self-pay | Admitting: Internal Medicine

## 2022-03-10 MED ORDER — BLOOD GLUCOSE MONITOR KIT: PACK | 0 refills | Status: DC

## 2022-03-14 ENCOUNTER — Other Ambulatory Visit: Payer: Self-pay | Admitting: Internal Medicine

## 2022-03-20 ENCOUNTER — Other Ambulatory Visit: Payer: Self-pay | Admitting: Internal Medicine

## 2022-03-20 ENCOUNTER — Telehealth: Payer: Self-pay

## 2022-03-20 DIAGNOSIS — G4733 Obstructive sleep apnea (adult) (pediatric): Secondary | ICD-10-CM | POA: Diagnosis not present

## 2022-03-20 DIAGNOSIS — U071 COVID-19: Secondary | ICD-10-CM

## 2022-03-20 MED ORDER — MOLNUPIRAVIR EUA 200MG CAPSULE: 4.0000 | ORAL_CAPSULE | Freq: Two times a day (BID) | ORAL | 0 refills | Status: AC

## 2022-03-20 NOTE — Progress Notes (Signed)
Carelink Summary Report / Loop Recorder 

## 2022-03-30 ENCOUNTER — Telehealth: Payer: Self-pay | Admitting: Cardiology

## 2022-03-30 NOTE — Telephone Encounter (Signed)
Erroneous encounter

## 2022-03-30 NOTE — Telephone Encounter (Signed)
*  STAT* If patient is at the pharmacy, call can be transferred to refill team.   1. Which medications need to be refilled? (please list name of each medication and dose if known) telmisartan (MICARDIS) 20 MG tablet  2. Which pharmacy/location (including street and city if local pharmacy) is medication to be sent to? [id number] [place], [place] - [id number]  3. Do they need a 30 day or 90 day supply? 90 day  Patient is our of medication, is leaving town at noon tomorrow,  03/31/22.

## 2022-03-31 MED ORDER — TELMISARTAN 40 MG PO TABS: 40.0000 mg | ORAL_TABLET | Freq: Every day | ORAL | 3 refills | Status: DC

## 2022-04-02 ENCOUNTER — Encounter: Payer: PPO | Admitting: Psychology

## 2022-04-07 ENCOUNTER — Telehealth: Payer: Self-pay | Admitting: Internal Medicine

## 2022-04-07 NOTE — Telephone Encounter (Signed)
Patients assistance application has expired with [employer] - They are faxing a new application for Dr. Quay Burow to fill out.

## 2022-04-10 ENCOUNTER — Encounter: Payer: PPO | Admitting: Psychology

## 2022-04-10 DIAGNOSIS — M47816 Spondylosis without myelopathy or radiculopathy, lumbar region: Secondary | ICD-10-CM | POA: Diagnosis not present

## 2022-04-10 DIAGNOSIS — M47896 Other spondylosis, lumbar region: Secondary | ICD-10-CM | POA: Diagnosis not present

## 2022-04-13 ENCOUNTER — Ambulatory Visit (INDEPENDENT_AMBULATORY_CARE_PROVIDER_SITE_OTHER): Payer: PPO

## 2022-04-13 DIAGNOSIS — R55 Syncope and collapse: Secondary | ICD-10-CM

## 2022-04-13 LAB — CUP PACEART REMOTE DEVICE CHECK
Date Time Interrogation Session: 20231023191828
Implantable Pulse Generator Implant Date: 20230612

## 2022-04-14 ENCOUNTER — Telehealth: Payer: Self-pay

## 2022-04-14 NOTE — Telephone Encounter (Signed)
Received the following transmission alert from [employer]:  ILR alert for AF, 3 episodes with longest 1hr. Rates controlled. AF burden 1%. Routing per protocol. Indication: Syncope. No [place]- JJB  Flagging for ongoing monitoring.  Patient burden slight increase from .8% at last flag in September. Note more frequent, short episodes over past couple of months.  Longest 1 hour, max V Rate 136bpm.  Still considered SCAF but as frequency is increasing will flag for Dr. Curt Bears.

## 2022-04-14 NOTE — Telephone Encounter (Signed)
Paperwork completed and faxed.  °

## 2022-04-20 ENCOUNTER — Encounter: Payer: Self-pay | Admitting: Physician Assistant

## 2022-04-20 ENCOUNTER — Ambulatory Visit (INDEPENDENT_AMBULATORY_CARE_PROVIDER_SITE_OTHER): Payer: PPO | Admitting: Physician Assistant

## 2022-04-20 VITALS — BP 123/78 | HR 60 | Resp 20 | Ht 66.0 in | Wt 200.0 lb

## 2022-04-20 DIAGNOSIS — G3184 Mild cognitive impairment, so stated: Secondary | ICD-10-CM | POA: Diagnosis not present

## 2022-04-20 NOTE — Patient Instructions (Addendum)
It was a pleasure to see you today at our office!  Recommendations:  Follow up in 6  months Neuropsych testing for clarity of diagnosis and disease trajectory    Whom to call:  Memory  decline, memory medications: Call our office [phone]   For psychiatric meds, mood meds: Please have your primary care physician manage these medications.   Counseling regarding caregiver distress, including caregiver depression, anxiety and issues regarding community resources, adult day care programs, adult living facilities, or memory care questions:   Feel free to contact [place], Social Worker at [phone]   For assessment of decision of mental capacity and competency:  Call Dr. Anthoney Harada, geriatric psychiatrist at [phone]  For guidance in geriatric dementia issues please call [facility] [phone]  For guidance regarding [facility] and if placement were needed at the facility, contact Arnell Asal, Social Worker tel: [phone]  If you have any severe symptoms of a stroke, or other severe issues such as confusion,severe chills or fever, etc call 911 or go to the ER as you may need to be evaluated further   Feel free to visit Facebook page " [facility]" for tips of how to care for people with memory problems.     RECOMMENDATIONS FOR ALL PATIENTS WITH MEMORY PROBLEMS: 1. Continue to exercise (Recommend 30 minutes of walking everyday, or 3 hours every week) 2. Increase social interactions - continue going to [place] and enjoy social gatherings with friends and family 3. Eat healthy, avoid fried foods and eat more fruits and vegetables 4. Maintain adequate blood pressure, blood sugar, and blood cholesterol level. Reducing the risk of stroke and cardiovascular disease also helps promoting better memory. 5. Avoid stressful situations. Live a simple life and avoid aggravations. Organize your time and prepare for the next day in  anticipation. 6. Sleep well, avoid any interruptions of sleep and avoid any distractions in the bedroom that may interfere with adequate sleep quality 7. Avoid sugar, avoid sweets as there is a strong link between excessive sugar intake, diabetes, and cognitive impairment We discussed the Mediterranean diet, which has been shown to help patients reduce the risk of progressive memory disorders and reduces cardiovascular risk. This includes eating fish, eat fruits and green leafy vegetables, nuts like almonds and hazelnuts, walnuts, and also use olive oil. Avoid fast foods and fried foods as much as possible. Avoid sweets and sugar as sugar use has been linked to worsening of memory function.  There is always a concern of gradual progression of memory problems. If this is the case, then we may need to adjust level of care according to patient needs. Support, both to the patient and caregiver, should then be put into place.    FALL PRECAUTIONS: Be cautious when walking. Scan the area for obstacles that may increase the risk of trips and falls. When getting up in the mornings, sit up at the edge of the bed for a few minutes before getting out of bed. Consider elevating the bed at the head end to avoid drop of blood pressure when getting up. Walk always in a well-lit room (use night lights in the walls). Avoid area rugs or power cords from appliances in the middle of the walkways. Use a walker or a cane if necessary and consider physical therapy for balance exercise. Get your eyesight checked regularly.  FINANCIAL OVERSIGHT: Supervision, especially oversight when making financial decisions or transactions is also recommended.  HOME SAFETY: Consider the safety of  the kitchen when operating appliances like stoves, microwave oven, and blender. Consider having supervision and share cooking responsibilities until no longer able to participate in those. Accidents with firearms and other hazards in the house should  be identified and addressed as well.   ABILITY TO BE LEFT ALONE: If patient is unable to contact 911 operator, consider using LifeLine, or when the need is there, arrange for someone to stay with patients. Smoking is a fire hazard, consider supervision or cessation. Risk of wandering should be assessed by caregiver and if detected at any point, supervision and safe proof recommendations should be instituted.  MEDICATION SUPERVISION: Inability to self-administer medication needs to be constantly addressed. Implement a mechanism to ensure safe administration of the medications.   DRIVING: Regarding driving, in patients with progressive memory problems, driving will be impaired. We advise to have someone else do the driving if trouble finding directions or if minor accidents are reported. Independent driving assessment is available to determine safety of driving.   If you are interested in the driving assessment, you can contact the following:  [employer] in [place]  [place] [phone]  [place]  [facility] [phone] or [phone]

## 2022-04-20 NOTE — Progress Notes (Signed)
Assessment/Plan:   Mild Cognitive Impairment   Jonathan [name] is a very pleasant 88 y.o. RH male presenting today in follow-up for evaluation of memory loss. Patient is not on antidementia medications at this time.  Today's MoCA is essentially unchanged, 28/30 personally reviewed  prior MRI brain s remarkable for minimal chronic small vessel ischemic changes within the cerebral white matter and mild-to-moderate generalized cerebral atrophy, slightly progressed and known L LVA occlusion .  Patient reports subjective memory loss, giving his high degree of education, with certainly need formal diagnosis through neuropsychological evaluation scheduled for April 2024.  Patient declines any antidementia medications at this time, given that he is on polypharmacy for different medical problems.   Recommendations:   Follow up in  6 months after Neuropsych evaluation  Neuropsych evaluation for clarity of the diagnosis and disease progression Continue to control mood as per PCP Recommend good control of cardiovascular risk factors.   No indication for antidementia medication at this time Considering having clinical pharmacy looking into the medications and see what he can be adjusted in view of polypharmacy. Continue CPAP for OSA       Subjective:   This patient is here alone. Previous records as well as any outside records available were reviewed prior to todays visit.  Last seen on 10/17/21     Any changes in memory since last visit?  "Depends on the day, LTM is good ".  He denies any issues with recent conversations, but he is concerned that he cannot calculate like he used to, or spell like before.  He also reports that the other day, he went to the trip to the beach, he forgot the medications, he was 3 days without them.    repeats oneself?  Denies Disoriented when walking into a room?  Patient denies   Leaving objects in unusual places?  Patient denies   Ambulates  with difficulty?  He  admits to not ambulating enough, "maybe 1 mile a week "   .  He lives in independent living, and he admits to not participating in a lot of the activities that the facility offers Recent falls?  Patient denies, but in the recent past, he was placed on a Holter monitor, to determine if the falls may be related to some "skipped beat ". Any head injuries?  Patient denies   History of seizures?   Patient denies   Wandering behavior?  Patient denies   Patient drives?  No issues when driving  Any mood changes since last visit?  Patient denies, "I am I feel down sometimes".  "I am going to visit my daughter soon who is out of state " Any worsening depression?:  Patient denies   Hallucinations?  Patient denies   Paranoia?  Patient denies   Patient reports that sleeps well without vivid dreams, REM behavior or sleepwalking  Sleeping too much 8-10 hours History of sleep apnea?  Endorsed, uses CPAP Any hygiene concerns?  Patient denies   Independent of bathing and dressing?  Endorsed  Does the patient needs help with medications?  In charge, once in a while I may forget.  As mentioned above, he forgot his medications and he could not take them for 3 days on a beach trip Who is in charge of the finances?  Patient is in charge    Any changes in appetite?  Patient has not been too hungry over the last few years  Patient have trouble swallowing? Patient denies   Does  the patient cook?  Once in a while. He gets the meals in the dinning room   Any kitchen accidents such as leaving the stove on? Patient denies   Any headaches?  "Very seldom and they last a second or 2, and then they go away".  Double vision? Patient denies   Any focal numbness or tingling?  Patient denies   Chronic back pain Patient denies   Unilateral weakness?  Patient denies   Any tremors?  Patient denies   Any history of anosmia?  Decreased taste and smell for the last 4 years  Any incontinence of urine?  Endorsed, sometimes stream  starts before getting into the toilet. F/u with Urology  Any bowel dysfunction?  Sometimes I have a little incontinence over the last 4 yrs . Has seen a GI in the past  Patient lives  He lives at [facility]     Initial visit 07/30/21 The patient is seen in neurologic consultation at the request of Jonathan [name] for the evaluation of memory.  The patient is here alone. Jonathan [name] is a very pleasant 88 year old man with extensive medical history including type 2 diabetes, atrial fibrillation, hypertension, insomnia, hypothyroidism, hyperlipidemia, gout, chronic back pain, arthritis, GAD, OSA, and undiagnosed depression presenting here for evaluation of memory difficulties.  He had a comprehensive neurological evaluation by Jonathan [name] in 2018 with normal results, without impaired performances across testing.  At the time, his subjective cognitive difficulties were felt to be likely a combination of various medical and psychiatric conditions.  In 2020, his PCP referred him for repeat neuro testing due to subjective worsening of his memory.  He is Neuropsych evaluation on 02/15/2019 continued to suggest neuropsychological functioning largely within normal limits, perhaps with a slight decline in day-to-day cognitive abilities, included ongoing psychiatric distress, and longstanding traits of ADHD.  Neuroimaging at the time suggested chronic small vessel ischemic changes in the cerebral white matter and a history of subdural hematoma.  Also his memory was affected by medical comorbidities and medication side effects of polypharmacy. On 12/20/2020, he saw Jonathan [name] in follow-up and he expressed a desire at that time for repeat neuropsychological evaluation due to subjective cognitive decline.  Extensive testing in July 2022 yielded a diagnosis of mild cognitive impairment, of unclear neurological etiology.   He had reports in today's visit decreased short-term memory, worse remembering details  of prior conversations, forgetting to complete started task, and trouble remembering the names of familiar people. He denies any long-term memory issues.  He does report attention and concentration difficulties,never formally diagnosed with ADHD, he reports having been told he had issues with attention since adolescence.  These include trouble maintaining focus, easy distractibility, unable to finish projects, needing to reread passages several times, and instances of impulsivity.  For the last year, he has to write down his task otherwise he will forget.  He reports brain fog episodes.  His mood has moments of depression, he did report that his wife commented on him being more sarcastic than before.  He takes care of her as she is wheelchair-bound and he has to do all the activities alone.  He denies difficulty with understanding or comprehending sentences.  He does have problems with word finding, worse over the last year.  Also, he has some perception issues, "bumping into things such as door frames, especially in the left shoulder, and is working with physical therapy on this.  He attributes these to peripheral neuropathy and  chronic pain, and when in a "zombie state, he drops things ".  He does his own bills, although he may forget to pay some of these bills.  He drives without getting lost.  He denies any recent falls, except in 2019 as he was attempting to wheel his wife down a ramp, without loss of consciousness, but he does acknowledge feeling dazed and confused.  He also had another concussion in 2002.  He did sustain a right parafalcine subdural hematoma with the amount significant mass effect or midline shift.  Another fall in 2021 resulted in small volume intraventricular hemorrhage that had resolved on repeat imaging.  He denies a history of stroke, but had 2 TIAs in May 2019.  He denies any history of seizures, or toxin exposure.  He denies frequent headaches or migraines.  He denies frequent  instances of dizziness or vertigo. Patient uses hearing aids for hearing loss, and a few years prior to that he had lost his sense of taste and smell "way before COVID in 2020 and I had to be in the hospital for like a week".  He sleeps well, gets up at different hours, he needs melatonin 10 mg to sleep well.  In the past, he drank a significant amount of alcohol, but he quit 6 months ago, denies withdrawal symptoms.  He uses a CPAP at night, with an O2 concentrator, which helps him feel more rested in the morning.  When he sleeps, he denies having vivid dreams, hallucinations or paranoia.  He may had some acting outdreams, a lot of them sexual.  For his mood, he tried psychotherapy in the past, which was "ineffective, not doing therapy, I am a therapist myself, and I feel much better ". Family History AD mother. Master's degree in Theology from [employer] in [place], [place].. Daughter in law a local GI [name], son a local Internal Medicine [name]     Pertinent imaging  Brain MRI 09/04/21  No evidence of acute intracranial abnormality.Minimal chronic small vessel ischemic changes within the cerebral white matter, stable from the prior MRI of 12/23/2016.Mild-to-moderate generalized cerebral atrophy, slightly progressed. New from the prior MRI, there is signal abnormality within the intracranial left vertebral artery suggesting high-grade stenosis or vessel occlusion.    MRA head and neck  09/24/21 Negative MRA of the neck with wide patency of both carotid artery systems and vertebral arteries. 2. 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 This accounts for the signal abnormality seen on recent brain MRI. Dominant right vertebral artery remains widely patent. 3. Additional intracranial atherosclerotic disease with mild to moderate stenoses involving the distal left M1 segment, severe proximal left M2 branch stenosis, with a short-segment  mild-to-moderate stenosis at the left P1/P2 junction.       Neuropsych evaluation 01/03/21  Dr. Melvyn Novas "Briefly, results suggested significant performance variability across numerous cognitive domains including processing speed, executive functioning, confrontation naming, visuospatial abilities, and learning and memory. No consistent impairments were exhibited. Relative to his previous evaluation in September 2020, there was a large degree of stability when looking by overall domain. While variable, visuospatial abilities did appear to exhibit a mild decline, as did cognitive flexibility. However, outside of this, a consistent decline between the two evaluations cannot be discerned. Relative to most recent neuroimaging, his right-sided intraventricular hemorrhage and bilateral frontal lobe subdural hygromas sustained during a fall this past October could offer an explanation for visuospatial and cognitive flexibility decline, as  well as greater subjective dysfunction. Additional explanations for experienced day-to-day cognitive difficulties continued to include ongoing psychiatric distress (i.e., mild levels of anxiety and depression) and sleep dysfunction, longstanding traits of ADHD (despite not warranting a diagnosis), chronic pain, and neuroimaging suggesting chronic small vessel ischemic changes in the cerebral white matter and history of a remote subdural hematoma".   Pertinent labs January 2023 A1c 5.8, triglycerides 245, TSH 4.46, iron 41 with normal ferritin, saturation 9.7   Past Medical History:  Diagnosis Date   Acute encephalopathy 05/08/2019   Allergic rhinitis, seasonal 12/04/2011   Arthritis of hand 11/08/2020   BPH (benign prostatic hyperplasia)    Cataract    Chronic anticoagulation 12/21/2017   CHADS VASC=4, Eliquis stopped Sept 2021- recurrent falls with SDH   Chronic pain, legs and back 03/10/2017   Preferred pain management Leg and back pain   Compression fracture of thoracic  vertebra 09/21/2017   COPD (chronic obstructive pulmonary disease)    2 liters O2 HS   Degeneration of thoracic intervertebral disc 09/21/2017   Degenerative joint disease of hand 07/14/2017   Dupuytren's disease of palm 07/12/2019   Essential hypertension 12/04/2011   Fatty tumor    waste and back   Fibromyalgia    Generalized anxiety disorder 06/12/2020   12/21 He is reporting severe insomnia and anxiety symptoms.  Options are limited.  We will try a low-dose lorazepam at night and during the day for anxiety and panic attacks.  He will stop lorazepam if problems.  He will stop drinking alcohol.  Discontinue BuSpar.  Reduce Cymbalta to 1 a day.   GERD (gastroesophageal reflux disease)    Glaucoma 03/08/2016   Gout 03/08/2016   Hereditary and idiopathic peripheral neuropathy 08/28/2015   History of COVID-19 05/08/2019   2021 Post-COVID sx's -" brain fog" Try Lion's mane supplement and B complex with niacin for neuropathy   Hyperlipidemia 03/10/2017   Hypothyroidism    Hypoxia    Insomnia    Major depressive disorder 09/07/2016   Obstructive sleep apnea    Osteoarthritis    Osteoporosis    Persistent atrial fibrillation ([place])    on Eliquis   Recurrent falls 03/25/2020   PT. Treat neuropathy, insomnia.  Reduce Cymbalta to 1 a day.  Discontinue BuSpar.   Rotator cuff arthropathy of left shoulder 07/21/2019   SDH (subdural hematoma) ([facility]) 03/21/2020   Recurrent SDH secondary to falls at home- Sept 2019 and again 03/14/2020- Eliquis stopped.   Sebaceous cyst 06/27/2019   Spinal compression fracture seventh vertebre   Spondylolisthesis 09/07/2016   On fosamax - for about one year - Dr Alyson Ingles  10/14/17 dexa: normal dexa -- spine 2.1,   RFN -0.9,  LFN   -0.7   - no comparison on file, previous fracture      Thoracic back pain 08/10/2017   Thrombocytopenia 12/20/2020   TIA (transient ischemic attack) 08/28/2015   Type 2 diabetes mellitus 03/08/2016     Past Surgical History:   Procedure Laterality Date   APPENDECTOMY  age 25   [place] Right    early 2000s   CATARACT EXTRACTION     bilateral   CHOLECYSTECTOMY  age 38   EYE SURGERY     FOOT ARTHRODESIS Right 02/02/2013   Procedure: RIGHT HALLUX METATARSAL PHALANGEAL JOINT ARTHRODESIS ;  Surgeon: Wylene Simmer, [name];  Location: [facility];  Service: Orthopedics;  Laterality: Right;   INGUINAL HERNIA REPAIR  age 42   rt side  NASAL CONCHA BULLOSA RESECTION  age 31   PROSTATE SURGERY     SHOULDER ARTHROSCOPY W/ ROTATOR CUFF REPAIR Right    early 2000s   tonsil     VASECTOMY  age 70     PREVIOUS MEDICATIONS:   CURRENT MEDICATIONS:  Outpatient Encounter Medications as of 04/20/2022  Medication Sig   acetaminophen (TYLENOL) 650 MG CR tablet Take 1,300 mg by mouth in the morning and at bedtime.   acyclovir ointment (ZOVIRAX) 5 % Apply 1 application topically as needed.   allopurinol (ZYLOPRIM) 300 MG tablet Take 1 tablet (300 mg total) by mouth every evening. For gout   amiodarone (PACERONE) 200 MG tablet Take 200 mg by mouth daily.   amLODipine (NORVASC) 5 MG tablet Take 1 tablet (5 mg total) by mouth daily.   atorvastatin (LIPITOR) 20 MG tablet TAKE 1 TABLET BY MOUTH DAILY. FOR CHOLESTEROL   blood glucose meter kit and supplies KIT Dispense based on patient and insurance preference. Use daily as directed. (FOR E11.9).   Calcium Citrate-Vitamin D (CALCIUM + D PO) Take 1 tablet by mouth daily.   carboxymethylcellulose (REFRESH PLUS) 0.5 % SOLN Place 1 drop into both eyes 3 (three) times daily as needed (dry eyes).   clobetasol (TEMOVATE) 0.05 % external solution Apply 1 application topically 2 (two) times daily as needed (rash). (Patient not taking: Reported on 02/11/2022)   clotrimazole-betamethasone (LOTRISONE) cream Apply sparingly topical twice daily (Patient not taking: Reported on 02/11/2022)   dapagliflozin propanediol (FARXIGA) 10 MG TABS tablet Take 1 tablet (10 mg total) by  mouth daily before breakfast. For diabetes   Emollient (RA RENEWAL DARK SPOT CORRECTOR EX) Apply 1 application topically daily as needed (dark spots).   FLUoxetine (PROZAC) 20 MG capsule TAKE 1 CAPSULE EVERY DAY ALONG WITH 10MG TO EQUAL 30MG TOTAL   Fluticasone Propionate (ALLERGY RELIEF NA) Place 2 sprays into the nose daily as needed (allergies).   hydrochlorothiazide (HYDRODIURIL) 12.5 MG tablet TAKE 1 TABLET BY MOUTH EVERY DAY   Lancets MISC Use to check blood sugars daily. E11.9   latanoprost (XALATAN) 0.005 % ophthalmic solution Place 1 drop into both eyes at bedtime.   levothyroxine (SYNTHROID) 112 MCG tablet TAKE 1 TABLET BY MOUTH EVERY MORNING BEFORE BREAKFAST   Lido-Menthol-Methyl Sal-Camph (CBD KINGS EX) Apply topically as needed.   Melatonin 5 MG TABS Take 5 mg by mouth at bedtime.   mometasone (NASONEX) 50 MCG/ACT nasal spray Place 2 sprays into the nose as needed (allergies).   Multiple Vitamin (MULTIVITAMIN) tablet Take 1 tablet by mouth daily.   nitroGLYCERIN (NITROSTAT) 0.4 MG SL tablet Place 1 tablet (0.4 mg total) under the tongue every 5 (five) minutes as needed for chest pain. (Patient not taking: Reported on 02/11/2022)   omeprazole (PRILOSEC) 20 MG capsule TAKE 1 CAPSULE BY MOUTH EVERY DAY   oxyCODONE-acetaminophen (PERCOCET) 10-325 MG tablet Take 1 tablet by mouth every 8 (eight) hours as needed for pain.   pregabalin (LYRICA) 75 MG capsule TAKE 1 CAPSULE BY MOUTH TWICE A DAY   Probiotic Product (PROBIOTIC PO) Take 1 capsule by mouth daily.   RESTASIS 0.05 % ophthalmic emulsion Place 1 drop into both eyes 2 (two) times daily.    Semaglutide (RYBELSUS) 7 MG TABS Take 7 mg by mouth daily. Via [employer] pt assistance (Patient not taking: Reported on 02/11/2022)   sildenafil (REVATIO) 20 MG tablet Take 40-100 mg by mouth daily as needed.   silodosin (RAPAFLO) 8 MG CAPS capsule Take 8  mg by mouth daily.   telmisartan (MICARDIS) 40 MG tablet Take 1 tablet (40 mg total) by mouth  daily.   Vibegron (GEMTESA) 75 MG TABS Take 75 mg by mouth daily.   vitamin C (ASCORBIC ACID) 500 MG tablet Take 500 mg by mouth daily.   VITAMIN D, CHOLECALCIFEROL, PO Take 1 capsule by mouth daily.   No facility-administered encounter medications on file as of 04/20/2022.     Objective:     PHYSICAL EXAMINATION:    VITALS:   Vitals:   04/20/22 1408  BP: 123/78  Pulse: 60  Resp: 20  SpO2: 98%  Weight: 200 lb (90.7 kg)  Height: _0  (1.676 m)    GEN:  The patient appears stated age and is in NAD. HEENT:  Normocephalic, atraumatic.   Neurological examination:  General: NAD, well-groomed, appears stated age. Orientation: The patient is alert. Oriented to person, place and date Cranial nerves: There is good facial symmetry.The speech is fluent and clear. No aphasia or dysarthria. Fund of knowledge is appropriate. Recent memory impaired and remote memory is normal.  Attention and concentration are normal.  Able to name objects and repeat phrases.  Hearing is intact to conversational tone.    Sensation: Sensation is intact to light touch throughout Motor: Strength is at least antigravity x4. DTR's 2/4 in UE/LE      04/20/2022    2:00 PM 07/30/2021    8:00 AM  Montreal Cognitive Assessment   Visuospatial/ Executive (0/5) 4 4  Naming (0/3) 3 3  Attention: Read list of digits (0/2) 2 2  Attention: Read list of letters (0/1) 1 1  Attention: Serial 7 subtraction starting at 100 (0/3) 3 3  Language: Repeat phrase (0/2) 2 2  Language : Fluency (0/1) 1 1  Abstraction (0/2) 2 1  Delayed Recall (0/5) 4 4  Orientation (0/6) 6 6  Total 28 27  Adjusted Score (based on education) 28 27       04/26/2019    2:53 PM 04/20/2018   11:40 AM 04/19/2017   10:51 AM  MMSE - Mini Mental State Exam  Not completed: Refused  Refused  Orientation to time  5   Orientation to Place  5   Registration  3   Attention/ Calculation  5   Recall  2   Language- name 2 objects  2   Language-  repeat  1   Language- follow 3 step command  3   Language- read & follow direction  1   Write a sentence  1   Copy design  1   Total score  29        Movement examination: Tone: There is normal tone in the UE/LE Abnormal movements:  no tremor.  No myoclonus.  No asterixis.   Coordination:  There is no decremation with RAM's. Normal finger to nose  Gait and Station: The patient has no difficulty arising out of a deep-seated chair without the use of the hands. The patient's stride length is good may lean to the R.  Gait is cautious and narrow.   Thank you for allowing [place] the opportunity to participate in the care of this nice patient. Please do not hesitate to contact us for any questions or concerns.   Total time spent on today's visit was 43 minutes dedicated to this patient today, preparing to see patient, examining the patient, ordering tests and/or medications and counseling the patient, documenting clinical information in the EHR or other health  record, independently interpreting results and communicating results to the patient/family, discussing treatment and goals, answering patient's questions and coordinating care.  Cc:  Binnie Rail, [name]  Sharene Butters 04/20/2022 2:45 PM

## 2022-04-22 ENCOUNTER — Ambulatory Visit: Payer: PPO | Admitting: Psychology

## 2022-04-22 ENCOUNTER — Encounter: Payer: Self-pay | Admitting: Psychology

## 2022-04-22 DIAGNOSIS — S065XAA Traumatic subdural hemorrhage with loss of consciousness status unknown, initial encounter: Secondary | ICD-10-CM | POA: Diagnosis not present

## 2022-04-22 DIAGNOSIS — F330 Major depressive disorder, recurrent, mild: Secondary | ICD-10-CM | POA: Diagnosis not present

## 2022-04-22 DIAGNOSIS — I6781 Acute cerebrovascular insufficiency: Secondary | ICD-10-CM | POA: Diagnosis not present

## 2022-04-22 DIAGNOSIS — G3184 Mild cognitive impairment, so stated: Secondary | ICD-10-CM

## 2022-04-22 DIAGNOSIS — M797 Fibromyalgia: Secondary | ICD-10-CM | POA: Diagnosis not present

## 2022-04-22 DIAGNOSIS — F411 Generalized anxiety disorder: Secondary | ICD-10-CM

## 2022-04-22 DIAGNOSIS — R4189 Other symptoms and signs involving cognitive functions and awareness: Secondary | ICD-10-CM

## 2022-04-22 NOTE — Progress Notes (Signed)
   Psychometrician Note   Cognitive testing was administered to Jonathan [name] by Jonathan [name], B.S. (psychometrist) under the supervision of Jonathan [name], Ph.D., licensed psychologist on 04/22/2022. [name] did not appear overtly distressed by the testing session per behavioral observation or responses across self-report questionnaires. Rest breaks were offered.    The battery of tests administered was selected by Jonathan [name], Ph.D. with consideration to Jonathan [name] current level of functioning, the nature of his symptoms, emotional and behavioral responses during interview, level of literacy, observed level of motivation/effort, and the nature of the referral question. This battery was communicated to the psychometrist. Communication between Jonathan [name], Ph.D. and the psychometrist was ongoing throughout the evaluation and Jonathan [name], Ph.D. was immediately accessible at all times. Jonathan [name], Ph.D. provided supervision to the psychometrist on the date of this service to the extent necessary to assure the quality of all services provided.    Jonathan [name] will return within approximately 1-2 weeks for an interactive feedback session with Jonathan [name] at which time his test performances, clinical impressions, and treatment recommendations will be reviewed in detail. Jonathan [name] understands he can contact our office should he require our assistance before this time.  A total of 130 minutes of billable time were spent face-to-face with Jonathan [name] by the psychometrist. This includes both test administration and scoring time. Billing for these services is reflected in the clinical report generated by Jonathan [name], Ph.D.  This note reflects time spent with the psychometrician and does not include test scores or any clinical interpretations made by Jonathan [name]. The full report will follow in a separate note.

## 2022-04-22 NOTE — Progress Notes (Unsigned)
NEUROPSYCHOLOGICAL EVALUATION [place]. [place] Department of Neurology  Date of Evaluation: April 22, 2022  Reason for Referral:   Azaryah Oleksy is a 88 y.o. right-handed Caucasian male referred by Sharene Butters, PA-C, to characterize his current cognitive functioning and assist with diagnostic clarity and treatment planning in the context of a prior diagnosis of mild cognitive impairment and concern for progressive cognitive decline.   Assessment and Plan:   Clinical Impression(s): Mr. Basaldua pattern of performance is suggestive of a primary weakness surrounding processing speed. Performance variability was exhibited across executive functioning and, to a lesser extent, delayed retrieval aspects of verbal memory. However, as variability suggests, several performances across executive functioning and verbal memory were quite strong. Performances were appropriate relative to age-matched peers across all other assessed cognitive domains. This includes attention/concentration, receptive and expressive language, visuospatial abilities, and visual memory. Mr. Coudriet largely denied difficulties completing instrumental activities of daily living (ADLs) independently. However, he did report some trouble remembering to take medications at times and in the past, his wife has described her perception of greater functional difficulties. Overall, I feel he continues to best meet diagnostic criteria for a Mild Neurocognitive Disorder ("mild cognitive impairment"). However, the mild nature of this diagnosis should be emphasized and he is far closer to functioning within normal limits along this spectrum than a dementia designation based upon testing results.  Relative to previous testing, performances were largely stable. In fact, there were some isolated tasks that suggested improvement relative to previous testing. Despite Mr. Zagal report of significant diffuse cognitive decline  during interview, no areas across testing suggested any degree of decline.  Mr. Mally continues to not display concerning features or testing patterns for any neurodegenerative illness. This includes Alzheimer's disease, Lewy body disease, frontotemporal lobar degeneration, Parkinson's disease, or another more rare parkinsonian condition. The most likely cause for testing performances and day-to-day subjective dysfunction continues to be his myriad of complex medical and psychiatric factors. This would include numerous medical ailments, especially those affecting cardio and cerebrovascular functioning, reported longstanding traits of ADHD, mild psychiatric distress, chronic pain and fibromyalgia, sleep dysfunction, alcohol use, polypharmacy, and prior neuroimaging surrounding a subdural hematoma, high-grade stenosis, microvascular ischemic disease, and generalized atrophy.   Recommendations: Mr. Pinheiro reported consuming the equivalent of two shots of liquor on a nightly basis, in large part to help him fall asleep. It is important to realize that while this may help him in falling asleep, the quality of obtained sleep will be poorer and these actions are likely contributing to ongoing sleep dysfunction. I would also very strongly advise against his report of frequently mixing of alcohol and oxycodone due to severe health risks.     Additionally, the CDC defines heavy drinking for men as 15 or more drinks in a week. Based upon this, Mr. Gary is at and may possibly exceed this threshold if alcohol intake is higher than what he reported. Chronic heavy drinking will not only worsen various medical conditions, but also cognitive abilities and place him at an increased risk for alcohol-related dementia presentations. I would recommend that he decrease his alcohol intake and discuss alternative sleep-aids with his medical team.   Performance across neurocognitive testing is not a strong predictor of an  individual's safety operating a motor vehicle. Should his family wish to pursue a formalized driving evaluation, they could reach out to the following agencies: [facility] [employer] in [place]: [phone] Driver Rehabilitative Services: [facility]: [phone]  [facility]: [phone] or [phone]   Mr. Usman is encouraged to attend to lifestyle factors for brain health (e.g., regular physical exercise, good nutrition habits, regular participation in cognitively-stimulating activities, and general stress management techniques), which are likely to have benefits for both emotional adjustment and cognition. Optimal control of vascular risk factors (including safe cardiovascular exercise and adherence to dietary recommendations) is encouraged. Likewise, continued compliance with his CPAP machine will also be important.    When learning new information, he would benefit from information being broken up into small, manageable pieces. He may also find it helpful to articulate the material in his own words and in a context to promote encoding at the onset of a new task. This material may need to be repeated multiple times to promote encoding.   Memory can be improved using internal strategies such as rehearsal, repetition, chunking, mnemonics, association, and imagery. External strategies such as written notes in a consistently used memory journal, visual and nonverbal auditory cues such as a calendar on the refrigerator or appointments with alarm, such as on a cell phone, can also help maximize recall.     To address problems with processing speed, he may wish to consider:   -Ensuring that he is alerted when essential material or instructions are being presented   -Adjusting the speed at which new information is presented   -Allowing for more time in comprehending, processing, and responding in conversation   To address problems with fluctuating attention, he  may wish to consider:   -Avoiding external distractions when needing to concentrate   -Limiting exposure to fast paced environments with multiple sensory demands   -Writing down complicated information and using checklists   -Attempting and completing one task at a time (i.e., no multi-tasking)   -Verbalizing aloud each step of a task to maintain focus   -Taking frequent breaks during the completion of steps/tasks to avoid fatigue   -Reducing the amount of information considered at one time  Review of Records:   Mr. Marchena completed a comprehensive neuropsychological evaluation Kandis Nab, Psy.D.) on 10/22/2016. At that time, results were entirely within normal limits and commensurate with estimated premorbid intellectual abilities. No impaired performances were exhibited across testing. Subjective cognitive difficulties were said to likely be related to a combination of his various medical and psychiatric conditions.    Mr. Garabedian was seen by [facility] Billey Gosling, M.D.) on 02/09/2019 for follow-up of several medical and psychiatric conditions including type II diabetes, atrial fibrillation, hypertension, insomnia, hypothyroidism, hyperlipidemia, gout, chronic back pain, arthritis, GERD, and subjective cognitive difficulties. Regarding the latter, these were said to focus on worsening short-term memory. As such, he was referred for a comprehensive neuropsychological evaluation to characterize his cognitive abilities and to assist with diagnostic clarity and future treatment planning.   He completed a second neuropsychological evaluation with myself on 02/15/2019. Results at that time continued to suggest neuropsychological functioning largely within normal limits. However, notable performance variability was exhibited across domains of processing speed, attention/concentration, verbal fluency, and executive functioning. Performance across domains of receptive language,  confrontation naming, visuospatial functioning, and learning and memory were within normal limits. Relative to his 2018 neuropsychological evaluation, performance decline was exhibited across domains of processing speed, complex attention, and executive functioning. Likewise, performance on a list learning verbal memory task also declined; however, other memory performances were stable and well within normal limits. Possible reasons for exhibited cognitive decline and experienced day-to-day cognitive difficulties included ongoing psychiatric distress (i.e., moderate  levels of reported anxiety and mild levels of reported depression), longstanding traits of ADHD (despite not warranting a diagnosis), neuroimaging suggesting chronic small vessel ischemic changes in the cerebral white matter and history of subdural hematoma, medical comorbidities, and medication side effects/polypharmacy.    Mr. Balderson saw Dr. Quay Burow on 12/20/2020 for follow-up. He reported extreme pain stemming from fibromyalgia, peripheral neuropathy with burning/tingling sensations in his feet, insomnia, and frequent urination. He also expressed a desire for a repeat neuropsychological evaluation due to subjective cognitive decline. He described his short-term memory as"very bad," noting that he had left the stove on 3-4 times.  He completed a third neuropsychological evaluation with myself on 12/27/2020. Results suggested significant performance variability across numerous cognitive domains including processing speed, executive functioning, confrontation naming, visuospatial abilities, and learning and memory. As an example, scores across memory tasks ranged from the exceptionally low normative range (1st percentile; short delay recall during a list learning task) to the exceptionally high normative range (99th percentile; delays of both story and shape learning tasks). While this represents the widest performance gap across his evaluation (as well  as the widest possible gap in general), other gaps were regularly 60-80 percentile points. No consistent impairments were exhibited. Performances were below average across attention/concentration, while generally appropriate across receptive language and verbal fluency. Given variability, testing patterns were nonspecific in nature and may be explained by a combination of his many medical ailments, prior neuroimaging findings, psychiatric distress, and fibromyalgia. He was ultimately diagnosed with a mild neurocognitive disorder and repeat testing in 18-24 months was recommended.   Neuroimaging   Brain MRI on 03/06/2016 revealed mild generalized atrophy and mild periventricular and subcortical small vessel ischemic disease. Brain MRI on 12/23/2016 was stable relative to prior imaging. Head CT on 03/06/2018 revealed an anterior right parafalcine subdural hematoma without significant mass effect or midline shift in the context of a recent fall. Generalized cerebral volume loss with mild chronic small vessel ischemic changes in the cerebral white matter was also noted. Follow-up head CT on 04/21/2018 noted the resolution of his subdural hematoma. Head CT on 05/08/2019 again revealed mild cortical and central atrophy, as well as mild small vessel ischemic changes. Head CT on 03/14/2020 was stable.    Head CT on 03/16/2020 in the context of a fall revealed a small volume intraventricular hemorrhage in the right lateral ventricle, as well as small bilateral subdural hygromas in the frontal lobes bilaterally. Head CT on 04/01/2020 revealed interval resolution of the intraventricular hemorrhage and near complete resolution of bilateral subdural fluid collection. The small residual fluid collections in the bilateral parietal measured up to 43m. Brain MRI on 09/04/2021 revealed slightly progressive mild-to-moderate generalized cerebral atrophy. There was also a new signal abnormality in the intracranial left vertebral artery  suggesting high grade stenosis or occlusion. Brain MRI on 10/27/2021 was stable with his most recent scan.   Past Medical History:  Diagnosis Date   Abdominal aortic atherosclerosis 02/11/2022   Acute encephalopathy 05/08/2019   Allergic rhinitis, seasonal 12/04/2011   Aortic atherosclerosis 02/04/2021   Arthritis of finger of both hands 03/27/2021   BPH (benign prostatic hyperplasia)    Cataract    Chronic anticoagulation 12/21/2017   CHADS VASC=4, Eliquis stopped Sept 2021- recurrent falls with SDH   Chronic pain, legs and back 03/10/2017   Preferred pain management Leg and back pain   Compression fracture of thoracic vertebra 09/21/2017   COPD (chronic obstructive pulmonary disease)    2 liters O2 HS  Degeneration of thoracic intervertebral disc 09/21/2017   Degenerative joint disease of hand 07/14/2017   Dupuytren's contracture 07/12/2019   Essential hypertension 12/04/2011   Fatty liver 12/29/2020   Fatty tumor    waste and back   Fibromyalgia    Generalized anxiety disorder 06/12/2020   12/21 He is reporting severe insomnia and anxiety symptoms.  Options are limited.  We will try a low-dose lorazepam at night and during the day for anxiety and panic attacks.  He will stop lorazepam if problems.  He will stop drinking alcohol.  Discontinue BuSpar.  Reduce Cymbalta to 1 a day.   GERD (gastroesophageal reflux disease)    Glaucoma 03/08/2016   Gout 03/08/2016   Hereditary and idiopathic peripheral neuropathy 08/28/2015   History of COVID-19 05/08/2019   2021 Post-COVID sx's -" brain fog" Try Lion's mane supplement and B complex with niacin for neuropathy   Hyperlipidemia 03/10/2017   Hypothyroidism    Hypoxia    Insomnia    Left-sided sensorineural hearing loss 07/12/2018   Lumbar spondylosis 02/11/2022   Major depressive disorder 09/07/2016   Mild cognitive impairment of uncertain or unknown etiology 12/27/2020   Obesity (BMI 30.0-34.9) 12/08/2016   Obstructive sleep  apnea    Osteoarthritis    Osteoporosis    Pain in joint of right ankle 09/15/2021   Persistent atrial fibrillation    on Eliquis   Recurrent falls 03/25/2020   PT. Treat neuropathy, insomnia.  Reduce Cymbalta to 1 a day.  Discontinue BuSpar.   Rib pain on left side 07/25/2021   Rotator cuff arthropathy of left shoulder 07/21/2019   SDH (subdural hematoma) 03/21/2020   Recurrent SDH secondary to falls at home- Sept 2019 and again 03/14/2020- Eliquis stopped.   Sebaceous cyst 06/27/2019   Spinal compression fracture seventh vertebre   Spondylolisthesis 09/07/2016   On fosamax - for about one year - Dr Alyson Ingles  10/14/17 dexa: normal dexa -- spine 2.1,   RFN -0.9,  LFN   -0.7   - no comparison on file, previous fracture      Thoracic back pain 08/10/2017   Thrombocytopenia 12/20/2020   TIA (transient ischemic attack) 08/28/2015   Type 2 diabetes mellitus 03/08/2016    Past Surgical History:  Procedure Laterality Date   APPENDECTOMY  age 32   [place] Right    early 2000s   CATARACT EXTRACTION     bilateral   CHOLECYSTECTOMY  age 56   EYE SURGERY     FOOT ARTHRODESIS Right 02/02/2013   Procedure: RIGHT HALLUX METATARSAL PHALANGEAL JOINT ARTHRODESIS ;  Surgeon: Wylene Simmer, MD;  Location: [place];  Service: Orthopedics;  Laterality: Right;   INGUINAL HERNIA REPAIR  age 26   rt side   NASAL CONCHA BULLOSA RESECTION  age 77   PROSTATE SURGERY     SHOULDER ARTHROSCOPY W/ ROTATOR CUFF REPAIR Right    early 2000s   tonsil     VASECTOMY  age 34    Current Outpatient Medications:    acetaminophen (TYLENOL) 650 MG CR tablet, Take 1,300 mg by mouth in the morning and at bedtime., Disp: , Rfl:    acyclovir ointment (ZOVIRAX) 5 %, Apply 1 application topically as needed., Disp: , Rfl:    allopurinol (ZYLOPRIM) 300 MG tablet, Take 1 tablet (300 mg total) by mouth every evening. For gout, Disp: 90 tablet, Rfl: 3   amiodarone (PACERONE) 200 MG tablet, Take 200 mg  by mouth daily., Disp: , Rfl:  amLODipine (NORVASC) 5 MG tablet, Take 1 tablet (5 mg total) by mouth daily., Disp: 90 tablet, Rfl: 3   atorvastatin (LIPITOR) 20 MG tablet, TAKE 1 TABLET BY MOUTH DAILY. FOR CHOLESTEROL, Disp: 90 tablet, Rfl: 1   blood glucose meter kit and supplies KIT, Dispense based on patient and insurance preference. Use daily as directed. (FOR E11.9)., Disp: 1 each, Rfl: 0   Calcium Citrate-Vitamin D (CALCIUM + D PO), Take 1 tablet by mouth daily., Disp: , Rfl:    carboxymethylcellulose (REFRESH PLUS) 0.5 % SOLN, Place 1 drop into both eyes 3 (three) times daily as needed (dry eyes)., Disp: , Rfl:    clobetasol (TEMOVATE) 0.05 % external solution, Apply 1 application topically 2 (two) times daily as needed (rash). (Patient not taking: Reported on 02/11/2022), Disp: , Rfl:    clotrimazole-betamethasone (LOTRISONE) cream, Apply sparingly topical twice daily (Patient not taking: Reported on 02/11/2022), Disp: , Rfl:    dapagliflozin propanediol (FARXIGA) 10 MG TABS tablet, Take 1 tablet (10 mg total) by mouth daily before breakfast. For diabetes, Disp: 90 tablet, Rfl: 2   Emollient (RA RENEWAL DARK SPOT CORRECTOR EX), Apply 1 application topically daily as needed (dark spots)., Disp: , Rfl:    FLUoxetine (PROZAC) 20 MG capsule, TAKE 1 CAPSULE EVERY DAY ALONG WITH 10MG TO EQUAL 30MG TOTAL, Disp: 90 capsule, Rfl: 1   Fluticasone Propionate (ALLERGY RELIEF NA), Place 2 sprays into the nose daily as needed (allergies)., Disp: , Rfl:    hydrochlorothiazide (HYDRODIURIL) 12.5 MG tablet, TAKE 1 TABLET BY MOUTH EVERY DAY, Disp: 90 tablet, Rfl: 1   Lancets MISC, Use to check blood sugars daily. E11.9, Disp: 100 each, Rfl: 3   latanoprost (XALATAN) 0.005 % ophthalmic solution, Place 1 drop into both eyes at bedtime., Disp: , Rfl:    levothyroxine (SYNTHROID) 112 MCG tablet, TAKE 1 TABLET BY MOUTH EVERY MORNING BEFORE BREAKFAST, Disp: 90 tablet, Rfl: 1   Lido-Menthol-Methyl Sal-Camph (CBD  KINGS EX), Apply topically as needed., Disp: , Rfl:    Melatonin 5 MG TABS, Take 5 mg by mouth at bedtime., Disp: , Rfl:    mometasone (NASONEX) 50 MCG/ACT nasal spray, Place 2 sprays into the nose as needed (allergies)., Disp: 17 g, Rfl: 5   Multiple Vitamin (MULTIVITAMIN) tablet, Take 1 tablet by mouth daily., Disp: , Rfl:    nitroGLYCERIN (NITROSTAT) 0.4 MG SL tablet, Place 1 tablet (0.4 mg total) under the tongue every 5 (five) minutes as needed for chest pain. (Patient not taking: Reported on 02/11/2022), Disp: 90 tablet, Rfl: 3   omeprazole (PRILOSEC) 20 MG capsule, TAKE 1 CAPSULE BY MOUTH EVERY DAY, Disp: 90 capsule, Rfl: 1   oxyCODONE-acetaminophen (PERCOCET) 10-325 MG tablet, Take 1 tablet by mouth every 8 (eight) hours as needed for pain., Disp: , Rfl:    pregabalin (LYRICA) 75 MG capsule, TAKE 1 CAPSULE BY MOUTH TWICE A DAY, Disp: 180 capsule, Rfl: 0   Probiotic Product (PROBIOTIC PO), Take 1 capsule by mouth daily., Disp: , Rfl:    RESTASIS 0.05 % ophthalmic emulsion, Place 1 drop into both eyes 2 (two) times daily. , Disp: , Rfl:    Semaglutide (RYBELSUS) 7 MG TABS, Take 7 mg by mouth daily. Via [employer] pt assistance (Patient not taking: Reported on 02/11/2022), Disp: 30 tablet, Rfl: 3   sildenafil (REVATIO) 20 MG tablet, Take 40-100 mg by mouth daily as needed., Disp: , Rfl:    silodosin (RAPAFLO) 8 MG CAPS capsule, Take 8 mg by mouth  daily., Disp: , Rfl:    telmisartan (MICARDIS) 40 MG tablet, Take 1 tablet (40 mg total) by mouth daily., Disp: 90 tablet, Rfl: 3   Vibegron (GEMTESA) 75 MG TABS, Take 75 mg by mouth daily., Disp: , Rfl:    vitamin C (ASCORBIC ACID) 500 MG tablet, Take 500 mg by mouth daily., Disp: , Rfl:    VITAMIN D, CHOLECALCIFEROL, PO, Take 1 capsule by mouth daily., Disp: , Rfl:   Clinical Interview:   The following information was obtained during a clinical interview with Mr. Condie prior to cognitive testing.  Cognitive Symptoms: Decreased short-term memory:  Endorsed. Previously provided examples included trouble remembering details of previous conversations, forgetting to complete previously started tasks, and trouble remembering the names of familiar individuals.  Decreased long-term memory: Denied.  Decreased attention/concentration: Endorsed. While never formally diagnosed with ADHD, Mr. Pelzer previously described longstanding traits of this condition dating back to adolescence. These include trouble maintaining his focus, ease of distractibility, starting projects and being unable to finish them, needing to re-read passages several times, and instances of impulsivity.  Reduced processing speed: Endorsed. He previously described periodic episodes of significant "brain fog." He also provided an example of having a "screen" between him and the rest of the world which is occasionally present and creates difficulties.  Difficulties with executive functions: Endorsed. Previous difficulties surrounded those common in ADHD, including disorganization, complex planning, and impulsivity. Concerns regarding judgment were denied. Acutely, he did describe greater difficulty with organization and multi-tasking. He noted that purchasing plane tickets and putting together an itinerary for an upcoming trip to see his daughter in [place] seemed far harder than it once was.  Difficulties with emotion regulation: Denied. Difficulties with receptive language: Denied. Difficulties with word finding: Endorsed. However, these were previously described as mild in nature and occurring somewhat. Decreased visuoperceptual ability: Endorsed. Specifically, he previously reported frequently bumping into things (e.g., door frames) in his environment consistently with his left shoulder/side.    Trajectory of deficits: Attention/concentration related difficulties were said to be longstanding in nature, dating back to adolescence. Additional deficits were said to first become noticeable  following a concussion sustained in 2002 (see below). They were said to have exhibited a gradual decline since that time and seem more pronounced at some points in time relative to others. Relative to his prior evaluation in 2020, he reported diffuse cognitive decline. This was especially expressed surrounding short-term memory where he stated having a "touch of dementia" (despite prior evaluations suggesting that functioning was within normal limits) and that his memory had "almost disappeared" (which was not at all supported by previous testing). Acutely, he reported his perception of continued diffuse cognitive decline, with a particular emphasis surrounding short-term memory.    Difficulties completing ADLs: Largely denied. He manages his medications independently but did describe infrequent instances where he may forget to take a morning dose. He described a new system he has developed with has been helpful at minimizing these occurrences. He previously noted trouble with managing personal finances, stating that he forgot to file his income taxes or an extension request due to being unable to "sit down and do it." However, this was perceived as trouble with procrastination and motivation rather than difficulty mentally performing these actions. He reported driving without difficulty. Generally speaking, functioning was described as quite stable relative to his previous evaluations.   Additional Medical History: History of traumatic brain injury/concussion: Endorsed. Mr. Spadafore previously reported experiencing a concussion after falling approximately 8 feet  and hitting his head in 2002. He denied a loss in consciousness from this event, but did acknowledge feeling dazed and confused. Additionally, he reported falling and hitting his head while attempting to wheel his wife down a ramp in September 2019. Again, he denied a loss in consciousness, but did feel dazed and confused and was briefly hospitalized.  Neuroimaging suggested the presence of an anterior right parafalcine subdural hematoma without significant mass effect or midline shift. More recently, he experienced another fall in 2021 which resulted in additional small hemorrhaging (see above). Acutely, he described several falls additional falls where he may have hit his head; however, he was unclear if these had happened before or after the completion of his previous evaluation.  History of stroke: Denied. However, he previously reported experiencing potential TIAs in late 2019. Associated symptoms included brief periods of significantly increased brain fog which eventually subsided on its own. History of seizure activity: Denied. History of known exposure to toxins: Denied. Symptoms of chronic pain: Endorsed. Mr. Gregori has been diagnosed with fibromyalgia, arthritis, and neuropathy. He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

## 2022-04-28 DIAGNOSIS — E119 Type 2 diabetes mellitus without complications: Secondary | ICD-10-CM | POA: Diagnosis not present

## 2022-04-28 DIAGNOSIS — H401131 Primary open-angle glaucoma, bilateral, mild stage: Secondary | ICD-10-CM | POA: Diagnosis not present

## 2022-04-28 LAB — HM DIABETES EYE EXAM

## 2022-04-29 DIAGNOSIS — M47816 Spondylosis without myelopathy or radiculopathy, lumbar region: Secondary | ICD-10-CM | POA: Diagnosis not present

## 2022-04-30 ENCOUNTER — Encounter: Payer: Self-pay | Admitting: Internal Medicine

## 2022-04-30 NOTE — Progress Notes (Signed)
Outside notes received. Information abstracted. Notes sent to scan.  

## 2022-05-05 ENCOUNTER — Encounter: Payer: Self-pay | Admitting: Cardiology

## 2022-05-06 ENCOUNTER — Telehealth: Payer: Self-pay | Admitting: Internal Medicine

## 2022-05-06 NOTE — Telephone Encounter (Signed)
LVM for pt to rtn my call to schedule AWV with [facility] call back # [phone] 

## 2022-05-13 ENCOUNTER — Other Ambulatory Visit: Payer: Self-pay | Admitting: Internal Medicine

## 2022-05-14 ENCOUNTER — Ambulatory Visit: Payer: PPO | Admitting: Internal Medicine

## 2022-05-14 NOTE — Telephone Encounter (Signed)
Medication was called in  

## 2022-05-16 NOTE — Progress Notes (Signed)
Carelink Summary Report / Loop Recorder 

## 2022-05-18 ENCOUNTER — Ambulatory Visit (INDEPENDENT_AMBULATORY_CARE_PROVIDER_SITE_OTHER): Payer: PPO

## 2022-05-18 DIAGNOSIS — R55 Syncope and collapse: Secondary | ICD-10-CM | POA: Diagnosis not present

## 2022-05-18 LAB — CUP PACEART REMOTE DEVICE CHECK
Date Time Interrogation Session: 20231203231308
Implantable Pulse Generator Implant Date: 20230612

## 2022-05-18 NOTE — Patient Instructions (Signed)
Health Maintenance, Male Adopting a healthy lifestyle and getting preventive care are important in promoting health and wellness. Ask your health care provider about: The right schedule for you to have regular tests and exams. Things you can do on your own to prevent diseases and keep yourself healthy. What should I know about diet, weight, and exercise? Eat a healthy diet  Eat a diet that includes plenty of vegetables, fruits, low-fat dairy products, and lean protein. Do not eat a lot of foods that are high in solid fats, added sugars, or sodium. Maintain a healthy weight Body mass index (BMI) is a measurement that can be used to identify possible weight problems. It estimates body fat based on height and weight. Your health care provider can help determine your BMI and help you achieve or maintain a healthy weight. Get regular exercise Get regular exercise. This is one of the most important things you can do for your health. Most adults should: Exercise for at least 150 minutes each week. The exercise should increase your heart rate and make you sweat (moderate-intensity exercise). Do strengthening exercises at least twice a week. This is in addition to the moderate-intensity exercise. Spend less time sitting. Even light physical activity can be beneficial. Watch cholesterol and blood lipids Have your blood tested for lipids and cholesterol at 86 years of age, then have this test every 5 years. You may need to have your cholesterol levels checked more often if: Your lipid or cholesterol levels are high. You are older than 86 years of age. You are at high risk for heart disease. What should I know about cancer screening? Many types of cancers can be detected early and may often be prevented. Depending on your health history and family history, you may need to have cancer screening at various ages. This may include screening for: Colorectal cancer. Prostate cancer. Skin cancer. Lung  cancer. What should I know about heart disease, diabetes, and high blood pressure? Blood pressure and heart disease High blood pressure causes heart disease and increases the risk of stroke. This is more likely to develop in people who have high blood pressure readings or are overweight. Talk with your health care provider about your target blood pressure readings. Have your blood pressure checked: Every 3-5 years if you are 18-39 years of age. Every year if you are 40 years old or older. If you are between the ages of 65 and 75 and are a current or former smoker, ask your health care provider if you should have a one-time screening for abdominal aortic aneurysm (AAA). Diabetes Have regular diabetes screenings. This checks your fasting blood sugar level. Have the screening done: Once every three years after age 45 if you are at a normal weight and have a low risk for diabetes. More often and at a younger age if you are overweight or have a high risk for diabetes. What should I know about preventing infection? Hepatitis B If you have a higher risk for hepatitis B, you should be screened for this virus. Talk with your health care provider to find out if you are at risk for hepatitis B infection. Hepatitis C Blood testing is recommended for: Everyone born from 1945 through 1965. Anyone with known risk factors for hepatitis C. Sexually transmitted infections (STIs) You should be screened each year for STIs, including gonorrhea and chlamydia, if: You are sexually active and are younger than 86 years of age. You are older than 86 years of age and your   health care provider tells you that you are at risk for this type of infection. Your sexual activity has changed since you were last screened, and you are at increased risk for chlamydia or gonorrhea. Ask your health care provider if you are at risk. Ask your health care provider about whether you are at high risk for HIV. Your health care provider  may recommend a prescription medicine to help prevent HIV infection. If you choose to take medicine to prevent HIV, you should first get tested for HIV. You should then be tested every 3 months for as long as you are taking the medicine. Follow these instructions at home: Alcohol use Do not drink alcohol if your health care provider tells you not to drink. If you drink alcohol: Limit how much you have to 0-2 drinks a day. Know how much alcohol is in your drink. In the [place] one drink equals one 12 oz bottle of beer (355 mL), one 5 oz glass of Makala Fetterolf (148 mL), or one 1 oz glass of hard liquor (44 mL). Lifestyle Do not use any products that contain nicotine or tobacco. These products include cigarettes, chewing tobacco, and vaping devices, such as e-cigarettes. If you need help quitting, ask your health care provider. Do not use street drugs. Do not share needles. Ask your health care provider for help if you need support or information about quitting drugs. General instructions Schedule regular health, dental, and eye exams. Stay current with your vaccines. Tell your health care provider if: You often feel depressed. You have ever been abused or do not feel safe at home. Summary Adopting a healthy lifestyle and getting preventive care are important in promoting health and wellness. Follow your health care provider's instructions about healthy diet, exercising, and getting tested or screened for diseases. Follow your health care provider's instructions on monitoring your cholesterol and blood pressure. This information is not intended to replace advice given to you by your health care provider. Make sure you discuss any questions you have with your health care provider. Document Revised: 10/21/2020 Document Reviewed: 10/21/2020 Elsevier Patient Education  2023 [employer]  

## 2022-05-18 NOTE — Progress Notes (Unsigned)
Subjective:   Jonathan [name] is a 88 y.o. male who presents for Medicare Annual/Subsequent preventive examination. I connected with  Ave Filter on 05/18/22 by a audio enabled telemedicine application and verified that I am speaking with the correct person using two identifiers.  Patient Location: Home  Provider Location: Home Office  I discussed the limitations of evaluation and management by telemedicine. The patient expressed understanding and agreed to proceed.  Review of Systems    Deferred to PCP       Objective:    There were no vitals filed for this visit. There is no height or weight on file to calculate BMI.     04/20/2022    2:08 PM 02/11/2022   11:47 AM 10/17/2021    9:05 AM 07/30/2021    8:08 AM 04/21/2021    3:41 PM 04/12/2020   11:04 AM 03/14/2020   12:13 PM  Advanced Directives  Does Patient Have a Medical Advance Directive? Yes Yes Yes Yes Yes Yes Yes  Type of Special educational needs teacher of [place];Living will   Healthcare Power of [place];Living will Healthcare Power of [place];Living will   Does patient want to make changes to medical advance directive?  No - Patient declined   No - Patient declined No - Patient declined No - Patient declined  Copy of Healthcare Power of Attorney in Chart?  Yes - validated most recent copy scanned in chart (See row information)         Current Medications (verified) Outpatient Encounter Medications as of 05/19/2022  Medication Sig   acetaminophen (TYLENOL) 650 MG CR tablet Take 1,300 mg by mouth in the morning and at bedtime.   acyclovir ointment (ZOVIRAX) 5 % Apply 1 application topically as needed.   allopurinol (ZYLOPRIM) 300 MG tablet Take 1 tablet (300 mg total) by mouth every evening. For gout   amiodarone (PACERONE) 200 MG tablet Take 200 mg by mouth daily.   amLODipine (NORVASC) 5 MG tablet Take 1 tablet (5 mg total) by mouth daily.   atorvastatin (LIPITOR) 20 MG tablet TAKE 1 TABLET BY MOUTH DAILY. FOR  CHOLESTEROL   blood glucose meter kit and supplies KIT Dispense based on patient and insurance preference. Use daily as directed. (FOR E11.9).   Calcium Citrate-Vitamin D (CALCIUM + D PO) Take 1 tablet by mouth daily.   carboxymethylcellulose (REFRESH PLUS) 0.5 % SOLN Place 1 drop into both eyes 3 (three) times daily as needed (dry eyes).   clobetasol (TEMOVATE) 0.05 % external solution Apply 1 application topically 2 (two) times daily as needed (rash). (Patient not taking: Reported on 02/11/2022)   clotrimazole-betamethasone (LOTRISONE) cream Apply sparingly topical twice daily (Patient not taking: Reported on 02/11/2022)   dapagliflozin propanediol (FARXIGA) 10 MG TABS tablet Take 1 tablet (10 mg total) by mouth daily before breakfast. For diabetes   Emollient (RA RENEWAL DARK SPOT CORRECTOR EX) Apply 1 application topically daily as needed (dark spots).   FLUoxetine (PROZAC) 20 MG capsule TAKE 1 CAPSULE EVERY DAY ALONG WITH 10MG  TO EQUAL 30MG  TOTAL   Fluticasone Propionate (ALLERGY RELIEF NA) Place 2 sprays into the nose daily as needed (allergies).   hydrochlorothiazide (HYDRODIURIL) 12.5 MG tablet TAKE 1 TABLET BY MOUTH EVERY DAY   Lancets MISC Use to check blood sugars daily. E11.9   latanoprost (XALATAN) 0.005 % ophthalmic solution Place 1 drop into both eyes at bedtime.   levothyroxine (SYNTHROID) 112 MCG tablet TAKE 1 TABLET BY MOUTH EVERY MORNING BEFORE BREAKFAST  Lido-Menthol-Methyl Sal-Camph (CBD KINGS EX) Apply topically as needed.   Melatonin 5 MG TABS Take 5 mg by mouth at bedtime.   mometasone (NASONEX) 50 MCG/ACT nasal spray Place 2 sprays into the nose as needed (allergies).   Multiple Vitamin (MULTIVITAMIN) tablet Take 1 tablet by mouth daily.   nitroGLYCERIN (NITROSTAT) 0.4 MG SL tablet Place 1 tablet (0.4 mg total) under the tongue every 5 (five) minutes as needed for chest pain. (Patient not taking: Reported on 02/11/2022)   omeprazole (PRILOSEC) 20 MG capsule TAKE 1 CAPSULE BY  MOUTH EVERY DAY   oxyCODONE-acetaminophen (PERCOCET) 10-325 MG tablet Take 1 tablet by mouth every 8 (eight) hours as needed for pain.   pregabalin (LYRICA) 75 MG capsule TAKE 1 CAPSULE BY MOUTH TWICE A DAY   Probiotic Product (PROBIOTIC PO) Take 1 capsule by mouth daily.   RESTASIS 0.05 % ophthalmic emulsion Place 1 drop into both eyes 2 (two) times daily.    Semaglutide (RYBELSUS) 7 MG TABS Take 7 mg by mouth daily. Via [employer] pt assistance (Patient not taking: Reported on 02/11/2022)   sildenafil (REVATIO) 20 MG tablet Take 40-100 mg by mouth daily as needed.   silodosin (RAPAFLO) 8 MG CAPS capsule Take 8 mg by mouth daily.   telmisartan (MICARDIS) 40 MG tablet Take 1 tablet (40 mg total) by mouth daily.   Vibegron (GEMTESA) 75 MG TABS Take 75 mg by mouth daily.   vitamin C (ASCORBIC ACID) 500 MG tablet Take 500 mg by mouth daily.   VITAMIN D, CHOLECALCIFEROL, PO Take 1 capsule by mouth daily.   No facility-administered encounter medications on file as of 05/19/2022.    Allergies (verified) Other   History: Past Medical History:  Diagnosis Date   Abdominal aortic atherosclerosis 02/11/2022   Acute encephalopathy 05/08/2019   Allergic rhinitis, seasonal 12/04/2011   Aortic atherosclerosis 02/04/2021   Arthritis of finger of both hands 03/27/2021   BPH (benign prostatic hyperplasia)    Cataract    Chronic anticoagulation 12/21/2017   CHADS VASC=4, Eliquis stopped Sept 2021- recurrent falls with SDH   Chronic pain, legs and back 03/10/2017   Preferred pain management Leg and back pain   Compression fracture of thoracic vertebra 09/21/2017   COPD (chronic obstructive pulmonary disease)    2 liters O2 HS   Degeneration of thoracic intervertebral disc 09/21/2017   Degenerative joint disease of hand 07/14/2017   Dupuytren's contracture 07/12/2019   Essential hypertension 12/04/2011   Fatty liver 12/29/2020   Fatty tumor    waste and back   Fibromyalgia    Generalized anxiety  disorder 06/12/2020   12/21 He is reporting severe insomnia and anxiety symptoms.  Options are limited.  We will try a low-dose lorazepam at night and during the day for anxiety and panic attacks.  He will stop lorazepam if problems.  He will stop drinking alcohol.  Discontinue BuSpar.  Reduce Cymbalta to 1 a day.   GERD (gastroesophageal reflux disease)    Glaucoma 03/08/2016   Gout 03/08/2016   Hereditary and idiopathic peripheral neuropathy 08/28/2015   History of COVID-19 05/08/2019   2021 Post-COVID sx's -" brain fog" Try Lion's mane supplement and B complex with niacin for neuropathy   Hyperlipidemia 03/10/2017   Hypothyroidism    Hypoxia    Insomnia    Left-sided sensorineural hearing loss 07/12/2018   Lumbar spondylosis 02/11/2022   Major depressive disorder 09/07/2016   Mild cognitive impairment of uncertain or unknown etiology 12/27/2020   Obesity (  BMI 30.0-34.9) 12/08/2016   Obstructive sleep apnea    Osteoarthritis    Osteoporosis    Pain in joint of right ankle 09/15/2021   Persistent atrial fibrillation    on Eliquis   Recurrent falls 03/25/2020   PT. Treat neuropathy, insomnia.  Reduce Cymbalta to 1 a day.  Discontinue BuSpar.   Rib pain on left side 07/25/2021   Rotator cuff arthropathy of left shoulder 07/21/2019   SDH (subdural hematoma) 03/21/2020   Recurrent SDH secondary to falls at home- Sept 2019 and again 03/14/2020- Eliquis stopped.   Sebaceous cyst 06/27/2019   Spinal compression fracture seventh vertebre   Spondylolisthesis 09/07/2016   On fosamax - for about one year - Dr Ronne Binning  10/14/17 dexa: normal dexa -- spine 2.1,   RFN -0.9,  LFN   -0.7   - no comparison on file, previous fracture      Thoracic back pain 08/10/2017   Thrombocytopenia 12/20/2020   TIA (transient ischemic attack) 08/28/2015   Type 2 diabetes mellitus 03/08/2016   Past Surgical History:  Procedure Laterality Date   APPENDECTOMY  age 88   CARPAL TUNNEL RELEASE Right    early  2000s   CATARACT EXTRACTION     bilateral   CHOLECYSTECTOMY  age 56   EYE SURGERY     FOOT ARTHRODESIS Right 02/02/2013   Procedure: RIGHT HALLUX METATARSAL PHALANGEAL JOINT ARTHRODESIS ;  Surgeon: Toni Arthurs, MD;  Location: [facility];  Service: Orthopedics;  Laterality: Right;   INGUINAL HERNIA REPAIR  age 54   rt side   NASAL CONCHA BULLOSA RESECTION  age 82   PROSTATE SURGERY     SHOULDER ARTHROSCOPY W/ ROTATOR CUFF REPAIR Right    early 2000s   tonsil     VASECTOMY  age 27   Family History  Problem Relation Age of Onset   Coronary artery disease Brother    Heart disease Father    Lung cancer Father    Kidney cancer Father    Prostate cancer Father    Arthritis Mother    Lung cancer Mother    Dementia Mother        Unspecified type, not Alzheimer's disease   Social History   Socioeconomic History   Marital status: Married    Spouse name: Not on file   Number of children: 2   Years of education: 63   Highest education level: Master's degree (e.g., MA, MS, MEng, MEd, MSW, MBA)  Occupational History   Occupation: Retired  Tobacco Use   Smoking status: Former    Packs/day: 2.00    Years: 10.00    Total pack years: 20.00    Types: Cigarettes    Quit date: 07/18/1975    Years since quitting: 46.8   Smokeless tobacco: Never  Vaping Use   Vaping Use: Never used  Substance and Sexual Activity   Alcohol use: Yes    Alcohol/week: 14.0 standard drinks of alcohol    Types: 14 Shots of liquor per week    Comment: 2 shots nightly   Drug use: No   Sexual activity: Not Currently  Other Topics Concern   Not on file  Social History Narrative   Right handed   Master degree   Drinks caffeine retired   retired   International aid/development worker of Corporate investment banker Strain: Low Risk  (04/26/2020)   Overall Financial Resource Strain (CARDIA)    Difficulty of Paying Living Expenses: Not hard at all  Recent Concern: Financial Resource Strain - Medium Risk  (03/12/2020)   Overall Financial Resource Strain (CARDIA)    Difficulty of Paying Living Expenses: Somewhat hard  Food Insecurity: No Food Insecurity (04/26/2020)   Hunger Vital Sign    Worried About Running Out of Food in the Last Year: Never true    Ran Out of Food in the Last Year: Never true  Transportation Needs: No Transportation Needs (04/26/2020)   PRAPARE - Administrator, Civil Service (Medical): No    Lack of Transportation (Non-Medical): No  Physical Activity: Inactive (04/26/2020)   Exercise Vital Sign    Days of Exercise per Week: 0 days    Minutes of Exercise per Session: 0 min  Stress: No Stress Concern Present (04/26/2020)   [employer] of Occupational Health - Occupational Stress Questionnaire    Feeling of Stress : Not at all  Social Connections: Socially Integrated (04/26/2020)   Social Connection and Isolation Panel [NHANES]    Frequency of Communication with Friends and Family: More than three times a week    Frequency of Social Gatherings with Friends and Family: Once a week    Attends Religious Services: 1 to 4 times per year    Active Member of [employer] or Organizations: Yes    Attends Banker Meetings: 1 to 4 times per year    Marital Status: Married    Tobacco Counseling Counseling given: Not Answered   Clinical Intake:                 Diabetic?Yes Nutrition Risk Assessment:  Has the patient had any N/V/D within the last 2 months?  {YES/NO:21197} Does the patient have any non-healing wounds?  {YES/NO:21197} Has the patient had any unintentional weight loss or weight gain?  {YES/NO:21197}  Diabetes:  Is the patient diabetic?  {YES/NO:21197} If diabetic, was a CBG obtained today?  {YES/NO:21197} Did the patient bring in their glucometer from home?  {YES/NO:21197} How often do you monitor your CBG's? ***.   Financial Strains and Diabetes Management:  Are you having any financial strains with the device, your  supplies or your medication? {YES/NO:21197}.  Does the patient want to be seen by Chronic Care Management for management of their diabetes?  {YES/NO:21197} Would the patient like to be referred to a Nutritionist or for Diabetic Management?  {YES/NO:21197}  Diabetic Exams:  Diabetic Eye Exam: Completed 04/28/22 Diabetic Foot Exam: Overdue, Pt has been advised about the importance in completing this exam. Pt is scheduled for diabetic foot exam on deferred to PCP.          Activities of Daily Living     No data to display           Patient Care Team: Pincus Sanes, MD as PCP - General (Internal Medicine) Jens Som Madolyn Frieze, MD as PCP - Cardiology (Cardiology) Regan Lemming, MD as PCP - Electrophysiology (Cardiology) Marylu Lund, MD as Referring Physician (Family Medicine) Julio Sicks, MD as Consulting Physician (Neurosurgery) Lewayne Bunting, MD as Consulting Physician (Cardiology) Karie Soda, MD as Consulting Physician (General Surgery) Hillery Aldo, NP as Nurse Practitioner (Nurse Practitioner) Kathyrn Sheriff, [facility] as Pharmacist (Pharmacist)  Indicate any recent Medical Services you may have received from other than [facility] providers in the past year (date may be approximate).     Assessment:   This is a routine wellness examination for [place].  Hearing/Vision screen No results found.  Dietary issues and exercise activities discussed:  Goals Addressed   None   Depression Screen    07/09/2021   12:16 PM 04/07/2021    1:05 PM 11/08/2020    4:16 PM 06/12/2020   10:42 AM 04/26/2020    9:39 AM 08/14/2019   10:29 AM 04/26/2019    2:50 PM  PHQ 2/9 Scores  PHQ - 2 Score 2 0 1 3 0 4 2  PHQ- 9 Score 6 4 7 6  16 7     Fall Risk    04/20/2022    2:08 PM 10/17/2021    9:05 AM 07/30/2021    8:08 AM 07/28/2021    1:02 PM 07/09/2021    8:29 AM  Fall Risk   Falls in the past year? 0 1 1 0 0  Number falls in past yr: 0 0 1 0 0  Injury with  Fall? 0 0 1 0 0  Risk for fall due to :    No Fall Risks No Fall Risks  Follow up Falls evaluation completed   Falls evaluation completed Falls evaluation completed    FALL RISK PREVENTION PERTAINING TO THE HOME:  Any stairs in or around the home? {YES/NO:21197} If so, are there any without handrails? {YES/NO:21197} Home free of loose throw rugs in walkways, pet beds, electrical cords, etc? {YES/NO:21197} Adequate lighting in your home to reduce risk of falls? {YES/NO:21197}  ASSISTIVE DEVICES UTILIZED TO PREVENT FALLS:  Life alert? {YES/NO:21197} Use of a cane, walker or w/c? {YES/NO:21197} Grab bars in the bathroom? {YES/NO:21197} Shower chair or bench in shower? {YES/NO:21197} Elevated toilet seat or a handicapped toilet? {YES/NO:21197}  Cognitive Function:    04/26/2019    2:53 PM 04/20/2018   11:40 AM 04/19/2017   10:51 AM 08/28/2015    3:20 PM  MMSE - Mini Mental State Exam  Not completed: Refused  Refused   Orientation to time  5  5  Orientation to Place  5  5  Registration  3  3  Attention/ Calculation  5  5  Recall  2  2  Language- name 2 objects  2  2  Language- repeat  1  1  Language- follow 3 step command  3  3  Language- read & follow direction  1  1  Write a sentence  1  1  Copy design  1  1  Total score  29  29      04/20/2022    2:00 PM 07/30/2021    8:00 AM  Montreal Cognitive Assessment   Visuospatial/ Executive (0/5) 4 4  Naming (0/3) 3 3  Attention: Read list of digits (0/2) 2 2  Attention: Read list of letters (0/1) 1 1  Attention: Serial 7 subtraction starting at 100 (0/3) 3 3  Language: Repeat phrase (0/2) 2 2  Language : Fluency (0/1) 1 1  Abstraction (0/2) 2 1  Delayed Recall (0/5) 4 4  Orientation (0/6) 6 6  Total 28 27  Adjusted Score (based on education) 28 27      04/26/2020    9:44 AM  6CIT Screen  What Year? 0 points  What month? 0 points  What time? 0 points  Count back from 20 0 points  Months in reverse 0 points  Repeat  phrase 0 points  Total Score 0 points    Immunizations Immunization History  Administered Date(s) Administered   Fluad Quad(high Dose 65+) 03/25/2020, 04/07/2021, 03/28/2022   Hepatitis A 07/25/2007, 01/22/2008   Hepatitis B 04/30/1988, 06/02/1988, 10/28/1988  IPV 05/15/1996   Influenza Split 02/19/2012, 03/13/2016   Influenza Whole 02/17/2011, 03/13/2013   Influenza, High Dose Seasonal PF 03/10/2017, 02/15/2018, 02/09/2019   Influenza,inj,Quad PF,6+ Mos 03/15/2015   Influenza,inj,quad, With Preservative 02/14/2019   Meningococcal Polysaccharide 05/15/1996   Moderna Covid-19 Vaccine Bivalent Booster 39yrs & up 02/25/2021, 10/06/2021   Moderna Sars-Covid-2 Vaccination 07/15/2019, 08/12/2019, 02/15/2020   PNEUMOCOCCAL CONJUGATE-20 07/09/2021   Pneumococcal Conjugate-13 12/08/2016   Pneumococcal-Unspecified 02/01/2006   Respiratory Syncytial Virus Vaccine,Recomb Aduvanted(Arexvy) 04/25/2022   Td 01/14/1995   Tdap 03/06/2018   Zoster Recombinat (Shingrix) 01/15/2017, 01/04/2018   Zoster, Live 02/01/2006    TDAP status: Up to date  Flu Vaccine status: Up to date  Pneumococcal vaccine status: Up to date  Covid-19 vaccine status: Information provided on how to obtain vaccines.   Qualifies for Shingles Vaccine? Yes   Zostavax completed No   Shingrix Completed?: Yes  Screening Tests Health Maintenance  Topic Date Due   FOOT EXAM  01/01/2022   COVID-19 Vaccine (6 - 2023-24 season) 02/13/2022   DEXA SCAN  10/15/2022   OPHTHALMOLOGY EXAM  04/29/2023   Medicare Annual Wellness (AWV)  05/20/2023   DTaP/Tdap/Td (3 - Td or Tdap) 03/06/2028   Pneumonia Vaccine 71+ Years old  Completed   INFLUENZA VACCINE  Completed   Zoster Vaccines- Shingrix  Completed   HPV VACCINES  Aged Out    Health Maintenance  Health Maintenance Due  Topic Date Due   FOOT EXAM  01/01/2022   COVID-19 Vaccine (6 - 2023-24 season) 02/13/2022    Colorectal cancer screening: No longer required.    Lung Cancer Screening: (Low Dose CT Chest recommended if Age 4-80 years, 30 pack-year currently smoking OR have quit w/in 15years.) does not qualify.   Additional Screening:  Hepatitis C Screening: does qualify; Completed education provided  Vision Screening: Recommended annual ophthalmology exams for early detection of glaucoma and other disorders of the eye. Is the patient up to date with their annual eye exam?  Yes  Who is the provider or what is the name of the office in which the patient attends annual eye exams? [facility] If pt is not established with a provider, would they like to be referred to a provider to establish care?  N/A .   Dental Screening: Recommended annual dental exams for proper oral hygiene  Community Resource Referral / Chronic Care Management: CRR required this visit?  {YES/NO:21197}  CCM required this visit?  {YES/NO:21197}     Plan:     I have personally reviewed and noted the following in the patient's chart:   Medical and social history Use of alcohol, tobacco or illicit drugs  Current medications and supplements including opioid prescriptions. Patient is currently taking opioid prescriptions. Information provided to patient regarding non-opioid alternatives. Patient advised to discuss non-opioid treatment plan with their provider. Functional ability and status Nutritional status Physical activity Advanced directives List of other physicians Hospitalizations, surgeries, and ER visits in previous 12 months Vitals Screenings to include cognitive, depression, and falls Referrals and appointments  In addition, I have reviewed and discussed with patient certain preventive protocols, quality metrics, and best practice recommendations. A written personalized care plan for preventive services as well as general preventive health recommendations were provided to patient.     Wanda Plump, RN   05/18/2022   Nurse Notes:  Mr. Andrada  , Thank you for taking time to come for your Medicare Wellness Visit. I appreciate your ongoing commitment to your health goals.  Please review the following plan we discussed and let me know if I can assist you in the future.   These are the goals we discussed:  Goals      Cut out extra servings     I am working to keep my blood sugar down by limiting keeping ice cream and sweets in the house.     Manage My Medicine     Timeframe:  Long-Range Goal Priority:  High Start Date:        12/26/20                     Expected End Date:    06/28/21                   Follow Up Date Jan 2023   - call for medicine refill 2 or 3 days before it runs out - call if I am sick and can't take my medicine - keep a list of all the medicines I take; vitamins and herbals too - use a pillbox to sort medicine  -Call [facility] customer service for replacement -Take Rybelsus 30 min before other morning medications.   Why is this important?   These steps will help you keep on track with your medicines.   Notes:         This is a list of the screening recommended for you and due dates:  Health Maintenance  Topic Date Due   Complete foot exam   01/01/2022   COVID-19 Vaccine (6 - 2023-24 season) 02/13/2022   DEXA scan (bone density measurement)  10/15/2022   Eye exam for diabetics  04/29/2023   Medicare Annual Wellness Visit  05/20/2023   DTaP/Tdap/Td vaccine (3 - Td or Tdap) 03/06/2028   Pneumonia Vaccine  Completed   Flu Shot  Completed   Zoster (Shingles) Vaccine  Completed   HPV Vaccine  Aged Out

## 2022-05-19 ENCOUNTER — Ambulatory Visit: Payer: PPO | Admitting: *Deleted

## 2022-05-19 ENCOUNTER — Encounter: Payer: PPO | Admitting: Psychology

## 2022-05-19 ENCOUNTER — Encounter: Payer: Self-pay | Admitting: Internal Medicine

## 2022-05-19 ENCOUNTER — Ambulatory Visit: Payer: PPO | Admitting: Psychology

## 2022-05-19 DIAGNOSIS — G3184 Mild cognitive impairment, so stated: Secondary | ICD-10-CM

## 2022-05-19 DIAGNOSIS — F411 Generalized anxiety disorder: Secondary | ICD-10-CM

## 2022-05-19 DIAGNOSIS — S065XAA Traumatic subdural hemorrhage with loss of consciousness status unknown, initial encounter: Secondary | ICD-10-CM

## 2022-05-19 DIAGNOSIS — Z0000 Encounter for general adult medical examination without abnormal findings: Secondary | ICD-10-CM

## 2022-05-19 DIAGNOSIS — I6781 Acute cerebrovascular insufficiency: Secondary | ICD-10-CM | POA: Diagnosis not present

## 2022-05-19 DIAGNOSIS — F330 Major depressive disorder, recurrent, mild: Secondary | ICD-10-CM | POA: Diagnosis not present

## 2022-05-19 NOTE — Progress Notes (Signed)
   Neuropsychology Feedback Session [name]. [place] Department of Neurology  Reason for Referral:   Jonathan [name] is a 88 y.o. right-handed Caucasian male referred by Sharene Butters, PA-C, to characterize his current cognitive functioning and assist with diagnostic clarity and treatment planning in the context of a prior diagnosis of mild cognitive impairment and concern for progressive cognitive decline.   Feedback:   Jonathan [name] completed a comprehensive neuropsychological evaluation on 04/22/2022. Please refer to that encounter for the full report and recommendations. Briefly, results suggested a primary weakness surrounding processing speed. Performance variability was exhibited across executive functioning and, to a lesser extent, delayed retrieval aspects of verbal memory. However, as variability suggests, several performances across executive functioning and verbal memory were quite strong. Performances were appropriate relative to age-matched peers across all other assessed cognitive domains. Relative to previous testing, performances were largely stable. In fact, there were some isolated tasks that suggested improvement relative to previous testing. Despite Jonathan [name] report of significant diffuse cognitive decline during interview, no areas across testing suggested any degree of decline. Jonathan [name] continues to not display concerning features or testing patterns for any neurodegenerative illness. This includes Alzheimer's disease, Lewy body disease, frontotemporal lobar degeneration, Parkinson's disease, or another more rare parkinsonian condition. The most likely cause for testing performances and day-to-day subjective dysfunction continues to be his myriad of complex medical and psychiatric factors. This would include numerous medical ailments, especially those affecting cardio and cerebrovascular functioning, reported longstanding traits of ADHD, mild psychiatric  distress, chronic pain and fibromyalgia, sleep dysfunction, alcohol use, polypharmacy, and prior neuroimaging surrounding a subdural hematoma, high-grade stenosis, microvascular ischemic disease, and generalized atrophy.   Jonathan [name] was accompanied by his wife during the current feedback session. Content of the current session focused on the results of his neuropsychological evaluation. Jonathan [name] was given the opportunity to ask questions and his questions were answered. He was encouraged to reach out should additional questions arise. A copy of his report was provided at the conclusion of the visit.      30 minutes were spent conducting the current feedback session with Jonathan [name], billed as one unit [phone].

## 2022-05-19 NOTE — Patient Instructions (Addendum)
      Blood work was ordered.   The lab is on the first floor.    Medications changes include :   start omeprazole 20 mg every other day for a while - if not heartburn then decrease further - slowly taper   Make sure you are taking the rybelsus.      Return in about 6 months (around 11/19/2022) for follow up.

## 2022-05-19 NOTE — Progress Notes (Signed)
Subjective:    Patient ID: Jonathan [name], male    DOB: 07-16-1932, 88 y.o.   MRN: [id number]     HPI Jonathan [name] is here for follow up of his chronic medical problems, including htn, DM, afib, hld, insomnia, fibromyalgia, peripheral neuropathy, OAB, hypothyroidism, GAD, MDD, gout  Saw Dr. Melvyn [name] yesterday-no evidence of dementia.  Subjective memory issues likely related to polypharmacy, psychological causes, sleep issues, etc.  No longer on Eliquis-falls  Easier falling asleep.  Waking up in middle of night - reading some and still not sleeping - has an alcoholic drink and then able to get back to sleep.   Lots of pain.  Back, joints - needs TKR and TSR  Increased balance problems.   Frequent cold sores.    Off farxiga x 6 months.  He thinks he is taking the rybelsus.    Medications and allergies reviewed with patient and updated if appropriate.  Current Outpatient Medications on File Prior to Visit  Medication Sig Dispense Refill   acetaminophen (TYLENOL) 650 MG CR tablet Take 1,300 mg by mouth in the morning and at bedtime.     acyclovir ointment (ZOVIRAX) 5 % Apply 1 application topically as needed.     allopurinol (ZYLOPRIM) 300 MG tablet Take 1 tablet (300 mg total) by mouth every evening. For gout 90 tablet 3   amiodarone (PACERONE) 200 MG tablet Take 200 mg by mouth daily.     amLODipine (NORVASC) 5 MG tablet Take 1 tablet (5 mg total) by mouth daily. 90 tablet 3   atorvastatin (LIPITOR) 20 MG tablet TAKE 1 TABLET BY MOUTH DAILY. FOR CHOLESTEROL 90 tablet 1   blood glucose meter kit and supplies KIT Dispense based on patient and insurance preference. Use daily as directed. (FOR E11.9). 1 each 0   Calcium Citrate-Vitamin D (CALCIUM + D PO) Take 1 tablet by mouth daily.     carboxymethylcellulose (REFRESH PLUS) 0.5 % SOLN Place 1 drop into both eyes 3 (three) times daily as needed (dry eyes).     Emollient (RA RENEWAL DARK SPOT CORRECTOR EX) Apply 1 application  topically daily as needed (dark spots).     Fluticasone Propionate (ALLERGY RELIEF NA) Place 2 sprays into the nose daily as needed (allergies).     hydrochlorothiazide (HYDRODIURIL) 12.5 MG tablet TAKE 1 TABLET BY MOUTH EVERY DAY 90 tablet 1   Lancets MISC Use to check blood sugars daily. E11.9 100 each 3   latanoprost (XALATAN) 0.005 % ophthalmic solution Place 1 drop into both eyes at bedtime.     levothyroxine (SYNTHROID) 112 MCG tablet TAKE 1 TABLET BY MOUTH EVERY MORNING BEFORE BREAKFAST 90 tablet 1   Lido-Menthol-Methyl Sal-Camph (CBD KINGS EX) Apply topically as needed.     Melatonin 5 MG TABS Take 5 mg by mouth at bedtime.     Multiple Vitamin (MULTIVITAMIN) tablet Take 1 tablet by mouth daily.     nitroGLYCERIN (NITROSTAT) 0.4 MG SL tablet Place 1 tablet (0.4 mg total) under the tongue every 5 (five) minutes as needed for chest pain. 90 tablet 3   omeprazole (PRILOSEC) 20 MG capsule TAKE 1 CAPSULE BY MOUTH EVERY DAY 90 capsule 1   oxyCODONE-acetaminophen (PERCOCET) 10-325 MG tablet Take 1 tablet by mouth every 8 (eight) hours as needed for pain.     pregabalin (LYRICA) 75 MG capsule TAKE 1 CAPSULE BY MOUTH TWICE A DAY 180 capsule 0   Probiotic Product (PROBIOTIC PO) Take 1 capsule by mouth daily.  RESTASIS 0.05 % ophthalmic emulsion Place 1 drop into both eyes 2 (two) times daily.      Semaglutide (RYBELSUS) 7 MG TABS Take 7 mg by mouth daily. Via [employer] pt assistance 30 tablet 3   sildenafil (REVATIO) 20 MG tablet Take 40-100 mg by mouth daily as needed.     silodosin (RAPAFLO) 8 MG CAPS capsule Take 8 mg by mouth daily.     telmisartan (MICARDIS) 40 MG tablet Take 1 tablet (40 mg total) by mouth daily. 90 tablet 3   Vibegron (GEMTESA) 75 MG TABS Take 75 mg by mouth daily.     vitamin C (ASCORBIC ACID) 500 MG tablet Take 500 mg by mouth daily.     VITAMIN D, CHOLECALCIFEROL, PO Take 1 capsule by mouth daily.     dapagliflozin propanediol (FARXIGA) 10 MG TABS tablet Take 1  tablet (10 mg total) by mouth daily before breakfast. For diabetes 90 tablet 2   No current facility-administered medications on file prior to visit.     Review of Systems  Constitutional:  Negative for fever.  Respiratory:  Negative for cough, shortness of breath and wheezing.   Cardiovascular:  Positive for leg swelling (ankles). Negative for chest pain and palpitations.  Musculoskeletal:  Positive for arthralgias and back pain.  Neurological:  Negative for light-headedness and headaches.       Objective:   Vitals:   05/20/22 0915  BP: 128/70  Pulse: (!) 51  Temp: 97.8 F (36.6 C)  SpO2: 96%   BP Readings from Last 3 Encounters:  05/20/22 128/70  04/20/22 123/78  02/11/22 (!) 142/58   Wt Readings from Last 3 Encounters:  05/20/22 200 lb (90.7 kg)  04/20/22 200 lb (90.7 kg)  02/11/22 189 lb 6.4 oz (85.9 kg)   Body mass index is 32.28 kg/m.    Physical Exam Constitutional:      General: He is not in acute distress.    Appearance: Normal appearance. He is not ill-appearing.  HENT:     Head: Normocephalic and atraumatic.  Eyes:     Conjunctiva/sclera: Conjunctivae normal.  Cardiovascular:     Rate and Rhythm: Normal rate and regular rhythm.     Heart sounds: Normal heart sounds. No murmur heard. Pulmonary:     Effort: Pulmonary effort is normal. No respiratory distress.     Breath sounds: Normal breath sounds. No wheezing or rales.  Musculoskeletal:     Right lower leg: Edema (trace) present.     Left lower leg: Edema (trace) present.  Skin:    General: Skin is warm and dry.     Findings: No rash.  Neurological:     Mental Status: He is alert. Mental status is at baseline.  Psychiatric:        Mood and Affect: Mood normal.        Lab Results  Component Value Date   WBC 6.3 02/11/2022   HGB 13.2 02/11/2022   HCT 41.0 02/11/2022   PLT 74 (L) 02/11/2022   GLUCOSE 101 (H) 09/08/2021   CHOL 100 07/09/2021   TRIG 126.0 07/09/2021   HDL 39.20  07/09/2021   LDLDIRECT 51.0 08/14/2019   LDLCALC 36 07/09/2021   ALT 37 07/30/2021   AST 21 07/30/2021   NA 143 09/08/2021   K 4.3 09/08/2021   CL 99 09/08/2021   CREATININE 0.91 09/08/2021   BUN 15 09/08/2021   CO2 25 09/08/2021   TSH 4.200 07/30/2021   INR 0.9 03/14/2020  HGBA1C 5.7 (A) 11/06/2021   HGBA1C 5.7 11/06/2021   HGBA1C 5.7 11/06/2021   HGBA1C 5.7 11/06/2021   MICROALBUR 51.6 (H) 07/09/2021     Assessment & Plan:    See Problem List for Assessment and Plan of chronic medical problems.

## 2022-05-20 ENCOUNTER — Ambulatory Visit (INDEPENDENT_AMBULATORY_CARE_PROVIDER_SITE_OTHER): Payer: PPO | Admitting: Internal Medicine

## 2022-05-20 VITALS — BP 128/70 | HR 51 | Temp 97.8°F | Ht 66.0 in | Wt 200.0 lb

## 2022-05-20 DIAGNOSIS — K219 Gastro-esophageal reflux disease without esophagitis: Secondary | ICD-10-CM

## 2022-05-20 DIAGNOSIS — E039 Hypothyroidism, unspecified: Secondary | ICD-10-CM | POA: Diagnosis not present

## 2022-05-20 DIAGNOSIS — I10 Essential (primary) hypertension: Secondary | ICD-10-CM | POA: Diagnosis not present

## 2022-05-20 DIAGNOSIS — F330 Major depressive disorder, recurrent, mild: Secondary | ICD-10-CM

## 2022-05-20 DIAGNOSIS — M109 Gout, unspecified: Secondary | ICD-10-CM

## 2022-05-20 DIAGNOSIS — I4819 Other persistent atrial fibrillation: Secondary | ICD-10-CM

## 2022-05-20 DIAGNOSIS — G4700 Insomnia, unspecified: Secondary | ICD-10-CM | POA: Diagnosis not present

## 2022-05-20 DIAGNOSIS — M797 Fibromyalgia: Secondary | ICD-10-CM | POA: Diagnosis not present

## 2022-05-20 DIAGNOSIS — E1142 Type 2 diabetes mellitus with diabetic polyneuropathy: Secondary | ICD-10-CM

## 2022-05-20 DIAGNOSIS — E782 Mixed hyperlipidemia: Secondary | ICD-10-CM

## 2022-05-20 DIAGNOSIS — G609 Hereditary and idiopathic neuropathy, unspecified: Secondary | ICD-10-CM | POA: Diagnosis not present

## 2022-05-20 DIAGNOSIS — F3289 Other specified depressive episodes: Secondary | ICD-10-CM

## 2022-05-20 DIAGNOSIS — F411 Generalized anxiety disorder: Secondary | ICD-10-CM

## 2022-05-20 DIAGNOSIS — G4733 Obstructive sleep apnea (adult) (pediatric): Secondary | ICD-10-CM

## 2022-05-20 LAB — COMPREHENSIVE METABOLIC PANEL
ALT: 17 U/L (ref 0–53)
AST: 19 U/L (ref 0–37)
Albumin: 4.3 g/dL (ref 3.5–5.2)
Alkaline Phosphatase: 94 U/L (ref 39–117)
BUN: 23 mg/dL (ref 6–23)
CO2: 29 mEq/L (ref 19–32)
Calcium: 9 mg/dL (ref 8.4–10.5)
Chloride: 109 mEq/L (ref 96–112)
Creatinine, Ser: 1.27 mg/dL (ref 0.40–1.50)
GFR: 51 mL/min — ABNORMAL LOW (ref >60.00)
Glucose, Bld: 118 mg/dL — ABNORMAL HIGH (ref 70–99)
Potassium: 4.5 mEq/L (ref 3.5–5.1)
Sodium: 145 mEq/L (ref 135–145)
Total Bilirubin: 0.4 mg/dL (ref 0.2–1.2)
Total Protein: 6.5 g/dL (ref 6.0–8.3)

## 2022-05-20 LAB — HEMOGLOBIN A1C: Hgb A1c MFr Bld: 7.1 % — ABNORMAL HIGH (ref 4.6–6.5)

## 2022-05-20 LAB — LIPID PANEL
Cholesterol: 92 mg/dL (ref 0–200)
HDL: 37.8 mg/dL — ABNORMAL LOW (ref >39.00)
LDL Cholesterol: 43 mg/dL (ref 0–99)
NonHDL: 53.97
Total CHOL/HDL Ratio: 2
Triglycerides: 57 mg/dL (ref 0.0–149.0)
VLDL: 11.4 mg/dL (ref 0.0–40.0)

## 2022-05-20 LAB — CBC WITH DIFFERENTIAL/PLATELET
Basophils Absolute: 0 10*3/uL (ref 0.0–0.1)
Basophils Relative: 0.6 % (ref 0.0–3.0)
Eosinophils Absolute: 0 10*3/uL (ref 0.0–0.7)
Eosinophils Relative: 0.5 % (ref 0.0–5.0)
HCT: 38.2 % — ABNORMAL LOW (ref 39.0–52.0)
Hemoglobin: 12.7 g/dL — ABNORMAL LOW (ref 13.0–17.0)
Lymphocytes Relative: 26.2 % (ref 12.0–46.0)
Lymphs Abs: 1.8 10*3/uL (ref 0.7–4.0)
MCHC: 33.3 g/dL (ref 30.0–36.0)
MCV: 90.7 fl (ref 78.0–100.0)
Monocytes Absolute: 2.6 10*3/uL — ABNORMAL HIGH (ref 0.1–1.0)
Monocytes Relative: 37 % — ABNORMAL HIGH (ref 3.0–12.0)
Neutro Abs: 2.5 10*3/uL (ref 1.4–7.7)
Neutrophils Relative %: 35.7 % — ABNORMAL LOW (ref 43.0–77.0)
Platelets: 74 10*3/uL — ABNORMAL LOW (ref 150.0–400.0)
RBC: 4.21 Mil/uL — ABNORMAL LOW (ref 4.22–5.81)
RDW: 15.3 % (ref 11.5–15.5)
WBC: 6.9 10*3/uL (ref 4.0–10.5)

## 2022-05-20 LAB — TSH: TSH: 34.37 u[IU]/mL — ABNORMAL HIGH (ref 0.35–5.50)

## 2022-05-20 LAB — URIC ACID: Uric Acid, Serum: 5.1 mg/dL (ref 4.0–7.8)

## 2022-05-20 MED ORDER — FLUOXETINE HCL 20 MG PO CAPS: 20.0000 mg | ORAL_CAPSULE | Freq: Every day | ORAL | 1 refills | Status: DC

## 2022-05-20 NOTE — Assessment & Plan Note (Addendum)
Chronic Has been on several medications Currently taking melatonin nightly He has been able to fall asleep, but wakes up in the middle of the night and reads for a while-an hour and a half and he is not able to get back to sleep he will have an alcoholic drink which helps him get back to sleep-discussed that is not ideal and unfortunately has become a bad habit Encouraged increased exercise which may help with his sleep

## 2022-05-20 NOTE — Assessment & Plan Note (Signed)
Chronic Controlled Continue Lyrica 75 mg twice daily

## 2022-05-20 NOTE — Assessment & Plan Note (Signed)
Chronic °Regular exercise and healthy diet encouraged °Check lipid panel  °Continue atorvastatin 20 mg daily °

## 2022-05-20 NOTE — Assessment & Plan Note (Signed)
Chronic Controlled, Stable Continue fluoxetine 20 mg daily 

## 2022-05-20 NOTE — Assessment & Plan Note (Signed)
Chronic GERD controlled - denies gerd Taking omeprazole 20 mg daily - start taking omeprazole 20 mg QOD then three times a week and slowly taper

## 2022-05-20 NOTE — Assessment & Plan Note (Addendum)
Chronic   Lab Results  Component Value Date   HGBA1C 5.7 (A) 11/06/2021   HGBA1C 5.7 11/06/2021   HGBA1C 5.7 11/06/2021   HGBA1C 5.7 11/06/2021   Sugars well controlled Testing sugars 1 times a day Check A1c  Continue Rybelsus if he is taking it-can consider increasing the dose if needed-will see what A1c is Not currently taking Farxiga-awaiting patient assistance which we are working on  Stressed regular exercise, diabetic diet

## 2022-05-20 NOTE — Assessment & Plan Note (Signed)
Chronic Blood pressure well controlled CMP Continue amlodipine 5 mg daily, hydrochlorothiazide 12.5 mg daily, telmisartan 40 mg daily

## 2022-05-20 NOTE — Assessment & Plan Note (Signed)
Chronic Following with cardiology On amiodarone 200 mg daily No longer on Eliquis secondary to recurrent falls

## 2022-05-20 NOTE — Assessment & Plan Note (Signed)
Chronic  Uses cpap nightly with supplemental oxygen

## 2022-05-20 NOTE — Assessment & Plan Note (Signed)
Chronic  Clinically euthyroid Check tsh and will titrate med dose if needed Currently taking levothyroxine 112 mcg daily  

## 2022-05-20 NOTE — Assessment & Plan Note (Signed)
Chronic Denies gout flares Continue allopurinol 300 mg daily

## 2022-05-20 NOTE — Addendum Note (Signed)
Addended by: Binnie Rail on: 05/20/2022 01:02 PM   Modules accepted: Orders

## 2022-05-20 NOTE — Assessment & Plan Note (Signed)
Chronic Idiopathic Continue pregabalin 75 mg twice daily

## 2022-05-21 MED ORDER — LEVOTHYROXINE SODIUM 125 MCG PO TABS: 125.0000 ug | ORAL_TABLET | Freq: Every day | ORAL | 1 refills | Status: DC

## 2022-05-21 NOTE — Addendum Note (Signed)
Addended by: Binnie Rail on: 05/21/2022 05:37 PM   Modules accepted: Orders

## 2022-05-22 ENCOUNTER — Other Ambulatory Visit: Payer: Self-pay | Admitting: Internal Medicine

## 2022-05-29 DIAGNOSIS — M47816 Spondylosis without myelopathy or radiculopathy, lumbar region: Secondary | ICD-10-CM | POA: Diagnosis not present

## 2022-06-03 ENCOUNTER — Other Ambulatory Visit (HOSPITAL_COMMUNITY): Payer: Self-pay | Admitting: Physical Medicine and Rehabilitation

## 2022-06-03 ENCOUNTER — Encounter (HOSPITAL_BASED_OUTPATIENT_CLINIC_OR_DEPARTMENT_OTHER): Payer: Self-pay | Admitting: Physical Medicine and Rehabilitation

## 2022-06-03 DIAGNOSIS — M545 Low back pain, unspecified: Secondary | ICD-10-CM

## 2022-06-09 ENCOUNTER — Ambulatory Visit (HOSPITAL_COMMUNITY)
Admission: RE | Admit: 2022-06-09 | Discharge: 2022-06-09 | Disposition: A | Discharge status: Home / Self Care | Payer: PPO | Source: Ambulatory Visit | Attending: Physical Medicine and Rehabilitation | Admitting: Physical Medicine and Rehabilitation

## 2022-06-09 DIAGNOSIS — M545 Low back pain, unspecified: Secondary | ICD-10-CM | POA: Insufficient documentation

## 2022-06-09 DIAGNOSIS — M48061 Spinal stenosis, lumbar region without neurogenic claudication: Secondary | ICD-10-CM | POA: Diagnosis not present

## 2022-06-13 ENCOUNTER — Other Ambulatory Visit: Payer: Self-pay | Admitting: Internal Medicine

## 2022-06-18 ENCOUNTER — Inpatient Hospital Stay: Payer: Medicare HMO | Attending: Oncology

## 2022-06-18 ENCOUNTER — Encounter: Payer: Self-pay | Admitting: Nurse Practitioner

## 2022-06-18 ENCOUNTER — Inpatient Hospital Stay (HOSPITAL_BASED_OUTPATIENT_CLINIC_OR_DEPARTMENT_OTHER): Payer: Medicare HMO | Admitting: Nurse Practitioner

## 2022-06-18 VITALS — BP 144/58 | HR 60 | Temp 98.1°F | Resp 20 | Ht 66.0 in | Wt 201.2 lb

## 2022-06-18 DIAGNOSIS — D696 Thrombocytopenia, unspecified: Secondary | ICD-10-CM

## 2022-06-18 DIAGNOSIS — H409 Unspecified glaucoma: Secondary | ICD-10-CM | POA: Insufficient documentation

## 2022-06-18 DIAGNOSIS — G629 Polyneuropathy, unspecified: Secondary | ICD-10-CM | POA: Diagnosis not present

## 2022-06-18 DIAGNOSIS — E119 Type 2 diabetes mellitus without complications: Secondary | ICD-10-CM | POA: Diagnosis not present

## 2022-06-18 DIAGNOSIS — I4891 Unspecified atrial fibrillation: Secondary | ICD-10-CM | POA: Diagnosis not present

## 2022-06-18 DIAGNOSIS — F419 Anxiety disorder, unspecified: Secondary | ICD-10-CM | POA: Insufficient documentation

## 2022-06-18 DIAGNOSIS — M797 Fibromyalgia: Secondary | ICD-10-CM | POA: Diagnosis not present

## 2022-06-18 DIAGNOSIS — G4730 Sleep apnea, unspecified: Secondary | ICD-10-CM | POA: Diagnosis not present

## 2022-06-18 DIAGNOSIS — R296 Repeated falls: Secondary | ICD-10-CM | POA: Insufficient documentation

## 2022-06-18 DIAGNOSIS — G96191 Perineural cyst: Secondary | ICD-10-CM | POA: Diagnosis not present

## 2022-06-18 DIAGNOSIS — R69 Illness, unspecified: Secondary | ICD-10-CM | POA: Diagnosis not present

## 2022-06-18 DIAGNOSIS — E039 Hypothyroidism, unspecified: Secondary | ICD-10-CM | POA: Insufficient documentation

## 2022-06-18 DIAGNOSIS — I10 Essential (primary) hypertension: Secondary | ICD-10-CM | POA: Diagnosis not present

## 2022-06-18 DIAGNOSIS — M109 Gout, unspecified: Secondary | ICD-10-CM | POA: Insufficient documentation

## 2022-06-18 LAB — CBC WITH DIFFERENTIAL (CANCER CENTER ONLY)
Abs Immature Granulocytes: 0.33 10*3/uL — ABNORMAL HIGH (ref 0.00–0.07)
Basophils Absolute: 0 10*3/uL (ref 0.0–0.1)
Basophils Relative: 1 %
Eosinophils Absolute: 0 10*3/uL (ref 0.0–0.5)
Eosinophils Relative: 0 %
HCT: 39.2 % (ref 39.0–52.0)
Hemoglobin: 12.6 g/dL — ABNORMAL LOW (ref 13.0–17.0)
Immature Granulocytes: 4 %
Lymphocytes Relative: 23 %
Lymphs Abs: 1.7 10*3/uL (ref 0.7–4.0)
MCH: 29.9 pg (ref 26.0–34.0)
MCHC: 32.1 g/dL (ref 30.0–36.0)
MCV: 93.1 fL (ref 80.0–100.0)
Monocytes Absolute: 2.9 10*3/uL — ABNORMAL HIGH (ref 0.1–1.0)
Monocytes Relative: 38 %
Neutro Abs: 2.6 10*3/uL (ref 1.7–7.7)
Neutrophils Relative %: 34 %
Platelet Count: 62 10*3/uL — ABNORMAL LOW (ref 150–400)
RBC: 4.21 MIL/uL — ABNORMAL LOW (ref 4.22–5.81)
RDW: 14.3 % (ref 11.5–15.5)
WBC Count: 7.6 10*3/uL (ref 4.0–10.5)
nRBC: 0 % (ref 0.0–0.2)

## 2022-06-18 NOTE — Progress Notes (Signed)
  [facility] OFFICE PROGRESS NOTE   Diagnosis: Thrombocytopenia  INTERVAL HISTORY:   Mr. Knupp returns as scheduled.  He has an occasional nosebleed.  No other bleeding.  He has had several "near falls".  No interim illnesses or infections.  Appetite is unchanged.  No weight loss.  No fevers or sweats.  Objective:  Vital signs in last 24 hours:  Blood pressure (!) 144/58, pulse 60, temperature 98.1 F (36.7 C), temperature source Oral, resp. rate 20, height '5\' 6"'$  (1.676 m), weight 201 lb 3.2 oz (91.3 kg), SpO2 98 %.    Lymphatics: No palpable cervical, supraclavicular or axillary lymph nodes. Resp: Lungs clear bilaterally. Cardio: Regular rate and rhythm. GI: No hepatosplenomegaly.  No apparent ascites. Vascular: No leg edema.   Lab Results:  Lab Results  Component Value Date   WBC 7.6 06/18/2022   HGB 12.6 (L) 06/18/2022   HCT 39.2 06/18/2022   MCV 93.1 06/18/2022   PLT 62 (L) 06/18/2022   NEUTROABS 2.6 06/18/2022    Imaging:  No results found.  Medications: I have reviewed the patient's current medications.  Assessment/Plan: Thrombocytopenia-chronic   Atrial fibrillation-apixaban discontinued May 2023 due to recurrent falls/syncope Hypertension Fibromyalgia Diabetes Hypothyroidism Anxiety Gout Neuropathy Sleep apnea-CPAP Glaucoma Mild elevation of monocyte count Small cutaneous cyst at the perineum  Disposition: Mr. Kassa is overall stable from a hematologic standpoint.  Platelet count is slightly lower than 4 months ago.  He understands to contact the office with spontaneous bruising/bleeding.  Plan at present is for continued observation.  We discussed potential etiologies of the thrombocytopenia including ITP, myelodysplasia, myeloproliferative disorder.  He will return for a CBC and follow-up visit in 4 months.  He will contact the office in the interim as outlined above or with any other problems.    Ned Card ANP/GNP-BC    06/18/2022  11:30 AM

## 2022-06-22 ENCOUNTER — Ambulatory Visit (INDEPENDENT_AMBULATORY_CARE_PROVIDER_SITE_OTHER): Payer: Medicare HMO

## 2022-06-22 DIAGNOSIS — R55 Syncope and collapse: Secondary | ICD-10-CM | POA: Diagnosis not present

## 2022-06-23 LAB — CUP PACEART REMOTE DEVICE CHECK
Date Time Interrogation Session: 20240107231706
Implantable Pulse Generator Implant Date: 20230612

## 2022-06-25 NOTE — Progress Notes (Signed)
Carelink Summary Report / Loop Recorder 

## 2022-06-29 ENCOUNTER — Other Ambulatory Visit: Payer: Self-pay | Admitting: Internal Medicine

## 2022-06-29 ENCOUNTER — Telehealth: Payer: Self-pay

## 2022-06-29 NOTE — Telephone Encounter (Signed)
I receive letter of Pending out come  for PAP for RYBELSUS  FROM [place] [place]  due to Income was missing and a copy of insurance card. I called pt to verify the information and ask him to bring a copy of income into your office. Please upon receiving this information could you or your staff Fax to [phone] ATTENTION Leldon Steege  Thank You,  [facility] Patient Advocate

## 2022-06-30 ENCOUNTER — Telehealth: Payer: Self-pay | Admitting: Internal Medicine

## 2022-06-30 NOTE — Telephone Encounter (Signed)
PT visits today with some forms for Dr.Burns and Carla. Forms have been placed in an envelop and left in Dr.Burns' mailbox.  CB if needed: [phone]

## 2022-07-01 NOTE — Telephone Encounter (Signed)
Paperwork faxed yesterday to [facility] with Rx Auth team.

## 2022-07-01 NOTE — Telephone Encounter (Signed)
Thanks Angela Nevin, I received income info and in resubmitting to [employer] for further processing .   [facility] Patient Advocate

## 2022-07-02 ENCOUNTER — Encounter: Payer: Self-pay | Admitting: Internal Medicine

## 2022-07-02 ENCOUNTER — Other Ambulatory Visit (INDEPENDENT_AMBULATORY_CARE_PROVIDER_SITE_OTHER): Payer: Medicare HMO

## 2022-07-02 DIAGNOSIS — E039 Hypothyroidism, unspecified: Secondary | ICD-10-CM

## 2022-07-02 LAB — TSH: TSH: 26.26 u[IU]/mL — ABNORMAL HIGH (ref 0.35–5.50)

## 2022-07-02 MED ORDER — LEVOTHYROXINE SODIUM 150 MCG PO TABS: 150.0000 ug | ORAL_TABLET | Freq: Every day | ORAL | 1 refills | Status: DC

## 2022-07-03 ENCOUNTER — Other Ambulatory Visit: Payer: Self-pay | Admitting: Cardiology

## 2022-07-06 NOTE — Telephone Encounter (Signed)
Received notification from [facility] [place] regarding approval for RYBELSUS. Patient assistance approved from 07/02/2022 to 06/15/2023.  Phone: [facility] Patient Advocate

## 2022-07-06 NOTE — Telephone Encounter (Signed)
SCANNED IN LETTER FOR APPROVAL TO MEDIA [facility] Patient Advocate

## 2022-07-15 ENCOUNTER — Encounter: Payer: Self-pay | Admitting: Internal Medicine

## 2022-07-16 ENCOUNTER — Encounter: Payer: Self-pay | Admitting: Internal Medicine

## 2022-07-16 NOTE — Progress Notes (Signed)
Subjective:    Patient ID: Jonathan [name], male    DOB: 19-Sep-1932, 89 y.o.   MRN: [id number]      HPI [place] is here for  Chief Complaint  Patient presents with   Cough    Cough x 2 weeks (brown to thick yellow mucus)    He is here for an acute visit for cold symptoms.  His symptoms started a couple of weeks ago   He is experiencing nasal congestion, postnasal drip, some sinus pressure and productive cough.  The mucus with the cough started out as brown and now is primarily yellow.  He denies any fever, shortness of breath or wheezing.  The symptoms did not seem to be getting better, which is why he is here   He has tried taking mucinex   Covid tests at home have been negative x 2    Medications and allergies reviewed with patient and updated if appropriate.  Current Outpatient Medications on File Prior to Visit  Medication Sig Dispense Refill   acetaminophen (TYLENOL) 650 MG CR tablet Take 1,300 mg by mouth in the morning and at bedtime.     acyclovir ointment (ZOVIRAX) 5 % Apply 1 application topically as needed.     allopurinol (ZYLOPRIM) 300 MG tablet Take 1 tablet (300 mg total) by mouth every evening. For gout 90 tablet 3   amiodarone (PACERONE) 200 MG tablet Take 200 mg by mouth daily.     amLODipine (NORVASC) 5 MG tablet TAKE 1 TABLET (5 MG TOTAL) BY MOUTH DAILY. 90 tablet 2   atorvastatin (LIPITOR) 20 MG tablet TAKE 1 TABLET BY MOUTH EVERY DAY FOR CHOLESTEROL 90 tablet 1   blood glucose meter kit and supplies KIT Dispense based on patient and insurance preference. Use daily as directed. (FOR E11.9). 1 each 0   Calcium Citrate-Vitamin D (CALCIUM + D PO) Take 1 tablet by mouth daily.     carboxymethylcellulose (REFRESH PLUS) 0.5 % SOLN Place 1 drop into both eyes 3 (three) times daily as needed (dry eyes).     Emollient (RA RENEWAL DARK SPOT CORRECTOR EX) Apply 1 application topically daily as needed (dark spots).     FARXIGA 10 MG TABS tablet Take 10 mg by  mouth every morning.     FLUoxetine (PROZAC) 20 MG capsule Take 1 capsule (20 mg total) by mouth daily. 90 capsule 1   Fluticasone Propionate (ALLERGY RELIEF NA) Place 2 sprays into the nose daily as needed (allergies).     hydrochlorothiazide (HYDRODIURIL) 12.5 MG tablet TAKE 1 TABLET BY MOUTH EVERY DAY 90 tablet 1   Lancets MISC Use to check blood sugars daily. E11.9 100 each 3   latanoprost (XALATAN) 0.005 % ophthalmic solution Place 1 drop into both eyes at bedtime.     levothyroxine (SYNTHROID) 150 MCG tablet Take 1 tablet (150 mcg total) by mouth daily. 90 tablet 1   Lido-Menthol-Methyl Sal-Camph (CBD KINGS EX) Apply topically as needed.     Melatonin 5 MG TABS Take 5 mg by mouth at bedtime.     Multiple Vitamin (MULTIVITAMIN) tablet Take 1 tablet by mouth daily.     nitroGLYCERIN (NITROSTAT) 0.4 MG SL tablet Place 1 tablet (0.4 mg total) under the tongue every 5 (five) minutes as needed for chest pain. 90 tablet 3   omeprazole (PRILOSEC) 20 MG capsule TAKE 1 CAPSULE BY MOUTH EVERY DAY 90 capsule 1   oxyCODONE-acetaminophen (PERCOCET) 10-325 MG tablet Take 1 tablet by mouth every 8 (eight)  hours as needed for pain.     pregabalin (LYRICA) 75 MG capsule TAKE 1 CAPSULE BY MOUTH TWICE A DAY 180 capsule 0   Probiotic Product (PROBIOTIC PO) Take 1 capsule by mouth daily.     RESTASIS 0.05 % ophthalmic emulsion Place 1 drop into both eyes 2 (two) times daily.      Semaglutide (RYBELSUS) 7 MG TABS Take 7 mg by mouth daily. [employer] pt assistance 30 tablet 3   sildenafil (REVATIO) 20 MG tablet Take 40-100 mg by mouth daily as needed.     silodosin (RAPAFLO) 8 MG CAPS capsule Take 8 mg by mouth daily.     telmisartan (MICARDIS) 40 MG tablet Take 1 tablet (40 mg total) by mouth daily. 90 tablet 3   Vibegron (GEMTESA) 75 MG TABS Take 75 mg by mouth daily.     vitamin C (ASCORBIC ACID) 500 MG tablet Take 500 mg by mouth daily.     VITAMIN D, CHOLECALCIFEROL, PO Take 1 capsule by mouth daily.      No current facility-administered medications on file prior to visit.    Review of Systems  Constitutional:  Negative for chills and fever.  HENT:  Positive for congestion (some), postnasal drip and sinus pressure. Negative for ear pain, sinus pain and sore throat.   Respiratory:  Positive for cough (productive - initially sputum was brown and now dark yellow). Negative for shortness of breath and wheezing.   Gastrointestinal:  Negative for diarrhea and nausea.  Neurological:  Negative for dizziness, light-headedness and headaches.       Objective:   Vitals:   07/17/22 0957  BP: 138/68  Pulse: 80  Temp: 97.9 F (36.6 C)  SpO2: 91%   BP Readings from Last 3 Encounters:  07/17/22 138/68  06/18/22 (!) 144/58  05/20/22 128/70   Wt Readings from Last 3 Encounters:  07/17/22 192 lb (87.1 kg)  06/18/22 201 lb 3.2 oz (91.3 kg)  05/20/22 200 lb (90.7 kg)   Body mass index is 30.99 kg/m.    Physical Exam Constitutional:      General: He is not in acute distress.    Appearance: Normal appearance. He is not ill-appearing.  HENT:     Head: Normocephalic.     Right Ear: Tympanic membrane, ear canal and external ear normal. There is no impacted cerumen.     Left Ear: Tympanic membrane, ear canal and external ear normal. There is no impacted cerumen.     Mouth/Throat:     Mouth: Mucous membranes are moist.     Pharynx: No oropharyngeal exudate or posterior oropharyngeal erythema.  Eyes:     Conjunctiva/sclera: Conjunctivae normal.  Cardiovascular:     Rate and Rhythm: Normal rate and regular rhythm.  Pulmonary:     Effort: Pulmonary effort is normal. No respiratory distress.     Breath sounds: Normal breath sounds. No wheezing or rales.  Musculoskeletal:     Cervical back: Neck supple. No tenderness.  Lymphadenopathy:     Cervical: No cervical adenopathy.  Skin:    General: Skin is warm and dry.     Findings: No rash.  Neurological:     Mental Status: He is alert.             Assessment & Plan:    See Problem List for Assessment and Plan of chronic medical problems.

## 2022-07-17 ENCOUNTER — Ambulatory Visit (INDEPENDENT_AMBULATORY_CARE_PROVIDER_SITE_OTHER): Payer: Medicare HMO | Admitting: Internal Medicine

## 2022-07-17 VITALS — BP 138/68 | HR 80 | Temp 97.9°F | Ht 66.0 in | Wt 192.0 lb

## 2022-07-17 DIAGNOSIS — E1142 Type 2 diabetes mellitus with diabetic polyneuropathy: Secondary | ICD-10-CM | POA: Diagnosis not present

## 2022-07-17 DIAGNOSIS — I10 Essential (primary) hypertension: Secondary | ICD-10-CM | POA: Diagnosis not present

## 2022-07-17 DIAGNOSIS — J209 Acute bronchitis, unspecified: Secondary | ICD-10-CM | POA: Diagnosis not present

## 2022-07-17 MED ORDER — AMOXICILLIN-POT CLAVULANATE 875-125 MG PO TABS: 1.0000 | ORAL_TABLET | Freq: Two times a day (BID) | ORAL | 0 refills | Status: DC

## 2022-07-17 NOTE — Patient Instructions (Addendum)
        Medications changes include :   Augmentin twice daily for 10 days     Return if symptoms worsen or fail to improve.

## 2022-07-17 NOTE — Assessment & Plan Note (Signed)
Chronic Blood pressure adequately controlled Okay to continue over-the-counter cold medications Continue current blood pressure medications including amlodipine 5 mg daily, HCTZ 12.5 mg daily and telmisartan 40 mg daily

## 2022-07-17 NOTE — Assessment & Plan Note (Signed)
Acute Likely bacterial  Start Augmentin 875-125 mg BID x 10 day otc cold medications Rest, fluid Call if no improvement  

## 2022-07-17 NOTE — Assessment & Plan Note (Signed)
Chronic Lab Results  Component Value Date   HGBA1C 7.1 (H) 05/20/2022   Sugars fairly controlled-not checking sugars at home Continue Farxiga 10 mg daily, Rybelsus 7 mg daily

## 2022-07-22 DIAGNOSIS — N401 Enlarged prostate with lower urinary tract symptoms: Secondary | ICD-10-CM | POA: Diagnosis not present

## 2022-07-22 DIAGNOSIS — R3912 Poor urinary stream: Secondary | ICD-10-CM | POA: Diagnosis not present

## 2022-07-22 DIAGNOSIS — R350 Frequency of micturition: Secondary | ICD-10-CM | POA: Diagnosis not present

## 2022-07-26 LAB — CUP PACEART REMOTE DEVICE CHECK
Date Time Interrogation Session: 20240209231219
Implantable Pulse Generator Implant Date: 20230612

## 2022-07-27 ENCOUNTER — Ambulatory Visit: Payer: Medicare HMO

## 2022-07-27 DIAGNOSIS — R55 Syncope and collapse: Secondary | ICD-10-CM

## 2022-07-27 NOTE — Progress Notes (Signed)
Carelink Summary Report / Loop Recorder 

## 2022-07-30 ENCOUNTER — Other Ambulatory Visit: Payer: Self-pay | Admitting: Internal Medicine

## 2022-08-03 NOTE — Progress Notes (Unsigned)
Subjective:    Patient ID: Jonathan [name], male    DOB: [birth year], 89 y.o.   MRN: [id number]      HPI Jonathan [name] is here for No chief complaint on file.    Lethargy, hypothyroidism -     Medications and allergies reviewed with patient and updated if appropriate.  Current Outpatient Medications on File Prior to Visit  Medication Sig Dispense Refill   acetaminophen (TYLENOL) 650 MG CR tablet Take 1,300 mg by mouth in the morning and at bedtime.     acyclovir ointment (ZOVIRAX) 5 % Apply 1 application topically as needed.     allopurinol (ZYLOPRIM) 300 MG tablet Take 1 tablet (300 mg total) by mouth every evening. For gout 90 tablet 3   amiodarone (PACERONE) 200 MG tablet Take 200 mg by mouth daily.     amLODipine (NORVASC) 5 MG tablet TAKE 1 TABLET (5 MG TOTAL) BY MOUTH DAILY. 90 tablet 2   amoxicillin-clavulanate (AUGMENTIN) 875-125 MG tablet Take 1 tablet by mouth 2 (two) times daily. 20 tablet 0   atorvastatin (LIPITOR) 20 MG tablet TAKE 1 TABLET BY MOUTH EVERY DAY FOR CHOLESTEROL 90 tablet 1   blood glucose meter kit and supplies KIT Dispense based on patient and insurance preference. Use daily as directed. (FOR E11.9). 1 each 0   Calcium Citrate-Vitamin D (CALCIUM + D PO) Take 1 tablet by mouth daily.     carboxymethylcellulose (REFRESH PLUS) 0.5 % SOLN Place 1 drop into both eyes 3 (three) times daily as needed (dry eyes).     dapagliflozin propanediol (FARXIGA) 10 MG TABS tablet TAKE 1 TABLET (10 MG TOTAL) BY MOUTH DAILY BEFORE BREAKFAST. FOR DIABETES 90 tablet 1   Emollient (RA RENEWAL DARK SPOT CORRECTOR EX) Apply 1 application topically daily as needed (dark spots).     FLUoxetine (PROZAC) 20 MG capsule Take 1 capsule (20 mg total) by mouth daily. 90 capsule 1   Fluticasone Propionate (ALLERGY RELIEF NA) Place 2 sprays into the nose daily as needed (allergies).     hydrochlorothiazide (HYDRODIURIL) 12.5 MG tablet TAKE 1 TABLET BY MOUTH EVERY DAY 90 tablet 1   Lancets  MISC Use to check blood sugars daily. E11.9 100 each 3   latanoprost (XALATAN) 0.005 % ophthalmic solution Place 1 drop into both eyes at bedtime.     levothyroxine (SYNTHROID) 150 MCG tablet Take 1 tablet (150 mcg total) by mouth daily. 90 tablet 1   Lido-Menthol-Methyl Sal-Camph (CBD KINGS EX) Apply topically as needed.     Melatonin 5 MG TABS Take 5 mg by mouth at bedtime.     Multiple Vitamin (MULTIVITAMIN) tablet Take 1 tablet by mouth daily.     nitroGLYCERIN (NITROSTAT) 0.4 MG SL tablet Place 1 tablet (0.4 mg total) under the tongue every 5 (five) minutes as needed for chest pain. 90 tablet 3   omeprazole (PRILOSEC) 20 MG capsule TAKE 1 CAPSULE BY MOUTH EVERY DAY 90 capsule 1   oxyCODONE-acetaminophen (PERCOCET) 10-325 MG tablet Take 1 tablet by mouth every 8 (eight) hours as needed for pain.     pregabalin (LYRICA) 75 MG capsule TAKE 1 CAPSULE BY MOUTH TWICE A DAY 180 capsule 0   Probiotic Product (PROBIOTIC PO) Take 1 capsule by mouth daily.     RESTASIS 0.05 % ophthalmic emulsion Place 1 drop into both eyes 2 (two) times daily.      Semaglutide (RYBELSUS) 7 MG TABS Take 7 mg by mouth daily. [employer] pt assistance 30  tablet 3   sildenafil (REVATIO) 20 MG tablet Take 40-100 mg by mouth daily as needed.     silodosin (RAPAFLO) 8 MG CAPS capsule Take 8 mg by mouth daily.     telmisartan (MICARDIS) 40 MG tablet Take 1 tablet (40 mg total) by mouth daily. 90 tablet 3   Vibegron (GEMTESA) 75 MG TABS Take 75 mg by mouth daily.     vitamin C (ASCORBIC ACID) 500 MG tablet Take 500 mg by mouth daily.     VITAMIN D, CHOLECALCIFEROL, PO Take 1 capsule by mouth daily.     No current facility-administered medications on file prior to visit.    Review of Systems     Objective:  There were no vitals filed for this visit. BP Readings from Last 3 Encounters:  07/17/22 138/68  06/18/22 (!) 144/58  05/20/22 128/70   Wt Readings from Last 3 Encounters:  07/17/22 192 lb (87.1 kg)   06/18/22 201 lb 3.2 oz (91.3 kg)  05/20/22 200 lb (90.7 kg)   There is no height or weight on file to calculate BMI.    Physical Exam         Assessment & Plan:    See Problem List for Assessment and Plan of chronic medical problems.

## 2022-08-04 ENCOUNTER — Encounter: Payer: Self-pay | Admitting: Internal Medicine

## 2022-08-04 ENCOUNTER — Ambulatory Visit (INDEPENDENT_AMBULATORY_CARE_PROVIDER_SITE_OTHER): Payer: Medicare HMO | Admitting: Internal Medicine

## 2022-08-04 VITALS — BP 132/72 | HR 80 | Temp 98.1°F | Ht 66.0 in | Wt 198.0 lb

## 2022-08-04 DIAGNOSIS — M797 Fibromyalgia: Secondary | ICD-10-CM | POA: Diagnosis not present

## 2022-08-04 DIAGNOSIS — R5381 Other malaise: Secondary | ICD-10-CM | POA: Diagnosis not present

## 2022-08-04 DIAGNOSIS — M47816 Spondylosis without myelopathy or radiculopathy, lumbar region: Secondary | ICD-10-CM

## 2022-08-04 DIAGNOSIS — R69 Illness, unspecified: Secondary | ICD-10-CM | POA: Diagnosis not present

## 2022-08-04 DIAGNOSIS — F3289 Other specified depressive episodes: Secondary | ICD-10-CM | POA: Diagnosis not present

## 2022-08-04 DIAGNOSIS — E039 Hypothyroidism, unspecified: Secondary | ICD-10-CM

## 2022-08-04 MED ORDER — FLUOXETINE HCL 40 MG PO CAPS: 40.0000 mg | ORAL_CAPSULE | Freq: Every day | ORAL | 3 refills | Status: DC

## 2022-08-04 NOTE — Patient Instructions (Addendum)
      Have your thyroid level rechecked in about 4 weeks.     Medications changes include :   increase fluoxetine to 40 mg daily    A referral was ordered for physical therapy.     Someone will call you to schedule an appointment.     Return in about 6 weeks (around 09/15/2022) for follow up.

## 2022-08-04 NOTE — Assessment & Plan Note (Signed)
Chronic Needs to be exercising more  - [facility] Advised to start exercising more  - can do classes, swim, walk

## 2022-08-04 NOTE — Assessment & Plan Note (Signed)
Chronic Back is tolerable Would benefit from more exercise  - [facility]

## 2022-08-04 NOTE — Assessment & Plan Note (Signed)
Chronic Not controlled - some of his fatigue and anhedonia is likely related to depression Increase prozac to 40 mg daily

## 2022-08-04 NOTE — Assessment & Plan Note (Signed)
Chronic Has been taking his medication for years with coffee, but in the past several months at some point he started taking the levothyroxine with [place] and Rybelsus which may have been the cause of his TSH being so high Medication dose was increased few weeks ago He will try to start taking the levothyroxine earlier-May be in the middle of the night when he gets up to go to the bathroom Recheck TSH in about 4 weeks

## 2022-08-04 NOTE — Assessment & Plan Note (Signed)
Chronic Needs to be exercising more  - [facility]

## 2022-08-13 ENCOUNTER — Encounter: Payer: Self-pay | Admitting: Internal Medicine

## 2022-08-15 DIAGNOSIS — M47816 Spondylosis without myelopathy or radiculopathy, lumbar region: Secondary | ICD-10-CM | POA: Diagnosis not present

## 2022-08-15 DIAGNOSIS — M546 Pain in thoracic spine: Secondary | ICD-10-CM | POA: Diagnosis not present

## 2022-08-15 DIAGNOSIS — M5136 Other intervertebral disc degeneration, lumbar region: Secondary | ICD-10-CM | POA: Diagnosis not present

## 2022-08-22 DIAGNOSIS — M546 Pain in thoracic spine: Secondary | ICD-10-CM | POA: Diagnosis not present

## 2022-08-24 ENCOUNTER — Encounter: Payer: Self-pay | Admitting: Internal Medicine

## 2022-08-24 DIAGNOSIS — R600 Localized edema: Secondary | ICD-10-CM

## 2022-08-25 ENCOUNTER — Encounter: Payer: Self-pay | Admitting: Internal Medicine

## 2022-08-25 ENCOUNTER — Ambulatory Visit (INDEPENDENT_AMBULATORY_CARE_PROVIDER_SITE_OTHER): Payer: Medicare HMO | Admitting: Internal Medicine

## 2022-08-25 VITALS — BP 122/78 | HR 63 | Temp 98.4°F | Ht 66.0 in | Wt 201.0 lb

## 2022-08-25 DIAGNOSIS — K1120 Sialoadenitis, unspecified: Secondary | ICD-10-CM

## 2022-08-25 DIAGNOSIS — K122 Cellulitis and abscess of mouth: Secondary | ICD-10-CM | POA: Insufficient documentation

## 2022-08-25 MED ORDER — CEPHALEXIN 500 MG PO CAPS: 500.0000 mg | ORAL_CAPSULE | Freq: Three times a day (TID) | ORAL | 0 refills | Status: DC

## 2022-08-25 NOTE — Progress Notes (Signed)
Subjective:    Patient ID: Jonathan [name], male    DOB: Jun 14, 1933, 89 y.o.   MRN: [id number]      HPI Eleuterio is here for  Chief Complaint  Patient presents with   lymph node     Along the jaw noticed 3 days ago and is painful and pain in right arm started around same time    About 3 days ago noticed swelling in left jaw - started out as the size of a marble- seemed to get smaller then got much larger.  It is tender to touch.  No redness or warmth.    He has been massaging the area and applying heat.  He has not noticed any worsening in the last day or 2, but there has been no improvement either.     Medications and allergies reviewed with patient and updated if appropriate.  Current Outpatient Medications on File Prior to Visit  Medication Sig Dispense Refill   acetaminophen (TYLENOL) 650 MG CR tablet Take 1,300 mg by mouth in the morning and at bedtime.     acyclovir ointment (ZOVIRAX) 5 % Apply 1 application topically as needed.     allopurinol (ZYLOPRIM) 300 MG tablet Take 1 tablet (300 mg total) by mouth every evening. For gout 90 tablet 3   amiodarone (PACERONE) 200 MG tablet Take 200 mg by mouth daily.     amLODipine (NORVASC) 5 MG tablet TAKE 1 TABLET (5 MG TOTAL) BY MOUTH DAILY. 90 tablet 2   atorvastatin (LIPITOR) 20 MG tablet TAKE 1 TABLET BY MOUTH EVERY DAY FOR CHOLESTEROL 90 tablet 1   blood glucose meter kit and supplies KIT Dispense based on patient and insurance preference. Use daily as directed. (FOR E11.9). 1 each 0   Calcium Citrate-Vitamin D (CALCIUM + D PO) Take 1 tablet by mouth daily.     carboxymethylcellulose (REFRESH PLUS) 0.5 % SOLN Place 1 drop into both eyes 3 (three) times daily as needed (dry eyes).     dapagliflozin propanediol (FARXIGA) 10 MG TABS tablet TAKE 1 TABLET (10 MG TOTAL) BY MOUTH DAILY BEFORE BREAKFAST. FOR DIABETES 90 tablet 1   Emollient (RA RENEWAL DARK SPOT CORRECTOR EX) Apply 1 application topically daily as needed (dark  spots).     FLUoxetine (PROZAC) 40 MG capsule Take 1 capsule (40 mg total) by mouth daily. 90 capsule 3   Fluticasone Propionate (ALLERGY RELIEF NA) Place 2 sprays into the nose daily as needed (allergies).     hydrochlorothiazide (HYDRODIURIL) 12.5 MG tablet TAKE 1 TABLET BY MOUTH EVERY DAY 90 tablet 1   Lancets MISC Use to check blood sugars daily. E11.9 100 each 3   latanoprost (XALATAN) 0.005 % ophthalmic solution Place 1 drop into both eyes at bedtime.     levothyroxine (SYNTHROID) 150 MCG tablet Take 1 tablet (150 mcg total) by mouth daily. 90 tablet 1   Lido-Menthol-Methyl Sal-Camph (CBD KINGS EX) Apply topically as needed.     Melatonin 5 MG TABS Take 5 mg by mouth at bedtime.     Multiple Vitamin (MULTIVITAMIN) tablet Take 1 tablet by mouth daily.     nitroGLYCERIN (NITROSTAT) 0.4 MG SL tablet Place 1 tablet (0.4 mg total) under the tongue every 5 (five) minutes as needed for chest pain. 90 tablet 3   omeprazole (PRILOSEC) 20 MG capsule TAKE 1 CAPSULE BY MOUTH EVERY DAY 90 capsule 1   oxyCODONE-acetaminophen (PERCOCET) 10-325 MG tablet Take 1 tablet by mouth every 8 (eight) hours as  needed for pain.     pregabalin (LYRICA) 75 MG capsule TAKE 1 CAPSULE BY MOUTH TWICE A DAY 180 capsule 0   Probiotic Product (PROBIOTIC PO) Take 1 capsule by mouth daily.     RESTASIS 0.05 % ophthalmic emulsion Place 1 drop into both eyes 2 (two) times daily.      Semaglutide (RYBELSUS) 7 MG TABS Take 7 mg by mouth daily. Via [employer] pt assistance 30 tablet 3   sildenafil (REVATIO) 20 MG tablet Take 40-100 mg by mouth daily as needed.     silodosin (RAPAFLO) 8 MG CAPS capsule Take 8 mg by mouth daily.     telmisartan (MICARDIS) 40 MG tablet Take 1 tablet (40 mg total) by mouth daily. 90 tablet 3   Vibegron (GEMTESA) 75 MG TABS Take 75 mg by mouth daily.     vitamin C (ASCORBIC ACID) 500 MG tablet Take 500 mg by mouth daily.     VITAMIN D, CHOLECALCIFEROL, PO Take 1 capsule by mouth daily.     No  current facility-administered medications on file prior to visit.    Review of Systems  Constitutional:  Negative for chills and fever.  HENT:  Negative for trouble swallowing.   Neurological:  Negative for dizziness, light-headedness and headaches.       Objective:   Vitals:   08/25/22 1446  BP: 122/78  Pulse: 63  Temp: 98.4 F (36.9 C)  SpO2: 94%   BP Readings from Last 3 Encounters:  08/25/22 122/78  08/04/22 132/72  07/17/22 138/68   Wt Readings from Last 3 Encounters:  08/25/22 201 lb (91.2 kg)  08/04/22 198 lb (89.8 kg)  07/17/22 192 lb (87.1 kg)   Body mass index is 32.44 kg/m.    Physical Exam Constitutional:      General: He is not in acute distress.    Appearance: Normal appearance. He is not ill-appearing.  HENT:     Head: Normocephalic and atraumatic.     Mouth/Throat:     Mouth: Mucous membranes are moist.     Pharynx: No oropharyngeal exudate or posterior oropharyngeal erythema.  Eyes:     Conjunctiva/sclera: Conjunctivae normal.  Neck:     Comments: Left submandibular gland swollen-very visible without palpation approximately the size of a golf ball-mildly tender to palpation, slight warmth overlying area, mild erythema lower portion of the gland into the neck Musculoskeletal:     Cervical back: Neck supple.  Lymphadenopathy:     Cervical: No cervical adenopathy.  Neurological:     Mental Status: He is alert.            Assessment & Plan:    See Problem List for Assessment and Plan of chronic medical problems.

## 2022-08-25 NOTE — Assessment & Plan Note (Signed)
Acute Symptoms started 3 days ago and quickly got worse-gland is much larger, tender and slightly warm Likely obstruction secondary to stone with developing infection Start cephalexin 500 mg 3 times daily x 10 days Continue massaging the gland, doing warm compresses Increase daily water intake/fluids okay Advised him to suck on sour candy Referral ordered for ENT If no improvement in the next 24-48 hours will obtain a CT scan

## 2022-08-25 NOTE — Patient Instructions (Addendum)
        Medications changes include : keflex  three times a day for 10 days    A referral was ordered for ENT.     Someone will call you to schedule an appointment.    Return if symptoms worsen or fail to improve.

## 2022-08-26 ENCOUNTER — Ambulatory Visit: Payer: Medicare HMO | Admitting: Internal Medicine

## 2022-08-28 ENCOUNTER — Ambulatory Visit
Admission: RE | Admit: 2022-08-28 | Discharge: 2022-08-28 | Disposition: A | Discharge status: Home / Self Care | Payer: Medicare HMO | Source: Ambulatory Visit | Attending: Internal Medicine | Admitting: Internal Medicine

## 2022-08-28 DIAGNOSIS — M542 Cervicalgia: Secondary | ICD-10-CM | POA: Diagnosis not present

## 2022-08-28 DIAGNOSIS — R221 Localized swelling, mass and lump, neck: Secondary | ICD-10-CM | POA: Diagnosis not present

## 2022-08-28 DIAGNOSIS — R600 Localized edema: Secondary | ICD-10-CM

## 2022-08-28 MED ORDER — DOXYCYCLINE HYCLATE 100 MG PO TABS: 100.0000 mg | ORAL_TABLET | Freq: Two times a day (BID) | ORAL | 0 refills | Status: DC

## 2022-08-28 MED ORDER — IOPAMIDOL (ISOVUE-300) INJECTION 61%
75.0000 mL | Freq: Once | INTRAVENOUS | Status: AC | PRN
  Administered 2022-08-28: 75 mL via INTRAVENOUS

## 2022-08-28 NOTE — Addendum Note (Signed)
Addended by: Binnie Rail on: 08/28/2022 04:33 PM   Modules accepted: Orders

## 2022-08-31 ENCOUNTER — Ambulatory Visit: Payer: PPO

## 2022-08-31 DIAGNOSIS — M6281 Muscle weakness (generalized): Secondary | ICD-10-CM | POA: Diagnosis not present

## 2022-08-31 DIAGNOSIS — R293 Abnormal posture: Secondary | ICD-10-CM | POA: Diagnosis not present

## 2022-08-31 DIAGNOSIS — R55 Syncope and collapse: Secondary | ICD-10-CM

## 2022-08-31 DIAGNOSIS — M797 Fibromyalgia: Secondary | ICD-10-CM | POA: Diagnosis not present

## 2022-08-31 DIAGNOSIS — M545 Low back pain, unspecified: Secondary | ICD-10-CM | POA: Diagnosis not present

## 2022-08-31 DIAGNOSIS — M546 Pain in thoracic spine: Secondary | ICD-10-CM | POA: Diagnosis not present

## 2022-09-01 ENCOUNTER — Other Ambulatory Visit (INDEPENDENT_AMBULATORY_CARE_PROVIDER_SITE_OTHER): Payer: Medicare HMO

## 2022-09-01 DIAGNOSIS — E039 Hypothyroidism, unspecified: Secondary | ICD-10-CM

## 2022-09-01 LAB — CUP PACEART REMOTE DEVICE CHECK
Date Time Interrogation Session: 20240317231856
Implantable Pulse Generator Implant Date: 20230612

## 2022-09-01 NOTE — Patient Instructions (Signed)
     Medications changes include :   none    I will contact you regarding follow up

## 2022-09-01 NOTE — Progress Notes (Unsigned)
Subjective:    Patient ID: Jonathan [name], male    DOB: [birth year], 89 y.o.   MRN: [id number]      HPI Jonathan [name] is here for No chief complaint on file.   Seen 3/12 for swelling of left lower jaw - painful.  Started on Augmentin.  Ct scan showed infection in submandibular space and masseter muscle.  Not submandibular gland or dental.  2 cm abnormality lateral to left submandibular gland - indeterminate etiology - possible suppurated LN or more focal phlegmon. No drainable abscess.      Medications and allergies reviewed with patient and updated if appropriate.  Current Outpatient Medications on File Prior to Visit  Medication Sig Dispense Refill   acetaminophen (TYLENOL) 650 MG CR tablet Take 1,300 mg by mouth in the morning and at bedtime.     acyclovir ointment (ZOVIRAX) 5 % Apply 1 application topically as needed.     allopurinol (ZYLOPRIM) 300 MG tablet Take 1 tablet (300 mg total) by mouth every evening. For gout 90 tablet 3   amiodarone (PACERONE) 200 MG tablet Take 200 mg by mouth daily.     amLODipine (NORVASC) 5 MG tablet TAKE 1 TABLET (5 MG TOTAL) BY MOUTH DAILY. 90 tablet 2   atorvastatin (LIPITOR) 20 MG tablet TAKE 1 TABLET BY MOUTH EVERY DAY FOR CHOLESTEROL 90 tablet 1   blood glucose meter kit and supplies KIT Dispense based on patient and insurance preference. Use daily as directed. (FOR E11.9). 1 each 0   Calcium Citrate-Vitamin D (CALCIUM + D PO) Take 1 tablet by mouth daily.     carboxymethylcellulose (REFRESH PLUS) 0.5 % SOLN Place 1 drop into both eyes 3 (three) times daily as needed (dry eyes).     cephALEXin (KEFLEX) 500 MG capsule Take 1 capsule (500 mg total) by mouth 3 (three) times daily for 10 days. 30 capsule 0   dapagliflozin propanediol (FARXIGA) 10 MG TABS tablet TAKE 1 TABLET (10 MG TOTAL) BY MOUTH DAILY BEFORE BREAKFAST. FOR DIABETES 90 tablet 1   doxycycline (VIBRA-TABS) 100 MG tablet Take 1 tablet (100 mg total) by mouth 2 (two) times daily for 10  days. 20 tablet 0   Emollient (RA RENEWAL DARK SPOT CORRECTOR EX) Apply 1 application topically daily as needed (dark spots).     FLUoxetine (PROZAC) 40 MG capsule Take 1 capsule (40 mg total) by mouth daily. 90 capsule 3   Fluticasone Propionate (ALLERGY RELIEF NA) Place 2 sprays into the nose daily as needed (allergies).     hydrochlorothiazide (HYDRODIURIL) 12.5 MG tablet TAKE 1 TABLET BY MOUTH EVERY DAY 90 tablet 1   Lancets MISC Use to check blood sugars daily. E11.9 100 each 3   latanoprost (XALATAN) 0.005 % ophthalmic solution Place 1 drop into both eyes at bedtime.     levothyroxine (SYNTHROID) 150 MCG tablet Take 1 tablet (150 mcg total) by mouth daily. 90 tablet 1   Lido-Menthol-Methyl Sal-Camph (CBD KINGS EX) Apply topically as needed.     Melatonin 5 MG TABS Take 5 mg by mouth at bedtime.     Multiple Vitamin (MULTIVITAMIN) tablet Take 1 tablet by mouth daily.     nitroGLYCERIN (NITROSTAT) 0.4 MG SL tablet Place 1 tablet (0.4 mg total) under the tongue every 5 (five) minutes as needed for chest pain. 90 tablet 3   omeprazole (PRILOSEC) 20 MG capsule TAKE 1 CAPSULE BY MOUTH EVERY DAY 90 capsule 1   oxyCODONE-acetaminophen (PERCOCET) 10-325 MG tablet Take 1 tablet by  mouth every 8 (eight) hours as needed for pain.     pregabalin (LYRICA) 75 MG capsule TAKE 1 CAPSULE BY MOUTH TWICE A DAY 180 capsule 0   Probiotic Product (PROBIOTIC PO) Take 1 capsule by mouth daily.     RESTASIS 0.05 % ophthalmic emulsion Place 1 drop into both eyes 2 (two) times daily.      Semaglutide (RYBELSUS) 7 MG TABS Take 7 mg by mouth daily. Via [employer] pt assistance 30 tablet 3   sildenafil (REVATIO) 20 MG tablet Take 40-100 mg by mouth daily as needed.     silodosin (RAPAFLO) 8 MG CAPS capsule Take 8 mg by mouth daily.     telmisartan (MICARDIS) 40 MG tablet Take 1 tablet (40 mg total) by mouth daily. 90 tablet 3   Vibegron (GEMTESA) 75 MG TABS Take 75 mg by mouth daily.     vitamin C (ASCORBIC ACID) 500  MG tablet Take 500 mg by mouth daily.     VITAMIN D, CHOLECALCIFEROL, PO Take 1 capsule by mouth daily.     No current facility-administered medications on file prior to visit.    Review of Systems     Objective:  There were no vitals filed for this visit. BP Readings from Last 3 Encounters:  08/25/22 122/78  08/04/22 132/72  07/17/22 138/68   Wt Readings from Last 3 Encounters:  08/25/22 201 lb (91.2 kg)  08/04/22 198 lb (89.8 kg)  07/17/22 192 lb (87.1 kg)   There is no height or weight on file to calculate BMI.    Physical Exam         Assessment & Plan:    See Problem List for Assessment and Plan of chronic medical problems.

## 2022-09-02 ENCOUNTER — Encounter: Payer: Self-pay | Admitting: Internal Medicine

## 2022-09-02 ENCOUNTER — Ambulatory Visit (INDEPENDENT_AMBULATORY_CARE_PROVIDER_SITE_OTHER): Payer: Medicare HMO | Admitting: Internal Medicine

## 2022-09-02 ENCOUNTER — Inpatient Hospital Stay (HOSPITAL_COMMUNITY)
Admission: EM | Admit: 2022-09-02 | Discharge: 2022-09-09 | DRG: 603 | Disposition: A | Discharge status: Home / Self Care | Payer: Medicare HMO | Source: Ambulatory Visit | Attending: Internal Medicine | Admitting: Internal Medicine

## 2022-09-02 ENCOUNTER — Emergency Department (HOSPITAL_COMMUNITY): Payer: Medicare HMO

## 2022-09-02 VITALS — BP 120/60 | HR 59 | Temp 98.5°F | Ht 66.0 in | Wt 194.0 lb

## 2022-09-02 DIAGNOSIS — Z8616 Personal history of COVID-19: Secondary | ICD-10-CM

## 2022-09-02 DIAGNOSIS — B9561 Methicillin susceptible Staphylococcus aureus infection as the cause of diseases classified elsewhere: Secondary | ICD-10-CM | POA: Diagnosis present

## 2022-09-02 DIAGNOSIS — E039 Hypothyroidism, unspecified: Secondary | ICD-10-CM | POA: Diagnosis not present

## 2022-09-02 DIAGNOSIS — Z6831 Body mass index (BMI) 31.0-31.9, adult: Secondary | ICD-10-CM | POA: Diagnosis not present

## 2022-09-02 DIAGNOSIS — K122 Cellulitis and abscess of mouth: Secondary | ICD-10-CM | POA: Diagnosis not present

## 2022-09-02 DIAGNOSIS — N179 Acute kidney failure, unspecified: Secondary | ICD-10-CM | POA: Diagnosis present

## 2022-09-02 DIAGNOSIS — K219 Gastro-esophageal reflux disease without esophagitis: Secondary | ICD-10-CM | POA: Diagnosis present

## 2022-09-02 DIAGNOSIS — M48061 Spinal stenosis, lumbar region without neurogenic claudication: Secondary | ICD-10-CM | POA: Diagnosis not present

## 2022-09-02 DIAGNOSIS — D696 Thrombocytopenia, unspecified: Secondary | ICD-10-CM | POA: Diagnosis present

## 2022-09-02 DIAGNOSIS — I4819 Other persistent atrial fibrillation: Secondary | ICD-10-CM | POA: Diagnosis present

## 2022-09-02 DIAGNOSIS — R220 Localized swelling, mass and lump, head: Secondary | ICD-10-CM | POA: Diagnosis present

## 2022-09-02 DIAGNOSIS — K760 Fatty (change of) liver, not elsewhere classified: Secondary | ICD-10-CM | POA: Diagnosis present

## 2022-09-02 DIAGNOSIS — M797 Fibromyalgia: Secondary | ICD-10-CM | POA: Diagnosis present

## 2022-09-02 DIAGNOSIS — E785 Hyperlipidemia, unspecified: Secondary | ICD-10-CM | POA: Diagnosis present

## 2022-09-02 DIAGNOSIS — E1142 Type 2 diabetes mellitus with diabetic polyneuropathy: Secondary | ICD-10-CM | POA: Diagnosis present

## 2022-09-02 DIAGNOSIS — Z8673 Personal history of transient ischemic attack (TIA), and cerebral infarction without residual deficits: Secondary | ICD-10-CM

## 2022-09-02 DIAGNOSIS — M109 Gout, unspecified: Secondary | ICD-10-CM | POA: Diagnosis present

## 2022-09-02 DIAGNOSIS — G4733 Obstructive sleep apnea (adult) (pediatric): Secondary | ICD-10-CM | POA: Diagnosis present

## 2022-09-02 DIAGNOSIS — L0201 Cutaneous abscess of face: Principal | ICD-10-CM | POA: Diagnosis present

## 2022-09-02 DIAGNOSIS — Z8042 Family history of malignant neoplasm of prostate: Secondary | ICD-10-CM

## 2022-09-02 DIAGNOSIS — R296 Repeated falls: Secondary | ICD-10-CM | POA: Diagnosis present

## 2022-09-02 DIAGNOSIS — M5136 Other intervertebral disc degeneration, lumbar region: Secondary | ICD-10-CM | POA: Diagnosis not present

## 2022-09-02 DIAGNOSIS — I700 Atherosclerosis of aorta: Secondary | ICD-10-CM | POA: Diagnosis present

## 2022-09-02 DIAGNOSIS — Z7984 Long term (current) use of oral hypoglycemic drugs: Secondary | ICD-10-CM

## 2022-09-02 DIAGNOSIS — F419 Anxiety disorder, unspecified: Secondary | ICD-10-CM | POA: Diagnosis present

## 2022-09-02 DIAGNOSIS — K1120 Sialoadenitis, unspecified: Secondary | ICD-10-CM | POA: Diagnosis not present

## 2022-09-02 DIAGNOSIS — E669 Obesity, unspecified: Secondary | ICD-10-CM | POA: Diagnosis present

## 2022-09-02 DIAGNOSIS — H04123 Dry eye syndrome of bilateral lacrimal glands: Secondary | ICD-10-CM | POA: Diagnosis present

## 2022-09-02 DIAGNOSIS — J3801 Paralysis of vocal cords and larynx, unilateral: Secondary | ICD-10-CM | POA: Insufficient documentation

## 2022-09-02 DIAGNOSIS — J449 Chronic obstructive pulmonary disease, unspecified: Secondary | ICD-10-CM | POA: Diagnosis present

## 2022-09-02 DIAGNOSIS — Z66 Do not resuscitate: Secondary | ICD-10-CM | POA: Diagnosis present

## 2022-09-02 DIAGNOSIS — Z79890 Hormone replacement therapy (postmenopausal): Secondary | ICD-10-CM

## 2022-09-02 DIAGNOSIS — Z8051 Family history of malignant neoplasm of kidney: Secondary | ICD-10-CM

## 2022-09-02 DIAGNOSIS — L03211 Cellulitis of face: Secondary | ICD-10-CM | POA: Diagnosis present

## 2022-09-02 DIAGNOSIS — F32A Depression, unspecified: Secondary | ICD-10-CM | POA: Diagnosis present

## 2022-09-02 DIAGNOSIS — Z801 Family history of malignant neoplasm of trachea, bronchus and lung: Secondary | ICD-10-CM

## 2022-09-02 DIAGNOSIS — E1169 Type 2 diabetes mellitus with other specified complication: Secondary | ICD-10-CM | POA: Diagnosis present

## 2022-09-02 DIAGNOSIS — Z8249 Family history of ischemic heart disease and other diseases of the circulatory system: Secondary | ICD-10-CM

## 2022-09-02 DIAGNOSIS — Z9181 History of falling: Secondary | ICD-10-CM

## 2022-09-02 DIAGNOSIS — M810 Age-related osteoporosis without current pathological fracture: Secondary | ICD-10-CM | POA: Diagnosis present

## 2022-09-02 DIAGNOSIS — M47814 Spondylosis without myelopathy or radiculopathy, thoracic region: Secondary | ICD-10-CM | POA: Diagnosis not present

## 2022-09-02 DIAGNOSIS — I10 Essential (primary) hypertension: Secondary | ICD-10-CM | POA: Diagnosis present

## 2022-09-02 DIAGNOSIS — S065XAS Traumatic subdural hemorrhage with loss of consciousness status unknown, sequela: Secondary | ICD-10-CM

## 2022-09-02 DIAGNOSIS — N400 Enlarged prostate without lower urinary tract symptoms: Secondary | ICD-10-CM | POA: Diagnosis present

## 2022-09-02 DIAGNOSIS — Z79899 Other long term (current) drug therapy: Secondary | ICD-10-CM

## 2022-09-02 DIAGNOSIS — Z8261 Family history of arthritis: Secondary | ICD-10-CM

## 2022-09-02 DIAGNOSIS — L0211 Cutaneous abscess of neck: Secondary | ICD-10-CM | POA: Diagnosis present

## 2022-09-02 DIAGNOSIS — Z87891 Personal history of nicotine dependence: Secondary | ICD-10-CM

## 2022-09-02 DIAGNOSIS — Z9109 Other allergy status, other than to drugs and biological substances: Secondary | ICD-10-CM

## 2022-09-02 LAB — CBC WITH DIFFERENTIAL/PLATELET
Abs Immature Granulocytes: 0 10*3/uL (ref 0.00–0.07)
Basophils Absolute: 0 10*3/uL (ref 0.0–0.1)
Basophils Relative: 0 %
Eosinophils Absolute: 0.1 10*3/uL (ref 0.0–0.5)
Eosinophils Relative: 1 %
HCT: 41.8 % (ref 39.0–52.0)
Hemoglobin: 13.3 g/dL (ref 13.0–17.0)
Lymphocytes Relative: 22 %
Lymphs Abs: 2.4 10*3/uL (ref 0.7–4.0)
MCH: 29 pg (ref 26.0–34.0)
MCHC: 31.8 g/dL (ref 30.0–36.0)
MCV: 91.3 fL (ref 80.0–100.0)
Monocytes Absolute: 2.2 10*3/uL — ABNORMAL HIGH (ref 0.1–1.0)
Monocytes Relative: 20 %
Neutro Abs: 6.2 10*3/uL (ref 1.7–7.7)
Neutrophils Relative %: 57 %
Platelets: 72 10*3/uL — ABNORMAL LOW (ref 150–400)
RBC: 4.58 MIL/uL (ref 4.22–5.81)
RDW: 13.9 % (ref 11.5–15.5)
WBC: 10.8 10*3/uL — ABNORMAL HIGH (ref 4.0–10.5)
nRBC: 0 % (ref 0.0–0.2)

## 2022-09-02 LAB — BASIC METABOLIC PANEL
Anion gap: 10 (ref 5–15)
BUN: 21 mg/dL (ref 8–23)
CO2: 23 mmol/L (ref 22–32)
Calcium: 8.8 mg/dL — ABNORMAL LOW (ref 8.9–10.3)
Chloride: 105 mmol/L (ref 98–111)
Creatinine, Ser: 1.5 mg/dL — ABNORMAL HIGH (ref 0.61–1.24)
GFR, Estimated: 45 mL/min — ABNORMAL LOW (ref >60)
Glucose, Bld: 93 mg/dL (ref 70–99)
Potassium: 4 mmol/L (ref 3.5–5.1)
Sodium: 138 mmol/L (ref 135–145)

## 2022-09-02 LAB — TSH: TSH: 9.8 u[IU]/mL — ABNORMAL HIGH (ref 0.35–5.50)

## 2022-09-02 MED ORDER — SODIUM CHLORIDE 0.9 % IV SOLN
3.0000 g | Freq: Once | INTRAVENOUS | Status: AC
  Administered 2022-09-02: 3 g via INTRAVENOUS
  Filled 2022-09-02: qty 8

## 2022-09-02 MED ORDER — IOHEXOL 350 MG/ML SOLN
75.0000 mL | Freq: Once | INTRAVENOUS | Status: AC | PRN
  Administered 2022-09-02: 75 mL via INTRAVENOUS

## 2022-09-02 MED ORDER — OXYCODONE-ACETAMINOPHEN 5-325 MG PO TABS
2.0000 | ORAL_TABLET | Freq: Once | ORAL | Status: AC
  Administered 2022-09-02: 2 via ORAL
  Filled 2022-09-02: qty 2

## 2022-09-02 MED ORDER — LEVOTHYROXINE SODIUM 150 MCG PO TABS: ORAL_TABLET | ORAL | 1 refills | Status: DC

## 2022-09-02 NOTE — ED Triage Notes (Signed)
Patient sent to ED for evaluation of painful submandibular mass that appeared approximately one and a half weeks ago. Patient states he was seen today by an otolaryngologist who he states inserted a needle into the mass expected to find pus but instead nothing came out.

## 2022-09-02 NOTE — Assessment & Plan Note (Signed)
Chronic Tsh still elevated Increased levothyroxine to 150 mcg daily 6 days, 300 mcg 1 day a week Tsh in 6 weks

## 2022-09-02 NOTE — ED Provider Notes (Signed)
[place] EMERGENCY DEPARTMENT AT [facility] Provider Note  CSN: [id number] Arrival date & time: 09/02/22 7829  Chief Complaint(s) Mass  HPI Jonathan [name] is a 89 y.o. male with PMH BPH, COPD, anxiety, GERD, gout, T2DM, TIA who presents emergency department for evaluation of a left submandibular swelling.  Patient states that he has had a painful growth at the angle of the mandible on the left for the last 1 and half weeks.  He did see an outpatient ENT this afternoon who attempted to aspirate fluid from the area but was unsuccessful.  He was ultimately informed that he needs to come to the emergency department for likely surgical drainage and repeat CT imaging.  He did have a previous CT that showed a developing phlegmon but no drainable abscess.  Endorses significant pain in this area but denies fever, chest pain, shortness of breath, abdominal pain, nausea, vomiting or other systemic symptoms.   Past Medical History Past Medical History:  Diagnosis Date   Abdominal aortic atherosclerosis 02/11/2022   Acute encephalopathy 05/08/2019   Allergic rhinitis, seasonal 12/04/2011   Aortic atherosclerosis 02/04/2021   Arthritis of finger of both hands 03/27/2021   BPH (benign prostatic hyperplasia)    Cataract    Chronic anticoagulation 12/21/2017   CHADS VASC=4, Eliquis stopped Sept 2021- recurrent falls with SDH   Chronic pain, legs and back 03/10/2017   Preferred pain management Leg and back pain   Compression fracture of thoracic vertebra 09/21/2017   COPD (chronic obstructive pulmonary disease)    2 liters O2 HS   Degeneration of thoracic intervertebral disc 09/21/2017   Degenerative joint disease of hand 07/14/2017   Dupuytren's contracture 07/12/2019   Essential hypertension 12/04/2011   Fatty liver 12/29/2020   Fatty tumor    waste and back   Fibromyalgia    Generalized anxiety disorder 06/12/2020   12/21 He is reporting severe insomnia and anxiety symptoms.   Options are limited.  We will try a low-dose lorazepam at night and during the day for anxiety and panic attacks.  He will stop lorazepam if problems.  He will stop drinking alcohol.  Discontinue BuSpar.  Reduce Cymbalta to 1 a day.   GERD (gastroesophageal reflux disease)    Glaucoma 03/08/2016   Gout 03/08/2016   Hereditary and idiopathic peripheral neuropathy 08/28/2015   History of COVID-19 05/08/2019   2021 Post-COVID sx's -" brain fog" Try Lion's mane supplement and B complex with niacin for neuropathy   Hyperlipidemia 03/10/2017   Hypothyroidism    Hypoxia    Insomnia    Left-sided sensorineural hearing loss 07/12/2018   Lumbar spondylosis 02/11/2022   Major depressive disorder 09/07/2016   Mild cognitive impairment of uncertain or unknown etiology 12/27/2020   Obesity (BMI 30.0-34.9) 12/08/2016   Obstructive sleep apnea    Osteoarthritis    Osteoporosis    Pain in joint of right ankle 09/15/2021   Persistent atrial fibrillation    on Eliquis   Recurrent falls 03/25/2020   PT. Treat neuropathy, insomnia.  Reduce Cymbalta to 1 a day.  Discontinue BuSpar.   Rib pain on left side 07/25/2021   Rotator cuff arthropathy of left shoulder 07/21/2019   SDH (subdural hematoma) 03/21/2020   Recurrent SDH secondary to falls at home- Sept 2019 and again 03/14/2020- Eliquis stopped.   Sebaceous cyst 06/27/2019   Spinal compression fracture seventh vertebre   Spondylolisthesis 09/07/2016   On fosamax - for about one year - Dr Ronne Binning  10/14/17 dexa: normal  dexa -- spine 2.1,   RFN -0.9,  LFN   -0.7   - no comparison on file, previous fracture      Thoracic back pain 08/10/2017   Thrombocytopenia 12/20/2020   TIA (transient ischemic attack) 08/28/2015   Type 2 diabetes mellitus 03/08/2016   Patient Active Problem List   Diagnosis Date Noted   Submandibular abscess 08/25/2022   Physical deconditioning 08/04/2022   Acute bronchitis 07/17/2022   Lumbar spondylosis 02/11/2022   Abdominal  aortic atherosclerosis 02/11/2022   Pain in joint of right ankle 09/15/2021   Rib pain on left side 07/25/2021   Arthritis of finger of both hands 03/27/2021   Aortic atherosclerosis 02/04/2021   Fatty liver 12/29/2020   Mild cognitive impairment of uncertain or unknown etiology 12/27/2020   Thrombocytopenia 12/20/2020   Generalized anxiety disorder 06/12/2020   History of recurrent falls 03/25/2020   History of SDH 03/21/2020   Rotator cuff arthropathy of left shoulder 07/21/2019   Dupuytren's contracture 07/12/2019   Sebaceous cyst 06/27/2019   History of COVID-19 05/08/2019   Obstructive sleep apnea    Left-sided sensorineural hearing loss 07/12/2018   Sensorineural hearing loss, asymmetrical 07/12/2018   Chronic anticoagulation 12/21/2017   Degeneration of thoracic intervertebral disc 09/21/2017   Thoracic back pain 08/10/2017   Degenerative joint disease of hand 07/14/2017   Osteoarthritis    Fibromyalgia    Fatty tumor    BPH (benign prostatic hyperplasia)    Insomnia 03/10/2017   Chronic pain, legs and back 03/10/2017   Hyperlipidemia 03/10/2017   Obesity (BMI 30.0-34.9) 12/08/2016   Depression 09/07/2016   Spondylolisthesis 09/07/2016   Hypothyroidism 03/08/2016   GERD (gastroesophageal reflux disease) 03/08/2016   Gout 03/08/2016   Persistent atrial fibrillation 03/08/2016   Glaucoma 03/08/2016   Type 2 diabetes mellitus 03/08/2016   Hereditary and idiopathic peripheral neuropathy 08/28/2015   TIA (transient ischemic attack) 08/28/2015   Essential hypertension 12/04/2011   Allergic rhinitis, seasonal 12/04/2011   COPD (chronic obstructive pulmonary disease)  07/18/2011   Home Medication(s) Prior to Admission medications   Medication Sig Start Date End Date Taking? Authorizing Provider  acetaminophen (TYLENOL) 650 MG CR tablet Take 1,300 mg by mouth in the morning and at bedtime.    [provider]  acyclovir ointment (ZOVIRAX) 5 % Apply 1 application  topically as needed.    [provider]  allopurinol (ZYLOPRIM) 300 MG tablet Take 1 tablet (300 mg total) by mouth every evening. For gout 09/11/21   Pincus Sanes, MD  amiodarone (PACERONE) 200 MG tablet Take 200 mg by mouth daily.    [provider]  amLODipine (NORVASC) 5 MG tablet TAKE 1 TABLET (5 MG TOTAL) BY MOUTH DAILY. 07/03/22   Lewayne Bunting, MD  atorvastatin (LIPITOR) 20 MG tablet TAKE 1 TABLET BY MOUTH EVERY DAY FOR CHOLESTEROL 06/29/22   Pincus Sanes, MD  blood glucose meter kit and supplies KIT Dispense based on patient and insurance preference. Use daily as directed. (FOR E11.9). 03/10/22   Pincus Sanes, MD  Calcium Citrate-Vitamin D (CALCIUM + D PO) Take 1 tablet by mouth daily.    [provider]  carboxymethylcellulose (REFRESH PLUS) 0.5 % SOLN Place 1 drop into both eyes 3 (three) times daily as needed (dry eyes).    [provider]  cephALEXin (KEFLEX) 500 MG capsule Take 1 capsule (500 mg total) by mouth 3 (three) times daily for 10 days. 08/25/22 09/04/22  Pincus Sanes, MD  dapagliflozin  propanediol (FARXIGA) 10 MG TABS tablet TAKE 1 TABLET (10 MG TOTAL) BY MOUTH DAILY BEFORE BREAKFAST. FOR DIABETES 07/30/22   Pincus Sanes, MD  doxycycline (VIBRA-TABS) 100 MG tablet Take 1 tablet (100 mg total) by mouth 2 (two) times daily for 10 days. 08/28/22 09/07/22  Pincus Sanes, MD  Emollient (RA RENEWAL DARK SPOT CORRECTOR EX) Apply 1 application topically daily as needed (dark spots).    [provider]  FLUoxetine (PROZAC) 40 MG capsule Take 1 capsule (40 mg total) by mouth daily. 08/04/22   Pincus Sanes, MD  Fluticasone Propionate (ALLERGY RELIEF NA) Place 2 sprays into the nose daily as needed (allergies).    [provider]  hydrochlorothiazide (HYDRODIURIL) 12.5 MG tablet TAKE 1 TABLET BY MOUTH EVERY DAY 03/02/22   Pincus Sanes, MD  Lancets MISC Use to check blood sugars daily. E11.9 11/08/20   Pincus Sanes, MD   latanoprost (XALATAN) 0.005 % ophthalmic solution Place 1 drop into both eyes at bedtime. 04/18/21   [provider]  levothyroxine (SYNTHROID) 150 MCG tablet Take one tab daily 6 days a week, take 2 tabs once a week 09/02/22   Pincus Sanes, MD  Lido-Menthol-Methyl Sal-Camph (CBD [place] EX) Apply topically as needed.    [provider]  Melatonin 5 MG TABS Take 5 mg by mouth at bedtime.    [provider]  Multiple Vitamin (MULTIVITAMIN) tablet Take 1 tablet by mouth daily.    [provider]  nitroGLYCERIN (NITROSTAT) 0.4 MG SL tablet Place 1 tablet (0.4 mg total) under the tongue every 5 (five) minutes as needed for chest pain. 07/31/21   [place], Peter M, MD  omeprazole (PRILOSEC) 20 MG capsule TAKE 1 CAPSULE BY MOUTH EVERY DAY 06/16/22   Pincus Sanes, MD  oxyCODONE-acetaminophen (PERCOCET) 10-325 MG tablet Take 1 tablet by mouth every 8 (eight) hours as needed for pain.    [provider]  pregabalin (LYRICA) 75 MG capsule TAKE 1 CAPSULE BY MOUTH TWICE A DAY 05/13/22   Pincus Sanes, MD  Probiotic Product (PROBIOTIC PO) Take 1 capsule by mouth daily.    [provider]  RESTASIS 0.05 % ophthalmic emulsion Place 1 drop into both eyes 2 (two) times daily.  09/27/19   [provider]  Semaglutide (RYBELSUS) 7 MG TABS Take 7 mg by mouth daily. Via [employer] pt assistance 03/25/21   Pincus Sanes, MD  sildenafil (REVATIO) 20 MG tablet Take 40-100 mg by mouth daily as needed. 12/01/19   [provider]  silodosin (RAPAFLO) 8 MG CAPS capsule Take 8 mg by mouth daily.    [provider]  telmisartan (MICARDIS) 40 MG tablet Take 1 tablet (40 mg total) by mouth daily. 03/31/22   Lewayne Bunting, MD  Vibegron (GEMTESA) 75 MG TABS Take 75 mg by mouth daily.    [provider]  vitamin C (ASCORBIC ACID) 500 MG tablet Take 500 mg by mouth daily.    [provider]  VITAMIN D, CHOLECALCIFEROL, PO Take 1 capsule  by mouth daily.    [provider]  Past Surgical History Past Surgical History:  Procedure Laterality Date   APPENDECTOMY  age 63   CARPAL TUNNEL RELEASE Right    early 2000s   CATARACT EXTRACTION     bilateral   CHOLECYSTECTOMY  age 19   EYE SURGERY     FOOT ARTHRODESIS Right 02/02/2013   Procedure: RIGHT HALLUX METATARSAL PHALANGEAL JOINT ARTHRODESIS ;  Surgeon: Toni Arthurs, MD;  Location: [facility];  Service: Orthopedics;  Laterality: Right;   INGUINAL HERNIA REPAIR  age 35   rt side   NASAL CONCHA BULLOSA RESECTION  age 89   PROSTATE SURGERY     SHOULDER ARTHROSCOPY W/ ROTATOR CUFF REPAIR Right    early 2000s   tonsil     VASECTOMY  age 38   Family History Family History  Problem Relation Age of Onset   Coronary artery disease Brother    Heart disease Father    Lung cancer Father    Kidney cancer Father    Prostate cancer Father    Arthritis Mother    Lung cancer Mother    Dementia Mother        Unspecified type, not Alzheimer's disease    Social History Social History   Tobacco Use   Smoking status: Former    Packs/day: 2.00    Years: 10.00    Additional pack years: 0.00    Total pack years: 20.00    Types: Cigarettes    Quit date: 07/18/1975    Years since quitting: 47.1   Smokeless tobacco: Never  Vaping Use   Vaping Use: Never used  Substance Use Topics   Alcohol use: Yes    Alcohol/week: 14.0 standard drinks of alcohol    Types: 14 Shots of liquor per week    Comment: 2 shots nightly   Drug use: No   Allergies Other  Review of Systems Review of Systems  HENT:  Positive for facial swelling.     Physical Exam Vital Signs  I have reviewed the triage vital signs BP (!) 150/60   Pulse 71   Temp 97.8 F (36.6 C)   Resp 19   SpO2 94%   Physical Exam Constitutional:      General: He is  not in acute distress.    Appearance: Normal appearance.  HENT:     Head: Normocephalic and atraumatic.     Comments: Tender submandibular mass in the left    Nose: No congestion or rhinorrhea.  Eyes:     General:        Right eye: No discharge.        Left eye: No discharge.     Extraocular Movements: Extraocular movements intact.     Pupils: Pupils are equal, round, and reactive to light.  Cardiovascular:     Rate and Rhythm: Normal rate and regular rhythm.     Heart sounds: No murmur heard. Pulmonary:     Effort: No respiratory distress.     Breath sounds: No wheezing or rales.  Abdominal:     General: There is no distension.     Tenderness: There is no abdominal tenderness.  Musculoskeletal:        General: Normal range of motion.     Cervical back: Normal range of motion.  Skin:    General: Skin is warm and dry.  Neurological:     General: No focal deficit present.     Mental Status: He is alert.     ED Results and  Treatments Labs (all labs ordered are listed, but only abnormal results are displayed) Labs Reviewed  BASIC METABOLIC PANEL - Abnormal; Notable for the following components:      Result Value   Creatinine, Ser 1.50 (*)    Calcium 8.8 (*)    GFR, Estimated 45 (*)    All other components within normal limits  CBC WITH DIFFERENTIAL/PLATELET - Abnormal; Notable for the following components:   WBC 10.8 (*)    Platelets 72 (*)    Monocytes Absolute 2.2 (*)    All other components within normal limits                                                                                                                          Radiology CT Maxillofacial W Contrast  Result Date: 09/02/2022 CLINICAL DATA:  Initial evaluation for painful submandibular mass. EXAM: CT MAXILLOFACIAL WITH CONTRAST TECHNIQUE: Multidetector CT imaging of the maxillofacial structures was performed with intravenous contrast. Multiplanar CT image reconstructions were also generated.  RADIATION DOSE REDUCTION: This exam was performed according to the departmental dose-optimization program which includes automated exposure control, adjustment of the mA and/or kV according to patient size and/or use of iterative reconstruction technique. CONTRAST:  75mL OMNIPAQUE IOHEXOL 350 MG/ML SOLN COMPARISON:  None Available. FINDINGS: Osseous: No acute osseous finding. No discrete or worrisome osseous lesions. Multilevel degenerative spondylosis and facet arthrosis noted within the visualized spine. Orbits: Prior bilateral ocular lens replacement. Otherwise unremarkable. Sinuses: Small right sphenoid sinus retention cyst noted. Paranasal sinuses are otherwise largely clear. Mastoid air cells and middle ear cavities are well pneumatized and free of fluid. Soft tissues: Localized soft tissue swelling with inflammatory stranding seen involving the lower left face near the angle of the left mandible. Inflammatory changes primarily involve the left masticator and submandibular spaces. Superimposed irregular hypodense collection at this location measures 2.6 x 1.9 x 1.7 cm, consistent with abscess (series 3, image 29). Exact source of this infection not identified by CT. No significant dental disease is visualized. Salivary glands including the parotid and submandibular glands are within normal limits. Limited intracranial: Age-related cerebral atrophy. Otherwise unremarkable. IMPRESSION: Localized soft tissue swelling with inflammatory stranding involving the lower left face near the angle of the left mandible, consistent with infection/cellulitis. Superimposed 2.6 x 1.9 x 1.7 cm abscess as above. Electronically Signed   By: Rise Mu M.D.   On: 09/02/2022 22:11    Pertinent labs & imaging results that were available during my care of the patient were reviewed by me and considered in my medical decision making (see MDM for details).  Medications Ordered in ED Medications  Ampicillin-Sulbactam  (UNASYN) 3 g in sodium chloride 0.9 % 100 mL IVPB (has no administration in time range)  oxyCODONE-acetaminophen (PERCOCET/ROXICET) 5-325 MG per tablet 2 tablet (2 tablets Oral Given 09/02/22 2109)  iohexol (OMNIPAQUE) 350 MG/ML injection 75 mL (75 mLs Intravenous Contrast Given 09/02/22 2156)  Procedures Procedures  (including critical care time)  Medical Decision Making / ED Course   This patient presents to the ED for concern of submandibular swelling, this involves an extensive number of treatment options, and is a complaint that carries with it a high risk of complications and morbidity.  The differential diagnosis includes abscess, cellulitis, parotitis, salivary duct stone, malignancy  MDM: Patient seen emergency room for evaluation of a submandibular swelling.  Physical exam with a 2 cm area of erythema and tenderness at the angle of the mandible on the left.  No trismus or respiratory difficulty.  Laboratory evaluation with a leukocytosis to 10.8, creatinine 1.5.  Repeat CT maxillofacial with cellulitis over the left mandible with a superimposed 2.6 x 1.9 x 1.7 cm abscess.  We were eventually able to get in contact with the patient's primary ENT Dr. Ernestene Kiel who recommends medical admission and likely surgical I&D under anesthesia tomorrow.  Unasyn initiated and patient admitted to the hospitalist.   Additional history obtained: -External records from outside source obtained and reviewed including: Chart review including previous notes, labs, imaging, consultation notes   Lab Tests: -I ordered, reviewed, and interpreted labs.   The pertinent results include:   Labs Reviewed  BASIC METABOLIC PANEL - Abnormal; Notable for the following components:      Result Value   Creatinine, Ser 1.50 (*)    Calcium 8.8 (*)    GFR, Estimated 45 (*)    All other  components within normal limits  CBC WITH DIFFERENTIAL/PLATELET - Abnormal; Notable for the following components:   WBC 10.8 (*)    Platelets 72 (*)    Monocytes Absolute 2.2 (*)    All other components within normal limits      Imaging Studies ordered: I ordered imaging studies including CT max face I independently visualized and interpreted imaging. I agree with the radiologist interpretation   Medicines ordered and prescription drug management: Meds ordered this encounter  Medications   oxyCODONE-acetaminophen (PERCOCET/ROXICET) 5-325 MG per tablet 2 tablet   iohexol (OMNIPAQUE) 350 MG/ML injection 75 mL   Ampicillin-Sulbactam (UNASYN) 3 g in sodium chloride 0.9 % 100 mL IVPB    Order Specific Question:   Antibiotic Indication:    Answer:   Other Indication (list below)    Order Specific Question:   Other Indication:    Answer:   facial cellulitis    -I have reviewed the patients home medicines and have made adjustments as needed  Critical interventions none  Consultations Obtained: I requested consultation with the ENT Dr. Ernestene Kiel,  and discussed lab and imaging findings as well as pertinent plan - they recommend: Medical admission and surgical intervention tomorrow   Cardiac Monitoring: The patient was maintained on a cardiac monitor.  I personally viewed and interpreted the cardiac monitored which showed an underlying rhythm of: NSR  Social Determinants of Health:  Factors impacting patients care include: none   Reevaluation: After the interventions noted above, I reevaluated the patient and found that they have :stayed the same  Co morbidities that complicate the patient evaluation  Past Medical History:  Diagnosis Date   Abdominal aortic atherosclerosis 02/11/2022   Acute encephalopathy 05/08/2019   Allergic rhinitis, seasonal 12/04/2011   Aortic atherosclerosis 02/04/2021   Arthritis of finger of both hands 03/27/2021   BPH (benign prostatic hyperplasia)     Cataract    Chronic anticoagulation 12/21/2017   CHADS VASC=4, Eliquis stopped Sept 2021- recurrent falls with SDH   Chronic pain, legs  and back 03/10/2017   Preferred pain management Leg and back pain   Compression fracture of thoracic vertebra 09/21/2017   COPD (chronic obstructive pulmonary disease)    2 liters O2 HS   Degeneration of thoracic intervertebral disc 09/21/2017   Degenerative joint disease of hand 07/14/2017   Dupuytren's contracture 07/12/2019   Essential hypertension 12/04/2011   Fatty liver 12/29/2020   Fatty tumor    waste and back   Fibromyalgia    Generalized anxiety disorder 06/12/2020   12/21 He is reporting severe insomnia and anxiety symptoms.  Options are limited.  We will try a low-dose lorazepam at night and during the day for anxiety and panic attacks.  He will stop lorazepam if problems.  He will stop drinking alcohol.  Discontinue BuSpar.  Reduce Cymbalta to 1 a day.   GERD (gastroesophageal reflux disease)    Glaucoma 03/08/2016   Gout 03/08/2016   Hereditary and idiopathic peripheral neuropathy 08/28/2015   History of COVID-19 05/08/2019   2021 Post-COVID sx's -" brain fog" Try Lion's mane supplement and B complex with niacin for neuropathy   Hyperlipidemia 03/10/2017   Hypothyroidism    Hypoxia    Insomnia    Left-sided sensorineural hearing loss 07/12/2018   Lumbar spondylosis 02/11/2022   Major depressive disorder 09/07/2016   Mild cognitive impairment of uncertain or unknown etiology 12/27/2020   Obesity (BMI 30.0-34.9) 12/08/2016   Obstructive sleep apnea    Osteoarthritis    Osteoporosis    Pain in joint of right ankle 09/15/2021   Persistent atrial fibrillation    on Eliquis   Recurrent falls 03/25/2020   PT. Treat neuropathy, insomnia.  Reduce Cymbalta to 1 a day.  Discontinue BuSpar.   Rib pain on left side 07/25/2021   Rotator cuff arthropathy of left shoulder 07/21/2019   SDH (subdural hematoma) 03/21/2020   Recurrent SDH  secondary to falls at home- Sept 2019 and again 03/14/2020- Eliquis stopped.   Sebaceous cyst 06/27/2019   Spinal compression fracture seventh vertebre   Spondylolisthesis 09/07/2016   On fosamax - for about one year - Dr Ronne Binning  10/14/17 dexa: normal dexa -- spine 2.1,   RFN -0.9,  LFN   -0.7   - no comparison on file, previous fracture      Thoracic back pain 08/10/2017   Thrombocytopenia 12/20/2020   TIA (transient ischemic attack) 08/28/2015   Type 2 diabetes mellitus 03/08/2016      Dispostion: I considered admission for this patient, and due to submandibular abscess requiring surgical intervention, patient require hospital admission     Final Clinical Impression(s) / ED Diagnoses Final diagnoses:  None     @PCDICTATION @    Glendora Score, MD 09/03/22 1202

## 2022-09-02 NOTE — ED Provider Triage Note (Signed)
Emergency Medicine Provider Triage Evaluation Note  Jonathan [name] , a 89 y.o. male  was evaluated in triage.  Pt presents for evaluation of painful submandibular mass on the left side.  It has been there for approximately 1.5 weeks.  He saw Dr. Jenetta Downer with [facility] earlier today who attempted to aspirated fluid from the mass but no fluid came out.  Denies fever, difficulty breathing or swallowing, and he is unsure if it has increased in size.  Dr. Sabino Gasser sent patient to ED for repeat CT scan.   Review of Systems  Positive: As above Negative: As above  Physical Exam  BP (!) 150/60   Pulse 71   Temp 97.8 F (36.6 C)   Resp 19   SpO2 94%  Gen:   Awake, no distress   Resp:  Normal effort  MSK:   Moves extremities without difficulty  Other:  Large left sided, firm submandibular mass without erythema or drainage  Medical Decision Making  Medically screening exam initiated at 7:26 PM.  Appropriate orders placed.  Drean Remme was informed that the remainder of the evaluation will be completed by another provider, this initial triage assessment does not replace that evaluation, and the importance of remaining in the ED until their evaluation is complete.     Theressa Stamps [place], [place] 09/02/22 [phone]

## 2022-09-02 NOTE — Assessment & Plan Note (Signed)
Subacute No improvement with kelfex + doxycycline  Ct scan shows infection in submandibular space and masseter muscle.  Not submandibular gland or dental.  2 cm abnormality lateral to left submandibular gland - indeterminate etiology - possible suppurated LN or more focal phlegmon. No drainable abscess.  Needs ENT asap - does have appt today

## 2022-09-03 ENCOUNTER — Encounter (HOSPITAL_COMMUNITY)
Admission: EM | Disposition: A | Discharge status: Home / Self Care | Payer: Self-pay | Source: Ambulatory Visit | Attending: Internal Medicine

## 2022-09-03 ENCOUNTER — Encounter (HOSPITAL_COMMUNITY): Payer: Self-pay | Admitting: Internal Medicine

## 2022-09-03 ENCOUNTER — Inpatient Hospital Stay (HOSPITAL_COMMUNITY): Payer: Medicare HMO | Admitting: Anesthesiology

## 2022-09-03 DIAGNOSIS — N179 Acute kidney failure, unspecified: Secondary | ICD-10-CM | POA: Diagnosis not present

## 2022-09-03 DIAGNOSIS — R0602 Shortness of breath: Secondary | ICD-10-CM | POA: Diagnosis not present

## 2022-09-03 DIAGNOSIS — S065XAS Traumatic subdural hemorrhage with loss of consciousness status unknown, sequela: Secondary | ICD-10-CM | POA: Diagnosis not present

## 2022-09-03 DIAGNOSIS — I4819 Other persistent atrial fibrillation: Secondary | ICD-10-CM | POA: Diagnosis not present

## 2022-09-03 DIAGNOSIS — I10 Essential (primary) hypertension: Secondary | ICD-10-CM

## 2022-09-03 DIAGNOSIS — Z87891 Personal history of nicotine dependence: Secondary | ICD-10-CM | POA: Diagnosis not present

## 2022-09-03 DIAGNOSIS — J449 Chronic obstructive pulmonary disease, unspecified: Secondary | ICD-10-CM | POA: Diagnosis not present

## 2022-09-03 DIAGNOSIS — L03211 Cellulitis of face: Secondary | ICD-10-CM | POA: Diagnosis not present

## 2022-09-03 DIAGNOSIS — E785 Hyperlipidemia, unspecified: Secondary | ICD-10-CM | POA: Diagnosis not present

## 2022-09-03 DIAGNOSIS — L0201 Cutaneous abscess of face: Secondary | ICD-10-CM

## 2022-09-03 DIAGNOSIS — E669 Obesity, unspecified: Secondary | ICD-10-CM | POA: Diagnosis not present

## 2022-09-03 DIAGNOSIS — I4891 Unspecified atrial fibrillation: Secondary | ICD-10-CM | POA: Diagnosis not present

## 2022-09-03 DIAGNOSIS — E039 Hypothyroidism, unspecified: Secondary | ICD-10-CM | POA: Diagnosis not present

## 2022-09-03 DIAGNOSIS — Z66 Do not resuscitate: Secondary | ICD-10-CM | POA: Diagnosis not present

## 2022-09-03 DIAGNOSIS — I480 Paroxysmal atrial fibrillation: Secondary | ICD-10-CM | POA: Diagnosis not present

## 2022-09-03 DIAGNOSIS — Z79890 Hormone replacement therapy (postmenopausal): Secondary | ICD-10-CM | POA: Diagnosis not present

## 2022-09-03 DIAGNOSIS — E1169 Type 2 diabetes mellitus with other specified complication: Secondary | ICD-10-CM | POA: Diagnosis not present

## 2022-09-03 DIAGNOSIS — E1142 Type 2 diabetes mellitus with diabetic polyneuropathy: Secondary | ICD-10-CM | POA: Diagnosis not present

## 2022-09-03 DIAGNOSIS — F32A Depression, unspecified: Secondary | ICD-10-CM | POA: Diagnosis not present

## 2022-09-03 DIAGNOSIS — Z8616 Personal history of COVID-19: Secondary | ICD-10-CM | POA: Diagnosis not present

## 2022-09-03 DIAGNOSIS — M797 Fibromyalgia: Secondary | ICD-10-CM | POA: Diagnosis not present

## 2022-09-03 DIAGNOSIS — K760 Fatty (change of) liver, not elsewhere classified: Secondary | ICD-10-CM | POA: Diagnosis not present

## 2022-09-03 DIAGNOSIS — L0211 Cutaneous abscess of neck: Secondary | ICD-10-CM | POA: Diagnosis not present

## 2022-09-03 DIAGNOSIS — F419 Anxiety disorder, unspecified: Secondary | ICD-10-CM | POA: Diagnosis not present

## 2022-09-03 DIAGNOSIS — E119 Type 2 diabetes mellitus without complications: Secondary | ICD-10-CM | POA: Diagnosis not present

## 2022-09-03 DIAGNOSIS — D696 Thrombocytopenia, unspecified: Secondary | ICD-10-CM | POA: Diagnosis not present

## 2022-09-03 DIAGNOSIS — B9561 Methicillin susceptible Staphylococcus aureus infection as the cause of diseases classified elsewhere: Secondary | ICD-10-CM | POA: Diagnosis not present

## 2022-09-03 DIAGNOSIS — I700 Atherosclerosis of aorta: Secondary | ICD-10-CM | POA: Diagnosis not present

## 2022-09-03 HISTORY — PX: INCISION AND DRAINAGE ABSCESS: SHX5864

## 2022-09-03 LAB — CBC
HCT: 37.6 % — ABNORMAL LOW (ref 39.0–52.0)
Hemoglobin: 12.1 g/dL — ABNORMAL LOW (ref 13.0–17.0)
MCH: 28.9 pg (ref 26.0–34.0)
MCHC: 32.2 g/dL (ref 30.0–36.0)
MCV: 90 fL (ref 80.0–100.0)
Platelets: 65 10*3/uL — ABNORMAL LOW (ref 150–400)
RBC: 4.18 MIL/uL — ABNORMAL LOW (ref 4.22–5.81)
RDW: 13.7 % (ref 11.5–15.5)
WBC: 9.7 10*3/uL (ref 4.0–10.5)
nRBC: 0 % (ref 0.0–0.2)

## 2022-09-03 LAB — COMPREHENSIVE METABOLIC PANEL
ALT: 17 U/L (ref 0–44)
AST: 19 U/L (ref 15–41)
Albumin: 3.4 g/dL — ABNORMAL LOW (ref 3.5–5.0)
Alkaline Phosphatase: 82 U/L (ref 38–126)
Anion gap: 7 (ref 5–15)
BUN: 20 mg/dL (ref 8–23)
CO2: 24 mmol/L (ref 22–32)
Calcium: 8.4 mg/dL — ABNORMAL LOW (ref 8.9–10.3)
Chloride: 107 mmol/L (ref 98–111)
Creatinine, Ser: 1.31 mg/dL — ABNORMAL HIGH (ref 0.61–1.24)
GFR, Estimated: 53 mL/min — ABNORMAL LOW (ref >60)
Glucose, Bld: 93 mg/dL (ref 70–99)
Potassium: 3.5 mmol/L (ref 3.5–5.1)
Sodium: 138 mmol/L (ref 135–145)
Total Bilirubin: 0.8 mg/dL (ref 0.3–1.2)
Total Protein: 5.6 g/dL — ABNORMAL LOW (ref 6.5–8.1)

## 2022-09-03 LAB — GLUCOSE, CAPILLARY
Glucose-Capillary: 78 mg/dL (ref 70–99)
Glucose-Capillary: 85 mg/dL (ref 70–99)
Glucose-Capillary: 88 mg/dL (ref 70–99)
Glucose-Capillary: 104 mg/dL — ABNORMAL HIGH (ref 70–99)
Glucose-Capillary: 91 mg/dL (ref 70–99)
Glucose-Capillary: 189 mg/dL — ABNORMAL HIGH (ref 70–99)

## 2022-09-03 LAB — PHOSPHORUS: Phosphorus: 3.8 mg/dL (ref 2.5–4.6)

## 2022-09-03 LAB — MAGNESIUM: Magnesium: 2.2 mg/dL (ref 1.7–2.4)

## 2022-09-03 LAB — MRSA NEXT GEN BY PCR, NASAL: MRSA by PCR Next Gen: NOT DETECTED

## 2022-09-03 SURGERY — INCISION AND DRAINAGE, ABSCESS
Anesthesia: General | Site: Face | Laterality: Left

## 2022-09-03 MED ORDER — PANTOPRAZOLE SODIUM 40 MG PO TBEC
40.0000 mg | DELAYED_RELEASE_TABLET | Freq: Every day | ORAL | Status: DC
  Administered 2022-09-03 – 2022-09-09 (×7): 40 mg via ORAL
  Filled 2022-09-03 (×6): qty 1

## 2022-09-03 MED ORDER — SENNOSIDES-DOCUSATE SODIUM 8.6-50 MG PO TABS
1.0000 | ORAL_TABLET | Freq: Every day | ORAL | Status: DC
  Administered 2022-09-03 – 2022-09-04 (×2): 1 via ORAL
  Filled 2022-09-03 (×5): qty 1

## 2022-09-03 MED ORDER — INSULIN ASPART 100 UNIT/ML IJ SOLN: 0.0000 [IU] | INTRAMUSCULAR | Status: DC | PRN

## 2022-09-03 MED ORDER — ATORVASTATIN CALCIUM 10 MG PO TABS
20.0000 mg | ORAL_TABLET | Freq: Every day | ORAL | Status: DC
  Administered 2022-09-03 – 2022-09-09 (×7): 20 mg via ORAL
  Filled 2022-09-03 (×7): qty 2

## 2022-09-03 MED ORDER — HYDROMORPHONE HCL 1 MG/ML IJ SOLN
0.5000 mg | INTRAMUSCULAR | Status: DC | PRN
  Administered 2022-09-03: 1 mg via INTRAVENOUS
  Administered 2022-09-03 (×3): 0.5 mg via INTRAVENOUS
  Administered 2022-09-04: 1 mg via INTRAVENOUS
  Filled 2022-09-03: qty 0.5
  Filled 2022-09-03 (×2): qty 1
  Filled 2022-09-03 (×2): qty 0.5

## 2022-09-03 MED ORDER — MELATONIN 5 MG PO TABS
5.0000 mg | ORAL_TABLET | Freq: Every evening | ORAL | Status: DC | PRN
  Administered 2022-09-08: 5 mg via ORAL
  Filled 2022-09-03: qty 1

## 2022-09-03 MED ORDER — SODIUM CHLORIDE 0.9 % IV SOLN
3.0000 g | Freq: Three times a day (TID) | INTRAVENOUS | Status: DC
  Administered 2022-09-03 – 2022-09-08 (×16): 3 g via INTRAVENOUS
  Filled 2022-09-03 (×16): qty 8

## 2022-09-03 MED ORDER — PREGABALIN 25 MG PO CAPS
75.0000 mg | ORAL_CAPSULE | Freq: Two times a day (BID) | ORAL | Status: DC
  Administered 2022-09-03 – 2022-09-09 (×13): 75 mg via ORAL
  Filled 2022-09-03 (×13): qty 3

## 2022-09-03 MED ORDER — OXYCODONE-ACETAMINOPHEN 5-325 MG PO TABS
1.0000 | ORAL_TABLET | Freq: Once | ORAL | Status: AC
  Administered 2022-09-03: 1 via ORAL
  Filled 2022-09-03: qty 1

## 2022-09-03 MED ORDER — DEXAMETHASONE SODIUM PHOSPHATE 10 MG/ML IJ SOLN
INTRAMUSCULAR | Status: DC | PRN
  Administered 2022-09-03: 4 mg via INTRAVENOUS

## 2022-09-03 MED ORDER — DEXAMETHASONE SODIUM PHOSPHATE 10 MG/ML IJ SOLN
8.0000 mg | Freq: Three times a day (TID) | INTRAMUSCULAR | Status: AC
  Administered 2022-09-03 – 2022-09-04 (×3): 8 mg via INTRAVENOUS
  Filled 2022-09-03 (×3): qty 1

## 2022-09-03 MED ORDER — ACETAMINOPHEN 10 MG/ML IV SOLN
INTRAVENOUS | Status: AC
  Filled 2022-09-03: qty 100

## 2022-09-03 MED ORDER — LIDOCAINE-EPINEPHRINE 1 %-1:100000 IJ SOLN
INTRAMUSCULAR | Status: DC | PRN
  Administered 2022-09-03: 2 mL

## 2022-09-03 MED ORDER — 0.9 % SODIUM CHLORIDE (POUR BTL) OPTIME
TOPICAL | Status: DC | PRN
  Administered 2022-09-03: 1000 mL

## 2022-09-03 MED ORDER — INSULIN ASPART 100 UNIT/ML IJ SOLN
0.0000 [IU] | Freq: Three times a day (TID) | INTRAMUSCULAR | Status: DC
  Administered 2022-09-04: 2 [IU] via SUBCUTANEOUS
  Administered 2022-09-04 (×2): 3 [IU] via SUBCUTANEOUS
  Administered 2022-09-05: 1 [IU] via SUBCUTANEOUS
  Administered 2022-09-05: 2 [IU] via SUBCUTANEOUS
  Administered 2022-09-05 – 2022-09-07 (×2): 1 [IU] via SUBCUTANEOUS
  Administered 2022-09-08: 3 [IU] via SUBCUTANEOUS
  Administered 2022-09-08: 1 [IU] via SUBCUTANEOUS
  Administered 2022-09-08: 2 [IU] via SUBCUTANEOUS

## 2022-09-03 MED ORDER — OXYCODONE HCL 5 MG PO TABS
5.0000 mg | ORAL_TABLET | Freq: Four times a day (QID) | ORAL | Status: DC | PRN
  Administered 2022-09-03 – 2022-09-08 (×5): 5 mg via ORAL
  Filled 2022-09-03 (×6): qty 1

## 2022-09-03 MED ORDER — ACETAMINOPHEN 10 MG/ML IV SOLN
1000.0000 mg | Freq: Once | INTRAVENOUS | Status: DC | PRN
  Administered 2022-09-03: 1000 mg via INTRAVENOUS

## 2022-09-03 MED ORDER — LACTATED RINGERS IV SOLN: INTRAVENOUS | Status: DC

## 2022-09-03 MED ORDER — PROCHLORPERAZINE EDISYLATE 10 MG/2ML IJ SOLN: 5.0000 mg | Freq: Four times a day (QID) | INTRAMUSCULAR | Status: DC | PRN

## 2022-09-03 MED ORDER — PHENYLEPHRINE 80 MCG/ML (10ML) SYRINGE FOR IV PUSH (FOR BLOOD PRESSURE SUPPORT)
PREFILLED_SYRINGE | INTRAVENOUS | Status: AC
  Filled 2022-09-03: qty 10

## 2022-09-03 MED ORDER — LIDOCAINE 2% (20 MG/ML) 5 ML SYRINGE
INTRAMUSCULAR | Status: DC | PRN
  Administered 2022-09-03: 60 mg via INTRAVENOUS

## 2022-09-03 MED ORDER — PROPOFOL 10 MG/ML IV BOLUS
INTRAVENOUS | Status: DC | PRN
  Administered 2022-09-03: 160 mg via INTRAVENOUS

## 2022-09-03 MED ORDER — EPHEDRINE 5 MG/ML INJ
INTRAVENOUS | Status: AC
  Filled 2022-09-03: qty 5

## 2022-09-03 MED ORDER — PROPOFOL 10 MG/ML IV BOLUS
INTRAVENOUS | Status: AC
  Filled 2022-09-03: qty 20

## 2022-09-03 MED ORDER — RISAQUAD PO CAPS
1.0000 | ORAL_CAPSULE | Freq: Every day | ORAL | Status: DC
  Administered 2022-09-03 – 2022-09-09 (×7): 1 via ORAL
  Filled 2022-09-03 (×7): qty 1

## 2022-09-03 MED ORDER — LIDOCAINE 2% (20 MG/ML) 5 ML SYRINGE
INTRAMUSCULAR | Status: AC
  Filled 2022-09-03: qty 5

## 2022-09-03 MED ORDER — FLUOXETINE HCL 20 MG PO CAPS
40.0000 mg | ORAL_CAPSULE | Freq: Every day | ORAL | Status: DC
  Administered 2022-09-03 – 2022-09-09 (×7): 40 mg via ORAL
  Filled 2022-09-03 (×7): qty 2

## 2022-09-03 MED ORDER — IPRATROPIUM-ALBUTEROL 0.5-2.5 (3) MG/3ML IN SOLN: 3.0000 mL | Freq: Four times a day (QID) | RESPIRATORY_TRACT | Status: DC | PRN

## 2022-09-03 MED ORDER — BUPIVACAINE HCL (PF) 0.25 % IJ SOLN
INTRAMUSCULAR | Status: AC
  Filled 2022-09-03: qty 30

## 2022-09-03 MED ORDER — ACETAMINOPHEN 325 MG PO TABS: 650.0000 mg | ORAL_TABLET | Freq: Four times a day (QID) | ORAL | Status: DC | PRN

## 2022-09-03 MED ORDER — LEVOTHYROXINE SODIUM 75 MCG PO TABS
150.0000 ug | ORAL_TABLET | Freq: Every day | ORAL | Status: DC
  Administered 2022-09-03 – 2022-09-09 (×7): 150 ug via ORAL
  Filled 2022-09-03 (×7): qty 2

## 2022-09-03 MED ORDER — FENTANYL CITRATE (PF) 250 MCG/5ML IJ SOLN
INTRAMUSCULAR | Status: DC | PRN
  Administered 2022-09-03: 50 ug via INTRAVENOUS

## 2022-09-03 MED ORDER — POLYETHYLENE GLYCOL 3350 17 G PO PACK: 17.0000 g | PACK | Freq: Every day | ORAL | Status: DC | PRN

## 2022-09-03 MED ORDER — AMLODIPINE BESYLATE 5 MG PO TABS
5.0000 mg | ORAL_TABLET | Freq: Every day | ORAL | Status: DC
  Administered 2022-09-03 – 2022-09-08 (×6): 5 mg via ORAL
  Filled 2022-09-03 (×6): qty 1

## 2022-09-03 MED ORDER — CHLORHEXIDINE GLUCONATE 0.12 % MT SOLN
15.0000 mL | Freq: Once | OROMUCOSAL | Status: AC
  Administered 2022-09-03: 15 mL via OROMUCOSAL
  Filled 2022-09-03: qty 15

## 2022-09-03 MED ORDER — AMIODARONE HCL 200 MG PO TABS
200.0000 mg | ORAL_TABLET | Freq: Every day | ORAL | Status: DC
  Administered 2022-09-03 – 2022-09-09 (×7): 200 mg via ORAL
  Filled 2022-09-03 (×7): qty 1

## 2022-09-03 MED ORDER — HYDROMORPHONE HCL 1 MG/ML IJ SOLN
0.5000 mg | INTRAMUSCULAR | Status: DC | PRN
  Administered 2022-09-03: 0.5 mg via INTRAVENOUS
  Filled 2022-09-03: qty 0.5

## 2022-09-03 MED ORDER — FENTANYL CITRATE (PF) 100 MCG/2ML IJ SOLN
INTRAMUSCULAR | Status: AC
  Filled 2022-09-03: qty 2

## 2022-09-03 MED ORDER — ONDANSETRON HCL 4 MG/2ML IJ SOLN
INTRAMUSCULAR | Status: DC | PRN
  Administered 2022-09-03: 4 mg via INTRAVENOUS

## 2022-09-03 MED ORDER — EPHEDRINE SULFATE-NACL 50-0.9 MG/10ML-% IV SOSY
PREFILLED_SYRINGE | INTRAVENOUS | Status: DC | PRN
  Administered 2022-09-03: 5 mg via INTRAVENOUS

## 2022-09-03 MED ORDER — LIDOCAINE-EPINEPHRINE 1 %-1:100000 IJ SOLN
INTRAMUSCULAR | Status: AC
  Filled 2022-09-03: qty 1

## 2022-09-03 MED ORDER — FENTANYL CITRATE (PF) 100 MCG/2ML IJ SOLN
25.0000 ug | INTRAMUSCULAR | Status: DC | PRN
  Administered 2022-09-03 (×4): 25 ug via INTRAVENOUS

## 2022-09-03 MED ORDER — ORAL CARE MOUTH RINSE: 15.0000 mL | Freq: Once | OROMUCOSAL | Status: AC

## 2022-09-03 MED ORDER — FENTANYL CITRATE (PF) 250 MCG/5ML IJ SOLN
INTRAMUSCULAR | Status: AC
  Filled 2022-09-03: qty 5

## 2022-09-03 SURGICAL SUPPLY — 37 items
BAG COUNTER SPONGE SURGICOUNT (BAG) ×1 IMPLANT
BLADE SURG 15 STRL LF DISP TIS (BLADE) IMPLANT
BLADE SURG 15 STRL SS (BLADE) ×1
CLEANER TIP ELECTROSURG 2X2 (MISCELLANEOUS) ×1 IMPLANT
CNTNR URN SCR LID CUP LEK RST (MISCELLANEOUS) IMPLANT
CONT SPEC 4OZ STRL OR WHT (MISCELLANEOUS) ×1
COVER SURGICAL LIGHT HANDLE (MISCELLANEOUS) ×1 IMPLANT
DERMABOND ADVANCED .7 DNX12 (GAUZE/BANDAGES/DRESSINGS) ×1 IMPLANT
DRAPE HALF SHEET 40X57 (DRAPES) IMPLANT
ELECT COATED BLADE 2.86 ST (ELECTRODE) ×1 IMPLANT
ELECT REM PT RETURN 9FT ADLT (ELECTROSURGICAL) ×1
ELECTRODE REM PT RTRN 9FT ADLT (ELECTROSURGICAL) IMPLANT
GAUZE 4X4 16PLY ~~LOC~~+RFID DBL (SPONGE) ×1 IMPLANT
GAUZE SPONGE 4X4 12PLY STRL (GAUZE/BANDAGES/DRESSINGS) IMPLANT
GLOVE BIO SURGEON STRL SZ7.5 (GLOVE) ×1 IMPLANT
GLOVE BIOGEL PI IND STRL 8 (GLOVE) ×1 IMPLANT
GOWN STRL REUS W/ TWL LRG LVL3 (GOWN DISPOSABLE) ×1 IMPLANT
GOWN STRL REUS W/ TWL XL LVL3 (GOWN DISPOSABLE) ×1 IMPLANT
GOWN STRL REUS W/TWL LRG LVL3 (GOWN DISPOSABLE) ×1
GOWN STRL REUS W/TWL XL LVL3 (GOWN DISPOSABLE) ×1
KIT BASIN OR (CUSTOM PROCEDURE TRAY) ×1 IMPLANT
KIT TURNOVER KIT B (KITS) ×1 IMPLANT
NDL 27GX1/2 REG BEVEL ECLIP (NEEDLE) ×1 IMPLANT
NEEDLE 27GX1/2 REG BEVEL ECLIP (NEEDLE) ×1 IMPLANT
NS IRRIG 1000ML POUR BTL (IV SOLUTION) ×1 IMPLANT
PAD ARMBOARD 7.5X6 YLW CONV (MISCELLANEOUS) ×2 IMPLANT
PENCIL SMOKE EVACUATOR (MISCELLANEOUS) ×1 IMPLANT
STOCKINETTE TUBULAR COTT 4X25 (GAUZE/BANDAGES/DRESSINGS) IMPLANT
SUT MNCRL AB 4-0 PS2 18 (SUTURE) IMPLANT
SUT VIC AB 3-0 SH 27 (SUTURE) ×1
SUT VIC AB 3-0 SH 27XBRD (SUTURE) IMPLANT
SUT VICRYL 4-0 PS2 18IN ABS (SUTURE) IMPLANT
SWAB COLLECTION DEVICE MRSA (MISCELLANEOUS) IMPLANT
SWAB CULTURE ESWAB REG 1ML (MISCELLANEOUS) IMPLANT
SYR CONTROL 10ML LL (SYRINGE) ×1 IMPLANT
TOWEL GREEN STERILE (TOWEL DISPOSABLE) ×1 IMPLANT
TRAY ENT MC OR (CUSTOM PROCEDURE TRAY) ×1 IMPLANT

## 2022-09-03 NOTE — ED Notes (Signed)
ED TO INPATIENT HANDOFF REPORT  ED Nurse Name and Phone #: [place] Rn [phone]  Pt is alert and oriented, on room air, ambulates unassisted and is currently reporting 6/10 pain that I am medicating now with percocet.   S Name/Age/Gender Jonathan [name] 89 y.o. male Room/Bed: H020C/H020C  Code Status   Code Status: Prior  Home/SNF/Other Home Patient oriented to: self, place, time, and situation Is this baseline? Yes   Triage Complete: Triage complete  Chief Complaint Facial abscess [L02.01]  Triage Note Patient sent to ED for evaluation of painful submandibular mass that appeared approximately one and a half weeks ago. Patient states he was seen today by an otolaryngologist who he states inserted a needle into the mass expected to find pus but instead nothing came out.    Allergies Allergies  Allergen Reactions   Other Other (See Comments)    DUST-Other reaction(s): Respiratory distress    Level of Care/Admitting Diagnosis ED Disposition     ED Disposition  Admit   Condition  --   Comment  Hospital Area: [facility] [[id number]]  Level of Care: Telemetry Surgical [105]  May admit patient to [facility] or [facility] if equivalent level of care is available:: No  Covid Evaluation: Asymptomatic - no recent exposure (last 10 days) testing not required  Diagnosis: Facial abscess [id number]  Admitting Physician: Kayleen Memos [id number]  Attending Physician: Kayleen Memos [id number]  Certification:: I certify this patient will need inpatient services for at least 2 midnights  Estimated Length of Stay: 2          B Medical/Surgery History Past Medical History:  Diagnosis Date   Abdominal aortic atherosclerosis 02/11/2022   Acute encephalopathy 05/08/2019   Allergic rhinitis, seasonal 12/04/2011   Aortic atherosclerosis 02/04/2021   Arthritis of finger of both hands 03/27/2021   BPH (benign prostatic hyperplasia)    Cataract    Chronic  anticoagulation 12/21/2017   CHADS VASC=4, Eliquis stopped Sept 2021- recurrent falls with SDH   Chronic pain, legs and back 03/10/2017   Preferred pain management Leg and back pain   Compression fracture of thoracic vertebra 09/21/2017   COPD (chronic obstructive pulmonary disease)    2 liters O2 HS   Degeneration of thoracic intervertebral disc 09/21/2017   Degenerative joint disease of hand 07/14/2017   Dupuytren's contracture 07/12/2019   Essential hypertension 12/04/2011   Fatty liver 12/29/2020   Fatty tumor    waste and back   Fibromyalgia    Generalized anxiety disorder 06/12/2020   12/21 He is reporting severe insomnia and anxiety symptoms.  Options are limited.  We will try a low-dose lorazepam at night and during the day for anxiety and panic attacks.  He will stop lorazepam if problems.  He will stop drinking alcohol.  Discontinue BuSpar.  Reduce Cymbalta to 1 a day.   GERD (gastroesophageal reflux disease)    Glaucoma 03/08/2016   Gout 03/08/2016   Hereditary and idiopathic peripheral neuropathy 08/28/2015   History of COVID-19 05/08/2019   2021 Post-COVID sx's -" brain fog" Try Lion's mane supplement and B complex with niacin for neuropathy   Hyperlipidemia 03/10/2017   Hypothyroidism    Hypoxia    Insomnia    Left-sided sensorineural hearing loss 07/12/2018   Lumbar spondylosis 02/11/2022   Major depressive disorder 09/07/2016   Mild cognitive impairment of uncertain or unknown etiology 12/27/2020   Obesity (BMI 30.0-34.9) 12/08/2016   Obstructive sleep apnea    Osteoarthritis  Osteoporosis    Pain in joint of right ankle 09/15/2021   Persistent atrial fibrillation    on Eliquis   Recurrent falls 03/25/2020   PT. Treat neuropathy, insomnia.  Reduce Cymbalta to 1 a day.  Discontinue BuSpar.   Rib pain on left side 07/25/2021   Rotator cuff arthropathy of left shoulder 07/21/2019   SDH (subdural hematoma) 03/21/2020   Recurrent SDH secondary to falls at home-  Sept 2019 and again 03/14/2020- Eliquis stopped.   Sebaceous cyst 06/27/2019   Spinal compression fracture seventh vertebre   Spondylolisthesis 09/07/2016   On fosamax - for about one year - Dr Alyson Ingles  10/14/17 dexa: normal dexa -- spine 2.1,   RFN -0.9,  LFN   -0.7   - no comparison on file, previous fracture      Thoracic back pain 08/10/2017   Thrombocytopenia 12/20/2020   TIA (transient ischemic attack) 08/28/2015   Type 2 diabetes mellitus 03/08/2016   Past Surgical History:  Procedure Laterality Date   APPENDECTOMY  age 22   [place] Right    early 2000s   CATARACT EXTRACTION     bilateral   CHOLECYSTECTOMY  age 81   EYE SURGERY     FOOT ARTHRODESIS Right 02/02/2013   Procedure: RIGHT HALLUX METATARSAL PHALANGEAL JOINT ARTHRODESIS ;  Surgeon: Wylene Simmer, MD;  Location: [facility];  Service: Orthopedics;  Laterality: Right;   INGUINAL HERNIA REPAIR  age 16   rt side   NASAL CONCHA BULLOSA RESECTION  age 69   PROSTATE SURGERY     SHOULDER ARTHROSCOPY W/ ROTATOR CUFF REPAIR Right    early 2000s   tonsil     VASECTOMY  age 38     A IV Location/Drains/Wounds Patient Lines/Drains/Airways Status     Active Line/Drains/Airways     Name Placement date Placement time Site Days   Peripheral IV 09/02/22 20 G Right Antecubital 09/02/22  1944  Antecubital  1            Intake/Output Last 24 hours No intake or output data in the 24 hours ending 09/03/22 0023  Labs/Imaging Results for orders placed or performed during the hospital encounter of 09/02/22 (from the past 48 hour(s))  Basic metabolic panel     Status: Abnormal   Collection Time: 09/02/22  7:26 PM  Result Value Ref Range   Sodium 138 135 - 145 mmol/L   Potassium 4.0 3.5 - 5.1 mmol/L   Chloride 105 98 - 111 mmol/L   CO2 23 22 - 32 mmol/L   Glucose, Bld 93 70 - 99 mg/dL    Comment: Glucose reference range applies only to samples taken after fasting for at least 8 hours.   BUN 21 8  - 23 mg/dL   Creatinine, Ser 1.50 (H) 0.61 - 1.24 mg/dL   Calcium 8.8 (L) 8.9 - 10.3 mg/dL   GFR, Estimated 45 (L) >60 mL/min    Comment: (NOTE) Calculated using the CKD-EPI Creatinine Equation (2021)    Anion gap 10 5 - 15    Comment: Performed at [facility] [address] [place], [place] [address]  CBC with Differential     Status: Abnormal   Collection Time: 09/02/22  7:26 PM  Result Value Ref Range   WBC 10.8 (H) 4.0 - 10.5 K/uL   RBC 4.58 4.22 - 5.81 MIL/uL   Hemoglobin 13.3 13.0 - 17.0 g/dL   HCT 41.8 39.0 - 52.0 %   MCV  91.3 80.0 - 100.0 fL   MCH 29.0 26.0 - 34.0 pg   MCHC 31.8 30.0 - 36.0 g/dL   RDW 13.9 11.5 - 15.5 %   Platelets 72 (L) 150 - 400 K/uL    Comment: Immature Platelet Fraction may be clinically indicated, consider ordering this additional test [id number] REPEATED TO VERIFY    nRBC 0.0 0.0 - 0.2 %   Neutrophils Relative % 57 %   Neutro Abs 6.2 1.7 - 7.7 K/uL   Lymphocytes Relative 22 %   Lymphs Abs 2.4 0.7 - 4.0 K/uL   Monocytes Relative 20 %   Monocytes Absolute 2.2 (H) 0.1 - 1.0 K/uL   Eosinophils Relative 1 %   Eosinophils Absolute 0.1 0.0 - 0.5 K/uL   Basophils Relative 0 %   Basophils Absolute 0.0 0.0 - 0.1 K/uL   WBC Morphology MORPHOLOGY UNREMARKABLE    RBC Morphology MORPHOLOGY UNREMARKABLE    Smear Review MORPHOLOGY UNREMARKABLE    Abs Immature Granulocytes 0.00 0.00 - 0.07 K/uL    Comment: Performed at [facility], [address], [place], [place] [address]   CT Maxillofacial W Contrast  Result Date: 09/02/2022 CLINICAL DATA:  Initial evaluation for painful submandibular mass. EXAM: CT MAXILLOFACIAL WITH CONTRAST TECHNIQUE: Multidetector CT imaging of the maxillofacial structures was performed with intravenous contrast. Multiplanar CT image reconstructions were also generated. RADIATION DOSE REDUCTION: This exam was performed according to the departmental dose-optimization program which includes automated exposure control,  adjustment of the mA and/or kV according to patient size and/or use of iterative reconstruction technique. CONTRAST:  43mL OMNIPAQUE IOHEXOL 350 MG/ML SOLN COMPARISON:  None Available. FINDINGS: Osseous: No acute osseous finding. No discrete or worrisome osseous lesions. Multilevel degenerative spondylosis and facet arthrosis noted within the visualized spine. Orbits: Prior bilateral ocular lens replacement. Otherwise unremarkable. Sinuses: Small right sphenoid sinus retention cyst noted. Paranasal sinuses are otherwise largely clear. Mastoid air cells and middle ear cavities are well pneumatized and free of fluid. Soft tissues: Localized soft tissue swelling with inflammatory stranding seen involving the lower left face near the angle of the left mandible. Inflammatory changes primarily involve the left masticator and submandibular spaces. Superimposed irregular hypodense collection at this location measures 2.6 x 1.9 x 1.7 cm, consistent with abscess (series 3, image 29). Exact source of this infection not identified by CT. No significant dental disease is visualized. Salivary glands including the parotid and submandibular glands are within normal limits. Limited intracranial: Age-related cerebral atrophy. Otherwise unremarkable. IMPRESSION: Localized soft tissue swelling with inflammatory stranding involving the lower left face near the angle of the left mandible, consistent with infection/cellulitis. Superimposed 2.6 x 1.9 x 1.7 cm abscess as above. Electronically Signed   By: Jeannine Boga M.D.   On: 09/02/2022 22:11    Pending Labs Unresulted Labs (From admission, onward)    None       Vitals/Pain Today's Vitals   09/02/22 1901 09/02/22 1902 09/02/22 2317 09/03/22 0000  BP: (!) 150/60   (!) 148/65  Pulse: 71   74  Resp: 19   18  Temp: 97.8 F (36.6 C)     SpO2: 94%   95%  PainSc:  10-Worst pain ever 4      Isolation Precautions No active isolations  Medications Medications   oxyCODONE-acetaminophen (PERCOCET/ROXICET) 5-325 MG per tablet 2 tablet (2 tablets Oral Given 09/02/22 2109)  iohexol (OMNIPAQUE) 350 MG/ML injection 75 mL (75 mLs Intravenous Contrast Given 09/02/22 2156)  Ampicillin-Sulbactam (UNASYN) 3 g  in sodium chloride 0.9 % 100 mL IVPB (0 g Intravenous Stopped 09/03/22 0004)  oxyCODONE-acetaminophen (PERCOCET/ROXICET) 5-325 MG per tablet 1 tablet (1 tablet Oral Given 09/03/22 0017)    Mobility walks     Focused Assessments    R Recommendations: See Admitting Provider Note  Report given to:   Additional Notes:  See top of SBAR

## 2022-09-03 NOTE — Progress Notes (Signed)
PROGRESS NOTE        PATIENT DETAILS Name: Jonathan [name] Age: 89 y.o. Sex: male Date of Birth: June 02, 1933 Admit Date: 09/02/2022 Admitting Physician Kayleen Memos, DO [id number], Claudina Lick, MD  Brief Summary: Patient is a 89 y.o.  male with history of PAF-no longer on Eliquis since May 2023 due to frequent falls/SDH/thrombocytopenia-who presented to the ED from ENTs office for a left facial abscess.    Significant events: 3/20>> referred to ED from ENTs office for left facial abscess-admit to [facility]  Significant studies: 3/20>> CT maxillofacial: Localized soft tissue swelling near the angle of the mandible consistent with infection/cellulitis-superimposed 2.6 x 1.9 x 1.7 cm abscess.  Significant microbiology data: None  Procedures: None  Consults: ENT  Subjective: Lying comfortably in bed-denies any chest pain or shortness of breath.  Left facial abscess unchanged-continues to have some pain.  Objective: Vitals: Blood pressure (!) 143/57, pulse (!) 55, temperature 98 F (36.7 C), temperature source Oral, resp. rate 12, height 5\' 6"  (1.676 m), weight 85.4 kg, SpO2 98 %.   Exam: Gen Exam:Alert awake-not in any distress HEENT: Significant swelling in the left mandibular area-hard to touch-with some fluctuation. Chest: B/L clear to auscultation anteriorly CVS:S1S2 regular Abdomen:soft non tender, non distended Extremities:no edema Neurology: Non focal Skin: no rash  Pertinent Labs/Radiology:    Latest Ref Rng & Units 09/03/2022    4:47 AM 09/02/2022    7:26 PM 06/18/2022   10:41 AM  CBC  WBC 4.0 - 10.5 K/uL 9.7  10.8  7.6   Hemoglobin 13.0 - 17.0 g/dL 12.1  13.3  12.6   Hematocrit 39.0 - 52.0 % 37.6  41.8  39.2   Platelets 150 - 400 K/uL 65  72  62     Lab Results  Component Value Date   NA 138 09/03/2022   K 3.5 09/03/2022   CL 107 09/03/2022   CO2 24 09/03/2022      Assessment/Plan: Left facial abscess Unchanged overnight IV  Unasyn For I&D by ENT today.  AKI Likely hemodynamically mediated Creatinine trending down Avoid nephrotoxic agents Follow electrolytes  PAF Sinus rhythm Continue Amio alone No longer on anticoagulation given fall risk/SDH/thrombocytopenia  HLD Statin  HTN BP slightly on the higher side Resume amlodipine Hold other antihypertensives for now  COPD Not in exacerbation Bronchodilators as needed  Hypothyroidism Continue levothyroxine TSH downtrending at 9.8 Defer further changes of levothyroxine dosage to the outpatient setting  Anxiety/depression Appears stable Continue Prozac  Thrombocytopenia At baseline-chronic issue  Peripheral neuropathy Likely due to diabetes Continue Lyrica  Frequent falls History of SDH PT/OT eval  OSA CPAP when able-hopefully after I&D  Obesity: Estimated body mass index is 30.39 kg/m as calculated from the following:   Height as of this encounter: 5\' 6"  (1.676 m).   Weight as of this encounter: 85.4 kg.   Code status:   Code Status: Full Code   DVT Prophylaxis: SCDs Start: 09/03/22 0040   Family Communication: None at bedside   Disposition Plan: Status is: Inpatient Remains inpatient appropriate because: severity of illness   Planned Discharge Destination:Home   Diet: Diet Order             Diet NPO time specified Except for: Sips with Meds  Diet effective now  Antimicrobial agents: Anti-infectives (From admission, onward)    Start     Dose/Rate Route Frequency Ordered Stop   09/03/22 0600  Ampicillin-Sulbactam (UNASYN) 3 g in sodium chloride 0.9 % 100 mL IVPB        3 g 200 mL/hr over 30 Minutes Intravenous Every 8 hours 09/03/22 0128     09/02/22 2230  Ampicillin-Sulbactam (UNASYN) 3 g in sodium chloride 0.9 % 100 mL IVPB        3 g 200 mL/hr over 30 Minutes Intravenous  Once 09/02/22 2215 09/03/22 0004        MEDICATIONS: Scheduled Meds:  acidophilus  1 capsule Oral  Daily   atorvastatin  20 mg Oral Daily   FLUoxetine  40 mg Oral Daily   levothyroxine  150 mcg Oral Q0600   pantoprazole  40 mg Oral Daily   pregabalin  75 mg Oral BID   senna-docusate  1 tablet Oral QHS   Continuous Infusions:  ampicillin-sulbactam (UNASYN) IV 3 g (09/03/22 0603)   lactated ringers 50 mL/hr at 09/03/22 0215   PRN Meds:.acetaminophen, HYDROmorphone (DILAUDID) injection, melatonin, oxyCODONE, polyethylene glycol, prochlorperazine   I have personally reviewed following labs and imaging studies  LABORATORY DATA: CBC: Recent Labs  Lab 09/02/22 1926 09/03/22 0447  WBC 10.8* 9.7  NEUTROABS 6.2  --   HGB 13.3 12.1*  HCT 41.8 37.6*  MCV 91.3 90.0  PLT 72* 65*    Basic Metabolic Panel: Recent Labs  Lab 09/02/22 1926 09/03/22 0447  NA 138 138  K 4.0 3.5  CL 105 107  CO2 23 24  GLUCOSE 93 93  BUN 21 20  CREATININE 1.50* 1.31*  CALCIUM 8.8* 8.4*  MG  --  2.2  PHOS  --  3.8    GFR: Estimated Creatinine Clearance: 40.7 mL/min (A) (by C-G formula based on SCr of 1.31 mg/dL (H)).  Liver Function Tests: Recent Labs  Lab 09/03/22 0447  AST 19  ALT 17  ALKPHOS 82  BILITOT 0.8  PROT 5.6*  ALBUMIN 3.4*   No results for input(s): "LIPASE", "AMYLASE" in the last 168 hours. No results for input(s): "AMMONIA" in the last 168 hours.  Coagulation Profile: No results for input(s): "INR", "PROTIME" in the last 168 hours.  Cardiac Enzymes: No results for input(s): "CKTOTAL", "CKMB", "CKMBINDEX", "TROPONINI" in the last 168 hours.  BNP (last 3 results) No results for input(s): "PROBNP" in the last 8760 hours.  Lipid Profile: No results for input(s): "CHOL", "HDL", "LDLCALC", "TRIG", "CHOLHDL", "LDLDIRECT" in the last 72 hours.  Thyroid Function Tests: Recent Labs    09/01/22 1607  TSH 9.80*    Anemia Panel: No results for input(s): "VITAMINB12", "FOLATE", "FERRITIN", "TIBC", "IRON", "RETICCTPCT" in the last 72 hours.  Urine analysis:     Component Value Date/Time   COLORURINE YELLOW 12/20/2020 1150   APPEARANCEUR CLEAR 12/20/2020 1150   LABSPEC 1.025 12/20/2020 1150   PHURINE 5.5 12/20/2020 1150   GLUCOSEU >=1000 (A) 12/20/2020 1150   HGBUR NEGATIVE 12/20/2020 1150   BILIRUBINUR NEGATIVE 12/20/2020 1150   BILIRUBINUR neg 11/23/2012 0942   KETONESUR TRACE (A) 12/20/2020 1150   PROTEINUR 100 (A) 03/14/2020 1258   UROBILINOGEN 0.2 12/20/2020 1150   NITRITE NEGATIVE 12/20/2020 1150   LEUKOCYTESUR NEGATIVE 12/20/2020 1150    Sepsis Labs: Lactic Acid, Venous    Component Value Date/Time   LATICACIDVEN 1.2 05/07/2019 1422    MICROBIOLOGY: Recent Results (from the past 240 hour(s))  MRSA Next Gen by PCR, Nasal  Status: None   Collection Time: 09/03/22  1:15 AM   Specimen: Nasal Mucosa; Nasal Swab  Result Value Ref Range Status   MRSA by PCR Next Gen NOT DETECTED NOT DETECTED Final    Comment: (NOTE) The GeneXpert MRSA Assay (FDA approved for NASAL specimens only), is one component of a comprehensive MRSA colonization surveillance program. It is not intended to diagnose MRSA infection nor to guide or monitor treatment for MRSA infections. Test performance is not FDA approved in patients less than 78 years old. Performed at [facility], [place] [address] [place], [place] [address]     RADIOLOGY STUDIES/RESULTS: CT Maxillofacial W Contrast  Result Date: 09/02/2022 CLINICAL DATA:  Initial evaluation for painful submandibular mass. EXAM: CT MAXILLOFACIAL WITH CONTRAST TECHNIQUE: Multidetector CT imaging of the maxillofacial structures was performed with intravenous contrast. Multiplanar CT image reconstructions were also generated. RADIATION DOSE REDUCTION: This exam was performed according to the departmental dose-optimization program which includes automated exposure control, adjustment of the mA and/or kV according to patient size and/or use of iterative reconstruction technique. CONTRAST:  33mL OMNIPAQUE  IOHEXOL 350 MG/ML SOLN COMPARISON:  None Available. FINDINGS: Osseous: No acute osseous finding. No discrete or worrisome osseous lesions. Multilevel degenerative spondylosis and facet arthrosis noted within the visualized spine. Orbits: Prior bilateral ocular lens replacement. Otherwise unremarkable. Sinuses: Small right sphenoid sinus retention cyst noted. Paranasal sinuses are otherwise largely clear. Mastoid air cells and middle ear cavities are well pneumatized and free of fluid. Soft tissues: Localized soft tissue swelling with inflammatory stranding seen involving the lower left face near the angle of the left mandible. Inflammatory changes primarily involve the left masticator and submandibular spaces. Superimposed irregular hypodense collection at this location measures 2.6 x 1.9 x 1.7 cm, consistent with abscess (series 3, image 29). Exact source of this infection not identified by CT. No significant dental disease is visualized. Salivary glands including the parotid and submandibular glands are within normal limits. Limited intracranial: Age-related cerebral atrophy. Otherwise unremarkable. IMPRESSION: Localized soft tissue swelling with inflammatory stranding involving the lower left face near the angle of the left mandible, consistent with infection/cellulitis. Superimposed 2.6 x 1.9 x 1.7 cm abscess as above. Electronically Signed   By: Jeannine Boga M.D.   On: 09/02/2022 22:11     LOS: 0 days   Oren Binet, MD  [facility]    To contact the attending provider between 7A-7P or the covering provider during after hours 7P-7A, please log into the web site [email or web address] and access using universal [place] password for that web site. If you do not have the password, please call the hospital operator.  09/03/2022, 8:57 AM

## 2022-09-03 NOTE — Op Note (Signed)
OPERATIVE NOTE  Artan Voytko Date/Time of Admission: 09/02/2022  6:57 PM  CSN: [id number] Attending Provider: Jonetta Osgood, MD Room/Bed: MCPO/NONE DOB: April 08, 1933 Age: 89 y.o.   Pre-Op Diagnosis: Left Facial Abscess  Post-Op Diagnosis: Left Facial Abscess  Procedure: Procedure(s): INCISION AND DRAINAGE LEFT FACIAL  ABSCESS  Anesthesia: General  Surgeon(s): Pamala Hurry, MD  Staff: Circulator: Beau Fanny, RN Scrub Person: Rico Ala  Implants: * No implants in log *  Specimens: ID Type Source Tests Collected by Time Destination  1 : Left facial tissue Tissue PATH Other SURGICAL PATHOLOGY Jenetta Downer, MD 09/03/2022 1540   A : Left facial abscess Abscess Abscess AEROBIC/ANAEROBIC CULTURE W GRAM STAIN (SURGICAL/DEEP WOUND) Jenetta Downer, MD [id number]     Complications: none  EBL: 10 ML  IVF: Per anesthesia ML  Condition: stable  Operative Findings:  Left multi-loculated facial abscess incised , drained and washed out. Approximately 10 cc of frank pus evacuated. Cultures and tissue specimen sent for path.   Indications for Procedure: 89 y/o M with multiloculated facial abscess, unclear etiology, who presents now for definitive surgical management.   Description of Operation:  The patient was identified in the preoperative area and consent confirmed in the chart.  He was brought to the operating room by the anesthetist and a preoperative huddle was performed confirming the patient's identity and procedure to be performed.  Once all were in agreement we proceeded with surgery.  General anesthesia was induced and the patient was intubated with a laryngeal mask airway.  This was secured. The patient's head was turned to the right exposing the left neck. A transverse incision 3 cm, and 2 fingerbreadths below the level of the mandible was marked and anesthetized with lidocaine/epi.   The patient was prepped and draped in standard  fashion for procedure of this kind.  Final preoperative pause was performed and we proceeded with surgery.   A 15 blade was used to incise the skin. The platysma was divided with bovie. An Careers adviser was used to retract the edematous superior skin flap. Blunt dissection with tonsil was used to dissect above and posterior the submandibular space into the area of the multiloculated abscess. Frank pus was evacuated and all loculations were broken with blunt dissection. The wound was copiously irrigated with sterile saline. A 1/4" penrose drain was placed and the wound was closed with 3-0 vicryl platysmal layer suture and 3-0 nylon.   A dressings consisting of gauze fluffs and bandnet were applied.  The patient was turned back to anesthesia, extubated and brought to the recovery room in stable condition.  Plan: - Return to medicine ward for IV abx - F/u Cultures - Penrose drain removed POD#2-3 pending clinical course   Pamala Hurry, MD [facility]  09/03/2022

## 2022-09-03 NOTE — H&P (Signed)
History and Physical  [name] [id number] DOB: 1932-09-29 DOA: 09/02/2022  Referring physician: Dr. Matilde Sprang, [place]  PCP: Binnie Rail, MD  Outpatient Specialists: ENT, cardiology, hematology/oncology, neurology.  Patient coming from: Home through ENT's office  Chief Complaint: left facial swelling and pain   HPI: Jonathan [name] is a 89 y.o. male with medical history significant for paroxysmal atrial fibrillation, previously on Eliquis, this was discontinued in May 2023 due to recurrent falls, syncope resulting in subdural hematoma, chronic thrombocytopenia, followed by hematology-oncology, hypertension, type 2 diabetes, diabetic polyneuropathy, hypothyroidism, general anxiety/depression, fibromyalgia, gout, OSA on CPAP, who presented to [facility] ED sent by ENTs office for CT scan of his left submandibular gland due to persistent left facial swelling and pain for a week and half despite oral antibiotics Keflex and doxycycline x 7 days.  Denies any subjective fevers or chills.  Denies dysphagia or odynophagia or dental pain.  In the ED, CT maxillofacial with contrast revealed findings consistent with infection/cellulitis with superimposed 2.6 x 1.9 x 1.7 cm abscess involving the lower left face near the angle of the left mandible.  Salivary glands including the parotid and submandibular glands are within normal limits.  The patient received a dose of Unasyn in the ED as well as opioid-based analgesics.  EDP discussed the case with the patient's ENT provider, Dr. Sabino Gasser who will see in consultation.  Recommended n.p.o. after midnight for possible I&D on 09/03/2022.  The patient was admitted by [facility], hospitalist service.  ED Course: Tmax 98.5.  BP 148/65, pulse 74, respiratory 18, O2 saturation 95% on room air.  Lab studies significant for creatinine 1.50 with GFR 45.  Baseline creatinine of 1.0 with GFR of greater than 60.  WBC 10.8, platelet count 72.  Review of Systems: Review of systems as  noted in the HPI. All other systems reviewed and are negative.   Past Medical History:  Diagnosis Date   Abdominal aortic atherosclerosis 02/11/2022   Acute encephalopathy 05/08/2019   Allergic rhinitis, seasonal 12/04/2011   Aortic atherosclerosis 02/04/2021   Arthritis of finger of both hands 03/27/2021   BPH (benign prostatic hyperplasia)    Cataract    Chronic anticoagulation 12/21/2017   CHADS VASC=4, Eliquis stopped Sept 2021- recurrent falls with SDH   Chronic pain, legs and back 03/10/2017   Preferred pain management Leg and back pain   Compression fracture of thoracic vertebra 09/21/2017   COPD (chronic obstructive pulmonary disease)    2 liters O2 HS   Degeneration of thoracic intervertebral disc 09/21/2017   Degenerative joint disease of hand 07/14/2017   Dupuytren's contracture 07/12/2019   Essential hypertension 12/04/2011   Fatty liver 12/29/2020   Fatty tumor    waste and back   Fibromyalgia    Generalized anxiety disorder 06/12/2020   12/21 He is reporting severe insomnia and anxiety symptoms.  Options are limited.  We will try a low-dose lorazepam at night and during the day for anxiety and panic attacks.  He will stop lorazepam if problems.  He will stop drinking alcohol.  Discontinue BuSpar.  Reduce Cymbalta to 1 a day.   GERD (gastroesophageal reflux disease)    Glaucoma 03/08/2016   Gout 03/08/2016   Hereditary and idiopathic peripheral neuropathy 08/28/2015   History of COVID-19 05/08/2019   2021 Post-COVID sx's -" brain fog" Try [place]'s mane supplement and B complex with niacin for neuropathy   Hyperlipidemia 03/10/2017   Hypothyroidism    Hypoxia    Insomnia    Left-sided sensorineural  hearing loss 07/12/2018   Lumbar spondylosis 02/11/2022   Major depressive disorder 09/07/2016   Mild cognitive impairment of uncertain or unknown etiology 12/27/2020   Obesity (BMI 30.0-34.9) 12/08/2016   Obstructive sleep apnea    Osteoarthritis    Osteoporosis     Pain in joint of right ankle 09/15/2021   Persistent atrial fibrillation    on Eliquis   Recurrent falls 03/25/2020   PT. Treat neuropathy, insomnia.  Reduce Cymbalta to 1 a day.  Discontinue BuSpar.   Rib pain on left side 07/25/2021   Rotator cuff arthropathy of left shoulder 07/21/2019   SDH (subdural hematoma) 03/21/2020   Recurrent SDH secondary to falls at home- Sept 2019 and again 03/14/2020- Eliquis stopped.   Sebaceous cyst 06/27/2019   Spinal compression fracture seventh vertebre   Spondylolisthesis 09/07/2016   On fosamax - for about one year - Dr Alyson Ingles  10/14/17 dexa: normal dexa -- spine 2.1,   RFN -0.9,  LFN   -0.7   - no comparison on file, previous fracture      Thoracic back pain 08/10/2017   Thrombocytopenia 12/20/2020   TIA (transient ischemic attack) 08/28/2015   Type 2 diabetes mellitus 03/08/2016   Past Surgical History:  Procedure Laterality Date   APPENDECTOMY  age 28   [place] Right    early 2000s   CATARACT EXTRACTION     bilateral   CHOLECYSTECTOMY  age 23   EYE SURGERY     FOOT ARTHRODESIS Right 02/02/2013   Procedure: RIGHT HALLUX METATARSAL PHALANGEAL JOINT ARTHRODESIS ;  Surgeon: Wylene Simmer, MD;  Location: [place];  Service: Orthopedics;  Laterality: Right;   INGUINAL HERNIA REPAIR  age 29   rt side   NASAL CONCHA BULLOSA RESECTION  age 40   PROSTATE SURGERY     SHOULDER ARTHROSCOPY W/ ROTATOR CUFF REPAIR Right    early 2000s   tonsil     VASECTOMY  age 67    Social History:  reports that he quit smoking about 47 years ago. His smoking use included cigarettes. He has a 20.00 pack-year smoking history. He has never used smokeless tobacco. He reports current alcohol use of about 14.0 standard drinks of alcohol per week. He reports that he does not use drugs.   Allergies  Allergen Reactions   Other Other (See Comments)    DUST-Other reaction(s): Respiratory distress    Family History  Problem Relation Age of  Onset   Coronary artery disease Brother    Heart disease Father    Lung cancer Father    Kidney cancer Father    Prostate cancer Father    Arthritis Mother    Lung cancer Mother    Dementia Mother        Unspecified type, not Alzheimer's disease      Prior to Admission medications   Medication Sig Start Date End Date Taking? Authorizing Provider  acetaminophen (TYLENOL) 650 MG CR tablet Take 1,300 mg by mouth in the morning and at bedtime.    [provider]  acyclovir ointment (ZOVIRAX) 5 % Apply 1 application topically as needed.    [provider]  allopurinol (ZYLOPRIM) 300 MG tablet Take 1 tablet (300 mg total) by mouth every evening. For gout 09/11/21   Binnie Rail, MD  amiodarone (PACERONE) 200 MG tablet Take 200 mg by mouth daily.    [provider]  amLODipine (NORVASC) 5 MG tablet TAKE 1 TABLET (5 MG TOTAL) BY  MOUTH DAILY. 07/03/22   Lelon Perla, MD  atorvastatin (LIPITOR) 20 MG tablet TAKE 1 TABLET BY MOUTH EVERY DAY FOR CHOLESTEROL 06/29/22   Binnie Rail, MD  blood glucose meter kit and supplies KIT Dispense based on patient and insurance preference. Use daily as directed. (FOR E11.9). 03/10/22   Binnie Rail, MD  Calcium Citrate-Vitamin D (CALCIUM + D PO) Take 1 tablet by mouth daily.    [provider]  carboxymethylcellulose (REFRESH PLUS) 0.5 % SOLN Place 1 drop into both eyes 3 (three) times daily as needed (dry eyes).    [provider]  cephALEXin (KEFLEX) 500 MG capsule Take 1 capsule (500 mg total) by mouth 3 (three) times daily for 10 days. 08/25/22 09/04/22  Binnie Rail, MD  dapagliflozin propanediol (FARXIGA) 10 MG TABS tablet TAKE 1 TABLET (10 MG TOTAL) BY MOUTH DAILY BEFORE BREAKFAST. FOR DIABETES 07/30/22   Binnie Rail, MD  doxycycline (VIBRA-TABS) 100 MG tablet Take 1 tablet (100 mg total) by mouth 2 (two) times daily for 10 days. 08/28/22 09/07/22  Binnie Rail, MD  Emollient (RA RENEWAL DARK SPOT  CORRECTOR EX) Apply 1 application topically daily as needed (dark spots).    [provider]  FLUoxetine (PROZAC) 40 MG capsule Take 1 capsule (40 mg total) by mouth daily. 08/04/22   Binnie Rail, MD  Fluticasone Propionate (ALLERGY RELIEF NA) Place 2 sprays into the nose daily as needed (allergies).    [provider]  hydrochlorothiazide (HYDRODIURIL) 12.5 MG tablet TAKE 1 TABLET BY MOUTH EVERY DAY 03/02/22   Binnie Rail, MD  Lancets MISC Use to check blood sugars daily. E11.9 11/08/20   Binnie Rail, MD  latanoprost (XALATAN) 0.005 % ophthalmic solution Place 1 drop into both eyes at bedtime. 04/18/21   [provider]  levothyroxine (SYNTHROID) 150 MCG tablet Take one tab daily 6 days a week, take 2 tabs once a week 09/02/22   Binnie Rail, MD  Lido-Menthol-Methyl Sal-Camph (CBD [place] EX) Apply topically as needed.    [provider]  Melatonin 5 MG TABS Take 5 mg by mouth at bedtime.    [provider]  Multiple Vitamin (MULTIVITAMIN) tablet Take 1 tablet by mouth daily.    [provider]  nitroGLYCERIN (NITROSTAT) 0.4 MG SL tablet Place 1 tablet (0.4 mg total) under the tongue every 5 (five) minutes as needed for chest pain. 07/31/21   [place], Peter M, MD  omeprazole (PRILOSEC) 20 MG capsule TAKE 1 CAPSULE BY MOUTH EVERY DAY 06/16/22   Binnie Rail, MD  oxyCODONE-acetaminophen (PERCOCET) 10-325 MG tablet Take 1 tablet by mouth every 8 (eight) hours as needed for pain.    [provider]  pregabalin (LYRICA) 75 MG capsule TAKE 1 CAPSULE BY MOUTH TWICE A DAY 05/13/22   Binnie Rail, MD  Probiotic Product (PROBIOTIC PO) Take 1 capsule by mouth daily.    [provider]  RESTASIS 0.05 % ophthalmic emulsion Place 1 drop into both eyes 2 (two) times daily.  09/27/19   [provider]  Semaglutide (RYBELSUS) 7 MG TABS Take 7 mg by mouth daily. Via [employer] pt assistance 03/25/21   Binnie Rail, MD  sildenafil  (REVATIO) 20 MG tablet Take 40-100 mg by mouth daily as needed. 12/01/19   [provider]  silodosin (RAPAFLO) 8 MG CAPS capsule Take 8 mg by mouth daily.    [provider]  telmisartan (MICARDIS) 40  MG tablet Take 1 tablet (40 mg total) by mouth daily. 03/31/22   Lelon Perla, MD  Vibegron (GEMTESA) 75 MG TABS Take 75 mg by mouth daily.    [provider]  vitamin C (ASCORBIC ACID) 500 MG tablet Take 500 mg by mouth daily.    [provider]  VITAMIN D, CHOLECALCIFEROL, PO Take 1 capsule by mouth daily.    [provider]    Physical Exam: BP (!) 148/65   Pulse 74   Temp 97.8 F (36.6 C)   Resp 18   SpO2 95%   General: 89 y.o. year-old male well developed well nourished in no acute distress.  Alert and oriented x3.  Left facial swelling with tenderness on minimal palpation. Cardiovascular: Regular rate and rhythm with no rubs or gallops.  No thyromegaly or JVD noted.  No lower extremity edema. 2/4 pulses in all 4 extremities. Respiratory: Clear to auscultation with no wheezes or rales. Good inspiratory effort. Abdomen: Soft nontender nondistended with normal bowel sounds x4 quadrants. Muskuloskeletal: No cyanosis, clubbing or edema noted bilaterally Neuro: CN II-XII intact, strength, sensation, reflexes Skin: No ulcerative lesions noted or rashes Psychiatry: Judgement and insight appear normal. Mood is appropriate for condition and setting          Labs on Admission:  Basic Metabolic Panel: Recent Labs  Lab 09/02/22 1926  NA 138  K 4.0  CL 105  CO2 23  GLUCOSE 93  BUN 21  CREATININE 1.50*  CALCIUM 8.8*   Liver Function Tests: No results for input(s): "AST", "ALT", "ALKPHOS", "BILITOT", "PROT", "ALBUMIN" in the last 168 hours. No results for input(s): "LIPASE", "AMYLASE" in the last 168 hours. No results for input(s): "AMMONIA" in the last 168 hours. CBC: Recent Labs  Lab 09/02/22 1926  WBC 10.8*  NEUTROABS 6.2  HGB  13.3  HCT 41.8  MCV 91.3  PLT 72*   Cardiac Enzymes: No results for input(s): "CKTOTAL", "CKMB", "CKMBINDEX", "TROPONINI" in the last 168 hours.  BNP (last 3 results) No results for input(s): "BNP" in the last 8760 hours.  ProBNP (last 3 results) No results for input(s): "PROBNP" in the last 8760 hours.  CBG: No results for input(s): "GLUCAP" in the last 168 hours.  Radiological Exams on Admission: CT Maxillofacial W Contrast  Result Date: 09/02/2022 CLINICAL DATA:  Initial evaluation for painful submandibular mass. EXAM: CT MAXILLOFACIAL WITH CONTRAST TECHNIQUE: Multidetector CT imaging of the maxillofacial structures was performed with intravenous contrast. Multiplanar CT image reconstructions were also generated. RADIATION DOSE REDUCTION: This exam was performed according to the departmental dose-optimization program which includes automated exposure control, adjustment of the mA and/or kV according to patient size and/or use of iterative reconstruction technique. CONTRAST:  60mL OMNIPAQUE IOHEXOL 350 MG/ML SOLN COMPARISON:  None Available. FINDINGS: Osseous: No acute osseous finding. No discrete or worrisome osseous lesions. Multilevel degenerative spondylosis and facet arthrosis noted within the visualized spine. Orbits: Prior bilateral ocular lens replacement. Otherwise unremarkable. Sinuses: Small right sphenoid sinus retention cyst noted. Paranasal sinuses are otherwise largely clear. Mastoid air cells and middle ear cavities are well pneumatized and free of fluid. Soft tissues: Localized soft tissue swelling with inflammatory stranding seen involving the lower left face near the angle of the left mandible. Inflammatory changes primarily involve the left masticator and submandibular spaces. Superimposed irregular hypodense collection at this location measures 2.6 x 1.9 x 1.7 cm, consistent with abscess (series 3, image 29). Exact source of this infection not identified by CT. No  significant dental disease is visualized. Salivary glands including the parotid and submandibular glands are within normal limits. Limited intracranial: Age-related cerebral atrophy. Otherwise unremarkable. IMPRESSION: Localized soft tissue swelling with inflammatory stranding involving the lower left face near the angle of the left mandible, consistent with infection/cellulitis. Superimposed 2.6 x 1.9 x 1.7 cm abscess as above. Electronically Signed   By: Jeannine Boga M.D.   On: 09/02/2022 22:11    EKG: I independently viewed the EKG done and my findings are as followed: None available at the time of this visit.  Assessment/Plan Present on Admission: **None**  Principal Problem:   Facial abscess  Left facial abscess, POA CT maxillofacial with contrast revealed findings consistent with infection/cellulitis with superimposed 2.6 x 1.9 x 1.7 cm abscess involving the lower left face near the angle of the left mandible.  Salivary glands including the parotid and submandibular glands are within normal limits. NPO after midnight for possible I&D on 09/03/2022 Continue Unasyn started in the ED Follow MRSA screening test. Pain control in place. Gentle IV fluid hydration.  AKI, suspect prerenal in the setting of poor oral intake, dehydration. Baseline creatinine appears to be 0.9 with GFR greater than 60 Presented with creatinine of 1.50 with GFR 45 Avoid nephrotoxic agents, dehydration and hypotension Gentle IV fluid hydration LR 50 cc/h x 2 days Monitor urine output Repeat BMP in the morning.  Hypothyroidism Resume home levothyroxine.  Type 2 diabetes with hyperlipidemia Obtain hemoglobin A1c Start insulin sliding scale every 4 hours while NPO  Hyperlipidemia Resume home regimen  Chronic anxiety/depression Resume home regimen  Obesity BMI 31 Recommend weight loss outpatient regular physical activity and healthy dieting.   DVT prophylaxis: SCDs, defer pharmacological DVT  prophylaxis to ENT.  Code Status: Full code as stated by the patient himself.  Family Communication: None at bedside.  Disposition Plan: Admitted to telemetry surgical unit.  Consults called: ENT, Dr. Sabino Gasser  Admission status: Inpatient status.   Status is: Inpatient Patient requires at least 2 midnights for further evaluation and treatment of present condition.   Kayleen Memos MD [facility] Pager [phone]  If 7PM-7AM, please contact night-coverage [email or web address] Password [facility]  09/03/2022, 12:40 AM

## 2022-09-03 NOTE — Transfer of Care (Signed)
Immediate Anesthesia Transfer of Care Note  Patient: Jonathan [name]  Procedure(s) Performed: INCISION AND DRAINAGE LEFT FACIAL  ABSCESS (Left)  Patient Location: PACU  Anesthesia Type:General  Level of Consciousness: awake, alert , and oriented  Airway & Oxygen Therapy: Patient Spontanous Breathing  Post-op Assessment: Report given to RN and Post -op Vital signs reviewed and stable  Post vital signs: Reviewed and stable  Last Vitals:  Vitals Value Taken Time  BP 151/68 09/03/22 1603  Temp    Pulse 81 09/03/22 1606  Resp 16 09/03/22 1606  SpO2 96 % 09/03/22 1606  Vitals shown include unvalidated device data.  Last Pain:  Vitals:   09/03/22 1424  TempSrc: Oral  PainSc: 5       Patients Stated Pain Goal: 1 ([id number])  Complications: No notable events documented.

## 2022-09-03 NOTE — Anesthesia Procedure Notes (Signed)
Procedure Name: LMA Insertion Date/Time: 09/03/2022 3:25 PM  Performed by: Reggie Pile, CRNAPre-anesthesia Checklist: Patient identified, Emergency Drugs available, Suction available and Patient being monitored Patient Re-evaluated:Patient Re-evaluated prior to induction Oxygen Delivery Method: Circle system utilized Preoxygenation: Pre-oxygenation with 100% oxygen Induction Type: IV induction LMA Size: 4.0 Number of attempts: 2

## 2022-09-03 NOTE — H&P (Signed)
Jonathan [name] is an 89 y.o. male.    Chief Complaint:  Left neck abscess  HPI: Patient presents today for planned elective procedure for left neck abscess. Attempted procedure in the office under local anesthesia yesterday was not successful due to patient discomfort and depth of abscess. Admitted to medicine service overnight for IV abx and surgery.   Past Medical History:  Diagnosis Date   Abdominal aortic atherosclerosis 02/11/2022   Acute encephalopathy 05/08/2019   Allergic rhinitis, seasonal 12/04/2011   Aortic atherosclerosis 02/04/2021   Arthritis of finger of both hands 03/27/2021   Atrial fibrillation (HCC)    BPH (benign prostatic hyperplasia)    Cataract    Chronic anticoagulation 12/21/2017   CHADS VASC=4, Eliquis stopped Sept 2021- recurrent falls with SDH   Chronic pain, legs and back 03/10/2017   Preferred pain management Leg and back pain   Compression fracture of thoracic vertebra 09/21/2017   COPD (chronic obstructive pulmonary disease)    2 liters O2 HS   Degeneration of thoracic intervertebral disc 09/21/2017   Degenerative joint disease of hand 07/14/2017   Dupuytren's contracture 07/12/2019   Essential hypertension 12/04/2011   Fatty liver 12/29/2020   Fatty tumor    waste and back   Fibromyalgia    Generalized anxiety disorder 06/12/2020   12/21 He is reporting severe insomnia and anxiety symptoms.  Options are limited.  We will try a low-dose lorazepam at night and during the day for anxiety and panic attacks.  He will stop lorazepam if problems.  He will stop drinking alcohol.  Discontinue BuSpar.  Reduce Cymbalta to 1 a day.   GERD (gastroesophageal reflux disease)    Glaucoma 03/08/2016   Gout 03/08/2016   Hereditary and idiopathic peripheral neuropathy 08/28/2015   History of COVID-19 05/08/2019   2021 Post-COVID sx's -" brain fog" Try Lion's mane supplement and B complex with niacin for neuropathy   Hyperlipidemia 03/10/2017   Hypothyroidism     Hypoxia    Insomnia    Left-sided sensorineural hearing loss 07/12/2018   Lumbar spondylosis 02/11/2022   Major depressive disorder 09/07/2016   Mild cognitive impairment of uncertain or unknown etiology 12/27/2020   Obesity (BMI 30.0-34.9) 12/08/2016   Obstructive sleep apnea    Osteoarthritis    Osteoporosis    Pain in joint of right ankle 09/15/2021   Persistent atrial fibrillation    on Eliquis   Recurrent falls 03/25/2020   PT. Treat neuropathy, insomnia.  Reduce Cymbalta to 1 a day.  Discontinue BuSpar.   Rib pain on left side 07/25/2021   Rotator cuff arthropathy of left shoulder 07/21/2019   SDH (subdural hematoma) 03/21/2020   Recurrent SDH secondary to falls at home- Sept 2019 and again 03/14/2020- Eliquis stopped.   Sebaceous cyst 06/27/2019   Spinal compression fracture seventh vertebre   Spondylolisthesis 09/07/2016   On fosamax - for about one year - Dr Alyson Ingles  10/14/17 dexa: normal dexa -- spine 2.1,   RFN -0.9,  LFN   -0.7   - no comparison on file, previous fracture      Thoracic back pain 08/10/2017   Thrombocytopenia 12/20/2020   TIA (transient ischemic attack) 08/28/2015   Type 2 diabetes mellitus 03/08/2016    Past Surgical History:  Procedure Laterality Date   APPENDECTOMY  age 89   CARPAL TUNNEL RELEASE Right    early 2000s   CATARACT EXTRACTION     bilateral   CHOLECYSTECTOMY  age 67   EYE SURGERY  FOOT ARTHRODESIS Right 02/02/2013   Procedure: RIGHT HALLUX METATARSAL PHALANGEAL JOINT ARTHRODESIS ;  Surgeon: Wylene Simmer, MD;  Location: [facility];  Service: Orthopedics;  Laterality: Right;   INGUINAL HERNIA REPAIR  age 22   rt side   NASAL CONCHA BULLOSA RESECTION  age 76   PROSTATE SURGERY     SHOULDER ARTHROSCOPY W/ ROTATOR CUFF REPAIR Right    early 2000s   tonsil     VASECTOMY  age 6    Family History  Problem Relation Age of Onset   Coronary artery disease Brother    Heart disease Father    Lung cancer Father     Kidney cancer Father    Prostate cancer Father    Arthritis Mother    Lung cancer Mother    Dementia Mother        Unspecified type, not Alzheimer's disease    Social History:  reports that he quit smoking about 47 years ago. His smoking use included cigarettes. He has a 20.00 pack-year smoking history. He has been exposed to tobacco smoke. He has never used smokeless tobacco. He reports that he does not currently use alcohol. He reports that he does not use drugs.  Allergies:  Allergies  Allergen Reactions   Other Other (See Comments)    DUST- Respiratory distress    Medications Prior to Admission  Medication Sig Dispense Refill   acetaminophen (TYLENOL) 650 MG CR tablet Take 1,300 mg by mouth in the morning and at bedtime.     acyclovir ointment (ZOVIRAX) 5 % Apply 1 application topically as needed.     allopurinol (ZYLOPRIM) 300 MG tablet Take 1 tablet (300 mg total) by mouth every evening. For gout 90 tablet 3   amiodarone (PACERONE) 200 MG tablet Take 200 mg by mouth daily.     amLODipine (NORVASC) 5 MG tablet TAKE 1 TABLET (5 MG TOTAL) BY MOUTH DAILY. 90 tablet 2   atorvastatin (LIPITOR) 20 MG tablet TAKE 1 TABLET BY MOUTH EVERY DAY FOR CHOLESTEROL 90 tablet 1   blood glucose meter kit and supplies KIT Dispense based on patient and insurance preference. Use daily as directed. (FOR E11.9). 1 each 0   Calcium Citrate-Vitamin D (CALCIUM + D PO) Take 1 tablet by mouth daily.     carboxymethylcellulose (REFRESH PLUS) 0.5 % SOLN Place 1 drop into both eyes 3 (three) times daily as needed (dry eyes).     cephALEXin (KEFLEX) 500 MG capsule Take 1 capsule (500 mg total) by mouth 3 (three) times daily for 10 days. 30 capsule 0   dapagliflozin propanediol (FARXIGA) 10 MG TABS tablet TAKE 1 TABLET (10 MG TOTAL) BY MOUTH DAILY BEFORE BREAKFAST. FOR DIABETES 90 tablet 1   doxycycline (VIBRA-TABS) 100 MG tablet Take 1 tablet (100 mg total) by mouth 2 (two) times daily for 10 days. 20 tablet 0    Emollient (RA RENEWAL DARK SPOT CORRECTOR EX) Apply 1 application topically daily as needed (dark spots).     FLUoxetine (PROZAC) 40 MG capsule Take 1 capsule (40 mg total) by mouth daily. 90 capsule 3   Fluticasone Propionate (ALLERGY RELIEF NA) Place 2 sprays into the nose daily as needed (allergies).     hydrochlorothiazide (HYDRODIURIL) 12.5 MG tablet TAKE 1 TABLET BY MOUTH EVERY DAY 90 tablet 1   Lancets MISC Use to check blood sugars daily. E11.9 100 each 3   latanoprost (XALATAN) 0.005 % ophthalmic solution Place 1 drop into both eyes at bedtime.  levothyroxine (SYNTHROID) 150 MCG tablet Take one tab daily 6 days a week, take 2 tabs once a week 90 tablet 1   Lido-Menthol-Methyl Sal-Camph (CBD KINGS EX) Apply topically as needed.     Melatonin 5 MG TABS Take 5 mg by mouth at bedtime.     Multiple Vitamin (MULTIVITAMIN) tablet Take 1 tablet by mouth daily.     nitroGLYCERIN (NITROSTAT) 0.4 MG SL tablet Place 1 tablet (0.4 mg total) under the tongue every 5 (five) minutes as needed for chest pain. 90 tablet 3   omeprazole (PRILOSEC) 20 MG capsule TAKE 1 CAPSULE BY MOUTH EVERY DAY 90 capsule 1   oxyCODONE-acetaminophen (PERCOCET) 10-325 MG tablet Take 1 tablet by mouth every 8 (eight) hours as needed for pain.     pregabalin (LYRICA) 75 MG capsule TAKE 1 CAPSULE BY MOUTH TWICE A DAY 180 capsule 0   Probiotic Product (PROBIOTIC PO) Take 1 capsule by mouth daily.     RESTASIS 0.05 % ophthalmic emulsion Place 1 drop into both eyes 2 (two) times daily.      Semaglutide (RYBELSUS) 7 MG TABS Take 7 mg by mouth daily. [employer] pt assistance 30 tablet 3   sildenafil (REVATIO) 20 MG tablet Take 40-100 mg by mouth daily as needed.     silodosin (RAPAFLO) 8 MG CAPS capsule Take 8 mg by mouth daily.     telmisartan (MICARDIS) 40 MG tablet Take 1 tablet (40 mg total) by mouth daily. 90 tablet 3   Vibegron (GEMTESA) 75 MG TABS Take 75 mg by mouth daily.     vitamin C (ASCORBIC ACID) 500 MG tablet  Take 500 mg by mouth daily.     VITAMIN D, CHOLECALCIFEROL, PO Take 1 capsule by mouth daily.      Results for orders placed or performed during the hospital encounter of 09/02/22 (from the past 48 hour(s))  Basic metabolic panel     Status: Abnormal   Collection Time: 09/02/22  7:26 PM  Result Value Ref Range   Sodium 138 135 - 145 mmol/L   Potassium 4.0 3.5 - 5.1 mmol/L   Chloride 105 98 - 111 mmol/L   CO2 23 22 - 32 mmol/L   Glucose, Bld 93 70 - 99 mg/dL    Comment: Glucose reference range applies only to samples taken after fasting for at least 8 hours.   BUN 21 8 - 23 mg/dL   Creatinine, Ser 1.50 (H) 0.61 - 1.24 mg/dL   Calcium 8.8 (L) 8.9 - 10.3 mg/dL   GFR, Estimated 45 (L) >60 mL/min    Comment: (NOTE) Calculated using the CKD-EPI Creatinine Equation (2021)    Anion gap 10 5 - 15    Comment: Performed at [facility] [address], [place], [place] [address]  CBC with Differential     Status: Abnormal   Collection Time: 09/02/22  7:26 PM  Result Value Ref Range   WBC 10.8 (H) 4.0 - 10.5 K/uL   RBC 4.58 4.22 - 5.81 MIL/uL   Hemoglobin 13.3 13.0 - 17.0 g/dL   HCT 41.8 39.0 - 52.0 %   MCV 91.3 80.0 - 100.0 fL   MCH 29.0 26.0 - 34.0 pg   MCHC 31.8 30.0 - 36.0 g/dL   RDW 13.9 11.5 - 15.5 %   Platelets 72 (L) 150 - 400 K/uL    Comment: Immature Platelet Fraction may be clinically indicated, consider ordering this additional test [id number] REPEATED TO VERIFY  nRBC 0.0 0.0 - 0.2 %   Neutrophils Relative % 57 %   Neutro Abs 6.2 1.7 - 7.7 K/uL   Lymphocytes Relative 22 %   Lymphs Abs 2.4 0.7 - 4.0 K/uL   Monocytes Relative 20 %   Monocytes Absolute 2.2 (H) 0.1 - 1.0 K/uL   Eosinophils Relative 1 %   Eosinophils Absolute 0.1 0.0 - 0.5 K/uL   Basophils Relative 0 %   Basophils Absolute 0.0 0.0 - 0.1 K/uL   WBC Morphology MORPHOLOGY UNREMARKABLE    RBC Morphology MORPHOLOGY UNREMARKABLE    Smear Review MORPHOLOGY UNREMARKABLE    Abs Immature Granulocytes  0.00 0.00 - 0.07 K/uL    Comment: Performed at [facility], [place] [address], [place], [place] [address]  MRSA Next Gen by PCR, Nasal     Status: None   Collection Time: 09/03/22  1:15 AM   Specimen: Nasal Mucosa; Nasal Swab  Result Value Ref Range   MRSA by PCR Next Gen NOT DETECTED NOT DETECTED    Comment: (NOTE) The GeneXpert MRSA Assay (FDA approved for NASAL specimens only), is one component of a comprehensive MRSA colonization surveillance program. It is not intended to diagnose MRSA infection nor to guide or monitor treatment for MRSA infections. Test performance is not FDA approved in patients less than 56 years old. Performed at [facility], [place] [address], [place], [name] [address]   CBC     Status: Abnormal   Collection Time: 09/03/22  4:47 AM  Result Value Ref Range   WBC 9.7 4.0 - 10.5 K/uL   RBC 4.18 (L) 4.22 - 5.81 MIL/uL   Hemoglobin 12.1 (L) 13.0 - 17.0 g/dL   HCT 37.6 (L) 39.0 - 52.0 %   MCV 90.0 80.0 - 100.0 fL   MCH 28.9 26.0 - 34.0 pg   MCHC 32.2 30.0 - 36.0 g/dL   RDW 13.7 11.5 - 15.5 %   Platelets 65 (L) 150 - 400 K/uL    Comment: Immature Platelet Fraction may be clinically indicated, consider ordering this additional test [id number] REPEATED TO VERIFY    nRBC 0.0 0.0 - 0.2 %    Comment: Performed at [facility], [place] [address], [place], [place] [address]  Comprehensive metabolic panel     Status: Abnormal   Collection Time: 09/03/22  4:47 AM  Result Value Ref Range   Sodium 138 135 - 145 mmol/L   Potassium 3.5 3.5 - 5.1 mmol/L   Chloride 107 98 - 111 mmol/L   CO2 24 22 - 32 mmol/L   Glucose, Bld 93 70 - 99 mg/dL    Comment: Glucose reference range applies only to samples taken after fasting for at least 8 hours.   BUN 20 8 - 23 mg/dL   Creatinine, Ser 1.31 (H) 0.61 - 1.24 mg/dL   Calcium 8.4 (L) 8.9 - 10.3 mg/dL   Total Protein 5.6 (L) 6.5 - 8.1 g/dL   Albumin 3.4 (L) 3.5 - 5.0 g/dL   AST 19 15 - 41 U/L   ALT 17 0 - 44 U/L    Alkaline Phosphatase 82 38 - 126 U/L   Total Bilirubin 0.8 0.3 - 1.2 mg/dL   GFR, Estimated 53 (L) >60 mL/min    Comment: (NOTE) Calculated using the CKD-EPI Creatinine Equation (2021)    Anion gap 7 5 - 15    Comment: Performed at [facility], [place] [address] [place], [place] [address]  Magnesium  Status: None   Collection Time: 09/03/22  4:47 AM  Result Value Ref Range   Magnesium 2.2 1.7 - 2.4 mg/dL    Comment: Performed at [facility], [place] [address], [place], [place] [address]  Phosphorus     Status: None   Collection Time: 09/03/22  4:47 AM  Result Value Ref Range   Phosphorus 3.8 2.5 - 4.6 mg/dL    Comment: Performed at [facility] [address], [place], [place] [address]  Glucose, capillary     Status: None   Collection Time: 09/03/22  7:49 AM  Result Value Ref Range   Glucose-Capillary 78 70 - 99 mg/dL    Comment: Glucose reference range applies only to samples taken after fasting for at least 8 hours.  Glucose, capillary     Status: None   Collection Time: 09/03/22 11:57 AM  Result Value Ref Range   Glucose-Capillary 85 70 - 99 mg/dL    Comment: Glucose reference range applies only to samples taken after fasting for at least 8 hours.   CT Maxillofacial W Contrast  Result Date: 09/02/2022 CLINICAL DATA:  Initial evaluation for painful submandibular mass. EXAM: CT MAXILLOFACIAL WITH CONTRAST TECHNIQUE: Multidetector CT imaging of the maxillofacial structures was performed with intravenous contrast. Multiplanar CT image reconstructions were also generated. RADIATION DOSE REDUCTION: This exam was performed according to the departmental dose-optimization program which includes automated exposure control, adjustment of the mA and/or kV according to patient size and/or use of iterative reconstruction technique. CONTRAST:  26mL OMNIPAQUE IOHEXOL 350 MG/ML SOLN COMPARISON:  None Available. FINDINGS: Osseous: No acute osseous finding. No discrete or  worrisome osseous lesions. Multilevel degenerative spondylosis and facet arthrosis noted within the visualized spine. Orbits: Prior bilateral ocular lens replacement. Otherwise unremarkable. Sinuses: Small right sphenoid sinus retention cyst noted. Paranasal sinuses are otherwise largely clear. Mastoid air cells and middle ear cavities are well pneumatized and free of fluid. Soft tissues: Localized soft tissue swelling with inflammatory stranding seen involving the lower left face near the angle of the left mandible. Inflammatory changes primarily involve the left masticator and submandibular spaces. Superimposed irregular hypodense collection at this location measures 2.6 x 1.9 x 1.7 cm, consistent with abscess (series 3, image 29). Exact source of this infection not identified by CT. No significant dental disease is visualized. Salivary glands including the parotid and submandibular glands are within normal limits. Limited intracranial: Age-related cerebral atrophy. Otherwise unremarkable. IMPRESSION: Localized soft tissue swelling with inflammatory stranding involving the lower left face near the angle of the left mandible, consistent with infection/cellulitis. Superimposed 2.6 x 1.9 x 1.7 cm abscess as above. Electronically Signed   By: Jeannine Boga M.D.   On: 09/02/2022 22:11    ROS: negative other than stated in HPI  Blood pressure (!) 139/53, pulse 61, temperature 98 F (36.7 C), temperature source Oral, resp. rate 15, height 5\' 6"  (1.676 m), weight 85.4 kg, SpO2 93 %.  PHYSICAL EXAM: General: Resting comfortably in NAD  Lungs: Non-labored respiratinos  Studies Reviewed:  CT max-face with contrast 09/02/22 FINDINGS: Osseous: No acute osseous finding. No discrete or worrisome osseous lesions. Multilevel degenerative spondylosis and facet arthrosis noted within the visualized spine.   Orbits: Prior bilateral ocular lens replacement. Otherwise unremarkable.   Sinuses: Small right  sphenoid sinus retention cyst noted. Paranasal sinuses are otherwise largely clear. Mastoid air cells and middle ear cavities are well pneumatized and free of fluid.   Soft tissues: Localized soft tissue swelling with  inflammatory stranding seen involving the lower left face near the angle of the left mandible. Inflammatory changes primarily involve the left masticator and submandibular spaces. Superimposed irregular hypodense collection at this location measures 2.6 x 1.9 x 1.7 cm, consistent with abscess (series 3, image 29). Exact source of this infection not identified by CT. No significant dental disease is visualized. Salivary glands including the parotid and submandibular glands are within normal limits.   Limited intracranial: Age-related cerebral atrophy. Otherwise unremarkable.   IMPRESSION: Localized soft tissue swelling with inflammatory stranding involving the lower left face near the angle of the left mandible, consistent with infection/cellulitis. Superimposed 2.6 x 1.9 x 1.7 cm abscess as above.     Electronically Signed   By: Jeannine Boga M.D.   On: 09/02/2022 22:11    Assessment/Plan Left neck peri-facial abscess, unclear etiology.   Proceed with I&D/washout under GA. Informed consent obtained. R/B/A discussed.  Anticipate ongoing admission 1-3 days for drain care pending clinical course and cultures.    Electronically signed by:  Jenetta Downer, MD  Staff Physician Facial Plastic & Reconstructive Surgery Otolaryngology - Head and Neck Surgery [place]  09/03/2022, 2:23 PM

## 2022-09-03 NOTE — Consult Note (Signed)
ENT BRIEF CONSULT NOTE  ID: 89 y/o M with left facial abscess  CT Max-face with contrast reviewed. Evidence of progression of deep facial abscess from prior suppurative lymph node 08/28/22. Patient is non-toxic, WBC 10.8.   CT 09/02/22 IMPRESSION: Localized soft tissue swelling with inflammatory stranding involving the lower left face near the angle of the left mandible, consistent with infection/cellulitis. Superimposed 2.6 x 1.9 x 1.7 cm abscess as above.     Electronically Signed   By: Jeannine Boga M.D.   On: 09/02/2022 22:11    Admit to medicine for IV abx and add on for surgery , I&D Left facial abscess under GA tomorrow.  Electronically signed by:  Jenetta Downer, MD  Staff Physician Facial Plastic & Reconstructive Surgery Otolaryngology - Head and Neck Surgery [place], [place]

## 2022-09-03 NOTE — Anesthesia Preprocedure Evaluation (Addendum)
Anesthesia Evaluation  Patient identified by MRN, date of birth, ID band Patient awake    Reviewed: Allergy & Precautions, NPO status , Patient's Chart, lab work & pertinent test results  Airway Mallampati: III  TM Distance: >3 FB Neck ROM: Limited    Dental  (+) Poor Dentition, Missing   Pulmonary sleep apnea and Oxygen sleep apnea , COPD, former smoker   Pulmonary exam normal        Cardiovascular hypertension, Pt. on medications + dysrhythmias (off eliquis 05/23) Atrial Fibrillation + pacemaker  Rhythm:Regular Rate:Normal     Neuro/Psych   Anxiety Depression    TIA   GI/Hepatic Neg liver ROS,GERD  Medicated,,  Endo/Other  diabetes, Type 2Hypothyroidism    Renal/GU negative Renal ROS  negative genitourinary   Musculoskeletal  (+) Arthritis ,  Fibromyalgia -  Abdominal Normal abdominal exam  (+)   Peds  Hematology negative hematology ROS (+)   Anesthesia Other Findings Left facial abscess without airway involvement  Reproductive/Obstetrics                             Anesthesia Physical Anesthesia Plan  ASA: 3  Anesthesia Plan: General   Post-op Pain Management:    Induction: Intravenous  PONV Risk Score and Plan: 2 and Ondansetron and Dexamethasone  Airway Management Planned: Mask and LMA  Additional Equipment: None  Intra-op Plan:   Post-operative Plan: Extubation in OR  Informed Consent: I have reviewed the patients History and Physical, chart, labs and discussed the procedure including the risks, benefits and alternatives for the proposed anesthesia with the patient or authorized representative who has indicated his/her understanding and acceptance.     Dental advisory given  Plan Discussed with: CRNA  Anesthesia Plan Comments:        Anesthesia Quick Evaluation

## 2022-09-04 ENCOUNTER — Other Ambulatory Visit: Payer: Self-pay

## 2022-09-04 ENCOUNTER — Encounter (HOSPITAL_COMMUNITY): Payer: Self-pay | Admitting: Otolaryngology

## 2022-09-04 DIAGNOSIS — I480 Paroxysmal atrial fibrillation: Secondary | ICD-10-CM | POA: Diagnosis not present

## 2022-09-04 DIAGNOSIS — L0201 Cutaneous abscess of face: Secondary | ICD-10-CM | POA: Diagnosis not present

## 2022-09-04 LAB — BASIC METABOLIC PANEL
Anion gap: 16 — ABNORMAL HIGH (ref 5–15)
BUN: 22 mg/dL (ref 8–23)
CO2: 20 mmol/L — ABNORMAL LOW (ref 22–32)
Calcium: 8.9 mg/dL (ref 8.9–10.3)
Chloride: 102 mmol/L (ref 98–111)
Creatinine, Ser: 1.2 mg/dL (ref 0.61–1.24)
GFR, Estimated: 59 mL/min — ABNORMAL LOW (ref >60)
Glucose, Bld: 189 mg/dL — ABNORMAL HIGH (ref 70–99)
Potassium: 4.3 mmol/L (ref 3.5–5.1)
Sodium: 138 mmol/L (ref 135–145)

## 2022-09-04 LAB — GLUCOSE, CAPILLARY
Glucose-Capillary: 167 mg/dL — ABNORMAL HIGH (ref 70–99)
Glucose-Capillary: 236 mg/dL — ABNORMAL HIGH (ref 70–99)
Glucose-Capillary: 232 mg/dL — ABNORMAL HIGH (ref 70–99)
Glucose-Capillary: 207 mg/dL — ABNORMAL HIGH (ref 70–99)

## 2022-09-04 LAB — CBC
HCT: 43.8 % (ref 39.0–52.0)
Hemoglobin: 14.1 g/dL (ref 13.0–17.0)
MCH: 28.8 pg (ref 26.0–34.0)
MCHC: 32.2 g/dL (ref 30.0–36.0)
MCV: 89.4 fL (ref 80.0–100.0)
Platelets: 68 10*3/uL — ABNORMAL LOW (ref 150–400)
RBC: 4.9 MIL/uL (ref 4.22–5.81)
RDW: 13.5 % (ref 11.5–15.5)
WBC: 11.5 10*3/uL — ABNORMAL HIGH (ref 4.0–10.5)
nRBC: 0 % (ref 0.0–0.2)

## 2022-09-04 MED ORDER — ENOXAPARIN SODIUM 40 MG/0.4ML IJ SOSY: 40.0000 mg | PREFILLED_SYRINGE | INTRAMUSCULAR | Status: DC

## 2022-09-04 NOTE — Anesthesia Postprocedure Evaluation (Signed)
Anesthesia Post Note  Patient: Jonathan [name]  Procedure(s) Performed: INCISION AND DRAINAGE LEFT FACIAL  ABSCESS (Left: Face)     Patient location during evaluation: PACU Anesthesia Type: General Level of consciousness: awake and alert Pain management: pain level controlled Vital Signs Assessment: post-procedure vital signs reviewed and stable Respiratory status: spontaneous breathing, nonlabored ventilation, respiratory function stable and patient connected to nasal cannula oxygen Cardiovascular status: blood pressure returned to baseline and stable Postop Assessment: no apparent nausea or vomiting Anesthetic complications: no   No notable events documented.  Last Vitals:  Vitals:   09/03/22 2337 09/04/22 0355  BP: (!) 140/70 129/62  Pulse: 67 65  Resp: 17 16  Temp: 36.8 C 36.7 C  SpO2: 96% 96%    Last Pain:  Vitals:   09/04/22 0644  TempSrc:   PainSc: 2                  March Rummage Marlie Kuennen

## 2022-09-04 NOTE — Evaluation (Signed)
Physical Therapy Evaluation Patient Details Name: Jonathan [name] MRN: [id number] DOB: 15-Nov-1932 Today's Date: 09/04/2022  History of Present Illness  Recurrent Falls. Referred by: Billey Gosling, MD  Patient was admitted 9/30 - 10/1 for recurrent falls. As result of fall, he hit L side of his head. CT showed small acute intraventricular hemorrhage within R lateral ventricle.   PMH: pAF on Eliquis, chronic pain on oxycodone and Ambien, previous SDH, COPD on home O2 noct, HTN, hypothyroidism, OSA on CPAP, and DM   Clinical Impression  Jonathan [name] is 89 y.o. male admitted with above HPI and diagnosis. Patient is currently limited by functional impairments below (see PT problem list). Patient lives with his wife and is independent at baseline. Patient evaluated by Physical Therapy with no further acute PT needs identified. He was able to mobilize at supervision level with IV pole for support. He is participant in [facility]. All education has been completed and the patient has no further questions. Of note pt does get confused intermittently on location and situation but often is able to reorient with simple cues. Pt is aware of slight memory deficits and reports beginning to have S&S of dementia. See below for any follow-up Physical Therapy or equipment needs. PT is signing off. Thank you for this referral.        Recommendations for follow up therapy are one component of a multi-disciplinary discharge planning process, led by the attending physician.  Recommendations may be updated based on patient status, additional functional criteria and insurance authorization.  Follow Up Recommendations Outpatient PT (continue at [facility])      Assistance Recommended at Discharge Intermittent Supervision/Assistance  Patient can return home with the following  Help with stairs or ramp for entrance;Assist for transportation;Direct supervision/assist for medications management    Equipment  Recommendations None recommended by PT  Recommendations for Other Services       Functional Status Assessment Patient has had a recent decline in their functional status and demonstrates the ability to make significant improvements in function in a reasonable and predictable amount of time.     Precautions / Restrictions Precautions Precautions: Fall Restrictions Weight Bearing Restrictions: No      Mobility  Bed Mobility               General bed mobility comments: OOB in recliner    Transfers Overall transfer level: Needs assistance Equipment used: None Transfers: Sit to/from Stand Sit to Stand: Supervision           General transfer comment: sup for safety    Ambulation/Gait Ambulation/Gait assistance: Supervision Gait Distance (Feet): 300 Feet Assistive device: IV Pole Gait Pattern/deviations: Step-through pattern Gait velocity: decr     General Gait Details: cues for safe mangement of IV pole, no overt LOB noted throughout.  Stairs            Wheelchair Mobility    Modified Rankin (Stroke Patients Only)       Balance                                             Pertinent Vitals/Pain      Home Living Family/patient expects to be discharged to:: Private residence Living Arrangements: Spouse/significant other Available Help at Discharge:  (ILF at friends home) Type of Home: Independent living facility Home Access: Level entry  Home Layout: One level Home Equipment: Conservation officer, nature (2 wheels);Cane - single point;Wheelchair - Brewing technologist      Prior Function Prior Level of Function : Independent/Modified Independent             Mobility Comments: no AD for gait PTA, pt assists his wife ADLs Comments: pt does the homemaking, cooking, driving, shopping     Hand Dominance   Dominant Hand: Right    Extremity/Trunk Assessment   Upper Extremity Assessment Upper Extremity Assessment: Overall WFL  for tasks assessed    Lower Extremity Assessment Lower Extremity Assessment: Overall WFL for tasks assessed    Cervical / Trunk Assessment Cervical / Trunk Assessment: Normal  Communication   Communication: No difficulties  Cognition Arousal/Alertness: Awake/alert Behavior During Therapy: WFL for tasks assessed/performed Overall Cognitive Status: Within Functional Limits for tasks assessed                                          General Comments      Exercises     Assessment/Plan    PT Assessment All further PT needs can be met in the next venue of care  PT Problem List Decreased balance;Decreased mobility;Decreased cognition       PT Treatment Interventions Gait training;Balance training;Therapeutic exercise;Functional mobility training;Therapeutic activities;Patient/family education;Stair training;DME instruction    PT Goals (Current goals can be found in the Care Plan section)  Acute Rehab PT Goals Patient Stated Goal: get home PT Goal Formulation: With patient Time For Goal Achievement: 09/18/22 Potential to Achieve Goals: Good    Frequency  (1x eval)     Co-evaluation               AM-PAC PT "6 Clicks" Mobility  Outcome Measure Help needed turning from your back to your side while in a flat bed without using bedrails?: None Help needed moving from lying on your back to sitting on the side of a flat bed without using bedrails?: None Help needed moving to and from a bed to a chair (including a wheelchair)?: A Little Help needed standing up from a chair using your arms (e.g., wheelchair or bedside chair)?: None Help needed to walk in hospital room?: A Little Help needed climbing 3-5 steps with a railing? : A Little 6 Click Score: 21    End of Session Equipment Utilized During Treatment: Gait belt Activity Tolerance: Patient tolerated treatment well Patient left: in chair;with call bell/phone within reach Nurse Communication:  Mobility status PT Visit Diagnosis: Unsteadiness on feet (R26.81);History of falling (Z91.81)    Time: [id number] PT Time Calculation (min) (ACUTE ONLY): 30 min   Charges:   PT Evaluation $PT Eval Low Complexity: 1 Low PT Treatments $Gait Training: 8-22 mins        Verner Mould, DPT Acute Rehabilitation Services Office [phone]  09/04/22 3:14 PM

## 2022-09-04 NOTE — Progress Notes (Addendum)
PROGRESS NOTE        PATIENT DETAILS Name: Jonathan [name] Age: 89 y.o. Sex: male Date of Birth: [birth year] Admit Date: 09/02/2022 Admitting Physician Kayleen Memos, DO [id number], Claudina Lick, MD  Brief Summary: Patient is a 89 y.o.  male with history of PAF-no longer on Eliquis since May 2023 due to frequent falls/SDH/thrombocytopenia-who presented to the ED from ENTs office for a left facial abscess.    Significant events: 3/20>> referred to ED from ENTs office for left facial abscess-admit to [facility]  Significant studies: 3/20>> CT maxillofacial: Localized soft tissue swelling near the angle of the mandible consistent with infection/cellulitis-superimposed 2.6 x 1.9 x 1.7 cm abscess.  Significant microbiology data: 3/21>> abscess left face: No growth  Procedures: 3/21>> I&D-left facial abscess  Consults: ENT  Subjective: Inquiring when he could go home-some decrease in left facial swelling post I&D.  Objective: Vitals: Blood pressure (!) 152/72, pulse 69, temperature 97.8 F (36.6 C), temperature source Oral, resp. rate 15, height 5\' 6"  (1.676 m), weight 85.4 kg, SpO2 96 %.   Exam: Gen Exam:Alert awake-not in any distress HEENT:atraumatic, normocephalic.  Some decrease in left facial swelling-dressing in place. Chest: B/L clear to auscultation anteriorly CVS:S1S2 regular Abdomen:soft non tender, non distended Extremities:no edema Neurology: Non focal Skin: no rash  Pertinent Labs/Radiology:    Latest Ref Rng & Units 09/04/2022    5:37 AM 09/03/2022    4:47 AM 09/02/2022    7:26 PM  CBC  WBC 4.0 - 10.5 K/uL 11.5  9.7  10.8   Hemoglobin 13.0 - 17.0 g/dL 14.1  12.1  13.3   Hematocrit 39.0 - 52.0 % 43.8  37.6  41.8   Platelets 150 - 400 K/uL 68  65  72     Lab Results  Component Value Date   NA 138 09/04/2022   K 4.3 09/04/2022   CL 102 09/04/2022   CO2 20 (L) 09/04/2022      Assessment/Plan: Left facial abscess-s/p I&D on 3/21 Some  decrease in facial swelling Pain much better Continue IV Unasyn Follow intra-op cultures Per ENT-continue IV Decadron x 24 hours from 3/21 Per my discussion with ENT-has a drain in place-Home likely over the weekend after ENT eval  AKI Likely hemodynamically mediated Creatinine trending down Avoid nephrotoxic agents Follow electrolytes  PAF Sinus rhythm Continue Amio  No longer on anticoagulation given fall risk/SDH/thrombocytopenia  HLD Statin  HTN BP stable Continue amlodipine Resume other antihypertensives over the next several days  COPD Not in exacerbation Bronchodilators as needed  Hypothyroidism Continue levothyroxine TSH downtrending at 9.8 Defer further changes of levothyroxine dosage to the outpatient setting  Anxiety/depression Appears stable Continue Prozac  Thrombocytopenia At baseline-chronic issue  Peripheral neuropathy Likely due to diabetes Continue Lyrica  Frequent falls History of SDH PT/OT eval  OSA CPAP when able-hopefully after I&D  Obesity: Estimated body mass index is 30.39 kg/m as calculated from the following:   Height as of this encounter: 5\' 6"  (1.676 m).   Weight as of this encounter: 85.4 kg.   Code status:   Code Status: DNR   DVT Prophylaxis: SCDs Start: 09/03/22 0040 Holding off on pharmacological prophylaxis given thrombocytopenia.   Family Communication: None at bedside   Disposition Plan: Status is: Inpatient Remains inpatient appropriate because: severity of illness   Planned Discharge Destination:Home   Diet: Diet Order  Diet heart healthy/carb modified Fluid consistency: Thin  Diet effective now                     Antimicrobial agents: Anti-infectives (From admission, onward)    Start     Dose/Rate Route Frequency Ordered Stop   09/03/22 0600  Ampicillin-Sulbactam (UNASYN) 3 g in sodium chloride 0.9 % 100 mL IVPB        3 g 200 mL/hr over 30 Minutes Intravenous Every 8  hours 09/03/22 0128     09/02/22 2230  Ampicillin-Sulbactam (UNASYN) 3 g in sodium chloride 0.9 % 100 mL IVPB        3 g 200 mL/hr over 30 Minutes Intravenous  Once 09/02/22 2215 09/03/22 0004        MEDICATIONS: Scheduled Meds:  acidophilus  1 capsule Oral Daily   amiodarone  200 mg Oral Daily   amLODipine  5 mg Oral Daily   atorvastatin  20 mg Oral Daily   dexamethasone (DECADRON) injection  8 mg Intravenous Q8H   FLUoxetine  40 mg Oral Daily   insulin aspart  0-9 Units Subcutaneous TID WC   levothyroxine  150 mcg Oral Q0600   pantoprazole  40 mg Oral Daily   pregabalin  75 mg Oral BID   senna-docusate  1 tablet Oral QHS   Continuous Infusions:  ampicillin-sulbactam (UNASYN) IV 3 g (09/04/22 0619)   lactated ringers 50 mL/hr at 09/03/22 0215   PRN Meds:.acetaminophen, HYDROmorphone (DILAUDID) injection, ipratropium-albuterol, melatonin, oxyCODONE, polyethylene glycol, prochlorperazine   I have personally reviewed following labs and imaging studies  LABORATORY DATA: CBC: Recent Labs  Lab 09/02/22 1926 09/03/22 0447 09/04/22 0537  WBC 10.8* 9.7 11.5*  NEUTROABS 6.2  --   --   HGB 13.3 12.1* 14.1  HCT 41.8 37.6* 43.8  MCV 91.3 90.0 89.4  PLT 72* 65* 68*    Basic Metabolic Panel: Recent Labs  Lab 09/02/22 1926 09/03/22 0447 09/04/22 0537  NA 138 138 138  K 4.0 3.5 4.3  CL 105 107 102  CO2 23 24 20*  GLUCOSE 93 93 189*  BUN 21 20 22   CREATININE 1.50* 1.31* 1.20  CALCIUM 8.8* 8.4* 8.9  MG  --  2.2  --   PHOS  --  3.8  --     GFR: Estimated Creatinine Clearance: 44.4 mL/min (by C-G formula based on SCr of 1.2 mg/dL).  Liver Function Tests: Recent Labs  Lab 09/03/22 0447  AST 19  ALT 17  ALKPHOS 82  BILITOT 0.8  PROT 5.6*  ALBUMIN 3.4*   No results for input(s): "LIPASE", "AMYLASE" in the last 168 hours. No results for input(s): "AMMONIA" in the last 168 hours.  Coagulation Profile: No results for input(s): "INR", "PROTIME" in the last 168  hours.  Cardiac Enzymes: No results for input(s): "CKTOTAL", "CKMB", "CKMBINDEX", "TROPONINI" in the last 168 hours.  BNP (last 3 results) No results for input(s): "PROBNP" in the last 8760 hours.  Lipid Profile: No results for input(s): "CHOL", "HDL", "LDLCALC", "TRIG", "CHOLHDL", "LDLDIRECT" in the last 72 hours.  Thyroid Function Tests: Recent Labs    09/01/22 1607  TSH 9.80*    Anemia Panel: No results for input(s): "VITAMINB12", "FOLATE", "FERRITIN", "TIBC", "IRON", "RETICCTPCT" in the last 72 hours.  Urine analysis:    Component Value Date/Time   COLORURINE YELLOW 12/20/2020 1150   APPEARANCEUR CLEAR 12/20/2020 1150   LABSPEC 1.025 12/20/2020 1150   PHURINE 5.5 12/20/2020 1150   GLUCOSEU >=1000 (  A) 12/20/2020 1150   HGBUR NEGATIVE 12/20/2020 1150   BILIRUBINUR NEGATIVE 12/20/2020 1150   BILIRUBINUR neg 11/23/2012 0942   KETONESUR TRACE (A) 12/20/2020 1150   PROTEINUR 100 (A) 03/14/2020 1258   UROBILINOGEN 0.2 12/20/2020 1150   NITRITE NEGATIVE 12/20/2020 1150   LEUKOCYTESUR NEGATIVE 12/20/2020 1150    Sepsis Labs: Lactic Acid, Venous    Component Value Date/Time   LATICACIDVEN 1.2 05/07/2019 1422    MICROBIOLOGY: Recent Results (from the past 240 hour(s))  MRSA Next Gen by PCR, Nasal     Status: None   Collection Time: 09/03/22  1:15 AM   Specimen: Nasal Mucosa; Nasal Swab  Result Value Ref Range Status   MRSA by PCR Next Gen NOT DETECTED NOT DETECTED Final    Comment: (NOTE) The GeneXpert MRSA Assay (FDA approved for NASAL specimens only), is one component of a comprehensive MRSA colonization surveillance program. It is not intended to diagnose MRSA infection nor to guide or monitor treatment for MRSA infections. Test performance is not FDA approved in patients less than 104 years old. Performed at [facility], [place] [address], [place], [place] [address]   Aerobic/Anaerobic Culture w Gram Stain (surgical/deep wound)     Status: None  (Preliminary result)   Collection Time: 09/03/22  3:40 PM   Specimen: Abscess  Result Value Ref Range Status   Specimen Description ABSCESS LEFT FACE  Final   Special Requests NONE  Final   Gram Stain   Final    RARE WBC PRESENT, PREDOMINANTLY PMN NO ORGANISMS SEEN    Culture   Final    NO GROWTH < 12 HOURS Performed at [facility], [place] [address] [place], [place] [address]    Report Status PENDING  Incomplete    RADIOLOGY STUDIES/RESULTS: CT Maxillofacial W Contrast  Result Date: 09/02/2022 CLINICAL DATA:  Initial evaluation for painful submandibular mass. EXAM: CT MAXILLOFACIAL WITH CONTRAST TECHNIQUE: Multidetector CT imaging of the maxillofacial structures was performed with intravenous contrast. Multiplanar CT image reconstructions were also generated. RADIATION DOSE REDUCTION: This exam was performed according to the departmental dose-optimization program which includes automated exposure control, adjustment of the mA and/or kV according to patient size and/or use of iterative reconstruction technique. CONTRAST:  59mL OMNIPAQUE IOHEXOL 350 MG/ML SOLN COMPARISON:  None Available. FINDINGS: Osseous: No acute osseous finding. No discrete or worrisome osseous lesions. Multilevel degenerative spondylosis and facet arthrosis noted within the visualized spine. Orbits: Prior bilateral ocular lens replacement. Otherwise unremarkable. Sinuses: Small right sphenoid sinus retention cyst noted. Paranasal sinuses are otherwise largely clear. Mastoid air cells and middle ear cavities are well pneumatized and free of fluid. Soft tissues: Localized soft tissue swelling with inflammatory stranding seen involving the lower left face near the angle of the left mandible. Inflammatory changes primarily involve the left masticator and submandibular spaces. Superimposed irregular hypodense collection at this location measures 2.6 x 1.9 x 1.7 cm, consistent with abscess (series 3, image 29). Exact source of  this infection not identified by CT. No significant dental disease is visualized. Salivary glands including the parotid and submandibular glands are within normal limits. Limited intracranial: Age-related cerebral atrophy. Otherwise unremarkable. IMPRESSION: Localized soft tissue swelling with inflammatory stranding involving the lower left face near the angle of the left mandible, consistent with infection/cellulitis. Superimposed 2.6 x 1.9 x 1.7 cm abscess as above. Electronically Signed   By: Jeannine Boga M.D.   On: 09/02/2022 22:11     LOS: 1 day  Oren Binet, MD  [facility]    To contact the attending provider between 7A-7P or the covering provider during after hours 7P-7A, please log into the web site [email or web address] and access using universal [place] password for that web site. If you do not have the password, please call the hospital operator.  09/04/2022, 11:47 AM

## 2022-09-04 NOTE — Progress Notes (Signed)
OT Cancellation Note  Patient Details Name: Jonathan [name] MRN: [id number] DOB: [birth year]   Cancelled Treatment:    Reason Eval/Treat Not Completed: OT screened, no needs identified, will sign off Pt sitting EOB eating breakfast. Per RN, pt is steady on his feet and pt also denies any concerns with daily task mgmt. Pt reports having multiple DME at home that he can use if needed. No formal OT eval needed at this time. Please reconsult if needs change.   Layla Maw 09/04/2022, 8:34 AM

## 2022-09-05 ENCOUNTER — Encounter: Payer: Self-pay | Admitting: Internal Medicine

## 2022-09-05 ENCOUNTER — Inpatient Hospital Stay (HOSPITAL_COMMUNITY): Payer: Medicare HMO

## 2022-09-05 ENCOUNTER — Other Ambulatory Visit: Payer: Self-pay

## 2022-09-05 DIAGNOSIS — L0201 Cutaneous abscess of face: Secondary | ICD-10-CM | POA: Diagnosis not present

## 2022-09-05 LAB — BASIC METABOLIC PANEL
Anion gap: 13 (ref 5–15)
BUN: 32 mg/dL — ABNORMAL HIGH (ref 8–23)
CO2: 25 mmol/L (ref 22–32)
Calcium: 8.8 mg/dL — ABNORMAL LOW (ref 8.9–10.3)
Chloride: 100 mmol/L (ref 98–111)
Creatinine, Ser: 1.32 mg/dL — ABNORMAL HIGH (ref 0.61–1.24)
GFR, Estimated: 52 mL/min — ABNORMAL LOW (ref >60)
Glucose, Bld: 168 mg/dL — ABNORMAL HIGH (ref 70–99)
Potassium: 3.9 mmol/L (ref 3.5–5.1)
Sodium: 138 mmol/L (ref 135–145)

## 2022-09-05 LAB — GLUCOSE, CAPILLARY
Glucose-Capillary: 163 mg/dL — ABNORMAL HIGH (ref 70–99)
Glucose-Capillary: 128 mg/dL — ABNORMAL HIGH (ref 70–99)
Glucose-Capillary: 121 mg/dL — ABNORMAL HIGH (ref 70–99)
Glucose-Capillary: 182 mg/dL — ABNORMAL HIGH (ref 70–99)

## 2022-09-05 LAB — PROCALCITONIN: Procalcitonin: <0.1 ng/mL

## 2022-09-05 LAB — CBC
HCT: 37.3 % — ABNORMAL LOW (ref 39.0–52.0)
Hemoglobin: 12.2 g/dL — ABNORMAL LOW (ref 13.0–17.0)
MCH: 29 pg (ref 26.0–34.0)
MCHC: 32.7 g/dL (ref 30.0–36.0)
MCV: 88.6 fL (ref 80.0–100.0)
Platelets: 65 10*3/uL — ABNORMAL LOW (ref 150–400)
RBC: 4.21 MIL/uL — ABNORMAL LOW (ref 4.22–5.81)
RDW: 13.6 % (ref 11.5–15.5)
WBC: 24.8 10*3/uL — ABNORMAL HIGH (ref 4.0–10.5)
nRBC: 0 % (ref 0.0–0.2)

## 2022-09-05 LAB — MAGNESIUM: Magnesium: 2.1 mg/dL (ref 1.7–2.4)

## 2022-09-05 LAB — C-REACTIVE PROTEIN: CRP: 1.4 mg/dL — ABNORMAL HIGH (ref <1.0)

## 2022-09-05 MED ORDER — VITAMIN D (CHOLECALCIFEROL) 25 MCG (1000 UT) PO TABS: ORAL_TABLET | Freq: Every day | ORAL | Status: DC

## 2022-09-05 MED ORDER — NITROGLYCERIN 0.4 MG SL SUBL: 0.4000 mg | SUBLINGUAL_TABLET | SUBLINGUAL | Status: DC | PRN

## 2022-09-05 MED ORDER — HEPARIN SODIUM (PORCINE) 5000 UNIT/ML IJ SOLN: 5000.0000 [IU] | Freq: Three times a day (TID) | INTRAMUSCULAR | Status: DC

## 2022-09-05 MED ORDER — VITAMIN C 500 MG PO TABS
500.0000 mg | ORAL_TABLET | Freq: Every day | ORAL | Status: DC
  Administered 2022-09-05 – 2022-09-09 (×5): 500 mg via ORAL
  Filled 2022-09-05 (×5): qty 1

## 2022-09-05 NOTE — Progress Notes (Signed)
Subjective: Feels good.  No malaise.  No pain.  Objective: Vital signs in last 24 hours: Temp:  [97.4 F (36.3 C)-99.5 F (37.5 C)] 98.2 F (36.8 C) (03/23 1140) Pulse Rate:  [50-91] 50 (03/23 1140) Resp:  [16-18] 18 (03/23 1140) BP: (123-149)/(53-72) 127/56 (03/23 1140) SpO2:  [92 %-96 %] 96 % (03/23 1140) Wt Readings from Last 1 Encounters:  09/03/22 85.4 kg    Intake/Output from previous day: 03/22 0701 - 03/23 0700 In: 240 [P.O.:240] Out: 550 [Urine:550] Intake/Output this shift: Total I/O In: -  Out: 225 [Urine:225]  General appearance: alert, cooperative, and no distress Neck: left angle of mandible firm, prominent edema, no tenderness, Penrose drain in place with scant drainage  Recent Labs    09/04/22 0537 09/05/22 0240  WBC 11.5* 24.8*  HGB 14.1 12.2*  HCT 43.8 37.3*  PLT 68* 65*    Recent Labs    09/04/22 0537 09/05/22 0605  NA 138 138  K 4.3 3.9  CL 102 100  CO2 20* 25  GLUCOSE 189* 168*  BUN 22 32*  CREATININE 1.20 1.32*  CALCIUM 8.9 8.8*    Medications: I have reviewed the patient's current medications.  Assessment/Plan: Left upper neck abscess s/p I&D  The left neck area remains firm and prominent.  Very little drainage.  Increase in WBC noted but has received steroids.  No fever, no malaise.  Receiving Unasyn.  Will leave drain in place today.  Please, no more steroids so WBC trend can be more valid.  Culture remains pending.   LOS: 2 days   Melida Quitter 09/05/2022, 12:58 PM

## 2022-09-05 NOTE — Progress Notes (Signed)
Received Imminent discharge PT eval and treat order today, 09/05/22. However, a PT Evaluation had already been completed yesterday, 09/04/22. At that time, no further acute PT needs were identified and PT signed off, recommending pt resume [facility]PP[facility] at [facility] upon d/c home with family support. Pt did not appear to have a medical change over night per chart, thus PT will sign off and defer from completing another PT Eval at this time. If there are any concerns or questions, please contact acute PT.   Moishe Spice, PT, DPT Acute Rehabilitation Services  Office: [phone]

## 2022-09-05 NOTE — Progress Notes (Signed)
PROGRESS NOTE        PATIENT DETAILS Name: Jonathan [name] Age: 89 y.o. Sex: male Date of Birth: [birth year] Admit Date: 09/02/2022 Admitting Physician Kayleen Memos, DO [id number], Claudina Lick, MD  Brief Summary: Patient is a 89 y.o.  male with history of PAF-no longer on Eliquis since May 2023 due to frequent falls/SDH/thrombocytopenia-who presented to the ED from ENTs office for a left facial abscess.    Significant events: 3/20>> referred to ED from ENTs office for left facial abscess-admit to [facility]  Significant studies: 3/20>> CT maxillofacial: Localized soft tissue swelling near the angle of the mandible consistent with infection/cellulitis-superimposed 2.6 x 1.9 x 1.7 cm abscess.  Significant microbiology data: 3/21>> abscess left face: No growth  Procedures: 3/21>> I&D-left facial abscess  Consults: ENT  Subjective: Patient in bed, appears comfortable, denies any headache, no fever, no chest pain or pressure, no shortness of breath , no abdominal pain. No focal weakness.  Objective: Vitals: Blood pressure 129/72, pulse 91, temperature 97.7 F (36.5 C), temperature source Oral, resp. rate 18, height 5\' 6"  (1.676 m), weight 85.4 kg, SpO2 92 %.   Exam:  Awake Alert, No new F.N deficits, still has left facial swelling, postop site under bandage with drain in place [place].AT,PERRAL Supple Neck, No JVD,   Symmetrical Chest wall movement, Good air movement bilaterally, CTAB RRR,No Gallops, Rubs or new Murmurs,  +ve B.Sounds, Abd Soft, No tenderness,   No Cyanosis, Clubbing or edema    Assessment/Plan:  Left facial abscess-s/p I&D on 3/21 Some decrease in facial swelling Continue IV Unasyn Follow intra-op cultures Received 2 doses of postop Decadron, does have leukocytosis which could be due to steroids, afebrile with stable procalcitonin, drain still in place Case discussed with ENT physician Dr. Redmond Baseman who will evaluate him today.  If stable from  ENT standpoint may go home in the next 1 to 2 days.   AKI Likely hemodynamically mediated Creatinine trending down Avoid nephrotoxic agents Follow electrolytes  PAF Sinus rhythm Continue Amio  No longer on anticoagulation given fall risk/SDH/thrombocytopenia  HLD Statin  HTN BP stable Continue amlodipine Resume other antihypertensives over the next several days  COPD Not in exacerbation Bronchodilators as needed  Hypothyroidism Continue levothyroxine TSH downtrending at 9.8 Defer further changes of levothyroxine dosage to the outpatient setting  Anxiety/depression Appears stable Continue Prozac  Thrombocytopenia At baseline-chronic issue  Peripheral neuropathy Likely due to diabetes Continue Lyrica  Frequent falls History of SDH PT/OT eval  OSA CPAP when able-hopefully after I&D  Obesity: Estimated body mass index is 30.39 kg/m as calculated from the following:   Height as of this encounter: 5\' 6"  (1.676 m).   Weight as of this encounter: 85.4 kg.   Code status:   Code Status: DNR   DVT Prophylaxis: Place and maintain sequential compression device Start: 09/05/22 1016 SCDs Start: 09/03/22 0040 Holding off on pharmacological prophylaxis given thrombocytopenia.   Family Communication: None at bedside   Disposition Plan: Status is: Inpatient Remains inpatient appropriate because: severity of illness   Planned Discharge Destination:Home   Diet: Diet Order             Diet heart healthy/carb modified Fluid consistency: Thin  Diet effective now                     Antimicrobial agents: Anti-infectives (From  admission, onward)    Start     Dose/Rate Route Frequency Ordered Stop   09/03/22 0600  Ampicillin-Sulbactam (UNASYN) 3 g in sodium chloride 0.9 % 100 mL IVPB        3 g 200 mL/hr over 30 Minutes Intravenous Every 8 hours 09/03/22 0128     09/02/22 2230  Ampicillin-Sulbactam (UNASYN) 3 g in sodium chloride 0.9 % 100 mL IVPB         3 g 200 mL/hr over 30 Minutes Intravenous  Once 09/02/22 2215 09/03/22 0004        MEDICATIONS: Scheduled Meds:  acidophilus  1 capsule Oral Daily   amiodarone  200 mg Oral Daily   amLODipine  5 mg Oral Daily   ascorbic acid  500 mg Oral Daily   atorvastatin  20 mg Oral Daily   FLUoxetine  40 mg Oral Daily   insulin aspart  0-9 Units Subcutaneous TID WC   levothyroxine  150 mcg Oral Q0600   pantoprazole  40 mg Oral Daily   pregabalin  75 mg Oral BID   senna-docusate  1 tablet Oral QHS   Vitamin D (Cholecalciferol)   Oral Daily   Continuous Infusions:  ampicillin-sulbactam (UNASYN) IV 3 g (09/05/22 0638)   PRN Meds:.acetaminophen, ipratropium-albuterol, melatonin, nitroGLYCERIN, oxyCODONE, polyethylene glycol, prochlorperazine   I have personally reviewed following labs and imaging studies  LABORATORY DATA:  Recent Labs  Lab 09/02/22 1926 09/03/22 0447 09/04/22 0537 09/05/22 0240  WBC 10.8* 9.7 11.5* 24.8*  HGB 13.3 12.1* 14.1 12.2*  HCT 41.8 37.6* 43.8 37.3*  PLT 72* 65* 68* 65*  MCV 91.3 90.0 89.4 88.6  MCH 29.0 28.9 28.8 29.0  MCHC 31.8 32.2 32.2 32.7  RDW 13.9 13.7 13.5 13.6  LYMPHSABS 2.4  --   --   --   MONOABS 2.2*  --   --   --   EOSABS 0.1  --   --   --   BASOSABS 0.0  --   --   --     Recent Labs  Lab 09/01/22 1607 09/02/22 1926 09/03/22 0447 09/04/22 0537 09/05/22 0605  NA  --  138 138 138 138  K  --  4.0 3.5 4.3 3.9  CL  --  105 107 102 100  CO2  --  23 24 20* 25  ANIONGAP  --  10 7 16* 13  GLUCOSE  --  93 93 189* 168*  BUN  --  21 20 22  32*  CREATININE  --  1.50* 1.31* 1.20 1.32*  AST  --   --  19  --   --   ALT  --   --  17  --   --   ALKPHOS  --   --  82  --   --   BILITOT  --   --  0.8  --   --   ALBUMIN  --   --  3.4*  --   --   CRP  --   --   --   --  1.4*  PROCALCITON  --   --   --   --  <0.10  TSH 9.80*  --   --   --   --   MG  --   --  2.2  --  2.1  CALCIUM  --  8.8* 8.4* 8.9 8.8*      Recent Labs  Lab  09/01/22 1607 09/02/22 1926 09/03/22 0447 09/04/22 0537 09/05/22 [id number]  CRP  --   --   --   --  1.4*  PROCALCITON  --   --   --   --  <0.10  TSH 9.80*  --   --   --   --   MG  --   --  2.2  --  2.1  CALCIUM  --  8.8* 8.4* 8.9 8.8*   MICROBIOLOGY: Recent Results (from the past 240 hour(s))  MRSA Next Gen by PCR, Nasal     Status: None   Collection Time: 09/03/22  1:15 AM   Specimen: Nasal Mucosa; Nasal Swab  Result Value Ref Range Status   MRSA by PCR Next Gen NOT DETECTED NOT DETECTED Final    Comment: (NOTE) The GeneXpert MRSA Assay (FDA approved for NASAL specimens only), is one component of a comprehensive MRSA colonization surveillance program. It is not intended to diagnose MRSA infection nor to guide or monitor treatment for MRSA infections. Test performance is not FDA approved in patients less than 47 years old. Performed at [facility], [place] [address], [place], [place] [address]   Aerobic/Anaerobic Culture w Gram Stain (surgical/deep wound)     Status: None (Preliminary result)   Collection Time: 09/03/22  3:40 PM   Specimen: Abscess  Result Value Ref Range Status   Specimen Description ABSCESS LEFT FACE  Final   Special Requests NONE  Final   Gram Stain   Final    RARE WBC PRESENT, PREDOMINANTLY PMN NO ORGANISMS SEEN    Culture   Final    NO GROWTH < 12 HOURS Performed at [facility], [place] [address] [place], [place] [address]    Report Status PENDING  Incomplete    RADIOLOGY STUDIES/RESULTS: DG Chest Port 1 View  Result Date: 09/05/2022 CLINICAL DATA:  Shortness of breath EXAM: PORTABLE CHEST - 1 VIEW COMPARISON:  01/27/2022 FINDINGS: Lungs are clear. Heart size and mediastinal contours are within normal limits. Aortic Atherosclerosis (ICD10-170.0). Monitoring device overlies the left hemithorax. Mild blunting of the right lateral costophrenic angle. Visualized bones unremarkable. IMPRESSION: Possible small right effusion. No acute findings.  Electronically Signed   By: Lucrezia Europe M.D.   On: 09/05/2022 07:49     LOS: 2 days   Signature  -    Lala Lund M.D on 09/05/2022 at 10:20 AM   -  To page go to [email or web address]

## 2022-09-06 ENCOUNTER — Other Ambulatory Visit: Payer: Self-pay | Admitting: Internal Medicine

## 2022-09-06 DIAGNOSIS — L0201 Cutaneous abscess of face: Secondary | ICD-10-CM | POA: Diagnosis not present

## 2022-09-06 LAB — GLUCOSE, CAPILLARY
Glucose-Capillary: 89 mg/dL (ref 70–99)
Glucose-Capillary: 85 mg/dL (ref 70–99)
Glucose-Capillary: 159 mg/dL — ABNORMAL HIGH (ref 70–99)

## 2022-09-06 LAB — CBC WITH DIFFERENTIAL/PLATELET
Abs Immature Granulocytes: 0.8 10*3/uL — ABNORMAL HIGH (ref 0.00–0.07)
Basophils Absolute: 0 10*3/uL (ref 0.0–0.1)
Basophils Relative: 0 %
Eosinophils Absolute: 0 10*3/uL (ref 0.0–0.5)
Eosinophils Relative: 0 %
HCT: 38 % — ABNORMAL LOW (ref 39.0–52.0)
Hemoglobin: 11.9 g/dL — ABNORMAL LOW (ref 13.0–17.0)
Lymphocytes Relative: 10 %
Lymphs Abs: 1.9 10*3/uL (ref 0.7–4.0)
MCH: 28.5 pg (ref 26.0–34.0)
MCHC: 31.3 g/dL (ref 30.0–36.0)
MCV: 91.1 fL (ref 80.0–100.0)
Monocytes Absolute: 2.6 10*3/uL — ABNORMAL HIGH (ref 0.1–1.0)
Monocytes Relative: 14 %
Myelocytes: 3 %
Neutro Abs: 13.6 10*3/uL — ABNORMAL HIGH (ref 1.7–7.7)
Neutrophils Relative %: 72 %
Platelets: 62 10*3/uL — ABNORMAL LOW (ref 150–400)
Promyelocytes Relative: 1 %
RBC: 4.17 MIL/uL — ABNORMAL LOW (ref 4.22–5.81)
RDW: 13.8 % (ref 11.5–15.5)
Smear Review: DECREASED
WBC: 18.9 10*3/uL — ABNORMAL HIGH (ref 4.0–10.5)
nRBC: 0 % (ref 0.0–0.2)

## 2022-09-06 LAB — BASIC METABOLIC PANEL
Anion gap: 8 (ref 5–15)
BUN: 33 mg/dL — ABNORMAL HIGH (ref 8–23)
CO2: 24 mmol/L (ref 22–32)
Calcium: 8.7 mg/dL — ABNORMAL LOW (ref 8.9–10.3)
Chloride: 107 mmol/L (ref 98–111)
Creatinine, Ser: 1.21 mg/dL (ref 0.61–1.24)
GFR, Estimated: 58 mL/min — ABNORMAL LOW (ref >60)
Glucose, Bld: 103 mg/dL — ABNORMAL HIGH (ref 70–99)
Potassium: 3.8 mmol/L (ref 3.5–5.1)
Sodium: 139 mmol/L (ref 135–145)

## 2022-09-06 LAB — MAGNESIUM: Magnesium: 2.3 mg/dL (ref 1.7–2.4)

## 2022-09-06 LAB — C-REACTIVE PROTEIN: CRP: 0.7 mg/dL (ref <1.0)

## 2022-09-06 LAB — BRAIN NATRIURETIC PEPTIDE: B Natriuretic Peptide: 298.8 pg/mL — ABNORMAL HIGH (ref 0.0–100.0)

## 2022-09-06 LAB — PROCALCITONIN: Procalcitonin: 0.1 ng/mL

## 2022-09-06 MED ORDER — LACTATED RINGERS IV SOLN: INTRAVENOUS | Status: AC

## 2022-09-06 MED ORDER — VANCOMYCIN HCL IN DEXTROSE 1-5 GM/200ML-% IV SOLN
1000.0000 mg | INTRAVENOUS | Status: DC
  Administered 2022-09-06 – 2022-09-07 (×2): 1000 mg via INTRAVENOUS
  Filled 2022-09-06 (×2): qty 200

## 2022-09-06 NOTE — Progress Notes (Signed)
PROGRESS NOTE        PATIENT DETAILS Name: Jonathan [name] Age: 89 y.o. Sex: male Date of Birth: 10/20/1932 Admit Date: 09/02/2022 Admitting Physician Kayleen Memos, DO [id number], Claudina Lick, MD  Brief Summary: Patient is a 89 y.o.  male with history of PAF-no longer on Eliquis since May 2023 due to frequent falls/SDH/thrombocytopenia-who presented to the ED from ENTs office for a left facial abscess.    Significant events: 3/20>> referred to ED from ENTs office for left facial abscess-admit to [facility]  Significant studies: 3/20>> CT maxillofacial: Localized soft tissue swelling near the angle of the mandible consistent with infection/cellulitis-superimposed 2.6 x 1.9 x 1.7 cm abscess.  Significant microbiology data: 3/21>> abscess left face: No growth  Procedures: 3/21>> I&D-left facial abscess  Consults: ENT  Subjective:  Patient in bed, appears comfortable, denies any headache, no fever, no chest pain or pressure, no shortness of breath , no abdominal pain. No focal weakness.  Will has left sided facial swelling and some discomfort.  Objective: Vitals: Blood pressure (!) 144/61, pulse (!) 52, temperature 98.1 F (36.7 C), temperature source Oral, resp. rate 16, height 5\' 6"  (1.676 m), weight 85.4 kg, SpO2 94 %.   Exam:  Awake Alert, No new F.N deficits, still has left facial swelling, postop site under bandage with drain in place [place].AT,PERRAL Supple Neck, No JVD,   Symmetrical Chest wall movement, Good air movement bilaterally, CTAB RRR,No Gallops, Rubs or new Murmurs,  +ve B.Sounds, Abd Soft, No tenderness,   No Cyanosis, Clubbing or edema    Assessment/Plan:  Left facial abscess-s/p I&D on 3/21 Some decrease in facial swelling Continue IV Unasyn Follow intra-op cultures prelim cultures growing staph species Received 2 doses of postop Decadron, does have leukocytosis along with significant left-sided facial and submandibular swelling, postop  drain still in place, Dr. Redmond Baseman ENT following the patient case discussed with him on 09/05/2022 and 09/06/2022.  Based on prelim results will add Vanco as well, continue to monitor with combination of vancomycin and Unasyn, if no better tomorrow then will reimage.   AKI Likely hemodynamically mediated Creatinine trending down Avoid nephrotoxic agents Follow electrolytes  PAF Sinus rhythm Continue Amio  No longer on anticoagulation given fall risk/SDH/thrombocytopenia  HLD Statin  HTN BP stable Continue amlodipine Resume other antihypertensives over the next several days  COPD Not in exacerbation Bronchodilators as needed  Hypothyroidism Continue levothyroxine TSH downtrending at 9.8 Defer further changes of levothyroxine dosage to the outpatient setting  Anxiety/depression Appears stable Continue Prozac  Thrombocytopenia At baseline-chronic issue  Peripheral neuropathy Likely due to diabetes Continue Lyrica  Frequent falls History of SDH PT/OT eval  OSA CPAP when able-hopefully after I&D  Obesity: Estimated body mass index is 30.39 kg/m as calculated from the following:   Height as of this encounter: 5\' 6"  (1.676 m).   Weight as of this encounter: 85.4 kg.   Code status:   Code Status: DNR   DVT Prophylaxis: Place and maintain sequential compression device Start: 09/05/22 1016 SCDs Start: 09/03/22 0040 Holding off on pharmacological prophylaxis given thrombocytopenia.   Family Communication: None at bedside   Disposition Plan: Status is: Inpatient Remains inpatient appropriate because: severity of illness   Planned Discharge Destination:Home   Diet: Diet Order             Diet heart healthy/carb modified Fluid consistency:  Thin  Diet effective now                     Antimicrobial agents: Anti-infectives (From admission, onward)    Start     Dose/Rate Route Frequency Ordered Stop   09/03/22 0600  Ampicillin-Sulbactam (UNASYN) 3  g in sodium chloride 0.9 % 100 mL IVPB        3 g 200 mL/hr over 30 Minutes Intravenous Every 8 hours 09/03/22 0128     09/02/22 2230  Ampicillin-Sulbactam (UNASYN) 3 g in sodium chloride 0.9 % 100 mL IVPB        3 g 200 mL/hr over 30 Minutes Intravenous  Once 09/02/22 2215 09/03/22 0004        MEDICATIONS: Scheduled Meds:  acidophilus  1 capsule Oral Daily   amiodarone  200 mg Oral Daily   amLODipine  5 mg Oral Daily   ascorbic acid  500 mg Oral Daily   atorvastatin  20 mg Oral Daily   FLUoxetine  40 mg Oral Daily   insulin aspart  0-9 Units Subcutaneous TID WC   levothyroxine  150 mcg Oral Q0600   pantoprazole  40 mg Oral Daily   pregabalin  75 mg Oral BID   senna-docusate  1 tablet Oral QHS   Continuous Infusions:  ampicillin-sulbactam (UNASYN) IV 3 g (09/06/22 0529)   PRN Meds:.acetaminophen, ipratropium-albuterol, melatonin, nitroGLYCERIN, oxyCODONE, polyethylene glycol, prochlorperazine   I have personally reviewed following labs and imaging studies  LABORATORY DATA:  Recent Labs  Lab 09/02/22 1926 09/03/22 0447 09/04/22 0537 09/05/22 0240 09/06/22 0332  WBC 10.8* 9.7 11.5* 24.8* 18.9*  HGB 13.3 12.1* 14.1 12.2* 11.9*  HCT 41.8 37.6* 43.8 37.3* 38.0*  PLT 72* 65* 68* 65* 62*  MCV 91.3 90.0 89.4 88.6 91.1  MCH 29.0 28.9 28.8 29.0 28.5  MCHC 31.8 32.2 32.2 32.7 31.3  RDW 13.9 13.7 13.5 13.6 13.8  LYMPHSABS 2.4  --   --   --  1.9  MONOABS 2.2*  --   --   --  2.6*  EOSABS 0.1  --   --   --  0.0  BASOSABS 0.0  --   --   --  0.0    Recent Labs  Lab 09/01/22 1607 09/02/22 1926 09/03/22 0447 09/04/22 0537 09/05/22 0605 09/06/22 0332  NA  --  138 138 138 138 139  K  --  4.0 3.5 4.3 3.9 3.8  CL  --  105 107 102 100 107  CO2  --  23 24 20* 25 24  ANIONGAP  --  10 7 16* 13 8  GLUCOSE  --  93 93 189* 168* 103*  BUN  --  21 20 22  32* 33*  CREATININE  --  1.50* 1.31* 1.20 1.32* 1.21  AST  --   --  19  --   --   --   ALT  --   --  17  --   --   --    ALKPHOS  --   --  82  --   --   --   BILITOT  --   --  0.8  --   --   --   ALBUMIN  --   --  3.4*  --   --   --   CRP  --   --   --   --  1.4* 0.7  PROCALCITON  --   --   --   --  <  0.10 0.10  TSH 9.80*  --   --   --   --   --   BNP  --   --   --   --   --  298.8*  MG  --   --  2.2  --  2.1 2.3  CALCIUM  --  8.8* 8.4* 8.9 8.8* 8.7*      Recent Labs  Lab 09/01/22 1607 09/02/22 1926 09/03/22 0447 09/04/22 0537 09/05/22 0605 09/06/22 0332  CRP  --   --   --   --  1.4* 0.7  PROCALCITON  --   --   --   --  <0.10 0.10  TSH 9.80*  --   --   --   --   --   BNP  --   --   --   --   --  298.8*  MG  --   --  2.2  --  2.1 2.3  CALCIUM  --  8.8* 8.4* 8.9 8.8* 8.7*   MICROBIOLOGY: Recent Results (from the past 240 hour(s))  MRSA Next Gen by PCR, Nasal     Status: None   Collection Time: 09/03/22  1:15 AM   Specimen: Nasal Mucosa; Nasal Swab  Result Value Ref Range Status   MRSA by PCR Next Gen NOT DETECTED NOT DETECTED Final    Comment: (NOTE) The GeneXpert MRSA Assay (FDA approved for NASAL specimens only), is one component of a comprehensive MRSA colonization surveillance program. It is not intended to diagnose MRSA infection nor to guide or monitor treatment for MRSA infections. Test performance is not FDA approved in patients less than 30 years old. Performed at [facility], [place] [address], [place], [place] [address]   Aerobic/Anaerobic Culture w Gram Stain (surgical/deep wound)     Status: None (Preliminary result)   Collection Time: 09/03/22  3:40 PM   Specimen: Abscess  Result Value Ref Range Status   Specimen Description ABSCESS LEFT FACE  Final   Special Requests NONE  Final   Gram Stain   Final    RARE WBC PRESENT, PREDOMINANTLY PMN NO ORGANISMS SEEN Performed at [facility], [place] [address], [place], [place] [address]    Culture   Final    RARE STAPHYLOCOCCUS AUREUS SUSCEPTIBILITIES TO FOLLOW NO ANAEROBES ISOLATED; CULTURE IN PROGRESS FOR 5 DAYS     Report Status PENDING  Incomplete    RADIOLOGY STUDIES/RESULTS: DG Chest Port 1 View  Result Date: 09/05/2022 CLINICAL DATA:  Shortness of breath EXAM: PORTABLE CHEST - 1 VIEW COMPARISON:  01/27/2022 FINDINGS: Lungs are clear. Heart size and mediastinal contours are within normal limits. Aortic Atherosclerosis (ICD10-170.0). Monitoring device overlies the left hemithorax. Mild blunting of the right lateral costophrenic angle. Visualized bones unremarkable. IMPRESSION: Possible small right effusion. No acute findings. Electronically Signed   By: Lucrezia Europe M.D.   On: 09/05/2022 07:49     LOS: 3 days   Signature  -    Lala Lund M.D on 09/06/2022 at 8:49 AM   -  To page go to [email or web address]

## 2022-09-06 NOTE — Progress Notes (Signed)
Subjective: He feels good.  Swelling has not improved.  Objective: Vital signs in last 24 hours: Temp:  [97.5 F (36.4 C)-98.2 F (36.8 C)] 98.1 F (36.7 C) (03/24 0700) Pulse Rate:  [50-90] 52 (03/24 0700) Resp:  [14-18] 16 (03/24 0700) BP: (123-153)/(53-61) 144/61 (03/24 0700) SpO2:  [92 %-96 %] 94 % (03/24 0700) Wt Readings from Last 1 Encounters:  09/03/22 85.4 kg    Intake/Output from previous day: 03/23 0701 - 03/24 0700 In: -  Out: 225 [Urine:225] Intake/Output this shift: No intake/output data recorded.  General appearance: alert, cooperative, and no distress Neck: left angle of mandible firm edema not improved, drain remains in place with scant drainage  Recent Labs    09/05/22 0240 09/06/22 0332  WBC 24.8* 18.9*  HGB 12.2* 11.9*  HCT 37.3* 38.0*  PLT 65* 62*    Recent Labs    09/05/22 0605 09/06/22 0332  NA 138 139  K 3.9 3.8  CL 100 107  CO2 25 24  GLUCOSE 168* 103*  BUN 32* 33*  CREATININE 1.32* 1.21  CALCIUM 8.8* 8.7*    Medications: I have reviewed the patient's current medications.  Assessment/Plan: Left neck abscess s/p I&D  WBC improving.  No fever.  Culture growing staph aureus, susceptibilities pending.  Discussed his case with hospitalist.  Will add vancomycin waiting on susceptibilities.  Will consider drain removal and then discharge after this is clarified.   LOS: 3 days   Melida Quitter 09/06/2022, 8:48 AM

## 2022-09-06 NOTE — Progress Notes (Signed)
Pharmacy Antibiotic Note  Jonathan [name] is a 89 y.o. male admitted on 09/02/2022 with facial cellulitis.  Pharmacy has been consulted for Vancomycin dosing.  Patient was previously started on Unasyn on 3/21 but has continued to worsen. Per Dr. Candiss Norse, swelling has increased. White blood cell count remains elevated. He is afebrile. Facial abscess culture shows staph aureus, pending sensitivities.   Plan: Start Vancomycin in addition to Unasyn  Start Vancomycin 1000 mg every 24 hours (est AUC 444, Scr 1.21, IBW, Vd 0.72)  Monitor for signs and symptoms of infection  F/u de-escalation and culture sensitivities   Height: 5\' 6"  (167.6 cm) Weight: 85.4 kg (188 lb 4.4 oz) IBW/kg (Calculated) : 63.8  Temp (24hrs), Avg:97.9 F (36.6 C), Min:97.5 F (36.4 C), Max:98.2 F (36.8 C)  Recent Labs  Lab 09/02/22 1926 09/03/22 0447 09/04/22 0537 09/05/22 0240 09/05/22 0605 09/06/22 0332  WBC 10.8* 9.7 11.5* 24.8*  --  18.9*  CREATININE 1.50* 1.31* 1.20  --  1.32* 1.21    Estimated Creatinine Clearance: 44 mL/min (by C-G formula based on SCr of 1.21 mg/dL).    Allergies  Allergen Reactions   Other Other (See Comments)    DUST- Respiratory distress    Antimicrobials this admission: Unasyn 3/21 >> current  Dose adjustments this admission: N/A  Microbiology results: 3/21 Wound UCx: rare staphlococcus aureus, sensi's pending  3/21 MRSA PCR: negative  Thank you for allowing pharmacy to be a part of this patient's care.  Vicenta Dunning, PharmD  PGY1 Pharmacy Resident

## 2022-09-07 ENCOUNTER — Encounter (HOSPITAL_COMMUNITY)
Admission: EM | Disposition: A | Discharge status: Home / Self Care | Payer: Self-pay | Source: Ambulatory Visit | Attending: Internal Medicine

## 2022-09-07 ENCOUNTER — Inpatient Hospital Stay (HOSPITAL_COMMUNITY): Payer: Medicare HMO | Admitting: Anesthesiology

## 2022-09-07 ENCOUNTER — Inpatient Hospital Stay (HOSPITAL_COMMUNITY): Payer: Medicare HMO

## 2022-09-07 ENCOUNTER — Encounter (HOSPITAL_COMMUNITY): Payer: Self-pay | Admitting: Internal Medicine

## 2022-09-07 ENCOUNTER — Other Ambulatory Visit: Payer: Self-pay

## 2022-09-07 DIAGNOSIS — L0201 Cutaneous abscess of face: Secondary | ICD-10-CM | POA: Diagnosis not present

## 2022-09-07 DIAGNOSIS — I10 Essential (primary) hypertension: Secondary | ICD-10-CM

## 2022-09-07 DIAGNOSIS — Z87891 Personal history of nicotine dependence: Secondary | ICD-10-CM | POA: Diagnosis not present

## 2022-09-07 DIAGNOSIS — J449 Chronic obstructive pulmonary disease, unspecified: Secondary | ICD-10-CM | POA: Diagnosis not present

## 2022-09-07 HISTORY — PX: INCISION AND DRAINAGE ABSCESS: SHX5864

## 2022-09-07 LAB — CBC WITH DIFFERENTIAL/PLATELET
Abs Immature Granulocytes: 0 10*3/uL (ref 0.00–0.07)
Basophils Absolute: 0 10*3/uL (ref 0.0–0.1)
Basophils Relative: 0 %
Eosinophils Absolute: 0 10*3/uL (ref 0.0–0.5)
Eosinophils Relative: 0 %
HCT: 36.6 % — ABNORMAL LOW (ref 39.0–52.0)
Hemoglobin: 11.9 g/dL — ABNORMAL LOW (ref 13.0–17.0)
Lymphocytes Relative: 19 %
Lymphs Abs: 2.6 10*3/uL (ref 0.7–4.0)
MCH: 28.7 pg (ref 26.0–34.0)
MCHC: 32.5 g/dL (ref 30.0–36.0)
MCV: 88.2 fL (ref 80.0–100.0)
Monocytes Absolute: 3.8 10*3/uL — ABNORMAL HIGH (ref 0.1–1.0)
Monocytes Relative: 27 %
Neutro Abs: 7.5 10*3/uL (ref 1.7–7.7)
Neutrophils Relative %: 54 %
Platelets: 54 10*3/uL — ABNORMAL LOW (ref 150–400)
RBC: 4.15 MIL/uL — ABNORMAL LOW (ref 4.22–5.81)
RDW: 13.8 % (ref 11.5–15.5)
WBC: 13.9 10*3/uL — ABNORMAL HIGH (ref 4.0–10.5)
nRBC: 0 % (ref 0.0–0.2)
nRBC: 0 /100 WBC

## 2022-09-07 LAB — BASIC METABOLIC PANEL
Anion gap: 12 (ref 5–15)
BUN: 19 mg/dL (ref 8–23)
CO2: 25 mmol/L (ref 22–32)
Calcium: 8.5 mg/dL — ABNORMAL LOW (ref 8.9–10.3)
Chloride: 104 mmol/L (ref 98–111)
Creatinine, Ser: 1.11 mg/dL (ref 0.61–1.24)
GFR, Estimated: >60 mL/min (ref >60)
Glucose, Bld: 90 mg/dL (ref 70–99)
Potassium: 3.6 mmol/L (ref 3.5–5.1)
Sodium: 141 mmol/L (ref 135–145)

## 2022-09-07 LAB — GLUCOSE, CAPILLARY
Glucose-Capillary: 84 mg/dL (ref 70–99)
Glucose-Capillary: 84 mg/dL (ref 70–99)
Glucose-Capillary: 89 mg/dL (ref 70–99)
Glucose-Capillary: 126 mg/dL — ABNORMAL HIGH (ref 70–99)

## 2022-09-07 LAB — SURGICAL PATHOLOGY

## 2022-09-07 LAB — C-REACTIVE PROTEIN: CRP: 0.8 mg/dL (ref <1.0)

## 2022-09-07 LAB — BRAIN NATRIURETIC PEPTIDE: B Natriuretic Peptide: 257.5 pg/mL — ABNORMAL HIGH (ref 0.0–100.0)

## 2022-09-07 LAB — MAGNESIUM: Magnesium: 2 mg/dL (ref 1.7–2.4)

## 2022-09-07 LAB — PROCALCITONIN: Procalcitonin: 0.1 ng/mL

## 2022-09-07 SURGERY — INCISION AND DRAINAGE, ABSCESS
Anesthesia: General | Site: Face | Laterality: Left

## 2022-09-07 MED ORDER — FENTANYL CITRATE (PF) 100 MCG/2ML IJ SOLN
INTRAMUSCULAR | Status: AC
  Administered 2022-09-07: 50 ug via INTRAVENOUS
  Filled 2022-09-07: qty 2

## 2022-09-07 MED ORDER — FENTANYL CITRATE (PF) 100 MCG/2ML IJ SOLN
25.0000 ug | INTRAMUSCULAR | Status: DC | PRN
  Administered 2022-09-07 (×2): 25 ug via INTRAVENOUS

## 2022-09-07 MED ORDER — LIDOCAINE 2% (20 MG/ML) 5 ML SYRINGE
INTRAMUSCULAR | Status: DC | PRN
  Administered 2022-09-07: 80 mg via INTRAVENOUS

## 2022-09-07 MED ORDER — FENTANYL CITRATE (PF) 250 MCG/5ML IJ SOLN
INTRAMUSCULAR | Status: AC
  Filled 2022-09-07: qty 5

## 2022-09-07 MED ORDER — LIDOCAINE-EPINEPHRINE 1 %-1:100000 IJ SOLN
INTRAMUSCULAR | Status: AC
  Filled 2022-09-07: qty 1

## 2022-09-07 MED ORDER — LATANOPROST 0.005 % OP SOLN
1.0000 [drp] | Freq: Every day | OPHTHALMIC | Status: DC
  Administered 2022-09-07 – 2022-09-08 (×2): 1 [drp] via OPHTHALMIC
  Filled 2022-09-07: qty 2.5

## 2022-09-07 MED ORDER — DEXAMETHASONE SODIUM PHOSPHATE 10 MG/ML IJ SOLN
INTRAMUSCULAR | Status: DC | PRN
  Administered 2022-09-07: 10 mg via INTRAVENOUS

## 2022-09-07 MED ORDER — ACETAMINOPHEN 10 MG/ML IV SOLN
1000.0000 mg | Freq: Once | INTRAVENOUS | Status: DC | PRN
  Administered 2022-09-07: 1000 mg via INTRAVENOUS

## 2022-09-07 MED ORDER — CHLORHEXIDINE GLUCONATE 0.12 % MT SOLN
15.0000 mL | OROMUCOSAL | Status: AC
  Filled 2022-09-07: qty 15

## 2022-09-07 MED ORDER — 0.9 % SODIUM CHLORIDE (POUR BTL) OPTIME
TOPICAL | Status: DC | PRN
  Administered 2022-09-07: 1000 mL

## 2022-09-07 MED ORDER — BACITRACIN ZINC 500 UNIT/GM EX OINT
TOPICAL_OINTMENT | CUTANEOUS | Status: AC
  Filled 2022-09-07: qty 28.35

## 2022-09-07 MED ORDER — ACETAMINOPHEN 10 MG/ML IV SOLN
INTRAVENOUS | Status: AC
  Filled 2022-09-07: qty 100

## 2022-09-07 MED ORDER — ONDANSETRON HCL 4 MG/2ML IJ SOLN
INTRAMUSCULAR | Status: DC | PRN
  Administered 2022-09-07: 4 mg via INTRAVENOUS

## 2022-09-07 MED ORDER — CYCLOSPORINE 0.05 % OP EMUL
1.0000 [drp] | Freq: Two times a day (BID) | OPHTHALMIC | Status: DC
  Administered 2022-09-07 – 2022-09-08 (×3): 1 [drp] via OPHTHALMIC
  Filled 2022-09-07 (×5): qty 30

## 2022-09-07 MED ORDER — LACTATED RINGERS IV SOLN: INTRAVENOUS | Status: AC

## 2022-09-07 MED ORDER — CHLORHEXIDINE GLUCONATE 0.12 % MT SOLN
OROMUCOSAL | Status: AC
  Administered 2022-09-07: 15 mL via OROMUCOSAL
  Filled 2022-09-07: qty 15

## 2022-09-07 MED ORDER — PROPOFOL 10 MG/ML IV BOLUS
INTRAVENOUS | Status: DC | PRN
  Administered 2022-09-07: 140 mg via INTRAVENOUS

## 2022-09-07 MED ORDER — LACTATED RINGERS IV SOLN: INTRAVENOUS | Status: DC

## 2022-09-07 MED ORDER — IOPAMIDOL (ISOVUE-370) INJECTION 76%
75.0000 mL | Freq: Once | INTRAVENOUS | Status: AC | PRN
  Administered 2022-09-07: 75 mL via INTRAVENOUS

## 2022-09-07 MED ORDER — FENTANYL CITRATE (PF) 250 MCG/5ML IJ SOLN
INTRAMUSCULAR | Status: DC | PRN
  Administered 2022-09-07 (×4): 25 ug via INTRAVENOUS

## 2022-09-07 MED ORDER — ALLOPURINOL 300 MG PO TABS
300.0000 mg | ORAL_TABLET | Freq: Every evening | ORAL | Status: DC
  Administered 2022-09-07 – 2022-09-08 (×2): 300 mg via ORAL
  Filled 2022-09-07 (×2): qty 1

## 2022-09-07 SURGICAL SUPPLY — 53 items
ATTRACTOMAT 16X20 MAGNETIC DRP (DRAPES) IMPLANT
BAG COUNTER SPONGE SURGICOUNT (BAG) ×1 IMPLANT
BLADE SURG 15 STRL LF DISP TIS (BLADE) IMPLANT
BLADE SURG 15 STRL SS (BLADE) ×1
BNDG GAUZE DERMACEA FLUFF 4 (GAUZE/BANDAGES/DRESSINGS) IMPLANT
CANISTER SUCT 3000ML PPV (MISCELLANEOUS) IMPLANT
CATH ROBINSON RED A/P 16FR (CATHETERS) IMPLANT
CLEANER TIP ELECTROSURG 2X2 (MISCELLANEOUS) ×1 IMPLANT
CNTNR URN SCR LID CUP LEK RST (MISCELLANEOUS) ×1 IMPLANT
CONT SPEC 4OZ STRL OR WHT (MISCELLANEOUS)
COVER SURGICAL LIGHT HANDLE (MISCELLANEOUS) ×1 IMPLANT
DRAIN PENROSE .5X12 LATEX STL (DRAIN) IMPLANT
DRAIN PENROSE 12X.25 LTX STRL (MISCELLANEOUS) ×1 IMPLANT
DRAPE HALF SHEET 40X57 (DRAPES) IMPLANT
DRSG EMULSION OIL 3X3 NADH (GAUZE/BANDAGES/DRESSINGS) ×1 IMPLANT
ELECT COATED BLADE 2.86 ST (ELECTRODE) ×1 IMPLANT
ELECT NDL TIP 2.8 STRL (NEEDLE) IMPLANT
ELECT NEEDLE TIP 2.8 STRL (NEEDLE) IMPLANT
ELECT REM PT RETURN 9FT ADLT (ELECTROSURGICAL) ×1
ELECTRODE REM PT RTRN 9FT ADLT (ELECTROSURGICAL) ×1 IMPLANT
GAUZE 4X4 16PLY ~~LOC~~+RFID DBL (SPONGE) ×1 IMPLANT
GAUZE PACKING IODOFORM 1/4X15 (PACKING) ×1 IMPLANT
GAUZE SPONGE 4X4 12PLY STRL (GAUZE/BANDAGES/DRESSINGS) IMPLANT
GAUZE STRETCH 2X75IN STRL (MISCELLANEOUS) IMPLANT
GLOVE ECLIPSE 7.5 STRL STRAW (GLOVE) ×1 IMPLANT
GOWN STRL REUS W/ TWL LRG LVL3 (GOWN DISPOSABLE) ×2 IMPLANT
GOWN STRL REUS W/TWL LRG LVL3 (GOWN DISPOSABLE) ×2
KIT BASIN OR (CUSTOM PROCEDURE TRAY) ×1 IMPLANT
KIT TURNOVER KIT B (KITS) ×1 IMPLANT
NDL HYPO 30X.5 LL (NEEDLE) ×1 IMPLANT
NDL PRECISIONGLIDE 27X1.5 (NEEDLE) IMPLANT
NEEDLE HYPO 30X.5 LL (NEEDLE) IMPLANT
NEEDLE PRECISIONGLIDE 27X1.5 (NEEDLE) IMPLANT
NS IRRIG 1000ML POUR BTL (IV SOLUTION) ×1 IMPLANT
PAD ARMBOARD 7.5X6 YLW CONV (MISCELLANEOUS) ×2 IMPLANT
PENCIL FOOT CONTROL (ELECTRODE) ×1 IMPLANT
SUT CHROMIC 4 0 P 3 18 (SUTURE) ×1 IMPLANT
SUT ETHILON 4 0 PS 2 18 (SUTURE) ×1 IMPLANT
SUT ETHILON 5 0 PS 2 18 (SUTURE) IMPLANT
SUT NYLON ETHILON 5-0 P-3 1X18 (SUTURE) ×1 IMPLANT
SUT SILK 4 0 REEL (SUTURE) ×1 IMPLANT
SUT VIC AB 3-0 SH 27 (SUTURE) ×1
SUT VIC AB 3-0 SH 27X BRD (SUTURE) IMPLANT
SWAB COLLECTION DEVICE MRSA (MISCELLANEOUS) ×1 IMPLANT
SWAB CULTURE ESWAB REG 1ML (MISCELLANEOUS) ×1 IMPLANT
SYR BULB IRRIG 60ML STRL (SYRINGE) IMPLANT
SYR CONTROL 10ML LL (SYRINGE) ×1 IMPLANT
SYR TOOMEY IRRIG 70ML (MISCELLANEOUS) ×1
SYRINGE TOOMEY IRRIG 70ML (MISCELLANEOUS) IMPLANT
TOWEL GREEN STERILE FF (TOWEL DISPOSABLE) ×1 IMPLANT
TRAY ENT MC OR (CUSTOM PROCEDURE TRAY) ×1 IMPLANT
WATER STERILE IRR 1000ML POUR (IV SOLUTION) ×1 IMPLANT
YANKAUER SUCT BULB TIP NO VENT (SUCTIONS) IMPLANT

## 2022-09-07 NOTE — Transfer of Care (Signed)
Immediate Anesthesia Transfer of Care Note  Patient: Jonathan [name]  Procedure(s) Performed: IRRIGATION AND DEBRIDMENT OF LEFT FACIAL ABSCESS (Left)  Patient Location: PACU  Anesthesia Type:General  Level of Consciousness: drowsy and patient cooperative  Airway & Oxygen Therapy: Patient Spontanous Breathing  Post-op Assessment: Report given to RN and Post -op Vital signs reviewed and stable  Post vital signs: Reviewed and stable  Last Vitals:  Vitals Value Taken Time  BP 140/60 09/07/22 1420  Temp 36.5 C 09/07/22 1420  Pulse 61 09/07/22 1423  Resp 11 09/07/22 1423  SpO2 94 % 09/07/22 1423  Vitals shown include unvalidated device data.  Last Pain:  Vitals:   09/07/22 1157  TempSrc: Oral  PainSc: 0-No pain      Patients Stated Pain Goal: 2 ([id number])  Complications: No notable events documented.

## 2022-09-07 NOTE — Anesthesia Procedure Notes (Signed)
Procedure Name: LMA Insertion Date/Time: 09/07/2022 1:39 PM  Performed by: Thelma Comp, CRNAPre-anesthesia Checklist: Patient identified, Emergency Drugs available, Suction available and Patient being monitored Patient Re-evaluated:Patient Re-evaluated prior to induction Oxygen Delivery Method: Circle System Utilized Preoxygenation: Pre-oxygenation with 100% oxygen Induction Type: IV induction Ventilation: Mask ventilation without difficulty LMA: LMA inserted LMA Size: 4.0 Number of attempts: 1 Placement Confirmation: positive ETCO2 Tube secured with: Tape Dental Injury: Teeth and Oropharynx as per pre-operative assessment  Comments: Placed by Judd Gaudier, SRNA

## 2022-09-07 NOTE — Progress Notes (Signed)
OTOLARYNGOLOGY - HEAD AND NECK SURGERY FACIAL PLASTIC & RECONSTRUCTIVE SURGERY PROGRESS NOTE  ID: 89 y/o M POD#4 s/p I&D left facial abscess  Subjective: Increasing pain/swelling overnight Cultures growing MSSA Afebrile On IV Unasyn and started Vanc yesterday  Objective: Vital signs in last 24 hours: Temp:  [97.5 F (36.4 C)-98.4 F (36.9 C)] 98.4 F (36.9 C) (03/25 0738) Pulse Rate:  [53-65] 53 (03/25 0738) Resp:  [14-20] 18 (03/25 0738) BP: (137-152)/(54-71) 137/56 (03/25 0738) SpO2:  [94 %-98 %] 98 % (03/25 0431)  Physical exam: General: resting comfortably in NAD Eyes: EOMI, PERRL Ears: External ears normal, no otorrhea Nose: Nares clear, no epistaxis OC/OP: MMM, no bleeding Neck: Very firm left facial edema, mild improvement from pre-op exam; less than anticipated. No purulent drainage from penrose.  Neuro: CN II-XII grossly symmetric and intact   @LABLAST2 (wbc:2,hgb:2,hct:2,plt:2) Recent Labs    09/06/22 0332 09/07/22 0659  NA 139 141  K 3.8 3.6  CL 107 104  CO2 24 25  GLUCOSE 103* 90  BUN 33* 19  CREATININE 1.21 1.11  CALCIUM 8.7* 8.5*    Medications: I have reviewed the patient's current medications.  Results reviewed: CT Max-face with contrast 09/07/22 IMPRESSION: Persistent edema along the posterior aspect of the left mandible. Interval postoperative change including packing material in this region. Decreased but persistent 1 only 7 cm abscess, anterior to the tectum anteriorly and abutting the mandible.     Electronically Signed   By: Margaretha Sheffield M.D.   On: 09/07/2022 09:27  Cultures 09/03/22  Staphylococcus aureus      MIC    CIPROFLOXACIN <=0.5 SENSI... Sensitive    CLINDAMYCIN <=0.25 SENS... Sensitive    ERYTHROMYCIN <=0.25 SENS... Sensitive    GENTAMICIN <=0.5 SENSI... Sensitive    Inducible Clindamycin NEGATIVE Sensitive    OXACILLIN 0.5 SENSITIVE Sensitive    RIFAMPIN <=0.5 SENSI... Sensitive    TETRACYCLINE <=1 SENSITIVE  Sensitive    TRIMETH/SULFA <=10 SENSIT... Sensitive    VANCOMYCIN 1 SENSITIVE Sensitive     Assessment/Plan: POD#4 I&D left facial abscess growing MSSA, worsening exam last 24-48 hours with new CT demonstrating 1.7cm facial abscess.   I discussed surgery with the patient for I&D/washout left face. Patient amenable. Verbal informed consent compled.  OCTOR this afternoon. Keep NPO Continue IV Abx; probably do not need IV Vancomycin given sensitivities; consider ID Consultation given aggressive infection.    LOS: 4 days   Jenetta Downer 09/07/2022, 9:41 AM  I have personally spent 20 minutes involved in face-to-face and non-face-to-face activities for this patient on the day of the visit.  Professional time spent includes the following activities, in addition to those noted in the documentation: preparing to see the patient (eg, review of tests), obtaining and/or reviewing separately obtained history, performing a medically appropriate examination and/or evaluation, counseling and educating the patient/family/caregiver, ordering medications, tests or procedures, referring and communicating with other healthcare professionals, documenting clinical information in the electronic or other health record, independently interpreting results and communicating results with the patient/family/caregiver, care coordination.  Electronically signed by:  Jenetta Downer, MD  Staff Physician Facial Plastic & Reconstructive Surgery Otolaryngology - Head and Neck Surgery [place], [place]

## 2022-09-07 NOTE — Progress Notes (Signed)
Mobility Specialist Progress Note   09/07/22 1720  Mobility  Activity Ambulated with assistance in hallway  Level of Assistance Contact guard assist, steadying assist  Assistive Device Other (Comment) (IV pole)  Distance Ambulated (ft) 940 ft  Activity Response Tolerated well  Mobility Referral Yes  $Mobility charge 1 Mobility   Received pt in bed c/o minimal pain (5/10) in neck but agreeable. Steady gait during hallway ambulation. Returned back to room w/o fault.   Holland Falling Mobility Specialist Please contact via SecureChat or  Rehab office at [phone]

## 2022-09-07 NOTE — Care Management Important Message (Signed)
Important Message  Patient Details  Name: Jonathan [name] MRN: [id number] Date of Birth: [birth year]   Medicare Important Message Given:  Yes     [name] [name] 09/07/2022, 3:44 PM

## 2022-09-07 NOTE — Progress Notes (Addendum)
PROGRESS NOTE        PATIENT DETAILS Name: Raine Alwardt Age: 89 y.o. Sex: male Date of Birth: 04-05-1933 Admit Date: 09/02/2022 Admitting Physician Kayleen Memos, DO [id number], Claudina Lick, MD  Brief Summary: Patient is a 89 y.o.  male with history of PAF-no longer on Eliquis since May 2023 due to frequent falls/SDH/thrombocytopenia-who presented to the ED from ENTs office for a left facial abscess.    Significant events: 3/20>> referred to ED from ENTs office for left facial abscess-admit to [facility]  Significant studies: 3/20>> CT maxillofacial: Localized soft tissue swelling near the angle of the mandible consistent with infection/cellulitis-superimposed 2.6 x 1.9 x 1.7 cm abscess.  Significant microbiology data: 3/21>> abscess left face: No growth  Procedures: 3/21>> I&D-left facial abscess  Consults: ENT  Subjective: In bed denies any headache, still has left-sided facial pain and swelling, states today he feels a little bit worse, no problems swallowing food or liquids, no shortness of breath or stridor, no chest pain or focal weakness.  Objective: Vitals: Blood pressure (!) 144/61, pulse (!) 52, temperature 98.1 F (36.7 C), temperature source Oral, resp. rate 16, height 5\' 6"  (1.676 m), weight 85.4 kg, SpO2 94 %.   Exam:  Awake Alert, No new F.N deficits, still has left facial swelling, postop site under bandage with drain in place [place].AT,PERRAL Supple Neck, No JVD,   Symmetrical Chest wall movement, Good air movement bilaterally, CTAB RRR,No Gallops, Rubs or new Murmurs,  +ve B.Sounds, Abd Soft, No tenderness,   No Cyanosis, Clubbing or edema    Assessment/Plan:  Left facial abscess-s/p I&D on 3/21 Some decrease in facial swelling Continue IV Unasyn and vancomycin which was added on 09/06/2022 Follow intra-op cultures prelim cultures growing MSSA ENT following he still has significant swelling on the left side of his face and today he  says he feels little bit worse, will repeat CT scan with contrast, will discuss with ID about his antibiotic regimen, ENT to follow again today, may require reexploration based on CT findings.   AKI Likely hemodynamically mediated Creatinine trending down Avoid nephrotoxic agents Follow electrolytes  PAF Sinus rhythm Continue Amio  No longer on anticoagulation given fall risk/SDH/thrombocytopenia  HLD Statin  HTN BP stable Continue amlodipine Resume other antihypertensives over the next several days  COPD Not in exacerbation Bronchodilators as needed  Hypothyroidism Continue levothyroxine TSH downtrending at 9.8 Defer further changes of levothyroxine dosage to the outpatient setting  Anxiety/depression Appears stable Continue Prozac  Thrombocytopenia At baseline-chronic issue  Peripheral neuropathy Likely due to diabetes Continue Lyrica  Frequent falls History of SDH PT/OT eval  OSA CPAP when able-hopefully after I&D  Obesity: Estimated body mass index is 30.39 kg/m as calculated from the following:   Height as of this encounter: 5\' 6"  (1.676 m).   Weight as of this encounter: 85.4 kg.   Code status:   Code Status: DNR   DVT Prophylaxis: Place and maintain sequential compression device Start: 09/05/22 1016 SCDs Start: 09/03/22 0040 Holding off on pharmacological prophylaxis given thrombocytopenia.   Family Communication:   Called wife [phone] message left 09/07/22 at 10.19 am   Disposition Plan: Status is: Inpatient Remains inpatient appropriate because: severity of illness   Planned Discharge Destination:Home   Diet: Diet Order             Diet heart healthy/carb modified  Fluid consistency: Thin  Diet effective now                     Antimicrobial agents: Anti-infectives (From admission, onward)    Start     Dose/Rate Route Frequency Ordered Stop   09/03/22 0600  Ampicillin-Sulbactam (UNASYN) 3 g in sodium chloride 0.9  % 100 mL IVPB        3 g 200 mL/hr over 30 Minutes Intravenous Every 8 hours 09/03/22 0128     09/02/22 2230  Ampicillin-Sulbactam (UNASYN) 3 g in sodium chloride 0.9 % 100 mL IVPB        3 g 200 mL/hr over 30 Minutes Intravenous  Once 09/02/22 2215 09/03/22 0004        MEDICATIONS: Scheduled Meds:  acidophilus  1 capsule Oral Daily   amiodarone  200 mg Oral Daily   amLODipine  5 mg Oral Daily   ascorbic acid  500 mg Oral Daily   atorvastatin  20 mg Oral Daily   FLUoxetine  40 mg Oral Daily   insulin aspart  0-9 Units Subcutaneous TID WC   levothyroxine  150 mcg Oral Q0600   pantoprazole  40 mg Oral Daily   pregabalin  75 mg Oral BID   senna-docusate  1 tablet Oral QHS   Continuous Infusions:  ampicillin-sulbactam (UNASYN) IV 3 g (09/06/22 0529)   PRN Meds:.acetaminophen, ipratropium-albuterol, melatonin, nitroGLYCERIN, oxyCODONE, polyethylene glycol, prochlorperazine   I have personally reviewed following labs and imaging studies  LABORATORY DATA:  Recent Labs  Lab 09/02/22 1926 09/03/22 0447 09/04/22 0537 09/05/22 0240 09/06/22 0332  WBC 10.8* 9.7 11.5* 24.8* 18.9*  HGB 13.3 12.1* 14.1 12.2* 11.9*  HCT 41.8 37.6* 43.8 37.3* 38.0*  PLT 72* 65* 68* 65* 62*  MCV 91.3 90.0 89.4 88.6 91.1  MCH 29.0 28.9 28.8 29.0 28.5  MCHC 31.8 32.2 32.2 32.7 31.3  RDW 13.9 13.7 13.5 13.6 13.8  LYMPHSABS 2.4  --   --   --  1.9  MONOABS 2.2*  --   --   --  2.6*  EOSABS 0.1  --   --   --  0.0  BASOSABS 0.0  --   --   --  0.0    Recent Labs  Lab 09/01/22 1607 09/02/22 1926 09/03/22 0447 09/04/22 0537 09/05/22 0605 09/06/22 0332  NA  --  138 138 138 138 139  K  --  4.0 3.5 4.3 3.9 3.8  CL  --  105 107 102 100 107  CO2  --  23 24 20* 25 24  ANIONGAP  --  10 7 16* 13 8  GLUCOSE  --  93 93 189* 168* 103*  BUN  --  21 20 22  32* 33*  CREATININE  --  1.50* 1.31* 1.20 1.32* 1.21  AST  --   --  19  --   --   --   ALT  --   --  17  --   --   --   ALKPHOS  --   --  82  --    --   --   BILITOT  --   --  0.8  --   --   --   ALBUMIN  --   --  3.4*  --   --   --   CRP  --   --   --   --  1.4* 0.7  PROCALCITON  --   --   --   --  <  0.10 0.10  TSH 9.80*  --   --   --   --   --   BNP  --   --   --   --   --  298.8*  MG  --   --  2.2  --  2.1 2.3  CALCIUM  --  8.8* 8.4* 8.9 8.8* 8.7*      Recent Labs  Lab 09/01/22 1607 09/02/22 1926 09/03/22 0447 09/04/22 0537 09/05/22 0605 09/06/22 0332  CRP  --   --   --   --  1.4* 0.7  PROCALCITON  --   --   --   --  <0.10 0.10  TSH 9.80*  --   --   --   --   --   BNP  --   --   --   --   --  298.8*  MG  --   --  2.2  --  2.1 2.3  CALCIUM  --  8.8* 8.4* 8.9 8.8* 8.7*   MICROBIOLOGY: Recent Results (from the past 240 hour(s))  MRSA Next Gen by PCR, Nasal     Status: None   Collection Time: 09/03/22  1:15 AM   Specimen: Nasal Mucosa; Nasal Swab  Result Value Ref Range Status   MRSA by PCR Next Gen NOT DETECTED NOT DETECTED Final    Comment: (NOTE) The GeneXpert MRSA Assay (FDA approved for NASAL specimens only), is one component of a comprehensive MRSA colonization surveillance program. It is not intended to diagnose MRSA infection nor to guide or monitor treatment for MRSA infections. Test performance is not FDA approved in patients less than 3 years old. Performed at [facility], [place] [address] [place], [place] [address]   Aerobic/Anaerobic Culture w Gram Stain (surgical/deep wound)     Status: None (Preliminary result)   Collection Time: 09/03/22  3:40 PM   Specimen: Abscess  Result Value Ref Range Status   Specimen Description ABSCESS LEFT FACE  Final   Special Requests NONE  Final   Gram Stain   Final    RARE WBC PRESENT, PREDOMINANTLY PMN NO ORGANISMS SEEN Performed at [facility], [place] [address] [place], [place] [address]    Culture   Final    RARE STAPHYLOCOCCUS AUREUS SUSCEPTIBILITIES TO FOLLOW NO ANAEROBES ISOLATED; CULTURE IN PROGRESS FOR 5 DAYS    Report Status PENDING   Incomplete    RADIOLOGY STUDIES/RESULTS: DG Chest Port 1 View  Result Date: 09/05/2022 CLINICAL DATA:  Shortness of breath EXAM: PORTABLE CHEST - 1 VIEW COMPARISON:  01/27/2022 FINDINGS: Lungs are clear. Heart size and mediastinal contours are within normal limits. Aortic Atherosclerosis (ICD10-170.0). Monitoring device overlies the left hemithorax. Mild blunting of the right lateral costophrenic angle. Visualized bones unremarkable. IMPRESSION: Possible small right effusion. No acute findings. Electronically Signed   By: Lucrezia Europe M.D.   On: 09/05/2022 07:49     LOS: 3 days   Signature  -    Lala Lund M.D on 09/06/2022 at 8:49 AM   -  To page go to [email or web address]

## 2022-09-07 NOTE — Anesthesia Postprocedure Evaluation (Signed)
Anesthesia Post Note  Patient: Jonathan [name]  Procedure(s) Performed: IRRIGATION AND DEBRIDMENT OF LEFT FACIAL ABSCESS (Left: Face)     Patient location during evaluation: PACU Anesthesia Type: General Level of consciousness: sedated Pain management: pain level controlled Vital Signs Assessment: post-procedure vital signs reviewed and stable Respiratory status: spontaneous breathing and respiratory function stable Cardiovascular status: stable Postop Assessment: no apparent nausea or vomiting Anesthetic complications: no  No notable events documented.  Last Vitals:  Vitals:   09/07/22 1450 09/07/22 1500  BP:  (!) 155/59  Pulse: (!) 55 (!) 54  Resp: 12 10  Temp:  36.8 C  SpO2: (!) 89% 98%    Last Pain:  Vitals:   09/07/22 1500  TempSrc:   PainSc: 5                  Maude Gloor DANIEL

## 2022-09-07 NOTE — Anesthesia Preprocedure Evaluation (Addendum)
Anesthesia Evaluation  Patient identified by MRN, date of birth, ID band Patient awake    Reviewed: Allergy & Precautions, NPO status , Patient's Chart, lab work & pertinent test results  Airway Mallampati: III  TM Distance: >3 FB Neck ROM: Limited    Dental  (+) Poor Dentition, Missing   Pulmonary sleep apnea and Oxygen sleep apnea , COPD, former smoker   Pulmonary exam normal        Cardiovascular hypertension, Pt. on medications + dysrhythmias (off eliquis 05/23) Atrial Fibrillation + pacemaker  Rhythm:Regular Rate:Normal  ECHO 2022:  1. Left ventricular ejection fraction, by estimation, is 60 to 65%. The  left ventricle has normal function. The left ventricle has no regional  wall motion abnormalities. Left ventricular diastolic parameters were  normal.   2. Right ventricular systolic function is normal. The right ventricular  size is normal.   3. The mitral valve is normal in structure. Mild to moderate mitral valve  regurgitation.   4. The aortic valve is normal in structure. Aortic valve regurgitation is  not visualized.      Neuro/Psych   Anxiety Depression    TIA   GI/Hepatic Neg liver ROS,GERD  Medicated,,  Endo/Other  diabetes, Type 2Hypothyroidism    Renal/GU negative Renal ROS  negative genitourinary   Musculoskeletal  (+) Arthritis ,  Fibromyalgia -  Abdominal Normal abdominal exam  (+)   Peds  Hematology negative hematology ROS (+)   Anesthesia Other Findings Left facial abscess without airway involvement s/p I&D 3/22 without improvement  Reproductive/Obstetrics                             Anesthesia Physical Anesthesia Plan  ASA: 3  Anesthesia Plan: General   Post-op Pain Management:    Induction: Intravenous  PONV Risk Score and Plan: 2 and Ondansetron and Dexamethasone  Airway Management Planned: Mask and LMA  Additional Equipment: None  Intra-op Plan:    Post-operative Plan: Extubation in OR  Informed Consent: I have reviewed the patients History and Physical, chart, labs and discussed the procedure including the risks, benefits and alternatives for the proposed anesthesia with the patient or authorized representative who has indicated his/her understanding and acceptance.     Dental advisory given  Plan Discussed with: CRNA  Anesthesia Plan Comments:        Anesthesia Quick Evaluation

## 2022-09-07 NOTE — Op Note (Signed)
OPERATIVE NOTE  Jonathan [name] Date/Time of Admission: 09/02/2022  6:57 PM  CSN: [id number] Attending Provider: Thurnell Lose, MD Room/Bed: MCPO/NONE DOB: 05-03-1933 Age: 89 y.o.   Pre-Op Diagnosis: Left Facial Abscess  Post-Op Diagnosis: Left Facial Abscess  Procedure: Procedure(s): IRRIGATION AND DEBRIDMENT OF LEFT FACIAL ABSCESS  Anesthesia: General  Surgeon(s): Pamala Hurry, MD  Staff: Circulator: Darylene Price, RN Scrub Person: Rolan Bucco Circulator Assistant: Dena Billet R  Implants: * No implants in log *  Specimens: ID Type Source Tests Collected by Time Destination  A : Left facial abscess Tissue PATH Other AEROBIC/ANAEROBIC CULTURE W GRAM STAIN (SURGICAL/DEEP WOUND) Jenetta Downer, MD [id number]     Complications: none  EBL: 10 ML  IVF: Per anesthesia ML  Condition: stable  Operative Findings:  Wide blunt dissection in the radiographic area of concern. No frank pus evacuated  Description of Operation: The patient was identified in the preoperative area and consent confirmed in the chart.  He was brought to the operating room by the anesthetist and a preoperative huddle was performed confirming the patient's identity and procedure to be performed.  Once all were in agreement we proceeded with surgery.  General anesthesia was induced and the patient was intubated with a laryngeal mask airway.  This was secured. The patient's head was turned to the right exposing the left neck.  The patient was prepped and draped in standard fashion for procedure of this kind.  Final preoperative pause was performed and we proceeded with surgery.   The prior 3cm transverse skin incision sutures were released and penrose drain removed. Digital blunt dissection as well as blunt dissection with a tonsil dissector was used to dissect above and posterior the submandibular space into the area of the prior I&D space and radiographic area of  concern. After extension blunt dissection there was no frank abscess cavity encountered. The wound was copiously irrigated with sterile saline. A 1/2" penrose drain was placed and the wound was closed with 3-0 vicryl platysmal layer suture and 5-0 nylon.    A dressings consisting of gauze fluffs and bandnet were applied.  The patient was turned back to anesthesia, extubated and brought to the recovery room in stable condition.   Pamala Hurry, MD [facility]  09/07/2022

## 2022-09-08 ENCOUNTER — Encounter (HOSPITAL_COMMUNITY): Payer: Self-pay | Admitting: Otolaryngology

## 2022-09-08 DIAGNOSIS — L0201 Cutaneous abscess of face: Secondary | ICD-10-CM | POA: Diagnosis not present

## 2022-09-08 LAB — CBC WITH DIFFERENTIAL/PLATELET
Abs Immature Granulocytes: 0 10*3/uL (ref 0.00–0.07)
Band Neutrophils: 1 %
Basophils Absolute: 0 10*3/uL (ref 0.0–0.1)
Basophils Relative: 0 %
Eosinophils Absolute: 0 10*3/uL (ref 0.0–0.5)
Eosinophils Relative: 0 %
HCT: 36.9 % — ABNORMAL LOW (ref 39.0–52.0)
Hemoglobin: 12.2 g/dL — ABNORMAL LOW (ref 13.0–17.0)
Lymphocytes Relative: 7 %
Lymphs Abs: 0.7 10*3/uL (ref 0.7–4.0)
MCH: 29.4 pg (ref 26.0–34.0)
MCHC: 33.1 g/dL (ref 30.0–36.0)
MCV: 88.9 fL (ref 80.0–100.0)
Monocytes Absolute: 0.5 10*3/uL (ref 0.1–1.0)
Monocytes Relative: 5 %
Neutro Abs: 9.4 10*3/uL — ABNORMAL HIGH (ref 1.7–7.7)
Neutrophils Relative %: 87 %
Platelets: 48 10*3/uL — ABNORMAL LOW (ref 150–400)
RBC: 4.15 MIL/uL — ABNORMAL LOW (ref 4.22–5.81)
RDW: 13.5 % (ref 11.5–15.5)
WBC: 10.7 10*3/uL — ABNORMAL HIGH (ref 4.0–10.5)
nRBC: 0 % (ref 0.0–0.2)
nRBC: 0 /100 WBC

## 2022-09-08 LAB — C-REACTIVE PROTEIN: CRP: 2.6 mg/dL — ABNORMAL HIGH (ref <1.0)

## 2022-09-08 LAB — BASIC METABOLIC PANEL
Anion gap: 11 (ref 5–15)
BUN: 21 mg/dL (ref 8–23)
CO2: 22 mmol/L (ref 22–32)
Calcium: 8.4 mg/dL — ABNORMAL LOW (ref 8.9–10.3)
Chloride: 103 mmol/L (ref 98–111)
Creatinine, Ser: 1.17 mg/dL (ref 0.61–1.24)
GFR, Estimated: >60 mL/min (ref >60)
Glucose, Bld: 215 mg/dL — ABNORMAL HIGH (ref 70–99)
Potassium: 4 mmol/L (ref 3.5–5.1)
Sodium: 136 mmol/L (ref 135–145)

## 2022-09-08 LAB — AEROBIC/ANAEROBIC CULTURE W GRAM STAIN (SURGICAL/DEEP WOUND)

## 2022-09-08 LAB — MAGNESIUM: Magnesium: 2.1 mg/dL (ref 1.7–2.4)

## 2022-09-08 LAB — GLUCOSE, CAPILLARY
Glucose-Capillary: 146 mg/dL — ABNORMAL HIGH (ref 70–99)
Glucose-Capillary: 166 mg/dL — ABNORMAL HIGH (ref 70–99)
Glucose-Capillary: 205 mg/dL — ABNORMAL HIGH (ref 70–99)
Glucose-Capillary: 210 mg/dL — ABNORMAL HIGH (ref 70–99)

## 2022-09-08 LAB — PROCALCITONIN: Procalcitonin: <0.1 ng/mL

## 2022-09-08 LAB — BRAIN NATRIURETIC PEPTIDE: B Natriuretic Peptide: 296.1 pg/mL — ABNORMAL HIGH (ref 0.0–100.0)

## 2022-09-08 LAB — PATHOLOGIST SMEAR REVIEW

## 2022-09-08 MED ORDER — CEFAZOLIN SODIUM-DEXTROSE 2-4 GM/100ML-% IV SOLN
2.0000 g | Freq: Three times a day (TID) | INTRAVENOUS | Status: DC
  Administered 2022-09-08 – 2022-09-09 (×4): 2 g via INTRAVENOUS
  Filled 2022-09-08 (×4): qty 100

## 2022-09-08 MED ORDER — DEXAMETHASONE SODIUM PHOSPHATE 10 MG/ML IJ SOLN
6.0000 mg | Freq: Once | INTRAMUSCULAR | Status: AC
  Administered 2022-09-08: 6 mg via INTRAVENOUS
  Filled 2022-09-08: qty 1

## 2022-09-08 NOTE — Progress Notes (Signed)
OTOLARYNGOLOGY - HEAD AND NECK SURGERY FACIAL PLASTIC & RECONSTRUCTIVE SURGERY PROGRESS NOTE  ID: 89 y/o M POD#5 s/p I&D left facial abscess, POD#1 s/p repeat I&D left facial abscess (03/2  Subjective: Increasing pain/swelling overnight Cultures growing MSSA Afebrile On IV Unasyn and started Vanc yesterday  Objective: Vital signs in last 24 hours: Temp:  [97.2 F (36.2 C)-98.8 F (37.1 C)] 97.6 F (36.4 C) (03/26 1558) Pulse Rate:  [51-53] 51 (03/26 0444) BP: (116-146)/(54-106) 146/58 (03/26 1558) SpO2:  [96 %-97 %] 97 % (03/26 0444)  Physical exam: General: resting comfortably in NAD Eyes: EOMI, PERRL Ears: External ears normal, no otorrhea Nose: Nares clear, no epistaxis OC/OP: MMM, no bleeding Neck: Very firm left facial edema, mild improvement from pre-op exam; less than anticipated. No purulent drainage from penrose.  Neuro: CN II-XII grossly symmetric and intact   @LABLAST2 (wbc:2,hgb:2,hct:2,plt:2) Recent Labs    09/07/22 0659 09/08/22 0436  NA 141 136  K 3.6 4.0  CL 104 103  CO2 25 22  GLUCOSE 90 215*  BUN 19 21  CREATININE 1.11 1.17  CALCIUM 8.5* 8.4*     Medications: I have reviewed the patient's current medications.  Results reviewed: CT Max-face with contrast 09/07/22 IMPRESSION: Persistent edema along the posterior aspect of the left mandible. Interval postoperative change including packing material in this region. Decreased but persistent 1 only 7 cm abscess, anterior to the tectum anteriorly and abutting the mandible.     Electronically Signed   By: Margaretha Sheffield M.D.   On: 09/07/2022 09:27  Cultures 09/03/22  Staphylococcus aureus      MIC    CIPROFLOXACIN <=0.5 SENSI... Sensitive    CLINDAMYCIN <=0.25 SENS... Sensitive    ERYTHROMYCIN <=0.25 SENS... Sensitive    GENTAMICIN <=0.5 SENSI... Sensitive    Inducible Clindamycin NEGATIVE Sensitive    OXACILLIN 0.5 SENSITIVE Sensitive    RIFAMPIN <=0.5 SENSI... Sensitive     TETRACYCLINE <=1 SENSITIVE Sensitive    TRIMETH/SULFA <=10 SENSIT... Sensitive    VANCOMYCIN 1 SENSITIVE Sensitive     Assessment/Plan: POD#5/1 I&D facial abscess doing well.   Anticipate penrose drain removal tomorrow and discharge Continue abx and steroids   LOS: 5 days   Jenetta Downer 09/08/2022, 4:36 PM  I have personally spent 20 minutes involved in face-to-face and non-face-to-face activities for this patient on the day of the visit.  Professional time spent includes the following activities, in addition to those noted in the documentation: preparing to see the patient (eg, review of tests), obtaining and/or reviewing separately obtained history, performing a medically appropriate examination and/or evaluation, counseling and educating the patient/family/caregiver, ordering medications, tests or procedures, referring and communicating with other healthcare professionals, documenting clinical information in the electronic or other health record, independently interpreting results and communicating results with the patient/family/caregiver, care coordination.  Electronically signed by:  Jenetta Downer, MD  Staff Physician Facial Plastic & Reconstructive Surgery Otolaryngology - Head and Neck Surgery [place], [place]

## 2022-09-08 NOTE — Care Management (Addendum)
  Transition of Care [facility]) Screening Note   Patient Details  Name: Jonathan [name] Date of Birth: 09/30/1932   Transition of [facility]) CM/SW Contact:    Levonne Lapping, RN Phone Number: 09/08/2022, 9:55 AM    Transition of Care Department [facility]) has reviewed patient .  OPPT is being recommended. CM will make referral We will continue to monitor patient advancement through interdisciplinary progression rounds. If new patient transition needs arise, please place a TOC consult.

## 2022-09-08 NOTE — Progress Notes (Addendum)
PROGRESS NOTE        PATIENT DETAILS Name: Jonathan [name] Age: 89 y.o. Sex: male Date of Birth: 23-Nov-1932 Admit Date: 09/02/2022 Admitting Physician Kayleen Memos, DO WY:[id number], Claudina Lick, MD  Brief Summary: Patient is a 89 y.o.  male with history of PAF-no longer on Eliquis since May 2023 due to frequent falls/SDH/thrombocytopenia-who presented to the ED from ENTs office for a left facial abscess.    Significant events: 3/20>> referred to ED from ENTs office for left facial abscess-admit to [facility]  Significant studies: 3/20>> CT maxillofacial: Localized soft tissue swelling near the angle of the mandible consistent with infection/cellulitis-superimposed 2.6 x 1.9 x 1.7 cm abscess.  Significant microbiology data: 3/21>> abscess left face: No growth  Procedures: 3/21>> I&D-left facial abscess 3/25>> I&D-left facial abscess  Consults: ENT  Subjective: Patient in bed, appears comfortable, denies any headache, no fever, no chest pain or pressure, no shortness of breath , no abdominal pain. No new focal weakness.   Objective: Vitals: Blood pressure (!) 145/60, pulse (!) 51, temperature (!) 97.2 F (36.2 C), temperature source Oral, resp. rate 16, height 5\' 6"  (1.676 m), weight 85.4 kg, SpO2 97 %.   Exam:  Awake Alert, No new F.N deficits, improved left facial swelling, postop site under bandage with drain in place [place].AT,PERRAL Supple Neck, No JVD,   Symmetrical Chest wall movement, Good air movement bilaterally, CTAB RRR,No Gallops, Rubs or new Murmurs,  +ve B.Sounds, Abd Soft, No tenderness,   No Cyanosis, Clubbing or edema    Assessment/Plan:  Left facial abscess-s/p I&D on 3/21 & again on 3/25 Still swelling has improved, case discussed with ID Dr. Graylon Good patient is growing MSSA in his wound cultures will be switched to IV cefazolin and eventually discharged on oral Keflex for 2 weeks.  Dose of IV Decadron again on 09/08/2022 per ENT  physician.  AKI Secondary to infection resolved after hydration with IV fluids.  PAF Sinus rhythm Continue Amio  No longer on anticoagulation given fall risk/SDH/thrombocytopenia  HLD Statin  HTN BP stable Continue amlodipine Resume other antihypertensives over the next several days  COPD Not in exacerbation Bronchodilators as needed  Hypothyroidism Continue levothyroxine TSH downtrending at 9.8 Defer further changes of levothyroxine dosage to the outpatient setting  Anxiety/depression Appears stable Continue Prozac  Thrombocytopenia At baseline-chronic issue  Peripheral neuropathy Likely due to diabetes Continue Lyrica  Frequent falls History of SDH PT/OT eval  OSA CPAP when able-hopefully after I&D  Obesity: Estimated body mass index is 30.39 kg/m as calculated from the following:   Height as of this encounter: 5\' 6"  (1.676 m).   Weight as of this encounter: 85.4 kg.   Code status:   Code Status: DNR   DVT Prophylaxis: Place and maintain sequential compression device Start: 09/05/22 1016 SCDs Start: 09/03/22 0040 Holding off on pharmacological prophylaxis given thrombocytopenia.   Family Communication:   Called wife [phone] message left 09/07/22 at 10.19 am, updated daughter [place] on 09/08/2022.   Disposition Plan: Status is: Inpatient Remains inpatient appropriate because: severity of illness   Planned Discharge Destination:Home   Diet: Diet Order             DIET SOFT Room service appropriate? Yes; Fluid consistency: Thin  Diet effective now  MEDICATIONS: Scheduled Meds:  acidophilus  1 capsule Oral Daily   allopurinol  300 mg Oral QPM   amiodarone  200 mg Oral Daily   amLODipine  5 mg Oral Daily   ascorbic acid  500 mg Oral Daily   atorvastatin  20 mg Oral Daily   cycloSPORINE  1 drop Both Eyes BID   dexamethasone (DECADRON) injection  6 mg Intravenous Once   FLUoxetine  40 mg Oral Daily    insulin aspart  0-9 Units Subcutaneous TID WC   latanoprost  1 drop Both Eyes QHS   levothyroxine  150 mcg Oral Q0600   pantoprazole  40 mg Oral Daily   pregabalin  75 mg Oral BID   senna-docusate  1 tablet Oral QHS   Continuous Infusions:  ampicillin-sulbactam (UNASYN) IV 3 g (09/08/22 0516)   lactated ringers 75 mL/hr at 09/07/22 2110   lactated ringers 10 mL/hr at 09/07/22 1200   vancomycin 1,000 mg (09/07/22 0848)   PRN Meds:.acetaminophen, ipratropium-albuterol, melatonin, nitroGLYCERIN, oxyCODONE, polyethylene glycol, prochlorperazine   I have personally reviewed following labs and imaging studies  LABORATORY DATA:  Recent Labs  Lab 09/02/22 1926 09/03/22 0447 09/04/22 0537 09/05/22 0240 09/06/22 0332 09/07/22 0659 09/08/22 0436  WBC 10.8*   < > 11.5* 24.8* 18.9* 13.9* 10.7*  HGB 13.3   < > 14.1 12.2* 11.9* 11.9* 12.2*  HCT 41.8   < > 43.8 37.3* 38.0* 36.6* 36.9*  PLT 72*   < > 68* 65* 62* 54* 48*  MCV 91.3   < > 89.4 88.6 91.1 88.2 88.9  MCH 29.0   < > 28.8 29.0 28.5 28.7 29.4  MCHC 31.8   < > 32.2 32.7 31.3 32.5 33.1  RDW 13.9   < > 13.5 13.6 13.8 13.8 13.5  LYMPHSABS 2.4  --   --   --  1.9 2.6 0.7  MONOABS 2.2*  --   --   --  2.6* 3.8* 0.5  EOSABS 0.1  --   --   --  0.0 0.0 0.0  BASOSABS 0.0  --   --   --  0.0 0.0 0.0   < > = values in this interval not displayed.    Recent Labs  Lab 09/01/22 1607 09/02/22 1926 09/03/22 0447 09/04/22 0537 09/05/22 0605 09/06/22 0332 09/07/22 0659 09/08/22 0436  NA  --    < > 138 138 138 139 141 136  K  --    < > 3.5 4.3 3.9 3.8 3.6 4.0  CL  --    < > 107 102 100 107 104 103  CO2  --    < > 24 20* 25 24 25 22   ANIONGAP  --    < > 7 16* 13 8 12 11   GLUCOSE  --    < > 93 189* 168* 103* 90 215*  BUN  --    < > 20 22 32* 33* 19 21  CREATININE  --    < > 1.31* 1.20 1.32* 1.21 1.11 1.17  AST  --   --  19  --   --   --   --   --   ALT  --   --  17  --   --   --   --   --   ALKPHOS  --   --  82  --   --   --   --    --   BILITOT  --   --  0.8  --   --   --   --   --   ALBUMIN  --   --  3.4*  --   --   --   --   --   CRP  --   --   --   --  1.4* 0.7 0.8 2.6*  PROCALCITON  --   --   --   --  <0.10 0.10 0.10 <0.10  TSH 9.80*  --   --   --   --   --   --   --   BNP  --   --   --   --   --  298.8* 257.5* 296.1*  MG  --   --  2.2  --  2.1 2.3 2.0 2.1  CALCIUM  --    < > 8.4* 8.9 8.8* 8.7* 8.5* 8.4*   < > = values in this interval not displayed.     RADIOLOGY STUDIES/RESULTS: CT MAXILLOFACIAL W CONTRAST  Result Date: 09/07/2022 CLINICAL DATA:  Sublingual/submandibular abscess EXAM: CT MAXILLOFACIAL WITH CONTRAST TECHNIQUE: Multidetector CT imaging of the maxillofacial structures was performed with intravenous contrast. Multiplanar CT image reconstructions were also generated. RADIATION DOSE REDUCTION: This exam was performed according to the departmental dose-optimization program which includes automated exposure control, adjustment of the mA and/or kV according to patient size and/or use of iterative reconstruction technique. CONTRAST:  77mL ISOVUE-370 IOPAMIDOL (ISOVUE-370) INJECTION 76% COMPARISON:  Maxillofacial CT March 2024. FINDINGS: Osseous: No fracture or mandibular dislocation. No destructive process. Orbits: Negative. No traumatic or inflammatory finding. Sinuses: Largely clear sinuses. Soft tissues: Persistent edema along the posterior aspect of the left mandible. Interval postoperative change including packing material in this region. Decreased but persistent fluid collection anterior to the packing material, measuring 1.7 x 1.2 x 1.3 cm which abuts the adjacent mandible (for example see series 3, image 28 and series 7, image 63). Surrounding edema. Limited intracranial: No significant or unexpected finding. IMPRESSION: Persistent edema along the posterior aspect of the left mandible. Interval postoperative change including packing material in this region. Decreased but persistent 1 only 7 cm abscess,  anterior to the tectum anteriorly and abutting the mandible. Electronically Signed   By: Margaretha Sheffield M.D.   On: 09/07/2022 09:27     LOS: 5 days   Signature  -    Lala Lund M.D on 09/08/2022 at 7:48 AM   -  To page go to [email or web address]

## 2022-09-08 NOTE — Discharge Instructions (Addendum)
Keep your postop facial site clean and dry at all times,  Wound care: Clean wound daily with wash cloth/soap and water. Apply a layer of aqupahor, gauze and tape to keep covered.  F/u with ENT in 7-10 days for suture removal.  Follow with Primary MD Binnie Rail, MD in 7 days   Get CBC, CMP, TSH -  checked next visit with your primary MD    Activity: As tolerated with Full fall precautions use walker/cane & assistance as needed  Disposition Home    Diet: Heart Healthy   Special Instructions: If you have smoked or chewed Tobacco  in the last 2 yrs please stop smoking, stop any regular Alcohol  and or any Recreational drug use.  On your next visit with your primary care physician please Get Medicines reviewed and adjusted.  Please request your Prim.MD to go over all Hospital Tests and Procedure/Radiological results at the follow up, please get all Hospital records sent to your Prim MD by signing hospital release before you go home.  If you experience worsening of your admission symptoms, develop shortness of breath, life threatening emergency, suicidal or homicidal thoughts you must seek medical attention immediately by calling 911 or calling your MD immediately  if symptoms less severe.  You Must read complete instructions/literature along with all the possible adverse reactions/side effects for all the Medicines you take and that have been prescribed to you. Take any new Medicines after you have completely understood and accpet all the possible adverse reactions/side effects.

## 2022-09-09 ENCOUNTER — Other Ambulatory Visit (HOSPITAL_COMMUNITY): Payer: Self-pay

## 2022-09-09 DIAGNOSIS — L0201 Cutaneous abscess of face: Secondary | ICD-10-CM | POA: Diagnosis not present

## 2022-09-09 LAB — MAGNESIUM: Magnesium: 1.9 mg/dL (ref 1.7–2.4)

## 2022-09-09 LAB — CBC WITH DIFFERENTIAL/PLATELET
Abs Immature Granulocytes: 1 10*3/uL — ABNORMAL HIGH (ref 0.00–0.07)
Basophils Absolute: 0 10*3/uL (ref 0.0–0.1)
Basophils Relative: 0 %
Eosinophils Absolute: 0 10*3/uL (ref 0.0–0.5)
Eosinophils Relative: 0 %
HCT: 35 % — ABNORMAL LOW (ref 39.0–52.0)
Hemoglobin: 11.6 g/dL — ABNORMAL LOW (ref 13.0–17.0)
Lymphocytes Relative: 15 %
Lymphs Abs: 2.9 10*3/uL (ref 0.7–4.0)
MCH: 29.3 pg (ref 26.0–34.0)
MCHC: 33.1 g/dL (ref 30.0–36.0)
MCV: 88.4 fL (ref 80.0–100.0)
Metamyelocytes Relative: 3 %
Monocytes Absolute: 3.8 10*3/uL — ABNORMAL HIGH (ref 0.1–1.0)
Monocytes Relative: 20 %
Myelocytes: 2 %
Neutro Abs: 11.4 10*3/uL — ABNORMAL HIGH (ref 1.7–7.7)
Neutrophils Relative %: 60 %
Platelets: 53 10*3/uL — ABNORMAL LOW (ref 150–400)
RBC: 3.96 MIL/uL — ABNORMAL LOW (ref 4.22–5.81)
RDW: 13.5 % (ref 11.5–15.5)
WBC: 19 10*3/uL — ABNORMAL HIGH (ref 4.0–10.5)
nRBC: 0 % (ref 0.0–0.2)

## 2022-09-09 LAB — BASIC METABOLIC PANEL
Anion gap: 9 (ref 5–15)
BUN: 21 mg/dL (ref 8–23)
CO2: 27 mmol/L (ref 22–32)
Calcium: 8.5 mg/dL — ABNORMAL LOW (ref 8.9–10.3)
Chloride: 103 mmol/L (ref 98–111)
Creatinine, Ser: 1.14 mg/dL (ref 0.61–1.24)
GFR, Estimated: >60 mL/min (ref >60)
Glucose, Bld: 171 mg/dL — ABNORMAL HIGH (ref 70–99)
Potassium: 4 mmol/L (ref 3.5–5.1)
Sodium: 139 mmol/L (ref 135–145)

## 2022-09-09 LAB — C-REACTIVE PROTEIN: CRP: 1.3 mg/dL — ABNORMAL HIGH (ref <1.0)

## 2022-09-09 LAB — GLUCOSE, CAPILLARY: Glucose-Capillary: 116 mg/dL — ABNORMAL HIGH (ref 70–99)

## 2022-09-09 LAB — PROCALCITONIN: Procalcitonin: <0.1 ng/mL

## 2022-09-09 LAB — BRAIN NATRIURETIC PEPTIDE: B Natriuretic Peptide: 365.2 pg/mL — ABNORMAL HIGH (ref 0.0–100.0)

## 2022-09-09 MED ORDER — CEPHALEXIN 500 MG PO CAPS
500.0000 mg | ORAL_CAPSULE | Freq: Three times a day (TID) | ORAL | 0 refills | Status: AC
  Filled 2022-09-09: qty 36, 12d supply, fill #0

## 2022-09-09 MED ORDER — ADHESIVE PAPER TAPE
MEDICATED_TAPE | 0 refills | Status: DC
  Filled 2022-09-09: qty 1, fill #0

## 2022-09-09 MED ORDER — AMLODIPINE BESYLATE 10 MG PO TABS: 10.0000 mg | ORAL_TABLET | Freq: Every day | ORAL | Status: DC

## 2022-09-09 MED ORDER — HYDRALAZINE HCL 20 MG/ML IJ SOLN: 10.0000 mg | Freq: Four times a day (QID) | INTRAMUSCULAR | Status: DC | PRN

## 2022-09-09 NOTE — Plan of Care (Signed)

## 2022-09-09 NOTE — Discharge Summary (Signed)
Jonathan [name] [id number] DOB: 06-Mar-1933 DOA: 09/02/2022  PCP: Binnie Rail, MD  Admit date: 09/02/2022  Discharge date: 09/09/2022  Admitted From: Home   Disposition:  Home   Recommendations for Outpatient Follow-up:   Follow up with PCP in 1-2 weeks  PCP Please obtain BMP/CBC, 2 view CXR in 1week,  (see Discharge instructions)   PCP Please follow up on the following pending results: Monitor submandibular postop site closely.  Check CBC, BMP and blood pressure in 5 to 7 days.  Kindly follow final culture and sensitivity results.   Home Health: None   Equipment/Devices: None  Consultations: ENT Discharge Condition: Stable    CODE STATUS: Full    Diet Recommendation: Heart Healthy     Chief Complaint  Patient presents with   Mass     Brief history of present illness from the day of admission and additional interim summary     89 y.o.  male with history of PAF-no longer on Eliquis since May 2023 due to frequent falls/SDH/thrombocytopenia-who presented to the ED from ENTs office for a left facial abscess.     Significant events: 3/20>> referred to ED from ENTs office for left facial abscess-admit to [facility]   Significant studies: 3/20>> CT maxillofacial: Localized soft tissue swelling near the angle of the mandible consistent with infection/cellulitis-superimposed 2.6 x 1.9 x 1.7 cm abscess.   Significant microbiology data: 3/21>> abscess left face: No growth   Procedures: 3/21>> I&D-left facial abscess 3/25>> I&D-left facial abscess                                                                 Hospital Course    Left facial abscess-s/p I&D on 3/21 & again on 3/25 -after the second incision and drainage patient is much improved, culture sensitivity data from the first surgery noted to be  growing MSSA, case was discussed with Dr. Karolee Ohs, ID, was given IV antibiotics here thereafter will be switched to 2 more weeks of oral Keflex.  Case discussed with ENT cleared for discharge.  Patient much improved post discharge wound care instructions and supplies provided, will follow-up with PCP and ENT within 1 week of discharge.      AKI Secondary to infection resolved after hydration with IV fluids.   PAF Sinus rhythm Continue Amio  No longer on anticoagulation given fall risk/SDH/thrombocytopenia   HLD Statin   HTN BP stable Continue amlodipine Resume other antihypertensives over the next several days   COPD Not in exacerbation Bronchodilators as needed   Hypothyroidism Continue levothyroxine TSH downtrending at 9.8 Defer further changes of levothyroxine dosage to the outpatient setting   Anxiety/depression Appears stable Continue Prozac   Thrombocytopenia At baseline-chronic issue   Peripheral neuropathy Likely due to diabetes Continue Lyrica   Frequent falls History of  SDH PT/OT eval   OSA Continue home regimen   Obesity: Estimated body mass index is 30.39 kg/m as calculated from the following:   Height as of this encounter: 5\' 6"  (1.676 m).   Weight as of this encounter: 85.4 kg.     Discharge diagnosis     Principal Problem:   Facial abscess    Discharge instructions    Discharge Instructions     Diet - low sodium heart healthy   Complete by: As directed    Discharge instructions   Complete by: As directed    Keep your postop facial site clean and dry at all times,  Wound care: Clean wound daily with wash cloth/soap and water. Apply a layer of aqupahor, gauze and tape to keep covered.  F/u with ENT in 7-10 days for suture removal.  Follow with Primary MD Binnie Rail, MD in 7 days   Get CBC, CMP, TSH -  checked next visit with your primary MD    Activity: As tolerated with Full fall precautions use walker/cane &  assistance as needed  Disposition Home    Diet: Heart Healthy   Special Instructions: If you have smoked or chewed Tobacco  in the last 2 yrs please stop smoking, stop any regular Alcohol  and or any Recreational drug use.  On your next visit with your primary care physician please Get Medicines reviewed and adjusted.  Please request your Prim.MD to go over all Hospital Tests and Procedure/Radiological results at the follow up, please get all Hospital records sent to your Prim MD by signing hospital release before you go home.  If you experience worsening of your admission symptoms, develop shortness of breath, life threatening emergency, suicidal or homicidal thoughts you must seek medical attention immediately by calling 911 or calling your MD immediately  if symptoms less severe.  You Must read complete instructions/literature along with all the possible adverse reactions/side effects for all the Medicines you take and that have been prescribed to you. Take any new Medicines after you have completely understood and accpet all the possible adverse reactions/side effects.   Increase activity slowly   Complete by: As directed        Discharge Medications   Allergies as of 09/09/2022       Reactions   Other Other (See Comments)   DUST- Respiratory distress        Medication List     TAKE these medications    acetaminophen 650 MG CR tablet Commonly known as: TYLENOL Take 650 mg by mouth in the morning and at bedtime.   acyclovir ointment 5 % Commonly known as: ZOVIRAX Apply 1 application  topically daily as needed (cold sores).   Adhesive Paper Tape Please supply 1 week dressing supply as needed for - Clean wound daily with wash cloth/soap and water. Apply a layer of aqupahor, gauze and tape   ALLERGY RELIEF NA Place 2 sprays into the nose daily as needed (allergies).   allopurinol 300 MG tablet Commonly known as: ZYLOPRIM Take 1 tablet (300 mg total) by mouth every  evening. For gout   amiodarone 200 MG tablet Commonly known as: PACERONE Take 200 mg by mouth daily.   amLODipine 5 MG tablet Commonly known as: NORVASC TAKE 1 TABLET (5 MG TOTAL) BY MOUTH DAILY.   ascorbic acid 500 MG tablet Commonly known as: VITAMIN C Take 500 mg by mouth daily.   atorvastatin 20 MG tablet Commonly known as: LIPITOR TAKE 1 TABLET  BY MOUTH EVERY DAY FOR CHOLESTEROL   CALCIUM + D PO Take 1 tablet by mouth daily.   carboxymethylcellulose 0.5 % Soln Commonly known as: REFRESH PLUS Place 1 drop into both eyes 3 (three) times daily as needed (dry eyes).   CBD KINGS EX Apply 1 Application topically as needed (pain).   cephALEXin 500 MG capsule Commonly known as: KEFLEX Take 1 capsule (500 mg total) by mouth 3 (three) times daily for 12 days.   Farxiga 10 MG Tabs tablet Generic drug: dapagliflozin propanediol TAKE 1 TABLET (10 MG TOTAL) BY MOUTH DAILY BEFORE BREAKFAST. FOR DIABETES   FLUoxetine 40 MG capsule Commonly known as: PROZAC Take 1 capsule (40 mg total) by mouth daily.   Gemtesa 75 MG Tabs Generic drug: Vibegron Take 75 mg by mouth daily.   hydrochlorothiazide 12.5 MG tablet Commonly known as: HYDRODIURIL TAKE 1 TABLET BY MOUTH EVERY DAY   Lancets Misc Use to check blood sugars daily. E11.9   latanoprost 0.005 % ophthalmic solution Commonly known as: XALATAN Place 1 drop into both eyes at bedtime.   levothyroxine 150 MCG tablet Commonly known as: SYNTHROID Take one tab daily 6 days a week, take 2 tabs once a week What changed:  how much to take how to take this when to take this additional instructions   melatonin 5 MG Tabs Take 5 mg by mouth at bedtime.   multivitamin tablet Take 1 tablet by mouth daily.   nitroGLYCERIN 0.4 MG SL tablet Commonly known as: NITROSTAT Place 1 tablet (0.4 mg total) under the tongue every 5 (five) minutes as needed for chest pain.   omeprazole 20 MG capsule Commonly known as: PRILOSEC TAKE 1  CAPSULE BY MOUTH EVERY DAY   oxyCODONE-acetaminophen 10-325 MG tablet Commonly known as: PERCOCET Take 1 tablet by mouth every 8 (eight) hours as needed for pain.   pregabalin 75 MG capsule Commonly known as: LYRICA TAKE 1 CAPSULE BY MOUTH TWICE A DAY   PROBIOTIC PO Take 1 capsule by mouth daily.   RA RENEWAL DARK SPOT CORRECTOR EX Apply 1 application topically daily as needed (dark spots).   Restasis 0.05 % ophthalmic emulsion Generic drug: cycloSPORINE Place 1 drop into both eyes 2 (two) times daily.   Rybelsus 7 MG Tabs Generic drug: Semaglutide Take 7 mg by mouth daily. Via [employer] pt assistance   sildenafil 20 MG tablet Commonly known as: REVATIO Take 40-100 mg by mouth daily as needed.   silodosin 8 MG Caps capsule Commonly known as: RAPAFLO Take 8 mg by mouth daily.   telmisartan 40 MG tablet Commonly known as: MICARDIS Take 1 tablet (40 mg total) by mouth daily.   VITAMIN D (CHOLECALCIFEROL) PO Take 1 capsule by mouth daily.         Follow-up Information     Binnie Rail, MD. Schedule an appointment as soon as possible for a visit in 1 week(s).   Specialty: Internal Medicine Contact information: [name] [address] [phone]         Jenetta Downer, MD. Schedule an appointment as soon as possible for a visit in 1 week(s).   Specialty: Otolaryngology Contact information: [place] [address] [phone]                 Major procedures and Radiology Reports - PLEASE review detailed and final reports thoroughly  -        CT MAXILLOFACIAL W CONTRAST  Result Date: 09/07/2022  CLINICAL DATA:  Sublingual/submandibular abscess EXAM: CT MAXILLOFACIAL WITH CONTRAST TECHNIQUE: Multidetector CT imaging of the maxillofacial structures was performed with intravenous contrast. Multiplanar CT image reconstructions were also generated. RADIATION DOSE REDUCTION: This exam was performed according to  the departmental dose-optimization program which includes automated exposure control, adjustment of the mA and/or kV according to patient size and/or use of iterative reconstruction technique. CONTRAST:  33mL ISOVUE-370 IOPAMIDOL (ISOVUE-370) INJECTION 76% COMPARISON:  Maxillofacial CT March 2024. FINDINGS: Osseous: No fracture or mandibular dislocation. No destructive process. Orbits: Negative. No traumatic or inflammatory finding. Sinuses: Largely clear sinuses. Soft tissues: Persistent edema along the posterior aspect of the left mandible. Interval postoperative change including packing material in this region. Decreased but persistent fluid collection anterior to the packing material, measuring 1.7 x 1.2 x 1.3 cm which abuts the adjacent mandible (for example see series 3, image 28 and series 7, image 63). Surrounding edema. Limited intracranial: No significant or unexpected finding. IMPRESSION: Persistent edema along the posterior aspect of the left mandible. Interval postoperative change including packing material in this region. Decreased but persistent 1 only 7 cm abscess, anterior to the tectum anteriorly and abutting the mandible. Electronically Signed   By: Margaretha Sheffield M.D.   On: 09/07/2022 09:27   DG Chest Port 1 View  Result Date: 09/05/2022 CLINICAL DATA:  Shortness of breath EXAM: PORTABLE CHEST - 1 VIEW COMPARISON:  01/27/2022 FINDINGS: Lungs are clear. Heart size and mediastinal contours are within normal limits. Aortic Atherosclerosis (ICD10-170.0). Monitoring device overlies the left hemithorax. Mild blunting of the right lateral costophrenic angle. Visualized bones unremarkable. IMPRESSION: Possible small right effusion. No acute findings. Electronically Signed   By: Lucrezia Europe M.D.   On: 09/05/2022 07:49   CT Maxillofacial W Contrast  Result Date: 09/02/2022 CLINICAL DATA:  Initial evaluation for painful submandibular mass. EXAM: CT MAXILLOFACIAL WITH CONTRAST TECHNIQUE:  Multidetector CT imaging of the maxillofacial structures was performed with intravenous contrast. Multiplanar CT image reconstructions were also generated. RADIATION DOSE REDUCTION: This exam was performed according to the departmental dose-optimization program which includes automated exposure control, adjustment of the mA and/or kV according to patient size and/or use of iterative reconstruction technique. CONTRAST:  21mL OMNIPAQUE IOHEXOL 350 MG/ML SOLN COMPARISON:  None Available. FINDINGS: Osseous: No acute osseous finding. No discrete or worrisome osseous lesions. Multilevel degenerative spondylosis and facet arthrosis noted within the visualized spine. Orbits: Prior bilateral ocular lens replacement. Otherwise unremarkable. Sinuses: Small right sphenoid sinus retention cyst noted. Paranasal sinuses are otherwise largely clear. Mastoid air cells and middle ear cavities are well pneumatized and free of fluid. Soft tissues: Localized soft tissue swelling with inflammatory stranding seen involving the lower left face near the angle of the left mandible. Inflammatory changes primarily involve the left masticator and submandibular spaces. Superimposed irregular hypodense collection at this location measures 2.6 x 1.9 x 1.7 cm, consistent with abscess (series 3, image 29). Exact source of this infection not identified by CT. No significant dental disease is visualized. Salivary glands including the parotid and submandibular glands are within normal limits. Limited intracranial: Age-related cerebral atrophy. Otherwise unremarkable. IMPRESSION: Localized soft tissue swelling with inflammatory stranding involving the lower left face near the angle of the left mandible, consistent with infection/cellulitis. Superimposed 2.6 x 1.9 x 1.7 cm abscess as above. Electronically Signed   By: Jeannine Boga M.D.   On: 09/02/2022 22:11   CUP PACEART REMOTE DEVICE CHECK  Result Date: 09/01/2022 ILR summary report  received. Battery status OK. Normal  device function. No new symptom, tachy, brady, or pause episodes. 18 new AF episodes, longest duration 2hr 25min, CVR, OAC contraindicated, burden 1.6%,  ASA Monthly summary reports and ROV/PRN LA  CT SOFT TISSUE NECK W CONTRAST  Result Date: 08/28/2022 CLINICAL DATA:  Left-sided submandibular swelling and pain. Creatinine was obtained on site at [place] at [address] Results: Creatinine 1.4 mg/dL. EXAM: CT NECK WITH CONTRAST TECHNIQUE: Multidetector CT imaging of the neck was performed using the standard protocol following the bolus administration of intravenous contrast. RADIATION DOSE REDUCTION: This exam was performed according to the departmental dose-optimization program which includes automated exposure control, adjustment of the mA and/or kV according to patient size and/or use of iterative reconstruction technique. CONTRAST:  59mL ISOVUE-300 IOPAMIDOL (ISOVUE-300) INJECTION 61% COMPARISON:  None Available. FINDINGS: Pharynx and larynx: No evidence of a mass or swelling. Widely patent airway. Medial positioning of the right vocal cord with asymmetric enlargement of the right para form sinus. No fluid collection or significant inflammatory changes in the parapharyngeal or retropharyngeal spaces. Salivary glands: There is prominent inflammation in the left lower face. This involves the lateral aspect of the left submandibular space but appears largely separate from the submandibular gland itself which is normal in appearance. There is a 2 cm low-density focus lateral to the left submandibular gland which has a small amount of central hyperenhancement with extensive surrounding inflammation (series 3, image 62). Inflammation involves the left masseter muscle which appears enlarged/edematous, and there is diffuse infiltration of the subcutaneous fat of the left lateral face extending into the submental region. The parotid glands are unremarkable. No  salivary stone. Thyroid: Unremarkable. Lymph nodes: Possible abnormal left submandibular lymph node described above. Scattered normal sized lymph nodes elsewhere in the neck bilaterally, all less than 1 cm in short axis. Vascular: Aortic atherosclerosis.  Mild carotid atherosclerosis. Limited intracranial: Unremarkable. Visualized orbits: Bilateral cataract extraction. Mastoids and visualized paranasal sinuses: Tiny mucous retention cyst in the right sphenoid sinus. Clear mastoid air cells. Skeleton: No evidence of a periapical dental abscess. Advanced multilevel disc and facet degeneration in the cervical spine. Upper chest: No mass or consolidation in the lung apices. Other: None. IMPRESSION: 1. Prominent inflammatory/infectious process in the left lower face with involvement of the submandibular space and masseter muscle. This appears separate from the submandibular gland and is not felt to reflect acute sialadenitis, and an obvious dental source is not identified either. More focal 2 cm abnormality lateral to the left submandibular gland is of indeterminate etiology but could reflect a suppurated lymph node or more focal phlegmon. No evidence of a drainable abscess at this time. 2. 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 3.  Aortic Atherosclerosis (ICD10-I70.0). Electronically Signed   By: Logan Bores M.D.   On: 08/28/2022 13:46    Micro Results    Recent Results (from the past 240 hour(s))  MRSA Next Gen by PCR, Nasal     Status: None   Collection Time: 09/03/22  1:15 AM   Specimen: Nasal Mucosa; Nasal Swab  Result Value Ref Range Status   MRSA by PCR Next Gen NOT DETECTED NOT DETECTED Final    Comment: (NOTE) The GeneXpert MRSA Assay (FDA approved for NASAL specimens only), is one component of a comprehensive MRSA colonization surveillance program. It is not intended to diagnose MRSA infection nor to guide or monitor treatment for MRSA infections. Test performance is not FDA approved  in patients less than 53 years old. Performed at [facility]  [facility], [place] [address] [place], [place] [address]   Aerobic/Anaerobic Culture w Gram Stain (surgical/deep wound)     Status: None   Collection Time: 09/03/22  3:40 PM   Specimen: Abscess  Result Value Ref Range Status   Specimen Description ABSCESS LEFT FACE  Final   Special Requests NONE  Final   Gram Stain   Final    RARE WBC PRESENT, PREDOMINANTLY PMN NO ORGANISMS SEEN    Culture   Final    RARE STAPHYLOCOCCUS AUREUS NO ANAEROBES ISOLATED Performed at [facility], [place] [address] [place], [place] [address]    Report Status 09/08/2022 FINAL  Final   Organism ID, Bacteria STAPHYLOCOCCUS AUREUS  Final      Susceptibility   Staphylococcus aureus - MIC*    CIPROFLOXACIN <=0.5 SENSITIVE Sensitive     ERYTHROMYCIN <=0.25 SENSITIVE Sensitive     GENTAMICIN <=0.5 SENSITIVE Sensitive     OXACILLIN 0.5 SENSITIVE Sensitive     TETRACYCLINE <=1 SENSITIVE Sensitive     VANCOMYCIN 1 SENSITIVE Sensitive     TRIMETH/SULFA <=10 SENSITIVE Sensitive     CLINDAMYCIN <=0.25 SENSITIVE Sensitive     RIFAMPIN <=0.5 SENSITIVE Sensitive     Inducible Clindamycin NEGATIVE Sensitive     * RARE STAPHYLOCOCCUS AUREUS  Aerobic/Anaerobic Culture w Gram Stain (surgical/deep wound)     Status: None (Preliminary result)   Collection Time: 09/07/22  2:00 PM   Specimen: PATH Other; Tissue  Result Value Ref Range Status   Specimen Description ABSCESS LEFT FACE  Final   Special Requests PT ON UNASYN VANC  Final   Gram Stain NO WBC SEEN NO ORGANISMS SEEN   Final   Culture   Final    NO GROWTH < 24 HOURS Performed at [facility], [address] [place], [place] [address]    Report Status PENDING  Incomplete    Today   Subjective    Timofei Embery today has no headache,no chest abdominal pain,no new weakness tingling or numbness, feels much better wants to go home today.    Objective   Blood pressure (!) 141/110, pulse  (!) 48, temperature 97.6 F (36.4 C), temperature source Oral, resp. rate 18, height 5\' 6"  (1.676 m), weight 85.4 kg, SpO2 97 %.   Intake/Output Summary (Last 24 hours) at 09/09/2022 0748 Last data filed at 09/08/2022 2000 Gross per 24 hour  Intake --  Output 250 ml  Net -250 ml    Exam  Awake Alert, No new F.N deficits, left submandibular postop site stable with much reduced swelling [place].AT,PERRAL Supple Neck,   Symmetrical Chest wall movement, Good air movement bilaterally, CTAB RRR,No Gallops,   +ve B.Sounds, Abd Soft, Non tender,  No Cyanosis, Clubbing or edema    Data Review   Recent Labs  Lab 09/02/22 1926 09/03/22 0447 09/05/22 0240 09/06/22 0332 09/07/22 0659 09/08/22 0436 09/09/22 0340  WBC 10.8*   < > 24.8* 18.9* 13.9* 10.7* 19.0*  HGB 13.3   < > 12.2* 11.9* 11.9* 12.2* 11.6*  HCT 41.8   < > 37.3* 38.0* 36.6* 36.9* 35.0*  PLT 72*   < > 65* 62* 54* 48* 53*  MCV 91.3   < > 88.6 91.1 88.2 88.9 88.4  MCH 29.0   < > 29.0 28.5 28.7 29.4 29.3  MCHC 31.8   < > 32.7 31.3 32.5 33.1 33.1  RDW 13.9   < > 13.6 13.8 13.8 13.5 13.5  LYMPHSABS 2.4  --   --  1.9 2.6 0.7 2.9  MONOABS 2.2*  --   --  2.6* 3.8* 0.5 3.8*  EOSABS 0.1  --   --  0.0 0.0 0.0 0.0  BASOSABS 0.0  --   --  0.0 0.0 0.0 0.0   < > = values in this interval not displayed.    Recent Labs  Lab 09/03/22 0447 09/04/22 0537 09/05/22 0605 09/06/22 0332 09/07/22 0659 09/08/22 0436 09/09/22 0340  NA 138   < > 138 139 141 136 139  K 3.5   < > 3.9 3.8 3.6 4.0 4.0  CL 107   < > 100 107 104 103 103  CO2 24   < > 25 24 25 22 27   ANIONGAP 7   < > 13 8 12 11 9   GLUCOSE 93   < > 168* 103* 90 215* 171*  BUN 20   < > 32* 33* 19 21 21   CREATININE 1.31*   < > 1.32* 1.21 1.11 1.17 1.14  AST 19  --   --   --   --   --   --   ALT 17  --   --   --   --   --   --   ALKPHOS 82  --   --   --   --   --   --   BILITOT 0.8  --   --   --   --   --   --   ALBUMIN 3.4*  --   --   --   --   --   --   CRP  --   --  1.4* 0.7  0.8 2.6* 1.3*  PROCALCITON  --   --  <0.10 0.10 0.10 <0.10 <0.10  BNP  --   --   --  298.8* 257.5* 296.1* 365.2*  MG 2.2  --  2.1 2.3 2.0 2.1 1.9  CALCIUM 8.4*   < > 8.8* 8.7* 8.5* 8.4* 8.5*   < > = values in this interval not displayed.    Total Time in preparing paper work, data evaluation and todays exam - 35 minutes  Signature  -    Lala Lund M.D on 09/09/2022 at 7:48 AM   -  To page go to [email or web address]

## 2022-09-09 NOTE — Progress Notes (Signed)
Carelink Summary Report / Loop Recorder 

## 2022-09-09 NOTE — Progress Notes (Signed)
OTOLARYNGOLOGY - HEAD AND NECK SURGERY FACIAL PLASTIC & RECONSTRUCTIVE SURGERY PROGRESS NOTE  ID: 89 y/o M POD#6 s/p I&D left facial abscess, POD#2 s/p repeat I&D left facial abscess (09/07/22)  Subjective: NAEON Denies pain Eager to discharge   Objective: Vital signs in last 24 hours: Temp:  [97.6 F (36.4 C)-97.9 F (36.6 C)] 97.8 F (36.6 C) (03/26 2330) Pulse Rate:  [48] 48 (03/26 2330) Resp:  [18] 18 (03/26 2330) BP: (116-150)/(48-106) 150/58 (03/26 2330) SpO2:  [97 %] 97 % (03/26 2330)  Physical exam: General: resting comfortably in NAD Eyes: EOMI, PERRL Ears: External ears normal, no otorrhea Nose: Nares clear, no epistaxis OC/OP: MMM, no bleeding Neck: Improvement in facial edema/pain. No purulent drainage. Penrose removed. Nylon sutures in place.  Neuro: CN II-XII grossly symmetric and intact   @LABLAST2 (wbc:2,hgb:2,hct:2,plt:2) Recent Labs    09/08/22 0436 09/09/22 0340  NA 136 139  K 4.0 4.0  CL 103 103  CO2 22 27  GLUCOSE 215* 171*  BUN 21 21  CREATININE 1.17 1.14  CALCIUM 8.4* 8.5*     Medications: I have reviewed the patient's current medications.  Results reviewed: CT Max-face with contrast 09/07/22 IMPRESSION: Persistent edema along the posterior aspect of the left mandible. Interval postoperative change including packing material in this region. Decreased but persistent 1 only 7 cm abscess, anterior to the tectum anteriorly and abutting the mandible.     Electronically Signed   By: Margaretha Sheffield M.D.   On: 09/07/2022 09:27  Cultures 09/03/22  Staphylococcus aureus      MIC    CIPROFLOXACIN <=0.5 SENSI... Sensitive    CLINDAMYCIN <=0.25 SENS... Sensitive    ERYTHROMYCIN <=0.25 SENS... Sensitive    GENTAMICIN <=0.5 SENSI... Sensitive    Inducible Clindamycin NEGATIVE Sensitive    OXACILLIN 0.5 SENSITIVE Sensitive    RIFAMPIN <=0.5 SENSI... Sensitive    TETRACYCLINE <=1 SENSITIVE Sensitive    TRIMETH/SULFA <=10 SENSIT...  Sensitive    VANCOMYCIN 1 SENSITIVE Sensitive     Assessment/Plan: POD#6/2 I&D facial abscess doing well.   Penrose drain removed OK To discharge Please discharge with PO abx x 10 days Wound care: Clean wound daily with wash cloth/soap and water. Apply a layer of aqupahor, gauze and tape to keep covered.  F/u in 7-10 days for suture removal. Gave patient my office business card.  [facility] [phone], [place] [address] Phone: [phone]    LOS: 6 days   Jenetta Downer 09/09/2022, 7:35 AM  I have personally spent 20 minutes involved in face-to-face and non-face-to-face activities for this patient on the day of the visit.  Professional time spent includes the following activities, in addition to those noted in the documentation: preparing to see the patient (eg, review of tests), obtaining and/or reviewing separately obtained history, performing a medically appropriate examination and/or evaluation, counseling and educating the patient/family/caregiver, ordering medications, tests or procedures, referring and communicating with other healthcare professionals, documenting clinical information in the electronic or other health record, independently interpreting results and communicating results with the patient/family/caregiver, care coordination.  Electronically signed by:  Jenetta Downer, MD  Staff Physician Facial Plastic & Reconstructive Surgery Otolaryngology - Head and Neck Surgery [place], [place]

## 2022-09-10 ENCOUNTER — Ambulatory Visit: Payer: Self-pay

## 2022-09-10 ENCOUNTER — Telehealth: Payer: Self-pay

## 2022-09-10 NOTE — Chronic Care Management (AMB) (Signed)
   09/10/2022  Antravious Dhingra Apr 04, 1933 [id number]   Reason for Encounter: Patient is not currently enrolled in the CCM program. CCM status changed to previously enrolled.   Horris Latino RN Care Manager/Chronic Care Management [phone]

## 2022-09-10 NOTE — Transitions of Care (Post Inpatient/ED Visit) (Signed)
   09/10/2022  Name: Jonathan [name] MRN: [id number] DOB: March 04, 1933  Today's TOC FU Call Status: Today's TOC FU Call Status:: Unsuccessul Call (1st Attempt) Unsuccessful Call (1st Attempt) Date: 09/10/22  Attempted to reach the patient regarding the most recent Inpatient/ED visit.  Follow Up Plan: Additional outreach attempts will be made to reach the patient to complete the Transitions of Care (Post Inpatient/ED visit) call.   [place] LPN [place] Advisor Direct Dial [phone]

## 2022-09-12 LAB — AEROBIC/ANAEROBIC CULTURE W GRAM STAIN (SURGICAL/DEEP WOUND): Gram Stain: NONE SEEN

## 2022-09-14 NOTE — Transitions of Care (Post Inpatient/ED Visit) (Signed)
   09/14/2022  Name: Jonathan [name] MRN: [id number] DOB: [birth year]  Today's TOC FU Call Status: Today's TOC FU Call Status:: Successful TOC FU Call Competed Unsuccessful Call (1st Attempt) Date: 09/10/22 [facility] FU Call Complete Date: 09/14/22  Transition Care Management Follow-up Telephone Call Date of Discharge: 09/09/22 Discharge Facility: [facility]) Type of Discharge: Inpatient Admission Primary Inpatient Discharge Diagnosis:: facial abscess How have you been since you were released from the hospital?: Better Any questions or concerns?: No  Items Reviewed: Did you receive and understand the discharge instructions provided?: Yes Medications obtained and verified?: Yes (Medications Reviewed) Any new allergies since your discharge?: No Dietary orders reviewed?: NA Do you have support at home?: Yes People in Home: significant other  Home Care and Equipment/Supplies: [place] Ordered?: NA Any new equipment or medical supplies ordered?: NA  Functional Questionnaire: Do you need assistance with bathing/showering or dressing?: No Do you need assistance with meal preparation?: No Do you need assistance with eating?: No Do you have difficulty maintaining continence: No Do you need assistance with getting out of bed/getting out of a chair/moving?: No Do you have difficulty managing or taking your medications?: No  Follow up appointments reviewed: PCP Follow-up appointment confirmed?: Yes Date of PCP follow-up appointment?: 09/17/22 Follow-up Provider: Dr. Billey Gosling Specialist [facility] Follow-up appointment confirmed?: NA Do you need transportation to your follow-up appointment?: No Do you understand care options if your condition(s) worsen?: Yes-patient verbalized understanding    Norton Blizzard, [place] ([place])  [place] [phone]

## 2022-09-15 ENCOUNTER — Ambulatory Visit: Payer: Medicare HMO | Admitting: Internal Medicine

## 2022-09-16 ENCOUNTER — Ambulatory Visit (INDEPENDENT_AMBULATORY_CARE_PROVIDER_SITE_OTHER): Payer: Medicare HMO

## 2022-09-16 VITALS — Ht 66.0 in | Wt 194.0 lb

## 2022-09-16 DIAGNOSIS — M797 Fibromyalgia: Secondary | ICD-10-CM | POA: Diagnosis not present

## 2022-09-16 DIAGNOSIS — M6281 Muscle weakness (generalized): Secondary | ICD-10-CM | POA: Diagnosis not present

## 2022-09-16 DIAGNOSIS — R293 Abnormal posture: Secondary | ICD-10-CM | POA: Diagnosis not present

## 2022-09-16 DIAGNOSIS — J90 Pleural effusion, not elsewhere classified: Secondary | ICD-10-CM | POA: Insufficient documentation

## 2022-09-16 DIAGNOSIS — N179 Acute kidney failure, unspecified: Secondary | ICD-10-CM | POA: Insufficient documentation

## 2022-09-16 DIAGNOSIS — M545 Low back pain, unspecified: Secondary | ICD-10-CM | POA: Diagnosis not present

## 2022-09-16 DIAGNOSIS — R221 Localized swelling, mass and lump, neck: Secondary | ICD-10-CM | POA: Diagnosis not present

## 2022-09-16 DIAGNOSIS — R41841 Cognitive communication deficit: Secondary | ICD-10-CM | POA: Diagnosis not present

## 2022-09-16 DIAGNOSIS — M546 Pain in thoracic spine: Secondary | ICD-10-CM | POA: Diagnosis not present

## 2022-09-16 DIAGNOSIS — Z0000 Encounter for general adult medical examination without abnormal findings: Secondary | ICD-10-CM | POA: Diagnosis not present

## 2022-09-16 NOTE — Progress Notes (Addendum)
I connected with  Jonathan [name] on 09/16/22 by a audio enabled telemedicine application and verified that I am speaking with the correct person using two identifiers.  Patient Location: Home  Provider Location: Office/Clinic  I discussed the limitations of evaluation and management by telemedicine. The patient expressed understanding and agreed to proceed.  Subjective:   Jonathan [name] is a 89 y.o. male who presents for Medicare Annual/Subsequent preventive examination.  Review of Systems     Cardiac Risk Factors include: advanced age (>32men, >74 women);family history of premature cardiovascular disease;diabetes mellitus;dyslipidemia;hypertension;male gender;obesity (BMI >30kg/m2);sedentary lifestyle     Objective:    Today's Vitals   09/16/22 1002 09/16/22 1003  Weight: 194 lb (88 kg)   Height: 5\' 6"  (1.676 m)   PainSc: 0-No pain 0-No pain   Body mass index is 31.31 kg/m.     09/16/2022   10:04 AM 09/07/2022   11:55 AM 09/03/2022    2:05 AM 06/18/2022   11:02 AM 04/20/2022    2:08 PM 02/11/2022   11:47 AM 10/17/2021    9:05 AM  Advanced Directives  Does Patient Have a Medical Advance Directive? Yes Yes Yes Yes Yes Yes Yes  Type of Paramedic of [place];Living will Healthcare Power of [place];Out of facility DNR (pink MOST or yellow form) [place];Living will  [place];Living will   Does patient want to make changes to medical advance directive?  No - Patient declined Yes (Inpatient - patient defers changing a medical advance directive at this time - Information given) No - Patient declined  No - Patient declined   Copy of [place] in Chart? No - copy requested No - copy requested No - copy requested   Yes - validated most recent copy scanned in chart (See row information)   Would patient like information on creating a medical advance directive?    No - Patient  declined     Pre-existing out of facility DNR order (yellow form or pink MOST form)   Physician notified to receive inpatient order        Current Medications (verified) Outpatient Encounter Medications as of 09/16/2022  Medication Sig   acetaminophen (TYLENOL) 650 MG CR tablet Take 650 mg by mouth in the morning and at bedtime.   acyclovir ointment (ZOVIRAX) 5 % Apply 1 application  topically daily as needed (cold sores).   Adhesive Tape (ADHESIVE PAPER) TAPE Please supply 1 week dressing supply as needed for - Clean wound daily with wash cloth/soap and water. Apply a layer of aqupahor, gauze and tape   allopurinol (ZYLOPRIM) 300 MG tablet Take 1 tablet (300 mg total) by mouth every evening. For gout   amiodarone (PACERONE) 200 MG tablet Take 200 mg by mouth daily.   amLODipine (NORVASC) 5 MG tablet TAKE 1 TABLET (5 MG TOTAL) BY MOUTH DAILY.   atorvastatin (LIPITOR) 20 MG tablet TAKE 1 TABLET BY MOUTH EVERY DAY FOR CHOLESTEROL   Calcium Citrate-Vitamin D (CALCIUM + D PO) Take 1 tablet by mouth daily.   carboxymethylcellulose (REFRESH PLUS) 0.5 % SOLN Place 1 drop into both eyes 3 (three) times daily as needed (dry eyes).   cephALEXin (KEFLEX) 500 MG capsule Take 1 capsule (500 mg total) by mouth 3 (three) times daily for 12 days.   dapagliflozin propanediol (FARXIGA) 10 MG TABS tablet TAKE 1 TABLET (10 MG TOTAL) BY MOUTH DAILY BEFORE BREAKFAST. FOR DIABETES   Emollient (RA RENEWAL  DARK SPOT CORRECTOR EX) Apply 1 application topically daily as needed (dark spots).   FLUoxetine (PROZAC) 40 MG capsule Take 1 capsule (40 mg total) by mouth daily.   Fluticasone Propionate (ALLERGY RELIEF NA) Place 2 sprays into the nose daily as needed (allergies).   hydrochlorothiazide (HYDRODIURIL) 12.5 MG tablet TAKE 1 TABLET BY MOUTH EVERY DAY   Lancets MISC Use to check blood sugars daily. E11.9   latanoprost (XALATAN) 0.005 % ophthalmic solution Place 1 drop into both eyes at bedtime.   levothyroxine  (SYNTHROID) 150 MCG tablet Take one tab daily 6 days a week, take 2 tabs once a week (Patient taking differently: Take 150 mcg by mouth daily before breakfast.)   Lido-Menthol-Methyl Sal-Camph (CBD KINGS EX) Apply 1 Application topically as needed (pain).   Melatonin 5 MG TABS Take 5 mg by mouth at bedtime.   Multiple Vitamin (MULTIVITAMIN) tablet Take 1 tablet by mouth daily.   nitroGLYCERIN (NITROSTAT) 0.4 MG SL tablet Place 1 tablet (0.4 mg total) under the tongue every 5 (five) minutes as needed for chest pain.   omeprazole (PRILOSEC) 20 MG capsule TAKE 1 CAPSULE BY MOUTH EVERY DAY   oxyCODONE-acetaminophen (PERCOCET) 10-325 MG tablet Take 1 tablet by mouth every 8 (eight) hours as needed for pain.   pregabalin (LYRICA) 75 MG capsule TAKE 1 CAPSULE BY MOUTH TWICE A DAY   Probiotic Product (PROBIOTIC PO) Take 1 capsule by mouth daily.   RESTASIS 0.05 % ophthalmic emulsion Place 1 drop into both eyes 2 (two) times daily.    Semaglutide (RYBELSUS) 7 MG TABS Take 7 mg by mouth daily. Via [employer] pt assistance   sildenafil (REVATIO) 20 MG tablet Take 40-100 mg by mouth daily as needed.   silodosin (RAPAFLO) 8 MG CAPS capsule Take 8 mg by mouth daily.   telmisartan (MICARDIS) 40 MG tablet Take 1 tablet (40 mg total) by mouth daily.   Vibegron (GEMTESA) 75 MG TABS Take 75 mg by mouth daily.   vitamin C (ASCORBIC ACID) 500 MG tablet Take 500 mg by mouth daily.   VITAMIN D, CHOLECALCIFEROL, PO Take 1 capsule by mouth daily.   No facility-administered encounter medications on file as of 09/16/2022.    Allergies (verified) Other   History: Past Medical History:  Diagnosis Date   Abdominal aortic atherosclerosis 02/11/2022   Acute encephalopathy 05/08/2019   Allergic rhinitis, seasonal 12/04/2011   Aortic atherosclerosis 02/04/2021   Arthritis of finger of both hands 03/27/2021   Atrial fibrillation    BPH (benign prostatic hyperplasia)    Cataract    Chronic anticoagulation 12/21/2017    CHADS VASC=4, Eliquis stopped Sept 2021- recurrent falls with SDH   Chronic pain, legs and back 03/10/2017   Preferred pain management Leg and back pain   Compression fracture of thoracic vertebra 09/21/2017   COPD (chronic obstructive pulmonary disease)    2 liters O2 HS   Degeneration of thoracic intervertebral disc 09/21/2017   Degenerative joint disease of hand 07/14/2017   Dupuytren's contracture 07/12/2019   Essential hypertension 12/04/2011   Fatty liver 12/29/2020   Fatty tumor    waste and back   Fibromyalgia    Generalized anxiety disorder 06/12/2020   12/21 He is reporting severe insomnia and anxiety symptoms.  Options are limited.  We will try a low-dose lorazepam at night and during the day for anxiety and panic attacks.  He will stop lorazepam if problems.  He will stop drinking alcohol.  Discontinue BuSpar.  Reduce Cymbalta  to 1 a day.   GERD (gastroesophageal reflux disease)    Glaucoma 03/08/2016   Gout 03/08/2016   Hereditary and idiopathic peripheral neuropathy 08/28/2015   History of COVID-19 05/08/2019   2021 Post-COVID sx's -" brain fog" Try [place] man[place] supplement and B complex with niacin for neuropathy   Hyperlipidemia 03/10/2017   Hypothyroidism    Hypoxia    Insomnia    Left-sided sensorineural hearing loss 07/12/2018   Lumbar spondylosis 02/11/2022   Major depressive disorder 09/07/2016   Mild cognitive impairment of uncertain or unknown etiology 12/27/2020   Obesity (BMI 30.0-34.9) 12/08/2016   Obstructive sleep apnea    Osteoarthritis    Osteoporosis    Pain in joint of right ankle 09/15/2021   Persistent atrial fibrillation    on Eliquis   Recurrent falls 03/25/2020   PT. Treat neuropathy, insomnia.  Reduce Cymbalta to 1 a day.  Discontinue BuSpar.   Rib pain on left side 07/25/2021   Rotator cuff arthropathy of left shoulder 07/21/2019   SDH (subdural hematoma) 03/21/2020   Recurrent SDH secondary to falls at home- Sept 2019 and again  03/14/2020- Eliquis stopped.   Sebaceous cyst 06/27/2019   Spinal compression fracture seventh vertebre   Spondylolisthesis 09/07/2016   On fosamax - for about one year - Dr Alyson Ingles  10/14/17 dexa: normal dexa -- spine 2.1,   RFN -0.9,  LFN   -0.7   - no comparison on file, previous fracture      Thoracic back pain 08/10/2017   Thrombocytopenia 12/20/2020   TIA (transient ischemic attack) 08/28/2015   Type 2 diabetes mellitus 03/08/2016   Past Surgical History:  Procedure Laterality Date   APPENDECTOMY  age 70   [place] Right    early 2000s   CATARACT EXTRACTION     bilateral   CHOLECYSTECTOMY  age 41   EYE SURGERY     FOOT ARTHRODESIS Right 02/02/2013   Procedure: RIGHT HALLUX METATARSAL PHALANGEAL JOINT ARTHRODESIS ;  Surgeon: Wylene Simmer, MD;  Location: [facility];  Service: Orthopedics;  Laterality: Right;   INCISION AND DRAINAGE ABSCESS Left 09/03/2022   Procedure: INCISION AND DRAINAGE LEFT FACIAL  ABSCESS;  Surgeon: Jenetta Downer, MD;  Location: [facility];  Service: ENT;  Laterality: Left;   INCISION AND DRAINAGE ABSCESS Left 09/07/2022   Procedure: IRRIGATION AND DEBRIDMENT OF LEFT FACIAL ABSCESS;  Surgeon: Jenetta Downer, MD;  Location: [facility];  Service: ENT;  Laterality: Left;   INGUINAL HERNIA REPAIR  age 18   rt side   NASAL CONCHA BULLOSA RESECTION  age 57   PROSTATE SURGERY     SHOULDER ARTHROSCOPY W/ ROTATOR CUFF REPAIR Right    early 2000s   tonsil     VASECTOMY  age 49   Family History  Problem Relation Age of Onset   Coronary artery disease Brother    Heart disease Father    Lung cancer Father    Kidney cancer Father    Prostate cancer Father    Arthritis Mother    Lung cancer Mother    Dementia Mother        Unspecified type, not Alzheimer's disease   Social History   Socioeconomic History   Marital status: Married    Spouse name: Not on file   Number of children: 2   Years of education: 18   Highest education level: Master's  degree (e.g., MA, MS, MEng, MEd, MSW, MBA)  Occupational History   Occupation: Retired  Tobacco Use   Smoking status: Former    Packs/day: 2.00    Years: 10.00    Additional pack years: 0.00    Total pack years: 20.00    Types: Cigarettes    Quit date: 07/18/1975    Years since quitting: 47.1    Passive exposure: Past   Smokeless tobacco: Never  Vaping Use   Vaping Use: Never used  Substance and Sexual Activity   Alcohol use: Not Currently    Comment: one - two drinks/week   Drug use: No   Sexual activity: Not Currently  Other Topics Concern   Not on file  Social History Narrative   Right handed   Master degree   Drinks caffeine retired   retired   Investment banker, operational of Radio broadcast assistant Strain: [place]  (09/16/2022)   Overall Financial Resource Strain (CARDIA)    Difficulty of Paying Living Expenses: Not hard at all  Food Insecurity: No Food Insecurity (09/16/2022)   Hunger Vital Sign    Worried About Running Out of Food in the Last Year: Never true    Ran Out of Food in the Last Year: Never true  Transportation Needs: No Transportation Needs (09/16/2022)   PRAPARE - Hydrologist (Medical): No    Lack of Transportation (Non-Medical): No  Physical Activity: Inactive (09/16/2022)   Exercise Vital Sign    Days of Exercise per Week: 0 days    Minutes of Exercise per Session: 0 min  Stress: No Stress Concern Present (09/16/2022)   [place]    Feeling of Stress : Only a little  Social Connections: Unknown (09/16/2022)   Social Connection and Isolation Panel [NHANES]    Frequency of Communication with Friends and Family: More than three times a week    Frequency of Social Gatherings with Friends and Family: Three times a week    Attends Religious Services: Patient declined    Active Member of Clubs or Organizations: Yes    Attends Music therapist: More than 4  times per year    Marital Status: Living with partner    Tobacco Counseling Counseling given: Not Answered   Clinical Intake:  Pre-visit preparation completed: Yes  Pain : No/denies pain Pain Score: 0-No pain     BMI - recorded: 31.31 Nutritional Status: BMI > 30  Obese Nutritional Risks: None Diabetes: Yes CBG done?: No Did pt. bring in CBG monitor from home?: No  How often do you need to have someone help you when you read instructions, pamphlets, or other written materials from your doctor or pharmacy?: 1 - Never What is the last grade level you completed in school?: HSG  Nutrition Risk Assessment:  Has the patient had any N/V/D within the last 2 months?  No  Does the patient have any non-healing wounds?  No  Has the patient had any unintentional weight loss or weight gain?  No   Diabetes:  Is the patient diabetic?  Yes  If diabetic, was a CBG obtained today?  No  Did the patient bring in their glucometer from home?  No  How often do you monitor your CBG's? Once daily.   Financial Strains and Diabetes Management:  Are you having any financial strains with the device, your supplies or your medication? No .  Does the patient want to be seen by Chronic Care Management for management of their diabetes?  No  Would  the patient like to be referred to a Nutritionist or for Diabetic Management?  No   Diabetic Exams:  Diabetic Eye Exam: Completed 04/28/2022 Diabetic Foot Exam: Completed 01/01/2021; Patient is overdue to foot exam.   Interpreter Needed?: No  Information entered by :: Lisette Abu, LPN.   Activities of Daily Living    09/16/2022   10:15 AM 09/03/2022    2:12 AM  In your present state of health, do you have any difficulty performing the following activities:  Hearing? 1   Vision? 0   Difficulty concentrating or making decisions? 1   Walking or climbing stairs? 0   Dressing or bathing? 0   Doing errands, shopping? 0 0  Preparing Food and  eating ? N   Using the Toilet? N   In the past six months, have you accidently leaked urine? N   Do you have problems with loss of bowel control? N   Managing your Medications? N   Managing your Finances? N   Housekeeping or managing your Housekeeping? N     Patient Care Team: Binnie Rail, MD as PCP - General (Internal Medicine) Stanford Breed Denice Bors, MD as PCP - Cardiology (Cardiology) Constance Haw, MD as PCP - Electrophysiology (Cardiology) Council Mechanic, MD as Referring Physician (Family Medicine) Earnie Larsson, MD as Consulting Physician (Neurosurgery) Stanford Breed Denice Bors, MD as Consulting Physician (Cardiology) Michael Boston, MD as Consulting Physician (General Surgery) Karen Kays, NP as Nurse Practitioner (Nurse Practitioner) [name], [facility] as Pharmacist (Pharmacist)  Indicate any recent Medical Services you may have received from other than [facility] providers in the past year (date may be approximate).     Assessment:   This is a routine wellness examination for [place].  Hearing/Vision screen Hearing Screening - Comments:: Patient wears hearing aids. Vision Screening - Comments:: Wears rx glasses - up to date with routine eye exams with Jola Schmidt, MD.   Dietary issues and exercise activities discussed: Current Exercise Habits: The patient does not participate in regular exercise at present, Exercise limited by: cardiac condition(s);respiratory conditions(s);orthopedic condition(s);neurologic condition(s)   Goals Addressed             This Visit's Progress    My goal for 2024 is to continue with physical therapy to improve mobility.        Depression Screen    09/16/2022   10:12 AM 09/02/2022    9:26 AM 08/25/2022    2:48 PM 08/04/2022    2:20 PM 07/09/2021   12:16 PM 04/07/2021    1:05 PM 11/08/2020    4:16 PM  PHQ 2/9 Scores  PHQ - 2 Score 1 1 0 4 2 0 1  PHQ- 9 Score 3 3 0 9 6 4 7     Fall Risk    09/16/2022   10:15 AM 09/02/2022     9:26 AM 08/25/2022    2:49 PM 08/04/2022    2:19 PM 04/20/2022    2:08 PM  Fall Risk   Falls in the past year? 1 0 0 0 0  Number falls in past yr: 1 0 0 0 0  Injury with Fall? 1 0 0 0 0  Risk for fall due to : History of fall(s);Impaired balance/gait;Orthopedic patient No Fall Risks No Fall Risks No Fall Risks   Follow up Education provided;Falls prevention discussed Falls evaluation completed Falls evaluation completed Falls evaluation completed Falls evaluation completed    FALL RISK PREVENTION PERTAINING TO THE HOME:  Any stairs  in or around the home? No  If so, are there any without handrails? No  Home free of loose throw rugs in walkways, pet beds, electrical cords, etc? Yes  Adequate lighting in your home to reduce risk of falls? Yes   ASSISTIVE DEVICES UTILIZED TO PREVENT FALLS:  Life alert? No  Use of a cane, walker or w/c? Yes  Grab bars in the bathroom? Yes  Shower chair or bench in shower? No  Elevated toilet seat or a handicapped toilet? Yes   TIMED UP AND GO:  Was the test performed? No . Telephonic Visit  Cognitive Function:    04/26/2019    2:53 PM 04/20/2018   11:40 AM 04/19/2017   10:51 AM 08/28/2015    3:20 PM  MMSE - Mini Mental State Exam  Not completed: Refused  Refused   Orientation to time  5  5  Orientation to Place  5  5  Registration  3  3  Attention/ Calculation  5  5  Recall  2  2  Language- name 2 objects  2  2  Language- repeat  1  1  Language- follow 3 step command  3  3  Language- read & follow direction  1  1  Write a sentence  1  1  Copy design  1  1  Total score  29  29      04/20/2022    2:00 PM 07/30/2021    8:00 AM  Montreal Cognitive Assessment   Visuospatial/ Executive (0/5) 4 4  Naming (0/3) 3 3  Attention: Read list of digits (0/2) 2 2  Attention: Read list of letters (0/1) 1 1  Attention: Serial 7 subtraction starting at 100 (0/3) 3 3  Language: Repeat phrase (0/2) 2 2  Language : Fluency (0/1) 1 1  Abstraction (0/2)  2 1  Delayed Recall (0/5) 4 4  Orientation (0/6) 6 6  Total 28 27  Adjusted Score (based on education) 28 27      09/16/2022   10:16 AM 04/26/2020    9:44 AM  6CIT Screen  What Year? 0 points 0 points  What month? 0 points 0 points  What time? 0 points 0 points  Count back from 20 0 points 0 points  Months in reverse 0 points 0 points  Repeat phrase 0 points 0 points  Total Score 0 points 0 points    Immunizations Immunization History  Administered Date(s) Administered   COVID-19, mRNA, vaccine(Comirnaty)12 years and older 03/09/2022   Fluad Quad(high Dose 65+) 03/25/2020, 04/07/2021, 03/28/2022   Hepatitis A 07/25/2007, 01/22/2008   Hepatitis B 04/30/1988, 06/02/1988, 10/28/1988   IPV 05/15/1996   Influenza Split 02/19/2012, 03/13/2016   Influenza Whole 02/17/2011, 03/13/2013   Influenza, High Dose Seasonal PF 03/10/2017, 02/15/2018, 02/09/2019   Influenza,inj,Quad PF,6+ Mos 03/15/2015   Influenza,inj,quad, With Preservative 02/14/2019   Meningococcal polysaccharide vaccine (MPSV4) 05/15/1996   Moderna Covid-19 Vaccine Bivalent Booster 52yrs & up 02/25/2021, 10/06/2021   Moderna Sars-Covid-2 Vaccination 07/15/2019, 08/12/2019, 02/15/2020, 07/25/2020   PNEUMOCOCCAL CONJUGATE-20 07/09/2021   Pneumococcal Conjugate-13 12/08/2016   Pneumococcal-Unspecified 02/01/2006   Respiratory Syncytial Virus Vaccine,Recomb Aduvanted(Arexvy) 04/25/2022   Td 01/14/1995   Tdap 03/06/2018   Zoster Recombinat (Shingrix) 01/15/2017, 01/04/2018   Zoster, Live 02/01/2006    TDAP status: Up to date  Flu Vaccine status: Up to date  Pneumococcal vaccine status: Up to date  Covid-19 vaccine status: Completed vaccines  Qualifies for Shingles Vaccine? Yes   Zostavax completed  Yes   Shingrix Completed?: Yes  Screening Tests Health Maintenance  Topic Date Due   FOOT EXAM  01/01/2022   COVID-19 Vaccine (8 - 2023-24 season) 05/04/2022   DEXA SCAN  10/15/2022   INFLUENZA VACCINE   01/14/2023   OPHTHALMOLOGY EXAM  04/29/2023   Medicare Annual Wellness (AWV)  09/16/2023   DTaP/Tdap/Td (3 - Td or Tdap) 03/06/2028   Pneumonia Vaccine 71+ Years old  Completed   Zoster Vaccines- Shingrix  Completed   HPV VACCINES  Aged Out    Health Maintenance  Health Maintenance Due  Topic Date Due   FOOT EXAM  01/01/2022   COVID-19 Vaccine (8 - 2023-24 season) 05/04/2022    Colorectal cancer screening: No longer required.   Lung Cancer Screening: (Low Dose CT Chest recommended if Age 31-80 years, 30 pack-year currently smoking OR have quit w/in 15years.) does not qualify.   Lung Cancer Screening Referral: No  Additional Screening:  Hepatitis C Screening: does not qualify; Completed: No  Vision Screening: Recommended annual ophthalmology exams for early detection of glaucoma and other disorders of the eye. Is the patient up to date with their annual eye exam?  Yes  Who is the provider or what is the name of the office in which the patient attends annual eye exams? Jola Schmidt, MD. If pt is not established with a provider, would they like to be referred to a provider to establish care? No .   Dental Screening: Recommended annual dental exams for proper oral hygiene  Community Resource Referral / Chronic Care Management: CRR required this visit?  No   CCM required this visit?  No      Plan:     I have personally reviewed and noted the following in the patient's chart:   Medical and social history Use of alcohol, tobacco or illicit drugs  Current medications and supplements including opioid prescriptions. Patient is currently taking opioid prescriptions. Information provided to patient regarding non-opioid alternatives. Patient advised to discuss non-opioid treatment plan with their provider. Functional ability and status Nutritional status Physical activity Advanced directives List of other physicians Hospitalizations, surgeries, and ER visits in previous 12  months Vitals Screenings to include cognitive, depression, and falls Referrals and appointments  In addition, I have reviewed and discussed with patient certain preventive protocols, quality metrics, and best practice recommendations. A written personalized care plan for preventive services as well as general preventive health recommendations were provided to patient.     Sheral Flow, LPN   [id number]   Nurse Notes: Normal cognitive status assessed by direct observation via telephone by this Nurse Health Advisor. No abnormalities found.

## 2022-09-16 NOTE — Progress Notes (Unsigned)
Subjective:    Patient ID: Jonathan [name], male    DOB: 1933-04-15, 89 y.o.   MRN: [id number]     HPI Jonathan [name] is here for follow up from the hospital   Admitted 3/21 - 3/27  Sent to ED 3/21 drainage of left neck abscess.  Procedure was attempted in the office of ENT under local anesthesia the day prior that was not successful.  Admitted for IV abx and surgery.    I& D 3/21 - cx MSSA I&D  3/25 - much improved after second procedure.  After procedure IV abx  changed to oral keflex  AKI - secondary to infection - resolved after IVF  PAF - NSR, continue amio, no on a/c due to fall risk/ low plt/ h/o SDH  HLD, HTN, anxiety, depression, COPD, hypothyroid, thrombocytopenia, peripheral neuropathy, OSA, obesity - stable  Frequent falls - PT/OT eval-started this yesterday.   Saw ENT yesterday for stitch removal.  Still has enlargement in neck.  He thinks it may have gone down slightly, but he still has fairly large bump.  He is eating and drinking normally.     Medications and allergies reviewed with patient and updated if appropriate.  Current Outpatient Medications on File Prior to Visit  Medication Sig Dispense Refill   acetaminophen (TYLENOL) 650 MG CR tablet Take 650 mg by mouth in the morning and at bedtime.     acyclovir ointment (ZOVIRAX) 5 % Apply 1 application  topically daily as needed (cold sores).     Adhesive Tape (ADHESIVE PAPER) TAPE Please supply 1 week dressing supply as needed for - Clean wound daily with wash cloth/soap and water. Apply a layer of aqupahor, gauze and tape 1 each 0   allopurinol (ZYLOPRIM) 300 MG tablet Take 1 tablet (300 mg total) by mouth every evening. For gout 90 tablet 3   amiodarone (PACERONE) 200 MG tablet Take 200 mg by mouth daily.     amLODipine (NORVASC) 5 MG tablet TAKE 1 TABLET (5 MG TOTAL) BY MOUTH DAILY. 90 tablet 2   atorvastatin (LIPITOR) 20 MG tablet TAKE 1 TABLET BY MOUTH EVERY DAY FOR CHOLESTEROL 90 tablet 1   Calcium  Citrate-Vitamin D (CALCIUM + D PO) Take 1 tablet by mouth daily.     carboxymethylcellulose (REFRESH PLUS) 0.5 % SOLN Place 1 drop into both eyes 3 (three) times daily as needed (dry eyes).     cephALEXin (KEFLEX) 500 MG capsule Take 1 capsule (500 mg total) by mouth 3 (three) times daily for 12 days. 36 capsule 0   dapagliflozin propanediol (FARXIGA) 10 MG TABS tablet TAKE 1 TABLET (10 MG TOTAL) BY MOUTH DAILY BEFORE BREAKFAST. FOR DIABETES 90 tablet 1   Emollient (RA RENEWAL DARK SPOT CORRECTOR EX) Apply 1 application topically daily as needed (dark spots).     FLUoxetine (PROZAC) 40 MG capsule Take 1 capsule (40 mg total) by mouth daily. 90 capsule 3   Fluticasone Propionate (ALLERGY RELIEF NA) Place 2 sprays into the nose daily as needed (allergies).     hydrochlorothiazide (HYDRODIURIL) 12.5 MG tablet TAKE 1 TABLET BY MOUTH EVERY DAY 90 tablet 1   Lancets MISC Use to check blood sugars daily. E11.9 100 each 3   latanoprost (XALATAN) 0.005 % ophthalmic solution Place 1 drop into both eyes at bedtime.     levothyroxine (SYNTHROID) 150 MCG tablet Take one tab daily 6 days a week, take 2 tabs once a week (Patient taking differently: Take 150 mcg by  mouth daily before breakfast.) 90 tablet 1   Lido-Menthol-Methyl Sal-Camph (CBD KINGS EX) Apply 1 Application topically as needed (pain).     Melatonin 5 MG TABS Take 5 mg by mouth at bedtime.     Multiple Vitamin (MULTIVITAMIN) tablet Take 1 tablet by mouth daily.     nitroGLYCERIN (NITROSTAT) 0.4 MG SL tablet Place 1 tablet (0.4 mg total) under the tongue every 5 (five) minutes as needed for chest pain. 90 tablet 3   omeprazole (PRILOSEC) 20 MG capsule TAKE 1 CAPSULE BY MOUTH EVERY DAY 90 capsule 1   oxyCODONE-acetaminophen (PERCOCET) 10-325 MG tablet Take 1 tablet by mouth every 8 (eight) hours as needed for pain.     pregabalin (LYRICA) 75 MG capsule TAKE 1 CAPSULE BY MOUTH TWICE A DAY 180 capsule 0   Probiotic Product (PROBIOTIC PO) Take 1 capsule  by mouth daily.     RESTASIS 0.05 % ophthalmic emulsion Place 1 drop into both eyes 2 (two) times daily.      Semaglutide (RYBELSUS) 7 MG TABS Take 7 mg by mouth daily. [employer] pt assistance 30 tablet 3   sildenafil (REVATIO) 20 MG tablet Take 40-100 mg by mouth daily as needed.     silodosin (RAPAFLO) 8 MG CAPS capsule Take 8 mg by mouth daily.     telmisartan (MICARDIS) 40 MG tablet Take 1 tablet (40 mg total) by mouth daily. 90 tablet 3   Vibegron (GEMTESA) 75 MG TABS Take 75 mg by mouth daily.     vitamin C (ASCORBIC ACID) 500 MG tablet Take 500 mg by mouth daily.     VITAMIN D, CHOLECALCIFEROL, PO Take 1 capsule by mouth daily.     No current facility-administered medications on file prior to visit.     Review of Systems  Constitutional:  Negative for chills and fever.  HENT:  Negative for sore throat and trouble swallowing.   Respiratory:  Negative for cough, shortness of breath and wheezing.   Cardiovascular:  Positive for leg swelling (ankles). Negative for chest pain and palpitations.  Neurological:  Positive for headaches (occasional - mild). Negative for dizziness and light-headedness.       Objective:   Vitals:   09/17/22 1108  BP: 120/64  Pulse: 60  Temp: 98.3 F (36.8 C)  SpO2: 96%   BP Readings from Last 3 Encounters:  09/17/22 120/64  09/09/22 (!) 141/110  09/02/22 120/60   Wt Readings from Last 3 Encounters:  09/17/22 193 lb (87.5 kg)  09/16/22 194 lb (88 kg)  09/07/22 188 lb 4.4 oz (85.4 kg)   Body mass index is 31.15 kg/m.    Physical Exam Constitutional:      General: He is not in acute distress.    Appearance: Normal appearance. He is not ill-appearing.  HENT:     Head: Normocephalic and atraumatic.  Eyes:     Conjunctiva/sclera: Conjunctivae normal.  Neck:     Comments: Abscess still present at the left jaw-approximately the size of a ping-pong-nontender, minimal tenderness in the neck just below the site of incision which is  healing well.  No erythema or warmth Cardiovascular:     Rate and Rhythm: Normal rate and regular rhythm.     Heart sounds: Normal heart sounds.  Pulmonary:     Effort: Pulmonary effort is normal. No respiratory distress.     Breath sounds: Rales (bibasilar crackles) present. No wheezing.  Musculoskeletal:     Right lower leg: Edema (1+ pitting) present.  Left lower leg: Edema (1+ pitting) present.  Skin:    General: Skin is warm and dry.     Findings: No rash.  Neurological:     Mental Status: He is alert. Mental status is at baseline.  Psychiatric:        Mood and Affect: Mood normal.        Lab Results  Component Value Date   WBC 19.0 (H) 09/09/2022   HGB 11.6 (L) 09/09/2022   HCT 35.0 (L) 09/09/2022   PLT 53 (L) 09/09/2022   GLUCOSE 171 (H) 09/09/2022   CHOL 92 05/20/2022   TRIG 57.0 05/20/2022   HDL 37.80 (L) 05/20/2022   LDLDIRECT 51.0 08/14/2019   LDLCALC 43 05/20/2022   ALT 17 09/03/2022   AST 19 09/03/2022   NA 139 09/09/2022   K 4.0 09/09/2022   CL 103 09/09/2022   CREATININE 1.14 09/09/2022   BUN 21 09/09/2022   CO2 27 09/09/2022   TSH 9.80 (H) 09/01/2022   INR 0.9 03/14/2020   HGBA1C 7.1 (H) 05/20/2022   MICROALBUR 51.6 (H) 07/09/2021     Assessment & Plan:    See Problem List for Assessment and Plan of chronic medical problems.

## 2022-09-16 NOTE — Patient Instructions (Addendum)
Jonathan [name] , Thank you for taking time to come for your Medicare Wellness Visit. I appreciate your ongoing commitment to your health goals. Please review the following plan we discussed and let me know if I can assist you in the future.   These are the goals we discussed:  Goals      My goal for 2024 is to continue with physical therapy to improve mobility.        This is a list of the screening recommended for you and due dates:  Health Maintenance  Topic Date Due   Complete foot exam   01/01/2022   COVID-19 Vaccine (8 - 2023-24 season) 05/04/2022   DEXA scan (bone density measurement)  10/15/2022   Flu Shot  01/14/2023   Eye exam for diabetics  04/29/2023   Medicare Annual Wellness Visit  09/16/2023   DTaP/Tdap/Td vaccine (3 - Td or Tdap) 03/06/2028   Pneumonia Vaccine  Completed   Zoster (Shingles) Vaccine  Completed   HPV Vaccine  Aged Out   Immunization History  Administered Date(s) Administered   COVID-19, mRNA, vaccine(Comirnaty)12 years and older 03/09/2022   Fluad Quad(high Dose 65+) 03/25/2020, 04/07/2021, 03/28/2022   Hepatitis A 07/25/2007, 01/22/2008   Hepatitis B 04/30/1988, 06/02/1988, 10/28/1988   IPV 05/15/1996   Influenza Split 02/19/2012, 03/13/2016   Influenza Whole 02/17/2011, 03/13/2013   Influenza, High Dose Seasonal PF 03/10/2017, 02/15/2018, 02/09/2019   Influenza,inj,Quad PF,6+ Mos 03/15/2015   Influenza,inj,quad, With Preservative 02/14/2019   Meningococcal polysaccharide vaccine (MPSV4) 05/15/1996   Moderna Covid-19 Vaccine Bivalent Booster 18yrs & up 02/25/2021, 10/06/2021   Moderna Sars-Covid-2 Vaccination 07/15/2019, 08/12/2019, 02/15/2020, 07/25/2020   PNEUMOCOCCAL CONJUGATE-20 07/09/2021   Pneumococcal Conjugate-13 12/08/2016   Pneumococcal-Unspecified 02/01/2006   Respiratory Syncytial Virus Vaccine,Recomb Aduvanted(Arexvy) 04/25/2022   Td 01/14/1995   Tdap 03/06/2018   Zoster Recombinat (Shingrix) 01/15/2017, 01/04/2018   Zoster, Live  02/01/2006   Advanced directives: Yes  Conditions/risks identified: Yes; Type II Diabetes  Next appointment: Follow up in one year for your annual wellness visit.   Preventive Care 87 Years and Older, Male  Preventive care refers to lifestyle choices and visits with your health care provider that can promote health and wellness. What does preventive care include? A yearly physical exam. This is also called an annual well check. Dental exams once or twice a year. Routine eye exams. Ask your health care provider how often you should have your eyes checked. Personal lifestyle choices, including: Daily care of your teeth and gums. Regular physical activity. Eating a healthy diet. Avoiding tobacco and drug use. Limiting alcohol use. Practicing safe sex. Taking low doses of aspirin every day. Taking vitamin and mineral supplements as recommended by your health care provider. What happens during an annual well check? The services and screenings done by your health care provider during your annual well check will depend on your age, overall health, lifestyle risk factors, and family history of disease. Counseling  Your health care provider may ask you questions about your: Alcohol use. Tobacco use. Drug use. Emotional well-being. Home and relationship well-being. Sexual activity. Eating habits. History of falls. Memory and ability to understand (cognition). Work and work Statistician. Screening  You may have the following tests or measurements: Height, weight, and BMI. Blood pressure. Lipid and cholesterol levels. These may be checked every 5 years, or more frequently if you are over 34 years old. Skin check. Lung cancer screening. You may have this screening every year starting at age 87 if you have  a 30-pack-year history of smoking and currently smoke or have quit within the past 15 years. Fecal occult blood test (FOBT) of the stool. You may have this test every year starting at  age 87. Flexible sigmoidoscopy or colonoscopy. You may have a sigmoidoscopy every 5 years or a colonoscopy every 10 years starting at age 87. Prostate cancer screening. Recommendations will vary depending on your family history and other risks. Hepatitis C blood test. Hepatitis B blood test. Sexually transmitted disease (STD) testing. Diabetes screening. This is done by checking your blood sugar (glucose) after you have not eaten for a while (fasting). You may have this done every 1-3 years. Abdominal aortic aneurysm (AAA) screening. You may need this if you are a current or former smoker. Osteoporosis. You may be screened starting at age 65 if you are at high risk. Talk with your health care provider about your test results, treatment options, and if necessary, the need for more tests. Vaccines  Your health care provider may recommend certain vaccines, such as: Influenza vaccine. This is recommended every year. Tetanus, diphtheria, and acellular pertussis (Tdap, Td) vaccine. You may need a Td booster every 10 years. Zoster vaccine. You may need this after age 35. Pneumococcal 13-valent conjugate (PCV13) vaccine. One dose is recommended after age 87. Pneumococcal polysaccharide (PPSV23) vaccine. One dose is recommended after age 92. Talk to your health care provider about which screenings and vaccines you need and how often you need them. This information is not intended to replace advice given to you by your health care provider. Make sure you discuss any questions you have with your health care provider. Document Released: 06/28/2015 Document Revised: 02/19/2016 Document Reviewed: 04/02/2015 Elsevier Interactive Patient Education  2017 [place] Prevention in the Home Falls can cause injuries. They can happen to people of all ages. There are many things you can do to make your home safe and to help prevent falls. What can I do on the outside of my home? Regularly fix the edges  of walkways and driveways and fix any cracks. Remove anything that might make you trip as you walk through a door, such as a raised step or threshold. Trim any bushes or trees on the path to your home. Use bright outdoor lighting. Clear any walking paths of anything that might make someone trip, such as rocks or tools. Regularly check to see if handrails are loose or broken. Make sure that both sides of any steps have handrails. Any raised decks and porches should have guardrails on the edges. Have any leaves, snow, or ice cleared regularly. Use sand or salt on walking paths during winter. Clean up any spills in your garage right away. This includes oil or grease spills. What can I do in the bathroom? Use night lights. Install grab bars by the toilet and in the tub and shower. Do not use towel bars as grab bars. Use non-skid mats or decals in the tub or shower. If you need to sit down in the shower, use a plastic, non-slip stool. Keep the floor dry. Clean up any water that spills on the floor as soon as it happens. Remove soap buildup in the tub or shower regularly. Attach bath mats securely with double-sided non-slip rug tape. Do not have throw rugs and other things on the floor that can make you trip. What can I do in the bedroom? Use night lights. Make sure that you have a light by your bed that is easy to  reach. Do not use any sheets or blankets that are too big for your bed. They should not hang down onto the floor. Have a firm chair that has side arms. You can use this for support while you get dressed. Do not have throw rugs and other things on the floor that can make you trip. What can I do in the kitchen? Clean up any spills right away. Avoid walking on wet floors. Keep items that you use a lot in easy-to-reach places. If you need to reach something above you, use a strong step stool that has a grab bar. Keep electrical cords out of the way. Do not use floor polish or wax that  makes floors slippery. If you must use wax, use non-skid floor wax. Do not have throw rugs and other things on the floor that can make you trip. What can I do with my stairs? Do not leave any items on the stairs. Make sure that there are handrails on both sides of the stairs and use them. Fix handrails that are broken or loose. Make sure that handrails are as long as the stairways. Check any carpeting to make sure that it is firmly attached to the stairs. Fix any carpet that is loose or worn. Avoid having throw rugs at the top or bottom of the stairs. If you do have throw rugs, attach them to the floor with carpet tape. Make sure that you have a light switch at the top of the stairs and the bottom of the stairs. If you do not have them, ask someone to add them for you. What else can I do to help prevent falls? Wear shoes that: Do not have high heels. Have rubber bottoms. Are comfortable and fit you well. Are closed at the toe. Do not wear sandals. If you use a stepladder: Make sure that it is fully opened. Do not climb a closed stepladder. Make sure that both sides of the stepladder are locked into place. Ask someone to hold it for you, if possible. Clearly mark and make sure that you can see: Any grab bars or handrails. First and last steps. Where the edge of each step is. Use tools that help you move around (mobility aids) if they are needed. These include: Canes. Walkers. Scooters. Crutches. Turn on the lights when you go into a dark area. Replace any light bulbs as soon as they burn out. Set up your furniture so you have a clear path. Avoid moving your furniture around. If any of your floors are uneven, fix them. If there are any pets around you, be aware of where they are. Review your medicines with your doctor. Some medicines can make you feel dizzy. This can increase your chance of falling. Ask your doctor what other things that you can do to help prevent falls. This  information is not intended to replace advice given to you by your health care provider. Make sure you discuss any questions you have with your health care provider. Document Released: 03/28/2009 Document Revised: 11/07/2015 Document Reviewed: 07/06/2014 Elsevier Interactive Patient Education  2017 [employer].

## 2022-09-16 NOTE — Patient Instructions (Signed)
      Blood work was ordered.   Chest xray ordered.     Medications changes include :   none

## 2022-09-17 ENCOUNTER — Ambulatory Visit (INDEPENDENT_AMBULATORY_CARE_PROVIDER_SITE_OTHER): Payer: Medicare HMO | Admitting: Internal Medicine

## 2022-09-17 ENCOUNTER — Encounter: Payer: Self-pay | Admitting: Internal Medicine

## 2022-09-17 ENCOUNTER — Ambulatory Visit (INDEPENDENT_AMBULATORY_CARE_PROVIDER_SITE_OTHER): Payer: Medicare HMO

## 2022-09-17 ENCOUNTER — Telehealth (HOSPITAL_BASED_OUTPATIENT_CLINIC_OR_DEPARTMENT_OTHER): Payer: Self-pay | Admitting: *Deleted

## 2022-09-17 VITALS — BP 120/64 | HR 60 | Temp 98.3°F | Ht 66.0 in | Wt 193.0 lb

## 2022-09-17 DIAGNOSIS — N179 Acute kidney failure, unspecified: Secondary | ICD-10-CM

## 2022-09-17 DIAGNOSIS — J90 Pleural effusion, not elsewhere classified: Secondary | ICD-10-CM

## 2022-09-17 DIAGNOSIS — D696 Thrombocytopenia, unspecified: Secondary | ICD-10-CM | POA: Diagnosis not present

## 2022-09-17 DIAGNOSIS — I10 Essential (primary) hypertension: Secondary | ICD-10-CM

## 2022-09-17 DIAGNOSIS — L0201 Cutaneous abscess of face: Secondary | ICD-10-CM

## 2022-09-17 DIAGNOSIS — J9811 Atelectasis: Secondary | ICD-10-CM | POA: Diagnosis not present

## 2022-09-17 DIAGNOSIS — M25473 Effusion, unspecified ankle: Secondary | ICD-10-CM | POA: Diagnosis not present

## 2022-09-17 DIAGNOSIS — M48061 Spinal stenosis, lumbar region without neurogenic claudication: Secondary | ICD-10-CM | POA: Insufficient documentation

## 2022-09-17 DIAGNOSIS — E1142 Type 2 diabetes mellitus with diabetic polyneuropathy: Secondary | ICD-10-CM | POA: Diagnosis not present

## 2022-09-17 DIAGNOSIS — R5383 Other fatigue: Secondary | ICD-10-CM | POA: Diagnosis not present

## 2022-09-17 LAB — BASIC METABOLIC PANEL
BUN: 10 mg/dL (ref 6–23)
CO2: 31 mEq/L (ref 19–32)
Calcium: 8.7 mg/dL (ref 8.4–10.5)
Chloride: 104 mEq/L (ref 96–112)
Creatinine, Ser: 1.04 mg/dL (ref 0.40–1.50)
GFR: 64.67 mL/min (ref >60.00)
Glucose, Bld: 94 mg/dL (ref 70–99)
Potassium: 3.8 mEq/L (ref 3.5–5.1)
Sodium: 139 mEq/L (ref 135–145)

## 2022-09-17 LAB — CBC WITH DIFFERENTIAL/PLATELET
Basophils Absolute: 0.1 10*3/uL (ref 0.0–0.1)
Basophils Relative: 0.4 % (ref 0.0–3.0)
Eosinophils Absolute: 0 10*3/uL (ref 0.0–0.7)
Eosinophils Relative: 0.3 % (ref 0.0–5.0)
HCT: 40.4 % (ref 39.0–52.0)
Hemoglobin: 13 g/dL (ref 13.0–17.0)
Lymphocytes Relative: 8.8 % — ABNORMAL LOW (ref 12.0–46.0)
Lymphs Abs: 1.5 10*3/uL (ref 0.7–4.0)
MCHC: 32.2 g/dL (ref 30.0–36.0)
MCV: 88.4 fl (ref 78.0–100.0)
Monocytes Absolute: 5.7 10*3/uL — ABNORMAL HIGH (ref 0.1–1.0)
Monocytes Relative: 32.5 % — ABNORMAL HIGH (ref 3.0–12.0)
Neutro Abs: 10.2 10*3/uL — ABNORMAL HIGH (ref 1.4–7.7)
Neutrophils Relative %: 58 % (ref 43.0–77.0)
Platelets: 55 10*3/uL — ABNORMAL LOW (ref 150.0–400.0)
RBC: 4.56 Mil/uL (ref 4.22–5.81)
RDW: 14.7 % (ref 11.5–15.5)
WBC: 17.5 10*3/uL — ABNORMAL HIGH (ref 4.0–10.5)

## 2022-09-17 LAB — BRAIN NATRIURETIC PEPTIDE: Pro B Natriuretic peptide (BNP): 257 pg/mL — ABNORMAL HIGH (ref 0.0–100.0)

## 2022-09-17 NOTE — Assessment & Plan Note (Signed)
Experiencing fatigue He states when he first got out of the hospital he felt really good and is not sure why.  Now he is back to feeling very tired and not good. He did not sleep well last night so could partially be related to poor sleep-he did sleep well and while he was in the hospital Several of his medications were held while he was in the hospital-possibly some of his fatigue is related to one of his medications If his blood work is okay we will try to see if stopping one of his medication helps-can start with Gemtesa and hold for few days and see if that makes a difference, will restart if it does not Then can try amlodipine and then finally hydrochlorothiazide, but some concern because that is a diuretic and he does have some ankle swelling He will monitor his blood pressure and let me know if he has any questions or concerns

## 2022-09-17 NOTE — Assessment & Plan Note (Signed)
New Bilateral 1+ pitting ankle edema which is unusual for him He is on hydrochlorothiazide which he is now taking daily He does have some bibasilar crackles and had evidence of a right pleural effusion while in the hospital He was getting IV fluids, but had AKI likely secondary to dehydration BMP, CBC, chest x-ray today Echocardiogram ordered to reevaluate EF, diastolic dysfunction He will monitor his ankle swelling and let me know if he develops any shortness of breath which he has not had any

## 2022-09-17 NOTE — Assessment & Plan Note (Addendum)
Recent chest x-ray while in the hospital showed possible right pleural effusion Denies shortness of breath, cough, fever at this time He does have some ankle swelling and typically does not have swelling Chest x-ray today to reevaluate Will check echocardiogram-last one done 2 years ago which was normal EF and no diastolic dysfunction

## 2022-09-17 NOTE — Assessment & Plan Note (Addendum)
Recent admission for facial abscess Saw ENT-unsuccessful attempt in office secondary to pain and deficit abscess, therefore was admitted Incision and drainage 3/21-culture showed MSSA-was not able to completely drain Incision and drainage on 3/25 with much improvement Was on IV antibiotics while in the hospital which was changed to oral Keflex which he is still taking Saw ENT for stitch removal yesterday Has follow-up with ENT in 3 weeks-still has a prominent knot at the base of his jaw-approximately the size of a ping-pong-he will monitor and if it gets larger or becomes painful he will call ENT

## 2022-09-17 NOTE — Assessment & Plan Note (Signed)
Chronic Has been stable CBC 

## 2022-09-17 NOTE — Assessment & Plan Note (Signed)
Chronic Blood pressure adequately controlled Continue current blood pressure medications including amlodipine 5 mg daily, HCTZ 12.5 mg daily and telmisartan 40 mg daily BMP

## 2022-09-17 NOTE — Assessment & Plan Note (Signed)
AKA likely secondary to infection/dehydration Resolved in the hospital with IVF Drinking plenty of fluids-encourage mostly water BMP today

## 2022-09-17 NOTE — Assessment & Plan Note (Signed)
Chronic Lab Results  Component Value Date   HGBA1C 7.1 (H) 05/20/2022   Sugars fairly controlled Stressed low sugar/carbohydrate diet Increase exercise Continue Farxiga 10 mg daily, Rybelsus 7 mg daily

## 2022-09-17 NOTE — Telephone Encounter (Signed)
Left message for patient to call and discuss scheduling the Echocardiogram ordered by Dr. Stacy Burns 

## 2022-09-18 DIAGNOSIS — M6281 Muscle weakness (generalized): Secondary | ICD-10-CM | POA: Diagnosis not present

## 2022-09-18 DIAGNOSIS — R41841 Cognitive communication deficit: Secondary | ICD-10-CM | POA: Diagnosis not present

## 2022-09-18 DIAGNOSIS — M546 Pain in thoracic spine: Secondary | ICD-10-CM | POA: Diagnosis not present

## 2022-09-18 DIAGNOSIS — R293 Abnormal posture: Secondary | ICD-10-CM | POA: Diagnosis not present

## 2022-09-18 DIAGNOSIS — M797 Fibromyalgia: Secondary | ICD-10-CM | POA: Diagnosis not present

## 2022-09-18 DIAGNOSIS — M545 Low back pain, unspecified: Secondary | ICD-10-CM | POA: Diagnosis not present

## 2022-09-19 ENCOUNTER — Other Ambulatory Visit (HOSPITAL_BASED_OUTPATIENT_CLINIC_OR_DEPARTMENT_OTHER): Payer: Self-pay | Admitting: Nurse Practitioner

## 2022-09-21 NOTE — Telephone Encounter (Signed)
Rx refill sent to pharmacy. 

## 2022-09-23 DIAGNOSIS — M797 Fibromyalgia: Secondary | ICD-10-CM | POA: Diagnosis not present

## 2022-09-23 DIAGNOSIS — R41841 Cognitive communication deficit: Secondary | ICD-10-CM | POA: Diagnosis not present

## 2022-09-23 DIAGNOSIS — M6281 Muscle weakness (generalized): Secondary | ICD-10-CM | POA: Diagnosis not present

## 2022-09-23 DIAGNOSIS — M545 Low back pain, unspecified: Secondary | ICD-10-CM | POA: Diagnosis not present

## 2022-09-23 DIAGNOSIS — R293 Abnormal posture: Secondary | ICD-10-CM | POA: Diagnosis not present

## 2022-09-23 DIAGNOSIS — M546 Pain in thoracic spine: Secondary | ICD-10-CM | POA: Diagnosis not present

## 2022-09-23 LAB — GLUCOSE, CAPILLARY
Glucose-Capillary: 99 mg/dL (ref 70–99)
Glucose-Capillary: 288 mg/dL — ABNORMAL HIGH (ref 70–99)

## 2022-09-24 DIAGNOSIS — R41841 Cognitive communication deficit: Secondary | ICD-10-CM | POA: Diagnosis not present

## 2022-09-24 DIAGNOSIS — M546 Pain in thoracic spine: Secondary | ICD-10-CM | POA: Diagnosis not present

## 2022-09-24 DIAGNOSIS — M545 Low back pain, unspecified: Secondary | ICD-10-CM | POA: Diagnosis not present

## 2022-09-24 DIAGNOSIS — M797 Fibromyalgia: Secondary | ICD-10-CM | POA: Diagnosis not present

## 2022-09-24 DIAGNOSIS — M6281 Muscle weakness (generalized): Secondary | ICD-10-CM | POA: Diagnosis not present

## 2022-09-24 DIAGNOSIS — R293 Abnormal posture: Secondary | ICD-10-CM | POA: Diagnosis not present

## 2022-09-25 DIAGNOSIS — M546 Pain in thoracic spine: Secondary | ICD-10-CM | POA: Diagnosis not present

## 2022-09-25 DIAGNOSIS — R41841 Cognitive communication deficit: Secondary | ICD-10-CM | POA: Diagnosis not present

## 2022-09-25 DIAGNOSIS — M545 Low back pain, unspecified: Secondary | ICD-10-CM | POA: Diagnosis not present

## 2022-09-25 DIAGNOSIS — R293 Abnormal posture: Secondary | ICD-10-CM | POA: Diagnosis not present

## 2022-09-25 DIAGNOSIS — M797 Fibromyalgia: Secondary | ICD-10-CM | POA: Diagnosis not present

## 2022-09-25 DIAGNOSIS — M6281 Muscle weakness (generalized): Secondary | ICD-10-CM | POA: Diagnosis not present

## 2022-09-27 ENCOUNTER — Other Ambulatory Visit: Payer: Self-pay | Admitting: Internal Medicine

## 2022-09-28 ENCOUNTER — Other Ambulatory Visit: Payer: Self-pay

## 2022-09-28 DIAGNOSIS — M797 Fibromyalgia: Secondary | ICD-10-CM | POA: Diagnosis not present

## 2022-09-28 DIAGNOSIS — M546 Pain in thoracic spine: Secondary | ICD-10-CM | POA: Diagnosis not present

## 2022-09-28 DIAGNOSIS — R293 Abnormal posture: Secondary | ICD-10-CM | POA: Diagnosis not present

## 2022-09-28 DIAGNOSIS — M545 Low back pain, unspecified: Secondary | ICD-10-CM | POA: Diagnosis not present

## 2022-09-28 DIAGNOSIS — R41841 Cognitive communication deficit: Secondary | ICD-10-CM | POA: Diagnosis not present

## 2022-09-28 DIAGNOSIS — M6281 Muscle weakness (generalized): Secondary | ICD-10-CM | POA: Diagnosis not present

## 2022-09-30 ENCOUNTER — Encounter: Payer: PPO | Admitting: Psychology

## 2022-09-30 DIAGNOSIS — M545 Low back pain, unspecified: Secondary | ICD-10-CM | POA: Diagnosis not present

## 2022-09-30 DIAGNOSIS — M6281 Muscle weakness (generalized): Secondary | ICD-10-CM | POA: Diagnosis not present

## 2022-09-30 DIAGNOSIS — R41841 Cognitive communication deficit: Secondary | ICD-10-CM | POA: Diagnosis not present

## 2022-09-30 DIAGNOSIS — R293 Abnormal posture: Secondary | ICD-10-CM | POA: Diagnosis not present

## 2022-09-30 DIAGNOSIS — M797 Fibromyalgia: Secondary | ICD-10-CM | POA: Diagnosis not present

## 2022-09-30 DIAGNOSIS — M546 Pain in thoracic spine: Secondary | ICD-10-CM | POA: Diagnosis not present

## 2022-10-02 DIAGNOSIS — M797 Fibromyalgia: Secondary | ICD-10-CM | POA: Diagnosis not present

## 2022-10-02 DIAGNOSIS — R293 Abnormal posture: Secondary | ICD-10-CM | POA: Diagnosis not present

## 2022-10-02 DIAGNOSIS — M6281 Muscle weakness (generalized): Secondary | ICD-10-CM | POA: Diagnosis not present

## 2022-10-02 DIAGNOSIS — M546 Pain in thoracic spine: Secondary | ICD-10-CM | POA: Diagnosis not present

## 2022-10-02 DIAGNOSIS — R41841 Cognitive communication deficit: Secondary | ICD-10-CM | POA: Diagnosis not present

## 2022-10-02 DIAGNOSIS — M545 Low back pain, unspecified: Secondary | ICD-10-CM | POA: Diagnosis not present

## 2022-10-04 ENCOUNTER — Encounter: Payer: Self-pay | Admitting: Internal Medicine

## 2022-10-04 ENCOUNTER — Other Ambulatory Visit: Payer: Self-pay | Admitting: Internal Medicine

## 2022-10-04 DIAGNOSIS — D72829 Elevated white blood cell count, unspecified: Secondary | ICD-10-CM

## 2022-10-05 ENCOUNTER — Ambulatory Visit (INDEPENDENT_AMBULATORY_CARE_PROVIDER_SITE_OTHER): Payer: Medicare HMO

## 2022-10-05 ENCOUNTER — Other Ambulatory Visit (INDEPENDENT_AMBULATORY_CARE_PROVIDER_SITE_OTHER): Payer: Medicare HMO

## 2022-10-05 DIAGNOSIS — M546 Pain in thoracic spine: Secondary | ICD-10-CM | POA: Diagnosis not present

## 2022-10-05 DIAGNOSIS — M797 Fibromyalgia: Secondary | ICD-10-CM | POA: Diagnosis not present

## 2022-10-05 DIAGNOSIS — M545 Low back pain, unspecified: Secondary | ICD-10-CM | POA: Diagnosis not present

## 2022-10-05 DIAGNOSIS — R293 Abnormal posture: Secondary | ICD-10-CM | POA: Diagnosis not present

## 2022-10-05 DIAGNOSIS — R55 Syncope and collapse: Secondary | ICD-10-CM

## 2022-10-05 DIAGNOSIS — D72829 Elevated white blood cell count, unspecified: Secondary | ICD-10-CM | POA: Diagnosis not present

## 2022-10-05 DIAGNOSIS — R41841 Cognitive communication deficit: Secondary | ICD-10-CM | POA: Diagnosis not present

## 2022-10-05 DIAGNOSIS — M6281 Muscle weakness (generalized): Secondary | ICD-10-CM | POA: Diagnosis not present

## 2022-10-05 DIAGNOSIS — G4733 Obstructive sleep apnea (adult) (pediatric): Secondary | ICD-10-CM | POA: Diagnosis not present

## 2022-10-05 LAB — CBC WITH DIFFERENTIAL/PLATELET
Basophils Absolute: 0 10*3/uL (ref 0.0–0.1)
Basophils Relative: 0.4 % (ref 0.0–3.0)
Eosinophils Absolute: 0 10*3/uL (ref 0.0–0.7)
Eosinophils Relative: 0.3 % (ref 0.0–5.0)
HCT: 40.8 % (ref 39.0–52.0)
Hemoglobin: 13.2 g/dL (ref 13.0–17.0)
Lymphocytes Relative: 19.6 % (ref 12.0–46.0)
Lymphs Abs: 1.6 10*3/uL (ref 0.7–4.0)
MCHC: 32.4 g/dL (ref 30.0–36.0)
MCV: 87.9 fl (ref 78.0–100.0)
Monocytes Absolute: 3 10*3/uL — ABNORMAL HIGH (ref 0.1–1.0)
Monocytes Relative: 36.6 % — ABNORMAL HIGH (ref 3.0–12.0)
Neutro Abs: 3.6 10*3/uL (ref 1.4–7.7)
Neutrophils Relative %: 43.1 % (ref 43.0–77.0)
Platelets: 63 10*3/uL — ABNORMAL LOW (ref 150.0–400.0)
RBC: 4.64 Mil/uL (ref 4.22–5.81)
RDW: 14.9 % (ref 11.5–15.5)
WBC: 8.3 10*3/uL (ref 4.0–10.5)

## 2022-10-05 LAB — CUP PACEART REMOTE DEVICE CHECK
Date Time Interrogation Session: 20240419230527
Implantable Pulse Generator Implant Date: 20230612

## 2022-10-06 ENCOUNTER — Other Ambulatory Visit: Payer: Medicare HMO

## 2022-10-06 ENCOUNTER — Other Ambulatory Visit: Payer: Self-pay

## 2022-10-06 DIAGNOSIS — D729 Disorder of white blood cells, unspecified: Secondary | ICD-10-CM

## 2022-10-06 DIAGNOSIS — D72821 Monocytosis (symptomatic): Secondary | ICD-10-CM | POA: Diagnosis not present

## 2022-10-06 DIAGNOSIS — D696 Thrombocytopenia, unspecified: Secondary | ICD-10-CM | POA: Diagnosis not present

## 2022-10-07 DIAGNOSIS — R293 Abnormal posture: Secondary | ICD-10-CM | POA: Diagnosis not present

## 2022-10-07 DIAGNOSIS — R41841 Cognitive communication deficit: Secondary | ICD-10-CM | POA: Diagnosis not present

## 2022-10-07 DIAGNOSIS — M545 Low back pain, unspecified: Secondary | ICD-10-CM | POA: Diagnosis not present

## 2022-10-07 DIAGNOSIS — M546 Pain in thoracic spine: Secondary | ICD-10-CM | POA: Diagnosis not present

## 2022-10-07 DIAGNOSIS — M797 Fibromyalgia: Secondary | ICD-10-CM | POA: Diagnosis not present

## 2022-10-07 DIAGNOSIS — M6281 Muscle weakness (generalized): Secondary | ICD-10-CM | POA: Diagnosis not present

## 2022-10-07 LAB — PATHOLOGIST SMEAR REVIEW

## 2022-10-08 ENCOUNTER — Encounter: Payer: PPO | Admitting: Psychology

## 2022-10-08 ENCOUNTER — Telehealth: Payer: Self-pay

## 2022-10-08 NOTE — Telephone Encounter (Signed)
Patient assistance arrived today for Rybelsus 7 mg.  Patient informed and medication left up front for pick up.

## 2022-10-09 NOTE — Progress Notes (Signed)
Carelink Summary Report / Loop Recorder 

## 2022-10-12 ENCOUNTER — Ambulatory Visit: Payer: PPO | Admitting: Physician Assistant

## 2022-10-12 DIAGNOSIS — M546 Pain in thoracic spine: Secondary | ICD-10-CM | POA: Diagnosis not present

## 2022-10-12 DIAGNOSIS — M545 Low back pain, unspecified: Secondary | ICD-10-CM | POA: Diagnosis not present

## 2022-10-12 DIAGNOSIS — R293 Abnormal posture: Secondary | ICD-10-CM | POA: Diagnosis not present

## 2022-10-12 DIAGNOSIS — R41841 Cognitive communication deficit: Secondary | ICD-10-CM | POA: Diagnosis not present

## 2022-10-12 DIAGNOSIS — M797 Fibromyalgia: Secondary | ICD-10-CM | POA: Diagnosis not present

## 2022-10-12 DIAGNOSIS — M6281 Muscle weakness (generalized): Secondary | ICD-10-CM | POA: Diagnosis not present

## 2022-10-14 ENCOUNTER — Other Ambulatory Visit: Payer: Self-pay | Admitting: Internal Medicine

## 2022-10-14 DIAGNOSIS — M6281 Muscle weakness (generalized): Secondary | ICD-10-CM | POA: Diagnosis not present

## 2022-10-14 DIAGNOSIS — M797 Fibromyalgia: Secondary | ICD-10-CM | POA: Diagnosis not present

## 2022-10-14 DIAGNOSIS — M545 Low back pain, unspecified: Secondary | ICD-10-CM | POA: Diagnosis not present

## 2022-10-14 DIAGNOSIS — R293 Abnormal posture: Secondary | ICD-10-CM | POA: Diagnosis not present

## 2022-10-14 DIAGNOSIS — R41841 Cognitive communication deficit: Secondary | ICD-10-CM | POA: Diagnosis not present

## 2022-10-14 DIAGNOSIS — M546 Pain in thoracic spine: Secondary | ICD-10-CM | POA: Diagnosis not present

## 2022-10-14 DIAGNOSIS — J449 Chronic obstructive pulmonary disease, unspecified: Secondary | ICD-10-CM | POA: Diagnosis not present

## 2022-10-16 ENCOUNTER — Inpatient Hospital Stay: Payer: Medicare HMO | Attending: Oncology

## 2022-10-16 ENCOUNTER — Inpatient Hospital Stay: Payer: Medicare HMO | Admitting: Oncology

## 2022-10-16 VITALS — BP 131/54 | HR 60 | Temp 98.2°F | Resp 18 | Ht 66.0 in | Wt 193.2 lb

## 2022-10-16 DIAGNOSIS — M797 Fibromyalgia: Secondary | ICD-10-CM | POA: Diagnosis not present

## 2022-10-16 DIAGNOSIS — H409 Unspecified glaucoma: Secondary | ICD-10-CM | POA: Insufficient documentation

## 2022-10-16 DIAGNOSIS — E1140 Type 2 diabetes mellitus with diabetic neuropathy, unspecified: Secondary | ICD-10-CM | POA: Insufficient documentation

## 2022-10-16 DIAGNOSIS — F519 Sleep disorder not due to a substance or known physiological condition, unspecified: Secondary | ICD-10-CM | POA: Insufficient documentation

## 2022-10-16 DIAGNOSIS — E039 Hypothyroidism, unspecified: Secondary | ICD-10-CM | POA: Insufficient documentation

## 2022-10-16 DIAGNOSIS — I4891 Unspecified atrial fibrillation: Secondary | ICD-10-CM | POA: Diagnosis not present

## 2022-10-16 DIAGNOSIS — F419 Anxiety disorder, unspecified: Secondary | ICD-10-CM | POA: Diagnosis not present

## 2022-10-16 DIAGNOSIS — I10 Essential (primary) hypertension: Secondary | ICD-10-CM | POA: Insufficient documentation

## 2022-10-16 DIAGNOSIS — G4730 Sleep apnea, unspecified: Secondary | ICD-10-CM | POA: Diagnosis not present

## 2022-10-16 DIAGNOSIS — G8929 Other chronic pain: Secondary | ICD-10-CM | POA: Insufficient documentation

## 2022-10-16 DIAGNOSIS — D696 Thrombocytopenia, unspecified: Secondary | ICD-10-CM | POA: Insufficient documentation

## 2022-10-16 DIAGNOSIS — M549 Dorsalgia, unspecified: Secondary | ICD-10-CM | POA: Diagnosis not present

## 2022-10-16 DIAGNOSIS — K118 Other diseases of salivary glands: Secondary | ICD-10-CM | POA: Diagnosis not present

## 2022-10-16 LAB — CBC WITH DIFFERENTIAL (CANCER CENTER ONLY)
Abs Immature Granulocytes: 0.32 10*3/uL — ABNORMAL HIGH (ref 0.00–0.07)
Basophils Absolute: 0.1 10*3/uL (ref 0.0–0.1)
Basophils Relative: 1 %
Eosinophils Absolute: 0 10*3/uL (ref 0.0–0.5)
Eosinophils Relative: 0 %
HCT: 40.4 % (ref 39.0–52.0)
Hemoglobin: 12.7 g/dL — ABNORMAL LOW (ref 13.0–17.0)
Immature Granulocytes: 4 %
Lymphocytes Relative: 16 %
Lymphs Abs: 1.4 10*3/uL (ref 0.7–4.0)
MCH: 28.5 pg (ref 26.0–34.0)
MCHC: 31.4 g/dL (ref 30.0–36.0)
MCV: 90.8 fL (ref 80.0–100.0)
Monocytes Absolute: 3.3 10*3/uL — ABNORMAL HIGH (ref 0.1–1.0)
Monocytes Relative: 38 %
Neutro Abs: 3.7 10*3/uL (ref 1.7–7.7)
Neutrophils Relative %: 41 %
Platelet Count: 72 10*3/uL — ABNORMAL LOW (ref 150–400)
RBC: 4.45 MIL/uL (ref 4.22–5.81)
RDW: 14.9 % (ref 11.5–15.5)
WBC Count: 8.9 10*3/uL (ref 4.0–10.5)
nRBC: 0 % (ref 0.0–0.2)

## 2022-10-16 NOTE — Progress Notes (Signed)
  [facility] OFFICE PROGRESS NOTE   Diagnosis: Thrombocytopenia  INTERVAL HISTORY:   Jonathan.  [name] returns as scheduled.  He bruises easily.  No other bleeding.  Irregular bowel habits.  He had an episode of "dry heaves "yesterday.  No consistent nausea.  He reports recent presyncope events.  He had an episode of leg weakness causing him to fall while on vacation. He underwent I&D of a left facial abscess in March.  There is a persistent nodule at the left submandibular scar. He is followed by ENT. He has chronic back pain. Objective:  Vital signs in last 24 hours:  Blood pressure (!) 131/54, pulse 60, temperature 98.2 F (36.8 C), temperature source Oral, resp. rate 18, height 5\' 6"  (1.676 m), weight 193 lb 3.2 oz (87.6 kg), SpO2 95 %.    HEENT: 2 cm area of nodularity/induration inferior to a left submandibular scar Lymphatics: No cervical, supraclavicular, axillary, or inguinal nodes Resp: Distant breath sounds, end inspiratory rhonchi at the posterior base bilaterally, no respiratory distress Cardio: Regular rate and rhythm GI: No hepatosplenomegaly, nontender, no mass Vascular: Trace edema at the left greater than right lower leg  Skin: Ecchymoses at the forearms  Lab Results:  Lab Results  Component Value Date   WBC 8.9 10/16/2022   HGB 12.7 (L) 10/16/2022   HCT 40.4 10/16/2022   MCV 90.8 10/16/2022   PLT 72 (L) 10/16/2022   NEUTROABS 3.7 10/16/2022    CMP  Lab Results  Component Value Date   NA 139 09/17/2022   K 3.8 09/17/2022   CL 104 09/17/2022   CO2 31 09/17/2022   GLUCOSE 94 09/17/2022   BUN 10 09/17/2022   CREATININE 1.04 09/17/2022   CALCIUM 8.7 09/17/2022   PROT 5.6 (L) 09/03/2022   ALBUMIN 3.4 (L) 09/03/2022   AST 19 09/03/2022   ALT 17 09/03/2022   ALKPHOS 82 09/03/2022   BILITOT 0.8 09/03/2022   GFRNONAA >60 09/09/2022   GFRAA >60 03/14/2020     Medications: I have reviewed the patient's current  medications.   Assessment/Plan: Thrombocytopenia-chronic   Atrial fibrillation-apixaban discontinued May 2023 due to recurrent falls/syncope Hypertension Fibromyalgia Diabetes Hypothyroidism Anxiety Gout Neuropathy Sleep apnea-CPAP Glaucoma Mild elevation of monocyte count Small cutaneous cyst at the perineum Left facial abscess, status post I&D March 2024    Disposition: Jonathan [name] chronic thrombocytopenia.  The hemoglobin is at the low end of the normal range.  The thrombocytopenia may be related to ITP or a myelodysplastic/myeloproliferative disorder.  He will call for spontaneous bleeding or bruising.  He will return for an office and lab visit in 4 months.  He will see Dr. Lawerance [name] to evaluate the leg weakness and presyncope.  Red cells were noted to be hypochromic and microcytic on a pathology review of the blood smear last month.  The MCV is normal.  Iron studies found the ferritin to be in the low normal range last year.  He did not wish to return to the lab for iron studies today.  We will check a ferritin when he returns in 4 months.  Thornton Papas, MD  10/16/2022  12:42 PM

## 2022-10-19 DIAGNOSIS — R41841 Cognitive communication deficit: Secondary | ICD-10-CM | POA: Diagnosis not present

## 2022-10-19 DIAGNOSIS — L0211 Cutaneous abscess of neck: Secondary | ICD-10-CM | POA: Diagnosis not present

## 2022-10-19 DIAGNOSIS — A4901 Methicillin susceptible Staphylococcus aureus infection, unspecified site: Secondary | ICD-10-CM | POA: Diagnosis not present

## 2022-10-20 ENCOUNTER — Ambulatory Visit (INDEPENDENT_AMBULATORY_CARE_PROVIDER_SITE_OTHER): Payer: Medicare HMO

## 2022-10-20 DIAGNOSIS — M25473 Effusion, unspecified ankle: Secondary | ICD-10-CM | POA: Diagnosis not present

## 2022-10-20 DIAGNOSIS — J90 Pleural effusion, not elsewhere classified: Secondary | ICD-10-CM

## 2022-10-20 DIAGNOSIS — R600 Localized edema: Secondary | ICD-10-CM | POA: Diagnosis not present

## 2022-10-20 LAB — ECHOCARDIOGRAM COMPLETE
Area-P 1/2: 3.37 cm2
Calc EF: 58.2 %
S' Lateral: 2.16 cm
Single Plane A2C EF: 57.1 %
Single Plane A4C EF: 58.8 %

## 2022-10-21 DIAGNOSIS — M797 Fibromyalgia: Secondary | ICD-10-CM | POA: Diagnosis not present

## 2022-10-21 DIAGNOSIS — R41841 Cognitive communication deficit: Secondary | ICD-10-CM | POA: Diagnosis not present

## 2022-10-21 DIAGNOSIS — M6281 Muscle weakness (generalized): Secondary | ICD-10-CM | POA: Diagnosis not present

## 2022-10-21 DIAGNOSIS — R293 Abnormal posture: Secondary | ICD-10-CM | POA: Diagnosis not present

## 2022-10-21 DIAGNOSIS — M545 Low back pain, unspecified: Secondary | ICD-10-CM | POA: Diagnosis not present

## 2022-10-21 DIAGNOSIS — M546 Pain in thoracic spine: Secondary | ICD-10-CM | POA: Diagnosis not present

## 2022-10-22 ENCOUNTER — Encounter: Payer: Self-pay | Admitting: Internal Medicine

## 2022-10-22 DIAGNOSIS — R351 Nocturia: Secondary | ICD-10-CM | POA: Diagnosis not present

## 2022-10-22 DIAGNOSIS — N401 Enlarged prostate with lower urinary tract symptoms: Secondary | ICD-10-CM | POA: Diagnosis not present

## 2022-10-22 DIAGNOSIS — R350 Frequency of micturition: Secondary | ICD-10-CM | POA: Diagnosis not present

## 2022-10-22 NOTE — Progress Notes (Signed)
Subjective:    Patient ID: Jonathan [name], male    DOB: [birth year], 89 y.o.   MRN: [id number]      HPI Jonathan [name] is here for  Chief Complaint  Patient presents with   Medical Management of Chronic Issues    Blood pressure follow up; List of readings on counter    Legs are weak and gave out - had two occasions of bending down to pick something up and his legs given out on him.  Currently doing PT which is working on leg strength.     BP variable - has had a couple of days with low BP's 98/44, 88/43, 104/48.  Does not drink a lot of water - just started drinking sugar free gatorade.      Taking amlodipine 5 mg daily, hctz 12.5 mg daily, telmisartan 40 mg daily  Not sleeping well.  At least 1 night a week he has significant sleep issues and may only sleep 30 minutes.  He does get up and read.  For a while he was on different sleep medications, but had major side effects and has not been on any recently.  He does take melatonin.  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  He is DNR and would like a DNR sheet for his refrigerator    Medications and allergies reviewed with patient and updated if appropriate.  Current Outpatient Medications on File Prior to Visit  Medication Sig Dispense Refill   acetaminophen (TYLENOL) 650 MG CR tablet Take 650 mg by mouth in the morning and at bedtime.     allopurinol (ZYLOPRIM) 300 MG tablet TAKE 1 TABLET (300 MG TOTAL) BY MOUTH EVERY EVENING. FOR GOUT 90 tablet 3   amiodarone (PACERONE) 200 MG tablet TAKE 1 TABLET EVERY DAY 90 tablet 1   amLODipine (NORVASC) 5 MG tablet TAKE 1 TABLET (5 MG TOTAL) BY MOUTH DAILY. 90 tablet 2   atorvastatin (LIPITOR) 20 MG tablet TAKE 1 TABLET BY MOUTH EVERY DAY FOR CHOLESTEROL 90 tablet 1   Calcium Citrate-Vitamin D (CALCIUM + D PO) Take 1 tablet by mouth daily.     carboxymethylcellulose (REFRESH PLUS) 0.5 % SOLN Place 1 drop into both eyes 3 (three) times daily as needed (dry eyes).      dapagliflozin propanediol (FARXIGA) 10 MG TABS tablet TAKE 1 TABLET (10 MG TOTAL) BY MOUTH DAILY BEFORE BREAKFAST. FOR DIABETES 90 tablet 1   Emollient (RA RENEWAL DARK SPOT CORRECTOR EX) Apply 1 application topically daily as needed (dark spots).     FLUoxetine (PROZAC) 20 MG capsule TAKE 1 CAPSULE EVERY DAY ALONG WITH 10MG  TO EQUAL 30MG  TOTAL 90 capsule 1   Fluticasone Propionate (ALLERGY RELIEF NA) Place 2 sprays into the nose daily as needed (allergies).     hydrochlorothiazide (HYDRODIURIL) 12.5 MG tablet TAKE 1 TABLET BY MOUTH EVERY DAY 90 tablet 1   Lancets MISC Use to check blood sugars daily. E11.9 100 each 3   latanoprost (XALATAN) 0.005 % ophthalmic solution Place 1 drop into both eyes at bedtime.     levothyroxine (SYNTHROID) 150 MCG tablet TAKE 1 TABLET BY MOUTH EVERY DAY 90 tablet 1   Lido-Menthol-Methyl Sal-Camph (CBD KINGS EX) Apply 1 Application topically as needed (pain).     Melatonin 5 MG TABS Take 5 mg by mouth at bedtime.     Multiple Vitamin (MULTIVITAMIN) tablet Take 1 tablet by mouth daily.     nitroGLYCERIN (NITROSTAT) 0.4 MG SL tablet Place 1 tablet (0.4  mg total) under the tongue every 5 (five) minutes as needed for chest pain. 90 tablet 3   oxyCODONE-acetaminophen (PERCOCET) 10-325 MG tablet Take 1 tablet by mouth every 8 (eight) hours as needed for pain.     pregabalin (LYRICA) 75 MG capsule TAKE 1 CAPSULE BY MOUTH TWICE A DAY 180 capsule 0   Probiotic Product (PROBIOTIC PO) Take 1 capsule by mouth daily.     RESTASIS 0.05 % ophthalmic emulsion Place 1 drop into both eyes 2 (two) times daily.      Semaglutide (RYBELSUS) 7 MG TABS Take 7 mg by mouth daily. Via [employer] pt assistance 30 tablet 3   sildenafil (REVATIO) 20 MG tablet Take 40-100 mg by mouth daily as needed.     silodosin (RAPAFLO) 8 MG CAPS capsule Take 8 mg by mouth daily.     telmisartan (MICARDIS) 40 MG tablet Take 1 tablet (40 mg total) by mouth daily. 90 tablet 3   Vibegron (GEMTESA) 75 MG TABS  Take 75 mg by mouth daily.     vitamin C (ASCORBIC ACID) 500 MG tablet Take 500 mg by mouth daily.     VITAMIN D, CHOLECALCIFEROL, PO Take 1 capsule by mouth daily.     acyclovir ointment (ZOVIRAX) 5 % Apply 1 application  topically daily as needed (cold sores). (Patient not taking: Reported on 10/16/2022)     FLUoxetine (PROZAC) 40 MG capsule Take 1 capsule (40 mg total) by mouth daily. (Patient not taking: Reported on 10/16/2022) 90 capsule 3   omeprazole (PRILOSEC) 20 MG capsule TAKE 1 CAPSULE BY MOUTH EVERY DAY (Patient not taking: Reported on 10/16/2022) 90 capsule 1   No current facility-administered medications on file prior to visit.    Review of Systems  Respiratory:  Negative for shortness of breath.   Cardiovascular:  Positive for leg swelling (intermitent). Negative for chest pain and palpitations.  Neurological:  Positive for headaches (occ). Negative for dizziness and light-headedness.       Objective:   Vitals:   10/23/22 1020  BP: (!) 140/60  Pulse: 60  Temp: 98 F (36.7 C)  SpO2: 95%   BP Readings from Last 3 Encounters:  10/23/22 (!) 140/60  10/16/22 (!) 131/54  09/17/22 120/64   Wt Readings from Last 3 Encounters:  10/23/22 188 lb (85.3 kg)  10/16/22 193 lb 3.2 oz (87.6 kg)  09/17/22 193 lb (87.5 kg)   Body mass index is 30.34 kg/m.    Physical Exam Constitutional:      General: He is not in acute distress.    Appearance: Normal appearance. He is not ill-appearing.  HENT:     Head: Normocephalic and atraumatic.  Eyes:     Conjunctiva/sclera: Conjunctivae normal.  Cardiovascular:     Rate and Rhythm: Normal rate and regular rhythm.     Heart sounds: Normal heart sounds.  Pulmonary:     Effort: Pulmonary effort is normal. No respiratory distress.     Breath sounds: Normal breath sounds. No wheezing or rales.  Musculoskeletal:     Right lower leg: No edema.     Left lower leg: No edema.  Skin:    General: Skin is warm and dry.     Findings: No  rash.  Neurological:     Mental Status: He is alert. Mental status is at baseline.  Psychiatric:        Mood and Affect: Mood normal.            Assessment &  Plan:    See Problem List for Assessment and Plan of chronic medical problems.

## 2022-10-23 ENCOUNTER — Ambulatory Visit (INDEPENDENT_AMBULATORY_CARE_PROVIDER_SITE_OTHER): Payer: Medicare HMO | Admitting: Internal Medicine

## 2022-10-23 VITALS — BP 140/60 | HR 60 | Temp 98.0°F | Ht 66.0 in | Wt 188.0 lb

## 2022-10-23 DIAGNOSIS — E039 Hypothyroidism, unspecified: Secondary | ICD-10-CM | POA: Diagnosis not present

## 2022-10-23 DIAGNOSIS — E1142 Type 2 diabetes mellitus with diabetic polyneuropathy: Secondary | ICD-10-CM

## 2022-10-23 DIAGNOSIS — G4700 Insomnia, unspecified: Secondary | ICD-10-CM | POA: Diagnosis not present

## 2022-10-23 DIAGNOSIS — F3289 Other specified depressive episodes: Secondary | ICD-10-CM

## 2022-10-23 DIAGNOSIS — Z7984 Long term (current) use of oral hypoglycemic drugs: Secondary | ICD-10-CM

## 2022-10-23 DIAGNOSIS — G3184 Mild cognitive impairment, so stated: Secondary | ICD-10-CM

## 2022-10-23 DIAGNOSIS — I10 Essential (primary) hypertension: Secondary | ICD-10-CM

## 2022-10-23 DIAGNOSIS — E782 Mixed hyperlipidemia: Secondary | ICD-10-CM | POA: Diagnosis not present

## 2022-10-23 MED ORDER — FLUOXETINE HCL 20 MG PO CAPS: 20.0000 mg | ORAL_CAPSULE | Freq: Every day | ORAL | 1 refills | Status: DC

## 2022-10-23 NOTE — Assessment & Plan Note (Signed)
Chronic Has been on medication for his insomnia in the past and did have recurrent falls 1 resulting in a subdural hematoma Would like to avoid medication because of the risks with memory and injury Continue melatonin Stressed continuing regular exercise He does get up and read when he is not able to get back to sleep

## 2022-10-23 NOTE — Assessment & Plan Note (Signed)
Chronic Wonders if he should be on medication to help prevent this from getting worse Previously discussed starting Aricept and possible side effects Stressed regular exercise, sleep is good as we can get it, healthy diet, social interaction and mental stimulation At his next visit we will discuss this further and consider starting Aricept 5 mg daily

## 2022-10-23 NOTE — Assessment & Plan Note (Signed)
Chronic Blood pressure has been variable at home He has been monitoring his BP for the past 2 weeks or so and he brought in readings-some days BP has been very low 88/43 and other days higher 150s/high 80s. A few of the lower numbers he relates to dehydration and after drinking fluids there was improvement in his BP He has had high numbers and low numbers and at this point I do not want to adjust his medication because of the variability He will work on hydration and see if he can balance off his blood pressure and avoid some of the lower numbers He has a follow-up in approximately 1 month and we can revisit if changes to medications needed at that time For now continue amlodipine 5 mg daily, hydrochlorothiazide 12.5 mg daily and telmisartan 40 mg daily

## 2022-10-23 NOTE — Patient Instructions (Addendum)
        Medications changes include :   none      Return for follow up as scheduled.  

## 2022-10-23 NOTE — Assessment & Plan Note (Signed)
Chronic Controlled, Stable Continue fluoxetine 20 mg daily-he did self decrease this since the weather And he is out of seasonal affective disorder

## 2022-10-27 DIAGNOSIS — M47814 Spondylosis without myelopathy or radiculopathy, thoracic region: Secondary | ICD-10-CM | POA: Diagnosis not present

## 2022-10-30 DIAGNOSIS — R293 Abnormal posture: Secondary | ICD-10-CM | POA: Diagnosis not present

## 2022-10-30 DIAGNOSIS — M545 Low back pain, unspecified: Secondary | ICD-10-CM | POA: Diagnosis not present

## 2022-10-30 DIAGNOSIS — M797 Fibromyalgia: Secondary | ICD-10-CM | POA: Diagnosis not present

## 2022-10-30 DIAGNOSIS — M546 Pain in thoracic spine: Secondary | ICD-10-CM | POA: Diagnosis not present

## 2022-10-30 DIAGNOSIS — M6281 Muscle weakness (generalized): Secondary | ICD-10-CM | POA: Diagnosis not present

## 2022-11-05 LAB — CUP PACEART REMOTE DEVICE CHECK
Date Time Interrogation Session: 20240522230921
Implantable Pulse Generator Implant Date: 20230612

## 2022-11-10 ENCOUNTER — Ambulatory Visit (INDEPENDENT_AMBULATORY_CARE_PROVIDER_SITE_OTHER): Payer: Medicare HMO

## 2022-11-10 DIAGNOSIS — R55 Syncope and collapse: Secondary | ICD-10-CM | POA: Diagnosis not present

## 2022-11-10 NOTE — Progress Notes (Signed)
Carelink Summary Report / Loop Recorder 

## 2022-11-14 DIAGNOSIS — J449 Chronic obstructive pulmonary disease, unspecified: Secondary | ICD-10-CM | POA: Diagnosis not present

## 2022-11-19 ENCOUNTER — Ambulatory Visit: Payer: PPO | Admitting: Internal Medicine

## 2022-11-19 ENCOUNTER — Other Ambulatory Visit (INDEPENDENT_AMBULATORY_CARE_PROVIDER_SITE_OTHER): Payer: Medicare HMO

## 2022-11-19 DIAGNOSIS — E039 Hypothyroidism, unspecified: Secondary | ICD-10-CM

## 2022-11-19 DIAGNOSIS — E782 Mixed hyperlipidemia: Secondary | ICD-10-CM | POA: Diagnosis not present

## 2022-11-19 DIAGNOSIS — M545 Low back pain, unspecified: Secondary | ICD-10-CM | POA: Diagnosis not present

## 2022-11-19 DIAGNOSIS — M6281 Muscle weakness (generalized): Secondary | ICD-10-CM | POA: Diagnosis not present

## 2022-11-19 DIAGNOSIS — I10 Essential (primary) hypertension: Secondary | ICD-10-CM | POA: Diagnosis not present

## 2022-11-19 DIAGNOSIS — M797 Fibromyalgia: Secondary | ICD-10-CM | POA: Diagnosis not present

## 2022-11-19 DIAGNOSIS — E1142 Type 2 diabetes mellitus with diabetic polyneuropathy: Secondary | ICD-10-CM

## 2022-11-19 DIAGNOSIS — M546 Pain in thoracic spine: Secondary | ICD-10-CM | POA: Diagnosis not present

## 2022-11-19 DIAGNOSIS — R293 Abnormal posture: Secondary | ICD-10-CM | POA: Diagnosis not present

## 2022-11-19 LAB — TSH: TSH: 4.81 u[IU]/mL (ref 0.35–5.50)

## 2022-11-19 LAB — COMPREHENSIVE METABOLIC PANEL
ALT: 35 U/L (ref 0–53)
AST: 27 U/L (ref 0–37)
Albumin: 4.2 g/dL (ref 3.5–5.2)
Alkaline Phosphatase: 88 U/L (ref 39–117)
BUN: 18 mg/dL (ref 6–23)
CO2: 30 mEq/L (ref 19–32)
Calcium: 9 mg/dL (ref 8.4–10.5)
Chloride: 104 mEq/L (ref 96–112)
Creatinine, Ser: 1.2 mg/dL (ref 0.40–1.50)
GFR: 54.4 mL/min — ABNORMAL LOW (ref >60.00)
Glucose, Bld: 97 mg/dL (ref 70–99)
Potassium: 3.7 mEq/L (ref 3.5–5.1)
Sodium: 141 mEq/L (ref 135–145)
Total Bilirubin: 0.5 mg/dL (ref 0.2–1.2)
Total Protein: 6.1 g/dL (ref 6.0–8.3)

## 2022-11-19 LAB — LIPID PANEL
Cholesterol: 69 mg/dL (ref 0–200)
HDL: 30 mg/dL — ABNORMAL LOW (ref >39.00)
LDL Cholesterol: 25 mg/dL (ref 0–99)
NonHDL: 39.48
Total CHOL/HDL Ratio: 2
Triglycerides: 72 mg/dL (ref 0.0–149.0)
VLDL: 14.4 mg/dL (ref 0.0–40.0)

## 2022-11-19 LAB — HEMOGLOBIN A1C: Hgb A1c MFr Bld: 6.2 % (ref 4.6–6.5)

## 2022-11-20 DIAGNOSIS — M546 Pain in thoracic spine: Secondary | ICD-10-CM | POA: Diagnosis not present

## 2022-11-20 DIAGNOSIS — M545 Low back pain, unspecified: Secondary | ICD-10-CM | POA: Diagnosis not present

## 2022-11-20 DIAGNOSIS — R293 Abnormal posture: Secondary | ICD-10-CM | POA: Diagnosis not present

## 2022-11-20 DIAGNOSIS — M797 Fibromyalgia: Secondary | ICD-10-CM | POA: Diagnosis not present

## 2022-11-20 DIAGNOSIS — M6281 Muscle weakness (generalized): Secondary | ICD-10-CM | POA: Diagnosis not present

## 2022-11-22 ENCOUNTER — Encounter: Payer: Self-pay | Admitting: Internal Medicine

## 2022-11-22 NOTE — Progress Notes (Unsigned)
Subjective:    Patient ID: Jonathan [name], male    DOB: [birth year], 89 y.o.   MRN: [id number]     HPI Jonathan [name] is here for follow up of his chronic medical problems.  Increased back pain.  Every joint in his body hurts.  The oxycodone helps.  He stopped PT - he did not feel like the therapist was effective.  Not exercising.   BP at home not as high as here.    Medications and allergies reviewed with patient and updated if appropriate.  Current Outpatient Medications on File Prior to Visit  Medication Sig Dispense Refill   acetaminophen (TYLENOL) 650 MG CR tablet Take 650 mg by mouth in the morning and at bedtime.     allopurinol (ZYLOPRIM) 300 MG tablet TAKE 1 TABLET (300 MG TOTAL) BY MOUTH EVERY EVENING. FOR GOUT 90 tablet 3   amLODipine (NORVASC) 5 MG tablet TAKE 1 TABLET (5 MG TOTAL) BY MOUTH DAILY. 90 tablet 2   atorvastatin (LIPITOR) 20 MG tablet TAKE 1 TABLET BY MOUTH EVERY DAY FOR CHOLESTEROL 90 tablet 1   Calcium Citrate-Vitamin D (CALCIUM + D PO) Take 1 tablet by mouth daily.     carboxymethylcellulose (REFRESH PLUS) 0.5 % SOLN Place 1 drop into both eyes 3 (three) times daily as needed (dry eyes).     dapagliflozin propanediol (FARXIGA) 10 MG TABS tablet TAKE 1 TABLET (10 MG TOTAL) BY MOUTH DAILY BEFORE BREAKFAST. FOR DIABETES 90 tablet 1   Emollient (RA RENEWAL DARK SPOT CORRECTOR EX) Apply 1 application topically daily as needed (dark spots).     FLUoxetine (PROZAC) 20 MG capsule Take 1 capsule (20 mg total) by mouth daily. 90 capsule 1   Fluticasone Propionate (ALLERGY RELIEF NA) Place 2 sprays into the nose daily as needed (allergies).     hydrochlorothiazide (HYDRODIURIL) 12.5 MG tablet TAKE 1 TABLET BY MOUTH EVERY DAY 90 tablet 1   Lancets MISC Use to check blood sugars daily. E11.9 100 each 3   latanoprost (XALATAN) 0.005 % ophthalmic solution Place 1 drop into both eyes at bedtime.     levothyroxine (SYNTHROID) 150 MCG tablet TAKE 1 TABLET BY MOUTH EVERY DAY  90 tablet 1   Lido-Menthol-Methyl Sal-Camph (CBD KINGS EX) Apply 1 Application topically as needed (pain).     Melatonin 5 MG TABS Take 5 mg by mouth at bedtime.     Multiple Vitamin (MULTIVITAMIN) tablet Take 1 tablet by mouth daily.     nitroGLYCERIN (NITROSTAT) 0.4 MG SL tablet Place 1 tablet (0.4 mg total) under the tongue every 5 (five) minutes as needed for chest pain. 90 tablet 3   omeprazole (PRILOSEC) 20 MG capsule TAKE 1 CAPSULE BY MOUTH EVERY DAY 90 capsule 1   oxyCODONE-acetaminophen (PERCOCET) 10-325 MG tablet Take 1 tablet by mouth every 8 (eight) hours as needed for pain.     pregabalin (LYRICA) 75 MG capsule TAKE 1 CAPSULE BY MOUTH TWICE A DAY 180 capsule 0   Probiotic Product (PROBIOTIC PO) Take 1 capsule by mouth daily.     RESTASIS 0.05 % ophthalmic emulsion Place 1 drop into both eyes 2 (two) times daily.      Semaglutide (RYBELSUS) 7 MG TABS Take 7 mg by mouth daily. Via [employer] pt assistance 30 tablet 3   sildenafil (REVATIO) 20 MG tablet Take 40-100 mg by mouth daily as needed.     silodosin (RAPAFLO) 8 MG CAPS capsule Take 8 mg by mouth daily.  telmisartan (MICARDIS) 40 MG tablet Take 1 tablet (40 mg total) by mouth daily. 90 tablet 3   Vibegron (GEMTESA) 75 MG TABS Take 75 mg by mouth daily.     vitamin C (ASCORBIC ACID) 500 MG tablet Take 500 mg by mouth daily.     VITAMIN D, CHOLECALCIFEROL, PO Take 1 capsule by mouth daily.     amiodarone (PACERONE) 200 MG tablet TAKE 1 TABLET EVERY DAY 90 tablet 1   No current facility-administered medications on file prior to visit.     Review of Systems  Constitutional:  Negative for fever.  Respiratory:  Negative for cough, shortness of breath and wheezing.   Cardiovascular:  Negative for chest pain, palpitations and leg swelling.  Gastrointestinal:  Positive for diarrhea (frequent).  Neurological:  Negative for light-headedness and headaches.  Psychiatric/Behavioral:  Positive for dysphoric mood and sleep  disturbance.        Objective:   Vitals:   11/23/22 0810  BP: (!) 158/60  Pulse: 81  Temp: 97.9 F (36.6 C)  SpO2: 94%   BP Readings from Last 3 Encounters:  11/23/22 (!) 158/60  10/23/22 (!) 140/60  10/16/22 (!) 131/54   Wt Readings from Last 3 Encounters:  11/23/22 188 lb 2 oz (85.3 kg)  10/23/22 188 lb (85.3 kg)  10/16/22 193 lb 3.2 oz (87.6 kg)   Body mass index is 30.36 kg/m.    Physical Exam Constitutional:      General: He is not in acute distress.    Appearance: Normal appearance. He is not ill-appearing.  HENT:     Head: Normocephalic and atraumatic.  Eyes:     Conjunctiva/sclera: Conjunctivae normal.  Cardiovascular:     Rate and Rhythm: Normal rate and regular rhythm.     Heart sounds: Normal heart sounds.  Pulmonary:     Effort: Pulmonary effort is normal. No respiratory distress.     Breath sounds: Normal breath sounds. No wheezing or rales.  Musculoskeletal:     Right lower leg: No edema.     Left lower leg: No edema.  Skin:    General: Skin is warm and dry.     Findings: No rash.  Neurological:     Mental Status: He is alert. Mental status is at baseline.  Psychiatric:        Mood and Affect: Mood normal.        Lab Results  Component Value Date   WBC 8.9 10/16/2022   HGB 12.7 (L) 10/16/2022   HCT 40.4 10/16/2022   PLT 72 (L) 10/16/2022   GLUCOSE 97 11/19/2022   CHOL 69 11/19/2022   TRIG 72.0 11/19/2022   HDL 30.00 (L) 11/19/2022   LDLDIRECT 51.0 08/14/2019   LDLCALC 25 11/19/2022   ALT 35 11/19/2022   AST 27 11/19/2022   NA 141 11/19/2022   K 3.7 11/19/2022   CL 104 11/19/2022   CREATININE 1.20 11/19/2022   BUN 18 11/19/2022   CO2 30 11/19/2022   TSH 4.81 11/19/2022   INR 0.9 03/14/2020   HGBA1C 6.2 11/19/2022   MICROALBUR 51.6 (H) 07/09/2021     Assessment & Plan:    See Problem List for Assessment and Plan of chronic medical problems.

## 2022-11-22 NOTE — Patient Instructions (Addendum)
       Medications changes include :   none     Return in about 6 months (around 05/25/2023) for Physical Exam.

## 2022-11-23 ENCOUNTER — Ambulatory Visit (INDEPENDENT_AMBULATORY_CARE_PROVIDER_SITE_OTHER): Payer: Medicare HMO | Admitting: Internal Medicine

## 2022-11-23 VITALS — BP 164/68 | HR 81 | Temp 97.9°F | Ht 66.0 in | Wt 188.1 lb

## 2022-11-23 DIAGNOSIS — M109 Gout, unspecified: Secondary | ICD-10-CM

## 2022-11-23 DIAGNOSIS — I10 Essential (primary) hypertension: Secondary | ICD-10-CM | POA: Diagnosis not present

## 2022-11-23 DIAGNOSIS — E039 Hypothyroidism, unspecified: Secondary | ICD-10-CM

## 2022-11-23 DIAGNOSIS — F3289 Other specified depressive episodes: Secondary | ICD-10-CM | POA: Diagnosis not present

## 2022-11-23 DIAGNOSIS — E1142 Type 2 diabetes mellitus with diabetic polyneuropathy: Secondary | ICD-10-CM | POA: Diagnosis not present

## 2022-11-23 DIAGNOSIS — M797 Fibromyalgia: Secondary | ICD-10-CM | POA: Diagnosis not present

## 2022-11-23 DIAGNOSIS — E782 Mixed hyperlipidemia: Secondary | ICD-10-CM

## 2022-11-23 DIAGNOSIS — G609 Hereditary and idiopathic neuropathy, unspecified: Secondary | ICD-10-CM | POA: Diagnosis not present

## 2022-11-23 DIAGNOSIS — I4819 Other persistent atrial fibrillation: Secondary | ICD-10-CM | POA: Diagnosis not present

## 2022-11-23 DIAGNOSIS — F411 Generalized anxiety disorder: Secondary | ICD-10-CM | POA: Diagnosis not present

## 2022-11-23 DIAGNOSIS — G4700 Insomnia, unspecified: Secondary | ICD-10-CM | POA: Diagnosis not present

## 2022-11-23 MED ORDER — FLUOXETINE HCL 40 MG PO CAPS: 40.0000 mg | ORAL_CAPSULE | Freq: Every day | ORAL | 1 refills | Status: DC

## 2022-11-23 NOTE — Assessment & Plan Note (Signed)
Chronic Denies gout flares Continue allopurinol 300 mg daily 

## 2022-11-23 NOTE — Assessment & Plan Note (Signed)
Chronic °Regular exercise and healthy diet encouraged °Check lipid panel  °Continue atorvastatin 20 mg daily °

## 2022-11-23 NOTE — Assessment & Plan Note (Signed)
Chronic Following with cardiology On amiodarone 200 mg daily No longer on Eliquis secondary to recurrent falls 

## 2022-11-23 NOTE — Assessment & Plan Note (Signed)
Chronic TSH in normal range Increased levothyroxine to 150 mcg daily   Lab Results  Component Value Date   TSH 4.81 11/19/2022

## 2022-11-23 NOTE — Assessment & Plan Note (Addendum)
Chronic Idiopathic Has not been taking pregabalin  Restart pregabalin 75 mg twice daily

## 2022-11-23 NOTE — Assessment & Plan Note (Addendum)
Chronic Blood pressure has been variable at home Continue amlodipine 5 mg daily, hydrochlorothiazide 12.5 mg daily and telmisartan 40 mg daily BMP reviewed-GFR slightly decreased

## 2022-11-23 NOTE — Assessment & Plan Note (Addendum)
Chronic Has been on medication for his insomnia in the past and did have recurrent falls 1 resulting in a subdural hematoma Continue melatonin Stressed continuing regular exercise He does get up and read when he is not able to get back to sleep Advised naps during the day

## 2022-11-23 NOTE — Assessment & Plan Note (Addendum)
Chronic Increased   - would like to increase back to 40 mg Increase fluoxetine to 40 mg daily

## 2022-11-23 NOTE — Assessment & Plan Note (Signed)
Chronic Lab Results  Component Value Date   HGBA1C 6.2 11/19/2022   Sugars controlled Stressed low sugar/carbohydrate diet Increase exercise Continue Farxiga 10 mg daily, Rybelsus 7 mg daily

## 2022-11-23 NOTE — Assessment & Plan Note (Addendum)
Chronic Stressed regular exercise Has not been taking lyrica  Restart pregabalin 75 mg twice daily

## 2022-11-23 NOTE — Assessment & Plan Note (Signed)
Chronic Controlled, Stable Continue fluoxetine 20 mg daily 

## 2022-12-04 ENCOUNTER — Ambulatory Visit (INDEPENDENT_AMBULATORY_CARE_PROVIDER_SITE_OTHER): Payer: Medicare HMO | Admitting: Internal Medicine

## 2022-12-04 ENCOUNTER — Encounter: Payer: Self-pay | Admitting: Internal Medicine

## 2022-12-04 VITALS — BP 128/60 | HR 58 | Temp 97.9°F | Ht 66.0 in | Wt 185.2 lb

## 2022-12-04 DIAGNOSIS — R2689 Other abnormalities of gait and mobility: Secondary | ICD-10-CM

## 2022-12-04 DIAGNOSIS — G609 Hereditary and idiopathic neuropathy, unspecified: Secondary | ICD-10-CM

## 2022-12-04 DIAGNOSIS — R296 Repeated falls: Secondary | ICD-10-CM

## 2022-12-04 DIAGNOSIS — I10 Essential (primary) hypertension: Secondary | ICD-10-CM

## 2022-12-04 DIAGNOSIS — R410 Disorientation, unspecified: Secondary | ICD-10-CM | POA: Diagnosis not present

## 2022-12-04 MED ORDER — PREGABALIN 75 MG PO CAPS: 75.0000 mg | ORAL_CAPSULE | Freq: Two times a day (BID) | ORAL | 0 refills | Status: DC

## 2022-12-04 NOTE — Progress Notes (Signed)
Subjective:    Patient ID: Jonathan [name], male    DOB: [birth year], 89 y.o.   MRN: [id number]      HPI [name] is here for No chief complaint on file. He is here with his wife today.  They both help with the history.  6/13 - walking outside at 1 point he did feel unsteady.  He stated he was having to concentrate on each step to make sure where his foot landed.  His plan was to walk a half of a mile.  He had to rest at 1/8 of a mile and felt refreshed.  He was walking and there was a very slight decline and he felt his gait change and he was rapid steps that after 2-3 feet he knew he was going to fall.  He tried to get to the tree for support and he fell.  He was unsure if he had A-fib or TIA episode.  Security was called and the nurse came with an 10 minutes.  The nurse did note that his heart rate was variable.  He was not able to stand up without help.  Later that day while in the department he knelt down to pick up a scrap of paper-he did not fall.  He was not able to stand back up.  His legs were shaking and he was not able to stand and had to call security to help him stand up.  Once he was standing he felt okay, but did not have the strength or coordination to get up by himself.  6/12-6/13 - Over a couple of days his wife feels like he had a TIA - asked her a couple of questions that were not based on reality and had difficulty doing things he should know how to do.  His mind was not there.   He has this intermittently  - not being with it.   Balance in general is off- will grab wall or furniture at home.  With his last cardiology appointment he was told he did not have any atrial fibrillation.  He does have a implanted monitor.  He is not on anticoagulation because of falls.   Medications and allergies reviewed with patient and updated if appropriate.  Current Outpatient Medications on File Prior to Visit  Medication Sig Dispense Refill   acetaminophen (TYLENOL) 650 MG CR tablet  Take 650 mg by mouth in the morning and at bedtime.     allopurinol (ZYLOPRIM) 300 MG tablet TAKE 1 TABLET (300 MG TOTAL) BY MOUTH EVERY EVENING. FOR GOUT 90 tablet 3   amiodarone (PACERONE) 200 MG tablet TAKE 1 TABLET EVERY DAY 90 tablet 1   amLODipine (NORVASC) 5 MG tablet TAKE 1 TABLET (5 MG TOTAL) BY MOUTH DAILY. 90 tablet 2   atorvastatin (LIPITOR) 20 MG tablet TAKE 1 TABLET BY MOUTH EVERY DAY FOR CHOLESTEROL 90 tablet 1   Calcium Citrate-Vitamin D (CALCIUM + D PO) Take 1 tablet by mouth daily.     carboxymethylcellulose (REFRESH PLUS) 0.5 % SOLN Place 1 drop into both eyes 3 (three) times daily as needed (dry eyes).     dapagliflozin propanediol (FARXIGA) 10 MG TABS tablet TAKE 1 TABLET (10 MG TOTAL) BY MOUTH DAILY BEFORE BREAKFAST. FOR DIABETES 90 tablet 1   Emollient (RA RENEWAL DARK SPOT CORRECTOR EX) Apply 1 application topically daily as needed (dark spots).     FLUoxetine (PROZAC) 40 MG capsule Take 1 capsule (40 mg total) by mouth daily. 90 capsule 1  Fluticasone Propionate (ALLERGY RELIEF NA) Place 2 sprays into the nose daily as needed (allergies).     hydrochlorothiazide (HYDRODIURIL) 12.5 MG tablet TAKE 1 TABLET BY MOUTH EVERY DAY 90 tablet 1   Lancets MISC Use to check blood sugars daily. E11.9 100 each 3   latanoprost (XALATAN) 0.005 % ophthalmic solution Place 1 drop into both eyes at bedtime.     levothyroxine (SYNTHROID) 150 MCG tablet TAKE 1 TABLET BY MOUTH EVERY DAY 90 tablet 1   Lido-Menthol-Methyl Sal-Camph (CBD KINGS EX) Apply 1 Application topically as needed (pain).     Melatonin 5 MG TABS Take 5 mg by mouth at bedtime.     Multiple Vitamin (MULTIVITAMIN) tablet Take 1 tablet by mouth daily.     nitroGLYCERIN (NITROSTAT) 0.4 MG SL tablet Place 1 tablet (0.4 mg total) under the tongue every 5 (five) minutes as needed for chest pain. 90 tablet 3   omeprazole (PRILOSEC) 20 MG capsule TAKE 1 CAPSULE BY MOUTH EVERY DAY 90 capsule 1   oxyCODONE-acetaminophen (PERCOCET)  10-325 MG tablet Take 1 tablet by mouth every 8 (eight) hours as needed for pain.     pregabalin (LYRICA) 75 MG capsule TAKE 1 CAPSULE BY MOUTH TWICE A DAY 180 capsule 0   Probiotic Product (PROBIOTIC PO) Take 1 capsule by mouth daily.     RESTASIS 0.05 % ophthalmic emulsion Place 1 drop into both eyes 2 (two) times daily.      Semaglutide (RYBELSUS) 7 MG TABS Take 7 mg by mouth daily. Via [employer] pt assistance 30 tablet 3   sildenafil (REVATIO) 20 MG tablet Take 40-100 mg by mouth daily as needed.     silodosin (RAPAFLO) 8 MG CAPS capsule Take 8 mg by mouth daily.     telmisartan (MICARDIS) 40 MG tablet Take 1 tablet (40 mg total) by mouth daily. 90 tablet 3   Vibegron (GEMTESA) 75 MG TABS Take 75 mg by mouth daily.     vitamin C (ASCORBIC ACID) 500 MG tablet Take 500 mg by mouth daily.     VITAMIN D, CHOLECALCIFEROL, PO Take 1 capsule by mouth daily.     No current facility-administered medications on file prior to visit.    Review of Systems  Respiratory:  Negative for shortness of breath.   Cardiovascular:  Negative for chest pain and palpitations.  Neurological:  Negative for dizziness and light-headedness.       Objective:   Vitals:   12/04/22 1344  BP: 128/60  Pulse: (!) 58  Temp: 97.9 F (36.6 C)  SpO2: 94%   BP Readings from Last 3 Encounters:  12/04/22 128/60  11/23/22 (!) 164/68  10/23/22 (!) 140/60   Wt Readings from Last 3 Encounters:  12/04/22 185 lb 3.2 oz (84 kg)  11/23/22 188 lb 2 oz (85.3 kg)  10/23/22 188 lb (85.3 kg)   Body mass index is 29.89 kg/m.    Physical Exam Constitutional:      General: He is not in acute distress.    Appearance: Normal appearance. He is not ill-appearing.  HENT:     Head: Normocephalic and atraumatic.  Eyes:     Conjunctiva/sclera: Conjunctivae normal.  Cardiovascular:     Rate and Rhythm: Normal rate and regular rhythm.     Heart sounds: Normal heart sounds.  Pulmonary:     Effort: Pulmonary effort is normal.  No respiratory distress.     Breath sounds: Normal breath sounds. No wheezing or rales.  Musculoskeletal:  Right lower leg: No edema.     Left lower leg: No edema.  Skin:    General: Skin is warm and dry.     Findings: No rash.  Neurological:     Mental Status: He is alert. Mental status is at baseline.     Sensory: No sensory deficit.     Motor: No weakness.  Psychiatric:        Mood and Affect: Mood normal.            Assessment & Plan:    See Problem List for Assessment and Plan of chronic medical problems.

## 2022-12-04 NOTE — Patient Instructions (Addendum)
       Medications changes include :   none    A referral was ordered Dr Allena Katz - neurology and someone will call you to schedule an appointment.     Return in about 6 months (around 06/05/2023) for follow up.

## 2022-12-04 NOTE — Assessment & Plan Note (Signed)
Chronic Couple weeks ago we discussed that he was not taking his Lyrica and he has had increased pain-he was going to restart this, but he never did.  He is taking more oxycodone because of the pain Advise restarting Lyrica to see if that helps and hopefully he will need less oxycodone Lyrica 75 mg twice daily prescription sent to pharmacy

## 2022-12-04 NOTE — Assessment & Plan Note (Signed)
Chronic He has had frequent falls They have occurred with walking or when he bends down They were concerned about the possibility of TIA or stroke, which is possible because he is not on any anticoagulation, but as far as he knows his monitor has not showed any atrial fibrillation-he will talk to cardiology to confirm that Peripheral neuropathy, chronic lumbar back pain, poor balance would be contributing He does furniture surf a little bit while at home Referred to neurology-Dr. Allena Katz They will continue to monitor these events and if the confusion that he had a couple of days is very with the falls or separately

## 2022-12-04 NOTE — Progress Notes (Signed)
Carelink Summary Report / Loop Recorder 

## 2022-12-04 NOTE — Assessment & Plan Note (Signed)
Chronic Intermittently he has had episodes of confusion-per his wife it can last a day or 2 like he did earlier this month She states he is not able to operate things that he should know how to operate and asked her questions that did not make any sense They are not sure how often this occurs or if it tends to occur when he has the poor balance There were concerns about possible TIA or stroke-most recent episode of intermittent confusion lasted 2 days, but this week it has been fine Will refer to neurology

## 2022-12-04 NOTE — Assessment & Plan Note (Signed)
Chronic Blood pressure controlled here today Continue amlodipine 5 mg daily, hydrochlorothiazide 12.5 mg daily and telmisartan 40 mg daily

## 2022-12-07 ENCOUNTER — Ambulatory Visit: Payer: Medicare HMO | Admitting: Internal Medicine

## 2022-12-07 ENCOUNTER — Encounter: Payer: Self-pay | Admitting: Cardiology

## 2022-12-08 LAB — CUP PACEART REMOTE DEVICE CHECK
Date Time Interrogation Session: 20240624231253
Implantable Pulse Generator Implant Date: 20230612

## 2022-12-14 ENCOUNTER — Ambulatory Visit (INDEPENDENT_AMBULATORY_CARE_PROVIDER_SITE_OTHER): Payer: Medicare HMO

## 2022-12-14 DIAGNOSIS — R55 Syncope and collapse: Secondary | ICD-10-CM

## 2022-12-14 DIAGNOSIS — J449 Chronic obstructive pulmonary disease, unspecified: Secondary | ICD-10-CM | POA: Diagnosis not present

## 2022-12-15 DIAGNOSIS — M47814 Spondylosis without myelopathy or radiculopathy, thoracic region: Secondary | ICD-10-CM | POA: Diagnosis not present

## 2022-12-16 DIAGNOSIS — Z23 Encounter for immunization: Secondary | ICD-10-CM | POA: Diagnosis not present

## 2022-12-31 NOTE — Progress Notes (Signed)
Carelink Summary Report / Loop Recorder 

## 2023-01-10 ENCOUNTER — Other Ambulatory Visit: Payer: Self-pay | Admitting: Internal Medicine

## 2023-01-11 ENCOUNTER — Encounter: Payer: Self-pay | Admitting: Physician Assistant

## 2023-01-11 ENCOUNTER — Ambulatory Visit: Payer: Medicare HMO | Admitting: Physician Assistant

## 2023-01-11 VITALS — BP 125/62 | HR 60 | Resp 18 | Ht 66.0 in | Wt 196.0 lb

## 2023-01-11 DIAGNOSIS — R296 Repeated falls: Secondary | ICD-10-CM | POA: Diagnosis not present

## 2023-01-11 NOTE — Progress Notes (Addendum)
Neurology clinic note  SERVICE DATE: 01/11/23   Reason for Evaluation:    HPI: This is [name], a 89 y.o. right-handed male with extensive medical history listed below who presents to neurology clinic with the chief complaint of breathing history of falls from 6/10-6 1824.  The patient is here alone  In review, he reports infrequent, self correcting atrial fibrillation.  His diabetes is well-controlled by medications.  On 11/23/2022, the patient saw Dr. Lawerance Bach in her office for follow-up.  He had normal blood work, at the time fluoxetine has been increased.  Dr. Lawerance Bach recommended increasing exercise.  The following day, the patient reported his brain "was slowed down ", and was having a hard time trying to understand what the TV was reporting.  He stated that the brain is not processing fast enough.  He began noticing that his walking was unsteady. On 6/13, he went for a walk, unsteady, "having to concentrate on each step, to make sure where the food landed ".  "My walking became a rapid hoping only 2 to 4 inches forward and after 2 or 3 feet, before became obvious ".  "Larey Seat largely face first with some twisting to the right side to get off the sidewalk "during that episode, the patient feels that had either complications of atrial fibrillation versus TIA versus other abnormalities.  He did not seek medical care at the hospital.  Security came to see him.  They told him to remain on the ground until the nurse got there.  "The nurse arrived within 5 minutes, and to the vital signs, which indicated considerable variation of the heart rates between the times when she took the vitals ".  "Security officers helped me to stand, sat in the car for a few minutes, and they assisted me to the apartment "-he says.  " heart monitor or her previous 60 days indicated no atrial fibrillation "..  (Loop recorder was negative), with later episode around his apartment while he was picking up a scrap of paper from  the floor but could not stand back up.  He called Security, and the officers assisted him to stand.  On 614, his daughter was worried about what it might come next, she told him to go to the ER, but patient did not want to.  Since 12/01/2022, "it is all okay ".  He denies any new events similar to those.   Other ROS:  Muscle bulk loss? No  Cramps/Twitching? Slight  Does strength improve after brief exercise?some.  He has not been doing physical therapy much.   Able to button shirts/use zips? Yes   Clumsiness/dropping grasped objects?sometimes "I catch it before I drop it " Can you arise from sitting  position easily? Yes, not from the floor as mentioned above      Able to walk up steps easily? No  Use an assistive device to walk? No   Significant imbalance with walking? No   Falls?  "1/2 dozen falls ", some of them hitting his head in one episode 1 year ago he had a LOC  More drooling than before for several months  No dysphonia or dysarthria  Changes in hearing "the pitch is different" Denies impaired mastication, facial weakness/droop. Denies  orthopnea>dyspnea or CP   No vision changes except fine print   Denies impaired sweating, heat or cold intolerance,  early satiety, postprandial abdominal bloating, Intermittent diarrhea  History of bladder spasms on Gymteza   Denies paresthesias   ETOH  use: Sometimes when I cannot sleep, 1-2 times a week 2 shots of vodka or bourbon   Restrictive diet? No  Family history of neuropathy/myopathy/NM disease?No       MEDICATIONS:  Outpatient Encounter Medications as of 01/11/2023  Medication Sig Note   acetaminophen (TYLENOL) 650 MG CR tablet Take 650 mg by mouth in the morning and at bedtime.    alendronate (FOSAMAX) 70 MG tablet     allopurinol (ZYLOPRIM) 300 MG tablet TAKE 1 TABLET (300 MG TOTAL) BY MOUTH EVERY EVENING. FOR GOUT    amiodarone (PACERONE) 200 MG tablet TAKE 1 TABLET EVERY DAY    amLODipine (NORVASC) 5 MG tablet TAKE 1 TABLET (5  MG TOTAL) BY MOUTH DAILY.    atorvastatin (LIPITOR) 20 MG tablet TAKE 1 TABLET BY MOUTH EVERY DAY FOR CHOLESTEROL    Calcium Citrate-Vitamin D (CALCIUM + D PO) Take 1 tablet by mouth daily.    carboxymethylcellulose (REFRESH PLUS) 0.5 % SOLN Place 1 drop into both eyes 3 (three) times daily as needed (dry eyes).    dapagliflozin propanediol (FARXIGA) 10 MG TABS tablet TAKE 1 TABLET (10 MG TOTAL) BY MOUTH DAILY BEFORE BREAKFAST. FOR DIABETES    Emollient (RA RENEWAL DARK SPOT CORRECTOR EX) Apply 1 application topically daily as needed (dark spots).    FLUoxetine (PROZAC) 40 MG capsule Take 1 capsule (40 mg total) by mouth daily.    Fluticasone Propionate (ALLERGY RELIEF NA) Place 2 sprays into the nose daily as needed (allergies).    furosemide (LASIX) 40 MG tablet     hydrochlorothiazide (HYDRODIURIL) 12.5 MG tablet TAKE 1 TABLET BY MOUTH EVERY DAY    Lancets MISC Use to check blood sugars daily. E11.9    latanoprost (XALATAN) 0.005 % ophthalmic solution Place 1 drop into both eyes at bedtime.    levothyroxine (SYNTHROID) 150 MCG tablet TAKE 1 TABLET BY MOUTH EVERY DAY    Lido-Menthol-Methyl Sal-Camph (CBD KINGS EX) Apply 1 Application topically as needed (pain).    Melatonin 5 MG TABS Take 5 mg by mouth at bedtime.    mirabegron ER (MYRBETRIQ) 25 MG TB24 tablet     Multiple Vitamin (MULTIVITAMIN) tablet Take 1 tablet by mouth daily.    nitroGLYCERIN (NITROSTAT) 0.4 MG SL tablet Place 1 tablet (0.4 mg total) under the tongue every 5 (five) minutes as needed for chest pain.    omeprazole (PRILOSEC) 20 MG capsule TAKE 1 CAPSULE BY MOUTH EVERY DAY 10/16/2022: On hold   oxyCODONE-acetaminophen (PERCOCET) 10-325 MG tablet Take 1 tablet by mouth every 8 (eight) hours as needed for pain.    pregabalin (LYRICA) 75 MG capsule Take 1 capsule (75 mg total) by mouth 2 (two) times daily.    Probiotic Product (PROBIOTIC PO) Take 1 capsule by mouth daily.    RESTASIS 0.05 % ophthalmic emulsion Place 1 drop into  both eyes 2 (two) times daily.     Semaglutide (RYBELSUS) 7 MG TABS Take 7 mg by mouth daily. Via [employer] pt assistance 09/08/2022: Getting from his physician's office.   sildenafil (REVATIO) 20 MG tablet Take 40-100 mg by mouth daily as needed. 09/08/2022: No dispense records in past 12 months   silodosin (RAPAFLO) 8 MG CAPS capsule Take 8 mg by mouth daily.    telmisartan (MICARDIS) 40 MG tablet Take 1 tablet (40 mg total) by mouth daily.    Vibegron (GEMTESA) 75 MG TABS Take 75 mg by mouth daily. 09/08/2022: Getting from his physician's office.   vitamin C (ASCORBIC  ACID) 500 MG tablet Take 500 mg by mouth daily.    VITAMIN D, CHOLECALCIFEROL, PO Take 1 capsule by mouth daily.    No facility-administered encounter medications on file as of 01/11/2023.    PAST MEDICAL HISTORY: Past Medical History:  Diagnosis Date   Abdominal aortic atherosclerosis 02/11/2022   Acute encephalopathy 05/08/2019   Allergic rhinitis, seasonal 12/04/2011   Aortic atherosclerosis 02/04/2021   Arthritis of finger of both hands 03/27/2021   Atrial fibrillation (HCC)    BPH (benign prostatic hyperplasia)    Cataract    Chronic anticoagulation 12/21/2017   CHADS VASC=4, Eliquis stopped Sept 2021- recurrent falls with SDH   Chronic pain, legs and back 03/10/2017   Preferred pain management Leg and back pain   Compression fracture of thoracic vertebra 09/21/2017   COPD (chronic obstructive pulmonary disease)    2 liters O2 HS   Degeneration of thoracic intervertebral disc 09/21/2017   Degenerative joint disease of hand 07/14/2017   Dupuytren's contracture 07/12/2019   Essential hypertension 12/04/2011   Fatty liver 12/29/2020   Fatty tumor    waste and back   Fibromyalgia    Generalized anxiety disorder 06/12/2020   12/21 He is reporting severe insomnia and anxiety symptoms.  Options are limited.  We will try a low-dose lorazepam at night and during the day for anxiety and panic attacks.  He will stop  lorazepam if problems.  He will stop drinking alcohol.  Discontinue BuSpar.  Reduce Cymbalta to 1 a day.   GERD (gastroesophageal reflux disease)    Glaucoma 03/08/2016   Gout 03/08/2016   Hereditary and idiopathic peripheral neuropathy 08/28/2015   History of COVID-19 05/08/2019   2021 Post-COVID sx's -" brain fog" Try Lion's mane supplement and B complex with niacin for neuropathy   Hyperlipidemia 03/10/2017   Hypothyroidism    Hypoxia    Insomnia    Left-sided sensorineural hearing loss 07/12/2018   Lumbar spondylosis 02/11/2022   Major depressive disorder 09/07/2016   Mild cognitive impairment of uncertain or unknown etiology 12/27/2020   Obesity (BMI 30.0-34.9) 12/08/2016   Obstructive sleep apnea    Osteoarthritis    Osteoporosis    Pain in joint of right ankle 09/15/2021   Persistent atrial fibrillation    on Eliquis   Recurrent falls 03/25/2020   PT. Treat neuropathy, insomnia.  Reduce Cymbalta to 1 a day.  Discontinue BuSpar.   Rib pain on left side 07/25/2021   Rotator cuff arthropathy of left shoulder 07/21/2019   SDH (subdural hematoma) 03/21/2020   Recurrent SDH secondary to falls at home- Sept 2019 and again 03/14/2020- Eliquis stopped.   Sebaceous cyst 06/27/2019   Spinal compression fracture seventh vertebre   Spondylolisthesis 09/07/2016   On fosamax - for about one year - Dr Ronne Binning  10/14/17 dexa: normal dexa -- spine 2.1,   RFN -0.9,  LFN   -0.7   - no comparison on file, previous fracture      Thoracic back pain 08/10/2017   Thrombocytopenia 12/20/2020   TIA (transient ischemic attack) 08/28/2015   Type 2 diabetes mellitus 03/08/2016    PAST SURGICAL HISTORY: Past Surgical History:  Procedure Laterality Date   APPENDECTOMY  age 64   CARPAL TUNNEL RELEASE Right    early 2000s   CATARACT EXTRACTION     bilateral   CHOLECYSTECTOMY  age 41   EYE SURGERY     FOOT ARTHRODESIS Right 02/02/2013   Procedure: RIGHT HALLUX METATARSAL PHALANGEAL JOINT  ARTHRODESIS ;  Surgeon: Toni Arthurs, MD;  Location: [facility];  Service: Orthopedics;  Laterality: Right;   INCISION AND DRAINAGE ABSCESS Left 09/03/2022   Procedure: INCISION AND DRAINAGE LEFT FACIAL  ABSCESS;  Surgeon: Scarlette Ar, MD;  Location: [facility] OR;  Service: ENT;  Laterality: Left;   INCISION AND DRAINAGE ABSCESS Left 09/07/2022   Procedure: IRRIGATION AND DEBRIDMENT OF LEFT FACIAL ABSCESS;  Surgeon: Scarlette Ar, MD;  Location: [facility] OR;  Service: ENT;  Laterality: Left;   INGUINAL HERNIA REPAIR  age 34   rt side   NASAL CONCHA BULLOSA RESECTION  age 51   PROSTATE SURGERY     SHOULDER ARTHROSCOPY W/ ROTATOR CUFF REPAIR Right    early 2000s   tonsil     VASECTOMY  age 46    ALLERGIES: Allergies  Allergen Reactions   Dust Mite Extract Shortness Of Breath   Other Other (See Comments)    DUST- Respiratory distress    FAMILY HISTORY: Family History  Problem Relation Age of Onset   Coronary artery disease Brother    Heart disease Father    Lung cancer Father    Kidney cancer Father    Prostate cancer Father    Arthritis Mother    Lung cancer Mother    Dementia Mother        Unspecified type, not Alzheimer's disease    SOCIAL HISTORY: Social History   Tobacco Use   Smoking status: Former    Current packs/day: 0.00    Average packs/day: 2.0 packs/day for 10.0 years (20.0 ttl pk-yrs)    Types: Cigarettes    Start date: 07/17/1965    Quit date: 07/18/1975    Years since quitting: 47.5    Passive exposure: Past   Smokeless tobacco: Never  Vaping Use   Vaping status: Never Used  Substance Use Topics   Alcohol use: Not Currently    Comment: one - two drinks/week   Drug use: No   Social History   Social History Narrative   Right handed   Master degree   Drinks caffeine retired   retired     OBJECTIVE: PHYSICAL EXAM: BP 125/62   Pulse 60   Resp 18   Ht 5\' 6"  (1.676 m)   Wt 196 lb (88.9 kg)   SpO2 95%   BMI 31.64 kg/m    General: General appearance: Awake and alert. No distress. Cooperative with exam.  Skin: No obvious rash or jaundice. HEENT: Atraumatic. Anicteric. Lungs: Non-labored breathing on room air  Heart: Regular Abdomen: Soft, non tender. Extremities: No edema. No obvious deformity.  Musculoskeletal: No obvious joint swelling. Psych: Affect appropriate.  Neurological: Mental Status: Alert. Speech fluent. No pseudobulbar affect Cranial Nerves: CNII: No RAPD. Visual fields grossly intact. CNIII, IV, VI: PERRL. No nystagmus. EOMI. CN V: Facial sensation intact bilaterally to fine touch.   CN VII: Facial muscles symmetric and strong. No ptosis  CN VIII: Hearing grossly intact bilaterally. CN IX: No hypophonia. CN X: Palate elevates symmetrically. CN XI: Full strength shoulder shrug bilaterally. CN XII: Tongue protrusion full and midline. No atrophy or fasciculations. No significant dysarthria Motor: Tone is normal. No fasciculations     No atrophy.  Normal lower extremity movement, he may lean to the right side as before.       Pathological Reflexes: Babinski: Negative response bilaterally  Sensation: Pinprick: Normal Vibration normal Proprioception: Normal Coordination: Intact finger-to- nose-finger bilaterally. Romberg unable to be tested gait: Able to rise from chair with arms crossed  unassisted. Normal, narrow-based gait. Able to tandem walk.     ASSESSMENT: Ka Polishchuk is a 89 y.o. male who presents for evaluation of recent and stable walk with falls, with resolution of symptoms since June 18,2024  He has extensive cardiac history, and in the past he did have falls.  It is unclear if these symptoms are related to his history of atrial fibrillation, TIA, etc. but in view of these prior abnormal MRI of the brain and other vascular studies we will repeat the studies.Marland Kitchen    PLAN: MRI of the brain to further evaluate for structural abnormalities and vascular load, MRA of the  head and neck to further evaluate his vascular structure The patient was counseled on pertinent fall precautions Follow-up in 6 weeks  Recommend good control of cardiovascular risk factors.   He was instructed to go to the ER if symptoms recur.   Total time spent reviewing records, interview, history/exam, documentation, and coordination of care on day of encounter:  56 mins        CC: Pincus Sanes, MD [address] [place] [address]

## 2023-01-11 NOTE — Patient Instructions (Addendum)
MRI brain MRA head and neck  Will wait for the results, pending on them will proceed with further instruction  Recommend good control of cardiovascular risk factors.  (Afib) Consider using cane for stability  If symptoms are severe ER ! Return in 6 weeks  Tests to be done at [facility]

## 2023-01-14 ENCOUNTER — Telehealth: Payer: Self-pay

## 2023-01-14 DIAGNOSIS — J449 Chronic obstructive pulmonary disease, unspecified: Secondary | ICD-10-CM | POA: Diagnosis not present

## 2023-01-14 NOTE — Telephone Encounter (Signed)
Left message today for patient.  Rybelsus received today and left up front for pick up.

## 2023-01-15 DIAGNOSIS — R351 Nocturia: Secondary | ICD-10-CM | POA: Diagnosis not present

## 2023-01-15 DIAGNOSIS — R350 Frequency of micturition: Secondary | ICD-10-CM | POA: Diagnosis not present

## 2023-01-15 DIAGNOSIS — N401 Enlarged prostate with lower urinary tract symptoms: Secondary | ICD-10-CM | POA: Diagnosis not present

## 2023-01-18 ENCOUNTER — Ambulatory Visit (INDEPENDENT_AMBULATORY_CARE_PROVIDER_SITE_OTHER): Payer: Medicare HMO

## 2023-01-18 DIAGNOSIS — R55 Syncope and collapse: Secondary | ICD-10-CM | POA: Diagnosis not present

## 2023-01-22 DIAGNOSIS — M47814 Spondylosis without myelopathy or radiculopathy, thoracic region: Secondary | ICD-10-CM | POA: Diagnosis not present

## 2023-01-26 DIAGNOSIS — H401131 Primary open-angle glaucoma, bilateral, mild stage: Secondary | ICD-10-CM | POA: Diagnosis not present

## 2023-01-26 DIAGNOSIS — H04123 Dry eye syndrome of bilateral lacrimal glands: Secondary | ICD-10-CM | POA: Diagnosis not present

## 2023-01-28 DIAGNOSIS — H903 Sensorineural hearing loss, bilateral: Secondary | ICD-10-CM | POA: Diagnosis not present

## 2023-01-29 NOTE — Progress Notes (Signed)
Carelink Summary Report / Loop Recorder 

## 2023-02-14 DIAGNOSIS — J449 Chronic obstructive pulmonary disease, unspecified: Secondary | ICD-10-CM | POA: Diagnosis not present

## 2023-02-16 ENCOUNTER — Inpatient Hospital Stay: Payer: Medicare HMO | Attending: Oncology

## 2023-02-16 ENCOUNTER — Inpatient Hospital Stay (HOSPITAL_BASED_OUTPATIENT_CLINIC_OR_DEPARTMENT_OTHER): Payer: Medicare HMO | Admitting: Oncology

## 2023-02-16 VITALS — BP 114/58 | HR 58 | Temp 98.2°F | Resp 18 | Ht 66.0 in | Wt 195.3 lb

## 2023-02-16 DIAGNOSIS — D696 Thrombocytopenia, unspecified: Secondary | ICD-10-CM | POA: Insufficient documentation

## 2023-02-16 LAB — CBC WITH DIFFERENTIAL (CANCER CENTER ONLY)
Abs Immature Granulocytes: 0.2 10*3/uL — ABNORMAL HIGH (ref 0.00–0.07)
Basophils Absolute: 0 10*3/uL (ref 0.0–0.1)
Basophils Relative: 0 %
Eosinophils Absolute: 0 10*3/uL (ref 0.0–0.5)
Eosinophils Relative: 0 %
HCT: 37.5 % — ABNORMAL LOW (ref 39.0–52.0)
Hemoglobin: 11.9 g/dL — ABNORMAL LOW (ref 13.0–17.0)
Lymphocytes Relative: 14 %
Lymphs Abs: 1.2 10*3/uL (ref 0.7–4.0)
MCH: 28.1 pg (ref 26.0–34.0)
MCHC: 31.7 g/dL (ref 30.0–36.0)
MCV: 88.4 fL (ref 80.0–100.0)
Monocytes Absolute: 3.4 10*3/uL — ABNORMAL HIGH (ref 0.1–1.0)
Monocytes Relative: 39 %
Myelocytes: 2 %
Neutro Abs: 3.9 10*3/uL (ref 1.7–7.7)
Neutrophils Relative %: 45 %
Platelet Count: 62 10*3/uL — ABNORMAL LOW (ref 150–400)
RBC: 4.24 MIL/uL (ref 4.22–5.81)
RDW: 16.6 % — ABNORMAL HIGH (ref 11.5–15.5)
WBC Count: 8.6 10*3/uL (ref 4.0–10.5)
nRBC: 0 % (ref 0.0–0.2)

## 2023-02-16 LAB — FERRITIN: Ferritin: 52 ng/mL (ref 24–336)

## 2023-02-16 NOTE — Progress Notes (Signed)
  [facility] OFFICE PROGRESS NOTE   Diagnosis: Thrombocytopenia  INTERVAL HISTORY:   Jonathan [name] returns as scheduled.  He has intermittent falls and syncope events.  No bleeding other than easy bruising.  No fever.  Good appetite.  Objective:  Vital signs in last 24 hours:  Blood pressure (!) 114/58, pulse (!) 58, temperature 98.2 F (36.8 C), temperature source Oral, resp. rate 18, height 5\' 6"  (1.676 m), weight 195 lb 4.8 oz (88.6 kg), SpO2 93%.     Lymphatics: No cervical, supraclavicular, axillary, or inguinal nodes Resp: Distant breath sounds, inspiratory rales/rhonchi at the lower posterior chest bilaterally, no respiratory distress Cardio: Regular rate and rhythm GI: Mildly distended, no hepatosplenomegaly, mild venous engorgement of the abdominal wall Vascular: Trace edema to left lower leg  Skin: Ecchymoses at the dorsum of the left greater than right forearm  Portacath/PICC-without erythema Lab Results:  Lab Results  Component Value Date   WBC 8.6 02/16/2023   HGB 11.9 (L) 02/16/2023   HCT 37.5 (L) 02/16/2023   MCV 88.4 02/16/2023   PLT 62 (L) 02/16/2023   NEUTROABS 3.9 02/16/2023    CMP  Lab Results  Component Value Date   NA 141 11/19/2022   K 3.7 11/19/2022   CL 104 11/19/2022   CO2 30 11/19/2022   GLUCOSE 97 11/19/2022   BUN 18 11/19/2022   CREATININE 1.20 11/19/2022   CALCIUM 9.0 11/19/2022   PROT 6.1 11/19/2022   ALBUMIN 4.2 11/19/2022   AST 27 11/19/2022   ALT 35 11/19/2022   ALKPHOS 88 11/19/2022   BILITOT 0.5 11/19/2022   GFRNONAA >60 09/09/2022   GFRAA >60 03/14/2020     Medications: I have reviewed the patient's current medications.   Assessment/Plan: Thrombocytopenia-chronic   Atrial fibrillation-apixaban discontinued May 2023 due to recurrent falls/syncope Hypertension Fibromyalgia Diabetes Hypothyroidism Anxiety Gout Neuropathy Sleep apnea-CPAP Glaucoma Mild elevation of monocyte count Small cutaneous  cyst at the perineum Left facial abscess, status post I&D March 2024 Mild normocytic anemia-chronic      Disposition: Jonathan. [name] has chronic mild anemia and moderate thrombocytopenia.  The hematologic findings could be related to myelodysplasia or chronic liver disease.  The differential diagnosis includes a low-grade hematopoietic malignancy.  A myeloma panel was negative in 2023.  He will call for spontaneous bleeding or bruising.  He will return for an office visit in 6 months.  We will perform further diagnostic evaluation for liver disease and a bone marrow biopsy if he develops a progressive hematologic abnormality.  Thornton Papas, MD  02/16/2023  11:58 AM

## 2023-02-17 ENCOUNTER — Telehealth: Payer: Self-pay | Admitting: Physician Assistant

## 2023-02-17 LAB — CUP PACEART REMOTE DEVICE CHECK
Date Time Interrogation Session: 20240903093155
Implantable Pulse Generator Implant Date: 20230612
Zone Setting Status: 755011
Zone Setting Status: 755011
Zone Setting Status: 755011
Zone Setting Status: 755011

## 2023-02-17 NOTE — Telephone Encounter (Signed)
Pt is calling in wanting to have clarification on the MRI/MRA he called [facility] and they told him it was for the neck and he thought it was going to be for the brain.  Pt would like to have a call back to let him know.

## 2023-02-17 NOTE — Telephone Encounter (Signed)
Patient is scheduled is scheduled for mra of head and neck. Left detailed message.

## 2023-02-22 ENCOUNTER — Ambulatory Visit (INDEPENDENT_AMBULATORY_CARE_PROVIDER_SITE_OTHER): Payer: Medicare HMO

## 2023-02-22 DIAGNOSIS — R55 Syncope and collapse: Secondary | ICD-10-CM | POA: Diagnosis not present

## 2023-02-25 ENCOUNTER — Encounter: Payer: Self-pay | Admitting: Physician Assistant

## 2023-03-03 ENCOUNTER — Encounter: Payer: Self-pay | Admitting: Physician Assistant

## 2023-03-05 ENCOUNTER — Other Ambulatory Visit: Payer: Medicare HMO

## 2023-03-05 ENCOUNTER — Inpatient Hospital Stay: Admission: RE | Admit: 2023-03-05 | Payer: Medicare HMO | Source: Ambulatory Visit

## 2023-03-10 ENCOUNTER — Ambulatory Visit: Payer: Medicare HMO | Admitting: Physician Assistant

## 2023-03-10 NOTE — Progress Notes (Signed)
Carelink Summary Report / Loop Recorder 

## 2023-03-10 NOTE — Progress Notes (Deleted)
Neurology clinic note Progress note  SERVICE DATE: 01/11/23   Reason for Evaluation:    HPI: This is Jonathan [name], a 89 y.o. right-handed male with extensive medical history listed below who presents to neurology clinic with the chief complaint of gait instability  The patient is here alone.    Prior visit July 2024  In review, he reports infrequent, self correcting atrial fibrillation.  His diabetes is well-controlled by medications.  On 11/23/2022, the patient saw Dr. Lawerance [name] in her office for follow-up.  He had normal blood work, at the time fluoxetine has been increased.  Dr. Lawerance [name] recommended increasing exercise.  The following day, the patient reported his brain "was slowed down ", and was having a hard time trying to understand what the TV was reporting.  He stated that the brain is not processing fast enough.  He began noticing that his walking was unsteady. On 6/13, he went for a walk, unsteady, "having to concentrate on each step, to make sure where the food landed ".  "My walking became a rapid hoping only 2 to 4 inches forward and after 2 or 3 feet, F4 became obvious ".  "Larey Seat largely face first with some twisting to the right side to get off the sidewalk "during that episode, the patient feels that had either complications of atrial fibrillation versus TIA versus other abnormalities.  He did not seek medical care at the hospital.  Security came to see him.  They told him to remain on the ground until the nurse got there.  "The nurse arrived within 5 minutes, and to the vital signs, which indicated considerable variation of the heart rates between the times when she took the vitals ".  "Security officers helped me to stand, sat in the car for a few minutes, and they assisted me to the apartment "-he says.  " heart monitor or her previous 60 days indicated no atrial fibrillation "..  (Loop recorder was negative), with later episode around his apartment while he was picking up a scrap  of paper from the floor but could not stand back up.  He called Security, and the officers assisted him to stand.  On 614, his daughter was worried about what it might come next, she told him to go to the ER, but patient did not want to.  Since 12/01/2022, "it is all okay ".  He denies any new events similar to those.   Other ROS:  Muscle bulk loss? No  Cramps/Twitching? Slight  Does strength improve after brief exercise?some.  He has not been doing physical therapy much.   Able to button shirts/use zips? Yes   Clumsiness/dropping grasped objects?sometimes "I catch it before I drop it " Can you arise from sitting  position easily? Yes, not from the floor as mentioned above      Able to walk up steps easily? No  Use an assistive device to walk? No   Significant imbalance with walking? No   Falls?  "1/2 dozen falls ", some of them hitting his head in one episode 1 year ago he had a LOC  More drooling than before for several months  No dysphonia or dysarthria  Changes in hearing "the pitch is different" Denies impaired mastication, facial weakness/droop. Denies  orthopnea>dyspnea or CP   No vision changes except fine print   Denies impaired sweating, heat or cold intolerance,  early satiety, postprandial abdominal bloating, Intermittent diarrhea  History of bladder spasms on [place]   Denies  paresthesias   ETOH use: Sometimes when I cannot sleep, 1-2 times a week 2 shots of vodka or bourbon   Restrictive diet? No  Family history of neuropathy/myopathy/NM disease?No       MEDICATIONS:  Outpatient Encounter Medications as of 03/10/2023  Medication Sig Note   acetaminophen (TYLENOL) 650 MG CR tablet Take 650 mg by mouth in the morning and at bedtime. 02/16/2023: Takes daily   alendronate (FOSAMAX) 70 MG tablet  (Patient not taking: Reported on 02/16/2023)    allopurinol (ZYLOPRIM) 300 MG tablet TAKE 1 TABLET (300 MG TOTAL) BY MOUTH EVERY EVENING. FOR GOUT    amiodarone (PACERONE) 200 MG tablet  TAKE 1 TABLET EVERY DAY    amLODipine (NORVASC) 5 MG tablet TAKE 1 TABLET (5 MG TOTAL) BY MOUTH DAILY.    atorvastatin (LIPITOR) 20 MG tablet TAKE 1 TABLET BY MOUTH EVERY DAY FOR CHOLESTEROL    Calcium Citrate-Vitamin D (CALCIUM + D PO) Take 1 tablet by mouth daily.    carboxymethylcellulose (REFRESH PLUS) 0.5 % SOLN Place 1 drop into both eyes 3 (three) times daily as needed (dry eyes).    dapagliflozin propanediol (FARXIGA) 10 MG TABS tablet TAKE 1 TABLET (10 MG TOTAL) BY MOUTH DAILY BEFORE BREAKFAST. FOR DIABETES    Emollient (RA RENEWAL DARK SPOT CORRECTOR EX) Apply 1 application topically daily as needed (dark spots).    FLUoxetine (PROZAC) 40 MG capsule Take 1 capsule (40 mg total) by mouth daily.    Fluticasone Propionate (ALLERGY RELIEF NA) Place 2 sprays into the nose daily as needed (allergies).    furosemide (LASIX) 40 MG tablet Take 40 mg by mouth daily.    hydrochlorothiazide (HYDRODIURIL) 12.5 MG tablet TAKE 1 TABLET BY MOUTH EVERY DAY    Lancets MISC Use to check blood sugars daily. E11.9    latanoprost (XALATAN) 0.005 % ophthalmic solution Place 1 drop into both eyes at bedtime.    levothyroxine (SYNTHROID) 150 MCG tablet TAKE 1 TABLET BY MOUTH EVERY DAY    Lido-Menthol-Methyl Sal-Camph (CBD KINGS EX) Apply 1 Application topically as needed (pain). (Patient not taking: Reported on 02/16/2023)    Melatonin 5 MG TABS Take 5 mg by mouth at bedtime.    mirabegron ER (MYRBETRIQ) 25 MG TB24 tablet     Multiple Vitamin (MULTIVITAMIN) tablet Take 1 tablet by mouth daily.    nitroGLYCERIN (NITROSTAT) 0.4 MG SL tablet Place 1 tablet (0.4 mg total) under the tongue every 5 (five) minutes as needed for chest pain. (Patient not taking: Reported on 02/16/2023)    omeprazole (PRILOSEC) 20 MG capsule TAKE 1 CAPSULE BY MOUTH EVERY DAY    oxyCODONE-acetaminophen (PERCOCET) 10-325 MG tablet Take 1 tablet by mouth every 8 (eight) hours as needed for pain.    pregabalin (LYRICA) 75 MG capsule Take 1  capsule (75 mg total) by mouth 2 (two) times daily.    Probiotic Product (PROBIOTIC PO) Take 1 capsule by mouth daily.    RESTASIS 0.05 % ophthalmic emulsion Place 1 drop into both eyes 2 (two) times daily.     Semaglutide (RYBELSUS) 7 MG TABS Take 7 mg by mouth daily. Via [employer] pt assistance 09/08/2022: Getting from his physician's office.   sildenafil (REVATIO) 20 MG tablet Take 40-100 mg by mouth daily as needed. (Patient not taking: Reported on 02/16/2023) 09/08/2022: No dispense records in past 12 months   silodosin (RAPAFLO) 8 MG CAPS capsule Take 8 mg by mouth daily.    telmisartan (MICARDIS) 40 MG tablet Take  1 tablet (40 mg total) by mouth daily.    Vibegron (GEMTESA) 75 MG TABS Take 75 mg by mouth daily. 09/08/2022: Getting from his physician's office.   vitamin C (ASCORBIC ACID) 500 MG tablet Take 500 mg by mouth daily.    VITAMIN D, CHOLECALCIFEROL, PO Take 1 capsule by mouth daily.    No facility-administered encounter medications on file as of 03/10/2023.    PAST MEDICAL HISTORY: Past Medical History:  Diagnosis Date   Abdominal aortic atherosclerosis 02/11/2022   Acute encephalopathy 05/08/2019   Allergic rhinitis, seasonal 12/04/2011   Aortic atherosclerosis 02/04/2021   Arthritis of finger of both hands 03/27/2021   Atrial fibrillation (HCC)    BPH (benign prostatic hyperplasia)    Cataract    Chronic anticoagulation 12/21/2017   CHADS VASC=4, Eliquis stopped Sept 2021- recurrent falls with SDH   Chronic pain, legs and back 03/10/2017   Preferred pain management Leg and back pain   Compression fracture of thoracic vertebra 09/21/2017   COPD (chronic obstructive pulmonary disease)    2 liters O2 HS   Degeneration of thoracic intervertebral disc 09/21/2017   Degenerative joint disease of hand 07/14/2017   Dupuytren's contracture 07/12/2019   Essential hypertension 12/04/2011   Fatty liver 12/29/2020   Fatty tumor    waste and back   Fibromyalgia    Generalized  anxiety disorder 06/12/2020   12/21 He is reporting severe insomnia and anxiety symptoms.  Options are limited.  We will try a low-dose lorazepam at night and during the day for anxiety and panic attacks.  He will stop lorazepam if problems.  He will stop drinking alcohol.  Discontinue BuSpar.  Reduce Cymbalta to 1 a day.   GERD (gastroesophageal reflux disease)    Glaucoma 03/08/2016   Gout 03/08/2016   Hereditary and idiopathic peripheral neuropathy 08/28/2015   History of COVID-19 05/08/2019   2021 Post-COVID sx's -" brain fog" Try Lion's mane supplement and B complex with niacin for neuropathy   Hyperlipidemia 03/10/2017   Hypothyroidism    Hypoxia    Insomnia    Left-sided sensorineural hearing loss 07/12/2018   Lumbar spondylosis 02/11/2022   Major depressive disorder 09/07/2016   Mild cognitive impairment of uncertain or unknown etiology 12/27/2020   Obesity (BMI 30.0-34.9) 12/08/2016   Obstructive sleep apnea    Osteoarthritis    Osteoporosis    Pain in joint of right ankle 09/15/2021   Persistent atrial fibrillation    on Eliquis   Recurrent falls 03/25/2020   PT. Treat neuropathy, insomnia.  Reduce Cymbalta to 1 a day.  Discontinue BuSpar.   Rib pain on left side 07/25/2021   Rotator cuff arthropathy of left shoulder 07/21/2019   SDH (subdural hematoma) 03/21/2020   Recurrent SDH secondary to falls at home- Sept 2019 and again 03/14/2020- Eliquis stopped.   Sebaceous cyst 06/27/2019   Spinal compression fracture seventh vertebre   Spondylolisthesis 09/07/2016   On fosamax - for about one year - Dr Ronne Binning  10/14/17 dexa: normal dexa -- spine 2.1,   RFN -0.9,  LFN   -0.7   - no comparison on file, previous fracture      Thoracic back pain 08/10/2017   Thrombocytopenia 12/20/2020   TIA (transient ischemic attack) 08/28/2015   Type 2 diabetes mellitus 03/08/2016    PAST SURGICAL HISTORY: Past Surgical History:  Procedure Laterality Date   APPENDECTOMY  age 5   CARPAL  TUNNEL RELEASE Right    early 2000s   CATARACT EXTRACTION  bilateral   CHOLECYSTECTOMY  age 19   EYE SURGERY     FOOT ARTHRODESIS Right 02/02/2013   Procedure: RIGHT HALLUX METATARSAL PHALANGEAL JOINT ARTHRODESIS ;  Surgeon: Toni Arthurs, MD;  Location: [facility];  Service: Orthopedics;  Laterality: Right;   INCISION AND DRAINAGE ABSCESS Left 09/03/2022   Procedure: INCISION AND DRAINAGE LEFT FACIAL  ABSCESS;  Surgeon: Scarlette Ar, MD;  Location: [facility] OR;  Service: ENT;  Laterality: Left;   INCISION AND DRAINAGE ABSCESS Left 09/07/2022   Procedure: IRRIGATION AND DEBRIDMENT OF LEFT FACIAL ABSCESS;  Surgeon: Scarlette Ar, MD;  Location: [facility] OR;  Service: ENT;  Laterality: Left;   INGUINAL HERNIA REPAIR  age 65   rt side   NASAL CONCHA BULLOSA RESECTION  age 42   PROSTATE SURGERY     SHOULDER ARTHROSCOPY W/ ROTATOR CUFF REPAIR Right    early 2000s   tonsil     VASECTOMY  age 47    ALLERGIES: Allergies  Allergen Reactions   Dust Mite Extract Shortness Of Breath   Other Other (See Comments)    DUST- Respiratory distress    FAMILY HISTORY: Family History  Problem Relation Age of Onset   Coronary artery disease Brother    Heart disease Father    Lung cancer Father    Kidney cancer Father    Prostate cancer Father    Arthritis Mother    Lung cancer Mother    Dementia Mother        Unspecified type, not Alzheimer's disease    SOCIAL HISTORY: Social History   Tobacco Use   Smoking status: Former    Current packs/day: 0.00    Average packs/day: 2.0 packs/day for 10.0 years (20.0 ttl pk-yrs)    Types: Cigarettes    Start date: 07/17/1965    Quit date: 07/18/1975    Years since quitting: 47.6    Passive exposure: Past   Smokeless tobacco: Never  Vaping Use   Vaping status: Never Used  Substance Use Topics   Alcohol use: Not Currently    Comment: one - two drinks/week   Drug use: No   Social History   Social History Narrative   Right handed   Master  degree   Drinks caffeine retired   retired     OBJECTIVE: PHYSICAL EXAM: There were no vitals taken for this visit.  General: General appearance: Awake and alert. No distress. Cooperative with exam.  Skin: No obvious rash or jaundice. HEENT: Atraumatic. Anicteric. Lungs: Non-labored breathing on room air  Heart: Regular Abdomen: Soft, non tender. Extremities: No edema. No obvious deformity.  Musculoskeletal: No obvious joint swelling. Psych: Affect appropriate.  Neurological: Mental Status: Alert. Speech fluent. No pseudobulbar affect Cranial Nerves: CNII: No RAPD. Visual fields grossly intact. CNIII, IV, VI: PERRL. No nystagmus. EOMI. CN V: Facial sensation intact bilaterally to fine touch.   CN VII: Facial muscles symmetric and strong. No ptosis  CN VIII: Hearing grossly intact bilaterally. CN IX: No hypophonia. CN X: Palate elevates symmetrically. CN XI: Full strength shoulder shrug bilaterally. CN XII: Tongue protrusion full and midline. No atrophy or fasciculations. No significant dysarthria Motor: Tone is normal. No fasciculations     No atrophy.  Normal lower extremity movement, he may lean to the right side as before.       Pathological Reflexes: Babinski: Negative response bilaterally  Sensation: Pinprick: Normal Vibration normal Proprioception: Normal Coordination: Intact finger-to- nose-finger bilaterally. Romberg unable to be tested gait: Able to rise from  chair with arms crossed unassisted. Normal, narrow-based gait. Able to tandem walk.     ASSESSMENT: Jonathan [name] is a 89 y.o. male who presents for evaluation of recent and stable walk with falls, with resolution of symptoms since June 1824.  He has extensive cardiac history, and in the past he did have falls.  It is unclear if these symptoms are related to his history of atrial fibrillation, TIA, etc. but in view of these prior abnormal MRI of the brain and other vascular studies we will repeat  the studies.Marland Kitchen    PLAN: MRI of the brain to further evaluate for structural abnormalities and vascular load, MRI of the head and neck to further evaluate his vascular structure The patient was counseled about  fall precautions Follow-up in 6 months  Recommend good control of cardiovascular risk factors.   He was instructed to go to the ER if symptoms recur.   Total time spent reviewing records, interview, history/exam, documentation, and coordination of care on day of encounter:  *** mins        CC: Pincus Sanes, MD [address] [place] [address]

## 2023-03-12 ENCOUNTER — Ambulatory Visit: Payer: Medicare HMO | Admitting: Physician Assistant

## 2023-03-12 ENCOUNTER — Other Ambulatory Visit: Payer: Self-pay | Admitting: Internal Medicine

## 2023-03-14 DIAGNOSIS — M47896 Other spondylosis, lumbar region: Secondary | ICD-10-CM | POA: Diagnosis not present

## 2023-03-14 DIAGNOSIS — M47816 Spondylosis without myelopathy or radiculopathy, lumbar region: Secondary | ICD-10-CM | POA: Diagnosis not present

## 2023-03-16 DIAGNOSIS — J449 Chronic obstructive pulmonary disease, unspecified: Secondary | ICD-10-CM | POA: Diagnosis not present

## 2023-03-17 ENCOUNTER — Encounter: Payer: Self-pay | Admitting: Internal Medicine

## 2023-03-24 ENCOUNTER — Ambulatory Visit
Admission: RE | Admit: 2023-03-24 | Discharge: 2023-03-24 | Disposition: A | Discharge status: Home / Self Care | Payer: Medicare HMO | Source: Ambulatory Visit | Attending: Physician Assistant | Admitting: Physician Assistant

## 2023-03-24 MED ORDER — GADOPICLENOL 0.5 MMOL/ML IV SOLN
9.0000 mL | Freq: Once | INTRAVENOUS | Status: AC | PRN
  Administered 2023-03-24: 9 mL via INTRAVENOUS

## 2023-03-26 ENCOUNTER — Other Ambulatory Visit: Payer: Self-pay | Admitting: Internal Medicine

## 2023-03-29 ENCOUNTER — Ambulatory Visit (INDEPENDENT_AMBULATORY_CARE_PROVIDER_SITE_OTHER): Payer: Medicare HMO

## 2023-03-29 DIAGNOSIS — R55 Syncope and collapse: Secondary | ICD-10-CM | POA: Diagnosis not present

## 2023-03-30 LAB — CUP PACEART REMOTE DEVICE CHECK
Date Time Interrogation Session: 20241013231557
Implantable Pulse Generator Implant Date: 20230612

## 2023-04-07 DIAGNOSIS — L821 Other seborrheic keratosis: Secondary | ICD-10-CM | POA: Diagnosis not present

## 2023-04-07 DIAGNOSIS — D225 Melanocytic nevi of trunk: Secondary | ICD-10-CM | POA: Diagnosis not present

## 2023-04-07 DIAGNOSIS — D485 Neoplasm of uncertain behavior of skin: Secondary | ICD-10-CM | POA: Diagnosis not present

## 2023-04-07 DIAGNOSIS — Z1283 Encounter for screening for malignant neoplasm of skin: Secondary | ICD-10-CM | POA: Diagnosis not present

## 2023-04-13 NOTE — Progress Notes (Signed)
Carelink Summary Report / Loop Recorder 

## 2023-04-16 DIAGNOSIS — M47896 Other spondylosis, lumbar region: Secondary | ICD-10-CM | POA: Diagnosis not present

## 2023-04-16 DIAGNOSIS — J449 Chronic obstructive pulmonary disease, unspecified: Secondary | ICD-10-CM | POA: Diagnosis not present

## 2023-04-16 DIAGNOSIS — M47816 Spondylosis without myelopathy or radiculopathy, lumbar region: Secondary | ICD-10-CM | POA: Diagnosis not present

## 2023-04-20 DIAGNOSIS — N401 Enlarged prostate with lower urinary tract symptoms: Secondary | ICD-10-CM | POA: Diagnosis not present

## 2023-04-20 DIAGNOSIS — R351 Nocturia: Secondary | ICD-10-CM | POA: Diagnosis not present

## 2023-04-21 DIAGNOSIS — I10 Essential (primary) hypertension: Secondary | ICD-10-CM | POA: Diagnosis not present

## 2023-04-21 DIAGNOSIS — R0902 Hypoxemia: Secondary | ICD-10-CM | POA: Diagnosis not present

## 2023-04-21 DIAGNOSIS — G4733 Obstructive sleep apnea (adult) (pediatric): Secondary | ICD-10-CM | POA: Diagnosis not present

## 2023-04-23 NOTE — Progress Notes (Signed)
MRA head shows significant narrowing   on one of the  branches of the brain arteries, want to make a referral to  interventional radiology, Dr. Corliss Skains to take a closer look.   The rest the MRAs are unremarkable, thanks  @ [facility], would you pls make that referral, thanks in advance

## 2023-04-27 ENCOUNTER — Other Ambulatory Visit: Payer: Self-pay

## 2023-04-27 DIAGNOSIS — Z8679 Personal history of other diseases of the circulatory system: Secondary | ICD-10-CM

## 2023-04-27 NOTE — Progress Notes (Signed)
Call patient, informed results. Agrees consultation with Dr. Corliss Skains Interventional Radiology, thanks

## 2023-05-03 ENCOUNTER — Ambulatory Visit (INDEPENDENT_AMBULATORY_CARE_PROVIDER_SITE_OTHER): Payer: PPO

## 2023-05-03 DIAGNOSIS — R55 Syncope and collapse: Secondary | ICD-10-CM

## 2023-05-03 LAB — CUP PACEART REMOTE DEVICE CHECK
Date Time Interrogation Session: 20241117232438
Implantable Pulse Generator Implant Date: 20230612

## 2023-05-05 ENCOUNTER — Encounter: Payer: Self-pay | Admitting: Physician Assistant

## 2023-05-05 ENCOUNTER — Ambulatory Visit: Payer: Medicare HMO | Admitting: Physician Assistant

## 2023-05-05 VITALS — Resp 20 | Ht 66.0 in | Wt 195.0 lb

## 2023-05-05 DIAGNOSIS — G3184 Mild cognitive impairment, so stated: Secondary | ICD-10-CM | POA: Diagnosis not present

## 2023-05-05 DIAGNOSIS — R9389 Abnormal findings on diagnostic imaging of other specified body structures: Secondary | ICD-10-CM

## 2023-05-05 NOTE — Patient Instructions (Signed)
Repeat neuropsych for memory March 18 11:30  Follow up with Interventional radiology

## 2023-05-05 NOTE — Progress Notes (Signed)
Neurology clinic note  SERVICE DATE: 01/11/23    ASSESSMENT:  Morgan Mclane is a 89 y.o. male who presented initially for evaluation of unstable walk with falls, with resolution of symptoms since June , 2024   It is unclear if these symptoms are related to his history of atrial fibrillation, TIA, etc. 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  Based on these findings, discussed with the patient, I referred him to interventional radiology was placed for further evaluation.He is here today to discuss those results. He also complains of worsening memory issues, will revisit this in a future evaluation, in the interim, will set a repeat neuropsych evaluation for clarity of diagnosis. He was last tested in 01/2022 with a diagnosis of MCI of unclear etiology.  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  Recommend good control of cardiovascular risk factors, follow with Cards .   He was instructed to go to the ER if symptoms of stroke or severe falls  recur.  Repeat neuropsych evaluation for clarity of diagnosis and disease trajectory.       Prior visit July 2024   In review, he reports infrequent, self correcting atrial fibrillation.  His diabetes is well-controlled by medications.  On 11/23/2022, the patient saw Dr. Lawerance Bach in her office for follow-up.  He had normal blood work, at the time fluoxetine has been increased.  Dr. Lawerance Bach recommended increasing exercise.  The following day, the patient reported his brain "was slowed down ", and was having a hard time trying to understand what the TV was reporting.  He  stated that the brain is not processing fast enough.  He began noticing that his walking was unsteady. On 6/13, he went for a walk, unsteady, "having to concentrate on each step, to make sure where the food landed ".  "My walking became a rapid hoping only 2 to 4 inches forward and after 2 or 3 feet, before became obvious ".  "Larey Seat largely face first with some twisting to the right side to get off the sidewalk "during that episode, the patient feels that had either complications of atrial fibrillation versus TIA versus other abnormalities.  He did not seek medical care at the hospital.  Security came to see him.  They told him to remain on the ground until the nurse got there.  "The nurse arrived within 5 minutes, and to the vital signs, which indicated considerable variation of the heart rates between the times when she took the vitals ".  "Security officers helped me to stand, sat in the car for a few minutes, and they assisted me to the apartment "-he says.  " heart monitor or her previous 60 days indicated no atrial fibrillation "..  (Loop recorder was negative), with later episode around his apartment while he was picking up a scrap of paper from the floor but could not stand back up.  He called Security, and the officers assisted him to stand.  On 614, his daughter was worried about what it might come next, she told him to go to the ER, but patient did not want to.  Since 12/01/2022, "  it is all okay ".  He denies any new events similar to those.   Other ROS:  Muscle bulk loss? No  Cramps/Twitching? Slight  Does strength improve after brief exercise?some.  He has not been doing physical therapy much.   Able to button shirts/use zips? Yes   Clumsiness/dropping grasped objects?sometimes "I catch it before I drop it " Can you arise from sitting  position easily? Yes, not from the floor as mentioned above      Able to walk up steps easily? No  Use an assistive device to walk? No   Significant imbalance  with walking? No   Falls?  "1/2 dozen falls ", some of them hitting his head in one episode 1 year ago he had a LOC  More drooling than before for several months  No dysphonia or dysarthria  Changes in hearing "the pitch is different" Denies impaired mastication, facial weakness/droop. Denies  orthopnea>dyspnea or CP   No vision changes except fine print   Denies impaired sweating, heat or cold intolerance,  early satiety, postprandial abdominal bloating, Intermittent diarrhea  History of bladder spasms on Gymteza   Denies paresthesias   ETOH use: Sometimes when I cannot sleep, 1-2 times a week 2 shots of vodka or bourbon   Restrictive diet? No  Family history of neuropathy/myopathy/NM disease?No     MRI of the brain without contrast on 03/24/2023, personally reviewed, remarkable for stable findings when compared to the MRI performed on 09/04/2021, weighed and changed by 19 volume loss, without acute intracranial pathology.  MRA of the head and neck, personally reviewed 03/24/23 remarkable for severe stenosis of the proximal left M2 branches, increased since the study on 09/24/2021, and change mild to moderate stenosis of the distal left M1 segment and left P1/P2 junction, unchanged occlusion of the left the fourth segment after the PICA origin, and patent vasculature of the neck without hemodynamically significant stenosis or occlusion.  MEDICATIONS:  Outpatient Encounter Medications as of 05/05/2023  Medication Sig Note   acetaminophen (TYLENOL) 650 MG CR tablet Take 650 mg by mouth in the morning and at bedtime. 02/16/2023: Takes daily   allopurinol (ZYLOPRIM) 300 MG tablet TAKE 1 TABLET (300 MG TOTAL) BY MOUTH EVERY EVENING. FOR GOUT    amiodarone (PACERONE) 200 MG tablet TAKE 1 TABLET EVERY DAY    amLODipine (NORVASC) 5 MG tablet TAKE 1 TABLET (5 MG TOTAL) BY MOUTH DAILY.    atorvastatin (LIPITOR) 20 MG tablet TAKE 1 TABLET BY MOUTH EVERY DAY FOR CHOLESTEROL    Calcium Citrate-Vitamin D  (CALCIUM + D PO) Take 1 tablet by mouth daily.    carboxymethylcellulose (REFRESH PLUS) 0.5 % SOLN Place 1 drop into both eyes 3 (three) times daily as needed (dry eyes).    Emollient (RA RENEWAL DARK SPOT CORRECTOR EX) Apply 1 application topically daily as needed (dark spots).    FARXIGA 10 MG TABS tablet TAKE 1 TABLET (10 MG TOTAL) BY MOUTH DAILY BEFORE BREAKFAST. FOR DIABETES    FLUoxetine (PROZAC) 40 MG capsule Take 1 capsule (40 mg total) by mouth daily.    Fluticasone Propionate (ALLERGY RELIEF NA) Place 2 sprays into the nose daily as needed (allergies).    furosemide (LASIX) 40 MG tablet Take 40 mg by mouth daily.    hydrochlorothiazide (HYDRODIURIL) 12.5 MG tablet TAKE 1 TABLET BY MOUTH EVERY DAY    Lancets MISC Use to check blood sugars daily. E11.9    latanoprost (XALATAN) 0.005 % ophthalmic solution Place 1 drop  into both eyes at bedtime.    levothyroxine (SYNTHROID) 150 MCG tablet TAKE 1 TABLET BY MOUTH EVERY DAY    Lido-Menthol-Methyl Sal-Camph (CBD KINGS EX) Apply 1 Application topically as needed (pain).    Melatonin 5 MG TABS Take 5 mg by mouth at bedtime.    mirabegron ER (MYRBETRIQ) 25 MG TB24 tablet     Multiple Vitamin (MULTIVITAMIN) tablet Take 1 tablet by mouth daily.    nitroGLYCERIN (NITROSTAT) 0.4 MG SL tablet Place 1 tablet (0.4 mg total) under the tongue every 5 (five) minutes as needed for chest pain.    omeprazole (PRILOSEC) 20 MG capsule TAKE 1 CAPSULE BY MOUTH EVERY DAY    oxyCODONE-acetaminophen (PERCOCET) 10-325 MG tablet Take 1 tablet by mouth every 8 (eight) hours as needed for pain.    pregabalin (LYRICA) 75 MG capsule TAKE 1 CAPSULE BY MOUTH TWICE A DAY    Probiotic Product (PROBIOTIC PO) Take 1 capsule by mouth daily.    RESTASIS 0.05 % ophthalmic emulsion Place 1 drop into both eyes 2 (two) times daily.     Semaglutide (RYBELSUS) 7 MG TABS Take 7 mg by mouth daily. Via [employer] pt assistance 09/08/2022: Getting from his physician's office.   sildenafil  (REVATIO) 20 MG tablet Take 40-100 mg by mouth daily as needed. 09/08/2022: No dispense records in past 12 months   silodosin (RAPAFLO) 8 MG CAPS capsule Take 8 mg by mouth daily.    telmisartan (MICARDIS) 40 MG tablet Take 1 tablet (40 mg total) by mouth daily.    Vibegron (GEMTESA) 75 MG TABS Take 75 mg by mouth daily. 09/08/2022: Getting from his physician's office.   vitamin C (ASCORBIC ACID) 500 MG tablet Take 500 mg by mouth daily.    VITAMIN D, CHOLECALCIFEROL, PO Take 1 capsule by mouth daily.    alendronate (FOSAMAX) 70 MG tablet  (Patient not taking: Reported on 05/05/2023)    No facility-administered encounter medications on file as of 05/05/2023.    PAST MEDICAL HISTORY: Past Medical History:  Diagnosis Date   Abdominal aortic atherosclerosis 02/11/2022   Acute encephalopathy 05/08/2019   Allergic rhinitis, seasonal 12/04/2011   Aortic atherosclerosis 02/04/2021   Arthritis of finger of both hands 03/27/2021   Atrial fibrillation (HCC)    BPH (benign prostatic hyperplasia)    Cataract    Chronic anticoagulation 12/21/2017   CHADS VASC=4, Eliquis stopped Sept 2021- recurrent falls with SDH   Chronic pain, legs and back 03/10/2017   Preferred pain management Leg and back pain   Compression fracture of thoracic vertebra 09/21/2017   COPD (chronic obstructive pulmonary disease)    2 liters O2 HS   Degeneration of thoracic intervertebral disc 09/21/2017   Degenerative joint disease of hand 07/14/2017   Dupuytren's contracture 07/12/2019   Essential hypertension 12/04/2011   Fatty liver 12/29/2020   Fatty tumor    waste and back   Fibromyalgia    Generalized anxiety disorder 06/12/2020   12/21 He is reporting severe insomnia and anxiety symptoms.  Options are limited.  We will try a low-dose lorazepam at night and during the day for anxiety and panic attacks.  He will stop lorazepam if problems.  He will stop drinking alcohol.  Discontinue BuSpar.  Reduce Cymbalta to 1 a  day.   GERD (gastroesophageal reflux disease)    Glaucoma 03/08/2016   Gout 03/08/2016   Hereditary and idiopathic peripheral neuropathy 08/28/2015   History of COVID-19 05/08/2019   2021 Post-COVID sx's -" brain fog"  Try Lion's mane supplement and B complex with niacin for neuropathy   Hyperlipidemia 03/10/2017   Hypothyroidism    Hypoxia    Insomnia    Left-sided sensorineural hearing loss 07/12/2018   Lumbar spondylosis 02/11/2022   Major depressive disorder 09/07/2016   Mild cognitive impairment of uncertain or unknown etiology 12/27/2020   Obesity (BMI 30.0-34.9) 12/08/2016   Obstructive sleep apnea    Osteoarthritis    Osteoporosis    Pain in joint of right ankle 09/15/2021   Persistent atrial fibrillation    on Eliquis   Recurrent falls 03/25/2020   PT. Treat neuropathy, insomnia.  Reduce Cymbalta to 1 a day.  Discontinue BuSpar.   Rib pain on left side 07/25/2021   Rotator cuff arthropathy of left shoulder 07/21/2019   SDH (subdural hematoma) 03/21/2020   Recurrent SDH secondary to falls at home- Sept 2019 and again 03/14/2020- Eliquis stopped.   Sebaceous cyst 06/27/2019   Spinal compression fracture seventh vertebre   Spondylolisthesis 09/07/2016   On fosamax - for about one year - Dr Ronne Binning  10/14/17 dexa: normal dexa -- spine 2.1,   RFN -0.9,  LFN   -0.7   - no comparison on file, previous fracture      Thoracic back pain 08/10/2017   Thrombocytopenia 12/20/2020   TIA (transient ischemic attack) 08/28/2015   Type 2 diabetes mellitus 03/08/2016    PAST SURGICAL HISTORY: Past Surgical History:  Procedure Laterality Date   APPENDECTOMY  age 13   CARPAL TUNNEL RELEASE Right    early 2000s   CATARACT EXTRACTION     bilateral   CHOLECYSTECTOMY  age 76   EYE SURGERY     FOOT ARTHRODESIS Right 02/02/2013   Procedure: RIGHT HALLUX METATARSAL PHALANGEAL JOINT ARTHRODESIS ;  Surgeon: Toni Arthurs, MD;  Location: [facility];  Service: Orthopedics;   Laterality: Right;   INCISION AND DRAINAGE ABSCESS Left 09/03/2022   Procedure: INCISION AND DRAINAGE LEFT FACIAL  ABSCESS;  Surgeon: Scarlette Ar, MD;  Location: [facility] OR;  Service: ENT;  Laterality: Left;   INCISION AND DRAINAGE ABSCESS Left 09/07/2022   Procedure: IRRIGATION AND DEBRIDMENT OF LEFT FACIAL ABSCESS;  Surgeon: Scarlette Ar, MD;  Location: [facility] OR;  Service: ENT;  Laterality: Left;   INGUINAL HERNIA REPAIR  age 37   rt side   NASAL CONCHA BULLOSA RESECTION  age 19   PROSTATE SURGERY     SHOULDER ARTHROSCOPY W/ ROTATOR CUFF REPAIR Right    early 2000s   tonsil     VASECTOMY  age 15    ALLERGIES: Allergies  Allergen Reactions   Dust Mite Extract Shortness Of Breath   Other Other (See Comments)    DUST- Respiratory distress    FAMILY HISTORY: Family History  Problem Relation Age of Onset   Coronary artery disease Brother    Heart disease Father    Lung cancer Father    Kidney cancer Father    Prostate cancer Father    Arthritis Mother    Lung cancer Mother    Dementia Mother        Unspecified type, not Alzheimer's disease    SOCIAL HISTORY: Social History   Tobacco Use   Smoking status: Former    Current packs/day: 0.00    Average packs/day: 2.0 packs/day for 10.0 years (20.0 ttl pk-yrs)    Types: Cigarettes    Start date: 07/17/1965    Quit date: 07/18/1975    Years since quitting: 54.8  Passive exposure: Past   Smokeless tobacco: Never  Vaping Use   Vaping status: Never Used  Substance Use Topics   Alcohol use: Not Currently    Comment: one - two drinks/week   Drug use: No   Social History   Social History Narrative   Right handed   Master degree   Drinks caffeine retired   retired       CC: Burns, Bobette Mo, MD [address] [place] [address]

## 2023-05-06 DIAGNOSIS — M170 Bilateral primary osteoarthritis of knee: Secondary | ICD-10-CM | POA: Diagnosis not present

## 2023-05-11 ENCOUNTER — Other Ambulatory Visit (HOSPITAL_COMMUNITY): Payer: Self-pay | Admitting: Physician Assistant

## 2023-05-11 ENCOUNTER — Telehealth (HOSPITAL_COMMUNITY): Payer: Self-pay

## 2023-05-11 DIAGNOSIS — I771 Stricture of artery: Secondary | ICD-10-CM

## 2023-05-11 NOTE — Telephone Encounter (Signed)
Called to schedule consult w/Dr. Corliss Skains, no answer, left vm. AB

## 2023-05-13 ENCOUNTER — Other Ambulatory Visit: Payer: Self-pay | Admitting: Internal Medicine

## 2023-05-16 DIAGNOSIS — J449 Chronic obstructive pulmonary disease, unspecified: Secondary | ICD-10-CM | POA: Diagnosis not present

## 2023-05-21 ENCOUNTER — Telehealth (HOSPITAL_COMMUNITY): Payer: Self-pay | Admitting: Radiology

## 2023-05-21 NOTE — Telephone Encounter (Signed)
Called pt to schedule consult with Deveshwar. No answer, left VM for pt to call back to schedule. JM

## 2023-05-24 ENCOUNTER — Telehealth (HOSPITAL_COMMUNITY): Payer: Self-pay

## 2023-05-24 ENCOUNTER — Telehealth: Payer: Self-pay

## 2023-05-24 NOTE — Telephone Encounter (Signed)
Called to schedule consult, no answer, left vm. AB

## 2023-05-24 NOTE — Telephone Encounter (Signed)
Patient has been notified of multiple attempts from Dr. Elissa Hefty office, patient not returning call. So I contacted patient and left message to call there office, and to follow up as scheduled. [phone]. He needs to follow up as scheduled.Jonathan [name]

## 2023-05-26 NOTE — Progress Notes (Signed)
Carelink Summary Report / Loop Recorder 

## 2023-05-29 ENCOUNTER — Other Ambulatory Visit: Payer: Self-pay | Admitting: Cardiology

## 2023-05-30 DIAGNOSIS — M47816 Spondylosis without myelopathy or radiculopathy, lumbar region: Secondary | ICD-10-CM | POA: Diagnosis not present

## 2023-05-30 DIAGNOSIS — M5416 Radiculopathy, lumbar region: Secondary | ICD-10-CM | POA: Diagnosis not present

## 2023-05-31 ENCOUNTER — Ambulatory Visit (HOSPITAL_COMMUNITY)
Admission: RE | Admit: 2023-05-31 | Discharge: 2023-05-31 | Disposition: A | Discharge status: Home / Self Care | Payer: Medicare HMO | Source: Ambulatory Visit | Attending: Physician Assistant | Admitting: Physician Assistant

## 2023-06-01 ENCOUNTER — Other Ambulatory Visit (HOSPITAL_COMMUNITY): Payer: Self-pay | Admitting: Interventional Radiology

## 2023-06-01 ENCOUNTER — Ambulatory Visit (HOSPITAL_COMMUNITY)
Admission: RE | Admit: 2023-06-01 | Discharge: 2023-06-01 | Disposition: A | Discharge status: Home / Self Care | Payer: Medicare HMO | Source: Ambulatory Visit | Attending: Physician Assistant | Admitting: Physician Assistant

## 2023-06-01 DIAGNOSIS — D696 Thrombocytopenia, unspecified: Secondary | ICD-10-CM | POA: Diagnosis not present

## 2023-06-01 DIAGNOSIS — L0201 Cutaneous abscess of face: Secondary | ICD-10-CM | POA: Diagnosis not present

## 2023-06-01 DIAGNOSIS — I771 Stricture of artery: Secondary | ICD-10-CM

## 2023-06-01 DIAGNOSIS — G459 Transient cerebral ischemic attack, unspecified: Secondary | ICD-10-CM | POA: Diagnosis not present

## 2023-06-01 DIAGNOSIS — L723 Sebaceous cyst: Secondary | ICD-10-CM | POA: Diagnosis not present

## 2023-06-02 HISTORY — PX: IR RADIOLOGIST EVAL & MGMT: IMG5224

## 2023-06-03 ENCOUNTER — Telehealth (HOSPITAL_COMMUNITY): Payer: Self-pay

## 2023-06-03 NOTE — Telephone Encounter (Signed)
Called to schedule diagnostic angiogram, no answer, left vm. AB  

## 2023-06-07 ENCOUNTER — Ambulatory Visit (INDEPENDENT_AMBULATORY_CARE_PROVIDER_SITE_OTHER): Payer: Medicare HMO

## 2023-06-07 DIAGNOSIS — R55 Syncope and collapse: Secondary | ICD-10-CM | POA: Diagnosis not present

## 2023-06-07 LAB — CUP PACEART REMOTE DEVICE CHECK
Date Time Interrogation Session: 20241222231650
Implantable Pulse Generator Implant Date: 20230612

## 2023-06-08 ENCOUNTER — Other Ambulatory Visit (HOSPITAL_COMMUNITY): Payer: Self-pay | Admitting: Student

## 2023-06-08 DIAGNOSIS — M5416 Radiculopathy, lumbar region: Secondary | ICD-10-CM | POA: Diagnosis not present

## 2023-06-08 DIAGNOSIS — I771 Stricture of artery: Secondary | ICD-10-CM

## 2023-06-10 ENCOUNTER — Encounter (HOSPITAL_COMMUNITY): Payer: Self-pay

## 2023-06-10 ENCOUNTER — Other Ambulatory Visit (HOSPITAL_COMMUNITY): Payer: Self-pay | Admitting: Interventional Radiology

## 2023-06-10 ENCOUNTER — Ambulatory Visit (HOSPITAL_COMMUNITY)
Admission: RE | Admit: 2023-06-10 | Discharge: 2023-06-10 | Disposition: A | Discharge status: Home / Self Care | Payer: Medicare HMO | Source: Ambulatory Visit | Attending: Interventional Radiology | Admitting: Interventional Radiology

## 2023-06-10 ENCOUNTER — Other Ambulatory Visit: Payer: Self-pay

## 2023-06-10 DIAGNOSIS — I6622 Occlusion and stenosis of left posterior cerebral artery: Secondary | ICD-10-CM | POA: Diagnosis not present

## 2023-06-10 DIAGNOSIS — I6501 Occlusion and stenosis of right vertebral artery: Secondary | ICD-10-CM | POA: Diagnosis not present

## 2023-06-10 DIAGNOSIS — Z87891 Personal history of nicotine dependence: Secondary | ICD-10-CM | POA: Diagnosis not present

## 2023-06-10 DIAGNOSIS — I4819 Other persistent atrial fibrillation: Secondary | ICD-10-CM | POA: Diagnosis not present

## 2023-06-10 DIAGNOSIS — I6502 Occlusion and stenosis of left vertebral artery: Secondary | ICD-10-CM | POA: Diagnosis not present

## 2023-06-10 DIAGNOSIS — I771 Stricture of artery: Secondary | ICD-10-CM

## 2023-06-10 DIAGNOSIS — E119 Type 2 diabetes mellitus without complications: Secondary | ICD-10-CM | POA: Diagnosis not present

## 2023-06-10 DIAGNOSIS — I6602 Occlusion and stenosis of left middle cerebral artery: Secondary | ICD-10-CM | POA: Diagnosis not present

## 2023-06-10 DIAGNOSIS — J449 Chronic obstructive pulmonary disease, unspecified: Secondary | ICD-10-CM | POA: Diagnosis not present

## 2023-06-10 DIAGNOSIS — I6521 Occlusion and stenosis of right carotid artery: Secondary | ICD-10-CM | POA: Diagnosis not present

## 2023-06-10 DIAGNOSIS — D696 Thrombocytopenia, unspecified: Secondary | ICD-10-CM | POA: Diagnosis not present

## 2023-06-10 DIAGNOSIS — T8385XS Stenosis of genitourinary prosthetic devices, implants and grafts, sequela: Secondary | ICD-10-CM

## 2023-06-10 HISTORY — PX: IR ANGIO INTRA EXTRACRAN SEL COM CAROTID INNOMINATE BILAT MOD SED: IMG5360

## 2023-06-10 HISTORY — PX: IR ANGIO VERTEBRAL SEL VERTEBRAL UNI R MOD SED: IMG5368

## 2023-06-10 HISTORY — PX: IR ANGIO VERTEBRAL SEL SUBCLAVIAN INNOMINATE UNI L MOD SED: IMG5364

## 2023-06-10 LAB — CBC
HCT: 38.8 % — ABNORMAL LOW (ref 39.0–52.0)
HCT: 39 % (ref 39.0–52.0)
Hemoglobin: 12.4 g/dL — ABNORMAL LOW (ref 13.0–17.0)
Hemoglobin: 12 g/dL — ABNORMAL LOW (ref 13.0–17.0)
MCH: 29.2 pg (ref 26.0–34.0)
MCH: 28.3 pg (ref 26.0–34.0)
MCHC: 32 g/dL (ref 30.0–36.0)
MCHC: 30.8 g/dL (ref 30.0–36.0)
MCV: 91.3 fL (ref 80.0–100.0)
MCV: 92 fL (ref 80.0–100.0)
Platelets: 58 10*3/uL — ABNORMAL LOW (ref 150–400)
Platelets: 57 10*3/uL — ABNORMAL LOW (ref 150–400)
RBC: 4.25 MIL/uL (ref 4.22–5.81)
RBC: 4.24 MIL/uL (ref 4.22–5.81)
RDW: 16 % — ABNORMAL HIGH (ref 11.5–15.5)
RDW: 15.9 % — ABNORMAL HIGH (ref 11.5–15.5)
WBC: 16.2 10*3/uL — ABNORMAL HIGH (ref 4.0–10.5)
WBC: 16.3 10*3/uL — ABNORMAL HIGH (ref 4.0–10.5)
nRBC: 0 % (ref 0.0–0.2)
nRBC: 0 % (ref 0.0–0.2)

## 2023-06-10 LAB — BASIC METABOLIC PANEL
Anion gap: 11 (ref 5–15)
BUN: 26 mg/dL — ABNORMAL HIGH (ref 8–23)
CO2: 22 mmol/L (ref 22–32)
Calcium: 8.7 mg/dL — ABNORMAL LOW (ref 8.9–10.3)
Chloride: 109 mmol/L (ref 98–111)
Creatinine, Ser: 1.36 mg/dL — ABNORMAL HIGH (ref 0.61–1.24)
GFR, Estimated: 50 mL/min — ABNORMAL LOW (ref >60)
Glucose, Bld: 102 mg/dL — ABNORMAL HIGH (ref 70–99)
Potassium: 3.4 mmol/L — ABNORMAL LOW (ref 3.5–5.1)
Sodium: 142 mmol/L (ref 135–145)

## 2023-06-10 LAB — DIFFERENTIAL
Abs Immature Granulocytes: 0 10*3/uL (ref 0.00–0.07)
Basophils Absolute: 0 10*3/uL (ref 0.0–0.1)
Basophils Relative: 0 %
Eosinophils Absolute: 0 10*3/uL (ref 0.0–0.5)
Eosinophils Relative: 0 %
Lymphocytes Relative: 23 %
Lymphs Abs: 3.7 10*3/uL (ref 0.7–4.0)
Monocytes Absolute: 2.8 10*3/uL — ABNORMAL HIGH (ref 0.1–1.0)
Monocytes Relative: 17 %
Neutro Abs: 9.8 10*3/uL — ABNORMAL HIGH (ref 1.7–7.7)
Neutrophils Relative %: 60 %

## 2023-06-10 LAB — GLUCOSE, CAPILLARY: Glucose-Capillary: 110 mg/dL — ABNORMAL HIGH (ref 70–99)

## 2023-06-10 LAB — PROTIME-INR
INR: 1.1 (ref 0.8–1.2)
Prothrombin Time: 13.9 s (ref 11.4–15.2)

## 2023-06-10 MED ORDER — FENTANYL CITRATE (PF) 100 MCG/2ML IJ SOLN
INTRAMUSCULAR | Status: AC | PRN
  Administered 2023-06-10: 25 ug via INTRAVENOUS

## 2023-06-10 MED ORDER — MIDAZOLAM HCL 2 MG/2ML IJ SOLN
INTRAMUSCULAR | Status: AC | PRN
  Administered 2023-06-10: 1 mg via INTRAVENOUS

## 2023-06-10 MED ORDER — FENTANYL CITRATE (PF) 100 MCG/2ML IJ SOLN
INTRAMUSCULAR | Status: AC
  Filled 2023-06-10: qty 2

## 2023-06-10 MED ORDER — IOHEXOL 300 MG/ML  SOLN
150.0000 mL | Freq: Once | INTRAMUSCULAR | Status: AC | PRN
  Administered 2023-06-10: 80 mL via INTRA_ARTERIAL

## 2023-06-10 MED ORDER — HEPARIN SODIUM (PORCINE) 1000 UNIT/ML IJ SOLN
INTRAMUSCULAR | Status: AC | PRN
  Administered 2023-06-10: 1000 [IU] via INTRAVENOUS

## 2023-06-10 MED ORDER — LIDOCAINE HCL 1 % IJ SOLN
INTRAMUSCULAR | Status: AC
  Filled 2023-06-10: qty 20

## 2023-06-10 MED ORDER — HEPARIN SODIUM (PORCINE) 1000 UNIT/ML IJ SOLN
INTRAMUSCULAR | Status: AC
  Filled 2023-06-10: qty 1

## 2023-06-10 MED ORDER — MIDAZOLAM HCL 2 MG/2ML IJ SOLN
INTRAMUSCULAR | Status: AC
  Filled 2023-06-10: qty 2

## 2023-06-10 NOTE — H&P (Signed)
Chief Complaint: Patient was seen in consultation today for diagnostic cerebral angiogram at the request of Marlowe Kays PA-C  Referring Physician(s): Marlowe Kays PA-C  Supervising Physician: Julieanne Cotton  Patient Status: [facility] - Out-pt  History of Present Illness: Jonathan [name] is a 89 y.o. male with PMHs of COPD, a fib, DM, thrombocytopenia and worsening balance difficulties and falling, MAR finding of intracranial artery stenosis.   Patient was referred to Dr. Corliss Skains due to worsening balance difficulties and Falling, MRA on 04/14/23 showed:  1. Stable noncontrast brain MRI since 09/04/2021 with unchanged parenchymal volume loss but no acute intracranial pathology. 2. Severe stenosis of a proximal left M2 branches increased since the study from 09/24/2021. 3. Unchanged mild-to-moderate stenosis of the distal left M1 segment and at the left P1/P2 junction. 4. Unchanged occlusion of the left V4 segment after the PICA origin. 5. Patent vasculature in the neck without hemodynamically significant stenosis or occlusion.  Diagnostic cerebral angiogram was recommended to the patient which he decided to proceed, patient presents to [facility] IR today.   Pre-op labs showed elevated WBCc, VSS, RF slightly impaired, BUN 26, creatinine 1.36, GFR 60.  Differential added, shows elevated neutrophils. Lymphocytes wnl.   Patient seen in IR.  Patient laying in bed, not in acute distress.  Reports chronic vision changes which is worsening, seeing eye doctor.  Denise headache, fever, chills, shortness of breath, cough, chest pain, abdominal pain, nausea ,vomiting, and bleeding. Denies symptoms of UTI, sinusitis.   Discussed regarding elevated WBCc. Patient was informed that it appears he might have a mild infection, recommended him to follow up with PCP in 1-2 weeks for repeat labs and further eval. Patient verbalized understanding.   Past Medical History:  Diagnosis Date   Abdominal  aortic atherosclerosis 02/11/2022   Acute encephalopathy 05/08/2019   Allergic rhinitis, seasonal 12/04/2011   Aortic atherosclerosis 02/04/2021   Arthritis of finger of both hands 03/27/2021   Atrial fibrillation (HCC)    BPH (benign prostatic hyperplasia)    Cataract    Chronic anticoagulation 12/21/2017   CHADS VASC=4, Eliquis stopped Sept 2021- recurrent falls with SDH   Chronic pain, legs and back 03/10/2017   Preferred pain management Leg and back pain   Compression fracture of thoracic vertebra 09/21/2017   COPD (chronic obstructive pulmonary disease)    2 liters O2 HS   Degeneration of thoracic intervertebral disc 09/21/2017   Degenerative joint disease of hand 07/14/2017   Dupuytren's contracture 07/12/2019   Essential hypertension 12/04/2011   Fatty liver 12/29/2020   Fatty tumor    waste and back   Fibromyalgia    Generalized anxiety disorder 06/12/2020   12/21 He is reporting severe insomnia and anxiety symptoms.  Options are limited.  We will try a low-dose lorazepam at night and during the day for anxiety and panic attacks.  He will stop lorazepam if problems.  He will stop drinking alcohol.  Discontinue BuSpar.  Reduce Cymbalta to 1 a day.   GERD (gastroesophageal reflux disease)    Glaucoma 03/08/2016   Gout 03/08/2016   Hereditary and idiopathic peripheral neuropathy 08/28/2015   History of COVID-19 05/08/2019   2021 Post-COVID sx's -" brain fog" Try Lion's mane supplement and B complex with niacin for neuropathy   Hyperlipidemia 03/10/2017   Hypothyroidism    Hypoxia    Insomnia    Left-sided sensorineural hearing loss 07/12/2018   Lumbar spondylosis 02/11/2022   Major depressive disorder 09/07/2016   Mild cognitive impairment of uncertain  or unknown etiology 12/27/2020   Obesity (BMI 30.0-34.9) 12/08/2016   Obstructive sleep apnea    Osteoarthritis    Osteoporosis    Pain in joint of right ankle 09/15/2021   Persistent atrial fibrillation    on Eliquis    Recurrent falls 03/25/2020   PT. Treat neuropathy, insomnia.  Reduce Cymbalta to 1 a day.  Discontinue BuSpar.   Rib pain on left side 07/25/2021   Rotator cuff arthropathy of left shoulder 07/21/2019   SDH (subdural hematoma) 03/21/2020   Recurrent SDH secondary to falls at home- Sept 2019 and again 03/14/2020- Eliquis stopped.   Sebaceous cyst 06/27/2019   Spinal compression fracture seventh vertebre   Spondylolisthesis 09/07/2016   On fosamax - for about one year - Dr Ronne Binning  10/14/17 dexa: normal dexa -- spine 2.1,   RFN -0.9,  LFN   -0.7   - no comparison on file, previous fracture      Thoracic back pain 08/10/2017   Thrombocytopenia 12/20/2020   TIA (transient ischemic attack) 08/28/2015   Type 2 diabetes mellitus 03/08/2016    Past Surgical History:  Procedure Laterality Date   APPENDECTOMY  age 31   CARPAL TUNNEL RELEASE Right    early 2000s   CATARACT EXTRACTION     bilateral   CHOLECYSTECTOMY  age 52   EYE SURGERY     FOOT ARTHRODESIS Right 02/02/2013   Procedure: RIGHT HALLUX METATARSAL PHALANGEAL JOINT ARTHRODESIS ;  Surgeon: Toni Arthurs, MD;  Location: [facility];  Service: Orthopedics;  Laterality: Right;   INCISION AND DRAINAGE ABSCESS Left 09/03/2022   Procedure: INCISION AND DRAINAGE LEFT FACIAL  ABSCESS;  Surgeon: Scarlette Ar, MD;  Location: [facility] OR;  Service: ENT;  Laterality: Left;   INCISION AND DRAINAGE ABSCESS Left 09/07/2022   Procedure: IRRIGATION AND DEBRIDMENT OF LEFT FACIAL ABSCESS;  Surgeon: Scarlette Ar, MD;  Location: [facility] OR;  Service: ENT;  Laterality: Left;   INGUINAL HERNIA REPAIR  age 40   rt side   IR RADIOLOGIST EVAL & MGMT  06/02/2023   NASAL CONCHA BULLOSA RESECTION  age 12   PROSTATE SURGERY     SHOULDER ARTHROSCOPY W/ ROTATOR CUFF REPAIR Right    early 2000s   tonsil     VASECTOMY  age 65    Allergies: Dust mite extract and Other  Medications: Prior to Admission medications   Medication Sig Start Date End Date  Taking? Authorizing Provider  acetaminophen (TYLENOL) 650 MG CR tablet Take 650 mg by mouth in the morning and at bedtime.    [provider]  alendronate (FOSAMAX) 70 MG tablet     [provider]  allopurinol (ZYLOPRIM) 300 MG tablet TAKE 1 TABLET (300 MG TOTAL) BY MOUTH EVERY EVENING. FOR GOUT 09/28/22   Pincus Sanes, MD  amiodarone (PACERONE) 200 MG tablet TAKE 1 TABLET EVERY DAY 09/21/22   Lewayne Bunting, MD  amLODipine (NORVASC) 5 MG tablet TAKE 1 TABLET (5 MG TOTAL) BY MOUTH DAILY. 07/03/22   Lewayne Bunting, MD  atorvastatin (LIPITOR) 20 MG tablet TAKE 1 TABLET BY MOUTH EVERY DAY FOR CHOLESTEROL 05/17/23   Pincus Sanes, MD  Calcium Citrate-Vitamin D (CALCIUM + D PO) Take 1 tablet by mouth daily.    [provider]  carboxymethylcellulose (REFRESH PLUS) 0.5 % SOLN Place 1 drop into both eyes 3 (three) times daily as needed (dry eyes).    [provider]  Emollient (RA RENEWAL DARK SPOT CORRECTOR EX) Apply  1 application topically daily as needed (dark spots).    [provider]  FARXIGA 10 MG TABS tablet TAKE 1 TABLET (10 MG TOTAL) BY MOUTH DAILY BEFORE BREAKFAST. FOR DIABETES 03/12/23   Pincus Sanes, MD  FLUoxetine (PROZAC) 40 MG capsule Take 1 capsule (40 mg total) by mouth daily. 11/23/22   Pincus Sanes, MD  Fluticasone Propionate (ALLERGY RELIEF NA) Place 2 sprays into the nose daily as needed (allergies).    [provider]  furosemide (LASIX) 40 MG tablet Take 40 mg by mouth daily.    [provider]  hydrochlorothiazide (HYDRODIURIL) 12.5 MG tablet TAKE 1 TABLET BY MOUTH EVERY DAY 05/17/23   Pincus Sanes, MD  Lancets MISC Use to check blood sugars daily. E11.9 11/08/20   Pincus Sanes, MD  latanoprost (XALATAN) 0.005 % ophthalmic solution Place 1 drop into both eyes at bedtime. 04/18/21   [provider]  levothyroxine (SYNTHROID) 150 MCG tablet TAKE 1 TABLET BY MOUTH EVERY DAY 10/15/22   Pincus Sanes, MD   Lido-Menthol-Methyl Sal-Camph (CBD KINGS EX) Apply 1 Application topically as needed (pain).    [provider]  Melatonin 5 MG TABS Take 5 mg by mouth at bedtime.    [provider]  mirabegron ER (MYRBETRIQ) 25 MG TB24 tablet     [provider]  Multiple Vitamin (MULTIVITAMIN) tablet Take 1 tablet by mouth daily.    [provider]  nitroGLYCERIN (NITROSTAT) 0.4 MG SL tablet Place 1 tablet (0.4 mg total) under the tongue every 5 (five) minutes as needed for chest pain. 07/31/21   [place], Peter M, MD  omeprazole (PRILOSEC) 20 MG capsule TAKE 1 CAPSULE BY MOUTH EVERY DAY 10/05/22   Pincus Sanes, MD  oxyCODONE-acetaminophen (PERCOCET) 10-325 MG tablet Take 1 tablet by mouth every 8 (eight) hours as needed for pain.    [provider]  pregabalin (LYRICA) 75 MG capsule TAKE 1 CAPSULE BY MOUTH TWICE A DAY 03/30/23   Pincus Sanes, MD  Probiotic Product (PROBIOTIC PO) Take 1 capsule by mouth daily.    [provider]  RESTASIS 0.05 % ophthalmic emulsion Place 1 drop into both eyes 2 (two) times daily.  09/27/19   [provider]  Semaglutide (RYBELSUS) 7 MG TABS Take 7 mg by mouth daily. Via [employer] pt assistance 03/25/21   Pincus Sanes, MD  sildenafil (REVATIO) 20 MG tablet Take 40-100 mg by mouth daily as needed. 12/01/19   [provider]  silodosin (RAPAFLO) 8 MG CAPS capsule Take 8 mg by mouth daily.    [provider]  telmisartan (MICARDIS) 40 MG tablet TAKE 1 TABLET BY MOUTH EVERY DAY 05/31/23   Camnitz, Will Daphine Deutscher, MD  Vibegron (GEMTESA) 75 MG TABS Take 75 mg by mouth daily.    [provider]  vitamin C (ASCORBIC ACID) 500 MG tablet Take 500 mg by mouth daily.    [provider]  VITAMIN D, CHOLECALCIFEROL, PO Take 1 capsule by mouth daily.    [provider]     Family History  Problem Relation Age of Onset   Coronary artery disease Brother    Heart disease Father    Lung  cancer Father    Kidney cancer Father    Prostate cancer Father    Arthritis Mother    Lung cancer Mother    Dementia Mother        Unspecified type, not Alzheimer's disease  Social History   Socioeconomic History   Marital status: Married    Spouse name: Not on file   Number of children: 2   Years of education: 71   Highest education level: Master's degree (e.g., MA, MS, MEng, MEd, MSW, MBA)  Occupational History   Occupation: Retired  Tobacco Use   Smoking status: Former    Current packs/day: 0.00    Average packs/day: 2.0 packs/day for 10.0 years (20.0 ttl pk-yrs)    Types: Cigarettes    Start date: 07/17/1965    Quit date: 07/18/1975    Years since quitting: 47.9    Passive exposure: Past   Smokeless tobacco: Never  Vaping Use   Vaping status: Never Used  Substance and Sexual Activity   Alcohol use: Not Currently    Comment: one - two drinks/week   Drug use: No   Sexual activity: Not Currently  Other Topics Concern   Not on file  Social History Narrative   Right handed   Master degree   Drinks caffeine retired   retired   Chief Executive Officer Drivers of Corporate investment banker Strain: Low Risk  (09/16/2022)   Overall Financial Resource Strain (CARDIA)    Difficulty of Paying Living Expenses: Not hard at all  Food Insecurity: No Food Insecurity (09/16/2022)   Hunger Vital Sign    Worried About Running Out of Food in the Last Year: Never true    Ran Out of Food in the Last Year: Never true  Transportation Needs: No Transportation Needs (09/16/2022)   PRAPARE - Administrator, Civil Service (Medical): No    Lack of Transportation (Non-Medical): No  Physical Activity: Inactive (09/16/2022)   Exercise Vital Sign    Days of Exercise per Week: 0 days    Minutes of Exercise per Session: 0 min  Stress: No Stress Concern Present (09/16/2022)   [employer] of Occupational Health - Occupational Stress Questionnaire    Feeling of Stress : Only a little  Social  Connections: Unknown (09/16/2022)   Social Connection and Isolation Panel [NHANES]    Frequency of Communication with Friends and Family: More than three times a week    Frequency of Social Gatherings with Friends and Family: Three times a week    Attends Religious Services: Patient declined    Active Member of Clubs or Organizations: Yes    Attends Banker Meetings: More than 4 times per year    Marital Status: Living with partner     Review of Systems: A 12 point ROS discussed and pertinent positives are indicated in the HPI above.  All other systems are negative.  Vital Signs: BP (!) 137/53   Pulse (!) 55   Temp 97.9 F (36.6 C) (Oral)   Resp 18   Ht 5\' 6"  (1.676 m)   Wt 185 lb (83.9 kg)   SpO2 97%   BMI 29.86 kg/m    Physical Exam Vitals and nursing note reviewed.  Constitutional:      General: Patient is not in acute distress.    Appearance: Normal appearance. Patient is not ill-appearing.  HENT:     Head: Normocephalic and atraumatic.     Mouth/Throat:     Mouth: Mucous membranes are moist.     Pharynx: Oropharynx is clear.  Cardiovascular:     Rate and Rhythm: Normal rate and regular rhythm.     Pulses: Normal pulses.     Heart sounds: Normal heart sounds.  Pulmonary:  Effort: Pulmonary effort is normal.     Breath sounds: Normal breath sounds.  Abdominal:     General: Abdomen is flat. Bowel sounds are normal.     Palpations: Abdomen is soft.  Musculoskeletal:     Cervical back: Neck supple.  Skin:    General: Skin is warm and dry.     Coloration: Skin is not jaundiced or pale.  Neurological:     Mental Status: Patient is alert and oriented to person, place, and time.  Psychiatric:        Mood and Affect: Mood normal.        Behavior: Behavior normal.        Judgment: Judgment normal.     MD Evaluation Airway: WNL Heart: WNL Abdomen: WNL Chest/ Lungs: WNL ASA  Classification: 3 Mallampati/Airway Score: Two  Imaging: CUP PACEART  REMOTE DEVICE CHECK Result Date: 06/07/2023 ILR summary report received. Battery status OK. Normal device function. No new symptom, tachy, brady, or pause episodes. 20 new AF episodes, burden 3.3%, longest episode 3 hours, 52 minutes, ventricular rates greater than 110 bpm about 30% of the time, not on OAC due to falls per PA report. Monthly summary reports and ROV/PRN KS, CVRS  IR Radiologist Eval & Mgmt Result Date: 06/02/2023 EXAM: NEW PATIENT OFFICE VISIT CHIEF COMPLAINT: History of short-term memory lapses, and balance difficulties associated with falling worsening. Abnormal MRA of the brain. Current Pain Level: 1-10 HISTORY OF PRESENT ILLNESS: The patient is a 89 year old right-handed gentleman who has been referred for evaluation of worsening balance difficulties and falling. History obtained from the patient, and the patient's electronic medical records. The patient also reports a history of short-term memory difficulties. Patient reports the symptoms of falling with balance difficulties worsened over the past 2 years. He is unsure as to whether these came on suddenly or being gradual. He reports the symptoms to be precipitated with sudden standing or bending or turning his head. There are no associated symptoms of speech difficulties, limb paresthesias, or facial abnormalities, or of perioral numbness, or difficulty with swallowing liquids or solids, or symptoms of vertigo. He reports at least 2 times where he has completely passed out with these lasting a few seconds. Additionally, he reports no preceding palpitations, difficulty breathing, shortness of breath, or of coughing. Denies any seizure-like activity. His memory difficulties are more short term memory lapses with inability to recall events lasting from a few hours to a day. For example he is unable to recollect what he ate for dinner last night. He denies any shortness of breath, palpitations, or paroxysmal nocturnal dyspnea. Does report  significant generalized lack of energy with walking where he has to sit down. He denies any coughing, wheezing, or sputum production. Appetite is normal weight is steady. No abdominal pains, constipation, diarrhea or melena. Denies any dysuria, hematuria, or polyuria. No recent chills, fever or rigors. Diagnosis * : Date . * : Abdominal aortic atherosclerosis * : 02/11/2022 . * : Acute encephalopathy * : 05/08/2019 . * : Allergic rhinitis, seasonal * : 12/04/2011 . * : Aortic atherosclerosis * : 02/04/2021 . * : Arthritis of finger of both hands * : 03/27/2021 . * : Atrial fibrillation (HCC) * : . * : BPH (benign prostatic hyperplasia) * : . * : Cataract * : . * : Chronic anticoagulation * : 12/21/2017 * : CHADS VASC=4, Eliquis stopped Sept 2021- recurrent falls with SDH . * : Chronic pain, legs and back * : 03/10/2017 * :  Preferred pain management Leg and back pain . * : Compression fracture of thoracic vertebra * : 09/21/2017 . * : COPD (chronic obstructive pulmonary disease) * : * : 2 liters O2 HS . * : Degeneration of thoracic intervertebral disc * : 09/21/2017 . * : Degenerative joint disease of hand * : 07/14/2017 . * : Dupuytren's contracture * : 07/12/2019 . * : Essential hypertension * : 12/04/2011 . * : Fatty liver * : 12/29/2020 . * : Fatty tumor * : * : waste and back . * : Fibromyalgia * : . * : Generalized anxiety disorder * : 06/12/2020 * : 12/21 He is reporting severe insomnia and anxiety symptoms. Options are limited. We will try a low-dose lorazepam at night and during the day for anxiety and panic attacks. He will stop lorazepam if problems. He will stop drinking alcohol. Discontinue BuSpar. Reduce Cymbalta to 1 a day. . * : GERD (gastroesophageal reflux disease) * : . * : Glaucoma * : 03/08/2016 . * : Gout * : 03/08/2016 . * : Hereditary and idiopathic peripheral neuropathy * : 08/28/2015 . * : History of COVID-19 * : 05/08/2019 * : 2021 Post-COVID sx's -" brain fog" Try Lion's mane supplement  and B complex with niacin for neuropathy . * : Hyperlipidemia * : 03/10/2017 . * : Hypothyroidism * : . * : Hypoxia * : . * : Insomnia * : . * : Left-sided sensorineural hearing loss * : 07/12/2018 . * : Lumbar spondylosis * : 02/11/2022 . * : Major depressive disorder * : 09/07/2016 . * : Mild cognitive impairment of uncertain or unknown etiology * : 12/27/2020 . * : Obesity (BMI 30.0-34.9) * : 12/08/2016 . * : Obstructive sleep apnea * : . * : Osteoarthritis * : . * : Osteoporosis * : . * : Pain in joint of right ankle * : 09/15/2021 . * : Persistent atrial fibrillation * : * : on Eliquis . * : Recurrent falls * : 03/25/2020 * : PT. Treat neuropathy, insomnia. Reduce Cymbalta to 1 a day. Discontinue BuSpar. . * : Rib pain on left side * : 07/25/2021 . * : Rotator cuff arthropathy of left shoulder * : 07/21/2019 . * : SDH (subdural hematoma) * : 03/21/2020 * : Recurrent SDH secondary to falls at home- Sept 2019 and again 03/14/2020- Eliquis stopped. . * : Sebaceous cyst * : 06/27/2019 . * : Spinal compression fracture * : seventh vertebre . * : Spondylolisthesis * : 09/07/2016 * : On fosamax - for about one year - Dr Ronne Binning 10/14/17 dexa: normal dexa -- spine 2.1, RFN -0.9, LFN -0.7 - no comparison on file, previous fracture . * : Thoracic back pain * : 08/10/2017 . * : Thrombocytopenia * : 12/20/2020 . * : TIA (transient ischemic attack) * : 08/28/2015 . * : Type 2 diabetes mellitus * : 03/08/2016 : Past Surgical History: Procedure * : Laterality * : Date . * : APPENDECTOMY * : * : age 47 . * : CARPAL TUNNEL RELEASE * : Right * : * : early 2000s . * : CATARACT EXTRACTION * : * : * : bilateral . * : CHOLECYSTECTOMY * : * : age 70 . * : EYE SURGERY * : * : . * : FOOT ARTHRODESIS * : Right * : 02/02/2013 * : Procedure: RIGHT HALLUX METATARSAL PHALANGEAL JOINT ARTHRODESIS ; Surgeon: Toni Arthurs, MD; Location: [facility]; Service: Orthopedics; Laterality:  Right; . * : INCISION AND DRAINAGE ABSCESS * : Left  * : 09/03/2022 * : Procedure: INCISION AND DRAINAGE LEFT FACIAL ABSCESS; Surgeon: Scarlette Ar, MD; Location: [facility] OR; Service: ENT; Laterality: Left; . * : INCISION AND DRAINAGE ABSCESS * : Left * : 09/07/2022 * : Procedure: IRRIGATION AND DEBRIDMENT OF LEFT FACIAL ABSCESS; Surgeon: Scarlette Ar, MD; Location: [facility] OR; Service: ENT; Laterality: Left; . * : INGUINAL HERNIA REPAIR * : * : age 36 * : rt side . * : NASAL CONCHA BULLOSA RESECTION * : * : age 40 . * : PROSTATE SURGERY * : * : . * : SHOULDER ARTHROSCOPY W/ ROTATOR CUFF REPAIR * : Right * : * : early 2000s . * : tonsil * : * : . * : VASECTOMY * : * : age 49 MEDICATIONS: Outpatient Encounter Medications as of 05/05/2023 Medication * : Sig * : Note . * : acetaminophen (TYLENOL) 650 MG CR tablet * : Take 650 mg by mouth in the morning and at bedtime. * : 02/16/2023: Takes daily . * : allopurinol (ZYLOPRIM) 300 MG tablet * : TAKE 1 TABLET (300 MG TOTAL) BY MOUTH EVERY EVENING. FOR GOUT * : . * : amiodarone (PACERONE) 200 MG tablet * : TAKE 1 TABLET EVERY DAY * : . * : amLODipine (NORVASC) 5 MG tablet * : TAKE 1 TABLET (5 MG TOTAL) BY MOUTH DAILY. * : . * : atorvastatin (LIPITOR) 20 MG tablet * : TAKE 1 TABLET BY MOUTH EVERY DAY FOR CHOLESTEROL * : . * : Calcium Citrate-Vitamin D (CALCIUM + D PO) * : Take 1 tablet by mouth daily. * : . * : carboxymethylcellulose (REFRESH PLUS) 0.5 % SOLN * : Place 1 drop into both eyes 3 (three) times daily as needed (dry eyes). * : . * : Emollient (RA RENEWAL DARK SPOT CORRECTOR EX) * : Apply 1 application topically daily as needed (dark spots). * : . * : FARXIGA 10 MG TABS tablet * : TAKE 1 TABLET (10 MG TOTAL) BY MOUTH DAILY BEFORE BREAKFAST. FOR DIABETES * : . * : FLUoxetine (PROZAC) 40 MG capsule * : Take 1 capsule (40 mg total) by mouth daily. * : . * : Fluticasone Propionate (ALLERGY RELIEF NA) * : Place 2 sprays into the nose daily as needed (allergies). * : . * : furosemide (LASIX) 40 MG tablet * : Take 40 mg by mouth  daily. * : . * : hydrochlorothiazide (HYDRODIURIL) 12.5 MG tablet * : TAKE 1 TABLET BY MOUTH EVERY DAY * : . * : Lancets MISC * : Use to check blood sugars daily. E11.9 * : . * : latanoprost (XALATAN) 0.005 % ophthalmic solution * : Place 1 drop into both eyes at bedtime. * : . * : levothyroxine (SYNTHROID) 150 MCG tablet * : TAKE 1 TABLET BY MOUTH EVERY DAY * : . * : Lido-Menthol-Methyl Sal-Camph (CBD KINGS EX) * : Apply 1 Application topically as needed (pain). * : . * : Melatonin 5 MG TABS * : Take 5 mg by mouth at bedtime. * : . * : mirabegron ER (MYRBETRIQ) 25 MG TB24 tablet * : * : . * : Multiple Vitamin (MULTIVITAMIN) tablet * : Take 1 tablet by mouth daily. * : . * : nitroGLYCERIN (NITROSTAT) 0.4 MG SL tablet * : Place 1 tablet (0.4 mg total) under the tongue every 5 (five) minutes as needed for chest pain. * : . * :  omeprazole (PRILOSEC) 20 MG capsule * : TAKE 1 CAPSULE BY MOUTH EVERY DAY * : . * : oxyCODONE-acetaminophen (PERCOCET) 10-325 MG tablet * : Take 1 tablet by mouth every 8 (eight) hours as needed for pain. * : . * : pregabalin (LYRICA) 75 MG capsule * : TAKE 1 CAPSULE BY MOUTH TWICE A DAY * : . * : Probiotic Product (PROBIOTIC PO) * : Take 1 capsule by mouth daily. * : . * : RESTASIS 0.05 % ophthalmic emulsion * : Place 1 drop into both eyes 2 (two) times daily. * : . * : Semaglutide (RYBELSUS) 7 MG TABS * : Take 7 mg by mouth daily. Via [employer] pt assistance * : 09/08/2022: Getting from his physician's office. . * : sildenafil (REVATIO) 20 MG tablet * : Take 40-100 mg by mouth daily as needed. * : 09/08/2022: No dispense records in past 12 months . * : silodosin (RAPAFLO) 8 MG CAPS capsule * : Take 8 mg by mouth daily. * : . * : telmisartan (MICARDIS) 40 MG tablet * : Take 1 tablet (40 mg total) by mouth daily. * : . * : Vibegron (GEMTESA) 75 MG TABS * : Take 75 mg by mouth daily. * : 09/08/2022: Getting from his physician's office. . * : vitamin C (ASCORBIC ACID) 500 MG tablet * : Take 500 mg  by mouth daily. * : . * : VITAMIN D, CHOLECALCIFEROL, PO * : Take 1 capsule by mouth daily. * : . * : alendronate (FOSAMAX) 70 MG tablet * : (Patient not taking: Reported on 05/05/2023) * : ALLERGIES: Allergies AllergenReactions.Dust Mite Extract shortness Of Breath.Other (See Comments) DUST- Respiratory distress FAMILY HISTORY: Family History Problem * : Relation * : Age of Onset . * : Coronary artery disease * : Brother * : . * : Heart disease * : Father * : . * : Lung cancer * : Father * : . * : Kidney cancer * : Father * : . * : Prostate cancer * : Father * : . * : Arthritis * : Mother * : . * : Lung cancer * : Mother * : . * : Dementia * : Mother * : * :     Unspecified type, not Alzheimer's disease Social History: Tobacco Use.Smoking status:Former Current packs/day:0.00 Average packs/day:2.0 packs/day for 10.0 years (20.0 ttl pk-yrs) Types:Cigarettes Start date:07/17/1965 Quit date:07/18/1975 Years since quitting:47.8 Passive exposure:Past.Smokeless tobacco:NeverVaping Use.Vaping status:Never UsedSubstance Use Topics.Alcohol use:Not Currently Comment: one - two drinks/week.Drug use:No Social History Narrative * : Right handed * : Master degree * : Drinks caffeine retired * : retired REVIEW OF SYSTEMS: Negative unless as mentioned above. PHYSICAL EXAMINATION: The patient is awake, alert, oriented to time, place, space. Sitting in a wheelchair reasonably comfortable. Responses appropriate and prompt. Has no comprehension or expressive difficulties. Normal eye contact. Otherwise, no gross abnormal lateralizing neurological findings evident. Station and gait not tested. ASSESSMENT AND PLAN: The findings of the patient's most recent and previous MRI MRA studies was reviewed with him. 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 Similar though less prominent findings are noted on the right side.  Also noted is suspicion of abnormal flow signal in the distal cavernous right ICA. The MRA of the neck is suspicious of tapered caliber of the origins of the vertebral arteries bilaterally. In order to ascertain accurately the suspected abnormalities  on the MRA examination, formal catheter arteriogram was recommended and the procedure was described in detail. It would be an outpatient procedure with conscious sedation via either a radial or transfemoral route. A very small risk of ischemic stroke, including but not limited to, injury of the vessel at the access site, hematoma, contrast induced nephrotoxicity leading to acute renal failure were reviewed. The findings of the arteriogram would be helpful in deciding further management considerations, medical or endovascular. Questions were answered to the patient's satisfaction. He has decided to proceed with the diagnostic catheter arteriogram. This will be scheduled at the earliest possible. In the meantime, he was advised to maintain adequate hydration. Electronically Signed   By: Julieanne Cotton M.D.   On: 06/02/2023 07:50    Labs:  CBC: Recent Labs    10/16/22 1131 02/16/23 1051 06/10/23 0945 06/10/23 1015  WBC 8.9 8.6 16.3* 16.2*  HGB 12.7* 11.9* 12.0* 12.4*  HCT 40.4 37.5* 39.0 38.8*  PLT 72* 62* 57* 58*    COAGS: Recent Labs    06/10/23 1015  INR 1.1    BMP: Recent Labs    09/07/22 0659 09/08/22 0436 09/09/22 0340 09/17/22 1159 11/19/22 0934 06/10/23 1015  NA 141 136 139 139 141 142  K 3.6 4.0 4.0 3.8 3.7 3.4*  CL 104 103 103 104 104 109  CO2 25 22 27 31 30 22   GLUCOSE 90 215* 171* 94 97 102*  BUN 19 21 21 10 18  26*  CALCIUM 8.5* 8.4* 8.5* 8.7 9.0 8.7*  CREATININE 1.11 1.17 1.14 1.04 1.20 1.36*  GFRNONAA >60 >60 >60  --   --  50*    LIVER FUNCTION TESTS: Recent Labs    09/03/22 0447 11/19/22 0934  BILITOT 0.8 0.5  AST 19 27  ALT 17 35  ALKPHOS 82 88  PROT 5.6* 6.1  ALBUMIN 3.4* 4.2    TUMOR  MARKERS: No results for input(s): "AFPTM", "CEA", "CA199", "CHROMGRNA" in the last 8760 hours.  Assessment and Plan: 89 y.o. male with  balance difficulties and falling and MAR finding of left MCA stenosis who presents for diagnostic cerebral angiogram.   NPO since MN VSS CBC with WBC 16.3, hgb 12, plt 57 RF BUN 26, creatinine 1.36, GFR 50 (BUN 18; creatinine 1.2 on 11/19/22.) INR 1.1  Allergies reviewed   Risks and benefits of cerebral angiogram with intervention were discussed with the patient including, but not limited to bleeding, infection, vascular injury, contrast induced renal failure, stroke or even death.  This interventional procedure involves the use of X-rays and because of the nature of the planned procedure, it is possible that we will have prolonged use of X-ray fluoroscopy.  Potential radiation risks to you include (but are not limited to) the following: - A slightly elevated risk for cancer  several years later in life. This risk is typically less than 0.5% percent. This risk is low in comparison to the normal incidence of human cancer, which is 33% for women and 50% for men according to the [employer]. - Radiation induced injury can include skin redness, resembling a rash, tissue breakdown / ulcers and hair loss (which can be temporary or permanent).   The likelihood of either of these occurring depends on the difficulty of the procedure and whether you are sensitive to radiation due to previous procedures, disease, or genetic conditions.   IF your procedure requires a prolonged use of radiation, you will be notified and given written instructions for further action.  It is your responsibility to monitor the irradiated area for the 2 weeks following the procedure and to notify your physician if you are concerned that you have suffered a radiation induced injury.    All of the patient's questions were answered, patient is agreeable to proceed.  Consent  signed and in chart.     Thank you for this interesting consult.  I greatly enjoyed meeting Jonathan [name] and look forward to participating in their care.  A copy of this report was sent to the requesting provider on this date.  Electronically Signed: Willette Brace, PA-C 06/10/2023, 12:54 PM   I spent a total of   40 Minutes in face to face in clinical consultation, greater than 50% of which was counseling/coordinating care for diagnostic cerebral angiogram.   This chart was dictated using voice recognition software.  Despite best efforts to proofread,  errors can occur which can change the documentation meaning.

## 2023-06-10 NOTE — Procedures (Signed)
INR.  Status post four-vessel cerebral arteriogram.  Right CFA approach.  1.  Approximately 75% stenosis of the superior division of the left middle cerebral artery proximally.  2.  Approximately 40% stenosis of the dominant right vertebral artery at its origin.  3.  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  4.  Approximately 60 to 70 % stenosis of the Lt PCA P1 seg.  S.Brinly Maietta MD

## 2023-06-11 ENCOUNTER — Encounter: Payer: Self-pay | Admitting: Internal Medicine

## 2023-06-16 DIAGNOSIS — J449 Chronic obstructive pulmonary disease, unspecified: Secondary | ICD-10-CM | POA: Diagnosis not present

## 2023-06-20 ENCOUNTER — Encounter: Payer: Self-pay | Admitting: Internal Medicine

## 2023-06-20 NOTE — Progress Notes (Signed)
 Subjective:    Patient ID: Jonathan [name], male    DOB: May 03, 1933, 89 y.o.   MRN: [id number]     HPI Jonathan [name] is here for follow up of his chronic medical problems.    Medications and allergies reviewed with patient and updated if appropriate.  Current Outpatient Medications on File Prior to Visit  Medication Sig Dispense Refill   acetaminophen  (TYLENOL ) 650 MG CR tablet Take 650 mg by mouth in the morning and at bedtime.     allopurinol  (ZYLOPRIM ) 300 MG tablet TAKE 1 TABLET (300 MG TOTAL) BY MOUTH EVERY EVENING. FOR GOUT 90 tablet 3   amiodarone  (PACERONE ) 200 MG tablet TAKE 1 TABLET EVERY DAY 90 tablet 1   amLODipine  (NORVASC ) 5 MG tablet TAKE 1 TABLET (5 MG TOTAL) BY MOUTH DAILY. 90 tablet 2   atorvastatin  (LIPITOR) 20 MG tablet TAKE 1 TABLET BY MOUTH EVERY DAY FOR CHOLESTEROL 90 tablet 1   Calcium  Citrate-Vitamin D  (CALCIUM  + D PO) Take 1 tablet by mouth daily.     carboxymethylcellulose (REFRESH PLUS) 0.5 % SOLN Place 1 drop into both eyes 3 (three) times daily as needed (dry eyes).     Emollient (RA RENEWAL DARK SPOT CORRECTOR EX) Apply 1 application topically daily as needed (dark spots).     FARXIGA  10 MG TABS tablet TAKE 1 TABLET (10 MG TOTAL) BY MOUTH DAILY BEFORE BREAKFAST. FOR DIABETES 30 tablet 5   FLUoxetine  (PROZAC ) 40 MG capsule Take 1 capsule (40 mg total) by mouth daily. 90 capsule 1   Fluticasone  Propionate (ALLERGY RELIEF NA) Place 2 sprays into the nose daily as needed (allergies).     furosemide  (LASIX ) 40 MG tablet Take 40 mg by mouth daily.     hydrochlorothiazide  (HYDRODIURIL ) 12.5 MG tablet TAKE 1 TABLET BY MOUTH EVERY DAY 90 tablet 1   Lancets MISC Use to check blood sugars daily. E11.9 100 each 3   latanoprost  (XALATAN ) 0.005 % ophthalmic solution Place 1 drop into both eyes at bedtime.     levothyroxine  (SYNTHROID ) 150 MCG tablet TAKE 1 TABLET BY MOUTH EVERY DAY 90 tablet 1   Lido-Menthol-Methyl Sal-Camph (CBD KINGS EX) Apply 1 Application  topically as needed (pain).     Melatonin 5 MG TABS Take 5 mg by mouth at bedtime.     mirabegron  ER (MYRBETRIQ ) 25 MG TB24 tablet      Multiple Vitamin (MULTIVITAMIN) tablet Take 1 tablet by mouth daily.     nitroGLYCERIN  (NITROSTAT ) 0.4 MG SL tablet Place 1 tablet (0.4 mg total) under the tongue every 5 (five) minutes as needed for chest pain. 90 tablet 3   omeprazole  (PRILOSEC) 20 MG capsule TAKE 1 CAPSULE BY MOUTH EVERY DAY 90 capsule 1   oxyCODONE -acetaminophen  (PERCOCET) 10-325 MG tablet Take 1 tablet by mouth every 8 (eight) hours as needed for pain.     pregabalin  (LYRICA ) 75 MG capsule TAKE 1 CAPSULE BY MOUTH TWICE A DAY 180 capsule 0   Probiotic Product (PROBIOTIC PO) Take 1 capsule by mouth daily.     RESTASIS  0.05 % ophthalmic emulsion Place 1 drop into both eyes 2 (two) times daily.      Semaglutide  (RYBELSUS ) 7 MG TABS Take 7 mg by mouth daily. Via [employer] pt assistance 30 tablet 3   sildenafil (REVATIO) 20 MG tablet Take 40-100 mg by mouth daily as needed.     silodosin (RAPAFLO) 8 MG CAPS capsule Take 8 mg by mouth daily.  telmisartan  (MICARDIS ) 40 MG tablet TAKE 1 TABLET BY MOUTH EVERY DAY 90 tablet 2   Vibegron (GEMTESA) 75 MG TABS Take 75 mg by mouth daily.     vitamin C  (ASCORBIC ACID ) 500 MG tablet Take 500 mg by mouth daily.     VITAMIN D , CHOLECALCIFEROL , PO Take 1 capsule by mouth daily.     No current facility-administered medications on file prior to visit.     Review of Systems  Constitutional:  Negative for fever.  Respiratory:  Negative for cough, shortness of breath and wheezing.   Cardiovascular:  Positive for leg swelling (LLE ankle swelling at end of day). Negative for chest pain and palpitations.  Neurological:  Negative for light-headedness and headaches.       Objective:   Vitals:   06/21/23 0959  BP: 120/60  Pulse: (!) 55  Temp: 97.6 F (36.4 C)  SpO2: 94%   BP Readings from Last 3 Encounters:  06/21/23 120/60  06/10/23 (!) 128/54   02/16/23 (!) 114/58   Wt Readings from Last 3 Encounters:  06/21/23 189 lb (85.7 kg)  06/10/23 185 lb (83.9 kg)  05/05/23 195 lb (88.5 kg)   Body mass index is 30.51 kg/m.    Physical Exam Constitutional:      General: He is not in acute distress.    Appearance: Normal appearance. He is not ill-appearing.  HENT:     Head: Normocephalic and atraumatic.  Eyes:     Conjunctiva/sclera: Conjunctivae normal.  Cardiovascular:     Rate and Rhythm: Normal rate and regular rhythm.     Heart sounds: Normal heart sounds.  Pulmonary:     Effort: Pulmonary effort is normal. No respiratory distress.     Breath sounds: Normal breath sounds. No wheezing or rales.  Musculoskeletal:     Right lower leg: No edema.     Left lower leg: No edema.  Skin:    General: Skin is warm and dry.     Findings: No rash.  Neurological:     Mental Status: He is alert. Mental status is at baseline.  Psychiatric:        Mood and Affect: Mood normal.       Diabetic Foot Exam - Simple   Simple Foot Form Diabetic Foot exam was performed with the following findings: Yes 06/21/2023 10:35 AM  Visual Inspection No deformities, no ulcerations, no other skin breakdown bilaterally: Yes Sensation Testing Intact to touch and monofilament testing bilaterally: Yes Pulse Check Posterior Tibialis and Dorsalis pulse intact bilaterally: Yes Comments Onychomycosis b/l first toenails, mild dry skin      Lab Results  Component Value Date   WBC 16.2 (H) 06/10/2023   HGB 12.4 (L) 06/10/2023   HCT 38.8 (L) 06/10/2023   PLT 58 (L) 06/10/2023   GLUCOSE 102 (H) 06/10/2023   CHOL 69 11/19/2022   TRIG 72.0 11/19/2022   HDL 30.00 (L) 11/19/2022   LDLDIRECT 51.0 08/14/2019   LDLCALC 25 11/19/2022   ALT 35 11/19/2022   AST 27 11/19/2022   NA 142 06/10/2023   K 3.4 (L) 06/10/2023   CL 109 06/10/2023   CREATININE 1.36 (H) 06/10/2023   BUN 26 (H) 06/10/2023   CO2 22 06/10/2023   TSH 4.81 11/19/2022   INR 1.1  06/10/2023   HGBA1C 6.2 11/19/2022   MICROALBUR 51.6 (H) 07/09/2021     Assessment & Plan:    See Problem List for Assessment and Plan of chronic medical problems.

## 2023-06-20 NOTE — Patient Instructions (Addendum)
      Blood work was ordered.       Medications changes include :   None     Return in about 6 months (around 12/19/2023) for Physical Exam.

## 2023-06-21 ENCOUNTER — Ambulatory Visit (INDEPENDENT_AMBULATORY_CARE_PROVIDER_SITE_OTHER): Payer: Medicare HMO | Admitting: Internal Medicine

## 2023-06-21 VITALS — BP 120/60 | HR 55 | Temp 97.6°F | Ht 66.0 in | Wt 189.0 lb

## 2023-06-21 DIAGNOSIS — I4819 Other persistent atrial fibrillation: Secondary | ICD-10-CM | POA: Diagnosis not present

## 2023-06-21 DIAGNOSIS — G4700 Insomnia, unspecified: Secondary | ICD-10-CM

## 2023-06-21 DIAGNOSIS — E782 Mixed hyperlipidemia: Secondary | ICD-10-CM

## 2023-06-21 DIAGNOSIS — G609 Hereditary and idiopathic neuropathy, unspecified: Secondary | ICD-10-CM | POA: Diagnosis not present

## 2023-06-21 DIAGNOSIS — D696 Thrombocytopenia, unspecified: Secondary | ICD-10-CM

## 2023-06-21 DIAGNOSIS — F411 Generalized anxiety disorder: Secondary | ICD-10-CM | POA: Diagnosis not present

## 2023-06-21 DIAGNOSIS — K219 Gastro-esophageal reflux disease without esophagitis: Secondary | ICD-10-CM

## 2023-06-21 DIAGNOSIS — E1142 Type 2 diabetes mellitus with diabetic polyneuropathy: Secondary | ICD-10-CM

## 2023-06-21 DIAGNOSIS — F3289 Other specified depressive episodes: Secondary | ICD-10-CM | POA: Diagnosis not present

## 2023-06-21 DIAGNOSIS — Z794 Long term (current) use of insulin: Secondary | ICD-10-CM

## 2023-06-21 DIAGNOSIS — E039 Hypothyroidism, unspecified: Secondary | ICD-10-CM

## 2023-06-21 DIAGNOSIS — M109 Gout, unspecified: Secondary | ICD-10-CM

## 2023-06-21 DIAGNOSIS — I10 Essential (primary) hypertension: Secondary | ICD-10-CM

## 2023-06-21 DIAGNOSIS — M797 Fibromyalgia: Secondary | ICD-10-CM | POA: Diagnosis not present

## 2023-06-21 LAB — COMPREHENSIVE METABOLIC PANEL
ALT: 16 U/L (ref 0–53)
AST: 19 U/L (ref 0–37)
Albumin: 4.5 g/dL (ref 3.5–5.2)
Alkaline Phosphatase: 105 U/L (ref 39–117)
BUN: 19 mg/dL (ref 6–23)
CO2: 29 meq/L (ref 19–32)
Calcium: 9.5 mg/dL (ref 8.4–10.5)
Chloride: 105 meq/L (ref 96–112)
Creatinine, Ser: 1.25 mg/dL (ref 0.40–1.50)
GFR: 51.58 mL/min — ABNORMAL LOW (ref >60.00)
Glucose, Bld: 103 mg/dL — ABNORMAL HIGH (ref 70–99)
Potassium: 4.8 meq/L (ref 3.5–5.1)
Sodium: 143 meq/L (ref 135–145)
Total Bilirubin: 0.6 mg/dL (ref 0.2–1.2)
Total Protein: 6.4 g/dL (ref 6.0–8.3)

## 2023-06-21 LAB — CBC WITH DIFFERENTIAL/PLATELET
Basophils Absolute: 0 10*3/uL (ref 0.0–0.1)
Basophils Relative: 0.3 % (ref 0.0–3.0)
Eosinophils Absolute: 0 10*3/uL (ref 0.0–0.7)
Eosinophils Relative: 0.2 % (ref 0.0–5.0)
HCT: 41 % (ref 39.0–52.0)
Hemoglobin: 13.2 g/dL (ref 13.0–17.0)
Lymphocytes Relative: 12.1 % (ref 12.0–46.0)
Lymphs Abs: 1.4 10*3/uL (ref 0.7–4.0)
MCHC: 32.1 g/dL (ref 30.0–36.0)
MCV: 90.1 fL (ref 78.0–100.0)
Monocytes Absolute: 4 10*3/uL — ABNORMAL HIGH (ref 0.1–1.0)
Monocytes Relative: 35.7 % — ABNORMAL HIGH (ref 3.0–12.0)
Neutro Abs: 5.8 10*3/uL (ref 1.4–7.7)
Neutrophils Relative %: 51.7 % (ref 43.0–77.0)
Platelets: 63 10*3/uL — ABNORMAL LOW (ref 150.0–400.0)
RBC: 4.55 Mil/uL (ref 4.22–5.81)
RDW: 15.8 % — ABNORMAL HIGH (ref 11.5–15.5)
WBC: 11.2 10*3/uL — ABNORMAL HIGH (ref 4.0–10.5)

## 2023-06-21 LAB — LIPID PANEL
Cholesterol: 101 mg/dL (ref 0–200)
HDL: 30.9 mg/dL — ABNORMAL LOW (ref >39.00)
LDL Cholesterol: 53 mg/dL (ref 0–99)
NonHDL: 69.79
Total CHOL/HDL Ratio: 3
Triglycerides: 83 mg/dL (ref 0.0–149.0)
VLDL: 16.6 mg/dL (ref 0.0–40.0)

## 2023-06-21 LAB — TSH: TSH: 6.19 u[IU]/mL — ABNORMAL HIGH (ref 0.35–5.50)

## 2023-06-21 LAB — HEMOGLOBIN A1C: Hgb A1c MFr Bld: 6.6 % — ABNORMAL HIGH (ref 4.6–6.5)

## 2023-06-21 MED ORDER — FLUOXETINE HCL 40 MG PO CAPS: 40.0000 mg | ORAL_CAPSULE | Freq: Every day | ORAL | 1 refills | Status: AC

## 2023-06-21 MED ORDER — DAPAGLIFLOZIN PROPANEDIOL 10 MG PO TABS: ORAL_TABLET | ORAL | 2 refills | Status: DC

## 2023-06-21 MED ORDER — PREGABALIN 75 MG PO CAPS: 75.0000 mg | ORAL_CAPSULE | Freq: Two times a day (BID) | ORAL | 0 refills | Status: DC

## 2023-06-21 MED ORDER — LEVOTHYROXINE SODIUM 150 MCG PO TABS: ORAL_TABLET | ORAL | 1 refills | Status: DC

## 2023-06-21 MED ORDER — OMEPRAZOLE 20 MG PO CPDR: DELAYED_RELEASE_CAPSULE | ORAL | 1 refills | Status: DC

## 2023-06-21 NOTE — Assessment & Plan Note (Signed)
Chronic Controlled Continue Lyrica 75 mg twice daily 

## 2023-06-21 NOTE — Assessment & Plan Note (Signed)
 Chronic GERD controlled - denies gerd Taking omeprazole 20 mg daily

## 2023-06-21 NOTE — Assessment & Plan Note (Signed)
Chronic  Clinically euthyroid Check tsh and will titrate med dose if needed Currently taking levothyroxine 150 mcg daily 

## 2023-06-21 NOTE — Assessment & Plan Note (Signed)
Chronic °Regular exercise and healthy diet encouraged °Check lipid panel  °Continue atorvastatin 20 mg daily °

## 2023-06-21 NOTE — Assessment & Plan Note (Signed)
 Chronic Blood pressure controlled here today Continue amlodipine 5 mg daily, hydrochlorothiazide 12.5 mg daily and telmisartan 40 mg daily cmp

## 2023-06-21 NOTE — Assessment & Plan Note (Signed)
 Chronic Has been stable Following with heme-onc

## 2023-06-21 NOTE — Assessment & Plan Note (Signed)
 Chronic Has been on medication for his insomnia in the past and did have recurrent falls 1 resulting in a subdural hematoma Continue melatonin Stressed continuing regular exercise He does get up and read when he is not able to get back to sleep Advised avoiding naps during the day

## 2023-06-21 NOTE — Assessment & Plan Note (Signed)
Chronic Denies gout flares Continue allopurinol 300 mg daily 

## 2023-06-21 NOTE — Assessment & Plan Note (Signed)
 Chronic Lab Results  Component Value Date   HGBA1C 6.2 11/19/2022   Sugars controlled Check A1c Stressed low sugar/carbohydrate diet Increase exercise Continue Farxiga 10 mg daily, Rybelsus 7 mg daily

## 2023-06-21 NOTE — Assessment & Plan Note (Addendum)
 Chronic Following with cardiology On amiodarone 200 mg daily No longer on Eliquis secondary to recurrent falls, SDH

## 2023-06-21 NOTE — Assessment & Plan Note (Signed)
 Chronic Controlled, Stable Continue fluoxetine 40 mg daily

## 2023-06-21 NOTE — Assessment & Plan Note (Signed)
 Chronic Stressed regular exercise At his last visit we restarted Lyrica-pain improved Continue pregabalin 75 mg twice daily

## 2023-06-21 NOTE — Assessment & Plan Note (Signed)
 Chronic Controlled  Continue fluoxetine to 40 mg daily

## 2023-06-22 MED ORDER — LEVOTHYROXINE SODIUM 175 MCG PO CAPS: 175.0000 ug | ORAL_CAPSULE | Freq: Every day | ORAL | 1 refills | Status: DC

## 2023-06-22 NOTE — Addendum Note (Signed)
 Addended by: Pincus Sanes on: 06/22/2023 09:13 PM   Modules accepted: Orders

## 2023-06-23 ENCOUNTER — Other Ambulatory Visit: Payer: Self-pay | Admitting: Cardiology

## 2023-06-25 DIAGNOSIS — R338 Other retention of urine: Secondary | ICD-10-CM | POA: Diagnosis not present

## 2023-06-25 DIAGNOSIS — N401 Enlarged prostate with lower urinary tract symptoms: Secondary | ICD-10-CM | POA: Diagnosis not present

## 2023-06-25 DIAGNOSIS — R3914 Feeling of incomplete bladder emptying: Secondary | ICD-10-CM | POA: Diagnosis not present

## 2023-07-12 ENCOUNTER — Ambulatory Visit (INDEPENDENT_AMBULATORY_CARE_PROVIDER_SITE_OTHER): Payer: PPO

## 2023-07-12 DIAGNOSIS — R55 Syncope and collapse: Secondary | ICD-10-CM | POA: Diagnosis not present

## 2023-07-12 LAB — CUP PACEART REMOTE DEVICE CHECK
Date Time Interrogation Session: 20250126231556
Implantable Pulse Generator Implant Date: 20230612

## 2023-07-14 ENCOUNTER — Ambulatory Visit: Payer: Medicare HMO | Admitting: Physician Assistant

## 2023-07-14 NOTE — Progress Notes (Signed)
[facility] Summary Report / Loop Recorder

## 2023-07-17 ENCOUNTER — Other Ambulatory Visit: Payer: Self-pay | Admitting: Cardiology

## 2023-07-17 DIAGNOSIS — J449 Chronic obstructive pulmonary disease, unspecified: Secondary | ICD-10-CM | POA: Diagnosis not present

## 2023-07-18 ENCOUNTER — Encounter: Payer: Self-pay | Admitting: Internal Medicine

## 2023-07-22 ENCOUNTER — Other Ambulatory Visit (INDEPENDENT_AMBULATORY_CARE_PROVIDER_SITE_OTHER): Payer: Medicare HMO | Admitting: Pharmacist

## 2023-07-22 DIAGNOSIS — E1142 Type 2 diabetes mellitus with diabetic polyneuropathy: Secondary | ICD-10-CM

## 2023-07-22 NOTE — Progress Notes (Addendum)
   07/22/2023 Name: Amarii Bordas MRN: [id number] DOB: [birth year]  Pt referred to me by PCP for help increasing Rybelsus  for PAP delivery and also to apply for Farxiga  PAP.  Rybelsus  dose change form submitted.  Filled out Farxiga  PAP application and received provider signature. Pt gave verbal consent to sign form on his behalf. Faxed application to [employer]  Patient called and left VM 2/7 stating he received notification that his Farxiga  PAP was approved  Darrelyn Drum, PharmD, BCPS, CPP Clinical Pharmacist Practitioner [facility] [phone]

## 2023-08-14 DIAGNOSIS — J449 Chronic obstructive pulmonary disease, unspecified: Secondary | ICD-10-CM | POA: Diagnosis not present

## 2023-08-16 ENCOUNTER — Inpatient Hospital Stay: Payer: Medicare HMO | Attending: Oncology | Admitting: Oncology

## 2023-08-16 ENCOUNTER — Ambulatory Visit (INDEPENDENT_AMBULATORY_CARE_PROVIDER_SITE_OTHER): Payer: PPO

## 2023-08-16 ENCOUNTER — Inpatient Hospital Stay: Payer: Medicare HMO

## 2023-08-16 ENCOUNTER — Other Ambulatory Visit: Payer: Medicare HMO

## 2023-08-16 VITALS — BP 124/60 | HR 60 | Temp 98.1°F | Resp 18 | Ht 66.0 in | Wt 188.3 lb

## 2023-08-16 DIAGNOSIS — H409 Unspecified glaucoma: Secondary | ICD-10-CM | POA: Insufficient documentation

## 2023-08-16 DIAGNOSIS — D649 Anemia, unspecified: Secondary | ICD-10-CM | POA: Insufficient documentation

## 2023-08-16 DIAGNOSIS — R55 Syncope and collapse: Secondary | ICD-10-CM

## 2023-08-16 DIAGNOSIS — E039 Hypothyroidism, unspecified: Secondary | ICD-10-CM | POA: Insufficient documentation

## 2023-08-16 DIAGNOSIS — G4730 Sleep apnea, unspecified: Secondary | ICD-10-CM | POA: Insufficient documentation

## 2023-08-16 DIAGNOSIS — G8929 Other chronic pain: Secondary | ICD-10-CM | POA: Insufficient documentation

## 2023-08-16 DIAGNOSIS — M109 Gout, unspecified: Secondary | ICD-10-CM | POA: Diagnosis not present

## 2023-08-16 DIAGNOSIS — F419 Anxiety disorder, unspecified: Secondary | ICD-10-CM | POA: Insufficient documentation

## 2023-08-16 DIAGNOSIS — E1140 Type 2 diabetes mellitus with diabetic neuropathy, unspecified: Secondary | ICD-10-CM | POA: Insufficient documentation

## 2023-08-16 DIAGNOSIS — D696 Thrombocytopenia, unspecified: Secondary | ICD-10-CM

## 2023-08-16 DIAGNOSIS — I10 Essential (primary) hypertension: Secondary | ICD-10-CM | POA: Insufficient documentation

## 2023-08-16 DIAGNOSIS — M797 Fibromyalgia: Secondary | ICD-10-CM | POA: Diagnosis not present

## 2023-08-16 DIAGNOSIS — I4891 Unspecified atrial fibrillation: Secondary | ICD-10-CM | POA: Diagnosis not present

## 2023-08-16 DIAGNOSIS — M549 Dorsalgia, unspecified: Secondary | ICD-10-CM | POA: Diagnosis not present

## 2023-08-16 LAB — CBC WITH DIFFERENTIAL (CANCER CENTER ONLY)
Abs Immature Granulocytes: 0.5 10*3/uL — ABNORMAL HIGH (ref 0.00–0.07)
Basophils Absolute: 0 10*3/uL (ref 0.0–0.1)
Basophils Relative: 0 %
Eosinophils Absolute: 0 10*3/uL (ref 0.0–0.5)
Eosinophils Relative: 0 %
HCT: 38.4 % — ABNORMAL LOW (ref 39.0–52.0)
Hemoglobin: 12.2 g/dL — ABNORMAL LOW (ref 13.0–17.0)
Immature Granulocytes: 4 %
Lymphocytes Relative: 16 %
Lymphs Abs: 1.8 10*3/uL (ref 0.7–4.0)
MCH: 29.5 pg (ref 26.0–34.0)
MCHC: 31.8 g/dL (ref 30.0–36.0)
MCV: 92.8 fL (ref 80.0–100.0)
Monocytes Absolute: 4.5 10*3/uL — ABNORMAL HIGH (ref 0.1–1.0)
Monocytes Relative: 38 %
Neutro Abs: 4.8 10*3/uL (ref 1.7–7.7)
Neutrophils Relative %: 42 %
Platelet Count: 62 10*3/uL — ABNORMAL LOW (ref 150–400)
RBC: 4.14 MIL/uL — ABNORMAL LOW (ref 4.22–5.81)
RDW: 16 % — ABNORMAL HIGH (ref 11.5–15.5)
WBC Count: 11.7 10*3/uL — ABNORMAL HIGH (ref 4.0–10.5)
nRBC: 0 % (ref 0.0–0.2)

## 2023-08-16 LAB — CUP PACEART REMOTE DEVICE CHECK
Date Time Interrogation Session: 20250302231201
Implantable Pulse Generator Implant Date: 20230612

## 2023-08-16 NOTE — Progress Notes (Signed)
  [facility] OFFICE PROGRESS NOTE   Diagnosis: Thrombocytopenia  INTERVAL HISTORY:   Mr. Zinni returns as scheduled.  He has bruises over the arms.  No other bleeding.  He has chronic back pain.  He has 2 alcoholic drinks daily.  Objective:  Vital signs in last 24 hours:  Blood pressure 124/60, pulse 60, temperature 98.1 F (36.7 C), temperature source Temporal, resp. rate 18, height 5\' 6"  (1.676 m), weight 188 lb 4.8 oz (85.4 kg), SpO2 98%.    Lymphatics: No cervical, supraclavicular, or inguinal nodes.  Soft mobile 1-2 cm left axillary node versus a prominent fat pad Resp: Distant breath sounds, end inspiratory rhonchi at the posterior base bilaterally, no respiratory distress Cardio: Regular rate and rhythm GI: No hepatosplenomegaly Vascular: No leg edema  Skin: Few small ecchymoses over the lower arms, brown discoloration at the pretibial area bilaterally   Lab Results:  Lab Results  Component Value Date   WBC 11.2 (H) 06/21/2023   HGB 13.2 06/21/2023   HCT 41.0 06/21/2023   MCV 90.1 06/21/2023   PLT 63.0 (L) 06/21/2023   NEUTROABS 5.8 06/21/2023    CMP  Lab Results  Component Value Date   NA 143 06/21/2023   K 4.8 06/21/2023   CL 105 06/21/2023   CO2 29 06/21/2023   GLUCOSE 103 (H) 06/21/2023   BUN 19 06/21/2023   CREATININE 1.25 06/21/2023   CALCIUM 9.5 06/21/2023   PROT 6.4 06/21/2023   ALBUMIN 4.5 06/21/2023   AST 19 06/21/2023   ALT 16 06/21/2023   ALKPHOS 105 06/21/2023   BILITOT 0.6 06/21/2023   GFRNONAA 50 (L) 06/10/2023   GFRAA >60 03/14/2020     Medications: I have reviewed the patient's current medications.   Assessment/Plan: Thrombocytopenia-chronic   Atrial fibrillation-apixaban discontinued May 2023 due to recurrent falls/syncope Hypertension Fibromyalgia Diabetes Hypothyroidism Anxiety Gout Neuropathy Sleep apnea-CPAP Glaucoma Mild elevation of monocyte count Small cutaneous cyst at the perineum Left  facial abscess, status post I&D March 2024 Mild normocytic anemia-chronic       Disposition: Mr. Valdez appears stable.  He has stable mild anemia and moderate thrombocytopenia.  The mild elevation of the white count may be related to steroid injections. The anemia/thrombocytopenia could be related to early myelodysplasia or unrecognized liver disease.  We discussed a diagnostic bone marrow biopsy.  He does not wish to go bone marrow biopsy at present.  He will return for an office and lab visit in 6 months.  He will call for spontaneous bleeding or bruising.   Thornton Papas, MD  08/16/2023  12:49 PM

## 2023-08-19 NOTE — Progress Notes (Signed)
 [facility] Summary Report / Loop Recorder

## 2023-08-26 IMAGING — MR MR HEAD W/O CM
12 series · 48 of 48 positions shown · non-contrast
Comparison: MRA head and neck 09/24/2021.  Brain MRI 09/04/2021.

CLINICAL DATA: Provided history: Transient ischemic attack. Memory
loss, TIA. Additional history provided by scanning technologist:
Recent fall (hitting head).

EXAM:
MRI HEAD WITHOUT CONTRAST
TECHNIQUE: Multiplanar, multiecho pulse sequences of the brain and surrounding
structures were obtained without intravenous contrast.

[Series 5: T1 · sagittal · 4.0mm · 0.75mm/px · 2 of 31 slices shown (1 of 2)]
[im 1/31]
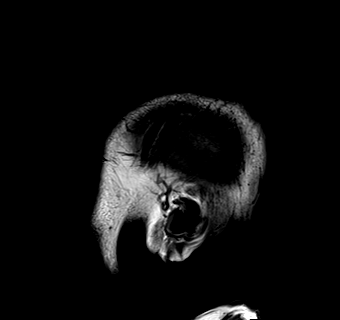
[im 31/31]
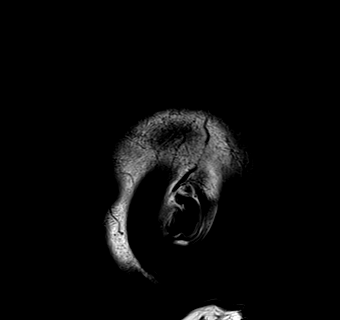

[Series 6: DWI · axial · 3.0mm · 0.94mm/px · z∈[-75,+69]mm · 9 of 164 slices shown (1 of 3)]
[im 1/164]
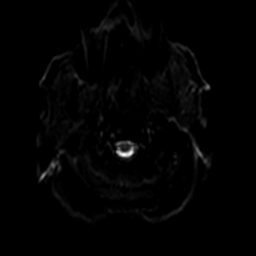
[im 21/164]
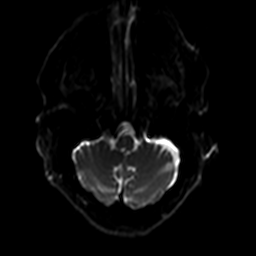
[im 41/164]
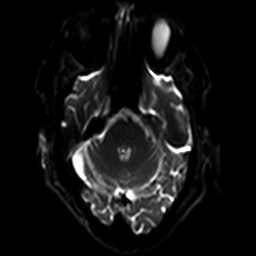
[im 62/164]
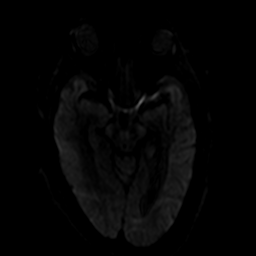
[im 82/164]
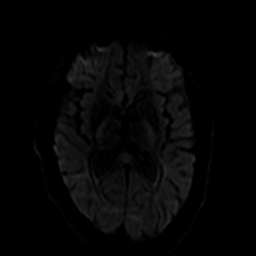
[im 102/164]
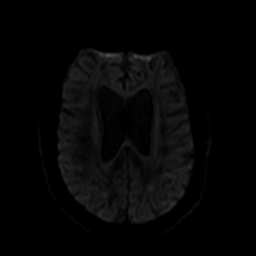
[im 123/164]
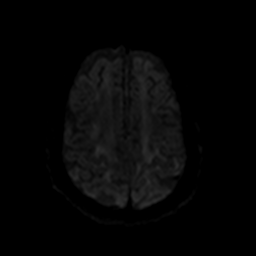
[im 143/164]
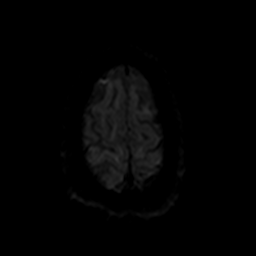
[im 164/164]
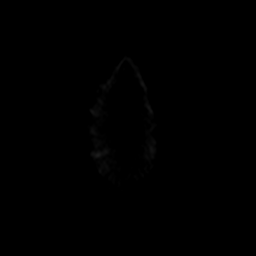

[Series 7: ax dwi_tracew · axial · 3.0mm · 0.94mm/px · z∈[-75,+69]mm · 5 of 82 slices shown]
[im 1/82]
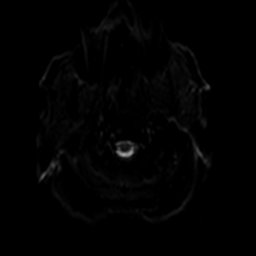
[im 21/82]
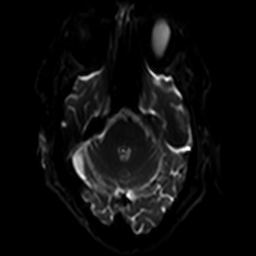
[im 41/82]
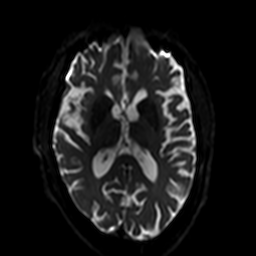
[im 61/82]
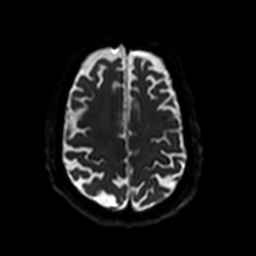
[im 82/82]
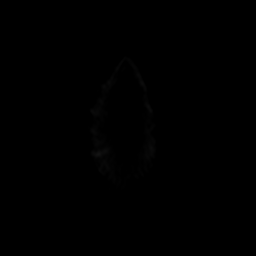

[Series 8: ax dwi_adc · axial · 3.0mm · 0.94mm/px · z∈[-75,+69]mm · 2 of 39 slices shown]
[im 1/39]
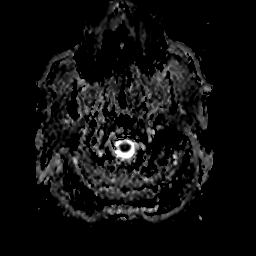
[im 39/39]
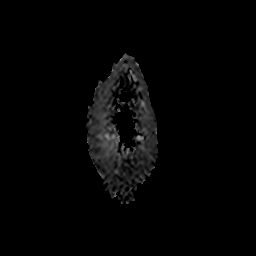

[Series 9: DWI · coronal · 5.0mm · 1.44mm/px · 4 of 70 slices shown (2 of 3)]
[im 1/70]
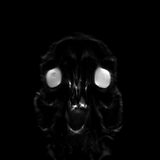
[im 24/70]
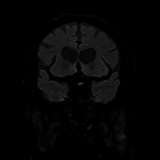
[im 47/70]
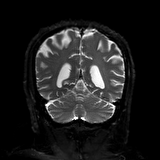
[im 70/70]
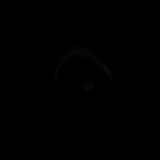

[Series 10: DWI · coronal · 5.0mm · 1.44mm/px · 2 of 35 slices shown (3 of 3)]
[im 1/35]
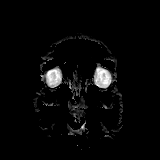
[im 35/35]
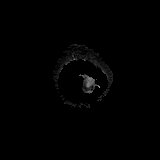

[Series 11: T2 · axial · 4.0mm · 0.36mm/px · z∈[-68,+67]mm · 2 of 27 slices shown (1 of 2)]
[im 1/27]
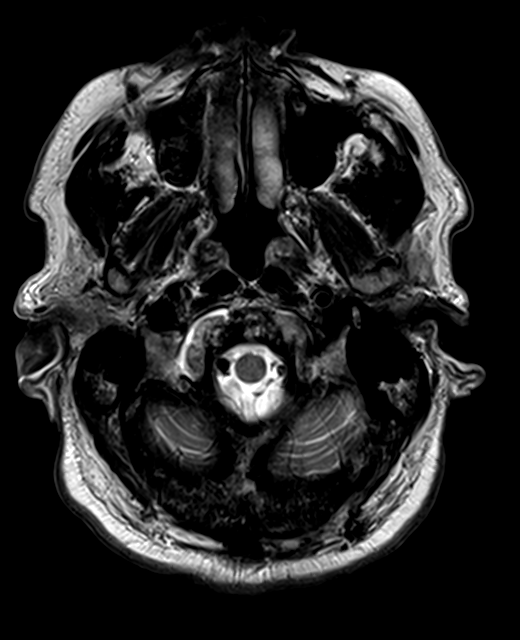
[im 27/27]
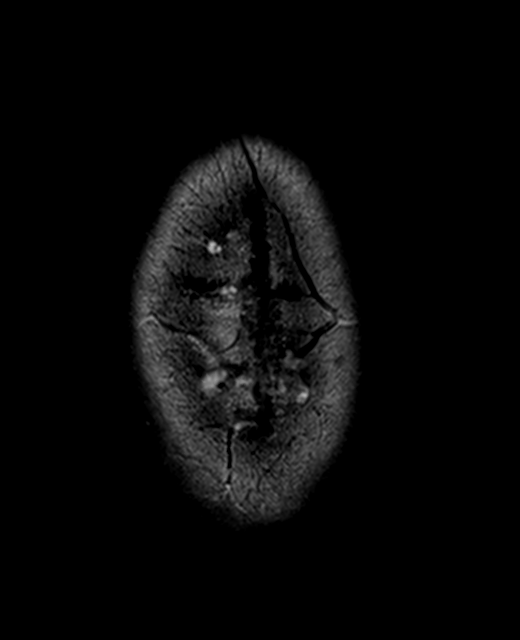

[Series 12: FLAIR · axial · 3.0mm · 0.72mm/px · 1 of 26 slices shown]
[im 1/26]
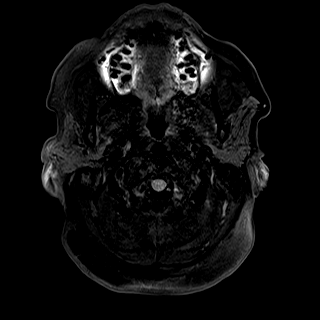

[Series 13: mip_images(sw) · axial · 12.0mm · 0.90mm/px · z∈[-64,+68]mm · 5 of 89 slices shown]
[im 1/89]
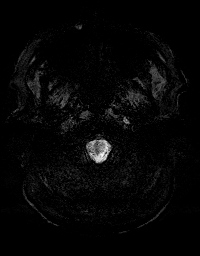
[im 23/89]
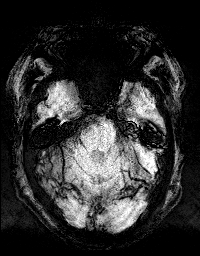
[im 45/89]
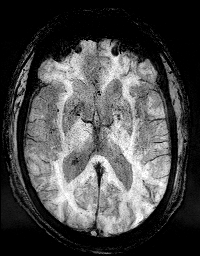
[im 67/89]
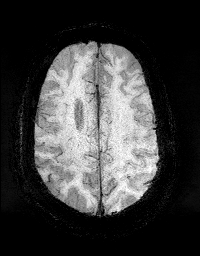
[im 89/89]
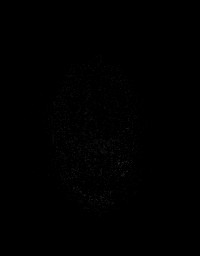

[Series 14: swi_images · axial · 1.5mm · 0.90mm/px · z∈[-69,+73]mm · 6 of 96 slices shown]
[im 1/96]
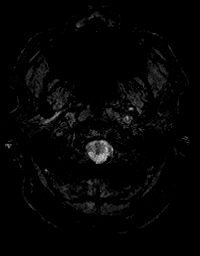
[im 20/96]
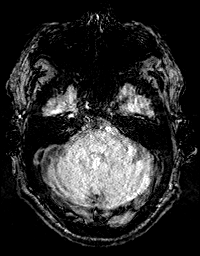
[im 39/96]
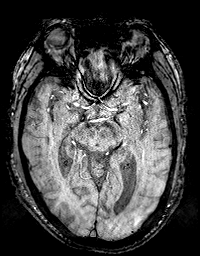
[im 58/96]
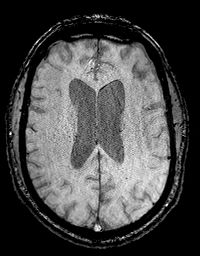
[im 77/96]
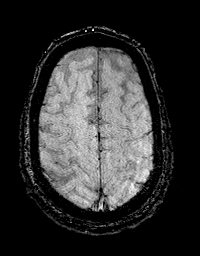
[im 96/96]
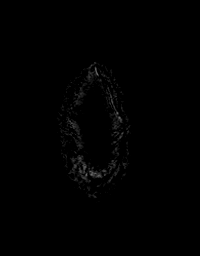

[Series 15: T1 · axial · 1.0mm · 0.94mm/px · z∈[-69,+74]mm · 8 of 144 slices shown (2 of 2)]
[im 1/144]
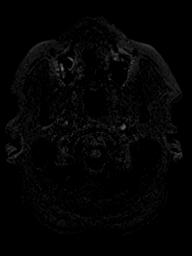
[im 21/144]
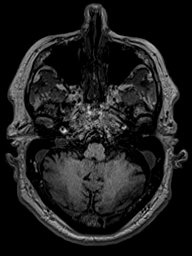
[im 41/144]
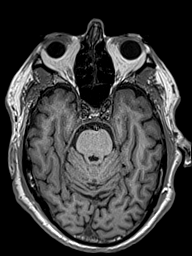
[im 62/144]
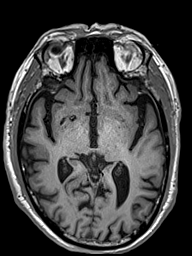
[im 82/144]
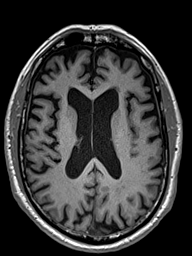
[im 103/144]
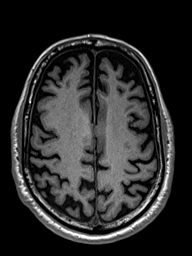
[im 123/144]
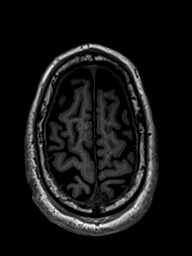
[im 144/144]
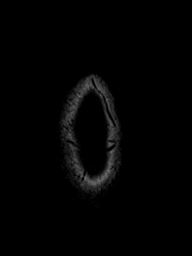

[Series 16: T2 · coronal · 4.5mm · 0.36mm/px · 2 of 33 slices shown (2 of 2)]
[im 1/33]
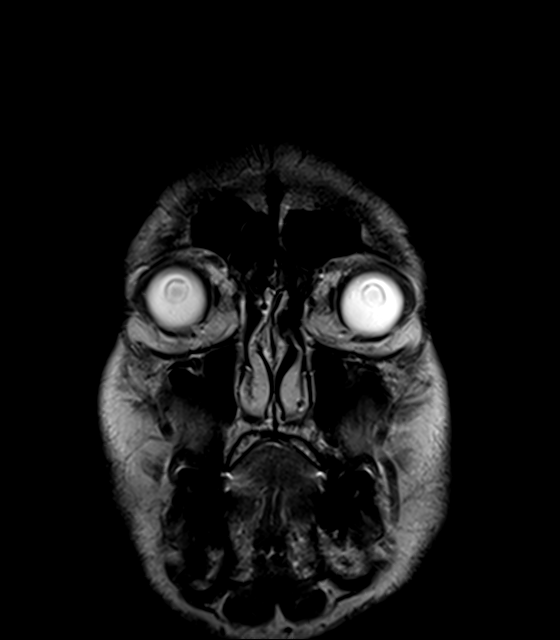
[im 33/33]
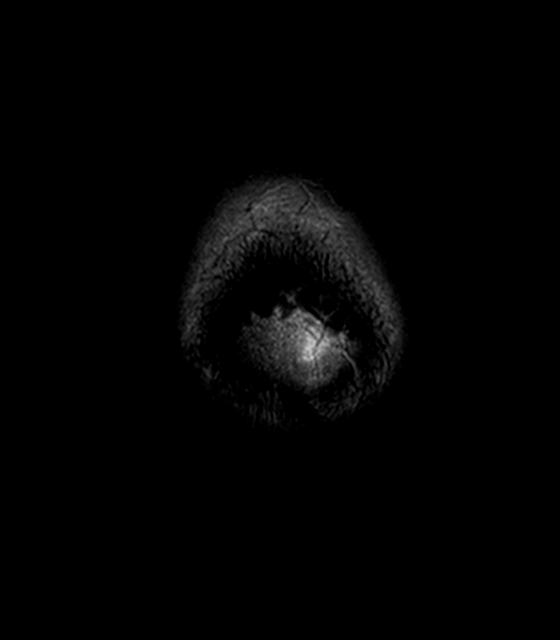

[48 of 48 positions shown; findings below may reference images not displayed]

FINDINGS: Brain:

Mild-to-moderate cerebral atrophy without appreciable lobar
predominance. Comparatively mild cerebellar atrophy.

Minimal multifocal T2 FLAIR hyperintense signal abnormality within
the cerebral white matter, nonspecific but compatible chronic small
vessel ischemic disease.

There is no acute infarct.

No evidence of an intracranial mass.

No chronic intracranial blood products.

No extra-axial fluid collection.

No midline shift.

Vascular: Known occlusion of the intracranial left vertebral artery.
Flow voids otherwise preserved within the proximal large arterial
vessels.

Skull and upper cervical spine: No focal suspicious marrow lesion.

Sinuses/Orbits: No mass or acute finding within the imaged orbits.
Prior bilateral ocular lens replacement. 9 mm mucous retention cyst
within the right sphenoid sinus. Minimal mucosal thickening
scattered within the bilateral ethmoid air cells.
IMPRESSION: No evidence of acute intracranial abnormality.

Minimal chronic small vessel ischemic changes within the cerebral
white matter, stable from the recent prior brain MRI of 09/04/2021.

Mild-to-moderate cerebral atrophy. Comparatively mild cerebellar
atrophy.

Known occlusion of the intracranial left vertebral artery.

Paranasal sinus disease, as described.

## 2023-08-31 ENCOUNTER — Encounter: Payer: Self-pay | Admitting: Physician Assistant

## 2023-08-31 ENCOUNTER — Ambulatory Visit: Payer: Medicare HMO | Admitting: Physician Assistant

## 2023-08-31 VITALS — BP 126/54 | HR 66 | Ht 66.0 in | Wt 186.0 lb

## 2023-08-31 DIAGNOSIS — G3184 Mild cognitive impairment, so stated: Secondary | ICD-10-CM | POA: Diagnosis not present

## 2023-08-31 DIAGNOSIS — R9389 Abnormal findings on diagnostic imaging of other specified body structures: Secondary | ICD-10-CM

## 2023-08-31 MED ORDER — MEMANTINE HCL 5 MG PO TABS: ORAL_TABLET | ORAL | 11 refills | Status: DC

## 2023-08-31 NOTE — Progress Notes (Signed)
 Assessment/Plan:   Mild cognitive impairment of unclear etiology   Jonathan [name] is a very pleasant 89 y.o. RH male with a history of A-fib (not on anticoagulation due to recurrent falls and syncopal episodes), hypertension, hyperlipidemia, fibromyalgia, DM2, hypothyroidism, anxiety, gout, peripheral neuropathy, OSA on CPAP, glaucoma, chronic mild normocytic anemia and mild elevation of monocytes count (followed at the cancer center), chronic thrombocytopenia, mild cognitive impairment of unclear etiology presenting today in follow-up for evaluation of memory loss. Patient is not on antidementia meds at this time. However, given his vascular history including moderate to severe carotid artery disease,, vascular component to memory issues needs to be considered. MMSE today is 30/30.Discussed starting memantine 5 mg bid, patient agrees to proceed.       Recommendations:   Follow up in  6 months. Recommend good control of cardiovascular risk factors, follow with Cards Continue to control mood as per PCP Continue CPAP for OSA Alcohol cessation counseled Recommend psychotherapy for MDD.   Symptomatic carotid artery disease  Abnormal MRA   B Common carotid and innominate angiography, Dr. Corliss Skains 06/10/23, personally reviewed showed  Severe stenosis of the non dominant left vertebral artery at its origin." Approximately 75% stenosis of the superior division of the left middle cerebral artery proximally. Approximately 70% stenosis of the left posterior cerebral artery P1 segment. Approximately 50% stenosis of the right ICA petrous cavernous junction. Approximately 50% stenosis of the dominant right vertebral artery at its origin". Follow up imaging, including CTA Head and Neck was ordered, recommended to patient to schedule to follow up wit IR. He declines doing any further studies .  Recommend discussing with Card restarting anticoagulation ans he may be at a risk for CVA.     Subjective:    This patient is accompanied in the office by his wife  who supplements the history. Previous records as well as any outside records available were reviewed prior to todays visit.   Patient was last seen for memory on 04/20/2022 with a MoCA at the time of 28/30.     Any changes in memory since last visit? "Slightly worse".  LTM is good.  He denies any issues with recent conversations, he may have some difficulty calculating or spelling as before. Likes reading fiction and non fiction. Does not enjoy brain games repeats oneself? Denies Disoriented when walking into a room?  Patient denies    Misplacing objects?  Patient denies   Wandering behavior?   denies   Any personality changes since last visit?   denies   Any worsening depression?:  "Sometimes I feel down "." I feel more depressed"  Hallucinations or paranoia?  Denies Seizures?   Denies.    Any sleep changes? Does not sleep very well, with difficulty going back to sleep, takes melatonin with some relief.  Denies vivid dreams, REM behavior or sleepwalking   Sleep apnea?  Endorsed, uses CPAP  regularly with O2 supplement Any hygiene concerns?   denies   Independent of bathing and dressing?  Endorsed  Does the patient needs help with medications? Patient is in charge, occasionally he may miss a dose.  Who is in charge of the finances?  Patient is in charge.     Any changes in appetite?" May eat a little less, not as hungry"     Patient have trouble swallowing?  Denies.   Does the patient cook?  Occasionally,. Any kitchen accidents such as leaving the stove on?   Denies.   Any headaches?  He  has occasional headaches, but "lasts a second or 2 and then they go away ". Vision changes?   Recently got new glasses, need adjustment Chronic pain?  Endorsed, due to chronic disease. Visits a pain clinic Ambulates with difficulty?  Not walking frequently, does not participate in activities at the facility.   Recent falls or head injuries? Denies       Unilateral weakness, numbness or tingling?   denies   Any tremors?  denies   Any anosmia?  Endorsed, for the last 5 years. Any incontinence of urine?  Endorsed, urge incontinence, follows urology Any bowel dysfunction?  "I have a little incontinence, occasionally over the last 4 years ", has seen GI      Patient lives at a [facility] independent living     Does the patient drive? Yes, "rarely getting lost, I don't have that complete map in my mind anymore" Alcohol? 2 drinks a day (whisky) to help him fall asleep.    Initial visit 07/30/21 The patient is seen in neurologic consultation at the request of Burns, Bobette Mo, MD for the evaluation of memory.  The patient is here alone. [name] is a very pleasant 89 year old man with extensive medical history including type 2 diabetes, atrial fibrillation, hypertension, insomnia, hypothyroidism, hyperlipidemia, gout, chronic back pain, arthritis, GAD, OSA, and undiagnosed depression presenting here for evaluation of memory difficulties.  He had a comprehensive neurological evaluation by Dr. Alinda Dooms in 2018 with normal results, without impaired performances across testing.  At the time, his subjective cognitive difficulties were felt to be likely a combination of various medical and psychiatric conditions.  In 2020, his PCP referred him for repeat neuro testing due to subjective worsening of his memory.  He is Neuropsych evaluation on 02/15/2019 continued to suggest neuropsychological functioning largely within normal limits, perhaps with a slight decline in day-to-day cognitive abilities, included ongoing psychiatric distress, and longstanding traits of ADHD.  Neuroimaging at the time suggested chronic small vessel ischemic changes in the cerebral white matter and a history of subdural hematoma.  Also his memory was affected by medical comorbidities and medication side effects of polypharmacy. On 12/20/2020, he saw Dr. Lawerance Bach in follow-up and he expressed a  desire at that time for repeat neuropsychological evaluation due to subjective cognitive decline.  Extensive testing in July 2022 yielded a diagnosis of mild cognitive impairment, of unclear neurological etiology.   He had reports in today's visit decreased short-term memory, worse remembering details of prior conversations, forgetting to complete started task, and trouble remembering the names of familiar people. He denies any long-term memory issues.  He does report attention and concentration difficulties,never formally diagnosed with ADHD, he reports having been told he had issues with attention since adolescence.  These include trouble maintaining focus, easy distractibility, unable to finish projects, needing to reread passages several times, and instances of impulsivity.  For the last year, he has to write down his task otherwise he will forget.  He reports brain fog episodes.  His mood has moments of depression, he did report that his wife commented on him being more sarcastic than before.  He takes care of her as she is wheelchair-bound and he has to do all the activities alone.  He denies difficulty with understanding or comprehending sentences.  He does have problems with word finding, worse over the last year.  Also, he has some perception issues, "bumping into things such as door frames, especially in the left shoulder, and is working with physical  therapy on this.  He attributes these to peripheral neuropathy and chronic pain, and when in a "zombie state, he drops things ".  He does his own bills, although he may forget to pay some of these bills.  He drives without getting lost.  He denies any recent falls, except in 2019 as he was attempting to wheel his wife down a ramp, without loss of consciousness, but he does acknowledge feeling dazed and confused.  He also had another concussion in 2002.  He did sustain a right parafalcine subdural hematoma with the amount significant mass effect or midline  shift.  Another fall in 2021 resulted in small volume intraventricular hemorrhage that had resolved on repeat imaging.  He denies a history of stroke, but had 2 TIAs in May 2019.  He denies any history of seizures, or toxin exposure.  He denies frequent headaches or migraines.  He denies frequent instances of dizziness or vertigo. Patient uses hearing aids for hearing loss, and a few years prior to that he had lost his sense of taste and smell "way before COVID in 2020 and I had to be in the hospital for like a week".  He sleeps well, gets up at different hours, he needs melatonin 10 mg to sleep well.  In the past, he drank a significant amount of alcohol, but he quit 6 months ago, denies withdrawal symptoms.  He uses a CPAP at night, with an O2 concentrator, which helps him feel more rested in the morning.  When he sleeps, he denies having vivid dreams, hallucinations or paranoia.  He may had some acting out dreams, a lot of them sexual.  For his mood, he tried psychotherapy in the past, which was "ineffective, not doing therapy, I am a therapist myself, and I feel much better ". Family History AD mother. Master's degree in Theology from [employer] in [place], [place].. Daughter in law a local GI MD, son a local Internal Medicine MD     Pertinent imaging   Brain MRI 09/04/21  No evidence of acute intracranial abnormality.Minimal chronic small vessel ischemic changes within the cerebral white matter, stable from the prior MRI of 12/23/2016.Mild-to-moderate generalized cerebral atrophy, slightly progressed. New from the prior MRI, there is signal abnormality within the intracranial left vertebral artery suggesting high-grade stenosis or vessel occlusion.     MRA head and neck  09/24/21 Negative MRA of the neck with wide patency of both carotid artery systems and vertebral arteries. 2. 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 This accounts  for the signal abnormality seen on recent brain MRI. Dominant right vertebral artery remains widely patent. 3. Additional intracranial atherosclerotic disease with mild to moderate stenoses involving the distal left M1 segment, severe proximal left M2 branch stenosis, with a short-segment mild-to-moderate stenosis at the left P1/P2 junction.   B Common carotid and innominate angiography, Dr. Corliss Skains 06/10/23  Severe stenosis of the non dominant left vertebral artery at its origin.  Approximately 75% stenosis of the superior division of the left middle cerebral artery proximally.  Approximately 70% stenosis of the left posterior cerebral artery P1 segment.  Approximately 50% stenosis of the right ICA petrous cavernous junction.  Approximately 50% stenosis of the dominant right vertebral artery at its origin.  PLAN: Findings shared briefly with the patient. Suggest follow-up CT angiogram of the head and neck in six-months.   Neuropsych evaluation 01/03/21  Dr. Milbert Coulter "Briefly, results suggested significant performance variability  across numerous cognitive domains including processing speed, executive functioning, confrontation naming, visuospatial abilities, and learning and memory. No consistent impairments were exhibited. Relative to his previous evaluation in September 2020, there was a large degree of stability when looking by overall domain. While variable, visuospatial abilities did appear to exhibit a mild decline, as did cognitive flexibility. However, outside of this, a consistent decline between the two evaluations cannot be discerned. Relative to most recent neuroimaging, his right-sided intraventricular hemorrhage and bilateral frontal lobe subdural hygromas sustained during a fall this past October could offer an explanation for visuospatial and cognitive flexibility decline, as well as greater subjective dysfunction. Additional explanations for experienced day-to-day cognitive difficulties  continued to include ongoing psychiatric distress (i.e., mild levels of anxiety and depression) and sleep dysfunction, longstanding traits of ADHD (despite not warranting a diagnosis), chronic pain, and neuroimaging suggesting chronic small vessel ischemic changes in the cerebral white matter and history of a remote subdural hematoma".      Past Medical History:  Diagnosis Date   Abdominal aortic atherosclerosis 02/11/2022   Acute encephalopathy 05/08/2019   Allergic rhinitis, seasonal 12/04/2011   Aortic atherosclerosis 02/04/2021   Arthritis of finger of both hands 03/27/2021   Atrial fibrillation (HCC)    BPH (benign prostatic hyperplasia)    Cataract    Chronic anticoagulation 12/21/2017   CHADS VASC=4, Eliquis stopped Sept 2021- recurrent falls with SDH   Chronic pain, legs and back 03/10/2017   Preferred pain management Leg and back pain   Compression fracture of thoracic vertebra 09/21/2017   COPD (chronic obstructive pulmonary disease)    2 liters O2 HS   Degeneration of thoracic intervertebral disc 09/21/2017   Degenerative joint disease of hand 07/14/2017   Dupuytren's contracture 07/12/2019   Essential hypertension 12/04/2011   Fatty liver 12/29/2020   Fatty tumor    waste and back   Fibromyalgia    Generalized anxiety disorder 06/12/2020   12/21 He is reporting severe insomnia and anxiety symptoms.  Options are limited.  We will try a low-dose lorazepam at night and during the day for anxiety and panic attacks.  He will stop lorazepam if problems.  He will stop drinking alcohol.  Discontinue BuSpar.  Reduce Cymbalta to 1 a day.   GERD (gastroesophageal reflux disease)    Glaucoma 03/08/2016   Gout 03/08/2016   Hereditary and idiopathic peripheral neuropathy 08/28/2015   History of COVID-19 05/08/2019   2021 Post-COVID sx's -" brain fog" Try Lion's mane supplement and B complex with niacin for neuropathy   Hyperlipidemia 03/10/2017   Hypothyroidism    Hypoxia     Insomnia    Left-sided sensorineural hearing loss 07/12/2018   Lumbar spondylosis 02/11/2022   Major depressive disorder 09/07/2016   Mild cognitive impairment of uncertain or unknown etiology 12/27/2020   Obesity (BMI 30.0-34.9) 12/08/2016   Obstructive sleep apnea    Osteoarthritis    Osteoporosis    Pain in joint of right ankle 09/15/2021   Persistent atrial fibrillation    on Eliquis   Recurrent falls 03/25/2020   PT. Treat neuropathy, insomnia.  Reduce Cymbalta to 1 a day.  Discontinue BuSpar.   Rib pain on left side 07/25/2021   Rotator cuff arthropathy of left shoulder 07/21/2019   SDH (subdural hematoma) 03/21/2020   Recurrent SDH secondary to falls at home- Sept 2019 and again 03/14/2020- Eliquis stopped.   Sebaceous cyst 06/27/2019   Spinal compression fracture seventh vertebre   Spondylolisthesis 09/07/2016   On fosamax -  for about one year - Dr Ronne Binning  10/14/17 dexa: normal dexa -- spine 2.1,   RFN -0.9,  LFN   -0.7   - no comparison on file, previous fracture      Thoracic back pain 08/10/2017   Thrombocytopenia 12/20/2020   TIA (transient ischemic attack) 08/28/2015   Type 2 diabetes mellitus 03/08/2016     Past Surgical History:  Procedure Laterality Date   APPENDECTOMY  age 54   CARPAL TUNNEL RELEASE Right    early 2000s   CATARACT EXTRACTION     bilateral   CHOLECYSTECTOMY  age 89   EYE SURGERY     FOOT ARTHRODESIS Right 02/02/2013   Procedure: RIGHT HALLUX METATARSAL PHALANGEAL JOINT ARTHRODESIS ;  Surgeon: Toni Arthurs, MD;  Location: [facility];  Service: Orthopedics;  Laterality: Right;   INCISION AND DRAINAGE ABSCESS Left 09/03/2022   Procedure: INCISION AND DRAINAGE LEFT FACIAL  ABSCESS;  Surgeon: Scarlette Ar, MD;  Location: [facility] OR;  Service: ENT;  Laterality: Left;   INCISION AND DRAINAGE ABSCESS Left 09/07/2022   Procedure: IRRIGATION AND DEBRIDMENT OF LEFT FACIAL ABSCESS;  Surgeon: Scarlette Ar, MD;  Location: [facility] OR;  Service: ENT;   Laterality: Left;   INGUINAL HERNIA REPAIR  age 61   rt side   IR ANGIO INTRA EXTRACRAN SEL COM CAROTID INNOMINATE BILAT MOD SED  06/10/2023   IR ANGIO VERTEBRAL SEL SUBCLAVIAN INNOMINATE UNI L MOD SED  06/10/2023   IR ANGIO VERTEBRAL SEL VERTEBRAL UNI R MOD SED  06/10/2023   IR RADIOLOGIST EVAL & MGMT  06/02/2023   NASAL CONCHA BULLOSA RESECTION  age 42   PROSTATE SURGERY     SHOULDER ARTHROSCOPY W/ ROTATOR CUFF REPAIR Right    early 2000s   tonsil     VASECTOMY  age 53     PREVIOUS MEDICATIONS:   CURRENT MEDICATIONS:  Outpatient Encounter Medications as of 08/31/2023  Medication Sig   acetaminophen (TYLENOL) 325 MG tablet Take 325 mg by mouth daily.   allopurinol (ZYLOPRIM) 300 MG tablet TAKE 1 TABLET (300 MG TOTAL) BY MOUTH EVERY EVENING. FOR GOUT   amiodarone (PACERONE) 200 MG tablet TAKE 1 TABLET EVERY DAY   amLODipine (NORVASC) 5 MG tablet TAKE 1 TABLET (5 MG TOTAL) BY MOUTH DAILY.   atorvastatin (LIPITOR) 20 MG tablet TAKE 1 TABLET BY MOUTH EVERY DAY FOR CHOLESTEROL   Calcium Citrate-Vitamin D (CALCIUM + D PO) Take 1 tablet by mouth daily.   carboxymethylcellulose (REFRESH PLUS) 0.5 % SOLN Place 1 drop into both eyes 3 (three) times daily as needed (dry eyes).   Emollient (RA RENEWAL DARK SPOT CORRECTOR EX) Apply 1 application topically daily as needed (dark spots).   FLUoxetine (PROZAC) 40 MG capsule Take 1 capsule (40 mg total) by mouth daily.   Fluticasone Propionate (ALLERGY RELIEF NA) Place 2 sprays into the nose daily as needed (allergies).   hydrochlorothiazide (HYDRODIURIL) 12.5 MG tablet TAKE 1 TABLET BY MOUTH EVERY DAY   Lancets MISC Use to check blood sugars daily. E11.9   latanoprost (XALATAN) 0.005 % ophthalmic solution Place 1 drop into both eyes at bedtime.   Levothyroxine Sodium 175 MCG CAPS Take 175 mcg by mouth daily. Take 30 minutes prior to breakfast.   Lido-Menthol-Methyl Sal-Camph (CBD KINGS EX) Apply 1 Application topically as needed (pain).   Melatonin  5 MG TABS Take 5 mg by mouth at bedtime.   memantine (NAMENDA) 5 MG tablet Take 1 tablet (5 mg at night) for 2  weeks, then increase to 1 tablet (5 mg) twice a day   mirabegron ER (MYRBETRIQ) 25 MG TB24 tablet Take 25 mg by mouth daily.   Multiple Vitamin (MULTIVITAMIN) tablet Take 1 tablet by mouth daily.   omeprazole (PRILOSEC) 20 MG capsule TAKE 1 CAPSULE BY MOUTH EVERY DAY   oxyCODONE-acetaminophen (PERCOCET) 10-325 MG tablet Take 1 tablet by mouth every 8 (eight) hours as needed for pain.   Probiotic Product (PROBIOTIC PO) Take 1 capsule by mouth daily.   Semaglutide (RYBELSUS) 7 MG TABS Take 7 mg by mouth daily. Via [employer] pt assistance   silodosin (RAPAFLO) 8 MG CAPS capsule Take 8 mg by mouth daily.   tamsulosin (FLOMAX) 0.4 MG CAPS capsule Take 0.4 mg by mouth daily.   telmisartan (MICARDIS) 40 MG tablet TAKE 1 TABLET BY MOUTH EVERY DAY   Vibegron (GEMTESA) 75 MG TABS Take 75 mg by mouth daily.   vitamin C (ASCORBIC ACID) 500 MG tablet Take 500 mg by mouth daily.   VITAMIN D, CHOLECALCIFEROL, PO Take 1 capsule by mouth daily.   furosemide (LASIX) 40 MG tablet Take 40 mg by mouth daily. (Patient not taking: Reported on 08/31/2023)   nitroGLYCERIN (NITROSTAT) 0.4 MG SL tablet Place 1 tablet (0.4 mg total) under the tongue every 5 (five) minutes as needed for chest pain. (Patient not taking: Reported on 08/16/2023)   sildenafil (REVATIO) 20 MG tablet Take 40-100 mg by mouth daily as needed. (Patient not taking: Reported on 08/16/2023)   No facility-administered encounter medications on file as of 08/31/2023.     Objective:     PHYSICAL EXAMINATION:    VITALS:   Vitals:   08/31/23 1142  BP: (!) 126/54  Pulse: 66  SpO2: 95%  Weight: 186 lb (84.4 kg)  Height: 5\' 6"  (1.676 m)    GEN:  The patient appears stated age and is in NAD. HEENT:  Normocephalic, atraumatic.   Neurological examination:  General: NAD, well-groomed, appears stated age. Orientation: The patient is alert.  Oriented to person, place and date Cranial nerves: There is good facial symmetry.The speech is fluent and clear. No aphasia or dysarthria. Fund of knowledge is appropriate. Recent memory impaired and remote memory is normal.  Attention and concentration are normal.  Able to name objects and repeat phrases.  Hearing is intact to conversational tone. Delayed recall 3/3 Sensation: Sensation is intact to light touch throughout Motor: Strength is at least antigravity x4. DTR's 2/4 in UE/LE      04/20/2022    2:00 PM 07/30/2021    8:00 AM  Montreal Cognitive Assessment   Visuospatial/ Executive (0/5) 4 4  Naming (0/3) 3 3  Attention: Read list of digits (0/2) 2 2  Attention: Read list of letters (0/1) 1 1  Attention: Serial 7 subtraction starting at 100 (0/3) 3 3  Language: Repeat phrase (0/2) 2 2  Language : Fluency (0/1) 1 1  Abstraction (0/2) 2 1  Delayed Recall (0/5) 4 4  Orientation (0/6) 6 6  Total 28 27  Adjusted Score (based on education) 28 27       04/26/2019    2:53 PM 04/20/2018   11:40 AM 04/19/2017   10:51 AM  MMSE - Mini Mental State Exam  Not completed: Refused  Refused  Orientation to time  5   Orientation to Place  5   Registration  3   Attention/ Calculation  5   Recall  2   Language- name 2 objects  2  Language- repeat  1   Language- follow 3 step command  3   Language- read & follow direction  1   Write a sentence  1   Copy design  1   Total score  29        Movement examination: Tone: There is normal tone in the UE/LE Abnormal movements:  no tremor.  No myoclonus.  No asterixis.   Coordination:  There is no decremation with RAM's. Normal finger to nose  Gait and Station: The patient has no difficulty arising out of a deep-seated chair without the use of the hands. The patient's stride length is good, has a tendency to lean to the right .  Gait is cautious and narrow.   Thank you for allowing [place] the opportunity to participate in the care of this nice  patient. Please do not hesitate to contact us for any questions or concerns.   Total time spent on today's visit was 43 minutes dedicated to this patient today, preparing to see patient, examining the patient, ordering tests and/or medications and counseling the patient, documenting clinical information in the EHR or other health record, independently interpreting results and communicating results to the patient/family, discussing treatment and goals, answering patient's questions and coordinating care.  Cc:  Pincus Sanes, MD  Marlowe Kays 08/31/2023 12:28 PM

## 2023-08-31 NOTE — Patient Instructions (Addendum)
  Follow up in Sept 29 at 11:30. Start Memantine 5mg  tablets.  Take 1 tablet at bedtime for 2 weeks, then 1 tablet twice daily.    Recommend good control of cardiovascular risk factors Continue CPAP for OSA Consider using cane for stability  If symptoms of stroke are present  got to the  ER ! Return in  6 months

## 2023-09-01 ENCOUNTER — Other Ambulatory Visit: Payer: Self-pay

## 2023-09-01 ENCOUNTER — Encounter: Payer: Self-pay | Admitting: Cardiology

## 2023-09-01 MED ORDER — AMIODARONE HCL 200 MG PO TABS: 200.0000 mg | ORAL_TABLET | Freq: Every day | ORAL | 0 refills | Status: DC

## 2023-09-01 NOTE — Telephone Encounter (Signed)
 Spoke to patient advised you have not seen Dr.Crenshaw since 01/27/22.Appointment scheduled with Dr.Crenshaw 3/25 at 3:20 pm.Amiodarone refill sent to [facility] [address]

## 2023-09-05 DIAGNOSIS — M47816 Spondylosis without myelopathy or radiculopathy, lumbar region: Secondary | ICD-10-CM | POA: Diagnosis not present

## 2023-09-05 NOTE — Progress Notes (Unsigned)
 HPI: FU atrial fibrillation. Previously with a fib but converted spontaneously to sinus rhythm. Patient also with significant COPD. Had fall September 2019 resulting in subdural hematoma. Again admitted 9/21 after falling and had subdural. Abdominal ultrasound July 2022 showed no aneurysm. Nuclear study 2/23 showed EF 54 and no ischemia. Monitor 3/23 showed sinus bradycardia, normal sinus rhythm, occasional PAC, brief PAT, rare PVC; question episode of Mobitz 1 second-degree AV block but significant artifact limits interpretation; no prolonged pauses. MRA 4/23 with patent carotid and vertebral arteries.  Patient noted to have 3 syncopal episodes at time of previous office visit.  Patient saw Dr. Elberta Fortis and implantable loop monitor placed.  Echocardiogram May 2024 showed normal LV function, mild right ventricular enlargement, mild mitral regurgitation, trace aortic insufficiency.  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  Since I last saw him, he has dyspnea with more vigorous activities but not routine activities.  No orthopnea, PND, pedal edema, chest pain and has had no recurrent syncope or falls.  Current Outpatient Medications  Medication Sig Dispense Refill   acetaminophen (TYLENOL) 325 MG tablet Take 325 mg by mouth daily.     allopurinol (ZYLOPRIM) 300 MG tablet TAKE 1 TABLET (300 MG TOTAL) BY MOUTH EVERY EVENING. FOR GOUT 90 tablet 3   amiodarone (PACERONE) 200 MG tablet Take 1 tablet (200 mg total) by mouth daily. 30 tablet 0   amLODipine (NORVASC) 5 MG tablet TAKE 1 TABLET (5 MG TOTAL) BY MOUTH DAILY. 90 tablet 3   atorvastatin (LIPITOR) 20 MG tablet TAKE 1 TABLET BY MOUTH EVERY DAY FOR CHOLESTEROL 90 tablet 1   Calcium Citrate-Vitamin D (CALCIUM +  D PO) Take 1 tablet by mouth daily.     carboxymethylcellulose (REFRESH PLUS) 0.5 % SOLN Place 1 drop into both eyes 3 (three) times daily as needed (dry eyes).     Emollient (RA RENEWAL DARK SPOT CORRECTOR EX) Apply 1 application topically daily as needed (dark spots).     FLUoxetine (PROZAC) 40 MG capsule Take 1 capsule (40 mg total) by mouth daily. 90 capsule 1   Fluticasone Propionate (ALLERGY RELIEF NA) Place 2 sprays into the nose daily as needed (allergies).     furosemide (LASIX) 40 MG tablet Take 40 mg by mouth daily.     hydrochlorothiazide (HYDRODIURIL) 12.5 MG tablet TAKE 1 TABLET BY MOUTH EVERY DAY 90 tablet 1   Lancets MISC Use to check blood sugars daily. E11.9 100 each 3   latanoprost (XALATAN) 0.005 % ophthalmic solution Place 1 drop into both eyes at bedtime.     Levothyroxine Sodium 175 MCG CAPS Take 175 mcg by mouth daily. Take 30 minutes prior to breakfast. 90 capsule 1   Lido-Menthol-Methyl Sal-Camph (CBD KINGS EX) Apply 1 Application topically as needed (pain).     Melatonin 5 MG TABS Take 5 mg by mouth at bedtime.     memantine (NAMENDA) 5 MG tablet Take 1 tablet (5 mg at night) for 2 weeks, then increase to 1 tablet (5 mg) twice a day 60 tablet 11   mirabegron ER (MYRBETRIQ) 25 MG TB24 tablet Take 25 mg by mouth daily.     Multiple Vitamin (MULTIVITAMIN) tablet Take 1 tablet by mouth daily.     omeprazole (PRILOSEC) 20 MG capsule TAKE 1 CAPSULE BY  MOUTH EVERY DAY 90 capsule 1   oxyCODONE-acetaminophen (PERCOCET) 10-325 MG tablet Take 1 tablet by mouth every 8 (eight) hours as needed for pain.     Probiotic Product (PROBIOTIC PO) Take 1 capsule by mouth daily.     Semaglutide (RYBELSUS) 7 MG TABS Take 7 mg by mouth daily. Via [employer] pt assistance 30 tablet 3   sildenafil (REVATIO) 20 MG tablet Take 40-100 mg by mouth daily as needed.     silodosin (RAPAFLO) 8 MG CAPS capsule Take 8 mg by mouth daily.     tamsulosin (FLOMAX) 0.4 MG CAPS capsule Take 0.4 mg by mouth  daily.     telmisartan (MICARDIS) 40 MG tablet TAKE 1 TABLET BY MOUTH EVERY DAY 90 tablet 2   Vibegron (GEMTESA) 75 MG TABS Take 75 mg by mouth daily.     vitamin C (ASCORBIC ACID) 500 MG tablet Take 500 mg by mouth daily.     VITAMIN D, CHOLECALCIFEROL, PO Take 1 capsule by mouth daily.     nitroGLYCERIN (NITROSTAT) 0.4 MG SL tablet Place 1 tablet (0.4 mg total) under the tongue every 5 (five) minutes as needed for chest pain. (Patient not taking: Reported on 09/07/2023) 90 tablet 3   No current facility-administered medications for this visit.     Past Medical History:  Diagnosis Date   Abdominal aortic atherosclerosis 02/11/2022   Acute encephalopathy 05/08/2019   Allergic rhinitis, seasonal 12/04/2011   Aortic atherosclerosis 02/04/2021   Arthritis of finger of both hands 03/27/2021   Atrial fibrillation (HCC)    BPH (benign prostatic hyperplasia)    Cataract    Chronic anticoagulation 12/21/2017   CHADS VASC=4, Eliquis stopped Sept 2021- recurrent falls with SDH   Chronic pain, legs and back 03/10/2017   Preferred pain management Leg and back pain   Compression fracture of thoracic vertebra 09/21/2017   COPD (chronic obstructive pulmonary disease)    2 liters O2 HS   Degeneration of thoracic intervertebral disc 09/21/2017   Degenerative joint disease of hand 07/14/2017   Dupuytren's contracture 07/12/2019   Essential hypertension 12/04/2011   Fatty liver 12/29/2020   Fatty tumor    waste and back   Fibromyalgia    Generalized anxiety disorder 06/12/2020   12/21 He is reporting severe insomnia and anxiety symptoms.  Options are limited.  We will try a low-dose lorazepam at night and during the day for anxiety and panic attacks.  He will stop lorazepam if problems.  He will stop drinking alcohol.  Discontinue BuSpar.  Reduce Cymbalta to 1 a day.   GERD (gastroesophageal reflux disease)    Glaucoma 03/08/2016   Gout 03/08/2016   Hereditary and idiopathic peripheral  neuropathy 08/28/2015   History of COVID-19 05/08/2019   2021 Post-COVID sx's -" brain fog" Try Lion's mane supplement and B complex with niacin for neuropathy   Hyperlipidemia 03/10/2017   Hypothyroidism    Hypoxia    Insomnia    Left-sided sensorineural hearing loss 07/12/2018   Lumbar spondylosis 02/11/2022   Major depressive disorder 09/07/2016   Mild cognitive impairment of uncertain or unknown etiology 12/27/2020   Obesity (BMI 30.0-34.9) 12/08/2016   Obstructive sleep apnea    Osteoarthritis    Osteoporosis    Pain in joint of right ankle 09/15/2021   Persistent atrial fibrillation    on Eliquis   Recurrent falls 03/25/2020   PT. Treat neuropathy, insomnia.  Reduce Cymbalta to 1 a day.  Discontinue BuSpar.   Rib pain on  left side 07/25/2021   Rotator cuff arthropathy of left shoulder 07/21/2019   SDH (subdural hematoma) 03/21/2020   Recurrent SDH secondary to falls at home- Sept 2019 and again 03/14/2020- Eliquis stopped.   Sebaceous cyst 06/27/2019   Spinal compression fracture seventh vertebre   Spondylolisthesis 09/07/2016   On fosamax - for about one year - Dr Ronne Binning  10/14/17 dexa: normal dexa -- spine 2.1,   RFN -0.9,  LFN   -0.7   - no comparison on file, previous fracture      Thoracic back pain 08/10/2017   Thrombocytopenia 12/20/2020   TIA (transient ischemic attack) 08/28/2015   Type 2 diabetes mellitus 03/08/2016    Past Surgical History:  Procedure Laterality Date   APPENDECTOMY  age 89   CARPAL TUNNEL RELEASE Right    early 2000s   CATARACT EXTRACTION     bilateral   CHOLECYSTECTOMY  age 88   EYE SURGERY     FOOT ARTHRODESIS Right 02/02/2013   Procedure: RIGHT HALLUX METATARSAL PHALANGEAL JOINT ARTHRODESIS ;  Surgeon: Toni Arthurs, MD;  Location: [facility];  Service: Orthopedics;  Laterality: Right;   INCISION AND DRAINAGE ABSCESS Left 09/03/2022   Procedure: INCISION AND DRAINAGE LEFT FACIAL  ABSCESS;  Surgeon: Scarlette Ar, MD;   Location: [facility] OR;  Service: ENT;  Laterality: Left;   INCISION AND DRAINAGE ABSCESS Left 09/07/2022   Procedure: IRRIGATION AND DEBRIDMENT OF LEFT FACIAL ABSCESS;  Surgeon: Scarlette Ar, MD;  Location: [facility] OR;  Service: ENT;  Laterality: Left;   INGUINAL HERNIA REPAIR  age 72   rt side   IR ANGIO INTRA EXTRACRAN SEL COM CAROTID INNOMINATE BILAT MOD SED  06/10/2023   IR ANGIO VERTEBRAL SEL SUBCLAVIAN INNOMINATE UNI L MOD SED  06/10/2023   IR ANGIO VERTEBRAL SEL VERTEBRAL UNI R MOD SED  06/10/2023   IR RADIOLOGIST EVAL & MGMT  06/02/2023   NASAL CONCHA BULLOSA RESECTION  age 59   PROSTATE SURGERY     SHOULDER ARTHROSCOPY W/ ROTATOR CUFF REPAIR Right    early 2000s   tonsil     VASECTOMY  age 32    Social History   Socioeconomic History   Marital status: Married    Spouse name: Not on file   Number of children: 2   Years of education: 18   Highest education level: Master's degree (e.g., MA, MS, MEng, MEd, MSW, MBA)  Occupational History   Occupation: Retired  Tobacco Use   Smoking status: Former    Current packs/day: 0.00    Average packs/day: 2.0 packs/day for 10.0 years (20.0 ttl pk-yrs)    Types: Cigarettes    Start date: 07/17/1965    Quit date: 07/18/1975    Years since quitting: 48.1    Passive exposure: Past   Smokeless tobacco: Never  Vaping Use   Vaping status: Never Used  Substance and Sexual Activity   Alcohol use: Not Currently    Comment: one - two drinks/week   Drug use: No   Sexual activity: Not Currently    Partners: Female    Comment: married  Other Topics Concern   Not on file  Social History Narrative   Right handed   Master degree   Drinks caffeine retired   retired   Chief Executive Officer Drivers of Corporate investment banker Strain: Low Risk  (06/17/2023)   Overall Financial Resource Strain (CARDIA)    Difficulty of Paying Living Expenses: Not very hard  Food Insecurity: No Food Insecurity (  06/17/2023)   Hunger Vital Sign    Worried About Running Out of Food  in the Last Year: Never true    Ran Out of Food in the Last Year: Never true  Transportation Needs: No Transportation Needs (06/17/2023)   PRAPARE - Administrator, Civil Service (Medical): No    Lack of Transportation (Non-Medical): No  Physical Activity: Insufficiently Active (06/17/2023)   Exercise Vital Sign    Days of Exercise per Week: 1 day    Minutes of Exercise per Session: 10 min  Stress: No Stress Concern Present (06/17/2023)   [employer] of Occupational Health - Occupational Stress Questionnaire    Feeling of Stress : Not at all  Social Connections: Unknown (06/17/2023)   Social Connection and Isolation Panel [NHANES]    Frequency of Communication with Friends and Family: More than three times a week    Frequency of Social Gatherings with Friends and Family: Three times a week    Attends Religious Services: Patient declined    Active Member of Clubs or Organizations: Yes    Attends Banker Meetings: More than 4 times per year    Marital Status: Divorced  Intimate Partner Violence: Not At Risk (09/16/2022)   Humiliation, Afraid, Rape, and Kick questionnaire    Fear of Current or Ex-Partner: No    Emotionally Abused: No    Physically Abused: No    Sexually Abused: No    Family History  Problem Relation Age of Onset   Coronary artery disease Brother    Heart disease Father    Lung cancer Father    Kidney cancer Father    Prostate cancer Father    Arthritis Mother    Lung cancer Mother    Dementia Mother        Unspecified type, not Alzheimer's disease    ROS: Diffuse arthritis but no fevers or chills, productive cough, hemoptysis, dysphasia, odynophagia, melena, hematochezia, dysuria, hematuria, rash, seizure activity, orthopnea, PND, pedal edema, claudication. Remaining systems are negative.  Physical Exam: Well-developed well-nourished in no acute distress.  Skin is warm and dry.  HEENT is normal.  Neck is supple.  Chest is clear to  auscultation with normal expansion.  Cardiovascular exam is regular rate and rhythm.  Abdominal exam nontender or distended. No masses palpated. Extremities show no edema. neuro grossly intact  EKG Interpretation Date/Time:  Tuesday September 07 2023 15:28:15 EDT Ventricular Rate:  57 PR Interval:  188 QRS Duration:  100 QT Interval:  488 QTC Calculation: 474 R Axis:   49  Text Interpretation: Sinus bradycardia with sinus arrhythmia Anteroseptal infarct Confirmed by Olga Millers ([id number]) on 09/07/2023 3:34:52 PM    A/P  1 history of recurrent syncope-implantable loop monitor in place with no recurrences since last office visit.  Will continue to follow.  2 paroxysmal atrial fibrillation-he is in sinus rhythm.  Continue amiodarone.  Apixaban has been held due to recurrent syncope.  I think given his history of syncope and thrombocytopenia the risk outweighs the benefit.  I therefore not resume anticoagulation.  He understands there is a higher risk of CVA off of this medication.  3 history of mitral regurgitation-mild on most recent echocardiogram.  4 hyperlipidemia-continue statin.  5 hypertension-patient's blood pressure is controlled.  Continue present medications.  6 obstructive sleep apnea-continue CPAP.  7 cerebrovascular disease-continue statin.  8 thrombocytopenia-recent platelet count March 2025 62,000.  Follow-up hematology.  Olga Millers, MD

## 2023-09-07 ENCOUNTER — Ambulatory Visit: Attending: Cardiology | Admitting: Cardiology

## 2023-09-07 ENCOUNTER — Encounter: Payer: Self-pay | Admitting: Cardiology

## 2023-09-07 VITALS — BP 129/63 | HR 57 | Ht 66.0 in | Wt 185.0 lb

## 2023-09-07 DIAGNOSIS — E785 Hyperlipidemia, unspecified: Secondary | ICD-10-CM | POA: Diagnosis not present

## 2023-09-07 DIAGNOSIS — I480 Paroxysmal atrial fibrillation: Secondary | ICD-10-CM

## 2023-09-07 DIAGNOSIS — I10 Essential (primary) hypertension: Secondary | ICD-10-CM | POA: Diagnosis not present

## 2023-09-07 DIAGNOSIS — R55 Syncope and collapse: Secondary | ICD-10-CM | POA: Diagnosis not present

## 2023-09-07 NOTE — Patient Instructions (Signed)

## 2023-09-14 DIAGNOSIS — J449 Chronic obstructive pulmonary disease, unspecified: Secondary | ICD-10-CM | POA: Diagnosis not present

## 2023-09-16 DIAGNOSIS — M170 Bilateral primary osteoarthritis of knee: Secondary | ICD-10-CM | POA: Diagnosis not present

## 2023-09-16 NOTE — Progress Notes (Signed)
 [facility] Summary Report / Loop Recorder

## 2023-09-20 ENCOUNTER — Ambulatory Visit (INDEPENDENT_AMBULATORY_CARE_PROVIDER_SITE_OTHER): Payer: PPO

## 2023-09-20 DIAGNOSIS — R55 Syncope and collapse: Secondary | ICD-10-CM | POA: Diagnosis not present

## 2023-09-20 LAB — CUP PACEART REMOTE DEVICE CHECK
Date Time Interrogation Session: 20250406231316
Implantable Pulse Generator Implant Date: 20230612

## 2023-09-21 ENCOUNTER — Ambulatory Visit

## 2023-09-21 ENCOUNTER — Encounter: Payer: Self-pay | Admitting: Physician Assistant

## 2023-09-22 NOTE — Telephone Encounter (Signed)
 Noted. Thanks for informing us. I trust the cardiologist opinion.

## 2023-09-23 ENCOUNTER — Other Ambulatory Visit: Payer: Self-pay | Admitting: Physician Assistant

## 2023-09-23 DIAGNOSIS — M170 Bilateral primary osteoarthritis of knee: Secondary | ICD-10-CM | POA: Diagnosis not present

## 2023-09-24 ENCOUNTER — Encounter: Payer: Self-pay | Admitting: Internal Medicine

## 2023-09-24 DIAGNOSIS — H5201 Hypermetropia, right eye: Secondary | ICD-10-CM | POA: Diagnosis not present

## 2023-09-24 DIAGNOSIS — H04123 Dry eye syndrome of bilateral lacrimal glands: Secondary | ICD-10-CM | POA: Diagnosis not present

## 2023-09-25 ENCOUNTER — Other Ambulatory Visit: Payer: Self-pay | Admitting: Cardiology

## 2023-09-28 ENCOUNTER — Telehealth: Payer: Self-pay

## 2023-09-28 NOTE — Telephone Encounter (Signed)
 Gave pt a call regarding PAP Novo Nordisk (Ozempic) per Novo Nordisk we need to summit a new application for pt left a HIPAA VM to pt,will mail out pt application today and  fax provider portion today.

## 2023-09-30 DIAGNOSIS — M170 Bilateral primary osteoarthritis of knee: Secondary | ICD-10-CM | POA: Diagnosis not present

## 2023-10-04 NOTE — Telephone Encounter (Signed)
 Received APPROVAL letter fron Novo Nordisk for 2025 on Rybelsus  7 mg pt was inform and will be following up with Dr office once received.

## 2023-10-05 DIAGNOSIS — H04123 Dry eye syndrome of bilateral lacrimal glands: Secondary | ICD-10-CM | POA: Diagnosis not present

## 2023-10-11 DIAGNOSIS — M47896 Other spondylosis, lumbar region: Secondary | ICD-10-CM | POA: Diagnosis not present

## 2023-10-11 DIAGNOSIS — M47814 Spondylosis without myelopathy or radiculopathy, thoracic region: Secondary | ICD-10-CM | POA: Diagnosis not present

## 2023-10-12 ENCOUNTER — Telehealth: Payer: Self-pay

## 2023-10-12 DIAGNOSIS — Z79899 Other long term (current) drug therapy: Secondary | ICD-10-CM | POA: Diagnosis not present

## 2023-10-12 DIAGNOSIS — Z5181 Encounter for therapeutic drug level monitoring: Secondary | ICD-10-CM | POA: Diagnosis not present

## 2023-10-12 NOTE — Telephone Encounter (Signed)
 Patient's rybelsus  arrived through patient assistance.  Message left for him today and my-chart sent also regarding pickup.

## 2023-10-14 DIAGNOSIS — J449 Chronic obstructive pulmonary disease, unspecified: Secondary | ICD-10-CM | POA: Diagnosis not present

## 2023-10-20 DIAGNOSIS — R3915 Urgency of urination: Secondary | ICD-10-CM | POA: Diagnosis not present

## 2023-10-20 DIAGNOSIS — N401 Enlarged prostate with lower urinary tract symptoms: Secondary | ICD-10-CM | POA: Diagnosis not present

## 2023-10-25 ENCOUNTER — Ambulatory Visit (INDEPENDENT_AMBULATORY_CARE_PROVIDER_SITE_OTHER): Payer: PPO

## 2023-10-25 DIAGNOSIS — R55 Syncope and collapse: Secondary | ICD-10-CM | POA: Diagnosis not present

## 2023-10-25 LAB — CUP PACEART REMOTE DEVICE CHECK
Date Time Interrogation Session: 20250511233703
Implantable Pulse Generator Implant Date: 20230612

## 2023-10-28 ENCOUNTER — Ambulatory Visit: Payer: Self-pay | Admitting: Cardiology

## 2023-10-30 ENCOUNTER — Encounter: Payer: Self-pay | Admitting: Internal Medicine

## 2023-11-02 DIAGNOSIS — M5416 Radiculopathy, lumbar region: Secondary | ICD-10-CM | POA: Diagnosis not present

## 2023-11-02 DIAGNOSIS — Z79899 Other long term (current) drug therapy: Secondary | ICD-10-CM | POA: Diagnosis not present

## 2023-11-02 NOTE — Progress Notes (Signed)
 [facility] Summary Report / Loop Recorder

## 2023-11-10 MED ORDER — DOCUSATE SODIUM 100 MG PO CAPS: 100.0000 mg | ORAL_CAPSULE | Freq: Every day | ORAL | 5 refills | Status: AC | PRN

## 2023-11-10 NOTE — Addendum Note (Signed)
 Addended by: Colene Dauphin on: 11/10/2023 12:09 PM   Modules accepted: Orders

## 2023-11-14 DIAGNOSIS — J449 Chronic obstructive pulmonary disease, unspecified: Secondary | ICD-10-CM | POA: Diagnosis not present

## 2023-11-18 DIAGNOSIS — Z961 Presence of intraocular lens: Secondary | ICD-10-CM | POA: Diagnosis not present

## 2023-11-22 ENCOUNTER — Other Ambulatory Visit (HOSPITAL_COMMUNITY): Payer: Self-pay | Admitting: Interventional Radiology

## 2023-11-22 ENCOUNTER — Telehealth: Payer: Self-pay | Admitting: Cardiology

## 2023-11-22 ENCOUNTER — Telehealth (HOSPITAL_COMMUNITY): Payer: Self-pay

## 2023-11-22 DIAGNOSIS — I771 Stricture of artery: Secondary | ICD-10-CM

## 2023-11-22 NOTE — Telephone Encounter (Signed)
Called to schedule cta head/neck, no answer, left vm. AB  

## 2023-11-22 NOTE — Telephone Encounter (Signed)
 Pt c/o of Chest Pain: STAT if active (IN THIS MOMENT) CP, including tightness, pressure, jaw pain, shoulder/upper arm/back pain, SOB, nausea, and vomiting.  1. Are you having CP right now (tightness, pressure, or discomfort)? No  2. Are you experiencing any other symptoms (ex. SOB, nausea, vomiting, sweating)? No   3. How long have you been experiencing CP? Friday   4. Is your CP continuous or coming and going? Coming and going   5. Have you taken Nitroglycerin ? Yes   6. If CP returns before callback, please consider calling 911. ?

## 2023-11-22 NOTE — Telephone Encounter (Signed)
 Pt called to report that he hs been having off and on chest pain, sharp in nature on his left side lasting each time for 1-2 sec a few times a few sec longer for the past week.   His last stress test 2023...he has taken nitroglycerin  which has given him some relief after taking just one each time. The pain occurs at rest... he says different from discomfort he has had in the past.. but denies radiation and no SOB.   I urged him to consider going to the  ED but he strongly declined but will go if anything worsens.   With Dr Audery Blazing being out of the office this week... I made him a DOD appt this Thursday 11/25/23.

## 2023-11-24 NOTE — Progress Notes (Signed)
 Cardiology Office Note:   Date:  11/25/2023  ID:  Jonathan [name], DOB [birth year], MRN [id number] PCP: Colene Dauphin, MD  [facility] Providers Cardiologist:  Alexandria Angel, MD Electrophysiologist:  Will Cortland Ding, MD {  History of Present Illness:   Jonathan [name] is a 89 y.o. male who has a history of paroxysmal atrial fibrillation.  He has had a history of fall with subdural hematoma.  He had a negative perfusion study in 2023.  Echocardiogram in 2024 with normal LV function.  He has an implanted loop to evaluate syncope.  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    He was added to the schedule today because of chest discomfort.  He said that this started about 4 5 days ago.  He said he had to take 3 or 4 nitroglycerin  in that amount of time.  He said it happens at rest.  It is mid sternal.  There is no radiation to his jaw or to his arms.  It is 4 out of 10 in intensity.  He has not had any nausea vomiting or diaphoresis.  He has had no new shortness of breath.  The discomfort does only last few seconds.  He is not sure whether he has had this before.  When it comes he comes back but he noticed that when he took the nitroglycerin  it seemed to hold it off for longer.  The last time he had it was yesterday.  He is not particularly able to exert himself because of back and leg problems.  He was able to go grocery shopping yesterday without bringing on this discomfort.  He had no resting symptoms such as PND or orthopnea.  ROS: Positive for constipation.  Otherwise as stated in the HPI and negative for all other systems.  Studies Reviewed:    EKG:   EKG Interpretation Date/Time:  Thursday November 25 2023  13:57:11 EDT Ventricular Rate:  52 PR Interval:  210 QRS Duration:  96 QT Interval:  472 QTC Calculation: 438 R Axis:   68  Text Interpretation: Sinus bradycardia with 1st degree A-V block Low voltage QRS Cannot rule out Anteroseptal infarct (cited on or before 10-Jul-2021) When compared with ECG of 07-Sep-2023 15:28, No significant change was found Confirmed by Eilleen Grates ([id number]) on 11/25/2023 2:12:26 PM    Risk Assessment/Calculations:    CHA2DS2-VASc Score = 6   This indicates a 9.7% annual risk of stroke. The patient's score is based upon: CHF History: 0 HTN History: 1 Diabetes History: 0 Stroke History: 2 Vascular Disease History: 1 Age Score: 2 Gender Score: 0   Physical Exam:   VS:  BP (!) 132/50   Pulse (!) 52   Ht 5' 6 (1.676 m)   Wt 178 lb 6.4 oz (80.9 kg)   SpO2 98%   BMI 28.79 kg/m    Wt Readings from Last 3 Encounters:  11/25/23 178 lb 6.4 oz (80.9 kg)  09/07/23 185 lb (83.9 kg)  08/31/23 186 lb (84.4 kg)     GEN: Well nourished, well developed in no acute distress NECK: No JVD; No carotid bruits CARDIAC: ***RR, *** murmurs, rubs, gallops RESPIRATORY:  Clear to auscultation without rales, wheezing or rhonchi  ABDOMEN: Soft, non-tender, non-distended EXTREMITIES:  No edema;  No deformity   ASSESSMENT AND PLAN:    Chest pain: The patient's chest discomfort has some anginal but predominately nonanginal features.  However, because of his use of nitroglycerin  I do feel compelled to screen for obstructive coronary disease.  I will order a PET scan.    Atrial fibrillation: He is on amiodarone .  Anticoagulation has been held because of recurrent syncope.  He also has thrombocytopenia. ILR interrogation in May demonstrated no arrhythmias.   Mitral regurgitation: This has been mild.  Follow-up per Dr. Audery Blazing  Cerebrovascular disease: He is on statin.  Follow up with Dr. Audery Blazing after the perfusion study.  Signed, Eilleen Grates, MD

## 2023-11-25 ENCOUNTER — Ambulatory Visit: Attending: Cardiology | Admitting: Cardiology

## 2023-11-25 ENCOUNTER — Ambulatory Visit

## 2023-11-25 ENCOUNTER — Other Ambulatory Visit (HOSPITAL_COMMUNITY): Payer: Self-pay

## 2023-11-25 ENCOUNTER — Encounter: Payer: Self-pay | Admitting: Cardiology

## 2023-11-25 ENCOUNTER — Other Ambulatory Visit: Payer: Self-pay | Admitting: Internal Medicine

## 2023-11-25 VITALS — BP 132/50 | HR 52 | Ht 66.0 in | Wt 178.4 lb

## 2023-11-25 DIAGNOSIS — I480 Paroxysmal atrial fibrillation: Secondary | ICD-10-CM | POA: Diagnosis not present

## 2023-11-25 DIAGNOSIS — R072 Precordial pain: Secondary | ICD-10-CM | POA: Diagnosis not present

## 2023-11-25 MED ORDER — NITROGLYCERIN 0.4 MG SL SUBL
0.4000 mg | SUBLINGUAL_TABLET | SUBLINGUAL | 10 refills | Status: AC | PRN
  Filled 2023-11-25: qty 25, 8d supply, fill #0

## 2023-11-25 NOTE — Patient Instructions (Signed)
 Medication Instructions:  Your physician recommends that you continue on your current medications as directed. Please refer to the Current Medication list given to you today.  *If you need a refill on your cardiac medications before your next appointment, please call your pharmacy*  Lab Work: NONE If you have labs (blood work) drawn today and your tests are completely normal, you will receive your results only by: MyChart Message (if you have MyChart) OR A paper copy in the mail If you have any lab test that is abnormal or we need to change your treatment, we will call you to review the results.  Testing/Procedures: PET/CT Stress Test  Follow-Up: At [facility], you and your health needs are our priority.  As part of our continuing mission to provide you with exceptional heart care, our providers are all part of one team.  This team includes your primary Cardiologist (physician) and Advanced Practice Providers or APPs (Physician Assistants and Nurse Practitioners) who all work together to provide you with the care you need, when you need it.  Your next appointment:   After PET/CT Stress Test  Provider:   Audery Blazing, MD  We recommend signing up for the patient portal called MyChart.  Sign up information is provided on this After Visit Summary.  MyChart is used to connect with patients for Virtual Visits (Telemedicine).  Patients are able to view lab/test results, encounter notes, upcoming appointments, etc.  Non-urgent messages can be sent to your provider as well.   To learn more about what you can do with MyChart, go to [email or web address].   Other Instructions    Please report to Radiology at the [facility] Main Entrance 30 minutes early for your test.  [address] [place], [place] [address]   How to Prepare for Your Cardiac PET/CT Stress Test:  Nothing to eat or drink, except water, 3 hours prior to arrival time.  NO caffeine/decaffeinated  products, or chocolate 12 hours prior to arrival. (Please note decaffeinated beverages (teas/coffees) still contain caffeine).  If you have caffeine within 12 hours prior, the test will need to be rescheduled.  Medication instructions: Do not take erectile dysfunction medications for 72 hours prior to test (sildenafil, tadalafil ) Do not take nitrates (isosorbide mononitrate, Ranexa) the day before or day of test Do not take tamsulosin the day before or morning of test Hold theophylline containing medications for 12 hours. Hold Dipyridamole 48 hours prior to the test.  Diabetic Preparation: If able to eat breakfast prior to 3 hour fasting, you may take all medications, including your insulin . Do not worry if you miss your breakfast dose of insulin  - start at your next meal. If you do not eat prior to 3 hour fast-Hold all diabetes (oral and insulin ) medications. Patients who wear a continuous glucose monitor MUST remove the device prior to scanning.  You may take your remaining medications with water.  NO perfume, cologne or lotion on chest or abdomen area. FEMALES - Please avoid wearing dresses to this appointment.  Total time is 1 to 2 hours; you may want to bring reading material for the waiting time.  IF YOU THINK YOU MAY BE PREGNANT, OR ARE NURSING PLEASE INFORM THE TECHNOLOGIST.  In preparation for your appointment, medication and supplies will be purchased.  Appointment availability is limited, so if you need to cancel or reschedule, please call the Radiology Department Scheduler at [phone] 24 hours in advance to avoid a cancellation fee of $100.00  What to  Expect When you Arrive:  Once you arrive and check in for your appointment, you will be taken to a preparation room within the Radiology Department.  A technologist or Nurse will obtain your medical history, verify that you are correctly prepped for the exam, and explain the procedure.  Afterwards, an IV will be started in  your arm and electrodes will be placed on your skin for EKG monitoring during the stress portion of the exam. Then you will be escorted to the PET/CT scanner.  There, staff will get you positioned on the scanner and obtain a blood pressure and EKG.  During the exam, you will continue to be connected to the EKG and blood pressure machines.  A small, safe amount of a radioactive tracer will be injected in your IV to obtain a series of pictures of your heart along with an injection of a stress agent.    After your Exam:  It is recommended that you eat a meal and drink a caffeinated beverage to counter act any effects of the stress agent.  Drink plenty of fluids for the remainder of the day and urinate frequently for the first couple of hours after the exam.  Your doctor will inform you of your test results within 7-10 business days.  For more information and frequently asked questions, please visit our website: [email or web address]  For questions about your test or how to prepare for your test, please call: Cardiac Imaging Nurse Navigators Office: [phone]

## 2023-11-29 ENCOUNTER — Ambulatory Visit: Payer: PPO

## 2023-11-29 ENCOUNTER — Other Ambulatory Visit (HOSPITAL_COMMUNITY): Payer: Self-pay

## 2023-12-08 ENCOUNTER — Ambulatory Visit

## 2023-12-08 VITALS — Ht 66.0 in | Wt 178.0 lb

## 2023-12-08 DIAGNOSIS — Z0000 Encounter for general adult medical examination without abnormal findings: Secondary | ICD-10-CM

## 2023-12-08 NOTE — Patient Instructions (Signed)
 Mr. Legan , Thank you for taking time out of your busy schedule to complete your Annual Wellness Visit with me. I enjoyed our conversation and look forward to speaking with you again next year. I, as well as your care team,  appreciate your ongoing commitment to your health goals. Please review the following plan we discussed and let me know if I can assist you in the future. Your Game plan/ To Do List     Follow up Visits: Next Medicare AWV with our clinical staff: 12/08/2024.   Have you seen your provider in the last 6 months (3 months if uncontrolled diabetes)? Yes Next Office Visit with your provider: Patient stated that he will call the office to schedule a follow up visit.  His last office visit was on 07/11/2023.  Clinician Recommendations:  Aim for 30 minutes of exercise or brisk walking, 6-8 glasses of water, and 5 servings of fruits and vegetables each day.  Take care of you.      This is a list of the screening recommended for you and due dates:  Health Maintenance  Topic Date Due   COVID-19 Vaccine (9 - 2024-25 season) 02/14/2023   Eye exam for diabetics  04/29/2023   Medicare Annual Wellness Visit  09/16/2023   Hemoglobin A1C  12/19/2023   Flu Shot  01/14/2024   Complete foot exam   06/20/2024   DTaP/Tdap/Td vaccine (3 - Td or Tdap) 03/06/2028   Pneumococcal Vaccine for age over 70  Completed   Hepatitis B Vaccine  Completed   Zoster (Shingles) Vaccine  Completed   HPV Vaccine  Aged Out   Meningitis B Vaccine  Aged Out    Advanced directives: (In Chart) A copy of your advanced directives are scanned into your chart should your provider ever need it. Advance Care Planning is important because it:  [x]  Makes sure you receive the medical care that is consistent with your values, goals, and preferences  [x]  It provides guidance to your family and loved ones and reduces their decisional burden about whether or not they are making the right decisions based on your  wishes.  Follow the link provided in your after visit summary or read over the paperwork we have mailed to you to help you started getting your Advance Directives in place. If you need assistance in completing these, please reach out to us  so that we can help you!  See attachments for Preventive Care and Fall Prevention Tips.

## 2023-12-08 NOTE — Progress Notes (Deleted)
 Subjective:   Jonathan [name] is a 89 y.o. who presents for a Medicare Wellness preventive visit.  As a reminder, Annual Wellness Visits don't include a physical exam, and some assessments may be limited, especially if this visit is performed virtually. We may recommend an in-person follow-up visit with your provider if needed.  Visit Complete: Virtual I connected with  [name] on 12/08/23 by a audio enabled telemedicine application and verified that I am speaking with the correct person using two identifiers.  Patient Location: Home  Provider Location: Home Office  I discussed the limitations of evaluation and management by telemedicine. The patient expressed understanding and agreed to proceed.  Vital Signs: Because this visit was a virtual/telehealth visit, some criteria may be missing or patient reported. Any vitals not documented were not able to be obtained and vitals that have been documented are patient reported.  VideoDeclined- This patient declined Librarian, academic. Therefore the visit was completed with audio only.  Persons Participating in Visit: Patient.  AWV Questionnaire: No: Patient Medicare AWV questionnaire was not completed prior to this visit.  Cardiac Risk Factors include: advanced age (>77men, >64 women);hypertension;diabetes mellitus;Other (see comment);dyslipidemia, Risk factor comments: A-fib, TIA, COPD, OSA     Objective:    Today's Vitals   12/05/23 1853 12/08/23 1524  Weight:  178 lb (80.7 kg)  Height:  5' 6 (1.676 m)  PainSc: 8     Body mass index is 28.73 kg/m.     12/08/2023    3:27 PM 08/31/2023   11:45 AM 06/10/2023   10:23 AM 05/05/2023   10:59 AM 02/16/2023   11:24 AM 01/11/2023    9:57 AM 10/16/2022   12:17 PM  Advanced Directives  Does Patient Have a Medical Advance Directive? Yes No Yes Yes Yes Yes Yes  Type of Estate agent of [place];Living will  Healthcare Power of  [place];Living will Healthcare Power of [employer] of [place];Living will Healthcare Power of [employer] of [place];Living will  Does patient want to make changes to medical advance directive? No - Patient declined   No - Patient declined No - Patient declined No - Patient declined No - Patient declined  Copy of Healthcare Power of Attorney in Chart? Yes - validated most recent copy scanned in chart (See row information)   No - copy requested No - copy requested No - copy requested No - copy requested    Current Medications (verified) Outpatient Encounter Medications as of 12/08/2023  Medication Sig   acetaminophen  (TYLENOL ) 325 MG tablet Take 325 mg by mouth daily.   allopurinol  (ZYLOPRIM ) 300 MG tablet TAKE 1 TABLET BY MOUTH IN THE EVENING FOR GOUT   amiodarone  (PACERONE ) 200 MG tablet TAKE 1 TABLET BY MOUTH EVERY DAY   amLODipine  (NORVASC ) 5 MG tablet TAKE 1 TABLET (5 MG TOTAL) BY MOUTH DAILY.   atorvastatin  (LIPITOR) 20 MG tablet TAKE 1 TABLET BY MOUTH EVERY DAY FOR CHOLESTEROL   Calcium  Citrate-Vitamin D  (CALCIUM  + D PO) Take 1 tablet by mouth daily.   carboxymethylcellulose (REFRESH PLUS) 0.5 % SOLN Place 1 drop into both eyes 3 (three) times daily as needed (dry eyes).   docusate sodium  (COLACE) 100 MG capsule Take 1-3 capsules (100-300 mg total) by mouth daily as needed for mild constipation.   Emollient (RA RENEWAL DARK SPOT CORRECTOR EX) Apply 1 application topically daily as needed (dark spots).   FLUoxetine  (PROZAC ) 40 MG capsule Take 1 capsule (40 mg total)  by mouth daily.   Fluticasone  Propionate (ALLERGY RELIEF NA) Place 2 sprays into the nose daily as needed (allergies).   furosemide  (LASIX ) 40 MG tablet Take 40 mg by mouth daily.   hydrochlorothiazide  (HYDRODIURIL ) 12.5 MG tablet TAKE 1 TABLET BY MOUTH EVERY DAY   Lancets MISC Use to check blood sugars daily. E11.9   latanoprost  (XALATAN ) 0.005 % ophthalmic solution Place 1 drop into both eyes at  bedtime.   Levothyroxine  Sodium 175 MCG CAPS Take 175 mcg by mouth daily. Take 30 minutes prior to breakfast.   Lido-Menthol-Methyl Sal-Camph (CBD KINGS EX) Apply 1 Application topically as needed (pain).   Melatonin 5 MG TABS Take 5 mg by mouth at bedtime.   memantine  (NAMENDA ) 5 MG tablet TAKE 1 TABLET (5 MG AT NIGHT) BY MOUTH FOR 2 WEEKS, THEN INCREASE TO 1 TABLET (5 MG) TWICE A DAY   mirabegron  ER (MYRBETRIQ ) 25 MG TB24 tablet Take 25 mg by mouth daily.   Multiple Vitamin (MULTIVITAMIN) tablet Take 1 tablet by mouth daily.   nitroGLYCERIN  (NITROSTAT ) 0.4 MG SL tablet Place 1 tablet (0.4 mg total) under the tongue every 5 (five) minutes as needed for chest pain.   omeprazole  (PRILOSEC) 20 MG capsule TAKE 1 CAPSULE BY MOUTH EVERY DAY   oxyCODONE -acetaminophen  (PERCOCET) 10-325 MG tablet Take 1 tablet by mouth every 8 (eight) hours as needed for pain.   Probiotic Product (PROBIOTIC PO) Take 1 capsule by mouth daily.   Semaglutide  (RYBELSUS ) 7 MG TABS Take 7 mg by mouth daily. Via [employer] pt assistance   sildenafil (REVATIO) 20 MG tablet Take 40-100 mg by mouth daily as needed.   silodosin (RAPAFLO) 8 MG CAPS capsule Take 8 mg by mouth daily.   tamsulosin (FLOMAX) 0.4 MG CAPS capsule Take 0.4 mg by mouth daily.   telmisartan  (MICARDIS ) 40 MG tablet TAKE 1 TABLET BY MOUTH EVERY DAY   Varenicline Tartrate (TYRVAYA) 0.03 MG/ACT SOLN Place into the nose.   Vibegron (GEMTESA) 75 MG TABS Take 75 mg by mouth daily.   vitamin C  (ASCORBIC ACID ) 500 MG tablet Take 500 mg by mouth daily.   VITAMIN D , CHOLECALCIFEROL , PO Take 1 capsule by mouth daily.   No facility-administered encounter medications on file as of 12/08/2023.    Allergies (verified) Dust mite extract and Other   History: Past Medical History:  Diagnosis Date   Abdominal aortic atherosclerosis 02/11/2022   Acute encephalopathy 05/08/2019   Allergic rhinitis, seasonal 12/04/2011   Aortic atherosclerosis 02/04/2021   Arthritis of  finger of both hands 03/27/2021   Atrial fibrillation (HCC)    BPH (benign prostatic hyperplasia)    Cataract    Chronic anticoagulation 12/21/2017   CHADS VASC=4, Eliquis  stopped Sept 2021- recurrent falls with SDH   Chronic pain, legs and back 03/10/2017   Preferred pain management Leg and back pain   Compression fracture of thoracic vertebra 09/21/2017   COPD (chronic obstructive pulmonary disease)    2 liters O2 HS   Degeneration of thoracic intervertebral disc 09/21/2017   Degenerative joint disease of hand 07/14/2017   Dupuytren's contracture 07/12/2019   Essential hypertension 12/04/2011   Fatty liver 12/29/2020   Fatty tumor    waste and back   Fibromyalgia    Generalized anxiety disorder 06/12/2020   12/21 He is reporting severe insomnia and anxiety symptoms.  Options are limited.  We will try a low-dose lorazepam  at night and during the day for anxiety and panic attacks.  He will stop lorazepam   if problems.  He will stop drinking alcohol .  Discontinue BuSpar .  Reduce Cymbalta  to 1 a day.   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 Treat neuropathy, insomnia.  Reduce Cymbalta  to 1 a day.  Discontinue BuSpar .   Rib pain on left side 07/25/2021   Rotator  cuff arthropathy of left shoulder 07/21/2019   SDH (subdural hematoma) 03/21/2020   Recurrent SDH secondary to falls at home- Sept 2019 and again 03/14/2020- Eliquis  stopped.   Sebaceous cyst 06/27/2019   Spinal compression fracture seventh vertebre   Spondylolisthesis 09/07/2016   On fosamax  - for about one year - Dr Sherrilee  10/14/17 dexa: normal dexa -- spine 2.1,   RFN -0.9,  LFN   -0.7   - no comparison on file, previous fracture      Thoracic back pain 08/10/2017   Thrombocytopenia 12/20/2020   TIA (transient ischemic attack) 08/28/2015   Type 2 diabetes mellitus 03/08/2016   Past Surgical History:  Procedure Laterality Date   APPENDECTOMY  age 63   CARPAL TUNNEL RELEASE Right    early 2000s   CATARACT EXTRACTION     bilateral   CHOLECYSTECTOMY  age 72   EYE SURGERY     FOOT ARTHRODESIS Right 02/02/2013   Procedure: RIGHT HALLUX METATARSAL PHALANGEAL JOINT ARTHRODESIS ;  Surgeon: Norleen Armor, MD;  Location: [facility];  Service: Orthopedics;  Laterality: Right;   INCISION AND DRAINAGE ABSCESS Left 09/03/2022   Procedure: INCISION AND DRAINAGE LEFT FACIAL  ABSCESS;  Surgeon: Luciano Standing, MD;  Location: [facility] OR;  Service: ENT;  Laterality: Left;   INCISION AND DRAINAGE ABSCESS Left 09/07/2022   Procedure: IRRIGATION AND DEBRIDMENT OF LEFT FACIAL ABSCESS;  Surgeon: Luciano Standing, MD;  Location: [facility] OR;  Service: ENT;  Laterality: Left;   INGUINAL HERNIA REPAIR  age 10   rt side   IR ANGIO INTRA EXTRACRAN SEL COM CAROTID INNOMINATE BILAT MOD SED  06/10/2023   IR ANGIO VERTEBRAL SEL SUBCLAVIAN INNOMINATE UNI L MOD SED  06/10/2023   IR ANGIO VERTEBRAL SEL VERTEBRAL UNI R MOD SED  06/10/2023   IR RADIOLOGIST EVAL & MGMT  06/02/2023   NASAL CONCHA BULLOSA RESECTION  age 26   PROSTATE SURGERY     SHOULDER ARTHROSCOPY W/ ROTATOR CUFF REPAIR Right    early 2000s   tonsil     VASECTOMY  age 61   Family History  Problem Relation Age of Onset   Coronary artery disease  Brother    Heart disease Father    Lung cancer Father    Kidney cancer Father    Prostate cancer Father    Arthritis Mother    Lung cancer Mother    Dementia Mother  Unspecified type, not Alzheimer's disease   Social History   Socioeconomic History   Marital status: Married    Spouse name: Heron   Number of children: 2   Years of education: 18   Highest education level: Master's degree (e.g., MA, MS, MEng, MEd, MSW, MBA)  Occupational History   Occupation: Retired  Tobacco Use   Smoking status: Former    Current packs/day: 0.00    Average packs/day: 2.0 packs/day for 10.0 years (20.0 ttl pk-yrs)    Types: Cigarettes    Start date: 07/17/1965    Quit date: 07/18/1975    Years since quitting: 48.4    Passive exposure: Past   Smokeless tobacco: Never  Vaping Use   Vaping status: Never Used  Substance and Sexual Activity   Alcohol  use: Not Currently    Comment: one - two drinks/week   Drug use: No   Sexual activity: Not Currently    Partners: Female    Comment: married  Other Topics Concern   Not on file  Social History Narrative   Right handed   Master degree   Drinks caffeine retired   Retired      Lives with wife and 2 cats/2025   Social Drivers of Health   Financial Resource Strain: Patient Declined (12/05/2023)   Overall Financial Resource Strain (CARDIA)    Difficulty of Paying Living Expenses: Patient declined  Food Insecurity: No Food Insecurity (12/05/2023)   Hunger Vital Sign    Worried About Running Out of Food in the Last Year: Never true    Ran Out of Food in the Last Year: Never true  Transportation Needs: No Transportation Needs (12/08/2023)   PRAPARE - Administrator, Civil Service (Medical): No    Lack of Transportation (Non-Medical): No  Physical Activity: Insufficiently Active (12/05/2023)   Exercise Vital Sign    Days of Exercise per Week: 1 day    Minutes of Exercise per Session: 10 min  Stress: No Stress Concern Present  (12/05/2023)   [employer] of Occupational Health - Occupational Stress Questionnaire    Feeling of Stress: Only a little  Social Connections: Unknown (06/17/2023)   Social Connection and Isolation Panel    Frequency of Communication with Friends and Family: More than three times a week    Frequency of Social Gatherings with Friends and Family: Three times a week    Attends Religious Services: Patient declined    Active Member of Clubs or Organizations: Yes    Attends Engineer, structural: More than 4 times per year    Marital Status: Divorced    Tobacco Counseling Counseling given: Not Answered    Clinical Intake:  Pre-visit preparation completed: Yes  Pain : 0-10 Pain Score: 8  Pain Type: Chronic pain Pain Location:  (both legs and back) Pain Descriptors / Indicators: Aching, Discomfort Pain Onset: More than a month ago Pain Frequency: Constant Pain Relieving Factors: oxycodone   Pain Relieving Factors: oxycodone   BMI - recorded: 28.73 Nutritional Status: BMI 25 -29 Overweight Nutritional Risks: Nausea/ vomitting/ diarrhea (last Sunday had some vomiting) Diabetes: Yes CBG done?: No Did pt. bring in CBG monitor from home?: No  Lab Results  Component Value Date   HGBA1C 6.6 (H) 06/21/2023   HGBA1C 6.2 11/19/2022   HGBA1C 7.1 (H) 05/20/2022     How often do you need to have someone help you when you read instructions, pamphlets, or other written materials from your doctor or pharmacy?: 1 - Never  Interpreter Needed?: No  Information entered by :: Anetra Czerwinski, RMA   Activities of Daily Living     12/05/2023    6:53 PM  In your present state of health, do you have any difficulty performing the following activities:  Hearing? 1  Comment Wears hearing aides  Vision? 0  Difficulty concentrating or making decisions? 1  Walking or climbing stairs? 0  Dressing or bathing? 0  Doing errands, shopping? 0  Preparing Food and eating ? N  Using the  Toilet? N  In the past six months, have you accidently leaked urine? N  Do you have problems with loss of bowel control? N  Managing your Medications? N  Managing your Finances? N  Housekeeping or managing your Housekeeping? N    Patient Care Team: Geofm Glade PARAS, MD as PCP - General (Internal Medicine) Pietro Redell RAMAN, MD as PCP - Cardiology (Cardiology) Inocencio Soyla Lunger, MD as PCP - Electrophysiology (Cardiology) Ernesto Alm BRAVO, MD as Referring Physician (Family Medicine) Louis Shove, MD as Consulting Physician (Neurosurgery) Pietro Redell RAMAN, MD as Consulting Physician (Cardiology) Sheldon Standing, MD as Consulting Physician (General Surgery) Baldwin Ubaldo Motto, NP as Nurse Practitioner (Nurse Practitioner) Fate Morna SAILOR, [facility] (Inactive) as Pharmacist (Pharmacist) I have updated your Care Teams any recent Medical Services you may have received from other providers in the past year.     Assessment:   This is a routine wellness examination for [place].  Hearing/Vision screen Hearing Screening - Comments:: Wears hearing aides Vision Screening - Comments:: Wears eyeglasses/DR. Bowen   Goals Addressed             This Visit's Progress    My goal for 2024 is to continue with physical therapy to improve mobility.   Not on track    Would like to keep same goal/2025       Depression Screen     12/08/2023    3:39 PM 06/21/2023   10:39 AM 12/04/2022    1:47 PM 11/23/2022    8:14 AM 09/17/2022   11:10 AM 09/16/2022   10:12 AM 09/02/2022    9:26 AM  PHQ 2/9 Scores  PHQ - 2 Score 3 3 4 4  0 1 1  PHQ- 9 Score 9 8 10 12 3 3 3     Fall Risk     12/05/2023    6:53 PM 08/31/2023   11:45 AM 06/21/2023   10:25 AM 05/05/2023   10:59 AM 01/11/2023    9:56 AM  Fall Risk   Falls in the past year? 1 0 0 0 1  Number falls in past yr:  0 0 0 1  Injury with Fall?  0 0 0 0  Risk for fall due to :   No Fall Risks    Follow up Falls evaluation completed;Falls prevention discussed  Falls evaluation completed Falls evaluation completed Falls evaluation completed Falls evaluation completed    MEDICARE RISK AT HOME:  Medicare Risk at Home Any stairs in or around the home?: (Patient-Rptd) No Home free of loose throw rugs in walkways, pet beds, electrical cords, etc?: (Patient-Rptd) Yes Adequate lighting in your home to reduce risk of falls?: (Patient-Rptd) Yes Life alert?: (Patient-Rptd) No Use of a cane, walker or w/c?: (Patient-Rptd) No Grab bars in the bathroom?: (Patient-Rptd) Yes Shower chair or bench in shower?: (Patient-Rptd) No Elevated toilet seat or a handicapped toilet?: (Patient-Rptd) Yes  TIMED UP AND GO:  Was the test performed?  No  Cognitive Function: 6CIT  completed    04/26/2019    2:53 PM 04/20/2018   11:40 AM 04/19/2017   10:51 AM 08/28/2015    3:20 PM  MMSE - Mini Mental State Exam  Not completed: Refused  Refused   Orientation to time  5  5   Orientation to Place  5  5   Registration  3  3   Attention/ Calculation  5  5   Recall  2  2   Language- name 2 objects  2  2   Language- repeat  1  1  Language- follow 3 step command  3  3   Language- read & follow direction  1  1   Write a sentence  1  1   Copy design  1  1   Total score  29  29      Data saved with a previous flowsheet row definition      04/20/2022    2:00 PM 07/30/2021    8:00 AM  Montreal Cognitive Assessment   Visuospatial/ Executive (0/5) 4 4  Naming (0/3) 3 3  Attention: Read list of digits (0/2) 2 2  Attention: Read list of letters (0/1) 1 1  Attention: Serial 7 subtraction starting at 100 (0/3) 3 3  Language: Repeat phrase (0/2) 2 2  Language : Fluency (0/1) 1 1  Abstraction (0/2) 2 1  Delayed Recall (0/5) 4 4  Orientation (0/6) 6 6  Total 28 27  Adjusted Score (based on education) 28 27      12/08/2023    3:38 PM 09/16/2022   10:16 AM 04/26/2020    9:44 AM  6CIT Screen  What Year? 0 points 0 points 0 points  What month? 0 points 0 points 0 points   What time? 0 points 0 points 0 points  Count back from 20 0 points 0 points 0 points  Months in reverse 0 points 0 points 0 points  Repeat phrase 0 points 0 points 0 points  Total Score 0 points 0 points 0 points    Immunizations Immunization History  Administered Date(s) Administered   Fluad Quad(high Dose 65+) 03/25/2020, 04/07/2021, 03/28/2022, 03/10/2023   Hepatitis A 07/25/2007, 01/22/2008   Hepatitis B 04/30/1988, 06/02/1988, 10/28/1988   IPV 05/15/1996   Influenza Split 02/19/2012, 03/13/2016   Influenza Whole 02/17/2011, 03/13/2013   Influenza, High Dose Seasonal PF 03/10/2017, 02/15/2018, 02/09/2019   Influenza,inj,Quad PF,6+ Mos 03/15/2015   Influenza,inj,quad, With Preservative 02/14/2019   Meningococcal polysaccharide vaccine (MPSV4) 05/15/1996   Moderna Covid-19 Fall Seasonal Vaccine 67yrs & older 12/16/2022   Moderna Covid-19 Vaccine  Bivalent Booster 81yrs & up 02/25/2021, 10/06/2021   Moderna Sars-Covid-2 Vaccination 07/15/2019, 08/12/2019, 02/15/2020, 07/25/2020   PNEUMOCOCCAL CONJUGATE-20 07/09/2021   Pfizer(Comirnaty)Fall Seasonal Vaccine 12 years and older 03/09/2022   Pneumococcal Conjugate-13 12/08/2016   Pneumococcal-Unspecified 02/01/2006   Respiratory Syncytial Virus Vaccine,Recomb Aduvanted(Arexvy) 04/25/2022   Td 01/14/1995   Tdap 03/06/2018   Zoster Recombinant(Shingrix) 01/15/2017, 01/04/2018   Zoster, Live 02/01/2006    Screening Tests Health Maintenance  Topic Date Due   COVID-19 Vaccine (9 - 2024-25 season) 02/14/2023   OPHTHALMOLOGY EXAM  04/29/2023   Medicare Annual Wellness (AWV)  09/16/2023   HEMOGLOBIN A1C  12/19/2023   INFLUENZA VACCINE  01/14/2024   FOOT EXAM  06/20/2024   DTaP/Tdap/Td (3 - Td or Tdap) 03/06/2028   Pneumococcal Vaccine: 50+ Years  Completed   Hepatitis B Vaccines  Completed   Zoster Vaccines- Shingrix  Completed   HPV VACCINES  Aged Out   Meningococcal B Vaccine  Aged Out    Health Maintenance  Health  Maintenance Due  Topic Date Due   COVID-19 Vaccine (9 - 2024-25 season) 02/14/2023   OPHTHALMOLOGY EXAM  04/29/2023   Medicare Annual Wellness (AWV)  09/16/2023   Health Maintenance Items Addressed: See Nurse Notes at the end of this note  Additional Screening:  Vision Screening: Recommended annual ophthalmology exams for early detection of glaucoma and other disorders of the eye. Would you like a referral to an eye doctor? No    Dental Screening: Recommended annual dental exams for proper oral hygiene  Community Resource Referral / Chronic Care Management: CRR required this visit?  No   CCM required this visit?  No   Plan:    I have personally reviewed and noted the following in the patient's chart:   Medical and social history Use of alcohol , tobacco or illicit drugs  Current medications and supplements including opioid prescriptions. Patient is currently taking opioid prescriptions. Information provided to patient regarding non-opioid alternatives. Patient advised to discuss non-opioid treatment plan with their provider. Functional ability and status Nutritional status Physical activity Advanced directives List of other physicians Hospitalizations, surgeries, and ER visits in previous 12 months Vitals Screenings to include cognitive, depression, and falls Referrals and appointments  In addition, I have reviewed and discussed with patient certain preventive protocols, quality metrics, and best practice recommendations. A written personalized care plan for preventive services as well as general preventive health recommendations were provided to patient.   Ennis Heavner L Eino Whitner, CMA   12/08/2023   After Visit Summary: (MyChart) Due to this being a telephonic visit, the after visit summary with patients personalized plan was offered to patient via MyChart   Notes: Patient stated that he had a recent eye exam.  I have sent a request for his last eye exam to Dr. Johnella office.   Patient is up to date on all other health maintenance with no concerns to address today.

## 2023-12-09 NOTE — Progress Notes (Signed)
 [facility] Summary Report / Loop Recorder

## 2023-12-13 NOTE — Progress Notes (Signed)
 Subjective:   Jonathan [name] is a 89 y.o. who presents for a Medicare Wellness preventive visit.  As a reminder, Annual Wellness Visits don't include a physical exam, and some assessments may be limited, especially if this visit is performed virtually. We may recommend an in-person follow-up visit with your provider if needed.  Visit Complete: Virtual I connected with  [name] on 12/08/2023 by a audio enabled telemedicine application and verified that I am speaking with the correct person using two identifiers.  Patient Location: Home  Provider Location: Home Office  I discussed the limitations of evaluation and management by telemedicine. The patient expressed understanding and agreed to proceed.  Vital Signs: Because this visit was a virtual/telehealth visit, some criteria may be missing or patient reported. Any vitals not documented were not able to be obtained and vitals that have been documented are patient reported.  VideoDeclined- This patient declined Librarian, academic. Therefore the visit was completed with audio only.  Persons Participating in Visit: Patient.  AWV Questionnaire: Yes: Patient Medicare AWV questionnaire was completed by the patient on 12/05/2023; I have confirmed that all information answered by patient is correct and no changes since this date.  Cardiac Risk Factors include: advanced age (>71men, >38 women);hypertension;diabetes mellitus;Other (see comment);dyslipidemia, Risk factor comments: A-fib, TIA, COPD, OSA     Objective:    Today's Vitals   12/05/23 1853 12/08/23 1524  Weight:  178 lb (80.7 kg)  Height:  5' 6 (1.676 m)  PainSc: 8     Body mass index is 28.73 kg/m.     12/08/2023    3:27 PM 08/31/2023   11:45 AM 06/10/2023   10:23 AM 05/05/2023   10:59 AM 02/16/2023   11:24 AM 01/11/2023    9:57 AM 10/16/2022   12:17 PM  Advanced Directives  Does Patient Have a Medical Advance Directive? Yes No Yes Yes Yes  Yes Yes  Type of Estate agent of [place];Living will  Healthcare Power of [name];Living will Healthcare Power of [employer] of [place];Living will Healthcare Power of [employer] of [place];Living will  Does patient want to make changes to medical advance directive? No - Patient declined   No - Patient declined No - Patient declined No - Patient declined No - Patient declined  Copy of Healthcare Power of Attorney in Chart? Yes - validated most recent copy scanned in chart (See row information)   No - copy requested No - copy requested No - copy requested No - copy requested    Current Medications (verified) Outpatient Encounter Medications as of 12/08/2023  Medication Sig   acetaminophen  (TYLENOL ) 325 MG tablet Take 325 mg by mouth daily.   allopurinol  (ZYLOPRIM ) 300 MG tablet TAKE 1 TABLET BY MOUTH IN THE EVENING FOR GOUT   amiodarone  (PACERONE ) 200 MG tablet TAKE 1 TABLET BY MOUTH EVERY DAY   amLODipine  (NORVASC ) 5 MG tablet TAKE 1 TABLET (5 MG TOTAL) BY MOUTH DAILY.   atorvastatin  (LIPITOR) 20 MG tablet TAKE 1 TABLET BY MOUTH EVERY DAY FOR CHOLESTEROL   Calcium  Citrate-Vitamin D  (CALCIUM  + D PO) Take 1 tablet by mouth daily.   carboxymethylcellulose (REFRESH PLUS) 0.5 % SOLN Place 1 drop into both eyes 3 (three) times daily as needed (dry eyes).   docusate sodium  (COLACE) 100 MG capsule Take 1-3 capsules (100-300 mg total) by mouth daily as needed for mild constipation.   Emollient (RA RENEWAL DARK SPOT CORRECTOR EX) Apply 1 application topically daily  as needed (dark spots).   FLUoxetine  (PROZAC ) 40 MG capsule Take 1 capsule (40 mg total) by mouth daily.   Fluticasone  Propionate (ALLERGY RELIEF NA) Place 2 sprays into the nose daily as needed (allergies).   furosemide  (LASIX ) 40 MG tablet Take 40 mg by mouth daily.   hydrochlorothiazide  (HYDRODIURIL ) 12.5 MG tablet TAKE 1 TABLET BY MOUTH EVERY DAY   Lancets MISC Use to check blood  sugars daily. E11.9   latanoprost  (XALATAN ) 0.005 % ophthalmic solution Place 1 drop into both eyes at bedtime.   Levothyroxine  Sodium 175 MCG CAPS Take 175 mcg by mouth daily. Take 30 minutes prior to breakfast.   Lido-Menthol-Methyl Sal-Camph (CBD KINGS EX) Apply 1 Application topically as needed (pain).   Melatonin 5 MG TABS Take 5 mg by mouth at bedtime.   memantine  (NAMENDA ) 5 MG tablet TAKE 1 TABLET (5 MG AT NIGHT) BY MOUTH FOR 2 WEEKS, THEN INCREASE TO 1 TABLET (5 MG) TWICE A DAY   mirabegron  ER (MYRBETRIQ ) 25 MG TB24 tablet Take 25 mg by mouth daily.   Multiple Vitamin (MULTIVITAMIN) tablet Take 1 tablet by mouth daily.   nitroGLYCERIN  (NITROSTAT ) 0.4 MG SL tablet Place 1 tablet (0.4 mg total) under the tongue every 5 (five) minutes as needed for chest pain.   omeprazole  (PRILOSEC) 20 MG capsule TAKE 1 CAPSULE BY MOUTH EVERY DAY   oxyCODONE -acetaminophen  (PERCOCET) 10-325 MG tablet Take 1 tablet by mouth every 8 (eight) hours as needed for pain.   Probiotic Product (PROBIOTIC PO) Take 1 capsule by mouth daily.   Semaglutide  (RYBELSUS ) 7 MG TABS Take 7 mg by mouth daily. Via [employer] pt assistance   sildenafil (REVATIO) 20 MG tablet Take 40-100 mg by mouth daily as needed.   silodosin (RAPAFLO) 8 MG CAPS capsule Take 8 mg by mouth daily.   tamsulosin (FLOMAX) 0.4 MG CAPS capsule Take 0.4 mg by mouth daily.   telmisartan  (MICARDIS ) 40 MG tablet TAKE 1 TABLET BY MOUTH EVERY DAY   Varenicline Tartrate (TYRVAYA) 0.03 MG/ACT SOLN Place into the nose.   Vibegron (GEMTESA) 75 MG TABS Take 75 mg by mouth daily.   vitamin C  (ASCORBIC ACID ) 500 MG tablet Take 500 mg by mouth daily.   VITAMIN D , CHOLECALCIFEROL , PO Take 1 capsule by mouth daily.   No facility-administered encounter medications on file as of 12/08/2023.    Allergies (verified) Dust mite extract and Other   History: Past Medical History:  Diagnosis Date   Abdominal aortic atherosclerosis 02/11/2022   Acute encephalopathy  05/08/2019   Allergic rhinitis, seasonal 12/04/2011   Aortic atherosclerosis 02/04/2021   Arthritis of finger of both hands 03/27/2021   Atrial fibrillation (HCC)    BPH (benign prostatic hyperplasia)    Cataract    Chronic anticoagulation 12/21/2017   CHADS VASC=4, Eliquis  stopped Sept 2021- recurrent falls with SDH   Chronic pain, legs and back 03/10/2017   Preferred pain management Leg and back pain   Compression fracture of thoracic vertebra 09/21/2017   COPD (chronic obstructive pulmonary disease)    2 liters O2 HS   Degeneration of thoracic intervertebral disc 09/21/2017   Degenerative joint disease of hand 07/14/2017   Dupuytren's contracture 07/12/2019   Essential hypertension 12/04/2011   Fatty liver 12/29/2020   Fatty tumor    waste and back   Fibromyalgia    Generalized anxiety disorder 06/12/2020   12/21 He is reporting severe insomnia and anxiety symptoms.  Options are limited.  We will try a low-dose  lorazepam  at night and during the day for anxiety and panic attacks.  He will stop lorazepam  if problems.  He will stop drinking alcohol .  Discontinue BuSpar .  Reduce Cymbalta  to 1 a day.   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 Treat neuropathy,  insomnia.  Reduce Cymbalta  to 1 a day.  Discontinue BuSpar .   Rib pain on left side 07/25/2021   Rotator cuff arthropathy of left shoulder 07/21/2019   SDH (subdural hematoma) 03/21/2020   Recurrent SDH secondary to falls at home- Sept 2019 and again 03/14/2020- Eliquis  stopped.   Sebaceous cyst 06/27/2019   Spinal compression fracture seventh vertebre   Spondylolisthesis 09/07/2016   On fosamax  - for about one year - Dr Sherrilee  10/14/17 dexa: normal dexa -- spine 2.1,   RFN -0.9,  LFN   -0.7   - no comparison on file, previous fracture      Thoracic back pain 08/10/2017   Thrombocytopenia 12/20/2020   TIA (transient ischemic attack) 08/28/2015   Type 2 diabetes mellitus 03/08/2016   Past Surgical History:  Procedure Laterality Date   APPENDECTOMY  age 68   CARPAL TUNNEL RELEASE Right    early 2000s   CATARACT EXTRACTION     bilateral   CHOLECYSTECTOMY  age 54   EYE SURGERY     FOOT ARTHRODESIS Right 02/02/2013   Procedure: RIGHT HALLUX METATARSAL PHALANGEAL JOINT ARTHRODESIS ;  Surgeon: Norleen Armor, MD;  Location: [facility];  Service: Orthopedics;  Laterality: Right;   INCISION AND DRAINAGE ABSCESS Left 09/03/2022   Procedure: INCISION AND DRAINAGE LEFT FACIAL  ABSCESS;  Surgeon: Luciano Standing, MD;  Location: [facility] OR;  Service: ENT;  Laterality: Left;   INCISION AND DRAINAGE ABSCESS Left 09/07/2022   Procedure: IRRIGATION AND DEBRIDMENT OF LEFT FACIAL ABSCESS;  Surgeon: Luciano Standing, MD;  Location: [facility] OR;  Service: ENT;  Laterality: Left;   INGUINAL HERNIA REPAIR  age 20   rt side   IR ANGIO INTRA EXTRACRAN SEL COM CAROTID INNOMINATE BILAT MOD SED  06/10/2023   IR ANGIO VERTEBRAL SEL SUBCLAVIAN INNOMINATE UNI L MOD SED  06/10/2023   IR ANGIO VERTEBRAL SEL VERTEBRAL UNI R MOD SED  06/10/2023   IR RADIOLOGIST EVAL & MGMT  06/02/2023   NASAL CONCHA BULLOSA RESECTION  age 58   PROSTATE SURGERY     SHOULDER ARTHROSCOPY W/ ROTATOR CUFF REPAIR Right    early 2000s    tonsil     VASECTOMY  age 66   Family History  Problem Relation Age of Onset   Coronary artery disease Brother    Heart disease Father    Lung cancer Father    Kidney cancer Father    Prostate cancer Father  Arthritis Mother    Lung cancer Mother    Dementia Mother        Unspecified type, not Alzheimer's disease   Social History   Socioeconomic History   Marital status: Married    Spouse name: Heron   Number of children: 2   Years of education: 18   Highest education level: Master's degree (e.g., MA, MS, MEng, MEd, MSW, MBA)  Occupational History   Occupation: Retired  Tobacco Use   Smoking status: Former    Current packs/day: 0.00    Average packs/day: 2.0 packs/day for 10.0 years (20.0 ttl pk-yrs)    Types: Cigarettes    Start date: 07/17/1965    Quit date: 07/18/1975    Years since quitting: 48.4    Passive exposure: Past   Smokeless tobacco: Never  Vaping Use   Vaping status: Never Used  Substance and Sexual Activity   Alcohol  use: Not Currently    Comment: one - two drinks/week   Drug use: No   Sexual activity: Not Currently    Partners: Female    Comment: married  Other Topics Concern   Not on file  Social History Narrative   Right handed   Master degree   Drinks caffeine retired   Retired      Lives with wife and 2 cats/2025   Social Drivers of Health   Financial Resource Strain: Patient Declined (12/05/2023)   Overall Financial Resource Strain (CARDIA)    Difficulty of Paying Living Expenses: Patient declined  Food Insecurity: No Food Insecurity (12/05/2023)   Hunger Vital Sign    Worried About Running Out of Food in the Last Year: Never true    Ran Out of Food in the Last Year: Never true  Transportation Needs: No Transportation Needs (12/08/2023)   PRAPARE - Administrator, Civil Service (Medical): No    Lack of Transportation (Non-Medical): No  Physical Activity: Insufficiently Active (12/05/2023)   Exercise Vital Sign    Days of  Exercise per Week: 1 day    Minutes of Exercise per Session: 10 min  Stress: No Stress Concern Present (12/05/2023)   [employer] of Occupational Health - Occupational Stress Questionnaire    Feeling of Stress: Only a little  Social Connections: Unknown (06/17/2023)   Social Connection and Isolation Panel    Frequency of Communication with Friends and Family: More than three times a week    Frequency of Social Gatherings with Friends and Family: Three times a week    Attends Religious Services: Patient declined    Active Member of Clubs or Organizations: Yes    Attends Engineer, structural: More than 4 times per year    Marital Status: Divorced    Tobacco Counseling Counseling given: Not Answered    Clinical Intake:  Pre-visit preparation completed: Yes  Pain : 0-10 Pain Score: 8  Pain Type: Chronic pain Pain Location:  (both legs and back) Pain Descriptors / Indicators: Aching, Discomfort Pain Onset: More than a month ago Pain Frequency: Constant Pain Relieving Factors: oxycodone   Pain Relieving Factors: oxycodone   BMI - recorded: 28.73 Nutritional Status: BMI 25 -29 Overweight Nutritional Risks: Nausea/ vomitting/ diarrhea (last Sunday had some vomiting) Diabetes: Yes CBG done?: No Did pt. bring in CBG monitor from home?: No  Lab Results  Component Value Date   HGBA1C 6.6 (H) 06/21/2023   HGBA1C 6.2 11/19/2022   HGBA1C 7.1 (H) 05/20/2022     How often do you need to have  someone help you when you read instructions, pamphlets, or other written materials from your doctor or pharmacy?: 1 - Never  Interpreter Needed?: No  Information entered by :: Kalieb Freeland, RMA   Activities of Daily Living     12/05/2023    6:53 PM  In your present state of health, do you have any difficulty performing the following activities:  Hearing? 1  Comment Wears hearing aides  Vision? 0  Difficulty concentrating or making decisions? 1  Walking or climbing stairs?  0  Dressing or bathing? 0  Doing errands, shopping? 0  Preparing Food and eating ? N  Using the Toilet? N  In the past six months, have you accidently leaked urine? N  Do you have problems with loss of bowel control? N  Managing your Medications? N  Managing your Finances? N  Housekeeping or managing your Housekeeping? N    Patient Care Team: Geofm Glade PARAS, MD as PCP - General (Internal Medicine) Pietro Redell RAMAN, MD as PCP - Cardiology (Cardiology) Inocencio Soyla Lunger, MD as PCP - Electrophysiology (Cardiology) Ernesto Alm BRAVO, MD as Referring Physician (Family Medicine) Louis Shove, MD as Consulting Physician (Neurosurgery) Pietro Redell RAMAN, MD as Consulting Physician (Cardiology) Sheldon Standing, MD as Consulting Physician (General Surgery) Baldwin Ubaldo Motto, NP as Nurse Practitioner (Nurse Practitioner) Fate Morna SAILOR, [facility] (Inactive) as Pharmacist (Pharmacist) I have updated your Care Teams any recent Medical Services you may have received from other providers in the past year.     Assessment:   This is a routine wellness examination for [place].  Hearing/Vision screen Hearing Screening - Comments:: Wears hearing aides Vision Screening - Comments:: Wears eyeglasses/DR. Bowen   Goals Addressed             This Visit's Progress    My goal for 2024 is to continue with physical therapy to improve mobility.   Not on track    Would like to keep same goal/2025       Depression Screen     12/08/2023    3:39 PM 06/21/2023   10:39 AM 12/04/2022    1:47 PM 11/23/2022    8:14 AM 09/17/2022   11:10 AM 09/16/2022   10:12 AM 09/02/2022    9:26 AM  PHQ 2/9 Scores  PHQ - 2 Score 3 3 4 4  0 1 1  PHQ- 9 Score 9 8 10 12 3 3 3     Fall Risk     12/05/2023    6:53 PM 08/31/2023   11:45 AM 06/21/2023   10:25 AM 05/05/2023   10:59 AM 01/11/2023    9:56 AM  Fall Risk   Falls in the past year? 1 0 0 0 1  Number falls in past yr:  0 0 0 1  Injury with Fall?  0 0 0 0  Risk for  fall due to :   No Fall Risks    Follow up Falls evaluation completed;Falls prevention discussed Falls evaluation completed Falls evaluation completed Falls evaluation completed Falls evaluation completed    MEDICARE RISK AT HOME:  Medicare Risk at Home Any stairs in or around the home?: (Patient-Rptd) No Home free of loose throw rugs in walkways, pet beds, electrical cords, etc?: (Patient-Rptd) Yes Adequate lighting in your home to reduce risk of falls?: (Patient-Rptd) Yes Life alert?: (Patient-Rptd) No Use of a cane, walker or w/c?: (Patient-Rptd) No Grab bars in the bathroom?: (Patient-Rptd) Yes Shower chair or bench in shower?: (Patient-Rptd) No Elevated toilet seat or  a handicapped toilet?: (Patient-Rptd) Yes  TIMED UP AND GO:  Was the test performed?  No  Cognitive Function: 6CIT completed    04/26/2019    2:53 PM 04/20/2018   11:40 AM 04/19/2017   10:51 AM 08/28/2015    3:20 PM  MMSE - Mini Mental State Exam  Not completed: Refused  Refused   Orientation to time  5  5   Orientation to Place  5  5   Registration  3  3   Attention/ Calculation  5  5   Recall  2  2   Language- name 2 objects  2  2   Language- repeat  1  1  Language- follow 3 step command  3  3   Language- read & follow direction  1  1   Write a sentence  1  1   Copy design  1  1   Total score  29  29      Data saved with a previous flowsheet row definition      04/20/2022    2:00 PM 07/30/2021    8:00 AM  Montreal Cognitive Assessment   Visuospatial/ Executive (0/5) 4 4  Naming (0/3) 3 3  Attention: Read list of digits (0/2) 2 2  Attention: Read list of letters (0/1) 1 1  Attention: Serial 7 subtraction starting at 100 (0/3) 3 3  Language: Repeat phrase (0/2) 2 2  Language : Fluency (0/1) 1 1  Abstraction (0/2) 2 1  Delayed Recall (0/5) 4 4  Orientation (0/6) 6 6  Total 28 27  Adjusted Score (based on education) 28 27      12/08/2023    3:38 PM 09/16/2022   10:16 AM 04/26/2020    9:44 AM   6CIT Screen  What Year? 0 points 0 points 0 points  What month? 0 points 0 points 0 points  What time? 0 points 0 points 0 points  Count back from 20 0 points 0 points 0 points  Months in reverse 0 points 0 points 0 points  Repeat phrase 0 points 0 points 0 points  Total Score 0 points 0 points 0 points    Immunizations Immunization History  Administered Date(s) Administered   Fluad Quad(high Dose 65+) 03/25/2020, 04/07/2021, 03/28/2022, 03/10/2023   Hepatitis A 07/25/2007, 01/22/2008   Hepatitis B 04/30/1988, 06/02/1988, 10/28/1988   IPV 05/15/1996   Influenza Split 02/19/2012, 03/13/2016   Influenza Whole 02/17/2011, 03/13/2013   Influenza, High Dose Seasonal PF 03/10/2017, 02/15/2018, 02/09/2019   Influenza,inj,Quad PF,6+ Mos 03/15/2015   Influenza,inj,quad, With Preservative 02/14/2019   Meningococcal polysaccharide vaccine (MPSV4) 05/15/1996   Moderna Covid-19 Fall Seasonal Vaccine 33yrs & older 12/16/2022   Moderna Covid-19 Vaccine  Bivalent Booster 75yrs & up 02/25/2021, 10/06/2021   Moderna Sars-Covid-2 Vaccination 07/15/2019, 08/12/2019, 02/15/2020, 07/25/2020   PNEUMOCOCCAL CONJUGATE-20 07/09/2021   Pfizer(Comirnaty)Fall Seasonal Vaccine 12 years and older 03/09/2022   Pneumococcal Conjugate-13 12/08/2016   Pneumococcal-Unspecified 02/01/2006   Respiratory Syncytial Virus Vaccine,Recomb Aduvanted(Arexvy) 04/25/2022   Td 01/14/1995   Tdap 03/06/2018   Zoster Recombinant(Shingrix) 01/15/2017, 01/04/2018   Zoster, Live 02/01/2006    Screening Tests Health Maintenance  Topic Date Due   COVID-19 Vaccine (9 - 2024-25 season) 02/14/2023   OPHTHALMOLOGY EXAM  04/29/2023   HEMOGLOBIN A1C  12/19/2023   INFLUENZA VACCINE  01/14/2024   FOOT EXAM  06/20/2024   Medicare Annual Wellness (AWV)  12/07/2024   DTaP/Tdap/Td (3 - Td or Tdap) 03/06/2028   Pneumococcal Vaccine: 50+  Years  Completed   Hepatitis B Vaccines  Completed   Zoster Vaccines- Shingrix  Completed   HPV  VACCINES  Aged Out   Meningococcal B Vaccine  Aged Out    Health Maintenance  Health Maintenance Due  Topic Date Due   COVID-19 Vaccine (9 - 2024-25 season) 02/14/2023   OPHTHALMOLOGY EXAM  04/29/2023   Health Maintenance Items Addressed: See Nurse Notes at the end of this note  Additional Screening:  Vision Screening: Recommended annual ophthalmology exams for early detection of glaucoma and other disorders of the eye. Would you like a referral to an eye doctor? No    Dental Screening: Recommended annual dental exams for proper oral hygiene  Community Resource Referral / Chronic Care Management: CRR required this visit?  No   CCM required this visit?  No   Plan:    I have personally reviewed and noted the following in the patient's chart:   Medical and social history Use of alcohol , tobacco or illicit drugs  Current medications and supplements including opioid prescriptions. Patient is currently taking opioid prescriptions. Information provided to patient regarding non-opioid alternatives. Patient advised to discuss non-opioid treatment plan with their provider. Functional ability and status Nutritional status Physical activity Advanced directives List of other physicians Hospitalizations, surgeries, and ER visits in previous 12 months Vitals Screenings to include cognitive, depression, and falls Referrals and appointments  In addition, I have reviewed and discussed with patient certain preventive protocols, quality metrics, and best practice recommendations. A written personalized care plan for preventive services as well as general preventive health recommendations were provided to patient.   Saphire Barnhart L Wylder Macomber, CMA   12/08/2023  After Visit Summary: (MyChart) Due to this being a telephonic visit, the after visit summary with patients personalized plan was offered to patient via MyChart   Notes: Patient stated that he had a recent eye exam.  I have sent a request  for his last eye exam to Dr. Johnella office.  Patient is up to date on all other health maintenance with no concerns to address today.

## 2023-12-14 DIAGNOSIS — J449 Chronic obstructive pulmonary disease, unspecified: Secondary | ICD-10-CM | POA: Diagnosis not present

## 2023-12-20 ENCOUNTER — Encounter (HOSPITAL_COMMUNITY): Payer: Self-pay | Admitting: Cardiology

## 2023-12-22 ENCOUNTER — Encounter: Payer: Medicare HMO | Admitting: Internal Medicine

## 2023-12-27 ENCOUNTER — Ambulatory Visit

## 2024-01-03 ENCOUNTER — Ambulatory Visit: Payer: PPO

## 2024-01-04 ENCOUNTER — Other Ambulatory Visit: Payer: Self-pay | Admitting: Internal Medicine

## 2024-01-14 DIAGNOSIS — J449 Chronic obstructive pulmonary disease, unspecified: Secondary | ICD-10-CM | POA: Diagnosis not present

## 2024-01-25 DIAGNOSIS — R351 Nocturia: Secondary | ICD-10-CM | POA: Diagnosis not present

## 2024-01-25 DIAGNOSIS — N401 Enlarged prostate with lower urinary tract symptoms: Secondary | ICD-10-CM | POA: Diagnosis not present

## 2024-01-25 DIAGNOSIS — R350 Frequency of micturition: Secondary | ICD-10-CM | POA: Diagnosis not present

## 2024-01-27 ENCOUNTER — Ambulatory Visit (INDEPENDENT_AMBULATORY_CARE_PROVIDER_SITE_OTHER)

## 2024-01-27 DIAGNOSIS — R55 Syncope and collapse: Secondary | ICD-10-CM

## 2024-01-27 LAB — CUP PACEART REMOTE DEVICE CHECK
Date Time Interrogation Session: 20250813232648
Implantable Pulse Generator Implant Date: 20230612

## 2024-01-28 DIAGNOSIS — M47814 Spondylosis without myelopathy or radiculopathy, thoracic region: Secondary | ICD-10-CM | POA: Diagnosis not present

## 2024-01-29 ENCOUNTER — Ambulatory Visit: Payer: Self-pay | Admitting: Cardiology

## 2024-02-07 ENCOUNTER — Ambulatory Visit: Payer: PPO

## 2024-02-09 ENCOUNTER — Telehealth: Payer: Self-pay

## 2024-02-09 ENCOUNTER — Ambulatory Visit: Payer: Self-pay

## 2024-02-09 DIAGNOSIS — G894 Chronic pain syndrome: Secondary | ICD-10-CM | POA: Diagnosis not present

## 2024-02-09 DIAGNOSIS — R45851 Suicidal ideations: Secondary | ICD-10-CM | POA: Diagnosis not present

## 2024-02-09 DIAGNOSIS — Z79899 Other long term (current) drug therapy: Secondary | ICD-10-CM | POA: Diagnosis not present

## 2024-02-09 DIAGNOSIS — M5459 Other low back pain: Secondary | ICD-10-CM | POA: Diagnosis not present

## 2024-02-09 DIAGNOSIS — M546 Pain in thoracic spine: Secondary | ICD-10-CM | POA: Diagnosis not present

## 2024-02-09 DIAGNOSIS — M5416 Radiculopathy, lumbar region: Secondary | ICD-10-CM | POA: Diagnosis not present

## 2024-02-09 NOTE — Telephone Encounter (Signed)
 Copied from CRM [id number]. Topic: General - Other >> Feb 09, 2024  4:21 PM Chiquita SQUIBB wrote: Reason for CRM: Lauraine NP provider at [facility]. With Dr. Bonner, called in stating she saw the patient today and would like to discuss what she did for him with his care and the patient did state he is suicidal due to the pain she did ask questions and encouraged him to go to the ED. She would like to discuss further with Dr. Geofm her number is [phone] she is there tomorrow 7:30 to 5:00 and to leave a message at the front desk if she is not available. >> Feb 09, 2024  4:36 PM Chiquita SQUIBB wrote: Red word CRM also sent to NT as directed by the support chat.

## 2024-02-09 NOTE — Telephone Encounter (Signed)
 Copied from CRM [id number]. Topic: Clinical - Red Word Triage >> Feb 09, 2024  4:34 PM Chiquita SQUIBB wrote: Red Word that prompted transfer to Nurse Triage: Lauraine NP provider at [facility]. With Dr. Bonner, called in stating she saw the patient today for a follow up, but the patient did state he is suicidal due to the pain she did ask questions and encouraged him to go to the ED, but the patient denied going.

## 2024-02-09 NOTE — Telephone Encounter (Signed)
 FYI Only or Action Required?: Action required by provider: clinical question for provider and update on patient condition.  Patient was last seen in primary care on 06/21/2023 by Geofm Glade PARAS, MD.  Called Nurse Triage reporting Mental Health Problem.  Symptoms began today.  Interventions attempted: Prescription medications: oxycodone  for the pain.  Symptoms are: unchanged.  Triage Disposition: No disposition on file.  Patient/caregiver understands and will follow disposition?: Answer Assessment - Initial Assessment Questions 1. MAIN CONCERN: What happened that made you call today?     I am in pain, not depressed 2. RISK OF HARM - SUICIDAL IDEATION:  Do you ever have thoughts of hurting or killing yourself?  (e.g., yes, no, no but preoccupation with thoughts about death)     denies 3. RISK OF HARM - SUICIDE ATTEMPT: Have you tried to harm yourself recently? If Yes, ask: When was this?  What type of harm was tried?     denies 4. RISK OF HARM - SUICIDAL BEHAVIOR: Have you ever done anything, started to do anything, or prepared to do anything to end your life? (e.g., collected pills, bought a gun, wrote a suicide note, cut yourself, started but changed your mind)     denies 5. EVENTS AND STRESSORS: Has there been any new stress or recent changes in your life? (e.g., death of loved one, homelessness, negative event, relationship breakup, work)     pain 10. OTHER: Do you have any other physical symptoms right now? (e.g., fever)       Pain, no depression.  Pt states that he made the statements earlier d/t pain. Pt states that [facility] had placed rx for dilaudid , but states his pharmacy does not currently have it nor can they fill it at this time.  Pt states that he has reached out to [facility] to speak to them about this but states that he cannot make contact with them. This RN also placed a call to [facility] asking they return the pts phone call. Pt states that he does  not currently have a plan. Pt confirmed numerous times that he does not currently have a plan to harm himself nor anyone else. Pt advised that he needs to call us  back or go to the ED if he has the SI/HI thoughts.  Protocols used: Suicide Concerns-A-AH

## 2024-02-10 ENCOUNTER — Other Ambulatory Visit (HOSPITAL_COMMUNITY): Payer: Self-pay

## 2024-02-10 MED ORDER — HYDROMORPHONE HCL 4 MG PO TABS
4.0000 mg | ORAL_TABLET | Freq: Four times a day (QID) | ORAL | 0 refills | Status: DC | PRN
  Filled 2024-02-10: qty 20, 5d supply, fill #0

## 2024-02-11 ENCOUNTER — Other Ambulatory Visit: Payer: Self-pay | Admitting: Internal Medicine

## 2024-02-11 NOTE — Telephone Encounter (Signed)
 Tried to call over to [facility] to speak with Sarah-was not able to get through.

## 2024-02-11 NOTE — Telephone Encounter (Signed)
 He is doing better on his new pain medication and does not have any intentions of hurting himself

## 2024-02-11 NOTE — Telephone Encounter (Signed)
 I tried to call [facility] to speak with the nurse practitioner, but was not able to get through.  Can you follow-up with dog and see if he was able to get through and explain to them what he was feeling

## 2024-02-11 NOTE — Telephone Encounter (Signed)
 He did mention to Tobias that he feels better on the Dilaudid .  Has no intentions of hurting himself.

## 2024-02-14 DIAGNOSIS — J449 Chronic obstructive pulmonary disease, unspecified: Secondary | ICD-10-CM | POA: Diagnosis not present

## 2024-02-15 DIAGNOSIS — M5414 Radiculopathy, thoracic region: Secondary | ICD-10-CM | POA: Diagnosis not present

## 2024-02-16 ENCOUNTER — Other Ambulatory Visit (HOSPITAL_COMMUNITY): Payer: Self-pay

## 2024-02-16 MED ORDER — HYDROMORPHONE HCL 4 MG PO TABS
4.0000 mg | ORAL_TABLET | Freq: Four times a day (QID) | ORAL | 0 refills | Status: DC
  Filled 2024-02-16: qty 28, 7d supply, fill #0

## 2024-02-17 ENCOUNTER — Inpatient Hospital Stay: Attending: Oncology

## 2024-02-17 ENCOUNTER — Inpatient Hospital Stay (HOSPITAL_BASED_OUTPATIENT_CLINIC_OR_DEPARTMENT_OTHER): Admitting: Oncology

## 2024-02-17 VITALS — BP 127/54 | HR 74 | Temp 97.8°F | Resp 18 | Ht 66.0 in | Wt 173.6 lb

## 2024-02-17 DIAGNOSIS — K668 Other specified disorders of peritoneum: Secondary | ICD-10-CM | POA: Insufficient documentation

## 2024-02-17 DIAGNOSIS — D696 Thrombocytopenia, unspecified: Secondary | ICD-10-CM

## 2024-02-17 DIAGNOSIS — D72829 Elevated white blood cell count, unspecified: Secondary | ICD-10-CM | POA: Insufficient documentation

## 2024-02-17 DIAGNOSIS — G4730 Sleep apnea, unspecified: Secondary | ICD-10-CM | POA: Diagnosis not present

## 2024-02-17 DIAGNOSIS — I4891 Unspecified atrial fibrillation: Secondary | ICD-10-CM | POA: Diagnosis not present

## 2024-02-17 DIAGNOSIS — F419 Anxiety disorder, unspecified: Secondary | ICD-10-CM | POA: Insufficient documentation

## 2024-02-17 DIAGNOSIS — D649 Anemia, unspecified: Secondary | ICD-10-CM | POA: Insufficient documentation

## 2024-02-17 DIAGNOSIS — E039 Hypothyroidism, unspecified: Secondary | ICD-10-CM | POA: Insufficient documentation

## 2024-02-17 DIAGNOSIS — E1140 Type 2 diabetes mellitus with diabetic neuropathy, unspecified: Secondary | ICD-10-CM | POA: Diagnosis not present

## 2024-02-17 DIAGNOSIS — M109 Gout, unspecified: Secondary | ICD-10-CM | POA: Insufficient documentation

## 2024-02-17 DIAGNOSIS — H409 Unspecified glaucoma: Secondary | ICD-10-CM | POA: Diagnosis not present

## 2024-02-17 DIAGNOSIS — Z7952 Long term (current) use of systemic steroids: Secondary | ICD-10-CM | POA: Insufficient documentation

## 2024-02-17 LAB — CBC WITH DIFFERENTIAL (CANCER CENTER ONLY)
Abs Immature Granulocytes: 1.43 K/uL — ABNORMAL HIGH (ref 0.00–0.07)
Basophils Absolute: 0.1 K/uL (ref 0.0–0.1)
Basophils Relative: 0 %
Eosinophils Absolute: 0 K/uL (ref 0.0–0.5)
Eosinophils Relative: 0 %
HCT: 33.5 % — ABNORMAL LOW (ref 39.0–52.0)
Hemoglobin: 10.8 g/dL — ABNORMAL LOW (ref 13.0–17.0)
Immature Granulocytes: 7 %
Lymphocytes Relative: 9 %
Lymphs Abs: 1.9 K/uL (ref 0.7–4.0)
MCH: 30.1 pg (ref 26.0–34.0)
MCHC: 32.2 g/dL (ref 30.0–36.0)
MCV: 93.3 fL (ref 80.0–100.0)
Monocytes Absolute: 8.3 K/uL — ABNORMAL HIGH (ref 0.1–1.0)
Monocytes Relative: 38 %
Neutro Abs: 9.9 K/uL — ABNORMAL HIGH (ref 1.7–7.7)
Neutrophils Relative %: 46 %
Platelet Count: 49 K/uL — ABNORMAL LOW (ref 150–400)
RBC: 3.59 MIL/uL — ABNORMAL LOW (ref 4.22–5.81)
RDW: 14.6 % (ref 11.5–15.5)
Smear Review: NORMAL
WBC Count: 21.6 K/uL — ABNORMAL HIGH (ref 4.0–10.5)
nRBC: 0 % (ref 0.0–0.2)

## 2024-02-17 NOTE — Progress Notes (Signed)
  [facility] OFFICE PROGRESS NOTE   Diagnosis: Thrombocytopenia  INTERVAL HISTORY:   Jonathan [name] returns as scheduled.  He reports increased bruising over the arms.  No other bleeding.  He has occasional sweats of the chest at night.  His chief complaint is chronic back pain.  He underwent an ablation procedure in July.  The pain has not improved.  He completed a 6-day prednisone  taper yesterday.  He has 3 ounces of liquor each night.  He reports no history of heavy alcohol  use.  Objective:  Vital signs in last 24 hours:  Blood pressure (!) 127/54, pulse 74, temperature 97.8 F (36.6 C), temperature source Temporal, resp. rate 18, height 5' 6 (1.676 m), weight 173 lb 9.6 oz (78.7 kg), SpO2 98%.    Lymphatics: No cervical, supraclavicular, axillary, or inguinal nodes Resp: Lungs clear bilaterally Cardio: Regular rate and rhythm GI: No hepatosplenomegaly Vascular: Leg edema  Skin: Ecchymoses at the forearm bilaterally, brown discoloration of the lower leg bilaterally  Lab Results:  Lab Results  Component Value Date   WBC 21.6 (H) 02/17/2024   HGB 10.8 (L) 02/17/2024   HCT 33.5 (L) 02/17/2024   MCV 93.3 02/17/2024   PLT 49 (L) 02/17/2024   NEUTROABS 9.9 (H) 02/17/2024    CMP  Lab Results  Component Value Date   NA 143 06/21/2023   K 4.8 06/21/2023   CL 105 06/21/2023   CO2 29 06/21/2023   GLUCOSE 103 (H) 06/21/2023   BUN 19 06/21/2023   CREATININE 1.25 06/21/2023   CALCIUM  9.5 06/21/2023   PROT 6.4 06/21/2023   ALBUMIN  4.5 06/21/2023   AST 19 06/21/2023   ALT 16 06/21/2023   ALKPHOS 105 06/21/2023   BILITOT 0.6 06/21/2023   GFRNONAA 50 (L) 06/10/2023   GFRAA >60 03/14/2020    No results found for: CEA1, CEA, CAN199, CA125  Lab Results  Component Value Date   INR 1.1 06/10/2023   LABPROT 13.9 06/10/2023    Imaging:  No results found.  Medications: I have reviewed the patient's current  medications.   Assessment/Plan: Thrombocytopenia-chronic   Atrial fibrillation-apixaban  discontinued May 2023 due to recurrent falls/syncope Hypertension Fibromyalgia Diabetes Hypothyroidism Anxiety Gout Neuropathy Sleep apnea-CPAP Glaucoma Mild elevation of monocyte count Small cutaneous cyst at the perineum Left facial abscess, status post I&D March 2024 Mild normocytic anemia-chronic     Disposition: [name] has chronic thrombocytopenia.  The thrombocytopenia has progressed and he now has anemia and increased leukocytosis.  The leukocytosis may be related to recent prednisone  therapy.  The etiology of the hematologic abnormalities is unclear.  Cirrhosis remains in the differential diagnosis, but he has no obvious sign of cirrhosis on physical exam and an abdominal ultrasound in 2022 did not suggest cirrhosis.  He may have myelodysplasia or a hematopoietic malignancy.  I recommended diagnostic bone marrow biopsy.  He agrees.  He will be scheduled for a bone marrow biopsy within the next 2 weeks and then return a follow-up visit in 3-4 weeks.  Arley Hof, MD  02/17/2024  11:16 AM

## 2024-02-20 ENCOUNTER — Other Ambulatory Visit: Payer: Self-pay | Admitting: Internal Medicine

## 2024-02-22 ENCOUNTER — Other Ambulatory Visit (HOSPITAL_COMMUNITY): Payer: Self-pay

## 2024-02-22 ENCOUNTER — Telehealth: Payer: Self-pay

## 2024-02-22 MED ORDER — HYDROMORPHONE HCL 4 MG PO TABS
4.0000 mg | ORAL_TABLET | Freq: Four times a day (QID) | ORAL | 0 refills | Status: DC | PRN
  Filled 2024-02-22: qty 28, 7d supply, fill #0

## 2024-02-22 NOTE — Telephone Encounter (Signed)
 Rybelus 7 mg arrived today.  Message left for patient and my-chart sent as well regarding pickup.  Script left up front.

## 2024-02-24 ENCOUNTER — Encounter: Payer: Self-pay | Admitting: Internal Medicine

## 2024-02-25 ENCOUNTER — Encounter: Payer: Self-pay | Admitting: Cardiology

## 2024-02-28 ENCOUNTER — Ambulatory Visit (INDEPENDENT_AMBULATORY_CARE_PROVIDER_SITE_OTHER)

## 2024-02-28 DIAGNOSIS — R55 Syncope and collapse: Secondary | ICD-10-CM

## 2024-02-29 ENCOUNTER — Ambulatory Visit: Admitting: Radiology

## 2024-02-29 LAB — CUP PACEART REMOTE DEVICE CHECK
Date Time Interrogation Session: 20250914233237
Implantable Pulse Generator Implant Date: 20230612

## 2024-03-06 ENCOUNTER — Ambulatory Visit: Payer: Self-pay | Admitting: Cardiology

## 2024-03-06 ENCOUNTER — Other Ambulatory Visit: Payer: Self-pay | Admitting: Radiology

## 2024-03-06 DIAGNOSIS — D696 Thrombocytopenia, unspecified: Secondary | ICD-10-CM

## 2024-03-06 NOTE — Progress Notes (Signed)
 Remote Loop Recorder Transmission

## 2024-03-06 NOTE — H&P (Signed)
 Chief Complaint: Chronic progressive thrombocytopenia, anemia, increased leukocytosis; referred for image guided bone marrow biopsy for further evaluation  Referring Provider(s): Sherrill,B  Supervising Physician: Vanice Revel  Patient Status: [facility] - Out-pt  History of Present Illness: Jonathan [name] is an 89 y.o. male with past medical history of atrial fibrillation, BPH, chronic back pain, COPD, degenerative joint disease, Dupuytren's contracture, hypertension, fatty liver, fibromyalgia, anxiety, GERD, glaucoma, gout, peripheral neuropathy, hyperlipidemia, hypothyroidism, hearing loss, depression, obesity, sleep apnea, osteoarthritis, prior subdural hematoma, cerebrovascular disease, prior TIA, diabetes, left facial abscess with prior I&D in March 2024 . He presents now with chronic progressive thrombocytopenia, anemia and increased leukocytosis of uncertain etiology.  He is scheduled today for image guided bone marrow biopsy for further evaluation.  *** Patient is Full Code  Past Medical History:  Diagnosis Date   Abdominal aortic atherosclerosis 02/11/2022   Acute encephalopathy 05/08/2019   Allergic rhinitis, seasonal 12/04/2011   Aortic atherosclerosis 02/04/2021   Arthritis of finger of both hands 03/27/2021   Atrial fibrillation (HCC)    BPH (benign prostatic hyperplasia)    Cataract    Chronic anticoagulation 12/21/2017   CHADS VASC=4, Eliquis  stopped Sept 2021- recurrent falls with SDH   Chronic pain, legs and back 03/10/2017   Preferred pain management Leg and back pain   Compression fracture of thoracic vertebra 09/21/2017   COPD (chronic obstructive pulmonary disease)    2 liters O2 HS   Degeneration of thoracic intervertebral disc 09/21/2017   Degenerative joint disease of hand 07/14/2017   Dupuytren's contracture 07/12/2019   Essential hypertension 12/04/2011   Fatty liver 12/29/2020   Fatty tumor    waste and back   Fibromyalgia    Generalized anxiety  disorder 06/12/2020   12/21 He is reporting severe insomnia and anxiety symptoms.  Options are limited.  We will try a low-dose lorazepam  at night and during the day for anxiety and panic attacks.  He will stop lorazepam  if problems.  He will stop drinking alcohol .  Discontinue BuSpar .  Reduce Cymbalta  to 1 a day.   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 Treat neuropathy, insomnia.  Reduce Cymbalta  to 1 a day.  Discontinue BuSpar .   Rib pain on left side 07/25/2021   Rotator cuff arthropathy of left shoulder 07/21/2019   SDH (subdural hematoma) 03/21/2020   Recurrent SDH secondary to falls at home- Sept 2019 and again 03/14/2020- Eliquis  stopped.   Sebaceous cyst 06/27/2019   Spinal compression fracture seventh vertebre   Spondylolisthesis 09/07/2016   On fosamax  - for about one year - Dr Sherrilee  10/14/17 dexa: normal dexa -- spine 2.1,   RFN -0.9,  LFN   -0.7   - no comparison on file, previous fracture  Thoracic back pain 08/10/2017   Thrombocytopenia 12/20/2020   TIA (transient ischemic attack) 08/28/2015   Type 2 diabetes mellitus 03/08/2016    Past Surgical History:  Procedure Laterality Date   APPENDECTOMY  age 39   CARPAL TUNNEL RELEASE Right    early  2000s   CATARACT EXTRACTION     bilateral   CHOLECYSTECTOMY  age 27   EYE SURGERY     FOOT ARTHRODESIS Right 02/02/2013   Procedure: RIGHT HALLUX METATARSAL PHALANGEAL JOINT ARTHRODESIS ;  Surgeon: Norleen Armor, MD;  Location: [facility];  Service: Orthopedics;  Laterality: Right;   INCISION AND DRAINAGE ABSCESS Left 09/03/2022   Procedure: INCISION AND DRAINAGE LEFT FACIAL  ABSCESS;  Surgeon: Luciano Standing, MD;  Location: [facility] OR;  Service: ENT;  Laterality: Left;   INCISION AND DRAINAGE ABSCESS Left 09/07/2022   Procedure: IRRIGATION AND DEBRIDMENT OF LEFT FACIAL ABSCESS;  Surgeon: Luciano Standing, MD;  Location: [facility] OR;  Service: ENT;  Laterality: Left;   INGUINAL HERNIA REPAIR  age 38   rt side   IR ANGIO INTRA EXTRACRAN SEL COM CAROTID INNOMINATE BILAT MOD SED  06/10/2023   IR ANGIO VERTEBRAL SEL SUBCLAVIAN INNOMINATE UNI L MOD SED  06/10/2023   IR ANGIO VERTEBRAL SEL VERTEBRAL UNI R MOD SED  06/10/2023   IR RADIOLOGIST EVAL & MGMT  06/02/2023   NASAL CONCHA BULLOSA RESECTION  age 88   PROSTATE SURGERY     SHOULDER ARTHROSCOPY W/ ROTATOR CUFF REPAIR Right    early 2000s   tonsil     VASECTOMY  age 36    Allergies: Dust mite extract and Other  Medications: Prior to Admission medications   Medication Sig Start Date End Date Taking? Authorizing Provider  acetaminophen  (TYLENOL ) 325 MG tablet Take 325 mg by mouth daily.    [provider]  allopurinol  (ZYLOPRIM ) 300 MG tablet TAKE 1 TABLET BY MOUTH IN THE EVENING FOR GOUT 11/25/23   Geofm Glade PARAS, MD  amiodarone  (PACERONE ) 200 MG tablet TAKE 1 TABLET BY MOUTH EVERY DAY 09/28/23   Pietro Redell RAMAN, MD  amLODipine  (NORVASC ) 5 MG tablet TAKE 1 TABLET (5 MG TOTAL) BY MOUTH DAILY. 07/20/23   Pietro Redell RAMAN, MD  atorvastatin  (LIPITOR) 20 MG tablet TAKE 1 TABLET BY MOUTH EVERY DAY FOR CHOLESTEROL 02/11/24   Burns, Glade PARAS, MD  Calcium  Citrate-Vitamin D  (CALCIUM  + D PO) Take 1 tablet by mouth daily.    [provider]  carboxymethylcellulose (REFRESH PLUS) 0.5 % SOLN Place 1 drop into both eyes 3 (three) times daily as needed (dry eyes).    [provider]  docusate sodium  (COLACE) 100 MG capsule Take 1-3 capsules (100-300 mg total) by mouth daily as needed for mild constipation. 11/10/23   Burns, Glade PARAS, MD  Emollient (RA RENEWAL DARK SPOT CORRECTOR EX) Apply 1 application topically daily as needed (dark spots).    [provider]  FLUoxetine  (PROZAC ) 40 MG capsule Take 1 capsule (40 mg total) by mouth daily. 06/21/23   Geofm Glade PARAS, MD  Fluticasone  Propionate (ALLERGY RELIEF NA) Place 2 sprays into the nose daily as needed (allergies).    [provider]  furosemide  (LASIX ) 40 MG tablet Take 40 mg by mouth daily.    [provider]  hydrochlorothiazide  (HYDRODIURIL ) 12.5 MG tablet TAKE 1 TABLET BY MOUTH EVERY DAY 05/17/23   Geofm Glade PARAS, MD  HYDROmorphone  (DILAUDID ) 4 MG tablet Take 1 tablet (4 mg total) by mouth 4 (  four) times daily as needed. 02/22/24     Lancets MISC Use to check blood sugars daily. E11.9 11/08/20   Geofm Glade PARAS, MD  latanoprost  (XALATAN ) 0.005 % ophthalmic solution Place 1 drop into both eyes at bedtime. 04/18/21   [provider]  levothyroxine  (SYNTHROID ) 175 MCG tablet TAKE 1 TABLET BY MOUTH DAILY. TAKE 30 MINUTES PRIOR TO BREAKFAST. 01/04/24   Geofm Glade PARAS, MD  Lido-Menthol-Methyl Sal-Camph (CBD KINGS EX) Apply 1 Application topically as needed (pain).    [provider]  Melatonin 5 MG TABS Take 5 mg by mouth at bedtime.    [provider]  memantine  (NAMENDA ) 5 MG tablet TAKE 1 TABLET (5 MG AT NIGHT) BY MOUTH FOR 2 WEEKS, THEN INCREASE TO 1 TABLET (5 MG) TWICE A DAY 09/23/23   Dina, Sara E, PA-C  mirabegron  ER (MYRBETRIQ ) 25 MG TB24 tablet Take 25 mg by mouth daily.    [provider]  Multiple Vitamin (MULTIVITAMIN) tablet Take 1 tablet by mouth daily.    [provider]  nitroGLYCERIN   (NITROSTAT ) 0.4 MG SL tablet Place 1 tablet (0.4 mg total) under the tongue every 5 (five) minutes as needed for chest pain. Patient not taking: Reported on 02/17/2024 11/25/23   Lavona Agent, MD  omeprazole  (PRILOSEC) 20 MG capsule TAKE 1 CAPSULE BY MOUTH EVERY DAY 02/21/24   Geofm Glade PARAS, MD  oxyCODONE -acetaminophen  (PERCOCET) 10-325 MG tablet Take 1 tablet by mouth every 8 (eight) hours as needed for pain.    [provider]  Probiotic Product (PROBIOTIC PO) Take 1 capsule by mouth daily.    [provider]  Semaglutide  (RYBELSUS ) 7 MG TABS Take 7 mg by mouth daily. Via [employer] pt assistance 03/25/21   Geofm Glade PARAS, MD  sildenafil (REVATIO) 20 MG tablet Take 40-100 mg by mouth daily as needed. 12/01/19   [provider]  silodosin (RAPAFLO) 8 MG CAPS capsule Take 8 mg by mouth daily.    [provider]  tamsulosin (FLOMAX) 0.4 MG CAPS capsule Take 0.4 mg by mouth daily.    [provider]  telmisartan  (MICARDIS ) 40 MG tablet TAKE 1 TABLET BY MOUTH EVERY DAY 05/31/23   Camnitz, Soyla Lunger, MD  Varenicline Tartrate (TYRVAYA) 0.03 MG/ACT SOLN Place into the nose.    [provider]  Vibegron (GEMTESA) 75 MG TABS Take 75 mg by mouth daily.    [provider]  vitamin C  (ASCORBIC ACID ) 500 MG tablet Take 500 mg by mouth daily.    [provider]  VITAMIN D , CHOLECALCIFEROL , PO Take 1 capsule by mouth daily.    [provider]     Family History  Problem Relation Age of Onset   Coronary artery disease Brother    Heart disease Father    Lung cancer Father    Kidney cancer Father    Prostate cancer Father    Arthritis Mother    Lung cancer Mother    Dementia Mother        Unspecified type, not Alzheimer's disease    Social History   Socioeconomic History   Marital status: Married    Spouse name: Heron   Number of children: 2   Years of education: 18   Highest education level: Master's degree (e.g.,  MA, MS, MEng, MEd, MSW, MBA)  Occupational History   Occupation: Retired  Tobacco Use   Smoking status: Former    Current packs/day: 0.00    Average packs/day: 2.0 packs/day  for 10.0 years (20.0 ttl pk-yrs)    Types: Cigarettes    Start date: 07/17/1965    Quit date: 07/18/1975    Years since quitting: 48.6    Passive exposure: Past   Smokeless tobacco: Never  Vaping Use   Vaping status: Never Used  Substance and Sexual Activity   Alcohol  use: Not Currently    Comment: one - two drinks/week   Drug use: No   Sexual activity: Not Currently    Partners: Female    Comment: married  Other Topics Concern   Not on file  Social History Narrative   Right handed   Master degree   Drinks caffeine retired   Retired      Lives with wife and 2 cats/2025   Social Drivers of Health   Financial Resource Strain: Patient Declined (12/05/2023)   Overall Financial Resource Strain (CARDIA)    Difficulty of Paying Living Expenses: Patient declined  Food Insecurity: No Food Insecurity (12/05/2023)   Hunger Vital Sign    Worried About Running Out of Food in the Last Year: Never true    Ran Out of Food in the Last Year: Never true  Transportation Needs: No Transportation Needs (12/08/2023)   PRAPARE - Administrator, Civil Service (Medical): No    Lack of Transportation (Non-Medical): No  Physical Activity: Insufficiently Active (12/05/2023)   Exercise Vital Sign    Days of Exercise per Week: 1 day    Minutes of Exercise per Session: 10 min  Stress: No Stress Concern Present (12/05/2023)   [employer] of Occupational Health - Occupational Stress Questionnaire    Feeling of Stress: Only a little  Social Connections: Unknown (06/17/2023)   Social Connection and Isolation Panel    Frequency of Communication with Friends and Family: More than three times a week    Frequency of Social Gatherings with Friends and Family: Three times a week    Attends Religious Services: Patient declined     Active Member of Clubs or Organizations: Yes    Attends Banker Meetings: More than 4 times per year    Marital Status: Divorced       Review of Systems  Vital Signs:   Advance Care Plan: No documents on file  Physical Exam  Imaging: CUP PACEART REMOTE DEVICE CHECK Result Date: 02/29/2024 ILR summary report received. Battery status OK. Normal device function. No new symptom, tachy, brady, or pause episodes. 4 new AF episodes, longest 1 hour 12 minutes, EGMs consistent with AF, episode log and additional EGMs reviewed on website. Known history of AF, OAC contraindicated per Epic. Monthly summary reports and ROV/PRN - CS, CVRS   Labs:  CBC: Recent Labs    06/10/23 1015 06/21/23 1051 08/16/23 1205 02/17/24 1018  WBC 16.2* 11.2* 11.7* 21.6*  HGB 12.4* 13.2 12.2* 10.8*  HCT 38.8* 41.0 38.4* 33.5*  PLT 58* 63.0* 62* 49*    COAGS: Recent Labs    06/10/23 1015  INR 1.1    BMP: Recent Labs    06/10/23 1015 06/21/23 1051  NA 142 143  K 3.4* 4.8  CL 109 105  CO2 22 29  GLUCOSE 102* 103*  BUN 26* 19  CALCIUM  8.7* 9.5  CREATININE 1.36* 1.25  GFRNONAA 50*  --     LIVER FUNCTION TESTS: Recent Labs    06/21/23 1051  BILITOT 0.6  AST 19  ALT 16  ALKPHOS 105  PROT 6.4  ALBUMIN  4.5    TUMOR  MARKERS: No results for input(s): AFPTM, CEA, CA199, CHROMGRNA in the last 8760 hours.  Assessment and Plan: 89 y.o. male with past medical history of atrial fibrillation, BPH, chronic back pain, COPD, degenerative joint disease, Dupuytren's contracture, hypertension, fatty liver, fibromyalgia, anxiety, GERD, glaucoma, gout, peripheral neuropathy, hyperlipidemia, hypothyroidism, hearing loss, depression, obesity, sleep apnea, osteoarthritis, prior subdural hematoma, cerebrovascular disease ,prior TIA, diabetes, left facial abscess with prior I&D in March 2024 . He presents now with chronic progressive thrombocytopenia, anemia and increased  leukocytosis of uncertain etiology.  He is scheduled today for image guided bone marrow biopsy for further evaluation.Risks and benefits of procedure was discussed with the patient  including, but not limited to bleeding, infection, damage to adjacent structures or low yield requiring additional tests.  All of the questions were answered and there is agreement to proceed.  Consent signed and in chart.    Thank you for allowing our service to participate in Jonathan [name] 's care.  Electronically Signed: D. Franky Rakers, PA-C   03/06/2024, 12:15 PM      I spent a total of 20 minutes    in face to face in clinical consultation, greater than 50% of which was counseling/coordinating care for image guided bone marrow biopsy

## 2024-03-07 ENCOUNTER — Encounter (HOSPITAL_COMMUNITY): Payer: Self-pay

## 2024-03-07 ENCOUNTER — Other Ambulatory Visit: Payer: Self-pay

## 2024-03-07 ENCOUNTER — Ambulatory Visit (HOSPITAL_COMMUNITY)
Admission: RE | Admit: 2024-03-07 | Discharge: 2024-03-07 | Disposition: A | Discharge status: Home / Self Care | Source: Ambulatory Visit | Attending: Oncology | Admitting: Oncology

## 2024-03-07 DIAGNOSIS — D649 Anemia, unspecified: Secondary | ICD-10-CM | POA: Insufficient documentation

## 2024-03-07 DIAGNOSIS — Z87891 Personal history of nicotine dependence: Secondary | ICD-10-CM | POA: Insufficient documentation

## 2024-03-07 DIAGNOSIS — E039 Hypothyroidism, unspecified: Secondary | ICD-10-CM | POA: Diagnosis not present

## 2024-03-07 DIAGNOSIS — M797 Fibromyalgia: Secondary | ICD-10-CM | POA: Insufficient documentation

## 2024-03-07 DIAGNOSIS — D72829 Elevated white blood cell count, unspecified: Secondary | ICD-10-CM | POA: Insufficient documentation

## 2024-03-07 DIAGNOSIS — J449 Chronic obstructive pulmonary disease, unspecified: Secondary | ICD-10-CM | POA: Insufficient documentation

## 2024-03-07 DIAGNOSIS — D7589 Other specified diseases of blood and blood-forming organs: Secondary | ICD-10-CM | POA: Diagnosis not present

## 2024-03-07 DIAGNOSIS — E785 Hyperlipidemia, unspecified: Secondary | ICD-10-CM | POA: Diagnosis not present

## 2024-03-07 DIAGNOSIS — Z8673 Personal history of transient ischemic attack (TIA), and cerebral infarction without residual deficits: Secondary | ICD-10-CM | POA: Diagnosis not present

## 2024-03-07 DIAGNOSIS — Z66 Do not resuscitate: Secondary | ICD-10-CM | POA: Diagnosis not present

## 2024-03-07 DIAGNOSIS — Z79899 Other long term (current) drug therapy: Secondary | ICD-10-CM | POA: Insufficient documentation

## 2024-03-07 DIAGNOSIS — G8929 Other chronic pain: Secondary | ICD-10-CM | POA: Diagnosis not present

## 2024-03-07 DIAGNOSIS — M1000 Idiopathic gout, unspecified site: Secondary | ICD-10-CM | POA: Insufficient documentation

## 2024-03-07 DIAGNOSIS — E669 Obesity, unspecified: Secondary | ICD-10-CM | POA: Diagnosis not present

## 2024-03-07 DIAGNOSIS — D72821 Monocytosis (symptomatic): Secondary | ICD-10-CM | POA: Diagnosis not present

## 2024-03-07 DIAGNOSIS — K760 Fatty (change of) liver, not elsewhere classified: Secondary | ICD-10-CM | POA: Insufficient documentation

## 2024-03-07 DIAGNOSIS — D619 Aplastic anemia, unspecified: Secondary | ICD-10-CM | POA: Diagnosis not present

## 2024-03-07 DIAGNOSIS — Z8249 Family history of ischemic heart disease and other diseases of the circulatory system: Secondary | ICD-10-CM | POA: Insufficient documentation

## 2024-03-07 DIAGNOSIS — D696 Thrombocytopenia, unspecified: Secondary | ICD-10-CM | POA: Insufficient documentation

## 2024-03-07 DIAGNOSIS — G4730 Sleep apnea, unspecified: Secondary | ICD-10-CM | POA: Insufficient documentation

## 2024-03-07 DIAGNOSIS — E1142 Type 2 diabetes mellitus with diabetic polyneuropathy: Secondary | ICD-10-CM | POA: Diagnosis not present

## 2024-03-07 DIAGNOSIS — N400 Enlarged prostate without lower urinary tract symptoms: Secondary | ICD-10-CM | POA: Diagnosis not present

## 2024-03-07 DIAGNOSIS — I10 Essential (primary) hypertension: Secondary | ICD-10-CM | POA: Insufficient documentation

## 2024-03-07 DIAGNOSIS — Z1379 Encounter for other screening for genetic and chromosomal anomalies: Secondary | ICD-10-CM | POA: Insufficient documentation

## 2024-03-07 DIAGNOSIS — D6489 Other specified anemias: Secondary | ICD-10-CM | POA: Diagnosis not present

## 2024-03-07 DIAGNOSIS — H409 Unspecified glaucoma: Secondary | ICD-10-CM | POA: Insufficient documentation

## 2024-03-07 DIAGNOSIS — I4819 Other persistent atrial fibrillation: Secondary | ICD-10-CM | POA: Insufficient documentation

## 2024-03-07 LAB — CBC WITH DIFFERENTIAL/PLATELET
Abs Immature Granulocytes: 0.73 K/uL — ABNORMAL HIGH (ref 0.00–0.07)
Basophils Absolute: 0.1 K/uL (ref 0.0–0.1)
Basophils Relative: 1 %
Eosinophils Absolute: 0 K/uL (ref 0.0–0.5)
Eosinophils Relative: 0 %
HCT: 36.1 % — ABNORMAL LOW (ref 39.0–52.0)
Hemoglobin: 10.8 g/dL — ABNORMAL LOW (ref 13.0–17.0)
Immature Granulocytes: 6 %
Lymphocytes Relative: 14 %
Lymphs Abs: 1.6 K/uL (ref 0.7–4.0)
MCH: 28.7 pg (ref 26.0–34.0)
MCHC: 29.9 g/dL — ABNORMAL LOW (ref 30.0–36.0)
MCV: 96 fL (ref 80.0–100.0)
Monocytes Absolute: 4.2 K/uL — ABNORMAL HIGH (ref 0.1–1.0)
Monocytes Relative: 35 %
Neutro Abs: 5.4 K/uL (ref 1.7–7.7)
Neutrophils Relative %: 44 %
Platelets: 76 K/uL — ABNORMAL LOW (ref 150–400)
RBC: 3.76 MIL/uL — ABNORMAL LOW (ref 4.22–5.81)
RDW: 15.1 % (ref 11.5–15.5)
Smear Review: NORMAL
WBC: 12 K/uL — ABNORMAL HIGH (ref 4.0–10.5)
nRBC: 0 % (ref 0.0–0.2)

## 2024-03-07 LAB — GLUCOSE, CAPILLARY: Glucose-Capillary: 100 mg/dL — ABNORMAL HIGH (ref 70–99)

## 2024-03-07 MED ORDER — SODIUM CHLORIDE 0.9 % IV SOLN: INTRAVENOUS | Status: DC

## 2024-03-07 NOTE — Progress Notes (Signed)
 Arrived alert and oriented, moves all extremities x 4, strong plantar and dorsal flexion bilateral, dsg at rt lower back, band aid/ guaze noted to be CDI. Denies any pain or discomfort at this time

## 2024-03-07 NOTE — Procedures (Signed)
Interventional Radiology Procedure Note ? ?Procedure: CT BM ASP AND CORE BX   ? ?Complications: None ? ?Estimated Blood Loss:  MIN ? ?Findings: ?11 G ASP AND CORE   ? ?M. TREVOR Ramey Ketcherside, MD ? ? ? ?

## 2024-03-07 NOTE — Sedation Documentation (Signed)
Pt did not want sedation

## 2024-03-09 LAB — SURGICAL PATHOLOGY

## 2024-03-09 NOTE — Progress Notes (Signed)
 Remote Loop Recorder Transmission

## 2024-03-13 ENCOUNTER — Ambulatory Visit: Payer: PPO

## 2024-03-13 ENCOUNTER — Other Ambulatory Visit (HOSPITAL_COMMUNITY): Payer: Self-pay

## 2024-03-13 MED ORDER — HYDROMORPHONE HCL 4 MG PO TABS
4.0000 mg | ORAL_TABLET | ORAL | 0 refills | Status: DC
  Filled 2024-03-13: qty 28, 7d supply, fill #0

## 2024-03-14 ENCOUNTER — Inpatient Hospital Stay

## 2024-03-14 ENCOUNTER — Inpatient Hospital Stay: Admitting: Oncology

## 2024-03-14 VITALS — BP 134/55 | HR 58 | Temp 97.6°F | Resp 16 | Wt 170.2 lb

## 2024-03-14 DIAGNOSIS — E1140 Type 2 diabetes mellitus with diabetic neuropathy, unspecified: Secondary | ICD-10-CM | POA: Diagnosis not present

## 2024-03-14 DIAGNOSIS — D696 Thrombocytopenia, unspecified: Secondary | ICD-10-CM | POA: Diagnosis not present

## 2024-03-14 DIAGNOSIS — D649 Anemia, unspecified: Secondary | ICD-10-CM | POA: Diagnosis not present

## 2024-03-14 DIAGNOSIS — H409 Unspecified glaucoma: Secondary | ICD-10-CM | POA: Diagnosis not present

## 2024-03-14 DIAGNOSIS — E039 Hypothyroidism, unspecified: Secondary | ICD-10-CM | POA: Diagnosis not present

## 2024-03-14 DIAGNOSIS — M109 Gout, unspecified: Secondary | ICD-10-CM | POA: Diagnosis not present

## 2024-03-14 DIAGNOSIS — Z7952 Long term (current) use of systemic steroids: Secondary | ICD-10-CM | POA: Diagnosis not present

## 2024-03-14 DIAGNOSIS — F419 Anxiety disorder, unspecified: Secondary | ICD-10-CM | POA: Diagnosis not present

## 2024-03-14 DIAGNOSIS — K668 Other specified disorders of peritoneum: Secondary | ICD-10-CM | POA: Diagnosis not present

## 2024-03-14 DIAGNOSIS — G4730 Sleep apnea, unspecified: Secondary | ICD-10-CM | POA: Diagnosis not present

## 2024-03-14 DIAGNOSIS — I4891 Unspecified atrial fibrillation: Secondary | ICD-10-CM | POA: Diagnosis not present

## 2024-03-14 DIAGNOSIS — D72829 Elevated white blood cell count, unspecified: Secondary | ICD-10-CM | POA: Diagnosis not present

## 2024-03-14 LAB — CBC WITH DIFFERENTIAL (CANCER CENTER ONLY)
Abs Immature Granulocytes: 0.3 K/uL — ABNORMAL HIGH (ref 0.00–0.07)
Band Neutrophils: 5 %
Basophils Absolute: 0 K/uL (ref 0.0–0.1)
Basophils Relative: 0 %
Eosinophils Absolute: 0 K/uL (ref 0.0–0.5)
Eosinophils Relative: 0 %
HCT: 35.9 % — ABNORMAL LOW (ref 39.0–52.0)
Hemoglobin: 11.5 g/dL — ABNORMAL LOW (ref 13.0–17.0)
Lymphocytes Relative: 21 %
Lymphs Abs: 2.8 K/uL (ref 0.7–4.0)
MCH: 30.5 pg (ref 26.0–34.0)
MCHC: 32 g/dL (ref 30.0–36.0)
MCV: 95.2 fL (ref 80.0–100.0)
Metamyelocytes Relative: 2 %
Monocytes Absolute: 3.5 K/uL — ABNORMAL HIGH (ref 0.1–1.0)
Monocytes Relative: 26 %
Neutro Abs: 6.8 K/uL (ref 1.7–7.7)
Neutrophils Relative %: 46 %
Platelet Count: 71 K/uL — ABNORMAL LOW (ref 150–400)
RBC: 3.77 MIL/uL — ABNORMAL LOW (ref 4.22–5.81)
RDW: 15.5 % (ref 11.5–15.5)
Smear Review: NORMAL
WBC Count: 13.4 K/uL — ABNORMAL HIGH (ref 4.0–10.5)
nRBC: 0 % (ref 0.0–0.2)

## 2024-03-14 LAB — CMP (CANCER CENTER ONLY)
ALT: 37 U/L (ref 0–44)
AST: 30 U/L (ref 15–41)
Albumin: 4.1 g/dL (ref 3.5–5.0)
Alkaline Phosphatase: 95 U/L (ref 38–126)
Anion gap: 12 (ref 5–15)
BUN: 22 mg/dL (ref 8–23)
CO2: 24 mmol/L (ref 22–32)
Calcium: 9.3 mg/dL (ref 8.9–10.3)
Chloride: 107 mmol/L (ref 98–111)
Creatinine: 1.5 mg/dL — ABNORMAL HIGH (ref 0.61–1.24)
GFR, Estimated: 45 mL/min — ABNORMAL LOW (ref >60)
Glucose, Bld: 144 mg/dL — ABNORMAL HIGH (ref 70–99)
Potassium: 3.6 mmol/L (ref 3.5–5.1)
Sodium: 143 mmol/L (ref 135–145)
Total Bilirubin: 0.5 mg/dL (ref 0.0–1.2)
Total Protein: 5.9 g/dL — ABNORMAL LOW (ref 6.5–8.1)

## 2024-03-14 NOTE — Progress Notes (Signed)
  [facility] OFFICE PROGRESS NOTE   Diagnosis: Thrombocytopenia, anemia, leukocytosis  INTERVAL HISTORY:   Jonathan [name] returns as scheduled.  He has chronic back pain.  He reports a poor appetite.  He underwent a bone marrow biopsy 03/07/2024.  He reports tolerating the procedure well.  He is here today with Jonathan [name].  He plans to have a tooth extraction.  Objective:  Vital signs in last 24 hours:  Blood pressure (!) 134/55, pulse (!) 58, temperature 97.6 F (36.4 C), temperature source Temporal, resp. rate 16, weight 170 lb 3.2 oz (77.2 kg), SpO2 97%.   Lymphatics: No cervical, supraclavicular, axillary, or inguinal nodes Resp: Lungs clear bilaterally Cardio: Regular rate and rhythm GI: No hepatosplenomegaly Vascular: No leg edema  Skin: Iliac bone marrow biopsy site with a resolving ecchymosis, no evidence of infection   Lab Results:  Lab Results  Component Value Date   WBC 13.4 (H) 03/14/2024   HGB 11.5 (L) 03/14/2024   HCT 35.9 (L) 03/14/2024   MCV 95.2 03/14/2024   PLT 71 (L) 03/14/2024   NEUTROABS 6.8 03/14/2024    CMP  Lab Results  Component Value Date   NA 143 03/14/2024   K 3.6 03/14/2024   CL 107 03/14/2024   CO2 24 03/14/2024   GLUCOSE 144 (H) 03/14/2024   BUN 22 03/14/2024   CREATININE 1.50 (H) 03/14/2024   CALCIUM  9.3 03/14/2024   PROT 5.9 (L) 03/14/2024   ALBUMIN  4.1 03/14/2024   AST 30 03/14/2024   ALT 37 03/14/2024   ALKPHOS 95 03/14/2024   BILITOT 0.5 03/14/2024   GFRNONAA 45 (L) 03/14/2024   GFRAA >60 03/14/2020    No results found for: CEA1, CEA, CAN199, CA125  Lab Results  Component Value Date   INR 1.1 06/10/2023   LABPROT 13.9 06/10/2023    Imaging:  No results found.  Medications: I have reviewed the patient's current medications.   Assessment/Plan:  Thrombocytopenia-chronic 03/07/2024 bone marrow biopsy: Hypercellular marrow with myeloid hyperplasia, megakaryocytic hyperplasia with dyspoiesis, and  erythroid hypoplasia decreased iron stores, no ring sideroblasts, no increase in blasts flow cytometry-no monoclonal B-cell or abnormal T-cell population   Atrial fibrillation-apixaban  discontinued May 2023 due to recurrent falls/syncope Hypertension Fibromyalgia Diabetes Hypothyroidism Anxiety Gout Neuropathy Sleep apnea-CPAP Glaucoma Mild elevation of monocyte count Small cutaneous cyst at the perineum Left facial abscess, status post I&D March 2024 Mild normocytic anemia-chronic     Disposition: Jonathan [name] has chronic anemia/thrombocytopenia and an increase in the monocyte count.  A bone marrow biopsy 03/07/2024 revealed a hypercellular marrow.  Cytogenetics are pending.  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  We will follow-up on the routine cytogenetics and myeloid FISH panel.  The plan is to follow him with observation.  He will return for an office and lab visit in 3-4 months.  Jonathan [name] plans to have a tooth extracted.  I think it is reasonable to proceed with a tooth extraction with the platelet count at the current level, but the final decision on the need for platelet transfusion support will be made by Jonathan dentist.  Jonathan Hof, MD  03/14/2024  12:37 PM

## 2024-03-15 ENCOUNTER — Encounter (HOSPITAL_COMMUNITY): Payer: Self-pay | Admitting: Oncology

## 2024-03-15 DIAGNOSIS — J449 Chronic obstructive pulmonary disease, unspecified: Secondary | ICD-10-CM | POA: Diagnosis not present

## 2024-03-17 ENCOUNTER — Encounter: Payer: Self-pay | Admitting: Pharmacist

## 2024-03-22 ENCOUNTER — Other Ambulatory Visit (HOSPITAL_COMMUNITY): Payer: Self-pay

## 2024-03-22 ENCOUNTER — Encounter: Payer: Self-pay | Admitting: *Deleted

## 2024-03-22 DIAGNOSIS — M5136 Other intervertebral disc degeneration, lumbar region with discogenic back pain only: Secondary | ICD-10-CM | POA: Diagnosis not present

## 2024-03-22 DIAGNOSIS — M546 Pain in thoracic spine: Secondary | ICD-10-CM | POA: Diagnosis not present

## 2024-03-22 DIAGNOSIS — M5416 Radiculopathy, lumbar region: Secondary | ICD-10-CM | POA: Diagnosis not present

## 2024-03-22 DIAGNOSIS — G8929 Other chronic pain: Secondary | ICD-10-CM | POA: Diagnosis not present

## 2024-03-22 MED ORDER — HYDROMORPHONE HCL 4 MG PO TABS
4.0000 mg | ORAL_TABLET | Freq: Four times a day (QID) | ORAL | 0 refills | Status: AC
  Filled 2024-03-22: qty 28, 7d supply, fill #0

## 2024-03-22 NOTE — Progress Notes (Signed)
 Sent email to [facility] requesting MDS FISH panel on case #[id number] dated 03/07/24 per Dr. Cloretta request.

## 2024-03-30 ENCOUNTER — Encounter (HOSPITAL_COMMUNITY): Payer: Self-pay | Admitting: Oncology

## 2024-03-30 ENCOUNTER — Ambulatory Visit

## 2024-03-30 DIAGNOSIS — R55 Syncope and collapse: Secondary | ICD-10-CM | POA: Diagnosis not present

## 2024-03-30 LAB — CUP PACEART REMOTE DEVICE CHECK
Date Time Interrogation Session: 20251015232259
Implantable Pulse Generator Implant Date: 20230612

## 2024-04-01 ENCOUNTER — Ambulatory Visit: Payer: Self-pay | Admitting: Oncology

## 2024-04-03 NOTE — Telephone Encounter (Signed)
 Patient gave verbal understanding and had no further questions.

## 2024-04-03 NOTE — Telephone Encounter (Signed)
-----   Message from Arley Hof sent at 04/01/2024  8:20 AM EDT ----- Please call patient, the myelodysplasia FISH panel is negative, plan to continue observation, f/u as scheduled  ----- Message ----- From: Burnetta Madelin GRADE Sent: 03/30/2024  12:36 PM EDT To: Arley KATHEE Hof, MD

## 2024-04-05 NOTE — Progress Notes (Signed)
 Remote Loop Recorder Transmission

## 2024-04-07 ENCOUNTER — Ambulatory Visit: Payer: Self-pay | Admitting: Cardiology

## 2024-04-10 DIAGNOSIS — Z1283 Encounter for screening for malignant neoplasm of skin: Secondary | ICD-10-CM | POA: Diagnosis not present

## 2024-04-10 DIAGNOSIS — D225 Melanocytic nevi of trunk: Secondary | ICD-10-CM | POA: Diagnosis not present

## 2024-04-12 ENCOUNTER — Telehealth: Payer: Self-pay

## 2024-04-12 NOTE — Telephone Encounter (Signed)
 Received a refill reorder form [employer] , filled and faxed to provider office to sign and date. Can be faxed to [employer] or fax back to [phone]

## 2024-04-14 NOTE — Telephone Encounter (Signed)
 Received provider portion [employer] faxed to [employer] today.

## 2024-04-15 DIAGNOSIS — J449 Chronic obstructive pulmonary disease, unspecified: Secondary | ICD-10-CM | POA: Diagnosis not present

## 2024-04-17 ENCOUNTER — Encounter: Payer: Self-pay | Admitting: Internal Medicine

## 2024-04-17 ENCOUNTER — Telehealth: Payer: Self-pay

## 2024-04-17 NOTE — Telephone Encounter (Signed)
 Gave pt a call pt is coming up due for re-enrollment on [employer] Farxiga  and [employer],spoke with pt explain he will be giving [employer] a call to give consent to 2026 enrollment and if in any case he needs the application he will call back,for [employer] (Rubelsus)explain to pt company will no longer honor this [employer] explain he received a letter from [employer], he is not sure if he will keep same Ins or switch,pt knows to call provider for a new rx to be sent to the pharmacy.

## 2024-04-18 MED ORDER — DAPAGLIFLOZIN PROPANEDIOL 10 MG PO TABS: 10.0000 mg | ORAL_TABLET | Freq: Every day | ORAL | 3 refills | Status: AC

## 2024-04-18 NOTE — Addendum Note (Signed)
 Addended by: MERCEDA LELA SAUNDERS on: 04/18/2024 09:56 AM   Modules accepted: Orders

## 2024-04-22 ENCOUNTER — Other Ambulatory Visit (HOSPITAL_COMMUNITY): Payer: Self-pay

## 2024-04-22 MED ORDER — HYDROMORPHONE HCL 4 MG PO TABS
4.0000 mg | ORAL_TABLET | Freq: Four times a day (QID) | ORAL | 0 refills | Status: DC | PRN
  Filled 2024-04-22: qty 28, 7d supply, fill #0

## 2024-04-25 DIAGNOSIS — R350 Frequency of micturition: Secondary | ICD-10-CM | POA: Diagnosis not present

## 2024-04-25 DIAGNOSIS — R351 Nocturia: Secondary | ICD-10-CM | POA: Diagnosis not present

## 2024-04-25 DIAGNOSIS — N3281 Overactive bladder: Secondary | ICD-10-CM | POA: Diagnosis not present

## 2024-04-27 DIAGNOSIS — M47814 Spondylosis without myelopathy or radiculopathy, thoracic region: Secondary | ICD-10-CM | POA: Diagnosis not present

## 2024-04-30 ENCOUNTER — Ambulatory Visit

## 2024-05-01 ENCOUNTER — Ambulatory Visit

## 2024-05-03 ENCOUNTER — Ambulatory Visit: Attending: Cardiology

## 2024-05-03 DIAGNOSIS — I480 Paroxysmal atrial fibrillation: Secondary | ICD-10-CM | POA: Diagnosis not present

## 2024-05-04 ENCOUNTER — Ambulatory Visit: Payer: Self-pay | Admitting: Cardiology

## 2024-05-04 LAB — CUP PACEART REMOTE DEVICE CHECK
Date Time Interrogation Session: 20251118233352
Implantable Pulse Generator Implant Date: 20230612

## 2024-05-05 ENCOUNTER — Encounter: Payer: Self-pay | Admitting: Internal Medicine

## 2024-05-05 NOTE — Progress Notes (Signed)
 Remote Loop Recorder Transmission

## 2024-05-15 DIAGNOSIS — J449 Chronic obstructive pulmonary disease, unspecified: Secondary | ICD-10-CM | POA: Diagnosis not present

## 2024-05-16 ENCOUNTER — Ambulatory Visit: Admitting: Adult Health

## 2024-05-16 ENCOUNTER — Encounter: Payer: Self-pay | Admitting: Adult Health

## 2024-05-16 VITALS — BP 134/54 | HR 58 | Temp 98.7°F | Ht 66.0 in | Wt 170.4 lb

## 2024-05-16 DIAGNOSIS — G4733 Obstructive sleep apnea (adult) (pediatric): Secondary | ICD-10-CM | POA: Diagnosis not present

## 2024-05-16 DIAGNOSIS — F5104 Psychophysiologic insomnia: Secondary | ICD-10-CM | POA: Diagnosis not present

## 2024-05-16 DIAGNOSIS — Z87891 Personal history of nicotine dependence: Secondary | ICD-10-CM

## 2024-05-16 DIAGNOSIS — G4700 Insomnia, unspecified: Secondary | ICD-10-CM

## 2024-05-16 NOTE — Patient Instructions (Addendum)
 Continue on CPAP At bedtime, wear all night long  Work on healthy weight loss  Healthy sleep regimen  Activity as tolerated  Order for new CPAP  Follow up in 3 months and As needed

## 2024-05-16 NOTE — Progress Notes (Signed)
 @Patient  ID: Jonathan [name], male    DOB: November 23, 1932, 89 y.o.   MRN: [id number]  Chief Complaint  Patient presents with   Consult    sleep    Referring provider: Geofm Glade PARAS, MD  HPI: 89 year old male seen for sleep consult May 16, 2024 to establish for sleep apnea Diagnosed with sleep apnea in 2013 started on CPAP therapy DME company [employer] Former patient of Dr. Shelah followed for COPD.  Last seen 2020 Medical history significant for A-fib, thrombocytopenia followed by hematology, neuropathy, dementia   TEST/EVENTS : Reviewed 05/16/2024   05/16/2024 Sleep consult  Patient presents for sleep consult.  Patient's had longstanding sleep apnea.  Has been on CPAP since 2013.  Sleep study October 04, 2011 showed moderate sleep apnea with AHI 26/hour.  Patient says he wears a CPAP every single night.  He is here today to establish for sleep apnea and get new CPAP machine.  Double he goes to bed about 10:30 PM.  Takes anywhere from an hour to 3 hours to go to sleep.  Is up several times throughout the night.  Gets up anywhere from 8 to 10:30 AM.  Complains of chronic insomnia and restless sleep.  Current weight is 170 pounds with a BMI 27.  Patient has 2 cups of coffee daily in the morning.  He has a history of with A-fib.  Followed by cardiology.  He uses a fullface mask.  DME company is [employer].  Has no suspicious symptoms for cataplexy or sleep paralysis.  Patient says he cannot sleep without a CPAP.  Feels that he benefits from CPAP.  CPAP download shows 100% compliance.  Daily average usage at 7.5 hours.  He is on auto CPAP 5 to 20 cm H2O.  Daily average pressure at 9.9 cm H2O.  AHI is 0.7/hour.  Patient feels that he benefits from CPAP. Patient complains that he has trouble sleeping a lot of times getting to sleep and staying asleep.  Feels that he wakes up will read for little bit then goes back to sleep but wakes up several times about the night and this pattern.  Stays in the bed later  if he does not get much sleep in the morning.  He does not currently use any sleep aids.  Patient has chronic pain and neuropathy.  Is on chronic pain medication.   Previously seen in the office for COPD.  Patient says he stopped his inhalers years ago.  He said he really did not see a whole lot of benefit and they were too expensive.  He denies any current cough or wheezing.  Patient says he is quite sedentary due to his pain in his legs and feet.  From neuropathy.  Is not very active.  Past Surgical History:  Procedure Laterality Date   APPENDECTOMY  age 13   CARPAL TUNNEL RELEASE Right    early 2000s   CATARACT EXTRACTION     bilateral   CHOLECYSTECTOMY  age 33   EYE SURGERY     FOOT ARTHRODESIS Right 02/02/2013   Procedure: RIGHT HALLUX METATARSAL PHALANGEAL JOINT ARTHRODESIS ;  Surgeon: Norleen Armor, MD;  Location: [facility];  Service: Orthopedics;  Laterality: Right;   INCISION AND DRAINAGE ABSCESS Left 09/03/2022   Procedure: INCISION AND DRAINAGE LEFT FACIAL  ABSCESS;  Surgeon: Luciano Standing, MD;  Location: [facility] OR;  Service: ENT;  Laterality: Left;   INCISION AND DRAINAGE ABSCESS Left 09/07/2022   Procedure: IRRIGATION AND DEBRIDMENT OF LEFT  FACIAL ABSCESS;  Surgeon: Luciano Standing, MD;  Location: [facility] OR;  Service: ENT;  Laterality: Left;   INGUINAL HERNIA REPAIR  age 22   rt side   IR ANGIO INTRA EXTRACRAN SEL COM CAROTID INNOMINATE BILAT MOD SED  06/10/2023   IR ANGIO VERTEBRAL SEL SUBCLAVIAN INNOMINATE UNI L MOD SED  06/10/2023   IR ANGIO VERTEBRAL SEL VERTEBRAL UNI R MOD SED  06/10/2023   IR RADIOLOGIST EVAL & MGMT  06/02/2023   NASAL CONCHA BULLOSA RESECTION  age 15   PROSTATE SURGERY     SHOULDER ARTHROSCOPY W/ ROTATOR CUFF REPAIR Right    early 2000s   tonsil     VASECTOMY  age 60       Allergies  Allergen Reactions   Dust Mite Extract Shortness Of Breath   Other Other (See Comments)    DUST- Respiratory distress    Immunization History   Administered Date(s) Administered   Fluad Quad(high Dose 65+) 03/25/2020, 04/07/2021, 03/28/2022, 03/10/2023   Hepatitis A 07/25/2007, 01/22/2008   Hepatitis B 04/30/1988, 06/02/1988, 10/28/1988   INFLUENZA, HIGH DOSE SEASONAL PF 03/10/2017, 02/15/2018, 02/09/2019, 03/17/2024   IPV 05/15/1996   Influenza Split 02/19/2012, 03/13/2016   Influenza Whole 02/17/2011, 03/13/2013   Influenza,inj,Quad PF,6+ Mos 03/15/2015   Influenza,inj,quad, With Preservative 02/14/2019   Meningococcal polysaccharide vaccine (MPSV4) 05/15/1996   Moderna Covid-19 Fall Seasonal Vaccine 41yrs & older 12/16/2022   Moderna Covid-19 Vaccine  Bivalent Booster 67yrs & up 02/25/2021, 10/06/2021   Moderna Sars-Covid-2 Vaccination 07/15/2019, 08/12/2019, 02/15/2020, 07/25/2020   PNEUMOCOCCAL CONJUGATE-20 07/09/2021   Pfizer(Comirnaty)Fall Seasonal Vaccine 12 years and older 03/09/2022   Pneumococcal Conjugate-13 12/08/2016   Pneumococcal-Unspecified 02/01/2006   Respiratory Syncytial Virus Vaccine,Recomb Aduvanted(Arexvy) 04/25/2022   Td 01/14/1995   Tdap 03/06/2018   Zoster Recombinant(Shingrix) 01/15/2017, 01/04/2018   Zoster, Live 02/01/2006    Past Medical History:  Diagnosis Date   Abdominal aortic atherosclerosis 02/11/2022   Acute encephalopathy 05/08/2019   Allergic rhinitis, seasonal 12/04/2011   Aortic atherosclerosis 02/04/2021   Arthritis of finger of both hands 03/27/2021   Atrial fibrillation (HCC)    BPH (benign prostatic hyperplasia)    Cataract    Chronic anticoagulation 12/21/2017   CHADS VASC=4, Eliquis  stopped Sept 2021- recurrent falls with SDH   Chronic pain, legs and back 03/10/2017   Preferred pain management Leg and back pain   Compression fracture of thoracic vertebra 09/21/2017   COPD (chronic obstructive pulmonary disease)    2 liters O2 HS   Degeneration of thoracic intervertebral disc 09/21/2017   Degenerative joint disease of hand 07/14/2017   Dupuytren's contracture  07/12/2019   Essential hypertension 12/04/2011   Fatty liver 12/29/2020   Fatty tumor    waste and back   Fibromyalgia    Generalized anxiety disorder 06/12/2020   12/21 He is reporting severe insomnia and anxiety symptoms.  Options are limited.  We will try a low-dose lorazepam  at night and during the day for anxiety and panic attacks.  He will stop lorazepam  if problems.  He will stop drinking alcohol .  Discontinue BuSpar .  Reduce Cymbalta  to 1 a day.   GERD (gastroesophageal reflux disease)    Glaucoma 03/08/2016   Gout 03/08/2016   Hereditary and idiopathic peripheral neuropathy 08/28/2015   History of COVID-19 05/08/2019   2021 Post-COVID sx's - brain fog Try Lion's mane supplement and B complex with niacin for neuropathy   Hyperlipidemia 03/10/2017   Hypothyroidism    Hypoxia    Insomnia    Left-sided  sensorineural hearing loss 07/12/2018   Lumbar spondylosis 02/11/2022   Major depressive disorder 09/07/2016   Mild cognitive impairment of uncertain or unknown etiology 12/27/2020   Obesity (BMI 30.0-34.9) 12/08/2016   Obstructive sleep apnea    Osteoarthritis    Osteoporosis    Pain in joint of right ankle 09/15/2021   Persistent atrial fibrillation    on Eliquis    Recurrent falls 03/25/2020   PT. Treat neuropathy, insomnia.  Reduce Cymbalta  to 1 a day.  Discontinue BuSpar .   Rib pain on left side 07/25/2021   Rotator cuff arthropathy of left shoulder 07/21/2019   SDH (subdural hematoma) 03/21/2020   Recurrent SDH secondary to falls at home- Sept 2019 and again 03/14/2020- Eliquis  stopped.   Sebaceous cyst 06/27/2019   Spinal compression fracture seventh vertebre   Spondylolisthesis 09/07/2016   On fosamax  - for about one year - Dr Sherrilee  10/14/17 dexa: normal dexa -- spine 2.1,   RFN -0.9,  LFN   -0.7   - no comparison on file, previous fracture      Thoracic back pain 08/10/2017   Thrombocytopenia 12/20/2020   TIA (transient ischemic attack) 08/28/2015   Type 2  diabetes mellitus 03/08/2016    Tobacco History: Social History   Tobacco Use  Smoking Status Former   Current packs/day: 0.00   Average packs/day: 2.0 packs/day for 10.0 years (20.0 ttl pk-yrs)   Types: Cigarettes   Start date: 07/17/1965   Quit date: 07/18/1975   Years since quitting: 48.8   Passive exposure: Past  Smokeless Tobacco Never   Counseling given: Not Answered   Outpatient Medications Prior to Visit  Medication Sig Dispense Refill   allopurinol  (ZYLOPRIM ) 300 MG tablet TAKE 1 TABLET BY MOUTH IN THE EVENING FOR GOUT 90 tablet 3   amiodarone  (PACERONE ) 200 MG tablet TAKE 1 TABLET BY MOUTH EVERY DAY 90 tablet 3   amLODipine  (NORVASC ) 5 MG tablet TAKE 1 TABLET (5 MG TOTAL) BY MOUTH DAILY. 90 tablet 3   atorvastatin  (LIPITOR) 20 MG tablet TAKE 1 TABLET BY MOUTH EVERY DAY FOR CHOLESTEROL 90 tablet 1   Calcium  Citrate-Vitamin D  (CALCIUM  + D PO) Take 1 tablet by mouth daily.     carboxymethylcellulose (REFRESH PLUS) 0.5 % SOLN Place 1 drop into both eyes 3 (three) times daily as needed (dry eyes).     dapagliflozin  propanediol (FARXIGA ) 10 MG TABS tablet Take 1 tablet (10 mg total) by mouth daily. 90 tablet 3   docusate sodium  (COLACE) 100 MG capsule Take 1-3 capsules (100-300 mg total) by mouth daily as needed for mild constipation. 90 capsule 5   FLUoxetine  (PROZAC ) 40 MG capsule Take 1 capsule (40 mg total) by mouth daily. 90 capsule 1   Fluticasone  Propionate (ALLERGY RELIEF NA) Place 2 sprays into the nose daily as needed (allergies).     hydrochlorothiazide  (HYDRODIURIL ) 12.5 MG tablet TAKE 1 TABLET BY MOUTH EVERY DAY 90 tablet 1   HYDROmorphone  (DILAUDID ) 4 MG tablet Take 1 tablet (4 mg total) by mouth 4 (four) times daily as needed (Patient taking differently: Take 4 mg by mouth every 6 (six) hours as needed.) 28 tablet 0   Lancets MISC Use to check blood sugars daily. E11.9 100 each 3   latanoprost  (XALATAN ) 0.005 % ophthalmic solution Place 1 drop into both eyes at  bedtime.     levothyroxine  (SYNTHROID ) 175 MCG tablet TAKE 1 TABLET BY MOUTH DAILY. TAKE 30 MINUTES PRIOR TO BREAKFAST. 90 tablet 1   Melatonin 5  MG TABS Take 5 mg by mouth at bedtime.     memantine  (NAMENDA ) 5 MG tablet TAKE 1 TABLET (5 MG AT NIGHT) BY MOUTH FOR 2 WEEKS, THEN INCREASE TO 1 TABLET (5 MG) TWICE A DAY 180 tablet 2   mirabegron  ER (MYRBETRIQ ) 25 MG TB24 tablet Take 25 mg by mouth daily.     Multiple Vitamin (MULTIVITAMIN) tablet Take 1 tablet by mouth daily.     nitroGLYCERIN  (NITROSTAT ) 0.4 MG SL tablet Place 1 tablet (0.4 mg total) under the tongue every 5 (five) minutes as needed for chest pain. 25 tablet 10   omeprazole  (PRILOSEC) 20 MG capsule TAKE 1 CAPSULE BY MOUTH EVERY DAY 90 capsule 1   oxyCODONE -acetaminophen  (PERCOCET) 10-325 MG tablet Take 1 tablet by mouth every 8 (eight) hours as needed for pain.     Probiotic Product (PROBIOTIC PO) Take 1 capsule by mouth daily.     Semaglutide  (RYBELSUS ) 7 MG TABS Take 7 mg by mouth daily. Via [employer] pt assistance 30 tablet 3   sildenafil (REVATIO) 20 MG tablet Take 40-100 mg by mouth daily as needed.     silodosin (RAPAFLO) 8 MG CAPS capsule Take 8 mg by mouth daily.     tamsulosin (FLOMAX) 0.4 MG CAPS capsule Take 0.4 mg by mouth daily.     telmisartan  (MICARDIS ) 40 MG tablet TAKE 1 TABLET BY MOUTH EVERY DAY 90 tablet 2   Varenicline Tartrate (TYRVAYA) 0.03 MG/ACT SOLN Place into the nose.     Vibegron (GEMTESA) 75 MG TABS Take 75 mg by mouth daily.     vitamin C  (ASCORBIC ACID ) 500 MG tablet Take 500 mg by mouth daily. (Patient taking differently: Take 500 mg by mouth 2 (two) times daily.)     VITAMIN D , CHOLECALCIFEROL , PO Take 1 capsule by mouth daily.     Emollient (RA RENEWAL DARK SPOT CORRECTOR EX) Apply 1 application topically daily as needed (dark spots). (Patient not taking: Reported on 05/16/2024)     furosemide  (LASIX ) 40 MG tablet Take 40 mg by mouth daily. (Patient not taking: Reported on 05/16/2024)      Lido-Menthol-Methyl Sal-Camph (CBD KINGS EX) Apply 1 Application topically as needed (pain). (Patient not taking: Reported on 05/16/2024)     No facility-administered medications prior to visit.     Review of Systems:   Constitutional:   No  weight loss, night sweats,  Fevers, chills,+ fatigue, or  lassitude.  HEENT:   No headaches,  Difficulty swallowing,  Tooth/dental problems, or  Sore throat,                No sneezing, itching, ear ache, nasal congestion, post nasal drip,   CV:  No chest pain,  Orthopnea, PND, swelling in lower extremities, anasarca, dizziness, palpitations, syncope.   GI  No heartburn, indigestion, abdominal pain, nausea, vomiting, diarrhea, change in bowel habits, loss of appetite, bloody stools.   Resp: No shortness of breath with exertion or at rest.  No excess mucus, no productive cough,  No non-productive cough,  No coughing up of blood.  No change in color of mucus.  No wheezing.  No chest wall deformity  Skin: no rash or lesions.  GU: no dysuria, change in color of urine, no urgency or frequency.  No flank pain, no hematuria   MS:  + neuropathy     Physical Exam  BP (!) 134/54   Pulse (!) 58   Temp 98.7 F (37.1 C)   Ht 5' 6 (  1.676 m) Comment: Per pt  Wt 170 lb 6.4 oz (77.3 kg)   SpO2 95% Comment: RA  BMI 27.50 kg/m   GEN: A/Ox3; pleasant , NAD, well nourished    HEENT:  [place]/AT,  NOSE-clear, THROAT-clear, no lesions, no postnasal drip or exudate noted. Class 2-3 MP airway   NECK:  Supple w/ fair ROM; no JVD; normal carotid impulses w/o bruits; no thyromegaly or nodules palpated; no lymphadenopathy.    RESP  Clear  P & A; w/o, wheezes/ rales/ or rhonchi. no accessory muscle use, no dullness to percussion  CARD:  RRR, no m/r/g, no peripheral edema, pulses intact, no cyanosis or clubbing.  GI:   Soft & nt; nml bowel sounds; no organomegaly or masses detected.   Musco: Warm bil, no deformities or joint swelling noted.   Neuro: alert, no  focal deficits noted.    Skin: Warm, no lesions or rashes    Lab Results:Reviewed 05/16/2024   CBC    BNP    Imaging: CUP PACEART REMOTE DEVICE CHECK Result Date: 05/04/2024 ILR summary report received. Battery status OK. Normal device function. No new symptom, tachy, brady, or pause episodes. 7 new AF episodes >/= to 10 min.  OAC is contraindicated.  Longest 56 min. Monthly summary reports and ROV/PRN ML, CVRS   Administration History     None           No data to display          No results found for: NITRICOXIDE     05/16/2024    1:00 PM  Results of the Epworth flowsheet  Sitting and reading 3  Watching TV 2  Sitting, inactive in a public place (e.g. a theatre or a meeting) 0  As a passenger in a car for an hour without a break 0  Lying down to rest in the afternoon when circumstances permit 3  Sitting and talking to someone 0  Sitting quietly after a lunch without alcohol  2  In a car, while stopped for a few minutes in traffic 0  Total score 10        Assessment & Plan:   Assessment and Plan  Moderate obstructive sleep apnea-diagnosed in 2013 with sleep study showing an AHI at 26/hour.  Patient has been on CPAP with excellent compliance and control.  He has perceived benefit.  Needs a order for new CPAP machine order sent to [employer] [facility] to continue on CPAP will continue on auto CPAP 5 to 15 cm H2O. Patient education on sleep apnea and CPAP care - discussed how weight can impact sleep and risk for sleep disordered breathing - discussed options to assist with weight loss: combination of diet modification, cardiovascular and strength training exercises   - had an extensive discussion regarding the adverse health consequences related to untreated sleep disordered breathing - specifically discussed the risks for hypertension, coronary artery disease, cardiac dysrhythmias, cerebrovascular disease, and diabetes - lifestyle modification discussed   -  discussed how sleep disruption can increase risk of accidents, particularly when driving - safe driving practices were discussed   Chronic insomnia.  Healthy sleep regimen discussed in detail.  Patient has difficulty staying asleep and going to sleep.  Healthy sleep regimen discussed in detail. Avoid sleep aids.  May try some over-the-counter supplements such as magnesium  if not contraindicated with his current medication regimen.   BMI 27.  Encouraged on healthy weight loss  Plan  Patient Instructions  Continue on CPAP At bedtime, wear all  night long  Work on healthy weight loss  Healthy sleep regimen  Activity as tolerated  Order for new CPAP  Follow up in 3 months and As needed           Madelin Stank, NP 05/16/2024

## 2024-05-18 ENCOUNTER — Encounter: Payer: Self-pay | Admitting: Internal Medicine

## 2024-05-22 ENCOUNTER — Other Ambulatory Visit: Payer: Self-pay

## 2024-05-23 DIAGNOSIS — M47816 Spondylosis without myelopathy or radiculopathy, lumbar region: Secondary | ICD-10-CM | POA: Diagnosis not present

## 2024-05-23 DIAGNOSIS — M5416 Radiculopathy, lumbar region: Secondary | ICD-10-CM | POA: Diagnosis not present

## 2024-05-23 DIAGNOSIS — Z5181 Encounter for therapeutic drug level monitoring: Secondary | ICD-10-CM | POA: Diagnosis not present

## 2024-05-23 DIAGNOSIS — M47896 Other spondylosis, lumbar region: Secondary | ICD-10-CM | POA: Diagnosis not present

## 2024-05-25 DIAGNOSIS — G609 Hereditary and idiopathic neuropathy, unspecified: Secondary | ICD-10-CM | POA: Diagnosis not present

## 2024-05-25 DIAGNOSIS — M47814 Spondylosis without myelopathy or radiculopathy, thoracic region: Secondary | ICD-10-CM | POA: Diagnosis not present

## 2024-05-25 DIAGNOSIS — M5134 Other intervertebral disc degeneration, thoracic region: Secondary | ICD-10-CM | POA: Diagnosis not present

## 2024-05-31 ENCOUNTER — Ambulatory Visit

## 2024-06-03 ENCOUNTER — Ambulatory Visit

## 2024-06-03 DIAGNOSIS — I480 Paroxysmal atrial fibrillation: Secondary | ICD-10-CM

## 2024-06-04 LAB — CUP PACEART REMOTE DEVICE CHECK
Date Time Interrogation Session: 20251219232323
Implantable Pulse Generator Implant Date: 20230612

## 2024-06-05 NOTE — Progress Notes (Signed)
 Remote Loop Recorder Transmission

## 2024-06-06 NOTE — Telephone Encounter (Signed)
 Spoke with pt today explain he call AZ[employer]ME (Farxiga ) gave consent to enrollment thru 06/14/2025.

## 2024-06-09 ENCOUNTER — Ambulatory Visit: Payer: Self-pay | Admitting: Cardiology

## 2024-07-01 ENCOUNTER — Ambulatory Visit

## 2024-07-03 ENCOUNTER — Inpatient Hospital Stay: Attending: Oncology

## 2024-07-03 ENCOUNTER — Inpatient Hospital Stay: Admitting: Oncology

## 2024-07-03 VITALS — BP 127/56 | HR 74 | Temp 98.1°F | Resp 18 | Ht 66.0 in | Wt 164.9 lb

## 2024-07-03 DIAGNOSIS — D696 Thrombocytopenia, unspecified: Secondary | ICD-10-CM

## 2024-07-03 LAB — CBC WITH DIFFERENTIAL (CANCER CENTER ONLY)
Abs Immature Granulocytes: 0.44 K/uL — ABNORMAL HIGH (ref 0.00–0.07)
Basophils Absolute: 0 K/uL (ref 0.0–0.1)
Basophils Relative: 0 %
Eosinophils Absolute: 0 K/uL (ref 0.0–0.5)
Eosinophils Relative: 0 %
HCT: 39.7 % (ref 39.0–52.0)
Hemoglobin: 12.6 g/dL — ABNORMAL LOW (ref 13.0–17.0)
Immature Granulocytes: 3 %
Lymphocytes Relative: 14 %
Lymphs Abs: 1.8 K/uL (ref 0.7–4.0)
MCH: 29.4 pg (ref 26.0–34.0)
MCHC: 31.7 g/dL (ref 30.0–36.0)
MCV: 92.8 fL (ref 80.0–100.0)
Monocytes Absolute: 4.7 K/uL — ABNORMAL HIGH (ref 0.1–1.0)
Monocytes Relative: 36 %
Neutro Abs: 6 K/uL (ref 1.7–7.7)
Neutrophils Relative %: 47 %
Platelet Count: 88 K/uL — ABNORMAL LOW (ref 150–400)
RBC: 4.28 MIL/uL (ref 4.22–5.81)
RDW: 14.7 % (ref 11.5–15.5)
Smear Review: NORMAL
WBC Count: 13 K/uL — ABNORMAL HIGH (ref 4.0–10.5)
nRBC: 0 % (ref 0.0–0.2)

## 2024-07-03 LAB — FERRITIN: Ferritin: 105 ng/mL (ref 24–336)

## 2024-07-03 LAB — LACTATE DEHYDROGENASE: LDH: 255 U/L — ABNORMAL HIGH (ref 105–235)

## 2024-07-03 NOTE — Progress Notes (Signed)
" °  Jonathan [name] OFFICE PROGRESS NOTE   Diagnosis: Anemia/thrombocytopenia  INTERVAL HISTORY:   Jonathan [name] returns as scheduled.  He has chronic back and knee pain.  He reports fatigue.  No dyspnea.  He has bruising over the arms and intermittent nosebleeding.  No other bleeding.  He has been referred to a pain clinic in [place].  Objective:  Vital signs in last 24 hours:  Blood pressure (!) 127/56, pulse 74, temperature 98.1 F (36.7 C), temperature source Temporal, resp. rate 18, height 5' 6 (1.676 m), weight 164 lb 14.4 oz (74.8 kg), SpO2 98%.     Lymphatics: No cervical, supraclavicular, axillary, or inguinal nodes Resp: Distant breath sounds, no respiratory distress Cardio: Regular rate and rhythm GI: No hepatosplenomegaly Vascular: No leg edema  Skin: Ecchymoses over the forearm bilaterally   Lab Results:  Lab Results  Component Value Date   WBC 13.0 (H) 07/03/2024   HGB 12.6 (L) 07/03/2024   HCT 39.7 07/03/2024   MCV 92.8 07/03/2024   PLT 88 (L) 07/03/2024   NEUTROABS PENDING 07/03/2024    CMP  Lab Results  Component Value Date   NA 143 03/14/2024   K 3.6 03/14/2024   CL 107 03/14/2024   CO2 24 03/14/2024   GLUCOSE 144 (H) 03/14/2024   BUN 22 03/14/2024   CREATININE 1.50 (H) 03/14/2024   CALCIUM  9.3 03/14/2024   PROT 5.9 (L) 03/14/2024   ALBUMIN  4.1 03/14/2024   AST 30 03/14/2024   ALT 37 03/14/2024   ALKPHOS 95 03/14/2024   BILITOT 0.5 03/14/2024   GFRNONAA 45 (L) 03/14/2024   GFRAA >60 03/14/2020    Medications: I have reviewed the patient's current medications.   Assessment/Plan: Thrombocytopenia-chronic 03/07/2024 bone marrow biopsy: Hypercellular marrow with myeloid hyperplasia, megakaryocytic hyperplasia with dyspoiesis, and erythroid hypoplasia decreased iron stores, no ring sideroblasts, no increase in blasts flow cytometry-no monoclonal B-cell or abnormal T-cell population Bone marrow MDS FISH panel negative, bone  marrow myeloid panel, NRAS G 12D, TET2   Atrial fibrillation-apixaban  discontinued May 2023 due to recurrent falls/syncope Hypertension Fibromyalgia Diabetes Hypothyroidism Anxiety Gout Neuropathy Sleep apnea-CPAP Glaucoma Mild elevation of monocyte count Small cutaneous cyst at the perineum Left facial abscess, status post I&D March 2024 Mild normocytic anemia-chronic Non-Alzheimer dementia COPD Depression       Disposition: Jonathan [name] appears stable.  The hemoglobin and platelet count are higher compared to when he was here in September.  He has persistent mild monocytosis.  I suspect he has myelodysplasia.  The plan is to continue observation.  He will call for increased bleeding or new symptoms.  He will return for an office and lab visit in 6 months.  Jonathan Hof, MD  07/03/2024  10:53 AM   "

## 2024-07-04 ENCOUNTER — Ambulatory Visit

## 2024-07-04 DIAGNOSIS — R55 Syncope and collapse: Secondary | ICD-10-CM

## 2024-07-05 LAB — CUP PACEART REMOTE DEVICE CHECK
Date Time Interrogation Session: 20260119232024
Implantable Pulse Generator Implant Date: 20230612

## 2024-07-06 ENCOUNTER — Ambulatory Visit: Payer: Self-pay | Admitting: Cardiology

## 2024-07-07 NOTE — Progress Notes (Signed)
 Remote Loop Recorder Transmission

## 2024-07-10 ENCOUNTER — Other Ambulatory Visit: Payer: Self-pay | Admitting: Internal Medicine

## 2024-07-19 ENCOUNTER — Other Ambulatory Visit: Payer: Self-pay | Admitting: Internal Medicine

## 2024-08-01 ENCOUNTER — Ambulatory Visit

## 2024-08-04 ENCOUNTER — Ambulatory Visit

## 2024-08-14 ENCOUNTER — Ambulatory Visit: Admitting: Adult Health

## 2024-08-21 ENCOUNTER — Ambulatory Visit: Admitting: Cardiology

## 2024-09-04 ENCOUNTER — Ambulatory Visit

## 2024-10-05 ENCOUNTER — Ambulatory Visit

## 2024-11-05 ENCOUNTER — Ambulatory Visit

## 2024-12-06 ENCOUNTER — Ambulatory Visit

## 2024-12-08 ENCOUNTER — Ambulatory Visit

## 2025-01-02 ENCOUNTER — Inpatient Hospital Stay

## 2025-01-02 ENCOUNTER — Inpatient Hospital Stay: Admitting: Oncology

## 2025-01-06 ENCOUNTER — Ambulatory Visit

## 2025-02-06 ENCOUNTER — Ambulatory Visit

## 2025-03-09 ENCOUNTER — Ambulatory Visit

## 2025-04-09 ENCOUNTER — Ambulatory Visit

## 2025-05-10 ENCOUNTER — Ambulatory Visit

## 2025-06-10 ENCOUNTER — Ambulatory Visit

## 2025-07-03 ENCOUNTER — Inpatient Hospital Stay

## 2025-07-03 ENCOUNTER — Inpatient Hospital Stay: Admitting: Oncology

## 2025-07-11 ENCOUNTER — Ambulatory Visit
